# Patient Record
Sex: Female | Born: 1945
Health system: Southern US, Community
[De-identification: ages and names within clinical notes are randomized; demographics above are authoritative.]

## PROBLEM LIST (undated history)

## (undated) DIAGNOSIS — R112 Nausea with vomiting, unspecified: Secondary | ICD-10-CM

## (undated) DIAGNOSIS — I10 Essential (primary) hypertension: Secondary | ICD-10-CM

## (undated) DIAGNOSIS — R011 Cardiac murmur, unspecified: Secondary | ICD-10-CM

## (undated) DIAGNOSIS — I2510 Atherosclerotic heart disease of native coronary artery without angina pectoris: Secondary | ICD-10-CM

## (undated) DIAGNOSIS — F909 Attention-deficit hyperactivity disorder, unspecified type: Secondary | ICD-10-CM

## (undated) DIAGNOSIS — IMO0001 Reserved for inherently not codable concepts without codable children: Secondary | ICD-10-CM

## (undated) DIAGNOSIS — F419 Anxiety disorder, unspecified: Secondary | ICD-10-CM

## (undated) DIAGNOSIS — E785 Hyperlipidemia, unspecified: Secondary | ICD-10-CM

## (undated) DIAGNOSIS — Z9889 Other specified postprocedural states: Secondary | ICD-10-CM

## (undated) DIAGNOSIS — M1990 Unspecified osteoarthritis, unspecified site: Secondary | ICD-10-CM

## (undated) DIAGNOSIS — J45909 Unspecified asthma, uncomplicated: Secondary | ICD-10-CM

## (undated) DIAGNOSIS — G4730 Sleep apnea, unspecified: Secondary | ICD-10-CM

## (undated) DIAGNOSIS — H349 Unspecified retinal vascular occlusion: Secondary | ICD-10-CM

## (undated) DIAGNOSIS — I6529 Occlusion and stenosis of unspecified carotid artery: Secondary | ICD-10-CM

## (undated) DIAGNOSIS — H269 Unspecified cataract: Secondary | ICD-10-CM

## (undated) DIAGNOSIS — G709 Myoneural disorder, unspecified: Secondary | ICD-10-CM

## (undated) DIAGNOSIS — E042 Nontoxic multinodular goiter: Secondary | ICD-10-CM

## (undated) DIAGNOSIS — Z794 Long term (current) use of insulin: Secondary | ICD-10-CM

## (undated) DIAGNOSIS — K279 Peptic ulcer, site unspecified, unspecified as acute or chronic, without hemorrhage or perforation: Secondary | ICD-10-CM

## (undated) DIAGNOSIS — C4491 Basal cell carcinoma of skin, unspecified: Secondary | ICD-10-CM

## (undated) DIAGNOSIS — E119 Type 2 diabetes mellitus without complications: Secondary | ICD-10-CM

## (undated) DIAGNOSIS — D649 Anemia, unspecified: Secondary | ICD-10-CM

## (undated) DIAGNOSIS — I350 Nonrheumatic aortic (valve) stenosis: Secondary | ICD-10-CM

## (undated) DIAGNOSIS — E7800 Pure hypercholesterolemia, unspecified: Secondary | ICD-10-CM

## (undated) HISTORY — PX: ESOPHAGOGASTRODUODENOSCOPY: SHX1529

## (undated) HISTORY — PX: CATARACT EXTRACTION W/ INTRAOCULAR LENS  IMPLANT, BILATERAL: SHX1307

## (undated) HISTORY — PX: CORONARY ANGIOPLASTY: SHX604

## (undated) HISTORY — PX: EYE SURGERY: SHX253

## (undated) HISTORY — PX: CORONARY STENT PLACEMENT: SHX1402

## (undated) HISTORY — PX: COLONOSCOPY: SHX174

## (undated) HISTORY — PX: PARTIAL HYSTERECTOMY: SHX80

## (undated) HISTORY — PX: ABDOMINAL HYSTERECTOMY: SHX81

## (undated) HISTORY — PX: TRIGGER FINGER RELEASE: SHX641

## (undated) HISTORY — PX: BREAST EXCISIONAL BIOPSY: SUR124

---
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
## 2000-12-27 HISTORY — PX: BREAST BIOPSY: SHX20

## 2010-01-24 ENCOUNTER — Emergency Department: Payer: Self-pay | Admitting: Emergency Medicine

## 2010-03-04 ENCOUNTER — Ambulatory Visit: Payer: Self-pay | Admitting: Internal Medicine

## 2010-03-10 ENCOUNTER — Ambulatory Visit: Payer: Self-pay | Admitting: Internal Medicine

## 2010-03-20 ENCOUNTER — Emergency Department: Payer: Self-pay | Admitting: Emergency Medicine

## 2010-04-06 ENCOUNTER — Emergency Department (HOSPITAL_COMMUNITY): Admission: EM | Admit: 2010-04-06 | Discharge: 2010-04-07 | Payer: Self-pay | Admitting: Emergency Medicine

## 2010-08-24 ENCOUNTER — Ambulatory Visit: Payer: Self-pay | Admitting: Gastroenterology

## 2010-08-26 LAB — PATHOLOGY REPORT

## 2011-05-11 ENCOUNTER — Ambulatory Visit: Payer: Self-pay | Admitting: Ophthalmology

## 2011-09-27 ENCOUNTER — Ambulatory Visit: Payer: Self-pay | Admitting: Internal Medicine

## 2011-10-13 ENCOUNTER — Ambulatory Visit: Payer: Self-pay | Admitting: Internal Medicine

## 2012-06-06 ENCOUNTER — Ambulatory Visit: Payer: Self-pay | Admitting: Cardiology

## 2012-06-06 DIAGNOSIS — I219 Acute myocardial infarction, unspecified: Secondary | ICD-10-CM

## 2012-06-06 LAB — CK TOTAL AND CKMB (NOT AT ARMC): CK, Total: 79 U/L (ref 21–215)

## 2012-06-07 LAB — BASIC METABOLIC PANEL
Anion Gap: 8 (ref 7–16)
BUN: 11 mg/dL (ref 7–18)
Calcium, Total: 8.3 mg/dL — ABNORMAL LOW (ref 8.5–10.1)
EGFR (African American): >60
Glucose: 169 mg/dL — ABNORMAL HIGH (ref 65–99)
Osmolality: 285 (ref 275–301)
Potassium: 4.2 mmol/L (ref 3.5–5.1)
Sodium: 141 mmol/L (ref 136–145)

## 2012-11-03 ENCOUNTER — Ambulatory Visit: Payer: Self-pay | Admitting: Family Medicine

## 2012-11-30 LAB — COMPREHENSIVE METABOLIC PANEL
BUN: 28 mg/dL — ABNORMAL HIGH (ref 7–18)
Calcium, Total: 8.7 mg/dL (ref 8.5–10.1)
EGFR (African American): >60
Glucose: 195 mg/dL — ABNORMAL HIGH (ref 65–99)
Potassium: 3.9 mmol/L (ref 3.5–5.1)
SGOT(AST): 23 U/L (ref 15–37)
SGPT (ALT): 21 U/L (ref 12–78)
Total Protein: 7.4 g/dL (ref 6.4–8.2)

## 2012-11-30 LAB — CBC
HCT: 25.9 % — ABNORMAL LOW (ref 35.0–47.0)
MCHC: 32.1 g/dL (ref 32.0–36.0)
Platelet: 331 10*3/uL (ref 150–440)
RDW: 19.9 % — ABNORMAL HIGH (ref 11.5–14.5)
WBC: 8.2 10*3/uL (ref 3.6–11.0)

## 2012-12-01 ENCOUNTER — Inpatient Hospital Stay: Payer: Self-pay | Admitting: Specialist

## 2012-12-01 LAB — BASIC METABOLIC PANEL
Calcium, Total: 8.8 mg/dL (ref 8.5–10.1)
Chloride: 104 mmol/L (ref 98–107)
Co2: 29 mmol/L (ref 21–32)
EGFR (Non-African Amer.): >60
Glucose: 130 mg/dL — ABNORMAL HIGH (ref 65–99)
Osmolality: 283 (ref 275–301)
Potassium: 3.6 mmol/L (ref 3.5–5.1)
Sodium: 139 mmol/L (ref 136–145)

## 2012-12-01 LAB — CBC WITH DIFFERENTIAL/PLATELET
Basophil #: 0.1 10*3/uL (ref 0.0–0.1)
Basophil #: 0 10*3/uL (ref 0.0–0.1)
Basophil %: 0.9 %
Eosinophil #: 0.4 10*3/uL (ref 0.0–0.7)
HCT: 24.9 % — ABNORMAL LOW (ref 35.0–47.0)
HGB: 8 g/dL — ABNORMAL LOW (ref 12.0–16.0)
Lymphocyte #: 1.7 10*3/uL (ref 1.0–3.6)
Lymphocyte #: 1.1 10*3/uL (ref 1.0–3.6)
MCH: 27.4 pg (ref 26.0–34.0)
MCH: 27 pg (ref 26.0–34.0)
MCHC: 32.3 g/dL (ref 32.0–36.0)
MCV: 85 fL (ref 80–100)
MCV: 85 fL (ref 80–100)
Monocyte #: 0.6 x10 3/mm (ref 0.2–0.9)
Monocyte #: 0.4 x10 3/mm (ref 0.2–0.9)
Neutrophil #: 3.7 10*3/uL (ref 1.4–6.5)
Platelet: 296 10*3/uL (ref 150–440)
RDW: 19.7 % — ABNORMAL HIGH (ref 11.5–14.5)
RDW: 19.5 % — ABNORMAL HIGH (ref 11.5–14.5)

## 2012-12-01 LAB — PROTIME-INR: Prothrombin Time: 14 secs (ref 11.5–14.7)

## 2012-12-01 LAB — TROPONIN I
Troponin-I: 0.1 ng/mL — ABNORMAL HIGH
Troponin-I: 0.07 ng/mL — ABNORMAL HIGH

## 2012-12-01 LAB — IRON AND TIBC
Iron Saturation: 6 %
Unbound Iron-Bind.Cap.: 310 ug/dL

## 2012-12-01 LAB — OCCULT BLOOD X 1 CARD TO LAB, STOOL: Occult Blood, Feces: POSITIVE

## 2012-12-02 LAB — CBC WITH DIFFERENTIAL/PLATELET
Basophil #: 0.1 10*3/uL (ref 0.0–0.1)
Basophil %: 1 %
Eosinophil #: 0.5 10*3/uL (ref 0.0–0.7)
HGB: 9.8 g/dL — ABNORMAL LOW (ref 12.0–16.0)
Lymphocyte #: 1.5 10*3/uL (ref 1.0–3.6)
Lymphocyte %: 25.6 %
MCH: 28.3 pg (ref 26.0–34.0)
Monocyte %: 9.8 %
Neutrophil #: 3.2 10*3/uL (ref 1.4–6.5)
Neutrophil %: 55.2 %
Platelet: 273 10*3/uL (ref 150–440)
RBC: 3.45 10*6/uL — ABNORMAL LOW (ref 3.80–5.20)
WBC: 5.7 10*3/uL (ref 3.6–11.0)

## 2012-12-03 LAB — CBC WITH DIFFERENTIAL/PLATELET
Eosinophil %: 9.8 %
Lymphocyte #: 1.6 10*3/uL (ref 1.0–3.6)
Lymphocyte %: 33.7 %
MCV: 86 fL (ref 80–100)
Monocyte #: 0.5 x10 3/mm (ref 0.2–0.9)
Monocyte %: 10.2 %
Neutrophil #: 2.2 10*3/uL (ref 1.4–6.5)
Platelet: 256 10*3/uL (ref 150–440)
RBC: 3.56 10*6/uL — ABNORMAL LOW (ref 3.80–5.20)
WBC: 4.8 10*3/uL (ref 3.6–11.0)

## 2012-12-18 ENCOUNTER — Ambulatory Visit: Payer: Self-pay | Admitting: Family Medicine

## 2013-01-09 ENCOUNTER — Observation Stay: Payer: Self-pay | Admitting: Cardiology

## 2013-01-09 LAB — BASIC METABOLIC PANEL
Anion Gap: 4 — ABNORMAL LOW (ref 7–16)
BUN: 13 mg/dL (ref 7–18)
Chloride: 105 mmol/L (ref 98–107)
Co2: 29 mmol/L (ref 21–32)
Creatinine: 0.75 mg/dL (ref 0.60–1.30)
EGFR (African American): >60
EGFR (Non-African Amer.): >60
Glucose: 174 mg/dL — ABNORMAL HIGH (ref 65–99)
Osmolality: 280 (ref 275–301)
Potassium: 4 mmol/L (ref 3.5–5.1)

## 2013-01-09 LAB — CBC
HCT: 28 % — ABNORMAL LOW (ref 35.0–47.0)
MCH: 25 pg — ABNORMAL LOW (ref 26.0–34.0)
MCV: 83 fL (ref 80–100)
Platelet: 305 10*3/uL (ref 150–440)
RBC: 3.38 10*6/uL — ABNORMAL LOW (ref 3.80–5.20)
RDW: 21.1 % — ABNORMAL HIGH (ref 11.5–14.5)

## 2013-01-09 LAB — IRON AND TIBC
Iron Bind.Cap.(Total): 339 ug/dL (ref 250–450)
Iron: 18 ug/dL — ABNORMAL LOW (ref 50–170)
Unbound Iron-Bind.Cap.: 321 ug/dL

## 2013-01-09 LAB — TROPONIN I
Troponin-I: <0.02 ng/mL
Troponin-I: <0.02 ng/mL

## 2013-01-09 LAB — COMPREHENSIVE METABOLIC PANEL
Albumin: 3.7 g/dL (ref 3.4–5.0)
Alkaline Phosphatase: 91 U/L (ref 50–136)
Bilirubin,Total: 0.4 mg/dL (ref 0.2–1.0)
SGOT(AST): 15 U/L (ref 15–37)
SGPT (ALT): 18 U/L (ref 12–78)
Total Protein: 7.8 g/dL (ref 6.4–8.2)

## 2013-01-09 LAB — CK TOTAL AND CKMB (NOT AT ARMC)
CK, Total: 125 U/L (ref 21–215)
CK-MB: 1 ng/mL (ref 0.5–3.6)

## 2013-01-09 LAB — PROTIME-INR: INR: 0.9

## 2013-01-10 LAB — CBC WITH DIFFERENTIAL/PLATELET
Eosinophil %: 9.7 %
HCT: 26.7 % — ABNORMAL LOW (ref 35.0–47.0)
HGB: 8.7 g/dL — ABNORMAL LOW (ref 12.0–16.0)
Lymphocyte %: 27.6 %
MCH: 26.6 pg (ref 26.0–34.0)
MCHC: 32.4 g/dL (ref 32.0–36.0)
Monocyte #: 0.6 x10 3/mm (ref 0.2–0.9)
Monocyte %: 11.9 %
Neutrophil #: 2.3 10*3/uL (ref 1.4–6.5)
Platelet: 286 10*3/uL (ref 150–440)
WBC: 4.7 10*3/uL (ref 3.6–11.0)

## 2013-01-10 LAB — TROPONIN I: Troponin-I: <0.02 ng/mL

## 2013-02-12 ENCOUNTER — Ambulatory Visit: Payer: Self-pay | Admitting: Internal Medicine

## 2013-02-12 LAB — CBC CANCER CENTER
Basophil #: 0.1 x10 3/mm (ref 0.0–0.1)
Basophil %: 1.3 %
Basophil: 2 %
Eosinophil #: 0.3 x10 3/mm (ref 0.0–0.7)
Eosinophil %: 4.8 %
Eosinophil: 3 %
HGB: 9.4 g/dL — ABNORMAL LOW (ref 12.0–16.0)
Lymphocyte #: 1.6 x10 3/mm (ref 1.0–3.6)
Lymphocyte %: 26.1 %
Lymphocytes: 21 %
MCH: 25 pg — ABNORMAL LOW (ref 26.0–34.0)
MCV: 79 fL — ABNORMAL LOW (ref 80–100)
Monocyte #: 0.5 x10 3/mm (ref 0.2–0.9)
Monocyte %: 7.9 %
Neutrophil %: 59.9 %
Platelet: 342 x10 3/mm (ref 150–440)
RBC: 3.75 10*6/uL — ABNORMAL LOW (ref 3.80–5.20)
RDW: 16.7 % — ABNORMAL HIGH (ref 11.5–14.5)
Segmented Neutrophils: 62 %
Variant Lymphocyte: 4 %

## 2013-02-12 LAB — IRON AND TIBC
Iron Bind.Cap.(Total): 331 ug/dL (ref 250–450)
Iron Saturation: 6 %
Iron: 21 ug/dL — ABNORMAL LOW (ref 50–170)
Unbound Iron-Bind.Cap.: 310 ug/dL

## 2013-02-12 LAB — LACTATE DEHYDROGENASE: LDH: 180 U/L (ref 81–246)

## 2013-02-12 LAB — RETICULOCYTES: Absolute Retic Count: 0.0394 10*6/uL (ref 0.023–0.096)

## 2013-02-15 LAB — URINE IEP, RANDOM

## 2013-02-24 ENCOUNTER — Ambulatory Visit: Payer: Self-pay | Admitting: Internal Medicine

## 2013-03-27 ENCOUNTER — Ambulatory Visit: Payer: Self-pay | Admitting: Internal Medicine

## 2013-04-02 ENCOUNTER — Inpatient Hospital Stay: Payer: Self-pay | Admitting: Internal Medicine

## 2013-04-02 LAB — BASIC METABOLIC PANEL
BUN: 14 mg/dL (ref 7–18)
Calcium, Total: 8.8 mg/dL (ref 8.5–10.1)
Co2: 29 mmol/L (ref 21–32)
Creatinine: 0.58 mg/dL — ABNORMAL LOW (ref 0.60–1.30)
EGFR (African American): >60
EGFR (Non-African Amer.): >60
Glucose: 114 mg/dL — ABNORMAL HIGH (ref 65–99)
Osmolality: 281 (ref 275–301)
Potassium: 3.9 mmol/L (ref 3.5–5.1)
Sodium: 140 mmol/L (ref 136–145)

## 2013-04-02 LAB — PROTIME-INR
INR: 1
Prothrombin Time: 13.4 secs (ref 11.5–14.7)

## 2013-04-02 LAB — CBC
HCT: 24.7 % — ABNORMAL LOW (ref 35.0–47.0)
HGB: 7.8 g/dL — ABNORMAL LOW (ref 12.0–16.0)
MCH: 28.4 pg (ref 26.0–34.0)
MCHC: 31.7 g/dL — ABNORMAL LOW (ref 32.0–36.0)
MCV: 90 fL (ref 80–100)
Platelet: 337 10*3/uL (ref 150–440)

## 2013-04-02 LAB — HEMOGLOBIN: HGB: 7.4 g/dL — ABNORMAL LOW (ref 12.0–16.0)

## 2013-04-02 LAB — HEPATIC FUNCTION PANEL A (ARMC)
Albumin: 3.8 g/dL (ref 3.4–5.0)
Alkaline Phosphatase: 99 U/L (ref 50–136)
Bilirubin,Total: 0.3 mg/dL (ref 0.2–1.0)
SGOT(AST): 22 U/L (ref 15–37)
SGPT (ALT): 23 U/L (ref 12–78)
Total Protein: 6.8 g/dL (ref 6.4–8.2)

## 2013-04-02 LAB — TROPONIN I: Troponin-I: <0.02 ng/mL

## 2013-04-03 LAB — URINALYSIS, COMPLETE
Bilirubin,UR: NEGATIVE
Glucose,UR: NEGATIVE mg/dL (ref 0–75)
Ketone: NEGATIVE
Ph: 6 (ref 4.5–8.0)
Specific Gravity: 1.016 (ref 1.003–1.030)
Squamous Epithelial: 1

## 2013-04-03 LAB — CBC WITH DIFFERENTIAL/PLATELET
Eosinophil #: 0.3 10*3/uL (ref 0.0–0.7)
HCT: 22.2 % — ABNORMAL LOW (ref 35.0–47.0)
HGB: 7.2 g/dL — ABNORMAL LOW (ref 12.0–16.0)
Lymphocyte #: 1.2 10*3/uL (ref 1.0–3.6)
Lymphocyte %: 28.6 %
MCH: 29.1 pg (ref 26.0–34.0)
MCHC: 32.4 g/dL (ref 32.0–36.0)
MCV: 90 fL (ref 80–100)
Monocyte %: 9.7 %
Neutrophil %: 54.6 %
Platelet: 288 10*3/uL (ref 150–440)
RDW: 17.1 % — ABNORMAL HIGH (ref 11.5–14.5)

## 2013-04-03 LAB — BASIC METABOLIC PANEL
Anion Gap: 4 — ABNORMAL LOW (ref 7–16)
Calcium, Total: 8 mg/dL — ABNORMAL LOW (ref 8.5–10.1)
Creatinine: 0.78 mg/dL (ref 0.60–1.30)
Glucose: 88 mg/dL (ref 65–99)
Osmolality: 280 (ref 275–301)

## 2013-04-03 LAB — IRON AND TIBC
Iron Bind.Cap.(Total): 288 ug/dL (ref 250–450)
Iron Saturation: 9 %
Unbound Iron-Bind.Cap.: 262 ug/dL

## 2013-04-03 LAB — FOLATE: Folic Acid: 43.5 ng/mL (ref 3.1–100.0)

## 2013-04-03 LAB — RETICULOCYTES
Absolute Retic Count: 0.1618 10*6/uL
Reticulocyte: 6.2 % — ABNORMAL HIGH

## 2013-04-04 LAB — HEMOGLOBIN: HGB: 9.4 g/dL — ABNORMAL LOW (ref 12.0–16.0)

## 2013-04-10 ENCOUNTER — Ambulatory Visit: Payer: Self-pay | Admitting: Internal Medicine

## 2013-04-13 ENCOUNTER — Emergency Department (HOSPITAL_COMMUNITY): Payer: Medicare Other

## 2013-04-13 ENCOUNTER — Inpatient Hospital Stay (HOSPITAL_COMMUNITY)
Admission: EM | Admit: 2013-04-13 | Discharge: 2013-04-18 | DRG: 378 | Disposition: A | Discharge status: Home / Self Care | Payer: Medicare Other | Attending: Internal Medicine | Admitting: Internal Medicine

## 2013-04-13 DIAGNOSIS — K922 Gastrointestinal hemorrhage, unspecified: Secondary | ICD-10-CM | POA: Diagnosis present

## 2013-04-13 DIAGNOSIS — IMO0001 Reserved for inherently not codable concepts without codable children: Secondary | ICD-10-CM

## 2013-04-13 DIAGNOSIS — Z888 Allergy status to other drugs, medicaments and biological substances status: Secondary | ICD-10-CM

## 2013-04-13 DIAGNOSIS — I10 Essential (primary) hypertension: Secondary | ICD-10-CM | POA: Diagnosis present

## 2013-04-13 DIAGNOSIS — I2489 Other forms of acute ischemic heart disease: Secondary | ICD-10-CM | POA: Diagnosis present

## 2013-04-13 DIAGNOSIS — Z79899 Other long term (current) drug therapy: Secondary | ICD-10-CM

## 2013-04-13 DIAGNOSIS — I2510 Atherosclerotic heart disease of native coronary artery without angina pectoris: Secondary | ICD-10-CM | POA: Diagnosis present

## 2013-04-13 DIAGNOSIS — I248 Other forms of acute ischemic heart disease: Secondary | ICD-10-CM | POA: Diagnosis present

## 2013-04-13 DIAGNOSIS — Z794 Long term (current) use of insulin: Secondary | ICD-10-CM

## 2013-04-13 DIAGNOSIS — D649 Anemia, unspecified: Secondary | ICD-10-CM

## 2013-04-13 DIAGNOSIS — R079 Chest pain, unspecified: Secondary | ICD-10-CM | POA: Diagnosis present

## 2013-04-13 DIAGNOSIS — I209 Angina pectoris, unspecified: Secondary | ICD-10-CM

## 2013-04-13 DIAGNOSIS — Z882 Allergy status to sulfonamides status: Secondary | ICD-10-CM

## 2013-04-13 DIAGNOSIS — E119 Type 2 diabetes mellitus without complications: Secondary | ICD-10-CM | POA: Diagnosis present

## 2013-04-13 DIAGNOSIS — Z880 Allergy status to penicillin: Secondary | ICD-10-CM

## 2013-04-13 DIAGNOSIS — D500 Iron deficiency anemia secondary to blood loss (chronic): Secondary | ICD-10-CM | POA: Diagnosis present

## 2013-04-13 DIAGNOSIS — Z9861 Coronary angioplasty status: Secondary | ICD-10-CM

## 2013-04-13 DIAGNOSIS — E7800 Pure hypercholesterolemia, unspecified: Secondary | ICD-10-CM | POA: Diagnosis present

## 2013-04-13 HISTORY — DX: Type 2 diabetes mellitus without complications: E11.9

## 2013-04-13 HISTORY — DX: Pure hypercholesterolemia, unspecified: E78.00

## 2013-04-13 HISTORY — DX: Long term (current) use of insulin: Z79.4

## 2013-04-13 HISTORY — DX: Reserved for inherently not codable concepts without codable children: IMO0001

## 2013-04-13 HISTORY — DX: Nontoxic multinodular goiter: E04.2

## 2013-04-13 HISTORY — DX: Essential (primary) hypertension: I10

## 2013-04-13 HISTORY — DX: Atherosclerotic heart disease of native coronary artery without angina pectoris: I25.10

## 2013-04-13 LAB — CBC WITH DIFFERENTIAL/PLATELET
Basophils Relative: 1 % (ref 0–1)
Eosinophils Absolute: 0.3 10*3/uL (ref 0.0–0.7)
MCH: 29.9 pg (ref 26.0–34.0)
MCHC: 32.9 g/dL (ref 30.0–36.0)
Neutrophils Relative %: 60 % (ref 43–77)
Platelets: 279 10*3/uL (ref 150–400)

## 2013-04-13 LAB — COMPREHENSIVE METABOLIC PANEL
ALT: 14 U/L (ref 0–35)
Albumin: 3.2 g/dL — ABNORMAL LOW (ref 3.5–5.2)
Alkaline Phosphatase: 78 U/L (ref 39–117)
BUN: 18 mg/dL (ref 6–23)
Potassium: 3.9 mEq/L (ref 3.5–5.1)
Sodium: 141 mEq/L (ref 135–145)
Total Protein: 6.3 g/dL (ref 6.0–8.3)

## 2013-04-13 NOTE — ED Notes (Signed)
Pt st's she started having upper chest pain radiating into both arms at 20:00.  Pt also st's she is anemic and they have not found were the blood loss is coming from.  St's on Wed. Hgb was 8.  Pt denies any nausea, or vomiting. Color pale.

## 2013-04-13 NOTE — ED Provider Notes (Addendum)
History     CSN: [id number]  Arrival date & time 04/13/13  2221   First MD Initiated Contact with Patient 04/13/13 2257      Chief Complaint  Patient presents with  . Chest Pain    (Consider location/radiation/quality/duration/timing/severity/associated sxs/prior treatment) HPI Comments: Pt with hx of iddm, acute anemia in the recent past requiring transfusion, and negative EGD and Colonoscopy, getting treated with iron injections, comes in with cc of chest pain. Pt has a substernal chest pain that started this evening with exertion. There is no associated nausea, diaphoresis. Pt also has some exertional dyspnea and dizziness. No fevers, chills cough. Has hx of recent transfusion. Denies gi bleed, and had colonoscopy and EGD that were negative during that admission.   The history is provided by the patient.    No past medical history on file.  No past surgical history on file.  No family history on file.  History  Substance Use Topics  . Smoking status: Not on file  . Smokeless tobacco: Not on file  . Alcohol Use: Not on file    OB History   No data available      Review of Systems  Constitutional: Positive for fatigue. Negative for activity change.  HENT: Negative for facial swelling and neck pain.   Respiratory: Positive for shortness of breath. Negative for cough and wheezing.   Cardiovascular: Positive for chest pain.  Gastrointestinal: Negative for nausea, vomiting, abdominal pain, diarrhea, constipation, blood in stool and abdominal distention.  Genitourinary: Negative for dysuria, hematuria and difficulty urinating.  Skin: Negative for color change.  Neurological: Positive for dizziness. Negative for speech difficulty and headaches.  Hematological: Does not bruise/bleed easily.  Psychiatric/Behavioral: Negative for confusion.    Allergies  Review of patient's allergies indicates not on file.  Home Medications   Current Outpatient Rx  Name  Route  Sig   Dispense  Refill  . ascorbic acid (VITAMIN C) 500 MG tablet   Oral   Take 500 mg by mouth daily.         Marland Kitchen aspirin EC 81 MG tablet   Oral   Take 81 mg by mouth daily.         . ferrous sulfate 325 (65 FE) MG tablet   Oral   Take 325 mg by mouth daily with breakfast.         . GARLIC PO   Oral   Take 1 capsule by mouth daily.         . insulin glargine (LANTUS) 100 UNIT/ML injection   Subcutaneous   Inject into the skin at bedtime.         . metoprolol tartrate (LOPRESSOR) 25 MG tablet   Oral   Take 25 mg by mouth 2 (two) times daily.         . pantoprazole (PROTONIX) 40 MG tablet   Oral   Take 40 mg by mouth daily.         . pravastatin (PRAVACHOL) 40 MG tablet   Oral   Take 40 mg by mouth daily.         . promethazine (PHENERGAN) 25 MG tablet   Oral   Take 25 mg by mouth every 6 (six) hours as needed for nausea.         Marland Kitchen senna (SENOKOT) 8.6 MG TABS   Oral   Take 1 tablet by mouth daily.           BP 183/69  Pulse 84  Temp(Src) 98.2 F (36.8 C) (Oral)  Resp 16  Ht 5\' 3"  (1.6 m)  Wt 203 lb (92.08 kg)  BMI 35.97 kg/m2  SpO2 100%  Physical Exam  Nursing note and vitals reviewed. Constitutional: She is oriented to person, place, and time. She appears well-developed and well-nourished.  HENT:  Head: Normocephalic and atraumatic.  Eyes: EOM are normal. Pupils are equal, round, and reactive to light. No scleral icterus.  Neck: Neck supple. No JVD present.  Cardiovascular: Normal rate and regular rhythm.   Murmur heard. Pulmonary/Chest: Effort normal. No respiratory distress.  Abdominal: Soft. She exhibits no distension. There is no tenderness. There is no rebound and no guarding.  Neurological: She is alert and oriented to person, place, and time.  Skin: Skin is warm and dry.    ED Course  Procedures (including critical care time)  Labs Reviewed  CBC WITH DIFFERENTIAL  COMPREHENSIVE METABOLIC PANEL   No results found.   No  diagnosis found.    MDM   Date: 04/13/2013  Rate: 88  Rhythm: normal sinus rhythm  QRS Axis: normal  Intervals: normal  ST/T Wave abnormalities: nonspecific ST/T changes  Conduction Disutrbances:none  Narrative Interpretation:   Old EKG Reviewed: none available  Differential diagnosis includes: ACS syndrome CHF exacerbation Valvular disorder Myocarditis Pericarditis Pericardial effusion Pneumonia Pleural effusion Pulmonary edema PE Anemia Musculoskeletal pain   Pt comes in with cc of chest pain. Recent admission for anemia - negative workup at that time on the GI side. She has exertional dyspnea and chest pain. At this time i am thinking that this is a supply related ischemia - if the troponin is elevated. Initial EKG shows no STEMI and she is chest pin free at rest.  Will give ASA, as i dont suspect a GI source with recent negative scopes.   Derwood Kaplan, MD 04/14/13 0110   1:41 AM Symptomatic anemia, with hb < 7. Will start transfusion and admit.   CRITICAL CARE Performed by: Derwood Kaplan   Total critical care time: 31 minutes  Critical care time was exclusive of separately billable procedures and treating other patients.  Critical care was necessary to treat or prevent imminent or life-threatening deterioration.  Critical care was time spent personally by me on the following activities: development of treatment plan with patient and/or surrogate as well as nursing, discussions with consultants, evaluation of patient's response to treatment, examination of patient, obtaining history from patient or surrogate, ordering and performing treatments and interventions, ordering and review of laboratory studies, ordering and review of radiographic studies, pulse oximetry and re-evaluation of patient's condition.   Derwood Kaplan, MD 04/14/13 1610  Derwood Kaplan, MD 04/14/13 9604

## 2013-04-14 ENCOUNTER — Encounter (HOSPITAL_COMMUNITY): Payer: Self-pay | Admitting: Internal Medicine

## 2013-04-14 DIAGNOSIS — IMO0001 Reserved for inherently not codable concepts without codable children: Secondary | ICD-10-CM

## 2013-04-14 DIAGNOSIS — K922 Gastrointestinal hemorrhage, unspecified: Principal | ICD-10-CM

## 2013-04-14 DIAGNOSIS — I10 Essential (primary) hypertension: Secondary | ICD-10-CM | POA: Diagnosis present

## 2013-04-14 DIAGNOSIS — Z794 Long term (current) use of insulin: Secondary | ICD-10-CM

## 2013-04-14 DIAGNOSIS — E119 Type 2 diabetes mellitus without complications: Secondary | ICD-10-CM

## 2013-04-14 DIAGNOSIS — R079 Chest pain, unspecified: Secondary | ICD-10-CM | POA: Diagnosis present

## 2013-04-14 DIAGNOSIS — I209 Angina pectoris, unspecified: Secondary | ICD-10-CM

## 2013-04-14 DIAGNOSIS — I2510 Atherosclerotic heart disease of native coronary artery without angina pectoris: Secondary | ICD-10-CM | POA: Diagnosis present

## 2013-04-14 DIAGNOSIS — D500 Iron deficiency anemia secondary to blood loss (chronic): Secondary | ICD-10-CM

## 2013-04-14 LAB — GLUCOSE, CAPILLARY
Glucose-Capillary: 73 mg/dL (ref 70–99)
Glucose-Capillary: 112 mg/dL — ABNORMAL HIGH (ref 70–99)
Glucose-Capillary: 115 mg/dL — ABNORMAL HIGH (ref 70–99)

## 2013-04-14 LAB — BASIC METABOLIC PANEL
Calcium: 8.3 mg/dL — ABNORMAL LOW (ref 8.4–10.5)
Chloride: 109 mEq/L (ref 96–112)
Creatinine, Ser: 0.56 mg/dL (ref 0.50–1.10)
GFR calc Af Amer: >90 mL/min (ref >90)
Sodium: 142 mEq/L (ref 135–145)

## 2013-04-14 LAB — CBC
MCV: 88.3 fL (ref 78.0–100.0)
Platelets: 241 10*3/uL (ref 150–400)
RDW: 16.9 % — ABNORMAL HIGH (ref 11.5–15.5)
WBC: 6.3 10*3/uL (ref 4.0–10.5)

## 2013-04-14 LAB — TROPONIN I
Troponin I: <0.3 ng/mL (ref <0.30)
Troponin I: <0.3 ng/mL (ref <0.30)

## 2013-04-14 MED ORDER — ACETAMINOPHEN 325 MG PO TABS
650.0000 mg | ORAL_TABLET | Freq: Four times a day (QID) | ORAL | Status: DC | PRN
  Administered 2013-04-14: 650 mg via ORAL
  Filled 2013-04-14: qty 2

## 2013-04-14 MED ORDER — SODIUM CHLORIDE 0.9 % IJ SOLN
3.0000 mL | Freq: Two times a day (BID) | INTRAMUSCULAR | Status: DC
  Administered 2013-04-14 – 2013-04-17 (×6): 3 mL via INTRAVENOUS

## 2013-04-14 MED ORDER — PANTOPRAZOLE SODIUM 40 MG PO TBEC
40.0000 mg | DELAYED_RELEASE_TABLET | Freq: Every day | ORAL | Status: DC
  Administered 2013-04-14 – 2013-04-18 (×5): 40 mg via ORAL
  Filled 2013-04-14 (×5): qty 1

## 2013-04-14 MED ORDER — ASPIRIN 81 MG PO CHEW
162.0000 mg | CHEWABLE_TABLET | Freq: Once | ORAL | Status: AC
  Administered 2013-04-14: 162 mg via ORAL
  Filled 2013-04-14: qty 4

## 2013-04-14 MED ORDER — INSULIN ASPART 100 UNIT/ML ~~LOC~~ SOLN
0.0000 [IU] | SUBCUTANEOUS | Status: DC
Start: 2013-04-14 — End: 2013-04-15
  Administered 2013-04-14: 4 [IU] via SUBCUTANEOUS
  Administered 2013-04-15: 7 [IU] via SUBCUTANEOUS
  Administered 2013-04-15: 3 [IU] via SUBCUTANEOUS
  Filled 2013-04-14: qty 4

## 2013-04-14 MED ORDER — SIMVASTATIN 20 MG PO TABS
20.0000 mg | ORAL_TABLET | Freq: Every day | ORAL | Status: DC
  Administered 2013-04-14 – 2013-04-17 (×4): 20 mg via ORAL
  Filled 2013-04-14 (×5): qty 1

## 2013-04-14 MED ORDER — PROMETHAZINE HCL 25 MG PO TABS: 25.0000 mg | ORAL_TABLET | Freq: Four times a day (QID) | ORAL | Status: DC | PRN

## 2013-04-14 MED ORDER — SODIUM CHLORIDE 0.9 % IV SOLN
INTRAVENOUS | Status: DC
  Administered 2013-04-14: 08:00:00 via INTRAVENOUS

## 2013-04-14 MED ORDER — METOPROLOL TARTRATE 25 MG PO TABS
25.0000 mg | ORAL_TABLET | Freq: Two times a day (BID) | ORAL | Status: DC
  Administered 2013-04-14 – 2013-04-16 (×6): 25 mg via ORAL
  Filled 2013-04-14 (×9): qty 1

## 2013-04-14 MED ORDER — FERROUS SULFATE 325 (65 FE) MG PO TABS
325.0000 mg | ORAL_TABLET | Freq: Every day | ORAL | Status: DC
  Administered 2013-04-14: 325 mg via ORAL
  Filled 2013-04-14 (×4): qty 1

## 2013-04-14 NOTE — H&P (Signed)
[facility] History and Physical  Micheale Schlack VHQ:[id number] DOB: [birth year] DOA: 04/13/2013  Referring physician: ED PCP: No primary provider on file.  Specialists: None  Chief Complaint: Chest pain  HPI: Joyce Potter is a 67 y.o. female h/o IDDM, acute anemia in recent past requiring transfusion with negative EGD, colonoscopy, and capsule study.  Treated with iron therapy, comes in with CC of CP.  Patient also has history of CAD in the past s/p stenting x1 last year.  Chest pain is substernal, worse with exertion, no associated nausea.  Has exertional dyspnea, no fevers or chills.  Does have guiac positive stool in recent past during hospital stay at [facility], but unclear bleeding source.  Today in ED HGB 6.9, patient being transfused 2 units, hospitalist asked to admit.  Review of Systems: 12 systems reviewed and otherwise negative.  Past Medical History  Diagnosis Date  . IDDM (insulin dependent diabetes mellitus)   . High cholesterol   . HTN (hypertension)   . Multiple thyroid nodules     Benign  . CAD (coronary artery disease)    Past Surgical History  Procedure Laterality Date  . Coronary stent placement    . Partial hysterectomy     Social History:  reports that she has never smoked. She does not have any smokeless tobacco history on file. She reports that she does not drink alcohol or use illicit drugs.   Allergies  Allergen Reactions  . Sulfa Antibiotics Other (See Comments)    "Got Drunk"  . Penicillins Rash  . Vicodin (Hydrocodone-Acetaminophen) Rash    Family History  Problem Relation Age of Onset  . Uterine cancer Mother   . Breast cancer Mother   . Colon cancer Neg Hx     Prior to Admission medications   Medication Sig Start Date End Date Taking? Authorizing Provider  ascorbic acid (VITAMIN C) 500 MG tablet Take 500 mg by mouth daily.   Yes Historical Provider, MD  aspirin EC 81 MG tablet Take 81 mg by mouth daily.   Yes Historical  Provider, MD  ferrous sulfate 325 (65 FE) MG tablet Take 325 mg by mouth daily with breakfast.   Yes Historical Provider, MD  GARLIC PO Take 1 capsule by mouth daily.   Yes Historical Provider, MD  insulin glargine (LANTUS) 100 UNIT/ML injection Inject 65 Units into the skin every morning.    Yes Historical Provider, MD  metoprolol tartrate (LOPRESSOR) 25 MG tablet Take 25 mg by mouth 2 (two) times daily.   Yes Historical Provider, MD  pantoprazole (PROTONIX) 40 MG tablet Take 40 mg by mouth daily.   Yes Historical Provider, MD  pravastatin (PRAVACHOL) 40 MG tablet Take 40 mg by mouth daily.   Yes Historical Provider, MD  promethazine (PHENERGAN) 25 MG tablet Take 25 mg by mouth every 6 (six) hours as needed for nausea.   Yes Historical Provider, MD  senna (SENOKOT) 8.6 MG TABS Take 1 tablet by mouth daily.   Yes Historical Provider, MD   Physical Exam: Filed Vitals:   04/13/13 2242 04/14/13 0016 04/14/13 0100  BP: 183/69 131/53 124/50  Pulse: 84 79 76  Temp: 98.2 F (36.8 C)    TempSrc: Oral    Resp: 16 14 14   Height: 5\' 3"  (1.6 m)    Weight: 92.08 kg (203 lb)    SpO2: 100% 98% 97%    General:  NAD, resting comfortably in bed Eyes: PEERLA EOMI ENT: mucous membranes moist Neck: supple w/o JVD Cardiovascular:  RRR murmur present, 3/6 Respiratory: CTA B Abdomen: soft, nt, nd, bs+ Skin: no rash nor lesion Musculoskeletal: MAE, full ROM all 4 extremities Psychiatric: normal tone and affect Neurologic: AAOx3, grossly non-focal  Labs on Admission:  Basic Metabolic Panel:  Recent Labs Lab 04/13/13 2242  NA 141  K 3.9  CL 107  CO2 28  GLUCOSE 169*  BUN 18  CREATININE 0.61  CALCIUM 8.8   Liver Function Tests:  Recent Labs Lab 04/13/13 2242  AST 18  ALT 14  ALKPHOS 78  BILITOT 0.2*  PROT 6.3  ALBUMIN 3.2*   No results found for this basename: LIPASE, AMYLASE,  in the last 168 hours No results found for this basename: AMMONIA,  in the last 168 hours CBC:  Recent  Labs Lab 04/13/13 2242  WBC 6.2  NEUTROABS 3.7  HGB 6.9*  HCT 21.0*  MCV 90.9  PLT 279   Cardiac Enzymes: No results found for this basename: CKTOTAL, CKMB, CKMBINDEX, TROPONINI,  in the last 168 hours  BNP (last 3 results) No results found for this basename: PROBNP,  in the last 8760 hours CBG: No results found for this basename: GLUCAP,  in the last 168 hours  Radiological Exams on Admission: Dg Chest 2 View  04/13/2013  *RADIOLOGY REPORT*  Clinical Data: Dull chest pain.  CHEST - 2 VIEW  Comparison: None.  Findings: The lungs are well-aerated.  Mild chronic peribronchial thickening is noted.  There is no evidence of focal opacification, pleural effusion or pneumothorax.  The heart is borderline normal in size; the mediastinal contour is within normal limits.  No acute osseous abnormalities are seen.  IMPRESSION: Mild chronic peribronchial thickening noted; lungs otherwise grossly clear.   Original Report Authenticated By: Tonia Ghent, M.D.     EKG: Independently reviewed.  Assessment/Plan Principal Problem:   LGI bleed Active Problems:   Blood loss anemia   CAD (coronary artery disease)   IDDM (insulin dependent diabetes mellitus)   HTN (hypertension)   Chest pain   1. LGI bleed causing anemia of 6.9 (likely somewhat chronic), and likely resulting in chest pain with patients known underlying CAD - no evidence of ACS at this time, transfusing blood, treating as symptomatic anemia, likely needs GI consult in AM, unclear source of bleed.  Admit to tele, repeat CBC in AM 2. IDDM - holding home lantus since patient NPO except ice chips and using q4h high dose SSI. 3. HTN - continue home meds     Code Status: Full code (must indicate code status--if unknown or must be presumed, indicate so) Family Communication: Spoke with daughter at bedside (indicate person spoken with, if applicable, with phone number if by telephone) Disposition Plan: Admit to inpatient (indicate  anticipated LOS)  Time spent: 70 min  Cheyenna Pankowski M. [facility] Pager [phone]  If 7PM-7AM, please contact night-coverage [email or web address] Password [email or web address] 04/14/2013, 2:03 AM

## 2013-04-14 NOTE — Progress Notes (Signed)
MD paged at this time regarding pt's diet order; pt currently NPO except ice chips; will await callback.

## 2013-04-14 NOTE — Progress Notes (Signed)
Medical record release faxed to [facility] at this time.

## 2013-04-14 NOTE — Progress Notes (Signed)
Pt c/o ha; no PRN meds ordered; MD paged; will await callback.

## 2013-04-14 NOTE — Progress Notes (Addendum)
Patient seen and examined , States she has never seen bright red blood,just black tarry stools Negative workup a month ago at [facility]  Dr Dulce Sellar paged  Requesting CT as she is due to have one next week

## 2013-04-14 NOTE — ED Notes (Signed)
PT.'S DAUGHTER SIGNED CONSENT FOR BLOOD TRANSFUSION , PT. HAS NO OBJECTIONS ON BLOOD TRANSFUSION ORDER.

## 2013-04-14 NOTE — Progress Notes (Signed)
Carb mod diet ordered for pt; will cont. To monitor.

## 2013-04-14 NOTE — ED Notes (Addendum)
1ST UNIT PRBC COMPLETED WITH NO ADVERSE EFFECT , REPORT GIVEN TO 2000 UNIT NURSE , TRANSPORTED BY EMT IN STABLE CONDITION , RESPIRATIONS UNLABORED. IV SITE INTACT.

## 2013-04-14 NOTE — Consult Note (Signed)
[facility] Consultation Note  Referring Provider:  Richarda Overlie, [facility]  Reason for Consultation:  anemia  HPI: Joyce Potter is a 67 y.o. female admitted for chest pain and shortness of breath.  History of coronary artery disease with stenting, about one year ago. Since that time, she has had troubles with anemia.  About one year ago, required transfusion of two units blood.  Had endoscopy and colonoscopy in [place] around that time, normal per patient.  Over the past three months, her anemia worsened; at that time, she had another endoscopy and colonoscopy, as well as capsule endoscopy, by Dr. Lynnae Prude at [facility], reportedly normal; however, upon second look, family was told there might have been "dark spot" in "lower abdomen" that might have been originally missed.  Patient has dark stools ever since starting iron pills.  Has received 4 units of blood (2 at [place], 2 here) over the past ten days.  Stools at [facility] reportedly hemoccult-positive.   Past Medical History  Diagnosis Date  . IDDM (insulin dependent diabetes mellitus)   . High cholesterol   . HTN (hypertension)   . Multiple thyroid nodules     Benign  . CAD (coronary artery disease)     Past Surgical History  Procedure Laterality Date  . Coronary stent placement    . Partial hysterectomy      Prior to Admission medications   Medication Sig Start Date End Date Taking? Authorizing Provider  ascorbic acid (VITAMIN C) 500 MG tablet Take 500 mg by mouth daily.   Yes Historical Provider, MD  aspirin EC 81 MG tablet Take 81 mg by mouth daily.   Yes Historical Provider, MD  ferrous sulfate 325 (65 FE) MG tablet Take 325 mg by mouth daily with breakfast.   Yes Historical Provider, MD  GARLIC PO Take 1 capsule by mouth daily.   Yes Historical Provider, MD  insulin glargine (LANTUS) 100 UNIT/ML injection Inject 65 Units into the skin every morning.    Yes Historical Provider, MD  metoprolol tartrate  (LOPRESSOR) 25 MG tablet Take 25 mg by mouth 2 (two) times daily.   Yes Historical Provider, MD  pantoprazole (PROTONIX) 40 MG tablet Take 40 mg by mouth daily.   Yes Historical Provider, MD  pravastatin (PRAVACHOL) 40 MG tablet Take 40 mg by mouth daily.   Yes Historical Provider, MD  promethazine (PHENERGAN) 25 MG tablet Take 25 mg by mouth every 6 (six) hours as needed for nausea.   Yes Historical Provider, MD  senna (SENOKOT) 8.6 MG TABS Take 1 tablet by mouth daily.   Yes Historical Provider, MD    Current Facility-Administered Medications  Medication Dose Route Frequency Provider Last Rate Last Dose  . 0.9 %  sodium chloride infusion   Intravenous Continuous Hillary Bow, DO 75 mL/hr at 04/14/13 0746    . ferrous sulfate tablet 325 mg  325 mg Oral Q breakfast Hillary Bow, DO   325 mg at 04/14/13 0757  . insulin aspart (novoLOG) injection 0-20 Units  0-20 Units Subcutaneous Q4H Hillary Bow, DO   4 Units at 04/14/13 0404  . metoprolol tartrate (LOPRESSOR) tablet 25 mg  25 mg Oral BID Hillary Bow, DO   25 mg at 04/14/13 1021  . pantoprazole (PROTONIX) EC tablet 40 mg  40 mg Oral Daily Hillary Bow, DO   40 mg at 04/14/13 1027  . promethazine (PHENERGAN) tablet 25 mg  25 mg Oral Q6H PRN Hillary Bow,  DO      . simvastatin (ZOCOR) tablet 20 mg  20 mg Oral q1800 Hillary Bow, DO      . sodium chloride 0.9 % injection 3 mL  3 mL Intravenous Q12H Hillary Bow, DO        Allergies as of 04/13/2013 - Review Complete 04/13/2013  Allergen Reaction Noted  . Sulfa antibiotics Other (See Comments) 04/13/2013  . Penicillins Rash 04/13/2013  . Vicodin (hydrocodone-acetaminophen) Rash 04/13/2013    Family History  Problem Relation Age of Onset  . Uterine cancer Mother   . Breast cancer Mother   . Colon cancer Neg Hx     History   Social History  . Marital Status: Widowed    Spouse Name: N/A    Number of Children: N/A  . Years of Education: N/A    Occupational History  . Not on file.   Social History Main Topics  . Smoking status: Never Smoker   . Smokeless tobacco: Not on file  . Alcohol Use: No  . Drug Use: No  . Sexually Active: Not on file   Other Topics Concern  . Not on file   Social History Narrative  . No narrative on file    Review of Systems: As per HPI; all others negative  Physical Exam: Vital signs in last 24 hours: Temp:  [97.9 F (36.6 C)-98.4 F (36.9 C)] 98.2 F (36.8 C) (04/19 0748) Pulse Rate:  [63-84] 65 (04/19 1023) Resp:  [13-20] 20 (04/19 0748) BP: (107-183)/(45-75) 120/70 mmHg (04/19 1023) SpO2:  [95 %-100 %] 98 % (04/19 0748) Weight:  [88.5 kg (195 lb 1.7 oz)-92.08 kg (203 lb)] 88.5 kg (195 lb 1.7 oz) (04/19 0444) Last BM Date: 04/13/13  General:   Alert,  Somewhat pale-appearing; Well-developed, well-nourished, pleasant and cooperative in NAD Head:  Normocephalic and atraumatic. Eyes:  Sclera clear, no icterus.   Conjunctiva pale. Ears:  Normal auditory acuity. Nose:  No deformity, discharge,  or lesions. Mouth:  No deformity or lesions.  Oropharynx pale and somewhat dry. Neck:  Supple; no masses or thyromegaly. Lungs:  Clear throughout to auscultation.   No wheezes, crackles, or rhonchi. No acute distress. Heart:  Regular rate and rhythm; no murmurs, clicks, rubs,  or gallops. Abdomen:  Soft, nontender and nondistended. No masses, hepatosplenomegaly or hernias noted. Normal bowel sounds, without guarding, and without rebound.     Msk:  Symmetrical without gross deformities. Normal posture. Pulses:  Normal pulses noted. Extremities:  Without clubbing or edema. Neurologic:  Alert and  oriented x4;  Diffusely weak, otherwise grossly normal neurologically. Skin:  Occasional ecchymoses; otherwise intact without significant lesions or rashes. Psych:  Alert and cooperative. Normal mood and affect.   Lab Results:  Recent Labs  04/13/13 2242 04/14/13 0900  WBC 6.2 6.3  HGB 6.9*  8.7*  HCT 21.0* 26.5*  PLT 279 241   BMET  Recent Labs  04/13/13 2242 04/14/13 0900  NA 141 142  K 3.9 3.5  CL 107 109  CO2 28 26  GLUCOSE 169* 75  BUN 18 18  CREATININE 0.61 0.56  CALCIUM 8.8 8.3*   LFT  Recent Labs  04/13/13 2242  PROT 6.3  ALBUMIN 3.2*  AST 18  ALT 14  ALKPHOS 78  BILITOT 0.2*   PT/INR No results found for this basename: LABPROT, INR,  in the last 72 hours  Studies/Results: Dg Chest 2 View  04/13/2013  *RADIOLOGY REPORT*  Clinical Data: Dull chest pain.  CHEST - 2 VIEW  Comparison: None.  Findings: The lungs are well-aerated.  Mild chronic peribronchial thickening is noted.  There is no evidence of focal opacification, pleural effusion or pneumothorax.  The heart is borderline normal in size; the mediastinal contour is within normal limits.  No acute osseous abnormalities are seen.  IMPRESSION: Mild chronic peribronchial thickening noted; lungs otherwise grossly clear.   Original Report Authenticated By: Tonia Ghent, M.D.     Impression:  1.  Progressive anemia with work-up at [facility].  Extensive recent evaluation, including endoscopy, colonoscopy, capsule endoscopy.  Dark stools likely from iron.  Unclear if this is obscure occult bleeding or whether there might be another non-GI tract related cause (e.g., bone marrow issue). 2.  Hemoccult-positive stool.  Unclear if this indicates ongoing GI tract losses (obscure occult bleeding) or whether this is from hemorrhoids or some other GI tract source that may not necessarily be cause of her ongoing anemia.  Plan:  1.  I have told patient and her family (husband, daughter) that I will need to review her outside records and discuss case with Dr. Markham Jordan.  Based on records review, and my discussion with Dr. Mechele Collin, we can then gauge next best step in management.  Patient and her family are aware that I won't be able to discuss with Dr. Mechele Collin until Monday. 2.  I have counseled patient that we  could pursue evaluation in expedited fashion as outpatient.  However, given her need for 4 units of blood within the past 10 days, she is reluctant to go home.  She wants to proceed with inpatient evaluation. 3.  Hemoccult stools. 4.  Serial CBCs, transfusions as needed, PPI. 5.  Will revisit Monday, hopefully after I've been able to discuss case with Dr. Mechele Collin.   LOS: 1 day   Ac Colan M  04/14/2013, 11:58 AM

## 2013-04-15 LAB — GLUCOSE, CAPILLARY
Glucose-Capillary: 227 mg/dL — ABNORMAL HIGH (ref 70–99)
Glucose-Capillary: 106 mg/dL — ABNORMAL HIGH (ref 70–99)

## 2013-04-15 LAB — CBC
HCT: 25.6 % — ABNORMAL LOW (ref 36.0–46.0)
Hemoglobin: 8.7 g/dL — ABNORMAL LOW (ref 12.0–15.0)
MCH: 30.4 pg (ref 26.0–34.0)
RBC: 2.86 MIL/uL — ABNORMAL LOW (ref 3.87–5.11)

## 2013-04-15 LAB — TYPE AND SCREEN
ABO/RH(D): O POS
Antibody Screen: NEGATIVE
Unit division: 0

## 2013-04-15 MED ORDER — INSULIN ASPART 100 UNIT/ML ~~LOC~~ SOLN
0.0000 [IU] | Freq: Every day | SUBCUTANEOUS | Status: DC
  Administered 2013-04-16: 2 [IU] via SUBCUTANEOUS

## 2013-04-15 MED ORDER — INSULIN ASPART 100 UNIT/ML ~~LOC~~ SOLN
0.0000 [IU] | Freq: Three times a day (TID) | SUBCUTANEOUS | Status: DC
  Administered 2013-04-16: 2 [IU] via SUBCUTANEOUS
  Administered 2013-04-17: 3 [IU] via SUBCUTANEOUS
  Administered 2013-04-18: 4 [IU] via SUBCUTANEOUS
  Administered 2013-04-18: 3 [IU] via SUBCUTANEOUS

## 2013-04-15 NOTE — Progress Notes (Addendum)
[facility] PROGRESS NOTE  Joyce Potter ZOX:[id number] DOB: 06-Jul-1946 DOA: 04/13/2013 PCP: No primary provider on file.  Assessment/Plan: Principal Problem:   LGI bleed Active Problems:   Blood loss anemia   CAD (coronary artery disease)   IDDM (insulin dependent diabetes mellitus)   HTN (hypertension)   Chest pain     LGI bleed causing anemia of 8.7 post transfusion (likely somewhat chronic), and likely resulting in chest pain with patients known underlying CAD - no evidence of ACS at this time, transfusing blood, treating as symptomatic anemia, Unclear if this indicates ongoing GI tract losses (obscure occult bleeding) or whether this is from hemorrhoids  Patient and her family are aware that DR Dulce Sellar won't be able to discuss with Dr. Mechele Collin until Monday    IDDM - holding home lantus  ,continue SSI  HTN - continue home meds      Code Status: full Family Communication: family updated about patient's clinical progress Disposition Plan:  As above    Brief narrative: Joyce Potter is a 67 y.o. female admitted for chest pain and shortness of breath. History of coronary artery disease with stenting, about one year ago. Since that time, she has had troubles with anemia. About one year ago, required transfusion of two units blood. Had endoscopy and colonoscopy in [place] around that time, normal per patient. Over the past three months, her anemia worsened; at that time, she had another endoscopy and colonoscopy, as well as capsule endoscopy, by Dr. Lynnae Prude at [facility] clinic, reportedly normal; however, upon second look, family was told there might have been "dark spot" in "lower abdomen" that might have been originally missed. Patient has dark stools ever since starting iron pills. Has received 4 units of blood (2 at [place], 2 here) over the past ten days. Stools at [place] reportedly hemoccult-positive    HPI/Subjective: No bleeding overnight ,guiac  positive stools  Objective: Filed Vitals:   04/14/13 1023 04/14/13 1401 04/14/13 2035 04/15/13 0446  BP: 120/70 135/50 121/41 133/48  Pulse: 65 68 66 65  Temp:  97.6 F (36.4 C) 98.4 F (36.9 C) 98.4 F (36.9 C)  TempSrc:  Axillary Oral Oral  Resp:  18 19 20   Height:      Weight:      SpO2:  97% 98% 97%    Intake/Output Summary (Last 24 hours) at 04/15/13 1035 Last data filed at 04/14/13 1635  Gross per 24 hour  Intake 973.75 ml  Output      0 ml  Net 973.75 ml    Exam:  General: Alert, Somewhat pale-appearing; Well-developed, well-nourished, pleasant and cooperative in NAD  Head: Normocephalic and atraumatic.  Eyes: Sclera clear, no icterus. Conjunctiva pale.  Ears: Normal auditory acuity.  Nose: No deformity, discharge, or lesions.  Mouth: No deformity or lesions. Oropharynx pale and somewhat dry.  Neck: Supple; no masses or thyromegaly.  Lungs: Clear throughout to auscultation. No wheezes, crackles, or rhonchi. No acute distress.  Heart: Regular rate and rhythm; no murmurs, clicks, rubs, or gallops.  Abdomen: Soft, nontender and nondistended. No masses, hepatosplenomegaly or hernias noted. Normal bowel sounds, without guarding, and without rebound.  Msk: Symmetrical without gross deformities. Normal posture.  Pulses: Normal pulses noted.  Extremities: Without clubbing or edema.  Neurologic: Alert and oriented x4; Diffusely weak, otherwise grossly normal neurologically.  Skin: Occasional ecchymoses; otherwise intact without significant lesions or rashes.    Data Reviewed: Basic Metabolic Panel:  Recent Labs Lab 04/13/13 2242 04/14/13 0900  NA  141 142  K 3.9 3.5  CL 107 109  CO2 28 26  GLUCOSE 169* 75  BUN 18 18  CREATININE 0.61 0.56  CALCIUM 8.8 8.3*    Liver Function Tests:  Recent Labs Lab 04/13/13 2242  AST 18  ALT 14  ALKPHOS 78  BILITOT 0.2*  PROT 6.3  ALBUMIN 3.2*   No results found for this basename: LIPASE, AMYLASE,  in the last 168  hours No results found for this basename: AMMONIA,  in the last 168 hours  CBC:  Recent Labs Lab 04/13/13 2242 04/14/13 0900  WBC 6.2 6.3  NEUTROABS 3.7  --   HGB 6.9* 8.7*  HCT 21.0* 26.5*  MCV 90.9 88.3  PLT 279 241    Cardiac Enzymes:  Recent Labs Lab 04/14/13 0900 04/14/13 1320  TROPONINI <0.30 <0.30   BNP (last 3 results) No results found for this basename: PROBNP,  in the last 8760 hours   CBG:  Recent Labs Lab 04/14/13 0759 04/14/13 1136 04/14/13 1616 04/14/13 2050 04/15/13 0559  GLUCAP 73 73 112* 115* 143*    No results found for this or any previous visit (from the past 240 hour(s)).   Studies: Dg Chest 2 View  04/13/2013  *RADIOLOGY REPORT*  Clinical Data: Dull chest pain.  CHEST - 2 VIEW  Comparison: None.  Findings: The lungs are well-aerated.  Mild chronic peribronchial thickening is noted.  There is no evidence of focal opacification, pleural effusion or pneumothorax.  The heart is borderline normal in size; the mediastinal contour is within normal limits.  No acute osseous abnormalities are seen.  IMPRESSION: Mild chronic peribronchial thickening noted; lungs otherwise grossly clear.   Original Report Authenticated By: Tonia Ghent, M.D.     Scheduled Meds: . ferrous sulfate  325 mg Oral Q breakfast  . insulin aspart  0-20 Units Subcutaneous Q4H  . metoprolol tartrate  25 mg Oral BID  . pantoprazole  40 mg Oral Daily  . simvastatin  20 mg Oral q1800  . sodium chloride  3 mL Intravenous Q12H   Continuous Infusions:   Principal Problem:   LGI bleed Active Problems:   Blood loss anemia   CAD (coronary artery disease)   IDDM (insulin dependent diabetes mellitus)   HTN (hypertension)   Chest pain    Time spent: 40 minutes   [facility]  [facility] Pager [phone]. If 8PM-8AM, please contact night-coverage at [email or web address], password [facility] 04/15/2013, 10:35 AM  LOS: 2 days

## 2013-04-16 ENCOUNTER — Encounter (HOSPITAL_COMMUNITY): Payer: Self-pay | Admitting: Radiology

## 2013-04-16 ENCOUNTER — Inpatient Hospital Stay (HOSPITAL_COMMUNITY): Payer: Medicare Other

## 2013-04-16 LAB — GLUCOSE, CAPILLARY
Glucose-Capillary: 133 mg/dL — ABNORMAL HIGH (ref 70–99)
Glucose-Capillary: 117 mg/dL — ABNORMAL HIGH (ref 70–99)

## 2013-04-16 LAB — CBC
HCT: 26.4 % — ABNORMAL LOW (ref 36.0–46.0)
Platelets: 229 10*3/uL (ref 150–400)
RDW: 17.6 % — ABNORMAL HIGH (ref 11.5–15.5)
WBC: 4.6 10*3/uL (ref 4.0–10.5)

## 2013-04-16 MED ORDER — IOHEXOL 300 MG/ML  SOLN
100.0000 mL | Freq: Once | INTRAMUSCULAR | Status: AC | PRN
  Administered 2013-04-16: 100 mL via INTRAVENOUS

## 2013-04-16 NOTE — Progress Notes (Addendum)
[facility] PROGRESS NOTE  Joyce Potter ZOX:[id number] DOB: 1946-10-10 DOA: 04/13/2013 PCP: No primary provider on file.  Assessment/Plan: Principal Problem:   LGI bleed Active Problems:   Blood loss anemia   CAD (coronary artery disease)   IDDM (insulin dependent diabetes mellitus)   HTN (hypertension)   Chest pain     LGI bleed causing anemia of 8.9 post transfusion (likely somewhat chronic), and likely resulting in chest pain with patients known underlying CAD - no evidence of ACS at this time, transfusing blood, treating as symptomatic anemia, Unclear if this indicates ongoing GI tract losses (obscure occult bleeding) or whether this is from hemorrhoids  Patient and her family are aware that DR Dulce Sellar won't be able to discuss with Dr. Mechele Collin until Monday  ,may need a BM bx IF NEGATIVE GI WORK UP  IDDM - holding home lantus ,continue SSI  HTN - continue home meds   Code Status: full  Family Communication: family updated about patient's clinical progress  Disposition Plan: awating GI recommendations    Brief narrative:  Joyce Potter is a 67 y.o. female admitted for chest pain and shortness of breath. History of coronary artery disease with stenting, about one year ago. Since that time, she has had troubles with anemia. About one year ago, required transfusion of two units blood. Had endoscopy and colonoscopy in [place] around that time, normal per patient. Over the past three months, her anemia worsened; at that time, she had another endoscopy and colonoscopy, as well as capsule endoscopy, by Dr. Lynnae Prude at [facility] clinic, reportedly normal; however, upon second look, family was told there might have been "dark spot" in "lower abdomen" that might have been originally missed. Patient has dark stools ever since starting iron pills. Has received 4 units of blood (2 at [place], 2 here) over the past ten days. Stools at [place] reportedly hemoccult-positive   HPI/Subjective:  No bleeding overnight ,guiac positive stools      Objective: Filed Vitals:   04/15/13 0446 04/15/13 1244 04/15/13 1932 04/16/13 0451  BP: 133/48 145/51 136/45 120/53  Pulse: 65 73 66 65  Temp: 98.4 F (36.9 C) 98.4 F (36.9 C) 98.4 F (36.9 C) 97.7 F (36.5 C)  TempSrc: Oral Oral Oral Oral  Resp: 20 16 18 16   Height:      Weight:      SpO2: 97% 99% 97% 98%    Intake/Output Summary (Last 24 hours) at 04/16/13 1052 Last data filed at 04/16/13 0936  Gross per 24 hour  Intake    123 ml  Output    300 ml  Net   -177 ml    Exam:  HENT:  Head: Atraumatic.  Nose: Nose normal.  Mouth/Throat: Oropharynx is clear and moist.  Eyes: Conjunctivae are normal. Pupils are equal, round, and reactive to light. No scleral icterus.  Neck: Neck supple. No tracheal deviation present.  Cardiovascular: Normal rate, regular rhythm, normal heart sounds and intact distal pulses.  Pulmonary/Chest: Effort normal and breath sounds normal. No respiratory distress.  Abdominal: Soft. Normal appearance and bowel sounds are normal. She exhibits no distension. There is no tenderness.  Musculoskeletal: She exhibits no edema and no tenderness.  Neurological: She is alert. No cranial nerve deficit.    Data Reviewed: Basic Metabolic Panel:  Recent Labs Lab 04/13/13 2242 04/14/13 0900  NA 141 142  K 3.9 3.5  CL 107 109  CO2 28 26  GLUCOSE 169* 75  BUN 18 18  CREATININE 0.61 0.56  CALCIUM 8.8 8.3*    Liver Function Tests:  Recent Labs Lab 04/13/13 2242  AST 18  ALT 14  ALKPHOS 78  BILITOT 0.2*  PROT 6.3  ALBUMIN 3.2*   No results found for this basename: LIPASE, AMYLASE,  in the last 168 hours No results found for this basename: AMMONIA,  in the last 168 hours  CBC:  Recent Labs Lab 04/13/13 2242 04/14/13 0900 04/15/13 1045 04/16/13 0540  WBC 6.2 6.3 5.7 4.6  NEUTROABS 3.7  --   --   --   HGB 6.9* 8.7* 8.7* 8.9*  HCT 21.0* 26.5* 25.6* 26.4*  MCV 90.9  88.3 89.5 89.2  PLT 279 241 256 229    Cardiac Enzymes:  Recent Labs Lab 04/14/13 0900 04/14/13 1320  TROPONINI <0.30 <0.30   BNP (last 3 results) No results found for this basename: PROBNP,  in the last 8760 hours   CBG:  Recent Labs Lab 04/15/13 0559 04/15/13 1122 04/15/13 1634 04/15/13 2106 04/16/13 0552  GLUCAP 143* 227* 106* 119* 133*    No results found for this or any previous visit (from the past 240 hour(s)).   Studies: Dg Chest 2 View  04/13/2013  *RADIOLOGY REPORT*  Clinical Data: Dull chest pain.  CHEST - 2 VIEW  Comparison: None.  Findings: The lungs are well-aerated.  Mild chronic peribronchial thickening is noted.  There is no evidence of focal opacification, pleural effusion or pneumothorax.  The heart is borderline normal in size; the mediastinal contour is within normal limits.  No acute osseous abnormalities are seen.  IMPRESSION: Mild chronic peribronchial thickening noted; lungs otherwise grossly clear.   Original Report Authenticated By: Tonia Ghent, M.D.     Scheduled Meds: . ferrous sulfate  325 mg Oral Q breakfast  . insulin aspart  0-20 Units Subcutaneous TID WC  . insulin aspart  0-5 Units Subcutaneous QHS  . metoprolol tartrate  25 mg Oral BID  . pantoprazole  40 mg Oral Daily  . simvastatin  20 mg Oral q1800  . sodium chloride  3 mL Intravenous Q12H   Continuous Infusions:   Principal Problem:   LGI bleed Active Problems:   Blood loss anemia   CAD (coronary artery disease)   IDDM (insulin dependent diabetes mellitus)   HTN (hypertension)   Chest pain    Time spent: 40 minutes   [facility]  [facility] Pager [phone]. If 8PM-8AM, please contact night-coverage at [email or web address], password [facility] 04/16/2013, 10:52 AM  LOS: 3 days

## 2013-04-16 NOTE — Progress Notes (Signed)
Subjective: No pain. No overt blood in stool  Objective: Vital signs in last 24 hours: Temp:  [97.7 F (36.5 C)-98.4 F (36.9 C)] 97.7 F (36.5 C) (04/21 0451) Pulse Rate:  [65-66] 65 (04/21 0451) Resp:  [16-18] 16 (04/21 0451) BP: (120-136)/(45-53) 120/53 mmHg (04/21 0451) SpO2:  [97 %-98 %] 98 % (04/21 0451) Weight change:  Last BM Date: 04/15/13  PE: GEN:  Less pale-appearing (after receiving 2 units blood). HEENT:  Pale conjunctiva. ABD:  Soft  Lab Results: CBC    Component Value Date/Time   WBC 4.6 04/16/2013 0540   RBC 2.96* 04/16/2013 0540   HGB 8.9* 04/16/2013 0540   HCT 26.4* 04/16/2013 0540   PLT 229 04/16/2013 0540   MCV 89.2 04/16/2013 0540   MCH 30.1 04/16/2013 0540   MCHC 33.7 04/16/2013 0540   RDW 17.6* 04/16/2013 0540   LYMPHSABS 1.7 04/13/2013 2242   MONOABS 0.4 04/13/2013 2242   EOSABS 0.3 04/13/2013 2242   BASOSABS 0.1 04/13/2013 2242   Stool Guaiac positive  Assessment:  1.  Anemia, presumable due to obscure occult GI bleeding.  Unclear etiology.  Plan:  1.  I have discussed case in detail with Dr. Lynnae Prude, [facility].  Dr. Markham Jordan finds no obvious explanation for patient's occult blood loss anemia. 2.  Will proceed with CT enterography abdomen/pelvis with contrast today. 3.  If CT enterography is unrevealing, will repeat capsule study here. 4.  If all unrevealing, would obtain hematology consult to exclude extragastrointestinal sources of anemia (especially as patient's anemia is normocytic (iron panel useless at this point since she has been recent transfused)). 5.  Will follow; case discussed in detail with patient and her husband.  Freddy Jaksch 04/16/2013, 12:49 PM

## 2013-04-16 NOTE — Progress Notes (Signed)
Utilization review completed.  P[employer]ne Rosenstock,RN,BSN Case Manager [phone]  

## 2013-04-17 ENCOUNTER — Encounter (HOSPITAL_COMMUNITY)
Admission: EM | Disposition: A | Discharge status: Home / Self Care | Payer: Self-pay | Source: Home / Self Care | Attending: Internal Medicine

## 2013-04-17 HISTORY — PX: GIVENS CAPSULE STUDY: SHX5432

## 2013-04-17 LAB — CBC
HCT: 27.9 % — ABNORMAL LOW (ref 36.0–46.0)
MCHC: 32.6 g/dL (ref 30.0–36.0)
MCV: 90.9 fL (ref 78.0–100.0)
RDW: 17.1 % — ABNORMAL HIGH (ref 11.5–15.5)

## 2013-04-17 LAB — GLUCOSE, CAPILLARY: Glucose-Capillary: 102 mg/dL — ABNORMAL HIGH (ref 70–99)

## 2013-04-17 SURGERY — IMAGING PROCEDURE, GI TRACT, INTRALUMINAL, VIA CAPSULE
Anesthesia: LOCAL

## 2013-04-17 MED ORDER — METOPROLOL TARTRATE 25 MG PO TABS
25.0000 mg | ORAL_TABLET | Freq: Two times a day (BID) | ORAL | Status: DC
  Administered 2013-04-17 – 2013-04-18 (×2): 25 mg via ORAL
  Filled 2013-04-17 (×3): qty 1

## 2013-04-17 SURGICAL SUPPLY — 1 items: TOWEL COTTON PACK 4EA (MISCELLANEOUS) ×4 IMPLANT

## 2013-04-17 NOTE — Progress Notes (Signed)
Subjective: No events overnight. No overt bleeding.  Objective: Vital signs in last 24 hours: Temp:  [97.8 F (36.6 C)-98.5 F (36.9 C)] 98.3 F (36.8 C) (04/22 0735) Pulse Rate:  [63-79] 69 (04/22 0735) Resp:  [17-18] 18 (04/22 0735) BP: (81-139)/(55-80) 96/55 mmHg (04/22 0735) SpO2:  [93 %-98 %] 97 % (04/22 0735) Weight change:  Last BM Date: 04/16/13  PE: GEN:  NAD, somewhat pale-appearing  Lab Results: CBC    Component Value Date/Time   WBC 4.7 04/17/2013 0525   RBC 3.07* 04/17/2013 0525   HGB 9.1* 04/17/2013 0525   HCT 27.9* 04/17/2013 0525   PLT 244 04/17/2013 0525   MCV 90.9 04/17/2013 0525   MCH 29.6 04/17/2013 0525   MCHC 32.6 04/17/2013 0525   RDW 17.1* 04/17/2013 0525   LYMPHSABS 1.7 04/13/2013 2242   MONOABS 0.4 04/13/2013 2242   EOSABS 0.3 04/13/2013 2242   BASOSABS 0.1 04/13/2013 2242   CT enterography:  Negative for inflammatory thickening, mass.  Incidental notation of congenital malrotation without volvulus noted.  Assessment:  1.  Anemia with hemoccult-positive stools, presumably obscure occult GI bleeding.  However, I still can't rule out non-GI source (i.e., hematologic).  Plan:  1.  Capsule endoscopy today. 2. If Hgb stable tomorrow (it has been for the past several days), would d/c home tomorrow and I (as well as her hematologist in [place]) can follow-up with outpatient.  If she is discharged tomorrow, I would check CBC Friday to ensure interval stability.   Freddy Jaksch 04/17/2013, 9:33 AM

## 2013-04-17 NOTE — Progress Notes (Signed)
[facility] PROGRESS NOTE  Joyce Potter BJY:[id number] DOB: [birth year] DOA: 04/13/2013 PCP: No primary provider on file.  Brief narrative: Joyce Potter is an 67 y.o. female with a past medical history of insulin-dependent diabetes, recurrent anemia requiring blood transfusion in the past with a history of negative EGD and colonoscopy as well as capsule study, treated chronically with iron therapy, who presented to the hospital on 04/14/2013 the chief complaint of chest pain in the setting of a known history of coronary disease status post stenting. Upon initial evaluation emergency department, the patient had a hemoglobin of 6.9 mg/dL. She is being followed by GI with plans to repeat a capsule endoscopy today.  Assessment/Plan: Principal Problem:   GI bleed from an occult source with recurrent chronic blood loss anemia -History of similar problem approximately one year ago, evaluated by an endoscopy and colonoscopy which were normal. -Maintained on iron therapy. - Repeat endoscopy, colonoscopy and capsule endoscopy at the [facility], reportedly normal, although there was mention of a "dark spot "in the lower abdomen.  -Status post a total of 4 units of blood over the past 10 days. Hemoglobin stable for past 3 days. -Stools are Hemoccult positive.  -GI following with plans to repeat a capsule endoscopy.  -If hemoglobin is stable, plan to discharge home tomorrow and followup with her treating physicians for further evaluation.  -Aspirin on hold. Continue iron supplement.  Active Problems:   CAD (coronary artery disease) with chest pain secondary to questionable demand ischemia from anemia -Troponins negative. Chest pain may have been induced by transient demand ischemia.  -Continue to hold aspirin. -Continue statin therapy.   IDDM (insulin dependent diabetes mellitus) -Continue current therapy including Lantus and insulin resistant sliding scale insulin.  -CBGs 117-209.   HTN  (hypertension) -Controlled on metoprolol.  Code Status: Full. Family Communication: Husband and daughter updated at the bedside. Disposition Plan: Home in 24 hours if hemoglobin stable.   Medical Consultants:  Dr. Willis Modena, Gastroenterology  Other Consultants:  None.  Anti-infectives:  None.  HPI/Subjective: Joyce Potter feels a bit anxious about the fact that she has had multiple evaluations and no source of the bleeding has been identified. She says she felt terrible with dizziness and chest pain on her initial presentation and she is nervous about getting to this condition again. Denies any chest pain over the past 24 hours. No shortness of breath or dizziness. Bowels are moving. They are still dark in color. I had an extensive discussion with her and her family regarding the possible explanations for her ongoing anemia, causes of anemia, and her current plan of care.  Objective: Filed Vitals:   04/16/13 2121 04/16/13 2202 04/17/13 0533 04/17/13 0735  BP: 115/80 135/62 81/59 96/55   Pulse: 79 73 63 69  Temp: 98.1 F (36.7 C)  97.8 F (36.6 C) 98.3 F (36.8 C)  TempSrc: Oral  Oral Oral  Resp: 18  17 18   Height:      Weight:      SpO2: 98%  93% 97%    Intake/Output Summary (Last 24 hours) at 04/17/13 1003 Last data filed at 04/17/13 0865  Gross per 24 hour  Intake    240 ml  Output      0 ml  Net    240 ml    Exam: Gen:  NAD, anxious Cardiovascular:  RRR, No M/R/G Respiratory:  Lungs CTAB Gastrointestinal:  Abdomen soft, NT/ND, + BS Extremities:  No C/E/C  Data Reviewed: Basic Metabolic Panel:  Recent Labs Lab 04/13/13 2242 04/14/13 0900  NA 141 142  K 3.9 3.5  CL 107 109  CO2 28 26  GLUCOSE 169* 75  BUN 18 18  CREATININE 0.61 0.56  CALCIUM 8.8 8.3*   GFR Estimated Creatinine Clearance: 73.9 ml/min (by C-G formula based on Cr of 0.56). Liver Function Tests:  Recent Labs Lab 04/13/13 2242  AST 18  ALT 14  ALKPHOS 78  BILITOT 0.2*   PROT 6.3  ALBUMIN 3.2*   CBC:  Recent Labs Lab 04/13/13 2242 04/14/13 0900 04/15/13 1045 04/16/13 0540 04/17/13 0525  WBC 6.2 6.3 5.7 4.6 4.7  NEUTROABS 3.7  --   --   --   --   HGB 6.9* 8.7* 8.7* 8.9* 9.1*  HCT 21.0* 26.5* 25.6* 26.4* 27.9*  MCV 90.9 88.3 89.5 89.2 90.9  PLT 279 241 256 229 244   Cardiac Enzymes:  Recent Labs Lab 04/14/13 0900 04/14/13 1320  TROPONINI <0.30 <0.30   CBG:  Recent Labs Lab 04/16/13 0552 04/16/13 1132 04/16/13 1640 04/16/13 2123 04/17/13 0616  GLUCAP 133* 117* 139* 209* 158*    Procedures and Diagnostic Studies: Dg Chest 2 View  04/13/2013  *RADIOLOGY REPORT*  Clinical Data: Dull chest pain.  CHEST - 2 VIEW  Comparison: None.  Findings: The lungs are well-aerated.  Mild chronic peribronchial thickening is noted.  There is no evidence of focal opacification, pleural effusion or pneumothorax.  The heart is borderline normal in size; the mediastinal contour is within normal limits.  No acute osseous abnormalities are seen.  IMPRESSION: Mild chronic peribronchial thickening noted; lungs otherwise grossly clear.   Original Report Authenticated By: Tonia Ghent, M.D.    Ct Entero Abd/pelvis W/cm  04/16/2013  *RADIOLOGY REPORT*  Clinical Data:  Hemoccult positive stool.  Anemia.  Nausea. Constipation.  CT ABDOMEN AND PELVIS WITH CONTRAST (CT ENTEROGRAPHY)  Technique:  Multidetector CT of the abdomen and pelvis during bolus administration of intravenous contrast. Negative oral contrast VoLumen was given.  Contrast: OMNIPAQUE IOHEXOL 300 MG/ML  SOLN  Comparison:   None.  Findings: The small bowel shows suboptimal filling with negative oral contrast material, which is predominately in the stomach. However, there is no evidence of small bowel wall thickening, dilatation, or other inflammatory changes.  The region of the terminal ileum is unremarkable in appearance. Diverticulosis is noted, however there is no evidence of diverticulitis.  Prior  hysterectomy noted, however no adnexal masses are identified.  Liver, gallbladder, pancreas, adrenal glands, and kidneys are normal in appearance.  There is no evidence of hydronephrosis. Congenital malrotation of bowel is seen with small bowel located in the right upper quadrant and cecum seen in the mid abdomen, however there is no evidence of bowel obstruction or volvulus.  No soft tissue masses or inflammatory process identified.  No evidence of lymphadenopathy.  No abnormal fluid collections are identified.  IMPRESSION:  1.  No evidence of inflammatory bowel disease, mass, or other acute findings. 2.  Diverticulosis.  No radiographic evidence of diverticulitis. 3.  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   Original Report Authenticated By: Myles Rosenthal, M.D.     Scheduled Meds: . insulin aspart  0-20 Units Subcutaneous TID WC  . insulin aspart  0-5 Units Subcutaneous QHS  . metoprolol tartrate  25 mg Oral BID  . pantoprazole  40 mg Oral Daily  . simvastatin  20 mg Oral q1800  . sodium chloride  3 mL Intravenous Q12H  Continuous Infusions:   Time spent: 35 minutes    LOS: 4 days   Joyce Potter  [facility] Pager [phone].  If 8PM-8AM, please contact night-coverage at [email or web address], password [facility] 04/17/2013, 10:03 AM

## 2013-04-18 ENCOUNTER — Encounter (HOSPITAL_COMMUNITY): Payer: Self-pay | Admitting: Gastroenterology

## 2013-04-18 LAB — GLUCOSE, CAPILLARY
Glucose-Capillary: 141 mg/dL — ABNORMAL HIGH (ref 70–99)
Glucose-Capillary: 185 mg/dL — ABNORMAL HIGH (ref 70–99)

## 2013-04-18 LAB — HEMOGLOBIN AND HEMATOCRIT, BLOOD: HCT: 30.1 % — ABNORMAL LOW (ref 36.0–46.0)

## 2013-04-18 NOTE — Progress Notes (Signed)
Assessment unchanged. Discussed D/C instructions with pt and family. Verbalized understanding. No new RX. Tele removed. IV removed earlier in the day. Pt left via W/C accompanied by [employer].

## 2013-04-18 NOTE — Progress Notes (Signed)
Subjective: Completed capsule study. No voiced complaints.  Objective: Vital signs in last 24 hours: Temp:  [97.6 F (36.4 C)-98 F (36.7 C)] 97.8 F (36.6 C) (04/23 0750) Pulse Rate:  [63-80] 67 (04/23 0750) Resp:  [17-18] 18 (04/23 0750) BP: (115-135)/(51-60) 115/55 mmHg (04/23 0750) SpO2:  [94 %-97 %] 97 % (04/23 0750) Weight change:  Last BM Date: 04/16/13  PE: GEN:  Pale, NAD  Lab Results: CBC    Component Value Date/Time   WBC 4.7 04/17/2013 0525   RBC 3.07* 04/17/2013 0525   HGB 9.9* 04/18/2013 0600   HCT 30.1* 04/18/2013 0600   PLT 244 04/17/2013 0525   MCV 90.9 04/17/2013 0525   MCH 29.6 04/17/2013 0525   MCHC 32.6 04/17/2013 0525   RDW 17.1* 04/17/2013 0525   LYMPHSABS 1.7 04/13/2013 2242   MONOABS 0.4 04/13/2013 2242   EOSABS 0.3 04/13/2013 2242   BASOSABS 0.1 04/13/2013 2242   Studies/Results: Capsule results pending  Assessment:  1.  Anemia with hemoccult-positive stools.  Plan:  1.  Patient is without complaints and her Hgb is stable.  OK to discharge home. 2.  I will make arrangements to follow-up with her in the next 1-2 weeks.  I will make arrangements for CBC assessment at end of this week. 3.  Will sign-off; please call with questions; thank you for the consult.   Joyce Potter 04/18/2013, 8:53 AM

## 2013-04-18 NOTE — Discharge Summary (Signed)
Physician Discharge Summary  Joyce Potter ZOX:[id number] DOB: 14-Jul-1946 DOA: 04/13/2013  PCP: Jerl Mina, MD  Admit date: 04/13/2013 Discharge date: 04/18/2013  Recommendations for Outpatient Follow-up:  1. Recommend outpatient CBC checks every other day until hemoglobin stable. 2. Note: Instructed to stop aspirin for at least one week. Recommend followup with cardiologist to determine risk benefit ratio of ongoing aspirin therapy in the setting of recurrent GI bleeding. 3. Consider hematologic evaluation of bone marrow given ongoing problems with anemia which may be multifactorial. 4. Keep hemoglobin above 8 and transfuse as needed to this end.  Discharge Diagnoses:  Principal Problem:    GI bleed from an occult source with recurrent chronic blood loss anemia Active Problems:    Blood loss anemia    CAD (coronary artery disease) with demand ischemia from anemia    IDDM (insulin dependent diabetes mellitus)    HTN (hypertension)    Chest pain   Discharge Condition: Improved.  Diet recommendation: Low-sodium, heart healthy, carbohydrate modified.  History of present illness:  Joyce Potter is an 67 y.o. female with a past medical history of insulin-dependent diabetes, recurrent anemia requiring blood transfusion in the past with a history of negative EGD and colonoscopy as well as capsule study, treated chronically with iron therapy, who presented to the hospital on 04/14/2013 the chief complaint of chest pain in the setting of a known history of coronary disease status post stenting. Upon initial evaluation emergency department, the patient had a hemoglobin of 6.9 mg/dL.   Hospital Course by problem:  Principal Problem:  GI bleed from an occult source with recurrent chronic blood loss anemia  -History of similar problem approximately one year ago, evaluated by an endoscopy and colonoscopy which were normal.  -Maintained on iron therapy.  - Repeat endoscopy, colonoscopy  and capsule endoscopy at the [facility], reportedly normal, although there was mention of a "dark spot "in the lower abdomen.  -Status post a total of 4 units of blood over the past 10 days. Hemoglobin stable for past 4 days.  -Stools are Hemoccult positive.  -GI following, and underwent a repeat capsule endoscopy study on 04/17/2013.  Instructed to followup with Dr. Dulce Sellar for the results of this. -Aspirin on hold (instructed to hold for at least one additional week). Continue iron supplement.  Active Problems:  CAD (coronary artery disease) with chest pain secondary to questionable demand ischemia from anemia  -Troponins negative. Chest pain may have been induced by transient demand ischemia.  -Continue to hold aspirin.  -Continue statin therapy.  IDDM (insulin dependent diabetes mellitus)  -Continue current therapy including Lantus and insulin resistant sliding scale insulin.  -CBGs 117-209.  HTN (hypertension)  -Controlled on metoprolol.  Procedures:  Capsule endoscopy 04/17/2013: Results pending.  Consultations:  Dr. Willis Modena, Gastroenterology  Discharge Exam: Filed Vitals:   04/18/13 0750  BP: 115/55  Pulse: 67  Temp: 97.8 F (36.6 C)  Resp: 18   Filed Vitals:   04/17/13 1349 04/17/13 2039 04/18/13 0504 04/18/13 0750  BP: 135/60 120/57 117/53 115/55  Pulse: 77 80 63 67  Temp: 98 F (36.7 C) 98 F (36.7 C) 97.6 F (36.4 C) 97.8 F (36.6 C)  TempSrc: Oral Oral Oral Oral  Resp: 17 18 18 18   Height:      Weight:      SpO2: 95% 94% 97% 97%    Gen:  NAD Cardiovascular:  RRR, No M/R/G Respiratory: Lungs CTAB Gastrointestinal: Abdomen soft, NT/ND with normal active bowel sounds. Extremities: No C/E/C  Discharge Instructions  Discharge Orders   Future Orders Complete By Expires     Call MD for:  difficulty breathing, headache or visual disturbances  As directed     Call MD for:  extreme fatigue  As directed     Call MD for:  persistant dizziness or  light-headedness  As directed     Call MD for:  persistant nausea and vomiting  As directed     Diet - low sodium heart healthy  As directed     Diet Carb Modified  As directed     Discharge instructions  As directed     Comments:      Have your PCP check your blood counts at least every other day until stable, then at his discretion.    Increase activity slowly  As directed         Medication List    STOP taking these medications       aspirin EC 81 MG tablet      TAKE these medications       ascorbic acid 500 MG tablet  Commonly known as:  VITAMIN C  Take 500 mg by mouth daily.     ferrous sulfate 325 (65 FE) MG tablet  Take 325 mg by mouth daily with breakfast.     GARLIC PO  Take 1 capsule by mouth daily.     insulin glargine 100 UNIT/ML injection  Commonly known as:  LANTUS  Inject 65 Units into the skin every morning.     metoprolol tartrate 25 MG tablet  Commonly known as:  LOPRESSOR  Take 25 mg by mouth 2 (two) times daily.     pantoprazole 40 MG tablet  Commonly known as:  PROTONIX  Take 40 mg by mouth daily.     pravastatin 40 MG tablet  Commonly known as:  PRAVACHOL  Take 40 mg by mouth daily.     promethazine 25 MG tablet  Commonly known as:  PHENERGAN  Take 25 mg by mouth every 6 (six) hours as needed for nausea.     senna 8.6 MG Tabs  Commonly known as:  SENOKOT  Take 1 tablet by mouth daily.           Follow-up Information   Follow up with HEDRICK,JAMES, MD. (Every other day for a check of your blood counts.)    Contact information:   [address]   [place] [address] [phone]        The results of significant diagnostics from this hospitalization (including imaging, microbiology, ancillary and laboratory) are listed below for reference.    Significant Diagnostic Studies: Dg Chest 2 View  04/13/2013  *RADIOLOGY REPORT*  Clinical Data: Dull chest pain.  CHEST - 2 VIEW  Comparison: None.  Findings: The lungs are  well-aerated.  Mild chronic peribronchial thickening is noted.  There is no evidence of focal opacification, pleural effusion or pneumothorax.  The heart is borderline normal in size; the mediastinal contour is within normal limits.  No acute osseous abnormalities are seen.  IMPRESSION: Mild chronic peribronchial thickening noted; lungs otherwise grossly clear.   Original Report Authenticated By: Tonia Ghent, M.D.    Ct Entero Abd/pelvis W/cm  04/16/2013  *RADIOLOGY REPORT*  Clinical Data:  Hemoccult positive stool.  Anemia.  Nausea. Constipation.  CT ABDOMEN AND PELVIS WITH CONTRAST (CT ENTEROGRAPHY)  Technique:  Multidetector CT of the abdomen and pelvis during bolus administration of intravenous contrast. Negative oral contrast VoLumen was given.  Contrast: OMNIPAQUE IOHEXOL 300 MG/ML  SOLN  Comparison:   None.  Findings: The small bowel shows suboptimal filling with negative oral contrast material, which is predominately in the stomach. However, there is no evidence of small bowel wall thickening, dilatation, or other inflammatory changes.  The region of the terminal ileum is unremarkable in appearance. Diverticulosis is noted, however there is no evidence of diverticulitis.  Prior hysterectomy noted, however no adnexal masses are identified.  Liver, gallbladder, pancreas, adrenal glands, and kidneys are normal in appearance.  There is no evidence of hydronephrosis. Congenital malrotation of bowel is seen with small bowel located in the right upper quadrant and cecum seen in the mid abdomen, however there is no evidence of bowel obstruction or volvulus.  No soft tissue masses or inflammatory process identified.  No evidence of lymphadenopathy.  No abnormal fluid collections are identified.  IMPRESSION:  1.  No evidence of inflammatory bowel disease, mass, or other acute findings. 2.  Diverticulosis.  No radiographic evidence of diverticulitis. 3.  Congenital malrotation of bowel, without evidence of  obstruction or volvulus.   Original Report Authenticated By: Myles Rosenthal, M.D.     Labs:  Basic Metabolic Panel:  Recent Labs Lab 04/13/13 2242 04/14/13 0900  NA 141 142  K 3.9 3.5  CL 107 109  CO2 28 26  GLUCOSE 169* 75  BUN 18 18  CREATININE 0.61 0.56  CALCIUM 8.8 8.3*   GFR Estimated Creatinine Clearance: 73.9 ml/min (by C-G formula based on Cr of 0.56). Liver Function Tests:  Recent Labs Lab 04/13/13 2242  AST 18  ALT 14  ALKPHOS 78  BILITOT 0.2*  PROT 6.3  ALBUMIN 3.2*   CBC:  Recent Labs Lab 04/13/13 2242 04/14/13 0900 04/15/13 1045 04/16/13 0540 04/17/13 0525 04/18/13 0600  WBC 6.2 6.3 5.7 4.6 4.7  --   NEUTROABS 3.7  --   --   --   --   --   HGB 6.9* 8.7* 8.7* 8.9* 9.1* 9.9*  HCT 21.0* 26.5* 25.6* 26.4* 27.9* 30.1*  MCV 90.9 88.3 89.5 89.2 90.9  --   PLT 279 241 256 229 244  --    Cardiac Enzymes:  Recent Labs Lab 04/14/13 0900 04/14/13 1320  TROPONINI <0.30 <0.30   CBG:  Recent Labs Lab 04/17/13 0616 04/17/13 1132 04/17/13 1633 04/17/13 2037 04/18/13 0619  GLUCAP 158* 114* 102* 138* 141*    Time coordinating discharge: 35 minutes.  Signed:  RAMA,CHRISTINA  Pager [phone] [facility] 04/18/2013, 9:48 AM

## 2013-04-26 ENCOUNTER — Ambulatory Visit: Payer: Self-pay | Admitting: Internal Medicine

## 2013-05-03 ENCOUNTER — Inpatient Hospital Stay: Payer: Self-pay | Admitting: Student

## 2013-05-03 LAB — URINALYSIS, COMPLETE
Blood: NEGATIVE
Glucose,UR: NEGATIVE mg/dL (ref 0–75)
Ketone: NEGATIVE
Nitrite: NEGATIVE
Ph: 7 (ref 4.5–8.0)
Protein: NEGATIVE
RBC,UR: NONE SEEN /HPF (ref 0–5)
Specific Gravity: 1.006 (ref 1.003–1.030)
Squamous Epithelial: <1
WBC UR: 7 /HPF (ref 0–5)

## 2013-05-03 LAB — CBC
HCT: 22.3 % — ABNORMAL LOW (ref 35.0–47.0)
HGB: 7.5 g/dL — ABNORMAL LOW (ref 12.0–16.0)
MCH: 29.7 pg (ref 26.0–34.0)
MCHC: 33.5 g/dL (ref 32.0–36.0)
MCV: 89 fL (ref 80–100)
RBC: 2.52 10*6/uL — ABNORMAL LOW (ref 3.80–5.20)
RDW: 15.5 % — ABNORMAL HIGH (ref 11.5–14.5)
WBC: 4.9 10*3/uL (ref 3.6–11.0)

## 2013-05-03 LAB — COMPREHENSIVE METABOLIC PANEL
Albumin: 3.4 g/dL (ref 3.4–5.0)
Anion Gap: 7 (ref 7–16)
BUN: 10 mg/dL (ref 7–18)
Bilirubin,Total: 0.3 mg/dL (ref 0.2–1.0)
Calcium, Total: 8.6 mg/dL (ref 8.5–10.1)
Chloride: 109 mmol/L — ABNORMAL HIGH (ref 98–107)
EGFR (Non-African Amer.): >60
Osmolality: 285 (ref 275–301)
Potassium: 3.9 mmol/L (ref 3.5–5.1)
SGOT(AST): 28 U/L (ref 15–37)
Sodium: 144 mmol/L (ref 136–145)
Total Protein: 6.5 g/dL (ref 6.4–8.2)

## 2013-05-03 LAB — PROTIME-INR
INR: 1
Prothrombin Time: 13.5 secs (ref 11.5–14.7)

## 2013-05-03 LAB — TROPONIN I: Troponin-I: <0.02 ng/mL

## 2013-05-04 LAB — CBC WITH DIFFERENTIAL/PLATELET
Basophil #: 0.1 10*3/uL (ref 0.0–0.1)
Basophil %: 1.5 %
Eosinophil %: 7.5 %
HCT: 25.1 % — ABNORMAL LOW (ref 35.0–47.0)
HGB: 8.5 g/dL — ABNORMAL LOW (ref 12.0–16.0)
Lymphocyte #: 1.4 10*3/uL (ref 1.0–3.6)
Lymphocyte %: 32.1 %
MCV: 88 fL (ref 80–100)
Monocyte %: 9.5 %
Neutrophil #: 2.1 10*3/uL (ref 1.4–6.5)
RBC: 2.87 10*6/uL — ABNORMAL LOW (ref 3.80–5.20)
RDW: 15.1 % — ABNORMAL HIGH (ref 11.5–14.5)
WBC: 4.2 10*3/uL (ref 3.6–11.0)

## 2013-05-04 LAB — BASIC METABOLIC PANEL
Anion Gap: 6 — ABNORMAL LOW (ref 7–16)
Calcium, Total: 8.2 mg/dL — ABNORMAL LOW (ref 8.5–10.1)
Co2: 27 mmol/L (ref 21–32)
Creatinine: 0.54 mg/dL — ABNORMAL LOW (ref 0.60–1.30)
EGFR (African American): >60
Osmolality: 286 (ref 275–301)
Potassium: 3.8 mmol/L (ref 3.5–5.1)
Sodium: 143 mmol/L (ref 136–145)

## 2013-05-04 LAB — RETICULOCYTES: Absolute Retic Count: 0.141 10*6/uL

## 2013-05-05 LAB — HEMOGLOBIN: HGB: 8.9 g/dL — ABNORMAL LOW (ref 12.0–16.0)

## 2013-05-06 LAB — CBC WITH DIFFERENTIAL/PLATELET
Basophil #: 0.1 10*3/uL (ref 0.0–0.1)
Basophil %: 1.4 %
Eosinophil %: 8.1 %
HGB: 9 g/dL — ABNORMAL LOW (ref 12.0–16.0)
Lymphocyte #: 1.2 10*3/uL (ref 1.0–3.6)
MCV: 89 fL (ref 80–100)
Monocyte #: 0.4 x10 3/mm (ref 0.2–0.9)
Monocyte %: 9.4 %
Neutrophil %: 54.4 %
RBC: 2.99 10*6/uL — ABNORMAL LOW (ref 3.80–5.20)
RDW: 16.1 % — ABNORMAL HIGH (ref 11.5–14.5)
WBC: 4.7 10*3/uL (ref 3.6–11.0)

## 2013-05-07 DIAGNOSIS — E785 Hyperlipidemia, unspecified: Secondary | ICD-10-CM | POA: Insufficient documentation

## 2013-05-07 LAB — CBC WITH DIFFERENTIAL/PLATELET
Eosinophil #: 0.4 10*3/uL (ref 0.0–0.7)
Eosinophil %: 8.5 %
Lymphocyte %: 24.3 %
MCH: 30.4 pg (ref 26.0–34.0)
MCHC: 33.9 g/dL (ref 32.0–36.0)
Monocyte #: 0.5 x10 3/mm (ref 0.2–0.9)
Monocyte %: 9.9 %
Neutrophil %: 56 %
RBC: 3.11 10*6/uL — ABNORMAL LOW (ref 3.80–5.20)

## 2013-05-14 LAB — CBC CANCER CENTER
Basophil #: 0.1 x10 3/mm (ref 0.0–0.1)
Basophil %: 1.3 %
Eosinophil #: 0.4 x10 3/mm (ref 0.0–0.7)
Eosinophil %: 8.2 %
HCT: 33.8 % — ABNORMAL LOW (ref 35.0–47.0)
HGB: 11.3 g/dL — ABNORMAL LOW (ref 12.0–16.0)
Lymphocyte %: 26.7 %
MCH: 29.7 pg (ref 26.0–34.0)
MCHC: 33.4 g/dL (ref 32.0–36.0)
MCV: 89 fL (ref 80–100)
Neutrophil #: 2.5 x10 3/mm (ref 1.4–6.5)
Neutrophil %: 55 %
Platelet: 234 x10 3/mm (ref 150–440)
WBC: 4.6 x10 3/mm (ref 3.6–11.0)

## 2013-05-22 LAB — CANCER CENTER HEMOGLOBIN: HGB: 10.5 g/dL — ABNORMAL LOW (ref 12.0–16.0)

## 2013-05-27 ENCOUNTER — Ambulatory Visit: Payer: Self-pay | Admitting: Internal Medicine

## 2013-06-13 LAB — CANCER CENTER HEMOGLOBIN: HGB: 8.4 g/dL — ABNORMAL LOW (ref 12.0–16.0)

## 2013-06-14 LAB — FERRITIN: Ferritin (ARMC): 20 ng/mL (ref 8–388)

## 2013-06-14 LAB — CANCER CENTER HEMOGLOBIN: HGB: 11.8 g/dL — ABNORMAL LOW (ref 12.0–16.0)

## 2013-06-19 LAB — CANCER CENTER HEMOGLOBIN: HGB: 10.9 g/dL — ABNORMAL LOW (ref 12.0–16.0)

## 2013-06-26 ENCOUNTER — Ambulatory Visit: Payer: Self-pay | Admitting: Internal Medicine

## 2013-07-03 LAB — CANCER CENTER HEMOGLOBIN: HGB: 11.3 g/dL — ABNORMAL LOW (ref 12.0–16.0)

## 2013-07-17 LAB — CANCER CENTER HEMOGLOBIN: HGB: 10.7 g/dL — ABNORMAL LOW (ref 12.0–16.0)

## 2013-07-27 ENCOUNTER — Ambulatory Visit: Payer: Self-pay | Admitting: Internal Medicine

## 2013-07-31 LAB — IRON AND TIBC
Iron Bind.Cap.(Total): 287 ug/dL (ref 250–450)
Iron: 50 ug/dL (ref 50–170)

## 2013-07-31 LAB — CANCER CENTER HEMOGLOBIN: HGB: 9.6 g/dL — ABNORMAL LOW (ref 12.0–16.0)

## 2013-07-31 LAB — FERRITIN: Ferritin (ARMC): 20 ng/mL (ref 8–388)

## 2013-08-14 LAB — CANCER CENTER HEMOGLOBIN: HGB: 10.1 g/dL — ABNORMAL LOW (ref 12.0–16.0)

## 2013-08-27 ENCOUNTER — Ambulatory Visit: Payer: Self-pay | Admitting: Internal Medicine

## 2013-08-28 LAB — CBC CANCER CENTER
Eosinophil #: 0.3 x10 3/mm (ref 0.0–0.7)
HCT: 31.3 % — ABNORMAL LOW (ref 35.0–47.0)
HGB: 10.3 g/dL — ABNORMAL LOW (ref 12.0–16.0)
Lymphocyte %: 28.8 %
MCH: 28.6 pg (ref 26.0–34.0)
MCHC: 33.1 g/dL (ref 32.0–36.0)
Monocyte #: 0.4 x10 3/mm (ref 0.2–0.9)
Neutrophil #: 2.6 x10 3/mm (ref 1.4–6.5)
Neutrophil %: 55.7 %
RBC: 3.62 10*6/uL — ABNORMAL LOW (ref 3.80–5.20)
RDW: 15.6 % — ABNORMAL HIGH (ref 11.5–14.5)

## 2013-09-25 LAB — IRON AND TIBC
Iron Saturation: 12 %
Iron: 38 ug/dL — ABNORMAL LOW (ref 50–170)

## 2013-09-25 LAB — CBC CANCER CENTER
Basophil #: 0 x10 3/mm (ref 0.0–0.1)
Eosinophil %: 3.6 %
HCT: 33.4 % — ABNORMAL LOW (ref 35.0–47.0)
MCH: 27 pg (ref 26.0–34.0)
MCHC: 32.2 g/dL (ref 32.0–36.0)
MCV: 84 fL (ref 80–100)
Monocyte %: 7.5 %
Neutrophil #: 3.2 x10 3/mm (ref 1.4–6.5)
Platelet: 296 x10 3/mm (ref 150–440)
RBC: 3.99 10*6/uL (ref 3.80–5.20)
WBC: 4.6 x10 3/mm (ref 3.6–11.0)

## 2013-09-25 LAB — FERRITIN: Ferritin (ARMC): 16 ng/mL (ref 8–388)

## 2013-09-26 ENCOUNTER — Ambulatory Visit: Payer: Self-pay | Admitting: Internal Medicine

## 2013-11-20 ENCOUNTER — Ambulatory Visit: Payer: Self-pay | Admitting: Internal Medicine

## 2013-11-20 LAB — IRON AND TIBC
Iron Bind.Cap.(Total): 236 ug/dL — ABNORMAL LOW (ref 250–450)
Iron: 41 ug/dL — ABNORMAL LOW (ref 50–170)
Unbound Iron-Bind.Cap.: 195 ug/dL

## 2013-11-26 ENCOUNTER — Ambulatory Visit: Payer: Self-pay | Admitting: Internal Medicine

## 2014-01-02 ENCOUNTER — Ambulatory Visit: Payer: Self-pay | Admitting: Family Medicine

## 2014-01-21 ENCOUNTER — Ambulatory Visit: Payer: Self-pay | Admitting: Internal Medicine

## 2014-03-12 ENCOUNTER — Ambulatory Visit: Payer: Self-pay | Admitting: Internal Medicine

## 2014-03-12 LAB — CREATININE, SERUM
CREATININE: 0.84 mg/dL (ref 0.60–1.30)
EGFR (African American): >60
EGFR (Non-African Amer.): >60

## 2014-03-12 LAB — HEPATIC FUNCTION PANEL A (ARMC)
ALK PHOS: 114 U/L
Albumin: 3.5 g/dL (ref 3.4–5.0)
BILIRUBIN TOTAL: 0.3 mg/dL (ref 0.2–1.0)
Bilirubin, Direct: <0.05 mg/dL (ref 0.00–0.20)
SGOT(AST): 13 U/L — ABNORMAL LOW (ref 15–37)
SGPT (ALT): 20 U/L (ref 12–78)
TOTAL PROTEIN: 7.3 g/dL (ref 6.4–8.2)

## 2014-03-12 LAB — CBC CANCER CENTER
BASOS PCT: 1.4 %
Basophil #: 0.1 x10 3/mm (ref 0.0–0.1)
EOS PCT: 5.6 %
Eosinophil #: 0.2 x10 3/mm (ref 0.0–0.7)
HCT: 32.8 % — ABNORMAL LOW (ref 35.0–47.0)
HGB: 10.2 g/dL — ABNORMAL LOW (ref 12.0–16.0)
Lymphocyte #: 1.1 x10 3/mm (ref 1.0–3.6)
Lymphocyte %: 27.5 %
MCH: 25.6 pg — ABNORMAL LOW (ref 26.0–34.0)
MCHC: 31.2 g/dL — ABNORMAL LOW (ref 32.0–36.0)
MCV: 82 fL (ref 80–100)
MONO ABS: 0.4 x10 3/mm (ref 0.2–0.9)
Monocyte %: 9.1 %
Neutrophil #: 2.2 x10 3/mm (ref 1.4–6.5)
Neutrophil %: 56.4 %
Platelet: 284 x10 3/mm (ref 150–440)
RBC: 3.99 10*6/uL (ref 3.80–5.20)
RDW: 14.1 % (ref 11.5–14.5)
WBC: 3.9 x10 3/mm (ref 3.6–11.0)

## 2014-03-12 LAB — IRON AND TIBC
IRON SATURATION: 7 %
Iron Bind.Cap.(Total): 324 ug/dL (ref 250–450)
Iron: 23 ug/dL — ABNORMAL LOW (ref 50–170)
Unbound Iron-Bind.Cap.: 301 ug/dL

## 2014-03-12 LAB — FERRITIN: FERRITIN (ARMC): 12 ng/mL (ref 8–388)

## 2014-03-27 ENCOUNTER — Ambulatory Visit: Payer: Self-pay | Admitting: Internal Medicine

## 2014-05-07 ENCOUNTER — Ambulatory Visit: Payer: Self-pay | Admitting: Internal Medicine

## 2014-05-08 LAB — IRON AND TIBC
IRON SATURATION: 24 %
Iron Bind.Cap.(Total): 249 ug/dL — ABNORMAL LOW (ref 250–450)
Iron: 59 ug/dL (ref 50–170)
UNBOUND IRON-BIND. CAP.: 190 ug/dL

## 2014-05-08 LAB — FERRITIN: FERRITIN (ARMC): 112 ng/mL (ref 8–388)

## 2014-05-08 LAB — CANCER CENTER HEMOGLOBIN: HGB: 11.8 g/dL — ABNORMAL LOW (ref 12.0–16.0)

## 2014-05-27 ENCOUNTER — Ambulatory Visit: Payer: Self-pay | Admitting: Internal Medicine

## 2014-07-02 ENCOUNTER — Ambulatory Visit: Payer: Self-pay | Admitting: Internal Medicine

## 2014-07-02 LAB — CANCER CENTER HEMOGLOBIN: HGB: 11.9 g/dL — AB (ref 12.0–16.0)

## 2014-07-02 LAB — FERRITIN: Ferritin (ARMC): 60 ng/mL (ref 8–388)

## 2014-07-02 LAB — IRON AND TIBC
IRON SATURATION: 34 %
Iron Bind.Cap.(Total): 237 ug/dL — ABNORMAL LOW (ref 250–450)
Iron: 80 ug/dL (ref 50–170)
UNBOUND IRON-BIND. CAP.: 157 ug/dL

## 2014-07-17 DIAGNOSIS — K279 Peptic ulcer, site unspecified, unspecified as acute or chronic, without hemorrhage or perforation: Secondary | ICD-10-CM | POA: Insufficient documentation

## 2014-07-27 ENCOUNTER — Ambulatory Visit: Payer: Self-pay | Admitting: Internal Medicine

## 2014-08-27 ENCOUNTER — Ambulatory Visit: Payer: Self-pay | Admitting: Internal Medicine

## 2014-08-27 LAB — CBC CANCER CENTER
Basophil #: 0.1 x10 3/mm (ref 0.0–0.1)
Basophil %: 1.1 %
Eosinophil #: 0.3 x10 3/mm (ref 0.0–0.7)
Eosinophil %: 4.7 %
HCT: 35 % (ref 35.0–47.0)
HGB: 11.4 g/dL — ABNORMAL LOW (ref 12.0–16.0)
LYMPHS ABS: 1.5 x10 3/mm (ref 1.0–3.6)
LYMPHS PCT: 23.6 %
MCH: 28.3 pg (ref 26.0–34.0)
MCHC: 32.6 g/dL (ref 32.0–36.0)
MCV: 87 fL (ref 80–100)
Monocyte #: 0.5 x10 3/mm (ref 0.2–0.9)
Monocyte %: 8.4 %
Neutrophil #: 3.8 x10 3/mm (ref 1.4–6.5)
Neutrophil %: 62.2 %
Platelet: 293 x10 3/mm (ref 150–440)
RBC: 4.02 10*6/uL (ref 3.80–5.20)
RDW: 13.5 % (ref 11.5–14.5)
WBC: 6.2 x10 3/mm (ref 3.6–11.0)

## 2014-08-27 LAB — IRON AND TIBC
Iron Bind.Cap.(Total): 293 ug/dL (ref 250–450)
Iron Saturation: 25 %
Iron: 73 ug/dL (ref 50–170)
Unbound Iron-Bind.Cap.: 220 ug/dL

## 2014-08-27 LAB — HEPATIC FUNCTION PANEL A (ARMC)
AST: 27 U/L (ref 15–37)
Albumin: 3.5 g/dL (ref 3.4–5.0)
Alkaline Phosphatase: 101 U/L
BILIRUBIN TOTAL: 0.4 mg/dL (ref 0.2–1.0)
SGPT (ALT): 27 U/L
TOTAL PROTEIN: 7.6 g/dL (ref 6.4–8.2)

## 2014-08-27 LAB — CREATININE, SERUM
Creatinine: 0.79 mg/dL (ref 0.60–1.30)
EGFR (Non-African Amer.): >60

## 2014-08-27 LAB — FERRITIN: FERRITIN (ARMC): 25 ng/mL (ref 8–388)

## 2014-09-26 ENCOUNTER — Ambulatory Visit: Payer: Self-pay | Admitting: Internal Medicine

## 2014-11-19 ENCOUNTER — Ambulatory Visit: Payer: Self-pay | Admitting: Internal Medicine

## 2014-11-29 DIAGNOSIS — I350 Nonrheumatic aortic (valve) stenosis: Secondary | ICD-10-CM | POA: Insufficient documentation

## 2014-12-17 ENCOUNTER — Ambulatory Visit: Payer: Self-pay | Admitting: Internal Medicine

## 2014-12-17 LAB — CBC CANCER CENTER
BASOS ABS: 0.1 x10 3/mm (ref 0.0–0.1)
Basophil %: 0.9 %
Eosinophil #: 0.3 x10 3/mm (ref 0.0–0.7)
Eosinophil %: 5 %
HCT: 35.3 % (ref 35.0–47.0)
HGB: 11.3 g/dL — AB (ref 12.0–16.0)
LYMPHS ABS: 1.6 x10 3/mm (ref 1.0–3.6)
LYMPHS PCT: 27.1 %
MCH: 26.9 pg (ref 26.0–34.0)
MCHC: 32.1 g/dL (ref 32.0–36.0)
MCV: 84 fL (ref 80–100)
MONOS PCT: 7.7 %
Monocyte #: 0.5 x10 3/mm (ref 0.2–0.9)
Neutrophil #: 3.5 x10 3/mm (ref 1.4–6.5)
Neutrophil %: 59.3 %
Platelet: 279 x10 3/mm (ref 150–440)
RBC: 4.22 10*6/uL (ref 3.80–5.20)
RDW: 14.1 % (ref 11.5–14.5)
WBC: 5.9 x10 3/mm (ref 3.6–11.0)

## 2014-12-17 LAB — IRON AND TIBC
IRON: 42 ug/dL — AB (ref 50–170)
Iron Bind.Cap.(Total): 268 ug/dL (ref 250–450)
Iron Saturation: 16 %
Unbound Iron-Bind.Cap.: 226 ug/dL

## 2014-12-17 LAB — FERRITIN: Ferritin (ARMC): 26 ng/mL (ref 8–388)

## 2014-12-27 ENCOUNTER — Ambulatory Visit: Payer: Self-pay | Admitting: Internal Medicine

## 2015-01-09 ENCOUNTER — Ambulatory Visit: Payer: Self-pay | Admitting: Family Medicine

## 2015-02-17 ENCOUNTER — Ambulatory Visit: Payer: Self-pay | Admitting: Internal Medicine

## 2015-02-25 ENCOUNTER — Ambulatory Visit
Admit: 2015-02-25 | Disposition: A | Discharge status: Home / Self Care | Payer: Self-pay | Attending: Internal Medicine | Admitting: Internal Medicine

## 2015-04-15 NOTE — Consult Note (Signed)
CC: GI bleed.  Pt with fall in hgb, heme positive stool, hx of ulcer disease 10 years ago, cardiac disease with stent placed 6 months ago, bare metal.  Dr. Ubaldo Glassing is her doctor.  Pt with postural sxs, light headedness.  Angina like pain felt between shoulder blades at times. Troponin slightly elevated.  Plan to consult Dr. Ubaldo Glassing, talk to Hospitalist and transfuse 1 or 2 units, do EGD either today or tomorrow. Repeat EKG due to non specific changes.  Electronic Signatures: Manya Silvas (MD)  (Signed on 06-Dec-13 13:07)  Authored  Last Updated: 06-Dec-13 13:07 by Manya Silvas (MD)

## 2015-04-15 NOTE — H&P (Signed)
PATIENT NAME:  Joyce Potter, Joyce Potter MR#:  [id number] DATE OF BIRTH:  1946-09-12  DATE OF ADMISSION:  12/01/2012  PRIMARY CARE PHYSICIAN: Dr. Kary Kos   REFERRING PHYSICIAN: Dr. Thomasene Lot   GASTROENTEROLOGIST: Dr. Gustavo Lah   CARDIOLOGIST: Dr. Ubaldo Glassing   CHIEF COMPLAINT: Dizziness and low hemoglobin.   HISTORY OF PRESENT ILLNESS: The patient is a [name] with a past medical history of coronary artery disease status post PCI in June 2013 on Plavix, insulin dependent diabetes mellitus on Lantus, hypertension, hyperlipidemia, and benign thyroid nodule who was sent over to the ER by her primary care physician for drop in her hemoglobin. The patient is reporting that lately her blood pressure was uncontrolled and has been following up with her primary care physician on a regular basis. Two weeks ago her blood work was done and her hemoglobin was at 12. She went to see her doctor yesterday for uncontrolled blood pressure as well as low potassium. Repeat blood work was done by her primary care physician yesterday and her hemoglobin was at 8.0. They repeated it again today and it was at 8.0. Primary care physician has asked the patient to go to the ER for drop in hemoglobin for possible EGD. The patient denies any abdominal pain but is complaining of nausea. Denies any vomiting. Denies any diarrhea or blood in her stool. In the ER stool for Hemoccult was done by the ER physician which was positive. Hemoglobin in the ER is at 8.3 with hematocrit 25.9. The patient is complaining of aching sensation between the shoulders. She feels dizzy when she stands up. Denies any passing out. Complaining of nausea but no vomiting. Denies any abdominal pain. The patient is reporting that her stool is dark because she takes iron pills. She had a colonoscopy done in August 2011 and, according to the report, biopsies were done for papilliform mucosa in the appendiceal orifice. At that time she was diagnosed with  diverticulosis of sigmoid and descending colon. The patient is reporting that those biopsies were benign and colonoscopy procedure was normal as reported by her physician. Recently in June 2013 the patient had PCI and one stent placement done following which she has been taking Plavix. Denies any other complaints. Family members at bedside.   PAST MEDICAL HISTORY:  1. Coronary artery disease, status post PCI and one stent placement in June 2013, on Plavix.  2. Insulin dependent diabetes mellitus. The patient takes Lantus.  3. Hypertension.  4. Hyperlipidemia.  5. Benign thyroid nodule.   PAST SURGICAL HISTORY:  1. Cataract repair.   2. PCI with one stent placement.   ALLERGIES: The patient is allergic to penicillin and sulfa.   HOME MEDICATIONS:  1. Pravastatin mg once a day.  2. Plavix 75 mg once a day.  3. Metoprolol succinate 50 mg once a day.  4. Losartan 100 mg 1 tablet once a day.  5. Lantus 65 units subcutaneously q.a.m. and 22 units at bedtime.   6. Hydrochlorothiazide 25 mg once a day.  7. Garlic capsule 1025 mg once a day.  8. Ferrous sulfate 325 mg once a day. 9. Apidra 15 units sub-Q once q.a.m.   PSYCHOSOCIAL HISTORY: Lives at home with her friend. Denies smoking, alcohol, or illicit drug usage.   FAMILY HISTORY: Both parents have diabetes mellitus. Mother had history of breast cancer.   REVIEW OF SYSTEMS: CONSTITUTIONAL: Denies any fever, fatigue, weakness. Denies any pain, weight loss or weight gain. EYES: Denies any blurry vision, redness, inflammation, or  glaucoma. Is status post cataract surgery. ENT: Denies tinnitus, ear pain, discharge, snoring, postnasal drip, sinus pain, dentures, difficulty in swallowing. RESPIRATORY: Denies cough, wheezing, hemoptysis, painful respiration, or pneumonia. CARDIOVASCULAR: Denies any chest pain, orthopnea, edema, palpitations, syncope, or varicose veins. GI: Complaining of nausea but denies any vomiting or diarrhea. Denies any  abdominal pain. Denies hematemesis. Denies melena. Stool for Hemoccult was positive. Denies any gastroesophageal reflux disease, jaundice, hemorrhoids, hernia, change in bowel habits. GENITOURINARY: Denies dysuria, hematuria, renal calculus, frequency, incontinence. GYN: Denies any breast masses, tenderness, or discharge. ENDOCRINE: Denies polyuria, nocturia, polyphagia. Has benign thyroid nodules and the patient is scheduled for surgery in the future. Denies any increased sweating, heat or cold intolerance. HEMATOLOGIC/LYMPHATIC: The patient is anemic. Denies any easy bruising. Denies bleeding or swollen glands. INTEGUMENTARY: Denies acne, rash, lesions. MUSCULOSKELETAL: Complaining of pain in between the shoulder blades but denies any knee pain, hip pain, neck pain, back pain. Denies arthritis, swelling, gout, redness. NEUROLOGIC: Denies numbness, weakness, dysarthria, epilepsy, headaches, tremors, vertigo, migraines, seizures. PSYCH: Denies anxiety, insomnia, bipolar disorder, or depression.   PHYSICAL EXAMINATION:   VITAL SIGNS: Temperature 97.5, pulse 85, respiratory rate 18, blood pressure 159/57, pulse oximetry 99% on room air.   GENERAL APPEARANCE: Not in acute distress. Answering all questions appropriately. Well built and well nourished.   HEENT: Normocephalic, atraumatic. Pupils are equally reacting to light and accommodation. Nares are patent. No discharge. No postnasal drip. Moist mucous membranes.   NECK: Supple. No JVD. No carotid bruits. No swollen glands.   LUNGS: Clear to auscultation bilaterally. No crackles, wheezing, rales, rhonchi. No accessory muscle use. No chest wall tenderness  CARDIOVASCULAR: S1, S2 normal. Regular rate and rhythm. Point of maximal impulse is intact. No peripheral edema. Pedal pulses 2 +  GI: Soft. Bowel sounds are positive in all four quadrants. Nontender, nondistended. Central adiposity. No masses felt  NEUROLOGIC: Awake, alert, and oriented x3,  answering all questions appropriately. Motor and sensory are grossly intact. Reflexes are 2+.   SKIN: No rashes. No lesions. No jaundice. warm to touch. Turgor normal  EXTREMITIES: No cyanosis, clubbing, or edema.   MUSCULOSKELETAL: Range of motion is intact. No swelling of the joints are noticed. No erythema.  LABS AND IMAGING STUDIES: Glucose 195, BUN 28, creatinine 1.09, sodium 137, potassium 3.9, chloride 104, CO2 26, GFR 53, anion gap 7, serum osmolality 285, calcium 8.7, lipase 214, total protein 7.4, albumin 3.8, bilirubin total 0.3, ALT 21, AST 23, WBC 8.2, hemoglobin 8.3, hematocrit 25.9, platelet count 331,000, MCV 85.   12-lead EKG normal sinus rhythm, normal PR and QRS intervals, nonspecific ST-T wave changes.  ASSESSMENT AND PLAN: This is a [name] sent over to the ER by primary care physician for drop in hemoglobin from 12 to 8.0 and the patient is complaining of dizziness whenever she stands up and will be admitted with the following assessment and plan.  1. Anemia, possible upper GI bleed with Hemoccult positive stool.No acute bleeding. Will admit her to tele bed. Will keep her n.p.o. Will provide her IV fluids half normal saline with 20 KCl at 125 mL/h while patient is n.p.o. GI consult is placed to Dr. Gustavo Lah. Will provide her Protonix 40 mg IV q.12 hours. Will check CBC in a.m. Will type and screen and, if necessary, will do blood transfusion. Check a.m. labs CBC and BMP.  2. Coronary artery disease status post PCI and stent placement in June 2013. At this time patient is n.p.o. for possible EGD  in a.m. We are holding her home medications including Plavix.  3. Hypertension. Blood pressure uncontrolled. As the patient is n.p.o. will provide her Lopressor 5 mg IV q.6 hours as needed for systolic blood pressure greater than 150.  4. History of insulin dependent diabetes mellitus. The patient took her morning dose of Lantus. Will hold off on Lantus and put her on  insulin sliding scale as patient is n.p.o.  5. Hyperlipidemia. Will hold off on statin.  6. Will provide her GI prophylaxis with Protonix 40 mg IV q.12 hours.  7. DVT prophylaxis with SCDs.   CODE STATUS: The patient is FULL CODE.   The diagnosis and plan of care was discussed in detail with the patient and her family members at bedside. They all verbalized understanding of the plan.   TOTAL TIME SPENT ON ADMISSION: 60 minutes.  ____________________________ Nicholes Mango, MD ag:drc D: 12/01/2012 00:55:33 ET T: 12/01/2012 06:48:08 ET JOB#: [id number] cc: Nicholes Mango, MD, <Dictator>, Irven Easterly. Kary Kos, MD, Javier Docker Ubaldo Glassing, MD, Lollie Sails, MD Nicholes Mango MD ELECTRONICALLY SIGNED 12/02/2012 2:08

## 2015-04-15 NOTE — Consult Note (Signed)
CC: GI bleeding.  Pt hgb dropped to 7.8, she got a unit and her color is better, feeling better, stronger, not lite headed.  will give one more unit as her bleeding may restart over night.  Plan EGD tomorrow after 10am.   chest clear.  Discussed plans with patient and family. They understand and are in agreement.  Electronic Signatures: Manya Silvas (MD)  (Signed on 06-Dec-13 19:10)  Authored  Last Updated: 06-Dec-13 19:10 by Manya Silvas (MD)

## 2015-04-15 NOTE — Consult Note (Signed)
Pt hemoglobin is stable at 10, her stool is now brown. Plan for colonoscopy tomorrow afternoon and early morning prep. if neg can go home and do capsule as out pt and start ASA 81mg  for her bare metal stent, discussed with Dr. Ubaldo Glassing.   Electronic Signatures: Manya Silvas (MD)  (Signed on 08-Dec-13 10:54)  Authored  Last Updated: 08-Dec-13 10:54 by Manya Silvas (MD)

## 2015-04-15 NOTE — Op Note (Signed)
PATIENT NAME:  Joyce Potter, Joyce Potter MR#:  [id number] DATE OF BIRTH:  1946-04-26  DATE OF PROCEDURE:  12/02/2012  PROCEDURE: Upper endoscopy.  PREOPERATIVE INDICATION: GI bleeding.  POSTOPERATIVE DIAGNOSIS: Two minimal spots of mild gastritis, questionable small hiatal hernia.   ANESTHESIA: Medications done with anesthesia use of propofol and/or Versed and/or fentanyl. See their notes for confirmation.  DESCRIPTION OF PROCEDURE: After appropriate timeout was done identifying the patient and she was put to sleep by the anesthesiologist the 160 Olympus endoscope was advanced under direct vision, passed down the throat into the esophagus and this esophagus was normal. No abnormality seen. Scope was passed into the stomach. Small fundic pool suctioned. Air was put in the stomach, passed through the pyloric area into the duodenal bulb and the second portion and these areas were normal. Scope was withdrawn to the stomach. Examination of antrum, body, cardia and fundus showed slight irritation in one spot in the antrum and another spot in the body of the stomach. No other abnormalities were seen. Photographs were taken and the procedure note was done after the exam but they could not be brought up by the technician which is why this note is being dictated. Air was removed. Scope was withdrawn. Patient tolerated procedure well.   PLAN: Schedule for colonoscopy for Monday because of GI bleeding without finding source on upper endoscopy.  ____________________________ Manya Silvas, MD rte:cms D: 12/02/2012 12:44:54 ET T: 12/02/2012 12:59:54 ET JOB#: [id number]  cc: Manya Silvas, MD, <Dictator>  Manya Silvas MD ELECTRONICALLY SIGNED 12/09/2012 11:50

## 2015-04-15 NOTE — Discharge Summary (Signed)
PATIENT NAME:  Joyce Potter, Joyce Potter MR#:  [id number] DATE OF BIRTH:  [birth year]  DATE OF ADMISSION:  12/01/2012 DATE OF DISCHARGE:  12/04/2012  For a detailed note, please see the History and Physical done on admission by Dr. Margaretmary Eddy.   DIAGNOSES AT DISCHARGE:  1. GI bleed, source unclear. 2. Acute blood loss anemia secondary to GI bleed.  3. History of coronary artery disease, status post stent placement.  4. Hypertension.  5. Diabetes.  6. Hyperlipidemia.   DIET: The patient is being discharged on an American Di[employer] Association low sodium, low fat diet.   ACTIVITY: As tolerated.   FOLLOWUP: Follow up with Dr. Gaylyn Cheers and Dr. Maryland Pink in the next 1 to 2 weeks.   DISCHARGE MEDICATIONS:  1. Omeprazole 40 mg daily.  2. Pravachol 40 mg at bedtime.  3. Losartan 100 mg daily.  4. Hydrochlorothiazide 25 mg daily.  5. Metoprolol succinate 50 mg daily.  6. Iron sulfate 325 mg daily.  7. Garlic 2992 mg daily. 8. Apidra 15 units daily.  9. Lantus 65 units in the morning, 22 units in the evening. 10. Aspirin 81 mg daily. The patient is being asked to discontinue her Plavix for now.   CONSULTANTS:  1. Dr. Jordan Hawks, cardiology. 2. Dr. Gaylyn Cheers, gastroenterology.   PERTINENT STUDIES:  1. Endoscopy done on 12/07 showing no evidence of any acute bleeding. Mild gastritis. 2. Colonoscopy done on 12/04/2012 showing internal hemorrhoids. Otherwise, no other acute abnormalities noted.   HOSPITAL COURSE: This is a 69 year old female with medical problems as mentioned above who presented to the hospital on 12/01/2012 secondary to a drop in her hemoglobin over two weeks, noted to have heme-positive stools.  1. GI bleed: The source of the GI bleed is still unclear. The patient was admitted with a drop in hemoglobin from 12 to 8 over the course of two weeks. She was noted to have heme-positive stools and therefore was admitted to the hospital. She was seen by Dr. Vira Agar from  gastroenterology and underwent an endoscopy and colonoscopy, both of which did not show any evidence of acute bleeding. She was transfused 2 units of packed red blood cells and her hemoglobin is up to 10 prior to discharge. She has had no evidence of any further acute bleeding. She is being scheduled for capsule endoscopy as an outpatient by Dr. Vira Agar.  2. Acute blood loss anemia: The patient presented with a hemoglobin of as low as 8, which had drifted down over two weeks from 12. This is likely acute blood loss anemia from GI bleed. The patient was transfused 2 units of packed red blood cells while being in the hospital. Hemoglobin was up to 10. She had no evidence of any acute further bleeding. Her CBC likely needs to be further followed up as an outpatient by her primary care physician.  3. Elevated troponin with some shoulder pain: The patient prior to coming to the hospital was also complaining of some shoulder blade pain and was also noted to have a slightly elevated troponin at 0.06. It went up as high as 0.1. The patient was seen in consultation by Dr. Jordan Hawks, her cardiologist, who did not think that the troponin elevation was related to acute coronary syndrome but likely demand ischemia from anemia. The patient's EKG showed no acute changes. She had no further episodes of chest pain or shortness of breath worrisome for angina. She was on Plavix prior to coming to the hospital, although that was  discontinued as she had a bare- metal stent, which was over six plus months ago.   4. History of coronary artery disease: Status post stent placement. The patient did not have any evidence of acute coronary syndrome while in the hospital. She had mild troponin elevation secondary to demand ischemia. Again as mentioned, she was on Plavix prior to coming in, which was discontinued given the GI bleed. She was told to hold her Plavix for now and take baby aspirin along with her beta blocker and statin as  mentioned.  5. Diabetes: The patient had no evidence of hypo- or hyperglycemia. She was maintained on Lantus and Apidra and she will continue that upon discharge.  6. Hyperlipidemia: The patient was maintained on Pravachol and she will continue that.  7. Gastroesophageal reflux disease: The patient was maintained on omeprazole and she will continue that.  8. CODE STATUS: The patient is a FULL CODE.   TIME SPENT WITH DISCHARGE: 40 minutes.    ____________________________ Belia Heman. Verdell Carmine, MD vjs:bjt D: 12/05/2012 08:01:06 ET T: 12/05/2012 14:01:18 ET JOB#: [id number]  cc: Belia Heman. Verdell Carmine, MD, <Dictator> Manya Silvas, MD Irven Easterly. Kary Kos, MD Henreitta Leber MD ELECTRONICALLY SIGNED 12/21/2012 14:40

## 2015-04-15 NOTE — Consult Note (Signed)
PATIENT NAME:  Joyce Potter, Joyce Potter MR#:  [id number] DATE OF BIRTH:  05-May-1946  DATE OF CONSULTATION:  12/01/2012  REFERRING PHYSICIAN:  Nicholes Mango, MD  CONSULTING PHYSICIAN:  Gaylyn Cheers, MD/Jihad Brownlow A. Jerelene Redden, ANP  PRIMARY CARE PROVIDER: Maryland Pink, MD   REASON FOR CONSULTATION: Upper GI bleed, consider upper endoscopy.   HISTORY OF PRESENT ILLNESS: This is a [name] patient of Dr. Kary Kos, with past medical history of coronary disease with bare metal stents placed June 2013 on Plavix, insulin dependent diabetes mellitus on Lantus, hypertension, hyperlipidemia, benign thyroid nodule, and remote history of upper GI bleed in 2007 who presented to her primary care provider for problems with blood pressure variation and weakness. Two weeks ago she had a hemoglobin of 12 and yesterday she had a hemoglobin of 8.0. The patient states since last month she has had weakness, fatigue, intermittent lightheadedness and dizziness. She thought she had vertigo especially when riding  in a car as she can recall a church trip last month where she had car sickness and vomited greenish emesis. She has had decreased appetite and a vague sense of nausea after eating but no abdominal pain. The patient has history of upper GI ulcer about 2007 with upper endoscopy performed in [place], [place] and she has been on a proton pump inhibitor ever since. She has been on omeprazole 40 mg once daily and she takes this medication consistently. The patient has had problems with headaches and had a recent MRI of the brain with negative findings reported by her. She had used Tylenol only and knows to avoid NSAIDs. The patient has been placed on Plavix after her bare metal stent in June and does not utilize any type of aspirin products.   She reports blood pressure has been variable and she has been hesitant to use Anusol type products. She has had hard stools, some rectal discomfort, and occasionally sees a  teaspoon of blood on her toilet tissue with wiping. She has had a colonoscopy performed 08/24/2010 for screening purposes showing diverticulosis and papilliform mucosa in the appendix orifice area that was biopsied and returned colonic mucosa with prominent lymphoid aggregate. No history of local upper endoscopy.    PAST MEDICAL HISTORY:  1. Coronary artery disease, status post bare metal stent placed June 2013, on Plavix therapy. She denies history of myocardial infarction.  2. History of diabetes mellitus since 1990. She has been on insulin most recently and reports most recent hemoglobin A1c 5.5.  3. Hypertension.  4. Hyperlipidemia.  5. Benign thyroid nodule.  6. History of upper GI bleed in 2007 at an outside facility and she says she has been on iron ever since and had an upper endoscopy in [place], [place] which showed an ulcer in her upper stomach area.   PAST SURGICAL HISTORY:  1. Tubal ligation in 1976.  2. Cataract repair.  3. Two thirds of a hysterectomy. As explained by patient, she had one ovary removed with uterus removed for dysfunctional uterine bleeding.  4. PCI with stent placement June 2013.   MEDICATIONS ON ARRIVAL:  1. Pravastatin daily. 2. Plavix 75 mg daily. 3. Metoprolol 50 mg daily. 4. Losartan 100 mg daily.  5. Lantus 65 units subcutaneous in the morning and 22 units at bedtime.  6. Hydrochlorothiazide 25 mg once daily.  7. Garlic capsule 0626 mg daily. 8. Ferrous sulfate 325 mg daily. 9. Apidra 15 units sub-Q once a.m.   ALLERGIES: Penicillin and sulfa.   SOCIAL HISTORY:  The patient is married since February 2013. She has five grown children from previous marriage. She lives at home with her husband. Denies tobacco, alcohol, or illicit drug use. The patient is not a Jehovah's Witness.   FAMILY HISTORY: Father deceased age 49 with diabetes and emphysema complications. Mother had uterine cancer at around age 63 and had breast cancer at age 28, eventually  deceased at age 49. Sister with Hodgkin's lymphoma, alive under treatment. Negative for colon cancer, colon polyps, GI malignancy, or GI bleed.   REVIEW OF SYSTEMS: Denies any fever or chills. She has had fatigue, weakness over the last month. She denies any vision changes, eye changes. Denies any hearing changes, difficulty swallowing or throat pain. She denies cough, hemoptysis, wheezing, but does have dyspnea on exertion. CARDIOVASCULAR: The patient had chest pain across her chest, heaviness on Monday, four days ago, while walking in [place]. She became sweaty with this, had shortness of breath, had to sit and rest. This was a milder version of her angina that she experienced prior to her stent placement in June. In addition, two months ago she also had a lesser version of this chest pressure. States it goes across her chest into her back. She has not yet talked to Dr. Ubaldo Glassing about this. She denies any palpitations but she does feel her heart beat in her neck at night. Currently she denies any chest pain, shortness of breath. She has had lightheadedness with standing. GI history as noted. Denies stools are darker than normal but they are always dark because of iron. She was Hemoccult positive in the Emergency Room. The patient reports intermittent constipation and then she will pass a hard stool that causes bright red blood on the tissue. She has been afraid to use Anusol suppositories because of it can elevate her blood pressure. She denies any dysuria or hematuria. Denies any endocrine changes. Reports blood sugars have been stable. She has had a headache, negative MRI as noted in history of present illness. Negative MRI a month ago. No NSAID use. Denies any headache currently. Remaining 10 systems negative.   LABORATORY, DIAGNOSTIC, AND RADIOLOGICAL DATA: Admission blood work notable for BUN 28, creatinine 1.09. Troponin 0.06 to 0.10 to 0.07. Hemoglobin 8.3, down to 8.0 today. Platelet count normal. Liver  labs normal. Most recent troponin today 0.07. No radiological records to review.   PHYSICAL EXAMINATION:   VITAL SIGNS: Temperature 97.8, pulse 72, respiratory rate 20, blood pressure 118/69, pulse oximetry on room air 97%.   GENERAL: Well appearing obese Caucasian female resting in bed visiting with her husband in no acute distress.   HEENT: Head is normocephalic. Conjunctivae pale. Sclerae anicteric. Oral mucosa is dry and intact.   NECK: Supple with trachea midline.   HEART: Heart tones S1, S2 with positive systolic murmur, regular rhythm.   LUNGS: Clear to auscultation. Respirations are eupneic.   ABDOMEN: Soft, nontender. Bowel sounds present.   RECTAL: Digital rectal exam deferred as it was obtained in the Emergency Room yesterday and reported heme positive.   LOWER EXTREMITIES: Without edema, cyanosis, or clubbing.   SKIN: Warm and dry. Color is pale. Skin has a brownish papule on her left leg. She says it has not been evaluated by Dermatology.   MUSCULOSKELETAL: Range of motion is intact. No joint swelling or erythema. Gait not evaluated.   PSYCH: Affect and mood within normal. Memory is intact.   NEUROLOGIC: Grossly alert and oriented x3, answering all questions appropriately. Cranial nerves II through XII  grossly intact.   IMPRESSION: The patient presents with weakness, anginal equivalent, and was found to have profound drop in hemoglobin in a two week period from 12 to 8.3. She has had dark stools chronically, was heme positive. Possibility of upper GI bleed is very strong.   PLAN:  1. Recommend blood transfusion for cardiac protection before performing luminal evaluation with upper endoscopy.  2. High dose proton pump inhibitor and she is on Protonix 40 IV q.12 hours.  3. She may have sips of water until she is n.p.o. four hours before the procedure. Not sure if we will do the upper endoscopy today or tomorrow.  4. This case was discussed with Dr. Vira Agar who spoke  with Dr. Verdell Carmine. I spoke with Dr. Ubaldo Glassing regarding her angina equivalent, slight elevation in troponin, and Plavix use. The patient's last Plavix dose was yesterday. She has never taken aspirin per her report. 5.   She is careful to avoid NSAIDs. She does have a history of remote upper GI bleed per her history with EGD in [place], [place]. We do not have records. Colonoscopy was most recently performed in 2011 and showed diverticulosis. The patient does report occasional rectal pain with hard stool, some constipation treated with stool softeners, and occasional fresh blood on the toilet tissue. If the upper endoscopy is unrevealing for explanation of heme positive stool and acute anemia, would need to consider further luminal evaluation with repeat colonoscopy or capsule endoscopy.   This case was discussed with Dr. Vira Agar in collaborative care. Follow-up GI recommendations pending results.   These services provided by Joelene Millin A. Jerelene Redden, ANP under collaborative agreement with Gaylyn Cheers, MD.   ____________________________ Janalyn Harder. Jerelene Redden, ANP kam:drc D: 12/01/2012 12:59:04 ET T: 12/01/2012 13:38:52 ET JOB#: [id number]  cc: Joelene Millin A. Jerelene Redden, ANP, <Dictator> Irven Easterly. Kary Kos, MD Javier Docker Ubaldo Glassing, MD Janalyn Harder Sherlyn Hay, MSN, ANP-BC Adult Nurse Practitioner ELECTRONICALLY SIGNED 12/01/2012 16:03

## 2015-04-15 NOTE — Consult Note (Signed)
colonoscopy was normal except for small hemorrhoids.  Excellent prep.  Pt may go home on soft diet and asprin 81mg  for her stent in heart. We will set up a capsule endoscopy as out patient.  dr. Les Pou notified.  Electronic Signatures: Manya Silvas (MD)  (Signed on 09-Dec-13 18:04)  Authored  Last Updated: 09-Dec-13 18:04 by Manya Silvas (MD)

## 2015-04-15 NOTE — Consult Note (Signed)
General Aspect Pt is a 69 yo female with history of cad/ s/p pci in 6.13 with a BMS in the  now admitted with dizziness and fatigue and dark tarry stools. Noted to be anemic with evidence of gi bleed. Had some chest discomfort but no signficant ekg changes. HGB was 8.0 at last draw. Resting comfortably with no chest pain at present. Also has dm, hyperlipidemia. Last plavix was taken yesterday.   Physical Exam:   GEN well developed, well nourished, no acute distress    HEENT pale conjunctivae    NECK supple    RESP clear BS    CARD Regular rate and rhythm  Murmur    Murmur Systolic    Systolic Murmur Out flow    ABD denies tenderness    LYMPH negative neck    EXTR negative cyanosis/clubbing, negative edema    SKIN normal to palpation    NEURO cranial nerves intact, motor/sensory function intact    PSYCH A+O to time, place, person   Review of Systems:   Subjective/Chief Complaint dizziness and fatigue with mild chest pain and dark stools.    General: Fatigue  Weakness    Skin: No Complaints    ENT: No Complaints    Eyes: No Complaints    Neck: No Complaints    Respiratory: No Complaints    Cardiovascular: Tightness    Gastrointestinal: Black tarry stools    Genitourinary: No Complaints    Vascular: No Complaints    Musculoskeletal: No Complaints    Neurologic: No Complaints    Hematologic: No Complaints    Endocrine: No Complaints    Psychiatric: No Complaints    Review of Systems: All other systems were reviewed and found to be negative    Medications/Allergies Reviewed Medications/Allergies reviewed     Cancer, Skin: left cheek-2012  Home Medications: Medication Instructions Status  omeprazole 40 mg oral delayed release capsule 1 cap(s) orally once a day Active  pravastatin 40 mg oral tablet 1 tab(s) orally once a day (at bedtime) Active  Plavix 75 mg oral tablet 1 tab(s) orally once a day Active  losartan 100 mg oral tablet 1 tab(s)  orally once a day Active  hydrochlorothiazide 25 mg oral tablet 1 tab(s) orally once a day Active  metoprolol succinate 50 mg oral tablet, extended release 1 tab(s) orally once a day Active  grape seed complex 1 tab(s)  once a day Active  ferrous sulfate 325 mg (65 mg elemental iron) oral tablet 1 tab(s) orally once a day Active  Garlic oral capsule 2993 milligram(s) orally once a day Active  similasan eye drops 1   once a day Active  Apidra SoloStar Pen 100 units/mL subcutaneous solution 15 unit(s) subcutaneous once a day Active  Lantus 100 units/mL subcutaneous solution 65 unit(s) subcutaneous every am and 22u every pm Active   EKG:   EKG NSR    Abnormal NSSTTW changes    Sulfa drugs: Alt Ment Status, Rash  PCN: Rash    Impression Pt with history of cad s/p pci with a bare metal stent placed in 7/13 who now is admitted with anemia and dark tarry stools consistant with gi bleed. She is hemodynamically stable but with chest pain with her anemia. Her stent was greater than 4 weeks ago and was a bms so discontiuing plavix is appropriate. Would prefer her to take an 81 mg enteric coated asa is acceptable form gi standpoint and bleeding is stable. OK for patient to proceed  with egd/colonoscopy for diagnostic purposes form cardiac standpoint. Minimal troponin elevation does not appear to be seocndary to an acute coronary event and is likely due to her anemia. Would agree with transfusion to get hgb closer to 10 in a patient with cardiac history .    Plan 1. Agree with transfusion 2. Disconitnue plavix 3. ASA 81 mg daily if bleeding is stable and acceptable from gi standpoint 4. Conitnue other meds   Electronic Signatures: Teodoro Spray (MD)  (Signed 06-Dec-13 13:53)  Authored: General Aspect/Present Illness, History and Physical Exam, Review of System, Past Medical History, Home Medications, EKG , Allergies, Impression/Plan   Last Updated: 06-Dec-13 13:53 by Teodoro Spray (MD)

## 2015-04-15 NOTE — Consult Note (Signed)
Brief Consult Note: Diagnosis: Acute anemia-symptomatic, heme pos, probable UGI bleed, CAD recent stent in June presents on Plavix.   Patient was seen by consultant.   Consult note dictated.   Recommend further assessment or treatment.   Discussed with Attending MD.   Comments: Transfuse 2 u PRBC. Sips of water and then NPO for EGD after transfusion- either late afternoon today or tomorrow. High dose PPI.  Dr. Ubaldo Glassing was consulted and case disussed with him. This case discussed with Dr. Vira Agar in collaboration of care.  Electronic Signatures: Gershon Mussel (NP)  (Signed 06-Dec-13 12:44)  Authored: Brief Consult Note   Last Updated: 06-Dec-13 12:44 by Gershon Mussel (NP)

## 2015-04-18 NOTE — H&P (Signed)
PATIENT NAME:  Joyce Potter, Joyce Potter MR#:  [id number] DATE OF BIRTH:  [birth year]  DATE OF ADMISSION:  05/03/2013  PRIMARY CARE PHYSICIAN: Dr. Maryland Pink, M.D.   CHIEF COMPLAINT: Weakness, low hemoglobin.   HISTORY OF PRESENT ILLNESS: The patient is a 69 year old female with past medical history of coronary artery disease, status post PCA one stent placed in June 2013 and repeated cardiac catheterization in February 2014 with no change in cath, insulin-dependent diabetes mellitus, gastritis, iron deficiency anemia, hypertension, hyperlipidemia, benign thyroid nodule, repeated GI bleeds.  Had multiple admissions in last few months here and had 6 blood transfusions in the last few months because of this.  Her primary source of bleeding suspected to be GI but colonoscopy and endoscopy were done twice and capsule endoscopy was also done.  All of them failed to find the primary source of bleeding in GI tract.  Present repeatedly with symptoms of excessive tiredness, palpitation and feeling weak due to anemia and gradual hemoglobin drop. She was deferred by primary care physician to Dr. Ma Hillock for hematology workup. He ordered a few blood test to rule out hemolytic or iron deficiency workups and was supposed to go for followup today and she went to hematology clinic, the nurse spoke to Dr. Ma Hillock over the phone as he was in meeting in clinic and there was a drop in hemoglobin in the last 2 to 3 days from 8.3 to 7.5 and so she was advised to come to the Emergency Room to get admitted and blood transfusion and do further workup because of her cardiac history. On questioning, the patient says she has been feeling weak and  tired for the last few days and this is the same feeling she had every time when her hemoglobin drops.   REVIEW OF SYSTEMS:  CONSTITUTIONAL: Positive for fatigue negative for fever or weight loss.   EYES: No blurring, double vision or discharge.   EARS: No tinnitus, hearing loss or any  discharge from the ears.   RESPIRATORY: No cough, wheezing, or shortness of breath or hemoptysis.   CARDIOVASCULAR: No chest pain, orthopnea, edema or arrhythmia.   GASTROINTESTINAL: No nausea, vomiting, diarrhea or abdominal pain. Has some dark-colored stool and as per she and her family, her stool is always positive for blood.   GENITOURINARY: No dysuria, hematuria or increased frequency of the urination.   ENDOCRINOLOGY: No increased heat or cold intolerance.   SKIN: No acne or rashes.   MUSCULOSKELETAL: No pain or swelling in the joints.   NEUROLOGICAL: No numbness, weakness, dysarthria, or epilepsy   PAST MEDICAL HISTORY:  1. Coronary artery disease status post PCA one stent in June 2000 and repeat cardiac catheterization in February 2014, no change in cath.  2. Insulin-dependent diabetes mellitus and Lantus 70 units every day in the morning.  3. Mild gastritis with H. pylori positive in the past treated with antibiotics by Dr. Vira Agar.  4. Iron deficiency anemia, currently under workup by Dr. Ma Hillock.  5. Hypertension, hyperlipidemia, benign thyroid nodule status post biopsy.   PAST SURGICAL HISTORY: Cataract repair surgery, PCI one stent and hysterectomy.   ALLERGIES: PENICILLIN AND SULFA.   SOCIAL HISTORY: Lives at home. Does not smoke.  No alcohol, no illicit drugs.  CODE STATUS: Full code.   FAMILY HISTORY: Both parents had diabetes. Mother had breast cancer.   HOME MEDICATIONS:  1. Aspirin 81 mg once a day. 2. Carafate 1 gram oral tablet 4 times a day before meals and at bedtime.  3. Lantus 70 units subcutaneous once a day.  4. Losartan 100 mg once a day.  5. Metoprolol 25 mg oral tablet 2 times a day.  6. Pravastatin 40 mg oral tablet once a day.  7. Protonix 40 mg oral delayed-release tablet once a day.  8. Vitamin C.  VITAL SIGNS:  In ER, temperature 98.6, pulse 69, respirations 18, and blood pressure 141/60 and pulse oximetry 96 on room air.   GENERAL: The  patient is fully alert, oriented to time, place and person, in no acute distress.   HEENT: Head and neck conjunctivae pale. Oral mucosa moist.   NECK: Supple. No JVD.   RESPIRATORY: Bilateral clear and equal air entry.   CARDIOVASCULAR: S1, S2 present, regular. No murmur.   ABDOMEN: Soft, nontender. Bowel sounds present. No organomegaly.   SKIN: No rashes.   JOINTS: No swelling or tenderness.   LEGS: No edema.   NEUROLOGICAL: Power 5/5. No tremors.   PSYCHIATRIC: Does not appear in any acute psychiatric illness at this time.   LABORATORY RESULTS:  Glucose 77, BUN 10, creatinine 0.58, sodium 144, potassium 3.9, chloride 109, CO2 of 28, calcium 8.6, total protein 6.5, albumin 3.4, bilirubin 0.3, SGOT 28, SGPT 21. Troponin less than 0.02. WBC 4.9, hemoglobin was 8.3 on 6th of May and today is 7.5, hematocrit 22.3, RDW 15.5, platelet count 304. INR 1. Urinalysis; 7 WBCs and 1+ leukocyte esterase.   ASSESSMENT AND PLAN: 1. Symptomatic anemia: Due to chronic gastrointestinal loss, possible iron deficiency anemia under workup by hematologist: Has history of coronary artery disease and stent placement, so we will target her hemoglobin level at 9 as she is symptomatic with 7.5 level. We will transfuse 1 unit.  Discussed with patient about possible side effects and reaction with blood transfusion. She agrees to receive 1 unit of blood transfusion for now. We will call Dr. Ma Hillock for consult in the hospital and Dr. Vira Agar for possible gastrointestinal bleed.  Extensive workup for gastrointestinal was done in the past.  2. Palpitation and weakness secondary to anemia and possible gastrointestinal blood loss. We will transfuse hemoglobin as she is symptomatic and she has coronary artery disease.  3. Coronary artery disease. We will continue her aspirin as cardiologist decided to continue it after the past episode of gastrointestinal bleed. She was taken off Plavix in the past due to gastrointestinal  bleed.  4. Hypertension. We will continue metoprolol and losartan.  5. Deep vein prophylaxis with sequential compression devices. 6. Diabetes. We will give her Lantus 50 units once a day, insulin sliding scale coverage.  7. Hyperlipidemia. We will continue pravastatin as she is taking at home.  8. History of gastritis.  We will continue pantoprazole and sucralfate and gastrointestinal consult for further workup if needed any for her possible gastrointestinal bleed.   Total Time Spent on this admission: 50 minutes     ____________________________ Ceasar Lund. Anselm Jungling, MD vgv:rw D: 05/03/2013 18:48:55 ET T: 05/03/2013 20:05:36 ET JOB#: [id number]  cc: Ceasar Lund. Anselm Jungling, MD, <Dictator> Irven Easterly. Kary Kos, MD  Vaughan Basta MD ELECTRONICALLY SIGNED 05/10/2013 6:23

## 2015-04-18 NOTE — Discharge Summary (Signed)
PATIENT NAME:  Joyce Potter, Joyce Potter MR#:  [id number] DATE OF BIRTH:  [birth year]  DATE OF ADMISSION:  04/02/2013 DATE OF DISCHARGE:  04/04/2013  PRESENTING COMPLAINT: Weakness, palpitations.   DISCHARGE DIAGNOSES:  1. Acute on chronic anemia secondary to iron deficiency and slow gastrointestinal bleed.  2. Acute gastritis and mild esophagitis as noted on esophagogastroduodenoscopy done on 04/04/2013 by Dr. Vira Agar.  3. Hypertension.   CONDITION ON DISCHARGE: Fair.   MEDICATIONS:  1. Losartan 100 mg daily.  2. Lantus 65 units at breakfast, 22 units at bedtime.  3. Pravastatin 40 mg daily.  4. Metoprolol 25 mg b.i.d.  5. Tramadol 50 mg q.6 hours.  6. Ferrous sulfate 325 mg 1 tablet 3 times a day.  7. Protonix 40 mg daily.  8. Carafate 1 gram 1 tablet 4 times a day.   DIET: Low sodium, mechanical soft.   FOLLOWUP:  1. Follow up with Dr. Ma Hillock in 1 to 2 weeks.  2. Follow up with Dr. Vira Agar in 2 to 4 weeks.  3. Follow up with primary care physician, Dr. Kary Kos.  PROCEDURE: Upper gastrointestinal endoscopy done on 04/04/2013 showed:  1. LA grade A reflux esophagitis.  2. Gastritis.  3. Normal exam of duodenum.   LABORATORY DATA: Hemoglobin at discharge is 9.4. Serum iron is 26, iron-binding capacity is 288. Vitamin B12 is 1139. Folic acid is 93.7. Retic count is 6.2. H and H dropped down to 7.2. The patient presented with H and H of 7.8 and 24.7. Methylmalonic acid serum is 134.   IMAGING: CT of the head showed no evidence of acute intracranial hemorrhage or evolving ischemic infarction.   BRIEF SUMMARY OF HOSPITAL COURSE: The patient is a pleasant [name] with history of hypertension and diabetes and history of anemia of chronic disease, who is followed by Dr. Ma Hillock as outpatient, who comes in with:   1. Symptomatic acute on chronic anemia, which is suspected secondary to slow gastrointestinal bleed. She was positive for melena, and stool was positive for  Hemoccult. No frank GI bleeding was noted. The patient was admitted on the medical floor. Her hemoglobin dropped from 8.6 to 7.2. She received 2 units of blood transfusion. Hemoglobin came up to 9.4. She had IV ferriheme in February of 2014 by Dr. Ma Hillock. She follows with him as outpatient. Dr. Ma Hillock is considering again IV iron therapy to be started as outpatient. GI workup was done in the past and was negative. However, with her heme-positive stools, she underwent EGD, which showed acute gastritis along with mild esophagitis. She was continued on Protonix and iron pills.  2. Palpitations, lightheadedness secondary to anemia and gastrointestinal bleed.  3. Coronary artery disease. The patient had recent cath with no stent being placed. We did hold aspirin at this time secondary to slow gastrointestinal bleed.  4. Hypertension. Metoprolol was continued. 5. Type 2 diabetes. The patient's insulin was resumed at discharge.  6. Hospital stay otherwise remained stable. The patient will follow up closely with Dr. Ma Hillock and Dr. Vira Agar as outpatient for her anemia.   TIME SPENT: 40 minutes.    ____________________________ Hart Rochester Posey Pronto, MD sap:OSi D: 04/05/2013 07:31:51 ET T: 04/05/2013 08:05:37 ET JOB#: [id number]  cc: Dalyn Kjos A. Posey Pronto, MD, <Dictator> Sandeep R. Ma Hillock, MD Manya Silvas, MD Irven Easterly. Kary Kos, MD Ilda Basset MD ELECTRONICALLY SIGNED 04/16/2013 6:57

## 2015-04-18 NOTE — Consult Note (Signed)
Plan EGD tomorrow afternoon, clear liq breakfast in morning. then NPO.  Electronic Signatures: Manya Silvas (MD)  (Signed on 08-Apr-14 17:36)  Authored  Last Updated: 08-Apr-14 17:36 by Manya Silvas (MD)

## 2015-04-18 NOTE — H&P (Signed)
DATE OF BIRTH:  [birth year]  DATE OF ADMISSION:  04/02/2013  PRIMARY CARE PHYSICIAN:  Jarome Lamas, MD  CHIEF COMPLAINT:  Palpitations and dizziness, with melena.   HISTORY OF PRESENT ILLNESS:  A [name] patient with history of iron deficiency anemia, mild gastritis, coronary artery disease, hypertension, presents to the Emergency Room complaining of 3 days of progressively worsening palpitations and dizziness. The patient mentions that this dizziness is a room-spinning sensation and not lightheadedness, but has also had melena. Patient mentions that she has had melena previously, prior to admission, diagnosed with mild gastritis and GI bleed, but also after being started on the iron pills. In the Emergency Room, stool Hemoccult was positive. The patient has decreased hemoglobin at 7.8 from 8.7, and symptomatic, and the patient is being admitted.   The patient was admitted in December 2013 for GI bleed and anemia, which had similar symptoms as now. The patient had mild gastritis on the EGD with colonoscopy and capsule endoscopy being normal. The patient does follow with Dr. Ma Hillock, and was told that she has iron deficiency anemia, and is on iron pills twice a day.   The patient was also treated for an H. pylori positive infection after being found to have mild gastritis.   PAST MEDICAL HISTORY: 1. Coronary artery disease, status post PCA, one stent placed in June 2013. Repeat cardiac cath in February 2014, with no change on the cath.  2.  Insulin-dependent diabetes mellitus, on Lantus.  3.  Mild gastritis, with H. pylori positive.  4.  Iron deficiency anemia.  5.  Hypertension.  6.  Hyperlipidemia.  7.  Benign thyroid nodule, status post biopsy.   PAST SURGICAL HISTORY:  Cataract repair, PCI with one stent, and hysterectomy.   ALLERGIES:  The patient is allergic to PENICILLIN, which causes rash, and SULFA, which causes confusion.   SOCIAL HISTORY:  The patient lives at  home. Does not smoke. No alcohol. No illicit drugs.   CODE STATUS:  Full code.   FAMILY HISTORY:  Both parents had diabetes mellitus. Mother had history of breast cancer.   HOME MEDICATIONS INCLUDE: 1.  Aspirin 325 mg oral once a day.  2.  Lantus 65 units subcutaneous in the morning and 32 units at bedtime.  3.  Losartan 100 mg oral once a day.  4.  Metoprolol tartrate 25 mg oral 2 times a day.  5.  Omeprazole 20 mg oral 2 times a day.  6.  Pravastatin 40 mg oral once a day.   REVIEW OF SYSTEMS:   CONSTITUTIONAL:  Complains of fatigue, weakness. No weight loss, weight gain.  EYES:  No blurred vision, pain, redness.  EARS, NOSE, THROAT:  No tinnitus, ear pain, hearing loss.  RESPIRATORY:  No cough, wheeze, hemoptysis.  CARDIOVASCULAR:  No chest pain, orthopnea, edema. Does have exertional shortness of breath.  GASTROINTESTINAL:  No nausea, vomiting, diarrhea, abdominal pain. Does have melena and heme-positive stools, with mild gastritis in the past.  GENITOURINARY:  No dysuria, hematuria, frequency.  ENDOCRINE:  No polyuria, nocturia, thyroid problems.  HEMATOLOGIC AND LYMPHATIC:  Has iron deficiency anemia, but no easy bruising, bleeding.  INTEGUMENTARY:  No acne, rash, lesions.  MUSCULOSKELETAL:  No arthritis, back pain.  NEUROLOGIC:  No focal numbness, weakness. Does complain of dizziness, lightheadedness.  PSYCHIATRIC:  No anxiety, depression.   PHYSICAL EXAMINATION: VITAL SIGNS: Shows temperature 98.5, pulse of 90, respirations 20, blood pressure 146/66, saturating 96% on room air.  GENERAL:  Obese Caucasian female  patient lying in bed, comfortable, conversational, cooperative with exam.  PSYCHIATRIC:  Alert, oriented x 3. Mood and affect appropriate. Judgment intact.  HEENT:  Atraumatic, normocephalic. Oral mucosa moist and pink. External ears and nose normal. Pallor positive. No icterus. Pupils bilaterally equal and react to light.  NECK:  Supple. No thyromegaly. No palpable  lymph nodes. Trachea midline. No carotid bruit, JVD.  CARDIOVASCULAR:  S1, S2, with a systolic murmur. Peripheral pulses 2+. No edema.  RESPIRATORY:  Normal work of breathing. Clear to auscultation on both sides.  GASTROINTESTINAL: Soft abdomen, nontender. Bowel sounds present. No hepatosplenomegaly palpable.  SKIN:  Warm and dry. No petechiae, rash, ulcers.  GENITOURINARY:  No CVA tenderness or bladder distention.  NEUROLOGICAL:  Motor strength 5/5 in upper and lower extremities. Sensation to fine touch  intact all over.  LYMPHATIC:  No cervical lymphadenopathy.   LAB STUDIES:  Show glucose of 114, BUN 14, creatinine 0.58, sodium 140, potassium 3.9. AST, ALT, alkaline phosphatase normal. Troponin less than 0.02. WBC 6, hemoglobin 7.8, platelets of 337. Last hemoglobin on February 17 was 9.4. Ferritin in February 2014 was 7.   EKG shows normal sinus rhythm, poor R-wave progression.   CT head without contrast shows no acute abnormalities.   Chest PA and lateral:  No acute abnormalities.   ASSESSMENT AND PLAN: 1.  Symptomatic acute blood loss anemia secondary to gastrointestinal bleed. The patient does have melena and stool positive for Hemoccult. No frank blood noticed, but considering patient is symptomatic, will check orthostatics, start her on IV fluids. The patient does not need any acute transfusion, but will hold a unit of blood. Hemoglobin is 7.8, which is a drop of 2 units from a month back in February 2014. Will consult GI to see if patient needs another endoscopy. Will also call Oncology, as patient might need IV iron with her ferritin being extremely low at 7.   2.  Palpitations and lightheadedness secondary to anemia and gastrointestinal bleed.   3.  Coronary artery disease. The patient did have recent cath, with no stents being placed. Will have to hold the aspirin at this time secondary to the gastrointestinal bleed.   4.  Hypertension. Continue metoprolol. Will hold the  losartan, as patient's blood pressure is well- controlled at this time.   5.  Deep vein prophylaxis.  SCDs, TEDs.  No heparin products.   6.  Code status:  FULL CODE.   Time spent today on this case was 40 minutes.     ____________________________ Leia Alf Kellee Sittner, MD srs:mr D: 04/02/2013 17:30:23 ET T: 04/02/2013 19:10:38 ET JOB#: [id number]  cc: Alveta Heimlich R. Zayla Agar, MD, <Dictator> Irven Easterly. Kary Kos, MD Sandeep R. Ma Hillock, MD Manya Silvas, MD    Neita Carp MD ELECTRONICALLY SIGNED 04/09/2013 19:08

## 2015-04-18 NOTE — Consult Note (Signed)
PATIENT NAME:  Joyce Potter, Joyce Potter MR#:  [id number] DATE OF BIRTH:  12-14-1946  DATE OF CONSULTATION:  05/04/2013  CONSULTANT:  Andria Meuse, NP  REQUESTING PHYSICIAN:  Dr. Vaughan Basta  PRIMARY CARE PHYSICIAN:  Dr. Maryland Pink  PRIMARY GASTROENTEROLOGIST:  Dr. Vira Agar   REASON FOR CONSULTATION:  Chronic obscure GI bleed.  HISTORY OF PRESENT ILLNESS:  [name] is a [name] with history of chronic iron deficiency anemia and chronic obscure GI bleed. She has had extensive GI workup recently under the direction of Dr. Vira Agar and Dr. Gustavo Lah, and she has also been to [facility] in [place]. She has required 6 transfusions prior to this admission for GI bleeding. She was admitted with a hemoglobin of 7.5 today, it was 8.3 yesterday. She has been followed by Dr. Ma Hillock for IV iron and Procrit since March of this year. She began to notice fatigue and palpitations, and was complaining of weakness. She is on aspirin 81 mg daily, with a history of coronary artery disease, status post PTCA June 2013. She was previously on Plavix, but that was discontinued due to bleeding. She does take Protonix 40 mg daily. She has been noticing dark melanotic stools intermittently. She has some transient nausea. Denies any vomiting. She has been off PPI for an upcoming H. pylori stool antigen, she believes. She was complaining of occasional loose stools. She denies any other NSAIDs. She denies any anorexia or weight loss.  Previous GI workup has included her last colonoscopy December 2013, which showed internal hemorrhoids by Dr. Vira Agar.  She had an EGD 04/04/2013, which showed grade A erosive reflux esophagitis and gastritis. She had a previous colonoscopy August 2011, which showed diverticulosis and a benign papilliform lesion at her appendix. Last capsule study in [place] was normal, and she had a previous Given capsule study of the small bowel, which showed dark blood in the  distal ileum by Dr. Vira Agar. She has been treated for H. pylori in the past, but cannot remember when.   PAST MEDICAL/SURGICAL HISTORY:  Obscure GI bleed, chronic iron deficiency anemia, on parenteral iron under direction of Dr. Ma Hillock, coronary artery disease, status post PTCA June 2013, diabetes mellitus, gastritis, EGD and colonoscopy as above, hyperlipidemia, thyroid nodule, hypertension, peptic ulcer disease in 2007, cataract surgery and partial hysterectomy with unilateral salpingo-oophorectomy.  MEDICATIONS PRIOR TO ADMISSION:   1.  Aspirin 81 mg daily. 2.  Carafate 1 gram q.i.d. 3.  Lantus 70 units subcutaneous daily. 4.  Losartan 100 mg daily. 5.  Metoprolol 25 mg b.i.d. 6.  Prilosec 40 mg daily. 7.  Protonix 40 mg daily. 8.  Vitamin C daily.  ALLERGIES:  PENICILLIN caused a rash, SULFA caused dizziness.  FAMILY HISTORY:  There is no known family history of rectal cancer in first-degree relatives. She did have multiple paternal uncles (59) with colon cancer. Mother and father both had diabetes, and mother had history of breast cancer.  SOCIAL HISTORY:  She denies any tobacco, alcohol, or drug use. She is married.  REVIEW OF SYSTEMS:  See HPI, otherwise negative complete review of systems.  PHYSICALEXAMINATION:   VITAL SIGNS:  Temperature  98.1, pulse 67, respirations 20, blood pressure 164/68. GENERAL: She is an obese Caucasian female who is alert and oriented, pleasant and cooperative, in no acute distress. HEENT:  Sclerae clear, non-icteric. Conjunctivae pink. Oropharynx pink and moist, without any lesions. NECK:  Supple, without mass or thyromegaly. HEART:  Regular rate and rhythm, with a 2/6 murmur noted.  LUNGS:  Clear to auscultation bilaterally. ABDOMEN:  Protuberant, with positive bowel sounds x 4. No bruits auscultated. The abdomen is soft, nontender, nondistended, without palpable mass or hepatosplenomegaly. No rebound tenderness or guarding. EXTREMITIES:  Without edema  bilaterally. SKIN:  Pink, warm and dry, without any rash or jaundice.  LABORATORY STUDIES:  Glucose 130, creatinine 0.54, chloride 110, calcium 8.2, otherwise normal basic metabolic panel. Normal LFTs. Negative troponin. White blood cell count 4.2, hemoglobin 8.5, hematocrit 25.1, platelets 282, MCV 88. INR 1.   IMPRESSION:  [name] is a pleasant, [name] with chronic obscure gastrointestinal bleeding and transfusion-dependent iron deficiency anemia, requiring parenteral iron under the direction of Dr. Ma Hillock. She has had an extensive GI workup both here with Dr. Vira Agar and Dr. Gustavo Lah, as well as in [place], including multiple colonoscopies, EGDs, small bowel capsule studies, and CT scans as above. The only potential abnormality noted on all of the above studies that could be a culprit of GI bleeding was dark blood noted on the Given capsule study of the small bowel in the distal ileum. Differentials include Dieulafoy ` small bowel arteriovenous malformations. I have discussed her extensive workup in depth with her as well as Dr. Verl Blalock. Dr. Verl Blalock has discussed with both Dr. Vira Agar and Dr. Ma Hillock. She has a pending referral to [facility]. They will likely want to repeat endoscopy studies there and possibly perform a small bowel enteroscopy, an intervention that is unavailable here in [place]. At this point, supportive measures including blood transfusions as needed, given she is symptomatic with history of coronary artery disease and stent, parenteral iron, and close monitoring of hemoglobin is warranted while she awaits her appointment at [facility]. We will gladly see her in outpatient followup if needed prior to her appointment at [facility], or she can follow up with Dr. Vira Agar in the interim. All questions were answered, and all parties are agreeable. We would like to thank you for allowing [place] to participate in the  care of Joyce Potter.  PLAN:   1.  Continue supportive measures. 2.  Agree with blood transfusion to keep hemoglobin around 9 grams, as patient is symptomatic. 3.  Follow up with Dr. Ma Hillock for IV iron and Procrit. 4.  Monitor hemoglobin closely. 5.  Outpatient follow up with GI while awaiting outpatient [facility] gastroenterology consult for obscure GI bleeding.   ____________________________ Andria Meuse, NP klj:mr D: 05/04/2013 17:15:00 ET T: 05/04/2013 22:49:54 ET JOB#: [id number]  cc: Andria Meuse, NP, <Dictator> Irven Easterly. Kary Kos, [place] SIGNED 05/11/2013 11:22

## 2015-04-18 NOTE — Consult Note (Signed)
PATIENT NAME:  Joyce Potter, Joyce Potter MR#:  [id number] DATE OF BIRTH:  11/20/1946  DATE OF CONSULTATION:  04/02/2013  REFERRING PHYSICIAN:   CONSULTING PHYSICIAN:  Manya Silvas, MD  The patient is a 69 year old white female, who presented to the hospital with a chief complaint of weakness, palpitations and anemia. She had similar presentations in the past and had a complete workup last year including an upper, a lower and a capsule study. Upper showed minimal gastritis. In the past, she has had ulcer disease.    She did present with heme-positive stool and dark stool and for these reasons I was consulted.   The patient's last hemoglobin before this presentation was 8.7. She was previously admitted in December her for GI bleed and anemia. In the past, she had been treated for H. pylori infection.   PAST MEDICAL HISTORY:  1.  Insulin-dependent diabetes, on Lantus insulin.  2.  Mild gastritis.  3.  Iron deficiency anemia.  4.  Hypertension.  5.  Coronary artery disease, status post PCA, one stent placed June 2013; bare metal stent. Repeat catheterization in February 2014 with no change.  6.  Hyperlipidemia.  7.  Benign thyroid nodule.   PAST SURGICAL HISTORY:  1.  Cataract surgery.  2.  Hysterectomy.   ALLERGIES: PENICILLIN AND SULFA.   SOCIAL HISTORY: She does not smoke and does not drink.   FAMILY HISTORY: Mother had breast cancer. Diabetes runs in both parents.   HOME MEDICATIONS: Omeprazole 20 mg 2 times a day, metoprolol 25 mg 2 times a day, losartan 100 mg once a day, Lantus subcutaneous the morning 65 units and 32 units at bedtime, aspirin 325 mg a day and pravastatin 40 mg once a day.   REVIEW OF SYSTEMS: Positive for fatigue, weakness and dizziness. Negative for chest pain. Positive for shortness of breath. No urinary complaints. She follows with Dr. Ma Hillock for her anemia.   PHYSICAL EXAMINATION:  GENERAL: White female who looks little pale in no acute distress, lying in  bed. VITAL SIGNS: Blood pressure at 10:00 last night was 157/79, blood pressure at 4:30 this morning was 106/65, temperature 97.6, pulse 60.  HEENT: Sclerae anicteric. Conjunctivae somewhat pale. Head is atraumatic.  NECK: There were no carotid bruits.  CHEST: Clear.  HEART: Shows a 1/6 systolic murmur.  ABDOMEN: Moderately obese. Bowel sounds present. No hepatosplenomegaly. No masses. No bruits. No significant tenderness.  RECTAL: Done by the ER physician showed dark heme-positive stool.   ASSESSMENT: Iron deficiency anemia, etiology uncertain. Heme-positive stool, etiology uncertain. The patient is having symptoms and with a history of coronary artery disease, I think it is important to go ahead and transfuse her a unit and then possibly a second unit. This should be done before doing the upper endoscopy for evaluation. We will go back and review her capsule study and see if anything at all was found on that. Notify Dr. Ma Hillock of her admission. I will follow with you.   ____________________________ Manya Silvas, MD rte:aw D: 04/03/2013 07:23:00 ET T: 04/03/2013 07:40:26 ET JOB#: [id number]  cc: Manya Silvas, MD, <Dictator> Sona A. Posey Pronto, MD Irven Easterly. Kary Kos, MD Manya Silvas MD ELECTRONICALLY SIGNED 05/09/2013 14:09

## 2015-04-18 NOTE — Consult Note (Signed)
Chief Complaint:  Subjective/Chief Complaint The patient has a history of occult GI bleeding with a negative work up. Sh ehas been transfused and her Hb is stable. No reports of any pain.   Brief Assessment:  Respiratory normal resp effort   Additional Physical Exam NAD A&O x 3.   Lab Results: Routine Hem:  11-May-14 05:27   WBC (CBC) 4.7  RBC (CBC)  2.99  Hemoglobin (CBC)  9.0  Hematocrit (CBC)  26.7  Platelet Count (CBC) 284  MCV 89  MCH 30.1  MCHC 33.8  RDW  16.1  Neutrophil % 54.4  Lymphocyte % 26.7  Monocyte % 9.4  Eosinophil % 8.1  Basophil % 1.4  Neutrophil # 2.5  Lymphocyte # 1.2  Monocyte # 0.4  Eosinophil # 0.4  Basophil # 0.1 (Result(s) reported on 06 May 2013 at 06:01AM.)   Assessment/Plan:  Assessment/Plan:  Assessment Occult GI bleed.   Plan The patient has had 2 capsule endoscopies, upper endoscopies and colonoscopies without a cause found. The patient has planned to be seen at [facility]. She will not have the procedures repeated this admission because it is likely that they will also repeat the procedures. The patient and her husband have been explained this and should continue transfusions as needed and iron replacement.   Electronic Signatures: Lucilla Lame (MD)  (Signed 11-May-14 11:02)  Authored: Chief Complaint, Brief Assessment, Lab Results, Assessment/Plan   Last Updated: 11-May-14 11:02 by Lucilla Lame (MD)

## 2015-04-18 NOTE — Discharge Summary (Signed)
Dates of Admission and Diagnosis:   Date of Admission 09-Jan-2013    Date of Discharge 10-Jan-2013    Admitting Diagnosis chest pain/anemia    Final Diagnosis coronary artery disease    Discharge Diagnosis 1 anemia    2 diabetes mellitus    3 hyperlipidemia     Chief Complaint/History of Present Illness Pt wiht anemia and history of cad. Presented to the office with chest pain. Was noted to have hgb of 7.45 several days earlier. Was having exertional chest pain. Admitted to consider transfusion and possible ischemic work up.   Hospital Course:   Hospital Course Pt ruled out for an mi and hgb was 8.4 and 8.7 after admission with no transfusion. She underwent cardiac cath due to exertional chest pain and history of stent in lcx in 6/13. Cath revealed 60-70% instent stenosis of the lcx with 50-60 % stenosis of lad and insignificant lad diseasse with 80% ostial small d1. Felt to be medical management candidate at least initially due to diffiuclty in using antiplatelet therapy with history of anemia. Did well post cath    Condition on Discharge Good   DISCHARGE INSTRUCTIONS HOME MEDS:  Medication Reconciliation:  Patient's Home Medications at Discharge:     Medication Instructions  omeprazole 40 mg oral delayed release capsule  1 cap(s) orally once a day   pravastatin 40 mg oral tablet  1 tab(s) orally once a day (at bedtime)   losartan 100 mg oral tablet  1 tab(s) orally once a day   hydrochlorothiazide 25 mg oral tablet  1 tab(s) orally once a day   apidra solostar pen 100 units/ml subcutaneous solution  15 unit(s) subcutaneous once a day   aspir 81 81 mg oral delayed release tablet  1 tab(s) orally once a day   ferrous sulfate 325 mg (65 mg elemental iron) oral tablet  2 tab(s) orally once a day   lantus 100 units/ml subcutaneous solution  65 unit(s) subcutaneous every am and 22 units every night   metoprolol succinate 50 mg oral tablet, extended release  0.5 tab(s) orally 2 times  a day     Physician's Instructions:   Home Health? No    Treatments None    Dressing Care Remove dressing tomorrow.  May shower.    Home Oxygen? No    Diet Carbohydrate Controlled (ADA) Diet    Activity Limitations No exertional activity  No heavy lifting    Return to Work after follow up visit with MD    Time frame for Follow Up Appointment 1-2 weeks   Electronic Signatures: Teodoro Spray (MD)  (Signed 15-Jan-14 17:26)  Authored: [place] DIAGNOSIS, CHIEF COMPLAINT/HPI, HOSPITAL COURSE, [place] [facility], PATIENT INSTRUCTIONS   Last Updated: 15-Jan-14 17:26 by Teodoro Spray (MD)

## 2015-04-18 NOTE — Consult Note (Signed)
PATIENT NAME:  Potter, Joyce R MR#:  [id number] DATE OF BIRTH:  07/17/1946  DATE OF CONSULTATION:  05/04/2013  REFERRING PHYSICIAN:  Vaibhavkumar Vachhani, MD CONSULTING PHYSICIAN:  Sandeep R. Pandit, MD  REASON FOR CONSULTATION: Chronic anemia, symptomatic.   HISTORY OF PRESENT ILLNESS: The patient is a 69-year-old female with history of multiple medical problems including hypertension, diabetes, hyperlipidemia, allergies, cataracts, palpitations, arthritis, history of bleeding ulcers, partial hysterectomy, coronary artery disease status post stent placement in July 2013, on baby aspirin, thyroid and breast biopsies in the past, recurrent transfusion and hospitalization for GI bleed in the last few months with reportedly unremarkable colonoscopy, capsule endoscopy. Last EGD showed mild gastritis. The patient has again been hospitalized with drop in hemoglobin down to 7.5 with increasing palpitation, dyspnea. She has received transfusion overnight and hemoglobin today is better at 8.5, WBC count 4200 and platelets normal at 282, differential is unremarkable, creatinine normal at 0.54. The patient had detailed anemia work-up in February 2014 which was consistent with iron deficiency anemia, she received Feraheme at that time. Again, iron studies repeated on April 03, 2013 showed continued iron deficiency with serum iron of 26, and iron saturation of 9%, and the patient has received a course of IV Feraheme x 2 doses (on April 16th and second dose more recently on April 6th). States that she continues to have dark-colored stools, although she does not take oral iron. GI consultation during this hospitalization is pending. Denies any other bleeding symptoms. No fevers or night sweats.   PAST MEDICAL AND SURGICAL HISTORY: As in HPI above.   FAMILY HISTORY: Noncontributory.   SOCIAL HISTORY: Denies smoking or alcohol usage. Lives at home.   ALLERGIES: PENICILLIN, SULFA.   MEDICATIONS:   1.  Protonix 40  mg once daily. 2.  Aspirin 81 mg once daily. 3.  Carafate 1 gram q.i.d.  4.  Lantus insulin 70 units sub-Q once daily. 5.  Losartan 100 mg daily. 6.  Metoprolol 25 mg b.i.d.  7.  Pravastatin 40 mg daily.  8.  Vitamin C daily.   REVIEW OF SYSTEMS: CONSTITUTIONAL: As in HPI. She has generalized fatigue and weakness from anemia and heart disease. No fevers or night sweats.  HEENT: Denies any headaches or dizziness at rest. No epistaxis, ear or jaw pain.  CARDIAC: As in HPI.  No orthopnea or PND.  PULMONARY: No new cough, sputum, hemoptysis or chest pain.  GASTROINTESTINAL: No nausea, vomiting or diarrhea. Has dark stools. No bright red blood in stools or clots.  GENITOURINARY: No dysuria or hematuria.  SKIN: No new rashes or pruritus.  HEMATOLOGIC: Denies other bleeding symptoms.  NEUROLOGIC: No new focal weakness, seizures or loss of consciousness.  EXTREMITIES: No new swelling or pain.  ENDOCRINE: No polyuria or polydipsia. Appetite is steady.   PHYSICAL EXAMINATION: GENERAL: The patient is sitting at the side of the bed, moderately built and well nourished individual, alert and oriented and in no acute distress, converses appropriately. No icterus. Pallor present.  VITAL SIGNS: 97.9, 65, 18, 161/68, 99% on room air.  HEENT: Normocephalic, atraumatic. Extraocular movements intact. Sclera anicteric. No oral thrush.  NECK: Negative for lymphadenopathy. HEART:  S1 and S2, regular rate and rhythm.  LUNGS: Bilateral good air entry, no rhonchi. ABDOMEN: Soft, nontender. No hepatosplenomegaly clinically.  EXTREMITIES: No major edema or cyanosis.  SKIN: No generalized rashes or major bruising.  NEUROLOGIC: Cranial nerves are intact, moves all extremities spontaneously.  MUSCULOSKELETAL: No obvious joint deformity or swelling.   LABORATORY   RESULTS:  WBC 4200 with ANC 2100, ALC 1400, hemoglobin 8.5 with MCV 88, platelet count 282, creatinine 0.54, calcium 8.2, urine negative for blood, INR  1.0. B12 and folate on April 8th was unremarkable.   IMPRESSION AND RECOMMENDATION: A 69-year-old female patient with multiple medical problems including history of coronary artery disease with stent placement on aspirin therapy, persistent/recurrent iron deficiency anemia and has just completed a course of IV Feraheme (2 doses on April 16th and on May 6th) who has been admitted with quick drop in hemoglobin causing worsening of her cardiac symptoms. The patient currently is feeling better posttransfusion with hemoglobin also doing better at 8.5 grams. Given definite evidence of ongoing iron deficiency, plan from hematology standpoint has to wait until iron deficiency is adequately corrected and if she is still anemic then will need further investigation including bone marrow biopsy. We will get stool Hemoccult and reticulocyte count. Continue supportive packed red blood cell transfusions as indicated by her hemoglobin and symptoms. Gastroenterology evaluation is pending at this time. The patient states that she will discuss with gastroenterology and also consider further opinion at [facility] gastro[facility]ology as outpatient since she is concerned and tired of recurrent GI bleeding and persistent blood loss. She otherwise [facility] to 2 [facility] and if discharged over the weekend she was advised to keep those scheduled appointments the same. The patient also is on aspirin, but had cardiac stents done recently, last year.  Recommend discussing with her cardiologist regarding this issue. We will continue to follow as needed during hospitalization. If she is discharged soon, she was advised to [facility] same as previously scheduled. She is agreeable to this plan.   Thank you for the referral. Please feel free to contact me for additional questions.  ____________________________ Sandeep R. Pandit,  MD srp:sb D: 05/04/2013 14:05:34 ET T: 05/04/2013 14:54:06 ET JOB#: [id number]  cc: Sandeep R. Pandit, MD, <Dictator> SANDEEP R PANDIT MD ELECTRONICALLY SIGNED 05/04/2013 15:31 

## 2015-04-18 NOTE — Consult Note (Signed)
Brief Consult Note: Diagnosis: chronic obscure GI bleed.   Patient was seen by consultant.   Consult note dictated.   Discussed with Attending MD.   Comments: 69 y/o caucasian female with chronic obscure GI bleeding & transfusion dependent IDA requiring parenteral iron.  She has had extensive GI work-up both here with Dr Vira Agar & Gustavo Lah, as well as in [place], including multiple colonoscopies, EGDs, small bowel capsule studies & CT scans.  Only potential abnormality noted on all studies that could be culprit of GI bleeding was dark blood noted on capule study in distal ileum.  Differentials include Dieulafoy or small bowel AVMs.  I discussed with patient work-up indepth.  Dr Allen Norris has discussed w/ both Dr Vira Agar & Dr Ma Hillock.  She has a pending referral to [facility] where they will likely want to repeat endoscopy studies there & possibly perform small bowel enteroscopy & intervention that is unavailable here in [place].  At this point, supportive measures including blood transfusion given she is symptomatic with hx CAD & stent, IV iron, and close monitoring of hemoglobin is warranted while she awaits appt at [facility].  We will gladly see her in outpatient follow-up if needed prior to appt at [facility], or she can follow-up with Dr Vira Agar in the interim.  All questions were answered & all parties agreeable.  Thank you for allowing our participation in the care of Joyce Potter. #[id number].  Electronic Signatures: Andria Meuse (NP)  (Signed [id number] 17:16)  Authored: Brief Consult Note   Last Updated: 09-May-14 17:16 by Andria Meuse (NP)

## 2015-04-18 NOTE — Consult Note (Signed)
CC iron def anemia.Review of capsule study shows that there was dark material in distal ileum which may have been blood but no mucosal abnormality was seen.  Will get CT of abd and pelvis to see if a bleeding focus.  Electronic Signatures: Manya Silvas (MD)  (Signed on 09-Apr-14 15:19)  Authored  Last Updated: 09-Apr-14 15:19 by Manya Silvas (MD)

## 2015-04-18 NOTE — Discharge Summary (Signed)
PATIENT NAME:  Joyce Potter, Joyce Potter MR#:  [id number] DATE OF BIRTH:  28-Oct-1946  DATE OF ADMISSION:  05/03/2013 DATE OF DISCHARGE:  05/07/2013  PRIMARY CARE PHYSICIAN: [place] Pink, MD  DISPOSITION:  The patient actually will be discharged to [facility] for further care pending bed availability, on 05/07/2013.  CHIEF COMPLAINT: Weakness, drop in hemoglobin.   DISCHARGE DIAGNOSES: 1.  Acute on chronic anemia, likely hemorrhagic from gastrointestinal bleed.  Possibly small bowel arteriovenous malformation versus Dieulafoy.  2.  History of coronary artery disease status post bare-metal stenting middle of last year.  3.  Diabetes.  4.  Hypertension.  5.  Hyperlipidemia.  6.  Iron deficiency anemia.  7.  History of gastrointestinal bleed with multiple studies including EGDs, colonoscopies and capsule studies.  8.  History of coronary artery disease status post bare-metal stenting in 2000 as well as repeat stenting in June 2013 with bare metal with repeat cardiac catheterization in February 2014 without any intervention at that time.  9.  History of gastritis with H. pylori positivity status post treatment.  10.  Hyperlipidemia.  11.  History of benign thyroid nodule.  CURRENT MEDICATIONS:  1.  Losartan 100 mg daily. 2.  Metoprolol 25 mg b.i.d. 3.  Pantoprazole 40 mg daily. 4.  Pravastatin 40 mg at bedtime. 5.  Zantac 150 mg q. 12 hours. 6.  Carafate 1 gram orally before meals and at bedtime. 7.  Lantus 50 units q. 24 hours. 8.  Sliding scale insulin. 9.  Colace p.r.n.  10.  Senna p.r.n.   SIGNIFICANT LABS AND IMAGING: Labs on arrival:  Creatinine was 0.58, sodium 144, potassium 3.9, chloride 109 and anion gap of 7. LFTs on arrival within normal limits. Troponin negative. Hemoglobin on May 6th was 8.3, on May 8th it was 7.5.  Last hemoglobin of 9.4, as of May 12th.  Platelets on arrival was 304. Guaiac stool was positive. INR on arrival was 1. UA:  1+ leukocyte esterase, no nitrites, no bacteria,  7 WBC.  HISTORY OF PRESENT ILLNESS AND HOSPITAL COURSE:  For full details of H and P, please see the dictation by Joyce Potter on 05/03/2013, but briefly this is a pleasant [name] with history of CAD status post stenting, gastritis and iron deficiency anemia who sees Dr. Ma Hillock as an outpatient for IV iron as well as history of occult GI bleed with multiple studies and 6 transfusions recently who presented with weakness and was found to have a hemoglobin which had significantly dropped the several weeks preceding the arrival. The hemoglobin was in the mid 9s a few weeks ago and had gone down to mid 7s on the day of admission. Of note, she has had extensive follow-up and evaluations in this hospital as well as in [place]. Her last work-up here was in early April where she underwent an EGD as well as capsule endoscopy. The EGD and endoscopy, both of which occurred on April 9th by Joyce Potter, had showed gastritis and grade A reflux esophagitis with normal duodenum, and the capsule had shown dark material in the distal ileum, which may have been blunt, but no mucosal abnormality was seen.  She was discharged and then readmitted in [place] where capsule endoscopy again was done with a CAT scan in late April and capsule endoscopy was negative, per her. She had been getting IV iron at the [facility] for persistent iron deficiency anemia and came in for symptomatic anemia and was noted to have a drop in hemoglobin  again associated with dark stools. GI was consulted. She was seen by GI; however, given the extensive studies recently no work-up was planned here. I discussed the case with the patient's primary care physician, Joyce Potter, who has been attempting to get her in touch with [facility] for a second opinion and further evaluation. Thus far only possible source is the distal ileum and the patient is having ongoing melena and has a drop in hemoglobin every few weeks. She was transfused 1  unit of PRBC and the hemoglobin is up trending; however, the patient has had persistent melena and a guaiac stool here. Although she is not dizzy or short of breath at this point, she needs further evaluation for the ongoing occult GI bleed. At this point, the case was discussed with the [facility] and she has been accepted for transfer as she is deemed to be an unsafe discharge given no source has been found thus far and she has had multiple episodes of hospitalizations requiring PRBC transfusion and the fact that she has history of CAD and gets symptomatic more often than not with a drop in her hemoglobin. At this point, she will be transferred to [facility] pending bed availability for further care. Here she was seen by GI as well as hematologic again. She was instructed not to resume her aspirin at this point and follow with her cardiologist as an outpatient as well. The patient is FULL CODE. ____________________________ Vivien Presto, MD sa:sb D: 05/07/2013 13:18:54 ET T: 05/07/2013 13:43:31 ET JOB#: [id number]  cc: Vivien Presto, MD, <Dictator> Irven Easterly. Kary Kos, MD Vivien Presto MD ELECTRONICALLY SIGNED 05/21/2013 15:10

## 2015-04-18 NOTE — Consult Note (Signed)
PATIENT NAME:  Joyce Potter, Joyce Potter MR#:  [id number] DATE OF BIRTH:  [birth year]  DATE OF CONSULTATION:  04/03/2013  REFERRING PHYSICIAN:  Dr. Darvin Neighbours. CONSULTING PHYSICIAN:  Gibson Telleria R. Ma Hillock, MD  REASON FOR CONSULTATION:  Progressive anemia, ? need for intravenous iron therapy.   HISTORY OF PRESENT ILLNESS:  The patient is a 69 year old female with known history of persistent iron deficiency anemia who has been on parenteral iron therapy with Shirlean Kelly, recently in March 2014.  The patient has been admitted to hospital on April 7th with complaints of palpitations and dizziness along with melena.  She is being seen by GI and is being planned for an EGD.  Hemoglobin did drop to 7.8 and she is receiving 2 units of blood transfusion today.  Clinically states that she is beginning to feel better.  Currently denies any chest pain, dyspnea at rest, but has had intermittent palpitations which concerned her, also felt dizzy prior to admission.  She does have history of heart disease and follows with Dr. Ubaldo Glassing.   PAST MEDICAL HISTORY/PAST SURGICAL HISTORY: 1.  Iron deficiency anemia as above.  2.  Coronary artery disease status post PCI stent placement June 2013.  3.  Insulin-dependent diabetes mellitus.  4.  H. pylori positive mild gastritis.  5.  Hypertension.  6.  Hyperlipidemia.  7.  Benign thyroid nodule status post biopsy.  8.  Cataract.  9.  Hysterectomy.   FAMILY HISTORY:  Noncontributory.   SOCIAL HISTORY:  Denies smoking or alcohol usage.  Ambulates slowly for her age and medical problems.   ALLERGIES:  PENICILLIN AND SULFA.   HOME MEDICATIONS:  Omeprazole 20 mg twice daily, pravastatin 40 mg daily, Lantus insulin 65 units q. a.m. and 30 units at bedtime, aspirin 325 mg daily, losartan 100 mg daily, metoprolol tartrate 25 mg twice daily.   REVIEW OF SYSTEMS: CONSTITUTIONAL:  Feels weak and fatigued on exertion.  No fever or chills.  HEENT:  Denies headaches or dizziness at rest currently.   No epistaxis, ear or jaw pain.  CARDIAC:  As in history of present illness.  LUNGS:  Has dyspnea on exertion.  No new cough, sputum, or hemoptysis.  GASTROINTESTINAL:  No nausea, vomiting, or diarrhea.  Recent melena.  No new abdominal pain.  GENITOURINARY:  No dysuria or hematuria.  SKIN:  No new rashes or pruritus.  MUSCULOSKELETAL:  No new bone pains.  NEUROLOGIC:  Denies new focal weakness, seizures.  No new paresthesias in extremities.  ENDOCRINE:  No polyuria or polydipsia.   PHYSICAL EXAMINATION: GENERAL:  The patient is resting in bed, tired-looking, otherwise alert and oriented and no acute distress.  Converses appropriately.  Pallor present.  VITAL SIGNS:  98.2, 69, 20, 119/75, 96% on room air.  HEENT:  Normocephalic, atraumatic.  Extraocular movements intact.  Sclerae anicteric.  NECK:  Negative for lymphadenopathy.  CARDIOVASCULAR:  S1, S2, regular rate and rhythm, systolic murmur present.  LUNGS:  Show bilateral good air entry, no rhonchi.  ABDOMEN:  Soft, nontender.  No hepatomegaly clinically.  EXTREMITIES:  Shows mild edema.  SKIN:  No generalized rashes or bruising.  NEUROLOGIC:  Cranial nerves seem intact.  Moves all extremities spontaneously.   LABORATORY, DIAGNOSTIC, AND RADIOLOGICAL DATA:  Serum iron 26, iron saturation 9%, TIBC 626, folic acid 94.8, retic count 6.2%, hemoglobin 7.2, platelets 288, WBC 4200 with unremarkable differential.  Creatinine 0.78, calcium 8.0.  Urinalysis negative for blood.   ASSESSMENT AND PLAN:  A 69 year old female patient with history of multiple  medical problems as described above with persistent iron deficiency anemia status post parenteral iron therapy in March 2014 now admitted with symptoms of worsening anemia, likely secondary to ongoing gastrointestinal bleeding issues and continues to have evidence of iron deficiency.  Currently receiving blood transfusion support, agree with ongoing supportive treatment and transfusion as indicated  by lab and symptoms.  She is awaiting EGD by Dr. Vira Agar, she does have history of peptic ulcer disease in the past, states that she also has had colonoscopy x 2.  Regarding repeating course of parenteral iron therapy, we will await completion of packed red blood cell transfusions during this hospitalization, then follow-up and plan course of parenteral iron therapy accordingly.  The patient explained above, agreeable to this plan.   Thank you for the referral, please feel free to contact me if any additional questions.     ____________________________ Rhett Bannister Ma Hillock, MD srp:ea D: 04/04/2013 00:08:43 ET T: 04/04/2013 00:56:31 ET JOB#: [id number]  cc: Trevor Iha R. Ma Hillock, MD, <Dictator> Alveta Heimlich MD ELECTRONICALLY SIGNED 04/04/2013 11:12

## 2015-04-18 NOTE — Consult Note (Signed)
Pt CC: palpitations, dizzyness, acute anemia.  Pt known to me for similar presentation last year with essentially a neg work up. Due to symptomatic anemia she needs t be transfused and I contacted Dr. Posey Pronto.  Will repeat EGD tomorrow, see no need for repeat colon.  Electronic Signatures: Manya Silvas (MD)  (Signed on 08-Apr-14 07:17)  Authored  Last Updated: 08-Apr-14 07:17 by Manya Silvas (MD)

## 2015-04-30 ENCOUNTER — Inpatient Hospital Stay: Payer: Commercial Managed Care - HMO | Attending: Internal Medicine

## 2015-04-30 DIAGNOSIS — D509 Iron deficiency anemia, unspecified: Secondary | ICD-10-CM | POA: Diagnosis not present

## 2015-04-30 DIAGNOSIS — Z79899 Other long term (current) drug therapy: Secondary | ICD-10-CM | POA: Insufficient documentation

## 2015-04-30 LAB — IRON AND TIBC
Iron: 50 ug/dL (ref 28–170)
Saturation Ratios: 19 % (ref 10.4–31.8)
TIBC: 265 ug/dL (ref 250–450)
UIBC: 215 ug/dL

## 2015-04-30 LAB — FERRITIN: Ferritin: 73 ng/mL (ref 11–307)

## 2015-04-30 LAB — HEMOGLOBIN: Hemoglobin: 11.1 g/dL — ABNORMAL LOW (ref 12.0–16.0)

## 2015-05-02 ENCOUNTER — Telehealth: Payer: Self-pay | Admitting: *Deleted

## 2015-05-02 NOTE — Telephone Encounter (Signed)
Reviewed her hgb, and iron studies and they are good right now and no IV iron needed per dr Ma Hillock.  Pt stated that her heart was racing and each time it does it , it is either low iron or her heart so she will call her cardiologist.

## 2015-06-24 ENCOUNTER — Other Ambulatory Visit: Payer: Self-pay

## 2015-06-24 DIAGNOSIS — D649 Anemia, unspecified: Secondary | ICD-10-CM

## 2015-06-25 ENCOUNTER — Inpatient Hospital Stay: Payer: Commercial Managed Care - HMO | Attending: Internal Medicine

## 2015-06-25 ENCOUNTER — Inpatient Hospital Stay: Payer: Commercial Managed Care - HMO | Admitting: Internal Medicine

## 2015-07-14 ENCOUNTER — Inpatient Hospital Stay: Payer: Commercial Managed Care - HMO | Attending: Internal Medicine

## 2015-07-14 ENCOUNTER — Inpatient Hospital Stay: Payer: Commercial Managed Care - HMO | Admitting: Internal Medicine

## 2015-07-14 VITALS — BP 161/72 | HR 60 | Temp 97.3°F | Resp 18 | Ht 63.0 in | Wt 211.6 lb

## 2015-07-14 DIAGNOSIS — D649 Anemia, unspecified: Secondary | ICD-10-CM | POA: Diagnosis not present

## 2015-07-14 LAB — CBC
HCT: 32.2 % — ABNORMAL LOW (ref 35.0–47.0)
Hemoglobin: 10.4 g/dL — ABNORMAL LOW (ref 12.0–16.0)
MCH: 28 pg (ref 26.0–34.0)
MCHC: 32.3 g/dL (ref 32.0–36.0)
MCV: 86.5 fL (ref 80.0–100.0)
Platelets: 276 10*3/uL (ref 150–440)
RBC: 3.72 MIL/uL — ABNORMAL LOW (ref 3.80–5.20)
RDW: 13.6 % (ref 11.5–14.5)
WBC: 5.8 10*3/uL (ref 3.6–11.0)

## 2015-07-14 LAB — HEPATIC FUNCTION PANEL
ALK PHOS: 80 U/L (ref 38–126)
ALT: 25 U/L (ref 14–54)
AST: 25 U/L (ref 15–41)
Albumin: 4 g/dL (ref 3.5–5.0)
TOTAL PROTEIN: 7.5 g/dL (ref 6.5–8.1)
Total Bilirubin: 0.6 mg/dL (ref 0.3–1.2)

## 2015-07-14 LAB — IRON AND TIBC
Iron: 56 ug/dL (ref 28–170)
SATURATION RATIOS: 19 % (ref 10.4–31.8)
TIBC: 302 ug/dL (ref 250–450)
UIBC: 246 ug/dL

## 2015-07-14 LAB — CREATININE, SERUM
CREATININE: 0.77 mg/dL (ref 0.44–1.00)
GFR calc non Af Amer: >60 mL/min (ref >60)

## 2015-07-14 LAB — SAMPLE TO BLOOD BANK

## 2015-07-14 NOTE — Progress Notes (Signed)
   07/14/15 1426  Clinical Encounter Type  Visited With Patient and family together  Visit Type Initial  Advance Directives (For Healthcare)  Does patient have an advance directive? No  Would patient like information on creating an advanced directive? No - patient declined information  Visited with patient and her spouse in cancer center.  Provided pastoral support and presence.  Zinc [phone]

## 2015-07-14 NOTE — Progress Notes (Signed)
Patient states that her energy level has been low. She also states that her appetite has just been "so-so".

## 2015-08-08 ENCOUNTER — Encounter: Payer: Self-pay | Admitting: *Deleted

## 2015-08-08 ENCOUNTER — Ambulatory Visit
Admission: RE | Admit: 2015-08-08 | Discharge: 2015-08-08 | Disposition: A | Discharge status: Home / Self Care | Payer: Commercial Managed Care - HMO | Source: Ambulatory Visit | Attending: Cardiology | Admitting: Cardiology

## 2015-08-08 ENCOUNTER — Encounter
Admission: RE | Disposition: A | Discharge status: Home / Self Care | Payer: Self-pay | Source: Ambulatory Visit | Attending: Cardiology

## 2015-08-08 DIAGNOSIS — Z9862 Peripheral vascular angioplasty status: Secondary | ICD-10-CM | POA: Diagnosis not present

## 2015-08-08 DIAGNOSIS — E049 Nontoxic goiter, unspecified: Secondary | ICD-10-CM | POA: Insufficient documentation

## 2015-08-08 DIAGNOSIS — E119 Type 2 diabetes mellitus without complications: Secondary | ICD-10-CM | POA: Insufficient documentation

## 2015-08-08 DIAGNOSIS — Z85828 Personal history of other malignant neoplasm of skin: Secondary | ICD-10-CM | POA: Diagnosis not present

## 2015-08-08 DIAGNOSIS — I2510 Atherosclerotic heart disease of native coronary artery without angina pectoris: Secondary | ICD-10-CM | POA: Diagnosis not present

## 2015-08-08 DIAGNOSIS — I10 Essential (primary) hypertension: Secondary | ICD-10-CM | POA: Insufficient documentation

## 2015-08-08 DIAGNOSIS — Z8711 Personal history of peptic ulcer disease: Secondary | ICD-10-CM | POA: Insufficient documentation

## 2015-08-08 DIAGNOSIS — I350 Nonrheumatic aortic (valve) stenosis: Secondary | ICD-10-CM | POA: Insufficient documentation

## 2015-08-08 DIAGNOSIS — Z9889 Other specified postprocedural states: Secondary | ICD-10-CM | POA: Diagnosis not present

## 2015-08-08 DIAGNOSIS — Z794 Long term (current) use of insulin: Secondary | ICD-10-CM | POA: Insufficient documentation

## 2015-08-08 DIAGNOSIS — Z9841 Cataract extraction status, right eye: Secondary | ICD-10-CM | POA: Diagnosis not present

## 2015-08-08 DIAGNOSIS — Z79899 Other long term (current) drug therapy: Secondary | ICD-10-CM | POA: Diagnosis not present

## 2015-08-08 DIAGNOSIS — Z9842 Cataract extraction status, left eye: Secondary | ICD-10-CM | POA: Insufficient documentation

## 2015-08-08 DIAGNOSIS — Z90710 Acquired absence of both cervix and uterus: Secondary | ICD-10-CM | POA: Diagnosis not present

## 2015-08-08 DIAGNOSIS — E785 Hyperlipidemia, unspecified: Secondary | ICD-10-CM | POA: Diagnosis not present

## 2015-08-08 HISTORY — DX: Unspecified cataract: H26.9

## 2015-08-08 HISTORY — DX: Nonrheumatic aortic (valve) stenosis: I35.0

## 2015-08-08 HISTORY — DX: Hyperlipidemia, unspecified: E78.5

## 2015-08-08 HISTORY — DX: Peptic ulcer, site unspecified, unspecified as acute or chronic, without hemorrhage or perforation: K27.9

## 2015-08-08 HISTORY — DX: Anemia, unspecified: D64.9

## 2015-08-08 HISTORY — PX: CARDIAC CATHETERIZATION: SHX172

## 2015-08-08 HISTORY — DX: Basal cell carcinoma of skin, unspecified: C44.91

## 2015-08-08 SURGERY — RIGHT AND LEFT HEART CATH
Anesthesia: Moderate Sedation

## 2015-08-08 MED ORDER — MIDAZOLAM HCL 2 MG/2ML IJ SOLN
INTRAMUSCULAR | Status: DC | PRN
  Administered 2015-08-08: 0.5 mg via INTRAVENOUS
  Administered 2015-08-08: 1 mg via INTRAVENOUS
  Administered 2015-08-08: 0.5 mg via INTRAVENOUS

## 2015-08-08 MED ORDER — FENTANYL CITRATE (PF) 100 MCG/2ML IJ SOLN
INTRAMUSCULAR | Status: DC | PRN
  Administered 2015-08-08: 50 ug via INTRAVENOUS
  Administered 2015-08-08 (×2): 25 ug via INTRAVENOUS

## 2015-08-08 MED ORDER — MIDAZOLAM HCL 2 MG/2ML IJ SOLN
INTRAMUSCULAR | Status: AC
  Filled 2015-08-08: qty 2

## 2015-08-08 MED ORDER — HEPARIN (PORCINE) IN NACL 2-0.9 UNIT/ML-% IJ SOLN
INTRAMUSCULAR | Status: AC
  Filled 2015-08-08: qty 1000

## 2015-08-08 MED ORDER — FENTANYL CITRATE (PF) 100 MCG/2ML IJ SOLN
INTRAMUSCULAR | Status: AC
  Filled 2015-08-08: qty 2

## 2015-08-08 MED ORDER — SODIUM CHLORIDE 0.9 % IV SOLN
INTRAVENOUS | Status: DC
  Administered 2015-08-08: 07:00:00 via INTRAVENOUS

## 2015-08-08 MED ORDER — IOHEXOL 300 MG/ML  SOLN
INTRAMUSCULAR | Status: DC | PRN
  Administered 2015-08-08: 90 mL via INTRA_ARTERIAL

## 2015-08-08 SURGICAL SUPPLY — 15 items
CATH INFINITI 5FR ANG PIGTAIL (CATHETERS) ×2 IMPLANT
CATH INFINITI 5FR JL4 (CATHETERS) ×2 IMPLANT
CATH INFINITI JR4 5F (CATHETERS) ×2 IMPLANT
CATH SWANZ 7F THERMO (CATHETERS) ×2 IMPLANT
DEVICE CLOSURE MYNXGRIP 5F (Vascular Products) IMPLANT
DEVICE CLOSURE MYNXGRIP 6/7F (Vascular Products) ×2 IMPLANT
GUIDEWIRE EMER 3M J .025X150CM (WIRE) ×2 IMPLANT
KIT MANI 3VAL PERCEP (MISCELLANEOUS) ×2 IMPLANT
KIT RIGHT HEART (MISCELLANEOUS) ×2 IMPLANT
NEEDLE PERC 18GX7CM (NEEDLE) ×2 IMPLANT
PACK CARDIAC CATH (CUSTOM PROCEDURE TRAY) ×2 IMPLANT
SHEATH AVANTI 5FR X 11CM (SHEATH) IMPLANT
SHEATH AVANTI 6FR X 11CM (SHEATH) ×2 IMPLANT
SHEATH PINNACLE 7F 10CM (SHEATH) ×2 IMPLANT
WIRE EMERALD 3MM-J .035X150CM (WIRE) ×4 IMPLANT

## 2015-08-08 NOTE — H&P (Signed)
Chief Complaint: Chief Complaint  Patient presents with  . elevated heart rate  is get up to 130's but resting heart rate in 113.  . Back Pain  . Headache  Date of Service: 08/04/2015 Date of Birth: 1946-01-31 PCP: Macie Burows, MD  History of Present Illness: [name] is a 69 y.o.female patient who has a history of aortic stenosis which is mild-to-moderate with an aortic valve area of 1.1 cm2 with a peak gradient of 27.5 mm and a mean gradient 13.2 mm by recent echo. She presents as a walk-in visit with multiple complaints including headache abdominal pain back pain leg pain rapid heart rate shortness of breath chest pain. EKG reveals sinus tachycardia at a rate of 115 with a PR interval 154 milliseconds, QRS duration 64 milliseconds with a QTC of 434 milliseconds a QRS axis of 43. There is no ischemia. She states she is compliant with her medications including metoprolol tartrate 50 mg twice daily. She recently had a functional study done where she exercised 3 minutes and 16 seconds complaining of shortness of breath and headache with no ischemia ejection fraction 65%. Past Medical and Surgical History  Past Medical History Past Medical History  Diagnosis Date  . DM2 (diabetes mellitus, type 2)  . HTN (hypertension)  . Cataract cortical, senile  . PUD (peptic ulcer disease)  History of a bleeding ulcer  . CAD (coronary artery disease)  3.0by 18 mm vision stent in the mid circumflex  . Aortic stenosis, moderate  . Hyperlipidemia  . Palpitations  . Basal cell carcinoma  . Iron deficiency anemia  a. hospitalization and 2U PRBC transfusion 10/2012  . Encounter for blood transfusion  . Multinodular goiter  a. biopsy of nodule 06/2012 was benign   Past Surgical History She has past surgical history that includes capsule endoscopy (N/A, 05/09/2013); esophagogastrodoudenoscopy w/biopsy (05/09/2013); Abdominal hysterectomy; Breast excisional biopsy; Cataract extraction (Bilateral);  Incision Tendon Sheath For Trigger Finger (Bilateral); Coronary angioplasty; Stress test; Echocardiogram; colonoscopy (12/04/2012); egd (04/04/2013); Basal cell removed; and Trigger finger bilateral thumb.   Medications and Allergies  Current Medications  Current Outpatient Prescriptions  Medication Sig Dispense Refill  . ascorbic acid (VITAMIN C) 500 MG tablet Take 500 mg by mouth once daily.  Marland Kitchen BIOTIN ORAL Take 1 tablet by mouth once daily.  . blood glucose diagnostic test strip Use 3 (three) times daily. Use as instructed. 300 each 2  . blood glucose meter kit Use as directed. 1 each 0  . FLAXSEED OIL ORAL Take 1 capsule by mouth once daily.  Marland Kitchen GARLIC ORAL Take by mouth daily.  . insulin NPH-REGULAR (HUMULIN, NOVOLIN 70/30) 100 unit/mL (70-30) injection Inject subcutaneously 2 (two) times daily before meals. 48 in am and 28 in pm  . insulin syringe-needle U-100 1/2 mL 30 x 1/2" syringe Use 2 (two) times daily. 200 each 3  . losartan-hydrochlorothiazide (HYZAAR) 100-25 mg tablet Take 1 tablet by mouth once daily. 90 tablet 3  . metoprolol tartrate (LOPRESSOR) 50 MG tablet Take 1 tablet (50 mg total) by mouth 2 (two) times daily. 180 tablet 3  . multivitamin with iron/minerals (THERA-M,THERADEX-M) 27-0.4 mg tablet Take 1 tablet by mouth daily. 30 tablet 0  . omeprazole (PRILOSEC) 40 MG DR capsule Take 1 capsule (40 mg total) by mouth once daily. 90 capsule 3  . pen needle, diabetic 31 gauge x 5/16" needle Use 2 (two) times daily. 200 each 1   No current facility-administered medications for this visit.   Allergies:  Invokana; Lovastatin; Penicillins; Sulfa (sulfonamide antibiotics); and Vicodin  Social and Family History  Social History reports that she quit smoking about 30 years ago. She has never used smokeless tobacco. She reports that she does not drink alcohol or use illicit drugs.  Family History Family History  Problem Relation Age of Onset  . Coronary artery disease Mother   . Diabetes type II Mother  . Hypertension Mother  . Osteoporosis Mother  . Breast cancer Mother  . Coronary artery disease Father  . Hypertension Father  . Osteoporosis Father  . Stroke Father  . Diabetes type II Father  . Diabetes type II Sister  . Hypertension Brother   Review of Systems  Review of Systems  Constitutional: Negative for fever, chills, weight loss, malaise/fatigue and diaphoresis.  HENT: Negative for congestion, ear discharge and tinnitus.  Eyes: Negative for blurred vision.  Respiratory: Positive for shortness of breath. Negative for cough, hemoptysis, sputum production and wheezing.  Cardiovascular: Positive for chest pain and palpitations. Negative for orthopnea, claudication, leg swelling and PND.  Gastrointestinal: Positive for heartburn, nausea and abdominal pain. Negative for vomiting, diarrhea, constipation, blood in stool and melena.  Genitourinary: Negative for dysuria, urgency, frequency and hematuria.  Musculoskeletal: Positive for back pain. Negative for myalgias, joint pain and falls.  Skin: Negative for itching and rash.  Neurological: Positive for headaches. Negative for dizziness, tingling, focal weakness, loss of consciousness and weakness.  Endo/Heme/Allergies: Negative for polydipsia. Does not bruise/bleed easily.  Psychiatric/Behavioral: Negative for depression, memory loss and substance abuse. The patient is not nervous/anxious.    Physical Examination   Vitals:BP 140/98 mmHg  Pulse 128  Resp 12  Ht 162.6 cm (_0 )  Wt 93.441 kg (206 lb)  BMI 35.34 kg/m2 Ht:162.6 cm (_1 ) Wt:93.441 kg (206 lb) EEF:EOFH surface area is 2.05 meters squared. Body mass index is 35.34 kg/(m^2).  Wt Readings from Last 3 Encounters:  08/04/15 93.441 kg (206 lb)  07/23/15 93.804 kg (206 lb 12.8 oz)  06/25/15 93.441 kg (206 lb)   BP Readings from Last 3 Encounters:  08/04/15 140/98  07/23/15 142/80  06/25/15 130/70  general: Female in no acute  distress  LUNGS Breath Sounds: Normal Percussion: Normal  CARDIOVASCULAR JVP CV wave: no HJR: no Elevation at 90 degrees: None Carotid Pulse: normal pulsation bilaterally Bruit: None Apex: apical impulse normal  Auscultation Rhythm: sinus tachycardia, rate=115 S1: normal S2: normal Clicks: no Rub: no Murmurs: 3/6 medium pitched crescendo-decrescendo harsh at lower left sternal border, at upper left sternal border, at upper right sternal border  Gallop: None ABDOMEN Liver enlargement: no Pulsatile aorta: no Ascites: no Bruits: no  EXTREMITIES Clubbing: no Edema: trace to 1+ bilateral pedal edema Pulses: peripheral pulses symmetrical Femoral Bruits: no Amputation: no SKIN Rash: no Cyanosis: no Embolic phemonenon: no Bruising: no NEURO Alert and Oriented to person, place and time: yes Non focal: yes LABS Last 3 CBC results: Lab Results  Component Value Date  WBC 5.1 01/14/2015  WBC 4.2 05/11/2013  WBC 4.1 05/10/2013   Lab Results  Component Value Date  HGB 11.7* 01/14/2015  HGB 10.6* 05/11/2013  HGB 8.6* 05/10/2013   Lab Results  Component Value Date  HCT 35.6 01/14/2015  HCT 0.32* 05/11/2013  HCT 0.26* 05/10/2013   Lab Results  Component Value Date  PLT 226 01/14/2015  PLT 236 05/11/2013  PLT 253 05/10/2013   Lab Results  Component Value Date  CREATININE 0.9 05/28/2015  BUN 19 05/28/2015  NA 139 05/28/2015  K 4.4 05/28/2015  CL 100 05/28/2015  CO2 31.8 05/28/2015   Lab Results  Component Value Date  HGBA1C 8.2* 05/28/2015   Lab Results  Component Value Date  ALT 26 05/30/2015  AST 27 05/30/2015  ALKPHOS 74 05/30/2015     Assessment and Plan   69 y.o. female with  ICD-10-CM ICD-9-CM  1. Coronary artery disease involving native coronary artery of native heart-the she may have angina as the the cause of her symptoms. Functional study did not show ischemia but the patient continues to have exertional chest pain. Will need to  proceed with a right left heart catheterization to evaluate coronary anatomy as well as her aortic stenosis to guide further therapy. I25.10 414.01  2. Pure hypercholesterolemia-Will continue with low-fat diet. LDL goal of less than 100 is recommended. E78.0 272.0  3. Essential hypertension-blood pressure is controlled with metoprolol and losartan. Will increase metoprolol to 150 mg twice daily I10 401.9  4. Type 2 diabetes mellitus with other circulatory complications-patient will remain on insulin with an hemoglobin A1c goal of less than 6. Most recent value was 8.2. Strict adherence to ADA diet is recommended E11.59 250.70  5. Palpitations-continue with metoprolol and avoiding stimulant R00.2 785.1  6. Aortic stenosis, moderate-does not appear critical at present. However patient has symptoms of progressed. Will proceed with a right left heart catheterization to evaluate coronary anatomy as well as aortic valve. I35.0 424.1   Return in about 2 weeks (around 08/18/2015).  These notes generated with voice recognition software. I apologize for typographical errors.  Sydnee Levans, MD

## 2015-08-08 NOTE — Discharge Instructions (Signed)

## 2015-09-28 ENCOUNTER — Inpatient Hospital Stay
Admission: EM | Admit: 2015-09-28 | Discharge: 2015-10-02 | DRG: 039 | Disposition: A | Discharge status: Home / Self Care | Payer: Commercial Managed Care - HMO | Attending: Internal Medicine | Admitting: Internal Medicine

## 2015-09-28 ENCOUNTER — Observation Stay: Payer: Commercial Managed Care - HMO

## 2015-09-28 ENCOUNTER — Encounter: Payer: Self-pay | Admitting: Internal Medicine

## 2015-09-28 ENCOUNTER — Emergency Department: Payer: Commercial Managed Care - HMO

## 2015-09-28 ENCOUNTER — Emergency Department
Admission: EM | Admit: 2015-09-28 | Discharge: 2015-09-28 | Disposition: A | Discharge status: Home / Self Care | Payer: Commercial Managed Care - HMO | Source: Home / Self Care | Attending: Emergency Medicine | Admitting: Emergency Medicine

## 2015-09-28 ENCOUNTER — Encounter: Payer: Self-pay | Admitting: Emergency Medicine

## 2015-09-28 DIAGNOSIS — I639 Cerebral infarction, unspecified: Secondary | ICD-10-CM | POA: Diagnosis present

## 2015-09-28 DIAGNOSIS — I10 Essential (primary) hypertension: Secondary | ICD-10-CM | POA: Diagnosis present

## 2015-09-28 DIAGNOSIS — H3412 Central retinal artery occlusion, left eye: Secondary | ICD-10-CM

## 2015-09-28 DIAGNOSIS — Z8673 Personal history of transient ischemic attack (TIA), and cerebral infarction without residual deficits: Secondary | ICD-10-CM

## 2015-09-28 DIAGNOSIS — Z7982 Long term (current) use of aspirin: Secondary | ICD-10-CM

## 2015-09-28 DIAGNOSIS — H5462 Unqualified visual loss, left eye, normal vision right eye: Secondary | ICD-10-CM | POA: Diagnosis not present

## 2015-09-28 DIAGNOSIS — Z886 Allergy status to analgesic agent status: Secondary | ICD-10-CM

## 2015-09-28 DIAGNOSIS — I6529 Occlusion and stenosis of unspecified carotid artery: Secondary | ICD-10-CM | POA: Diagnosis present

## 2015-09-28 DIAGNOSIS — K279 Peptic ulcer, site unspecified, unspecified as acute or chronic, without hemorrhage or perforation: Secondary | ICD-10-CM | POA: Diagnosis present

## 2015-09-28 DIAGNOSIS — Z794 Long term (current) use of insulin: Secondary | ICD-10-CM

## 2015-09-28 DIAGNOSIS — E7800 Pure hypercholesterolemia, unspecified: Secondary | ICD-10-CM | POA: Diagnosis present

## 2015-09-28 DIAGNOSIS — E119 Type 2 diabetes mellitus without complications: Secondary | ICD-10-CM | POA: Diagnosis present

## 2015-09-28 DIAGNOSIS — Z833 Family history of diabetes mellitus: Secondary | ICD-10-CM

## 2015-09-28 DIAGNOSIS — Z955 Presence of coronary angioplasty implant and graft: Secondary | ICD-10-CM

## 2015-09-28 DIAGNOSIS — E041 Nontoxic single thyroid nodule: Secondary | ICD-10-CM | POA: Diagnosis present

## 2015-09-28 DIAGNOSIS — I63132 Cerebral infarction due to embolism of left carotid artery: Secondary | ICD-10-CM | POA: Diagnosis not present

## 2015-09-28 DIAGNOSIS — D649 Anemia, unspecified: Secondary | ICD-10-CM | POA: Diagnosis present

## 2015-09-28 DIAGNOSIS — Z882 Allergy status to sulfonamides status: Secondary | ICD-10-CM

## 2015-09-28 DIAGNOSIS — Z823 Family history of stroke: Secondary | ICD-10-CM

## 2015-09-28 DIAGNOSIS — Z803 Family history of malignant neoplasm of breast: Secondary | ICD-10-CM

## 2015-09-28 DIAGNOSIS — I2510 Atherosclerotic heart disease of native coronary artery without angina pectoris: Secondary | ICD-10-CM | POA: Diagnosis present

## 2015-09-28 DIAGNOSIS — Z85828 Personal history of other malignant neoplasm of skin: Secondary | ICD-10-CM

## 2015-09-28 DIAGNOSIS — H3410 Central retinal artery occlusion, unspecified eye: Secondary | ICD-10-CM | POA: Diagnosis present

## 2015-09-28 DIAGNOSIS — Z880 Allergy status to penicillin: Secondary | ICD-10-CM

## 2015-09-28 DIAGNOSIS — E785 Hyperlipidemia, unspecified: Secondary | ICD-10-CM | POA: Diagnosis present

## 2015-09-28 DIAGNOSIS — I350 Nonrheumatic aortic (valve) stenosis: Secondary | ICD-10-CM | POA: Diagnosis present

## 2015-09-28 DIAGNOSIS — Z825 Family history of asthma and other chronic lower respiratory diseases: Secondary | ICD-10-CM

## 2015-09-28 DIAGNOSIS — Z87891 Personal history of nicotine dependence: Secondary | ICD-10-CM

## 2015-09-28 DIAGNOSIS — Z8049 Family history of malignant neoplasm of other genital organs: Secondary | ICD-10-CM

## 2015-09-28 DIAGNOSIS — I6522 Occlusion and stenosis of left carotid artery: Principal | ICD-10-CM | POA: Diagnosis present

## 2015-09-28 DIAGNOSIS — H3322 Serous retinal detachment, left eye: Secondary | ICD-10-CM

## 2015-09-28 DIAGNOSIS — Z7902 Long term (current) use of antithrombotics/antiplatelets: Secondary | ICD-10-CM

## 2015-09-28 DIAGNOSIS — H269 Unspecified cataract: Secondary | ICD-10-CM | POA: Diagnosis present

## 2015-09-28 LAB — BASIC METABOLIC PANEL
Anion gap: 4 — ABNORMAL LOW (ref 5–15)
BUN: 15 mg/dL (ref 6–20)
CHLORIDE: 107 mmol/L (ref 101–111)
CO2: 29 mmol/L (ref 22–32)
CREATININE: 0.76 mg/dL (ref 0.44–1.00)
Calcium: 8.9 mg/dL (ref 8.9–10.3)
Glucose, Bld: 87 mg/dL (ref 65–99)
Potassium: 3.9 mmol/L (ref 3.5–5.1)
SODIUM: 140 mmol/L (ref 135–145)

## 2015-09-28 LAB — CBC
HCT: 32.5 % — ABNORMAL LOW (ref 35.0–47.0)
Hemoglobin: 10.4 g/dL — ABNORMAL LOW (ref 12.0–16.0)
MCH: 26.7 pg (ref 26.0–34.0)
MCHC: 32 g/dL (ref 32.0–36.0)
MCV: 83.7 fL (ref 80.0–100.0)
PLATELETS: 287 10*3/uL (ref 150–440)
RBC: 3.89 MIL/uL (ref 3.80–5.20)
RDW: 14.3 % (ref 11.5–14.5)
WBC: 5.3 10*3/uL (ref 3.6–11.0)

## 2015-09-28 LAB — TROPONIN I: Troponin I: <0.03 ng/mL (ref <0.031)

## 2015-09-28 LAB — GLUCOSE, CAPILLARY
GLUCOSE-CAPILLARY: 175 mg/dL — AB (ref 65–99)
Glucose-Capillary: 210 mg/dL — ABNORMAL HIGH (ref 65–99)

## 2015-09-28 MED ORDER — INSULIN ASPART PROT & ASPART (70-30 MIX) 100 UNIT/ML ~~LOC~~ SUSP
28.0000 [IU] | Freq: Every day | SUBCUTANEOUS | Status: DC
  Administered 2015-09-28 – 2015-09-29 (×2): 28 [IU] via SUBCUTANEOUS
  Filled 2015-09-28 (×2): qty 28

## 2015-09-28 MED ORDER — INSULIN ASPART PROT & ASPART (70-30 MIX) 100 UNIT/ML ~~LOC~~ SUSP
48.0000 [IU] | Freq: Every day | SUBCUTANEOUS | Status: DC
  Administered 2015-09-29 – 2015-09-30 (×2): 48 [IU] via SUBCUTANEOUS
  Filled 2015-09-28 (×2): qty 48

## 2015-09-28 MED ORDER — PANTOPRAZOLE SODIUM 40 MG PO TBEC: 40.0000 mg | DELAYED_RELEASE_TABLET | Freq: Every day | ORAL | Status: DC

## 2015-09-28 MED ORDER — INFLUENZA VAC SPLIT QUAD 0.5 ML IM SUSY
0.5000 mL | PREFILLED_SYRINGE | INTRAMUSCULAR | Status: AC
  Administered 2015-09-29: 0.5 mL via INTRAMUSCULAR
  Filled 2015-09-28: qty 0.5

## 2015-09-28 MED ORDER — FLAXSEED OIL 1000 MG PO CAPS: 1000.0000 mg | ORAL_CAPSULE | Freq: Every day | ORAL | Status: DC

## 2015-09-28 MED ORDER — GARLIC 3 MG PO CAPS: ORAL_CAPSULE | Freq: Every day | ORAL | Status: DC

## 2015-09-28 MED ORDER — INSULIN ASPART 100 UNIT/ML ~~LOC~~ SOLN
0.0000 [IU] | Freq: Every day | SUBCUTANEOUS | Status: DC
  Filled 2015-09-28: qty 1

## 2015-09-28 MED ORDER — SODIUM CHLORIDE 0.9 % IJ SOLN
3.0000 mL | Freq: Two times a day (BID) | INTRAMUSCULAR | Status: DC
  Administered 2015-09-28 – 2015-09-29 (×3): 3 mL via INTRAVENOUS

## 2015-09-28 MED ORDER — ACETAMINOPHEN 325 MG PO TABS: 650.0000 mg | ORAL_TABLET | Freq: Four times a day (QID) | ORAL | Status: DC | PRN

## 2015-09-28 MED ORDER — ACETAMINOPHEN 650 MG RE SUPP: 650.0000 mg | Freq: Four times a day (QID) | RECTAL | Status: DC | PRN

## 2015-09-28 MED ORDER — PRAVASTATIN SODIUM 20 MG PO TABS
10.0000 mg | ORAL_TABLET | Freq: Every day | ORAL | Status: DC
  Administered 2015-09-28 – 2015-10-01 (×4): 10 mg via ORAL
  Filled 2015-09-28 (×6): qty 1

## 2015-09-28 MED ORDER — ASPIRIN-DIPYRIDAMOLE ER 25-200 MG PO CP12
1.0000 | ORAL_CAPSULE | Freq: Two times a day (BID) | ORAL | Status: DC
  Administered 2015-09-28: 1 via ORAL
  Filled 2015-09-28 (×2): qty 1

## 2015-09-28 MED ORDER — ADULT MULTIVITAMIN W/MINERALS CH
1.0000 | ORAL_TABLET | Freq: Every day | ORAL | Status: DC
  Administered 2015-09-29 – 2015-10-02 (×4): 1 via ORAL
  Filled 2015-09-28 (×4): qty 1

## 2015-09-28 MED ORDER — FERROUS SULFATE 325 (65 FE) MG PO TABS
325.0000 mg | ORAL_TABLET | Freq: Every day | ORAL | Status: DC
  Administered 2015-09-29 – 2015-10-02 (×4): 325 mg via ORAL
  Filled 2015-09-28 (×4): qty 1

## 2015-09-28 MED ORDER — PANTOPRAZOLE SODIUM 40 MG PO TBEC
40.0000 mg | DELAYED_RELEASE_TABLET | Freq: Every day | ORAL | Status: DC
  Administered 2015-09-29 – 2015-10-02 (×4): 40 mg via ORAL
  Filled 2015-09-28 (×4): qty 1

## 2015-09-28 MED ORDER — METOPROLOL TARTRATE 50 MG PO TABS
100.0000 mg | ORAL_TABLET | Freq: Two times a day (BID) | ORAL | Status: DC
  Administered 2015-09-28 – 2015-10-01 (×5): 100 mg via ORAL
  Filled 2015-09-28 (×5): qty 2

## 2015-09-28 MED ORDER — VITAMIN C 500 MG PO TABS
500.0000 mg | ORAL_TABLET | Freq: Every day | ORAL | Status: DC
  Administered 2015-09-29 – 2015-10-02 (×4): 500 mg via ORAL
  Filled 2015-09-28 (×3): qty 1

## 2015-09-28 MED ORDER — INSULIN ASPART 100 UNIT/ML ~~LOC~~ SOLN
0.0000 [IU] | Freq: Three times a day (TID) | SUBCUTANEOUS | Status: DC
  Administered 2015-09-28: 2 [IU] via SUBCUTANEOUS
  Administered 2015-09-29: 14:00:00 3 [IU] via SUBCUTANEOUS
  Administered 2015-09-29: 08:00:00 1 [IU] via SUBCUTANEOUS
  Administered 2015-10-01: 2 [IU] via SUBCUTANEOUS
  Filled 2015-09-28: qty 5
  Filled 2015-09-28 (×3): qty 2
  Filled 2015-09-28: qty 1
  Filled 2015-09-28: qty 2

## 2015-09-28 MED ORDER — BIOTIN 1000 MCG PO TABS: 1000.0000 ug | ORAL_TABLET | Freq: Every day | ORAL | Status: DC

## 2015-09-28 NOTE — ED Notes (Signed)
Pt was seen at eye dr on Fri for left eye pressure/pain, Dr stated she had a few small bleeds in her left eye. Around midnight, pt states her head began hurting, and her visual field in her left eye decreased. Pt appears anxious, c/o headache and left sided face pain at this tim. Speech clear, denies any limb weakness or decrease in movement.

## 2015-09-28 NOTE — Progress Notes (Signed)
PHARMACIST - PHYSICIAN ORDER COMMUNICATION  CONCERNING: P&T Medication Policy on Herbal Medications  DESCRIPTION:  This patient's order for:  BIOTIN, GARLIC, AND FLAXSEED OIL  has been noted.  This product(s) is classified as an "herbal" or natural product. Due to a lack of definitive safety studies or FDA approval, nonstandard manufacturing practices, plus the potential risk of unknown drug-drug interactions while on inpatient medications, the Pharmacy and Therapeutics Committee does not permit the use of "herbal" or natural products of this type within [facility].   ACTION TAKEN: The pharmacy department is unable to verify this order at this time and your patient has been informed of this safety policy. Please reevaluate patient's clinical condition at discharge and address if the herbal or natural product(s) should be resumed at that time.  Paulina Fusi, PharmD, BCPS 09/28/2015 6:19 PM

## 2015-09-28 NOTE — H&P (Signed)
[facility] NAME: Joyce Potter    MR#:  [id number]  DATE OF BIRTH:  June 13, 1946  DATE OF ADMISSION:  09/28/2015  PRIMARY CARE PHYSICIAN: [place] Pink, MD   REQUESTING/REFERRING PHYSICIAN: Harvest Dark  CHIEF COMPLAINT:  I can't see well out of my left eye  HISTORY OF PRESENT ILLNESS:  Joyce Potter  is a 69 y.o. female with a known history of diabetes, peptic ulcer disease, aortic stenosis, coronary artery disease. She presents to the ER with loss of vision on her left eye starting last night at 12 midnight. Her entire left lower field centrally is distorted. She presented to the ER and had a CT scan that was negative. The ER physician called the [facility] eye doctor on-call and she was seen in the office and there was no retinal detachment. It was felt to be a central artery retinal occlusion and she was sent back to the ER for further workup.  PAST MEDICAL HISTORY:   Past Medical History  Diagnosis Date  . IDDM (insulin dependent diabetes mellitus) ([place])   . High cholesterol   . HTN (hypertension)   . Multiple thyroid nodules     Benign  . CAD (coronary artery disease)   . Cataract   . Peptic ulcer disease   . Aortic stenosis   . Hyperlipidemia   . Basal cell carcinoma   . Anemia     PAST SURGICAL HISTORY:   Past Surgical History  Procedure Laterality Date  . Coronary stent placement    . Partial hysterectomy    . Givens capsule study N/A 04/17/2013    Procedure: GIVENS CAPSULE STUDY;  Surgeon: Arta Silence, MD;  Location: [facility] ENDOSCOPY;  Service: Endoscopy;  Laterality: N/A;  patient ate breakfast at 7am   . Breast biopsy    . Coronary angioplasty    . Eye surgery    . Trigger finger release    . Cardiac catheterization N/A 08/08/2015    Procedure: Right and Left Heart Cath and Coronary Angiography;  Surgeon: Teodoro Spray, MD;  Location: [facility] CV LAB;  Service: Cardiovascular;   Laterality: N/A;    SOCIAL HISTORY:   Social History  Substance Use Topics  . Smoking status: Former Research scientist (life sciences)  . Smokeless tobacco: Not on file  . Alcohol Use: No    FAMILY HISTORY:   Family History  Problem Relation Age of Onset  . Uterine cancer Mother   . Breast cancer Mother   . Colon cancer Neg Hx   . Seizures Father   . Stroke Father   . Diabetes Father   . COPD Father     DRUG ALLERGIES:   Allergies  Allergen Reactions  . Aspirin Other (See Comments)    ulcers  . Invokamet [Canagliflozin-Metformin Hcl] Other (See Comments)    Yeast infection  . Sulfa Antibiotics Other (See Comments)    "Got Drunk"  . Lovastatin Rash  . Penicillins Rash  . Vicodin [Hydrocodone-Acetaminophen] Rash    REVIEW OF SYSTEMS:  CONSTITUTIONAL: No fever, positive for fatigue. Positive for sweats EYES: Left eye vision loss lower central field. She wears reading glasses EARS, NOSE, AND THROAT: No tinnitus or ear pain. No sore throat. Decreased hearing. Positive for runny nose. Occasional dysphagia. RESPIRATORY: Positive for dry cough, positive for shortness of breath, no wheezing or hemoptysis.  CARDIOVASCULAR: No chest pain, orthopnea, edema.  GASTROINTESTINAL: No nausea, vomiting, diarrhea or abdominal pain. Occasional blood in bowel movement  with hemorrhoid GENITOURINARY: No dysuria, hematuria.  ENDOCRINE: No polyuria, nocturia,  HEMATOLOGY: History of anemia SKIN: No rash or lesion. MUSCULOSKELETAL: Positive for arthritis.   NEUROLOGIC: No tingling, numbness, weakness. Left eye vision loss PSYCHIATRY: No anxiety or depression.   MEDICATIONS AT HOME:   Prior to Admission medications   Medication Sig Start Date End Date Taking? Authorizing Provider  ascorbic acid (VITAMIN C) 500 MG tablet Take 500 mg by mouth daily.   Yes Historical Provider, MD  Biotin (RA BIOTIN) 1000 MCG tablet Take 1,000 mcg by mouth daily.   Yes Historical Provider, MD  ferrous sulfate 325 (65 FE) MG tablet  Take 325 mg by mouth daily with breakfast.   Yes Historical Provider, MD  Flaxseed, Linseed, (FLAXSEED OIL) 1000 MG CAPS Take 1,000 mg by mouth daily.   Yes Historical Provider, MD  GARLIC PO Take 1 capsule by mouth daily.   Yes Historical Provider, MD  insulin NPH-regular Human (NOVOLIN 70/30) (70-30) 100 UNIT/ML injection Inject into the skin 2 (two) times daily with a meal. 48U in am and 28U in pm   Yes Historical Provider, MD  losartan-hydrochlorothiazide (HYZAAR) 100-25 MG per tablet Take 1 tablet by mouth daily.   Yes Historical Provider, MD  metoprolol (LOPRESSOR) 100 MG tablet Take 100 mg by mouth 2 (two) times daily.   Yes Historical Provider, MD  Multiple Vitamins-Minerals (MULTIVITAMIN WITH MINERALS) tablet Take 1 tablet by mouth daily.   Yes Historical Provider, MD  omeprazole (PRILOSEC) 40 MG capsule Take 40 mg by mouth daily.   Yes Historical Provider, MD  pantoprazole (PROTONIX) 40 MG tablet Take 40 mg by mouth daily.   Yes Historical Provider, MD      VITAL SIGNS:  Temperature 97.7, blood pressure 149/104, pulse 60, respirations 18, pulse ox 100% on room air  PHYSICAL EXAMINATION:  GENERAL:  69 y.o.-year-old patient lying in the bed with no acute distress.  EYES: Pupils dilated from the ophthalmologist, round, reactive to light and accommodation. No scleral icterus. Extraocular muscles intact. Left eye lower central vision distortion. HEENT: Head atraumatic, normocephalic. Oropharynx and nasopharynx clear.  NECK:  Supple, no jugular venous distention. No thyroid enlargement, no tenderness.  LUNGS: Normal breath sounds bilaterally, no wheezing, rales,rhonchi or crepitation. No use of accessory muscles of respiration.  CARDIOVASCULAR: S1, S2 normal. 3/6 systolic murmur, no rubs, or gallops.  ABDOMEN: Soft, nontender, nondistended. Bowel sounds present. No organomegaly or mass.  EXTREMITIES: No pedal edema, cyanosis, or clubbing.  NEUROLOGIC: Left eye central vision distortion  and lower field Muscle strength 5/5 in all extremities. Sensation intact. Gait not checked.  PSYCHIATRIC: The patient is alert and oriented x 3.  SKIN: No rash, lesion, or ulcer.   LABORATORY PANEL:   CBC  Recent Labs Lab 09/28/15 1128  WBC 5.3  HGB 10.4*  HCT 32.5*  PLT 287   ------------------------------------------------------------------------------------------------------------------  Chemistries   Recent Labs Lab 09/28/15 1128  NA 140  K 3.9  CL 107  CO2 29  GLUCOSE 87  BUN 15  CREATININE 0.76  CALCIUM 8.9   ------------------------------------------------------------------------------------------------------------------  Cardiac Enzymes  Recent Labs Lab 09/28/15 1128  TROPONINI <0.03   ------------------------------------------------------------------------------------------------------------------  RADIOLOGY:  Dg Chest 2 View  09/28/2015   CLINICAL DATA:  Pt c/o of left eye pain/pressure since last night, headache; non smoker  EXAM: CHEST - 2 VIEW  COMPARISON:  04/02/2013  FINDINGS: Lungs are clear. Heart size upper limits normal. The mediastinal contours are within normal limits. No pneumothorax. No effusion.  Visualized skeletal structures are unremarkable.  IMPRESSION: Borderline cardiomegaly.  Otherwise negative.   Electronically Signed   By: Lucrezia Europe M.D.   On: 09/28/2015 11:52   Ct Head Wo Contrast  09/28/2015   CLINICAL DATA:  Pt was seen at eye dr on Fri for left eye pressure/pain, Dr stated she had a few small bleeds in her left eye. Around midnight, pt states her head began hurting, and her visual field in her left eye decreased. Pt appears anxious  EXAM: CT HEAD WITHOUT CONTRAST  TECHNIQUE: Contiguous axial images were obtained from the base of the skull through the vertex without intravenous contrast.  COMPARISON:  04/02/2013  FINDINGS: Atherosclerotic and physiologic intracranial calcifications. Early mineralization in bilateral basal ganglia as  before. There is no evidence of acute intracranial hemorrhage, brain edema, mass lesion, acute infarction, mass effect, or midline shift. Acute infarct may be inapparent on noncontrast CT. No other intra-axial abnormalities are seen, and the ventricles and sulci are within normal limits in size and symmetry. No abnormal extra-axial fluid collections or masses are identified. No significant calvarial abnormality.  IMPRESSION: 1. Negative for  bleed or other acute intracranial process.   Electronically Signed   By: Lucrezia Europe M.D.   On: 09/28/2015 11:51    EKG:   Sinus bradycardia flattening T waves laterally  IMPRESSION AND PLAN:   1. Acute stroke, central retinal artery occlusion. The patient has a history of peptic ulcer disease and has had numerous transfusions in the past. She has had bleeding in the past on Plavix and aspirin. She is willing to try Aggrenox at this point. I will also start on low-dose statin secondary to patient intolerance to higher dose statins in the past. I will check a lipid profile in the a.m., monitor on telemetry, carotid ultrasound, echocardiogram and MRI of the brain. After the studies are resulted patient can probably go home tomorrow. 2. Accelerated hypertension- allow permissive hypertension at this point. Continue the patient's metoprolol. And if blood pressure still remains high can restart the lisinopril hydrochlorothiazide tomorrow morning. 3. Type 2 diabetes- continue the patient's 70/30 insulin combination. 4. History of aortic stenosis 5. History of coronary artery disease 6. History of arthritis 7. History of thyroid nodules 8. History of peptic ulcer disease- no bleeding since last year when she was taken off Plavix. Need to watch closely for signs of bleeding and check hemoglobin on a routine basis. With the acute stroke, Aggrenox started.  All the records are reviewed and case discussed with ED provider. Management plans discussed with the patient,  family and they are in agreement.  CODE STATUS: Full code  TOTAL TIME TAKING CARE OF THIS PATIENT: 50 minutes.    Loletha Grayer M.D on 09/28/2015 at 4:04 PM  Between 7am to 6pm - Pager - [phone]  After 6pm call admission pager [facility]  Office  [phone]  CC: Primary care physician; Maryland Pink, MD

## 2015-09-28 NOTE — ED Notes (Signed)
Pt sent back to ED from eye doctor for eval of possible retinal artery occlusion. Pt to be admitted for CVA workup.

## 2015-09-28 NOTE — Plan of Care (Signed)
Pt admitted from the ED w/possible retinal artery occlusion which will be tx like a stroke. Was seen by opthamologist - no retinal detachment.   No deficit except for L eye.  Has Hx of DM, peptic ulcer disease, aortic stenosis and CAD - tx w/home meds.  Had CT - negative for bleed. Pt also had MRI and chest xray 2 view. VSS.  No c/o pain.  Moderate Fall - walks independently. Pt hopes to be d/ced tomorrow.

## 2015-09-28 NOTE — ED Provider Notes (Addendum)
[facility] Emergency Department Provider Note  Time seen: 12:41 PM  I have reviewed the triage vital signs and the nursing notes.   HISTORY  Chief Complaint Jaw Pain; Headache; and Visual Field Change    HPI Joyce Potter is a 69 y.o. female with a past medical history of diabetes, hypertension, hyperlipidemia, partial retinal detachment history, presents the emergency department with visual impairment of her left eye. According to the patient around 11 PM last night she noticed flashing lights in her visual field of the left eye along with progressive darkening which she states started in her upper visual field and progressed downward. Today the patient states she is unable to see anything in the lower visual fields but is able to see in the upper visual fields with the left eye. States normal right eye vision. Does state she has a headache, feels somewhat nauseated. Denies any chest pain, shortness of breath, focal weakness or numbness, confusion or slurred speech.     Past Medical History  Diagnosis Date  . IDDM (insulin dependent diabetes mellitus) ([place])   . High cholesterol   . HTN (hypertension)   . Multiple thyroid nodules     Benign  . CAD (coronary artery disease)   . Cataract   . Peptic ulcer disease   . Aortic stenosis   . Hyperlipidemia   . Basal cell carcinoma   . Anemia     Patient Active Problem List   Diagnosis Date Noted  . Anemia 06/24/2015  . GI bleed from an occult source with recurrent chronic blood loss anemia 04/14/2013  . Blood loss anemia 04/14/2013  . CAD (coronary artery disease) with demand ischemia from anemia 04/14/2013  . IDDM (insulin dependent diabetes mellitus) ([place]) 04/14/2013  . HTN (hypertension) 04/14/2013  . Chest pain 04/14/2013    Past Surgical History  Procedure Laterality Date  . Coronary stent placement    . Partial hysterectomy    . Givens capsule study N/A 04/17/2013    Procedure: GIVENS  CAPSULE STUDY;  Surgeon: Arta Silence, MD;  Location: [facility] ENDOSCOPY;  Service: Endoscopy;  Laterality: N/A;  patient ate breakfast at 7am   . Breast biopsy    . Coronary angioplasty    . Eye surgery    . Trigger finger release    . Cardiac catheterization N/A 08/08/2015    Procedure: Right and Left Heart Cath and Coronary Angiography;  Surgeon: Teodoro Spray, MD;  Location: [facility] CV LAB;  Service: Cardiovascular;  Laterality: N/A;    Current Outpatient Rx  Name  Route  Sig  Dispense  Refill  . ascorbic acid (VITAMIN C) 500 MG tablet   Oral   Take 500 mg by mouth daily.         . ferrous sulfate 325 (65 FE) MG tablet   Oral   Take 325 mg by mouth daily with breakfast.         . GARLIC PO   Oral   Take 1 capsule by mouth daily.         . insulin glargine (LANTUS) 100 UNIT/ML injection   Subcutaneous   Inject 65 Units into the skin every morning.          . insulin NPH-regular Human (NOVOLIN 70/30) (70-30) 100 UNIT/ML injection   Subcutaneous   Inject into the skin 2 (two) times daily with a meal. 48U in am and 28U in pm         . losartan-hydrochlorothiazide (  HYZAAR) 100-25 MG per tablet   Oral   Take 1 tablet by mouth daily.         . metoprolol tartrate (LOPRESSOR) 25 MG tablet   Oral   Take 25 mg by mouth 2 (two) times daily.         . Multiple Vitamins-Minerals (MULTIVITAMIN WITH MINERALS) tablet   Oral   Take 1 tablet by mouth daily.         Marland Kitchen omeprazole (PRILOSEC) 40 MG capsule   Oral   Take 40 mg by mouth daily.         . pantoprazole (PROTONIX) 40 MG tablet   Oral   Take 40 mg by mouth daily.         . pravastatin (PRAVACHOL) 40 MG tablet   Oral   Take 40 mg by mouth daily.         . promethazine (PHENERGAN) 25 MG tablet   Oral   Take 25 mg by mouth every 6 (six) hours as needed for nausea.         Marland Kitchen senna (SENOKOT) 8.6 MG TABS   Oral   Take 1 tablet by mouth daily.           Allergies Invokamet; Sulfa  antibiotics; Lovastatin; Penicillins; and Vicodin  Family History  Problem Relation Age of Onset  . Uterine cancer Mother   . Breast cancer Mother   . Colon cancer Neg Hx     Social History Social History  Substance Use Topics  . Smoking status: Former Research scientist (life sciences)  . Smokeless tobacco: None  . Alcohol Use: No    Review of Systems Constitutional: Negative for fever. Eyes: Unable to see in the lower visual fields of her left eye. Cardiovascular: Negative for chest pain. Respiratory: Negative for shortness of breath. Gastrointestinal: Negative for abdominal pain positive for nausea. Neurological: Positive for headache. 10-point ROS otherwise negative.  ____________________________________________   PHYSICAL EXAM:  VITAL SIGNS: ED Triage Vitals  Enc Vitals Group     BP 09/28/15 1124 149/104 mmHg     Pulse Rate 09/28/15 1124 60     Resp 09/28/15 1124 18     Temp 09/28/15 1124 97.7 F (36.5 C)     Temp Source 09/28/15 1124 Oral     SpO2 09/28/15 1124 100 %     Weight 09/28/15 1124 205 lb (92.987 kg)     Height 09/28/15 1124 5\' 4"  (1.626 m)     Head Cir --      Peak Flow --      Pain Score 09/28/15 1125 6     Pain Loc --      Pain Edu? --      Excl. in [place]? --     Constitutional: Alert and oriented. Well appearing and in no distress. Eyes: Normal exam, 2-3 mm PERRL bilaterally. EOMI. On visual field exam the patient is unable to make out fingers in the left lower and right lower visual fields of her left eye. Intact visual fields in the right eye. ENT   Head: Normocephalic and atraumatic.   Mouth/Throat: Mucous membranes are moist. Cardiovascular: Normal rate, regular rhythm. No murmur Respiratory: Normal respiratory effort without tachypnea nor retractions. Breath sounds are clear  Gastrointestinal: Soft and nontender. No distention.   Musculoskeletal: Nontender with normal range of motion in all extremities.  Neurologic:  Normal speech and language. No gross  focal neurologic deficits  Psychiatric: Mood and affect are normal. Speech and behavior are normal.  ____________________________________________    EKG  EKG reviewed and interpreted, such as sinus bradycardia at 58 bpm, narrow QRS, normal axis, normal intervals, nonspecific ST changes present. No ST elevations noted.  ____________________________________________    RADIOLOGY  CT head shows no acute abnormalities. Chest x-ray negative. ____________________________________________    INITIAL IMPRESSION / ASSESSMENT AND PLAN / ED COURSE  Pertinent labs & imaging results that were available during my care of the patient were reviewed by me and considered in my medical decision making (see chart for details).  Labs are within normal limits, CT within normal limits. Patient's history is most consistent with retinal detachment. I be evaluated the patient's eye with an ultrasound, she appears to have a partial retinal detachment on ultrasound imaging of the eye. I discussed the patient with the on-call ophthalmologist, she will meet the patient at [facility] at 1:45 PM (in one hour) for further evaluation. ____________________________________________   FINAL CLINICAL IMPRESSION(S) / ED DIAGNOSES   retinal detachment left   Harvest Dark, MD 09/28/15 1245  Harvest Dark, MD 09/28/15 1248

## 2015-09-28 NOTE — ED Notes (Signed)
Patient transported to MRI 

## 2015-09-28 NOTE — ED Provider Notes (Signed)
-----------------------------------------   3:33 PM on 09/28/2015 -----------------------------------------  Patient has returned from the eye doctor, may believe the patient is suffering from a central retinal artery occlusion. Patient is now back in the emergency department, and will be admitted to the hospitalist service for CVA workup.  Labs and CT had been performed and are within normal limits earlier today.  Harvest Dark, MD 09/28/15 1534

## 2015-09-28 NOTE — Discharge Instructions (Signed)
Proceed directly [facility] ([place]): Address: [address] [place], [place] [address]  Phone: [phone]  Return to the emergency department for any acutely concerning symptoms. Please follow-up with ophthalmology at 1:45 PM today.   Retinal Detachment The retina is a thin, light-sensitive layer that lines the inside of the back of the eye. The retina changes visual images into nerve impulses which are sent to the brain by the optic nerve to produce sight. It must be attached to the back of the eye in order to function. All detailed vision and color perception happens at a precise pin-size point of focus on the retina called the macula. The other 99.9% of retinal surface is for side vision (what your eye can see just off to the side of what you are looking at). A detached retina is the separation of the retina from the back of the eye. A total retinal detachment is when the entire retina separates from the layers below it, and includes the macula. This causes complete blindness in the affected eye. It is more common for just a portion of the retina to detach, in many cases leaving the macula in place. When this happens, central, focused vision is maintained, but a portion of side vision corresponding to the part of the retina that detached is lost. This results in a portion of side vision that is black - like a curtain covering a portion of side vision. CAUSES   One or many small holes or tears in the retinal surface. These holes and tears allow the fluids inside the eyeball (vitreous fluid) to get through the opening and lift the retina up and away from its underlying layers. Since the vitreous fluid is jelly-like in nature and constricts with time, it has the affect of pulling on the retina and can cause holes or tears. People who are very nearsighted are at a higher risk for retinal holes or tears.  Trauma to the eye.  Certain diseases (diabetes, blood clotting disorders,  and others).  Certain physical abnormalities of the eye. SYMPTOMS   Flashes of lights that are caused by the vitreous pulling on the retina.  Floating specks or cobwebs (floaters) seen in front of your eye. These may be caused by bleeding in the eye from a hole or tear of the retina.  A black area or "curtain" that you cannot see through in any part of your side vision.  Detailed vision becomes fuzzy which may be caused by the vitreous pulling on the macula.  A floating empty circle in front of your vision which may be caused by the vitreous pulling away from the optic nerve. This is very common and usually does not affect vision (posterior vitreal detachment). DIAGNOSIS  The diagnosis is made with an exam by an ophthalmologist. The pupils of the patient are made larger (dilated). Retinal detachment can be more easily found if the detachment is large. If it is small or flat, the detachment may be harder to find. Location of retinal holes and tears need an extremely high degree of training and skill. They may need the expertise of an ophthalmologist who specializes in diseases of the retina. Both eyes should be thoroughly examined since holes or tears may exist in both eyes. TREATMENT  Treatment depends on the location, size and nature of the retinal detachment.  Small, localized detachments may be treated on an outpatient basis using a laser.  Larger detachments need surgery to drain the fluid  and use a freezing probe to create scar tissue. The scar tissue will help make the retina adhere to its underlying tissues once it flattens out.  Methods also exist which involve the injection of fluid or air into the vitreous cavity. Visual improvement or return depends on:  How long the retina was detached.  Whether or not the macula was involved.  How well any blood within the vitreous cavity clears with time - and many other factors. HOME CARE INSTRUCTIONS   If you have a retinal  detachment, and you have not been examined by an eye specialist (either an ophthalmologist or a retinal specialist), you must be examined by one of these specialists as soon as possible.  Home care instruction should be given by the retina specialist depending on the type of detachment and the surgery or method that was used to treat it. SEEK IMMEDIATE MEDICAL CARE IF:   You see the sudden onset of flashing lights, floaters and/or a dark area (like a curtain) in any area of your field of version.  The dark area of visual loss is in the lower part of the visual field. This means that the upper retina may have detached. Since fluid runs down, the risk is greater in this type of detachment that the fluid may work its way downward to detach the macula. Any detachment that involves the macula makes the risk of permanent visual loss much higher, even with treatment. Document Released: 12/13/2005 Document Revised: 04/29/2014 Document Reviewed: 11/14/2009 [facility] Patient Information 2015 [place], [place]. This information is not intended to replace advice given to you by your health care provider. Make sure you discuss any questions you have with your health care provider.

## 2015-09-29 ENCOUNTER — Encounter: Payer: Self-pay | Admitting: Radiology

## 2015-09-29 ENCOUNTER — Observation Stay: Payer: Commercial Managed Care - HMO

## 2015-09-29 ENCOUNTER — Observation Stay
Admit: 2015-09-29 | Discharge: 2015-09-29 | Disposition: A | Discharge status: Home / Self Care | Payer: Commercial Managed Care - HMO | Attending: Internal Medicine | Admitting: Internal Medicine

## 2015-09-29 DIAGNOSIS — E785 Hyperlipidemia, unspecified: Secondary | ICD-10-CM | POA: Diagnosis not present

## 2015-09-29 DIAGNOSIS — Z882 Allergy status to sulfonamides status: Secondary | ICD-10-CM | POA: Diagnosis not present

## 2015-09-29 DIAGNOSIS — Z955 Presence of coronary angioplasty implant and graft: Secondary | ICD-10-CM | POA: Diagnosis not present

## 2015-09-29 DIAGNOSIS — Z880 Allergy status to penicillin: Secondary | ICD-10-CM | POA: Diagnosis not present

## 2015-09-29 DIAGNOSIS — Z7902 Long term (current) use of antithrombotics/antiplatelets: Secondary | ICD-10-CM | POA: Diagnosis not present

## 2015-09-29 DIAGNOSIS — Z7982 Long term (current) use of aspirin: Secondary | ICD-10-CM | POA: Diagnosis not present

## 2015-09-29 DIAGNOSIS — I2510 Atherosclerotic heart disease of native coronary artery without angina pectoris: Secondary | ICD-10-CM | POA: Diagnosis not present

## 2015-09-29 DIAGNOSIS — I350 Nonrheumatic aortic (valve) stenosis: Secondary | ICD-10-CM | POA: Diagnosis not present

## 2015-09-29 DIAGNOSIS — H5462 Unqualified visual loss, left eye, normal vision right eye: Secondary | ICD-10-CM | POA: Diagnosis present

## 2015-09-29 DIAGNOSIS — H3412 Central retinal artery occlusion, left eye: Secondary | ICD-10-CM | POA: Diagnosis not present

## 2015-09-29 DIAGNOSIS — Z886 Allergy status to analgesic agent status: Secondary | ICD-10-CM | POA: Diagnosis not present

## 2015-09-29 DIAGNOSIS — E041 Nontoxic single thyroid nodule: Secondary | ICD-10-CM | POA: Diagnosis not present

## 2015-09-29 DIAGNOSIS — Z825 Family history of asthma and other chronic lower respiratory diseases: Secondary | ICD-10-CM | POA: Diagnosis not present

## 2015-09-29 DIAGNOSIS — Z87891 Personal history of nicotine dependence: Secondary | ICD-10-CM | POA: Diagnosis not present

## 2015-09-29 DIAGNOSIS — D649 Anemia, unspecified: Secondary | ICD-10-CM | POA: Diagnosis not present

## 2015-09-29 DIAGNOSIS — Z833 Family history of diabetes mellitus: Secondary | ICD-10-CM | POA: Diagnosis not present

## 2015-09-29 DIAGNOSIS — Z823 Family history of stroke: Secondary | ICD-10-CM | POA: Diagnosis not present

## 2015-09-29 DIAGNOSIS — K279 Peptic ulcer, site unspecified, unspecified as acute or chronic, without hemorrhage or perforation: Secondary | ICD-10-CM | POA: Diagnosis not present

## 2015-09-29 DIAGNOSIS — E119 Type 2 diabetes mellitus without complications: Secondary | ICD-10-CM | POA: Diagnosis not present

## 2015-09-29 DIAGNOSIS — I63132 Cerebral infarction due to embolism of left carotid artery: Secondary | ICD-10-CM | POA: Diagnosis not present

## 2015-09-29 DIAGNOSIS — H269 Unspecified cataract: Secondary | ICD-10-CM | POA: Diagnosis not present

## 2015-09-29 DIAGNOSIS — I10 Essential (primary) hypertension: Secondary | ICD-10-CM | POA: Diagnosis not present

## 2015-09-29 DIAGNOSIS — Z8673 Personal history of transient ischemic attack (TIA), and cerebral infarction without residual deficits: Secondary | ICD-10-CM | POA: Diagnosis not present

## 2015-09-29 DIAGNOSIS — Z85828 Personal history of other malignant neoplasm of skin: Secondary | ICD-10-CM | POA: Diagnosis not present

## 2015-09-29 DIAGNOSIS — E7800 Pure hypercholesterolemia, unspecified: Secondary | ICD-10-CM | POA: Diagnosis not present

## 2015-09-29 DIAGNOSIS — Z8049 Family history of malignant neoplasm of other genital organs: Secondary | ICD-10-CM | POA: Diagnosis not present

## 2015-09-29 DIAGNOSIS — Z803 Family history of malignant neoplasm of breast: Secondary | ICD-10-CM | POA: Diagnosis not present

## 2015-09-29 DIAGNOSIS — Z794 Long term (current) use of insulin: Secondary | ICD-10-CM | POA: Diagnosis not present

## 2015-09-29 LAB — CBC
HCT: 30.5 % — ABNORMAL LOW (ref 35.0–47.0)
Hemoglobin: 9.8 g/dL — ABNORMAL LOW (ref 12.0–16.0)
MCH: 26.7 pg (ref 26.0–34.0)
MCHC: 32 g/dL (ref 32.0–36.0)
MCV: 83.6 fL (ref 80.0–100.0)
PLATELETS: 245 10*3/uL (ref 150–440)
RBC: 3.66 MIL/uL — ABNORMAL LOW (ref 3.80–5.20)
RDW: 14.2 % (ref 11.5–14.5)
WBC: 4.5 10*3/uL (ref 3.6–11.0)

## 2015-09-29 LAB — LIPID PANEL
Cholesterol: 188 mg/dL (ref 0–200)
HDL: 41 mg/dL (ref >40)
LDL Cholesterol: 131 mg/dL — ABNORMAL HIGH (ref 0–99)
Total CHOL/HDL Ratio: 4.6 RATIO
Triglycerides: 81 mg/dL (ref <150)
VLDL: 16 mg/dL (ref 0–40)

## 2015-09-29 LAB — BASIC METABOLIC PANEL
Anion gap: 7 (ref 5–15)
BUN: 15 mg/dL (ref 6–20)
CHLORIDE: 106 mmol/L (ref 101–111)
CO2: 27 mmol/L (ref 22–32)
CREATININE: 0.89 mg/dL (ref 0.44–1.00)
Calcium: 8.8 mg/dL — ABNORMAL LOW (ref 8.9–10.3)
GFR calc Af Amer: >60 mL/min (ref >60)
GFR calc non Af Amer: >60 mL/min (ref >60)
Glucose, Bld: 149 mg/dL — ABNORMAL HIGH (ref 65–99)
Potassium: 4 mmol/L (ref 3.5–5.1)
SODIUM: 140 mmol/L (ref 135–145)

## 2015-09-29 LAB — GLUCOSE, CAPILLARY
GLUCOSE-CAPILLARY: 146 mg/dL — AB (ref 65–99)
GLUCOSE-CAPILLARY: 107 mg/dL — AB (ref 65–99)
GLUCOSE-CAPILLARY: 53 mg/dL — AB (ref 65–99)
GLUCOSE-CAPILLARY: 127 mg/dL — AB (ref 65–99)
Glucose-Capillary: 236 mg/dL — ABNORMAL HIGH (ref 65–99)

## 2015-09-29 LAB — SEDIMENTATION RATE: SED RATE: 46 mm/h — AB (ref 0–30)

## 2015-09-29 MED ORDER — PRAVASTATIN SODIUM 10 MG PO TABS: 10.0000 mg | ORAL_TABLET | Freq: Every day | ORAL | Status: DC

## 2015-09-29 MED ORDER — SODIUM CHLORIDE 0.9 % IJ SOLN
INTRAMUSCULAR | Status: AC
  Administered 2015-09-29: 10:00:00 3 mL via INTRAVENOUS
  Filled 2015-09-29: qty 3

## 2015-09-29 MED ORDER — CLOPIDOGREL BISULFATE 75 MG PO TABS
75.0000 mg | ORAL_TABLET | Freq: Every day | ORAL | Status: DC
  Administered 2015-09-29 – 2015-09-30 (×2): 75 mg via ORAL
  Filled 2015-09-29 (×2): qty 1

## 2015-09-29 MED ORDER — IOHEXOL 350 MG/ML SOLN
100.0000 mL | Freq: Once | INTRAVENOUS | Status: AC | PRN
  Administered 2015-09-29: 15:00:00 100 mL via INTRAVENOUS

## 2015-09-29 MED ORDER — CLOPIDOGREL BISULFATE 75 MG PO TABS: 75.0000 mg | ORAL_TABLET | Freq: Every day | ORAL | Status: DC

## 2015-09-29 NOTE — Plan of Care (Signed)
Problem: Discharge/Transitional Outcomes Goal: Barriers To Progression Addressed/Resolved Outcome: Progressing Pt admitted for retinal artery occlusion.  Negative for stroke but had neurology consult.  CTA ordered and showed > 65% L ICA stenosis.  Concerning to neurologist who believes she is at high risk for stroke if this isn't repaired and he called for vascular consult. Pt has hx of bleeding peptic ulcer - didn't want to take aggrenox, but ok to take Plavix.   Goal: Hemodynamically stable Outcome: Progressing VSS.   Goal: Other Discharge Outcomes/Goals Outcome: Progressing Pt continues to have visual deficit in L eye.  Can see a person from nose upward.  Will not be able to drive upon d/c.  Pt's d/c is on hold until vascular consult and she will f/u outpt w/eye clinic.

## 2015-09-29 NOTE — Consult Note (Signed)
Reason for Consult: L vision loss Referring Physician: Dr. Berneta Sages is an 69 y.o. female.  HPI:  Seen at request of Dr. Benjie Karvonen for vision loss;  69 yo RHD F presents to [facility] after having L vision loss.  She saw an optometrist before admission who stated that she had a central retinal artery occlusion.  Pt denies jaw pain, headache, fever or chills.  This has never happened before.  Pt has not had any changes since being here.  Past Medical History  Diagnosis Date  . IDDM (insulin dependent diabetes mellitus) ([place])   . High cholesterol   . HTN (hypertension)   . Multiple thyroid nodules     Benign  . CAD (coronary artery disease)   . Cataract   . Peptic ulcer disease   . Aortic stenosis   . Hyperlipidemia   . Basal cell carcinoma   . Anemia     Past Surgical History  Procedure Laterality Date  . Coronary stent placement    . Partial hysterectomy    . Givens capsule study N/A 04/17/2013    Procedure: GIVENS CAPSULE STUDY;  Surgeon: Arta Silence, MD;  Location: [facility] ENDOSCOPY;  Service: Endoscopy;  Laterality: N/A;  patient ate breakfast at 7am   . Breast biopsy    . Coronary angioplasty    . Eye surgery    . Trigger finger release    . Cardiac catheterization N/A 08/08/2015    Procedure: Right and Left Heart Cath and Coronary Angiography;  Surgeon: Teodoro Spray, MD;  Location: [facility];  Service: Cardiovascular;  Laterality: N/A;    Family History  Problem Relation Age of Onset  . Uterine cancer Mother   . Breast cancer Mother   . Colon cancer Neg Hx   . Seizures Father   . Stroke Father   . Diabetes Father   . COPD Father     Social History:  reports that she has quit smoking. She does not have any smokeless tobacco history on file. She reports that she does not drink alcohol or use illicit drugs.  Allergies:  Allergies  Allergen Reactions  . Aspirin Other (See Comments)    ulcers  . Invokamet [Canagliflozin-Metformin Hcl] Other (See  Comments)    Yeast infection  . Sulfa Antibiotics Other (See Comments)    "Got Drunk"  . Lovastatin Rash  . Penicillins Rash  . Vicodin [Hydrocodone-Acetaminophen] Rash    Medications: personally reviewed by me as per chart  Results for orders placed or performed during the hospital encounter of 09/28/15 (from the past 48 hour(s))  Glucose, capillary     Status: Abnormal   Collection Time: 09/28/15  4:10 PM  Result Value Ref Range   Glucose-Capillary 210 (H) 65 - 99 mg/dL  Glucose, capillary     Status: Abnormal   Collection Time: 09/28/15  9:01 PM  Result Value Ref Range   Glucose-Capillary 175 (H) 65 - 99 mg/dL  Basic metabolic panel     Status: Abnormal   Collection Time: 09/29/15  4:13 AM  Result Value Ref Range   Sodium 140 135 - 145 mmol/L   Potassium 4.0 3.5 - 5.1 mmol/L   Chloride 106 101 - 111 mmol/L   CO2 27 22 - 32 mmol/L   Glucose, Bld 149 (H) 65 - 99 mg/dL   BUN 15 6 - 20 mg/dL   Creatinine, Ser 0.89 0.44 - 1.00 mg/dL   Calcium 8.8 (L) 8.9 - 10.3 mg/dL  GFR calc non Af Amer >60 >60 mL/min   GFR calc Af Amer >60 >60 mL/min    Comment: (NOTE) The eGFR has been calculated using the CKD EPI equation. This calculation has not been validated in all clinical situations. eGFR's persistently <60 mL/min signify possible Chronic Kidney Disease.    Anion gap 7 5 - 15  CBC     Status: Abnormal   Collection Time: 09/29/15  4:13 AM  Result Value Ref Range   WBC 4.5 3.6 - 11.0 K/uL   RBC 3.66 (L) 3.80 - 5.20 MIL/uL   Hemoglobin 9.8 (L) 12.0 - 16.0 g/dL   HCT 30.5 (L) 35.0 - 47.0 %   MCV 83.6 80.0 - 100.0 fL   MCH 26.7 26.0 - 34.0 pg   MCHC 32.0 32.0 - 36.0 g/dL   RDW 14.2 11.5 - 14.5 %   Platelets 245 150 - 440 K/uL  Lipid panel     Status: Abnormal   Collection Time: 09/29/15  4:13 AM  Result Value Ref Range   Cholesterol 188 0 - 200 mg/dL   Triglycerides 81 <150 mg/dL   HDL 41 >40 mg/dL   Total CHOL/HDL Ratio 4.6 RATIO   VLDL 16 0 - 40 mg/dL   LDL  Cholesterol 131 (H) 0 - 99 mg/dL    Comment:        Total Cholesterol/HDL:CHD Risk Coronary Heart Disease Risk Table                     Men   Women  1/2 Average Risk   3.4   3.3  Average Risk       5.0   4.4  2 X Average Risk   9.6   7.1  3 X Average Risk  23.4   11.0        Use the calculated Patient Ratio above and the CHD Risk Table to determine the patient's CHD Risk.        ATP III CLASSIFICATION (LDL):  <100     mg/dL   Optimal  100-129  mg/dL   Near or Above                    Optimal  130-159  mg/dL   Borderline  160-189  mg/dL   High  >190     mg/dL   Very High   Glucose, capillary     Status: Abnormal   Collection Time: 09/29/15  7:19 AM  Result Value Ref Range   Glucose-Capillary 146 (H) 65 - 99 mg/dL  Glucose, capillary     Status: Abnormal   Collection Time: 09/29/15 11:20 AM  Result Value Ref Range   Glucose-Capillary 236 (H) 65 - 99 mg/dL    Dg Chest 2 View  09/28/2015   CLINICAL DATA:  Pt c/o of left eye pain/pressure since last night, headache; non smoker  EXAM: CHEST - 2 VIEW  COMPARISON:  04/02/2013  FINDINGS: Lungs are clear. Heart size upper limits normal. The mediastinal contours are within normal limits. No pneumothorax. No effusion. Visualized skeletal structures are unremarkable.  IMPRESSION: Borderline cardiomegaly.  Otherwise negative.   Electronically Signed   By: Lucrezia Europe M.D.   On: 09/28/2015 11:52   Ct Head Wo Contrast  09/28/2015   CLINICAL DATA:  Pt was seen at eye dr on Fri for left eye pressure/pain, Dr stated she had a few small bleeds in her left eye. Around midnight, pt states her head  began hurting, and her visual field in her left eye decreased. Pt appears anxious  EXAM: CT HEAD WITHOUT CONTRAST  TECHNIQUE: Contiguous axial images were obtained from the base of the skull through the vertex without intravenous contrast.  COMPARISON:  04/02/2013  FINDINGS: Atherosclerotic and physiologic intracranial calcifications. Early mineralization in  bilateral basal ganglia as before. There is no evidence of acute intracranial hemorrhage, brain edema, mass lesion, acute infarction, mass effect, or midline shift. Acute infarct may be inapparent on noncontrast CT. No other intra-axial abnormalities are seen, and the ventricles and sulci are within normal limits in size and symmetry. No abnormal extra-axial fluid collections or masses are identified. No significant calvarial abnormality.  IMPRESSION: 1. Negative for  bleed or other acute intracranial process.   Electronically Signed   By: Lucrezia Europe M.D.   On: 09/28/2015 11:51   Mr Brain Wo Contrast  09/28/2015   CLINICAL DATA:  Stroke symptoms. Vision loss LEFT eye. LEFT facial weakness.  EXAM: MRI HEAD WITHOUT CONTRAST  TECHNIQUE: Multiplanar, multiecho pulse sequences of the brain and surrounding structures were obtained without intravenous contrast.  COMPARISON:  None.  FINDINGS: No evidence for acute infarction, hemorrhage, mass lesion, hydrocephalus, or extra-axial fluid. Mild atrophy. Mild subcortical and periventricular T2 and FLAIR hyperintensities, likely chronic microvascular ischemic change. Flow voids are maintained throughout the carotid, basilar, and vertebral arteries. There are no areas of chronic hemorrhage. Pituitary, pineal, and cerebellar tonsils unremarkable. No upper cervical lesions. Visualized calvarium, skull base, and upper cervical osseous structures unremarkable. Scalp and extracranial soft tissues, orbits, sinuses, and mastoids show no acute process.  IMPRESSION: Chronic changes as described.  No acute stroke is evident.  No cause seen patient's symptoms.   Electronically Signed   By: Staci Righter M.D.   On: 09/28/2015 17:35   US Carotid Bilateral  09/29/2015   CLINICAL DATA:  Stroke.  EXAM: BILATERAL CAROTID DUPLEX ULTRASOUND  TECHNIQUE: Pearline Cables scale imaging, color Doppler and duplex ultrasound were performed of bilateral carotid and vertebral arteries in the neck.  COMPARISON:   None.  FINDINGS: Criteria: Quantification of carotid stenosis is based on velocity parameters that correlate the residual internal carotid diameter with NASCET-based stenosis levels, using the diameter of the distal internal carotid lumen as the denominator for stenosis measurement.  The following velocity measurements were obtained:  RIGHT  ICA:  95/25 cm/sec  CCA:  78/29 cm/sec  SYSTOLIC ICA/CCA RATIO:  1.2  DIASTOLIC ICA/CCA RATIO:  1.3  ECA:  61 cm/sec  LEFT  ICA:  104/28 cm/sec  CCA:  56/21 cm/sec  SYSTOLIC ICA/CCA RATIO:  1.2  DIASTOLIC ICA/CCA RATIO:  1.6  ECA:  61 cm/sec  RIGHT CAROTID ARTERY: Mild right carotid bifurcation plaque noted. No flow limiting stenosis.  RIGHT VERTEBRAL ARTERY:  Patent with antegrade flow.  LEFT CAROTID ARTERY: Mild left carotid bifurcation plaque. No flow limiting stenosis.  LEFT VERTEBRAL ARTERY:  Patent antegrade flow.  IMPRESSION: 1. Mild bilateral carotid bifurcation plaque. No flow limiting stenosis. Degree of stenosis less than 50%. 2. Vertebral arteries are patent with antegrade flow .   Electronically Signed   By: Marcello Moores  Register   On: 09/29/2015 10:30    Review of Systems  Constitutional: Negative.   HENT: Negative.   Eyes: Positive for blurred vision. Negative for double vision, photophobia, pain, discharge and redness.  Respiratory: Negative.   Cardiovascular: Negative.   Gastrointestinal: Negative.   Genitourinary: Negative.   Musculoskeletal: Negative.   Skin: Negative.   Neurological: Negative.  Psychiatric/Behavioral: Negative.    Blood pressure 120/53, pulse 61, temperature 98.2 F (36.8 C), temperature source Oral, resp. rate 18, height _0  (1.626 m), weight 92.987 kg (205 lb), SpO2 97 %. Physical Exam  Nursing note and vitals reviewed. Constitutional: She is oriented to person, place, and time. She appears well-developed and well-nourished.  HENT:  Head: Normocephalic and atraumatic.  Right Ear: External ear normal.  Left Ear:  External ear normal.  Nose: Nose normal.  Mouth/Throat: Oropharynx is clear and moist.  Eyes: Conjunctivae and EOM are normal. Pupils are equal, round, and reactive to light.  Cant see in lower half of L eye vision field only  Neck: Normal range of motion. Neck supple.  Cardiovascular: Normal rate, regular rhythm, normal heart sounds and intact distal pulses.   Respiratory: Effort normal and breath sounds normal.  GI: Soft. Bowel sounds are normal.  Musculoskeletal: Normal range of motion.  Neurological: She is alert and oriented to person, place, and time. She has normal reflexes. She displays normal reflexes. No cranial nerve deficit. She exhibits normal muscle tone. Coordination normal.  Psychiatric: She has a normal mood and affect.   MRI personally reviewed by me and shows trace white matter changes  Assessment/Plan: 1.  L central retinal occlusion-  Stable, suspect embolic source and possible carotid even with normal [place] -  Start ASA 27m daily -  Recommend statin for goal LDL < 70 -  Outpatient BP goal < 130/80 -  CTA of head and neck to r/o carotid stenosis -  Pending ESR and CRP -  Will follow results but pt can go home if above neg  Rital Cavey 09/29/2015, 2:39 PM

## 2015-09-29 NOTE — Progress Notes (Signed)
*  PRELIMINARY RESULTS* Echocardiogram 2D Echocardiogram has been performed.  Joyce Potter 09/29/2015, 3:02 PM

## 2015-09-29 NOTE — Discharge Summary (Signed)
[place] [facility] [place] NAME: Joyce Potter    MR#:  [id number]  DATE OF BIRTH:  01/12/1946  DATE OF ADMISSION:  09/28/2015 ADMITTING PHYSICIAN: Loletha Grayer, MD  DATE OF DISCHARGE: 09/29/2015 PRIMARY CARE PHYSICIAN: [place] Pink, MD    ADMISSION DIAGNOSIS:  Stroke [facility]) [I63.9] Central retinal artery occlusion, left [H34.12]  DISCHARGE DIAGNOSIS:  Active Problems:   Stroke [facility])   SECONDARY DIAGNOSIS:   Past Medical History  Diagnosis Date  . IDDM (insulin dependent diabetes mellitus) ([place])   . High cholesterol   . HTN (hypertension)   . Multiple thyroid nodules     Benign  . CAD (coronary artery disease)   . Cataract   . Peptic ulcer disease   . Aortic stenosis   . Hyperlipidemia   . Basal cell carcinoma   . Anemia     HOSPITAL COURSE:  This is a very pleasant 69 year old female with a history of diabetes, peptic ulcer disease and aortic stenosis who presented with loss of vision in her left eye. For further details please further H&P.  1. Central artery retinal occlusion: Patient saw the ophthalmologist prior to admission and was ruled out for retinal detachment. She underwent stroke workup including carotid Doppler, MRI and 2-D echocardiogram. Neurology was also consulted. Patient was started on Plavix and statin medication. She has had history of GI bleed on Plavix and aspirin, however she was willing to try Plavix rather than Aggrenox. She has no evidence of embolism or stenosis on carotid Doppler. She'll need to follow-up with ophthalmology within 1 week's time. She was told that she cannot drive at this time.  2. Type 2 diabetes without complication: Patient continue her outpatient regimen of 70/30 insulin.  3. Hyperlipidemia: Patient's LDL is 131 which is not at goal. She was started on low-dose statin. She is intolerant to high-dose statins.  4. History of aortic stenosis: Patient follows with Dr.  Satira Mccallum.  5. History of peptic ulcer disease: Patient has had no bleeding since she's been off of Plavix. We did review side effects, alternatives and benefits of Plavix and other blood thinners. She will try Plavix and have her hemoglobin monitored.  DISCHARGE CONDITIONS AND DIET:  Patient will be discharged home in stable condition on a heart healthy/diabetic diet  CONSULTS OBTAINED:     DRUG ALLERGIES:   Allergies  Allergen Reactions  . Aspirin Other (See Comments)    ulcers  . Invokamet [Canagliflozin-Metformin Hcl] Other (See Comments)    Yeast infection  . Sulfa Antibiotics Other (See Comments)    "Got Drunk"  . Lovastatin Rash  . Penicillins Rash  . Vicodin [Hydrocodone-Acetaminophen] Rash    DISCHARGE MEDICATIONS:   Current Discharge Medication List    CONTINUE these medications which have NOT CHANGED   Details  ascorbic acid (VITAMIN C) 500 MG tablet Take 500 mg by mouth daily.    Biotin (RA BIOTIN) 1000 MCG tablet Take 1,000 mcg by mouth daily.    ferrous sulfate 325 (65 FE) MG tablet Take 325 mg by mouth daily with breakfast.    Flaxseed, Linseed, (FLAXSEED OIL) 1000 MG CAPS Take 1,000 mg by mouth daily.    GARLIC PO Take 1 capsule by mouth daily.    insulin NPH-regular Human (NOVOLIN 70/30) (70-30) 100 UNIT/ML injection Inject into the skin 2 (two) times daily with a meal. 48U in am and 28U in pm    losartan-hydrochlorothiazide (HYZAAR) 100-25 MG per tablet Take 1 tablet  by mouth daily.    metoprolol (LOPRESSOR) 100 MG tablet Take 100 mg by mouth 2 (two) times daily.    Multiple Vitamins-Minerals (MULTIVITAMIN WITH MINERALS) tablet Take 1 tablet by mouth daily.    omeprazole (PRILOSEC) 40 MG capsule Take 40 mg by mouth daily.    pantoprazole (PROTONIX) 40 MG tablet Take 40 mg by mouth daily.              Today   CHIEF COMPLAINT:  Patient is doing fairly well this morning. Patient continues of vision loss in her left eye.   VITAL SIGNS:   Blood pressure 117/68, pulse 61, temperature 97.7 F (36.5 C), temperature source Oral, resp. rate 18, height 5\' 4"  (1.626 m), weight 92.987 kg (205 lb), SpO2 99 %.   REVIEW OF SYSTEMS:  Review of Systems  Constitutional: Negative for fever, chills and malaise/fatigue.  HENT: Negative for sore throat.   Eyes: Negative for blurred vision.       Left eye vision loss  Respiratory: Negative for cough, hemoptysis, shortness of breath and wheezing.   Cardiovascular: Negative for chest pain, palpitations and leg swelling.  Gastrointestinal: Negative for nausea, vomiting, abdominal pain, diarrhea and blood in stool.  Genitourinary: Negative for dysuria.  Musculoskeletal: Negative for back pain.  Neurological: Negative for dizziness, tremors and headaches.  Endo/Heme/Allergies: Does not bruise/bleed easily.     PHYSICAL EXAMINATION:  GENERAL:  69 y.o.-year-old patient lying in the bed with no acute distress.  NECK:  Supple, no jugular venous distention. No thyroid enlargement, no tenderness.  LUNGS: Normal breath sounds bilaterally, no wheezing, rales,rhonchi  No use of accessory muscles of respiration.  CARDIOVASCULAR: S1, S2 normal. 3/6 systolic ejection murmur, no rubs, or gallops.  ABDOMEN: Soft, non-tender, non-distended. Bowel sounds present. No organomegaly or mass.  EXTREMITIES: No pedal edema, cyanosis, or clubbing.  PSYCHIATRIC: The patient is alert and oriented x 3.  SKIN: No obvious rash, lesion, or ulcer.   DATA REVIEW:   CBC  Recent Labs Lab 09/29/15 0413  WBC 4.5  HGB 9.8*  HCT 30.5*  PLT 245    Chemistries   Recent Labs Lab 09/29/15 0413  NA 140  K 4.0  CL 106  CO2 27  GLUCOSE 149*  BUN 15  CREATININE 0.89  CALCIUM 8.8*    Cardiac Enzymes  Recent Labs Lab 09/28/15 1128  [place] <0.03    Microbiology Results  @[email or web address] @  RADIOLOGY:  Dg Chest 2 View  09/28/2015   CLINICAL DATA:  Pt c/o of left eye pain/pressure since last night,  headache; non smoker  EXAM: CHEST - 2 VIEW  COMPARISON:  04/02/2013  FINDINGS: Lungs are clear. Heart size upper limits normal. The mediastinal contours are within normal limits. No pneumothorax. No effusion. Visualized skeletal structures are unremarkable.  IMPRESSION: Borderline cardiomegaly.  Otherwise negative.   Electronically Signed   By: Lucrezia Europe M.D.   On: 09/28/2015 11:52   Ct Head Wo Contrast  09/28/2015   CLINICAL DATA:  Pt was seen at eye dr on Fri for left eye pressure/pain, Dr stated she had a few small bleeds in her left eye. Around midnight, pt states her head began hurting, and her visual field in her left eye decreased. Pt appears anxious  EXAM: CT HEAD WITHOUT CONTRAST  TECHNIQUE: Contiguous axial images were obtained from the base of the skull through the vertex without intravenous contrast.  COMPARISON:  04/02/2013  FINDINGS: Atherosclerotic and physiologic intracranial calcifications. Early mineralization in bilateral basal ganglia  as before. There is no evidence of acute intracranial hemorrhage, brain edema, mass lesion, acute infarction, mass effect, or midline shift. Acute infarct may be inapparent on noncontrast CT. No other intra-axial abnormalities are seen, and the ventricles and sulci are within normal limits in size and symmetry. No abnormal extra-axial fluid collections or masses are identified. No significant calvarial abnormality.  IMPRESSION: 1. Negative for  bleed or other acute intracranial process.   Electronically Signed   By: Lucrezia Europe M.D.   On: 09/28/2015 11:51   Mr Brain Wo Contrast  09/28/2015   CLINICAL DATA:  Stroke symptoms. Vision loss LEFT eye. LEFT facial weakness.  EXAM: MRI HEAD WITHOUT CONTRAST  TECHNIQUE: Multiplanar, multiecho pulse sequences of the brain and surrounding structures were obtained without intravenous contrast.  COMPARISON:  None.  FINDINGS: No evidence for acute infarction, hemorrhage, mass lesion, hydrocephalus, or extra-axial fluid.  Mild atrophy. Mild subcortical and periventricular T2 and FLAIR hyperintensities, likely chronic microvascular ischemic change. Flow voids are maintained throughout the carotid, basilar, and vertebral arteries. There are no areas of chronic hemorrhage. Pituitary, pineal, and cerebellar tonsils unremarkable. No upper cervical lesions. Visualized calvarium, skull base, and upper cervical osseous structures unremarkable. Scalp and extracranial soft tissues, orbits, sinuses, and mastoids show no acute process.  IMPRESSION: Chronic changes as described.  No acute stroke is evident.  No cause seen patient's symptoms.   Electronically Signed   By: Staci Righter M.D.   On: 09/28/2015 17:35   US Carotid Bilateral  09/29/2015   CLINICAL DATA:  Stroke.  EXAM: BILATERAL CAROTID DUPLEX ULTRASOUND  TECHNIQUE: Pearline Cables scale imaging, color Doppler and duplex ultrasound were performed of bilateral carotid and vertebral arteries in the neck.  COMPARISON:  None.  FINDINGS: Criteria: Quantification of carotid stenosis is based on velocity parameters that correlate the residual internal carotid diameter with NASCET-based stenosis levels, using the diameter of the distal internal carotid lumen as the denominator for stenosis measurement.  The following velocity measurements were obtained:  RIGHT  ICA:  95/25 cm/sec  CCA:  78/29 cm/sec  SYSTOLIC ICA/CCA RATIO:  1.2  DIASTOLIC ICA/CCA RATIO:  1.3  ECA:  61 cm/sec  LEFT  ICA:  104/28 cm/sec  CCA:  56/21 cm/sec  SYSTOLIC ICA/CCA RATIO:  1.2  DIASTOLIC ICA/CCA RATIO:  1.6  ECA:  61 cm/sec  RIGHT CAROTID ARTERY: Mild right carotid bifurcation plaque noted. No flow limiting stenosis.  RIGHT VERTEBRAL ARTERY:  Patent with antegrade flow.  LEFT CAROTID ARTERY: Mild left carotid bifurcation plaque. No flow limiting stenosis.  LEFT VERTEBRAL ARTERY:  Patent antegrade flow.  IMPRESSION: 1. Mild bilateral carotid bifurcation plaque. No flow limiting stenosis. Degree of stenosis less than 50%. 2.  Vertebral arteries are patent with antegrade flow .   Electronically Signed   By: Marcello Moores  Register   On: 09/29/2015 10:30      Management plans discussed with the patient and sje is in agreement. Stable for discharge home  Patient should follow up with PCP and OPTHO in 1 week  CODE STATUS:     Code Status Orders        Start     Ordered   09/28/15 1554  Full code   Continuous     09/28/15 1553      TOTAL TIME TAKING CARE OF THIS PATIENT: 35 minutes.    Virgal Warmuth M.D on 09/29/2015 at 10:54 AM  Between 7am to 6pm - Pager - [phone] After 6pm go to [email or web address] - password [email or web address]  [facility]  Office  [phone]  CC: Primary care physician; [place] Pink, MD

## 2015-09-29 NOTE — Progress Notes (Signed)
NEUROLOGY  CTA reviewed and shows greater than 65% stenosis on L.  This lesion is symptomatic and therefore vascular surgery should repair immediately if possible.  Vascular consult placed and do not discharge patient until they have been seen by vascular.  Pt is at high risk for stroke without repair.  Ok to add plavix and asa until opinion from Vascular.

## 2015-09-30 LAB — GLUCOSE, CAPILLARY
GLUCOSE-CAPILLARY: 156 mg/dL — AB (ref 65–99)
GLUCOSE-CAPILLARY: 90 mg/dL (ref 65–99)
Glucose-Capillary: 107 mg/dL — ABNORMAL HIGH (ref 65–99)
Glucose-Capillary: 115 mg/dL — ABNORMAL HIGH (ref 65–99)
Glucose-Capillary: 136 mg/dL — ABNORMAL HIGH (ref 65–99)

## 2015-09-30 LAB — HIGH SENSITIVITY CRP: CRP HIGH SENSITIVITY: 9.85 mg/L — AB (ref 0.00–3.00)

## 2015-09-30 MED ORDER — SODIUM CHLORIDE 0.9 % IJ SOLN: 3.0000 mL | Freq: Two times a day (BID) | INTRAMUSCULAR | Status: DC

## 2015-09-30 MED ORDER — INSULIN ASPART PROT & ASPART (70-30 MIX) 100 UNIT/ML ~~LOC~~ SUSP
24.0000 [IU] | Freq: Every day | SUBCUTANEOUS | Status: DC
  Administered 2015-09-30 – 2015-10-01 (×2): 24 [IU] via SUBCUTANEOUS
  Filled 2015-09-30: qty 24

## 2015-09-30 MED ORDER — INSULIN ASPART PROT & ASPART (70-30 MIX) 100 UNIT/ML ~~LOC~~ SUSP: 48.0000 [IU] | Freq: Every day | SUBCUTANEOUS | Status: DC

## 2015-09-30 MED ORDER — SODIUM CHLORIDE 0.9 % IJ SOLN
3.0000 mL | INTRAMUSCULAR | Status: DC | PRN
  Administered 2015-09-30: 19:00:00 3 mL via INTRAVENOUS

## 2015-09-30 MED ORDER — SODIUM CHLORIDE 0.9 % IV SOLN
INTRAVENOUS | Status: DC
  Administered 2015-10-01: via INTRAVENOUS

## 2015-09-30 MED ORDER — VANCOMYCIN HCL IN DEXTROSE 1-5 GM/200ML-% IV SOLN
1000.0000 mg | INTRAVENOUS | Status: DC
  Filled 2015-09-30: qty 200

## 2015-09-30 MED ORDER — INSULIN ASPART PROT & ASPART (70-30 MIX) 100 UNIT/ML ~~LOC~~ SUSP
24.0000 [IU] | Freq: Every day | SUBCUTANEOUS | Status: AC
  Filled 2015-09-30: qty 24

## 2015-09-30 NOTE — Progress Notes (Signed)
[facility] [place] NAME: Joyce Potter    MR#:  [id number]  DATE OF BIRTH:  11/30/1946  SUBJECTIVE:  Patient still with visual loss of left eye no other issues awaiting vascular surgery consult  REVIEW OF SYSTEMS:    Review of Systems  Constitutional: Negative for fever, chills and malaise/fatigue.  HENT: Negative for sore throat.        Left-sided visual loss and central vision not peripheral  Eyes: Negative for blurred vision.  Respiratory: Negative for cough, hemoptysis, shortness of breath and wheezing.   Cardiovascular: Negative for chest pain, palpitations and leg swelling.  Gastrointestinal: Negative for nausea, vomiting, abdominal pain, diarrhea and blood in stool.  Genitourinary: Negative for dysuria.  Musculoskeletal: Negative for back pain.  Neurological: Negative for dizziness, tremors and headaches.  Endo/Heme/Allergies: Does not bruise/bleed easily.    Tolerating Diet: Yes      DRUG ALLERGIES:   Allergies  Allergen Reactions  . Aspirin Other (See Comments)    ulcers  . Invokamet [Canagliflozin-Metformin Hcl] Other (See Comments)    Yeast infection  . Sulfa Antibiotics Other (See Comments)    "Got Drunk"  . Lovastatin Rash  . Penicillins Rash  . Vicodin [Hydrocodone-Acetaminophen] Rash    VITALS:  Blood pressure 126/52, pulse 62, temperature 97.5 F (36.4 C), temperature source Oral, resp. rate 18, height 5\' 4"  (1.626 m), weight 92.987 kg (205 lb), SpO2 94 %.  PHYSICAL EXAMINATION:   Physical Exam  Constitutional: She is oriented to person, place, and time and well-developed, well-nourished, and in no distress. No distress.  HENT:  Head: Normocephalic.  Eyes: No scleral icterus.  Left eye central vision loss  Neck: Normal range of motion. Neck supple. No JVD present. No tracheal deviation present.  Cardiovascular: Normal rate, regular rhythm and normal heart sounds.  Exam reveals no gallop  and no friction rub.   No murmur heard. Pulmonary/Chest: Effort normal and breath sounds normal. No respiratory distress. She has no wheezes. She has no rales. She exhibits no tenderness.  Abdominal: Soft. Bowel sounds are normal. She exhibits no distension and no mass. There is no tenderness. There is no rebound and no guarding.  Musculoskeletal: Normal range of motion. She exhibits no edema.  Neurological: She is alert and oriented to person, place, and time.  Skin: Skin is warm. No rash noted. No erythema.  Psychiatric: Affect and judgment normal.      LABORATORY PANEL:   CBC  Recent Labs Lab 09/29/15 0413  WBC 4.5  HGB 9.8*  HCT 30.5*  PLT 245   ------------------------------------------------------------------------------------------------------------------  Chemistries   Recent Labs Lab 09/29/15 0413  NA 140  K 4.0  CL 106  CO2 27  GLUCOSE 149*  BUN 15  CREATININE 0.89  CALCIUM 8.8*   ------------------------------------------------------------------------------------------------------------------  Cardiac Enzymes  Recent Labs Lab 09/28/15 1128  TROPONINI <0.03   ------------------------------------------------------------------------------------------------------------------  RADIOLOGY:  Ct Angio Head W/cm &/or Wo Cm  09/29/2015   CLINICAL DATA:  69 year old female with left face weakness and left side vision loss. Central retinal artery occlusion suspected. Initial encounter.  EXAM: CT ANGIOGRAPHY HEAD AND NECK  TECHNIQUE: Multidetector CT imaging of the head and neck was performed using the standard protocol during bolus administration of intravenous contrast. Multiplanar CT image reconstructions and MIPs were obtained to evaluate the vascular anatomy. Carotid stenosis measurements (when applicable) are obtained utilizing NASCET criteria, using the distal internal carotid diameter as the denominator.  CONTRAST:  122mL  OMNIPAQUE IOHEXOL 350 MG/ML SOLN   COMPARISON:  Brain MRI and Head CT without contrast 09/28/2015.  FINDINGS: CT HEAD  Brain: Stable gray-white matter differentiation throughout the brain. No midline shift, mass effect, or evidence of intracranial mass lesion. No ventriculomegaly. No acute intracranial hemorrhage identified. Cerebral volume is normal for age.  Calvarium and skull base:  No acute osseous abnormality identified.  Paranasal sinuses: Stable bubbly opacity in the sphenoid sinuses. Mild left maxillary sinus mucosal thickening.  Orbits: Prior cataract surgery changes to both globes, otherwise negative.  CTA NECK  Skeleton: Absent dentition. Degenerative changes in the cervical spine. No acute osseous abnormality identified.  Other neck: Mosaic attenuation in the visualized upper lobes. No superior mediastinal lymphadenopathy.  Thyroid, larynx, pharynx, parapharyngeal spaces, retropharyngeal space, sublingual space, submandibular glands, and parotid glands are within normal limits. No cervical lymphadenopathy.  Aortic arch: 3 vessel arch configuration. Intermittent soft and calcified arch atherosclerosis. No great vessel origin stenosis.  Right carotid system: Streak artifact from dense left subclavian vein contrast mildly obscures the right CCA origin. Otherwise negative right CCA proximal to the carotid bifurcation. At the bifurcation there is soft and calcified plaque affecting the right ICA origin and bulb, but subsequent stenosis is less than 50 % with respect to the distal vessel. Mildly tortuous but otherwise negative cervical right ICA.  Left carotid system: Negative left CCA proximal to the carotid bifurcation. At the left carotid bifurcation there is calcified plaque affecting the left ICA origin and bulb. Subsequent stenosis up to 65 % with respect to the distal vessel (series 14, image 171) at the level of the proximal bulb. Tortuous left ICA distal to the bulb with a kinked appearance at the C2 level (series 10, image 28).  Otherwise negative cervical left ICA.  Vertebral arteries:No proximal subclavian artery stenosis. Both vertebral artery origins are within normal limits; the left is mildly obscured by paravertebral venous contrast reflux. Fairly codominant vertebral arteries are patent and negative to the skullbase.  CTA HEAD  Posterior circulation: Codominant distal vertebral arteries, the left functionally terminates in PICA. Diminutive or absent right PICA. The basilar artery is patent without stenosis but diminutive. Normal SCA origins. Fetal type bilateral PCA origins with tortuous posterior communicating arteries. Bilateral PCA branches are within normal limits.  Anterior circulation: Right ICA siphon is patent without stenosis and there is only mild supra clinoid calcified plaque. Right ophthalmic and posterior communicating artery origins are normal.  Left ICA siphon is patent with mild to moderate cavernous segment and supraclinoid calcified plaque. No significant left ICA siphon stenosis. Also, the left ophthalmic artery appears to have an unusually proximal origin, in the distal cavernous segment proximal to the left anterior genu (see series 11, image 152 and series 7, image 232) but remains patent.  Normal left posterior communicating artery origin. Patent carotid termini. Normal MCA and ACA origins.  Anterior communicating artery and bilateral ACA branches are within normal limits. Left MCA M1 segment, bifurcation, and left MCA branches are within normal limits. Right MCA M1 segment, bifurcation, and right MCA branches are within normal limits.  Venous sinuses: Patent.  Anatomic variants: Unusually proximal origin of the left ophthalmic artery. Fetal type bilateral PCA origins.  Delayed phase: No abnormal enhancement identified.  IMPRESSION: 1. Left greater than right carotid bifurcation atherosclerosis in the neck. Proximal left ICA stenosis up to 65%. Mild for age left greater than right ICA siphon calcified plaque.  2. No other hemodynamically significant stenosis in the head or neck.  No emergent large vessel occlusion. 3. Patent left ophthalmic artery, within unusually proximal origin from the left ICA siphon (normal anatomic variation). 4. Stable, negative for age CT appearance of the head.   Electronically Signed   By: Genevie Ann M.D.   On: 09/29/2015 16:10   Dg Chest 2 View  09/28/2015   CLINICAL DATA:  Pt c/o of left eye pain/pressure since last night, headache; non smoker  EXAM: CHEST - 2 VIEW  COMPARISON:  04/02/2013  FINDINGS: Lungs are clear. Heart size upper limits normal. The mediastinal contours are within normal limits. No pneumothorax. No effusion. Visualized skeletal structures are unremarkable.  IMPRESSION: Borderline cardiomegaly.  Otherwise negative.   Electronically Signed   By: Lucrezia Europe M.D.   On: 09/28/2015 11:52   Ct Head Wo Contrast  09/28/2015   CLINICAL DATA:  Pt was seen at eye dr on Fri for left eye pressure/pain, Dr stated she had a few small bleeds in her left eye. Around midnight, pt states her head began hurting, and her visual field in her left eye decreased. Pt appears anxious  EXAM: CT HEAD WITHOUT CONTRAST  TECHNIQUE: Contiguous axial images were obtained from the base of the skull through the vertex without intravenous contrast.  COMPARISON:  04/02/2013  FINDINGS: Atherosclerotic and physiologic intracranial calcifications. Early mineralization in bilateral basal ganglia as before. There is no evidence of acute intracranial hemorrhage, brain edema, mass lesion, acute infarction, mass effect, or midline shift. Acute infarct may be inapparent on noncontrast CT. No other intra-axial abnormalities are seen, and the ventricles and sulci are within normal limits in size and symmetry. No abnormal extra-axial fluid collections or masses are identified. No significant calvarial abnormality.  IMPRESSION: 1. Negative for  bleed or other acute intracranial process.   Electronically Signed   By: Lucrezia Europe M.D.   On: 09/28/2015 11:51   Ct Angio Neck W/cm &/or Wo/cm  09/29/2015   CLINICAL DATA:  69 year old female with left face weakness and left side vision loss. Central retinal artery occlusion suspected. Initial encounter.  EXAM: CT ANGIOGRAPHY HEAD AND NECK  TECHNIQUE: Multidetector CT imaging of the head and neck was performed using the standard protocol during bolus administration of intravenous contrast. Multiplanar CT image reconstructions and MIPs were obtained to evaluate the vascular anatomy. Carotid stenosis measurements (when applicable) are obtained utilizing NASCET criteria, using the distal internal carotid diameter as the denominator.  CONTRAST:  161mL OMNIPAQUE IOHEXOL 350 MG/ML SOLN  COMPARISON:  Brain MRI and Head CT without contrast 09/28/2015.  FINDINGS: CT HEAD  Brain: Stable gray-white matter differentiation throughout the brain. No midline shift, mass effect, or evidence of intracranial mass lesion. No ventriculomegaly. No acute intracranial hemorrhage identified. Cerebral volume is normal for age.  Calvarium and skull base:  No acute osseous abnormality identified.  Paranasal sinuses: Stable bubbly opacity in the sphenoid sinuses. Mild left maxillary sinus mucosal thickening.  Orbits: Prior cataract surgery changes to both globes, otherwise negative.  CTA NECK  Skeleton: Absent dentition. Degenerative changes in the cervical spine. No acute osseous abnormality identified.  Other neck: Mosaic attenuation in the visualized upper lobes. No superior mediastinal lymphadenopathy.  Thyroid, larynx, pharynx, parapharyngeal spaces, retropharyngeal space, sublingual space, submandibular glands, and parotid glands are within normal limits. No cervical lymphadenopathy.  Aortic arch: 3 vessel arch configuration. Intermittent soft and calcified arch atherosclerosis. No great vessel origin stenosis.  Right carotid system: Streak artifact from dense left subclavian vein contrast mildly obscures the  right CCA  origin. Otherwise negative right CCA proximal to the carotid bifurcation. At the bifurcation there is soft and calcified plaque affecting the right ICA origin and bulb, but subsequent stenosis is less than 50 % with respect to the distal vessel. Mildly tortuous but otherwise negative cervical right ICA.  Left carotid system: Negative left CCA proximal to the carotid bifurcation. At the left carotid bifurcation there is calcified plaque affecting the left ICA origin and bulb. Subsequent stenosis up to 65 % with respect to the distal vessel (series 14, image 171) at the level of the proximal bulb. Tortuous left ICA distal to the bulb with a kinked appearance at the C2 level (series 10, image 28). Otherwise negative cervical left ICA.  Vertebral arteries:No proximal subclavian artery stenosis. Both vertebral artery origins are within normal limits; the left is mildly obscured by paravertebral venous contrast reflux. Fairly codominant vertebral arteries are patent and negative to the skullbase.  CTA HEAD  Posterior circulation: Codominant distal vertebral arteries, the left functionally terminates in PICA. Diminutive or absent right PICA. The basilar artery is patent without stenosis but diminutive. Normal SCA origins. Fetal type bilateral PCA origins with tortuous posterior communicating arteries. Bilateral PCA branches are within normal limits.  Anterior circulation: Right ICA siphon is patent without stenosis and there is only mild supra clinoid calcified plaque. Right ophthalmic and posterior communicating artery origins are normal.  Left ICA siphon is patent with mild to moderate cavernous segment and supraclinoid calcified plaque. No significant left ICA siphon stenosis. Also, the left ophthalmic artery appears to have an unusually proximal origin, in the distal cavernous segment proximal to the left anterior genu (see series 11, image 152 and series 7, image 232) but remains patent.  Normal left  posterior communicating artery origin. Patent carotid termini. Normal MCA and ACA origins.  Anterior communicating artery and bilateral ACA branches are within normal limits. Left MCA M1 segment, bifurcation, and left MCA branches are within normal limits. Right MCA M1 segment, bifurcation, and right MCA branches are within normal limits.  Venous sinuses: Patent.  Anatomic variants: Unusually proximal origin of the left ophthalmic artery. Fetal type bilateral PCA origins.  Delayed phase: No abnormal enhancement identified.  IMPRESSION: 1. Left greater than right carotid bifurcation atherosclerosis in the neck. Proximal left ICA stenosis up to 65%. Mild for age left greater than right ICA siphon calcified plaque. 2. No other hemodynamically significant stenosis in the head or neck. No emergent large vessel occlusion. 3. Patent left ophthalmic artery, within unusually proximal origin from the left ICA siphon (normal anatomic variation). 4. Stable, negative for age CT appearance of the head.   Electronically Signed   By: Genevie Ann M.D.   On: 09/29/2015 16:10   Mr Brain Wo Contrast  09/28/2015   CLINICAL DATA:  Stroke symptoms. Vision loss LEFT eye. LEFT facial weakness.  EXAM: MRI HEAD WITHOUT CONTRAST  TECHNIQUE: Multiplanar, multiecho pulse sequences of the brain and surrounding structures were obtained without intravenous contrast.  COMPARISON:  None.  FINDINGS: No evidence for acute infarction, hemorrhage, mass lesion, hydrocephalus, or extra-axial fluid. Mild atrophy. Mild subcortical and periventricular T2 and FLAIR hyperintensities, likely chronic microvascular ischemic change. Flow voids are maintained throughout the carotid, basilar, and vertebral arteries. There are no areas of chronic hemorrhage. Pituitary, pineal, and cerebellar tonsils unremarkable. No upper cervical lesions. Visualized calvarium, skull base, and upper cervical osseous structures unremarkable. Scalp and extracranial soft tissues, orbits,  sinuses, and mastoids show no acute process.  IMPRESSION: Chronic changes  as described.  No acute stroke is evident.  No cause seen patient's symptoms.   Electronically Signed   By: Staci Righter M.D.   On: 09/28/2015 17:35   US Carotid Bilateral  09/29/2015   CLINICAL DATA:  Stroke.  EXAM: BILATERAL CAROTID DUPLEX ULTRASOUND  TECHNIQUE: Pearline Cables scale imaging, color Doppler and duplex ultrasound were performed of bilateral carotid and vertebral arteries in the neck.  COMPARISON:  None.  FINDINGS: Criteria: Quantification of carotid stenosis is based on velocity parameters that correlate the residual internal carotid diameter with NASCET-based stenosis levels, using the diameter of the distal internal carotid lumen as the denominator for stenosis measurement.  The following velocity measurements were obtained:  RIGHT  ICA:  95/25 cm/sec  CCA:  33/00 cm/sec  SYSTOLIC ICA/CCA RATIO:  1.2  DIASTOLIC ICA/CCA RATIO:  1.3  ECA:  61 cm/sec  LEFT  ICA:  104/28 cm/sec  CCA:  76/22 cm/sec  SYSTOLIC ICA/CCA RATIO:  1.2  DIASTOLIC ICA/CCA RATIO:  1.6  ECA:  61 cm/sec  RIGHT CAROTID ARTERY: Mild right carotid bifurcation plaque noted. No flow limiting stenosis.  RIGHT VERTEBRAL ARTERY:  Patent with antegrade flow.  LEFT CAROTID ARTERY: Mild left carotid bifurcation plaque. No flow limiting stenosis.  LEFT VERTEBRAL ARTERY:  Patent antegrade flow.  IMPRESSION: 1. Mild bilateral carotid bifurcation plaque. No flow limiting stenosis. Degree of stenosis less than 50%. 2. Vertebral arteries are patent with antegrade flow .   Electronically Signed   By: Marcello Moores  Register   On: 09/29/2015 10:30     ASSESSMENT AND PLAN:   69 year old female with a history of diabetes, peptic ulcer disease and aortic stenosis who presented with loss of vision in her left eye.  1. Central artery retinal occlusion: Patient saw the ophthalmologist prior to admission and was ruled out for retinal detachment. MRI negative for acute stroke. CT showing  65% blockage on left carotid artery. Plan for vascular surgery to see patient and make recommendations..  Continue aspirin and Plavix. She'll need to follow-up with ophthalmology within 1 week's time.   2. Type 2 diabetes without complication: Patient continue her outpatient regimen of 70/30 insulin.  3. Hyperlipidemia: Patient's LDL is 131 which is not at goal. She was started on low-dose statin. She is intolerant to high-dose statins.  4. History of aortic stenosis: Patient follows with Dr. Ubaldo Glassing 5. History of peptic ulcer disease: Patient has had no bleeding since she's been off of Plavix. We did review side effects, alternatives and benefits of Plavix and other blood thinners.   Management plans discussed with the patient and she is in agreement.  CODE STATUS: FULL  TOTAL TIME TAKING CARE OF THIS PATIENT: 78minutes.     POSSIBLE D/C 1-2 days, DEPENDING ON CLINICAL CONDITION.   Brieanne Mignone M.D on 09/29/2015 at 10:29 AM  Between 7am to 6pm - Pager - [phone] After 6pm go to [email or web address] - password [email or web address] [facility]  Office  [phone]  CC: Primary care physician; [place] Pink, MD  Note: This dictation was prepared with Dragon dictation along with smaller phrase technology. Any transcriptional errors that result from this process are unintentional.

## 2015-09-30 NOTE — Progress Notes (Signed)
[facility] [place] NAME: Joyce Potter    MR#:  [id number]  DATE OF BIRTH:  [birth year]  SUBJECTIVE:  Patient still with visual loss of diet  REVIEW OF SYSTEMS:    Review of Systems  Constitutional: Negative for fever, chills and malaise/fatigue.  HENT: Negative for sore throat.        Left-sided visual loss and central vision not peripheral  Eyes: Negative for blurred vision.  Respiratory: Negative for cough, hemoptysis, shortness of breath and wheezing.   Cardiovascular: Negative for chest pain, palpitations and leg swelling.  Gastrointestinal: Negative for nausea, vomiting, abdominal pain, diarrhea and blood in stool.  Genitourinary: Negative for dysuria.  Musculoskeletal: Negative for back pain.  Neurological: Negative for dizziness, tremors and headaches.  Endo/Heme/Allergies: Does not bruise/bleed easily.    Tolerating Diet: Yes      DRUG ALLERGIES:   Allergies  Allergen Reactions  . Aspirin Other (See Comments)    ulcers  . Invokamet [Canagliflozin-Metformin Hcl] Other (See Comments)    Yeast infection  . Sulfa Antibiotics Other (See Comments)    "Got Drunk"  . Lovastatin Rash  . Penicillins Rash  . Vicodin [Hydrocodone-Acetaminophen] Rash    VITALS:  Blood pressure 126/52, pulse 62, temperature 97.5 F (36.4 C), temperature source Oral, resp. rate 18, height 5\' 4"  (1.626 m), weight 92.987 kg (205 lb), SpO2 94 %.  PHYSICAL EXAMINATION:   Physical Exam    LABORATORY PANEL:   CBC  Recent Labs Lab 09/29/15 0413  WBC 4.5  HGB 9.8*  HCT 30.5*  PLT 245   ------------------------------------------------------------------------------------------------------------------  Chemistries   Recent Labs Lab 09/29/15 0413  NA 140  K 4.0  CL 106  CO2 27  GLUCOSE 149*  BUN 15  CREATININE 0.89  CALCIUM 8.8*    ------------------------------------------------------------------------------------------------------------------  Cardiac Enzymes  Recent Labs Lab 09/28/15 1128  TROPONINI <0.03   ------------------------------------------------------------------------------------------------------------------  RADIOLOGY:  Ct Angio Head W/cm &/or Wo Cm  09/29/2015   CLINICAL DATA:  69 year old female with left face weakness and left side vision loss. Central retinal artery occlusion suspected. Initial encounter.  EXAM: CT ANGIOGRAPHY HEAD AND NECK  TECHNIQUE: Multidetector CT imaging of the head and neck was performed using the standard protocol during bolus administration of intravenous contrast. Multiplanar CT image reconstructions and MIPs were obtained to evaluate the vascular anatomy. Carotid stenosis measurements (when applicable) are obtained utilizing NASCET criteria, using the distal internal carotid diameter as the denominator.  CONTRAST:  132mL OMNIPAQUE IOHEXOL 350 MG/ML SOLN  COMPARISON:  Brain MRI and Head CT without contrast 09/28/2015.  FINDINGS: CT HEAD  Brain: Stable gray-white matter differentiation throughout the brain. No midline shift, mass effect, or evidence of intracranial mass lesion. No ventriculomegaly. No acute intracranial hemorrhage identified. Cerebral volume is normal for age.  Calvarium and skull base:  No acute osseous abnormality identified.  Paranasal sinuses: Stable bubbly opacity in the sphenoid sinuses. Mild left maxillary sinus mucosal thickening.  Orbits: Prior cataract surgery changes to both globes, otherwise negative.  CTA NECK  Skeleton: Absent dentition. Degenerative changes in the cervical spine. No acute osseous abnormality identified.  Other neck: Mosaic attenuation in the visualized upper lobes. No superior mediastinal lymphadenopathy.  Thyroid, larynx, pharynx, parapharyngeal spaces, retropharyngeal space, sublingual space, submandibular glands, and parotid  glands are within normal limits. No cervical lymphadenopathy.  Aortic arch: 3 vessel arch configuration. Intermittent soft and calcified arch atherosclerosis. No great vessel origin stenosis.  Right carotid system: Streak  artifact from dense left subclavian vein contrast mildly obscures the right CCA origin. Otherwise negative right CCA proximal to the carotid bifurcation. At the bifurcation there is soft and calcified plaque affecting the right ICA origin and bulb, but subsequent stenosis is less than 50 % with respect to the distal vessel. Mildly tortuous but otherwise negative cervical right ICA.  Left carotid system: Negative left CCA proximal to the carotid bifurcation. At the left carotid bifurcation there is calcified plaque affecting the left ICA origin and bulb. Subsequent stenosis up to 65 % with respect to the distal vessel (series 14, image 171) at the level of the proximal bulb. Tortuous left ICA distal to the bulb with a kinked appearance at the C2 level (series 10, image 28). Otherwise negative cervical left ICA.  Vertebral arteries:No proximal subclavian artery stenosis. Both vertebral artery origins are within normal limits; the left is mildly obscured by paravertebral venous contrast reflux. Fairly codominant vertebral arteries are patent and negative to the skullbase.  CTA HEAD  Posterior circulation: Codominant distal vertebral arteries, the left functionally terminates in PICA. Diminutive or absent right PICA. The basilar artery is patent without stenosis but diminutive. Normal SCA origins. Fetal type bilateral PCA origins with tortuous posterior communicating arteries. Bilateral PCA branches are within normal limits.  Anterior circulation: Right ICA siphon is patent without stenosis and there is only mild supra clinoid calcified plaque. Right ophthalmic and posterior communicating artery origins are normal.  Left ICA siphon is patent with mild to moderate cavernous segment and supraclinoid  calcified plaque. No significant left ICA siphon stenosis. Also, the left ophthalmic artery appears to have an unusually proximal origin, in the distal cavernous segment proximal to the left anterior genu (see series 11, image 152 and series 7, image 232) but remains patent.  Normal left posterior communicating artery origin. Patent carotid termini. Normal MCA and ACA origins.  Anterior communicating artery and bilateral ACA branches are within normal limits. Left MCA M1 segment, bifurcation, and left MCA branches are within normal limits. Right MCA M1 segment, bifurcation, and right MCA branches are within normal limits.  Venous sinuses: Patent.  Anatomic variants: Unusually proximal origin of the left ophthalmic artery. Fetal type bilateral PCA origins.  Delayed phase: No abnormal enhancement identified.  IMPRESSION: 1. Left greater than right carotid bifurcation atherosclerosis in the neck. Proximal left ICA stenosis up to 65%. Mild for age left greater than right ICA siphon calcified plaque. 2. No other hemodynamically significant stenosis in the head or neck. No emergent large vessel occlusion. 3. Patent left ophthalmic artery, within unusually proximal origin from the left ICA siphon (normal anatomic variation). 4. Stable, negative for age CT appearance of the head.   Electronically Signed   By: Genevie Ann M.D.   On: 09/29/2015 16:10   Dg Chest 2 View  09/28/2015   CLINICAL DATA:  Pt c/o of left eye pain/pressure since last night, headache; non smoker  EXAM: CHEST - 2 VIEW  COMPARISON:  04/02/2013  FINDINGS: Lungs are clear. Heart size upper limits normal. The mediastinal contours are within normal limits. No pneumothorax. No effusion. Visualized skeletal structures are unremarkable.  IMPRESSION: Borderline cardiomegaly.  Otherwise negative.   Electronically Signed   By: Lucrezia Europe M.D.   On: 09/28/2015 11:52   Ct Head Wo Contrast  09/28/2015   CLINICAL DATA:  Pt was seen at eye dr on Fri for left eye  pressure/pain, Dr stated she had a few small bleeds in her left eye.  Around midnight, pt states her head began hurting, and her visual field in her left eye decreased. Pt appears anxious  EXAM: CT HEAD WITHOUT CONTRAST  TECHNIQUE: Contiguous axial images were obtained from the base of the skull through the vertex without intravenous contrast.  COMPARISON:  04/02/2013  FINDINGS: Atherosclerotic and physiologic intracranial calcifications. Early mineralization in bilateral basal ganglia as before. There is no evidence of acute intracranial hemorrhage, brain edema, mass lesion, acute infarction, mass effect, or midline shift. Acute infarct may be inapparent on noncontrast CT. No other intra-axial abnormalities are seen, and the ventricles and sulci are within normal limits in size and symmetry. No abnormal extra-axial fluid collections or masses are identified. No significant calvarial abnormality.  IMPRESSION: 1. Negative for  bleed or other acute intracranial process.   Electronically Signed   By: Lucrezia Europe M.D.   On: 09/28/2015 11:51   Ct Angio Neck W/cm &/or Wo/cm  09/29/2015   CLINICAL DATA:  69 year old female with left face weakness and left side vision loss. Central retinal artery occlusion suspected. Initial encounter.  EXAM: CT ANGIOGRAPHY HEAD AND NECK  TECHNIQUE: Multidetector CT imaging of the head and neck was performed using the standard protocol during bolus administration of intravenous contrast. Multiplanar CT image reconstructions and MIPs were obtained to evaluate the vascular anatomy. Carotid stenosis measurements (when applicable) are obtained utilizing NASCET criteria, using the distal internal carotid diameter as the denominator.  CONTRAST:  143mL OMNIPAQUE IOHEXOL 350 MG/ML SOLN  COMPARISON:  Brain MRI and Head CT without contrast 09/28/2015.  FINDINGS: CT HEAD  Brain: Stable gray-white matter differentiation throughout the brain. No midline shift, mass effect, or evidence of intracranial  mass lesion. No ventriculomegaly. No acute intracranial hemorrhage identified. Cerebral volume is normal for age.  Calvarium and skull base:  No acute osseous abnormality identified.  Paranasal sinuses: Stable bubbly opacity in the sphenoid sinuses. Mild left maxillary sinus mucosal thickening.  Orbits: Prior cataract surgery changes to both globes, otherwise negative.  CTA NECK  Skeleton: Absent dentition. Degenerative changes in the cervical spine. No acute osseous abnormality identified.  Other neck: Mosaic attenuation in the visualized upper lobes. No superior mediastinal lymphadenopathy.  Thyroid, larynx, pharynx, parapharyngeal spaces, retropharyngeal space, sublingual space, submandibular glands, and parotid glands are within normal limits. No cervical lymphadenopathy.  Aortic arch: 3 vessel arch configuration. Intermittent soft and calcified arch atherosclerosis. No great vessel origin stenosis.  Right carotid system: Streak artifact from dense left subclavian vein contrast mildly obscures the right CCA origin. Otherwise negative right CCA proximal to the carotid bifurcation. At the bifurcation there is soft and calcified plaque affecting the right ICA origin and bulb, but subsequent stenosis is less than 50 % with respect to the distal vessel. Mildly tortuous but otherwise negative cervical right ICA.  Left carotid system: Negative left CCA proximal to the carotid bifurcation. At the left carotid bifurcation there is calcified plaque affecting the left ICA origin and bulb. Subsequent stenosis up to 65 % with respect to the distal vessel (series 14, image 171) at the level of the proximal bulb. Tortuous left ICA distal to the bulb with a kinked appearance at the C2 level (series 10, image 28). Otherwise negative cervical left ICA.  Vertebral arteries:No proximal subclavian artery stenosis. Both vertebral artery origins are within normal limits; the left is mildly obscured by paravertebral venous contrast  reflux. Fairly codominant vertebral arteries are patent and negative to the skullbase.  CTA HEAD  Posterior circulation: Codominant distal vertebral arteries,  the left functionally terminates in PICA. Diminutive or absent right PICA. The basilar artery is patent without stenosis but diminutive. Normal SCA origins. Fetal type bilateral PCA origins with tortuous posterior communicating arteries. Bilateral PCA branches are within normal limits.  Anterior circulation: Right ICA siphon is patent without stenosis and there is only mild supra clinoid calcified plaque. Right ophthalmic and posterior communicating artery origins are normal.  Left ICA siphon is patent with mild to moderate cavernous segment and supraclinoid calcified plaque. No significant left ICA siphon stenosis. Also, the left ophthalmic artery appears to have an unusually proximal origin, in the distal cavernous segment proximal to the left anterior genu (see series 11, image 152 and series 7, image 232) but remains patent.  Normal left posterior communicating artery origin. Patent carotid termini. Normal MCA and ACA origins.  Anterior communicating artery and bilateral ACA branches are within normal limits. Left MCA M1 segment, bifurcation, and left MCA branches are within normal limits. Right MCA M1 segment, bifurcation, and right MCA branches are within normal limits.  Venous sinuses: Patent.  Anatomic variants: Unusually proximal origin of the left ophthalmic artery. Fetal type bilateral PCA origins.  Delayed phase: No abnormal enhancement identified.  IMPRESSION: 1. Left greater than right carotid bifurcation atherosclerosis in the neck. Proximal left ICA stenosis up to 65%. Mild for age left greater than right ICA siphon calcified plaque. 2. No other hemodynamically significant stenosis in the head or neck. No emergent large vessel occlusion. 3. Patent left ophthalmic artery, within unusually proximal origin from the left ICA siphon (normal anatomic  variation). 4. Stable, negative for age CT appearance of the head.   Electronically Signed   By: Genevie Ann M.D.   On: 09/29/2015 16:10   Mr Brain Wo Contrast  09/28/2015   CLINICAL DATA:  Stroke symptoms. Vision loss LEFT eye. LEFT facial weakness.  EXAM: MRI HEAD WITHOUT CONTRAST  TECHNIQUE: Multiplanar, multiecho pulse sequences of the brain and surrounding structures were obtained without intravenous contrast.  COMPARISON:  None.  FINDINGS: No evidence for acute infarction, hemorrhage, mass lesion, hydrocephalus, or extra-axial fluid. Mild atrophy. Mild subcortical and periventricular T2 and FLAIR hyperintensities, likely chronic microvascular ischemic change. Flow voids are maintained throughout the carotid, basilar, and vertebral arteries. There are no areas of chronic hemorrhage. Pituitary, pineal, and cerebellar tonsils unremarkable. No upper cervical lesions. Visualized calvarium, skull base, and upper cervical osseous structures unremarkable. Scalp and extracranial soft tissues, orbits, sinuses, and mastoids show no acute process.  IMPRESSION: Chronic changes as described.  No acute stroke is evident.  No cause seen patient's symptoms.   Electronically Signed   By: Staci Righter M.D.   On: 09/28/2015 17:35   US Carotid Bilateral  09/29/2015   CLINICAL DATA:  Stroke.  EXAM: BILATERAL CAROTID DUPLEX ULTRASOUND  TECHNIQUE: Pearline Cables scale imaging, color Doppler and duplex ultrasound were performed of bilateral carotid and vertebral arteries in the neck.  COMPARISON:  None.  FINDINGS: Criteria: Quantification of carotid stenosis is based on velocity parameters that correlate the residual internal carotid diameter with NASCET-based stenosis levels, using the diameter of the distal internal carotid lumen as the denominator for stenosis measurement.  The following velocity measurements were obtained:  RIGHT  ICA:  95/25 cm/sec  CCA:  70/62 cm/sec  SYSTOLIC ICA/CCA RATIO:  1.2  DIASTOLIC ICA/CCA RATIO:  1.3  ECA:   61 cm/sec  LEFT  ICA:  104/28 cm/sec  CCA:  37/62 cm/sec  SYSTOLIC ICA/CCA RATIO:  1.2  DIASTOLIC ICA/CCA  RATIO:  1.6  ECA:  61 cm/sec  RIGHT CAROTID ARTERY: Mild right carotid bifurcation plaque noted. No flow limiting stenosis.  RIGHT VERTEBRAL ARTERY:  Patent with antegrade flow.  LEFT CAROTID ARTERY: Mild left carotid bifurcation plaque. No flow limiting stenosis.  LEFT VERTEBRAL ARTERY:  Patent antegrade flow.  IMPRESSION: 1. Mild bilateral carotid bifurcation plaque. No flow limiting stenosis. Degree of stenosis less than 50%. 2. Vertebral arteries are patent with antegrade flow .   Electronically Signed   By: Marcello Moores  Register   On: 09/29/2015 10:30     ASSESSMENT AND PLAN:   69 year old female with a history of diabetes, peptic ulcer disease and aortic stenosis who presented with loss of vision in her left eye.  1. Central artery retinal occlusion: Patient saw the ophthalmologist prior to admission and was ruled out for retinal detachment. She underwent stroke workup including carotid Doppler, MRI and 2-D echocardiogram. Neurology was also consulted. The neurologist recommended CTA.  Patient was started on Plavix and statin medication. She has had history of GI bleed on Plavix and aspirin, however she was willing to try Plavix rather than Aggrenox. . She'll need to follow-up with ophthalmology within 1 week's time. She was told that she cannot drive at this time.  2. Type 2 diabetes without complication: Patient continue her outpatient regimen of 70/30 insulin.  3. Hyperlipidemia: Patient's LDL is 131 which is not at goal. She was started on low-dose statin. She is intolerant to high-dose statins.  4. History of aortic stenosis: Patient follows with Dr. Satira Mccallum.  5. History of peptic ulcer disease: Patient has had no bleeding since she's been off of Plavix. We did review side effects, alternatives and benefits of Plavix and other blood thinners.   Management plans discussed with the patient  and she is in agreement.  CODE STATUS: FULL  TOTAL TIME TAKING CARE OF THIS PATIENT: 35 minutes.     POSSIBLE D/C 1-2 days, DEPENDING ON CLINICAL CONDITION.   Alek Borges M.D on 09/29/2015 at 10:27 AM  Between 7am to 6pm - Pager - [phone] After 6pm go to [email or web address] - password [email or web address] [facility]  Office  [phone]  CC: Primary care physician; [place] Pink, MD  Note: This dictation was prepared with Dragon dictation along with smaller phrase technology. Any transcriptional errors that result from this process are unintentional.

## 2015-09-30 NOTE — Progress Notes (Signed)
Inpatient Diabetes Program Recommendations  AACE/ADA: New Consensus Statement on Inpatient Glycemic Control (2015)  Target Ranges:  Prepandial:   less than 140 mg/dL      Peak postprandial:   less than 180 mg/dL (1-2 hours)      Critically ill patients:  140 - 180 mg/dL   Review of Glycemic Control:  Results for TEDDIE, CURD (MRN [id number]) as of 09/30/2015 10:59  Ref. Range 09/29/2015 11:20 09/29/2015 16:17 09/29/2015 20:31 09/29/2015 21:57 09/30/2015 07:42  Glucose-Capillary Latest Ref Range: 65-99 mg/dL 236 (H) 107 (H) 53 (L) 127 (H) 107 (H)    Diabetes history: Insulin Dependent Diabetes Mellitus Outpatient Diabetes medications: Novolin 70/30 48 units q AM, and 28 units q PM Current orders for Inpatient glycemic control: Novolog 70/30 48 units q AM and 28 units q PM  May consider reducing PM dose of 70/30 to 24 units with supper.   Thanks, Adah Perl, RN, BC-ADM Inpatient Diabetes Coordinator Pager [phone] (8a-5p)

## 2015-09-30 NOTE — Progress Notes (Signed)
NEUROLOGY NOTE  S: Pt feels fine and happy about results, vision unchanged  ROS neg x 8 systems except per HPI  O: 97.5    126/52    62   18 No distress, nl weight Normocephalic, oropharynx clear Supple, no JVD CTA B, no wheezing tachycardic, no murmur No C/C/E  A+Ox3, nl speech and language PERRLA, EOMI, face symmetric 5/5 B, nl tone  CTA personally reviewed by me and shows L 65% stenosis of ICA  A/P: 1.  L carotid stenosis-  Symptomatic 2.  L central retinal artery occlusion-  Stable, from 1. -  Vascular surgery taking to surgery tomorrow -  Continue Plavix and statin -  Will follow

## 2015-09-30 NOTE — Progress Notes (Signed)
   09/30/15 1435  Clinical Encounter Type  Visited With Patient and family together  Visit Type Initial  Provided pastoral presence and support to patient and family in unit.  [place] [phone]

## 2015-09-30 NOTE — Plan of Care (Signed)
Problem: Discharge/Transitional Outcomes Goal: Other Discharge Outcomes/Goals Outcome: Progressing Plan of care progress to goals: 1. No c/o pain. Resting comfortably w/ family at bedside  2. Hemodynamically:             -VSS, afebrile              -NIH 2 (L sided vision problem remains)             -Vascular surgery consult pending

## 2015-09-30 NOTE — Consult Note (Signed)
[facility] Vascular Consult Note  MRN : [id number]  ASUSENA SIGLEY is a 69 y.o. (May 02, 1946) female who presents with chief complaint of  Chief Complaint  Patient presents with  . Eye Problem  .  History of Present Illness: presents to [facility] after having L vision loss. She saw an optometrist before admission who stated that she had a central retinal artery occlusion. Pt denies jaw pain, headache, fever or chills. This has never happened before. Pt has not had any changes since being here.  Current Facility-Administered Medications  Medication Dose Route Frequency Provider Last Rate Last Dose  . 0.9 %  sodium chloride infusion   Intravenous Continuous Katha Cabal, MD      . acetaminophen (TYLENOL) tablet 650 mg  650 mg Oral Q6H PRN Loletha Grayer, MD       Or  . acetaminophen (TYLENOL) suppository 650 mg  650 mg Rectal Q6H PRN Loletha Grayer, MD      . clopidogrel (PLAVIX) tablet 75 mg  75 mg Oral Daily Bettey Costa, MD   75 mg at 09/30/15 0910  . ferrous sulfate tablet 325 mg  325 mg Oral Q breakfast Loletha Grayer, MD   325 mg at 09/30/15 0910  . insulin aspart (novoLOG) injection 0-5 Units  0-5 Units Subcutaneous QHS Loletha Grayer, MD   0 Units at 09/28/15 2102  . insulin aspart (novoLOG) injection 0-9 Units  0-9 Units Subcutaneous TID WC Loletha Grayer, MD   3 Units at 09/29/15 1334  . insulin aspart protamine- aspart (NOVOLOG MIX 70/30) injection 24 Units  24 Units Subcutaneous Q supper Bettey Costa, MD   24 Units at 09/30/15 1700  . insulin aspart protamine- aspart (NOVOLOG MIX 70/30) injection 48 Units  48 Units Subcutaneous Q breakfast Loletha Grayer, MD   48 Units at 09/30/15 0910  . metoprolol (LOPRESSOR) tablet 100 mg  100 mg Oral BID Loletha Grayer, MD   100 mg at 09/30/15 0909  . multivitamin with minerals tablet 1 tablet  1 tablet Oral Daily Loletha Grayer, MD   1 tablet at 09/30/15 0910  . pantoprazole (PROTONIX) EC tablet 40 mg  40 mg  Oral QAC breakfast Vira Blanco, RPH   40 mg at 09/30/15 0910  . pravastatin (PRAVACHOL) tablet 10 mg  10 mg Oral q1800 Loletha Grayer, MD   10 mg at 09/30/15 1928  . sodium chloride 0.9 % injection 3 mL  3 mL Intravenous Q12H Loletha Grayer, MD   3 mL at 09/29/15 2103  . sodium chloride 0.9 % injection 3 mL  3 mL Intravenous Q12H Sital Mody, MD      . sodium chloride 0.9 % injection 3 mL  3 mL Intravenous PRN Bettey Costa, MD   3 mL at 09/30/15 1919  . [START ON 10/01/2015] vancomycin (VANCOCIN) IVPB 1000 mg/200 mL premix  1,000 mg Intravenous To OR Katha Cabal, MD      . vitamin C (ASCORBIC ACID) tablet 500 mg  500 mg Oral Daily Loletha Grayer, MD   500 mg at 09/30/15 3299    Past Medical History  Diagnosis Date  . IDDM (insulin dependent diabetes mellitus) ([place])   . High cholesterol   . HTN (hypertension)   . Multiple thyroid nodules     Benign  . CAD (coronary artery disease)   . Cataract   . Peptic ulcer disease   . Aortic stenosis   . Hyperlipidemia   . Basal cell carcinoma   .  Anemia     Past Surgical History  Procedure Laterality Date  . Coronary stent placement    . Partial hysterectomy    . Givens capsule study N/A 04/17/2013    Procedure: GIVENS CAPSULE STUDY;  Surgeon: Arta Silence, MD;  Location: [facility] ENDOSCOPY;  Service: Endoscopy;  Laterality: N/A;  patient ate breakfast at 7am   . Breast biopsy    . Coronary angioplasty    . Eye surgery    . Trigger finger release    . Cardiac catheterization N/A 08/08/2015    Procedure: Right and Left Heart Cath and Coronary Angiography;  Surgeon: Teodoro Spray, MD;  Location: [place] [facility];  Service: Cardiovascular;  Laterality: N/A;    Social History Social History  Substance Use Topics  . Smoking status: Former Research scientist (life sciences)  . Smokeless tobacco: None  . Alcohol Use: No    Family History Family History  Problem Relation Age of Onset  . Uterine cancer Mother   . Breast cancer Mother   . Colon cancer Neg Hx    . Seizures Father   . Stroke Father   . Diabetes Father   . COPD Father     Allergies  Allergen Reactions  . Aspirin Other (See Comments)    ulcers  . Invokamet [Canagliflozin-Metformin Hcl] Other (See Comments)    Yeast infection  . Sulfa Antibiotics Other (See Comments)    "Got Drunk"  . Lovastatin Rash  . Penicillins Rash  . Vicodin [Hydrocodone-Acetaminophen] Rash     REVIEW OF SYSTEMS (Negative unless checked)  Constitutional: [] Weight loss  [] Fever  [] Chills Cardiac: [] Chest pain   [] Chest pressure   [] Palpitations   [] Shortness of breath when laying flat   [] Shortness of breath at rest   [] Shortness of breath with exertion. Vascular:  [] Pain in legs with walking   [] Pain in legs at rest   [] Pain in legs when laying flat   [] Claudication   [] Pain in feet when walking  [] Pain in feet at rest  [] Pain in feet when laying flat   [] History of DVT   [] Phlebitis   [] Swelling in legs   [] Varicose veins   [] Non-healing ulcers Pulmonary:   [] Uses home oxygen   [] Productive cough   [] Hemoptysis   [] Wheeze  [] COPD   [] Asthma Neurologic:  [] Dizziness  [] Blackouts   [] Seizures   [] History of stroke   [] History of TIA  [] Aphasia   [] Temporary blindness   [] Dysphagia   [] Weakness or numbness in arms   [] Weakness or numbness in legs Musculoskeletal:  [] Arthritis   [] Joint swelling   [] Joint pain   [] Low back pain Hematologic:  [] Easy bruising  [] Easy bleeding   [] Hypercoagulable state   [] Anemic  [] Hepatitis Gastrointestinal:  [] Blood in stool   [] Vomiting blood  [] Gastroesophageal reflux/heartburn   [] Difficulty swallowing. Genitourinary:  [] Chronic kidney disease   [] Difficult urination  [] Frequent urination  [] Burning with urination   [] Blood in urine Skin:  [] Rashes   [] Ulcers   [] Wounds Psychological:  [] History of anxiety   []  History of major depression.    Physical Examination  Filed Vitals:   09/29/15 1337 09/29/15 2155 09/30/15 0448 09/30/15 1554  BP: 120/53 111/51 126/52  115/57  Pulse: 61 58 62 58  Temp: 98.2 F (36.8 C) 97.7 F (36.5 C) 97.5 F (36.4 C) 98.4 F (36.9 C)  TempSrc: Oral Oral Oral Oral  Resp:  18 18 20   Height:      Weight:      SpO2:  97% 97% 94% 98%   Body mass index is 35.17 kg/(m^2).  Head: [place]/AT, No temporalis wasting. Prominent temp pulse not noted. Ear/Nose/Throat: Nares w/o erythema or drainage, oropharynx w/o obsrtuction, Mallampati score: Class II.  Eyes: PERRLA, Sclera nonicteric.  Neck: Supple, no nuchal rigidity.  No bruit or JVD.  Pulmonary:  Breath sounds equal bilaterally, no use of accessory muscles.  Cardiac: RRR, normal S1, S2, no Murmurs, rubs or gallops. Vascular: Left carotid bruit is noted strong pulsation is noted on the left as well feet are pink and warm with 2+ dorsalis pedis pulses bilaterally Gastrointestinal: soft, non-tender, non-distended.  Musculoskeletal: Moves all extremities.  No deformity or atrophy. No edema. Neurologic: CN 2-12 intact. Symmetrical.  Speech is fluent.  Psychiatric: Judgment intact, Mood & affect appropriate for pt's clinical situation. Dermatologic: No rashes or ulcers noted.  No cellulitis or open wounds. Lymph : No Cervical,  or Inguinal lymphadenopathy.      CBC Lab Results  Component Value Date   WBC 4.5 09/29/2015   HGB 9.8* 09/29/2015   HCT 30.5* 09/29/2015   MCV 83.6 09/29/2015   PLT 245 09/29/2015    BMET    Component Value Date/Time   NA 140 09/29/2015 0413   NA 143 05/04/2013 0614   K 4.0 09/29/2015 0413   K 3.8 05/04/2013 0614   CL 106 09/29/2015 0413   CL 110* 05/04/2013 0614   CO2 27 09/29/2015 0413   CO2 27 05/04/2013 0614   GLUCOSE 149* 09/29/2015 0413   GLUCOSE 130* 05/04/2013 0614   BUN 15 09/29/2015 0413   BUN 12 05/04/2013 0614   CREATININE 0.89 09/29/2015 0413   CREATININE 0.79 08/27/2014 1445   CALCIUM 8.8* 09/29/2015 0413   CALCIUM 8.2* 05/04/2013 0614   GFRNONAA >60 09/29/2015 0413   GFRNONAA >60 08/27/2014 1445   GFRAA >60  09/29/2015 0413   GFRAA >60 08/27/2014 1445   Estimated Creatinine Clearance: 67.8 mL/min (by C-G formula based on Cr of 0.89).  COAG Lab Results  Component Value Date   INR 1.0 05/03/2013   INR 1.0 04/02/2013   INR 0.9 01/09/2013    Radiology MRI of the brain is negative for acute infarct.  CT angiogram is reviewed personally by me and demonstrates a 60-70% diameter reduction of the left internal carotid artery densely calcified there is associated marked tortuosity just distal to the lesion. The arch and proximal common carotid is widely patent and free of any hemodynamically significant lesion.  Assessment/Plan 1. Central artery retinal occlusion: Patient saw the ophthalmologist prior to admission and was ruled out for retinal detachment. MRI negative for acute stroke. CT showing 65% blockage on left carotid artery. She'll need to follow-up with ophthalmology within 1 week's time. This represents an embolic event which is most likely secondary to her left carotid stenosis. By CT evaluation she has a lesion that is 65-70% diameter reduction and therefore given her symptoms should undergo correction. Her anatomy is very unfavorable for stenting and I have discussed this with her. Although she does have significant redundancy and her lesion is relatively high relative to C1 and C2 she does appear to be a surgical candidate as her optimal choice of therapy and this also was reviewed with the patient in great detail. I have explained the risks and benefits of carotid endarterectomy as well as the alternative therapies including medical therapy and stenting. She has elected to proceed with carotid endarterectomy. Total length of time in discussion with the patient regarding the  upcoming surgery and choices was 50 minutes.  2. Left carotid stenosis: Plan as per #1 above; she will be maintained on Plavix as well as statin therapy. She is allergic to aspirin and given her pending surgery dual  antiplatelet therapy is not indicated.  3. Hyperlipidemia: Patient's LDL is 131 which is not at goal. She was started on low-dose statin. She is intolerant to high-dose statins.  4. History of aortic stenosis: Patient follows with Dr. Ubaldo Glassing  5.  Type 2 diabetes without complication: Patient continue her outpatient regimen of 70/30 insulin.  6. History of peptic ulcer disease: Patient has had no bleeding since she's been off of Plavix. We did review side effects, alternatives and benefits of Plavix and other blood thinners.    Schnier, Dolores Lory, MD  09/30/2015 8:01 PM

## 2015-10-01 ENCOUNTER — Inpatient Hospital Stay: Payer: Commercial Managed Care - HMO | Admitting: Anesthesiology

## 2015-10-01 ENCOUNTER — Encounter: Payer: Self-pay | Admitting: Anesthesiology

## 2015-10-01 ENCOUNTER — Encounter
Admission: EM | Disposition: A | Discharge status: Home / Self Care | Payer: Self-pay | Source: Home / Self Care | Attending: Internal Medicine

## 2015-10-01 DIAGNOSIS — E119 Type 2 diabetes mellitus without complications: Secondary | ICD-10-CM | POA: Diagnosis present

## 2015-10-01 DIAGNOSIS — I350 Nonrheumatic aortic (valve) stenosis: Secondary | ICD-10-CM | POA: Diagnosis present

## 2015-10-01 DIAGNOSIS — I2510 Atherosclerotic heart disease of native coronary artery without angina pectoris: Secondary | ICD-10-CM | POA: Diagnosis present

## 2015-10-01 DIAGNOSIS — I63132 Cerebral infarction due to embolism of left carotid artery: Secondary | ICD-10-CM | POA: Diagnosis present

## 2015-10-01 DIAGNOSIS — Z823 Family history of stroke: Secondary | ICD-10-CM | POA: Diagnosis not present

## 2015-10-01 DIAGNOSIS — H269 Unspecified cataract: Secondary | ICD-10-CM | POA: Diagnosis present

## 2015-10-01 DIAGNOSIS — Z955 Presence of coronary angioplasty implant and graft: Secondary | ICD-10-CM | POA: Diagnosis not present

## 2015-10-01 DIAGNOSIS — Z7902 Long term (current) use of antithrombotics/antiplatelets: Secondary | ICD-10-CM | POA: Diagnosis not present

## 2015-10-01 DIAGNOSIS — Z803 Family history of malignant neoplasm of breast: Secondary | ICD-10-CM | POA: Diagnosis not present

## 2015-10-01 DIAGNOSIS — E041 Nontoxic single thyroid nodule: Secondary | ICD-10-CM | POA: Diagnosis present

## 2015-10-01 DIAGNOSIS — Z794 Long term (current) use of insulin: Secondary | ICD-10-CM | POA: Diagnosis not present

## 2015-10-01 DIAGNOSIS — Z880 Allergy status to penicillin: Secondary | ICD-10-CM | POA: Diagnosis not present

## 2015-10-01 DIAGNOSIS — Z8049 Family history of malignant neoplasm of other genital organs: Secondary | ICD-10-CM | POA: Diagnosis not present

## 2015-10-01 DIAGNOSIS — I6529 Occlusion and stenosis of unspecified carotid artery: Secondary | ICD-10-CM | POA: Diagnosis present

## 2015-10-01 DIAGNOSIS — Z886 Allergy status to analgesic agent status: Secondary | ICD-10-CM | POA: Diagnosis not present

## 2015-10-01 DIAGNOSIS — K279 Peptic ulcer, site unspecified, unspecified as acute or chronic, without hemorrhage or perforation: Secondary | ICD-10-CM | POA: Diagnosis present

## 2015-10-01 DIAGNOSIS — Z7982 Long term (current) use of aspirin: Secondary | ICD-10-CM | POA: Diagnosis not present

## 2015-10-01 DIAGNOSIS — Z882 Allergy status to sulfonamides status: Secondary | ICD-10-CM | POA: Diagnosis not present

## 2015-10-01 DIAGNOSIS — H5462 Unqualified visual loss, left eye, normal vision right eye: Secondary | ICD-10-CM | POA: Diagnosis present

## 2015-10-01 DIAGNOSIS — E785 Hyperlipidemia, unspecified: Secondary | ICD-10-CM | POA: Diagnosis present

## 2015-10-01 DIAGNOSIS — I10 Essential (primary) hypertension: Secondary | ICD-10-CM | POA: Diagnosis present

## 2015-10-01 DIAGNOSIS — Z85828 Personal history of other malignant neoplasm of skin: Secondary | ICD-10-CM | POA: Diagnosis not present

## 2015-10-01 DIAGNOSIS — Z8673 Personal history of transient ischemic attack (TIA), and cerebral infarction without residual deficits: Secondary | ICD-10-CM | POA: Diagnosis not present

## 2015-10-01 DIAGNOSIS — H3412 Central retinal artery occlusion, left eye: Secondary | ICD-10-CM | POA: Diagnosis present

## 2015-10-01 DIAGNOSIS — Z825 Family history of asthma and other chronic lower respiratory diseases: Secondary | ICD-10-CM | POA: Diagnosis not present

## 2015-10-01 DIAGNOSIS — H3410 Central retinal artery occlusion, unspecified eye: Secondary | ICD-10-CM | POA: Diagnosis present

## 2015-10-01 DIAGNOSIS — Z833 Family history of diabetes mellitus: Secondary | ICD-10-CM | POA: Diagnosis not present

## 2015-10-01 DIAGNOSIS — Z87891 Personal history of nicotine dependence: Secondary | ICD-10-CM | POA: Diagnosis not present

## 2015-10-01 DIAGNOSIS — D649 Anemia, unspecified: Secondary | ICD-10-CM | POA: Diagnosis present

## 2015-10-01 DIAGNOSIS — E7800 Pure hypercholesterolemia, unspecified: Secondary | ICD-10-CM | POA: Diagnosis present

## 2015-10-01 HISTORY — PX: ENDARTERECTOMY: SHX5162

## 2015-10-01 LAB — GLUCOSE, CAPILLARY
GLUCOSE-CAPILLARY: 158 mg/dL — AB (ref 65–99)
Glucose-Capillary: 172 mg/dL — ABNORMAL HIGH (ref 65–99)
Glucose-Capillary: 153 mg/dL — ABNORMAL HIGH (ref 65–99)

## 2015-10-01 SURGERY — ENDARTERECTOMY, CAROTID
Anesthesia: General | Laterality: Left | Wound class: Clean

## 2015-10-01 MED ORDER — ACETAMINOPHEN 650 MG RE SUPP: 325.0000 mg | RECTAL | Status: DC | PRN

## 2015-10-01 MED ORDER — LACTATED RINGERS IV SOLN
INTRAVENOUS | Status: DC | PRN
  Administered 2015-10-01: 10:00:00 via INTRAVENOUS

## 2015-10-01 MED ORDER — NITROGLYCERIN IN D5W 200-5 MCG/ML-% IV SOLN: 5.0000 ug/min | INTRAVENOUS | Status: DC

## 2015-10-01 MED ORDER — MORPHINE SULFATE (PF) 2 MG/ML IV SOLN
2.0000 mg | INTRAVENOUS | Status: DC | PRN
  Administered 2015-10-01: 4 mg via INTRAVENOUS
  Administered 2015-10-02: 0.5 mg via INTRAVENOUS
  Filled 2015-10-01: qty 1
  Filled 2015-10-01: qty 2

## 2015-10-01 MED ORDER — SUCCINYLCHOLINE CHLORIDE 20 MG/ML IJ SOLN
INTRAMUSCULAR | Status: DC | PRN
  Administered 2015-10-01: 120 mg via INTRAVENOUS

## 2015-10-01 MED ORDER — DOCUSATE SODIUM 100 MG PO CAPS
100.0000 mg | ORAL_CAPSULE | Freq: Every day | ORAL | Status: DC
  Administered 2015-10-02: 100 mg via ORAL
  Filled 2015-10-01: qty 1

## 2015-10-01 MED ORDER — BISACODYL 10 MG RE SUPP: 10.0000 mg | Freq: Every day | RECTAL | Status: DC | PRN

## 2015-10-01 MED ORDER — TRAMADOL HCL 50 MG PO TABS
50.0000 mg | ORAL_TABLET | Freq: Four times a day (QID) | ORAL | Status: DC | PRN
Start: 2015-10-01 — End: 2015-10-02
  Administered 2015-10-01 – 2015-10-02 (×2): 50 mg via ORAL
  Filled 2015-10-01 (×2): qty 1

## 2015-10-01 MED ORDER — POTASSIUM CHLORIDE CRYS ER 20 MEQ PO TBCR: 20.0000 meq | EXTENDED_RELEASE_TABLET | Freq: Every day | ORAL | Status: DC | PRN

## 2015-10-01 MED ORDER — LABETALOL HCL 5 MG/ML IV SOLN: 10.0000 mg | INTRAVENOUS | Status: DC | PRN

## 2015-10-01 MED ORDER — SODIUM CHLORIDE 0.9 % IV SOLN: 500.0000 mL | Freq: Once | INTRAVENOUS | Status: DC | PRN

## 2015-10-01 MED ORDER — LIDOCAINE HCL 1 % IJ SOLN
INTRAMUSCULAR | Status: DC | PRN
  Administered 2015-10-01: 10 mL

## 2015-10-01 MED ORDER — METOPROLOL TARTRATE 1 MG/ML IV SOLN: 2.0000 mg | INTRAVENOUS | Status: DC | PRN

## 2015-10-01 MED ORDER — LABETALOL HCL 5 MG/ML IV SOLN
INTRAVENOUS | Status: DC | PRN
  Administered 2015-10-01: 5 mg via INTRAVENOUS

## 2015-10-01 MED ORDER — DOCUSATE SODIUM 100 MG PO CAPS: 100.0000 mg | ORAL_CAPSULE | Freq: Two times a day (BID) | ORAL | Status: DC

## 2015-10-01 MED ORDER — ONDANSETRON HCL 4 MG/2ML IJ SOLN
4.0000 mg | Freq: Once | INTRAMUSCULAR | Status: AC | PRN
  Administered 2015-10-01: 14:00:00 4 mg via INTRAVENOUS

## 2015-10-01 MED ORDER — ONDANSETRON HCL 4 MG/2ML IJ SOLN
4.0000 mg | Freq: Four times a day (QID) | INTRAMUSCULAR | Status: DC | PRN
  Administered 2015-10-01 – 2015-10-02 (×3): 4 mg via INTRAVENOUS
  Filled 2015-10-01 (×3): qty 2

## 2015-10-01 MED ORDER — MIDAZOLAM HCL 2 MG/2ML IJ SOLN
INTRAMUSCULAR | Status: DC | PRN
  Administered 2015-10-01: 2 mg via INTRAVENOUS

## 2015-10-01 MED ORDER — VANCOMYCIN HCL IN DEXTROSE 1-5 GM/200ML-% IV SOLN
1000.0000 mg | Freq: Two times a day (BID) | INTRAVENOUS | Status: AC
  Administered 2015-10-01: 1000 mg via INTRAVENOUS
  Filled 2015-10-01 (×3): qty 200

## 2015-10-01 MED ORDER — OXYCODONE-ACETAMINOPHEN 5-325 MG PO TABS
1.0000 | ORAL_TABLET | ORAL | Status: DC | PRN
  Filled 2015-10-01: qty 1

## 2015-10-01 MED ORDER — ALUM & MAG HYDROXIDE-SIMETH 200-200-20 MG/5ML PO SUSP: 15.0000 mL | ORAL | Status: DC | PRN

## 2015-10-01 MED ORDER — SODIUM CHLORIDE 0.9 % IV SOLN
INTRAVENOUS | Status: DC
  Administered 2015-10-01: 22:00:00 via INTRAVENOUS
  Administered 2015-10-01: 75 mL via INTRAVENOUS

## 2015-10-01 MED ORDER — SODIUM CHLORIDE 0.9 % IV SOLN
10000.0000 ug | INTRAVENOUS | Status: DC | PRN
  Administered 2015-10-01: 25 ug/min via INTRAVENOUS

## 2015-10-01 MED ORDER — HEPARIN SODIUM (PORCINE) 1000 UNIT/ML IJ SOLN
INTRAMUSCULAR | Status: DC | PRN
  Administered 2015-10-01: 6000 [IU] via INTRAVENOUS

## 2015-10-01 MED ORDER — FAMOTIDINE IN NACL 20-0.9 MG/50ML-% IV SOLN
20.0000 mg | Freq: Two times a day (BID) | INTRAVENOUS | Status: DC
  Administered 2015-10-01 – 2015-10-02 (×3): 20 mg via INTRAVENOUS
  Filled 2015-10-01 (×4): qty 50

## 2015-10-01 MED ORDER — MAGNESIUM SULFATE 2 GM/50ML IV SOLN
2.0000 g | Freq: Every day | INTRAVENOUS | Status: DC | PRN
  Filled 2015-10-01: qty 50

## 2015-10-01 MED ORDER — EVICEL 2 ML EX KIT
PACK | CUTANEOUS | Status: DC | PRN
  Administered 2015-10-01: 2 mL

## 2015-10-01 MED ORDER — ACETAMINOPHEN 325 MG PO TABS: 325.0000 mg | ORAL_TABLET | ORAL | Status: DC | PRN

## 2015-10-01 MED ORDER — PROPOFOL 10 MG/ML IV BOLUS
INTRAVENOUS | Status: DC | PRN
  Administered 2015-10-01: 150 mg via INTRAVENOUS

## 2015-10-01 MED ORDER — CLOPIDOGREL BISULFATE 75 MG PO TABS
75.0000 mg | ORAL_TABLET | Freq: Every day | ORAL | Status: DC
  Administered 2015-10-02: 75 mg via ORAL
  Filled 2015-10-01: qty 1

## 2015-10-01 MED ORDER — ESMOLOL HCL-SODIUM CHLORIDE 2000 MG/100ML IV SOLN
25.0000 ug/kg/min | INTRAVENOUS | Status: DC
  Filled 2015-10-01: qty 100

## 2015-10-01 MED ORDER — FENTANYL CITRATE (PF) 100 MCG/2ML IJ SOLN
25.0000 ug | INTRAMUSCULAR | Status: AC | PRN
  Administered 2015-10-01 (×6): 25 ug via INTRAVENOUS

## 2015-10-01 MED ORDER — SODIUM CHLORIDE 0.9 % IV SOLN
INTRAVENOUS | Status: DC | PRN
  Administered 2015-10-01: 50 mL via INTRAMUSCULAR

## 2015-10-01 MED ORDER — FENTANYL CITRATE (PF) 100 MCG/2ML IJ SOLN
INTRAMUSCULAR | Status: DC | PRN
  Administered 2015-10-01: 100 ug via INTRAVENOUS

## 2015-10-01 MED ORDER — VANCOMYCIN HCL 1000 MG IV SOLR
1000.0000 mg | INTRAVENOUS | Status: DC | PRN
  Administered 2015-10-01: 1000 mg via INTRAVENOUS

## 2015-10-01 MED ORDER — VANCOMYCIN HCL IN DEXTROSE 1-5 GM/200ML-% IV SOLN
1000.0000 mg | Freq: Two times a day (BID) | INTRAVENOUS | Status: DC
  Filled 2015-10-01: qty 200

## 2015-10-01 MED ORDER — ROCURONIUM BROMIDE 100 MG/10ML IV SOLN
INTRAVENOUS | Status: DC | PRN
  Administered 2015-10-01: 10 mg via INTRAVENOUS
  Administered 2015-10-01: 40 mg via INTRAVENOUS

## 2015-10-01 MED ORDER — GUAIFENESIN-DM 100-10 MG/5ML PO SYRP: 15.0000 mL | ORAL_SOLUTION | ORAL | Status: DC | PRN

## 2015-10-01 MED ORDER — EPHEDRINE SULFATE 50 MG/ML IJ SOLN
INTRAMUSCULAR | Status: DC | PRN
  Administered 2015-10-01: 15 mg via INTRAVENOUS

## 2015-10-01 MED ORDER — HYDRALAZINE HCL 20 MG/ML IJ SOLN: 5.0000 mg | INTRAMUSCULAR | Status: DC | PRN

## 2015-10-01 MED ORDER — PHENYLEPHRINE HCL 10 MG/ML IJ SOLN
INTRAMUSCULAR | Status: DC | PRN
  Administered 2015-10-01 (×2): 100 ug via INTRAVENOUS

## 2015-10-01 MED ORDER — LIDOCAINE HCL (CARDIAC) 20 MG/ML IV SOLN
INTRAVENOUS | Status: DC | PRN
  Administered 2015-10-01: 100 mg via INTRAVENOUS

## 2015-10-01 MED ORDER — PHENOL 1.4 % MT LIQD
1.0000 | OROMUCOSAL | Status: DC | PRN
  Filled 2015-10-01: qty 177

## 2015-10-01 MED ORDER — DOPAMINE-DEXTROSE 3.2-5 MG/ML-% IV SOLN: 3.0000 ug/kg/min | INTRAVENOUS | Status: DC

## 2015-10-01 SURGICAL SUPPLY — 57 items
BAG DECANTER FOR FLEXI CONT (MISCELLANEOUS) ×2 IMPLANT
BLADE SURG 15 STRL LF DISP TIS (BLADE) ×1 IMPLANT
BLADE SURG 15 STRL SS (BLADE) ×1
BLADE SURG SZ11 CARB STEEL (BLADE) ×2 IMPLANT
BOOT SUTURE AID YELLOW STND (SUTURE) ×2 IMPLANT
BRUSH SCRUB 4% CHG (MISCELLANEOUS) ×2 IMPLANT
CANISTER SUCT 1200ML W/VALVE (MISCELLANEOUS) ×2 IMPLANT
CATH TRAY 16F METER LATEX (MISCELLANEOUS) ×2 IMPLANT
DRAPE INCISE IOBAN 66X45 STRL (DRAPES) ×2 IMPLANT
DRAPE PED LAPAROTOMY (DRAPES) ×2 IMPLANT
DRAPE SHEET LG 3/4 BI-LAMINATE (DRAPES) ×2 IMPLANT
DRSG TEGADERM 4X4.75 (GAUZE/BANDAGES/DRESSINGS) IMPLANT
DRSG TELFA 3X8 NADH (GAUZE/BANDAGES/DRESSINGS) IMPLANT
DURAPREP 26ML APPLICATOR (WOUND CARE) ×2 IMPLANT
ELECT CAUTERY BLADE 6.4 (BLADE) ×2 IMPLANT
EVICEL 2ML SEALANT HUMAN (Miscellaneous) ×2 IMPLANT
GLOVE BIO SURGEON STRL SZ7 (GLOVE) ×14 IMPLANT
GOWN STRL REUS W/ TWL LRG LVL3 (GOWN DISPOSABLE) ×4 IMPLANT
GOWN STRL REUS W/ TWL XL LVL3 (GOWN DISPOSABLE) IMPLANT
GOWN STRL REUS W/TWL LRG LVL3 (GOWN DISPOSABLE) ×4
GOWN STRL REUS W/TWL XL LVL3 (GOWN DISPOSABLE)
HEMOSTAT SURGICEL 2X3 (HEMOSTASIS) ×2 IMPLANT
IV NS 250ML (IV SOLUTION) ×1
IV NS 250ML BAXH (IV SOLUTION) ×1 IMPLANT
KIT RM TURNOVER STRD PROC AR (KITS) ×2 IMPLANT
LABEL OR SOLS (LABEL) ×2 IMPLANT
LIQUID BAND (GAUZE/BANDAGES/DRESSINGS) ×2 IMPLANT
LOOP RED MAXI  1X406MM (MISCELLANEOUS) ×2
LOOP VESSEL MAXI 1X406 RED (MISCELLANEOUS) ×2 IMPLANT
LOOP VESSEL MINI 0.8X406 BLUE (MISCELLANEOUS) ×1 IMPLANT
LOOPS BLUE MINI 0.8X406MM (MISCELLANEOUS) ×1
NEEDLE FILTER BLUNT 18X 1/2SAF (NEEDLE) ×1
NEEDLE FILTER BLUNT 18X1 1/2 (NEEDLE) ×1 IMPLANT
NEEDLE HYPO 25X1 1.5 SAFETY (NEEDLE) ×2 IMPLANT
NS IRRIG 1000ML POUR BTL (IV SOLUTION) ×2 IMPLANT
PACK BASIN MAJOR ARMC (MISCELLANEOUS) ×2 IMPLANT
PAD GROUND ADULT SPLIT (MISCELLANEOUS) ×2 IMPLANT
PATCH CAROTID ECM VASC 1X10 (Prosthesis & Implant Heart) ×2 IMPLANT
PENCIL ELECTRO HAND CTR (MISCELLANEOUS) IMPLANT
SHUNT CAROTID PRUITT F3 T3103A (SHUNT) ×2 IMPLANT
SUT MNCRL 4-0 (SUTURE) ×1
SUT MNCRL 4-0 27XMFL (SUTURE) ×1
SUT PROLENE 6 0 BV (SUTURE) ×10 IMPLANT
SUT PROLENE 7 0 BV 1 (SUTURE) ×6 IMPLANT
SUT SILK 2 0 (SUTURE) ×1
SUT SILK 2-0 18XBRD TIE 12 (SUTURE) ×1 IMPLANT
SUT SILK 3 0 (SUTURE) ×1
SUT SILK 3-0 18XBRD TIE 12 (SUTURE) ×1 IMPLANT
SUT SILK 4 0 (SUTURE) ×1
SUT SILK 4-0 18XBRD TIE 12 (SUTURE) ×1 IMPLANT
SUT VIC AB 3-0 SH 27 (SUTURE) ×2
SUT VIC AB 3-0 SH 27X BRD (SUTURE) ×2 IMPLANT
SUTURE MNCRL 4-0 27XMF (SUTURE) ×1 IMPLANT
SYR 20CC LL (SYRINGE) ×2 IMPLANT
SYRINGE 10CC LL (SYRINGE) ×4 IMPLANT
TOWEL OR 17X26 4PK STRL BLUE (TOWEL DISPOSABLE) ×2 IMPLANT
TUBING CONNECTING 10 (TUBING) IMPLANT

## 2015-10-01 NOTE — Plan of Care (Signed)
Problem: Discharge/Transitional Outcomes Goal: Other Discharge Outcomes/Goals Plan of care progress to goal: - No complaints of pain. - Ambulates in room. - IV fluids infusing. - NIH score 2. Will continue to monitor.

## 2015-10-01 NOTE — Anesthesia Preprocedure Evaluation (Addendum)
Anesthesia Evaluation  Patient identified by MRN, date of birth, ID band Patient awake    Reviewed: Allergy & Precautions, NPO status , Patient's Chart, lab work & pertinent test results, reviewed documented beta blocker date and time   Airway Mallampati: III  TM Distance: >3 FB     Dental  (+) Chipped   Pulmonary former smoker,           Cardiovascular hypertension, Pt. on medications and Pt. on home beta blockers + CAD and + Peripheral Vascular Disease       Neuro/Psych CVA, No Residual Symptoms    GI/Hepatic PUD,   Endo/Other  diabetes  Renal/GU      Musculoskeletal   Abdominal   Peds  Hematology  (+) anemia ,   Anesthesia Other Findings Obesity. TIA. Cardiac stent. Plavix. EKG ok. Anemic Hb 9.8. Allen's test ok. Aortic regurg.  Reproductive/Obstetrics                            Anesthesia Physical Anesthesia Plan  ASA: III  Anesthesia Plan: General   Post-op Pain Management:    Induction: Intravenous  Airway Management Planned: Oral ETT  Additional Equipment: Arterial line  Intra-op Plan:   Post-operative Plan:   Informed Consent: I have reviewed the patients History and Physical, chart, labs and discussed the procedure including the risks, benefits and alternatives for the proposed anesthesia with the patient or authorized representative who has indicated his/her understanding and acceptance.     Plan Discussed with: CRNA  Anesthesia Plan Comments:         Anesthesia Quick Evaluation

## 2015-10-01 NOTE — Progress Notes (Signed)
[place] [facility] [place] NAME: Joyce Potter    MR#:  [id number]  DATE OF BIRTH:  01-11-1946  SUBJECTIVE:  No acute issues overnight. Patient is to go for CEA this morning. No change in her physical examination, she continues to have vision loss of the left eye REVIEW OF SYSTEMS:    Review of Systems  Constitutional: Negative for fever, chills and malaise/fatigue.  HENT: Negative for sore throat.        Left-sided visual loss and central vision not peripheral  Eyes: Negative for blurred vision.  Respiratory: Negative for cough, hemoptysis, shortness of breath and wheezing.   Cardiovascular: Negative for chest pain, palpitations and leg swelling.  Gastrointestinal: Negative for nausea, vomiting, abdominal pain, diarrhea and blood in stool.  Genitourinary: Negative for dysuria.  Musculoskeletal: Negative for back pain.  Neurological: Negative for dizziness, tremors and headaches.  Endo/Heme/Allergies: Does not bruise/bleed easily.    Tolerating Diet: Yes      DRUG ALLERGIES:   Allergies  Allergen Reactions  . Aspirin Other (See Comments)    ulcers  . Invokamet [Canagliflozin-Metformin Hcl] Other (See Comments)    Yeast infection  . Sulfa Antibiotics Other (See Comments)    "Got Drunk"  . Lovastatin Rash  . Penicillins Rash  . Vicodin [Hydrocodone-Acetaminophen] Rash    VITALS:  Blood pressure 135/53, pulse 58, temperature 98.4 F (36.9 C), temperature source Oral, resp. rate 16, height 5\' 4"  (1.626 m), weight 92.987 kg (205 lb), SpO2 93 %.  PHYSICAL EXAMINATION:   Physical Exam  Constitutional: She is oriented to person, place, and time and well-developed, well-nourished, and in no distress. No distress.  HENT:  Head: Normocephalic.  Eyes: No scleral icterus.  Left eye central vision loss  Neck: Normal range of motion. Neck supple. No JVD present. No tracheal deviation present.  Cardiovascular: Normal rate,  regular rhythm and normal heart sounds.  Exam reveals no gallop and no friction rub.   No murmur heard. Pulmonary/Chest: Effort normal and breath sounds normal. No respiratory distress. She has no wheezes. She has no rales. She exhibits no tenderness.  Abdominal: Soft. Bowel sounds are normal. She exhibits no distension and no mass. There is no tenderness. There is no rebound and no guarding.  Musculoskeletal: Normal range of motion. She exhibits no edema.  Neurological: She is alert and oriented to person, place, and time.  Skin: Skin is warm. No rash noted. No erythema.  Psychiatric: Affect and judgment normal.      LABORATORY PANEL:   CBC  Recent Labs Lab 09/29/15 0413  WBC 4.5  HGB 9.8*  HCT 30.5*  PLT 245   ------------------------------------------------------------------------------------------------------------------  Chemistries   Recent Labs Lab 09/29/15 0413  NA 140  K 4.0  CL 106  CO2 27  GLUCOSE 149*  BUN 15  CREATININE 0.89  CALCIUM 8.8*   ------------------------------------------------------------------------------------------------------------------  Cardiac Enzymes  Recent Labs Lab 09/28/15 1128  TROPONINI <0.03   ------------------------------------------------------------------------------------------------------------------  RADIOLOGY:  Ct Angio Head W/cm &/or Wo Cm  09/29/2015   CLINICAL DATA:  69 year old female with left face weakness and left side vision loss. Central retinal artery occlusion suspected. Initial encounter.  EXAM: CT ANGIOGRAPHY HEAD AND NECK  TECHNIQUE: Multidetector CT imaging of the head and neck was performed using the standard protocol during bolus administration of intravenous contrast. Multiplanar CT image reconstructions and MIPs were obtained to evaluate the vascular anatomy. Carotid stenosis measurements (when applicable) are obtained utilizing NASCET criteria, using  the distal internal carotid diameter as the  denominator.  CONTRAST:  163mL OMNIPAQUE IOHEXOL 350 MG/ML SOLN  COMPARISON:  Brain MRI and Head CT without contrast 09/28/2015.  FINDINGS: CT HEAD  Brain: Stable gray-white matter differentiation throughout the brain. No midline shift, mass effect, or evidence of intracranial mass lesion. No ventriculomegaly. No acute intracranial hemorrhage identified. Cerebral volume is normal for age.  Calvarium and skull base:  No acute osseous abnormality identified.  Paranasal sinuses: Stable bubbly opacity in the sphenoid sinuses. Mild left maxillary sinus mucosal thickening.  Orbits: Prior cataract surgery changes to both globes, otherwise negative.  CTA NECK  Skeleton: Absent dentition. Degenerative changes in the cervical spine. No acute osseous abnormality identified.  Other neck: Mosaic attenuation in the visualized upper lobes. No superior mediastinal lymphadenopathy.  Thyroid, larynx, pharynx, parapharyngeal spaces, retropharyngeal space, sublingual space, submandibular glands, and parotid glands are within normal limits. No cervical lymphadenopathy.  Aortic arch: 3 vessel arch configuration. Intermittent soft and calcified arch atherosclerosis. No great vessel origin stenosis.  Right carotid system: Streak artifact from dense left subclavian vein contrast mildly obscures the right CCA origin. Otherwise negative right CCA proximal to the carotid bifurcation. At the bifurcation there is soft and calcified plaque affecting the right ICA origin and bulb, but subsequent stenosis is less than 50 % with respect to the distal vessel. Mildly tortuous but otherwise negative cervical right ICA.  Left carotid system: Negative left CCA proximal to the carotid bifurcation. At the left carotid bifurcation there is calcified plaque affecting the left ICA origin and bulb. Subsequent stenosis up to 65 % with respect to the distal vessel (series 14, image 171) at the level of the proximal bulb. Tortuous left ICA distal to the bulb with  a kinked appearance at the C2 level (series 10, image 28). Otherwise negative cervical left ICA.  Vertebral arteries:No proximal subclavian artery stenosis. Both vertebral artery origins are within normal limits; the left is mildly obscured by paravertebral venous contrast reflux. Fairly codominant vertebral arteries are patent and negative to the skullbase.  CTA HEAD  Posterior circulation: Codominant distal vertebral arteries, the left functionally terminates in PICA. Diminutive or absent right PICA. The basilar artery is patent without stenosis but diminutive. Normal SCA origins. Fetal type bilateral PCA origins with tortuous posterior communicating arteries. Bilateral PCA branches are within normal limits.  Anterior circulation: Right ICA siphon is patent without stenosis and there is only mild supra clinoid calcified plaque. Right ophthalmic and posterior communicating artery origins are normal.  Left ICA siphon is patent with mild to moderate cavernous segment and supraclinoid calcified plaque. No significant left ICA siphon stenosis. Also, the left ophthalmic artery appears to have an unusually proximal origin, in the distal cavernous segment proximal to the left anterior genu (see series 11, image 152 and series 7, image 232) but remains patent.  Normal left posterior communicating artery origin. Patent carotid termini. Normal MCA and ACA origins.  Anterior communicating artery and bilateral ACA branches are within normal limits. Left MCA M1 segment, bifurcation, and left MCA branches are within normal limits. Right MCA M1 segment, bifurcation, and right MCA branches are within normal limits.  Venous sinuses: Patent.  Anatomic variants: Unusually proximal origin of the left ophthalmic artery. Fetal type bilateral PCA origins.  Delayed phase: No abnormal enhancement identified.  IMPRESSION: 1. Left greater than right carotid bifurcation atherosclerosis in the neck. Proximal left ICA stenosis up to 65%. Mild  for age left greater than right ICA siphon calcified  plaque. 2. No other hemodynamically significant stenosis in the head or neck. No emergent large vessel occlusion. 3. Patent left ophthalmic artery, within unusually proximal origin from the left ICA siphon (normal anatomic variation). 4. Stable, negative for age CT appearance of the head.   Electronically Signed   By: Genevie Ann M.D.   On: 09/29/2015 16:10   Ct Angio Neck W/cm &/or Wo/cm  09/29/2015   CLINICAL DATA:  69 year old female with left face weakness and left side vision loss. Central retinal artery occlusion suspected. Initial encounter.  EXAM: CT ANGIOGRAPHY HEAD AND NECK  TECHNIQUE: Multidetector CT imaging of the head and neck was performed using the standard protocol during bolus administration of intravenous contrast. Multiplanar CT image reconstructions and MIPs were obtained to evaluate the vascular anatomy. Carotid stenosis measurements (when applicable) are obtained utilizing NASCET criteria, using the distal internal carotid diameter as the denominator.  CONTRAST:  161mL OMNIPAQUE IOHEXOL 350 MG/ML SOLN  COMPARISON:  Brain MRI and Head CT without contrast 09/28/2015.  FINDINGS: CT HEAD  Brain: Stable gray-white matter differentiation throughout the brain. No midline shift, mass effect, or evidence of intracranial mass lesion. No ventriculomegaly. No acute intracranial hemorrhage identified. Cerebral volume is normal for age.  Calvarium and skull base:  No acute osseous abnormality identified.  Paranasal sinuses: Stable bubbly opacity in the sphenoid sinuses. Mild left maxillary sinus mucosal thickening.  Orbits: Prior cataract surgery changes to both globes, otherwise negative.  CTA NECK  Skeleton: Absent dentition. Degenerative changes in the cervical spine. No acute osseous abnormality identified.  Other neck: Mosaic attenuation in the visualized upper lobes. No superior mediastinal lymphadenopathy.  Thyroid, larynx, pharynx, parapharyngeal  spaces, retropharyngeal space, sublingual space, submandibular glands, and parotid glands are within normal limits. No cervical lymphadenopathy.  Aortic arch: 3 vessel arch configuration. Intermittent soft and calcified arch atherosclerosis. No great vessel origin stenosis.  Right carotid system: Streak artifact from dense left subclavian vein contrast mildly obscures the right CCA origin. Otherwise negative right CCA proximal to the carotid bifurcation. At the bifurcation there is soft and calcified plaque affecting the right ICA origin and bulb, but subsequent stenosis is less than 50 % with respect to the distal vessel. Mildly tortuous but otherwise negative cervical right ICA.  Left carotid system: Negative left CCA proximal to the carotid bifurcation. At the left carotid bifurcation there is calcified plaque affecting the left ICA origin and bulb. Subsequent stenosis up to 65 % with respect to the distal vessel (series 14, image 171) at the level of the proximal bulb. Tortuous left ICA distal to the bulb with a kinked appearance at the C2 level (series 10, image 28). Otherwise negative cervical left ICA.  Vertebral arteries:No proximal subclavian artery stenosis. Both vertebral artery origins are within normal limits; the left is mildly obscured by paravertebral venous contrast reflux. Fairly codominant vertebral arteries are patent and negative to the skullbase.  CTA HEAD  Posterior circulation: Codominant distal vertebral arteries, the left functionally terminates in PICA. Diminutive or absent right PICA. The basilar artery is patent without stenosis but diminutive. Normal SCA origins. Fetal type bilateral PCA origins with tortuous posterior communicating arteries. Bilateral PCA branches are within normal limits.  Anterior circulation: Right ICA siphon is patent without stenosis and there is only mild supra clinoid calcified plaque. Right ophthalmic and posterior communicating artery origins are normal.  Left  ICA siphon is patent with mild to moderate cavernous segment and supraclinoid calcified plaque. No significant left ICA siphon stenosis. Also,  the left ophthalmic artery appears to have an unusually proximal origin, in the distal cavernous segment proximal to the left anterior genu (see series 11, image 152 and series 7, image 232) but remains patent.  Normal left posterior communicating artery origin. Patent carotid termini. Normal MCA and ACA origins.  Anterior communicating artery and bilateral ACA branches are within normal limits. Left MCA M1 segment, bifurcation, and left MCA branches are within normal limits. Right MCA M1 segment, bifurcation, and right MCA branches are within normal limits.  Venous sinuses: Patent.  Anatomic variants: Unusually proximal origin of the left ophthalmic artery. Fetal type bilateral PCA origins.  Delayed phase: No abnormal enhancement identified.  IMPRESSION: 1. Left greater than right carotid bifurcation atherosclerosis in the neck. Proximal left ICA stenosis up to 65%. Mild for age left greater than right ICA siphon calcified plaque. 2. No other hemodynamically significant stenosis in the head or neck. No emergent large vessel occlusion. 3. Patent left ophthalmic artery, within unusually proximal origin from the left ICA siphon (normal anatomic variation). 4. Stable, negative for age CT appearance of the head.   Electronically Signed   By: Genevie Ann M.D.   On: 09/29/2015 16:10     ASSESSMENT AND PLAN:   69 year old female with a history of diabetes, peptic ulcer disease and aortic stenosis who presented with loss of vision in her left eye.  1. Central artery retinal occlusion: Patient saw the ophthalmologist prior to admission and was ruled out for retinal detachment. MRI negative for acute stroke. CT showing 65% blockage on left carotid artery. Plan is for CEA today. Continue aspirin and Plavix for secondary prevention. She will need to follow-up with ophthalmology.  Appreciate neurology and vascular surgery consultation.  2. Type 2 diabetes without complication: Patient continue her outpatient regimen of 70/30 insulin.  3. Hyperlipidemia: Patient's LDL is 131 which is not at goal. She was started on low-dose statin. She is intolerant to high-dose statins.  4. History of aortic stenosis: Patient follows with Dr. Ubaldo Glassing 5. History of peptic ulcer disease: Patient has had no bleeding since she's been off of Plavix. We did review side effects, alternatives and benefits of Plavix and other blood thinners.   Management plans discussed with the patient and she is in agreement.  CODE STATUS: FULL  TOTAL TIME TAKING CARE OF THIS PATIENT: 25 minutes.     POSSIBLE D/C tomorrow, DEPENDING ON CLINICAL CONDITION.   Kelis Plasse M.D on 09/29/2015 at 10:47 AM  Between 7am to 6pm - Pager - [phone] After 6pm go to [email or web address] - password [email or web address] [facility]  Office  [phone]  CC: Primary care physician; [place] Pink, MD  Note: This dictation was prepared with Dragon dictation along with smaller phrase technology. Any transcriptional errors that result from this process are unintentional.

## 2015-10-01 NOTE — Op Note (Signed)
[facility]   OPERATIVE NOTE  PROCEDURE:   1.  Left carotid endarterectomy with CorMatrix arterial patch reconstruction  PRE-OPERATIVE DIAGNOSIS: 1.  Left carotid stenosis with recent TIA  POST-OPERATIVE DIAGNOSIS: same as above   SURGEON: Leotis Pain, MD  ASSISTANT(S): Dr. Hortencia Pilar  ANESTHESIA: General  ESTIMATED BLOOD LOSS: 50 cc  FINDING(S): 1.  left carotid plaque.  SPECIMEN(S):  Carotid plaque (sent to Pathology)  INDICATIONS:   Joyce Potter is a 69 y.o. female who presents with recent TIA carotid stenosis of 65%. The Neurology service strongly recommended she have this treated surgically prior to discharge. I discussed with the patient the risks, benefits, and alternatives to carotid endarterectomy.  I discussed the differences between carotid stenting and carotid endarterectomy. I discussed the procedural details of carotid endarterectomy with the patient.  The patient is aware that the risks of carotid endarterectomy include but are not limited to: bleeding, infection, stroke, myocardial infarction, death, cranial nerve injuries both temporary and permanent, neck hematoma, possible airway compromise, labile blood pressure post-operatively, cerebral hyperperfusion syndrome, and possible need for additional interventions in the future. The patient is aware of the risks and agrees to proceed forward with the procedure.  DESCRIPTION: After full informed written consent was obtained from the patient, the patient was brought back to the operating room and placed supine upon the operating table.  Prior to induction, the patient received IV antibiotics.  After obtaining adequate anesthesia, the patient was placed into a modified beach chair position with a shoulder roll in place and the patient's neck slightly hyperextended and rotated away from the surgical site.  The patient was prepped in the standard fashion for a carotid endarterectomy.  I made an  incision anterior to the sternocleidomastoid muscle and dissected down through the subcutaneous tissue.  The platysmas was opened with electrocautery.  Then I dissected down to the internal jugular vein and facial vein.  The facial vein is ligated and divided between 2-0 silk ties.  This was dissected posteriorly until I obtained visualization of the common carotid artery.  This was dissected out and then a vessel loop was placed around the common carotid artery.  I then dissected in a periadventitial fashion along the common carotid artery up to the bifurcation.  I then identified the external carotid artery and the superior thyroid artery.  I placed a vessel loop around the superior thyroid artery, and I also dissected out the external carotid artery and placed a vessel loop around it. In the process of this dissection, the hypoglossal nerve was identified and protected from harm.  I then dissected out the internal carotid artery until I identified an area in the internal carotid artery clearly above the stenosis.  I dissected slightly distal to this area, and placed a vessel loop around the artery.  At this point, we gave the patient 6000 units of intravenous heparin.  After this was allowed to circulate for several minutes, I pulled up control on the vessel loops to clamp the internal carotid artery, external carotid artery, superior thyroid artery, and then the common carotid artery.  I then made an arteriotomy in the common carotid artery with a 11 blade, and extended the arteriotomy with a Potts scissor down into the common carotid artery, then I carried the arteriotomy through the bifurcation into the internal carotid artery until I reached an area that was not diseased.  At this point, I took the [place] shunt that previously been prepared and  I inserted it into the internal carotid artery first, and then into the common carotid artery taking care to flush and de-air prior to release of control. At  this point, I started the endarterectomy in the common carotid artery with a [place]field elevator and carried this dissection down into the common carotid artery circumferentially.  Then I transected the plaque at a segment where it was adherent and transected the plaque with Potts scissors.  I then carried this dissection up into the external carotid artery.  The plaque was extracted by unclamping the external carotid artery and performing an eversion endarterectomy.  The dissection was then carried into the internal carotid artery where a nice feathered end point was created with gentle traction.  I passed the plaque off the field as a specimen. At this point I removed all loose flecks and remaining disease possible.  At this point, I was satisfied that the minimal remaining disease was densely adherent to the wall and wall integrity was intact. The distal endpoint was tacked down with three 7-0 Prolene sutures.  I then fashioned a CorMatrix arterial patch for the artery and sewed it in place with two running stitch of 6-0 Prolene.  I started at the distal endpoint and ran one half the length of the arteriotomy.  I then cut and beveled the patch to an appropriate length to match the arteriotomy.  I started the second 6-0 Prolene at the proximal end point.  The medial suture line was completed and the lateral suture line was run approximately one quarter the length of the arteriotomy.  Prior to completing this patch angioplasty, I removed the shunt first from the internal carotid artery, from which there was excellent backbleeding, and clamped it.  Then I removed the shunt from the common carotid artery, from which there was excellent antegrade bleeding, and then clamped it.  At this point, I allowed the external carotid artery to backbleed, which was excellent.  Then I instilled heparinized saline in this patched artery and then completed the patch angioplasty in the usual fashion.  First, I released the clamp on the  external carotid artery, then I released it on the common carotid artery.  After waiting a few seconds, I then released it on the internal carotid artery. Several minutes of pressure were held and 6-0 Prolene patch sutures were used as need for hemostasis.  At this point, I placed Surgicel and Evicel topical hemostatic agents.  There was no more active bleeding in the surgical site.  The sternocleidomastoid space was closed with three interrupted 3-0 Vicryl sutures. I then reapproximated the platysma muscle with a running stitch of 3-0 Vicryl.  The skin was then closed with a running subcuticular 4-0 Monocryl.  The skin was then cleaned, dried and Dermabond was used to reinforce the skin closure.  The patient awakened and was taken to the recovery room in stable condition, following commands and moving all four extremities without any apparent deficits.    COMPLICATIONS: none  CONDITION: stable  Kyrese Gartman  10/01/2015, 12:21 PM

## 2015-10-01 NOTE — Anesthesia Procedure Notes (Signed)
Procedure Name: Intubation Date/Time: 10/01/2015 10:49 AM Performed by: Nelda Marseille Pre-anesthesia Checklist: Patient identified, Patient being monitored, Timeout performed, Emergency Drugs available and Suction available Patient Re-evaluated:Patient Re-evaluated prior to inductionOxygen Delivery Method: Circle system utilized Preoxygenation: Pre-oxygenation with 100% oxygen Intubation Type: IV induction Ventilation: Mask ventilation without difficulty Laryngoscope Size: Mac and 3 Grade View: Grade I Tube type: Oral Tube size: 7.0 mm Number of attempts: 1 Airway Equipment and Method: Stylet Placement Confirmation: ETT inserted through vocal cords under direct vision,  positive ETCO2 and breath sounds checked- equal and bilateral Secured at: 21 cm Tube secured with: Tape Dental Injury: Teeth and Oropharynx as per pre-operative assessment

## 2015-10-01 NOTE — Transfer of Care (Signed)
Immediate Anesthesia Transfer of Care Note  Patient: Joyce Potter  Procedure(s) Performed: Procedure(s): ENDARTERECTOMY CAROTID (Left)  Patient Location: PACU  Anesthesia Type:General  Level of Consciousness: awake and oriented  Airway & Oxygen Therapy: Patient Spontanous Breathing and Patient connected to face mask oxygen  Post-op Assessment: Report given to RN and Post -op Vital signs reviewed and stable  Post vital signs: Reviewed and stable  Last Vitals:  Filed Vitals:   10/01/15 0800  BP: 135/53  Pulse: 58  Temp:   Resp:     Complications: No apparent anesthesia complications

## 2015-10-02 ENCOUNTER — Other Ambulatory Visit: Payer: Self-pay | Admitting: Internal Medicine

## 2015-10-02 ENCOUNTER — Encounter: Payer: Self-pay | Admitting: Vascular Surgery

## 2015-10-02 LAB — GLUCOSE, CAPILLARY
GLUCOSE-CAPILLARY: 153 mg/dL — AB (ref 65–99)
Glucose-Capillary: 152 mg/dL — ABNORMAL HIGH (ref 65–99)
Glucose-Capillary: 102 mg/dL — ABNORMAL HIGH (ref 65–99)
Glucose-Capillary: 156 mg/dL — ABNORMAL HIGH (ref 65–99)

## 2015-10-02 LAB — CBC
HEMATOCRIT: 27.9 % — AB (ref 35.0–47.0)
Hemoglobin: 8.9 g/dL — ABNORMAL LOW (ref 12.0–16.0)
MCH: 27 pg (ref 26.0–34.0)
MCHC: 32.1 g/dL (ref 32.0–36.0)
MCV: 84 fL (ref 80.0–100.0)
PLATELETS: 219 10*3/uL (ref 150–440)
RBC: 3.31 MIL/uL — ABNORMAL LOW (ref 3.80–5.20)
RDW: 14.3 % (ref 11.5–14.5)
WBC: 6.5 10*3/uL (ref 3.6–11.0)

## 2015-10-02 LAB — MRSA PCR SCREENING: MRSA by PCR: NEGATIVE

## 2015-10-02 LAB — BASIC METABOLIC PANEL
Anion gap: 4 — ABNORMAL LOW (ref 5–15)
BUN: 19 mg/dL (ref 6–20)
CHLORIDE: 108 mmol/L (ref 101–111)
CO2: 27 mmol/L (ref 22–32)
CREATININE: 0.69 mg/dL (ref 0.44–1.00)
Calcium: 8.1 mg/dL — ABNORMAL LOW (ref 8.9–10.3)
GFR calc Af Amer: >60 mL/min (ref >60)
GFR calc non Af Amer: >60 mL/min (ref >60)
Glucose, Bld: 149 mg/dL — ABNORMAL HIGH (ref 65–99)
POTASSIUM: 4.3 mmol/L (ref 3.5–5.1)
SODIUM: 139 mmol/L (ref 135–145)

## 2015-10-02 LAB — SURGICAL PATHOLOGY

## 2015-10-02 MED ORDER — SODIUM CHLORIDE 0.9 % IV SOLN: Freq: Once | INTRAVENOUS | Status: DC

## 2015-10-02 MED ORDER — FAMOTIDINE 20 MG PO TABS: 20.0000 mg | ORAL_TABLET | Freq: Two times a day (BID) | ORAL | Status: DC

## 2015-10-02 MED ORDER — TRAMADOL HCL 50 MG PO TABS: 50.0000 mg | ORAL_TABLET | Freq: Four times a day (QID) | ORAL | Status: DC | PRN

## 2015-10-02 MED ORDER — CLOPIDOGREL BISULFATE 75 MG PO TABS
75.0000 mg | ORAL_TABLET | Freq: Every day | ORAL | Status: DC
Start: 2015-10-02 — End: 2016-06-01

## 2015-10-02 NOTE — Discharge Summary (Signed)
[facility]    Discharge Summary    Patient ID:  Joyce Potter MRN: [id number] DOB/AGE: May 07, 1946 69 y.o.  Admit date: 09/28/2015 Discharge date: 10/02/2015 Date of Surgery: 09/28/2015 - 10/01/2015 Surgeon: Surgeon(s): Algernon Huxley, MD Katha Cabal, MD  Admission Diagnosis: Stroke [facility]) [I63.9] Central retinal artery occlusion, left [H34.12]  Discharge Diagnoses:  Stroke [facility]) [I63.9] Central retinal artery occlusion, left [H34.12]  Secondary Diagnoses: Past Medical History  Diagnosis Date  . IDDM (insulin dependent diabetes mellitus) ([place])   . High cholesterol   . HTN (hypertension)   . Multiple thyroid nodules     Benign  . CAD (coronary artery disease)   . Cataract   . Peptic ulcer disease   . Aortic stenosis   . Hyperlipidemia   . Basal cell carcinoma   . Anemia     Procedure(s): ENDARTERECTOMY CAROTID  Discharged Condition: good  HPI:  Admitted with left retinal artery occlusion.  Had left CEA for this and did well.  Hospital Course:  Joyce Potter is a 69 y.o. female is S/P left Procedure(s): ENDARTERECTOMY CAROTID Extubated: in OR Physical exam: left eye visual deficits from retinal artery occlusion.  Neuro exam otherwise intact Post-op wounds C/D/I.  No hematoma Pt. Ambulating, voiding and taking PO diet without difficulty. Pt pain controlled with PO pain meds. Labs as below Complications:none  Consults:  Treatment Team:  Katha Cabal, MD  Significant Diagnostic Studies: CBC Lab Results  Component Value Date   WBC 6.5 10/02/2015   HGB 8.9* 10/02/2015   HCT 27.9* 10/02/2015   MCV 84.0 10/02/2015   PLT 219 10/02/2015    BMET    Component Value Date/Time   NA 139 10/02/2015 0421   NA 143 05/04/2013 0614   K 4.3 10/02/2015 0421   K 3.8 05/04/2013 0614   CL 108 10/02/2015 0421   CL 110* 05/04/2013 0614   CO2 27 10/02/2015 0421   CO2 27 05/04/2013 0614   GLUCOSE 149* 10/02/2015 0421    GLUCOSE 130* 05/04/2013 0614   BUN 19 10/02/2015 0421   BUN 12 05/04/2013 0614   CREATININE 0.69 10/02/2015 0421   CREATININE 0.79 08/27/2014 1445   CALCIUM 8.1* 10/02/2015 0421   CALCIUM 8.2* 05/04/2013 0614   GFRNONAA >60 10/02/2015 0421   GFRNONAA >60 08/27/2014 1445   GFRAA >60 10/02/2015 0421   GFRAA >60 08/27/2014 1445   COAG Lab Results  Component Value Date   INR 1.0 05/03/2013   INR 1.0 04/02/2013   INR 0.9 01/09/2013     Disposition:  Discharge to :home Discharge Instructions    Call MD for:  difficulty breathing, headache or visual disturbances    Complete by:  As directed      Call MD for:  extreme fatigue    Complete by:  As directed      Call MD for:  persistant dizziness or light-headedness    Complete by:  As directed      Diet - low sodium heart healthy    Complete by:  As directed      Discharge instructions    Complete by:  As directed   DIET: Cardiac diet/DIABETIC  DISCHARGE CONDITION: Stable  ACTIVITY: Activity as tolerated NO DRIVING  OXYGEN: Home Oxygen: No.   Oxygen Delivery: room air   If you experience worsening of your admission symptoms, develop shortness of breath, life threatening emergency, suicidal or homicidal thoughts you must seek medical attention immediately by  calling 911 or calling your MD immediately  if symptoms less severe.  You Must read complete instructions/literature along with all the possible adverse reactions/side effects for all the Medicines you take and that have been prescribed to you. Take any new Medicines after you have completely understood and accept all the possible adverse reactions/side effects.   Please note  You were cared for by a hospitalist during your hospital stay. If you have any questions about your discharge medications or the care you received while you were in the hospital after you are discharged, you can call the unit and asked to speak with the hospitalist on call if the hospitalist that  took care of you is not available. Once you are discharged, your primary care physician will handle any further medical issues. Please note that NO REFILLS for any discharge medications will be authorized once you are discharged, as it is imperative that you return to your primary care physician (or establish a relationship with a primary care physician if you do not have one) for your aftercare needs so that they can reassess your need for medications and monitor your lab values.     Increase activity slowly    Complete by:  As directed             Medication List    TAKE these medications        ascorbic acid 500 MG tablet  Commonly known as:  VITAMIN C  Take 500 mg by mouth daily.     clopidogrel 75 MG tablet  Commonly known as:  PLAVIX  Take 1 tablet (75 mg total) by mouth daily with breakfast.     ferrous sulfate 325 (65 FE) MG tablet  Take 325 mg by mouth daily with breakfast.     Flaxseed Oil 1000 MG Caps  Take 1,000 mg by mouth daily.     GARLIC PO  Take 1 capsule by mouth daily.     insulin NPH-regular Human (70-30) 100 UNIT/ML injection  Commonly known as:  NOVOLIN 70/30  Inject into the skin 2 (two) times daily with a meal. 48U in am and 28U in pm     losartan-hydrochlorothiazide 100-25 MG tablet  Commonly known as:  HYZAAR  Take 1 tablet by mouth daily.     metoprolol 100 MG tablet  Commonly known as:  LOPRESSOR  Take 100 mg by mouth 2 (two) times daily.     multivitamin with minerals tablet  Take 1 tablet by mouth daily.     omeprazole 40 MG capsule  Commonly known as:  PRILOSEC  Take 40 mg by mouth daily.     pantoprazole 40 MG tablet  Commonly known as:  PROTONIX  Take 40 mg by mouth daily.     pravastatin 10 MG tablet  Commonly known as:  PRAVACHOL  Take 1 tablet (10 mg total) by mouth daily at 6 PM.     RA BIOTIN 1000 MCG tablet  Generic drug:  Biotin  Take 1,000 mcg by mouth daily.     traMADol 50 MG tablet  Commonly known as:  ULTRAM   Take 1 tablet (50 mg total) by mouth every 6 (six) hours as needed for moderate pain.       Verbal and written Discharge instructions given to the patient. Wound care per Discharge AVS     Follow-up Information    Follow up with Maryland Pink, MD.   Specialty:  Family Medicine   Contact information:  [address] [place] [address] [phone]       Follow up with opthomology. Call in 1 week.      Follow up with Jahmier Willadsen, MD In 4 weeks.   Specialties:  Vascular Surgery, Radiology, Interventional Cardiology   Why:  with carotid duplex   Contact information:   [place] [address] [phone]       Signed: Leotis Pain, MD  10/02/2015, 10:08 AM

## 2015-10-02 NOTE — Progress Notes (Signed)
Patient has clear liquid diet, states 'not very hungry" Scheduled to receive 48 units 70/30 insulin. When addressed with patient, stated she has had "problems with blood sugar, it bottomed out Tuesday". Will not administer insulin at this time, continue to monitor.

## 2015-10-02 NOTE — Progress Notes (Signed)
[facility]  Daily Progress Note   Subjective  - 1 Day Post-Op  Did well overnight.  No major events.  Objective Filed Vitals:   10/02/15 0300 10/02/15 0700 10/02/15 0800 10/02/15 0900  BP: 108/68 122/44 118/40 97/45  Pulse: 63 60 62 66  Temp:      TempSrc:      Resp: 11 10 10 11   Height:      Weight:      SpO2:  100% 100% 96%    Intake/Output Summary (Last 24 hours) at 10/02/15 1003 Last data filed at 10/02/15 0851  Gross per 24 hour  Intake 2288.75 ml  Output    320 ml  Net 1968.75 ml    PULM  CTAB CV  RRR VASC  Neck C/D/I. No significant swelling.  Left eye deficits that were present before surgery, but no new deficits.  Laboratory CBC    Component Value Date/Time   WBC 6.5 10/02/2015 0421   WBC 5.9 12/17/2014 1222   HGB 8.9* 10/02/2015 0421   HGB 11.3* 12/17/2014 1222   HCT 27.9* 10/02/2015 0421   HCT 35.3 12/17/2014 1222   PLT 219 10/02/2015 0421   PLT 279 12/17/2014 1222    BMET    Component Value Date/Time   NA 139 10/02/2015 0421   NA 143 05/04/2013 0614   K 4.3 10/02/2015 0421   K 3.8 05/04/2013 0614   CL 108 10/02/2015 0421   CL 110* 05/04/2013 0614   CO2 27 10/02/2015 0421   CO2 27 05/04/2013 0614   GLUCOSE 149* 10/02/2015 0421   GLUCOSE 130* 05/04/2013 0614   BUN 19 10/02/2015 0421   BUN 12 05/04/2013 0614   CREATININE 0.69 10/02/2015 0421   CREATININE 0.79 08/27/2014 1445   CALCIUM 8.1* 10/02/2015 0421   CALCIUM 8.2* 05/04/2013 0614   GFRNONAA >60 10/02/2015 0421   GFRNONAA >60 08/27/2014 1445   GFRAA >60 10/02/2015 0421   GFRAA >60 08/27/2014 1445    Assessment/Planning: POD #1 s/p left CEA   Doing well.  Advance diet.   Will be OK to discharge home today on Plavix and statin.  Can do tramadol for pain  RTC 3-4 weeks in the office    DEW,JASON  10/02/2015, 10:03 AM

## 2015-10-02 NOTE — Progress Notes (Signed)
Lab reported Hgb of 5.7 at 0300.  MD informed, new orders including transfusion were entered.  Lab redrew blood after stating lack of confidence of integrity of sample.  New results report Hgb of 8.9.  MD again informed.  Orders to transfuse discontinued by MD.    Art line removed.  Catheter appears intact.  Pressure held to site.  No bleeding noted.  Pt tolerated well.

## 2015-10-02 NOTE — Anesthesia Postprocedure Evaluation (Signed)
  Anesthesia Post-op Note  Patient: Joyce Potter  Procedure(s) Performed: Procedure(s): ENDARTERECTOMY CAROTID (Left)  Anesthesia type:General  Patient location: PACU  Post pain: Pain level controlled  Post assessment: Post-op Vital signs reviewed, Patient's Cardiovascular Status Stable, Respiratory Function Stable, Patent Airway and No signs of Nausea or vomiting  Post vital signs: Reviewed and stable  Last Vitals:  Filed Vitals:   10/02/15 0300  BP: 108/68  Pulse: 63  Temp:   Resp: 11    Level of consciousness: awake, alert  and patient cooperative  Complications: No apparent anesthesia complications

## 2015-10-02 NOTE — Progress Notes (Signed)
NEUROLOGY NOTE  S: Pt has some L neck pain but otherwise ok  ROS neg x 8 systems except per HPI  O: 98.6   118/40   62   15 No distress, nl weight Normocephalic, oropharynx clear Supple, no JVD CTA B, no wheezing RRR, no murmur No C/C/E  A+Ox3, nl speech and language PERRLA, EOMI, face symmetric 5/5 B, nl tone  A/P: 1. L carotid stenosis- Stable after CEA 2. L central retinal artery occlusion- Stable, from 1. - Continue Plavix and statin - Will sign off, please call with questions or if new symptoms develop -  Needs to f/u with [facility] in 4-6 weeks

## 2015-10-02 NOTE — Progress Notes (Signed)
Patient discharge per Dr Lucky Cowboy order. Dr. Volanda Napoleon had also seen patient this morning. SR on cardiac monitor, RA- SATs WNL. Patient had been nauseated x 2 today, Zofran IV administered. After IV remove and patient ready to be discharged again become nauseated and vomited. Left neck site clean, approximated.  Notify Dr. Lucky Cowboy of above, MD will call in nausea medication to patient [place] on [address]. Patient discharged with dgt and husband, PIV x 2 removed. Discharge instructions reviewed with patient, follow-up MD appointments to be called by patient.

## 2015-10-06 ENCOUNTER — Encounter: Payer: Self-pay | Admitting: Internal Medicine

## 2015-10-06 ENCOUNTER — Inpatient Hospital Stay: Payer: Commercial Managed Care - HMO

## 2015-10-07 ENCOUNTER — Inpatient Hospital Stay: Payer: Commercial Managed Care - HMO | Attending: Internal Medicine

## 2015-10-07 DIAGNOSIS — Z79899 Other long term (current) drug therapy: Secondary | ICD-10-CM | POA: Diagnosis not present

## 2015-10-07 DIAGNOSIS — D509 Iron deficiency anemia, unspecified: Secondary | ICD-10-CM | POA: Insufficient documentation

## 2015-10-07 DIAGNOSIS — D649 Anemia, unspecified: Secondary | ICD-10-CM

## 2015-10-07 LAB — CBC WITH DIFFERENTIAL/PLATELET
Basophils Absolute: 0 10*3/uL (ref 0–0.1)
Basophils Relative: 1 %
EOS ABS: 0.4 10*3/uL (ref 0–0.7)
EOS PCT: 8 %
HCT: 31.6 % — ABNORMAL LOW (ref 35.0–47.0)
HEMOGLOBIN: 10.2 g/dL — AB (ref 12.0–16.0)
LYMPHS ABS: 1.1 10*3/uL (ref 1.0–3.6)
Lymphocytes Relative: 20 %
MCH: 26.8 pg (ref 26.0–34.0)
MCHC: 32.2 g/dL (ref 32.0–36.0)
MCV: 83.1 fL (ref 80.0–100.0)
MONOS PCT: 11 %
Monocytes Absolute: 0.6 10*3/uL (ref 0.2–0.9)
NEUTROS PCT: 60 %
Neutro Abs: 3.4 10*3/uL (ref 1.4–6.5)
Platelets: 332 10*3/uL (ref 150–440)
RBC: 3.8 MIL/uL (ref 3.80–5.20)
RDW: 14.5 % (ref 11.5–14.5)
WBC: 5.6 10*3/uL (ref 3.6–11.0)

## 2015-10-07 LAB — FERRITIN: FERRITIN: 20 ng/mL (ref 11–307)

## 2015-10-07 LAB — IRON AND TIBC
Iron: 30 ug/dL (ref 28–170)
Saturation Ratios: 10 % — ABNORMAL LOW (ref 10.4–31.8)
TIBC: 295 ug/dL (ref 250–450)
UIBC: 265 ug/dL

## 2015-11-18 ENCOUNTER — Other Ambulatory Visit: Payer: Self-pay | Admitting: Vascular Surgery

## 2015-11-18 ENCOUNTER — Other Ambulatory Visit
Admission: RE | Admit: 2015-11-18 | Discharge: 2015-11-18 | Disposition: A | Discharge status: Home / Self Care | Payer: Commercial Managed Care - HMO | Source: Ambulatory Visit | Attending: Ophthalmology | Admitting: Ophthalmology

## 2015-11-18 DIAGNOSIS — H3412 Central retinal artery occlusion, left eye: Secondary | ICD-10-CM | POA: Diagnosis present

## 2015-11-18 LAB — CBC
HEMATOCRIT: 28 % — AB (ref 35.0–47.0)
HEMOGLOBIN: 9 g/dL — AB (ref 12.0–16.0)
MCH: 26.6 pg (ref 26.0–34.0)
MCHC: 32.2 g/dL (ref 32.0–36.0)
MCV: 82.7 fL (ref 80.0–100.0)
Platelets: 339 10*3/uL (ref 150–440)
RBC: 3.39 MIL/uL — ABNORMAL LOW (ref 3.80–5.20)
RDW: 15 % — ABNORMAL HIGH (ref 11.5–14.5)
WBC: 5.9 10*3/uL (ref 3.6–11.0)

## 2015-11-18 LAB — C-REACTIVE PROTEIN: CRP: 1.1 mg/dL — AB (ref <1.0)

## 2015-11-18 LAB — SEDIMENTATION RATE: Sed Rate: 58 mm/hr — ABNORMAL HIGH (ref 0–30)

## 2015-11-19 ENCOUNTER — Ambulatory Visit: Payer: Commercial Managed Care - HMO | Admitting: Anesthesiology

## 2015-11-19 ENCOUNTER — Ambulatory Visit
Admission: RE | Admit: 2015-11-19 | Discharge: 2015-11-19 | Disposition: A | Discharge status: Home / Self Care | Payer: Commercial Managed Care - HMO | Source: Ambulatory Visit | Attending: Vascular Surgery | Admitting: Vascular Surgery

## 2015-11-19 ENCOUNTER — Encounter
Admission: RE | Disposition: A | Discharge status: Home / Self Care | Payer: Self-pay | Source: Ambulatory Visit | Attending: Vascular Surgery

## 2015-11-19 ENCOUNTER — Encounter: Payer: Self-pay | Admitting: *Deleted

## 2015-11-19 DIAGNOSIS — I119 Hypertensive heart disease without heart failure: Secondary | ICD-10-CM | POA: Insufficient documentation

## 2015-11-19 DIAGNOSIS — Z87891 Personal history of nicotine dependence: Secondary | ICD-10-CM | POA: Insufficient documentation

## 2015-11-19 DIAGNOSIS — Z9841 Cataract extraction status, right eye: Secondary | ICD-10-CM | POA: Insufficient documentation

## 2015-11-19 DIAGNOSIS — Z886 Allergy status to analgesic agent status: Secondary | ICD-10-CM | POA: Diagnosis not present

## 2015-11-19 DIAGNOSIS — Z9842 Cataract extraction status, left eye: Secondary | ICD-10-CM | POA: Diagnosis not present

## 2015-11-19 DIAGNOSIS — Z8711 Personal history of peptic ulcer disease: Secondary | ICD-10-CM | POA: Insufficient documentation

## 2015-11-19 DIAGNOSIS — Z85828 Personal history of other malignant neoplasm of skin: Secondary | ICD-10-CM | POA: Diagnosis not present

## 2015-11-19 DIAGNOSIS — Z8049 Family history of malignant neoplasm of other genital organs: Secondary | ICD-10-CM | POA: Diagnosis not present

## 2015-11-19 DIAGNOSIS — Z823 Family history of stroke: Secondary | ICD-10-CM | POA: Diagnosis not present

## 2015-11-19 DIAGNOSIS — E7800 Pure hypercholesterolemia, unspecified: Secondary | ICD-10-CM | POA: Diagnosis not present

## 2015-11-19 DIAGNOSIS — Z9889 Other specified postprocedural states: Secondary | ICD-10-CM | POA: Diagnosis not present

## 2015-11-19 DIAGNOSIS — Z882 Allergy status to sulfonamides status: Secondary | ICD-10-CM | POA: Insufficient documentation

## 2015-11-19 DIAGNOSIS — R51 Headache: Secondary | ICD-10-CM | POA: Insufficient documentation

## 2015-11-19 DIAGNOSIS — E119 Type 2 diabetes mellitus without complications: Secondary | ICD-10-CM | POA: Insufficient documentation

## 2015-11-19 DIAGNOSIS — E785 Hyperlipidemia, unspecified: Secondary | ICD-10-CM | POA: Insufficient documentation

## 2015-11-19 DIAGNOSIS — Z880 Allergy status to penicillin: Secondary | ICD-10-CM | POA: Diagnosis not present

## 2015-11-19 DIAGNOSIS — Z803 Family history of malignant neoplasm of breast: Secondary | ICD-10-CM | POA: Insufficient documentation

## 2015-11-19 DIAGNOSIS — I2510 Atherosclerotic heart disease of native coronary artery without angina pectoris: Secondary | ICD-10-CM | POA: Insufficient documentation

## 2015-11-19 DIAGNOSIS — H5462 Unqualified visual loss, left eye, normal vision right eye: Secondary | ICD-10-CM | POA: Insufficient documentation

## 2015-11-19 DIAGNOSIS — Z885 Allergy status to narcotic agent status: Secondary | ICD-10-CM | POA: Insufficient documentation

## 2015-11-19 DIAGNOSIS — Z955 Presence of coronary angioplasty implant and graft: Secondary | ICD-10-CM | POA: Diagnosis not present

## 2015-11-19 DIAGNOSIS — Z833 Family history of diabetes mellitus: Secondary | ICD-10-CM | POA: Insufficient documentation

## 2015-11-19 DIAGNOSIS — Z90710 Acquired absence of both cervix and uterus: Secondary | ICD-10-CM | POA: Insufficient documentation

## 2015-11-19 DIAGNOSIS — Z794 Long term (current) use of insulin: Secondary | ICD-10-CM | POA: Insufficient documentation

## 2015-11-19 DIAGNOSIS — Z825 Family history of asthma and other chronic lower respiratory diseases: Secondary | ICD-10-CM | POA: Insufficient documentation

## 2015-11-19 HISTORY — DX: Unspecified retinal vascular occlusion: H34.9

## 2015-11-19 HISTORY — PX: ARTERY BIOPSY: SHX891

## 2015-11-19 LAB — BASIC METABOLIC PANEL
ANION GAP: 6 (ref 5–15)
BUN: 22 mg/dL — ABNORMAL HIGH (ref 6–20)
CHLORIDE: 103 mmol/L (ref 101–111)
CO2: 27 mmol/L (ref 22–32)
Calcium: 9.7 mg/dL (ref 8.9–10.3)
Creatinine, Ser: 0.87 mg/dL (ref 0.44–1.00)
GFR calc Af Amer: >60 mL/min (ref >60)
GFR calc non Af Amer: >60 mL/min (ref >60)
GLUCOSE: 233 mg/dL — AB (ref 65–99)
POTASSIUM: 4.7 mmol/L (ref 3.5–5.1)
Sodium: 136 mmol/L (ref 135–145)

## 2015-11-19 LAB — CBC WITH DIFFERENTIAL/PLATELET
BASOS ABS: 0 10*3/uL (ref 0–0.1)
Basophils Relative: 0 %
Eosinophils Absolute: 0 10*3/uL (ref 0–0.7)
Eosinophils Relative: 0 %
HEMATOCRIT: 27.5 % — AB (ref 35.0–47.0)
Hemoglobin: 8.6 g/dL — ABNORMAL LOW (ref 12.0–16.0)
LYMPHS ABS: 0.9 10*3/uL — AB (ref 1.0–3.6)
LYMPHS PCT: 14 %
MCH: 25.8 pg — AB (ref 26.0–34.0)
MCHC: 31.3 g/dL — AB (ref 32.0–36.0)
MCV: 82.5 fL (ref 80.0–100.0)
MONO ABS: 0.3 10*3/uL (ref 0.2–0.9)
MONOS PCT: 5 %
NEUTROS ABS: 5.3 10*3/uL (ref 1.4–6.5)
Neutrophils Relative %: 81 %
Platelets: 306 10*3/uL (ref 150–440)
RBC: 3.33 MIL/uL — ABNORMAL LOW (ref 3.80–5.20)
RDW: 14.9 % — AB (ref 11.5–14.5)
WBC: 6.5 10*3/uL (ref 3.6–11.0)

## 2015-11-19 LAB — TYPE AND SCREEN
ABO/RH(D): O POS
Antibody Screen: NEGATIVE

## 2015-11-19 LAB — PROTIME-INR
INR: 1.11
Prothrombin Time: 14.5 seconds (ref 11.4–15.0)

## 2015-11-19 LAB — APTT: aPTT: 32 seconds (ref 24–36)

## 2015-11-19 LAB — ABO/RH: ABO/RH(D): O POS

## 2015-11-19 LAB — GLUCOSE, CAPILLARY
GLUCOSE-CAPILLARY: 193 mg/dL — AB (ref 65–99)
Glucose-Capillary: 232 mg/dL — ABNORMAL HIGH (ref 65–99)

## 2015-11-19 SURGERY — BIOPSY TEMPORAL ARTERY
Anesthesia: General | Laterality: Left | Wound class: Clean

## 2015-11-19 MED ORDER — FENTANYL CITRATE (PF) 100 MCG/2ML IJ SOLN
INTRAMUSCULAR | Status: AC
  Administered 2015-11-19: 25 ug via INTRAVENOUS
  Filled 2015-11-19: qty 2

## 2015-11-19 MED ORDER — EPHEDRINE SULFATE 50 MG/ML IJ SOLN
INTRAMUSCULAR | Status: DC | PRN
  Administered 2015-11-19: 15 mg via INTRAVENOUS

## 2015-11-19 MED ORDER — CLINDAMYCIN PHOSPHATE 300 MG/50ML IV SOLN
INTRAVENOUS | Status: AC
  Filled 2015-11-19: qty 50

## 2015-11-19 MED ORDER — FENTANYL CITRATE (PF) 100 MCG/2ML IJ SOLN
INTRAMUSCULAR | Status: DC | PRN
  Administered 2015-11-19: 50 ug via INTRAVENOUS
  Administered 2015-11-19 (×2): 25 ug via INTRAVENOUS

## 2015-11-19 MED ORDER — FAMOTIDINE 20 MG PO TABS
20.0000 mg | ORAL_TABLET | Freq: Once | ORAL | Status: AC
  Administered 2015-11-19: 20 mg via ORAL

## 2015-11-19 MED ORDER — INSULIN ASPART 100 UNIT/ML ~~LOC~~ SOLN: 0.0000 [IU] | SUBCUTANEOUS | Status: DC

## 2015-11-19 MED ORDER — FAMOTIDINE 20 MG PO TABS
ORAL_TABLET | ORAL | Status: AC
  Filled 2015-11-19: qty 1

## 2015-11-19 MED ORDER — ONDANSETRON HCL 4 MG/2ML IJ SOLN: 4.0000 mg | Freq: Once | INTRAMUSCULAR | Status: DC | PRN

## 2015-11-19 MED ORDER — PHENYLEPHRINE HCL 10 MG/ML IJ SOLN
INTRAMUSCULAR | Status: DC | PRN
  Administered 2015-11-19: 100 ug via INTRAVENOUS

## 2015-11-19 MED ORDER — PROPOFOL 10 MG/ML IV BOLUS
INTRAVENOUS | Status: DC | PRN
  Administered 2015-11-19: 30 mg via INTRAVENOUS

## 2015-11-19 MED ORDER — LIDOCAINE HCL (CARDIAC) 20 MG/ML IV SOLN
INTRAVENOUS | Status: DC | PRN
  Administered 2015-11-19: 40 mg via INTRAVENOUS

## 2015-11-19 MED ORDER — FENTANYL CITRATE (PF) 100 MCG/2ML IJ SOLN
25.0000 ug | INTRAMUSCULAR | Status: DC | PRN
  Administered 2015-11-19 (×4): 25 ug via INTRAVENOUS

## 2015-11-19 MED ORDER — LIDOCAINE-EPINEPHRINE 1 %-1:100000 IJ SOLN
INTRAMUSCULAR | Status: AC
  Filled 2015-11-19: qty 1

## 2015-11-19 MED ORDER — MIDAZOLAM HCL 2 MG/2ML IJ SOLN
INTRAMUSCULAR | Status: DC | PRN
  Administered 2015-11-19: 2 mg via INTRAVENOUS

## 2015-11-19 MED ORDER — SODIUM CHLORIDE 0.9 % IV SOLN
INTRAVENOUS | Status: DC
  Administered 2015-11-19 (×2): via INTRAVENOUS

## 2015-11-19 MED ORDER — CLINDAMYCIN PHOSPHATE 300 MG/50ML IV SOLN: 300.0000 mg | Freq: Once | INTRAVENOUS | Status: DC

## 2015-11-19 SURGICAL SUPPLY — 35 items
BLADE SURG 15 STRL LF DISP TIS (BLADE) ×1 IMPLANT
BLADE SURG 15 STRL SS (BLADE) ×1
BLADE SURG SZ11 CARB STEEL (BLADE) ×2 IMPLANT
CNTNR SPEC 2.5X3XGRAD LEK (MISCELLANEOUS)
CONT SPEC 4OZ STER OR WHT (MISCELLANEOUS)
CONTAINER SPEC 2.5X3XGRAD LEK (MISCELLANEOUS) IMPLANT
COTTON BALL STRL MEDIUM (GAUZE/BANDAGES/DRESSINGS) ×2 IMPLANT
DRAPE LAPAROTOMY 77X122 PED (DRAPES) ×2 IMPLANT
DRESSING TELFA 4X3 1S ST N-ADH (GAUZE/BANDAGES/DRESSINGS) ×2 IMPLANT
ELECT CAUTERY BLADE 6.4 (BLADE) ×2 IMPLANT
GLOVE BIO SURGEON STRL SZ7 (GLOVE) ×10 IMPLANT
GOWN L4 XLG 20 PK N/S (GOWN DISPOSABLE) ×2 IMPLANT
GOWN STRL REUS W/ TWL LRG LVL3 (GOWN DISPOSABLE) ×1 IMPLANT
GOWN STRL REUS W/TWL LRG LVL3 (GOWN DISPOSABLE) ×1
KIT RM TURNOVER STRD PROC AR (KITS) ×2 IMPLANT
LABEL OR SOLS (LABEL) IMPLANT
LIQUID BAND (GAUZE/BANDAGES/DRESSINGS) ×2 IMPLANT
NEEDLE HYPO 25X1 1.5 SAFETY (NEEDLE) ×2 IMPLANT
NS IRRIG 500ML POUR BTL (IV SOLUTION) ×4 IMPLANT
PACK BASIN MINOR ARMC (MISCELLANEOUS) ×2 IMPLANT
PAD GROUND ADULT SPLIT (MISCELLANEOUS) ×2 IMPLANT
SOL PREP PVP 2OZ (MISCELLANEOUS) ×2
SOLUTION PREP PVP 2OZ (MISCELLANEOUS) ×1 IMPLANT
SUCTION FRAZIER TIP 10 FR DISP (SUCTIONS) ×2 IMPLANT
SUT MNCRL AB 4-0 PS2 18 (SUTURE) ×2 IMPLANT
SUT SILK 2 0 (SUTURE) ×1
SUT SILK 2-0 18XBRD TIE 12 (SUTURE) ×1 IMPLANT
SUT SILK 3 0 (SUTURE) ×1
SUT SILK 3-0 18XBRD TIE 12 (SUTURE) ×1 IMPLANT
SUT SILK 4 0 (SUTURE) ×1
SUT SILK 4-0 18XBRD TIE 12 (SUTURE) ×1 IMPLANT
SUT VIC AB 3-0 SH 27 (SUTURE) ×1
SUT VIC AB 3-0 SH 27X BRD (SUTURE) ×1 IMPLANT
SYR BULB IRRIG 60ML STRL (SYRINGE) ×2 IMPLANT
SYRINGE 10CC LL (SYRINGE) ×2 IMPLANT

## 2015-11-19 NOTE — Op Note (Signed)
        OPERATIVE NOTE   PRE-OPERATIVE DIAGNOSIS: suspected temporal arteritis, visual loss, headaches POST-OPERATIVE DIAGNOSIS: Same as above  PROCEDURE: 1.   left temporal artery biopsy  SURGEON: DEW,JASON, MD  ASSISTANT(S): none  ANESTHESIA: general  ESTIMATED BLOOD LOSS: Minimal  FINDING(S): 1.  none  SPECIMEN(S):  left superficial temporal artery sent to pathology  INDICATIONS:   Patient is a 69 y.o. female who presents with clinical suspicion for temporal arteritis.  She has visual loss and headaches. We were asked by her eye doctor for consideration for temporal artery biopsy. Risks and benefits were discussed and he was agreeable to proceed.  DESCRIPTION: After obtaining full informed written consent, the patient was brought back to the operating room and placed supine upon the operating table.  The patient received IV antibiotics prior to induction.  After obtaining adequate anesthesia, the patient was prepped and draped in the standard fashion. I then made an incision just in front of the left ear overlying the palpable pulse. I then dissected down through the subcutaneous tissues and identified the superficial temporal artery. This was dissected out over a several centimeters and branches were ligated and divided between silk ties. Care was used to avoid electrocautery around the artery. I then clamped the artery proximally and distally and transected the artery. The specimen was then sent to pathology. The proximal and distal artery were ligated with 3-0 silk ties. Hemostasis was achieved. The wound was then closed with a series of interrupted 3-0 Vicryl's and the skin was closed with a 4-0 Monocryl. Sterile dressing was placed. The patient was taken to the recovery room in stable condition having tolerated the procedure well.  COMPLICATIONS: None  CONDITION: Stable   DEW,JASON 11/19/2015 10:50 AM

## 2015-11-19 NOTE — Anesthesia Preprocedure Evaluation (Signed)
Anesthesia Evaluation  Patient identified by MRN, date of birth, ID band Patient awake    Reviewed: Allergy & Precautions, NPO status , Patient's Chart, lab work & pertinent test results, reviewed documented beta blocker date and time   History of Anesthesia Complications Negative for: history of anesthetic complications  Airway Mallampati: III  TM Distance: >3 FB     Dental  (+) Edentulous Upper, Edentulous Lower, Upper Dentures, Lower Dentures   Pulmonary neg shortness of breath, neg sleep apnea, COPD (mild), neg recent URI, former smoker,           Cardiovascular hypertension, Pt. on medications and Pt. on home beta blockers (-) angina+ CAD, + Cardiac Stents (placed in 2014) and + Peripheral Vascular Disease  (-) Past MI and (-) CABG + dysrhythmias (Currently in NSR) Atrial Fibrillation (-) Valvular Problems/Murmurs     Neuro/Psych neg Seizures CVA, No Residual Symptoms    GI/Hepatic Neg liver ROS, PUD, GERD  Medicated and Controlled,  Endo/Other  diabetes, Poorly Controlled, Type 2, Insulin DependentMorbid obesity  Renal/GU      Musculoskeletal   Abdominal   Peds  Hematology  (+) anemia ,   Anesthesia Other Findings Obesity. TIA. Cardiac stent. Plavix. EKG ok. Anemic Hb 9.8. Allen's test ok. Aortic regurg.  Reproductive/Obstetrics                             Anesthesia Physical  Anesthesia Plan  ASA: III  Anesthesia Plan: General   Post-op Pain Management:    Induction: Intravenous  Airway Management Planned: Oral ETT  Additional Equipment: Arterial line  Intra-op Plan:   Post-operative Plan:   Informed Consent: I have reviewed the patients History and Physical, chart, labs and discussed the procedure including the risks, benefits and alternatives for the proposed anesthesia with the patient or authorized representative who has indicated his/her understanding and acceptance.      Plan Discussed with: CRNA, Anesthesiologist and Surgeon  Anesthesia Plan Comments:         Anesthesia Quick Evaluation

## 2015-11-19 NOTE — H&P (Signed)
[facility] Admission History & Physical  MRN : [id number]  Joyce Potter is a 69 y.o. (04/25/1946) female who presents with chief complaint of No chief complaint on file. Marland Kitchen  History of Present Illness: Patient has been having visual symptoms for a couple of months.  Left eye basically blind now.  Was felt to have possible stroke with significant carotid disease and CEA done.  Now concern is for giant cell arteritis.  Has headaches too.  Her eye doctor has started her on steroids and requests temporal artery biopsy  Current Facility-Administered Medications  Medication Dose Route Frequency Provider Last Rate Last Dose  . 0.9 %  sodium chloride infusion   Intravenous Continuous Martha Clan, MD 50 mL/hr at 11/19/15 0850    . clindamycin (CLEOCIN) 300 MG/50ML IVPB           . clindamycin (CLEOCIN) IVPB 300 mg  300 mg Intravenous Once [employer], PA-C      . famotidine (PEPCID) 20 MG tablet           . insulin aspart (novoLOG) injection 0-24 Units  0-24 Units Subcutaneous 6 times per day Sela Hua, PA-C        Past Medical History  Diagnosis Date  . IDDM (insulin dependent diabetes mellitus) ([place])   . High cholesterol   . HTN (hypertension)   . Multiple thyroid nodules     Benign  . CAD (coronary artery disease)   . Cataract   . Peptic ulcer disease   . Aortic stenosis   . Hyperlipidemia   . Basal cell carcinoma   . Anemia   . Retinal artery occlusion     Past Surgical History  Procedure Laterality Date  . Coronary stent placement    . Partial hysterectomy    . Givens capsule study N/A 04/17/2013    Procedure: GIVENS CAPSULE STUDY;  Surgeon: Arta Silence, MD;  Location: [facility];  Service: Endoscopy;  Laterality: N/A;  patient ate breakfast at 7am   . Breast biopsy    . Coronary angioplasty    . Eye surgery    . Trigger finger release    . Cardiac catheterization N/A 08/08/2015    Procedure: Right and Left Heart Cath  and Coronary Angiography;  Surgeon: Teodoro Spray, MD;  Location: [facility];  Service: Cardiovascular;  Laterality: N/A;  . Endarterectomy Left 10/01/2015    Procedure: ENDARTERECTOMY CAROTID;  Surgeon: Algernon Huxley, MD;  Location: [facility] ORS;  Service: Vascular;  Laterality: Left;  . Cataract extraction w/ intraocular lens  implant, bilateral      Social History Social History  Substance Use Topics  . Smoking status: Former Research scientist (life sciences)  . Smokeless tobacco: None  . Alcohol Use: No    Family History Family History  Problem Relation Age of Onset  . Uterine cancer Mother   . Breast cancer Mother   . Colon cancer Neg Hx   . Seizures Father   . Stroke Father   . Diabetes Father   . COPD Father     Allergies  Allergen Reactions  . Aspirin Other (See Comments)    ulcers  . Invokamet [Canagliflozin-Metformin Hcl] Other (See Comments)    Yeast infection  . Sulfa Antibiotics Other (See Comments)    "Got Drunk"  . Lovastatin Rash  . Penicillins Rash  . Vicodin [Hydrocodone-Acetaminophen] Rash     REVIEW OF SYSTEMS (Negative unless checked)  Constitutional: [] Weight loss  [] Fever  []   Chills Cardiac: [] Chest pain   [] Chest pressure   [] Palpitations   [] Shortness of breath when laying flat   [] Shortness of breath at rest   [] Shortness of breath with exertion. Vascular:  [] Pain in legs with walking   [] Pain in legs at rest   [] Pain in legs when laying flat   [] Claudication   [] Pain in feet when walking  [] Pain in feet at rest  [] Pain in feet when laying flat   [] History of DVT   [] Phlebitis   [] Swelling in legs   [] Varicose veins   [] Non-healing ulcers Pulmonary:   [] Uses home oxygen   [] Productive cough   [] Hemoptysis   [] Wheeze  [] COPD   [] Asthma Neurologic:  [] Dizziness  [] Blackouts   [] Seizures   [] History of stroke   [] History of TIA  [] Aphasia   [] Temporary blindness   [] Dysphagia   [] Weakness or numbness in arms   [] Weakness or numbness in legs Musculoskeletal:  [] Arthritis    [] Joint swelling   [] Joint pain   [] Low back pain Hematologic:  [] Easy bruising  [] Easy bleeding   [] Hypercoagulable state   [] Anemic  [] Hepatitis Gastrointestinal:  [] Blood in stool   [] Vomiting blood  [] Gastroesophageal reflux/heartburn   [] Difficulty swallowing. Genitourinary:  [] Chronic kidney disease   [] Difficult urination  [] Frequent urination  [] Burning with urination   [] Blood in urine Skin:  [] Rashes   [] Ulcers   [] Wounds Psychological:  [] History of anxiety   []  History of major depression.  Physical Examination  Filed Vitals:   11/19/15 0803  BP: 169/80  Pulse: 74  Temp: 97.9 F (36.6 C)  TempSrc: Oral  Resp: 18  Height: 5\' 3"  (1.6 m)  Weight: 93.895 kg (207 lb)  SpO2: 100%   Body mass index is 36.68 kg/(m^2). Gen: WD/WN, NAD Head: [place]/AT, No temporalis wasting. Prominent temp pulse not noted. Ear/Nose/Throat: Hearing grossly intact, nares w/o erythema or drainage, oropharynx w/o Erythema/Exudate,  Eyes: PERRLA, EOMI.  Neck: Supple, no nuchal rigidity.  No bruit or JVD.  Pulmonary:  Good air movement, clear to auscultation bilaterally, no use of accessory muscles.  Cardiac: RRR, normal S1, S2, no Murmurs, rubs or gallops. Vascular:  Vessel Right Left  Radial Palpable Palpable                                   Gastrointestinal: soft, non-tender/non-distended. No guarding/reflex.  Musculoskeletal: M/S 5/5 throughout.  Extremities without ischemic changes.  No deformity or atrophy.  Neurologic: CN 2-12 intact. Pain and light touch intact in extremities.  Symmetrical.  Speech is fluent. Motor exam as listed above. Psychiatric: Judgment intact, Mood & affect appropriate for pt's clinical situation. Dermatologic: No rashes or ulcers noted.  No cellulitis or open wounds. Lymph : No Cervical, Axillary, or Inguinal lymphadenopathy.   CBC Lab Results  Component Value Date   WBC 5.9 11/18/2015   HGB 9.0* 11/18/2015   HCT 28.0* 11/18/2015   MCV 82.7 11/18/2015    PLT 339 11/18/2015    BMET    Component Value Date/Time   NA 139 10/02/2015 0421   NA 143 05/04/2013 0614   K 4.3 10/02/2015 0421   K 3.8 05/04/2013 0614   CL 108 10/02/2015 0421   CL 110* 05/04/2013 0614   CO2 27 10/02/2015 0421   CO2 27 05/04/2013 0614   GLUCOSE 149* 10/02/2015 0421   GLUCOSE 130* 05/04/2013 0614   BUN 19 10/02/2015 0421   BUN  12 05/04/2013 0614   CREATININE 0.69 10/02/2015 0421   CREATININE 0.79 08/27/2014 1445   CALCIUM 8.1* 10/02/2015 0421   CALCIUM 8.2* 05/04/2013 0614   GFRNONAA >60 10/02/2015 0421   GFRNONAA >60 08/27/2014 1445   GFRAA >60 10/02/2015 0421   GFRAA >60 08/27/2014 1445   CrCl cannot be calculated (Patient has no serum creatinine result on file.).  COAG Lab Results  Component Value Date   INR 1.0 05/03/2013   INR 1.0 04/02/2013   INR 0.9 01/09/2013    Radiology No results found.    Assessment/Plan 1. Suspected giant cell arteritis.  Has had left eye visual symptoms for months.  Suspected stroke and previous CEA done.  Now has been seen by her eye doctor and felt to have giant cell arteritis.  Started on steroid.  Symptoms worse on the left eye, so will do left temporal artery biopsy for diagnosis 2. Carotid disease. S/p CEA 3. HTN. Stable 4. DM. Sugars will be increased by steroids.  Recommend she call her endocrinologist to discuss plan for treatment   Laverne Klugh, MD  11/19/2015 9:17 AM

## 2015-11-19 NOTE — Anesthesia Procedure Notes (Signed)
Procedure Name: LMA Insertion Date/Time: 11/19/2015 10:00 AM Performed by: Allean Found Pre-anesthesia Checklist: Patient identified, Emergency Drugs available, Suction available, Patient being monitored and Timeout performed Patient Re-evaluated:Patient Re-evaluated prior to inductionOxygen Delivery Method: Circle system utilized Preoxygenation: Pre-oxygenation with 100% oxygen Intubation Type: IV induction Ventilation: Mask ventilation without difficulty LMA: LMA inserted LMA Size: 4.0 Number of attempts: 1 Placement Confirmation: positive ETCO2 and breath sounds checked- equal and bilateral Tube secured with: Tape Dental Injury: Teeth and Oropharynx as per pre-operative assessment

## 2015-11-19 NOTE — Discharge Instructions (Signed)

## 2015-11-19 NOTE — Transfer of Care (Signed)
Immediate Anesthesia Transfer of Care Note  Patient: Joyce Potter  Procedure(s) Performed: Procedure(s): BIOPSY TEMPORAL ARTERY (Left)  Patient Location: PACU  Anesthesia Type:General  Level of Consciousness: sedated  Airway & Oxygen Therapy: Patient Spontanous Breathing and Patient connected to face mask oxygen  Post-op Assessment: Report given to RN and Post -op Vital signs reviewed and stable  Post vital signs: Reviewed and stable  Last Vitals:  Filed Vitals:   11/19/15 0803  BP: 169/80  Pulse: 74  Temp: 36.6 C  Resp: 18    Complications: No apparent anesthesia complications

## 2015-11-19 NOTE — Anesthesia Postprocedure Evaluation (Addendum)
Anesthesia Post Note  Patient: ANESHIA VILAS  Procedure(s) Performed: Procedure(s) (LRB): BIOPSY TEMPORAL ARTERY (Left)  Patient location during evaluation: PACU Anesthesia Type: General Level of consciousness: awake and alert Pain management: pain level controlled Vital Signs Assessment: post-procedure vital signs reviewed and stable Respiratory status: spontaneous breathing, nonlabored ventilation, respiratory function stable and patient connected to nasal cannula oxygen Cardiovascular status: blood pressure returned to baseline and stable Postop Assessment: No signs of nausea or vomiting (Patient does note a laceration/ulceration to the top lip.  I apologized for this and reminded that patient that this was a known complication we discussed about the LMA.  She agreed and confirmed that it was just bothersome. ) Anesthetic complications: no    Last Vitals:  Filed Vitals:   11/19/15 1143 11/19/15 1200  BP:  153/58  Pulse: 69 64  Temp:  35.7 C  Resp: 12 6    Last Pain:  Filed Vitals:   11/19/15 1203  PainSc: 4                  Martha Clan

## 2015-11-21 LAB — SURGICAL PATHOLOGY

## 2015-12-29 ENCOUNTER — Other Ambulatory Visit: Payer: Commercial Managed Care - HMO

## 2015-12-31 ENCOUNTER — Telehealth: Payer: Self-pay | Admitting: *Deleted

## 2015-12-31 ENCOUNTER — Inpatient Hospital Stay: Payer: Medicare HMO | Attending: Internal Medicine

## 2015-12-31 DIAGNOSIS — Z87891 Personal history of nicotine dependence: Secondary | ICD-10-CM | POA: Insufficient documentation

## 2015-12-31 DIAGNOSIS — E119 Type 2 diabetes mellitus without complications: Secondary | ICD-10-CM | POA: Insufficient documentation

## 2015-12-31 DIAGNOSIS — I10 Essential (primary) hypertension: Secondary | ICD-10-CM | POA: Insufficient documentation

## 2015-12-31 DIAGNOSIS — Z79899 Other long term (current) drug therapy: Secondary | ICD-10-CM | POA: Insufficient documentation

## 2015-12-31 DIAGNOSIS — E7800 Pure hypercholesterolemia, unspecified: Secondary | ICD-10-CM | POA: Diagnosis not present

## 2015-12-31 DIAGNOSIS — I2510 Atherosclerotic heart disease of native coronary artery without angina pectoris: Secondary | ICD-10-CM | POA: Insufficient documentation

## 2015-12-31 DIAGNOSIS — D509 Iron deficiency anemia, unspecified: Secondary | ICD-10-CM | POA: Diagnosis not present

## 2015-12-31 DIAGNOSIS — D649 Anemia, unspecified: Secondary | ICD-10-CM

## 2015-12-31 LAB — CBC WITH DIFFERENTIAL/PLATELET
Basophils Absolute: 0 10*3/uL (ref 0–0.1)
Basophils Relative: 1 %
EOS ABS: 0.1 10*3/uL (ref 0–0.7)
Eosinophils Relative: 3 %
HEMATOCRIT: 23.8 % — AB (ref 35.0–47.0)
HEMOGLOBIN: 7.5 g/dL — AB (ref 12.0–16.0)
LYMPHS PCT: 23 %
Lymphs Abs: 1.4 10*3/uL (ref 1.0–3.6)
MCH: 24.5 pg — AB (ref 26.0–34.0)
MCHC: 31.6 g/dL — AB (ref 32.0–36.0)
MCV: 77.7 fL — AB (ref 80.0–100.0)
MONO ABS: 0.5 10*3/uL (ref 0.2–0.9)
MONOS PCT: 9 %
Neutro Abs: 3.8 10*3/uL (ref 1.4–6.5)
Neutrophils Relative %: 64 %
Platelets: 360 10*3/uL (ref 150–440)
RBC: 3.07 MIL/uL — AB (ref 3.80–5.20)
RDW: 15.3 % — ABNORMAL HIGH (ref 11.5–14.5)
WBC: 5.9 10*3/uL (ref 3.6–11.0)

## 2015-12-31 LAB — IRON AND TIBC
Iron: 14 ug/dL — ABNORMAL LOW (ref 28–170)
SATURATION RATIOS: 4 % — AB (ref 10.4–31.8)
TIBC: 331 ug/dL (ref 250–450)
UIBC: 317 ug/dL

## 2015-12-31 LAB — FERRITIN: Ferritin: 8 ng/mL — ABNORMAL LOW (ref 11–307)

## 2015-12-31 NOTE — Telephone Encounter (Signed)
Patient called for lab results. Patient ID verification performed. Lab results reviewed with patient. Patient states that she is feeling "more tired and fatigue. I believe that I may need an iron infusion." last iron infusion received on 12/26/2014 last year. Iron sats have dropped to 4., ferritin is 8 and hgb is 7.5. She prefers to established with Dr. Grayland Ormond as the patient's husband is tx under Dr. Grayland Ormond. Spoke with Dr. Grayland Ormond. Md agreed fereheme may be indicated. md would like to see patient Msg sent to scheduling to arrange for MD/fereheme.

## 2016-01-08 ENCOUNTER — Inpatient Hospital Stay: Payer: Medicare HMO

## 2016-01-08 ENCOUNTER — Inpatient Hospital Stay (HOSPITAL_BASED_OUTPATIENT_CLINIC_OR_DEPARTMENT_OTHER): Payer: Medicare HMO | Admitting: Oncology

## 2016-01-08 VITALS — BP 138/68 | HR 70 | Temp 97.5°F | Resp 18

## 2016-01-08 VITALS — BP 165/64 | HR 98 | Temp 98.0°F | Wt 205.5 lb

## 2016-01-08 DIAGNOSIS — D500 Iron deficiency anemia secondary to blood loss (chronic): Secondary | ICD-10-CM

## 2016-01-08 DIAGNOSIS — Z79899 Other long term (current) drug therapy: Secondary | ICD-10-CM

## 2016-01-08 DIAGNOSIS — D509 Iron deficiency anemia, unspecified: Secondary | ICD-10-CM | POA: Diagnosis not present

## 2016-01-08 MED ORDER — SODIUM CHLORIDE 0.9 % IV SOLN
Freq: Once | INTRAVENOUS | Status: AC
  Administered 2016-01-08: 11:00:00 via INTRAVENOUS
  Filled 2016-01-08: qty 1000

## 2016-01-08 MED ORDER — SODIUM CHLORIDE 0.9 % IV SOLN
510.0000 mg | Freq: Once | INTRAVENOUS | Status: AC
  Administered 2016-01-08: 510 mg via INTRAVENOUS
  Filled 2016-01-08: qty 17

## 2016-01-09 NOTE — Progress Notes (Signed)
[place]  Telephone:(336) [phone] Fax:(336) [phone]  ID: Joyce Potter OB: Jan 04, 1946  MR#: [id number]  [id number]  Patient Care Team: Maryland Pink, MD as PCP - General (Family Medicine)  CHIEF COMPLAINT:  Chief Complaint  Patient presents with  . Anemia    follow up    INTERVAL HISTORY: Patient last evaluated in clinic by Dr. Ma Hillock greater than 2 years ago. She returns today to discuss her labwork and consideration of IV Feraheme. She has been feeling more weak and fatigued over the past several weeks, but also attributes this to caring for her husband who was recently placed on hospice. She otherwise feels well. She has no neurologic complaints. She denies any recent fevers. She has a fair appetite, but denies weight loss. She has no chest pain or shortness of breath. She denies any nausea, vomiting, constipation, or diarrhea. She has noted no melena or hematochezia. She has no urinary complaints. Patient otherwise feels well and offers no further specific complaints.   REVIEW OF SYSTEMS:   Review of Systems  Constitutional: Positive for malaise/fatigue. Negative for fever and weight loss.  Respiratory: Negative.  Negative for shortness of breath.   Cardiovascular: Negative.  Negative for chest pain.  Gastrointestinal: Negative.  Negative for blood in stool and melena.  Musculoskeletal: Negative.   Neurological: Positive for weakness.    As per HPI. Otherwise, a complete review of systems is negatve.  PAST MEDICAL HISTORY: Past Medical History  Diagnosis Date  . IDDM (insulin dependent diabetes mellitus) ([place])   . High cholesterol   . HTN (hypertension)   . Multiple thyroid nodules     Benign  . CAD (coronary artery disease)   . Cataract   . Peptic ulcer disease   . Aortic stenosis   . Hyperlipidemia   . Basal cell carcinoma   . Anemia   . Retinal artery occlusion     PAST SURGICAL HISTORY: Past Surgical History  Procedure  Laterality Date  . Coronary stent placement    . Partial hysterectomy    . Givens capsule study N/A 04/17/2013    Procedure: GIVENS CAPSULE STUDY;  Surgeon: Arta Silence, MD;  [facility]: [facility] ENDOSCOPY;  Service: Endoscopy;  Laterality: N/A;  patient ate breakfast at 7am   . Breast biopsy    . Coronary angioplasty    . Eye surgery    . Trigger finger release    . Cardiac catheterization N/A 08/08/2015    Procedure: Right and Left Heart Cath and Coronary Angiography;  Surgeon: Teodoro Spray, MD;  Location: [place] CV LAB;  Service: Cardiovascular;  Laterality: N/A;  . Endarterectomy Left 10/01/2015    Procedure: ENDARTERECTOMY CAROTID;  Surgeon: Algernon Huxley, MD;  Location: [facility] ORS;  Service: Vascular;  Laterality: Left;  . Cataract extraction w/ intraocular lens  implant, bilateral    . Artery biopsy Left 11/19/2015    Procedure: BIOPSY TEMPORAL ARTERY;  Surgeon: Algernon Huxley, MD;  Location: [facility] ORS;  Service: Vascular;  Laterality: Left;    FAMILY HISTORY Family History  Problem Relation Age of Onset  . Uterine cancer Mother   . Breast cancer Mother   . Colon cancer Neg Hx   . Seizures Father   . Stroke Father   . Diabetes Father   . COPD Father        ADVANCED DIRECTIVES:    HEALTH MAINTENANCE: Social History  Substance Use Topics  . Smoking status: Former Research scientist (life sciences)  . Smokeless tobacco: Not  on file  . Alcohol Use: No     Colonoscopy:  PAP:  Bone density:  Lipid panel:  Allergies  Allergen Reactions  . Aspirin Other (See Comments)    ulcers  . Invokamet [Canagliflozin-Metformin Hcl] Other (See Comments)    Yeast infection  . Sulfa Antibiotics Other (See Comments)    "Got Drunk"  . Lovastatin Rash  . Penicillins Rash  . Vicodin [Hydrocodone-Acetaminophen] Rash    Current Outpatient Prescriptions  Medication Sig Dispense Refill  . ascorbic acid (VITAMIN C) 500 MG tablet Take 500 mg by mouth daily.    . Biotin (RA BIOTIN) 1000 MCG tablet Take 1,000 mcg by  mouth daily.    . clopidogrel (PLAVIX) 75 MG tablet Take 1 tablet (75 mg total) by mouth daily with breakfast. 30 tablet 5  . ferrous sulfate 325 (65 FE) MG tablet Take 325 mg by mouth daily with breakfast.    . Flaxseed, Linseed, (FLAXSEED OIL) 1000 MG CAPS Take 1,000 mg by mouth daily.    Marland Kitchen GARLIC PO Take 1 capsule by mouth daily.    . insulin NPH-regular Human (NOVOLIN 70/30) (70-30) 100 UNIT/ML injection Inject into the skin 2 (two) times daily with a meal. 48U in am and 28U in pm    . losartan-hydrochlorothiazide (HYZAAR) 100-25 MG per tablet Take 1 tablet by mouth daily.    . metoprolol (LOPRESSOR) 100 MG tablet Take 100 mg by mouth 2 (two) times daily.    . Multiple Vitamins-Minerals (MULTIVITAMIN WITH MINERALS) tablet Take 1 tablet by mouth daily.    Marland Kitchen omeprazole (PRILOSEC) 40 MG capsule Take 40 mg by mouth daily.    . pantoprazole (PROTONIX) 40 MG tablet Take 40 mg by mouth daily.    . pravastatin (PRAVACHOL) 10 MG tablet Take 1 tablet (10 mg total) by mouth daily at 6 PM. 30 tablet 0  . predniSONE (DELTASONE) 20 MG tablet Take 20 mg by mouth 3 (three) times daily.    . traMADol (ULTRAM) 50 MG tablet Take 1 tablet (50 mg total) by mouth every 6 (six) hours as needed for moderate pain. 30 tablet 0   No current facility-administered medications for this visit.    OBJECTIVE: Filed Vitals:   01/08/16 0956  BP: 165/64  Pulse: 98  Temp: 98 F (36.7 C)     Body mass index is 36.41 kg/(m^2).    ECOG FS:0 - Asymptomatic  General: Well-developed, well-nourished, no acute distress. Eyes: Pink conjunctiva, anicteric sclera. HEENT: Normocephalic, moist mucous membranes, clear oropharnyx. Lungs: Clear to auscultation bilaterally. Heart: Regular rate and rhythm. No rubs, murmurs, or gallops. Abdomen: Soft, nontender, nondistended. No organomegaly noted, normoactive bowel sounds. Musculoskeletal: No edema, cyanosis, or clubbing. Neuro: Alert, answering all questions appropriately. Cranial  nerves grossly intact. Skin: No rashes or petechiae noted. Psych: Normal affect. Lymphatics: No cervical, calvicular, axillary or inguinal LAD.   LAB RESULTS:  Lab Results  Component Value Date   NA 136 11/19/2015   K 4.7 11/19/2015   CL 103 11/19/2015   CO2 27 11/19/2015   GLUCOSE 233* 11/19/2015   BUN 22* 11/19/2015   CREATININE 0.87 11/19/2015   CALCIUM 9.7 11/19/2015   PROT 7.5 07/14/2015   ALBUMIN 4.0 07/14/2015   AST 25 07/14/2015   ALT 25 07/14/2015   ALKPHOS 80 07/14/2015   BILITOT 0.6 07/14/2015   GFRNONAA >60 11/19/2015   GFRAA >60 11/19/2015    Lab Results  Component Value Date   WBC 5.9 12/31/2015   NEUTROABS 3.8  12/31/2015   HGB 7.5* 12/31/2015   HCT 23.8* 12/31/2015   MCV 77.7* 12/31/2015   PLT 360 12/31/2015     STUDIES: No results found.  ASSESSMENT: iron deficiency anemia.  PLAN:    1. Iron deficiency anemia: Patient's hemoglobin and iron stores have significantly declined and she is symptomatic. Previously, her entire anemia workup was reported as either negative or within normal limits. Proceed with 510 mg IV Feraheme today. Return to clinic in 1 week for a second infusion and then in 2 months with repeat laboratory work and further evaluation.  2. Hypertension: Patient's blood pressure is elevated today, continue current medications as prescribed. Proceed with IV iron as above.  Approximately 30 minutes was spent in discussion of which greater than 50% was consultation.  Patient expressed understanding and was in agreement with this plan. She also understands that She can call clinic at any time with any questions, concerns, or complaints.    Lloyd Huger, MD   01/09/2016 12:39 PM

## 2016-01-15 ENCOUNTER — Inpatient Hospital Stay: Payer: Medicare HMO

## 2016-01-15 VITALS — BP 121/78 | HR 62 | Temp 98.3°F | Resp 18

## 2016-01-15 DIAGNOSIS — D500 Iron deficiency anemia secondary to blood loss (chronic): Secondary | ICD-10-CM

## 2016-01-15 DIAGNOSIS — D509 Iron deficiency anemia, unspecified: Secondary | ICD-10-CM | POA: Diagnosis not present

## 2016-01-15 MED ORDER — SODIUM CHLORIDE 0.9 % IV SOLN
510.0000 mg | Freq: Once | INTRAVENOUS | Status: AC
  Administered 2016-01-15: 510 mg via INTRAVENOUS
  Filled 2016-01-15: qty 17

## 2016-01-15 MED ORDER — SODIUM CHLORIDE 0.9 % IV SOLN
Freq: Once | INTRAVENOUS | Status: AC
  Administered 2016-01-15: 14:00:00 via INTRAVENOUS
  Filled 2016-01-15: qty 1000

## 2016-01-20 ENCOUNTER — Telehealth: Payer: Self-pay | Admitting: *Deleted

## 2016-01-20 ENCOUNTER — Inpatient Hospital Stay: Payer: Medicare HMO

## 2016-01-20 DIAGNOSIS — D509 Iron deficiency anemia, unspecified: Secondary | ICD-10-CM | POA: Diagnosis not present

## 2016-01-20 DIAGNOSIS — D500 Iron deficiency anemia secondary to blood loss (chronic): Secondary | ICD-10-CM

## 2016-01-20 LAB — FERRITIN: Ferritin: 456 ng/mL — ABNORMAL HIGH (ref 11–307)

## 2016-01-20 LAB — CBC WITH DIFFERENTIAL/PLATELET
BASOS ABS: 0 10*3/uL (ref 0–0.1)
BASOS PCT: 1 %
EOS ABS: 0.2 10*3/uL (ref 0–0.7)
Eosinophils Relative: 3 %
HEMATOCRIT: 29.4 % — AB (ref 35.0–47.0)
Hemoglobin: 9.4 g/dL — ABNORMAL LOW (ref 12.0–16.0)
Lymphocytes Relative: 15 %
Lymphs Abs: 1.1 10*3/uL (ref 1.0–3.6)
MCH: 26 pg (ref 26.0–34.0)
MCHC: 31.9 g/dL — AB (ref 32.0–36.0)
MCV: 81.7 fL (ref 80.0–100.0)
MONO ABS: 0.6 10*3/uL (ref 0.2–0.9)
MONOS PCT: 9 %
NEUTROS ABS: 5.1 10*3/uL (ref 1.4–6.5)
NEUTROS PCT: 72 %
PLATELETS: 308 10*3/uL (ref 150–440)
RBC: 3.6 MIL/uL — ABNORMAL LOW (ref 3.80–5.20)
RDW: 22.3 % — AB (ref 11.5–14.5)
WBC: 7 10*3/uL (ref 3.6–11.0)

## 2016-01-20 LAB — IRON AND TIBC
Iron: 51 ug/dL (ref 28–170)
SATURATION RATIOS: 18 % (ref 10.4–31.8)
TIBC: 291 ug/dL (ref 250–450)
UIBC: 240 ug/dL

## 2016-01-20 LAB — SAMPLE TO BLOOD BANK

## 2016-01-20 NOTE — Telephone Encounter (Signed)
CBC, Extra tube, IIBC, Ferritin order per VO Dr Grayland Ormond pt agrees to 315 appt today

## 2016-01-20 NOTE — Telephone Encounter (Signed)
Asking if she can come in to have her labs checked as her heart is racing and she feels like her blood is low

## 2016-01-25 DIAGNOSIS — E042 Nontoxic multinodular goiter: Secondary | ICD-10-CM | POA: Insufficient documentation

## 2016-03-08 ENCOUNTER — Inpatient Hospital Stay: Payer: Medicare HMO | Attending: Oncology

## 2016-03-08 ENCOUNTER — Inpatient Hospital Stay (HOSPITAL_BASED_OUTPATIENT_CLINIC_OR_DEPARTMENT_OTHER): Payer: Medicare HMO | Admitting: Oncology

## 2016-03-08 ENCOUNTER — Inpatient Hospital Stay: Payer: Medicare HMO

## 2016-03-08 VITALS — BP 121/68 | HR 80 | Temp 98.2°F | Resp 16

## 2016-03-08 DIAGNOSIS — Z794 Long term (current) use of insulin: Secondary | ICD-10-CM | POA: Insufficient documentation

## 2016-03-08 DIAGNOSIS — Z87891 Personal history of nicotine dependence: Secondary | ICD-10-CM | POA: Diagnosis not present

## 2016-03-08 DIAGNOSIS — D509 Iron deficiency anemia, unspecified: Secondary | ICD-10-CM | POA: Diagnosis present

## 2016-03-08 DIAGNOSIS — I2510 Atherosclerotic heart disease of native coronary artery without angina pectoris: Secondary | ICD-10-CM | POA: Insufficient documentation

## 2016-03-08 DIAGNOSIS — Z955 Presence of coronary angioplasty implant and graft: Secondary | ICD-10-CM | POA: Diagnosis not present

## 2016-03-08 DIAGNOSIS — Z79899 Other long term (current) drug therapy: Secondary | ICD-10-CM | POA: Diagnosis not present

## 2016-03-08 DIAGNOSIS — I10 Essential (primary) hypertension: Secondary | ICD-10-CM | POA: Insufficient documentation

## 2016-03-08 DIAGNOSIS — E119 Type 2 diabetes mellitus without complications: Secondary | ICD-10-CM | POA: Insufficient documentation

## 2016-03-08 DIAGNOSIS — E7800 Pure hypercholesterolemia, unspecified: Secondary | ICD-10-CM | POA: Insufficient documentation

## 2016-03-08 LAB — CBC WITH DIFFERENTIAL/PLATELET
Basophils Absolute: 0 10*3/uL (ref 0–0.1)
Basophils Relative: 1 %
EOS ABS: 0.3 10*3/uL (ref 0–0.7)
Eosinophils Relative: 6 %
HCT: 30.4 % — ABNORMAL LOW (ref 35.0–47.0)
HEMOGLOBIN: 10.2 g/dL — AB (ref 12.0–16.0)
LYMPHS ABS: 1.4 10*3/uL (ref 1.0–3.6)
Lymphocytes Relative: 27 %
MCH: 28.6 pg (ref 26.0–34.0)
MCHC: 33.6 g/dL (ref 32.0–36.0)
MCV: 85 fL (ref 80.0–100.0)
Monocytes Absolute: 0.4 10*3/uL (ref 0.2–0.9)
Monocytes Relative: 9 %
NEUTROS ABS: 2.9 10*3/uL (ref 1.4–6.5)
NEUTROS PCT: 57 %
Platelets: 275 10*3/uL (ref 150–440)
RBC: 3.58 MIL/uL — AB (ref 3.80–5.20)
RDW: 17.4 % — ABNORMAL HIGH (ref 11.5–14.5)
WBC: 5.1 10*3/uL (ref 3.6–11.0)

## 2016-03-08 LAB — FERRITIN: Ferritin: 22 ng/mL (ref 11–307)

## 2016-03-08 LAB — IRON AND TIBC
Iron: 22 ug/dL — ABNORMAL LOW (ref 28–170)
Saturation Ratios: 7 % — ABNORMAL LOW (ref 10.4–31.8)
TIBC: 321 ug/dL (ref 250–450)
UIBC: 299 ug/dL

## 2016-03-08 NOTE — Progress Notes (Signed)
Patient has been feeling more fatigued.

## 2016-03-14 NOTE — Progress Notes (Signed)
[place]  Telephone:(336) [phone] Fax:(336) [phone]  ID: Joyce Potter OB: 1946-08-01  MR#: [id number]  [id number]  Patient Care Team: Maryland Pink, MD as PCP - General (Family Medicine)  CHIEF COMPLAINT:  Chief Complaint  Patient presents with  . Anemia    INTERVAL HISTORY: Patient returns to clinic today for repeat laboratory work and further evaluation. 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 She otherwise feels well. She has no neurologic complaints. She denies any recent fevers. She has a fair appetite, but denies weight loss. She has no chest pain or shortness of breath. She denies any nausea, vomiting, constipation, or diarrhea. She has noted no melena or hematochezia. She has no urinary complaints. Patient otherwise feels well and offers no further specific complaints.   REVIEW OF SYSTEMS:   Review of Systems  Constitutional: Positive for malaise/fatigue. Negative for fever and weight loss.  Respiratory: Negative.  Negative for shortness of breath.   Cardiovascular: Negative.  Negative for chest pain.  Gastrointestinal: Negative.  Negative for blood in stool and melena.  Musculoskeletal: Negative.   Neurological: Negative.     As per HPI. Otherwise, a complete review of systems is negatve.  PAST MEDICAL HISTORY: Past Medical History  Diagnosis Date  . IDDM (insulin dependent diabetes mellitus) ([place])   . High cholesterol   . HTN (hypertension)   . Multiple thyroid nodules     Benign  . CAD (coronary artery disease)   . Cataract   . Peptic ulcer disease   . Aortic stenosis   . Hyperlipidemia   . Basal cell carcinoma   . Anemia   . Retinal artery occlusion     PAST SURGICAL HISTORY: Past Surgical History  Procedure Laterality Date  . Coronary stent placement    . Partial hysterectomy    . Givens capsule study N/A  04/17/2013    Procedure: GIVENS CAPSULE STUDY;  Surgeon: Arta Silence, MD;  Location: [facility] ENDOSCOPY;  Service: Endoscopy;  Laterality: N/A;  patient ate breakfast at 7am   . Breast biopsy    . Coronary angioplasty    . Eye surgery    . Trigger finger release    . Cardiac catheterization N/A 08/08/2015    Procedure: Right and Left Heart Cath and Coronary Angiography;  Surgeon: Teodoro Spray, MD;  Location: [place] CV LAB;  Service: Cardiovascular;  Laterality: N/A;  . Endarterectomy Left 10/01/2015    Procedure: ENDARTERECTOMY CAROTID;  Surgeon: Algernon Huxley, MD;  Location: [facility] ORS;  Service: Vascular;  Laterality: Left;  . Cataract extraction w/ intraocular lens  implant, bilateral    . Artery biopsy Left 11/19/2015    Procedure: BIOPSY TEMPORAL ARTERY;  Surgeon: Algernon Huxley, MD;  Location: [facility] ORS;  Service: Vascular;  Laterality: Left;    FAMILY HISTORY Family History  Problem Relation Age of Onset  . Uterine cancer Mother   . Breast cancer Mother   . Colon cancer Neg Hx   . Seizures Father   . Stroke Father   . Diabetes Father   . COPD Father        ADVANCED DIRECTIVES:    HEALTH MAINTENANCE: Social History  Substance Use Topics  . Smoking status: Former Research scientist (life sciences)  . Smokeless tobacco: Not on file  . Alcohol Use: No     Colonoscopy:  PAP:  Bone density:  Lipid panel:  Allergies  Allergen Reactions  . Aspirin Other (See Comments)    ulcers  . Invokamet [Canagliflozin-Metformin Hcl] Other (See Comments)    Yeast infection  . Sulfa Antibiotics Other (See Comments)    "Got Drunk"  . Lovastatin Rash  . Penicillins Rash  . Vicodin [Hydrocodone-Acetaminophen] Rash    Current Outpatient Prescriptions  Medication Sig Dispense Refill  . ascorbic acid (VITAMIN C) 500 MG tablet Take 500 mg by mouth daily.    . Biotin (RA BIOTIN) 1000 MCG tablet Take 1,000 mcg by mouth daily.    . clopidogrel (PLAVIX) 75 MG tablet Take 1 tablet (75 mg total) by mouth daily with  breakfast. 30 tablet 5  . ferrous sulfate 325 (65 FE) MG tablet Take 325 mg by mouth daily with breakfast.    . Flaxseed, Linseed, (FLAXSEED OIL) 1000 MG CAPS Take 1,000 mg by mouth daily.    Marland Kitchen GARLIC PO Take 1 capsule by mouth daily.    . insulin NPH-regular Human (NOVOLIN 70/30) (70-30) 100 UNIT/ML injection Inject into the skin 2 (two) times daily with a meal. 48U in am and 28U in pm    . losartan-hydrochlorothiazide (HYZAAR) 100-25 MG per tablet Take 1 tablet by mouth daily.    . metoprolol (LOPRESSOR) 100 MG tablet Take 100 mg by mouth 2 (two) times daily.    . Multiple Vitamins-Minerals (MULTIVITAMIN WITH MINERALS) tablet Take 1 tablet by mouth daily.    Marland Kitchen omeprazole (PRILOSEC) 40 MG capsule Take 40 mg by mouth daily.    . pravastatin (PRAVACHOL) 10 MG tablet Take 1 tablet (10 mg total) by mouth daily at 6 PM. 30 tablet 0  . predniSONE (DELTASONE) 20 MG tablet Take 20 mg by mouth 3 (three) times daily.    . traMADol (ULTRAM) 50 MG tablet Take 1 tablet (50 mg total) by mouth every 6 (six) hours as needed for moderate pain. 30 tablet 0   No current facility-administered medications for this visit.    OBJECTIVE: Filed Vitals:   03/08/16 1353  BP: 121/68  Pulse: 80  Temp: 98.2 F (36.8 C)  Resp: 16     There is no weight on file to calculate BMI.    ECOG FS:0 - Asymptomatic  General: Well-developed, well-nourished, no acute distress. Eyes: Pink conjunctiva, anicteric sclera. Lungs: Clear to auscultation bilaterally. Heart: Regular rate and rhythm. No rubs, murmurs, or gallops. Abdomen: Soft, nontender, nondistended. No organomegaly noted, normoactive bowel sounds. Musculoskeletal: No edema, cyanosis, or clubbing. Neuro: Alert, answering all questions appropriately. Cranial nerves grossly intact. Skin: No rashes or petechiae noted. Psych: Normal affect.   LAB RESULTS:  Lab Results  Component Value Date   NA 136 11/19/2015   K 4.7 11/19/2015   CL 103 11/19/2015   CO2 27  11/19/2015   GLUCOSE 233* 11/19/2015   BUN 22* 11/19/2015   CREATININE 0.87 11/19/2015   CALCIUM 9.7 11/19/2015   PROT 7.5 07/14/2015   ALBUMIN 4.0 07/14/2015   AST 25 07/14/2015   ALT 25 07/14/2015   ALKPHOS 80 07/14/2015   BILITOT 0.6 07/14/2015   GFRNONAA >60 11/19/2015   GFRAA >60 11/19/2015    Lab Results  Component Value Date   WBC 5.1 03/08/2016   NEUTROABS 2.9 03/08/2016   HGB 10.2* 03/08/2016   HCT 30.4* 03/08/2016   MCV 85.0 03/08/2016   PLT 275 03/08/2016     STUDIES: No results found.  ASSESSMENT: Iron deficiency anemia.  PLAN:    1. Iron deficiency anemia: Patient's hemoglobin is slightly  improved, but her iron stores are trending down and she is symptomatic. Patient had to leave clinic with her husband, but have recommended she return to clinic in 1 and 2 weeks to receive 510 mg of IV Feraheme. She will then return to clinic since 4 months with repeat laboratory work and further evaluation.   2. Hypertension: Patient's blood pressure is within normal limits today, continue current medications as prescribed.    Patient expressed understanding and was in agreement with this plan. She also understands that She can call clinic at any time with any questions, concerns, or complaints.    Lloyd Huger, MD   03/14/2016 9:58 AM

## 2016-03-22 ENCOUNTER — Ambulatory Visit: Payer: Commercial Managed Care - HMO | Admitting: Internal Medicine

## 2016-03-22 ENCOUNTER — Other Ambulatory Visit: Payer: Commercial Managed Care - HMO

## 2016-03-25 ENCOUNTER — Inpatient Hospital Stay: Payer: Medicare HMO

## 2016-03-25 VITALS — BP 115/74 | HR 68 | Temp 96.6°F | Resp 20

## 2016-03-25 DIAGNOSIS — D500 Iron deficiency anemia secondary to blood loss (chronic): Secondary | ICD-10-CM

## 2016-03-25 DIAGNOSIS — D509 Iron deficiency anemia, unspecified: Secondary | ICD-10-CM | POA: Diagnosis not present

## 2016-03-25 MED ORDER — SODIUM CHLORIDE 0.9 % IV SOLN
510.0000 mg | Freq: Once | INTRAVENOUS | Status: AC
  Administered 2016-03-25: 510 mg via INTRAVENOUS
  Filled 2016-03-25: qty 17

## 2016-03-25 MED ORDER — SODIUM CHLORIDE 0.9 % IV SOLN
Freq: Once | INTRAVENOUS | Status: AC
  Administered 2016-03-25: 14:00:00 via INTRAVENOUS
  Filled 2016-03-25: qty 1000

## 2016-05-26 ENCOUNTER — Telehealth: Payer: Self-pay

## 2016-05-26 NOTE — Telephone Encounter (Signed)
Received a fax from Dr. Barbarann Ehlers office requesting Dr. Grayland Ormond to review labs.  When she saw Dr. Kary Kos she was having fatigue:

## 2016-05-26 NOTE — Telephone Encounter (Signed)
Schedule message entered.

## 2016-05-26 NOTE — Telephone Encounter (Signed)
She can come in in the next 1-2 weeks for MD visit and feraheme.  Thanks!

## 2016-05-29 ENCOUNTER — Emergency Department: Payer: Medicare HMO

## 2016-05-29 ENCOUNTER — Inpatient Hospital Stay
Admission: EM | Admit: 2016-05-29 | Discharge: 2016-06-01 | DRG: 379 | Disposition: A | Discharge status: Home / Self Care | Payer: Medicare HMO | Attending: Internal Medicine | Admitting: Internal Medicine

## 2016-05-29 ENCOUNTER — Encounter: Payer: Self-pay | Admitting: Emergency Medicine

## 2016-05-29 DIAGNOSIS — Z7901 Long term (current) use of anticoagulants: Secondary | ICD-10-CM | POA: Diagnosis not present

## 2016-05-29 DIAGNOSIS — K921 Melena: Principal | ICD-10-CM | POA: Diagnosis present

## 2016-05-29 DIAGNOSIS — I2510 Atherosclerotic heart disease of native coronary artery without angina pectoris: Secondary | ICD-10-CM | POA: Diagnosis present

## 2016-05-29 DIAGNOSIS — I252 Old myocardial infarction: Secondary | ICD-10-CM

## 2016-05-29 DIAGNOSIS — Z803 Family history of malignant neoplasm of breast: Secondary | ICD-10-CM | POA: Diagnosis not present

## 2016-05-29 DIAGNOSIS — D500 Iron deficiency anemia secondary to blood loss (chronic): Secondary | ICD-10-CM | POA: Diagnosis present

## 2016-05-29 DIAGNOSIS — Z823 Family history of stroke: Secondary | ICD-10-CM

## 2016-05-29 DIAGNOSIS — Z955 Presence of coronary angioplasty implant and graft: Secondary | ICD-10-CM | POA: Diagnosis not present

## 2016-05-29 DIAGNOSIS — Z8673 Personal history of transient ischemic attack (TIA), and cerebral infarction without residual deficits: Secondary | ICD-10-CM | POA: Diagnosis not present

## 2016-05-29 DIAGNOSIS — Z8711 Personal history of peptic ulcer disease: Secondary | ICD-10-CM | POA: Diagnosis not present

## 2016-05-29 DIAGNOSIS — Z87891 Personal history of nicotine dependence: Secondary | ICD-10-CM | POA: Diagnosis not present

## 2016-05-29 DIAGNOSIS — I10 Essential (primary) hypertension: Secondary | ICD-10-CM | POA: Diagnosis present

## 2016-05-29 DIAGNOSIS — E1165 Type 2 diabetes mellitus with hyperglycemia: Secondary | ICD-10-CM | POA: Diagnosis present

## 2016-05-29 DIAGNOSIS — I739 Peripheral vascular disease, unspecified: Secondary | ICD-10-CM | POA: Diagnosis present

## 2016-05-29 DIAGNOSIS — Z825 Family history of asthma and other chronic lower respiratory diseases: Secondary | ICD-10-CM

## 2016-05-29 DIAGNOSIS — Z794 Long term (current) use of insulin: Secondary | ICD-10-CM | POA: Diagnosis not present

## 2016-05-29 DIAGNOSIS — E782 Mixed hyperlipidemia: Secondary | ICD-10-CM | POA: Diagnosis present

## 2016-05-29 DIAGNOSIS — Z85828 Personal history of other malignant neoplasm of skin: Secondary | ICD-10-CM | POA: Diagnosis not present

## 2016-05-29 DIAGNOSIS — D649 Anemia, unspecified: Secondary | ICD-10-CM

## 2016-05-29 DIAGNOSIS — Z833 Family history of diabetes mellitus: Secondary | ICD-10-CM

## 2016-05-29 DIAGNOSIS — Z79899 Other long term (current) drug therapy: Secondary | ICD-10-CM | POA: Diagnosis not present

## 2016-05-29 DIAGNOSIS — Z885 Allergy status to narcotic agent status: Secondary | ICD-10-CM

## 2016-05-29 DIAGNOSIS — Z888 Allergy status to other drugs, medicaments and biological substances status: Secondary | ICD-10-CM

## 2016-05-29 DIAGNOSIS — Z880 Allergy status to penicillin: Secondary | ICD-10-CM

## 2016-05-29 DIAGNOSIS — Z8049 Family history of malignant neoplasm of other genital organs: Secondary | ICD-10-CM | POA: Diagnosis not present

## 2016-05-29 DIAGNOSIS — R079 Chest pain, unspecified: Secondary | ICD-10-CM

## 2016-05-29 DIAGNOSIS — K625 Hemorrhage of anus and rectum: Secondary | ICD-10-CM

## 2016-05-29 DIAGNOSIS — I350 Nonrheumatic aortic (valve) stenosis: Secondary | ICD-10-CM | POA: Diagnosis present

## 2016-05-29 HISTORY — DX: Occlusion and stenosis of unspecified carotid artery: I65.29

## 2016-05-29 LAB — COMPREHENSIVE METABOLIC PANEL
ALK PHOS: 71 U/L (ref 38–126)
ALT: 23 U/L (ref 14–54)
ANION GAP: 12 (ref 5–15)
AST: 38 U/L (ref 15–41)
Albumin: 3.9 g/dL (ref 3.5–5.0)
BILIRUBIN TOTAL: 0.5 mg/dL (ref 0.3–1.2)
BUN: 39 mg/dL — ABNORMAL HIGH (ref 6–20)
CALCIUM: 8.9 mg/dL (ref 8.9–10.3)
CO2: 20 mmol/L — ABNORMAL LOW (ref 22–32)
CREATININE: 1.21 mg/dL — AB (ref 0.44–1.00)
Chloride: 105 mmol/L (ref 101–111)
GFR calc non Af Amer: 45 mL/min — ABNORMAL LOW (ref >60)
GFR, EST AFRICAN AMERICAN: 52 mL/min — AB (ref >60)
Glucose, Bld: 262 mg/dL — ABNORMAL HIGH (ref 65–99)
Potassium: 4.3 mmol/L (ref 3.5–5.1)
Sodium: 137 mmol/L (ref 135–145)
TOTAL PROTEIN: 6.9 g/dL (ref 6.5–8.1)

## 2016-05-29 LAB — PREPARE RBC (CROSSMATCH)

## 2016-05-29 LAB — CBC
HEMATOCRIT: 23.1 % — AB (ref 35.0–47.0)
HEMOGLOBIN: 7.6 g/dL — AB (ref 12.0–16.0)
MCH: 28 pg (ref 26.0–34.0)
MCHC: 32.9 g/dL (ref 32.0–36.0)
MCV: 85.1 fL (ref 80.0–100.0)
Platelets: 300 10*3/uL (ref 150–440)
RBC: 2.71 MIL/uL — AB (ref 3.80–5.20)
RDW: 14.8 % — ABNORMAL HIGH (ref 11.5–14.5)
WBC: 5.6 10*3/uL (ref 3.6–11.0)

## 2016-05-29 LAB — FERRITIN: FERRITIN: 13 ng/mL (ref 11–307)

## 2016-05-29 LAB — VITAMIN B12: Vitamin B-12: 590 pg/mL (ref 180–914)

## 2016-05-29 LAB — IRON AND TIBC
IRON: 22 ug/dL — AB (ref 28–170)
Saturation Ratios: 6 % — ABNORMAL LOW (ref 10.4–31.8)
TIBC: 353 ug/dL (ref 250–450)
UIBC: 331 ug/dL

## 2016-05-29 LAB — TROPONIN I: Troponin I: <0.03 ng/mL (ref <0.031)

## 2016-05-29 LAB — RETICULOCYTES
RBC.: 2.72 MIL/uL — ABNORMAL LOW (ref 3.80–5.20)
RETIC COUNT ABSOLUTE: 81.6 10*3/uL (ref 19.0–183.0)
RETIC CT PCT: 3 % (ref 0.4–3.1)

## 2016-05-29 LAB — FOLATE: Folate: 81 ng/mL (ref >5.9)

## 2016-05-29 LAB — LIPASE, BLOOD: LIPASE: 48 U/L (ref 11–51)

## 2016-05-29 LAB — GLUCOSE, CAPILLARY: Glucose-Capillary: 294 mg/dL — ABNORMAL HIGH (ref 65–99)

## 2016-05-29 LAB — HEMOGLOBIN: HEMOGLOBIN: 7.6 g/dL — AB (ref 12.0–16.0)

## 2016-05-29 LAB — BRAIN NATRIURETIC PEPTIDE: B NATRIURETIC PEPTIDE 5: 66 pg/mL (ref 0.0–100.0)

## 2016-05-29 MED ORDER — INSULIN ASPART 100 UNIT/ML ~~LOC~~ SOLN
0.0000 [IU] | Freq: Three times a day (TID) | SUBCUTANEOUS | Status: DC
  Administered 2016-05-30 (×4): 2 [IU] via SUBCUTANEOUS
  Administered 2016-05-31: 3 [IU] via SUBCUTANEOUS
  Administered 2016-06-01: 2 [IU] via SUBCUTANEOUS
  Filled 2016-05-29: qty 2
  Filled 2016-05-29: qty 3
  Filled 2016-05-29 (×3): qty 2

## 2016-05-29 MED ORDER — CIPROFLOXACIN HCL 250 MG PO TABS
250.0000 mg | ORAL_TABLET | Freq: Two times a day (BID) | ORAL | Status: DC
Start: 2016-05-29 — End: 2016-06-01
  Administered 2016-05-29 – 2016-06-01 (×6): 250 mg via ORAL
  Filled 2016-05-29 (×7): qty 1

## 2016-05-29 MED ORDER — ONDANSETRON HCL 4 MG PO TABS: 4.0000 mg | ORAL_TABLET | Freq: Four times a day (QID) | ORAL | Status: DC | PRN

## 2016-05-29 MED ORDER — ONDANSETRON HCL 4 MG/2ML IJ SOLN: 4.0000 mg | Freq: Four times a day (QID) | INTRAMUSCULAR | Status: DC | PRN

## 2016-05-29 MED ORDER — ACETAMINOPHEN 325 MG PO TABS: 650.0000 mg | ORAL_TABLET | Freq: Four times a day (QID) | ORAL | Status: DC | PRN

## 2016-05-29 MED ORDER — ACETAMINOPHEN 650 MG RE SUPP: 650.0000 mg | Freq: Four times a day (QID) | RECTAL | Status: DC | PRN

## 2016-05-29 MED ORDER — VITAMIN C 500 MG PO TABS
500.0000 mg | ORAL_TABLET | Freq: Every day | ORAL | Status: DC
  Administered 2016-05-30 – 2016-06-01 (×2): 500 mg via ORAL
  Filled 2016-05-29 (×3): qty 1

## 2016-05-29 MED ORDER — SODIUM CHLORIDE 0.9% FLUSH
3.0000 mL | Freq: Two times a day (BID) | INTRAVENOUS | Status: DC
  Administered 2016-05-29 – 2016-06-01 (×4): 3 mL via INTRAVENOUS

## 2016-05-29 MED ORDER — ADULT MULTIVITAMIN W/MINERALS CH
1.0000 | ORAL_TABLET | Freq: Every day | ORAL | Status: DC
  Administered 2016-05-30 – 2016-06-01 (×2): 1 via ORAL
  Filled 2016-05-29 (×3): qty 1

## 2016-05-29 MED ORDER — DOCUSATE SODIUM 100 MG PO CAPS
100.0000 mg | ORAL_CAPSULE | Freq: Two times a day (BID) | ORAL | Status: DC
  Administered 2016-05-29 – 2016-06-01 (×4): 100 mg via ORAL
  Filled 2016-05-29 (×5): qty 1

## 2016-05-29 MED ORDER — BIOTIN 1000 MCG PO TABS: 1000.0000 ug | ORAL_TABLET | Freq: Every day | ORAL | Status: DC

## 2016-05-29 MED ORDER — INSULIN ASPART PROT & ASPART (70-30 MIX) 100 UNIT/ML ~~LOC~~ SUSP
55.0000 [IU] | Freq: Two times a day (BID) | SUBCUTANEOUS | Status: DC
  Administered 2016-05-30 – 2016-05-31 (×3): 55 [IU] via SUBCUTANEOUS
  Filled 2016-05-29 (×3): qty 55

## 2016-05-29 MED ORDER — PANTOPRAZOLE SODIUM 40 MG PO TBEC
40.0000 mg | DELAYED_RELEASE_TABLET | Freq: Two times a day (BID) | ORAL | Status: DC
  Administered 2016-05-30 – 2016-06-01 (×5): 40 mg via ORAL
  Filled 2016-05-29 (×4): qty 1

## 2016-05-29 MED ORDER — PRAVASTATIN SODIUM 10 MG PO TABS
10.0000 mg | ORAL_TABLET | Freq: Every day | ORAL | Status: DC
  Administered 2016-05-31: 10 mg via ORAL
  Filled 2016-05-29: qty 1

## 2016-05-29 MED ORDER — INSULIN ASPART 100 UNIT/ML ~~LOC~~ SOLN
0.0000 [IU] | Freq: Every day | SUBCUTANEOUS | Status: DC
  Administered 2016-05-29: 3 [IU] via SUBCUTANEOUS
  Filled 2016-05-29: qty 3

## 2016-05-29 MED ORDER — SODIUM CHLORIDE 0.9 % IV SOLN
INTRAVENOUS | Status: AC
  Administered 2016-05-29: 21:00:00 via INTRAVENOUS

## 2016-05-29 MED ORDER — NITROGLYCERIN 2 % TD OINT: 1.0000 [in_us] | TOPICAL_OINTMENT | Freq: Once | TRANSDERMAL | Status: DC

## 2016-05-29 MED ORDER — SODIUM CHLORIDE 0.9 % IV SOLN
10.0000 mL/h | Freq: Once | INTRAVENOUS | Status: AC
  Administered 2016-05-29: 10 mL/h via INTRAVENOUS

## 2016-05-29 MED ORDER — TRAMADOL HCL 50 MG PO TABS: 50.0000 mg | ORAL_TABLET | Freq: Four times a day (QID) | ORAL | Status: DC | PRN

## 2016-05-29 NOTE — Consult Note (Signed)
Patient seen by me in past, has significant vascular disease, carotid stenosis, aortic stenosis, stents to coronary artery, previous CVA.  She has been back on Plavix and her stools have become darker and she had spell this morning suggesting  Angina.  Her hgb was 7.4  With repeat the same.  She is now going to get a unit of blood.  She has had colonoscopy, EGD, and capsule but this was a few years ago.  She may need an EGD, her BUN: creatinine ratio suggests this was an UGI bleed.  Will hold off final decision on EGD until tomorrow.

## 2016-05-29 NOTE — ED Notes (Signed)
Chest pain began yesterday, increasing, stated history MI with same feeling.

## 2016-05-29 NOTE — ED Provider Notes (Signed)
[facility] Emergency Department Provider Note        Time seen: ----------------------------------------- 2:10 PM on 05/29/2016 -----------------------------------------    I have reviewed the triage vital signs and the nursing notes.   HISTORY  Chief Complaint Chest Pain    HPI Joyce Potter is a 70 y.o. female who presents to ER for chest pain that began yesterday but has been steadily increasing. Today about 10 AM she had chest pain that radiated across her chest and went into her back. She had a history of an myocardial infarction in the past which had a similar feeling for which a stent was placed. She does have a history of anemia, she receives iron infusions at the cancer center. She has been seeing blood in her stools for the past several weeks that she thought was from Plavix. She denies any recent illness, has had increasing shortness of breath recently.   Past Medical History  Diagnosis Date  . IDDM (insulin dependent diabetes mellitus) ([place])   . High cholesterol   . HTN (hypertension)   . Multiple thyroid nodules     Benign  . CAD (coronary artery disease)   . Cataract   . Peptic ulcer disease   . Aortic stenosis   . Hyperlipidemia   . Basal cell carcinoma   . Anemia   . Retinal artery occlusion     Patient Active Problem List   Diagnosis Date Noted  . Central retinal artery occlusion 10/01/2015  . Carotid stenosis 10/01/2015  . Stroke ([place]) 09/28/2015  . Anemia 06/24/2015  . GI bleed from an occult source with recurrent chronic blood loss anemia 04/14/2013  . Blood loss anemia 04/14/2013  . CAD (coronary artery disease) with demand ischemia from anemia 04/14/2013  . IDDM (insulin dependent diabetes mellitus) ([place]) 04/14/2013  . HTN (hypertension) 04/14/2013  . Chest pain 04/14/2013    Past Surgical History  Procedure Laterality Date  . Coronary stent placement    . Partial hysterectomy    . Givens capsule study N/A  04/17/2013    Procedure: GIVENS CAPSULE STUDY;  Surgeon: Arta Silence, MD;  Location: [facility] ENDOSCOPY;  Service: Endoscopy;  Laterality: N/A;  patient ate breakfast at 7am   . Breast biopsy    . Coronary angioplasty    . Eye surgery    . Trigger finger release    . Cardiac catheterization N/A 08/08/2015    Procedure: Right and Left Heart Cath and Coronary Angiography;  Surgeon: Teodoro Spray, MD;  Location: [place] CV LAB;  Service: Cardiovascular;  Laterality: N/A;  . Endarterectomy Left 10/01/2015    Procedure: ENDARTERECTOMY CAROTID;  Surgeon: Algernon Huxley, MD;  Location: [facility] ORS;  Service: Vascular;  Laterality: Left;  . Cataract extraction w/ intraocular lens  implant, bilateral    . Artery biopsy Left 11/19/2015    Procedure: BIOPSY TEMPORAL ARTERY;  Surgeon: Algernon Huxley, MD;  Location: [facility] ORS;  Service: Vascular;  Laterality: Left;    Allergies Aspirin; Invokamet; Sulfa antibiotics; Lovastatin; Penicillins; and Vicodin  Social History Social History  Substance Use Topics  . Smoking status: Former Research scientist (life sciences)  . Smokeless tobacco: None  . Alcohol Use: No    Review of Systems Constitutional: Negative for fever. Eyes: Negative for visual changes. ENT: Negative for sore throat. Cardiovascular: Negative for chest pain. Respiratory: Positive for shortness of breath Gastrointestinal: Negative for abdominal pain, vomiting and diarrhea. Positive for rectal bleeding Genitourinary: Negative for dysuria. Musculoskeletal: Negative for back pain.  Skin: Negative for rash. Neurological: Negative for headaches, Positive for weakness  10-point ROS otherwise negative.  ____________________________________________   PHYSICAL EXAM:  VITAL SIGNS: ED Triage Vitals  Enc Vitals Group     BP 05/29/16 1048 137/58 mmHg     Pulse Rate 05/29/16 1048 80     Resp 05/29/16 1048 18     Temp 05/29/16 1048 98.2 F (36.8 C)     Temp Source 05/29/16 1048 Oral     SpO2 05/29/16 1048 98 %      Weight 05/29/16 1048 207 lb (93.895 kg)     Height 05/29/16 1048 5\' 3"  (1.6 m)     Head Cir --      Peak Flow --      Pain Score 05/29/16 1050 8     Pain Loc --      Pain Edu? --      Excl. in [place]? --     Constitutional: Alert and oriented. Well appearing and in no distress. Eyes: Conjunctivae are Pale. PERRL. Normal extraocular movements. ENT   Head: Normocephalic and atraumatic.   Nose: No congestion/rhinnorhea.   Mouth/Throat: Mucous membranes are moist.   Neck: No stridor. Cardiovascular: Normal rate, regular rhythm. Harsh systolic murmur noted at the upper left sternal border Respiratory: Normal respiratory effort without tachypnea nor retractions. Breath sounds are clear and equal bilaterally. No wheezes/rales/rhonchi. Gastrointestinal: Soft and nontender. Normal bowel sounds Musculoskeletal: Nontender with normal range of motion in all extremities. No lower extremity tenderness nor edema. Neurologic:  Normal speech and language. No gross focal neurologic deficits are appreciated.  Skin:  Skin is warm, dry and intact. No rash noted. Psychiatric: Mood and affect are normal. Speech and behavior are normal.  ____________________________________________  EKG: Interpreted by me. Sinus rhythm with a rate of 78 bpm, normal PR interval, normal QRS, normal QT interval. Normal axis.  ____________________________________________  ED COURSE:  Pertinent labs & imaging results that were available during my care of the patient were reviewed by me and considered in my medical decision making (see chart for details). Patient is in no acute distress, will check basic labs and reevaluate. Currently she is chest pain-free. ____________________________________________    LABS (pertinent positives/negatives)  Labs Reviewed  CBC - Abnormal; Notable for the following:    RBC 2.71 (*)    Hemoglobin 7.6 (*)    HCT 23.1 (*)    RDW 14.8 (*)    All other components within normal  limits  COMPREHENSIVE METABOLIC PANEL - Abnormal; Notable for the following:    CO2 20 (*)    Glucose, Bld 262 (*)    BUN 39 (*)    Creatinine, Ser 1.21 (*)    GFR calc non Af Amer 45 (*)    GFR calc Af Amer 52 (*)    All other components within normal limits  TROPONIN I  BRAIN NATRIURETIC PEPTIDE  LIPASE, BLOOD    RADIOLOGY  IMPRESSION: Cardiomegaly with vascular congestion  Lingula atelectasis versus scarring.   ____________________________________________  FINAL ASSESSMENT AND PLAN  Chest pain, anemia, rectal bleeding, hyperglycemia  Plan: Patient with labs and imaging as dictated above. Patient presents with chest pain which likely represents unstable angina. This is possibly anemia related. I will order 1 unit of blood for her. She will also receive nitroglycerin. Due to her rectal bleeding that will not give her anticoagulants at this time. I will discuss with the hospitalist for admission.   Earleen Newport, MD   Note: This  dictation was prepared with Dragon dictation. Any transcriptional errors that result from this process are unintentional   Earleen Newport, MD 05/29/16 1414

## 2016-05-29 NOTE — ED Notes (Signed)
States also SOB with history of cardiac valve disorder.

## 2016-05-29 NOTE — Progress Notes (Signed)
PHARMACIST - PHYSICIAN ORDER COMMUNICATION  CONCERNING: P&T Medication Policy on Herbal Medications  DESCRIPTION:  This patient's order for:  biotin  has been noted.  This product(s) is classified as an "herbal" or natural product. Due to a lack of definitive safety studies or FDA approval, nonstandard manufacturing practices, plus the potential risk of unknown drug-drug interactions while on inpatient medications, the Pharmacy and Therapeutics Committee does not permit the use of "herbal" or natural products of this type within [facility].   ACTION TAKEN: The pharmacy department is unable to verify this order at this time and your patient has been informed of this safety policy. Please reevaluate patient's clinical condition at discharge and address if the herbal or natural product(s) should be resumed at that time.  Joyce Potter Leesha Veno 5:18 PM

## 2016-05-29 NOTE — H&P (Signed)
[place] [facility] [place] NAME: Joyce Potter    MR#:  [id number]  DATE OF BIRTH:  [birth year]  DATE OF ADMISSION:  05/29/2016  PRIMARY CARE PHYSICIAN: Maryland Pink, MD   REQUESTING/REFERRING PHYSICIAN: Dr. Lenise Arena  CHIEF COMPLAINT:   Chief Complaint  Patient presents with  . Chest Pain    HISTORY OF PRESENT ILLNESS:  Joyce Potter  is a 70 y.o. female with a known history of Hypertension, insulin-dependent diabetes mellitus, coronary artery disease status post stent, carotid stenosis, aortic stenosis, history of CVA, known history of iron deficiency anemia on IV iron infusions at the cancer center presents to hospital secondary to worsening chest pain and also dark stools. Patient has known history of iron deficiency anemia, hemoglobin was around 7.5 in January 2017. She follows at the cancer center for IV iron infusions. She is not on any oral iron. 3 days ago her routine labs at PCPs office indicate hemoglobin of 9. She has been having dark stools for the last couple of months. For the last 2-3 days she also noticed blood mixed with stools. She was also straining during bowel movements. She had a colonoscopy in 2013 that showed internal hemorrhoids. She also has history of peptic ulcer disease. Last EGD was in 2014 and also had capsule endoscopy at the time. Is not on any aspirin. She has been on Plavix after her stent in the past, that was discontinued due to her anemia. However her Plavix has been restarted in August 2016 after her carotid endarterectomy. Patient also started having chest pains on exertion over the last 2-3 days. The pain would improve with rest. It would start in the midsternal region and radiate to both her shoulders. This morning she woke up feeling sick, was nauseated and extremely diaphoretic and had chest pain at rest and presented to the emergency room. Hemoglobin today is noted to be at 7.6. First troponin is  negative. Patient is being admitted for chest pain and also symptomatic acute on chronic anemia.   PAST MEDICAL HISTORY:   Past Medical History  Diagnosis Date  . IDDM (insulin dependent diabetes mellitus) ([place])   . High cholesterol   . HTN (hypertension)   . Multiple thyroid nodules     Benign  . CAD (coronary artery disease)     s/p Left circumflex stent in 2012  . Cataract   . Peptic ulcer disease   . Aortic stenosis   . Hyperlipidemia   . Basal cell carcinoma   . Anemia     iron deficiency  . Retinal artery occlusion     on left  . Carotid stenosis     PAST SURGICAL HISTORY:   Past Surgical History  Procedure Laterality Date  . Coronary stent placement    . Partial hysterectomy    . Givens capsule study N/A 04/17/2013    Procedure: GIVENS CAPSULE STUDY;  Surgeon: Arta Silence, MD;  Location: [facility] ENDOSCOPY;  Service: Endoscopy;  Laterality: N/A;  patient ate breakfast at 7am   . Breast biopsy    . Coronary angioplasty    . Eye surgery    . Trigger finger release    . Cardiac catheterization N/A 08/08/2015    Procedure: Right and Left Heart Cath and Coronary Angiography;  Surgeon: Teodoro Spray, MD;  Location: [place] CV LAB;  Service: Cardiovascular;  Laterality: N/A;  . Endarterectomy Left 10/01/2015    Procedure: ENDARTERECTOMY CAROTID;  Surgeon: Erskine Squibb  Lucky Cowboy, MD;  Location: [facility] ORS;  Service: Vascular;  Laterality: Left;  . Cataract extraction w/ intraocular lens  implant, bilateral    . Artery biopsy Left 11/19/2015    Procedure: BIOPSY TEMPORAL ARTERY;  Surgeon: Algernon Huxley, MD;  Location: [facility] ORS;  Service: Vascular;  Laterality: Left;  . Esophagogastroduodenoscopy    . Colonoscopy      in 2013- internal hemorrhoids    SOCIAL HISTORY:   Social History  Substance Use Topics  . Smoking status: Former Research scientist (life sciences)  . Smokeless tobacco: Not on file     Comment: quit about 31 years ago  . Alcohol Use: No    FAMILY HISTORY:   Family History  Problem  Relation Age of Onset  . Uterine cancer Mother   . Breast cancer Mother   . Colon cancer Neg Hx   . Seizures Father   . Stroke Father   . Diabetes Father   . COPD Father     DRUG ALLERGIES:   Allergies  Allergen Reactions  . Aspirin Other (See Comments)    ulcers  . Invokamet [Canagliflozin-Metformin Hcl] Other (See Comments)    Yeast infection  . Sulfa Antibiotics Other (See Comments)    "Got Drunk"  . Lovastatin Rash  . Penicillins Rash  . Vicodin [Hydrocodone-Acetaminophen] Rash    REVIEW OF SYSTEMS:   Review of Systems  Constitutional: Positive for malaise/fatigue and diaphoresis. Negative for fever, chills and weight loss.  HENT: Negative for ear discharge, ear pain, hearing loss and nosebleeds.   Eyes: Positive for blurred vision. Negative for double vision and photophobia.  Respiratory: Negative for cough, hemoptysis, shortness of breath and wheezing.   Cardiovascular: Positive for chest pain. Negative for palpitations, orthopnea and leg swelling.  Gastrointestinal: Positive for nausea. Negative for heartburn, vomiting, abdominal pain, diarrhea, constipation and melena.  Genitourinary: Negative for dysuria, urgency and frequency.  Musculoskeletal: Negative for myalgias, back pain and neck pain.  Skin: Negative for rash.  Neurological: Negative for dizziness, tingling, tremors, sensory change, speech change, focal weakness and headaches.  Endo/Heme/Allergies: Does not bruise/bleed easily.  Psychiatric/Behavioral: Negative for depression.    MEDICATIONS AT HOME:   Prior to Admission medications   Medication Sig Start Date End Date Taking? Authorizing Provider  ascorbic acid (VITAMIN C) 500 MG tablet Take 500 mg by mouth daily.    Historical Provider, MD  Biotin (RA BIOTIN) 1000 MCG tablet Take 1,000 mcg by mouth daily.    Historical Provider, MD  clopidogrel (PLAVIX) 75 MG tablet Take 1 tablet (75 mg total) by mouth daily with breakfast. 10/02/15   Algernon Huxley, MD   Flaxseed, Linseed, (FLAXSEED OIL) 1000 MG CAPS Take 1,000 mg by mouth daily.    Historical Provider, MD  GARLIC PO Take 1 capsule by mouth daily.    Historical Provider, MD  insulin NPH-regular Human (NOVOLIN 70/30) (70-30) 100 UNIT/ML injection Inject into the skin 2 (two) times daily with a meal. 48U in am and 28U in pm    Historical Provider, MD  metoprolol (LOPRESSOR) 100 MG tablet Take 100 mg by mouth 2 (two) times daily.    Historical Provider, MD  Multiple Vitamins-Minerals (MULTIVITAMIN WITH MINERALS) tablet Take 1 tablet by mouth daily.    Historical Provider, MD  omeprazole (PRILOSEC) 40 MG capsule Take 40 mg by mouth daily.    Historical Provider, MD  pravastatin (PRAVACHOL) 10 MG tablet Take 1 tablet (10 mg total) by mouth daily at 6 PM. 09/29/15   Sital  Mody, MD  traMADol (ULTRAM) 50 MG tablet Take 1 tablet (50 mg total) by mouth every 6 (six) hours as needed for moderate pain. 10/02/15   Algernon Huxley, MD      VITAL SIGNS:  Blood pressure 120/41, pulse 62, temperature 98.2 F (36.8 C), temperature source Oral, resp. rate 20, height 5\' 3"  (1.6 m), weight 93.895 kg (207 lb), SpO2 99 %.  PHYSICAL EXAMINATION:   Physical Exam  GENERAL:  70 y.o.-year-old patient lying in the bed with no acute distress.  EYES: Pupils equal, round, reactive to light and accommodation. No scleral icterus. Extraocular muscles intact.  HEENT: Head atraumatic, normocephalic. Oropharynx and nasopharynx clear.  NECK:  Supple, no jugular venous distention. No thyroid enlargement, no tenderness.  LUNGS: Normal breath sounds bilaterally, no wheezing, rales,rhonchi or crepitation. No use of accessory muscles of respiration.  CARDIOVASCULAR: S1, S2 normal. No rubs, or gallops. 3/6 systolic murmur in the aortic area ABDOMEN: Soft, nontender, nondistended. Bowel sounds present. No organomegaly or mass.  EXTREMITIES: No pedal edema, cyanosis, or clubbing.  NEUROLOGIC: Cranial nerves II through XII are intact except  loss of central vision in the left eye. Muscle strength 5/5 in all extremities. Sensation intact. Gait not checked.  PSYCHIATRIC: The patient is alert and oriented x 3.  SKIN: No obvious rash, lesion, or ulcer.   LABORATORY PANEL:   CBC  Recent Labs Lab 05/29/16 1050  WBC 5.6  HGB 7.6*  HCT 23.1*  PLT 300   ------------------------------------------------------------------------------------------------------------------  Chemistries   Recent Labs Lab 05/29/16 1050  NA 137  K 4.3  CL 105  CO2 20*  GLUCOSE 262*  BUN 39*  CREATININE 1.21*  CALCIUM 8.9  AST 38  ALT 23  ALKPHOS 71  BILITOT 0.5   ------------------------------------------------------------------------------------------------------------------  Cardiac Enzymes  Recent Labs Lab 05/29/16 1050  TROPONINI <0.03   ------------------------------------------------------------------------------------------------------------------  RADIOLOGY:  Dg Chest 2 View  05/29/2016  CLINICAL DATA:  Chest pain shortness of breath EXAM: CHEST  2 VIEW COMPARISON:  09/28/2015 FINDINGS: Cardiomegaly evident with chronic vascular and interstitial prominence. No definite superimposed edema or CHF. No effusion or pneumothorax. Lingula bandlike density, suspect atelectasis versus scarring. Trachea is midline. Degenerative changes of the spine. No severe compression fracture or definite acute osseous finding. IMPRESSION: Cardiomegaly with vascular congestion Lingula atelectasis versus scarring. Electronically Signed   By: Jerilynn Mages.  Shick M.D.   On: 05/29/2016 11:46    EKG:   Orders placed or performed during the hospital encounter of 05/29/16  . EKG 12-Lead  . EKG 12-Lead  . ED EKG  . ED EKG    IMPRESSION AND PLAN:   Tilden Inzer  is a 70 y.o. female with a known history of Hypertension, insulin-dependent diabetes mellitus, coronary artery disease status post stent, carotid stenosis, aortic stenosis, history of CVA, known  history of iron deficiency anemia on IV iron infusions at the cancer center presents to hospital secondary to worsening chest pain and also dark stools.  #1 Chest pain- could be angina from symptomatic anemia Monitor on tele, recycle troponins Cards consult, hold plavix Last stress test normal in July 2016  #2 Acute on chronic iron deficiency anemia- baseline hb maintained above 9, iron infusions at the cancer center Previous GI work up done, h/o PUD GI consult, 1unit Tx, anemia studies PPI bid Hemoglobin check q8h  #3 DM- hba1c, continue home 70/30 insulin and also SSI  #4 HTN- hold as BP is on lower side  #5 CAD- as mentioned above, monitor  on tele, troponins, cards consult Hold plavix  #6 DVT Prophylaxis- TEDs and SCDs No heparin products due to anemia    All the records are reviewed and case discussed with ED provider. Management plans discussed with the patient, family and they are in agreement.  CODE STATUS: Full Code  TOTAL TIME TAKING CARE OF THIS PATIENT: 50 minutes.    Gladstone Lighter M.D on 05/29/2016 at 2:42 PM  Between 7am to 6pm - Pager - [phone]  After 6pm go to [email or web address] - password [email or web address] [place]  [facility]  Office  [phone]  CC: Primary care physician; Maryland Pink, MD

## 2016-05-30 LAB — CBC
HCT: 23.1 % — ABNORMAL LOW (ref 35.0–47.0)
Hemoglobin: 7.5 g/dL — ABNORMAL LOW (ref 12.0–16.0)
MCH: 27.6 pg (ref 26.0–34.0)
MCHC: 32.7 g/dL (ref 32.0–36.0)
MCV: 84.4 fL (ref 80.0–100.0)
PLATELETS: 233 10*3/uL (ref 150–440)
RBC: 2.74 MIL/uL — AB (ref 3.80–5.20)
RDW: 14.7 % — AB (ref 11.5–14.5)
WBC: 4.2 10*3/uL (ref 3.6–11.0)

## 2016-05-30 LAB — BASIC METABOLIC PANEL
Anion gap: 7 (ref 5–15)
BUN: 32 mg/dL — AB (ref 6–20)
CALCIUM: 8.2 mg/dL — AB (ref 8.9–10.3)
CHLORIDE: 108 mmol/L (ref 101–111)
CO2: 24 mmol/L (ref 22–32)
CREATININE: 1.04 mg/dL — AB (ref 0.44–1.00)
GFR calc non Af Amer: 54 mL/min — ABNORMAL LOW (ref >60)
Glucose, Bld: 183 mg/dL — ABNORMAL HIGH (ref 65–99)
Potassium: 4 mmol/L (ref 3.5–5.1)
SODIUM: 139 mmol/L (ref 135–145)

## 2016-05-30 LAB — PREPARE RBC (CROSSMATCH)

## 2016-05-30 LAB — HEMOGLOBIN A1C: HEMOGLOBIN A1C: 8.1 % — AB (ref 4.0–6.0)

## 2016-05-30 LAB — GLUCOSE, CAPILLARY
GLUCOSE-CAPILLARY: 121 mg/dL — AB (ref 65–99)
Glucose-Capillary: 161 mg/dL — ABNORMAL HIGH (ref 65–99)
Glucose-Capillary: 158 mg/dL — ABNORMAL HIGH (ref 65–99)
Glucose-Capillary: 165 mg/dL — ABNORMAL HIGH (ref 65–99)

## 2016-05-30 LAB — HEMOGLOBIN AND HEMATOCRIT, BLOOD
HEMATOCRIT: 28.7 % — AB (ref 35.0–47.0)
HEMOGLOBIN: 9.5 g/dL — AB (ref 12.0–16.0)

## 2016-05-30 LAB — TROPONIN I: Troponin I: <0.03 ng/mL (ref <0.031)

## 2016-05-30 LAB — HEMOGLOBIN: HEMOGLOBIN: 8.2 g/dL — AB (ref 12.0–16.0)

## 2016-05-30 MED ORDER — METOPROLOL TARTRATE 50 MG PO TABS
100.0000 mg | ORAL_TABLET | Freq: Two times a day (BID) | ORAL | Status: DC
  Administered 2016-05-30 – 2016-06-01 (×3): 100 mg via ORAL
  Filled 2016-05-30: qty 2
  Filled 2016-05-30 (×4): qty 1

## 2016-05-30 MED ORDER — SODIUM CHLORIDE 0.9 % IV SOLN
Freq: Once | INTRAVENOUS | Status: AC
  Administered 2016-05-30: 13:00:00 via INTRAVENOUS

## 2016-05-30 NOTE — Consult Note (Signed)
[facility] Consultation Note  Patient ID: Joyce Potter, MRN: [id number], DOB/AGE: 1946/04/09 70 y.o. Admit date: 05/29/2016   Date of Consult: 05/30/2016 Primary Physician: Maryland Pink, MD Primary Cardiologist:Fath  Chief Complaint:  Chief Complaint  Patient presents with  . Chest Pain   Reason for Consult: chest pain  HPI: 70 y.o. female with known peripheral vascular disease status post history of cerebrovascular accident and carotid endarterectomy in 2016 with known coronary artery disease status post previous stenting and moderate aortic valve stenosis without previous myocardial infarction diabetes with complication essential hypertension with new onset of significant GI bleed most consistent with recent use of Plavix with carotid endarterectomy causing a black stools. The patient has a significant hemoglobin decreased at 7.5 and has received blood as feeling much better. Recently she has been weak and fatigued and tired with chest discomfort off-and-on but has a normal troponin and EKG showing normal sinus rhythm with no evidence of myocardial infarction at this time. The patient does feel better after appropriate treatment  Past Medical History  Diagnosis Date  . IDDM (insulin dependent diabetes mellitus) ([place])   . High cholesterol   . HTN (hypertension)   . Multiple thyroid nodules     Benign  . CAD (coronary artery disease)     s/p Left circumflex stent in 2012  . Cataract   . Peptic ulcer disease   . Aortic stenosis   . Hyperlipidemia   . Basal cell carcinoma   . Anemia     iron deficiency  . Retinal artery occlusion     on left  . Carotid stenosis       Surgical History:  Past Surgical History  Procedure Laterality Date  . Coronary stent placement    . Partial hysterectomy    . Givens capsule study N/A 04/17/2013    Procedure: GIVENS CAPSULE STUDY;  Surgeon: Arta Silence, MD;  Location: [facility] ENDOSCOPY;  Service: Endoscopy;  Laterality: N/A;   patient ate breakfast at 7am   . Breast biopsy    . Coronary angioplasty    . Eye surgery    . Trigger finger release    . Cardiac catheterization N/A 08/08/2015    Procedure: Right and Left Heart Cath and Coronary Angiography;  Surgeon: Teodoro Spray, MD;  Location: [facility];  Service: Cardiovascular;  Laterality: N/A;  . Endarterectomy Left 10/01/2015    Procedure: ENDARTERECTOMY CAROTID;  Surgeon: Algernon Huxley, MD;  Location: [facility] ORS;  Service: Vascular;  Laterality: Left;  . Cataract extraction w/ intraocular lens  implant, bilateral    . Artery biopsy Left 11/19/2015    Procedure: BIOPSY TEMPORAL ARTERY;  Surgeon: Algernon Huxley, MD;  Location: [facility] ORS;  Service: Vascular;  Laterality: Left;  . Esophagogastroduodenoscopy    . Colonoscopy      in 2013- internal hemorrhoids     Home Meds: Prior to Admission medications   Medication Sig Start Date End Date Taking? Authorizing Provider  albuterol (PROAIR HFA) 108 (90 Base) MCG/ACT inhaler Inhale 2 puffs into the lungs every 4 (four) hours as needed. For shortness of breath/wheezing. 02/16/16  Yes Historical Provider, MD  ascorbic acid (VITAMIN C) 500 MG tablet Take 500 mg by mouth daily.   Yes Historical Provider, MD  Biotin (RA BIOTIN) 1000 MCG tablet Take 1,000 mcg by mouth daily.   Yes Historical Provider, MD  ciprofloxacin (CIPRO) 250 MG tablet Take 250 mg by mouth 2 (two) times daily. X 7 days.  05/27/16 06/03/16 Yes Historical Provider, MD  clopidogrel (PLAVIX) 75 MG tablet Take 1 tablet (75 mg total) by mouth daily with breakfast. 10/02/15  Yes Algernon Huxley, MD  Flaxseed, Linseed, (FLAXSEED OIL) 1000 MG CAPS Take 1,000 mg by mouth daily.   Yes Historical Provider, MD  GARLIC PO Take 1 capsule by mouth daily.   Yes Historical Provider, MD  insulin NPH-regular Human (NOVOLIN 70/30) (70-30) 100 UNIT/ML injection Inject 50-56 Units into the skin See admin instructions. Inject 56 units subcutaneous every morning and Inject 50 units  subcutaneous every day in the evening.   Yes Historical Provider, MD  insulin regular (HUMULIN R) 100 units/mL injection Inject into the skin See admin instructions. Inject 0-10 units subcutaneous 3 times daily before meals if blood sugar is over 150. 04/21/16  Yes Historical Provider, MD  losartan (COZAAR) 100 MG tablet Take 100 mg by mouth daily.   Yes Historical Provider, MD  metoprolol (LOPRESSOR) 100 MG tablet Take 100 mg by mouth 2 (two) times daily.   Yes Historical Provider, MD  Multiple Vitamins-Minerals (MULTIVITAMIN WITH MINERALS) tablet Take 1 tablet by mouth daily.   Yes Historical Provider, MD  omeprazole (PRILOSEC) 40 MG capsule Take 40 mg by mouth daily.   Yes Historical Provider, MD  pravastatin (PRAVACHOL) 10 MG tablet Take 1 tablet (10 mg total) by mouth daily at 6 PM. 09/29/15  Yes Sital Mody, MD  traMADol (ULTRAM) 50 MG tablet Take 1 tablet (50 mg total) by mouth every 6 (six) hours as needed for moderate pain. 10/02/15   Algernon Huxley, MD    Inpatient Medications:  . ciprofloxacin  250 mg Oral BID  . docusate sodium  100 mg Oral BID  . insulin aspart  0-5 Units Subcutaneous QHS  . insulin aspart  0-9 Units Subcutaneous TID WC  . insulin aspart protamine- aspart  55 Units Subcutaneous BID WC  . metoprolol  100 mg Oral BID  . multivitamin with minerals  1 tablet Oral Daily  . nitroGLYCERIN  1 inch Topical Once  . pantoprazole  40 mg Oral BID AC  . pravastatin  10 mg Oral q1800  . sodium chloride flush  3 mL Intravenous Q12H  . ascorbic acid  500 mg Oral Daily   . sodium chloride 75 mL/hr at 05/29/16 2038    Allergies:  Allergies  Allergen Reactions  . Aspirin Other (See Comments)    ulcers  . Invokamet [Canagliflozin-Metformin Hcl] Other (See Comments)    Yeast infection  . Sulfa Antibiotics Other (See Comments)    "Got Drunk"  . Lovastatin Rash  . Penicillins Rash  . Vicodin [Hydrocodone-Acetaminophen] Rash    Social History   Social History  . Marital  Status: Widowed    Spouse Name: N/A  . Number of Children: N/A  . Years of Education: N/A   Occupational History  . Not on file.   Social History Main Topics  . Smoking status: Former Research scientist (life sciences)  . Smokeless tobacco: Not on file     Comment: quit about 31 years ago  . Alcohol Use: No  . Drug Use: No  . Sexual Activity: Not on file   Other Topics Concern  . Not on file   Social History Narrative   Lives at home independently.     Family History  Problem Relation Age of Onset  . Uterine cancer Mother   . Breast cancer Mother   . Colon cancer Neg Hx   . Seizures Father   .  Stroke Father   . Diabetes Father   . COPD Father      Review of Systems Positive forBleeding shortness of breath chest pain Negative for: General:  chills, fever, night sweats or weight changes.  Cardiovascular: PND orthopnea syncope dizziness  Dermatological skin lesions rashes Respiratory: Cough congestion Urologic: Frequent urination urination at night and hematuria Abdominal: negative for nausea, vomiting, diarrhea, bright red blood per rectum,  or hematemesis Neurologic: negative for visual changes, and/or hearing changes  All other systems reviewed and are otherwise negative except as noted above.  Labs:  Recent Labs  05/29/16 1050 05/29/16 1504 05/30/16 0056 05/30/16 0700  TROPONINI <0.03 <0.03 <0.03 <0.03   Lab Results  Component Value Date   WBC 4.2 05/30/2016   HGB 8.2* 05/30/2016   HCT 23.1* 05/30/2016   MCV 84.4 05/30/2016   PLT 233 05/30/2016    Recent Labs Lab 05/29/16 1050 05/30/16 0056  NA 137 139  K 4.3 4.0  CL 105 108  CO2 20* 24  BUN 39* 32*  CREATININE 1.21* 1.04*  CALCIUM 8.9 8.2*  PROT 6.9  --   BILITOT 0.5  --   ALKPHOS 71  --   ALT 23  --   AST 38  --   GLUCOSE 262* 183*   Lab Results  Component Value Date   CHOL 188 09/29/2015   HDL 41 09/29/2015   LDLCALC 131* 09/29/2015   TRIG 81 09/29/2015   No results found for:  DDIMER  Radiology/Studies:  Dg Chest 2 View  05/29/2016  CLINICAL DATA:  Chest pain shortness of breath EXAM: CHEST  2 VIEW COMPARISON:  09/28/2015 FINDINGS: Cardiomegaly evident with chronic vascular and interstitial prominence. No definite superimposed edema or CHF. No effusion or pneumothorax. Lingula bandlike density, suspect atelectasis versus scarring. Trachea is midline. Degenerative changes of the spine. No severe compression fracture or definite acute osseous finding. IMPRESSION: Cardiomegaly with vascular congestion Lingula atelectasis versus scarring. Electronically Signed   By: Jerilynn Mages.  Shick M.D.   On: 05/29/2016 11:46    [id number] sinus rhythm with poor R-wave progression  Weights: Filed Weights   05/29/16 1048 05/29/16 1640  Weight: 207 lb (93.895 kg) 207 lb 14.4 oz (94.303 kg)     Physical Exam: Blood pressure 119/46, pulse 72, temperature 98.8 F (37.1 C), temperature source Oral, resp. rate 18, height 5\' 3"  (1.6 m), weight 207 lb 14.4 oz (94.303 kg), SpO2 97 %. Body mass index is 36.84 kg/(m^2). General: Well developed, well nourished, in no acute distress. Head eyes ears nose throat: Normocephalic, atraumatic, sclera non-icteric, no xanthomas, nares are without discharge. No apparent thyromegaly and/or mass  Lungs: Normal respiratory effort.  no wheezes, no rales, no rhonchi.  Heart: RRR with normal S1 Soft S2. 4+ right upper sternal border murmur gallop, no rub, PMI is normal size and placement, carotid upstroke normal with  bruit, jugular venous pressure is normal Abdomen: Soft, non-tender, non-distended with normoactive bowel sounds. No hepatomegaly. No rebound/guarding. No obvious abdominal masses. Abdominal aorta is normal size without bruit Extremities: No edema. no cyanosis, no clubbing, no ulcers  Peripheral : 2+ bilateral upper extremity pulses, 2+ bilateral femoral pulses, 2+ bilateral dorsal pedal pulse Neuro: Alert and oriented. No facial asymmetry. No focal  deficit. Moves all extremities spontaneously. Musculoskeletal: Normal muscle tone without kyphosis Psych:  Responds to questions appropriately with a normal affect.    Assessment: 70 year old female with moderate aortic valve stenosis with coronary artery disease status post stenting essential hypertension mixed hyperlipidemia  diabetes with complication carotid endarterectomy status post previous cerebrovascular accident with new GI bleed more consistent with probable arteriovenous malformations but no evidence of myocardial infarction with chest pain most consistent with situation rather than coronary artery disease issues  Plan: 1. Discontinuation of aspirin and Plavix due to upper GI bleed 2. Continue high intensity cholesterol therapy for coronary artery disease and carotid atherosclerosis 3. No further cardiac workup necessary at this time 4. Proceed to upper endoscopy or other evaluation as necessary with currently at low risk for cardiovascular complication with procedure 5. All if further questions  Signed, Corey Skains M.D. [facility] 05/30/2016, 2:56 PM

## 2016-05-30 NOTE — Consult Note (Signed)
Patient seen with PA [name], will transfuse another unit of blood now and check labs later to see how hgb level is.  Plan to probably do an EGD tomorrow and see what is source of bleeding.

## 2016-05-30 NOTE — Consult Note (Signed)
GI Inpatient Consult Note  Reason for Consult: Anemia    Attending Requesting Consult: Dr. Tressia Miners  History of Present Illness: Joyce Potter is a 70 y.o. female seen for evaluation of anemia at the request of Dr. Tressia Miners. PMMhx of HTN, CAD (s/p stent 2012), PUD, aortic stenosis, IDA (on iron transfusions) admitted for chest pain.   She reports developing dark brown stools since restarting her Plavix in 10/2015, denies any melena. She has occasional BRB as well with straining, associated w/ rectal burning, improved w/ stool softener. Usually having 2 BM/day. Denies any lower abdominal pain, diarrhea. Denies nausea/vomiting, GERD, dysphagia. She does have upper abdominal bloating at times, usually resolves w/ GasX. She is on omeprazole 40mg  qd at home. She came into the hospital due to worsening SOB/CP cause inability to ambulate around her home. She denies NSAID use. No aspirin containing products.    Between 2012-2014 had multiple GI bleeds of unknown etiology requiring transfusions, 2 EGDs, 2 colonoscopies, 2-3 video capsules were unremarkable, improved when able to d/c Plavix. Started plavix back 10/2015 after carotid surgery.    Past Medical History:  Past Medical History  Diagnosis Date  . IDDM (insulin dependent diabetes mellitus) ([place])   . High cholesterol   . HTN (hypertension)   . Multiple thyroid nodules     Benign  . CAD (coronary artery disease)     s/p Left circumflex stent in 2012  . Cataract   . Peptic ulcer disease   . Aortic stenosis   . Hyperlipidemia   . Basal cell carcinoma   . Anemia     iron deficiency  . Retinal artery occlusion     on left  . Carotid stenosis     Problem List: Patient Active Problem List   Diagnosis Date Noted  . Central retinal artery occlusion 10/01/2015  . Carotid stenosis 10/01/2015  . Stroke ([place]) 09/28/2015  . Anemia 06/24/2015  . GI bleed from an occult source with recurrent chronic blood loss anemia 04/14/2013  .  Blood loss anemia 04/14/2013  . CAD (coronary artery disease) with demand ischemia from anemia 04/14/2013  . IDDM (insulin dependent diabetes mellitus) ([place]) 04/14/2013  . HTN (hypertension) 04/14/2013  . Chest pain 04/14/2013    Past Surgical History: Past Surgical History  Procedure Laterality Date  . Coronary stent placement    . Partial hysterectomy    . Givens capsule study N/A 04/17/2013    Procedure: GIVENS CAPSULE STUDY;  Surgeon: Arta Silence, MD;  [facility] ENDOSCOPY;  Service: Endoscopy;  Laterality: N/A;  patient ate breakfast at 7am   . Breast biopsy    . Coronary angioplasty    . Eye surgery    . Trigger finger release    . Cardiac catheterization N/A 08/08/2015    Procedure: Right and Left Heart Cath and Coronary Angiography;  Surgeon: Teodoro Spray, MD;  Location: [place] CV LAB;  Service: Cardiovascular;  Laterality: N/A;  . Endarterectomy Left 10/01/2015    Procedure: ENDARTERECTOMY CAROTID;  Surgeon: Algernon Huxley, MD;  Location: [facility] ORS;  Service: Vascular;  Laterality: Left;  . Cataract extraction w/ intraocular lens  implant, bilateral    . Artery biopsy Left 11/19/2015    Procedure: BIOPSY TEMPORAL ARTERY;  Surgeon: Algernon Huxley, MD;  Location: [facility] ORS;  Service: Vascular;  Laterality: Left;  . Esophagogastroduodenoscopy    . Colonoscopy      in 2013- internal hemorrhoids    Allergies: Allergies  Allergen Reactions  .  Aspirin Other (See Comments)    ulcers  . Invokamet [Canagliflozin-Metformin Hcl] Other (See Comments)    Yeast infection  . Sulfa Antibiotics Other (See Comments)    "Got Drunk"  . Lovastatin Rash  . Penicillins Rash  . Vicodin [Hydrocodone-Acetaminophen] Rash    Home Medications: Prescriptions prior to admission  Medication Sig Dispense Refill Last Dose  . albuterol (PROAIR HFA) 108 (90 Base) MCG/ACT inhaler Inhale 2 puffs into the lungs every 4 (four) hours as needed. For shortness of breath/wheezing.   Past Month at Unknown  time  . ascorbic acid (VITAMIN C) 500 MG tablet Take 500 mg by mouth daily.   05/29/2016 at Unknown time  . Biotin (RA BIOTIN) 1000 MCG tablet Take 1,000 mcg by mouth daily.   05/29/2016 at Unknown time  . ciprofloxacin (CIPRO) 250 MG tablet Take 250 mg by mouth 2 (two) times daily. X 7 days.   haven't started yet  . clopidogrel (PLAVIX) 75 MG tablet Take 1 tablet (75 mg total) by mouth daily with breakfast. 30 tablet 5 05/29/2016 at Unknown time  . Flaxseed, Linseed, (FLAXSEED OIL) 1000 MG CAPS Take 1,000 mg by mouth daily.   05/29/2016 at Unknown time  . GARLIC PO Take 1 capsule by mouth daily.   05/29/2016 at Unknown time  . insulin NPH-regular Human (NOVOLIN 70/30) (70-30) 100 UNIT/ML injection Inject 50-56 Units into the skin See admin instructions. Inject 56 units subcutaneous every morning and Inject 50 units subcutaneous every day in the evening.   05/28/2016 at Unknown time  . insulin regular (HUMULIN R) 100 units/mL injection Inject into the skin See admin instructions. Inject 0-10 units subcutaneous 3 times daily before meals if blood sugar is over 150.   05/27/2016  . losartan (COZAAR) 100 MG tablet Take 100 mg by mouth daily.   05/29/2016 at Unknown time  . metoprolol (LOPRESSOR) 100 MG tablet Take 100 mg by mouth 2 (two) times daily.   05/29/2016 at 0830  . Multiple Vitamins-Minerals (MULTIVITAMIN WITH MINERALS) tablet Take 1 tablet by mouth daily.   05/29/2016 at Unknown time  . omeprazole (PRILOSEC) 40 MG capsule Take 40 mg by mouth daily.   05/29/2016 at Unknown time  . pravastatin (PRAVACHOL) 10 MG tablet Take 1 tablet (10 mg total) by mouth daily at 6 PM. 30 tablet 0 05/28/2016 at Unknown time  . traMADol (ULTRAM) 50 MG tablet Take 1 tablet (50 mg total) by mouth every 6 (six) hours as needed for moderate pain. 30 tablet 0 Taking   Home medication reconciliation was completed with the patient.   Scheduled Inpatient Medications:   . sodium chloride   Intravenous Once  . ciprofloxacin  250 mg Oral BID   . docusate sodium  100 mg Oral BID  . insulin aspart  0-5 Units Subcutaneous QHS  . insulin aspart  0-9 Units Subcutaneous TID WC  . insulin aspart protamine- aspart  55 Units Subcutaneous BID WC  . multivitamin with minerals  1 tablet Oral Daily  . nitroGLYCERIN  1 inch Topical Once  . pantoprazole  40 mg Oral BID AC  . pravastatin  10 mg Oral q1800  . sodium chloride flush  3 mL Intravenous Q12H  . ascorbic acid  500 mg Oral Daily    Continuous Inpatient Infusions:   . sodium chloride 75 mL/hr at 05/29/16 2038    PRN Inpatient Medications:  acetaminophen **OR** acetaminophen, ondansetron **OR** ondansetron (ZOFRAN) IV, traMADol  Family History: family history includes Breast cancer in  her mother; COPD in her father; Diabetes in her father; Seizures in her father; Stroke in her father; Uterine cancer in her mother. There is no history of Colon cancer.  The patient's family history is negative for inflammatory bowel disorders, GI malignancy, or solid organ transplantation.  Social History:   reports that she has quit smoking. She does not have any smokeless tobacco history on file. She reports that she does not drink alcohol or use illicit drugs. The patient denies ETOH, tobacco, or drug use.   Review of Systems: Constitutional: Weight is stable.  Eyes: No changes in vision. ENT: No oral lesions, sore throat.  GI: see HPI.  Heme/Lymph: No easy bruising.  CV: No chest pain.  GU: No hematuria.  Integumentary: No rashes.  Neuro: No headaches.  Psych: No depression/anxiety.  Endocrine: No heat/cold intolerance.  Allergic/Immunologic: No urticaria.  Resp: No cough, SOB.  Musculoskeletal: No joint swelling.    Physical Examination: BP 124/43 mmHg  Pulse 73  Temp(Src) 98.4 F (36.9 C) (Oral)  Resp 18  Ht 5\' 3"  (1.6 m)  Wt 207 lb 14.4 oz (94.303 kg)  BMI 36.84 kg/m2  SpO2 96% Gen: NAD, alert and oriented x 4 HEENT: PEERLA, EOMI, Neck: supple, no JVD or  thyromegaly Chest: CTA bilaterally, no wheezes, crackles, or other adventitious sounds CV: 3/6 systolic murmur USB, RRR Abd: soft, NT, ND, +BS in all four quadrants; no HSM, guarding, ridigity, or rebound tenderness RECTAL-brown stool in rectal vault, heme +  Ext: no edema, well perfused with 2+ pulses, Skin: no rash or lesions noted Lymph: no LAD  Data: Lab Results  Component Value Date   WBC 4.2 05/30/2016   HGB 8.2* 05/30/2016   HCT 23.1* 05/30/2016   MCV 84.4 05/30/2016   PLT 233 05/30/2016    Recent Labs Lab 05/29/16 1504 05/30/16 0056 05/30/16 0700  HGB 7.6* 7.5* 8.2*   Lab Results  Component Value Date   NA 139 05/30/2016   K 4.0 05/30/2016   CL 108 05/30/2016   CO2 24 05/30/2016   BUN 32* 05/30/2016   CREATININE 1.04* 05/30/2016   Lab Results  Component Value Date   ALT 23 05/29/2016   AST 38 05/29/2016   ALKPHOS 71 05/29/2016   BILITOT 0.5 05/29/2016   No results for input(s): APTT, INR, PTT in the last 168 hours.   Assessment/Plan: Ms. Davene Costain is a 70 y.o. female   1. Anemia - acute on chronic, Hgb March 2017 was 10.2 at time of last iron infusion. Recurrent GI bleed in 2013-2014 without evidence of source on multiple EGD, colonoscopy, VCE. Admitted for worsening SOB/CP. Received 1 transfusion and improved 7.6--8.2, will repeat an additional unit and check CBC 1 hour after. Consider upper GI source given her elevated BUN/Cr ratio. If Hgb not improved will plan for EGD tomorrow, NPO at [place]. Last plavix dose 05/29/16.  2. Positive occult blood   Recommendations: 1. Additional unit transfused 2. NPO at midnight 3. Potential EGD in AM. 4. Check CBC following transfusion.  5. Continue holding Plavix.   Case discussed w/ Dr. Vira Agar.   Thank you for the consult. Please call with questions or concerns.  Ronney Asters, PA-C [place]

## 2016-05-30 NOTE — Progress Notes (Signed)
Pt. Stated she was wheezing, pt. Was assessed, no audible wheezing observed, lungs were clear with diminished bases, O2 sats were 95% on room air, breathing was even and unlabored. Upon entering room pt. Was asleep and had/has no SOB or acute distress noted. Pt. Given HS meds and she went back to sleep. Will continue to monitor pt.

## 2016-05-30 NOTE — Progress Notes (Signed)
Joyce Potter at [place] NAME: Joyce Potter    MRN#:  [id number]  DATE OF BIRTH:  Apr 06, 1946  SUBJECTIVE:  Hospital Day: 1 day Joyce Potter is a 69 y.o. female presenting with Chest Pain .   Overnight events: Status post 1 unit transfusion Interval Events: Complains of weakness this morning  REVIEW OF SYSTEMS:  CONSTITUTIONAL: No fever, Positive fatigue or weakness.  EYES: No blurred or double vision.  EARS, NOSE, AND THROAT: No tinnitus or ear pain.  RESPIRATORY: No cough, shortness of breath, wheezing or hemoptysis.  CARDIOVASCULAR: No chest pain, orthopnea, edema.  GASTROINTESTINAL: No nausea, vomiting, diarrhea or abdominal pain. Positive melena GENITOURINARY: No dysuria, hematuria.  ENDOCRINE: No polyuria, nocturia,  HEMATOLOGY: No anemia, easy bruising or bleeding SKIN: No rash or lesion. MUSCULOSKELETAL: No joint pain or arthritis.   NEUROLOGIC: No tingling, numbness, weakness.  PSYCHIATRY: No anxiety or depression.   DRUG ALLERGIES:   Allergies  Allergen Reactions  . Aspirin Other (See Comments)    ulcers  . Invokamet [Canagliflozin-Metformin Hcl] Other (See Comments)    Yeast infection  . Sulfa Antibiotics Other (See Comments)    "Got Drunk"  . Lovastatin Rash  . Penicillins Rash  . Vicodin [Hydrocodone-Acetaminophen] Rash    VITALS:  Blood pressure 124/43, pulse 73, temperature 98.4 F (36.9 C), temperature source Oral, resp. rate 18, height 5\' 3"  (1.6 m), weight 207 lb 14.4 oz (94.303 kg), SpO2 96 %.  PHYSICAL EXAMINATION:  VITAL SIGNS: Filed Vitals:   05/30/16 0800 05/30/16 1124  BP: 121/92 124/43  Pulse: 69 73  Temp: 97.7 F (36.5 C) 98.4 F (36.9 C)  Resp: 90 18   GENERAL:69 y.o.female currently in no acute distress.  HEAD: Normocephalic, atraumatic.  EYES: Pupils equal, round, reactive to light. Extraocular muscles intact. No scleral icterus.  MOUTH: Moist mucosal membrane. Dentition  intact. No abscess noted.  EAR, NOSE, THROAT: Clear without exudates. No external lesions.  NECK: Supple. No thyromegaly. No nodules. No JVD.  PULMONARY: Clear to ascultation, without wheeze rails or rhonci. No use of accessory muscles, Good respiratory effort. good air entry bilaterally CHEST: Nontender to palpation.  CARDIOVASCULAR: S1 and S2. Regular rate and rhythm. 3/6 systolic ejection murmur, rubs, or gallops. No edema. Pedal pulses 2+ bilaterally.  GASTROINTESTINAL: Soft, nontender, nondistended. No masses. Positive bowel sounds. No hepatosplenomegaly.  MUSCULOSKELETAL: No swelling, clubbing, or edema. Range of motion full in all extremities.  NEUROLOGIC: Cranial nerves II through XII are intact. No gross focal neurological deficits. Sensation intact. Reflexes intact.  SKIN: No ulceration, lesions, rashes, or cyanosis. Skin warm and dry. Turgor intact.  PSYCHIATRIC: Mood, affect within normal limits. The patient is awake, alert and oriented x 3. Insight, judgment intact.      LABORATORY PANEL:   CBC  Recent Labs Lab 05/30/16 0056 05/30/16 0700  WBC 4.2  --   HGB 7.5* 8.2*  HCT 23.1*  --   PLT 233  --    ------------------------------------------------------------------------------------------------------------------  Chemistries   Recent Labs Lab 05/29/16 1050 05/30/16 0056  NA 137 139  K 4.3 4.0  CL 105 108  CO2 20* 24  GLUCOSE 262* 183*  BUN 39* 32*  CREATININE 1.21* 1.04*  CALCIUM 8.9 8.2*  AST 38  --   ALT 23  --   ALKPHOS 71  --   BILITOT 0.5  --    ------------------------------------------------------------------------------------------------------------------  Cardiac Enzymes  Recent Labs Lab 05/30/16 0700  TROPONINI <0.03   ------------------------------------------------------------------------------------------------------------------  RADIOLOGY:  Dg Chest 2 View  05/29/2016  CLINICAL DATA:  Chest pain shortness of breath EXAM: CHEST  2  VIEW COMPARISON:  09/28/2015 FINDINGS: Cardiomegaly evident with chronic vascular and interstitial prominence. No definite superimposed edema or CHF. No effusion or pneumothorax. Lingula bandlike density, suspect atelectasis versus scarring. Trachea is midline. Degenerative changes of the spine. No severe compression fracture or definite acute osseous finding. IMPRESSION: Cardiomegaly with vascular congestion Lingula atelectasis versus scarring. Electronically Signed   By: Jerilynn Mages.  Shick M.D.   On: 05/29/2016 11:46    EKG:   Orders placed or performed during the hospital encounter of 05/29/16  . EKG 12-Lead  . EKG 12-Lead  . ED EKG  . ED EKG    ASSESSMENT AND PLAN:   Joyce Potter is a 70 y.o. female presenting with Chest Pain . Admitted 05/29/2016 : Day #: 1 day 1. Symptomatic anemia secondary to upper GI bleed: Continue Protonix, appreciate GI recommendations, given the minimal effective transfusion will require one further units blood transfusion 2. Type 2 diabetes insulin requiring: Continue basal insulin 3. Essential hypertension:Restart metoprolol  All the records are reviewed and case discussed with Care Management/Social Workerr. Management plans discussed with the patient, family and they are in agreement.  CODE STATUS: full TOTAL TIME TAKING CARE OF THIS PATIENT: 33 minutes.   POSSIBLE D/C IN 1-2DAYS, DEPENDING ON CLINICAL CONDITION.   Hower,  Karenann Potter on 05/30/2016 at 12:29 PM  Between 7am to 6pm - Pager - [phone]  After 6pm: House Pager: - [phone]  [facility]  Office  [phone]  CC: Primary care physician; Maryland Pink, MD

## 2016-05-31 ENCOUNTER — Encounter
Admission: EM | Disposition: A | Discharge status: Home / Self Care | Payer: Self-pay | Source: Home / Self Care | Attending: Internal Medicine

## 2016-05-31 ENCOUNTER — Encounter: Payer: Self-pay | Admitting: *Deleted

## 2016-05-31 ENCOUNTER — Inpatient Hospital Stay: Payer: Medicare HMO | Admitting: Certified Registered Nurse Anesthetist

## 2016-05-31 HISTORY — PX: ESOPHAGOGASTRODUODENOSCOPY (EGD) WITH PROPOFOL: SHX5813

## 2016-05-31 LAB — CBC
HEMATOCRIT: 29.4 % — AB (ref 35.0–47.0)
HEMOGLOBIN: 9.8 g/dL — AB (ref 12.0–16.0)
MCH: 28.1 pg (ref 26.0–34.0)
MCHC: 33.2 g/dL (ref 32.0–36.0)
MCV: 84.5 fL (ref 80.0–100.0)
Platelets: 260 10*3/uL (ref 150–440)
RBC: 3.47 MIL/uL — ABNORMAL LOW (ref 3.80–5.20)
RDW: 14.5 % (ref 11.5–14.5)
WBC: 5.3 10*3/uL (ref 3.6–11.0)

## 2016-05-31 LAB — TYPE AND SCREEN
ABO/RH(D): O POS
ANTIBODY SCREEN: NEGATIVE
Unit division: 0
Unit division: 0

## 2016-05-31 LAB — GLUCOSE, CAPILLARY
GLUCOSE-CAPILLARY: 241 mg/dL — AB (ref 65–99)
GLUCOSE-CAPILLARY: 121 mg/dL — AB (ref 65–99)
Glucose-Capillary: 102 mg/dL — ABNORMAL HIGH (ref 65–99)
Glucose-Capillary: 97 mg/dL (ref 65–99)

## 2016-05-31 SURGERY — ESOPHAGOGASTRODUODENOSCOPY (EGD) WITH PROPOFOL
Anesthesia: General

## 2016-05-31 SURGERY — EGD (ESOPHAGOGASTRODUODENOSCOPY)
Anesthesia: Monitor Anesthesia Care

## 2016-05-31 MED ORDER — LIDOCAINE HCL (CARDIAC) 20 MG/ML IV SOLN
INTRAVENOUS | Status: DC | PRN
  Administered 2016-05-31: 30 mg via INTRAVENOUS

## 2016-05-31 MED ORDER — SODIUM CHLORIDE 0.9 % IV SOLN
INTRAVENOUS | Status: DC
  Administered 2016-05-31: 13:00:00 via INTRAVENOUS

## 2016-05-31 MED ORDER — PROPOFOL 500 MG/50ML IV EMUL
INTRAVENOUS | Status: DC | PRN
  Administered 2016-05-31: 120 ug/kg/min via INTRAVENOUS

## 2016-05-31 MED ORDER — BUTAMBEN-TETRACAINE-BENZOCAINE 2-2-14 % EX AERO
INHALATION_SPRAY | CUTANEOUS | Status: DC | PRN
  Administered 2016-05-31: 1 via TOPICAL

## 2016-05-31 MED ORDER — PROPOFOL 10 MG/ML IV BOLUS
INTRAVENOUS | Status: DC | PRN
  Administered 2016-05-31 (×2): 20 mg via INTRAVENOUS

## 2016-05-31 MED ORDER — MIDAZOLAM HCL 2 MG/2ML IJ SOLN
INTRAMUSCULAR | Status: DC | PRN
  Administered 2016-05-31: 1 mg via INTRAVENOUS

## 2016-05-31 NOTE — Anesthesia Procedure Notes (Signed)
Date/Time: 05/31/2016 1:00 PM Performed by: Johnna Acosta Pre-anesthesia Checklist: Patient identified, Emergency Drugs available, Suction available, Patient being monitored and Timeout performed Patient Re-evaluated:Patient Re-evaluated prior to inductionOxygen Delivery Method: Nasal cannula

## 2016-05-31 NOTE — Consult Note (Signed)
Given her normal EGD and her hgb coming up appropriately I would go ahead and give her 81mg  ASA but hold the plavix if possible.  I do not plan colonoscopy given her significant vascular disease. Hgb 9.8 today, would not transfuse any further at this time if it stays near this level.

## 2016-05-31 NOTE — Progress Notes (Signed)
[facility] [place] NAME: Joyce Potter    MRN#:  [id number]  DATE OF BIRTH:  09-May-1946  SUBJECTIVE:  Hospital Day: 2 days Joyce Potter is a 70 y.o. female presenting with Chest Pain .   Overnight events: No overnight events Interval Events: No complaints at this time  REVIEW OF SYSTEMS:  CONSTITUTIONAL: No fever, Positive fatigue or weakness.  EYES: No blurred or double vision.  EARS, NOSE, AND THROAT: No tinnitus or ear pain.  RESPIRATORY: No cough, shortness of breath, wheezing or hemoptysis.  CARDIOVASCULAR: No chest pain, orthopnea, edema.  GASTROINTESTINAL: No nausea, vomiting, diarrhea or abdominal pain. Positive melena GENITOURINARY: No dysuria, hematuria.  ENDOCRINE: No polyuria, nocturia,  HEMATOLOGY: No anemia, easy bruising or bleeding SKIN: No rash or lesion. MUSCULOSKELETAL: No joint pain or arthritis.   NEUROLOGIC: No tingling, numbness, weakness.  PSYCHIATRY: No anxiety or depression.   DRUG ALLERGIES:   Allergies  Allergen Reactions  . Aspirin Other (See Comments)    ulcers  . Invokamet [Canagliflozin-Metformin Hcl] Other (See Comments)    Yeast infection  . Sulfa Antibiotics Other (See Comments)    "Got Drunk"  . Lovastatin Rash  . Penicillins Rash  . Vicodin [Hydrocodone-Acetaminophen] Rash    VITALS:  Blood pressure 132/55, pulse 62, temperature 98.3 F (36.8 C), temperature source Tympanic, resp. rate 19, height 5\' 3"  (1.6 m), weight 207 lb 14.4 oz (94.303 kg), SpO2 94 %.  PHYSICAL EXAMINATION:  VITAL SIGNS: Filed Vitals:   05/31/16 1332 05/31/16 1352  BP: 123/47 132/55  Pulse: 62 62  Temp:    Resp: 21 28   GENERAL:69 y.o.female currently in no acute distress.  HEAD: Normocephalic, atraumatic.  EYES: Pupils equal, round, reactive to light. Extraocular muscles intact. No scleral icterus.  MOUTH: Moist mucosal membrane. Dentition intact. No abscess noted.  EAR, NOSE, THROAT: Clear  without exudates. No external lesions.  NECK: Supple. No thyromegaly. No nodules. No JVD.  PULMONARY: Clear to ascultation, without wheeze rails or rhonci. No use of accessory muscles, Good respiratory effort. good air entry bilaterally CHEST: Nontender to palpation.  CARDIOVASCULAR: S1 and S2. Regular rate and rhythm. 3/6 systolic ejection murmur, rubs, or gallops. No edema. Pedal pulses 2+ bilaterally.  GASTROINTESTINAL: Soft, nontender, nondistended. No masses. Positive bowel sounds. No hepatosplenomegaly.  MUSCULOSKELETAL: No swelling, clubbing, or edema. Range of motion full in all extremities.  NEUROLOGIC: Cranial nerves II through XII are intact. No gross focal neurological deficits. Sensation intact. Reflexes intact.  SKIN: No ulceration, lesions, rashes, or cyanosis. Skin warm and dry. Turgor intact.  PSYCHIATRIC: Mood, affect within normal limits. The patient is awake, alert and oriented x 3. Insight, judgment intact.      LABORATORY PANEL:   CBC  Recent Labs Lab 05/31/16 0950  WBC 5.3  HGB 9.8*  HCT 29.4*  PLT 260   ------------------------------------------------------------------------------------------------------------------  Chemistries   Recent Labs Lab 05/29/16 1050 05/30/16 0056  NA 137 139  K 4.3 4.0  CL 105 108  CO2 20* 24  GLUCOSE 262* 183*  BUN 39* 32*  CREATININE 1.21* 1.04*  CALCIUM 8.9 8.2*  AST 38  --   ALT 23  --   ALKPHOS 71  --   BILITOT 0.5  --    ------------------------------------------------------------------------------------------------------------------  Cardiac Enzymes  Recent Labs Lab 05/30/16 0700  TROPONINI <0.03   ------------------------------------------------------------------------------------------------------------------  RADIOLOGY:  No results found.  EKG:   Orders placed or performed during the hospital encounter of 05/29/16  .  EKG 12-Lead  . EKG 12-Lead  . ED EKG  . ED EKG    ASSESSMENT AND PLAN:    Joyce Potter is a 70 y.o. female presenting with Chest Pain . Admitted 05/29/2016 : Day #: 2 days 1. Symptomatic anemia secondary to upper GI bleed: Continue Protonix, appreciate GI recommendations,blood count improved after 2 units total transfusion endoscopy today  2. Type 2 diabetes insulin requiring: Continue basal insulin 3. Essential hypertension:Restart metoprolol  All the records are reviewed and case discussed with Care Management/Social Workerr. Management plans discussed with the patient, family and they are in agreement.  CODE STATUS: full TOTAL TIME TAKING CARE OF THIS PATIENT: 33 minutes.   POSSIBLE Potter/C IN 1-2DAYS, DEPENDING ON CLINICAL CONDITION.   Joyce Potter,  Joyce Potter on 05/31/2016 at 3:10 PM  Between 7am to 6pm - Pager - [phone]  After 6pm: House Pager: - [phone]  [facility]  Office  [phone]  CC: Primary care physician; [place] Pink, MD

## 2016-05-31 NOTE — Anesthesia Postprocedure Evaluation (Signed)
Anesthesia Post Note  Patient: Joyce Potter  Procedure(s) Performed: Procedure(s) (LRB): ESOPHAGOGASTRODUODENOSCOPY (EGD) WITH PROPOFOL (N/A)  Patient location during evaluation: PACU Anesthesia Type: General Level of consciousness: awake and alert Pain management: pain level controlled Vital Signs Assessment: post-procedure vital signs reviewed and stable Respiratory status: spontaneous breathing, nonlabored ventilation, respiratory function stable and patient connected to nasal cannula oxygen Cardiovascular status: blood pressure returned to baseline and stable Postop Assessment: no signs of nausea or vomiting Anesthetic complications: no    Last Vitals:  Filed Vitals:   05/31/16 1312 05/31/16 1322  BP: 120/52 116/62  Pulse: 62 63  Temp: 36.8 C   Resp: 24 22    Last Pain:  Filed Vitals:   05/31/16 1328  PainSc: 0-No pain                 Molli Barrows

## 2016-05-31 NOTE — Op Note (Signed)
[facility] Gastroenterology Patient Name: Joyce Potter Procedure Date: 05/31/2016 12:29 PM MRN: [id number] Account #: [id number] Date of Birth: 1946-06-07 Admit Type: Inpatient Age: 70 Room: [facility] ENDO ROOM 1 Gender: Female Note Status: Finalized Procedure:            Upper GI endoscopy Indications:          Iron deficiency anemia secondary to chronic blood loss Providers:            Manya Silvas, MD Referring MD:         Irven Easterly. Kary Kos, MD (Referring MD) Medicines:            Propofol per Anesthesia Complications:        No immediate complications. Procedure:            Pre-Anesthesia Assessment:                       - After reviewing the risks and benefits, the patient                        was deemed in satisfactory condition to undergo the                        procedure.                       After obtaining informed consent, the endoscope was                        passed under direct vision. Throughout the procedure,                        the patient's blood pressure, pulse, and oxygen                        saturations were monitored continuously. The Endoscope                        was introduced through the mouth, and advanced to the                        second part of duodenum. The upper GI endoscopy was                        accomplished without difficulty. The patient tolerated                        the procedure well. Findings:      The examined esophagus was normal. GEJ 40cm.      The stomach was normal.      The examined duodenum was normal. Impression:           - Normal esophagus.                       - Normal stomach.                       - Normal examined duodenum.                       - No specimens collected. Recommendation:       - The findings and  recommendations were discussed with                        the patient. Manya Silvas, MD 05/31/2016 1:08:33 PM This report has been signed electronically. Number of  Addenda: 0 Note Initiated On: 05/31/2016 12:29 PM      [facility]

## 2016-05-31 NOTE — Anesthesia Preprocedure Evaluation (Signed)
Anesthesia Evaluation  Patient identified by MRN, date of birth, ID band Patient awake    Reviewed: Allergy & Precautions, H&P , NPO status , Patient's Chart, lab work & pertinent test results, reviewed documented beta blocker date and time   Airway Mallampati: II   Neck ROM: full    Dental  (+) Poor Dentition   Pulmonary neg pulmonary ROS, neg shortness of breath, former smoker,    Pulmonary exam normal        Cardiovascular hypertension, On Medications + CAD and + Peripheral Vascular Disease  negative cardio ROS Normal cardiovascular exam     Neuro/Psych CVA, Residual Symptoms negative neurological ROS  negative psych ROS   GI/Hepatic negative GI ROS, Neg liver ROS, PUD,   Endo/Other  negative endocrine ROSdiabetes  Renal/GU negative Renal ROS  negative genitourinary   Musculoskeletal   Abdominal   Peds  Hematology negative hematology ROS (+) anemia ,   Anesthesia Other Findings Past Medical History:   IDDM (insulin dependent diabetes mellitus) (HC*              High cholesterol                                             HTN (hypertension)                                           Multiple thyroid nodules                                       Comment:Benign   CAD (coronary artery disease)                                  Comment:s/p Left circumflex stent in 2012   Cataract                                                     Peptic ulcer disease                                         Aortic stenosis                                              Hyperlipidemia                                               Basal cell carcinoma  Anemia                                                         Comment:iron deficiency   Retinal artery occlusion                                       Comment:on left   Carotid stenosis                                           Past Surgical  History:   CORONARY STENT PLACEMENT                                      PARTIAL HYSTERECTOMY                                          GIVENS CAPSULE STUDY                            N/A 04/17/2013      Comment:Procedure: GIVENS CAPSULE STUDY;  Surgeon:               Arta Silence, MD;  Location: [facility] ENDOSCOPY;                Service: Endoscopy;  Laterality: N/A;  patient               ate breakfast at [facility]                         N/A 08/08/2015      Comment:Procedure: Right and Left Heart Cath and               Coronary Angiography;  Surgeon: Teodoro Spray,  MD;  Location: [facility] CV LAB;  Service:               Cardiovascular;  Laterality: N/A;   ENDARTERECTOMY                                  Left 10/01/2015      Comment:Procedure: ENDARTERECTOMY CAROTID;  Surgeon:               Algernon Huxley, MD;  Location: [facility] ORS;  Service:              Vascular;  Laterality: Left;   CATARACT EXTRACTION W/ INTRAOCULAR LENS  IMPLA*               ARTERY BIOPSY                                   Left 11/19/2015     Comment:Procedure: BIOPSY TEMPORAL ARTERY;  Surgeon:               Algernon Huxley, MD;  Location: [facility] ORS;  Service:              Vascular;  Laterality: Left;   ESOPHAGOGASTRODUODENOSCOPY                                    COLONOSCOPY                                                     Comment:in 2013- internal hemorrhoids BMI    Body Mass Index   36.83 kg/m 2     Reproductive/Obstetrics                             Anesthesia Physical Anesthesia Plan  ASA: IV and emergent  Anesthesia Plan: General   Post-op Pain Management:    Induction:   Airway Management Planned:    Additional Equipment:   Intra-op Plan:   Post-operative Plan:   Informed Consent: I have reviewed the patients History and Physical, chart, labs and discussed the procedure including the risks, benefits and alternatives for the proposed anesthesia with the patient or authorized representative who has indicated his/her understanding and acceptance.   Dental Advisory Given  Plan Discussed with: CRNA  Anesthesia Plan Comments:         Anesthesia Quick Evaluation

## 2016-05-31 NOTE — Consult Note (Signed)
Patient EGD completely normal.  Tolerated well.

## 2016-05-31 NOTE — Transfer of Care (Signed)
Immediate Anesthesia Transfer of Care Note  Patient: Rachelle Hora  Procedure(s) Performed: Procedure(s): ESOPHAGOGASTRODUODENOSCOPY (EGD) WITH PROPOFOL (N/A)  Patient Location: PACU  Anesthesia Type:General  Level of Consciousness: sedated  Airway & Oxygen Therapy: Patient Spontanous Breathing and Patient connected to nasal cannula oxygen  Post-op Assessment: Report given to RN and Post -op Vital signs reviewed and stable  Post vital signs: Reviewed and stable  Last Vitals:  Filed Vitals:   05/31/16 0800 05/31/16 1104  BP: 124/50 129/74  Pulse: 59 60  Temp: 36.7 C 36.7 C  Resp: 16 18    Last Pain:  Filed Vitals:   05/31/16 1104  PainSc: 0-No pain         Complications: No apparent anesthesia complications

## 2016-05-31 NOTE — Care Management Important Message (Signed)
Important Message  Patient Details  Name: RENIYAH BURGO MRN: [id number] Date of Birth: [birth year]   Medicare Important Message Given:  Yes    Katrina Stack, RN 05/31/2016, 2:30 PM

## 2016-06-01 ENCOUNTER — Encounter: Payer: Self-pay | Admitting: Unknown Physician Specialty

## 2016-06-01 LAB — URINALYSIS COMPLETE WITH MICROSCOPIC (ARMC ONLY)
BACTERIA UA: NONE SEEN
Bilirubin Urine: NEGATIVE
Glucose, UA: NEGATIVE mg/dL
HGB URINE DIPSTICK: NEGATIVE
Ketones, ur: NEGATIVE mg/dL
Leukocytes, UA: NEGATIVE
Nitrite: NEGATIVE
PH: 7 (ref 5.0–8.0)
PROTEIN: NEGATIVE mg/dL
RBC / HPF: NONE SEEN RBC/hpf (ref 0–5)
Specific Gravity, Urine: 1.006 (ref 1.005–1.030)

## 2016-06-01 LAB — CBC
HEMATOCRIT: 28.5 % — AB (ref 35.0–47.0)
Hemoglobin: 9.5 g/dL — ABNORMAL LOW (ref 12.0–16.0)
MCH: 27.9 pg (ref 26.0–34.0)
MCHC: 33.2 g/dL (ref 32.0–36.0)
MCV: 83.9 fL (ref 80.0–100.0)
PLATELETS: 257 10*3/uL (ref 150–440)
RBC: 3.39 MIL/uL — AB (ref 3.80–5.20)
RDW: 14.5 % (ref 11.5–14.5)
WBC: 6.3 10*3/uL (ref 3.6–11.0)

## 2016-06-01 LAB — POCT CBG MONITORING
Glucose Fasting, POC: 164 mg/dL — AB (ref 70–99)
POCT GLUCOSE (MANUAL ENTRY) KUC: 96 mg/dL (ref 70–99)

## 2016-06-01 MED ORDER — CIPROFLOXACIN HCL 250 MG PO TABS: 250.0000 mg | ORAL_TABLET | Freq: Two times a day (BID) | ORAL | Status: DC

## 2016-06-01 MED ORDER — INSULIN ASPART PROT & ASPART (70-30 MIX) 100 UNIT/ML ~~LOC~~ SUSP
20.0000 [IU] | Freq: Once | SUBCUTANEOUS | Status: AC
  Administered 2016-06-01: 20 [IU] via SUBCUTANEOUS
  Filled 2016-06-01: qty 20

## 2016-06-01 NOTE — Progress Notes (Signed)
Pt transferred from 2A to the lower level. Alert and oriented, no pain at this time.

## 2016-06-01 NOTE — Discharge Summary (Signed)
[facility] [place] NAME: Joyce Potter    MR#:  [id number]  DATE OF BIRTH:  07/30/1946  DATE OF ADMISSION:  05/29/2016 ADMITTING PHYSICIAN: Gladstone Lighter, MD  DATE OF DISCHARGE: 06/01/2016  PRIMARY CARE PHYSICIAN: Maryland Pink, MD    ADMISSION DIAGNOSIS:  Rectal bleeding [K62.5] Chest pain, unspecified chest pain type [R07.9] Anemia, unspecified anemia type [D64.9]  DISCHARGE DIAGNOSIS:  Symptomatic anemia   SECONDARY DIAGNOSIS:   Past Medical History  Diagnosis Date  . IDDM (insulin dependent diabetes mellitus) ([place])   . High cholesterol   . HTN (hypertension)   . Multiple thyroid nodules     Benign  . CAD (coronary artery disease)     s/p Left circumflex stent in 2012  . Cataract   . Peptic ulcer disease   . Aortic stenosis   . Hyperlipidemia   . Basal cell carcinoma   . Anemia     iron deficiency  . Retinal artery occlusion     on left  . Carotid stenosis     HOSPITAL COURSE:  Joyce Potter  is a 70 y.o. female admitted 05/29/2016 with chief complaint Chest Pain . Please see H&P performed by Gladstone Lighter, MD for further information. She presented with the above symptoms in addition to melena. She was noted to be anemic on arrival and transfused a total of 2 units of blood with appropriate response. Evaluated by cardiology and gastroenterology during her stay. Underwent EGD - which was within normal limits.  DISCHARGE CONDITIONS:   Stable/improved  CONSULTS OBTAINED:  Treatment Team:  Corey Skains, MD Manya Silvas, MD  DRUG ALLERGIES:   Allergies  Allergen Reactions  . Aspirin Other (See Comments)    ulcers  . Invokamet [Canagliflozin-Metformin Hcl] Other (See Comments)    Yeast infection  . Sulfa Antibiotics Other (See Comments)    "Got Drunk"  . Lovastatin Rash  . Penicillins Rash  . Vicodin [Hydrocodone-Acetaminophen] Rash    DISCHARGE MEDICATIONS:   Current Discharge  Medication List    CONTINUE these medications which have CHANGED   Details  ciprofloxacin (CIPRO) 250 MG tablet Take 1 tablet (250 mg total) by mouth 2 (two) times daily. Qty: 4 tablet, Refills: 0      CONTINUE these medications which have NOT CHANGED   Details  albuterol (PROAIR HFA) 108 (90 Base) MCG/ACT inhaler Inhale 2 puffs into the lungs every 4 (four) hours as needed. For shortness of breath/wheezing.    ascorbic acid (VITAMIN C) 500 MG tablet Take 500 mg by mouth daily.    Biotin (RA BIOTIN) 1000 MCG tablet Take 1,000 mcg by mouth daily.    Flaxseed, Linseed, (FLAXSEED OIL) 1000 MG CAPS Take 1,000 mg by mouth daily.    GARLIC PO Take 1 capsule by mouth daily.    insulin NPH-regular Human (NOVOLIN 70/30) (70-30) 100 UNIT/ML injection Inject 50-56 Units into the skin See admin instructions. Inject 56 units subcutaneous every morning and Inject 50 units subcutaneous every day in the evening.    insulin regular (HUMULIN R) 100 units/mL injection Inject into the skin See admin instructions. Inject 0-10 units subcutaneous 3 times daily before meals if blood sugar is over 150.    losartan (COZAAR) 100 MG tablet Take 100 mg by mouth daily.    metoprolol (LOPRESSOR) 100 MG tablet Take 100 mg by mouth 2 (two) times daily.    Multiple Vitamins-Minerals (MULTIVITAMIN WITH MINERALS) tablet Take 1 tablet by mouth  daily.    omeprazole (PRILOSEC) 40 MG capsule Take 40 mg by mouth daily.    pravastatin (PRAVACHOL) 10 MG tablet Take 1 tablet (10 mg total) by mouth daily at 6 PM. Qty: 30 tablet, Refills: 0    traMADol (ULTRAM) 50 MG tablet Take 1 tablet (50 mg total) by mouth every 6 (six) hours as needed for moderate pain. Qty: 30 tablet, Refills: 0      STOP taking these medications     clopidogrel (PLAVIX) 75 MG tablet          DISCHARGE INSTRUCTIONS:  Hold plavex  DIET:  Diabetic diet  DISCHARGE CONDITION:  Stable  ACTIVITY:  Activity as tolerated  OXYGEN:  Home  Oxygen: No.   Oxygen Delivery: room air  DISCHARGE LOCATION:  home   If you experience worsening of your admission symptoms, develop shortness of breath, life threatening emergency, suicidal or homicidal thoughts you must seek medical attention immediately by calling 911 or calling your MD immediately  if symptoms less severe.  You Must read complete instructions/literature along with all the possible adverse reactions/side effects for all the Medicines you take and that have been prescribed to you. Take any new Medicines after you have completely understood and accpet all the possible adverse reactions/side effects.   Please note  You were cared for by a hospitalist during your hospital stay. If you have any questions about your discharge medications or the care you received while you were in the hospital after you are discharged, you can call the unit and asked to speak with the hospitalist on call if the hospitalist that took care of you is not available. Once you are discharged, your primary care physician will handle any further medical issues. Please note that NO REFILLS for any discharge medications will be authorized once you are discharged, as it is imperative that you return to your primary care physician (or establish a relationship with a primary care physician if you do not have one) for your aftercare needs so that they can reassess your need for medications and monitor your lab values.    On the day of Discharge:   VITAL SIGNS:  Blood pressure 127/42, pulse 72, temperature 98 F (36.7 C), temperature source Oral, resp. rate 20, height 5\' 3"  (1.6 m), weight 207 lb 14.4 oz (94.303 kg), SpO2 95 %.  I/O:   Intake/Output Summary (Last 24 hours) at 06/01/16 1137 Last data filed at 06/01/16 0536  Gross per 24 hour  Intake    200 ml  Output   1600 ml  Net  -1400 ml    PHYSICAL EXAMINATION:  GENERAL:  70 y.o.-year-old patient lying in the bed with no acute distress.  EYES:  Pupils equal, round, reactive to light and accommodation. No scleral icterus. Extraocular muscles intact.  HEENT: Head atraumatic, normocephalic. Oropharynx and nasopharynx clear.  NECK:  Supple, no jugular venous distention. No thyroid enlargement, no tenderness.  LUNGS: Normal breath sounds bilaterally, no wheezing, rales,rhonchi or crepitation. No use of accessory muscles of respiration.  CARDIOVASCULAR: S1, S2 normal. 3/65murmur, rubs, or gallops.  ABDOMEN: Soft, non-tender, non-distended. Bowel sounds present. No organomegaly or mass.  EXTREMITIES: No pedal edema, cyanosis, or clubbing.  NEUROLOGIC: Cranial nerves II through XII are intact. Muscle strength 5/5 in all extremities. Sensation intact. Gait not checked.  PSYCHIATRIC: The patient is alert and oriented x 3.  SKIN: No obvious rash, lesion, or ulcer.   DATA REVIEW:   CBC  Recent Labs Lab 06/01/16  0920  WBC 6.3  HGB 9.5*  HCT 28.5*  PLT 257    Chemistries   Recent Labs Lab 05/29/16 1050 05/30/16 0056  NA 137 139  K 4.3 4.0  CL 105 108  CO2 20* 24  GLUCOSE 262* 183*  BUN 39* 32*  CREATININE 1.21* 1.04*  CALCIUM 8.9 8.2*  AST 38  --   ALT 23  --   ALKPHOS 71  --   BILITOT 0.5  --     Cardiac Enzymes  Recent Labs Lab 05/30/16 0700  TROPONINI <0.03    Microbiology Results  Results for orders placed or performed during the hospital encounter of 09/28/15  MRSA PCR Screening     Status: None   Collection Time: 10/01/15  6:28 PM  Result Value Ref Range Status   MRSA by PCR NEGATIVE NEGATIVE Final    Comment:        The GeneXpert MRSA Assay (FDA approved for NASAL specimens only), is one component of a comprehensive MRSA colonization surveillance program. It is not intended to diagnose MRSA infection nor to guide or monitor treatment for MRSA infections.     RADIOLOGY:  No results found.   Management plans discussed with the patient, family and they are in agreement.  CODE STATUS:       Code Status Orders        Start     Ordered   05/29/16 1653  Full code   Continuous     05/29/16 1652    Code Status History    Date Active Date Inactive Code Status Order ID Comments User Context   10/01/2015  3:12 PM 10/02/2015  6:03 PM Full Code [id number]  Algernon Huxley, MD Inpatient   09/28/2015  3:53 PM 10/01/2015  3:12 PM Full Code [id number]  Loletha Grayer, MD ED   04/14/2013  2:02 AM 04/18/2013  4:55 PM Full Code [id number]  Etta Quill, DO ED      TOTAL TIME TAKING CARE OF THIS PATIENT: 28 minutes.    Hower,  [name] on 06/01/2016 at 11:37 AM  Between 7am to 6pm - Pager - [phone]  After 6pm go to [email or web address] - Proofreader  [facility]  Office  [phone]  CC: Primary care physician; Maryland Pink, MD

## 2016-06-01 NOTE — Progress Notes (Signed)
Discussed discharge instructions and medications with pt and her daughter.  Iv removed. No questions at this time.  Pt transported home via car by daughter.  Clarise Cruz, RN

## 2016-06-07 LAB — GLUCOSE, CAPILLARY
GLUCOSE-CAPILLARY: 96 mg/dL (ref 65–99)
GLUCOSE-CAPILLARY: 164 mg/dL — AB (ref 65–99)

## 2016-06-08 ENCOUNTER — Inpatient Hospital Stay: Payer: Medicare HMO

## 2016-06-08 ENCOUNTER — Inpatient Hospital Stay: Payer: Medicare HMO | Admitting: Oncology

## 2016-06-09 ENCOUNTER — Inpatient Hospital Stay: Payer: Medicare HMO | Attending: Oncology | Admitting: Oncology

## 2016-06-09 ENCOUNTER — Inpatient Hospital Stay: Payer: Medicare HMO

## 2016-06-09 VITALS — BP 117/69 | HR 70

## 2016-06-09 VITALS — BP 126/65 | HR 74 | Temp 96.3°F | Resp 18 | Wt 208.0 lb

## 2016-06-09 DIAGNOSIS — D509 Iron deficiency anemia, unspecified: Secondary | ICD-10-CM | POA: Diagnosis not present

## 2016-06-09 DIAGNOSIS — E7800 Pure hypercholesterolemia, unspecified: Secondary | ICD-10-CM | POA: Insufficient documentation

## 2016-06-09 DIAGNOSIS — E119 Type 2 diabetes mellitus without complications: Secondary | ICD-10-CM | POA: Insufficient documentation

## 2016-06-09 DIAGNOSIS — I10 Essential (primary) hypertension: Secondary | ICD-10-CM | POA: Insufficient documentation

## 2016-06-09 DIAGNOSIS — Z79899 Other long term (current) drug therapy: Secondary | ICD-10-CM | POA: Diagnosis not present

## 2016-06-09 DIAGNOSIS — I2510 Atherosclerotic heart disease of native coronary artery without angina pectoris: Secondary | ICD-10-CM | POA: Diagnosis not present

## 2016-06-09 DIAGNOSIS — Z955 Presence of coronary angioplasty implant and graft: Secondary | ICD-10-CM | POA: Diagnosis not present

## 2016-06-09 DIAGNOSIS — Z87891 Personal history of nicotine dependence: Secondary | ICD-10-CM | POA: Diagnosis not present

## 2016-06-09 DIAGNOSIS — Z85828 Personal history of other malignant neoplasm of skin: Secondary | ICD-10-CM | POA: Diagnosis not present

## 2016-06-09 DIAGNOSIS — D500 Iron deficiency anemia secondary to blood loss (chronic): Secondary | ICD-10-CM

## 2016-06-09 DIAGNOSIS — Z794 Long term (current) use of insulin: Secondary | ICD-10-CM | POA: Insufficient documentation

## 2016-06-09 MED ORDER — SODIUM CHLORIDE 0.9 % IV SOLN
510.0000 mg | Freq: Once | INTRAVENOUS | Status: AC
Start: 2016-06-09 — End: 2016-06-09
  Administered 2016-06-09: 510 mg via INTRAVENOUS
  Filled 2016-06-09: qty 17

## 2016-06-09 MED ORDER — SODIUM CHLORIDE 0.9 % IV SOLN
Freq: Once | INTRAVENOUS | Status: AC
  Administered 2016-06-09: 15:00:00 via INTRAVENOUS
  Filled 2016-06-09: qty 1000

## 2016-06-09 NOTE — Progress Notes (Signed)
Offers no complaints. States is feeling well. Had appt with Dr. Ubaldo Glassing yesterday who cleared her to get colonoscopy done with Dr. Vira Agar.

## 2016-06-14 ENCOUNTER — Ambulatory Visit: Payer: Commercial Managed Care - HMO | Admitting: Internal Medicine

## 2016-06-14 ENCOUNTER — Other Ambulatory Visit: Payer: Commercial Managed Care - HMO

## 2016-06-16 ENCOUNTER — Inpatient Hospital Stay: Payer: Medicare HMO

## 2016-06-16 ENCOUNTER — Other Ambulatory Visit: Payer: Self-pay | Admitting: Oncology

## 2016-06-16 VITALS — BP 115/61 | HR 66 | Temp 97.1°F | Resp 18

## 2016-06-16 DIAGNOSIS — D500 Iron deficiency anemia secondary to blood loss (chronic): Secondary | ICD-10-CM

## 2016-06-16 DIAGNOSIS — D509 Iron deficiency anemia, unspecified: Secondary | ICD-10-CM | POA: Diagnosis not present

## 2016-06-16 MED ORDER — SODIUM CHLORIDE 0.9 % IV SOLN
Freq: Once | INTRAVENOUS | Status: AC
  Administered 2016-06-16: 09:00:00 via INTRAVENOUS
  Filled 2016-06-16: qty 1000

## 2016-06-16 MED ORDER — SODIUM CHLORIDE 0.9 % IV SOLN
510.0000 mg | Freq: Once | INTRAVENOUS | Status: AC
  Administered 2016-06-16: 510 mg via INTRAVENOUS
  Filled 2016-06-16: qty 17

## 2016-06-19 NOTE — Progress Notes (Signed)
[facility]  [phone] [phone]) [phone]  ID: Joyce Potter OB: [birth year]  MR#: [id number]  [id number]  Patient Care Team: Maryland Pink, MD as PCP - General (Family Medicine)  CHIEF COMPLAINT:  Chief Complaint  Patient presents with  . Anemia    INTERVAL HISTORY: Patient returns to clinic today for further evaluation and consideration of IV iron. She currently feels well. She has no neurologic complaints. She denies any recent fevers. She has a fair appetite, but denies weight loss. She has no chest pain or shortness of breath. She denies any nausea, vomiting, constipation, or diarrhea. She has noted no melena or hematochezia. She has no urinary complaints. Patient offers no specific complaints today.   REVIEW OF SYSTEMS:   Review of Systems  Constitutional: Negative for fever, weight loss and malaise/fatigue.  Respiratory: Negative.  Negative for shortness of breath.   Cardiovascular: Negative.  Negative for chest pain.  Gastrointestinal: Negative.  Negative for abdominal pain, blood in stool and melena.  Musculoskeletal: Negative.   Neurological: Negative.  Negative for weakness.  Psychiatric/Behavioral: Negative.     As per HPI. Otherwise, a complete review of systems is negatve.  PAST MEDICAL HISTORY: Past Medical History  Diagnosis Date  . IDDM (insulin dependent diabetes mellitus) ([place])   . High cholesterol   . HTN (hypertension)   . Multiple thyroid nodules     Benign  . CAD (coronary artery disease)     s/p Left circumflex stent in 2012  . Cataract   . Peptic ulcer disease   . Aortic stenosis   . Hyperlipidemia   . Basal cell carcinoma   . Anemia     iron deficiency  . Retinal artery occlusion     on left  . Carotid stenosis     PAST SURGICAL HISTORY: Past Surgical History  Procedure Laterality Date  . Coronary stent placement    . Partial hysterectomy    . Givens capsule study N/A 04/17/2013    Procedure:  GIVENS CAPSULE STUDY;  Surgeon: Arta Silence, MD;  Location[facility] ENDOSCOPY;  Service: Endoscopy;  Laterality: N/A;  patient ate breakfast at 7am   . Breast biopsy    . Coronary angioplasty    . Eye surgery    . Trigger finger release    . Cardiac catheterization N/A 08/08/2015    Procedure: Right and Left Heart Cath and Coronary Angiography;  Surgeon: Teodoro Spray, MD;  Location: [facility] CV LAB;  Service: Cardiovascular;  Laterality: N/A;  . Endarterectomy Left 10/01/2015    Procedure: ENDARTERECTOMY CAROTID;  Surgeon: Algernon Huxley, MD;  Location: [facility] ORS;  Service: Vascular;  Laterality: Left;  . Cataract extraction w/ intraocular lens  implant, bilateral    . Artery biopsy Left 11/19/2015    Procedure: BIOPSY TEMPORAL ARTERY;  Surgeon: Algernon Huxley, MD;  Location: [facility] ORS;  Service: Vascular;  Laterality: Left;  . Esophagogastroduodenoscopy    . Colonoscopy      in 2013- internal hemorrhoids  . Esophagogastroduodenoscopy (egd) with propofol N/A 05/31/2016    Procedure: ESOPHAGOGASTRODUODENOSCOPY (EGD) WITH PROPOFOL;  Surgeon: Manya Silvas, MD;  Location: [place];  Service: Endoscopy;  Laterality: N/A;    FAMILY HISTORY Family History  Problem Relation Age of Onset  . Uterine cancer Mother   . Breast cancer Mother   . Colon cancer Neg Hx   . Seizures Father   . Stroke Father   . Diabetes Father   . COPD Father  ADVANCED DIRECTIVES:    HEALTH MAINTENANCE: Social History  Substance Use Topics  . Smoking status: Former Research scientist (life sciences)  . Smokeless tobacco: Not on file     Comment: quit about 31 years ago  . Alcohol Use: No     Colonoscopy:  PAP:  Bone density:  Lipid panel:  Allergies  Allergen Reactions  . Aspirin Other (See Comments)    ulcers  . Invokamet [Canagliflozin-Metformin Hcl] Other (See Comments)    Yeast infection  . Sulfa Antibiotics Other (See Comments)    "Got Drunk"  . Lovastatin Rash  . Penicillins Rash  . Vicodin  [Hydrocodone-Acetaminophen] Rash    Current Outpatient Prescriptions  Medication Sig Dispense Refill  . albuterol (PROAIR HFA) 108 (90 Base) MCG/ACT inhaler Inhale 2 puffs into the lungs every 4 (four) hours as needed. For shortness of breath/wheezing.    Marland Kitchen ascorbic acid (VITAMIN C) 500 MG tablet Take 500 mg by mouth daily.    . Biotin (RA BIOTIN) 1000 MCG tablet Take 1,000 mcg by mouth daily.    . ciprofloxacin (CIPRO) 250 MG tablet Take 1 tablet (250 mg total) by mouth 2 (two) times daily. 4 tablet 0  . Flaxseed, Linseed, (FLAXSEED OIL) 1000 MG CAPS Take 1,000 mg by mouth daily.    Marland Kitchen GARLIC PO Take 1 capsule by mouth daily.    . insulin NPH-regular Human (NOVOLIN 70/30) (70-30) 100 UNIT/ML injection Inject 50-56 Units into the skin See admin instructions. Inject 56 units subcutaneous every morning and Inject 50 units subcutaneous every day in the evening.    . insulin regular (HUMULIN R) 100 units/mL injection Inject into the skin See admin instructions. Inject 0-10 units subcutaneous 3 times daily before meals if blood sugar is over 150.    Marland Kitchen losartan (COZAAR) 100 MG tablet Take 100 mg by mouth daily.    . metoprolol (LOPRESSOR) 100 MG tablet Take 100 mg by mouth 2 (two) times daily.    . Multiple Vitamins-Minerals (MULTIVITAMIN WITH MINERALS) tablet Take 1 tablet by mouth daily.    Marland Kitchen omeprazole (PRILOSEC) 40 MG capsule Take 40 mg by mouth daily.    . pravastatin (PRAVACHOL) 10 MG tablet Take 1 tablet (10 mg total) by mouth daily at 6 PM. 30 tablet 0  . traMADol (ULTRAM) 50 MG tablet Take 1 tablet (50 mg total) by mouth every 6 (six) hours as needed for moderate pain. 30 tablet 0   No current facility-administered medications for this visit.    OBJECTIVE: Filed Vitals:   06/09/16 1411  BP: 126/65  Pulse: 74  Temp: 96.3 F (35.7 C)  Resp: 18     Body mass index is 36.85 kg/(m^2).    ECOG FS:0 - Asymptomatic  General: Well-developed, well-nourished, no acute distress. Eyes: Pink  conjunctiva, anicteric sclera. Lungs: Clear to auscultation bilaterally. Heart: Regular rate and rhythm. No rubs, murmurs, or gallops. Abdomen: Soft, nontender, nondistended. No organomegaly noted, normoactive bowel sounds. Musculoskeletal: No edema, cyanosis, or clubbing. Neuro: Alert, answering all questions appropriately. Cranial nerves grossly intact. Skin: No rashes or petechiae noted. Psych: Normal affect.   LAB RESULTS:  Lab Results  Component Value Date   NA 139 05/30/2016   K 4.0 05/30/2016   CL 108 05/30/2016   CO2 24 05/30/2016   GLUCOSE 183* 05/30/2016   BUN 32* 05/30/2016   CREATININE 1.04* 05/30/2016   CALCIUM 8.2* 05/30/2016   PROT 6.9 05/29/2016   ALBUMIN 3.9 05/29/2016   AST 38 05/29/2016   ALT 23 05/29/2016  ALKPHOS 71 05/29/2016   BILITOT 0.5 05/29/2016   GFRNONAA 54* 05/30/2016   GFRAA >60 05/30/2016    Lab Results  Component Value Date   WBC 6.3 06/01/2016   NEUTROABS 2.9 03/08/2016   HGB 9.5* 06/01/2016   HCT 28.5* 06/01/2016   MCV 83.9 06/01/2016   PLT 257 06/01/2016     STUDIES: Dg Chest 2 View  05/29/2016  CLINICAL DATA:  Chest pain shortness of breath EXAM: CHEST  2 VIEW COMPARISON:  09/28/2015 FINDINGS: Cardiomegaly evident with chronic vascular and interstitial prominence. No definite superimposed edema or CHF. No effusion or pneumothorax. Lingula bandlike density, suspect atelectasis versus scarring. Trachea is midline. Degenerative changes of the spine. No severe compression fracture or definite acute osseous finding. IMPRESSION: Cardiomegaly with vascular congestion Lingula atelectasis versus scarring. Electronically Signed   By: Jerilynn Mages.  Shick M.D.   On: 05/29/2016 11:46    ASSESSMENT: Iron deficiency anemia.  PLAN:    1. Iron deficiency anemia: Patient's hemoglobin and iron stores are decreased therefore will proceed with 510mg  IV Feraheme today. Return to clinic in 1 week to receive a second infusion.  Patient also has a colonoscopy  scheduled in the near future. She will then return to clinic in 3 months with repeat laboratory work and further evaluation.   2. Hypertension: Patient's blood pressure is within normal limits today, continue current medications as prescribed.    Patient expressed understanding and was in agreement with this plan. She also understands that She can call clinic at any time with any questions, concerns, or complaints.    Lloyd Huger, MD   06/19/2016 4:23 PM

## 2016-07-08 ENCOUNTER — Inpatient Hospital Stay: Payer: Medicare HMO

## 2016-07-08 ENCOUNTER — Inpatient Hospital Stay (HOSPITAL_BASED_OUTPATIENT_CLINIC_OR_DEPARTMENT_OTHER): Payer: Medicare HMO | Admitting: Oncology

## 2016-07-08 ENCOUNTER — Inpatient Hospital Stay: Payer: Medicare HMO | Attending: Oncology

## 2016-07-08 VITALS — BP 145/79 | HR 65 | Temp 97.2°F | Resp 18 | Wt 208.0 lb

## 2016-07-08 DIAGNOSIS — E7800 Pure hypercholesterolemia, unspecified: Secondary | ICD-10-CM | POA: Diagnosis not present

## 2016-07-08 DIAGNOSIS — D509 Iron deficiency anemia, unspecified: Secondary | ICD-10-CM | POA: Insufficient documentation

## 2016-07-08 DIAGNOSIS — Z955 Presence of coronary angioplasty implant and graft: Secondary | ICD-10-CM | POA: Diagnosis not present

## 2016-07-08 DIAGNOSIS — E119 Type 2 diabetes mellitus without complications: Secondary | ICD-10-CM | POA: Diagnosis not present

## 2016-07-08 DIAGNOSIS — Z87891 Personal history of nicotine dependence: Secondary | ICD-10-CM | POA: Diagnosis not present

## 2016-07-08 DIAGNOSIS — D649 Anemia, unspecified: Secondary | ICD-10-CM

## 2016-07-08 DIAGNOSIS — Z79899 Other long term (current) drug therapy: Secondary | ICD-10-CM | POA: Diagnosis not present

## 2016-07-08 DIAGNOSIS — I10 Essential (primary) hypertension: Secondary | ICD-10-CM

## 2016-07-08 DIAGNOSIS — I2510 Atherosclerotic heart disease of native coronary artery without angina pectoris: Secondary | ICD-10-CM | POA: Insufficient documentation

## 2016-07-08 DIAGNOSIS — Z794 Long term (current) use of insulin: Secondary | ICD-10-CM | POA: Diagnosis not present

## 2016-07-08 LAB — IRON AND TIBC
IRON: 40 ug/dL (ref 28–170)
Saturation Ratios: 15 % (ref 10.4–31.8)
TIBC: 265 ug/dL (ref 250–450)
UIBC: 225 ug/dL

## 2016-07-08 LAB — CBC WITH DIFFERENTIAL/PLATELET
BASOS PCT: 1 %
Basophils Absolute: 0 10*3/uL (ref 0–0.1)
Eosinophils Absolute: 0.3 10*3/uL (ref 0–0.7)
Eosinophils Relative: 6 %
HEMATOCRIT: 30.5 % — AB (ref 35.0–47.0)
Hemoglobin: 10.2 g/dL — ABNORMAL LOW (ref 12.0–16.0)
Lymphocytes Relative: 20 %
Lymphs Abs: 1 10*3/uL (ref 1.0–3.6)
MCH: 29.6 pg (ref 26.0–34.0)
MCHC: 33.6 g/dL (ref 32.0–36.0)
MCV: 88.1 fL (ref 80.0–100.0)
MONO ABS: 0.5 10*3/uL (ref 0.2–0.9)
MONOS PCT: 10 %
NEUTROS ABS: 3.3 10*3/uL (ref 1.4–6.5)
Neutrophils Relative %: 63 %
Platelets: 261 10*3/uL (ref 150–440)
RBC: 3.46 MIL/uL — ABNORMAL LOW (ref 3.80–5.20)
RDW: 17.4 % — AB (ref 11.5–14.5)
WBC: 5.2 10*3/uL (ref 3.6–11.0)

## 2016-07-08 LAB — CREATININE, SERUM: CREATININE: 0.69 mg/dL (ref 0.44–1.00)

## 2016-07-08 LAB — HEPATIC FUNCTION PANEL
ALBUMIN: 4.1 g/dL (ref 3.5–5.0)
ALK PHOS: 79 U/L (ref 38–126)
ALT: 26 U/L (ref 14–54)
AST: 25 U/L (ref 15–41)
Bilirubin, Direct: <0.1 mg/dL — ABNORMAL LOW (ref 0.1–0.5)
TOTAL PROTEIN: 7.5 g/dL (ref 6.5–8.1)
Total Bilirubin: 0.6 mg/dL (ref 0.3–1.2)

## 2016-07-08 LAB — FERRITIN: Ferritin: 132 ng/mL (ref 11–307)

## 2016-07-08 NOTE — Progress Notes (Signed)
States feels weak today and has been having episodes of dizziness. Has an appt with Dr. Tamala Julian on 7/24 for surgical evaluation regarding hemorrhoids.

## 2016-07-13 DIAGNOSIS — D500 Iron deficiency anemia secondary to blood loss (chronic): Secondary | ICD-10-CM | POA: Insufficient documentation

## 2016-07-13 NOTE — Progress Notes (Signed)
[place]  [phone]) [phone] [phone]  ID: Joyce Potter OB: 19-May-1946  MR#: [id number]  [id number]  Patient Care Team: [place] Pink, MD as PCP - General (Family Medicine)  CHIEF COMPLAINT: Iron deficiency anemia.  INTERVAL HISTORY: Patient returns to clinic today for further evaluation, laboratory work, and consideration of IV iron. She continues to have weakness and fatigue which is chronic and unchanged. She has occasional dizziness. She also has complaints of hemorrhoids, but is being evaluated by surgery in the next several weeks. She has no other neurologic complaints. She denies any recent fevers. She has a fair appetite, but denies weight loss. She has no chest pain or shortness of breath. She denies any nausea, vomiting, constipation, or diarrhea. She has noted no melena or hematochezia. She has no urinary complaints. Patient offers no further specific complaints today.   REVIEW OF SYSTEMS:   Review of Systems  Constitutional: Positive for malaise/fatigue. Negative for fever and weight loss.  Respiratory: Negative.  Negative for shortness of breath.   Cardiovascular: Negative.  Negative for chest pain.  Gastrointestinal: Negative.  Negative for abdominal pain, blood in stool and melena.  Musculoskeletal: Negative.   Neurological: Positive for dizziness and weakness.  Psychiatric/Behavioral: Negative.     As per HPI. Otherwise, a complete review of systems is negatve.  PAST MEDICAL HISTORY: Past Medical History  Diagnosis Date  . IDDM (insulin dependent diabetes mellitus) ([place])   . High cholesterol   . HTN (hypertension)   . Multiple thyroid nodules     Benign  . CAD (coronary artery disease)     s/p Left circumflex stent in 2012  . Cataract   . Peptic ulcer disease   . Aortic stenosis   . Hyperlipidemia   . Basal cell carcinoma   . Anemia     iron deficiency  . Retinal artery occlusion     on left  . Carotid  stenosis     PAST SURGICAL HISTORY: Past Surgical History  Procedure Laterality Date  . Coronary stent placement    . Partial hysterectomy    . Givens capsule study N/A 04/17/2013    Procedure: GIVENS CAPSULE STUDY;  Surgeon: Arta Silence, MD;  Location: [facility];  Service: Endoscopy;  Laterality: N/A;  patient ate breakfast at 7am   . Breast biopsy    . Coronary angioplasty    . Eye surgery    . Trigger finger release    . Cardiac catheterization N/A 08/08/2015    Procedure: Right and Left Heart Cath and Coronary Angiography;  Surgeon: Teodoro Spray, MD;  Location: [facility];  Service: Cardiovascular;  Laterality: N/A;  . Endarterectomy Left 10/01/2015    Procedure: ENDARTERECTOMY CAROTID;  Surgeon: Algernon Huxley, MD;  Location: [facility];  Service: Vascular;  Laterality: Left;  . Cataract extraction w/ intraocular lens  implant, bilateral    . Artery biopsy Left 11/19/2015    Procedure: BIOPSY TEMPORAL ARTERY;  Surgeon: Algernon Huxley, MD;  Location: [facility];  Service: Vascular;  Laterality: Left;  . Esophagogastroduodenoscopy    . Colonoscopy      in 2013- internal hemorrhoids  . Esophagogastroduodenoscopy (egd) with propofol N/A 05/31/2016    Procedure: ESOPHAGOGASTRODUODENOSCOPY (EGD) WITH PROPOFOL;  Surgeon: Manya Silvas, MD;  Location: [facility];  Service: Endoscopy;  Laterality: N/A;    FAMILY HISTORY Family History  Problem Relation Age of Onset  . Uterine cancer Mother   . Breast cancer Mother   .  Colon cancer Neg Hx   . Seizures Father   . Stroke Father   . Diabetes Father   . COPD Father        ADVANCED DIRECTIVES:    HEALTH MAINTENANCE: Social History  Substance Use Topics  . Smoking status: Former Research scientist (life sciences)  . Smokeless tobacco: Not on file     Comment: quit about 31 years ago  . Alcohol Use: No     Colonoscopy:  PAP:  Bone density:  Lipid panel:  Allergies  Allergen Reactions  . Aspirin Other (See Comments)    ulcers  .  Invokamet [Canagliflozin-Metformin Hcl] Other (See Comments)    Yeast infection  . Sulfa Antibiotics Other (See Comments)    "Got Drunk"  . Lovastatin Rash  . Penicillins Rash  . Vicodin [Hydrocodone-Acetaminophen] Rash    Current Outpatient Prescriptions  Medication Sig Dispense Refill  . albuterol (PROAIR HFA) 108 (90 Base) MCG/ACT inhaler Inhale 2 puffs into the lungs every 4 (four) hours as needed. For shortness of breath/wheezing.    Marland Kitchen ascorbic acid (VITAMIN C) 500 MG tablet Take 500 mg by mouth daily.    . Biotin (RA BIOTIN) 1000 MCG tablet Take 1,000 mcg by mouth daily.    . Black Pepper-Turmeric (TURMERIC COMPLEX/BLACK PEPPER PO) Take 1 capsule by mouth daily.    . Flaxseed, Linseed, (FLAXSEED OIL) 1000 MG CAPS Take 1,000 mg by mouth daily.    Marland Kitchen gabapentin (NEURONTIN) 300 MG capsule Take 1 capsule by mouth at bedtime.    Marland Kitchen GARLIC PO Take 1 capsule by mouth daily.    . insulin NPH-regular Human (NOVOLIN 70/30) (70-30) 100 UNIT/ML injection Inject 50-56 Units into the skin See admin instructions. Inject 56 units subcutaneous every morning and Inject 50 units subcutaneous every day in the evening.    . insulin regular (HUMULIN R) 100 units/mL injection Inject into the skin See admin instructions. Inject 0-10 units subcutaneous 3 times daily before meals if blood sugar is over 150.    Marland Kitchen losartan (COZAAR) 100 MG tablet Take 100 mg by mouth daily.    . metoprolol (LOPRESSOR) 100 MG tablet Take 100 mg by mouth 2 (two) times daily.    . Multiple Vitamins-Minerals (MULTIVITAMIN WITH MINERALS) tablet Take 1 tablet by mouth daily.    Marland Kitchen omeprazole (PRILOSEC) 40 MG capsule Take 40 mg by mouth daily.    . pravastatin (PRAVACHOL) 10 MG tablet Take 1 tablet (10 mg total) by mouth daily at 6 PM. 30 tablet 0  . traMADol (ULTRAM) 50 MG tablet Take 1 tablet (50 mg total) by mouth every 6 (six) hours as needed for moderate pain. 30 tablet 0   No current facility-administered medications for this visit.     OBJECTIVE: Filed Vitals:   07/08/16 1345  BP: 145/79  Pulse: 65  Temp: 97.2 F (36.2 C)  Resp: 18     Body mass index is 36.85 kg/(m^2).    ECOG FS:0 - Asymptomatic  General: Well-developed, well-nourished, no acute distress. Eyes: Pink conjunctiva, anicteric sclera. Lungs: Clear to auscultation bilaterally. Heart: Regular rate and rhythm. No rubs, murmurs, or gallops. Abdomen: Soft, nontender, nondistended. No organomegaly noted, normoactive bowel sounds. Musculoskeletal: No edema, cyanosis, or clubbing. Neuro: Alert, answering all questions appropriately. Cranial nerves grossly intact. Skin: No rashes or petechiae noted. Psych: Normal affect.   LAB RESULTS:  Lab Results  Component Value Date   NA 139 05/30/2016   K 4.0 05/30/2016   CL 108 05/30/2016   CO2 24  05/30/2016   GLUCOSE 183* 05/30/2016   BUN 32* 05/30/2016   CREATININE 0.69 07/08/2016   CALCIUM 8.2* 05/30/2016   PROT 7.5 07/08/2016   ALBUMIN 4.1 07/08/2016   AST 25 07/08/2016   ALT 26 07/08/2016   ALKPHOS 79 07/08/2016   BILITOT 0.6 07/08/2016   GFRNONAA >60 07/08/2016   GFRAA >60 07/08/2016    Lab Results  Component Value Date   WBC 5.2 07/08/2016   NEUTROABS 3.3 07/08/2016   HGB 10.2* 07/08/2016   HCT 30.5* 07/08/2016   MCV 88.1 07/08/2016   PLT 261 07/08/2016   Lab Results  Component Value Date   IRON 40 07/08/2016   TIBC 265 07/08/2016   IRONPCTSAT 15 07/08/2016    Lab Results  Component Value Date   FERRITIN 132 07/08/2016     STUDIES: No results found.  ASSESSMENT: Iron deficiency anemia.  PLAN:    1. Iron deficiency anemia: Patient's hemoglobin continues to be decreased, but has improved since receiving 2 infusions of Feraheme in June 2017. Her iron stores are now within normal limits. No further intervention is needed at this time. Patient has evaluation for hemorrhoids as well as a colonoscopy scheduled in the near future. She will then return to clinic in 3 months with  repeat laboratory work and further evaluation.   2. Hypertension: Patient's blood pressure is mildly elevated today, continue current medications as prescribed.   Patient expressed understanding and was in agreement with this plan. She also understands that She can call clinic at any time with any questions, concerns, or complaints.    Lloyd Huger, MD   07/13/2016 9:36 AM

## 2016-07-27 HISTORY — PX: HEMORRHOID BANDING: SHX5850

## 2016-08-23 ENCOUNTER — Other Ambulatory Visit: Payer: Self-pay | Admitting: *Deleted

## 2016-08-23 ENCOUNTER — Other Ambulatory Visit: Payer: Self-pay | Admitting: Oncology

## 2016-08-23 ENCOUNTER — Telehealth: Payer: Self-pay | Admitting: *Deleted

## 2016-08-23 ENCOUNTER — Inpatient Hospital Stay: Payer: Medicare HMO

## 2016-08-23 DIAGNOSIS — I350 Nonrheumatic aortic (valve) stenosis: Secondary | ICD-10-CM | POA: Insufficient documentation

## 2016-08-23 DIAGNOSIS — E7800 Pure hypercholesterolemia, unspecified: Secondary | ICD-10-CM | POA: Diagnosis not present

## 2016-08-23 DIAGNOSIS — I10 Essential (primary) hypertension: Secondary | ICD-10-CM | POA: Insufficient documentation

## 2016-08-23 DIAGNOSIS — Z79899 Other long term (current) drug therapy: Secondary | ICD-10-CM | POA: Diagnosis not present

## 2016-08-23 DIAGNOSIS — R079 Chest pain, unspecified: Secondary | ICD-10-CM | POA: Diagnosis not present

## 2016-08-23 DIAGNOSIS — Z87891 Personal history of nicotine dependence: Secondary | ICD-10-CM | POA: Diagnosis not present

## 2016-08-23 DIAGNOSIS — D509 Iron deficiency anemia, unspecified: Secondary | ICD-10-CM

## 2016-08-23 DIAGNOSIS — K921 Melena: Secondary | ICD-10-CM | POA: Diagnosis not present

## 2016-08-23 DIAGNOSIS — E119 Type 2 diabetes mellitus without complications: Secondary | ICD-10-CM | POA: Insufficient documentation

## 2016-08-23 DIAGNOSIS — I2510 Atherosclerotic heart disease of native coronary artery without angina pectoris: Secondary | ICD-10-CM | POA: Diagnosis not present

## 2016-08-23 DIAGNOSIS — E785 Hyperlipidemia, unspecified: Secondary | ICD-10-CM | POA: Diagnosis not present

## 2016-08-23 LAB — CBC WITH DIFFERENTIAL/PLATELET
BASOS ABS: 0 10*3/uL (ref 0–0.1)
BASOS PCT: 1 %
EOS ABS: 0.2 10*3/uL (ref 0–0.7)
Eosinophils Relative: 5 %
HCT: 21.7 % — ABNORMAL LOW (ref 35.0–47.0)
Hemoglobin: 7.1 g/dL — ABNORMAL LOW (ref 12.0–16.0)
Lymphocytes Relative: 26 %
Lymphs Abs: 1.1 10*3/uL (ref 1.0–3.6)
MCH: 26.3 pg (ref 26.0–34.0)
MCHC: 32.7 g/dL (ref 32.0–36.0)
MCV: 80.4 fL (ref 80.0–100.0)
MONO ABS: 0.4 10*3/uL (ref 0.2–0.9)
MONOS PCT: 10 %
NEUTROS ABS: 2.5 10*3/uL (ref 1.4–6.5)
NEUTROS PCT: 58 %
Platelets: 307 10*3/uL (ref 150–440)
RBC: 2.7 MIL/uL — ABNORMAL LOW (ref 3.80–5.20)
RDW: 17.2 % — AB (ref 11.5–14.5)
WBC: 4.3 10*3/uL (ref 3.6–11.0)

## 2016-08-23 LAB — IRON AND TIBC
Iron: 14 ug/dL — ABNORMAL LOW (ref 28–170)
SATURATION RATIOS: 4 % — AB (ref 10.4–31.8)
TIBC: 349 ug/dL (ref 250–450)
UIBC: 335 ug/dL

## 2016-08-23 LAB — FERRITIN: Ferritin: 11 ng/mL (ref 11–307)

## 2016-08-23 NOTE — Telephone Encounter (Signed)
Agrees to appt at 1145 today

## 2016-08-23 NOTE — Telephone Encounter (Signed)
States she feels like her iron levels are low and asking is she can come in to have her labs checked since she has heart problems when it drops. Please advise.

## 2016-08-23 NOTE — Progress Notes (Signed)
[place]  Telephone:([phone]) [phone] [phone]) [phone]  ID: Joyce Potter OB: Aug 11, 1946  MR#: [id number]  [id number]  Patient Care Team: Maryland Pink, MD as PCP - General (Family Medicine)  CHIEF COMPLAINT: Iron deficiency anemia.  INTERVAL HISTORY: Patient returns to clinic today as an add-on with increasing weakness and fatigue, shortness of breath, and chest pain. She otherwise feels well. She continues to have occasional dizziness, but no other neurologic complaints. She denies any recent fevers. She has a fair appetite, but denies weight loss. She has no cough or hemoptysis. She denies any nausea, vomiting, constipation, or diarrhea. She continues to have melena. She has no urinary complaints. Patient offers no further specific complaints today.   REVIEW OF SYSTEMS:   Review of Systems  Constitutional: Positive for malaise/fatigue. Negative for fever and weight loss.  Respiratory: Positive for shortness of breath. Negative for cough and hemoptysis.   Cardiovascular: Positive for chest pain.  Gastrointestinal: Positive for melena. Negative for abdominal pain and blood in stool.  Musculoskeletal: Negative.   Neurological: Positive for dizziness and weakness.  Psychiatric/Behavioral: The patient is nervous/anxious.     As per HPI. Otherwise, a complete review of systems is negatve.  PAST MEDICAL HISTORY: Past Medical History:  Diagnosis Date  . Anemia    iron deficiency  . Aortic stenosis   . Basal cell carcinoma   . CAD (coronary artery disease)    s/p Left circumflex stent in 2012  . Carotid stenosis   . Cataract   . High cholesterol   . HTN (hypertension)   . Hyperlipidemia   . IDDM (insulin dependent diabetes mellitus) ([place])   . Multiple thyroid nodules    Benign  . Peptic ulcer disease   . Retinal artery occlusion    on left    PAST SURGICAL HISTORY: Past Surgical History:  Procedure Laterality Date  . ARTERY BIOPSY Left  11/19/2015   Procedure: BIOPSY TEMPORAL ARTERY;  Surgeon: Algernon Huxley, MD;  Location: [facility];  Service: Vascular;  Laterality: Left;  . BREAST BIOPSY    . CARDIAC CATHETERIZATION N/A 08/08/2015   Procedure: Right and Left Heart Cath and Coronary Angiography;  Surgeon: Teodoro Spray, MD;  Location: [facility];  Service: Cardiovascular;  Laterality: N/A;  . CATARACT EXTRACTION W/ INTRAOCULAR LENS  IMPLANT, BILATERAL    . COLONOSCOPY     in 2013- internal hemorrhoids  . CORONARY ANGIOPLASTY    . CORONARY STENT PLACEMENT    . ENDARTERECTOMY Left 10/01/2015   Procedure: ENDARTERECTOMY CAROTID;  Surgeon: Algernon Huxley, MD;  Location: [facility];  Service: Vascular;  Laterality: Left;  . ESOPHAGOGASTRODUODENOSCOPY    . ESOPHAGOGASTRODUODENOSCOPY (EGD) WITH PROPOFOL N/A 05/31/2016   Procedure: ESOPHAGOGASTRODUODENOSCOPY (EGD) WITH PROPOFOL;  Surgeon: Manya Silvas, MD;  Location: [facility];  Service: Endoscopy;  Laterality: N/A;  . EYE SURGERY    . GIVENS CAPSULE STUDY N/A 04/17/2013   Procedure: GIVENS CAPSULE STUDY;  Surgeon: Arta Silence, MD;  Location: [facility];  Service: Endoscopy;  Laterality: N/A;  patient ate breakfast at 7am   . PARTIAL HYSTERECTOMY    . TRIGGER FINGER RELEASE      FAMILY HISTORY Family History  Problem Relation Age of Onset  . Uterine cancer Mother   . Breast cancer Mother   . Seizures Father   . Stroke Father   . Diabetes Father   . COPD Father   . Colon cancer Neg Hx  ADVANCED DIRECTIVES:    HEALTH MAINTENANCE: Social History  Substance Use Topics  . Smoking status: Former Research scientist (life sciences)  . Smokeless tobacco: Never Used     Comment: quit about 31 years ago  . Alcohol use No     Colonoscopy:  PAP:  Bone density:  Lipid panel:  Allergies  Allergen Reactions  . Aspirin Other (See Comments)    ulcers  . Invokamet [Canagliflozin-Metformin Hcl] Other (See Comments)    Yeast infection  . Sulfa Antibiotics Other (See Comments)     "Got Drunk"  . Lovastatin Rash  . Penicillins Rash  . Vicodin [Hydrocodone-Acetaminophen] Rash    Current Outpatient Prescriptions  Medication Sig Dispense Refill  . albuterol (PROAIR HFA) 108 (90 Base) MCG/ACT inhaler Inhale 2 puffs into the lungs every 4 (four) hours as needed. For shortness of breath/wheezing.    Marland Kitchen ascorbic acid (VITAMIN C) 500 MG tablet Take 500 mg by mouth daily.    . Biotin (RA BIOTIN) 1000 MCG tablet Take 1,000 mcg by mouth daily.    . Black Pepper-Turmeric (TURMERIC COMPLEX/BLACK PEPPER PO) Take 1 capsule by mouth daily.    . Flaxseed, Linseed, (FLAXSEED OIL) 1000 MG CAPS Take 1,000 mg by mouth daily.    Marland Kitchen gabapentin (NEURONTIN) 300 MG capsule Take 1 capsule by mouth at bedtime.    Marland Kitchen GARLIC PO Take 1 capsule by mouth daily.    . insulin NPH-regular Human (NOVOLIN 70/30) (70-30) 100 UNIT/ML injection Inject 50-56 Units into the skin See admin instructions. Inject 56 units subcutaneous every morning and Inject 50 units subcutaneous every day in the evening.    Marland Kitchen losartan (COZAAR) 100 MG tablet Take 100 mg by mouth daily.    . metoprolol (LOPRESSOR) 100 MG tablet Take 100 mg by mouth 2 (two) times daily.    . Multiple Vitamins-Minerals (MULTIVITAMIN WITH MINERALS) tablet Take 1 tablet by mouth daily.    Marland Kitchen omeprazole (PRILOSEC) 40 MG capsule Take 40 mg by mouth daily.    . pravastatin (PRAVACHOL) 10 MG tablet Take 1 tablet (10 mg total) by mouth daily at 6 PM. 30 tablet 0  . traMADol (ULTRAM) 50 MG tablet Take 1 tablet (50 mg total) by mouth every 6 (six) hours as needed for moderate pain. 30 tablet 0   No current facility-administered medications for this visit.     OBJECTIVE: Vitals:   08/24/16 0909  BP: 120/69  Pulse: (!) 101  Resp: 16  Temp: (!) 95.9 F (35.5 C)     Body mass index is 37.2 kg/m.    ECOG FS:0 - Asymptomatic  General: Well-developed, well-nourished, no acute distress. Eyes: Pink conjunctiva, anicteric sclera. Lungs: Clear to auscultation  bilaterally. Heart: Regular rate and rhythm. No rubs, murmurs, or gallops. Abdomen: Soft, nontender, nondistended. No organomegaly noted, normoactive bowel sounds. Musculoskeletal: No edema, cyanosis, or clubbing. Neuro: Alert, answering all questions appropriately. Cranial nerves grossly intact. Skin: No rashes or petechiae noted. Psych: Normal affect.   LAB RESULTS:  Lab Results  Component Value Date   NA 139 05/30/2016   K 4.0 05/30/2016   CL 108 05/30/2016   CO2 24 05/30/2016   GLUCOSE 183 (H) 05/30/2016   BUN 32 (H) 05/30/2016   CREATININE 0.69 07/08/2016   CALCIUM 8.2 (L) 05/30/2016   PROT 7.5 07/08/2016   ALBUMIN 4.1 07/08/2016   AST 25 07/08/2016   ALT 26 07/08/2016   ALKPHOS 79 07/08/2016   BILITOT 0.6 07/08/2016   GFRNONAA >60 07/08/2016   GFRAA >60  07/08/2016    Lab Results  Component Value Date   WBC 4.3 08/23/2016   NEUTROABS 2.5 08/23/2016   HGB 7.1 (L) 08/23/2016   HCT 21.7 (L) 08/23/2016   MCV 80.4 08/23/2016   PLT 307 08/23/2016   Lab Results  Component Value Date   IRON 14 (L) 08/23/2016   TIBC 349 08/23/2016   IRONPCTSAT 4 (L) 08/23/2016    Lab Results  Component Value Date   FERRITIN 11 08/23/2016     STUDIES: No results found.  ASSESSMENT: Iron deficiency anemia.  PLAN:    1. Iron deficiency anemia: Patient's hemoglobin and iron stores have significantly declined and she is symptomatic with shortness of breath and chest pain. Proceed with 1 unit of packed red blood cells today. Patient has been instructed to call clinic if she continues to be symptomatic so that she can receive a second unit. If her symptoms improve, she has been instructed to keep her previously scheduled follow-up appointment in October 2017. 2. Melena: Patient has had multiple EGDs, colonoscopies, and capsule endoscopies with no obvious the etiology of her bleeding. Monitor and continue evaluation by GI. 3. Chest pain/shortness of breath: Patient has an appointment  with cardiology tomorrow. 4. Hypertension: Patient's blood pressure is within normal limits, continue current medications as prescribed.    Patient expressed understanding and was in agreement with this plan. She also understands that She can call clinic at any time with any questions, concerns, or complaints.    Lloyd Huger, MD   08/24/2016 9:37 AM

## 2016-08-24 ENCOUNTER — Inpatient Hospital Stay: Payer: Medicare HMO

## 2016-08-24 ENCOUNTER — Ambulatory Visit: Payer: Self-pay

## 2016-08-24 ENCOUNTER — Encounter: Payer: Self-pay | Admitting: Oncology

## 2016-08-24 ENCOUNTER — Inpatient Hospital Stay: Payer: Medicare HMO | Attending: Oncology | Admitting: Oncology

## 2016-08-24 VITALS — BP 120/69 | HR 101 | Temp 95.9°F | Resp 16 | Ht 63.0 in | Wt 210.0 lb

## 2016-08-24 DIAGNOSIS — D509 Iron deficiency anemia, unspecified: Secondary | ICD-10-CM

## 2016-08-24 DIAGNOSIS — I10 Essential (primary) hypertension: Secondary | ICD-10-CM

## 2016-08-24 DIAGNOSIS — K921 Melena: Secondary | ICD-10-CM | POA: Diagnosis not present

## 2016-08-24 DIAGNOSIS — R079 Chest pain, unspecified: Secondary | ICD-10-CM | POA: Diagnosis not present

## 2016-08-24 DIAGNOSIS — Z79899 Other long term (current) drug therapy: Secondary | ICD-10-CM

## 2016-08-24 LAB — PREPARE RBC (CROSSMATCH)

## 2016-08-24 MED ORDER — SODIUM CHLORIDE 0.9 % IV SOLN
250.0000 mL | Freq: Once | INTRAVENOUS | Status: AC
  Administered 2016-08-24: 250 mL via INTRAVENOUS
  Filled 2016-08-24: qty 250

## 2016-08-24 MED ORDER — ACETAMINOPHEN 325 MG PO TABS
650.0000 mg | ORAL_TABLET | Freq: Once | ORAL | Status: AC
  Administered 2016-08-24: 650 mg via ORAL
  Filled 2016-08-24: qty 2

## 2016-08-24 MED ORDER — DIPHENHYDRAMINE HCL 50 MG/ML IJ SOLN
25.0000 mg | Freq: Once | INTRAMUSCULAR | Status: AC
  Administered 2016-08-24: 25 mg via INTRAVENOUS
  Filled 2016-08-24: qty 1

## 2016-08-24 NOTE — Progress Notes (Signed)
Pt reports always being fatigued.  Sees Dr/ Rochel Brome and Hemerroids banded.  Wishes she knew what was causing the bleeding.

## 2016-08-25 LAB — TYPE AND SCREEN
ABO/RH(D): O POS
ANTIBODY SCREEN: NEGATIVE
Unit division: 0

## 2016-08-26 ENCOUNTER — Other Ambulatory Visit: Payer: Self-pay | Admitting: Cardiology

## 2016-09-03 ENCOUNTER — Telehealth: Payer: Self-pay | Admitting: *Deleted

## 2016-09-03 ENCOUNTER — Encounter
Admission: RE | Disposition: A | Discharge status: Home / Self Care | Payer: Self-pay | Source: Ambulatory Visit | Attending: Cardiology

## 2016-09-03 ENCOUNTER — Ambulatory Visit
Admission: RE | Admit: 2016-09-03 | Discharge: 2016-09-03 | Disposition: A | Discharge status: Home / Self Care | Payer: Medicare HMO | Source: Ambulatory Visit | Attending: Cardiology | Admitting: Cardiology

## 2016-09-03 DIAGNOSIS — Z8711 Personal history of peptic ulcer disease: Secondary | ICD-10-CM | POA: Insufficient documentation

## 2016-09-03 DIAGNOSIS — Z8249 Family history of ischemic heart disease and other diseases of the circulatory system: Secondary | ICD-10-CM | POA: Insufficient documentation

## 2016-09-03 DIAGNOSIS — Z833 Family history of diabetes mellitus: Secondary | ICD-10-CM | POA: Diagnosis not present

## 2016-09-03 DIAGNOSIS — E042 Nontoxic multinodular goiter: Secondary | ICD-10-CM | POA: Insufficient documentation

## 2016-09-03 DIAGNOSIS — Z85828 Personal history of other malignant neoplasm of skin: Secondary | ICD-10-CM | POA: Insufficient documentation

## 2016-09-03 DIAGNOSIS — E785 Hyperlipidemia, unspecified: Secondary | ICD-10-CM | POA: Insufficient documentation

## 2016-09-03 DIAGNOSIS — R002 Palpitations: Secondary | ICD-10-CM | POA: Insufficient documentation

## 2016-09-03 DIAGNOSIS — Z888 Allergy status to other drugs, medicaments and biological substances status: Secondary | ICD-10-CM | POA: Insufficient documentation

## 2016-09-03 DIAGNOSIS — E119 Type 2 diabetes mellitus without complications: Secondary | ICD-10-CM | POA: Diagnosis not present

## 2016-09-03 DIAGNOSIS — Z823 Family history of stroke: Secondary | ICD-10-CM | POA: Insufficient documentation

## 2016-09-03 DIAGNOSIS — I350 Nonrheumatic aortic (valve) stenosis: Secondary | ICD-10-CM | POA: Diagnosis present

## 2016-09-03 DIAGNOSIS — I2510 Atherosclerotic heart disease of native coronary artery without angina pectoris: Secondary | ICD-10-CM | POA: Insufficient documentation

## 2016-09-03 DIAGNOSIS — Z882 Allergy status to sulfonamides status: Secondary | ICD-10-CM | POA: Insufficient documentation

## 2016-09-03 DIAGNOSIS — Z9841 Cataract extraction status, right eye: Secondary | ICD-10-CM | POA: Diagnosis not present

## 2016-09-03 DIAGNOSIS — Z8262 Family history of osteoporosis: Secondary | ICD-10-CM | POA: Insufficient documentation

## 2016-09-03 DIAGNOSIS — I352 Nonrheumatic aortic (valve) stenosis with insufficiency: Secondary | ICD-10-CM | POA: Diagnosis not present

## 2016-09-03 DIAGNOSIS — Z90710 Acquired absence of both cervix and uterus: Secondary | ICD-10-CM | POA: Diagnosis not present

## 2016-09-03 DIAGNOSIS — Z794 Long term (current) use of insulin: Secondary | ICD-10-CM | POA: Insufficient documentation

## 2016-09-03 DIAGNOSIS — Z853 Personal history of malignant neoplasm of breast: Secondary | ICD-10-CM | POA: Insufficient documentation

## 2016-09-03 DIAGNOSIS — Z79899 Other long term (current) drug therapy: Secondary | ICD-10-CM | POA: Diagnosis not present

## 2016-09-03 DIAGNOSIS — Z87891 Personal history of nicotine dependence: Secondary | ICD-10-CM | POA: Diagnosis not present

## 2016-09-03 DIAGNOSIS — Z880 Allergy status to penicillin: Secondary | ICD-10-CM | POA: Diagnosis not present

## 2016-09-03 DIAGNOSIS — Z9842 Cataract extraction status, left eye: Secondary | ICD-10-CM | POA: Insufficient documentation

## 2016-09-03 DIAGNOSIS — D509 Iron deficiency anemia, unspecified: Secondary | ICD-10-CM | POA: Diagnosis not present

## 2016-09-03 DIAGNOSIS — Z885 Allergy status to narcotic agent status: Secondary | ICD-10-CM | POA: Insufficient documentation

## 2016-09-03 DIAGNOSIS — I10 Essential (primary) hypertension: Secondary | ICD-10-CM | POA: Insufficient documentation

## 2016-09-03 HISTORY — PX: CARDIAC CATHETERIZATION: SHX172

## 2016-09-03 LAB — CBC
HEMATOCRIT: 22.5 % — AB (ref 35.0–47.0)
HEMOGLOBIN: 7.3 g/dL — AB (ref 12.0–16.0)
MCH: 25.1 pg — AB (ref 26.0–34.0)
MCHC: 32.3 g/dL (ref 32.0–36.0)
MCV: 77.9 fL — AB (ref 80.0–100.0)
PLATELETS: 280 10*3/uL (ref 150–440)
RBC: 2.89 MIL/uL — AB (ref 3.80–5.20)
RDW: 18.1 % — AB (ref 11.5–14.5)
WBC: 4.3 10*3/uL (ref 3.6–11.0)

## 2016-09-03 LAB — CARDIAC CATHETERIZATION: CATHEFQUANT: 65 %

## 2016-09-03 SURGERY — RIGHT/LEFT HEART CATH AND CORONARY ANGIOGRAPHY
Anesthesia: Moderate Sedation | Laterality: Bilateral

## 2016-09-03 MED ORDER — SODIUM CHLORIDE 0.9 % WEIGHT BASED INFUSION
1.0000 mL/kg/h | INTRAVENOUS | Status: DC
  Administered 2016-09-03: 1 mL/kg/h via INTRAVENOUS

## 2016-09-03 MED ORDER — SODIUM CHLORIDE 0.9% FLUSH: 3.0000 mL | INTRAVENOUS | Status: DC | PRN

## 2016-09-03 MED ORDER — FENTANYL CITRATE (PF) 100 MCG/2ML IJ SOLN
INTRAMUSCULAR | Status: DC | PRN
  Administered 2016-09-03: 25 ug via INTRAVENOUS

## 2016-09-03 MED ORDER — MIDAZOLAM HCL 2 MG/2ML IJ SOLN
INTRAMUSCULAR | Status: DC | PRN
  Administered 2016-09-03: 1 mg via INTRAVENOUS

## 2016-09-03 MED ORDER — SODIUM CHLORIDE 0.9 % IV SOLN: 250.0000 mL | INTRAVENOUS | Status: DC | PRN

## 2016-09-03 MED ORDER — SODIUM CHLORIDE 0.9% FLUSH: 3.0000 mL | Freq: Two times a day (BID) | INTRAVENOUS | Status: DC

## 2016-09-03 MED ORDER — MIDAZOLAM HCL 2 MG/2ML IJ SOLN
INTRAMUSCULAR | Status: AC
  Filled 2016-09-03: qty 2

## 2016-09-03 MED ORDER — SODIUM CHLORIDE 0.9 % IV SOLN
INTRAVENOUS | Status: DC
  Administered 2016-09-03: 08:00:00 via INTRAVENOUS

## 2016-09-03 MED ORDER — SODIUM CHLORIDE 0.9 % WEIGHT BASED INFUSION: 1.0000 mL/kg/h | INTRAVENOUS | Status: DC

## 2016-09-03 MED ORDER — HEPARIN (PORCINE) IN NACL 2-0.9 UNIT/ML-% IJ SOLN
INTRAMUSCULAR | Status: AC
  Filled 2016-09-03: qty 500

## 2016-09-03 MED ORDER — SODIUM CHLORIDE 0.9 % IV SOLN
250.0000 mL | INTRAVENOUS | Status: DC | PRN
Start: 2016-09-03 — End: 2016-09-03

## 2016-09-03 MED ORDER — IOPAMIDOL (ISOVUE-300) INJECTION 61%
INTRAVENOUS | Status: DC | PRN
  Administered 2016-09-03: 70 mL via INTRA_ARTERIAL

## 2016-09-03 MED ORDER — ASPIRIN 81 MG PO CHEW: 81.0000 mg | CHEWABLE_TABLET | ORAL | Status: DC

## 2016-09-03 MED ORDER — FENTANYL CITRATE (PF) 100 MCG/2ML IJ SOLN
INTRAMUSCULAR | Status: AC
  Filled 2016-09-03: qty 2

## 2016-09-03 MED ORDER — SODIUM CHLORIDE 0.9 % WEIGHT BASED INFUSION: 3.0000 mL/kg/h | INTRAVENOUS | Status: DC

## 2016-09-03 SURGICAL SUPPLY — 15 items
CATH 5FR JL4 DIAGNOSTIC (CATHETERS) ×2 IMPLANT
CATH 5FR JR4 DIAGNOSTIC (CATHETERS) ×2 IMPLANT
CATH INFINITI 5FR ANG PIGTAIL (CATHETERS) ×2 IMPLANT
CATH INFINITI 5FR JL5 (CATHETERS) ×2 IMPLANT
DEVICE CLOSURE MYNXGRIP 6/7F (Vascular Products) ×2 IMPLANT
GUIDEWIRE EMER 3M J .025X150CM (WIRE) ×2 IMPLANT
KIT MANI 3VAL PERCEP (MISCELLANEOUS) ×2 IMPLANT
KIT RIGHT HEART (MISCELLANEOUS) ×2 IMPLANT
NEEDLE PERC 18GX7CM (NEEDLE) ×2 IMPLANT
PACK CARDIAC CATH (CUSTOM PROCEDURE TRAY) ×2 IMPLANT
SHEATH AVANTI 5FR X 11CM (SHEATH) IMPLANT
SHEATH BRITE TIP 6FRX11 (SHEATH) ×2 IMPLANT
SHEATH PINNACLE 7F 10CM (SHEATH) ×2 IMPLANT
WIRE EMERALD 3MM-J .035X150CM (WIRE) ×2 IMPLANT
WIRE EMERALD ST .035X150CM (WIRE) ×2 IMPLANT

## 2016-09-03 NOTE — Telephone Encounter (Signed)
Message has been sent to scheduling to add her on for 2 units blood on Tuesday. They will notify her with appt.

## 2016-09-03 NOTE — H&P (Signed)
Chief Complaint: Chief Complaint  Patient presents with  . Follow-up  echo and myoview on 08-17-2016  . Jaw Pain  Pt states increased jaw pain and fatigue "it feels like it did when I got my stent placed."  Date of Service: 08/26/2016 Date of Birth: 1946-10-12 PCP: Macie Burows, MD  History of Present Illness: [name] is a 70 y.o.female patient who has a history of aortic stenosis which is mild-to-moderate with an aortic valve area of 1.1 cm2 with a peak gradient of 27.5 mm and a mean gradient 13.2 mm by recent echo. She has been evaluated with a left cardiac catheterization. This revealed small-vessel disease with preserved LV function. Her aortic valve does not appear to be critical. She had a hemorrhoids banded. Her hemoglobin continues to slowly drift down. She had blood transfusion yesterday for hemoglobin of 7.1. She states she does not feel any better. She complains of weakness, fatigue, shortness of breath, insomnia. Echocardiogram done recently revealed mild to moderate aortic stenosis with a valve area of 1.1 cm with peak gradient of 42 mmHg and a mean gradient of 22.7 mmHg. She had mild MR mild TR with normal LV function. Functional study showed mild inferior changes consistent with possible ischemia. Patient continues to have symptoms. Will need to proceed with left cardiac cath to evaluate coronary anatomy. This will be complicated somewhat due to limitations in antiplatelet therapy due to her blood loss. Past Medical and Surgical History  Past Medical History Past Medical History:  Diagnosis Date  . Aortic stenosis, moderate  . Basal cell carcinoma  . CAD (coronary artery disease)  3.0by 18 mm vision stent in the mid circumflex  . Cataract cortical, senile  . DM2 (diabetes mellitus, type 2) (CMS-HCC)  . Encounter for blood transfusion  . HTN (hypertension)  . Hyperlipidemia  . Iron deficiency anemia  a. hospitalization and 2U PRBC transfusion 10/2012  .  Multinodular goiter  a. biopsy of nodule 06/2012 was benign  . Palpitations  . PUD (peptic ulcer disease)  History of a bleeding ulcer   Past Surgical History She has a past surgical history that includes capsule endoscopy (N/A, 05/09/2013); esophagogastrodoudenoscopy w/biopsy (05/09/2013); Abdominal hysterectomy; Breast excisional biopsy; Cataract extraction (Bilateral); Incision Tendon Sheath For Trigger Finger (Bilateral); Coronary angioplasty; Stress test; Echocardiogram; colonoscopy (12/04/2012 (Inpt)); egd (04/04/2013); Basal cell removed; Trigger finger bilateral thumb; Carotid endarterectomy (Left, 07/2015); and egd (05/31/2016 (Inpt)).   Medications and Allergies  Current Medications  Current Outpatient Prescriptions  Medication Sig Dispense Refill  . albuterol 90 mcg/actuation inhaler Inhale 2 inhalations into the lungs every 4 (four) hours as needed. 200 inhalation 2  . ascorbic acid (VITAMIN C) 500 MG tablet Take 500 mg by mouth once daily.  Marland Kitchen BIOTIN ORAL Take 1 tablet by mouth once daily.  . blood glucose diagnostic test strip Use 2 (two) times daily. Use as instructed. 100 each 5  . FLAXSEED OIL ORAL Take 1 capsule by mouth once daily.  Marland Kitchen gabapentin (NEURONTIN) 300 MG capsule Take 1 capsule (300 mg total) by mouth nightly. 30 capsule 5  . GARLIC ORAL Take by mouth daily.  Marland Kitchen HUMULIN R U-100 100 unit/mL injection INJECT 0-10 UNITS SUB-Q 3 TIMES DAILY BEFORE MEALS AS DIRECTED 10 mL 3  . insulin syringe-needle U-100 (INSULIN SYRINGE) 1 mL 30 gauge x 5/16 Syrg USE ONE TWICE DAILY WITH INSULIN 200 each 3  . lancets Use 1 each 2 (two) times daily. Use as instructed. 100 each 5  . losartan (  COZAAR) 100 MG tablet Take 1 tablet (100 mg total) by mouth once daily. 90 tablet 3  . metoprolol tartrate (LOPRESSOR) 100 MG tablet Take 1 tablet (100 mg total) by mouth 2 (two) times daily. 90 tablet 3  . multivitamin with iron/minerals (THERA-M,THERADEX-M) 27-0.4 mg tablet Take 1 tablet by mouth  daily. 30 tablet 0  . omeprazole (PRILOSEC) 40 MG DR capsule Take 1 capsule (40 mg total) by mouth once daily. 90 capsule 2  . pravastatin (PRAVACHOL) 10 MG tablet Take 1 tablet (10 mg total) by mouth nightly. 30 tablet 5  . traMADol (ULTRAM) 50 mg tablet Take 50 mg by mouth every 6 (six) hours as needed for Pain.  . traMADol (ULTRAM) 50 mg tablet Take 1 tablet (50 mg total) by mouth 4 (four) times daily as needed for Pain for up to 12 doses. 12 tablet 0  . TURMERIC ROOT EXTRACT ORAL Take by mouth.  . insulin NPH-REGULAR (NOVOLIN 70/30) 100 unit/mL (70-30) injection Inject 56 Units subcutaneously every morning before breakfast. Inject 50 units before supper (Patient not taking: Reported on 08/26/2016 ) 90 mL 3   No current facility-administered medications for this visit.   Allergies: Invokana [canagliflozin]; Lovastatin; Penicillins; Sulfa (sulfonamide antibiotics); and Vicodin [hydrocodone-acetaminophen]  Social and Family History  Social History reports that she quit smoking about 31 years ago. She has never used smokeless tobacco. She reports that she does not drink alcohol or use illicit drugs.  Family History Family History  Problem Relation Age of Onset  . Coronary artery disease Mother  . Diabetes type II Mother  . Hypertension Mother  . Osteoporosis Mother  . Breast cancer Mother  . Coronary artery disease Father  . Hypertension Father  . Osteoporosis Father  . Stroke Father  . Diabetes type II Father  . Diabetes type II Sister  . Hypertension Brother   Review of Systems  Review of Systems  Constitutional: Positive for malaise/fatigue. Negative for chills, diaphoresis, fever and weight loss.  HENT: Negative for congestion, ear discharge and tinnitus.  Eyes: Negative for blurred vision.  Respiratory: Positive for shortness of breath. Negative for cough, hemoptysis, sputum production and wheezing.  Cardiovascular: Positive for chest pain and palpitations. Negative for  orthopnea, claudication, leg swelling and PND.  Gastrointestinal: Negative for blood in stool, constipation, diarrhea, melena and vomiting.  Genitourinary: Negative for dysuria, frequency, hematuria and urgency.  Musculoskeletal: Positive for back pain and myalgias. Negative for falls and joint pain.  Skin: Negative for itching and rash.  Neurological: Positive for weakness. Negative for dizziness, tingling, focal weakness and loss of consciousness.  Endo/Heme/Allergies: Negative for polydipsia. Does not bruise/bleed easily.  Psychiatric/Behavioral: Negative for depression, memory loss and substance abuse. The patient has insomnia. The patient is not nervous/anxious.    Physical Examination   Vitals: BP (P) 112/60 (BP Location: Left upper arm, Patient Position: Sitting, BP Cuff Size: Adult)  Pulse (P) 61  Resp (P) 16  Ht (P) 157.5 cm (5\' 2" )  Wt (P) 94.4 kg (208 lb 3.2 oz)  SpO2 (P) 96%  BMI (P) 38.08 kg/m2 Ht:(P) 157.5 cm (5\' 2" ) Wt:(P) 94.4 kg (208 lb 3.2 oz) [id number] surface area is 2.03 meters squared (pended). Body mass index is 38.08 kg/(m^2) (pended).  Wt Readings from Last 3 Encounters:  08/26/16 (P) 94.4 kg (208 lb 3.2 oz)  07/27/16 95.3 kg (210 lb)  07/26/16 95.3 kg (210 lb)   BP Readings from Last 3 Encounters:  08/26/16 (P) 112/60  07/27/16 153/68  07/26/16 120/64  general: Female in no acute distress  LUNGS Breath Sounds: Normal Percussion: Normal  CARDIOVASCULAR JVP CV wave: no HJR: no Elevation at 90 degrees: None Carotid Pulse: normal pulsation bilaterally Bruit: None Apex: apical impulse normal  Auscultation Rhythm: normal sinus rhythm S1: normal S2: normal Clicks: no Rub: no Murmurs: 3/6 medium pitched crescendo-decrescendo harsh at lower left sternal border, at upper left sternal border, at upper right sternal border  Gallop: None ABDOMEN Liver enlargement: no Pulsatile aorta: no Ascites: no Bruits: no  EXTREMITIES Clubbing: no Edema:  trace to 1+ bilateral pedal edema Pulses: peripheral pulses symmetrical Femoral Bruits: no Amputation: no SKIN Rash: no Cyanosis: no Embolic phemonenon: no Bruising: no NEURO Alert and Oriented to person, place and time: yes Non focal: yes LABS Last 3 CBC results: Lab Results  Component Value Date  WBC 4.6 06/14/2016  WBC 5.5 05/25/2016  WBC 5.2 03/17/2016   Lab Results  Component Value Date  HGB 9.4 (L) 06/14/2016  HGB 9.0 (L) 05/25/2016  HGB 9.3 (L) 03/17/2016   Lab Results  Component Value Date  HCT 29.8 (L) 06/14/2016  HCT 28.7 (L) 05/25/2016  HCT 29.5 (L) 03/17/2016   Lab Results  Component Value Date  PLT 304 06/14/2016  PLT 339 05/25/2016  PLT 278 03/17/2016   Lab Results  Component Value Date  CREATININE 0.9 07/09/2016  BUN 22 07/09/2016  NA 140 07/09/2016  K 4.3 07/09/2016  CL 103 07/09/2016  CO2 30.4 07/09/2016   Lab Results  Component Value Date  HGBA1C 5.9 (H) 07/09/2016   Lab Results  Component Value Date  ALT 16 06/14/2016  AST 17 06/14/2016  ALKPHOS 71 06/14/2016     Assessment and Plan   70 y.o. female with  ICD-10-CM ICD-9-CM  1. Coronary artery disease involving native coronary artery of native heart- Aortic valve is noncritical. However patient's symptoms are progressing. Functional study showed some ischemia in the inferior distribution. We will proceed with right and left heart cath to better evaluate for coronary anatomy and aortic valve to guide further therapy given her symptoms. Continue transfusions as needed is recommended. I25.10 414.01  2. Pure hypercholesterolemia-Will continue with low-fat diet. LDL goal of less than 100 is recommended. E78.0 272.0  3. Essential hypertension-blood pressure is controlled with metoprolol and losartan. Will continue with metoprolol 150 mg twice daily. DASH diet is also recommended I10 401.9  4. Type 2 diabetes mellitus with other circulatory complications-patient will remain on insulin with  an hemoglobin A1c goal of less than 6. Most recent value was reduced to 5.9. She and her husband are walking daily and eating a much better diet. Strict adherence to ADA diet is recommended E11.59 250.70  5. Palpitations-continue with metoprolol and avoiding stimulant. This appears to be controlled with metoprolol R00.2 785.1  6. Aortic stenosis, moderate-as per above. I35.0 424.1   No Follow-up on file.  These notes generated with voice recognition software. I apologize for typographical errors.  Sydnee Levans, MD    H and P reviewed. No changes

## 2016-09-03 NOTE — Telephone Encounter (Signed)
Asking to come in and "get a pint of blood". States her "lab is low and is messing with my heart"

## 2016-09-03 NOTE — OR Nursing (Signed)
Pt reports Dr Grayland Ormond stopped her Plavix and asa due to bleeding and anemia of unknown origin. Plavix stopped taking 7-8 months ago and aspirin stopped 2008.

## 2016-09-03 NOTE — OR Nursing (Signed)
Dr Ubaldo Glassing called, oked decreasing iv fluid due to history of chf, recheck cbc prior to cath due to no check post blood transfusion 9/1. Hold asa due to aspirin contradiction.

## 2016-09-06 ENCOUNTER — Other Ambulatory Visit: Payer: Self-pay | Admitting: Oncology

## 2016-09-06 ENCOUNTER — Other Ambulatory Visit: Payer: Self-pay

## 2016-09-06 ENCOUNTER — Emergency Department
Admission: EM | Admit: 2016-09-06 | Discharge: 2016-09-06 | Disposition: A | Discharge status: Home / Self Care | Payer: Medicare HMO | Attending: Emergency Medicine | Admitting: Emergency Medicine

## 2016-09-06 ENCOUNTER — Emergency Department: Payer: Medicare HMO

## 2016-09-06 ENCOUNTER — Encounter: Payer: Self-pay | Admitting: *Deleted

## 2016-09-06 DIAGNOSIS — D649 Anemia, unspecified: Secondary | ICD-10-CM

## 2016-09-06 DIAGNOSIS — Z87891 Personal history of nicotine dependence: Secondary | ICD-10-CM | POA: Diagnosis not present

## 2016-09-06 DIAGNOSIS — D509 Iron deficiency anemia, unspecified: Secondary | ICD-10-CM

## 2016-09-06 DIAGNOSIS — R0602 Shortness of breath: Secondary | ICD-10-CM | POA: Diagnosis present

## 2016-09-06 DIAGNOSIS — I10 Essential (primary) hypertension: Secondary | ICD-10-CM | POA: Insufficient documentation

## 2016-09-06 DIAGNOSIS — R079 Chest pain, unspecified: Secondary | ICD-10-CM

## 2016-09-06 DIAGNOSIS — Z79899 Other long term (current) drug therapy: Secondary | ICD-10-CM | POA: Diagnosis not present

## 2016-09-06 DIAGNOSIS — I2510 Atherosclerotic heart disease of native coronary artery without angina pectoris: Secondary | ICD-10-CM | POA: Insufficient documentation

## 2016-09-06 LAB — COMPREHENSIVE METABOLIC PANEL
ALBUMIN: 3.8 g/dL (ref 3.5–5.0)
ALK PHOS: 82 U/L (ref 38–126)
ALT: 18 U/L (ref 14–54)
AST: 23 U/L (ref 15–41)
Anion gap: 8 (ref 5–15)
BILIRUBIN TOTAL: 0.5 mg/dL (ref 0.3–1.2)
BUN: 25 mg/dL — AB (ref 6–20)
CO2: 25 mmol/L (ref 22–32)
Calcium: 9.1 mg/dL (ref 8.9–10.3)
Chloride: 104 mmol/L (ref 101–111)
Creatinine, Ser: 0.97 mg/dL (ref 0.44–1.00)
GFR calc Af Amer: >60 mL/min (ref >60)
GFR calc non Af Amer: 59 mL/min — ABNORMAL LOW (ref >60)
GLUCOSE: 172 mg/dL — AB (ref 65–99)
POTASSIUM: 4.7 mmol/L (ref 3.5–5.1)
SODIUM: 137 mmol/L (ref 135–145)
TOTAL PROTEIN: 7.2 g/dL (ref 6.5–8.1)

## 2016-09-06 LAB — CBC
HEMATOCRIT: 22.7 % — AB (ref 35.0–47.0)
Hemoglobin: 7.2 g/dL — ABNORMAL LOW (ref 12.0–16.0)
MCH: 24.6 pg — ABNORMAL LOW (ref 26.0–34.0)
MCHC: 31.9 g/dL — ABNORMAL LOW (ref 32.0–36.0)
MCV: 77.1 fL — AB (ref 80.0–100.0)
PLATELETS: 358 10*3/uL (ref 150–440)
RBC: 2.94 MIL/uL — AB (ref 3.80–5.20)
RDW: 18.1 % — ABNORMAL HIGH (ref 11.5–14.5)
WBC: 6.3 10*3/uL (ref 3.6–11.0)

## 2016-09-06 LAB — TROPONIN I: Troponin I: <0.03 ng/mL (ref <0.03)

## 2016-09-06 LAB — PREPARE RBC (CROSSMATCH)

## 2016-09-06 MED ORDER — SODIUM CHLORIDE 0.9 % IV SOLN
10.0000 mL/h | Freq: Once | INTRAVENOUS | Status: AC
  Administered 2016-09-06: 10 mL/h via INTRAVENOUS

## 2016-09-06 NOTE — ED Provider Notes (Signed)
-----------------------------------------   3:37 PM on 09/06/2016 -----------------------------------------   Blood pressure (!) 117/44, pulse 65, temperature 98 F (36.7 C), temperature source Oral, resp. rate 15, height 5\' 3"  (1.6 m), weight 210 lb (95.3 kg), SpO2 100 %.  Assuming care from Dr. Jimmye Norman of [facility] is a 70 y.o. female with a chief complaint of Chest Pain .    I was asked by Dr. Jimmye Norman to reassess patient after transfusion of 1U pRBC and if patient feeling improved patient will be discharged home.   I re-evaluated patient after the transfusion. Patient felt markedly improved and had no pain. She was monitored for 30 min post-infusion with stable VS and no signs or symptoms of an adverse reaction. Patient was discahrged with close f/u with PCP for re-evaluation.   Rudene Re, MD 09/07/16 1244

## 2016-09-06 NOTE — ED Provider Notes (Signed)
[facility] Emergency Department Provider Note        Time seen: ----------------------------------------- 1:26 PM on 09/06/2016 -----------------------------------------    I have reviewed the triage vital signs and the nursing notes.   HISTORY  Chief Complaint Chest Pain    HPI Joyce Potter is a 70 y.o. female who presents the ER for chest pain with exertion. Patient states she had a heart catheter performed on Friday which showed small vessel disease. She also had a blood transfusion last week, describes shortness of breath and had near syncopal event in the lobby here. Patient states she continues to see black stools. She's had multiple episodes of blood transfusions in the past.   Past Medical History:  Diagnosis Date  . Anemia    iron deficiency  . Aortic stenosis   . Basal cell carcinoma   . CAD (coronary artery disease)    s/p Left circumflex stent in 2012  . Carotid stenosis   . Cataract   . High cholesterol   . HTN (hypertension)   . Hyperlipidemia   . IDDM (insulin dependent diabetes mellitus) ([place])   . Multiple thyroid nodules    Benign  . Peptic ulcer disease   . Retinal artery occlusion    on left    Patient Active Problem List   Diagnosis Date Noted  . Iron deficiency anemia 07/13/2016  . Central retinal artery occlusion 10/01/2015  . Carotid stenosis 10/01/2015  . Stroke ([place]) 09/28/2015  . GI bleed from an occult source with recurrent chronic blood loss anemia 04/14/2013  . CAD (coronary artery disease) with demand ischemia from anemia 04/14/2013  . IDDM (insulin dependent diabetes mellitus) ([place]) 04/14/2013  . HTN (hypertension) 04/14/2013  . Chest pain 04/14/2013    Past Surgical History:  Procedure Laterality Date  . ARTERY BIOPSY Left 11/19/2015   Procedure: BIOPSY TEMPORAL ARTERY;  Surgeon: Algernon Huxley, MD;  Location: [facility] ORS;  Service: Vascular;  Laterality: Left;  . BREAST BIOPSY    . CARDIAC  CATHETERIZATION N/A 08/08/2015   Procedure: Right and Left Heart Cath and Coronary Angiography;  Surgeon: Teodoro Spray, MD;  Location: [place] CV LAB;  Service: Cardiovascular;  Laterality: N/A;  . CARDIAC CATHETERIZATION Bilateral 09/03/2016   Procedure: Right/Left Heart Cath and Coronary Angiography;  Surgeon: Teodoro Spray, MD;  Location: [place] CV LAB;  Service: Cardiovascular;  Laterality: Bilateral;  . CATARACT EXTRACTION W/ INTRAOCULAR LENS  IMPLANT, BILATERAL    . COLONOSCOPY     in 2013- internal hemorrhoids  . CORONARY ANGIOPLASTY    . CORONARY STENT PLACEMENT    . ENDARTERECTOMY Left 10/01/2015   Procedure: ENDARTERECTOMY CAROTID;  Surgeon: Algernon Huxley, MD;  Location: [facility] ORS;  Service: Vascular;  Laterality: Left;  . ESOPHAGOGASTRODUODENOSCOPY    . ESOPHAGOGASTRODUODENOSCOPY (EGD) WITH PROPOFOL N/A 05/31/2016   Procedure: ESOPHAGOGASTRODUODENOSCOPY (EGD) WITH PROPOFOL;  Surgeon: Manya Silvas, MD;  Location: [facility] ENDOSCOPY;  Service: Endoscopy;  Laterality: N/A;  . EYE SURGERY    . GIVENS CAPSULE STUDY N/A 04/17/2013   Procedure: GIVENS CAPSULE STUDY;  Surgeon: Arta Silence, MD;  Location: [facility] ENDOSCOPY;  Service: Endoscopy;  Laterality: N/A;  patient ate breakfast at 7am   . PARTIAL HYSTERECTOMY    . TRIGGER FINGER RELEASE      Allergies Aspirin; Invokamet [canagliflozin-metformin hcl]; Sulfa antibiotics; Lovastatin; Penicillins; and Vicodin [hydrocodone-acetaminophen]  Social History Social History  Substance Use Topics  . Smoking status: Former Research scientist (life sciences)  . Smokeless tobacco: Never  Used     Comment: quit about 31 years ago  . Alcohol use No    Review of Systems Constitutional: Negative for fever. Cardiovascular: Positive for chest pain Respiratory: Positive for shortness of breath Gastrointestinal: Negative for abdominal pain, vomiting and diarrhea. Positive for black stools Genitourinary: Negative for dysuria. Musculoskeletal: Negative for back  pain. Skin: Negative for rash. Neurological: Negative for headaches, positive for weakness  10-point ROS otherwise negative.  ____________________________________________   PHYSICAL EXAM:  VITAL SIGNS: ED Triage Vitals  Enc Vitals Group     BP 09/06/16 1256 (!) 143/92     Pulse Rate 09/06/16 1256 81     Resp 09/06/16 1256 18     Temp --      Temp src --      SpO2 --      Weight 09/06/16 1309 210 lb (95.3 kg)     Height 09/06/16 1309 5\' 3"  (1.6 m)     Head Circumference --      Peak Flow --      Pain Score 09/06/16 1307 7     Pain Loc --      Pain Edu? --      Excl. in [place]? --     Constitutional: Alert and oriented. Well appearing and in no distress. Eyes: Conjunctivae are Pale, PERRL. Normal extraocular movements. ENT   Head: Normocephalic and atraumatic.   Nose: No congestion/rhinnorhea.   Mouth/Throat: Mucous membranes are moist.   Neck: No stridor. Cardiovascular: Normal rate, regular rhythm. No murmurs, rubs, or gallops. Respiratory: Normal respiratory effort without tachypnea nor retractions. Breath sounds are clear and equal bilaterally. No wheezes/rales/rhonchi. Gastrointestinal: Soft and nontender. Normal bowel sounds Musculoskeletal: Nontender with normal range of motion in all extremities. No lower extremity tenderness nor edema. Neurologic:  Normal speech and language. No gross focal neurologic deficits are appreciated.  Skin:  Skin is warm, dry and intact. Pallor is noted Psychiatric: Mood and affect are normal. Speech and behavior are normal.  ____________________________________________  EKG: Interpreted by me. Sinus rhythm rate of 72 bpm, normal PR interval, normal QRS, normal QT interval. Nonspecific T-wave changes.  ____________________________________________  ED COURSE:  Pertinent labs & imaging results that were available during my care of the patient were reviewed by me and considered in my medical decision making (see chart for  details). Clinical Course  Patient is in no distress, likely anemic and symptomatic from same. I will check basic labs and she will likely require blood transfusion.  Procedures ____________________________________________   LABS (pertinent positives/negatives)  Labs Reviewed  CBC - Abnormal; Notable for the following:       Result Value   RBC 2.94 (*)    Hemoglobin 7.2 (*)    HCT 22.7 (*)    MCV 77.1 (*)    MCH 24.6 (*)    MCHC 31.9 (*)    RDW 18.1 (*)    All other components within normal limits  COMPREHENSIVE METABOLIC PANEL - Abnormal; Notable for the following:    Glucose, Bld 172 (*)    BUN 25 (*)    GFR calc non Af Amer 59 (*)    All other components within normal limits  TROPONIN I  TROPONIN I  TYPE AND SCREEN  PREPARE RBC (CROSSMATCH)    RADIOLOGY Images were viewed by me  Chest x-ray IMPRESSION: Mild cardiomegaly with central pulmonary vascular congestion likely reflects low-grade CHF. There is no pneumonia nor other acute cardiopulmonary abnormality.  ____________________________________________  FINAL ASSESSMENT AND  PLAN  Chest pain, dyspnea, anemia  Plan: Patient with labs and imaging as dictated above. Patient is in no acute distress, likely with symptomatic anemia. I have discussed the case with her cardiologist who recommends transfusion and outpatient follow-up. Labs here have been reassuring other than her anemia. She is stable for outpatient follow-up.   Earleen Newport, MD   Note: This dictation was prepared with Dragon dictation. Any transcriptional errors that result from this process are unintentional    Earleen Newport, MD 09/06/16 1428

## 2016-09-06 NOTE — ED Notes (Signed)
Pt reports she does not want infusion transfusing any higher than 19ml/hr due to burning. Dr. Jimmye Norman notified. Pt to be discharged after transfusion complete.

## 2016-09-06 NOTE — ED Triage Notes (Signed)
Pt states chest pain with excretion, states she had a heart cath preformed on Friday, states she also had a blood transfusion last week, states SOB, pt had near syncopal episode in lobby

## 2016-09-07 ENCOUNTER — Other Ambulatory Visit: Payer: Self-pay

## 2016-09-07 ENCOUNTER — Inpatient Hospital Stay: Payer: Medicare HMO

## 2016-09-07 ENCOUNTER — Ambulatory Visit: Payer: Self-pay

## 2016-09-07 ENCOUNTER — Inpatient Hospital Stay: Payer: Medicare HMO | Attending: Oncology

## 2016-09-07 DIAGNOSIS — D649 Anemia, unspecified: Secondary | ICD-10-CM

## 2016-09-07 DIAGNOSIS — Z79899 Other long term (current) drug therapy: Secondary | ICD-10-CM | POA: Diagnosis not present

## 2016-09-07 DIAGNOSIS — R079 Chest pain, unspecified: Secondary | ICD-10-CM | POA: Diagnosis not present

## 2016-09-07 DIAGNOSIS — K921 Melena: Secondary | ICD-10-CM | POA: Diagnosis not present

## 2016-09-07 DIAGNOSIS — D509 Iron deficiency anemia, unspecified: Secondary | ICD-10-CM

## 2016-09-07 LAB — PREPARE RBC (CROSSMATCH)

## 2016-09-07 LAB — CBC WITH DIFFERENTIAL/PLATELET
BASOS ABS: 0.1 10*3/uL (ref 0–0.1)
BASOS PCT: 1 %
EOS ABS: 0.3 10*3/uL (ref 0–0.7)
EOS PCT: 6 %
HCT: 24.7 % — ABNORMAL LOW (ref 35.0–47.0)
Hemoglobin: 8 g/dL — ABNORMAL LOW (ref 12.0–16.0)
LYMPHS PCT: 20 %
Lymphs Abs: 1 10*3/uL (ref 1.0–3.6)
MCH: 25.3 pg — ABNORMAL LOW (ref 26.0–34.0)
MCHC: 32.4 g/dL (ref 32.0–36.0)
MCV: 78 fL — ABNORMAL LOW (ref 80.0–100.0)
Monocytes Absolute: 0.5 10*3/uL (ref 0.2–0.9)
Monocytes Relative: 10 %
Neutro Abs: 3.1 10*3/uL (ref 1.4–6.5)
Neutrophils Relative %: 63 %
PLATELETS: 302 10*3/uL (ref 150–440)
RBC: 3.16 MIL/uL — AB (ref 3.80–5.20)
RDW: 18.7 % — ABNORMAL HIGH (ref 11.5–14.5)
WBC: 4.9 10*3/uL (ref 3.6–11.0)

## 2016-09-07 LAB — SAMPLE TO BLOOD BANK

## 2016-09-07 MED ORDER — SODIUM CHLORIDE 0.9 % IV SOLN
250.0000 mL | Freq: Once | INTRAVENOUS | Status: AC
  Administered 2016-09-07: 250 mL via INTRAVENOUS
  Filled 2016-09-07: qty 250

## 2016-09-07 MED ORDER — DIPHENHYDRAMINE HCL 50 MG/ML IJ SOLN
25.0000 mg | Freq: Once | INTRAMUSCULAR | Status: AC
  Administered 2016-09-07: 25 mg via INTRAVENOUS
  Filled 2016-09-07: qty 1

## 2016-09-07 MED ORDER — ACETAMINOPHEN 325 MG PO TABS
650.0000 mg | ORAL_TABLET | Freq: Once | ORAL | Status: AC
  Administered 2016-09-07: 650 mg via ORAL
  Filled 2016-09-07: qty 2

## 2016-09-09 ENCOUNTER — Ambulatory Visit: Payer: Self-pay

## 2016-09-09 ENCOUNTER — Ambulatory Visit: Payer: Self-pay | Admitting: Oncology

## 2016-09-09 ENCOUNTER — Other Ambulatory Visit: Payer: Self-pay

## 2016-09-09 LAB — TYPE AND SCREEN
ABO/RH(D): O POS
ABO/RH(D): O POS
Antibody Screen: NEGATIVE
Antibody Screen: NEGATIVE
UNIT DIVISION: 0
Unit division: 0
Unit division: 0

## 2016-10-01 ENCOUNTER — Telehealth: Payer: Self-pay | Admitting: *Deleted

## 2016-10-01 ENCOUNTER — Other Ambulatory Visit: Payer: Self-pay | Admitting: *Deleted

## 2016-10-01 DIAGNOSIS — D649 Anemia, unspecified: Secondary | ICD-10-CM

## 2016-10-01 NOTE — Telephone Encounter (Signed)
Patient called to say that she thinks she needs blood work done. States she is feeling tired and week. She would like Dr. Gary Fleet nurse to call her with an appointment next week. I advised patient that if she continued to feel sick or get worse over the weekend she should come to the ER for evaluation.

## 2016-10-01 NOTE — Telephone Encounter (Signed)
Lab only visit to be scheduled for Monday 10/9, lab orders entered.

## 2016-10-04 ENCOUNTER — Other Ambulatory Visit: Payer: Self-pay

## 2016-10-04 ENCOUNTER — Inpatient Hospital Stay: Payer: Medicare HMO | Attending: Oncology

## 2016-10-04 DIAGNOSIS — R079 Chest pain, unspecified: Secondary | ICD-10-CM | POA: Insufficient documentation

## 2016-10-04 DIAGNOSIS — K921 Melena: Secondary | ICD-10-CM | POA: Insufficient documentation

## 2016-10-04 DIAGNOSIS — D500 Iron deficiency anemia secondary to blood loss (chronic): Secondary | ICD-10-CM | POA: Insufficient documentation

## 2016-10-04 DIAGNOSIS — E7800 Pure hypercholesterolemia, unspecified: Secondary | ICD-10-CM | POA: Diagnosis not present

## 2016-10-04 DIAGNOSIS — D649 Anemia, unspecified: Secondary | ICD-10-CM

## 2016-10-04 DIAGNOSIS — R0602 Shortness of breath: Secondary | ICD-10-CM | POA: Diagnosis not present

## 2016-10-04 DIAGNOSIS — Z87891 Personal history of nicotine dependence: Secondary | ICD-10-CM | POA: Diagnosis not present

## 2016-10-04 DIAGNOSIS — Z79899 Other long term (current) drug therapy: Secondary | ICD-10-CM | POA: Diagnosis not present

## 2016-10-04 DIAGNOSIS — I2510 Atherosclerotic heart disease of native coronary artery without angina pectoris: Secondary | ICD-10-CM | POA: Insufficient documentation

## 2016-10-04 DIAGNOSIS — Z794 Long term (current) use of insulin: Secondary | ICD-10-CM | POA: Insufficient documentation

## 2016-10-04 DIAGNOSIS — E119 Type 2 diabetes mellitus without complications: Secondary | ICD-10-CM | POA: Diagnosis not present

## 2016-10-04 DIAGNOSIS — I10 Essential (primary) hypertension: Secondary | ICD-10-CM | POA: Diagnosis not present

## 2016-10-04 LAB — CBC WITH DIFFERENTIAL/PLATELET
Basophils Absolute: 0.1 10*3/uL (ref 0–0.1)
Basophils Relative: 1 %
Eosinophils Absolute: 0.3 10*3/uL (ref 0–0.7)
Eosinophils Relative: 6 %
HEMATOCRIT: 24.8 % — AB (ref 35.0–47.0)
HEMOGLOBIN: 8 g/dL — AB (ref 12.0–16.0)
LYMPHS ABS: 1.1 10*3/uL (ref 1.0–3.6)
LYMPHS PCT: 27 %
MCH: 25.6 pg — AB (ref 26.0–34.0)
MCHC: 32.1 g/dL (ref 32.0–36.0)
MCV: 79.8 fL — AB (ref 80.0–100.0)
MONOS PCT: 10 %
Monocytes Absolute: 0.4 10*3/uL (ref 0.2–0.9)
NEUTROS ABS: 2.3 10*3/uL (ref 1.4–6.5)
NEUTROS PCT: 56 %
Platelets: 243 10*3/uL (ref 150–440)
RBC: 3.11 MIL/uL — AB (ref 3.80–5.20)
RDW: 18.6 % — ABNORMAL HIGH (ref 11.5–14.5)
WBC: 4.1 10*3/uL (ref 3.6–11.0)

## 2016-10-04 LAB — IRON AND TIBC
Iron: 14 ug/dL — ABNORMAL LOW (ref 28–170)
SATURATION RATIOS: 4 % — AB (ref 10.4–31.8)
TIBC: 328 ug/dL (ref 250–450)
UIBC: 314 ug/dL

## 2016-10-04 LAB — FERRITIN: FERRITIN: 10 ng/mL — AB (ref 11–307)

## 2016-10-05 ENCOUNTER — Inpatient Hospital Stay (HOSPITAL_BASED_OUTPATIENT_CLINIC_OR_DEPARTMENT_OTHER): Payer: Medicare HMO | Admitting: Oncology

## 2016-10-05 ENCOUNTER — Inpatient Hospital Stay: Payer: Medicare HMO

## 2016-10-05 VITALS — BP 178/78 | HR 74 | Temp 97.4°F | Resp 20 | Wt 209.2 lb

## 2016-10-05 DIAGNOSIS — D500 Iron deficiency anemia secondary to blood loss (chronic): Secondary | ICD-10-CM

## 2016-10-05 DIAGNOSIS — R079 Chest pain, unspecified: Secondary | ICD-10-CM | POA: Diagnosis not present

## 2016-10-05 DIAGNOSIS — Z79899 Other long term (current) drug therapy: Secondary | ICD-10-CM

## 2016-10-05 DIAGNOSIS — R0602 Shortness of breath: Secondary | ICD-10-CM | POA: Diagnosis not present

## 2016-10-05 DIAGNOSIS — I10 Essential (primary) hypertension: Secondary | ICD-10-CM

## 2016-10-05 DIAGNOSIS — K921 Melena: Secondary | ICD-10-CM

## 2016-10-05 DIAGNOSIS — D509 Iron deficiency anemia, unspecified: Secondary | ICD-10-CM

## 2016-10-05 MED ORDER — FERUMOXYTOL INJECTION 510 MG/17 ML
510.0000 mg | Freq: Once | INTRAVENOUS | Status: AC
  Administered 2016-10-05: 510 mg via INTRAVENOUS
  Filled 2016-10-05: qty 17

## 2016-10-05 MED ORDER — SODIUM CHLORIDE 0.9 % IV SOLN
Freq: Once | INTRAVENOUS | Status: AC
  Administered 2016-10-05: 15:00:00 via INTRAVENOUS
  Filled 2016-10-05: qty 1000

## 2016-10-05 NOTE — Progress Notes (Signed)
Patient here today for follow up regarding anemia. Patient reports weakness, fatigue, chest pain and shortness of breath.

## 2016-10-09 NOTE — Progress Notes (Signed)
[place]  Telephone:([phone]) [phone] Fax:(336) [phone]  ID: Joyce Potter OB: [birth year]  MR#: [id number]  [id number]  Patient Care Team: [place] Pink, MD as PCP - General (Family Medicine) Manya Silvas, MD (Gastroenterology)  CHIEF COMPLAINT: Iron deficiency anemia, secondary to chronic blood loss.  INTERVAL HISTORY: Patient returns to clinic today as an add-on with increasing weakness and fatigue. She also has occasional shortness of breath and chest pain. She otherwise feels well. She continues to have occasional dizziness, but no other neurologic complaints. She denies any recent fevers. She has a fair appetite, but denies weight loss. She has no cough or hemoptysis. She denies any nausea, vomiting, constipation, or diarrhea. She continues to have melena. She has no urinary complaints. Patient offers no further specific complaints today.   REVIEW OF SYSTEMS:   Review of Systems  Constitutional: Positive for malaise/fatigue. Negative for fever and weight loss.  Respiratory: Positive for shortness of breath. Negative for cough and hemoptysis.   Cardiovascular: Positive for chest pain.  Gastrointestinal: Positive for melena. Negative for abdominal pain and blood in stool.  Musculoskeletal: Negative.   Neurological: Positive for dizziness and weakness.  Psychiatric/Behavioral: The patient is nervous/anxious.     As per HPI. Otherwise, a complete review of systems is negative.  PAST MEDICAL HISTORY: Past Medical History:  Diagnosis Date  . Anemia    iron deficiency  . Aortic stenosis   . Basal cell carcinoma   . CAD (coronary artery disease)    s/p Left circumflex stent in 2012  . Carotid stenosis   . Cataract   . High cholesterol   . HTN (hypertension)   . Hyperlipidemia   . IDDM (insulin dependent diabetes mellitus) ([place])   . Multiple thyroid nodules    Benign  . Peptic ulcer disease   . Retinal artery occlusion    on left    PAST  SURGICAL HISTORY: Past Surgical History:  Procedure Laterality Date  . ARTERY BIOPSY Left 11/19/2015   Procedure: BIOPSY TEMPORAL ARTERY;  Surgeon: Algernon Huxley, MD;  Location: [facility] ORS;  Service: Vascular;  Laterality: Left;  . BREAST BIOPSY    . CARDIAC CATHETERIZATION N/A 08/08/2015   Procedure: Right and Left Heart Cath and Coronary Angiography;  Surgeon: Teodoro Spray, MD;  Location: [place] CV [facility];  Service: Cardiovascular;  Laterality: N/A;  . CARDIAC CATHETERIZATION Bilateral 09/03/2016   Procedure: Right/Left Heart Cath and Coronary Angiography;  Surgeon: Teodoro Spray, MD;  Location: [place] [facility] LAB;  Service: Cardiovascular;  Laterality: Bilateral;  . CATARACT EXTRACTION W/ INTRAOCULAR LENS  IMPLANT, BILATERAL    . COLONOSCOPY     in 2013- internal hemorrhoids  . CORONARY ANGIOPLASTY    . CORONARY STENT PLACEMENT    . ENDARTERECTOMY Left 10/01/2015   Procedure: ENDARTERECTOMY CAROTID;  Surgeon: Algernon Huxley, MD;  Location: [facility] ORS;  Service: Vascular;  Laterality: Left;  . ESOPHAGOGASTRODUODENOSCOPY    . ESOPHAGOGASTRODUODENOSCOPY (EGD) WITH PROPOFOL N/A 05/31/2016   Procedure: ESOPHAGOGASTRODUODENOSCOPY (EGD) WITH PROPOFOL;  Surgeon: Manya Silvas, MD;  Location: [facility] ENDOSCOPY;  Service: Endoscopy;  Laterality: N/A;  . EYE SURGERY    . GIVENS CAPSULE STUDY N/A 04/17/2013   Procedure: GIVENS CAPSULE STUDY;  Surgeon: Arta Silence, MD;  Location[facility];  Service: Endoscopy;  Laterality: N/A;  patient ate breakfast at 7am   . PARTIAL HYSTERECTOMY    . TRIGGER FINGER RELEASE      FAMILY HISTORY Family History  Problem Relation  Age of Onset  . Uterine cancer Mother   . Breast cancer Mother   . Seizures Father   . Stroke Father   . Diabetes Father   . COPD Father   . Colon cancer Neg Hx        ADVANCED DIRECTIVES:    HEALTH MAINTENANCE: Social History  Substance Use Topics  . Smoking status: Former Research scientist (life sciences)  . Smokeless tobacco: Never Used      Comment: quit about 31 years ago  . Alcohol use No     Colonoscopy:  PAP:  Bone density:  Lipid panel:  Allergies  Allergen Reactions  . Aspirin Other (See Comments)    ulcers  . Invokamet [Canagliflozin-Metformin Hcl] Other (See Comments)    Yeast infection  . Sulfa Antibiotics Other (See Comments)    "Got Drunk"  . Lovastatin Rash  . Penicillins Rash  . Vicodin [Hydrocodone-Acetaminophen] Rash    Current Outpatient Prescriptions  Medication Sig Dispense Refill  . albuterol (PROAIR HFA) 108 (90 Base) MCG/ACT inhaler Inhale 2 puffs into the lungs every 4 (four) hours as needed. For shortness of breath/wheezing.    Marland Kitchen ascorbic acid (VITAMIN C) 500 MG tablet Take 500 mg by mouth daily.    . Biotin (RA BIOTIN) 1000 MCG tablet Take 1,000 mcg by mouth daily.    . Black Pepper-Turmeric (TURMERIC COMPLEX/BLACK PEPPER PO) Take 1 capsule by mouth daily.    . Flaxseed, Linseed, (FLAXSEED OIL) 1000 MG CAPS Take 1,000 mg by mouth daily.    Marland Kitchen gabapentin (NEURONTIN) 300 MG capsule Take 1 capsule by mouth at bedtime.    Marland Kitchen GARLIC PO Take 1 capsule by mouth daily.    . insulin NPH-regular Human (NOVOLIN 70/30) (70-30) 100 UNIT/ML injection Inject 50-56 Units into the skin See admin instructions. Inject 56 units subcutaneous every morning and Inject 50 units subcutaneous every day in the evening.    Marland Kitchen losartan (COZAAR) 100 MG tablet Take 100 mg by mouth daily.    . metoprolol (LOPRESSOR) 100 MG tablet Take 100 mg by mouth 2 (two) times daily.    . Multiple Vitamins-Minerals (MULTIVITAMIN WITH MINERALS) tablet Take 1 tablet by mouth daily.    Marland Kitchen omeprazole (PRILOSEC) 40 MG capsule Take 40 mg by mouth daily.    . pravastatin (PRAVACHOL) 10 MG tablet Take 1 tablet (10 mg total) by mouth daily at 6 PM. 30 tablet 0  . traMADol (ULTRAM) 50 MG tablet Take 1 tablet (50 mg total) by mouth every 6 (six) hours as needed for moderate pain. 30 tablet 0   No current facility-administered medications for this  visit.     OBJECTIVE: Vitals:   10/05/16 1401  BP: (!) 178/78  Pulse: 74  Resp: 20  Temp: 97.4 F (36.3 C)     Body mass index is 37.06 kg/m.    ECOG FS:1 - Symptomatic but completely ambulatory  General: Well-developed, well-nourished, no acute distress. Eyes: Pink conjunctiva, anicteric sclera. Lungs: Clear to auscultation bilaterally. Heart: Regular rate and rhythm. No rubs, murmurs, or gallops. Abdomen: Soft, nontender, nondistended. No organomegaly noted, normoactive bowel sounds. Musculoskeletal: No edema, cyanosis, or clubbing. Neuro: Alert, answering all questions appropriately. Cranial nerves grossly intact. Skin: No rashes or petechiae noted. Psych: Normal affect.   LAB RESULTS:  Lab Results  Component Value Date   NA 137 09/06/2016   K 4.7 09/06/2016   CL 104 09/06/2016   CO2 25 09/06/2016   GLUCOSE 172 (H) 09/06/2016   BUN 25 (H) 09/06/2016  CREATININE 0.97 09/06/2016   CALCIUM 9.1 09/06/2016   PROT 7.2 09/06/2016   ALBUMIN 3.8 09/06/2016   AST 23 09/06/2016   ALT 18 09/06/2016   ALKPHOS 82 09/06/2016   BILITOT 0.5 09/06/2016   GFRNONAA 59 (L) 09/06/2016   GFRAA >60 09/06/2016    Lab Results  Component Value Date   WBC 4.1 10/04/2016   NEUTROABS 2.3 10/04/2016   HGB 8.0 (L) 10/04/2016   HCT 24.8 (L) 10/04/2016   MCV 79.8 (L) 10/04/2016   PLT 243 10/04/2016   Lab Results  Component Value Date   IRON 14 (L) 10/04/2016   TIBC 328 10/04/2016   IRONPCTSAT 4 (L) 10/04/2016    Lab Results  Component Value Date   FERRITIN 10 (L) 10/04/2016     STUDIES: No results found.  ASSESSMENT: Iron deficiency anemia, secondary to chronic blood loss.  PLAN:    1. Iron deficiency anemia, secondary to chronic blood loss: Patient's hemoglobin and iron stores have significantly declined and she is symptomatic with shortness of breath and chest pain. Patient will receive IV Feraheme today, in 1 week, and in 2 weeks. She also may require periodic blood  transfusions, but not at this time. Will try to be more aggressive with her iron transfusions therefore patient will return to clinic in 2 months for further evaluation.  2. Melena: Patient has had multiple EGDs, colonoscopies, and capsule endoscopies with no obvious the etiology of her bleeding. Monitor and continue evaluation by GI. 3. Chest pain/shortness of breath: Continue follow-up and treatment per cardiology. 4. Hypertension: Patient's blood pressure is significantly elevated today, continue current medications as prescribed.    Patient expressed understanding and was in agreement with this plan. She also understands that She can call clinic at any time with any questions, concerns, or complaints.    Lloyd Huger, MD   10/09/2016 5:29 PM

## 2016-10-11 ENCOUNTER — Inpatient Hospital Stay: Payer: Medicare HMO | Admitting: Oncology

## 2016-10-11 ENCOUNTER — Inpatient Hospital Stay: Payer: Medicare HMO

## 2016-10-12 ENCOUNTER — Inpatient Hospital Stay: Payer: Medicare HMO

## 2016-10-12 VITALS — BP 134/65 | HR 75 | Temp 96.7°F | Resp 20

## 2016-10-12 DIAGNOSIS — D500 Iron deficiency anemia secondary to blood loss (chronic): Secondary | ICD-10-CM

## 2016-10-12 MED ORDER — SODIUM CHLORIDE 0.9 % IV SOLN
Freq: Once | INTRAVENOUS | Status: AC
  Administered 2016-10-12: 14:00:00 via INTRAVENOUS
  Filled 2016-10-12: qty 1000

## 2016-10-12 MED ORDER — SODIUM CHLORIDE 0.9 % IV SOLN
510.0000 mg | Freq: Once | INTRAVENOUS | Status: AC
  Administered 2016-10-12: 510 mg via INTRAVENOUS
  Filled 2016-10-12: qty 17

## 2016-10-19 ENCOUNTER — Inpatient Hospital Stay: Payer: Medicare HMO

## 2016-10-19 VITALS — BP 139/65 | HR 60 | Temp 97.0°F | Resp 20

## 2016-10-19 DIAGNOSIS — D500 Iron deficiency anemia secondary to blood loss (chronic): Secondary | ICD-10-CM

## 2016-10-19 MED ORDER — SODIUM CHLORIDE 0.9 % IV SOLN
510.0000 mg | Freq: Once | INTRAVENOUS | Status: AC
  Administered 2016-10-19: 510 mg via INTRAVENOUS
  Filled 2016-10-19: qty 17

## 2016-10-19 MED ORDER — SODIUM CHLORIDE 0.9 % IV SOLN
Freq: Once | INTRAVENOUS | Status: AC
  Administered 2016-10-19: 14:00:00 via INTRAVENOUS
  Filled 2016-10-19: qty 1000

## 2016-11-28 ENCOUNTER — Emergency Department
Admission: EM | Admit: 2016-11-28 | Discharge: 2016-11-28 | Disposition: A | Discharge status: Home / Self Care | Payer: Medicare HMO | Attending: Emergency Medicine | Admitting: Emergency Medicine

## 2016-11-28 ENCOUNTER — Emergency Department: Payer: Medicare HMO

## 2016-11-28 DIAGNOSIS — H538 Other visual disturbances: Secondary | ICD-10-CM | POA: Insufficient documentation

## 2016-11-28 DIAGNOSIS — I2510 Atherosclerotic heart disease of native coronary artery without angina pectoris: Secondary | ICD-10-CM | POA: Diagnosis not present

## 2016-11-28 DIAGNOSIS — Z794 Long term (current) use of insulin: Secondary | ICD-10-CM | POA: Diagnosis not present

## 2016-11-28 DIAGNOSIS — Z5181 Encounter for therapeutic drug level monitoring: Secondary | ICD-10-CM | POA: Insufficient documentation

## 2016-11-28 DIAGNOSIS — Z87891 Personal history of nicotine dependence: Secondary | ICD-10-CM | POA: Diagnosis not present

## 2016-11-28 DIAGNOSIS — I10 Essential (primary) hypertension: Secondary | ICD-10-CM | POA: Diagnosis not present

## 2016-11-28 DIAGNOSIS — Z79899 Other long term (current) drug therapy: Secondary | ICD-10-CM | POA: Diagnosis not present

## 2016-11-28 DIAGNOSIS — E119 Type 2 diabetes mellitus without complications: Secondary | ICD-10-CM | POA: Insufficient documentation

## 2016-11-28 LAB — COMPREHENSIVE METABOLIC PANEL
ALBUMIN: 3.8 g/dL (ref 3.5–5.0)
ALK PHOS: 73 U/L (ref 38–126)
ALT: 17 U/L (ref 14–54)
AST: 19 U/L (ref 15–41)
Anion gap: 6 (ref 5–15)
BILIRUBIN TOTAL: 0.4 mg/dL (ref 0.3–1.2)
BUN: 25 mg/dL — AB (ref 6–20)
CO2: 25 mmol/L (ref 22–32)
CREATININE: 0.78 mg/dL (ref 0.44–1.00)
Calcium: 9 mg/dL (ref 8.9–10.3)
Chloride: 108 mmol/L (ref 101–111)
GFR calc Af Amer: >60 mL/min (ref >60)
GLUCOSE: 152 mg/dL — AB (ref 65–99)
POTASSIUM: 4.4 mmol/L (ref 3.5–5.1)
Sodium: 139 mmol/L (ref 135–145)
TOTAL PROTEIN: 6.8 g/dL (ref 6.5–8.1)

## 2016-11-28 LAB — DIFFERENTIAL
Basophils Absolute: 0.1 K/uL (ref 0–0.1)
Basophils Relative: 1 %
Eosinophils Absolute: 0.3 K/uL (ref 0–0.7)
Eosinophils Relative: 7 %
Lymphocytes Relative: 29 %
Lymphs Abs: 1.4 K/uL (ref 1.0–3.6)
Monocytes Absolute: 0.5 K/uL (ref 0.2–0.9)
Monocytes Relative: 11 %
Neutro Abs: 2.6 K/uL (ref 1.4–6.5)
Neutrophils Relative %: 52 %

## 2016-11-28 LAB — CBC
HEMATOCRIT: 27.5 % — AB (ref 35.0–47.0)
HEMOGLOBIN: 9.1 g/dL — AB (ref 12.0–16.0)
MCH: 29.3 pg (ref 26.0–34.0)
MCHC: 33.2 g/dL (ref 32.0–36.0)
MCV: 88.2 fL (ref 80.0–100.0)
Platelets: 274 10*3/uL (ref 150–440)
RBC: 3.12 MIL/uL — AB (ref 3.80–5.20)
RDW: 17.1 % — ABNORMAL HIGH (ref 11.5–14.5)
WBC: 4.9 10*3/uL (ref 3.6–11.0)

## 2016-11-28 LAB — PROTIME-INR
INR: 1.02
Prothrombin Time: 13.4 s (ref 11.4–15.2)

## 2016-11-28 LAB — TROPONIN I: Troponin I: <0.03 ng/mL

## 2016-11-28 LAB — APTT: aPTT: 32 s (ref 24–36)

## 2016-11-28 LAB — SEDIMENTATION RATE: Sed Rate: 49 mm/hr — ABNORMAL HIGH (ref 0–30)

## 2016-11-28 MED ORDER — FLUORESCEIN SODIUM 1 MG OP STRP
ORAL_STRIP | OPHTHALMIC | Status: AC
  Filled 2016-11-28: qty 2

## 2016-11-28 MED ORDER — TETRACAINE HCL 0.5 % OP SOLN
OPHTHALMIC | Status: AC
  Filled 2016-11-28: qty 2

## 2016-11-28 NOTE — ED Notes (Signed)
Dr. Alfred Levins in room to assess patient at this time.

## 2016-11-28 NOTE — ED Provider Notes (Signed)
[facility] Emergency Department Provider Note  ____________________________________________  Time seen: Approximately 4:57 PM  I have reviewed the triage vital signs and the nursing notes.   HISTORY  Chief Complaint Blurred Vision   HPI Joyce Potter is a 70 y.o. female with a history of carotid stenosis and left-sided retinal artery occlusion who presents for evaluation of right visual changes. Patient reports 2-3 days of sensation of sand inside her right eye and initially having difficulty focusing her vision. Now since 6AM patient has had foggy vision. Patient reports that these symptoms are identical to when she had L CVAO. She also reports a moderate throbbing HA located behind her R eye, constant for the last few days. NO N/v, neck stiffness, fever, jaw claudication. Patient is not on Plavix or ASA due to h/o GI bleed.    Past Medical History:  Diagnosis Date  . Anemia    iron deficiency  . Aortic stenosis   . Basal cell carcinoma   . CAD (coronary artery disease)    s/p Left circumflex stent in 2012  . Carotid stenosis   . Cataract   . High cholesterol   . HTN (hypertension)   . Hyperlipidemia   . IDDM (insulin dependent diabetes mellitus) ([place])   . Multiple thyroid nodules    Benign  . Peptic ulcer disease   . Retinal artery occlusion    on left    Patient Active Problem List   Diagnosis Date Noted  . Iron deficiency anemia due to chronic blood loss 07/13/2016  . Central retinal artery occlusion 10/01/2015  . Carotid stenosis 10/01/2015  . Stroke ([place]) 09/28/2015  . GI bleed from an occult source with recurrent chronic blood loss anemia 04/14/2013  . CAD (coronary artery disease) with demand ischemia from anemia 04/14/2013  . IDDM (insulin dependent diabetes mellitus) ([place]) 04/14/2013  . HTN (hypertension) 04/14/2013  . Chest pain 04/14/2013    Past Surgical History:  Procedure Laterality Date  . ARTERY BIOPSY Left  11/19/2015   Procedure: BIOPSY TEMPORAL ARTERY;  Surgeon: Algernon Huxley, MD;  Location: [facility] ORS;  Service: Vascular;  Laterality: Left;  . BREAST BIOPSY    . CARDIAC CATHETERIZATION N/A 08/08/2015   Procedure: Right and Left Heart Cath and Coronary Angiography;  Surgeon: Teodoro Spray, MD;  Location: [place] CV LAB;  Service: Cardiovascular;  Laterality: N/A;  . CARDIAC CATHETERIZATION Bilateral 09/03/2016   Procedure: Right/Left Heart Cath and Coronary Angiography;  Surgeon: Teodoro Spray, MD;  Location: [place] CV LAB;  Service: Cardiovascular;  Laterality: Bilateral;  . CATARACT EXTRACTION W/ INTRAOCULAR LENS  IMPLANT, BILATERAL    . COLONOSCOPY     in 2013- internal hemorrhoids  . CORONARY ANGIOPLASTY    . CORONARY STENT PLACEMENT    . ENDARTERECTOMY Left 10/01/2015   Procedure: ENDARTERECTOMY CAROTID;  Surgeon: Algernon Huxley, MD;  Location: [facility] ORS;  Service: Vascular;  Laterality: Left;  . ESOPHAGOGASTRODUODENOSCOPY    . ESOPHAGOGASTRODUODENOSCOPY (EGD) WITH PROPOFOL N/A 05/31/2016   Procedure: ESOPHAGOGASTRODUODENOSCOPY (EGD) WITH PROPOFOL;  Surgeon: Manya Silvas, MD;  Location: [facility] ENDOSCOPY;  Service: Endoscopy;  Laterality: N/A;  . EYE SURGERY    . GIVENS CAPSULE STUDY N/A 04/17/2013   Procedure: GIVENS CAPSULE STUDY;  Surgeon: Arta Silence, MD;  Location[facility] ENDOSCOPY;  Service: Endoscopy;  Laterality: N/A;  patient ate breakfast at 7am   . PARTIAL HYSTERECTOMY    . TRIGGER FINGER RELEASE      Prior to  Admission medications   Medication Sig Start Date End Date Taking? Authorizing Provider  albuterol (PROAIR HFA) 108 (90 Base) MCG/ACT inhaler Inhale 2 puffs into the lungs every 4 (four) hours as needed. For shortness of breath/wheezing. 02/16/16   Historical Provider, MD  ascorbic acid (VITAMIN C) 500 MG tablet Take 500 mg by mouth daily.    Historical Provider, MD  Biotin (RA BIOTIN) 1000 MCG tablet Take 1,000 mcg by mouth daily.    Historical Provider, MD  Black  Pepper-Turmeric (TURMERIC COMPLEX/BLACK PEPPER PO) Take 1 capsule by mouth daily.    Historical Provider, MD  Flaxseed, Linseed, (FLAXSEED OIL) 1000 MG CAPS Take 1,000 mg by mouth daily.    Historical Provider, MD  gabapentin (NEURONTIN) 300 MG capsule Take 1 capsule by mouth at bedtime. 07/01/16 07/01/17  Historical Provider, MD  GARLIC PO Take 1 capsule by mouth daily.    Historical Provider, MD  insulin NPH-regular Human (NOVOLIN 70/30) (70-30) 100 UNIT/ML injection Inject 50-56 Units into the skin See admin instructions. Inject 56 units subcutaneous every morning and Inject 50 units subcutaneous every day in the evening.    Historical Provider, MD  losartan (COZAAR) 100 MG tablet Take 100 mg by mouth daily.    Historical Provider, MD  metoprolol (LOPRESSOR) 100 MG tablet Take 100 mg by mouth 2 (two) times daily.    Historical Provider, MD  Multiple Vitamins-Minerals (MULTIVITAMIN WITH MINERALS) tablet Take 1 tablet by mouth daily.    Historical Provider, MD  omeprazole (PRILOSEC) 40 MG capsule Take 40 mg by mouth daily.    Historical Provider, MD  pravastatin (PRAVACHOL) 10 MG tablet Take 1 tablet (10 mg total) by mouth daily at 6 PM. 09/29/15   Bettey Costa, MD  traMADol (ULTRAM) 50 MG tablet Take 1 tablet (50 mg total) by mouth every 6 (six) hours as needed for moderate pain. 10/02/15   Algernon Huxley, MD    Allergies Aspirin; Invokamet [canagliflozin-metformin hcl]; Sulfa antibiotics; Lovastatin; Penicillins; and Vicodin [hydrocodone-acetaminophen]  Family History  Problem Relation Age of Onset  . Uterine cancer Mother   . Breast cancer Mother   . Seizures Father   . Stroke Father   . Diabetes Father   . COPD Father   . Colon cancer Neg Hx     Social History Social History  Substance Use Topics  . Smoking status: Former Research scientist (life sciences)  . Smokeless tobacco: Never Used     Comment: quit about 31 years ago  . Alcohol use No    Review of Systems  Constitutional: Negative for fever. Eyes: + R  sided foggy vision. ENT: Negative for sore throat. Neck: No neck pain  Cardiovascular: Negative for chest pain. Respiratory: Negative for shortness of breath. Gastrointestinal: Negative for abdominal pain, vomiting or diarrhea. Genitourinary: Negative for dysuria. Musculoskeletal: Negative for back pain. Skin: Negative for rash. Neurological: Negative for weakness or numbness. + HA Psych: No SI or HI  ____________________________________________   PHYSICAL EXAM:  VITAL SIGNS: ED Triage Vitals [11/28/16 1526]  Enc Vitals Group     BP (!) 159/66     Pulse Rate 72     Resp 18     Temp 98.8 F (37.1 C)     Temp Source Oral     SpO2 100 %     Weight 208 lb (94.3 kg)     Height 5\' 3"  (1.6 m)     Head Circumference      Peak Flow      Pain  Score 4     Pain Loc      Pain Edu?      Excl. in [place]?     Constitutional: Alert and oriented. Well appearing and in no apparent distress. HEENT:      Head: Normocephalic and atraumatic.         EYE EXAM: EOMI and not painful. PERRL bilaterally. Intact consensual light reflex. No photophobia. Conjunctivae is non erythematous or edematous. Sclerae in anicteric. Eye lid everted and no foreign objects or stye observed. Visual fields are intact on the R.  No stye or chalazion. Ocular pressure 28, 28, 28 on R eye and 38, 44, 44 on Left eye. No fluorescin uptake observed with Wood's lamp. No blepharitis. No erythema surrounding the eye      Mouth/Throat: Mucous membranes are moist.       Neck: Supple with no signs of meningismus. Cardiovascular: Regular rate and rhythm. No murmurs, gallops, or rubs. 2+ symmetrical distal pulses are present in all extremities. No JVD. Respiratory: Normal respiratory effort. Lungs are clear to auscultation bilaterally. No wheezes, crackles, or rhonchi.  Neurologic: Normal speech and language. Face is symmetric. Moving all extremities. No gross focal neurologic deficits are appreciated. Skin: Skin is warm, dry and  intact. No rash noted. Psychiatric: Mood and affect are normal. Speech and behavior are normal.  ____________________________________________   LABS (all labs ordered are listed, but only abnormal results are displayed)  Labs Reviewed  CBC - Abnormal; Notable for the following:       Result Value   RBC 3.12 (*)    Hemoglobin 9.1 (*)    HCT 27.5 (*)    RDW 17.1 (*)    All other components within normal limits  COMPREHENSIVE METABOLIC PANEL - Abnormal; Notable for the following:    Glucose, Bld 152 (*)    BUN 25 (*)    All other components within normal limits  SEDIMENTATION RATE - Abnormal; Notable for the following:    Sed Rate 49 (*)    All other components within normal limits  PROTIME-INR  APTT  DIFFERENTIAL  TROPONIN I  C-REACTIVE PROTEIN  CBG MONITORING, ED   ____________________________________________  EKG  ED ECG REPORT I, Rudene Re, the attending physician, personally viewed and interpreted this ECG.  Normal sinus rhythm, rate of 64, normal intervals, normal axis, no ST elevations or depressions. ____________________________________________  RADIOLOGY  Head CT: Negative  ____________________________________________   PROCEDURES  Procedure(s) performed: None Procedures Critical Care performed:  None ____________________________________________   INITIAL IMPRESSION / ASSESSMENT AND PLAN / ED COURSE  70 y.o. female with a history of carotid stenosis and left-sided retinal artery occlusion who presents for evaluation of right visual changes similar to prior L CVAO. Eye exam showing elevate IPO at 28 otherwise no acute findings. Head CT WNL. Will consult ophthalmology  Clinical Course    Spoke with Dr. Benay Pillow ophthalmologist on call about patient's symptoms, history, presentation, and exam. He recommended discharging the patient with close follow-up in his clinic tomorrow at 8 AM for further testing. He said that unfortunately he is unable  to perform any further testing at this time here in the emergency room. He also did not recommended any further imaging at this time. Patient is comfortable with the plan.  Pertinent labs & imaging results that were available during my care of the patient were reviewed by me and considered in my medical decision making (see chart for details).    ____________________________________________   FINAL  CLINICAL IMPRESSION(S) / ED DIAGNOSES  Final diagnoses:  Blurry vision, right eye      NEW MEDICATIONS STARTED DURING THIS VISIT:  New Prescriptions   No medications on file     Note:  This document was prepared using Dragon voice recognition software and may include unintentional dictation errors.    Rudene Re, MD 11/28/16 [phone]

## 2016-11-28 NOTE — ED Triage Notes (Signed)
Pt presents to ED with c/o blurry vision to R eye. Pt states hx of stroke behind L eye, pt no longer has vision. Pt states she woke up at approx 0600 with a HA and blurred vision to her R eye. Pt is alert and oriented. Grip strength equal bilaterally, facial symmetry intact, pt is alert and oriented NAD noted.

## 2016-11-28 NOTE — ED Notes (Signed)
Patient reports waking up to slight swelling bilaterally to her eyes, states, "It felt like something was in it.  Then at church, it started getting foggy."  Patient reports feeling like, "everything was really bright yesterday."  Patient is in no obvious distress at this time.

## 2016-11-29 ENCOUNTER — Other Ambulatory Visit: Payer: Self-pay | Admitting: Ophthalmology

## 2016-11-29 DIAGNOSIS — H34239 Retinal artery branch occlusion, unspecified eye: Secondary | ICD-10-CM

## 2016-11-29 LAB — C-REACTIVE PROTEIN: CRP: 0.9 mg/dL (ref <1.0)

## 2016-11-30 ENCOUNTER — Other Ambulatory Visit: Payer: Self-pay | Admitting: Obstetrics and Gynecology

## 2016-11-30 DIAGNOSIS — Z1231 Encounter for screening mammogram for malignant neoplasm of breast: Secondary | ICD-10-CM

## 2016-12-03 ENCOUNTER — Ambulatory Visit
Admission: RE | Admit: 2016-12-03 | Discharge: 2016-12-03 | Disposition: A | Discharge status: Home / Self Care | Payer: Medicare HMO | Source: Ambulatory Visit | Attending: Ophthalmology | Admitting: Ophthalmology

## 2016-12-03 DIAGNOSIS — I6523 Occlusion and stenosis of bilateral carotid arteries: Secondary | ICD-10-CM | POA: Diagnosis not present

## 2016-12-03 DIAGNOSIS — H34239 Retinal artery branch occlusion, unspecified eye: Secondary | ICD-10-CM | POA: Diagnosis not present

## 2016-12-05 NOTE — Progress Notes (Signed)
[place]  [phone]) [phone] Fax:(336) [phone]  ID: EARTHA SUMMERHILL OB: 24-Jul-1946  MR#: [id number]  [id number]  Patient Care Team: [place] Pink, MD as PCP - General (Family Medicine) Manya Silvas, MD (Gastroenterology)  CHIEF COMPLAINT: Iron deficiency anemia, secondary to chronic blood loss.  INTERVAL HISTORY: Patient returns to clinic today for follow-up and further evaluation of her iron deficient anemia. She feels well and unchanged from her last iron transfusion in October. She continues to have occasional dizziness and blurred vision that has worsened in her right eye. She is currently seeing an optomitrist and will follow-up with them in the next few weeks. She has no other neurologic complaints. She denies any recent fevers. She has a fair appetite, but denies weight loss. She has no cough or hemoptysis. She denies any nausea, vomiting, constipation, or diarrhea. She denies melena. She has no urinary complaints. Patient offers no further specific complaints today.   REVIEW OF SYSTEMS:   Review of Systems  Constitutional: Positive for malaise/fatigue. Negative for fever and weight loss.  Eyes: Positive for blurred vision.  Respiratory: Negative for cough, hemoptysis and shortness of breath.   Cardiovascular: Negative for chest pain.  Gastrointestinal: Negative for abdominal pain, blood in stool and melena.  Musculoskeletal: Negative.   Neurological: Positive for dizziness and weakness.  Psychiatric/Behavioral: The patient is nervous/anxious.     As per HPI. Otherwise, a complete review of systems is negative.  PAST MEDICAL HISTORY: Past Medical History:  Diagnosis Date  . Anemia    iron deficiency  . Aortic stenosis   . Basal cell carcinoma   . CAD (coronary artery disease)    s/p Left circumflex stent in 2012  . Carotid stenosis   . Cataract   . High cholesterol   . HTN (hypertension)   . Hyperlipidemia   . IDDM (insulin  dependent diabetes mellitus) ([place])   . Multiple thyroid nodules    Benign  . Peptic ulcer disease   . Retinal artery occlusion    on left    PAST SURGICAL HISTORY: Past Surgical History:  Procedure Laterality Date  . ARTERY BIOPSY Left 11/19/2015   Procedure: BIOPSY TEMPORAL ARTERY;  Surgeon: Algernon Huxley, MD;  Location: [facility] ORS;  Service: Vascular;  Laterality: Left;  . BREAST BIOPSY    . CARDIAC CATHETERIZATION N/A 08/08/2015   Procedure: Right and Left Heart Cath and Coronary Angiography;  Surgeon: Teodoro Spray, MD;  Location: [place] [facility];  Service: Cardiovascular;  Laterality: N/A;  . CARDIAC CATHETERIZATION Bilateral 09/03/2016   Procedure: Right/Left Heart Cath and Coronary Angiography;  Surgeon: Teodoro Spray, MD;  Location: [place] [facility];  Service: Cardiovascular;  Laterality: Bilateral;  . CATARACT EXTRACTION W/ INTRAOCULAR LENS  IMPLANT, BILATERAL    . COLONOSCOPY     in 2013- internal hemorrhoids  . CORONARY ANGIOPLASTY    . CORONARY STENT PLACEMENT    . ENDARTERECTOMY Left 10/01/2015   Procedure: ENDARTERECTOMY CAROTID;  Surgeon: Algernon Huxley, MD;  Location: [facility] ORS;  Service: Vascular;  Laterality: Left;  . ESOPHAGOGASTRODUODENOSCOPY    . ESOPHAGOGASTRODUODENOSCOPY (EGD) WITH PROPOFOL N/A 05/31/2016   Procedure: ESOPHAGOGASTRODUODENOSCOPY (EGD) WITH PROPOFOL;  Surgeon: Manya Silvas, MD;  Location: [facility] ENDOSCOPY;  Service: Endoscopy;  Laterality: N/A;  . EYE SURGERY    . GIVENS CAPSULE STUDY N/A 04/17/2013   Procedure: GIVENS CAPSULE STUDY;  Surgeon: Arta Silence, MD;  Location: [facility] ENDOSCOPY;  Service: Endoscopy;  Laterality: N/A;  patient ate  breakfast at 7am   . PARTIAL HYSTERECTOMY    . TRIGGER FINGER RELEASE      FAMILY HISTORY Family History  Problem Relation Age of Onset  . Uterine cancer Mother   . Breast cancer Mother   . Seizures Father   . Stroke Father   . Diabetes Father   . COPD Father   . Colon cancer Neg Hx        ADVANCED  DIRECTIVES:    HEALTH MAINTENANCE: Social History  Substance Use Topics  . Smoking status: Former Research scientist (life sciences)  . Smokeless tobacco: Never Used     Comment: quit about 31 years ago  . Alcohol use No     Colonoscopy:  PAP:  Bone density:  Lipid panel:  Allergies  Allergen Reactions  . Aspirin Other (See Comments)    ulcers  . Invokamet [Canagliflozin-Metformin Hcl] Other (See Comments)    Yeast infection  . Sulfa Antibiotics Other (See Comments)    "Got Drunk"  . Lovastatin Rash  . Penicillins Rash  . Vicodin [Hydrocodone-Acetaminophen] Rash    Current Outpatient Prescriptions  Medication Sig Dispense Refill  . albuterol (PROAIR HFA) 108 (90 Base) MCG/ACT inhaler Inhale 2 puffs into the lungs every 4 (four) hours as needed. For shortness of breath/wheezing.    Marland Kitchen ascorbic acid (VITAMIN C) 500 MG tablet Take 500 mg by mouth daily.    . Biotin (RA BIOTIN) 1000 MCG tablet Take 1,000 mcg by mouth daily.    . Black Pepper-Turmeric (TURMERIC COMPLEX/BLACK PEPPER PO) Take 1 capsule by mouth daily.    . Flaxseed, Linseed, (FLAXSEED OIL) 1000 MG CAPS Take 1,000 mg by mouth daily.    Marland Kitchen gabapentin (NEURONTIN) 300 MG capsule Take 1 capsule by mouth at bedtime.    Marland Kitchen GARLIC PO Take 1 capsule by mouth daily.    . insulin NPH-regular Human (NOVOLIN 70/30) (70-30) 100 UNIT/ML injection Inject 50-56 Units into the skin See admin instructions. Inject 56 units subcutaneous every morning and Inject 50 units subcutaneous every day in the evening.    Marland Kitchen losartan (COZAAR) 100 MG tablet Take 100 mg by mouth daily.    . metoprolol (LOPRESSOR) 100 MG tablet Take 100 mg by mouth 2 (two) times daily.    . Multiple Vitamins-Minerals (MULTIVITAMIN WITH MINERALS) tablet Take 1 tablet by mouth daily.    Marland Kitchen omeprazole (PRILOSEC) 40 MG capsule Take 40 mg by mouth daily.    . pravastatin (PRAVACHOL) 10 MG tablet Take 1 tablet (10 mg total) by mouth daily at 6 PM. 30 tablet 0  . traMADol (ULTRAM) 50 MG tablet Take 1  tablet (50 mg total) by mouth every 6 (six) hours as needed for moderate pain. 30 tablet 0   No current facility-administered medications for this visit.     OBJECTIVE: Vitals:   12/06/16 1449  BP: (!) 152/73  Pulse: 76  Resp: 18  Temp: 98.3 F (36.8 C)     Body mass index is 36.94 kg/m.    ECOG FS:1 - Symptomatic but completely ambulatory  General: Well-developed, well-nourished, no acute distress. Eyes: Pink conjunctiva, anicteric sclera. Lungs: Clear to auscultation bilaterally. Heart: Regular rate and rhythm. No rubs, murmurs, or gallops. Abdomen: Soft, nontender, nondistended. No organomegaly noted, normoactive bowel sounds. Musculoskeletal: No edema, cyanosis, or clubbing. Neuro: Alert, answering all questions appropriately. Cranial nerves grossly intact. Skin: No rashes or petechiae noted. Psych: Normal affect.   LAB RESULTS:  Lab Results  Component Value Date   NA 139 11/28/2016  K 4.4 11/28/2016   CL 108 11/28/2016   CO2 25 11/28/2016   GLUCOSE 152 (H) 11/28/2016   BUN 25 (H) 11/28/2016   CREATININE 0.78 11/28/2016   CALCIUM 9.0 11/28/2016   PROT 6.8 11/28/2016   ALBUMIN 3.8 11/28/2016   AST 19 11/28/2016   ALT 17 11/28/2016   ALKPHOS 73 11/28/2016   BILITOT 0.4 11/28/2016   GFRNONAA >60 11/28/2016   GFRAA >60 11/28/2016    Lab Results  Component Value Date   WBC 4.8 12/06/2016   NEUTROABS 3.0 12/06/2016   HGB 8.9 (L) 12/06/2016   HCT 27.0 (L) 12/06/2016   MCV 85.0 12/06/2016   PLT 270 12/06/2016   Lab Results  Component Value Date   IRON 22 (L) 12/06/2016   TIBC 297 12/06/2016   IRONPCTSAT 7 (L) 12/06/2016    Lab Results  Component Value Date   FERRITIN 22 12/06/2016     STUDIES: Ct Head Wo Contrast  Result Date: 11/28/2016 CLINICAL DATA:  Blurry vision under right eye.  Headache. EXAM: CT HEAD WITHOUT CONTRAST TECHNIQUE: Contiguous axial images were obtained from the base of the skull through the vertex without intravenous  contrast. COMPARISON:  September 29, 2015 FINDINGS: Brain: No subdural, epidural, or subarachnoid hemorrhage. Cerebellum, brainstem, and basal cisterns are within normal limits. Ventricles and sulci are normal for age. No acute cortical ischemia or infarct. Specifically, no occipital infarcts identified. No mass, mass effect, or midline shift. Vascular: Calcified atherosclerosis in the intracranial carotid arteries. Skull: Normal. Negative for fracture or focal lesion. Sinuses/Orbits: No acute finding. Other: None. IMPRESSION: No cause for the patient's symptoms identified. Electronically Signed   By: Dorise Bullion III M.D   On: 11/28/2016 16:15   US Carotid Bilateral  Result Date: 12/03/2016 CLINICAL DATA:  Retinal artery branch occlusion. EXAM: BILATERAL CAROTID DUPLEX ULTRASOUND TECHNIQUE: Pearline Cables scale imaging, color Doppler and duplex ultrasound were performed of bilateral carotid and vertebral arteries in the neck. COMPARISON:  CT 11/28/2016. FINDINGS: Criteria: Quantification of carotid stenosis is based on velocity parameters that correlate the residual internal carotid diameter with NASCET-based stenosis levels, using the diameter of the distal internal carotid lumen as the denominator for stenosis measurement. The following velocity measurements were obtained: RIGHT ICA:  82/24 cm/sec CCA:  123XX123 cm/sec SYSTOLIC ICA/CCA RATIO:  1.0 DIASTOLIC ICA/CCA RATIO:  2.0 ECA:  182 cm/sec LEFT ICA:  120/36 cm/sec CCA:  123456 cm/sec SYSTOLIC ICA/CCA RATIO:  1.3 DIASTOLIC ICA/CCA RATIO:  1.9 ECA:  110 cm/sec RIGHT CAROTID ARTERY: Mild right carotid bifurcation and proximal ICA atherosclerotic vascular disease. No flow limiting stenosis. RIGHT VERTEBRAL ARTERY:  Patent with antegrade flow. LEFT CAROTID ARTERY: Mild left carotid bifurcation and proximal ICA atherosclerotic vascular disease. No flow limiting stenosis. Prior left carotid endarterectomy. LEFT VERTEBRAL ARTERY:  Patent with antegrade flow. IMPRESSION: 1.  Mild bilateral carotid bifurcation and proximal ICA atherosclerotic vascular disease. No flow limiting stenosis. Prior left carotid endarterectomy. Degree of stenosis less than 50% bilaterally. 2. Vertebral arteries are patent with antegrade flow. Electronically Signed   By: Marcello Moores  Register   On: 12/03/2016 16:03    ASSESSMENT: Iron deficiency anemia, secondary to chronic blood loss.  PLAN:    1. Iron deficiency anemia, secondary to chronic blood loss: Patient's hemoglobin is stable at 8.9, despite receiving 3 infusions of Feraheme approximately 2 months ago. Patient's iron stores continue to be decreased. Patient will receive IV Feraheme today, in 1 week, and in 2 weeks as she did in October. Her hemoglobin  has maintained at this time and will not require a transfusion. Will try to be more aggressive with her iron transfusions therefore patient will return to clinic in 2 months for further evaluation.  2. Melena: Resolved. Patient has had multiple EGDs, colonoscopies, and capsule endoscopies with no obvious the etiology of her bleeding. Monitor and continue evaluation by GI. GI appointment is scheduled for early January.  3. Chest pain/shortness of breath: Resolved. Follow-up with cardiology.  4. Hypertension: Patient's blood pressure is still elevated today, continue current medications as prescribed.   5. Blurry vision of right eye: Patient following up with Optometrist in the next few weeks.   Patient expressed understanding and was in agreement with this plan. She also understands that She can call clinic at any time with any questions, concerns, or complaints.   Faythe Casa, NP 12/06/2016 03:20PM  Patient was seen and evaluated independently and I agree with the assessment and plan as dictated above. Continue to aggressively give IV iron to minimize transfusions.  Lloyd Huger, MD 12/08/16 9:13 AM

## 2016-12-06 ENCOUNTER — Inpatient Hospital Stay: Payer: Medicare HMO | Attending: Oncology | Admitting: Oncology

## 2016-12-06 ENCOUNTER — Inpatient Hospital Stay: Payer: Medicare HMO

## 2016-12-06 VITALS — BP 152/73 | HR 76 | Temp 98.3°F | Resp 18 | Wt 208.6 lb

## 2016-12-06 VITALS — BP 136/67 | HR 56

## 2016-12-06 DIAGNOSIS — I10 Essential (primary) hypertension: Secondary | ICD-10-CM | POA: Insufficient documentation

## 2016-12-06 DIAGNOSIS — D500 Iron deficiency anemia secondary to blood loss (chronic): Secondary | ICD-10-CM

## 2016-12-06 DIAGNOSIS — H538 Other visual disturbances: Secondary | ICD-10-CM | POA: Diagnosis not present

## 2016-12-06 DIAGNOSIS — I6529 Occlusion and stenosis of unspecified carotid artery: Secondary | ICD-10-CM | POA: Insufficient documentation

## 2016-12-06 DIAGNOSIS — E119 Type 2 diabetes mellitus without complications: Secondary | ICD-10-CM | POA: Insufficient documentation

## 2016-12-06 DIAGNOSIS — Z79899 Other long term (current) drug therapy: Secondary | ICD-10-CM | POA: Diagnosis not present

## 2016-12-06 DIAGNOSIS — E7800 Pure hypercholesterolemia, unspecified: Secondary | ICD-10-CM | POA: Diagnosis not present

## 2016-12-06 DIAGNOSIS — Z87891 Personal history of nicotine dependence: Secondary | ICD-10-CM | POA: Diagnosis not present

## 2016-12-06 DIAGNOSIS — I350 Nonrheumatic aortic (valve) stenosis: Secondary | ICD-10-CM | POA: Insufficient documentation

## 2016-12-06 DIAGNOSIS — Z794 Long term (current) use of insulin: Secondary | ICD-10-CM | POA: Insufficient documentation

## 2016-12-06 DIAGNOSIS — D509 Iron deficiency anemia, unspecified: Secondary | ICD-10-CM

## 2016-12-06 DIAGNOSIS — I2510 Atherosclerotic heart disease of native coronary artery without angina pectoris: Secondary | ICD-10-CM | POA: Insufficient documentation

## 2016-12-06 LAB — IRON AND TIBC
IRON: 22 ug/dL — AB (ref 28–170)
SATURATION RATIOS: 7 % — AB (ref 10.4–31.8)
TIBC: 297 ug/dL (ref 250–450)
UIBC: 275 ug/dL

## 2016-12-06 LAB — CBC WITH DIFFERENTIAL/PLATELET
BASOS ABS: 0 10*3/uL (ref 0–0.1)
BASOS PCT: 1 %
EOS PCT: 6 %
Eosinophils Absolute: 0.3 10*3/uL (ref 0–0.7)
HCT: 27 % — ABNORMAL LOW (ref 35.0–47.0)
Hemoglobin: 8.9 g/dL — ABNORMAL LOW (ref 12.0–16.0)
LYMPHS PCT: 22 %
Lymphs Abs: 1.1 10*3/uL (ref 1.0–3.6)
MCH: 28.2 pg (ref 26.0–34.0)
MCHC: 33.1 g/dL (ref 32.0–36.0)
MCV: 85 fL (ref 80.0–100.0)
MONO ABS: 0.5 10*3/uL (ref 0.2–0.9)
Monocytes Relative: 10 %
Neutro Abs: 3 10*3/uL (ref 1.4–6.5)
Neutrophils Relative %: 61 %
PLATELETS: 270 10*3/uL (ref 150–440)
RBC: 3.18 MIL/uL — AB (ref 3.80–5.20)
RDW: 16.3 % — AB (ref 11.5–14.5)
WBC: 4.8 10*3/uL (ref 3.6–11.0)

## 2016-12-06 LAB — FERRITIN: FERRITIN: 22 ng/mL (ref 11–307)

## 2016-12-06 MED ORDER — SODIUM CHLORIDE 0.9 % IV SOLN
510.0000 mg | Freq: Once | INTRAVENOUS | Status: AC
  Administered 2016-12-06: 510 mg via INTRAVENOUS
  Filled 2016-12-06: qty 17

## 2016-12-06 MED ORDER — SODIUM CHLORIDE 0.9 % IV SOLN
Freq: Once | INTRAVENOUS | Status: AC
  Administered 2016-12-06: 15:00:00 via INTRAVENOUS
  Filled 2016-12-06: qty 1000

## 2016-12-06 NOTE — Progress Notes (Signed)
Pt is anxious today due to waiting on results from recent carotid ultrasound. Has been having blurry vision in right eye.

## 2016-12-14 ENCOUNTER — Inpatient Hospital Stay: Payer: Medicare HMO

## 2016-12-14 VITALS — BP 145/74 | HR 89 | Temp 97.7°F | Resp 18

## 2016-12-14 DIAGNOSIS — D500 Iron deficiency anemia secondary to blood loss (chronic): Secondary | ICD-10-CM

## 2016-12-14 MED ORDER — SODIUM CHLORIDE 0.9 % IV SOLN
INTRAVENOUS | Status: DC
  Administered 2016-12-14: 14:00:00 via INTRAVENOUS
  Filled 2016-12-14: qty 1000

## 2016-12-14 MED ORDER — SODIUM CHLORIDE 0.9 % IV SOLN
510.0000 mg | Freq: Once | INTRAVENOUS | Status: AC
  Administered 2016-12-14: 510 mg via INTRAVENOUS
  Filled 2016-12-14: qty 17

## 2016-12-21 ENCOUNTER — Inpatient Hospital Stay: Payer: Medicare HMO

## 2016-12-21 VITALS — BP 116/70 | HR 78 | Resp 20

## 2016-12-21 DIAGNOSIS — D500 Iron deficiency anemia secondary to blood loss (chronic): Secondary | ICD-10-CM

## 2016-12-21 MED ORDER — SODIUM CHLORIDE 0.9 % IV SOLN
510.0000 mg | Freq: Once | INTRAVENOUS | Status: AC
  Administered 2016-12-21: 510 mg via INTRAVENOUS
  Filled 2016-12-21: qty 17

## 2016-12-21 MED ORDER — SODIUM CHLORIDE 0.9 % IV SOLN
INTRAVENOUS | Status: DC
  Administered 2016-12-21: 14:00:00 via INTRAVENOUS
  Filled 2016-12-21: qty 1000

## 2016-12-27 DIAGNOSIS — I639 Cerebral infarction, unspecified: Secondary | ICD-10-CM

## 2016-12-27 HISTORY — DX: Cerebral infarction, unspecified: I63.9

## 2017-01-10 ENCOUNTER — Ambulatory Visit
Admission: RE | Admit: 2017-01-10 | Discharge: 2017-01-10 | Disposition: A | Discharge status: Home / Self Care | Payer: Medicare Other | Source: Ambulatory Visit | Attending: Obstetrics and Gynecology | Admitting: Obstetrics and Gynecology

## 2017-01-10 DIAGNOSIS — Z1231 Encounter for screening mammogram for malignant neoplasm of breast: Secondary | ICD-10-CM | POA: Diagnosis not present

## 2017-01-27 ENCOUNTER — Telehealth: Payer: Self-pay | Admitting: *Deleted

## 2017-01-27 ENCOUNTER — Inpatient Hospital Stay: Payer: Medicare Other

## 2017-01-27 ENCOUNTER — Inpatient Hospital Stay: Payer: Medicare Other | Attending: Oncology

## 2017-01-27 ENCOUNTER — Other Ambulatory Visit: Payer: Self-pay

## 2017-01-27 VITALS — BP 150/65 | HR 78 | Temp 97.0°F | Resp 20

## 2017-01-27 DIAGNOSIS — D500 Iron deficiency anemia secondary to blood loss (chronic): Secondary | ICD-10-CM | POA: Diagnosis present

## 2017-01-27 DIAGNOSIS — I10 Essential (primary) hypertension: Secondary | ICD-10-CM | POA: Diagnosis not present

## 2017-01-27 DIAGNOSIS — Z87891 Personal history of nicotine dependence: Secondary | ICD-10-CM | POA: Diagnosis not present

## 2017-01-27 DIAGNOSIS — I2510 Atherosclerotic heart disease of native coronary artery without angina pectoris: Secondary | ICD-10-CM | POA: Diagnosis not present

## 2017-01-27 DIAGNOSIS — I350 Nonrheumatic aortic (valve) stenosis: Secondary | ICD-10-CM | POA: Diagnosis not present

## 2017-01-27 DIAGNOSIS — E7800 Pure hypercholesterolemia, unspecified: Secondary | ICD-10-CM | POA: Diagnosis not present

## 2017-01-27 DIAGNOSIS — E119 Type 2 diabetes mellitus without complications: Secondary | ICD-10-CM | POA: Insufficient documentation

## 2017-01-27 DIAGNOSIS — Z79899 Other long term (current) drug therapy: Secondary | ICD-10-CM | POA: Diagnosis not present

## 2017-01-27 DIAGNOSIS — I6529 Occlusion and stenosis of unspecified carotid artery: Secondary | ICD-10-CM | POA: Insufficient documentation

## 2017-01-27 DIAGNOSIS — Z794 Long term (current) use of insulin: Secondary | ICD-10-CM | POA: Insufficient documentation

## 2017-01-27 LAB — FERRITIN: Ferritin: 39 ng/mL (ref 11–307)

## 2017-01-27 LAB — CBC WITH DIFFERENTIAL/PLATELET
BASOS PCT: 1 %
Basophils Absolute: 0 10*3/uL (ref 0–0.1)
EOS ABS: 0.2 10*3/uL (ref 0–0.7)
Eosinophils Relative: 5 %
HEMATOCRIT: 22.9 % — AB (ref 35.0–47.0)
HEMOGLOBIN: 7.5 g/dL — AB (ref 12.0–16.0)
LYMPHS ABS: 0.8 10*3/uL — AB (ref 1.0–3.6)
Lymphocytes Relative: 18 %
MCH: 29.3 pg (ref 26.0–34.0)
MCHC: 32.9 g/dL (ref 32.0–36.0)
MCV: 89.2 fL (ref 80.0–100.0)
MONOS PCT: 9 %
Monocytes Absolute: 0.4 10*3/uL (ref 0.2–0.9)
NEUTROS ABS: 3 10*3/uL (ref 1.4–6.5)
NEUTROS PCT: 67 %
Platelets: 291 10*3/uL (ref 150–440)
RBC: 2.57 MIL/uL — AB (ref 3.80–5.20)
RDW: 16 % — ABNORMAL HIGH (ref 11.5–14.5)
WBC: 4.5 10*3/uL (ref 3.6–11.0)

## 2017-01-27 LAB — SAMPLE TO BLOOD BANK

## 2017-01-27 LAB — IRON AND TIBC
IRON: 18 ug/dL — AB (ref 28–170)
SATURATION RATIOS: 6 % — AB (ref 10.4–31.8)
TIBC: 280 ug/dL (ref 250–450)
UIBC: 262 ug/dL

## 2017-01-27 MED ORDER — FERUMOXYTOL INJECTION 510 MG/17 ML
510.0000 mg | Freq: Once | INTRAVENOUS | Status: AC
  Administered 2017-01-27: 510 mg via INTRAVENOUS
  Filled 2017-01-27: qty 17

## 2017-01-27 NOTE — Telephone Encounter (Signed)
Called to report that she feels her HGB has dropped again and is asking to come in for lab check and possible iron infusion. She states she has no energy and feels it is interfering with her heart. Her next appt is not until 2/12.  Please advise

## 2017-01-27 NOTE — Telephone Encounter (Signed)
Per VO Dr Grayland Ormond, check CBC, IIBC, FERR, Xtra hold tube. Patient will come in at 1145 today

## 2017-02-03 ENCOUNTER — Inpatient Hospital Stay: Payer: Medicare Other

## 2017-02-03 VITALS — BP 137/69 | HR 59 | Temp 97.2°F | Resp 18

## 2017-02-03 DIAGNOSIS — D500 Iron deficiency anemia secondary to blood loss (chronic): Secondary | ICD-10-CM | POA: Diagnosis not present

## 2017-02-03 MED ORDER — SODIUM CHLORIDE 0.9 % IV SOLN
INTRAVENOUS | Status: DC
  Administered 2017-02-03: 14:00:00 via INTRAVENOUS
  Filled 2017-02-03: qty 1000

## 2017-02-03 MED ORDER — SODIUM CHLORIDE 0.9 % IV SOLN
510.0000 mg | Freq: Once | INTRAVENOUS | Status: AC
  Administered 2017-02-03: 510 mg via INTRAVENOUS
  Filled 2017-02-03: qty 17

## 2017-02-07 ENCOUNTER — Ambulatory Visit: Payer: Self-pay

## 2017-02-07 ENCOUNTER — Inpatient Hospital Stay: Payer: Medicare Other

## 2017-02-07 ENCOUNTER — Other Ambulatory Visit: Payer: Self-pay | Admitting: *Deleted

## 2017-02-07 ENCOUNTER — Ambulatory Visit: Payer: Self-pay | Admitting: Oncology

## 2017-02-07 DIAGNOSIS — D500 Iron deficiency anemia secondary to blood loss (chronic): Secondary | ICD-10-CM | POA: Diagnosis not present

## 2017-02-07 LAB — CBC WITH DIFFERENTIAL/PLATELET
BASOS PCT: 1 %
Basophils Absolute: 0.1 10*3/uL (ref 0–0.1)
EOS ABS: 0.2 10*3/uL (ref 0–0.7)
Eosinophils Relative: 4 %
HCT: 27.1 % — ABNORMAL LOW (ref 35.0–47.0)
HEMOGLOBIN: 8.9 g/dL — AB (ref 12.0–16.0)
LYMPHS ABS: 0.8 10*3/uL — AB (ref 1.0–3.6)
Lymphocytes Relative: 18 %
MCH: 30 pg (ref 26.0–34.0)
MCHC: 32.9 g/dL (ref 32.0–36.0)
MCV: 91.3 fL (ref 80.0–100.0)
Monocytes Absolute: 0.4 10*3/uL (ref 0.2–0.9)
Monocytes Relative: 10 %
NEUTROS PCT: 67 %
Neutro Abs: 3.1 10*3/uL (ref 1.4–6.5)
Platelets: 295 10*3/uL (ref 150–440)
RBC: 2.96 MIL/uL — AB (ref 3.80–5.20)
RDW: 18.9 % — ABNORMAL HIGH (ref 11.5–14.5)
WBC: 4.5 10*3/uL (ref 3.6–11.0)

## 2017-02-07 LAB — IRON AND TIBC
IRON: 71 ug/dL (ref 28–170)
Saturation Ratios: 27 % (ref 10.4–31.8)
TIBC: 266 ug/dL (ref 250–450)
UIBC: 195 ug/dL

## 2017-02-07 LAB — FERRITIN: FERRITIN: 374 ng/mL — AB (ref 11–307)

## 2017-02-09 NOTE — Progress Notes (Signed)
[place]  Telephone:(336) [phone] Fax:(336) [phone]  ID: KARCYN MENN OB: 1946/09/01  MR#: [id number]  WUG#:[id number]  Patient Care Team: [place] Pink, MD as PCP - General (Family Medicine) Manya Silvas, MD (Gastroenterology)  CHIEF COMPLAINT: Iron deficiency anemia, secondary to chronic blood loss.  INTERVAL HISTORY: Patient returns to clinic today for follow-up and further evaluation of her iron deficient anemia. She continues to feel persistently weak and fatigued. Although admits it is slightly improved since receiving 2 infusions of Feraheme over the past week. She has no neurologic complaints. She denies any recent fevers. She has a fair appetite, but denies weight loss. She has no cough or hemoptysis. She denies any nausea, vomiting, constipation, or diarrhea. She denies melena. She has no urinary complaints. Patient offers no further specific complaints today.   REVIEW OF SYSTEMS:   Review of Systems  Constitutional: Positive for malaise/fatigue. Negative for fever and weight loss.  Eyes: Negative.  Negative for blurred vision.  Respiratory: Negative for cough, hemoptysis and shortness of breath.   Cardiovascular: Negative for chest pain and leg swelling.  Gastrointestinal: Negative for abdominal pain, blood in stool and melena.  Genitourinary: Negative for hematuria.  Musculoskeletal: Negative.   Neurological: Positive for weakness. Negative for dizziness.  Psychiatric/Behavioral: The patient is nervous/anxious.     As per HPI. Otherwise, a complete review of systems is negative.  PAST MEDICAL HISTORY: Past Medical History:  Diagnosis Date  . Anemia    iron deficiency  . Aortic stenosis   . Basal cell carcinoma   . CAD (coronary artery disease)    s/p Left circumflex stent in 2012  . Carotid stenosis   . Cataract   . High cholesterol   . HTN (hypertension)   . Hyperlipidemia   . IDDM (insulin dependent diabetes mellitus) ([place])     . Multiple thyroid nodules    Benign  . Peptic ulcer disease   . Retinal artery occlusion    on left    PAST SURGICAL HISTORY: Past Surgical History:  Procedure Laterality Date  . ARTERY BIOPSY Left 11/19/2015   Procedure: BIOPSY TEMPORAL ARTERY;  Surgeon: Algernon Huxley, MD;  Location: [facility] ORS;  Service: Vascular;  Laterality: Left;  . BREAST BIOPSY Left    core- neg  . CARDIAC CATHETERIZATION N/A 08/08/2015   Procedure: Right and Left Heart Cath and Coronary Angiography;  Surgeon: Teodoro Spray, MD;  Location: [place] [facility];  Service: Cardiovascular;  Laterality: N/A;  . CARDIAC CATHETERIZATION Bilateral 09/03/2016   Procedure: Right/Left Heart Cath and Coronary Angiography;  Surgeon: Teodoro Spray, MD;  Location: [place] [facility];  Service: Cardiovascular;  Laterality: Bilateral;  . CATARACT EXTRACTION W/ INTRAOCULAR LENS  IMPLANT, BILATERAL    . COLONOSCOPY     in 2013- internal hemorrhoids  . CORONARY ANGIOPLASTY    . CORONARY STENT PLACEMENT    . ENDARTERECTOMY Left 10/01/2015   Procedure: ENDARTERECTOMY CAROTID;  Surgeon: Algernon Huxley, MD;  Location: [facility] ORS;  Service: Vascular;  Laterality: Left;  . ESOPHAGOGASTRODUODENOSCOPY    . ESOPHAGOGASTRODUODENOSCOPY (EGD) WITH PROPOFOL N/A 05/31/2016   Procedure: ESOPHAGOGASTRODUODENOSCOPY (EGD) WITH PROPOFOL;  Surgeon: Manya Silvas, MD;  Location: [facility] ENDOSCOPY;  Service: Endoscopy;  Laterality: N/A;  . EYE SURGERY    . GIVENS CAPSULE STUDY N/A 04/17/2013   Procedure: GIVENS CAPSULE STUDY;  Surgeon: Arta Silence, MD;  Location: [facility] ENDOSCOPY;  Service: Endoscopy;  Laterality: N/A;  patient ate breakfast at 7am   .  PARTIAL HYSTERECTOMY    . TRIGGER FINGER RELEASE      FAMILY HISTORY Family History  Problem Relation Age of Onset  . Uterine cancer Mother   . Breast cancer Mother 31  . Seizures Father   . Stroke Father   . Diabetes Father   . COPD Father   . Colon cancer Neg Hx        ADVANCED DIRECTIVES:     HEALTH MAINTENANCE: Social History  Substance Use Topics  . Smoking status: Former Research scientist (life sciences)  . Smokeless tobacco: Never Used     Comment: quit about 31 years ago  . Alcohol use No     Colonoscopy:  PAP:  Bone density:  Lipid panel:  Allergies  Allergen Reactions  . Aspirin Other (See Comments)    ulcers  . Invokamet [Canagliflozin-Metformin Hcl] Other (See Comments)    Yeast infection  . Sulfa Antibiotics Other (See Comments)    "Got Drunk"  . Lovastatin Rash  . Penicillins Rash  . Vicodin [Hydrocodone-Acetaminophen] Rash    Current Outpatient Prescriptions  Medication Sig Dispense Refill  . albuterol (PROAIR HFA) 108 (90 Base) MCG/ACT inhaler Inhale 2 puffs into the lungs every 4 (four) hours as needed. For shortness of breath/wheezing.    Marland Kitchen ascorbic acid (VITAMIN C) 500 MG tablet Take 500 mg by mouth daily.    . Biotin (RA BIOTIN) 1000 MCG tablet Take 1,000 mcg by mouth daily.    . Black Pepper-Turmeric (TURMERIC COMPLEX/BLACK PEPPER PO) Take 1 capsule by mouth daily.    . Flaxseed, Linseed, (FLAXSEED OIL) 1000 MG CAPS Take 1,000 mg by mouth daily.    Marland Kitchen gabapentin (NEURONTIN) 300 MG capsule Take 1 capsule by mouth at bedtime.    Marland Kitchen GARLIC PO Take 1 capsule by mouth daily.    . insulin NPH-regular Human (NOVOLIN 70/30) (70-30) 100 UNIT/ML injection Inject 50-56 Units into the skin See admin instructions. Inject 56 units subcutaneous every morning and Inject 50 units subcutaneous every day in the evening.    Marland Kitchen losartan (COZAAR) 100 MG tablet Take 100 mg by mouth daily.    . metoprolol (LOPRESSOR) 100 MG tablet Take 100 mg by mouth 2 (two) times daily.    . Multiple Vitamins-Minerals (MULTIVITAMIN WITH MINERALS) tablet Take 1 tablet by mouth daily.    Marland Kitchen omeprazole (PRILOSEC) 40 MG capsule Take 40 mg by mouth daily.    . pravastatin (PRAVACHOL) 10 MG tablet Take 1 tablet (10 mg total) by mouth daily at 6 PM. 30 tablet 0  . traMADol (ULTRAM) 50 MG tablet Take 1 tablet (50 mg  total) by mouth every 6 (six) hours as needed for moderate pain. 30 tablet 0  . sertraline (ZOLOFT) 100 MG tablet Take 100 mg by mouth at bedtime as needed.     No current facility-administered medications for this visit.     OBJECTIVE: Vitals:   02/10/17 1137  BP: (!) 166/78  Pulse: 86  Temp: 97.4 F (36.3 C)     Body mass index is 35.73 kg/m.    ECOG FS:1 - Symptomatic but completely ambulatory  General: Well-developed, well-nourished, no acute distress. Eyes: Pink conjunctiva, anicteric sclera. Lungs: Clear to auscultation bilaterally. Heart: Regular rate and rhythm. No rubs, murmurs, or gallops. Abdomen: Soft, nontender, nondistended. No organomegaly noted, normoactive bowel sounds. Musculoskeletal: No edema, cyanosis, or clubbing. Neuro: Alert, answering all questions appropriately. Cranial nerves grossly intact. Skin: No rashes or petechiae noted. Psych: Normal affect.   LAB RESULTS:  Lab Results  Component Value Date   NA 139 11/28/2016   K 4.4 11/28/2016   CL 108 11/28/2016   CO2 25 11/28/2016   GLUCOSE 152 (H) 11/28/2016   BUN 25 (H) 11/28/2016   CREATININE 0.78 11/28/2016   CALCIUM 9.0 11/28/2016   PROT 6.8 11/28/2016   ALBUMIN 3.8 11/28/2016   AST 19 11/28/2016   ALT 17 11/28/2016   ALKPHOS 73 11/28/2016   BILITOT 0.4 11/28/2016   GFRNONAA >60 11/28/2016   GFRAA >60 11/28/2016    Lab Results  Component Value Date   WBC 4.5 02/07/2017   NEUTROABS 3.1 02/07/2017   HGB 8.9 (L) 02/07/2017   HCT 27.1 (L) 02/07/2017   MCV 91.3 02/07/2017   PLT 295 02/07/2017   Lab Results  Component Value Date   IRON 71 02/07/2017   TIBC 266 02/07/2017   IRONPCTSAT 27 02/07/2017    Lab Results  Component Value Date   FERRITIN 374 (H) 02/07/2017     STUDIES: No results found.  ASSESSMENT: Iron deficiency anemia, secondary to chronic blood loss.  PLAN:    1. Iron deficiency anemia, secondary to chronic blood loss: Patient's hemoglobin has improved with  2 recent doses of IV Feraheme, but still decreased. She also remains asymptomatic. Patient will receive IV Feraheme today. Patient has also agreed to a bone marrow biopsy in the near future to determine if there is any other etiology of her persistent anemia.  Will try to be more aggressive with her iron transfusions therefore patient will return to clinic in 1 month for further evaluation.  2. Melena: Resolved. Patient has had multiple EGDs, colonoscopies, and capsule endoscopies with no obvious the etiology of her bleeding. Monitor and continue evaluation by GI.  3. Chest pain/shortness of breath: Resolved. Follow-up with cardiology.  4. Hypertension: Patient's blood pressure is elevated today, continue current medications as prescribed.    Patient expressed understanding and was in agreement with this plan. She also understands that She can call clinic at any time with any questions, concerns, or complaints.    Lloyd Huger, MD 02/12/17 12:13 PM

## 2017-02-10 ENCOUNTER — Inpatient Hospital Stay: Payer: Medicare Other

## 2017-02-10 ENCOUNTER — Other Ambulatory Visit (INDEPENDENT_AMBULATORY_CARE_PROVIDER_SITE_OTHER): Payer: Self-pay | Admitting: Vascular Surgery

## 2017-02-10 ENCOUNTER — Inpatient Hospital Stay (HOSPITAL_BASED_OUTPATIENT_CLINIC_OR_DEPARTMENT_OTHER): Payer: Medicare Other | Admitting: Oncology

## 2017-02-10 VITALS — BP 166/78 | HR 86 | Temp 97.4°F | Ht 63.0 in | Wt 201.7 lb

## 2017-02-10 DIAGNOSIS — D500 Iron deficiency anemia secondary to blood loss (chronic): Secondary | ICD-10-CM

## 2017-02-10 DIAGNOSIS — I779 Disorder of arteries and arterioles, unspecified: Secondary | ICD-10-CM

## 2017-02-10 DIAGNOSIS — I739 Peripheral vascular disease, unspecified: Secondary | ICD-10-CM

## 2017-02-10 MED ORDER — FERUMOXYTOL INJECTION 510 MG/17 ML
510.0000 mg | Freq: Once | INTRAVENOUS | Status: AC
  Administered 2017-02-10: 510 mg via INTRAVENOUS
  Filled 2017-02-10: qty 17

## 2017-02-10 MED ORDER — SODIUM CHLORIDE 0.9 % IV SOLN
Freq: Once | INTRAVENOUS | Status: AC
  Administered 2017-02-10: 13:00:00 via INTRAVENOUS
  Filled 2017-02-10: qty 1000

## 2017-02-10 NOTE — Progress Notes (Signed)
Patient here for pre treatment check. She is feeling extremely tired, short of breath when she walks.

## 2017-02-11 ENCOUNTER — Encounter (INDEPENDENT_AMBULATORY_CARE_PROVIDER_SITE_OTHER): Payer: Self-pay | Admitting: Vascular Surgery

## 2017-02-11 ENCOUNTER — Other Ambulatory Visit: Payer: Self-pay | Admitting: Oncology

## 2017-02-11 ENCOUNTER — Ambulatory Visit (INDEPENDENT_AMBULATORY_CARE_PROVIDER_SITE_OTHER): Payer: Medicare Other

## 2017-02-11 ENCOUNTER — Ambulatory Visit (INDEPENDENT_AMBULATORY_CARE_PROVIDER_SITE_OTHER): Payer: Medicare Other | Admitting: Vascular Surgery

## 2017-02-11 VITALS — BP 144/63 | HR 58 | Resp 16 | Ht 63.0 in | Wt 200.0 lb

## 2017-02-11 DIAGNOSIS — I739 Peripheral vascular disease, unspecified: Secondary | ICD-10-CM | POA: Diagnosis not present

## 2017-02-11 DIAGNOSIS — I10 Essential (primary) hypertension: Secondary | ICD-10-CM | POA: Diagnosis not present

## 2017-02-11 DIAGNOSIS — D649 Anemia, unspecified: Secondary | ICD-10-CM

## 2017-02-11 DIAGNOSIS — I779 Disorder of arteries and arterioles, unspecified: Secondary | ICD-10-CM

## 2017-02-11 DIAGNOSIS — I6523 Occlusion and stenosis of bilateral carotid arteries: Secondary | ICD-10-CM | POA: Diagnosis not present

## 2017-02-11 LAB — VAS US CAROTID
LCCADSYS: -95 cm/s
LCCAPDIAS: 19 cm/s
LCCAPSYS: 97 cm/s
LEFT ECA DIAS: -16 cm/s
LEFT VERTEBRAL DIAS: 13 cm/s
LICADSYS: -107 cm/s
Left CCA dist dias: -18 cm/s
Left ICA dist dias: -28 cm/s
Left ICA prox dias: 28 cm/s
Left ICA prox sys: 104 cm/s
RCCADSYS: -88 cm/s
RCCAPDIAS: 14 cm/s
RCCAPSYS: 109 cm/s
RIGHT CCA MID DIAS: 17 cm/s
RIGHT ECA DIAS: -12 cm/s
RIGHT VERTEBRAL DIAS: 9 cm/s

## 2017-02-11 NOTE — Assessment & Plan Note (Signed)
Carotid duplex today reveals a widely patent left carotid endarterectomy without evidence of restenosis and stable 1-39% right internal carotid artery stenosis. Continue aspirin and statin agent. Recheck in 1 year.

## 2017-02-11 NOTE — Progress Notes (Signed)
MRN : [id number]  Joyce Potter is a 71 y.o. (Aug 05, 1946) female who presents with chief complaint of  Chief Complaint  Patient presents with  . Carotid  .  History of Present Illness: Patient returns in follow-up of multiple vascular issues. She is doing well without specific complaints today. She is not having any ischemic rest pain, lifestyle limiting claudication, or ulcerations of her lower extremities. Her noninvasive studies today continue to show good flow distally with normal ABIs and waveforms. She denies any focal neurologic symptoms. Specifically, the patient denies amaurosis fugax, speech or swallowing difficulties, or arm or leg weakness or numbness. Carotid duplex today reveals a widely patent left carotid endarterectomy without evidence of restenosis and stable 1-39% right internal carotid artery stenosis.  Current Outpatient Prescriptions  Medication Sig Dispense Refill  . albuterol (PROAIR HFA) 108 (90 Base) MCG/ACT inhaler Inhale 2 puffs into the lungs every 4 (four) hours as needed. For shortness of breath/wheezing.    Marland Kitchen ascorbic acid (VITAMIN C) 500 MG tablet Take 500 mg by mouth daily.    . Biotin (RA BIOTIN) 1000 MCG tablet Take 1,000 mcg by mouth daily.    . Black Pepper-Turmeric (TURMERIC COMPLEX/BLACK PEPPER PO) Take 1 capsule by mouth daily.    . Flaxseed, Linseed, (FLAXSEED OIL) 1000 MG CAPS Take 1,000 mg by mouth daily.    Marland Kitchen gabapentin (NEURONTIN) 300 MG capsule Take 1 capsule by mouth at bedtime.    Marland Kitchen GARLIC PO Take 1 capsule by mouth daily.    . insulin NPH-regular Human (NOVOLIN 70/30) (70-30) 100 UNIT/ML injection Inject 50-56 Units into the skin See admin instructions. Inject 56 units subcutaneous every morning and Inject 50 units subcutaneous every day in the evening.    Marland Kitchen losartan (COZAAR) 100 MG tablet Take 100 mg by mouth daily.    . metoprolol (LOPRESSOR) 100 MG tablet Take 100 mg by mouth 2 (two) times daily.    . Multiple Vitamins-Minerals  (MULTIVITAMIN WITH MINERALS) tablet Take 1 tablet by mouth daily.    Marland Kitchen omeprazole (PRILOSEC) 40 MG capsule Take 40 mg by mouth daily.    . pravastatin (PRAVACHOL) 10 MG tablet Take 1 tablet (10 mg total) by mouth daily at 6 PM. 30 tablet 0  . sertraline (ZOLOFT) 100 MG tablet Take 100 mg by mouth at bedtime as needed.    . traMADol (ULTRAM) 50 MG tablet Take 1 tablet (50 mg total) by mouth every 6 (six) hours as needed for moderate pain. 30 tablet 0   No current facility-administered medications for this visit.     Past Medical History:  Diagnosis Date  . Anemia    iron deficiency  . Aortic stenosis   . Basal cell carcinoma   . CAD (coronary artery disease)    s/p Left circumflex stent in 2012  . Carotid stenosis   . Cataract   . High cholesterol   . HTN (hypertension)   . Hyperlipidemia   . IDDM (insulin dependent diabetes mellitus) ([place])   . Multiple thyroid nodules    Benign  . Peptic ulcer disease   . Retinal artery occlusion    on left    Past Surgical History:  Procedure Laterality Date  . ARTERY BIOPSY Left 11/19/2015   Procedure: BIOPSY TEMPORAL ARTERY;  Surgeon: Algernon Huxley, MD;  Location: [facility] ORS;  Service: Vascular;  Laterality: Left;  . BREAST BIOPSY Left    core- neg  . CARDIAC CATHETERIZATION N/A 08/08/2015   Procedure: Right  and Left Heart Cath and Coronary Angiography;  Surgeon: Teodoro Spray, MD;  Location: [place] [facility];  Service: Cardiovascular;  Laterality: N/A;  . CARDIAC CATHETERIZATION Bilateral 09/03/2016   Procedure: Right/Left Heart Cath and Coronary Angiography;  Surgeon: Teodoro Spray, MD;  Location: [place] [facility];  Service: Cardiovascular;  Laterality: Bilateral;  . CATARACT EXTRACTION W/ INTRAOCULAR LENS  IMPLANT, BILATERAL    . COLONOSCOPY     in 2013- internal hemorrhoids  . CORONARY ANGIOPLASTY    . CORONARY STENT PLACEMENT    . ENDARTERECTOMY Left 10/01/2015   Procedure: ENDARTERECTOMY CAROTID;  Surgeon: Algernon Huxley, MD;   Location: [facility] ORS;  Service: Vascular;  Laterality: Left;  . ESOPHAGOGASTRODUODENOSCOPY    . ESOPHAGOGASTRODUODENOSCOPY (EGD) WITH PROPOFOL N/A 05/31/2016   Procedure: ESOPHAGOGASTRODUODENOSCOPY (EGD) WITH PROPOFOL;  Surgeon: Manya Silvas, MD;  Location: [facility] ENDOSCOPY;  Service: Endoscopy;  Laterality: N/A;  . EYE SURGERY    . GIVENS CAPSULE STUDY N/A 04/17/2013   Procedure: GIVENS CAPSULE STUDY;  Surgeon: Arta Silence, MD;  Location: [facility] ENDOSCOPY;  Service: Endoscopy;  Laterality: N/A;  patient ate breakfast at 7am   . PARTIAL HYSTERECTOMY    . TRIGGER FINGER RELEASE      Social History Social History  Substance Use Topics  . Smoking status: Former Research scientist (life sciences)  . Smokeless tobacco: Never Used     Comment: quit about 31 years ago  . Alcohol use No     Family History Family History  Problem Relation Age of Onset  . Uterine cancer Mother   . Breast cancer Mother 61  . Seizures Father   . Stroke Father   . Diabetes Father   . COPD Father   . Colon cancer Neg Hx     Allergies  Allergen Reactions  . Aspirin Other (See Comments)    ulcers  . Invokamet [Canagliflozin-Metformin Hcl] Other (See Comments)    Yeast infection  . Sulfa Antibiotics Other (See Comments)    "Got Drunk"  . Lovastatin Rash  . Penicillins Rash  . Vicodin [Hydrocodone-Acetaminophen] Rash     REVIEW OF SYSTEMS (Negative unless checked)  Constitutional: [] Weight loss  [] Fever  [] Chills Cardiac: [] Chest pain   [] Chest pressure   [] Palpitations   [] Shortness of breath when laying flat   [] Shortness of breath at rest   [] Shortness of breath with exertion. Vascular:  [] Pain in legs with walking   [] Pain in legs at rest   [] Pain in legs when laying flat   [] Claudication   [] Pain in feet when walking  [] Pain in feet at rest  [] Pain in feet when laying flat   [] History of DVT   [] Phlebitis   [] Swelling in legs   [] Varicose veins   [] Non-healing ulcers Pulmonary:   [] Uses home oxygen   [] Productive cough    [] Hemoptysis   [] Wheeze  [] COPD   [] Asthma Neurologic:  [] Dizziness  [] Blackouts   [] Seizures   [] History of stroke   [] History of TIA  [] Aphasia   [] Temporary blindness   [] Dysphagia   [] Weakness or numbness in arms   [] Weakness or numbness in legs Musculoskeletal:  [x] Arthritis   [] Joint swelling   [] Joint pain   [] Low back pain Hematologic:  [] Easy bruising  [] Easy bleeding   [] Hypercoagulable state   [] Anemic  [] Hepatitis Gastrointestinal:  [] Blood in stool   [] Vomiting blood  [] Gastroesophageal reflux/heartburn   [] Difficulty swallowing. Genitourinary:  [] Chronic kidney disease   [] Difficult urination  [] Frequent urination  [] Burning with urination   []   Blood in urine Skin:  [] Rashes   [] Ulcers   [] Wounds Psychological:  [] History of anxiety   []  History of major depression.  Physical Examination  Vitals:   02/11/17 1035  BP: (!) 144/63  Pulse: (!) 58  Resp: 16  Weight: 200 lb (90.7 kg)  Height: 5\' 3"  (1.6 m)   Body mass index is 35.43 kg/m. Gen:  WD/WN, NAD Head: [place]/AT, No temporalis wasting. Ear/Nose/Throat: Hearing grossly intact, nares w/o erythema or drainage, trachea midline Eyes: Conjunctiva clear. Sclera non-icteric Neck: Supple.  No JVD. Soft left-sided bruit present Pulmonary:  Good air movement, equal and clear to auscultation bilaterally.  Cardiac: RRR, systolic murmur is present. Vascular:  Vessel Right Left  Radial Palpable Palpable  Ulnar Palpable Palpable  Brachial Palpable Palpable  Carotid Palpable, without bruit Palpable, with bruit  Aorta Not palpable N/A  Femoral Palpable Palpable  Popliteal Palpable Palpable  PT Palpable Palpable  DP Palpable Palpable   Gastrointestinal: soft, non-tender/non-distended. No guarding/reflex.  Musculoskeletal: M/S 5/5 throughout.  No deformity or atrophy. Neurologic: CN 2-12 intact. Sensation grossly intact in extremities.  Symmetrical.  Speech is fluent. Motor exam as listed above. Psychiatric: Judgment intact, Mood &  affect appropriate for pt's clinical situation. Dermatologic: No rashes or ulcers noted.  No cellulitis or open wounds. Lymph : No Cervical, Axillary, or Inguinal lymphadenopathy.     CBC Lab Results  Component Value Date   WBC 4.5 02/07/2017   HGB 8.9 (L) 02/07/2017   HCT 27.1 (L) 02/07/2017   MCV 91.3 02/07/2017   PLT 295 02/07/2017    BMET    Component Value Date/Time   NA 139 11/28/2016 1534   NA 143 05/04/2013 0614   K 4.4 11/28/2016 1534   K 3.8 05/04/2013 0614   CL 108 11/28/2016 1534   CL 110 (H) 05/04/2013 0614   CO2 25 11/28/2016 1534   CO2 27 05/04/2013 0614   GLUCOSE 152 (H) 11/28/2016 1534   GLUCOSE 130 (H) 05/04/2013 0614   BUN 25 (H) 11/28/2016 1534   BUN 12 05/04/2013 0614   CREATININE 0.78 11/28/2016 1534   CREATININE 0.79 08/27/2014 1445   CALCIUM 9.0 11/28/2016 1534   CALCIUM 8.2 (L) 05/04/2013 0614   GFRNONAA >60 11/28/2016 1534   GFRNONAA >60 08/27/2014 1445   GFRAA >60 11/28/2016 1534   GFRAA >60 08/27/2014 1445   CrCl cannot be calculated (Patient's most recent lab result is older than the maximum 21 days allowed.).  COAG Lab Results  Component Value Date   INR 1.02 11/28/2016   INR 1.11 11/19/2015   INR 1.0 05/03/2013    Radiology No results found.   Assessment/Plan HTN (hypertension) blood pressure control important in reducing the progression of atherosclerotic disease. On appropriate oral medications.   PVD (peripheral vascular disease) ([place]) The patient has a history of peripheral vascular disease with calcification but maintained flow distally. Her noninvasive studies today continue to show good flow distally with normal ABIs and waveforms. We can recheck her ABIs in 1-2 years with noninvasive studies. Continue aspirin and statin agent.  Carotid stenosis Carotid duplex today reveals a widely patent left carotid endarterectomy without evidence of restenosis and stable 1-39% right internal carotid artery stenosis. Continue  aspirin and statin agent. Recheck in 1 year.    Leotis Pain, MD  02/11/2017 11:49 AM    This note was created with Dragon medical transcription system.  Any errors from dictation are purely unintentional

## 2017-02-11 NOTE — Assessment & Plan Note (Signed)
The patient has a history of peripheral vascular disease with calcification but maintained flow distally. Her noninvasive studies today continue to show good flow distally with normal ABIs and waveforms. We can recheck her ABIs in 1-2 years with noninvasive studies. Continue aspirin and statin agent.

## 2017-02-11 NOTE — Assessment & Plan Note (Signed)
blood pressure control important in reducing the progression of atherosclerotic disease. On appropriate oral medications.  

## 2017-02-14 ENCOUNTER — Other Ambulatory Visit: Payer: Self-pay | Admitting: Physician Assistant

## 2017-02-15 ENCOUNTER — Ambulatory Visit
Admission: RE | Admit: 2017-02-15 | Discharge: 2017-02-15 | Disposition: A | Discharge status: Home / Self Care | Payer: Medicare Other | Source: Ambulatory Visit | Attending: Oncology | Admitting: Oncology

## 2017-02-15 ENCOUNTER — Other Ambulatory Visit (HOSPITAL_COMMUNITY)
Admission: RE | Admit: 2017-02-15 | Disposition: A | Discharge status: Home / Self Care | Payer: Medicare Other | Source: Ambulatory Visit | Attending: Oncology | Admitting: Oncology

## 2017-02-15 DIAGNOSIS — D704 Cyclic neutropenia: Secondary | ICD-10-CM | POA: Diagnosis not present

## 2017-02-15 DIAGNOSIS — D649 Anemia, unspecified: Secondary | ICD-10-CM | POA: Insufficient documentation

## 2017-02-15 LAB — DIFFERENTIAL
Basophils Absolute: 0 10*3/uL (ref 0–0.1)
Basophils Relative: 1 %
EOS PCT: 7 %
Eosinophils Absolute: 0.3 10*3/uL (ref 0–0.7)
LYMPHS ABS: 0.8 10*3/uL — AB (ref 1.0–3.6)
LYMPHS PCT: 20 %
MONO ABS: 0.4 10*3/uL (ref 0.2–0.9)
MONOS PCT: 9 %
NEUTROS ABS: 2.6 10*3/uL (ref 1.4–6.5)
Neutrophils Relative %: 63 %

## 2017-02-15 LAB — CBC
HEMATOCRIT: 32 % — AB (ref 35.0–47.0)
HEMOGLOBIN: 10.7 g/dL — AB (ref 12.0–16.0)
MCH: 30.9 pg (ref 26.0–34.0)
MCHC: 33.6 g/dL (ref 32.0–36.0)
MCV: 92.1 fL (ref 80.0–100.0)
Platelets: 269 10*3/uL (ref 150–440)
RBC: 3.47 MIL/uL — ABNORMAL LOW (ref 3.80–5.20)
RDW: 18.2 % — AB (ref 11.5–14.5)
WBC: 4.2 10*3/uL (ref 3.6–11.0)

## 2017-02-15 LAB — APTT: APTT: 33 s (ref 24–36)

## 2017-02-15 LAB — PROTIME-INR
INR: 0.98
Prothrombin Time: 13 seconds (ref 11.4–15.2)

## 2017-02-15 LAB — BONE MARROW EXAM

## 2017-02-15 MED ORDER — MIDAZOLAM HCL 2 MG/2ML IJ SOLN
INTRAMUSCULAR | Status: AC | PRN
  Administered 2017-02-15 (×2): 1 mg via INTRAVENOUS

## 2017-02-15 MED ORDER — SODIUM CHLORIDE 0.9 % IV SOLN
INTRAVENOUS | Status: DC
  Administered 2017-02-15: 09:00:00 via INTRAVENOUS

## 2017-02-15 MED ORDER — FENTANYL CITRATE (PF) 100 MCG/2ML IJ SOLN
INTRAMUSCULAR | Status: AC | PRN
  Administered 2017-02-15 (×2): 50 ug via INTRAVENOUS

## 2017-02-15 NOTE — OR Nursing (Signed)
Pt reports fsbs at home at 7 am 98.

## 2017-02-15 NOTE — Discharge Instructions (Signed)
Needle Biopsy of the Bone °Introduction °A bone biopsy is a procedure in which a small sample of bone is removed. The sample is taken with a needle. Then, the bone sample is looked at under a microscope to check for abnormalities. The sample is usually taken from a bone that is close to the skin. This procedure may be done to check for various problems with the bone. You may need this procedure if imaging tests or blood tests have indicated a possible problem. This procedure may be done to help determine if a bone tumor is cancerous (malignant). A bone biopsy can help to diagnose problems such as: °· Tumors of the bone (sarcomas) and bone marrow (multiple myeloma). °· Bone that forms abnormally (Paget disease). °· Noncancerous (benign) bone cysts. °· Bony growths. °· Infections in the bone. °Tell a health care provider about: °· Any allergies you have. °· All medicines you are taking, including vitamins, herbs, eye drops, creams, and over-the-counter medicines. °· Any problems you or family members have had with anesthetic medicines. °· Any blood disorders you have. °· Any surgeries you have had. °· Any medical conditions you have. °What are the risks? °Generally, this is a safe procedure. However, problems may occur, including: °· Excessive bleeding. °· Infection. °· Injury to surrounding tissue. °What happens before the procedure? °· Ask your health care provider about: °¨ Changing or stopping your regular medicines. This is especially important if you are taking diabetes medicines or blood thinners. °¨ Taking medicines such as aspirin and ibuprofen. These medicines can thin your blood. Do not take these medicines before your procedure if your health care provider instructs you not to. °· Follow instructions from your health care provider about eating or drinking restrictions. °· Plan to have someone take you home after the procedure. °· If you go home right after the procedure, plan to have someone with you for  24 hours. °What happens during the procedure? °· An IV tube may be inserted into one of your veins. °· The injection site will be cleaned with a germ-killing solution (antiseptic). °· You will be given one or more of the following: °¨ A medicine to help you relax (sedative). °¨ A medicine to numb the area (local anesthetic). °· The sample of bone will be removed by putting a large needle through the skin and into the bone. °· The needle will be removed. °· A bandage (dressing) will be placed over the insertion site and taped in place. °The procedure may vary among health care providers and hospitals. °What happens after the procedure? °· Your blood pressure, heart rate, breathing rate, and blood oxygen level will be monitored often until the medicines you were given have worn off. °· Return to your normal activities as told by your health care provider. °This information is not intended to replace advice given to you by your health care provider. Make sure you discuss any questions you have with your health care provider. °Document Released: 10/21/2004 Document Revised: 05/20/2016 Document Reviewed: 01/20/2015 °© 2017 Elsevier ° °

## 2017-02-15 NOTE — Procedures (Signed)
CT-guided  R iliac bone marrow aspiration and core biopsy No complication No blood loss. See complete dictation in [facility] PACS  

## 2017-02-18 ENCOUNTER — Encounter: Payer: Self-pay | Admitting: Vascular Surgery

## 2017-02-24 LAB — CHROMOSOME ANALYSIS, BONE MARROW

## 2017-02-24 LAB — TISSUE HYBRIDIZATION (BONE MARROW)-NCBH

## 2017-03-03 ENCOUNTER — Encounter (HOSPITAL_COMMUNITY): Payer: Self-pay

## 2017-03-09 NOTE — Progress Notes (Signed)
[place]  [phone] [phone]  ID: Joyce Potter OB: [birth year]  MR#: [id number]  QPR#:[id number]  Patient Care Team: [place] Pink, MD as PCP - General (Family Medicine) Manya Silvas, MD (Gastroenterology)  CHIEF COMPLAINT: Iron deficiency anemia, secondary to chronic blood loss.  INTERVAL HISTORY: Patient returns to clinic today for further evaluation, discussion of her bone marrow biopsy results, and consideration of additional IV iron. She states she has changed her diet and does not feel as weak and fatigued today. She has no neurologic complaints. She denies any recent fevers. She has had intentional weight loss since changing her diet. She has no cough or hemoptysis. She denies any nausea, vomiting, constipation, or diarrhea. She denies melena. She has no urinary complaints. Patient offers no further specific complaints today.   REVIEW OF SYSTEMS:   Review of Systems  Constitutional: Negative for fever, malaise/fatigue and weight loss.  Eyes: Negative.  Negative for blurred vision.  Respiratory: Negative for cough, hemoptysis and shortness of breath.   Cardiovascular: Negative for chest pain and leg swelling.  Gastrointestinal: Negative for abdominal pain, blood in stool and melena.  Genitourinary: Negative for hematuria.  Musculoskeletal: Negative.   Neurological: Negative for dizziness and weakness.  Psychiatric/Behavioral: The patient is nervous/anxious.     As per HPI. Otherwise, a complete review of systems is negative.  PAST MEDICAL HISTORY: Past Medical History:  Diagnosis Date  . Anemia    iron deficiency  . Aortic stenosis   . Basal cell carcinoma   . CAD (coronary artery disease)    s/p Left circumflex stent in 2012  . Carotid stenosis   . Cataract   . High cholesterol   . HTN (hypertension)   . Hyperlipidemia   . IDDM (insulin dependent diabetes mellitus) ([place])   . Multiple thyroid nodules    Benign    . Peptic ulcer disease   . Retinal artery occlusion    on left    PAST SURGICAL HISTORY: Past Surgical History:  Procedure Laterality Date  . ARTERY BIOPSY Left 11/19/2015   Procedure: BIOPSY TEMPORAL ARTERY;  Surgeon: Algernon Huxley, MD;  Location: [facility] ORS;  Service: Vascular;  Laterality: Left;  . BREAST BIOPSY Left    core- neg  . CARDIAC CATHETERIZATION N/A 08/08/2015   Procedure: Right and Left Heart Cath and Coronary Angiography;  Surgeon: Teodoro Spray, MD;  Location: [place] [facility];  Service: Cardiovascular;  Laterality: N/A;  . CARDIAC CATHETERIZATION Bilateral 09/03/2016   Procedure: Right/Left Heart Cath and Coronary Angiography;  Surgeon: Teodoro Spray, MD;  Location: [place] [facility];  Service: Cardiovascular;  Laterality: Bilateral;  . CATARACT EXTRACTION W/ INTRAOCULAR LENS  IMPLANT, BILATERAL    . COLONOSCOPY     in 2013- internal hemorrhoids  . CORONARY ANGIOPLASTY    . CORONARY STENT PLACEMENT    . ENDARTERECTOMY Left 10/01/2015   Procedure: ENDARTERECTOMY CAROTID;  Surgeon: Algernon Huxley, MD;  Location: [facility] ORS;  Service: Vascular;  Laterality: Left;  . ESOPHAGOGASTRODUODENOSCOPY    . ESOPHAGOGASTRODUODENOSCOPY (EGD) WITH PROPOFOL N/A 05/31/2016   Procedure: ESOPHAGOGASTRODUODENOSCOPY (EGD) WITH PROPOFOL;  Surgeon: Manya Silvas, MD;  [facility] END[facility]COPY;  Service: Endoscopy;  Laterality: N/A;  . EYE SURGERY    . GIVENS CAPSULE STUDY N/A 04/17/2013   Procedure: GIVENS CAPSULE STUDY;  Surgeon: Arta Silence, MD;  Location: [facility] ENDOSCOPY;  Service: Endoscopy;  Laterality: N/A;  patient ate breakfast at 7am   . PARTIAL HYSTERECTOMY    .  TRIGGER FINGER RELEASE      FAMILY HISTORY Family History  Problem Relation Age of Onset  . Uterine cancer Mother   . Breast cancer Mother 83  . Seizures Father   . Stroke Father   . Diabetes Father   . COPD Father   . Colon cancer Neg Hx        ADVANCED DIRECTIVES:    HEALTH MAINTENANCE: Social History   Substance Use Topics  . Smoking status: Former Smoker    Quit date: 02/15/1985  . Smokeless tobacco: Never Used     Comment: quit about 31 years ago  . Alcohol use No     Colonoscopy:  PAP:  Bone density:  Lipid panel:  Allergies  Allergen Reactions  . Aspirin Other (See Comments)    ulcers  . Invokamet [Canagliflozin-Metformin Hcl] Other (See Comments)    Yeast infection  . Sulfa Antibiotics Other (See Comments)    "Got Drunk"  . Lovastatin Rash  . Penicillins Rash  . Vicodin [Hydrocodone-Acetaminophen] Rash    Current Outpatient Prescriptions  Medication Sig Dispense Refill  . albuterol (PROAIR HFA) 108 (90 Base) MCG/ACT inhaler Inhale 2 puffs into the lungs every 4 (four) hours as needed. For shortness of breath/wheezing.    Marland Kitchen ascorbic acid (VITAMIN C) 500 MG tablet Take 500 mg by mouth daily.    . Biotin (RA BIOTIN) 1000 MCG tablet Take 1,000 mcg by mouth daily.    . Black Pepper-Turmeric (TURMERIC COMPLEX/BLACK PEPPER PO) Take 1 capsule by mouth daily.    . Flaxseed, Linseed, (FLAXSEED OIL) 1000 MG CAPS Take 1,000 mg by mouth daily.    Marland Kitchen gabapentin (NEURONTIN) 300 MG capsule Take 1 capsule by mouth at bedtime.    Marland Kitchen GARLIC PO Take 1 capsule by mouth daily.    . insulin NPH-regular Human (NOVOLIN 70/30) (70-30) 100 UNIT/ML injection Inject 45 Units into the skin daily with breakfast.     . losartan (COZAAR) 100 MG tablet Take 100 mg by mouth daily.    . metoprolol (LOPRESSOR) 100 MG tablet Take 100 mg by mouth 2 (two) times daily.    . Multiple Vitamins-Minerals (MULTIVITAMIN WITH MINERALS) tablet Take 1 tablet by mouth daily.    Marland Kitchen omeprazole (PRILOSEC) 40 MG capsule Take 40 mg by mouth daily.    . pravastatin (PRAVACHOL) 10 MG tablet Take 1 tablet (10 mg total) by mouth daily at 6 PM. 30 tablet 0  . sertraline (ZOLOFT) 100 MG tablet Take 100 mg by mouth at bedtime as needed.     No current facility-administered medications for this visit.     OBJECTIVE: Vitals:    03/10/17 1352  BP: (!) 147/46  Pulse: 78  Resp: 18  Temp: 98.8 F (37.1 C)     Body mass index is 34.82 kg/m.    ECOG FS:1 - Symptomatic but completely ambulatory  General: Well-developed, well-nourished, no acute distress. Eyes: Pink conjunctiva, anicteric sclera. Lungs: Clear to auscultation bilaterally. Heart: Regular rate and rhythm. No rubs, murmurs, or gallops. Abdomen: Soft, nontender, nondistended. No organomegaly noted, normoactive bowel sounds. Musculoskeletal: No edema, cyanosis, or clubbing. Neuro: Alert, answering all questions appropriately. Cranial nerves grossly intact. Skin: No rashes or petechiae noted. Psych: Normal affect.   LAB RESULTS:  Lab Results  Component Value Date   NA 139 11/28/2016   K 4.4 11/28/2016   CL 108 11/28/2016   CO2 25 11/28/2016   GLUCOSE 152 (H) 11/28/2016   BUN 25 (H) 11/28/2016   CREATININE  0.78 11/28/2016   CALCIUM 9.0 11/28/2016   PROT 6.8 11/28/2016   ALBUMIN 3.8 11/28/2016   AST 19 11/28/2016   ALT 17 11/28/2016   ALKPHOS 73 11/28/2016   BILITOT 0.4 11/28/2016   GFRNONAA >60 11/28/2016   GFRAA >60 11/28/2016    Lab Results  Component Value Date   WBC 4.2 03/10/2017   NEUTROABS 2.1 03/10/2017   HGB 11.4 (L) 03/10/2017   HCT 34.0 (L) 03/10/2017   MCV 89.2 03/10/2017   PLT 273 03/10/2017   Lab Results  Component Value Date   IRON 45 03/10/2017   TIBC 269 03/10/2017   IRONPCTSAT 17 03/10/2017    Lab Results  Component Value Date   FERRITIN 129 03/10/2017     STUDIES: Ct Biopsy  Result Date: Feb 23, 2017 CLINICAL DATA:  Persistent symptomatic anemia EXAM: CT GUIDED DEEP ILIAC BONE ASPIRATION AND CORE BIOPSY TECHNIQUE: The procedure, risks (including but not limited to bleeding, infection, organ damage ), benefits, and alternatives were explained to the patient. Questions regarding the procedure were encouraged and answered. The patient understands and consents to the procedure. Patient was placed supine on the  CT gantry and limited axial scans through the pelvis were obtained. Appropriate skin entry site was identified. Skin site was marked, prepped with Betadine, draped in usual sterile fashion, and infiltrated locally with 1% lidocaine. Intravenous Fentanyl and Versed were administered as conscious sedation during continuous monitoring of the patient's level of consciousness and physiological / cardiorespiratory status by the radiology RN, with a total moderate sedation time of 17 minutes. Under CT fluoroscopic guidance an 11-gauge Cook trocar bone needle was advanced into the right iliac bone just lateral to the sacroiliac joint. Once needle tip position was confirmed, coaxial core x2 and aspiration samples were obtained. Post procedure scans show no hematoma or fracture. Patient tolerated procedure well. COMPLICATIONS: COMPLICATIONS none IMPRESSION: 1. Technically successful CT guided right iliac bone core and aspiration biopsy. Electronically Signed   By: Lucrezia Europe M.D.   On: 23-Feb-2017 10:42    ASSESSMENT: Iron deficiency anemia, secondary to chronic blood loss.  PLAN:    1. Iron deficiency anemia, secondary to chronic blood loss: Patient's hemoglobin has improved with 3 doses of IV Feraheme in February 2018. For completeness, bone marrow biopsy was done on 02-23-17 that did not reveal any significant pathology. She currently feels well. She does not require IV Feraheme today.  Return to clinic in 2 months with repeat laboratory work and further evaluation.  2. Melena: Resolved. Patient has had multiple EGDs, colonoscopies, and capsule endoscopies with no obvious the etiology of her bleeding. Monitor and continue evaluation by GI.  3. Chest pain/shortness of breath: Resolved. Follow-up with cardiology.  4. Hypertension: Patient's blood pressure is elevated today, continue current medications as prescribed.  Approximately 30 minutes was spent in discussion of which greater than 50% was  consultation.    Patient expressed understanding and was in agreement with this plan. She also understands that She can call clinic at any time with any questions, concerns, or complaints.    Lloyd Huger, MD 03/13/17 9:59 AM

## 2017-03-10 ENCOUNTER — Inpatient Hospital Stay: Payer: Medicare Other | Attending: Oncology | Admitting: Oncology

## 2017-03-10 ENCOUNTER — Inpatient Hospital Stay: Payer: Medicare Other

## 2017-03-10 VITALS — BP 147/46 | HR 78 | Temp 98.8°F | Resp 18 | Wt 196.5 lb

## 2017-03-10 DIAGNOSIS — K648 Other hemorrhoids: Secondary | ICD-10-CM | POA: Diagnosis not present

## 2017-03-10 DIAGNOSIS — Z79899 Other long term (current) drug therapy: Secondary | ICD-10-CM | POA: Diagnosis not present

## 2017-03-10 DIAGNOSIS — I2510 Atherosclerotic heart disease of native coronary artery without angina pectoris: Secondary | ICD-10-CM | POA: Insufficient documentation

## 2017-03-10 DIAGNOSIS — Z955 Presence of coronary angioplasty implant and graft: Secondary | ICD-10-CM | POA: Insufficient documentation

## 2017-03-10 DIAGNOSIS — Z794 Long term (current) use of insulin: Secondary | ICD-10-CM | POA: Diagnosis not present

## 2017-03-10 DIAGNOSIS — I350 Nonrheumatic aortic (valve) stenosis: Secondary | ICD-10-CM | POA: Diagnosis not present

## 2017-03-10 DIAGNOSIS — E119 Type 2 diabetes mellitus without complications: Secondary | ICD-10-CM | POA: Diagnosis not present

## 2017-03-10 DIAGNOSIS — I10 Essential (primary) hypertension: Secondary | ICD-10-CM

## 2017-03-10 DIAGNOSIS — Z87891 Personal history of nicotine dependence: Secondary | ICD-10-CM | POA: Diagnosis not present

## 2017-03-10 DIAGNOSIS — D500 Iron deficiency anemia secondary to blood loss (chronic): Secondary | ICD-10-CM | POA: Diagnosis not present

## 2017-03-10 DIAGNOSIS — E7800 Pure hypercholesterolemia, unspecified: Secondary | ICD-10-CM | POA: Insufficient documentation

## 2017-03-10 LAB — CBC WITH DIFFERENTIAL/PLATELET
BASOS ABS: 0 10*3/uL (ref 0–0.1)
BASOS PCT: 1 %
Eosinophils Absolute: 0.2 10*3/uL (ref 0–0.7)
Eosinophils Relative: 5 %
HCT: 34 % — ABNORMAL LOW (ref 35.0–47.0)
Hemoglobin: 11.4 g/dL — ABNORMAL LOW (ref 12.0–16.0)
Lymphocytes Relative: 34 %
Lymphs Abs: 1.4 10*3/uL (ref 1.0–3.6)
MCH: 30 pg (ref 26.0–34.0)
MCHC: 33.6 g/dL (ref 32.0–36.0)
MCV: 89.2 fL (ref 80.0–100.0)
MONO ABS: 0.4 10*3/uL (ref 0.2–0.9)
Monocytes Relative: 10 %
NEUTROS ABS: 2.1 10*3/uL (ref 1.4–6.5)
Neutrophils Relative %: 50 %
Platelets: 273 10*3/uL (ref 150–440)
RBC: 3.81 MIL/uL (ref 3.80–5.20)
RDW: 15.3 % — AB (ref 11.5–14.5)
WBC: 4.2 10*3/uL (ref 3.6–11.0)

## 2017-03-10 LAB — IRON AND TIBC
IRON: 45 ug/dL (ref 28–170)
Saturation Ratios: 17 % (ref 10.4–31.8)
TIBC: 269 ug/dL (ref 250–450)
UIBC: 224 ug/dL

## 2017-03-10 LAB — FERRITIN: FERRITIN: 129 ng/mL (ref 11–307)

## 2017-03-10 NOTE — Progress Notes (Signed)
Patient here today for follow up.  Patient states no new concerns today  

## 2017-05-08 NOTE — Progress Notes (Signed)
[place]  [phone]) [phone] Fax:(336) [phone]  ID: Joyce Potter OB: 09-Jun-1946  MR#: [id number]  JHE#:[id number]  Patient Care Team: [place] Pink, MD as PCP - General (Family Medicine) Manya Silvas, MD (Gastroenterology)  CHIEF COMPLAINT: Iron deficiency anemia, secondary to chronic blood loss.  INTERVAL HISTORY: Patient returns to clinic today for further evaluation, laboratory work, and consideration of additional IV iron. She currently feels well and is asymptomatic. She does not complain of weakness or fatigue. She has no neurologic complaints. She denies any recent fevers. She has had intentional weight loss since changing her diet. She has no cough or hemoptysis. She denies any nausea, vomiting, constipation, or diarrhea. She denies melena. She has no urinary complaints. Patient offers no further specific complaints today.   REVIEW OF SYSTEMS:   Review of Systems  Constitutional: Negative for fever, malaise/fatigue and weight loss.  Eyes: Negative.  Negative for blurred vision.  Respiratory: Negative for cough, hemoptysis and shortness of breath.   Cardiovascular: Negative for chest pain and leg swelling.  Gastrointestinal: Negative for abdominal pain, blood in stool and melena.  Genitourinary: Negative for hematuria.  Musculoskeletal: Negative.   Skin: Negative.  Negative for rash.  Neurological: Negative for dizziness and weakness.  Psychiatric/Behavioral: The patient is nervous/anxious.     As per HPI. Otherwise, a complete review of systems is negative.  PAST MEDICAL HISTORY: Past Medical History:  Diagnosis Date  . Anemia    iron deficiency  . Aortic stenosis   . Basal cell carcinoma   . CAD (coronary artery disease)    s/p Left circumflex stent in 2012  . Carotid stenosis   . Cataract   . High cholesterol   . HTN (hypertension)   . Hyperlipidemia   . IDDM (insulin dependent diabetes mellitus) ([place])   . Multiple thyroid  nodules    Benign  . Peptic ulcer disease   . Retinal artery occlusion    on left    PAST SURGICAL HISTORY: Past Surgical History:  Procedure Laterality Date  . ARTERY BIOPSY Left 11/19/2015   Procedure: BIOPSY TEMPORAL ARTERY;  Surgeon: Algernon Huxley, MD;  Location: [facility] ORS;  Service: Vascular;  Laterality: Left;  . BREAST BIOPSY Left    core- neg  . CARDIAC CATHETERIZATION N/A 08/08/2015   Procedure: Right and Left Heart Cath and Coronary Angiography;  Surgeon: Teodoro Spray, MD;  Location: [place] [facility];  Service: Cardiovascular;  Laterality: N/A;  . CARDIAC CATHETERIZATION Bilateral 09/03/2016   Procedure: Right/Left Heart Cath and Coronary Angiography;  Surgeon: Teodoro Spray, MD;  Location: [place] [facility];  Service: Cardiovascular;  Laterality: Bilateral;  . CATARACT EXTRACTION W/ INTRAOCULAR LENS  IMPLANT, BILATERAL    . COLONOSCOPY     in 2013- internal hemorrhoids  . CORONARY ANGIOPLASTY    . CORONARY STENT PLACEMENT    . ENDARTERECTOMY Left 10/01/2015   Procedure: ENDARTERECTOMY CAROTID;  Surgeon: Algernon Huxley, MD;  Location: [facility] ORS;  Service: Vascular;  Laterality: Left;  . ESOPHAGOGASTRODUODENOSCOPY    . ESOPHAGOGASTRODUODENOSCOPY (EGD) WITH PROPOFOL N/A 05/31/2016   Procedure: ESOPHAGOGASTRODUODENOSCOPY (EGD) WITH PROPOFOL;  Surgeon: Manya Silvas, MD;  Location: [facility] ENDOSCOPY;  Service: Endoscopy;  Laterality: N/A;  . EYE SURGERY    . GIVENS CAPSULE STUDY N/A 04/17/2013   Procedure: GIVENS CAPSULE STUDY;  Surgeon: Arta Silence, MD;  Location: [facility] ENDOSCOPY;  Service: Endoscopy;  Laterality: N/A;  patient ate breakfast at 7am   . PARTIAL HYSTERECTOMY    .  TRIGGER FINGER RELEASE      FAMILY HISTORY Family History  Problem Relation Age of Onset  . Uterine cancer Mother   . Breast cancer Mother 34  . Seizures Father   . Stroke Father   . Diabetes Father   . COPD Father   . Colon cancer Neg Hx        ADVANCED DIRECTIVES:    HEALTH  MAINTENANCE: Social History  Substance Use Topics  . Smoking status: Former Smoker    Quit date: 02/15/1985  . Smokeless tobacco: Never Used     Comment: quit about 31 years ago  . Alcohol use No     Colonoscopy:  PAP:  Bone density:  Lipid panel:  Allergies  Allergen Reactions  . Aspirin Other (See Comments)    ulcers  . Invokamet [Canagliflozin-Metformin Hcl] Other (See Comments)    Yeast infection  . Sulfa Antibiotics Other (See Comments)    "Got Drunk"  . Lovastatin Rash  . Penicillins Rash  . Vicodin [Hydrocodone-Acetaminophen] Rash    Current Outpatient Prescriptions  Medication Sig Dispense Refill  . albuterol (PROAIR HFA) 108 (90 Base) MCG/ACT inhaler Inhale 2 puffs into the lungs every 4 (four) hours as needed. For shortness of breath/wheezing.    Marland Kitchen ascorbic acid (VITAMIN C) 500 MG tablet Take 500 mg by mouth daily.    . Biotin (RA BIOTIN) 1000 MCG tablet Take 1,000 mcg by mouth daily.    . Black Pepper-Turmeric (TURMERIC COMPLEX/BLACK PEPPER PO) Take 1 capsule by mouth daily.    . Flaxseed, Linseed, (FLAXSEED OIL) 1000 MG CAPS Take 1,000 mg by mouth daily.    Marland Kitchen gabapentin (NEURONTIN) 300 MG capsule Take 1 capsule by mouth at bedtime.    Marland Kitchen GARLIC PO Take 1 capsule by mouth daily.    . insulin NPH-regular Human (NOVOLIN 70/30) (70-30) 100 UNIT/ML injection Inject 45 Units into the skin daily with breakfast.     . losartan (COZAAR) 100 MG tablet Take 100 mg by mouth daily.    . metoprolol (LOPRESSOR) 100 MG tablet Take 100 mg by mouth 2 (two) times daily.    . Multiple Vitamins-Minerals (MULTIVITAMIN WITH MINERALS) tablet Take 1 tablet by mouth daily.    Marland Kitchen omeprazole (PRILOSEC) 40 MG capsule Take 40 mg by mouth daily.    . pravastatin (PRAVACHOL) 10 MG tablet Take 1 tablet (10 mg total) by mouth daily at 6 PM. 30 tablet 0  . sertraline (ZOLOFT) 100 MG tablet Take 100 mg by mouth at bedtime as needed.     No current facility-administered medications for this visit.      OBJECTIVE: Vitals:   05/10/17 1343  BP: (!) 155/71  Pulse: (!) 58  Resp: 18  Temp: 97.1 F (36.2 C)     Body mass index is 33.96 kg/m.    ECOG FS:1 - Symptomatic but completely ambulatory  General: Well-developed, well-nourished, no acute distress. Eyes: Pink conjunctiva, anicteric sclera. Lungs: Clear to auscultation bilaterally. Heart: Regular rate and rhythm. No rubs, murmurs, or gallops. Abdomen: Soft, nontender, nondistended. No organomegaly noted, normoactive bowel sounds. Musculoskeletal: No edema, cyanosis, or clubbing. Neuro: Alert, answering all questions appropriately. Cranial nerves grossly intact. Skin: No rashes or petechiae noted. Psych: Normal affect.   LAB RESULTS:  Lab Results  Component Value Date   NA 139 11/28/2016   K 4.4 11/28/2016   CL 108 11/28/2016   CO2 25 11/28/2016   GLUCOSE 152 (H) 11/28/2016   BUN 25 (H) 11/28/2016  CREATININE 0.78 11/28/2016   CALCIUM 9.0 11/28/2016   PROT 6.8 11/28/2016   ALBUMIN 3.8 11/28/2016   AST 19 11/28/2016   ALT 17 11/28/2016   ALKPHOS 73 11/28/2016   BILITOT 0.4 11/28/2016   GFRNONAA >60 11/28/2016   GFRAA >60 11/28/2016    Lab Results  Component Value Date   WBC 5.1 05/10/2017   NEUTROABS 3.2 05/10/2017   HGB 10.8 (L) 05/10/2017   HCT 30.6 (L) 05/10/2017   MCV 85.2 05/10/2017   PLT 255 05/10/2017   Lab Results  Component Value Date   IRON 31 05/10/2017   TIBC 251 05/10/2017   IRONPCTSAT 12 05/10/2017    Lab Results  Component Value Date   FERRITIN 29 05/10/2017     STUDIES: No results found.  ASSESSMENT: Iron deficiency anemia, secondary to chronic blood loss.  PLAN:    1. Iron deficiency anemia, secondary to chronic blood loss: Patient's hemoglobin and iron stores have trended down slightly. Previously, the remainder of her laboratory work is either negative or within normal limits.  Bone marrow biopsy was done on February 15, 2017 did not reveal any significant pathology. She  currently feels well. Return to clinic on May 12, 2017 to receive one infusion of IV Feraheme. Patient last received IV iron on February 10, 2017. Return to clinic in 3 months with repeat laboratory work and further evaluation.  2. Melena: Resolved. Patient has had multiple EGDs, colonoscopies, and capsule endoscopies with no obvious the etiology of her bleeding. Monitor and continue evaluation by GI.  3. Chest pain/shortness of breath: Resolved. Follow-up with cardiology.  4. Hypertension: Patient's blood pressure is elevated today, continue current medications as prescribed.  Approximately 30 minutes was spent in discussion of which greater than 50% was consultation.    Patient expressed understanding and was in agreement with this plan. She also understands that She can call clinic at any time with any questions, concerns, or complaints.    Lloyd Huger, MD 05/11/17 1:28 PM

## 2017-05-10 ENCOUNTER — Inpatient Hospital Stay: Payer: Medicare Other

## 2017-05-10 ENCOUNTER — Inpatient Hospital Stay: Payer: Medicare Other | Attending: Oncology

## 2017-05-10 ENCOUNTER — Inpatient Hospital Stay (HOSPITAL_BASED_OUTPATIENT_CLINIC_OR_DEPARTMENT_OTHER): Payer: Medicare Other | Admitting: Oncology

## 2017-05-10 VITALS — BP 155/71 | HR 58 | Temp 97.1°F | Resp 18 | Wt 191.7 lb

## 2017-05-10 DIAGNOSIS — I10 Essential (primary) hypertension: Secondary | ICD-10-CM | POA: Insufficient documentation

## 2017-05-10 DIAGNOSIS — K648 Other hemorrhoids: Secondary | ICD-10-CM | POA: Insufficient documentation

## 2017-05-10 DIAGNOSIS — I2510 Atherosclerotic heart disease of native coronary artery without angina pectoris: Secondary | ICD-10-CM | POA: Insufficient documentation

## 2017-05-10 DIAGNOSIS — D500 Iron deficiency anemia secondary to blood loss (chronic): Secondary | ICD-10-CM | POA: Diagnosis not present

## 2017-05-10 DIAGNOSIS — Z79899 Other long term (current) drug therapy: Secondary | ICD-10-CM

## 2017-05-10 DIAGNOSIS — E119 Type 2 diabetes mellitus without complications: Secondary | ICD-10-CM | POA: Diagnosis not present

## 2017-05-10 DIAGNOSIS — Z955 Presence of coronary angioplasty implant and graft: Secondary | ICD-10-CM | POA: Diagnosis not present

## 2017-05-10 DIAGNOSIS — Z85828 Personal history of other malignant neoplasm of skin: Secondary | ICD-10-CM | POA: Diagnosis not present

## 2017-05-10 DIAGNOSIS — Z794 Long term (current) use of insulin: Secondary | ICD-10-CM | POA: Diagnosis not present

## 2017-05-10 DIAGNOSIS — I350 Nonrheumatic aortic (valve) stenosis: Secondary | ICD-10-CM | POA: Diagnosis not present

## 2017-05-10 DIAGNOSIS — Z87891 Personal history of nicotine dependence: Secondary | ICD-10-CM | POA: Insufficient documentation

## 2017-05-10 DIAGNOSIS — E7800 Pure hypercholesterolemia, unspecified: Secondary | ICD-10-CM | POA: Insufficient documentation

## 2017-05-10 LAB — IRON AND TIBC
Iron: 31 ug/dL (ref 28–170)
SATURATION RATIOS: 12 % (ref 10.4–31.8)
TIBC: 251 ug/dL (ref 250–450)
UIBC: 220 ug/dL

## 2017-05-10 LAB — CBC WITH DIFFERENTIAL/PLATELET
BASOS ABS: 0.1 10*3/uL (ref 0–0.1)
BASOS PCT: 1 %
EOS ABS: 0.2 10*3/uL (ref 0–0.7)
Eosinophils Relative: 5 %
HCT: 30.6 % — ABNORMAL LOW (ref 35.0–47.0)
HEMOGLOBIN: 10.8 g/dL — AB (ref 12.0–16.0)
Lymphocytes Relative: 23 %
Lymphs Abs: 1.2 10*3/uL (ref 1.0–3.6)
MCH: 29.9 pg (ref 26.0–34.0)
MCHC: 35.1 g/dL (ref 32.0–36.0)
MCV: 85.2 fL (ref 80.0–100.0)
Monocytes Absolute: 0.5 10*3/uL (ref 0.2–0.9)
Monocytes Relative: 10 %
NEUTROS ABS: 3.2 10*3/uL (ref 1.4–6.5)
NEUTROS PCT: 61 %
Platelets: 255 10*3/uL (ref 150–440)
RBC: 3.6 MIL/uL — AB (ref 3.80–5.20)
RDW: 14.4 % (ref 11.5–14.5)
WBC: 5.1 10*3/uL (ref 3.6–11.0)

## 2017-05-10 LAB — FERRITIN: FERRITIN: 29 ng/mL (ref 11–307)

## 2017-05-10 NOTE — Progress Notes (Signed)
Patient is here for follow up she is doing well no major complaints  

## 2017-05-12 ENCOUNTER — Inpatient Hospital Stay: Payer: Medicare Other

## 2017-05-12 VITALS — BP 161/73 | HR 70 | Temp 97.9°F | Resp 18

## 2017-05-12 DIAGNOSIS — D500 Iron deficiency anemia secondary to blood loss (chronic): Secondary | ICD-10-CM

## 2017-05-12 MED ORDER — FERUMOXYTOL INJECTION 510 MG/17 ML
510.0000 mg | Freq: Once | INTRAVENOUS | Status: AC
  Administered 2017-05-12: 510 mg via INTRAVENOUS
  Filled 2017-05-12: qty 17

## 2017-05-12 MED ORDER — SODIUM CHLORIDE 0.9 % IV SOLN
Freq: Once | INTRAVENOUS | Status: AC
  Administered 2017-05-12: 14:00:00 via INTRAVENOUS
  Filled 2017-05-12: qty 1000

## 2017-06-17 ENCOUNTER — Other Ambulatory Visit: Payer: Self-pay | Admitting: Family Medicine

## 2017-06-17 DIAGNOSIS — N6320 Unspecified lump in the left breast, unspecified quadrant: Secondary | ICD-10-CM

## 2017-06-17 DIAGNOSIS — N6459 Other signs and symptoms in breast: Secondary | ICD-10-CM

## 2017-07-04 ENCOUNTER — Ambulatory Visit
Admission: RE | Admit: 2017-07-04 | Discharge: 2017-07-04 | Disposition: A | Discharge status: Home / Self Care | Payer: Medicare Other | Source: Ambulatory Visit | Attending: Family Medicine | Admitting: Family Medicine

## 2017-07-04 DIAGNOSIS — N6320 Unspecified lump in the left breast, unspecified quadrant: Secondary | ICD-10-CM

## 2017-07-04 DIAGNOSIS — N6459 Other signs and symptoms in breast: Secondary | ICD-10-CM

## 2017-07-27 ENCOUNTER — Other Ambulatory Visit: Payer: Self-pay | Admitting: Family Medicine

## 2017-07-27 DIAGNOSIS — N6452 Nipple discharge: Secondary | ICD-10-CM

## 2017-08-05 ENCOUNTER — Other Ambulatory Visit: Payer: Self-pay

## 2017-08-05 DIAGNOSIS — D500 Iron deficiency anemia secondary to blood loss (chronic): Secondary | ICD-10-CM

## 2017-08-08 NOTE — Progress Notes (Deleted)
[place]  Telephone:(336) [phone] [phone]) [phone]  ID: Joyce Potter OB: [birth year]  MR#: [id number]  FGH#:[id number]  Patient Care Team: [place] Pink, MD as PCP - General (Family Medicine) Manya Silvas, MD (Gastroenterology)  CHIEF COMPLAINT: Iron deficiency anemia, secondary to chronic blood loss.  INTERVAL HISTORY: Patient returns to clinic today for further evaluation, laboratory work, and consideration of additional IV iron. She currently feels well and is asymptomatic. She does not complain of weakness or fatigue. She has no neurologic complaints. She denies any recent fevers. She has had intentional weight loss since changing her diet. She has no cough or hemoptysis. She denies any nausea, vomiting, constipation, or diarrhea. She denies melena. She has no urinary complaints. Patient offers no further specific complaints today.   REVIEW OF SYSTEMS:   Review of Systems  Constitutional: Negative for fever, malaise/fatigue and weight loss.  Eyes: Negative.  Negative for blurred vision.  Respiratory: Negative for cough, hemoptysis and shortness of breath.   Cardiovascular: Negative for chest pain and leg swelling.  Gastrointestinal: Negative for abdominal pain, blood in stool and melena.  Genitourinary: Negative for hematuria.  Musculoskeletal: Negative.   Skin: Negative.  Negative for rash.  Neurological: Negative for dizziness and weakness.  Psychiatric/Behavioral: The patient is nervous/anxious.     As per HPI. Otherwise, a complete review of systems is negative.  PAST MEDICAL HISTORY: Past Medical History:  Diagnosis Date  . Anemia    iron deficiency  . Aortic stenosis   . Basal cell carcinoma   . CAD (coronary artery disease)    s/p Left circumflex stent in 2012  . Carotid stenosis   . Cataract   . High cholesterol   . HTN (hypertension)   . Hyperlipidemia   . IDDM (insulin dependent diabetes mellitus) ([place])   . Multiple thyroid  nodules    Benign  . Peptic ulcer disease   . Retinal artery occlusion    on left    PAST SURGICAL HISTORY: Past Surgical History:  Procedure Laterality Date  . ARTERY BIOPSY Left 11/19/2015   Procedure: BIOPSY TEMPORAL ARTERY;  Surgeon: Algernon Huxley, MD;  Location: [facility] ORS;  Service: Vascular;  Laterality: Left;  . BREAST BIOPSY Left    core- neg  . CARDIAC CATHETERIZATION N/A 08/08/2015   Procedure: Right and Left Heart Cath and Coronary Angiography;  Surgeon: Teodoro Spray, MD;  Location: [place] [facility];  Service: Cardiovascular;  Laterality: N/A;  . CARDIAC CATHETERIZATION Bilateral 09/03/2016   Procedure: Right/Left Heart Cath and Coronary Angiography;  Surgeon: Teodoro Spray, MD;  Location: [place] [facility];  Service: Cardiovascular;  Laterality: Bilateral;  . CATARACT EXTRACTION W/ INTRAOCULAR LENS  IMPLANT, BILATERAL    . COLONOSCOPY     in 2013- internal hemorrhoids  . CORONARY ANGIOPLASTY    . CORONARY STENT PLACEMENT    . ENDARTERECTOMY Left 10/01/2015   Procedure: ENDARTERECTOMY CAROTID;  Surgeon: Algernon Huxley, MD;  Location: [facility] ORS;  Service: Vascular;  Laterality: Left;  . ESOPHAGOGASTRODUODENOSCOPY    . ESOPHAGOGASTRODUODENOSCOPY (EGD) WITH PROPOFOL N/A 05/31/2016   Procedure: ESOPHAGOGASTRODUODENOSCOPY (EGD) WITH PROPOFOL;  Surgeon: Manya Silvas, MD;  Location[facility] ENDOSCOPY;  Service: Endoscopy;  Laterality: N/A;  . EYE SURGERY    . GIVENS CAPSULE STUDY N/A 04/17/2013   Procedure: GIVENS CAPSULE STUDY;  Surgeon: Arta Silence, MD;  Location: [facility] ENDOSCOPY;  Service: Endoscopy;  Laterality: N/A;  patient ate breakfast at 7am   . PARTIAL HYSTERECTOMY    .  TRIGGER FINGER RELEASE      FAMILY HISTORY Family History  Problem Relation Age of Onset  . Uterine cancer Mother   . Breast cancer Mother 77  . Seizures Father   . Stroke Father   . Diabetes Father   . COPD Father   . Colon cancer Neg Hx        ADVANCED DIRECTIVES:    HEALTH  MAINTENANCE: Social History  Substance Use Topics  . Smoking status: Former Smoker    Quit date: 02/15/1985  . Smokeless tobacco: Never Used     Comment: quit about 31 years ago  . Alcohol use No     Colonoscopy:  PAP:  Bone density:  Lipid panel:  Allergies  Allergen Reactions  . Aspirin Other (See Comments)    ulcers  . Invokamet [Canagliflozin-Metformin Hcl] Other (See Comments)    Yeast infection  . Sulfa Antibiotics Other (See Comments)    "Got Drunk"  . Lovastatin Rash  . Penicillins Rash  . Vicodin [Hydrocodone-Acetaminophen] Rash    Current Outpatient Prescriptions  Medication Sig Dispense Refill  . albuterol (PROAIR HFA) 108 (90 Base) MCG/ACT inhaler Inhale 2 puffs into the lungs every 4 (four) hours as needed. For shortness of breath/wheezing.    Marland Kitchen ascorbic acid (VITAMIN C) 500 MG tablet Take 500 mg by mouth daily.    . Biotin (RA BIOTIN) 1000 MCG tablet Take 1,000 mcg by mouth daily.    . Black Pepper-Turmeric (TURMERIC COMPLEX/BLACK PEPPER PO) Take 1 capsule by mouth daily.    . Flaxseed, Linseed, (FLAXSEED OIL) 1000 MG CAPS Take 1,000 mg by mouth daily.    Marland Kitchen gabapentin (NEURONTIN) 300 MG capsule Take 1 capsule by mouth at bedtime.    Marland Kitchen GARLIC PO Take 1 capsule by mouth daily.    . insulin NPH-regular Human (NOVOLIN 70/30) (70-30) 100 UNIT/ML injection Inject 45 Units into the skin daily with breakfast.     . losartan (COZAAR) 100 MG tablet Take 100 mg by mouth daily.    . metoprolol (LOPRESSOR) 100 MG tablet Take 100 mg by mouth 2 (two) times daily.    . Multiple Vitamins-Minerals (MULTIVITAMIN WITH MINERALS) tablet Take 1 tablet by mouth daily.    Marland Kitchen omeprazole (PRILOSEC) 40 MG capsule Take 40 mg by mouth daily.    . pravastatin (PRAVACHOL) 10 MG tablet Take 1 tablet (10 mg total) by mouth daily at 6 PM. 30 tablet 0  . sertraline (ZOLOFT) 100 MG tablet Take 100 mg by mouth at bedtime as needed.     No current facility-administered medications for this visit.      OBJECTIVE: There were no vitals filed for this visit.   There is no height or weight on file to calculate BMI.    ECOG FS:1 - Symptomatic but completely ambulatory  General: Well-developed, well-nourished, no acute distress. Eyes: Pink conjunctiva, anicteric sclera. Lungs: Clear to auscultation bilaterally. Heart: Regular rate and rhythm. No rubs, murmurs, or gallops. Abdomen: Soft, nontender, nondistended. No organomegaly noted, normoactive bowel sounds. Musculoskeletal: No edema, cyanosis, or clubbing. Neuro: Alert, answering all questions appropriately. Cranial nerves grossly intact. Skin: No rashes or petechiae noted. Psych: Normal affect.   LAB RESULTS:  Lab Results  Component Value Date   NA 139 11/28/2016   K 4.4 11/28/2016   CL 108 11/28/2016   CO2 25 11/28/2016   GLUCOSE 152 (H) 11/28/2016   BUN 25 (H) 11/28/2016   CREATININE 0.78 11/28/2016   CALCIUM 9.0 11/28/2016   PROT  6.8 11/28/2016   ALBUMIN 3.8 11/28/2016   AST 19 11/28/2016   ALT 17 11/28/2016   ALKPHOS 73 11/28/2016   BILITOT 0.4 11/28/2016   GFRNONAA >60 11/28/2016   GFRAA >60 11/28/2016    Lab Results  Component Value Date   WBC 5.1 05/10/2017   NEUTROABS 3.2 05/10/2017   HGB 10.8 (L) 05/10/2017   HCT 30.6 (L) 05/10/2017   MCV 85.2 05/10/2017   PLT 255 05/10/2017   Lab Results  Component Value Date   IRON 31 05/10/2017   TIBC 251 05/10/2017   IRONPCTSAT 12 05/10/2017    Lab Results  Component Value Date   FERRITIN 29 05/10/2017     STUDIES: No results found.  ASSESSMENT: Iron deficiency anemia, secondary to chronic blood loss.  PLAN:    1. Iron deficiency anemia, secondary to chronic blood loss: Patient's hemoglobin and iron stores have trended down slightly. Previously, the remainder of her laboratory work is either negative or within normal limits.  Bone marrow biopsy was done on February 15, 2017 did not reveal any significant pathology. She currently feels well. Return to  clinic on May 12, 2017 to receive one infusion of IV Feraheme. Patient last received IV iron on February 10, 2017. Return to clinic in 3 months with repeat laboratory work and further evaluation.  2. Melena: Resolved. Patient has had multiple EGDs, colonoscopies, and capsule endoscopies with no obvious the etiology of her bleeding. Monitor and continue evaluation by GI.  3. Chest pain/shortness of breath: Resolved. Follow-up with cardiology.  4. Hypertension: Patient's blood pressure is elevated today, continue current medications as prescribed.  Approximately 30 minutes was spent in discussion of which greater than 50% was consultation.    Patient expressed understanding and was in agreement with this plan. She also understands that She can call clinic at any time with any questions, concerns, or complaints.    Lloyd Huger, MD 08/08/17 11:44 PM

## 2017-08-09 ENCOUNTER — Ambulatory Visit
Admission: RE | Admit: 2017-08-09 | Discharge: 2017-08-09 | Disposition: A | Discharge status: Home / Self Care | Payer: Medicare Other | Source: Ambulatory Visit | Attending: Family Medicine | Admitting: Family Medicine

## 2017-08-09 DIAGNOSIS — N6452 Nipple discharge: Secondary | ICD-10-CM

## 2017-08-09 MED ORDER — GADOBENATE DIMEGLUMINE 529 MG/ML IV SOLN
20.0000 mL | Freq: Once | INTRAVENOUS | Status: AC | PRN
  Administered 2017-08-09: 20 mL via INTRAVENOUS

## 2017-08-10 ENCOUNTER — Inpatient Hospital Stay: Payer: Medicare Other

## 2017-08-10 ENCOUNTER — Inpatient Hospital Stay: Payer: Medicare Other | Admitting: Oncology

## 2017-08-21 NOTE — Progress Notes (Signed)
[facility]  [phone] [phone]  ID: Joyce Potter OB: 03/07/1946  MR#: [id number]  HZW#:[id number]  Patient Care Team: Jerl Mina, MD as PCP - General (Family Medicine) Scot Jun, MD (Gastroenterology)  CHIEF COMPLAINT: Iron deficiency anemia, secondary to chronic blood loss.  INTERVAL HISTORY: Patient returns to clinic today for further evaluation, laboratory work, and consideration of additional IV iron. She had a recent fall after tripping over a phone cord. She continues to have right shoulder pain because of this, but otherwise feels well. She does not complain of weakness or fatigue. She has no neurologic complaints. She denies any recent fevers. She has had intentional weight loss since changing her diet. She has no cough or hemoptysis. She denies any nausea, vomiting, constipation, or diarrhea. She denies melena. She has no urinary complaints. Patient offers no further specific complaints today.   REVIEW OF SYSTEMS:   Review of Systems  Constitutional: Negative for fever, malaise/fatigue and weight loss.  Eyes: Negative.  Negative for blurred vision.  Respiratory: Negative for cough, hemoptysis and shortness of breath.   Cardiovascular: Negative for chest pain and leg swelling.  Gastrointestinal: Negative for abdominal pain, blood in stool and melena.  Genitourinary: Negative for hematuria.  Musculoskeletal: Positive for joint pain.  Skin: Negative.  Negative for rash.  Neurological: Negative for dizziness and weakness.  Psychiatric/Behavioral: The patient is nervous/anxious.     As per HPI. Otherwise, a complete review of systems is negative.  PAST MEDICAL HISTORY: Past Medical History:  Diagnosis Date  . Anemia    iron deficiency  . Aortic stenosis   . Basal cell carcinoma   . CAD (coronary artery disease)    s/p Left circumflex stent in 2012  . Carotid stenosis   . Cataract   . High cholesterol   . HTN  (hypertension)   . Hyperlipidemia   . IDDM (insulin dependent diabetes mellitus) (HCC)   . Multiple thyroid nodules    Benign  . Peptic ulcer disease   . Retinal artery occlusion    on left    PAST SURGICAL HISTORY: Past Surgical History:  Procedure Laterality Date  . ARTERY BIOPSY Left 11/19/2015   Procedure: BIOPSY TEMPORAL ARTERY;  Surgeon: Annice Needy, MD;  Location: [facility] ORS;  Service: Vascular;  Laterality: Left;  . BREAST BIOPSY Left    core- neg  . CARDIAC CATHETERIZATION N/A 08/08/2015   Procedure: Right and Left Heart Cath and Coronary Angiography;  Surgeon: Dalia Heading, MD;  Location: [facility] INVASIVE CV LAB;  Service: Cardiovascular;  Laterality: N/A;  . CARDIAC CATHETERIZATION Bilateral 09/03/2016   Procedure: Right/Left Heart Cath and Coronary Angiography;  Surgeon: Dalia Heading, MD;  Location: [facility] INVASIVE CV LAB;  Service: Cardiovascular;  Laterality: Bilateral;  . CATARACT EXTRACTION W/ INTRAOCULAR LENS  IMPLANT, BILATERAL    . COLONOSCOPY     in 2013- internal hemorrhoids  . CORONARY ANGIOPLASTY    . CORONARY STENT PLACEMENT    . ENDARTERECTOMY Left 10/01/2015   Procedure: ENDARTERECTOMY CAROTID;  Surgeon: Annice Needy, MD;  Location: [facility] ORS;  Service: Vascular;  Laterality: Left;  . ESOPHAGOGASTRODUODENOSCOPY    . ESOPHAGOGASTRODUODENOSCOPY (EGD) WITH PROPOFOL N/A 05/31/2016   Procedure: ESOPHAGOGASTRODUODENOSCOPY (EGD) WITH PROPOFOL;  Surgeon: Scot Jun, MD;  Location: [facility] ENDOSCOPY;  Service: Endoscopy;  Laterality: N/A;  . EYE SURGERY    . GIVENS CAPSULE STUDY N/A 04/17/2013   Procedure: GIVENS CAPSULE STUDY;  Surgeon: Willis Modena, MD;  Location:  [place] ENDOSCOPY;  Service: Endoscopy;  Laterality: N/A;  patient ate breakfast at 7am   . PARTIAL HYSTERECTOMY    . TRIGGER FINGER RELEASE      FAMILY HISTORY Family History  Problem Relation Age of Onset  . Uterine cancer Mother   . Breast cancer Mother 36  . Seizures Father   . Stroke Father   .  Diabetes Father   . COPD Father   . Colon cancer Neg Hx        ADVANCED DIRECTIVES:    HEALTH MAINTENANCE: Social History  Substance Use Topics  . Smoking status: Former Smoker    Quit date: 02/15/1985  . Smokeless tobacco: Never Used     Comment: quit about 31 years ago  . Alcohol use No     Colonoscopy:  PAP:  Bone density:  Lipid panel:  Allergies  Allergen Reactions  . Aspirin Other (See Comments)    ulcers  . Invokamet [Canagliflozin-Metformin Hcl] Other (See Comments)    Yeast infection  . Sulfa Antibiotics Other (See Comments)    "Got Drunk"  . Lovastatin Rash  . Penicillins Rash  . Vicodin [Hydrocodone-Acetaminophen] Rash    Current Outpatient Prescriptions  Medication Sig Dispense Refill  . albuterol (PROAIR HFA) 108 (90 Base) MCG/ACT inhaler Inhale 2 puffs into the lungs every 4 (four) hours as needed. For shortness of breath/wheezing.    Marland Kitchen ascorbic acid (VITAMIN C) 500 MG tablet Take 500 mg by mouth daily.    . Biotin (RA BIOTIN) 1000 MCG tablet Take 1,000 mcg by mouth daily.    . Black Pepper-Turmeric (TURMERIC COMPLEX/BLACK PEPPER PO) Take 1 capsule by mouth daily.    . COLLAGEN PO Take by mouth.    . Flaxseed, Linseed, (FLAXSEED OIL) 1000 MG CAPS Take 1,000 mg by mouth daily.    Marland Kitchen gabapentin (NEURONTIN) 300 MG capsule Take 1 capsule by mouth at bedtime.    Marland Kitchen GARLIC PO Take 1 capsule by mouth daily.    . insulin NPH-regular Human (NOVOLIN 70/30) (70-30) 100 UNIT/ML injection Inject 45 Units into the skin daily with breakfast.     . losartan (COZAAR) 100 MG tablet Take 100 mg by mouth daily.    . metoprolol (LOPRESSOR) 100 MG tablet Take 100 mg by mouth 2 (two) times daily.    . Multiple Vitamins-Minerals (MULTIVITAMIN WITH MINERALS) tablet Take 1 tablet by mouth daily.    Marland Kitchen omeprazole (PRILOSEC) 40 MG capsule Take 40 mg by mouth daily.    . pravastatin (PRAVACHOL) 10 MG tablet Take 1 tablet (10 mg total) by mouth daily at 6 PM. 30 tablet 0  .  sertraline (ZOLOFT) 100 MG tablet Take 100 mg by mouth at bedtime as needed.    Marland Kitchen alendronate (FOSAMAX) 70 MG tablet Take by mouth. Has not started yet     No current facility-administered medications for this visit.     OBJECTIVE: Vitals:   08/24/17 1408  BP: (!) 167/72  Pulse: (!) 55  Resp: 18  Temp: 97.9 F (36.6 C)     Body mass index is 33.6 kg/m.    ECOG FS:1 - Symptomatic but completely ambulatory  General: Well-developed, well-nourished, no acute distress. Eyes: Pink conjunctiva, anicteric sclera. Lungs: Clear to auscultation bilaterally. Heart: Regular rate and rhythm. No rubs, murmurs, or gallops. Abdomen: Soft, nontender, nondistended. No organomegaly noted, normoactive bowel sounds. Musculoskeletal: No edema, cyanosis, or clubbing. Neuro: Alert, answering all questions appropriately. Cranial nerves grossly intact. Skin: No rashes or petechiae noted.  Psych: Normal affect.   LAB RESULTS:  Lab Results  Component Value Date   NA 139 11/28/2016   K 4.4 11/28/2016   CL 108 11/28/2016   CO2 25 11/28/2016   GLUCOSE 152 (H) 11/28/2016   BUN 25 (H) 11/28/2016   CREATININE 0.78 11/28/2016   CALCIUM 9.0 11/28/2016   PROT 6.8 11/28/2016   ALBUMIN 3.8 11/28/2016   AST 19 11/28/2016   ALT 17 11/28/2016   ALKPHOS 73 11/28/2016   BILITOT 0.4 11/28/2016   GFRNONAA >60 11/28/2016   GFRAA >60 11/28/2016    Lab Results  Component Value Date   WBC 5.4 08/24/2017   NEUTROABS 3.5 08/24/2017   HGB 11.6 (L) 08/24/2017   HCT 33.6 (L) 08/24/2017   MCV 87.7 08/24/2017   PLT 267 08/24/2017   Lab Results  Component Value Date   IRON 48 08/24/2017   TIBC 259 08/24/2017   IRONPCTSAT 19 08/24/2017    Lab Results  Component Value Date   FERRITIN 90 08/24/2017     STUDIES: Mr Breast Bilateral W Wo Contrast  Result Date: 08/09/2017 CLINICAL DATA:  Left retroareolar mass felt on recent physical examination. When squeezed, bloody discharge was expressed from the  nipple. No reported spontaneous discharge. LABS:  Creatinine was obtained on site at [place] at [address] Results: Creatinine 0.7 mg/dL. EXAM: BILATERAL BREAST MRI WITH AND WITHOUT CONTRAST TECHNIQUE: Multiplanar, multisequence MR images of both breasts were obtained prior to and following the intravenous administration of 20 ml of MultiHance. THREE-DIMENSIONAL MR IMAGE RENDERING ON INDEPENDENT WORKSTATION: Three-dimensional MR images were rendered by post-processing of the original MR data on an independent workstation. The three-dimensional MR images were interpreted, and findings are reported in the following complete MRI report for this study. Three dimensional images were evaluated at the independent DynaCad workstation COMPARISON:  Bilateral screening mammogram dated 01/10/2017 and left diagnostic mammogram and left breast ultrasound dated 07/04/2017 at [place]: Breast composition: b. Scattered fibroglandular tissue. Background parenchymal enhancement: Minimal. Right breast: No mass or abnormal enhancement. Left breast: No mass or abnormal enhancement. Lymph nodes: No abnormal appearing lymph nodes. Ancillary findings:  None. IMPRESSION: Normal examination. RECOMMENDATION: If the patient has not had any spontaneous bloody nipple discharge, but only when expressed, then a bilateral screening mammogram would be recommended in 5 months when due. If the patient is having spontaneous bloody nipple discharge, then surgical consultation for consideration of duct excision would be recommended. BI-RADS CATEGORY  1: Negative. Electronically Signed   By: Claudie Revering M.D.   On: 08/09/2017 15:59    ASSESSMENT: Iron deficiency anemia, secondary to chronic blood loss.  PLAN:    1. Iron deficiency anemia, secondary to chronic blood loss: Patient's hemoglobin is nearly within normal limits. Her iron stores have improved since May. Previously, the remainder of her  laboratory work is either negative or within normal limits.  Bone marrow biopsy was done on February 15, 2017 did not reveal any significant pathology. She does not require IV Feraheme today. Patient's most recent infusion occurred on May 12, 2017.  Return to clinic in 4 months with repeat laboratory work and further evaluation.  2. Melena: Resolved. Patient has had multiple EGDs, colonoscopies, and capsule endoscopies with no obvious the etiology of her bleeding. Monitor and continue evaluation by GI.  3. Chest pain/shortness of breath: Resolved. Follow-up with cardiology as needed.  4. Hypertension: Patient's blood pressure is elevated today, continue current medications as prescribed.  5.  Shoulder Pain: Secondary to fall. No intervention needed at this time.  Approximately 30 minutes was spent in discussion of which greater than 50% was consultation.    Patient expressed understanding and was in agreement with this plan. She also understands that She can call clinic at any time with any questions, concerns, or complaints.    Lloyd Huger, MD 08/25/17 3:21 PM

## 2017-08-24 ENCOUNTER — Inpatient Hospital Stay: Payer: Medicare Other

## 2017-08-24 ENCOUNTER — Inpatient Hospital Stay: Payer: Medicare Other | Attending: Oncology | Admitting: Oncology

## 2017-08-24 VITALS — BP 167/72 | HR 55 | Temp 97.9°F | Resp 18 | Wt 189.7 lb

## 2017-08-24 DIAGNOSIS — M25511 Pain in right shoulder: Secondary | ICD-10-CM | POA: Insufficient documentation

## 2017-08-24 DIAGNOSIS — I2510 Atherosclerotic heart disease of native coronary artery without angina pectoris: Secondary | ICD-10-CM | POA: Diagnosis not present

## 2017-08-24 DIAGNOSIS — D500 Iron deficiency anemia secondary to blood loss (chronic): Secondary | ICD-10-CM | POA: Diagnosis not present

## 2017-08-24 DIAGNOSIS — E7800 Pure hypercholesterolemia, unspecified: Secondary | ICD-10-CM | POA: Diagnosis not present

## 2017-08-24 DIAGNOSIS — K648 Other hemorrhoids: Secondary | ICD-10-CM | POA: Insufficient documentation

## 2017-08-24 DIAGNOSIS — Z9841 Cataract extraction status, right eye: Secondary | ICD-10-CM | POA: Insufficient documentation

## 2017-08-24 DIAGNOSIS — Z85828 Personal history of other malignant neoplasm of skin: Secondary | ICD-10-CM | POA: Insufficient documentation

## 2017-08-24 DIAGNOSIS — E119 Type 2 diabetes mellitus without complications: Secondary | ICD-10-CM | POA: Diagnosis not present

## 2017-08-24 DIAGNOSIS — Z79899 Other long term (current) drug therapy: Secondary | ICD-10-CM | POA: Diagnosis not present

## 2017-08-24 DIAGNOSIS — Z87891 Personal history of nicotine dependence: Secondary | ICD-10-CM | POA: Insufficient documentation

## 2017-08-24 DIAGNOSIS — Z794 Long term (current) use of insulin: Secondary | ICD-10-CM | POA: Insufficient documentation

## 2017-08-24 DIAGNOSIS — I10 Essential (primary) hypertension: Secondary | ICD-10-CM | POA: Diagnosis not present

## 2017-08-24 DIAGNOSIS — Z955 Presence of coronary angioplasty implant and graft: Secondary | ICD-10-CM | POA: Diagnosis not present

## 2017-08-24 DIAGNOSIS — I350 Nonrheumatic aortic (valve) stenosis: Secondary | ICD-10-CM | POA: Diagnosis not present

## 2017-08-24 LAB — CBC WITH DIFFERENTIAL/PLATELET
Basophils Absolute: 0 10*3/uL (ref 0–0.1)
Basophils Relative: 1 %
Eosinophils Absolute: 0.2 10*3/uL (ref 0–0.7)
Eosinophils Relative: 4 %
HEMATOCRIT: 33.6 % — AB (ref 35.0–47.0)
HEMOGLOBIN: 11.6 g/dL — AB (ref 12.0–16.0)
LYMPHS ABS: 1.1 10*3/uL (ref 1.0–3.6)
Lymphocytes Relative: 21 %
MCH: 30.3 pg (ref 26.0–34.0)
MCHC: 34.5 g/dL (ref 32.0–36.0)
MCV: 87.7 fL (ref 80.0–100.0)
MONOS PCT: 9 %
Monocytes Absolute: 0.5 10*3/uL (ref 0.2–0.9)
NEUTROS ABS: 3.5 10*3/uL (ref 1.4–6.5)
NEUTROS PCT: 65 %
Platelets: 267 10*3/uL (ref 150–440)
RBC: 3.83 MIL/uL (ref 3.80–5.20)
RDW: 13.5 % (ref 11.5–14.5)
WBC: 5.4 10*3/uL (ref 3.6–11.0)

## 2017-08-24 LAB — IRON AND TIBC
Iron: 48 ug/dL (ref 28–170)
Saturation Ratios: 19 % (ref 10.4–31.8)
TIBC: 259 ug/dL (ref 250–450)
UIBC: 211 ug/dL

## 2017-08-24 LAB — FERRITIN: FERRITIN: 90 ng/mL (ref 11–307)

## 2017-08-24 NOTE — Progress Notes (Signed)
Patient is here for follow up  

## 2017-09-13 ENCOUNTER — Other Ambulatory Visit: Payer: Self-pay | Admitting: Cardiology

## 2017-09-14 ENCOUNTER — Encounter
Admission: RE | Disposition: A | Discharge status: Home / Self Care | Payer: Self-pay | Source: Ambulatory Visit | Attending: Cardiology

## 2017-09-14 ENCOUNTER — Ambulatory Visit
Admission: RE | Admit: 2017-09-14 | Discharge: 2017-09-14 | Disposition: A | Discharge status: Home / Self Care | Payer: Medicare Other | Source: Ambulatory Visit | Attending: Cardiology | Admitting: Cardiology

## 2017-09-14 ENCOUNTER — Encounter: Payer: Self-pay | Admitting: *Deleted

## 2017-09-14 DIAGNOSIS — Z87891 Personal history of nicotine dependence: Secondary | ICD-10-CM | POA: Insufficient documentation

## 2017-09-14 DIAGNOSIS — Z8049 Family history of malignant neoplasm of other genital organs: Secondary | ICD-10-CM | POA: Insufficient documentation

## 2017-09-14 DIAGNOSIS — E042 Nontoxic multinodular goiter: Secondary | ICD-10-CM | POA: Insufficient documentation

## 2017-09-14 DIAGNOSIS — Z888 Allergy status to other drugs, medicaments and biological substances status: Secondary | ICD-10-CM | POA: Diagnosis not present

## 2017-09-14 DIAGNOSIS — I2510 Atherosclerotic heart disease of native coronary artery without angina pectoris: Secondary | ICD-10-CM | POA: Diagnosis present

## 2017-09-14 DIAGNOSIS — Z955 Presence of coronary angioplasty implant and graft: Secondary | ICD-10-CM | POA: Diagnosis not present

## 2017-09-14 DIAGNOSIS — Z8711 Personal history of peptic ulcer disease: Secondary | ICD-10-CM | POA: Insufficient documentation

## 2017-09-14 DIAGNOSIS — Z9841 Cataract extraction status, right eye: Secondary | ICD-10-CM | POA: Diagnosis not present

## 2017-09-14 DIAGNOSIS — E785 Hyperlipidemia, unspecified: Secondary | ICD-10-CM | POA: Insufficient documentation

## 2017-09-14 DIAGNOSIS — Z90710 Acquired absence of both cervix and uterus: Secondary | ICD-10-CM | POA: Insufficient documentation

## 2017-09-14 DIAGNOSIS — R002 Palpitations: Secondary | ICD-10-CM | POA: Diagnosis not present

## 2017-09-14 DIAGNOSIS — Z885 Allergy status to narcotic agent status: Secondary | ICD-10-CM | POA: Diagnosis not present

## 2017-09-14 DIAGNOSIS — E119 Type 2 diabetes mellitus without complications: Secondary | ICD-10-CM | POA: Insufficient documentation

## 2017-09-14 DIAGNOSIS — I350 Nonrheumatic aortic (valve) stenosis: Secondary | ICD-10-CM | POA: Diagnosis not present

## 2017-09-14 DIAGNOSIS — Z803 Family history of malignant neoplasm of breast: Secondary | ICD-10-CM | POA: Insufficient documentation

## 2017-09-14 DIAGNOSIS — E7800 Pure hypercholesterolemia, unspecified: Secondary | ICD-10-CM | POA: Insufficient documentation

## 2017-09-14 DIAGNOSIS — Z85828 Personal history of other malignant neoplasm of skin: Secondary | ICD-10-CM | POA: Insufficient documentation

## 2017-09-14 DIAGNOSIS — R296 Repeated falls: Secondary | ICD-10-CM | POA: Diagnosis not present

## 2017-09-14 DIAGNOSIS — Z794 Long term (current) use of insulin: Secondary | ICD-10-CM | POA: Insufficient documentation

## 2017-09-14 DIAGNOSIS — Z79899 Other long term (current) drug therapy: Secondary | ICD-10-CM | POA: Diagnosis not present

## 2017-09-14 DIAGNOSIS — I10 Essential (primary) hypertension: Secondary | ICD-10-CM | POA: Diagnosis not present

## 2017-09-14 DIAGNOSIS — Z8262 Family history of osteoporosis: Secondary | ICD-10-CM | POA: Insufficient documentation

## 2017-09-14 DIAGNOSIS — Z8249 Family history of ischemic heart disease and other diseases of the circulatory system: Secondary | ICD-10-CM | POA: Insufficient documentation

## 2017-09-14 DIAGNOSIS — Z833 Family history of diabetes mellitus: Secondary | ICD-10-CM | POA: Diagnosis not present

## 2017-09-14 DIAGNOSIS — Z880 Allergy status to penicillin: Secondary | ICD-10-CM | POA: Insufficient documentation

## 2017-09-14 DIAGNOSIS — Z882 Allergy status to sulfonamides status: Secondary | ICD-10-CM | POA: Diagnosis not present

## 2017-09-14 DIAGNOSIS — Z823 Family history of stroke: Secondary | ICD-10-CM | POA: Insufficient documentation

## 2017-09-14 DIAGNOSIS — Z9842 Cataract extraction status, left eye: Secondary | ICD-10-CM | POA: Diagnosis not present

## 2017-09-14 HISTORY — PX: CORONARY ANGIOGRAPHY: CATH118303

## 2017-09-14 HISTORY — PX: RIGHT AND LEFT HEART CATH: CATH118262

## 2017-09-14 SURGERY — RIGHT AND LEFT HEART CATH
Anesthesia: Moderate Sedation

## 2017-09-14 MED ORDER — ONDANSETRON HCL 4 MG/2ML IJ SOLN: 4.0000 mg | Freq: Four times a day (QID) | INTRAMUSCULAR | Status: DC | PRN

## 2017-09-14 MED ORDER — SODIUM CHLORIDE 0.9 % IV SOLN: 250.0000 mL | INTRAVENOUS | Status: DC | PRN

## 2017-09-14 MED ORDER — SODIUM CHLORIDE 0.9% FLUSH: 3.0000 mL | Freq: Two times a day (BID) | INTRAVENOUS | Status: DC

## 2017-09-14 MED ORDER — SODIUM CHLORIDE 0.9 % WEIGHT BASED INFUSION
3.0000 mL/kg/h | INTRAVENOUS | Status: AC
  Administered 2017-09-14: 3 mL/kg/h via INTRAVENOUS

## 2017-09-14 MED ORDER — FENTANYL CITRATE (PF) 100 MCG/2ML IJ SOLN
INTRAMUSCULAR | Status: AC
  Filled 2017-09-14: qty 2

## 2017-09-14 MED ORDER — SODIUM CHLORIDE 0.9 % WEIGHT BASED INFUSION: 1.0000 mL/kg/h | INTRAVENOUS | Status: DC

## 2017-09-14 MED ORDER — HEPARIN (PORCINE) IN NACL 2-0.9 UNIT/ML-% IJ SOLN
INTRAMUSCULAR | Status: AC
  Filled 2017-09-14: qty 500

## 2017-09-14 MED ORDER — MIDAZOLAM HCL 2 MG/2ML IJ SOLN
INTRAMUSCULAR | Status: DC | PRN
  Administered 2017-09-14: 1 mg via INTRAVENOUS

## 2017-09-14 MED ORDER — ASPIRIN 81 MG PO CHEW
81.0000 mg | CHEWABLE_TABLET | ORAL | Status: AC
  Administered 2017-09-14: 81 mg via ORAL

## 2017-09-14 MED ORDER — IOPAMIDOL (ISOVUE-300) INJECTION 61%
INTRAVENOUS | Status: DC | PRN
  Administered 2017-09-14: 100 mL via INTRA_ARTERIAL

## 2017-09-14 MED ORDER — FENTANYL CITRATE (PF) 100 MCG/2ML IJ SOLN
INTRAMUSCULAR | Status: DC | PRN
  Administered 2017-09-14: 50 ug via INTRAVENOUS

## 2017-09-14 MED ORDER — MIDAZOLAM HCL 2 MG/2ML IJ SOLN
INTRAMUSCULAR | Status: AC
  Filled 2017-09-14: qty 2

## 2017-09-14 MED ORDER — SODIUM CHLORIDE 0.9% FLUSH: 3.0000 mL | INTRAVENOUS | Status: DC | PRN

## 2017-09-14 MED ORDER — ACETAMINOPHEN 325 MG PO TABS: 650.0000 mg | ORAL_TABLET | ORAL | Status: DC | PRN

## 2017-09-14 MED ORDER — LIDOCAINE HCL (PF) 1 % IJ SOLN
INTRAMUSCULAR | Status: AC
  Filled 2017-09-14: qty 30

## 2017-09-14 MED ORDER — ASPIRIN 81 MG PO CHEW
CHEWABLE_TABLET | ORAL | Status: AC
  Filled 2017-09-14: qty 1

## 2017-09-14 MED ORDER — LIDOCAINE HCL (PF) 1 % IJ SOLN
INTRAMUSCULAR | Status: DC | PRN
  Administered 2017-09-14: 20 mL via INTRADERMAL

## 2017-09-14 SURGICAL SUPPLY — 18 items
CABLE ADAPT CONN TEMP 6FT (ADAPTER) IMPLANT
CATH INFINITI 5 FR 3DRC (CATHETERS) ×3 IMPLANT
CATH INFINITI 5FR ANG PIGTAIL (CATHETERS) ×3 IMPLANT
CATH INFINITI 5FR JL4 (CATHETERS) ×3 IMPLANT
CATH INFINITI JR4 5F (CATHETERS) ×3 IMPLANT
CATH SWANZ 7F THERMO (CATHETERS) ×3 IMPLANT
DEVICE CLOSURE MYNXGRIP 6/7F (Vascular Products) ×3 IMPLANT
GUIDEWIRE 3MM J TIP .035 145 (WIRE) ×3 IMPLANT
GUIDEWIRE EMER 3M J .025X150CM (WIRE) ×3 IMPLANT
KIT MANI 3VAL PERCEP (MISCELLANEOUS) ×3 IMPLANT
KIT RIGHT HEART (MISCELLANEOUS) ×3 IMPLANT
NEEDLE PERC 18GX7CM (NEEDLE) ×3 IMPLANT
PACK CARDIAC CATH (CUSTOM PROCEDURE TRAY) ×3 IMPLANT
SHEATH AVANTI 5FR X 11CM (SHEATH) IMPLANT
SHEATH AVANTI 6FR X 11CM (SHEATH) ×3 IMPLANT
SHEATH PINNACLE 7F 10CM (SHEATH) ×3 IMPLANT
SLEEVE REPOSITIONING LENGTH 30 (MISCELLANEOUS) IMPLANT
WIRE EMERALD ST .035X150CM (WIRE) ×3 IMPLANT

## 2017-09-14 NOTE — H&P (Signed)
Chief Complaint: Chief Complaint  Patient presents with  . Follow-up  had an Echo  . Hypertension  today at eye Dr bp was elevated at 183/70  . Fall  has fallen 3 different times dizziness and lost balance and I have pain over eye around side of neck and down my back  Date of Service: 09/05/2017 Date of Birth: April 08, 1946 PCP: Lovie Macadamia, MD  History of Present Illness: [name] is a 71 y.o.female patient who has a history of aortic stenosis .Marland Kitchen She has been evaluated with a left cardiac catheterization. This revealed small-vessel disease with preserved LV function. Her aortic valve did not appear critical at the time of cath. Recent echocardiogram revealed normal LV function with a peak gradient across the aortic valve of 44.5 with a mean gradient of 20.5. Valve area was estimated at 0.78 cm by continuity equation. Her symptoms are more notable. She also has hypertension especially when she is at a physician's office. She has had several falls due to loss of balance. Past Medical and Surgical History  Past Medical History Past Medical History:  Diagnosis Date  . Aortic stenosis, moderate  . Basal cell carcinoma  . CAD (coronary artery disease)  3.0by 18 mm vision stent in the mid circumflex  . Cataract cortical, senile  . DM2 (diabetes mellitus, type 2) (CMS-HCC)  . Encounter for blood transfusion  . HTN (hypertension)  . Hyperlipidemia, unspecified, unspecified  . Iron deficiency anemia  a. hospitalization and 2U PRBC transfusion 10/2012  . Multinodular goiter  a. biopsy of nodule 06/2012 was benign  . Palpitations  . PUD (peptic ulcer disease)  History of a bleeding ulcer   Past Surgical History She has a past surgical history that includes capsule endoscopy (N/A, 05/09/2013); esophagogastrodoudenoscopy w/biopsy (05/09/2013); Breast excisional biopsy; Cataract extraction (Bilateral); Incision Tendon Sheath For Trigger Finger (Bilateral); Coronary angioplasty; Stress test;  Echocardiogram; colonoscopy (12/04/2012 (Inpt)); egd (04/04/2013); Basal cell removed; Trigger finger bilateral thumb; Carotid endarterectomy (Left, 07/2015); egd (05/31/2016 (Inpt)); and Hysterectomy.   Medications and Allergies  Current Medications  Current Outpatient Prescriptions  Medication Sig Dispense Refill  . albuterol 90 mcg/actuation inhaler Inhale 2 inhalations into the lungs every 4 (four) hours as needed. 200 inhalation 2  . alendronate (FOSAMAX) 70 MG tablet Take 1 tablet (70 mg total) by mouth every 7 (seven) days. Take with a full glass of water. Do not lie down for the next 30 min. 12 tablet 3  . ascorbic acid (VITAMIN C) 500 MG tablet Take 500 mg by mouth once daily.  Marland Kitchen BIOTIN ORAL Take 1 tablet by mouth once daily.  . collagen, bovine, 100 % Powd Take by mouth. Mix with coffee daily  . FLAXSEED OIL ORAL Take 1 capsule by mouth once daily.  Marland Kitchen gabapentin (NEURONTIN) 300 MG capsule Take 1 capsule (300 mg total) by mouth nightly. 90 capsule 3  . insulin NPH-REGULAR (NOVOLIN 70/30 U-100 INSULIN) 100 unit/mL (70-30) injection Take 56 units before breakfast and 32 units before supper. 90 mL 3  . losartan (COZAAR) 100 MG tablet Take 0.5 tablets (50 mg total) by mouth once daily. 90 tablet 1  . metoprolol tartrate (LOPRESSOR) 100 MG tablet Take 1 tablet (100 mg total) by mouth 2 (two) times daily. 90 tablet 3  . multivitamin with iron/minerals (THERA-M,THERADEX-M) 27-0.4 mg tablet Take 1 tablet by mouth daily. 30 tablet 0  . omeprazole (PRILOSEC) 40 MG DR capsule Take 1 capsule (40 mg total) by mouth once daily. [place]  capsule 3  . pravastatin (PRAVACHOL) 10 MG tablet Take 1 tablet (10 mg total) by mouth nightly. 30 tablet 5  . sertraline (ZOLOFT) 50 MG tablet May titrate by 1/2 pill q 2-3 weeks up to 100 mg (Patient taking differently: Take 100 mg by mouth once daily.  ) 60 tablet 11  . traMADol (ULTRAM) 50 mg tablet Take 1 tablet (50 mg total) by mouth 4 (four) times daily as needed  for Pain for up to 12 doses. 12 tablet 0  . TURMERIC ROOT EXTRACT ORAL Take by mouth.  . blood glucose diagnostic test strip Use 2 (two) times daily. Use as instructed. 200 each 11  . insulin syringe-needle U-100 (INSULIN SYRINGE) 1 mL 30 gauge x 5/16 Syrg USE ONE TWICE DAILY WITH INSULIN 200 each 11   No current facility-administered medications for this visit.   Allergies: Invokana [canagliflozin]; Lovastatin; Penicillins; Sulfa (sulfonamide antibiotics); and Vicodin [hydrocodone-acetaminophen]  Social and Family History  Social History reports that she quit smoking about 32 years ago. Her smoking use included Cigarettes. She has never used smokeless tobacco. She reports that she does not drink alcohol or use drugs.  Family History Family History  Problem Relation Age of Onset  . Coronary Artery Disease (Blocked arteries around heart) Mother  . Diabetes type II Mother  . High blood pressure (Hypertension) Mother  . Osteoporosis (Thinning of bones) Mother  . Breast cancer Mother  . Uterine cancer Mother  . Coronary Artery Disease (Blocked arteries around heart) Father  . High blood pressure (Hypertension) Father  . Osteoporosis (Thinning of bones) Father  . Stroke Father  . Diabetes type II Father  . Diabetes type II Sister  . Cervical cancer Sister  . High blood pressure (Hypertension) Brother   Review of Systems  Review of Systems  Constitutional: Positive for malaise/fatigue. Negative for chills, diaphoresis, fever and weight loss.  HENT: Negative for congestion, ear discharge and tinnitus.  Eyes: Negative for blurred vision.  Respiratory: Positive for shortness of breath. Negative for cough, hemoptysis, sputum production and wheezing.  Cardiovascular: Positive for chest pain. Negative for orthopnea, claudication, leg swelling and PND.  Gastrointestinal: Negative for blood in stool, constipation, diarrhea, melena and vomiting.  Genitourinary: Negative for dysuria,  frequency, hematuria and urgency.  Musculoskeletal: Positive for back pain and myalgias. Negative for falls and joint pain.  Skin: Negative for itching and rash.  Neurological: Positive for weakness. Negative for dizziness, tingling, focal weakness and loss of consciousness.  Endo/Heme/Allergies: Negative for polydipsia. Does not bruise/bleed easily.  Psychiatric/Behavioral: Negative for depression, memory loss and substance abuse. The patient has insomnia. The patient is not nervous/anxious.    Physical Examination   Vitals: BP (!) 190/80  Pulse 72  Resp 12  Ht 160 cm (5\' 3" )  Wt 84.6 kg (186 lb 6.4 oz)  BMI 33.02 kg/m  Ht:160 cm (5\' 3" ) Wt:84.6 kg (186 lb 6.4 oz) ZOX:WRUE surface area is 1.94 meters squared. Body mass index is 33.02 kg/m.  Wt Readings from Last 3 Encounters:  09/05/17 84.6 kg (186 lb 6.4 oz)  07/29/17 84.4 kg (186 lb)  07/26/17 83 kg (183 lb)   BP Readings from Last 3 Encounters:  09/05/17 (!) 190/80  07/29/17 130/84  07/26/17 120/64  general: Female in no acute distress  LUNGS Breath Sounds: Normal Percussion: Normal  CARDIOVASCULAR JVP CV wave: no HJR: no Elevation at 90 degrees: None Carotid Pulse: normal pulsation bilaterally Bruit: None Apex: apical impulse normal  Auscultation Rhythm: normal sinus  rhythm S1: normal S2: normal Clicks: no Rub: no Murmurs: 3/6 medium pitched crescendo-decrescendo harsh at lower left sternal border, at upper left sternal border, at upper right sternal border  Gallop: None ABDOMEN Liver enlargement: no Pulsatile aorta: no Ascites: no Bruits: no  EXTREMITIES Clubbing: no Edema: trace to 1+ bilateral pedal edema Pulses: peripheral pulses symmetrical Femoral Bruits: no Amputation: no SKIN Rash: no Cyanosis: no Embolic phemonenon: no Bruising: no NEURO Alert and Oriented to person, place and time: yes Non focal: yes LABS Last 3 CBC results: Lab Results  Component Value Date  WBC 5.6  09/05/2017  WBC 3.9 (L) 07/26/2017  WBC 4.9 06/16/2017   Lab Results  Component Value Date  HGB 13.3 09/05/2017  HGB 11.8 (L) 07/26/2017  HGB 11.5 (L) 06/16/2017   Lab Results  Component Value Date  HCT 40.5 09/05/2017  HCT 35.3 07/26/2017  HCT 34.2 (L) 06/16/2017   Lab Results  Component Value Date  PLT 270 09/05/2017  PLT 245 07/26/2017  PLT 222 06/16/2017   Lab Results  Component Value Date  CREATININE 1.0 09/05/2017  BUN 35 (H) 09/05/2017  NA 137 09/05/2017  K 4.5 09/05/2017  CL 98 09/05/2017  CO2 32.3 (H) 09/05/2017   Lab Results  Component Value Date  HGBA1C 7.6 (H) 06/24/2017   Lab Results  Component Value Date  ALT 16 06/14/2016  AST 17 06/14/2016  ALKPHOS 71 06/14/2016     Assessment and Plan   71 y.o. female with  ICD-10-CM ICD-9-CM  1. Coronary artery disease involving native coronary artery of native heart- Aortic valve is noncritical. However patient's symptoms are progressing. Functional study showed some ischemia in the inferior distribution. Cardiac catheterization in September 2017 did not show any significant progression of coronary disease. Medical management was recommended. Has had further symptoms and echo shows a valve area of 0.78. We will proceed with right left heart cath to better evaluate this to guide further therapy. I25.10 414.01  2. Pure hypercholesterolemia-Will continue with low-fat diet. LDL goal of less than 100 is recommended. E78.0 272.0  3. Essential hypertension-blood pressure is controlled with metoprolol and losartan. Will continue with metoprolol 150 mg twice daily. DASH diet is also recommended I10 401.9  4. Type 2 diabetes mellitus with other circulatory complications-patient will remain on insulin with an hemoglobin A1c goal of less than 6. Most recent was 7.6.Marland Kitchen She and her husband are walking daily and eating a much better diet. Strict adherence to ADA diet is recommended E11.59 250.70  5. Palpitations-continue with  metoprolol and avoiding stimulant. Will place holter to evaluate extent of dysrhythmia. Will also check electrolytes. R00.2 785.1  6. Aortic stenosis, moderate to severe-as per above. Proceed with right left heart cath to evaluate for possible significant change it would require surgical intervention. I35.0 424.1   Return in about 2 weeks (around 09/19/2017).  These notes generated with voice recognition software. I apologize for typographical errors.  Sydnee Levans, MD    Pt seen and examined. No change from above.

## 2017-09-14 NOTE — Discharge Instructions (Signed)
Angiogram An angiogram is an X-ray test. It is used to look at your blood vessels. For this test, a dye is put into the blood vessel being checked. The dye shows up on X-rays. It helps your doctor see if there is a blockage or other problem in the blood vessel. What happens before the procedure?  Follow your doctor's instructions about limiting what you eat or drink.  Ask your doctor if you may drink enough water to take any needed medicines the morning of the test.  Plan to have someone take you home after the test.  If you go home the same day as the test, plan to have someone stay with you for 24 hours. What happens during the procedure?  An IV tube will be put into one of your veins.  You will be given a medicine that makes you relax (sedative).  Your skin will be washed where the thin tube (catheter) will be put in. Hair may be removed from this area. The tube may be put into: ? Your upper leg area (groin). ? The fold of your arm, near your elbow. ? Your wrist.  You will be given a medicine that numbs the area where the tube will be inserted (local anesthetic).  The tube will be inserted into a blood vessel.  Using a type of X-ray (fluoroscopy) to see, your doctor will move the tube into the blood vessel to check it.  Dye will be put in through the tube. X-rays of your blood vessels will then be taken. Different doctors and hospitals may do this procedure differently. What happens after the procedure?  If the test is done through the leg, you will be kept in bed lying flat for several hours. You will be told to not bend or cross your legs.  The area where the tube was inserted will be checked often.  The pulse in your feet or wrist will be checked often.  More tests or X-rays may be done. This information is not intended to replace advice given to you by your health care provider. Make sure you discuss any questions you have with your health care provider. Document  Released: 03/11/2009 Document Revised: 05/20/2016 Document Reviewed: 05/16/2013 Elsevier Interactive Patient Education  2017 [place]. Moderate Conscious Sedation, Adult, Care After These instructions provide you with information about caring for yourself after your procedure. Your health care provider may also give you more specific instructions. Your treatment has been planned according to current medical practices, but problems sometimes occur. Call your health care provider if you have any problems or questions after your procedure. What can I expect after the procedure? After your procedure, it is common:  To feel sleepy for several hours.  To feel clumsy and have poor balance for several hours.  To have poor judgment for several hours.  To vomit if you eat too soon.  Follow these instructions at home: For at least 24 hours after the procedure:   Do not: ? Participate in activities where you could fall or become injured. ? Drive. ? Use heavy machinery. ? Drink alcohol. ? Take sleeping pills or medicines that cause drowsiness. ? Make important decisions or sign legal documents. ? Take care of children on your own.  Rest. Eating and drinking  Follow the diet recommended by your health care provider.  If you vomit: ? Drink water, juice, or soup when you can drink without vomiting. ? Make sure you have little or no nausea before  eating solid foods. General instructions  Have a responsible adult stay with you until you are awake and alert.  Take over-the-counter and prescription medicines only as told by your health care provider.  If you smoke, do not smoke without supervision.  Keep all follow-up visits as told by your health care provider. This is important. Contact a health care provider if:  You keep feeling nauseous or you keep vomiting.  You feel light-headed.  You develop a rash.  You have a fever. Get help right away if:  You have trouble  breathing. This information is not intended to replace advice given to you by your health care provider. Make sure you discuss any questions you have with your health care provider. Document Released: 10/03/2013 Document Revised: 05/17/2016 Document Reviewed: 04/03/2016 Elsevier Interactive Patient Education  [employer].

## 2017-09-14 NOTE — Progress Notes (Signed)
Pt sitting up in bed tolerating PO intake at this time, VSS.  Dr. Ubaldo Glassing at bedside earlier to discuss procedure with pt and family.  Discharge and follow up instructions reviewed with pt and family.  Return precautions reviewed with pt and family.  Pt and family verbalize understanding of topics reviewed.

## 2017-12-09 ENCOUNTER — Other Ambulatory Visit: Payer: Self-pay | Admitting: General Surgery

## 2017-12-09 DIAGNOSIS — N6452 Nipple discharge: Secondary | ICD-10-CM

## 2017-12-24 NOTE — Progress Notes (Signed)
[facility]  Telephone:([phone]) [phone] [phone]) [phone]  ID: Joyce Potter OB: 11-Jan-1946  MR#: [id number]  VPX#:[id number]  Patient Care Team: [place] Pink, MD as PCP - General (Family Medicine) Manya Silvas, MD (Gastroenterology)  CHIEF COMPLAINT: Iron deficiency anemia, secondary to chronic blood loss.  INTERVAL HISTORY: Patient returns to clinic today for further evaluation, laboratory work, and consideration of additional IV iron. She had a fall yesterday and hit her head, but did not seek medical attention.  Today she is complaining of persistent headache and mild visual changes.  She otherwise feels well. She does not complain of weakness or fatigue. She has no other neurologic complaints. She denies any recent fevers. She has had intentional weight loss since changing her diet. She has no cough or hemoptysis. She denies any nausea, vomiting, constipation, or diarrhea. She denies melena. She has no urinary complaints. Patient offers no further specific complaints today.   REVIEW OF SYSTEMS:   Review of Systems  Constitutional: Negative for fever, malaise/fatigue and weight loss.  Eyes: Positive for blurred vision.  Respiratory: Negative for cough, hemoptysis and shortness of breath.   Cardiovascular: Negative for chest pain and leg swelling.  Gastrointestinal: Negative for abdominal pain, blood in stool and melena.  Genitourinary: Negative for hematuria.  Musculoskeletal: Positive for joint pain.  Skin: Negative.  Negative for rash.  Neurological: Positive for headaches. Negative for dizziness and weakness.  Psychiatric/Behavioral: The patient is nervous/anxious.     As per HPI. Otherwise, a complete review of systems is negative.  PAST MEDICAL HISTORY: Past Medical History:  Diagnosis Date  . Anemia    iron deficiency  . Aortic stenosis   . Basal cell carcinoma   . CAD (coronary artery disease)    s/p Left circumflex stent in 2012  .  Carotid stenosis   . Cataract   . High cholesterol   . HTN (hypertension)   . Hyperlipidemia   . IDDM (insulin dependent diabetes mellitus) ([place])   . Multiple thyroid nodules    Benign  . Peptic ulcer disease   . Retinal artery occlusion    on left    PAST SURGICAL HISTORY: Past Surgical History:  Procedure Laterality Date  . ARTERY BIOPSY Left 11/19/2015   Procedure: BIOPSY TEMPORAL ARTERY;  Surgeon: Algernon Huxley, MD;  Location: [facility];  Service: Vascular;  Laterality: Left;  . BREAST BIOPSY Left    core- neg  . CARDIAC CATHETERIZATION N/A 08/08/2015   Procedure: Right and Left Heart Cath and Coronary Angiography;  Surgeon: Teodoro Spray, MD;  Location: [place] [facility];  Service: Cardiovascular;  Laterality: N/A;  . CARDIAC CATHETERIZATION Bilateral 09/03/2016   Procedure: Right/Left Heart Cath and Coronary Angiography;  Surgeon: Teodoro Spray, MD;  Location: [place] [facility];  Service: Cardiovascular;  Laterality: Bilateral;  . CATARACT EXTRACTION W/ INTRAOCULAR LENS  IMPLANT, BILATERAL    . COLONOSCOPY     in 2013- internal hemorrhoids  . CORONARY ANGIOGRAPHY N/A 09/14/2017   Procedure: CORONARY ANGIOGRAPHY;  Surgeon: Teodoro Spray, MD;  Location: [place] [facility];  Service: Cardiovascular;  Laterality: N/A;  . CORONARY ANGIOPLASTY    . CORONARY STENT PLACEMENT    . ENDARTERECTOMY Left 10/01/2015   Procedure: ENDARTERECTOMY CAROTID;  Surgeon: Algernon Huxley, MD;  Location: [facility];  Service: Vascular;  Laterality: Left;  . ESOPHAGOGASTRODUODENOSCOPY    . ESOPHAGOGASTRODUODENOSCOPY (EGD) WITH PROPOFOL N/A 05/31/2016   Procedure: ESOPHAGOGASTRODUODENOSCOPY (EGD) WITH PROPOFOL;  Surgeon: Gavin Pound  Vira Agar, MD;  Location: [place];  Service: Endoscopy;  Laterality: N/A;  . EYE SURGERY    . GIVENS CAPSULE STUDY N/A 04/17/2013   Procedure: GIVENS CAPSULE STUDY;  Surgeon: Arta Silence, MD;  Location: [facility];  Service: Endoscopy;  Laterality: N/A;  patient ate  breakfast at 7am   . PARTIAL HYSTERECTOMY    . RIGHT AND LEFT HEART CATH Bilateral 09/14/2017   Procedure: RIGHT AND LEFT HEART CATH;  Surgeon: Teodoro Spray, MD;  Location: [place] [facility];  Service: Cardiovascular;  Laterality: Bilateral;  . TRIGGER FINGER RELEASE      FAMILY HISTORY Family History  Problem Relation Age of Onset  . Uterine cancer Mother   . Breast cancer Mother 18  . Seizures Father   . Stroke Father   . Diabetes Father   . COPD Father   . Colon cancer Neg Hx        ADVANCED DIRECTIVES:    HEALTH MAINTENANCE: Social History   Tobacco Use  . Smoking status: Former Smoker    Last attempt to quit: 02/15/1985    Years since quitting: 32.8  . Smokeless tobacco: Never Used  . Tobacco comment: quit about 31 years ago  Substance Use Topics  . Alcohol use: No    Alcohol/week: 0.0 oz  . Drug use: No     Colonoscopy:  PAP:  Bone density:  Lipid panel:  Allergies  Allergen Reactions  . Aspirin Other (See Comments)    ulcers  . Invokamet [Canagliflozin-Metformin Hcl] Other (See Comments)    Yeast infection  . Sulfa Antibiotics Other (See Comments)    "Got Drunk"  . Lovastatin Rash  . Penicillins Rash    Has patient had a PCN reaction causing immediate rash, facial/tongue/throat swelling, SOB or lightheadedness with hypotension:Yes Has patient had a PCN reaction causing severe rash involving mucus membranes or skin necrosis:all over body Has patient had a PCN reaction that required hospitalization: No Has patient had a PCN reaction occurring within the last 10 years: No If all of the above answers are "NO", then may proceed with Cephalosporin use.   . Vicodin [Hydrocodone-Acetaminophen] Rash    Current Outpatient Medications  Medication Sig Dispense Refill  . albuterol (PROAIR HFA) 108 (90 Base) MCG/ACT inhaler Inhale 2 puffs into the lungs every 4 (four) hours as needed. For shortness of breath/wheezing.    Marland Kitchen alendronate (FOSAMAX) 70 MG  tablet Take 70 mg by mouth every Tuesday.     . Ascorbic Acid (VITAMIN C) 1000 MG tablet Take 1,000 mg by mouth daily.    . Biotin (RA BIOTIN) 1000 MCG tablet Take 1,000 mcg by mouth daily.    . COLLAGEN PO Take 15 mLs by mouth daily with breakfast. Mix with coffee    . Emollient (CERAVE) CREA Apply 1 application topically daily.    . Flaxseed, Linseed, (FLAXSEED OIL) 1000 MG CAPS Take 1,200 mg by mouth daily.     Marland Kitchen gabapentin (NEURONTIN) 300 MG capsule Take 300 mg by mouth at bedtime.     Marland Kitchen GARLIC PO Take 335 mg by mouth daily with breakfast.     . insulin NPH-regular Human (NOVOLIN 70/30) (70-30) 100 UNIT/ML injection Inject 27-50 Units into the skin 2 (two) times daily. 50 units in the morning, & 27 units in the afternoon.    Marland Kitchen losartan (COZAAR) 100 MG tablet Take 100 mg by mouth daily with breakfast.     . metoprolol (LOPRESSOR) 100 MG tablet Take 100 mg  by mouth 2 (two) times daily.    . Multiple Vitamins-Iron (MULTIVITAMINS WITH IRON) TABS tablet Take 1 tablet by mouth daily.    . Omega-3 Fatty Acids (FISH OIL) 500 MG CAPS Take 500 mg by mouth daily.    Marland Kitchen omeprazole (PRILOSEC) 40 MG capsule Take 40 mg by mouth daily before breakfast.     . OVER THE COUNTER MEDICATION Apply 1 application topically at bedtime. REVITALIFT    . pravastatin (PRAVACHOL) 10 MG tablet Take 1 tablet (10 mg total) by mouth daily at 6 PM. (Patient taking differently: Take 10 mg by mouth at bedtime. ) 30 tablet 0  . sertraline (ZOLOFT) 100 MG tablet Take 100 mg by mouth at bedtime.     . traMADol (ULTRAM) 50 MG tablet Take 50 mg by mouth every 6 (six) hours as needed (for pain.).    Marland Kitchen TURMERIC PO Take 1,000 mg by mouth daily.     No current facility-administered medications for this visit.     OBJECTIVE: Vitals:   12/26/17 1400  BP: (!) 159/88  Pulse: 92  Resp: 18  Temp: 98.6 F (37 C)     Body mass index is 37.26 kg/m.    ECOG FS:1 - Symptomatic but completely ambulatory  General: Well-developed,  well-nourished, no acute distress. Eyes: Pink conjunctiva, anicteric sclera. Lungs: Clear to auscultation bilaterally. Heart: Regular rate and rhythm. No rubs, murmurs, or gallops. Abdomen: Soft, nontender, nondistended. No organomegaly noted, normoactive bowel sounds. Musculoskeletal: No edema, cyanosis, or clubbing. Neuro: Alert, answering all questions appropriately. Cranial nerves grossly intact. Skin: No rashes or petechiae noted. Psych: Normal affect.   LAB RESULTS:  Lab Results  Component Value Date   NA 139 11/28/2016   K 4.4 11/28/2016   CL 108 11/28/2016   CO2 25 11/28/2016   GLUCOSE 152 (H) 11/28/2016   BUN 25 (H) 11/28/2016   CREATININE 0.82 12/26/2017   CALCIUM 9.0 11/28/2016   PROT 6.8 11/28/2016   ALBUMIN 3.8 11/28/2016   AST 19 11/28/2016   ALT 17 11/28/2016   ALKPHOS 73 11/28/2016   BILITOT 0.4 11/28/2016   GFRNONAA >60 12/26/2017   GFRAA >60 12/26/2017    Lab Results  Component Value Date   WBC 4.1 12/26/2017   NEUTROABS 2.3 12/26/2017   HGB 12.4 12/26/2017   HCT 37.4 12/26/2017   MCV 88.8 12/26/2017   PLT 231 12/26/2017   Lab Results  Component Value Date   IRON 57 12/26/2017   TIBC 277 12/26/2017   IRONPCTSAT 21 12/26/2017    Lab Results  Component Value Date   FERRITIN 57 12/26/2017     STUDIES: No results found.  ASSESSMENT: Iron deficiency anemia, secondary to chronic blood loss.  PLAN:    1. Iron deficiency anemia, secondary to chronic blood loss: Patient's hemoglobin and iron stores are within normal limits. Previously, the remainder of her laboratory work is either negative or within normal limits.  Bone marrow biopsy was done on February 15, 2017 did not reveal any significant pathology. She does not require IV Feraheme today. Patient's most recent infusion occurred on May 12, 2017.  Return to clinic in 4 months with repeat laboratory work and further evaluation.  2. Melena: Resolved. Patient has had multiple EGDs,  colonoscopies, and capsule endoscopies with no obvious the etiology of her bleeding. Monitor and continue evaluation by GI.  3. Chest pain/shortness of breath: Resolved. Follow-up with cardiology as needed.  4. Hypertension: Patient's blood pressure is elevated today, continue current medications  as prescribed.  5. Shoulder Pain: Secondary to fall. No intervention needed at this time. 6.  Possible head injury: Patient states she had a fall yesterday hitting her head and now is complaining of blurry vision and headaches.  We will get a stat head CT for further evaluation.  Approximately 30 minutes was spent in discussion of which greater than 50% was consultation.    Patient expressed understanding and was in agreement with this plan. She also understands that She can call clinic at any time with any questions, concerns, or complaints.    Lloyd Huger, MD 12/26/17 3:30 PM

## 2017-12-26 ENCOUNTER — Inpatient Hospital Stay: Payer: Medicare Other | Attending: Oncology

## 2017-12-26 ENCOUNTER — Inpatient Hospital Stay (HOSPITAL_BASED_OUTPATIENT_CLINIC_OR_DEPARTMENT_OTHER): Payer: Medicare Other | Admitting: Oncology

## 2017-12-26 ENCOUNTER — Ambulatory Visit
Admission: RE | Admit: 2017-12-26 | Discharge: 2017-12-26 | Disposition: A | Discharge status: Home / Self Care | Payer: Medicare Other | Source: Ambulatory Visit | Attending: Oncology | Admitting: Oncology

## 2017-12-26 ENCOUNTER — Inpatient Hospital Stay: Payer: Medicare Other

## 2017-12-26 VITALS — BP 159/88 | HR 92 | Temp 98.6°F | Resp 18 | Wt 190.8 lb

## 2017-12-26 DIAGNOSIS — N6452 Nipple discharge: Secondary | ICD-10-CM

## 2017-12-26 DIAGNOSIS — M25519 Pain in unspecified shoulder: Secondary | ICD-10-CM | POA: Diagnosis not present

## 2017-12-26 DIAGNOSIS — E7800 Pure hypercholesterolemia, unspecified: Secondary | ICD-10-CM | POA: Diagnosis not present

## 2017-12-26 DIAGNOSIS — Z8041 Family history of malignant neoplasm of ovary: Secondary | ICD-10-CM | POA: Insufficient documentation

## 2017-12-26 DIAGNOSIS — Z803 Family history of malignant neoplasm of breast: Secondary | ICD-10-CM | POA: Insufficient documentation

## 2017-12-26 DIAGNOSIS — I2510 Atherosclerotic heart disease of native coronary artery without angina pectoris: Secondary | ICD-10-CM | POA: Diagnosis not present

## 2017-12-26 DIAGNOSIS — D500 Iron deficiency anemia secondary to blood loss (chronic): Secondary | ICD-10-CM | POA: Diagnosis not present

## 2017-12-26 DIAGNOSIS — I350 Nonrheumatic aortic (valve) stenosis: Secondary | ICD-10-CM | POA: Diagnosis not present

## 2017-12-26 DIAGNOSIS — E042 Nontoxic multinodular goiter: Secondary | ICD-10-CM

## 2017-12-26 DIAGNOSIS — Z8711 Personal history of peptic ulcer disease: Secondary | ICD-10-CM | POA: Insufficient documentation

## 2017-12-26 DIAGNOSIS — G319 Degenerative disease of nervous system, unspecified: Secondary | ICD-10-CM | POA: Insufficient documentation

## 2017-12-26 DIAGNOSIS — Z87891 Personal history of nicotine dependence: Secondary | ICD-10-CM | POA: Diagnosis not present

## 2017-12-26 DIAGNOSIS — Z85820 Personal history of malignant melanoma of skin: Secondary | ICD-10-CM | POA: Diagnosis not present

## 2017-12-26 DIAGNOSIS — E119 Type 2 diabetes mellitus without complications: Secondary | ICD-10-CM | POA: Insufficient documentation

## 2017-12-26 DIAGNOSIS — R51 Headache: Secondary | ICD-10-CM | POA: Insufficient documentation

## 2017-12-26 DIAGNOSIS — I6782 Cerebral ischemia: Secondary | ICD-10-CM | POA: Diagnosis not present

## 2017-12-26 DIAGNOSIS — I6529 Occlusion and stenosis of unspecified carotid artery: Secondary | ICD-10-CM | POA: Insufficient documentation

## 2017-12-26 DIAGNOSIS — H538 Other visual disturbances: Secondary | ICD-10-CM

## 2017-12-26 DIAGNOSIS — Z79899 Other long term (current) drug therapy: Secondary | ICD-10-CM | POA: Diagnosis not present

## 2017-12-26 DIAGNOSIS — E785 Hyperlipidemia, unspecified: Secondary | ICD-10-CM | POA: Diagnosis not present

## 2017-12-26 DIAGNOSIS — I10 Essential (primary) hypertension: Secondary | ICD-10-CM | POA: Insufficient documentation

## 2017-12-26 DIAGNOSIS — Z794 Long term (current) use of insulin: Secondary | ICD-10-CM | POA: Insufficient documentation

## 2017-12-26 LAB — CREATININE, SERUM
Creatinine, Ser: 0.82 mg/dL (ref 0.44–1.00)
GFR calc non Af Amer: >60 mL/min (ref >60)

## 2017-12-26 LAB — IRON AND TIBC
IRON: 57 ug/dL (ref 28–170)
Saturation Ratios: 21 % (ref 10.4–31.8)
TIBC: 277 ug/dL (ref 250–450)
UIBC: 221 ug/dL

## 2017-12-26 LAB — CBC WITH DIFFERENTIAL/PLATELET
Basophils Absolute: 0 10*3/uL (ref 0–0.1)
Basophils Relative: 1 %
EOS PCT: 5 %
Eosinophils Absolute: 0.2 10*3/uL (ref 0–0.7)
HEMATOCRIT: 37.4 % (ref 35.0–47.0)
Hemoglobin: 12.4 g/dL (ref 12.0–16.0)
LYMPHS PCT: 28 %
Lymphs Abs: 1.1 10*3/uL (ref 1.0–3.6)
MCH: 29.3 pg (ref 26.0–34.0)
MCHC: 33 g/dL (ref 32.0–36.0)
MCV: 88.8 fL (ref 80.0–100.0)
MONO ABS: 0.4 10*3/uL (ref 0.2–0.9)
MONOS PCT: 9 %
NEUTROS ABS: 2.3 10*3/uL (ref 1.4–6.5)
Neutrophils Relative %: 57 %
PLATELETS: 231 10*3/uL (ref 150–440)
RBC: 4.22 MIL/uL (ref 3.80–5.20)
RDW: 12.9 % (ref 11.5–14.5)
WBC: 4.1 10*3/uL (ref 3.6–11.0)

## 2017-12-26 LAB — FERRITIN: FERRITIN: 57 ng/mL (ref 11–307)

## 2017-12-26 MED ORDER — IOPAMIDOL (ISOVUE-300) INJECTION 61%
75.0000 mL | Freq: Once | INTRAVENOUS | Status: AC | PRN
  Administered 2017-12-26: 75 mL via INTRAVENOUS

## 2017-12-26 NOTE — Progress Notes (Signed)
Patient presents today for follow up of Iron Deficiency Anemia. She reports she fell and hit her head yesterday and is now having blurred vision. Ordering a STAT CT of the head per [place].

## 2018-01-04 ENCOUNTER — Ambulatory Visit
Admission: RE | Admit: 2018-01-04 | Discharge: 2018-01-04 | Disposition: A | Discharge status: Home / Self Care | Payer: Medicare Other | Source: Ambulatory Visit | Attending: General Surgery | Admitting: General Surgery

## 2018-01-04 ENCOUNTER — Ambulatory Visit: Admission: RE | Admit: 2018-01-04 | Payer: Medicare Other | Source: Ambulatory Visit

## 2018-01-04 DIAGNOSIS — N6452 Nipple discharge: Secondary | ICD-10-CM | POA: Diagnosis not present

## 2018-02-14 ENCOUNTER — Ambulatory Visit (INDEPENDENT_AMBULATORY_CARE_PROVIDER_SITE_OTHER): Payer: Medicare Other

## 2018-02-14 ENCOUNTER — Ambulatory Visit (INDEPENDENT_AMBULATORY_CARE_PROVIDER_SITE_OTHER): Payer: Medicare Other | Admitting: Vascular Surgery

## 2018-02-14 ENCOUNTER — Encounter (INDEPENDENT_AMBULATORY_CARE_PROVIDER_SITE_OTHER): Payer: Self-pay | Admitting: Vascular Surgery

## 2018-02-14 VITALS — BP 182/112 | HR 66 | Resp 15 | Ht 63.0 in | Wt 194.0 lb

## 2018-02-14 DIAGNOSIS — I6523 Occlusion and stenosis of bilateral carotid arteries: Secondary | ICD-10-CM | POA: Diagnosis not present

## 2018-02-14 DIAGNOSIS — I739 Peripheral vascular disease, unspecified: Secondary | ICD-10-CM | POA: Diagnosis not present

## 2018-02-14 DIAGNOSIS — I10 Essential (primary) hypertension: Secondary | ICD-10-CM | POA: Diagnosis not present

## 2018-02-14 NOTE — Progress Notes (Signed)
MRN : [id number]  Joyce Potter is a 72 y.o. ([birth year]) female who presents with chief complaint of  Chief Complaint  Patient presents with  . Follow-up    Carotid and Lower arterial u/s  .  History of Present Illness: Patient returns in follow-up of multiple vascular issues. She is doing well without specific complaints today. She is not having any ischemic rest pain, lifestyle limiting claudication, or ulcerations of her lower extremities. Her noninvasive studies today continue to show good flow distally with normal ABIs and waveforms. She denies any focal neurologic symptoms. Specifically, the patient denies amaurosis fugax, speech or swallowing difficulties, or arm or leg weakness or numbness. Carotid duplex today reveals a widely patent left carotid endarterectomy without evidence of restenosis and stable 1-39% right internal carotid artery stenosis.    Current Outpatient Prescriptions  Medication Sig Dispense Refill  . albuterol (PROAIR HFA) 108 (90 Base) MCG/ACT inhaler Inhale 2 puffs into the lungs every 4 (four) hours as needed. For shortness of breath/wheezing.    Marland Kitchen ascorbic acid (VITAMIN C) 500 MG tablet Take 500 mg by mouth daily.    . Biotin (RA BIOTIN) 1000 MCG tablet Take 1,000 mcg by mouth daily.    . Black Pepper-Turmeric (TURMERIC COMPLEX/BLACK PEPPER PO) Take 1 capsule by mouth daily.    . Flaxseed, Linseed, (FLAXSEED OIL) 1000 MG CAPS Take 1,000 mg by mouth daily.    Marland Kitchen gabapentin (NEURONTIN) 300 MG capsule Take 1 capsule by mouth at bedtime.    Marland Kitchen GARLIC PO Take 1 capsule by mouth daily.    . insulin NPH-regular Human (NOVOLIN 70/30) (70-30) 100 UNIT/ML injection Inject 50-56 Units into the skin See admin instructions. Inject 56 units subcutaneous every morning and Inject 50 units subcutaneous every day in the evening.    Marland Kitchen losartan (COZAAR) 100 MG tablet Take 100 mg by mouth daily.    . metoprolol (LOPRESSOR) 100 MG tablet Take 100 mg by mouth  2 (two) times daily.    . Multiple Vitamins-Minerals (MULTIVITAMIN WITH MINERALS) tablet Take 1 tablet by mouth daily.    Marland Kitchen omeprazole (PRILOSEC) 40 MG capsule Take 40 mg by mouth daily.    . pravastatin (PRAVACHOL) 10 MG tablet Take 1 tablet (10 mg total) by mouth daily at 6 PM. 30 tablet 0  . sertraline (ZOLOFT) 100 MG tablet Take 100 mg by mouth at bedtime as needed.    . traMADol (ULTRAM) 50 MG tablet Take 1 tablet (50 mg total) by mouth every 6 (six) hours as needed for moderate pain. 30 tablet 0   No current facility-administered medications for this visit.         Past Medical History:  Diagnosis Date  . Anemia    iron deficiency  . Aortic stenosis   . Basal cell carcinoma   . CAD (coronary artery disease)    s/p Left circumflex stent in 2012  . Carotid stenosis   . Cataract   . High cholesterol   . HTN (hypertension)   . Hyperlipidemia   . IDDM (insulin dependent diabetes mellitus) ([place])   . Multiple thyroid nodules    Benign  . Peptic ulcer disease   . Retinal artery occlusion    on left         Past Surgical History:  Procedure Laterality Date  . ARTERY BIOPSY Left 11/19/2015   Procedure: BIOPSY TEMPORAL ARTERY;  Surgeon: Algernon Huxley, MD;  Location: [facility] ORS;  Service: Vascular;  Laterality: Left;  .  BREAST BIOPSY Left    core- neg  . CARDIAC CATHETERIZATION N/A 08/08/2015   Procedure: Right and Left Heart Cath and Coronary Angiography;  Surgeon: Teodoro Spray, MD;  Location: [place] CV LAB;  Service: Cardiovascular;  Laterality: N/A;  . CARDIAC CATHETERIZATION Bilateral 09/03/2016   Procedure: Right/Left Heart Cath and Coronary Angiography;  Surgeon: Teodoro Spray, MD;  Location: [place] [facility];  Service: Cardiovascular;  Laterality: Bilateral;  . CATARACT EXTRACTION W/ INTRAOCULAR LENS  IMPLANT, BILATERAL    . COLONOSCOPY     in 2013- internal hemorrhoids  . CORONARY ANGIOPLASTY    . CORONARY STENT  PLACEMENT    . ENDARTERECTOMY Left 10/01/2015   Procedure: ENDARTERECTOMY CAROTID;  Surgeon: Algernon Huxley, MD;  Location: [facility] ORS;  Service: Vascular;  Laterality: Left;  . ESOPHAGOGASTRODUODENOSCOPY    . ESOPHAGOGASTRODUODENOSCOPY (EGD) WITH PROPOFOL N/A 05/31/2016   Procedure: ESOPHAGOGASTRODUODENOSCOPY (EGD) WITH PROPOFOL;  Surgeon: Manya Silvas, MD;  Location: [facility] ENDOSCOPY;  Service: Endoscopy;  Laterality: N/A;  . EYE SURGERY    . GIVENS CAPSULE STUDY N/A 04/17/2013   Procedure: GIVENS CAPSULE STUDY;  Surgeon: Arta Silence, MD;  Location: [facility] ENDOSCOPY;  Service: Endoscopy;  Laterality: N/A;  patient ate breakfast at 7am   . PARTIAL HYSTERECTOMY    . TRIGGER FINGER RELEASE      Social History       Social History  Substance Use Topics  . Smoking status: Former Research scientist (life sciences)  . Smokeless tobacco: Never Used     Comment: quit about 31 years ago  . Alcohol use No     Family History      Family History  Problem Relation Age of Onset  . Uterine cancer Mother   . Breast cancer Mother 50  . Seizures Father   . Stroke Father   . Diabetes Father   . COPD Father   . Colon cancer Neg Hx          Allergies  Allergen Reactions  . Aspirin Other (See Comments)    ulcers  . Invokamet [Canagliflozin-Metformin Hcl] Other (See Comments)    Yeast infection  . Sulfa Antibiotics Other (See Comments)    "Got Drunk"  . Lovastatin Rash  . Penicillins Rash  . Vicodin [Hydrocodone-Acetaminophen] Rash     REVIEW OF SYSTEMS (Negative unless checked)  Constitutional: [] Weight loss  [] Fever  [] Chills Cardiac: [] Chest pain   [] Chest pressure   [] Palpitations   [] Shortness of breath when laying flat   [] Shortness of breath at rest   [] Shortness of breath with exertion. Vascular:  [] Pain in legs with walking   [] Pain in legs at rest   [] Pain in legs when laying flat   [] Claudication   [] Pain in feet when walking  [] Pain in feet at rest  [] Pain in feet  when laying flat   [] History of DVT   [] Phlebitis   [] Swelling in legs   [] Varicose veins   [] Non-healing ulcers Pulmonary:   [] Uses home oxygen   [] Productive cough   [] Hemoptysis   [] Wheeze  [] COPD   [] Asthma Neurologic:  [] Dizziness  [] Blackouts   [] Seizures   [] History of stroke   [] History of TIA  [] Aphasia   [] Temporary blindness   [] Dysphagia   [] Weakness or numbness in arms   [] Weakness or numbness in legs Musculoskeletal:  [x] Arthritis   [] Joint swelling   [] Joint pain   [] Low back pain Hematologic:  [] Easy bruising  [] Easy bleeding   [] Hypercoagulable state   [] Anemic  []   Hepatitis Gastrointestinal:  [] Blood in stool   [] Vomiting blood  [] Gastroesophageal reflux/heartburn   [] Difficulty swallowing. Genitourinary:  [] Chronic kidney disease   [] Difficult urination  [] Frequent urination  [] Burning with urination   [] Blood in urine Skin:  [] Rashes   [] Ulcers   [] Wounds Psychological:  [] History of anxiety   []  History of major depression.      Physical Examination  Vitals:   02/14/18 1047 02/14/18 1049  BP: (!) 156/77 (!) 182/112  Pulse: 66   Resp: 15   Weight: 88 kg (194 lb)   Height: 5\' 3"  (1.6 m)    Body mass index is 34.37 kg/m. Gen:  WD/WN, NAD Head: [place]/AT, No temporalis wasting. Ear/Nose/Throat: Hearing grossly intact, nares w/o erythema or drainage, trachea midline Eyes: Conjunctiva clear. Sclera non-icteric Neck: Supple.  Bilateral bruits that are likely radiation of her heart murmur Pulmonary:  Good air movement, equal and clear to auscultation bilaterally.  Cardiac: RRR, loud systolic murmur which radiates into the neck Vascular:  Vessel Right Left  Radial Palpable Palpable                          PT Palpable Palpable  DP Palpable Palpable    Musculoskeletal: M/S 5/5 throughout.  No deformity or atrophy. No edema. Neurologic: CN 2-12 intact. Sensation grossly intact in extremities.  Symmetrical.  Speech is fluent. Motor exam as listed  above. Psychiatric: Judgment intact, Mood & affect appropriate for pt's clinical situation. Dermatologic: No rashes or ulcers noted.  No cellulitis or open wounds.      CBC Lab Results  Component Value Date   WBC 4.1 12/26/2017   HGB 12.4 12/26/2017   HCT 37.4 12/26/2017   MCV 88.8 12/26/2017   PLT 231 12/26/2017    BMET    Component Value Date/Time   NA 139 11/28/2016 1534   NA 143 05/04/2013 0614   K 4.4 11/28/2016 1534   K 3.8 05/04/2013 0614   CL 108 11/28/2016 1534   CL 110 (H) 05/04/2013 0614   CO2 25 11/28/2016 1534   CO2 27 05/04/2013 0614   GLUCOSE 152 (H) 11/28/2016 1534   GLUCOSE 130 (H) 05/04/2013 0614   BUN 25 (H) 11/28/2016 1534   BUN 12 05/04/2013 0614   CREATININE 0.82 12/26/2017 1319   CREATININE 0.79 08/27/2014 1445   CALCIUM 9.0 11/28/2016 1534   CALCIUM 8.2 (L) 05/04/2013 0614   GFRNONAA >60 12/26/2017 1319   GFRNONAA >60 08/27/2014 1445   GFRAA >60 12/26/2017 1319   GFRAA >60 08/27/2014 1445   CrCl cannot be calculated (Patient's most recent lab result is older than the maximum 21 days allowed.).  COAG Lab Results  Component Value Date   INR 0.98 02/15/2017   INR 1.02 11/28/2016   INR 1.11 11/19/2015    Radiology No results found.    Assessment/Plan HTN (hypertension) blood pressure control important in reducing the progression of atherosclerotic disease. On appropriate oral medications.   PVD (peripheral vascular disease) ([place]) The patient has a history of peripheral vascular disease with calcification but maintained flow distally. Her noninvasive studies today continue to show good flow distally with normal ABIs and waveforms. We can recheck her ABIs in 1-2 years with noninvasive studies. Continue aspirin and statin agent.  Carotid stenosis Carotid duplex today reveals a widely patent left carotid endarterectomy without evidence of restenosis and stable 1-39% right internal carotid artery stenosis. Continue aspirin and  statin agent. Recheck in 1 year.  Leotis Pain, MD  02/14/2018 12:17 PM    This note was created with Dragon medical transcription system.  Any errors from dictation are purely unintentional

## 2018-04-03 DIAGNOSIS — E113393 Type 2 diabetes mellitus with moderate nonproliferative diabetic retinopathy without macular edema, bilateral: Secondary | ICD-10-CM | POA: Diagnosis not present

## 2018-04-11 DIAGNOSIS — N6452 Nipple discharge: Secondary | ICD-10-CM | POA: Diagnosis not present

## 2018-04-12 DIAGNOSIS — E1159 Type 2 diabetes mellitus with other circulatory complications: Secondary | ICD-10-CM | POA: Diagnosis not present

## 2018-04-13 ENCOUNTER — Ambulatory Visit: Payer: Medicare HMO | Attending: Student | Admitting: Physical Therapy

## 2018-04-13 ENCOUNTER — Encounter: Payer: Self-pay | Admitting: Physical Therapy

## 2018-04-13 ENCOUNTER — Other Ambulatory Visit: Payer: Self-pay

## 2018-04-13 DIAGNOSIS — M6281 Muscle weakness (generalized): Secondary | ICD-10-CM | POA: Diagnosis not present

## 2018-04-13 DIAGNOSIS — R262 Difficulty in walking, not elsewhere classified: Secondary | ICD-10-CM | POA: Insufficient documentation

## 2018-04-13 NOTE — Therapy (Signed)
[place] MAIN [facility] SERVICES [address] [place], [place], [address] Phone: [phone]   Fax:  [phone]  Physical Therapy Evaluation  Patient Details  Name: Joyce Potter MRN: [id number] Date of Birth: 1946-09-12 Referring Provider: Wayland Denis, PA-C   Encounter Date: 04/13/2018  PT End of Session - 04/13/18 0815    Visit Number  1    Number of Visits  17    Date for PT Re-Evaluation  06/08/18    PT Start Time  0800    PT Stop Time  0900    PT Time Calculation (min)  60 min    Equipment Utilized During Treatment  Gait belt    Activity Tolerance  Patient tolerated treatment well    Behavior During Therapy  [facility] for tasks assessed/performed       Past Medical History:  Diagnosis Date  . Anemia    iron deficiency  . Aortic stenosis   . Basal cell carcinoma   . CAD (coronary artery disease)    s/p Left circumflex stent in 2012  . Carotid stenosis   . Cataract   . High cholesterol   . HTN (hypertension)   . Hyperlipidemia   . IDDM (insulin dependent diabetes mellitus) ([place])   . Multiple thyroid nodules    Benign  . Peptic ulcer disease   . Retinal artery occlusion    on left    Past Surgical History:  Procedure Laterality Date  . ARTERY BIOPSY Left 11/19/2015   Procedure: BIOPSY TEMPORAL ARTERY;  Surgeon: Algernon Huxley, MD;  Location: [facility] ORS;  Service: Vascular;  Laterality: Left;  . BREAST BIOPSY Left 2002   core- neg  . CARDIAC CATHETERIZATION N/A 08/08/2015   Procedure: Right and Left Heart Cath and Coronary Angiography;  Surgeon: Teodoro Spray, MD;  Location: [place] CV LAB;  Service: Cardiovascular;  Laterality: N/A;  . CARDIAC CATHETERIZATION Bilateral 09/03/2016   Procedure: Right/Left Heart Cath and Coronary Angiography;  Surgeon: Teodoro Spray, MD;  Location: [place] CV LAB;  Service: Cardiovascular;  Laterality: Bilateral;  . CATARACT EXTRACTION W/ INTRAOCULAR LENS  IMPLANT, BILATERAL    .  COLONOSCOPY     in 2013- internal hemorrhoids  . CORONARY ANGIOGRAPHY N/A 09/14/2017   Procedure: CORONARY ANGIOGRAPHY;  Surgeon: Teodoro Spray, MD;  Location: [place] CV LAB;  Service: Cardiovascular;  Laterality: N/A;  . CORONARY ANGIOPLASTY    . CORONARY STENT PLACEMENT    . ENDARTERECTOMY Left 10/01/2015   Procedure: ENDARTERECTOMY CAROTID;  Surgeon: Algernon Huxley, MD;  Location: [facility] ORS;  Service: Vascular;  Laterality: Left;  . ESOPHAGOGASTRODUODENOSCOPY    . ESOPHAGOGASTRODUODENOSCOPY (EGD) WITH PROPOFOL N/A 05/31/2016   Procedure: ESOPHAGOGASTRODUODENOSCOPY (EGD) WITH PROPOFOL;  Surgeon: Manya Silvas, MD;  Location: [facility] ENDOSCOPY;  Service: Endoscopy;  Laterality: N/A;  . EYE SURGERY    . GIVENS CAPSULE STUDY N/A 04/17/2013   Procedure: GIVENS CAPSULE STUDY;  Surgeon: Arta Silence, MD;  Location: [facility] ENDOSCOPY;  Service: Endoscopy;  Laterality: N/A;  patient ate breakfast at 7am   . PARTIAL HYSTERECTOMY    . RIGHT AND LEFT HEART CATH Bilateral 09/14/2017   Procedure: RIGHT AND LEFT HEART CATH;  Surgeon: Teodoro Spray, MD;  Location: [place] CV LAB;  Service: Cardiovascular;  Laterality: Bilateral;  . TRIGGER FINGER RELEASE      There were no vitals filed for this visit.   Subjective Assessment - 04/13/18 0809    Subjective  Patient  says that her knees give away on her and she has fallen 6 times in the last 6 months.     Pertinent History  Patient has arthritis and 4-5 months ago she began taking arthritis medicine. Patient has had 6 falls , some while walking and some when she is getting up.  She has joint narrowing in B knees. She does not use an assistive device. Patient had a ischemic stroke 4 years ago and it left a blood clot behind her eye and  she only has peripheral vision, no central vision. She ha PT for a bad disk 4 years ago. She has had injuries after her falls; sprained wrists, sore shoulders. back pain.     Limitations  Walking;Standing    How long can  you sit comfortably?  unlimited    How long can you stand comfortably?  15 min    How long can you walk comfortably?  15 mins    Diagnostic tests  x rays    Patient Stated Goals  Patient wants to get stronger and stop falling.     Currently in Pain?  Yes    Pain Score  5     Pain Location  Knee    Pain Orientation  Right;Left    Pain Descriptors / Indicators  Aching    Pain Type  Chronic pain    Pain Radiating Towards  na    Pain Onset  More than a month ago    Pain Frequency  Constant    Aggravating Factors   standing, house work    Pain Relieving Factors  pain meds take the edge off    Effect of Pain on Daily Activities  gradually getting worse    Multiple Pain Sites  No         [facility] PT Assessment - 04/13/18 0816      Assessment   Medical Diagnosis  unsteady gait    Referring Provider  Wayland Denis, PA-C    Onset Date/Surgical Date  03/21/18    Hand Dominance  Right    Prior Therapy  none      Precautions   Precautions  Fall      Restrictions   Weight Bearing Restrictions  No      Balance Screen   Has the patient fallen in the past 6 months  Yes    How many times?  6    Has the patient had a decrease in activity level because of a fear of falling?   Yes    Is the patient reluctant to leave their home because of a fear of falling?   No      Home Environment   Living Environment  Private residence    Living Arrangements  Spouse/significant other    Available Help at Discharge  Family    Type of [place] to enter    Entrance Stairs-Number of Steps  9    [place] reach both;Right;Left    [place]  One level    [place]  None      Prior Function   Level of Independence  Independent with basic ADLs;Independent with household mobility without device;Independent with community mobility without device    Vocation  Retired    Leisure  fishing, walking,         POSTURE:  WNL   PROM/AROM:WFL  STRENGTH:  Graded on a 0-5 scale Muscle Group  Left Right                          Hip Flex 3/5 3/5  Hip Abd 3/5 3/5  Hip Add 2/5 2/5  Hip Ext 2/5 2/5  Hip IR/ER NT NT  Knee Flex 4/5 4/5  Knee Ext 4/5 4/5  Ankle DF 3+/5 3+/5  Ankle PF 3+/5 3+/5   SENSATION: numbness and tingling in  Bilateral ankles and toes, neuropahty   FUNCTIONAL MOBILITY: independent with bed mobility  Transfers with definite use of UE for safety  Static Standing Balance  Normal Able to maintain standing balance against maximal resistance   Good Able to maintain standing balance against moderate resistance   Good-/Fair+ Able to maintain standing balance against minimal resistance x  Fair Able to stand unsupported without UE support and without LOB for 1-2 min   Fair- Requires Min A and UE support to maintain standing without loss of balance   Poor+ Requires mod A and UE support to maintain standing without loss of balance   Poor Requires max A and UE support to maintain standing balance without loss    Standing Dynamic Balance  Normal Stand independently unsupported, able to weight shift and cross midline maximally   Good Stand independently unsupported, able to weight shift and cross midline moderately   Good-/Fair+ Stand independently unsupported, able to weight shift across midline minimally x  Fair Stand independently unsupported, weight shift, and reach ipsilaterally, loss of balance when crossing midline   Poor+ Able to stand with Min A and reach ipsilaterally, unable to weight shift   Poor Able to stand with Mod A and minimally reach ipsilaterally, unable to cross midline.     Unable to tandem stand or single leg stand    GAIT:Patient has impaired gait with narrow base of support, crossing LE's when fatigued, shuffling feet after 200 feet of ambulation and path deviations  OUTCOME MEASURES: TEST Outcome Interpretation  5 times sit<>stand 25.32sec >61 yo, >15 sec  indicates increased risk for falls  10 meter walk test    .72             m/s <1.0 m/s indicates increased risk for falls; limited community ambulator  Timed up and Go    17.37             sec <14 sec indicates increased risk for falls  6 minute walk test      900          Feet 1000 feet is community ambulator  LEFSt 38/80 80/80 is normal       Treatment: Sidelying hip abd x 15 BLE Prone hip extension x 15 x 1 BLE  Standing heel raises BLE x 10 Patient needs cues for correct form and technique          Objective measurements completed on examination: See above findings.              PT Education - 04/13/18 0815    Education provided  Yes    Education Details  plan of care    Person(s) Educated  Patient    Methods  Explanation    Comprehension  Verbalized understanding       PT Short Term Goals - 04/13/18 0924      PT SHORT TERM GOAL #1   Title  Patient will be independent in home exercise program to improve strength/mobility for better functional independence with ADLs.  Time  4    Period  Weeks    Status  New    Target Date  05/11/18      PT SHORT TERM GOAL #2   Title  Patient (> 64 years old) will complete five times sit to stand test in < 15 seconds indicating an increased LE strength and improved balance.    Time  4    Period  Weeks    Status  New    Target Date  05/11/18        PT Long Term Goals - 04/13/18 0925      PT LONG TERM GOAL #1   Title  Patient will increase six minute walk test distance to >1000 for progression to community ambulator and improve gait ability    Time  8    Period  Weeks    Status  New    Target Date  06/08/18      PT LONG TERM GOAL #2   Title  Patient will increase 10 meter walk test to >1.51m/s as to improve gait speed for better community ambulation and to reduce fall risk.    Time  8    Period  Weeks    Status  New    Target Date  06/08/18      PT LONG TERM GOAL #3   Title  Patient will reduce timed up  and go to <11 seconds to reduce fall risk and demonstrate improved transfer/gait ability.    Time  8    Period  Weeks    Status  New    Target Date  06/08/18      PT LONG TERM GOAL #4   Title  Patient will be require no assist with ascend/descend 3 steps using Least restrictive assistive device.    Time  8    Period  Weeks    Status  New    Target Date  06/08/18      PT LONG TERM GOAL #5   Title  Patient will ascend/descend 4 stairs without rail assist independently without loss of balance to improve ability to get in/out of home.     Time  8    Period  Weeks    Status  New    Target Date  06/08/18             Plan - 04/13/18 0855    Clinical Impression Statement  Patient presenst with weakness and recent falls. She has unsteady gait that gets more impaired with increased distances. She reports B knee pain 5/10 that is constant.  She ambulates without a assistive device with shuffling feet with fatigue for longer distances. She has decreased outcome measures that indicate a falls risk. She has BLE hip and ankle weakness. She has impaired dynamic standing balance and static standing balance. She will benefit from skilled PT to improve strength and mobility and decrease her falls risk.     Clinical Presentation  Evolving    Clinical Presentation due to:  falls, condition is evolving and changing    Clinical Decision Making  Moderate    Rehab Potential  Good    PT Frequency  2x / week    PT Duration  8 weeks    PT Treatment/Interventions  Manual techniques;Patient/family education;Balance training;Neuromuscular re-education;Functional mobility training;Therapeutic activities;Therapeutic exercise;Stair training;Gait training;Aquatic Therapy    PT Next Visit Plan  strengthening and balance    PT Home Exercise Plan  heel raises, sidelying hip abd, prone hip  ext with hand out    Consulted and Agree with Plan of Care  Patient       Patient will benefit from skilled therapeutic  intervention in order to improve the following deficits and impairments:  Abnormal gait, Decreased balance, Decreased endurance, Decreased mobility, Difficulty walking, Impaired sensation, Decreased range of motion, Pain, Decreased strength, Decreased safety awareness, Decreased activity tolerance  Visit Diagnosis: Muscle weakness (generalized)  Difficulty in walking, not elsewhere classified     Problem List Patient Active Problem List   Diagnosis Date Noted  . PVD (peripheral vascular disease) ([place]) 02/11/2017  . Iron deficiency anemia due to chronic blood loss 07/13/2016  . Central retinal artery occlusion 10/01/2015  . Carotid stenosis 10/01/2015  . Stroke ([place]) 09/28/2015  . GI bleed from an occult source with recurrent chronic blood loss anemia 04/14/2013  . CAD (coronary artery disease) with demand ischemia from anemia 04/14/2013  . IDDM (insulin dependent diabetes mellitus) ([place]) 04/14/2013  . HTN (hypertension) 04/14/2013  . Chest pain 04/14/2013    Alanson Puls, PT DPT 04/13/2018, 9:31 AM  [place] MAIN [facility] SERVICES [address] [place], [place], [address] Phone: [phone]   Fax:  [phone]  Name: NARCISSA MELDER MRN: [id number] Date of Birth: 04-03-1946

## 2018-04-13 NOTE — Patient Instructions (Signed)
Hip Extension (Prone)    Lift left leg ___4_ inches from floor, keeping knee locked. Repeat _10___ times per set. Do _2___ sets per session. Do ___5_ sessions per day.  http://orth.exer.us/99   Copyright  VHI. All rights reserved.  Hip Abduction: Modified    Lying on right side with pillow between thighs, raise top leg from pillow, rotating slightly out. Repeat _15___ times per set. Do _2___ sets per session. Do ___5_ sessions per day.  http://orth.exer.us/705   Copyright  VHI. All rights reserved.  Heel Raises    Stand with support. Tighten pelvic floor and hold. With knees straight, raise heels off ground. Hold ___ seconds. Relax for ___ seconds. Repeat _10__ times. Do _2__ times a day.  Copyright  VHI. All rights reserved.

## 2018-04-19 ENCOUNTER — Ambulatory Visit: Payer: Medicare HMO | Admitting: Physical Therapy

## 2018-04-19 DIAGNOSIS — Z794 Long term (current) use of insulin: Secondary | ICD-10-CM | POA: Diagnosis not present

## 2018-04-19 DIAGNOSIS — M810 Age-related osteoporosis without current pathological fracture: Secondary | ICD-10-CM | POA: Diagnosis not present

## 2018-04-19 DIAGNOSIS — E1142 Type 2 diabetes mellitus with diabetic polyneuropathy: Secondary | ICD-10-CM | POA: Diagnosis not present

## 2018-04-19 DIAGNOSIS — E1159 Type 2 diabetes mellitus with other circulatory complications: Secondary | ICD-10-CM | POA: Diagnosis not present

## 2018-04-19 DIAGNOSIS — E1165 Type 2 diabetes mellitus with hyperglycemia: Secondary | ICD-10-CM | POA: Diagnosis not present

## 2018-04-19 DIAGNOSIS — E1129 Type 2 diabetes mellitus with other diabetic kidney complication: Secondary | ICD-10-CM | POA: Diagnosis not present

## 2018-04-19 DIAGNOSIS — R809 Proteinuria, unspecified: Secondary | ICD-10-CM | POA: Diagnosis not present

## 2018-04-20 ENCOUNTER — Inpatient Hospital Stay: Payer: Medicare HMO | Attending: Oncology

## 2018-04-20 ENCOUNTER — Inpatient Hospital Stay: Payer: Medicare HMO

## 2018-04-20 ENCOUNTER — Other Ambulatory Visit: Payer: Self-pay | Admitting: *Deleted

## 2018-04-20 ENCOUNTER — Encounter: Payer: Self-pay | Admitting: Oncology

## 2018-04-20 ENCOUNTER — Inpatient Hospital Stay (HOSPITAL_BASED_OUTPATIENT_CLINIC_OR_DEPARTMENT_OTHER): Payer: Medicare HMO | Admitting: Oncology

## 2018-04-20 VITALS — BP 147/84 | HR 96 | Temp 98.3°F | Resp 20 | Wt 189.6 lb

## 2018-04-20 DIAGNOSIS — R5383 Other fatigue: Secondary | ICD-10-CM | POA: Diagnosis not present

## 2018-04-20 DIAGNOSIS — R0602 Shortness of breath: Secondary | ICD-10-CM | POA: Diagnosis not present

## 2018-04-20 DIAGNOSIS — R531 Weakness: Secondary | ICD-10-CM

## 2018-04-20 DIAGNOSIS — D500 Iron deficiency anemia secondary to blood loss (chronic): Secondary | ICD-10-CM | POA: Diagnosis not present

## 2018-04-20 LAB — CBC WITH DIFFERENTIAL/PLATELET
BASOS PCT: 1 %
Basophils Absolute: 0 10*3/uL (ref 0–0.1)
EOS PCT: 3 %
Eosinophils Absolute: 0.1 10*3/uL (ref 0–0.7)
HEMATOCRIT: 34.7 % — AB (ref 35.0–47.0)
HEMOGLOBIN: 11.4 g/dL — AB (ref 12.0–16.0)
LYMPHS PCT: 22 %
Lymphs Abs: 0.9 10*3/uL — ABNORMAL LOW (ref 1.0–3.6)
MCH: 28.3 pg (ref 26.0–34.0)
MCHC: 32.9 g/dL (ref 32.0–36.0)
MCV: 86.1 fL (ref 80.0–100.0)
Monocytes Absolute: 0.6 10*3/uL (ref 0.2–0.9)
Monocytes Relative: 14 %
Neutro Abs: 2.6 10*3/uL (ref 1.4–6.5)
Neutrophils Relative %: 60 %
Platelets: 216 10*3/uL (ref 150–440)
RBC: 4.03 MIL/uL (ref 3.80–5.20)
RDW: 13.8 % (ref 11.5–14.5)
WBC: 4.3 10*3/uL (ref 3.6–11.0)

## 2018-04-20 LAB — IRON AND TIBC
Iron: 18 ug/dL — ABNORMAL LOW (ref 28–170)
Saturation Ratios: 6 % — ABNORMAL LOW (ref 10.4–31.8)
TIBC: 296 ug/dL (ref 250–450)
UIBC: 278 ug/dL

## 2018-04-20 LAB — FERRITIN: FERRITIN: 39 ng/mL (ref 11–307)

## 2018-04-20 MED ORDER — SODIUM CHLORIDE 0.9 % IV SOLN
510.0000 mg | Freq: Once | INTRAVENOUS | Status: AC
  Administered 2018-04-20: 510 mg via INTRAVENOUS
  Filled 2018-04-20: qty 17

## 2018-04-20 NOTE — Progress Notes (Signed)
Symptom Management Consult note [facility]  Telephone:([phone] [phone]  Patient Care Team: [place] Pink, MD as PCP - General (Family Medicine) Manya Silvas, MD (Gastroenterology)   Name of the patient: Joyce Potter  [id number]  1946/02/02   Date of visit: 04/21/18  Diagnosis-Iron deficiency anemia d/t chronic blood loss  Chief complaint/ Reason for visit- Weakness/Fatigue  Heme/Onc history: Patient last seen by primary hematologist Dr. Grayland Ormond on 12/26/2017 for consideration of additional IV iron.  At that visit she had recently fallen and hit her head but unfortunately did not seek medical attention.  She complained of persistent headaches and mild visual changes but otherwise felt well. She did not require IV iron based on her labs.  Work-up for iron deficiency including a bone marrow biopsy was completed in 5 years and unrevealing of significant pathology.  Had complete GI work-up with no revealing findings from her EGD, colonoscopy and capsule endoscopies.   She did have a stat CT of her head to rule out trauma.  CT of head ruled out acute intracranial abnormality.  Interval history-  Joyce Potter is a 72 y.o. female who presents for evaluation of fatigue. Symptoms began several days ago. The patient feels the fatigue began with: low iron levels. Symptoms of her fatigue have been general malaise and palpatations. Patient describes the following psychological symptoms: none. Patient denies fever and GI blood loss. Symptoms have gradually worsened. Symptom severity: moderate. Previous visits for this problem: yes, last seen 1 year ago by Dr. Grayland Ormond where she received one dose of IV iron.  ECOG FS:1 - Symptomatic but completely ambulatory  Review of systems- Review of Systems  Constitutional: Positive for malaise/fatigue. Negative for chills, fever and weight loss.  HENT: Negative for congestion and ear pain.   Eyes:  Negative.  Negative for blurred vision and double vision.  Respiratory: Positive for shortness of breath. Negative for cough and sputum production.   Cardiovascular: Positive for palpitations. Negative for chest pain and leg swelling.  Gastrointestinal: Negative.  Negative for abdominal pain, constipation, diarrhea, nausea and vomiting.  Genitourinary: Negative for dysuria, frequency and urgency.  Musculoskeletal: Negative for back pain and falls.  Skin: Negative.  Negative for rash.  Neurological: Positive for weakness. Negative for headaches.  Endo/Heme/Allergies: Negative.  Does not bruise/bleed easily.  Psychiatric/Behavioral: Negative.  Negative for depression. The patient is not nervous/anxious and does not have insomnia.      Current treatment- IV Feraheme PRN  Allergies  Allergen Reactions  . Aspirin Other (See Comments)    ulcers  . Invokamet [Canagliflozin-Metformin Hcl] Other (See Comments)    Yeast infection  . Sulfa Antibiotics Other (See Comments)    "Got Drunk"  . Lovastatin Rash  . Penicillins Rash    Has patient had a PCN reaction causing immediate rash, facial/tongue/throat swelling, SOB or lightheadedness with hypotension:Yes Has patient had a PCN reaction causing severe rash involving mucus membranes or skin necrosis:all over body Has patient had a PCN reaction that required hospitalization: No Has patient had a PCN reaction occurring within the last 10 years: No If all of the above answers are "NO", then may proceed with Cephalosporin use.   . Vicodin [Hydrocodone-Acetaminophen] Rash     Past Medical History:  Diagnosis Date  . Anemia    iron deficiency  . Aortic stenosis   . Basal cell carcinoma   . CAD (coronary artery disease)    s/p Left circumflex stent in 2012  .  Carotid stenosis   . Cataract   . High cholesterol   . HTN (hypertension)   . Hyperlipidemia   . IDDM (insulin dependent diabetes mellitus) ([place])   . Multiple thyroid nodules     Benign  . Peptic ulcer disease   . Retinal artery occlusion    on left     Past Surgical History:  Procedure Laterality Date  . ARTERY BIOPSY Left 11/19/2015   Procedure: BIOPSY TEMPORAL ARTERY;  Surgeon: Algernon Huxley, MD;  Location: [facility] ORS;  Service: Vascular;  Laterality: Left;  . BREAST BIOPSY Left 2002   core- neg  . CARDIAC CATHETERIZATION N/A 08/08/2015   Procedure: Right and Left Heart Cath and Coronary Angiography;  Surgeon: Teodoro Spray, MD;  Location: [place] [facility];  Service: Cardiovascular;  Laterality: N/A;  . CARDIAC CATHETERIZATION Bilateral 09/03/2016   Procedure: Right/Left Heart Cath and Coronary Angiography;  Surgeon: Teodoro Spray, MD;  Location: [place] [facility];  Service: Cardiovascular;  Laterality: Bilateral;  . CATARACT EXTRACTION W/ INTRAOCULAR LENS  IMPLANT, BILATERAL    . COLONOSCOPY     in 2013- internal hemorrhoids  . CORONARY ANGIOGRAPHY N/A 09/14/2017   Procedure: CORONARY ANGIOGRAPHY;  Surgeon: Teodoro Spray, MD;  Location: [place] [facility];  Service: Cardiovascular;  Laterality: N/A;  . CORONARY ANGIOPLASTY    . CORONARY STENT PLACEMENT    . ENDARTERECTOMY Left 10/01/2015   Procedure: ENDARTERECTOMY CAROTID;  Surgeon: Algernon Huxley, MD;  Location: [facility] ORS;  Service: Vascular;  Laterality: Left;  . ESOPHAGOGASTRODUODENOSCOPY    . ESOPHAGOGASTRODUODENOSCOPY (EGD) WITH PROPOFOL N/A 05/31/2016   Procedure: ESOPHAGOGASTRODUODENOSCOPY (EGD) WITH PROPOFOL;  Surgeon: Manya Silvas, MD;  Location: [facility] ENDOSCOPY;  Service: Endoscopy;  Laterality: N/A;  . EYE SURGERY    . GIVENS CAPSULE STUDY N/A 04/17/2013   Procedure: GIVENS CAPSULE STUDY;  Surgeon: Arta Silence, MD;  Location: [facility] ENDOSCOPY;  Service: Endoscopy;  Laterality: N/A;  patient ate breakfast at 7am   . PARTIAL HYSTERECTOMY    . RIGHT AND LEFT HEART CATH Bilateral 09/14/2017   Procedure: RIGHT AND LEFT HEART CATH;  Surgeon: Teodoro Spray, MD;  Location: [place] [facility];   Service: Cardiovascular;  Laterality: Bilateral;  . TRIGGER FINGER RELEASE      Social History   Socioeconomic History  . Marital status: Married    Spouse name: Not on file  . Number of children: Not on file  . Years of education: Not on file  . Highest education level: Not on file  Occupational History  . Not on file  Social Needs  . Financial resource strain: Not on file  . Food insecurity:    Worry: Not on file    Inability: Not on file  . Transportation needs:    Medical: Not on file    Non-medical: Not on file  Tobacco Use  . Smoking status: Former Smoker    Last attempt to quit: 02/15/1985    Years since quitting: 33.2  . Smokeless tobacco: Never Used  . Tobacco comment: quit about 31 years ago  Substance and Sexual Activity  . Alcohol use: No    Alcohol/week: 0.0 oz  . Drug use: No  . Sexual activity: Not on file  Lifestyle  . Physical activity:    Days per week: Not on file    Minutes per session: Not on file  . Stress: Not on file  Relationships  . Social connections:    Talks on phone:  Not on file    Gets together: Not on file    Attends religious service: Not on file    Active member of club or organization: Not on file    Attends meetings of clubs or organizations: Not on file    Relationship status: Not on file  . Intimate partner violence:    Fear of current or ex partner: Not on file    Emotionally abused: Not on file    Physically abused: Not on file    Forced sexual activity: Not on file  Other Topics Concern  . Not on file  Social History Narrative   Lives at home independently.    Family History  Problem Relation Age of Onset  . Uterine cancer Mother   . Breast cancer Mother 44  . Seizures Father   . Stroke Father   . Diabetes Father   . COPD Father   . Colon cancer Neg Hx      Current Outpatient Medications:  .  albuterol (PROAIR HFA) 108 (90 Base) MCG/ACT inhaler, Inhale 2 puffs into the lungs every 4 (four) hours as needed.  For shortness of breath/wheezing., Disp: , Rfl:  .  alendronate (FOSAMAX) 70 MG tablet, Take 70 mg by mouth every Tuesday. , Disp: , Rfl:  .  amitriptyline (ELAVIL) 25 MG tablet, Take by mouth., Disp: , Rfl:  .  Ascorbic Acid (VITAMIN C) 1000 MG tablet, Take 1,000 mg by mouth daily., Disp: , Rfl:  .  Biotin (RA BIOTIN) 1000 MCG tablet, Take 1,000 mcg by mouth daily., Disp: , Rfl:  .  COLLAGEN PO, Take 15 mLs by mouth daily with breakfast. Mix with coffee, Disp: , Rfl:  .  Emollient (CERAVE) CREA, Apply 1 application topically daily., Disp: , Rfl:  .  Flaxseed, Linseed, (FLAXSEED OIL) 1000 MG CAPS, Take 1,200 mg by mouth daily. , Disp: , Rfl:  .  gabapentin (NEURONTIN) 300 MG capsule, Take 300 mg by mouth at bedtime. , Disp: , Rfl:  .  insulin NPH-regular Human (NOVOLIN 70/30) (70-30) 100 UNIT/ML injection, Inject 27-50 Units into the skin 2 (two) times daily. 50 units in the morning, & 27 units in the afternoon., Disp: , Rfl:  .  losartan (COZAAR) 100 MG tablet, Take 100 mg by mouth daily with breakfast. , Disp: , Rfl:  .  metoprolol (LOPRESSOR) 100 MG tablet, Take 100 mg by mouth 2 (two) times daily., Disp: , Rfl:  .  Multiple Vitamins-Iron (MULTIVITAMINS WITH IRON) TABS tablet, Take 1 tablet by mouth daily., Disp: , Rfl:  .  Omega-3 Fatty Acids (FISH OIL) 500 MG CAPS, Take 500 mg by mouth daily., Disp: , Rfl:  .  omeprazole (PRILOSEC) 40 MG capsule, Take 40 mg by mouth daily before breakfast. , Disp: , Rfl:  .  OVER THE COUNTER MEDICATION, Apply 1 application topically at bedtime. REVITALIFT, Disp: , Rfl:  .  pravastatin (PRAVACHOL) 10 MG tablet, Take 1 tablet (10 mg total) by mouth daily at 6 PM., Disp: 30 tablet, Rfl: 0 .  sertraline (ZOLOFT) 100 MG tablet, Take 100 mg by mouth at bedtime. , Disp: , Rfl:  .  tiotropium (SPIRIVA HANDIHALER) 18 MCG inhalation capsule, Place into inhaler and inhale., Disp: , Rfl:  .  traMADol (ULTRAM) 50 MG tablet, Take 50 mg by mouth every 6 (six) hours as  needed (for pain.)., Disp: , Rfl:  .  TURMERIC PO, Take 1,000 mg by mouth daily., Disp: , Rfl:   Physical exam:  Vitals:   04/20/18 1346 04/20/18 1350  BP:  (!) 147/84  Pulse:  96  Resp:  20  Temp:  98.3 F (36.8 C)  TempSrc: Tympanic Tympanic  SpO2: 96%   Weight: 189 lb 9.6 oz (86 kg)    Physical Exam  Constitutional: She is oriented to person, place, and time. Vital signs are normal.  HENT:  Head: Normocephalic and atraumatic.  Eyes: Pupils are equal, round, and reactive to light.  Neck: Normal range of motion.  Cardiovascular: Regular rhythm and normal heart sounds. Tachycardia present.  No murmur heard. Pulmonary/Chest: Effort normal and breath sounds normal. She has no wheezes.  Abdominal: Soft. Normal appearance and bowel sounds are normal. She exhibits no distension. There is no tenderness.  Musculoskeletal: Normal range of motion. She exhibits no edema.  Neurological: She is alert and oriented to person, place, and time.  Skin: Skin is warm and dry. No rash noted.  Psychiatric: Judgment normal.     CMP Latest Ref Rng & Units 12/26/2017  Glucose 65 - 99 mg/dL -  BUN 6 - 20 mg/dL -  Creatinine 0.44 - 1.00 mg/dL 0.82  Sodium 135 - 145 mmol/L -  Potassium 3.5 - 5.1 mmol/L -  Chloride 101 - 111 mmol/L -  CO2 22 - 32 mmol/L -  Calcium 8.9 - 10.3 mg/dL -  Total Protein 6.5 - 8.1 g/dL -  Total Bilirubin 0.3 - 1.2 mg/dL -  Alkaline Phos 38 - 126 U/L -  AST 15 - 41 U/L -  ALT 14 - 54 U/L -   CBC Latest Ref Rng & Units 04/20/2018  WBC 3.6 - 11.0 K/uL 4.3  Hemoglobin 12.0 - 16.0 g/dL 11.4(L)  Hematocrit 35.0 - 47.0 % 34.7(L)  Platelets 150 - 440 K/uL 216    No images are attached to the encounter.  No results found.  Assessment and plan- Patient is a 72 y.o. female who presents with fatigue, weakness, shortness of breath and palpitations worsening over the past few weeks.   1. Iron Deficiency anemia: She is scheduled to see Dr. Grayland Ormond on Monday for labs with  possible IV feraheme. She previously received IV iron in May 2018. Ferritin has slowly been trending down. Today's ferritin is 39 with saturation ratio of 6%. Last Ferritin 57 with saturation of 21% from December 2018.  2. Fatigue/Weakness/SOB: Oxygen saturations 93% on RA, which patients tells me is her baseline. She is visibly short of breath with ambulation to clinic room. She denies chest pain, recent infection, lungs are clear and she is afebrile. No history of blood clots or PE. Not on blood thinnners. No recent travel or inactivity. Unlikely PE.   Work Up: [employer].  CBC with Diff, CMET, Iron, Ferritin.   Please set patient up for 1 dose of IV Feraheme 510 mg.  Calculated iron deficit is ~ 500 mg with a target HGB of 14 g/dL.   Patient was able to receive 1 dose of IV Feraheme today given she was symptomatic.  We can cancel her appointment for Monday to see Dr. Grayland Ormond.  She can return in 3 months for labs, MD assessment with possible IV Fereheme.  Dr. Grayland Ormond Notified of changes.   Visit Diagnosis 1. Iron deficiency anemia due to chronic blood loss     Patient expressed understanding and was in agreement with this plan. She also understands that She can call clinic at any time with any questions, concerns, or complaints.   Greater than 50% was  spent in counseling and coordination of care with this patient including but not limited to discussion of the relevant topics above (See A&P) including, but not limited to diagnosis and management of acute and chronic medical conditions.    Faythe Casa, AGNP-C [facility] [place]- [phone] Pager- [phone] 04/21/2018 11:52 AM

## 2018-04-24 ENCOUNTER — Inpatient Hospital Stay: Payer: Medicare HMO

## 2018-04-24 ENCOUNTER — Inpatient Hospital Stay: Payer: Medicare HMO | Admitting: Oncology

## 2018-04-24 DIAGNOSIS — J302 Other seasonal allergic rhinitis: Secondary | ICD-10-CM | POA: Diagnosis not present

## 2018-04-24 DIAGNOSIS — E1165 Type 2 diabetes mellitus with hyperglycemia: Secondary | ICD-10-CM | POA: Diagnosis not present

## 2018-04-25 ENCOUNTER — Ambulatory Visit: Payer: Medicare HMO | Admitting: Physical Therapy

## 2018-04-27 ENCOUNTER — Ambulatory Visit: Payer: Medicare HMO | Admitting: Physical Therapy

## 2018-05-01 ENCOUNTER — Ambulatory Visit: Payer: Medicare Other | Admitting: Physical Therapy

## 2018-05-03 ENCOUNTER — Ambulatory Visit: Payer: Medicare HMO | Admitting: Physical Therapy

## 2018-05-08 ENCOUNTER — Ambulatory Visit: Payer: Medicare HMO | Admitting: Physical Therapy

## 2018-05-10 ENCOUNTER — Ambulatory Visit: Payer: Medicare HMO | Admitting: Physical Therapy

## 2018-05-12 ENCOUNTER — Inpatient Hospital Stay: Payer: Medicare HMO | Attending: Oncology

## 2018-05-12 ENCOUNTER — Telehealth: Payer: Self-pay | Admitting: *Deleted

## 2018-05-12 DIAGNOSIS — D500 Iron deficiency anemia secondary to blood loss (chronic): Secondary | ICD-10-CM | POA: Insufficient documentation

## 2018-05-12 DIAGNOSIS — D509 Iron deficiency anemia, unspecified: Secondary | ICD-10-CM

## 2018-05-12 LAB — CBC WITH DIFFERENTIAL/PLATELET
BASOS ABS: 0.1 10*3/uL (ref 0–0.1)
BASOS PCT: 1 %
Eosinophils Absolute: 0.3 10*3/uL (ref 0–0.7)
Eosinophils Relative: 5 %
HEMATOCRIT: 30.3 % — AB (ref 35.0–47.0)
HEMOGLOBIN: 10.2 g/dL — AB (ref 12.0–16.0)
LYMPHS PCT: 25 %
Lymphs Abs: 1.2 10*3/uL (ref 1.0–3.6)
MCH: 29.5 pg (ref 26.0–34.0)
MCHC: 33.7 g/dL (ref 32.0–36.0)
MCV: 87.6 fL (ref 80.0–100.0)
MONOS PCT: 10 %
Monocytes Absolute: 0.5 10*3/uL (ref 0.2–0.9)
NEUTROS ABS: 2.8 10*3/uL (ref 1.4–6.5)
NEUTROS PCT: 59 %
Platelets: 301 10*3/uL (ref 150–440)
RBC: 3.46 MIL/uL — ABNORMAL LOW (ref 3.80–5.20)
RDW: 15.3 % — ABNORMAL HIGH (ref 11.5–14.5)
WBC: 4.9 10*3/uL (ref 3.6–11.0)

## 2018-05-12 LAB — IRON AND TIBC
Iron: 64 ug/dL (ref 28–170)
SATURATION RATIOS: 27 % (ref 10.4–31.8)
TIBC: 239 ug/dL — AB (ref 250–450)
UIBC: 175 ug/dL

## 2018-05-12 LAB — FERRITIN: FERRITIN: 132 ng/mL (ref 11–307)

## 2018-05-12 NOTE — Telephone Encounter (Signed)
Patient wants to come in today for labs, appointment accepted for 3 PM

## 2018-05-12 NOTE — Telephone Encounter (Signed)
Yes she can.  Today or next week is fine. If low, she will not be able to get iron today.

## 2018-05-12 NOTE — Telephone Encounter (Signed)
Patient called and states she feels she has not had enough iron yet and that it Is messing with her heart. She is asking if she can come in and have her labs checked. Please advise

## 2018-05-15 ENCOUNTER — Ambulatory Visit: Payer: Medicare HMO | Admitting: Physical Therapy

## 2018-05-15 DIAGNOSIS — E1165 Type 2 diabetes mellitus with hyperglycemia: Secondary | ICD-10-CM | POA: Diagnosis not present

## 2018-05-15 DIAGNOSIS — E1129 Type 2 diabetes mellitus with other diabetic kidney complication: Secondary | ICD-10-CM | POA: Diagnosis not present

## 2018-05-15 DIAGNOSIS — R809 Proteinuria, unspecified: Secondary | ICD-10-CM | POA: Diagnosis not present

## 2018-05-15 DIAGNOSIS — M810 Age-related osteoporosis without current pathological fracture: Secondary | ICD-10-CM | POA: Diagnosis not present

## 2018-05-15 DIAGNOSIS — Z794 Long term (current) use of insulin: Secondary | ICD-10-CM | POA: Diagnosis not present

## 2018-05-15 DIAGNOSIS — E1142 Type 2 diabetes mellitus with diabetic polyneuropathy: Secondary | ICD-10-CM | POA: Diagnosis not present

## 2018-05-15 DIAGNOSIS — E1159 Type 2 diabetes mellitus with other circulatory complications: Secondary | ICD-10-CM | POA: Diagnosis not present

## 2018-05-15 DIAGNOSIS — E042 Nontoxic multinodular goiter: Secondary | ICD-10-CM | POA: Diagnosis not present

## 2018-05-16 ENCOUNTER — Telehealth: Payer: Self-pay | Admitting: *Deleted

## 2018-05-16 NOTE — Telephone Encounter (Signed)
Patient called asking about results from Friday because she does not feel well.     Ref Range & Units 4d ago  WBC 3.6 - 11.0 K/uL 4.9   RBC 3.80 - 5.20 MIL/uL 3.46Low    Hemoglobin 12.0 - 16.0 g/dL 10.2Low    HCT 35.0 - 47.0 % 30.3Low    MCV 80.0 - 100.0 fL 87.6   MCH 26.0 - 34.0 pg 29.5   MCHC 32.0 - 36.0 g/dL 33.7   RDW 11.5 - 14.5 % 15.3High    Platelets 150 - 440 K/uL 301   Neutrophils Relative % % 59   Neutro Abs 1.4 - 6.5 K/uL 2.8   Lymphocytes Relative % 25   Lymphs Abs 1.0 - 3.6 K/uL 1.2   Monocytes Relative % 10   Monocytes Absolute 0.2 - 0.9 K/uL 0.5   Eosinophils Relative % 5   Eosinophils Absolute 0 - 0.7 K/uL 0.3   Basophils Relative % 1   Basophils Absolute 0 - 0.1 K/uL 0.1        Dx:  Iron deficiency anemia, unspecified i...   Ref Range & Units 4d ago  Iron 28 - 170 ug/dL 64   TIBC 250 - 450 ug/dL 239Low    Saturation Ratios 10.4 - 31.8 % 27   UIBC ug/dL 175        Dx:  Iron deficiency anemia, unspecified i...   Ref Range & Units 4d ago  Ferritin 11 - 307 ng/mL 132

## 2018-05-16 NOTE — Telephone Encounter (Signed)
I don;t think she need more Feraheme. Is she bleeding though? hbg dropped.

## 2018-05-16 NOTE — Telephone Encounter (Signed)
Patient informed that per physician she does not need iron infusion and that she needs to be checked to see if she is bleeding since her hgb has dropped by contacting GI or her PCP to be checked for GI bleeding. Left message on her voice mail

## 2018-05-17 ENCOUNTER — Ambulatory Visit: Payer: Medicare HMO | Admitting: Physical Therapy

## 2018-05-24 ENCOUNTER — Ambulatory Visit: Payer: Self-pay | Admitting: Physical Therapy

## 2018-05-29 ENCOUNTER — Ambulatory Visit: Payer: Self-pay | Admitting: Physical Therapy

## 2018-05-31 ENCOUNTER — Ambulatory Visit: Payer: Self-pay | Admitting: Physical Therapy

## 2018-06-05 ENCOUNTER — Ambulatory Visit: Payer: Self-pay | Admitting: Physical Therapy

## 2018-06-05 DIAGNOSIS — D649 Anemia, unspecified: Secondary | ICD-10-CM | POA: Diagnosis not present

## 2018-06-07 ENCOUNTER — Ambulatory Visit: Payer: Self-pay | Admitting: Physical Therapy

## 2018-06-22 DIAGNOSIS — R0602 Shortness of breath: Secondary | ICD-10-CM | POA: Diagnosis not present

## 2018-06-22 DIAGNOSIS — R002 Palpitations: Secondary | ICD-10-CM | POA: Diagnosis not present

## 2018-06-22 DIAGNOSIS — D509 Iron deficiency anemia, unspecified: Secondary | ICD-10-CM | POA: Diagnosis not present

## 2018-06-22 DIAGNOSIS — R195 Other fecal abnormalities: Secondary | ICD-10-CM | POA: Diagnosis not present

## 2018-06-23 ENCOUNTER — Ambulatory Visit
Admission: RE | Admit: 2018-06-23 | Discharge: 2018-06-23 | Disposition: A | Discharge status: Home / Self Care | Payer: Medicare HMO | Source: Ambulatory Visit | Attending: Student | Admitting: Student

## 2018-06-23 DIAGNOSIS — D509 Iron deficiency anemia, unspecified: Secondary | ICD-10-CM | POA: Diagnosis not present

## 2018-06-23 LAB — PREPARE RBC (CROSSMATCH)

## 2018-06-23 MED ORDER — SODIUM CHLORIDE 0.9% IV SOLUTION
Freq: Once | INTRAVENOUS | Status: AC
  Administered 2018-06-23: 08:00:00 via INTRAVENOUS

## 2018-06-24 LAB — TYPE AND SCREEN
ABO/RH(D): O POS
ANTIBODY SCREEN: NEGATIVE
UNIT DIVISION: 0
UNIT DIVISION: 0

## 2018-06-24 LAB — BPAM RBC
BLOOD PRODUCT EXPIRATION DATE: 201907132359
Blood Product Expiration Date: 201907192359
ISSUE DATE / TIME: 201906280904
ISSUE DATE / TIME: 201906281114
UNIT TYPE AND RH: 5100
Unit Type and Rh: 5100

## 2018-06-26 DIAGNOSIS — I350 Nonrheumatic aortic (valve) stenosis: Secondary | ICD-10-CM | POA: Diagnosis not present

## 2018-06-26 DIAGNOSIS — I10 Essential (primary) hypertension: Secondary | ICD-10-CM | POA: Diagnosis not present

## 2018-06-26 DIAGNOSIS — D500 Iron deficiency anemia secondary to blood loss (chronic): Secondary | ICD-10-CM | POA: Diagnosis not present

## 2018-06-26 DIAGNOSIS — I2510 Atherosclerotic heart disease of native coronary artery without angina pectoris: Secondary | ICD-10-CM | POA: Diagnosis not present

## 2018-06-26 DIAGNOSIS — E7800 Pure hypercholesterolemia, unspecified: Secondary | ICD-10-CM | POA: Diagnosis not present

## 2018-07-04 DIAGNOSIS — D509 Iron deficiency anemia, unspecified: Secondary | ICD-10-CM | POA: Diagnosis not present

## 2018-07-07 ENCOUNTER — Encounter
Admission: RE | Disposition: A | Discharge status: Home / Self Care | Payer: Self-pay | Source: Ambulatory Visit | Attending: Unknown Physician Specialty

## 2018-07-07 ENCOUNTER — Ambulatory Visit: Payer: Medicare HMO | Admitting: Anesthesiology

## 2018-07-07 ENCOUNTER — Encounter: Payer: Self-pay | Admitting: *Deleted

## 2018-07-07 ENCOUNTER — Ambulatory Visit
Admission: RE | Admit: 2018-07-07 | Discharge: 2018-07-07 | Disposition: A | Discharge status: Home / Self Care | Payer: Medicare HMO | Source: Ambulatory Visit | Attending: Unknown Physician Specialty | Admitting: Unknown Physician Specialty

## 2018-07-07 DIAGNOSIS — D123 Benign neoplasm of transverse colon: Secondary | ICD-10-CM | POA: Diagnosis not present

## 2018-07-07 DIAGNOSIS — Z8673 Personal history of transient ischemic attack (TIA), and cerebral infarction without residual deficits: Secondary | ICD-10-CM | POA: Diagnosis not present

## 2018-07-07 DIAGNOSIS — K449 Diaphragmatic hernia without obstruction or gangrene: Secondary | ICD-10-CM | POA: Diagnosis not present

## 2018-07-07 DIAGNOSIS — I10 Essential (primary) hypertension: Secondary | ICD-10-CM | POA: Insufficient documentation

## 2018-07-07 DIAGNOSIS — E1165 Type 2 diabetes mellitus with hyperglycemia: Secondary | ICD-10-CM | POA: Diagnosis not present

## 2018-07-07 DIAGNOSIS — K640 First degree hemorrhoids: Secondary | ICD-10-CM | POA: Insufficient documentation

## 2018-07-07 DIAGNOSIS — E7800 Pure hypercholesterolemia, unspecified: Secondary | ICD-10-CM | POA: Diagnosis not present

## 2018-07-07 DIAGNOSIS — K219 Gastro-esophageal reflux disease without esophagitis: Secondary | ICD-10-CM | POA: Insufficient documentation

## 2018-07-07 DIAGNOSIS — D509 Iron deficiency anemia, unspecified: Secondary | ICD-10-CM | POA: Insufficient documentation

## 2018-07-07 DIAGNOSIS — K2950 Unspecified chronic gastritis without bleeding: Secondary | ICD-10-CM | POA: Diagnosis not present

## 2018-07-07 DIAGNOSIS — K259 Gastric ulcer, unspecified as acute or chronic, without hemorrhage or perforation: Secondary | ICD-10-CM | POA: Diagnosis not present

## 2018-07-07 DIAGNOSIS — I2510 Atherosclerotic heart disease of native coronary artery without angina pectoris: Secondary | ICD-10-CM | POA: Insufficient documentation

## 2018-07-07 DIAGNOSIS — J449 Chronic obstructive pulmonary disease, unspecified: Secondary | ICD-10-CM | POA: Insufficient documentation

## 2018-07-07 DIAGNOSIS — Z8711 Personal history of peptic ulcer disease: Secondary | ICD-10-CM | POA: Diagnosis not present

## 2018-07-07 DIAGNOSIS — Z6836 Body mass index (BMI) 36.0-36.9, adult: Secondary | ICD-10-CM | POA: Diagnosis not present

## 2018-07-07 DIAGNOSIS — Z79899 Other long term (current) drug therapy: Secondary | ICD-10-CM | POA: Insufficient documentation

## 2018-07-07 DIAGNOSIS — K635 Polyp of colon: Secondary | ICD-10-CM | POA: Insufficient documentation

## 2018-07-07 DIAGNOSIS — E119 Type 2 diabetes mellitus without complications: Secondary | ICD-10-CM | POA: Diagnosis not present

## 2018-07-07 DIAGNOSIS — Z85828 Personal history of other malignant neoplasm of skin: Secondary | ICD-10-CM | POA: Insufficient documentation

## 2018-07-07 DIAGNOSIS — Z7983 Long term (current) use of bisphosphonates: Secondary | ICD-10-CM | POA: Diagnosis not present

## 2018-07-07 DIAGNOSIS — K5790 Diverticulosis of intestine, part unspecified, without perforation or abscess without bleeding: Secondary | ICD-10-CM | POA: Diagnosis not present

## 2018-07-07 DIAGNOSIS — Z87891 Personal history of nicotine dependence: Secondary | ICD-10-CM | POA: Insufficient documentation

## 2018-07-07 DIAGNOSIS — Z955 Presence of coronary angioplasty implant and graft: Secondary | ICD-10-CM | POA: Insufficient documentation

## 2018-07-07 DIAGNOSIS — E1151 Type 2 diabetes mellitus with diabetic peripheral angiopathy without gangrene: Secondary | ICD-10-CM | POA: Insufficient documentation

## 2018-07-07 DIAGNOSIS — Z794 Long term (current) use of insulin: Secondary | ICD-10-CM | POA: Diagnosis not present

## 2018-07-07 DIAGNOSIS — K649 Unspecified hemorrhoids: Secondary | ICD-10-CM | POA: Diagnosis not present

## 2018-07-07 DIAGNOSIS — K2960 Other gastritis without bleeding: Secondary | ICD-10-CM | POA: Diagnosis not present

## 2018-07-07 DIAGNOSIS — K319 Disease of stomach and duodenum, unspecified: Secondary | ICD-10-CM | POA: Insufficient documentation

## 2018-07-07 DIAGNOSIS — E785 Hyperlipidemia, unspecified: Secondary | ICD-10-CM | POA: Diagnosis not present

## 2018-07-07 DIAGNOSIS — K648 Other hemorrhoids: Secondary | ICD-10-CM | POA: Diagnosis not present

## 2018-07-07 DIAGNOSIS — K3189 Other diseases of stomach and duodenum: Secondary | ICD-10-CM | POA: Diagnosis not present

## 2018-07-07 DIAGNOSIS — D124 Benign neoplasm of descending colon: Secondary | ICD-10-CM | POA: Diagnosis not present

## 2018-07-07 DIAGNOSIS — I4891 Unspecified atrial fibrillation: Secondary | ICD-10-CM | POA: Diagnosis not present

## 2018-07-07 DIAGNOSIS — R195 Other fecal abnormalities: Secondary | ICD-10-CM | POA: Insufficient documentation

## 2018-07-07 HISTORY — PX: ESOPHAGOGASTRODUODENOSCOPY (EGD) WITH PROPOFOL: SHX5813

## 2018-07-07 HISTORY — PX: COLONOSCOPY WITH PROPOFOL: SHX5780

## 2018-07-07 LAB — GLUCOSE, CAPILLARY: Glucose-Capillary: 148 mg/dL — ABNORMAL HIGH (ref 70–99)

## 2018-07-07 SURGERY — ESOPHAGOGASTRODUODENOSCOPY (EGD) WITH PROPOFOL
Anesthesia: General

## 2018-07-07 MED ORDER — PROPOFOL 500 MG/50ML IV EMUL
INTRAVENOUS | Status: DC | PRN
  Administered 2018-07-07: 100 ug/kg/min via INTRAVENOUS

## 2018-07-07 MED ORDER — PROPOFOL 10 MG/ML IV BOLUS
INTRAVENOUS | Status: DC | PRN
  Administered 2018-07-07: 20 mg via INTRAVENOUS
  Administered 2018-07-07: 50 mg via INTRAVENOUS
  Administered 2018-07-07: 10 mg via INTRAVENOUS

## 2018-07-07 MED ORDER — SODIUM CHLORIDE 0.9 % IV SOLN: INTRAVENOUS | Status: DC

## 2018-07-07 MED ORDER — SODIUM CHLORIDE 0.9 % IV SOLN
INTRAVENOUS | Status: DC
  Administered 2018-07-07: 13:00:00 via INTRAVENOUS

## 2018-07-07 MED ORDER — PROPOFOL 500 MG/50ML IV EMUL
INTRAVENOUS | Status: AC
  Filled 2018-07-07: qty 50

## 2018-07-07 NOTE — H&P (Signed)
Primary Care Physician:  Maryland Pink, MD Primary Gastroenterologist:  Dr. Vira Agar  Pre-Procedure History & Physical: HPI:  Joyce Potter is a 72 y.o. female is here for an endoscopy and colonoscopy.  Done for iron def anemia and heme positive stools.   Past Medical History:  Diagnosis Date  . Anemia    iron deficiency  . Aortic stenosis   . Basal cell carcinoma   . CAD (coronary artery disease)    s/p Left circumflex stent in 2012  . Carotid stenosis   . Cataract   . High cholesterol   . HTN (hypertension)   . Hyperlipidemia   . IDDM (insulin dependent diabetes mellitus) ([place])   . Multiple thyroid nodules    Benign  . Peptic ulcer disease   . Retinal artery occlusion    on left    Past Surgical History:  Procedure Laterality Date  . ARTERY BIOPSY Left 11/19/2015   Procedure: BIOPSY TEMPORAL ARTERY;  Surgeon: Algernon Huxley, MD;  Location: [facility] ORS;  Service: Vascular;  Laterality: Left;  . BREAST BIOPSY Left 2002   core- neg  . CARDIAC CATHETERIZATION N/A 08/08/2015   Procedure: Right and Left Heart Cath and Coronary Angiography;  Surgeon: Teodoro Spray, MD;  Location: [place] [facility];  Service: Cardiovascular;  Laterality: N/A;  . CARDIAC CATHETERIZATION Bilateral 09/03/2016   Procedure: Right/Left Heart Cath and Coronary Angiography;  Surgeon: Teodoro Spray, MD;  Location: [place] [facility];  Service: Cardiovascular;  Laterality: Bilateral;  . CATARACT EXTRACTION W/ INTRAOCULAR LENS  IMPLANT, BILATERAL    . COLONOSCOPY     in 2013- internal hemorrhoids  . CORONARY ANGIOGRAPHY N/A 09/14/2017   Procedure: CORONARY ANGIOGRAPHY;  Surgeon: Teodoro Spray, MD;  Location: [place] [facility];  Service: Cardiovascular;  Laterality: N/A;  . CORONARY ANGIOPLASTY    . CORONARY STENT PLACEMENT    . ENDARTERECTOMY Left 10/01/2015   Procedure: ENDARTERECTOMY CAROTID;  Surgeon: Algernon Huxley, MD;  Location: [facility] ORS;  Service: Vascular;  Laterality: Left;  .  ESOPHAGOGASTRODUODENOSCOPY    . ESOPHAGOGASTRODUODENOSCOPY (EGD) WITH PROPOFOL N/A 05/31/2016   Procedure: ESOPHAGOGASTRODUODENOSCOPY (EGD) WITH PROPOFOL;  Surgeon: Manya Silvas, MD;  Location: [facility] ENDOSCOPY;  Service: Endoscopy;  Laterality: N/A;  . EYE SURGERY    . GIVENS CAPSULE STUDY N/A 04/17/2013   Procedure: GIVENS CAPSULE STUDY;  Surgeon: Arta Silence, MD;  Location: [facility] ENDOSCOPY;  Service: Endoscopy;  Laterality: N/A;  patient ate breakfast at 7am   . PARTIAL HYSTERECTOMY    . RIGHT AND LEFT HEART CATH Bilateral 09/14/2017   Procedure: RIGHT AND LEFT HEART CATH;  Surgeon: Teodoro Spray, MD;  Location: [place] [facility];  Service: Cardiovascular;  Laterality: Bilateral;  . TRIGGER FINGER RELEASE      Prior to Admission medications   Medication Sig Start Date End Date Taking? Authorizing Provider  albuterol (PROAIR HFA) 108 (90 Base) MCG/ACT inhaler Inhale 2 puffs into the lungs every 4 (four) hours as needed. For shortness of breath/wheezing. 02/16/16  Yes [provider]  alendronate (FOSAMAX) 70 MG tablet Take 70 mg by mouth once a week. Take with a full glass of water on an empty stomach.   Yes [provider]  insulin NPH-regular Human (NOVOLIN 70/30) (70-30) 100 UNIT/ML injection Inject 27-50 Units into the skin 2 (two) times daily. 50 units in the morning, & 27 units in the afternoon. 07/16/16  Yes [provider]  losartan (COZAAR) 100 MG tablet  Take 100 mg by mouth daily with breakfast.    Yes [provider]  metoprolol (LOPRESSOR) 100 MG tablet Take 100 mg by mouth 2 (two) times daily.   Yes [provider]  amitriptyline (ELAVIL) 25 MG tablet Take by mouth. 11/01/17 11/01/18  [provider]  Ascorbic Acid (VITAMIN C) 1000 MG tablet Take 1,000 mg by mouth daily.    [provider]  Biotin (RA BIOTIN) 1000 MCG tablet Take 1,000 mcg by mouth daily.    [provider]  COLLAGEN PO Take 15 mLs by mouth  daily with breakfast. Mix with coffee    [provider]  Emollient (CERAVE) CREA Apply 1 application topically daily.    [provider]  Flaxseed, Linseed, (FLAXSEED OIL) 1000 MG CAPS Take 1,200 mg by mouth daily.     [provider]  gabapentin (NEURONTIN) 300 MG capsule Take 300 mg by mouth at bedtime.  07/01/16 09/09/18  [provider]  Multiple Vitamins-Iron (MULTIVITAMINS WITH IRON) TABS tablet Take 1 tablet by mouth daily.    [provider]  Omega-3 Fatty Acids (FISH OIL) 500 MG CAPS Take 500 mg by mouth daily.    [provider]  omeprazole (PRILOSEC) 40 MG capsule Take 40 mg by mouth daily before breakfast.     [provider]  OVER THE COUNTER MEDICATION Apply 1 application topically at bedtime. REVITALIFT    [provider]  pravastatin (PRAVACHOL) 10 MG tablet Take 1 tablet (10 mg total) by mouth daily at 6 PM. 09/29/15   Bettey Costa, MD  sertraline (ZOLOFT) 100 MG tablet Take 100 mg by mouth at bedtime.     [provider]  tiotropium (SPIRIVA HANDIHALER) 18 MCG inhalation capsule Place into inhaler and inhale. 09/22/17 09/22/18  [provider]  traMADol (ULTRAM) 50 MG tablet Take 50 mg by mouth every 6 (six) hours as needed (for pain.).    [provider]  TURMERIC PO Take 1,000 mg by mouth daily.    [provider]    Allergies as of 06/26/2018 - Review Complete 06/23/2018  Allergen Reaction Noted  . Aspirin Other (See Comments) 09/28/2015  . Invokamet [canagliflozin-metformin hcl] Other (See Comments) 08/08/2015  . Sulfa antibiotics Other (See Comments) 04/13/2013  . Lovastatin Rash 08/08/2015  . Penicillins Rash 04/13/2013  . Vicodin [hydrocodone-acetaminophen] Rash 04/13/2013    Family History  Problem Relation Age of Onset  . Uterine cancer Mother   . Breast cancer Mother 90  . Seizures Father   . Stroke Father   . Diabetes Father   . COPD Father   . Colon  cancer Neg Hx     Social History   Socioeconomic History  . Marital status: Married    Spouse name: Not on file  . Number of children: Not on file  . Years of education: Not on file  . Highest education level: Not on file  Occupational History  . Not on file  Social Needs  . Financial resource strain: Not on file  . Food insecurity:    Worry: Not on file    Inability: Not on file  . Transportation needs:    Medical: Not on file    Non-medical: Not on file  Tobacco Use  . Smoking status: Former Smoker    Last attempt to quit: 02/15/1985    Years since quitting: 33.4  . Smokeless tobacco: Never Used  . Tobacco comment: quit about 31 years ago  Substance and Sexual  Activity  . Alcohol use: No    Alcohol/week: 0.0 oz  . Drug use: No  . Sexual activity: Not on file  Lifestyle  . Physical activity:    Days per week: Not on file    Minutes per session: Not on file  . Stress: Not on file  Relationships  . Social connections:    Talks on phone: Not on file    Gets together: Not on file    Attends religious service: Not on file    Active member of club or organization: Not on file    Attends meetings of clubs or organizations: Not on file    Relationship status: Not on file  . Intimate partner violence:    Fear of current or ex partner: Not on file    Emotionally abused: Not on file    Physically abused: Not on file    Forced sexual activity: Not on file  Other Topics Concern  . Not on file  Social History Narrative   Lives at home independently.    Review of Systems: See HPI, otherwise negative ROS  Physical Exam: BP (!) 164/85   Pulse 77   Temp 97.9 F (36.6 C) (Tympanic)   Resp 18   Ht 5\' 2"  (1.575 m)   Wt 91.2 kg (201 lb)   SpO2 100%   BMI 36.76 kg/m  General:   Alert,  pleasant and cooperative in NAD Head:  Normocephalic and atraumatic. Neck:  Supple; no masses or thyromegaly. Lungs:  Clear throughout to auscultation.    Heart:  Regular rate and  rhythm. Abdomen:  Soft, nontender and nondistended. Normal bowel sounds, without guarding, and without rebound.   Neurologic:  Alert and  oriented x4;  grossly normal neurologically.  Impression/Plan: Joyce Potter is here for an endoscopy and colonoscopy to be performed for Heme positive stool and iron def anemia.  Risks, benefits, limitations, and alternatives regarding  endoscopy and colonoscopy have been reviewed with the patient.  Questions have been answered.  All parties agreeable.   Gaylyn Cheers, MD  07/07/2018, 1:11 PM

## 2018-07-07 NOTE — Transfer of Care (Addendum)
Immediate Anesthesia Transfer of Care Note  Patient: Joyce Potter  Procedure(s) Performed: ESOPHAGOGASTRODUODENOSCOPY (EGD) WITH PROPOFOL (N/A ) COLONOSCOPY WITH PROPOFOL (N/A )  Patient Location: PACU and Endoscopy Unit  Anesthesia Type:General  Level of Consciousness: awake, alert  and oriented  Airway & Oxygen Therapy: Patient Spontanous Breathing and Patient connected to nasal cannula oxygen  Post-op Assessment: Report given to RN and Post -op Vital signs reviewed and stable  Post vital signs: Reviewed and stable  Last Vitals:  Vitals Value Taken Time  BP    Temp    Pulse    Resp    SpO2      Last Pain:  Vitals:   07/07/18 1222  TempSrc: Tympanic  PainSc: 0-No pain         Complications: No apparent anesthesia complications

## 2018-07-07 NOTE — Op Note (Signed)
[facility] Gastroenterology Patient Name: Joyce Potter Procedure Date: 07/07/2018 12:48 PM MRN: [id number] Account #: [id number] Date of Birth: 05-17-1946 Admit Type: Outpatient Age: 72 Room: [facility] ENDO ROOM 1 Gender: Female Note Status: Finalized Procedure:            Colonoscopy Indications:          Iron deficiency anemia Providers:            Manya Silvas, MD Referring MD:         Irven Easterly. Kary Kos, MD (Referring MD) Medicines:            Propofol per Anesthesia Complications:        No immediate complications. Procedure:            Pre-Anesthesia Assessment:                       - After reviewing the risks and benefits, the patient                        was deemed in satisfactory condition to undergo the                        procedure.                       After obtaining informed consent, the colonoscope was                        passed under direct vision. Throughout the procedure,                        the patient's blood pressure, pulse, and oxygen                        saturations were monitored continuously. The                        Colonoscope was introduced through the anus and                        advanced to the the cecum, identified by appendiceal                        orifice and ileocecal valve. The colonoscopy was                        performed without difficulty. The patient tolerated the                        procedure well. The quality of the bowel preparation                        was excellent. Findings:      A diminutive polyp was found in the descending colon. The polyp was       sessile. The polyp was removed with a jumbo cold forceps. Resection and       retrieval were complete.      A diminutive polyp was found in the transverse colon. The polyp was       sessile. The polyp was removed with a jumbo cold forceps. Resection and  retrieval were complete.      Internal hemorrhoids were found during  endoscopy. The hemorrhoids were       small and Grade I (internal hemorrhoids that do not prolapse).      The exam was otherwise without abnormality. Impression:           - One diminutive polyp in the descending colon, removed                        with a jumbo cold forceps. Resected and retrieved.                       - One diminutive polyp in the transverse colon, removed                        with a jumbo cold forceps. Resected and retrieved.                       - Internal hemorrhoids.                       - The examination was otherwise normal. Recommendation:       - Await pathology results. Schedule a Capsule Endoscopy                        due to heme positive stool and iron def anemia. Manya Silvas, MD 07/07/2018 1:53:47 PM This report has been signed electronically. Number of Addenda: 0 Note Initiated On: 07/07/2018 12:48 PM Scope Withdrawal Time: 0 hours 17 minutes 11 seconds  Total Procedure Duration: 0 hours 22 minutes 22 seconds       [facility]

## 2018-07-07 NOTE — Anesthesia Post-op Follow-up Note (Signed)
Anesthesia QCDR form completed.        

## 2018-07-07 NOTE — Anesthesia Preprocedure Evaluation (Signed)
Anesthesia Evaluation  Patient identified by MRN, date of birth, ID band Patient awake    Reviewed: Allergy & Precautions, NPO status , Patient's Chart, lab work & pertinent test results, reviewed documented beta blocker date and time   History of Anesthesia Complications Negative for: history of anesthetic complications  Airway Mallampati: III  TM Distance: >3 FB     Dental  (+) Edentulous Upper, Edentulous Lower, Upper Dentures, Lower Dentures   Pulmonary shortness of breath and with exertion, neg sleep apnea, COPD (mild), neg recent URI, former smoker,           Cardiovascular hypertension, Pt. on medications and Pt. on home beta blockers (-) angina+ CAD, + Cardiac Stents (placed in 2014) and + Peripheral Vascular Disease  (-) Past MI and (-) CABG + dysrhythmias (Currently in NSR) Atrial Fibrillation + Valvular Problems/Murmurs AS      Neuro/Psych neg Seizures CVA, No Residual Symptoms    GI/Hepatic Neg liver ROS, PUD, GERD  Medicated and Controlled,  Endo/Other  diabetes, Poorly Controlled, Type 2, Insulin DependentMorbid obesity  Renal/GU      Musculoskeletal   Abdominal   Peds  Hematology  (+) anemia ,   Anesthesia Other Findings Past Medical History: No date: Anemia     Comment:  iron deficiency No date: Aortic stenosis No date: Basal cell carcinoma No date: CAD (coronary artery disease)     Comment:  s/p Left circumflex stent in 2012 No date: Carotid stenosis No date: Cataract No date: High cholesterol No date: HTN (hypertension) No date: Hyperlipidemia No date: IDDM (insulin dependent diabetes mellitus) ([place]) No date: Multiple thyroid nodules     Comment:  Benign No date: Peptic ulcer disease No date: Retinal artery occlusion     Comment:  on left   Reproductive/Obstetrics                             Anesthesia Physical  Anesthesia Plan  ASA: III  Anesthesia Plan:  General   Post-op Pain Management:    Induction: Intravenous  PONV Risk Score and Plan: 3 and Propofol infusion and TIVA  Airway Management Planned: Nasal Cannula  Additional Equipment: Arterial line  Intra-op Plan:   Post-operative Plan:   Informed Consent: I have reviewed the patients History and Physical, chart, labs and discussed the procedure including the risks, benefits and alternatives for the proposed anesthesia with the patient or authorized representative who has indicated his/her understanding and acceptance.     Plan Discussed with: CRNA, Anesthesiologist and Surgeon  Anesthesia Plan Comments:         Anesthesia Quick Evaluation

## 2018-07-07 NOTE — Op Note (Signed)
[facility] Gastroenterology Patient Name: Joyce Potter Procedure Date: 07/07/2018 12:49 PM MRN: [id number] Account #: [id number] Date of Birth: 1946/07/24 Admit Type: Outpatient Age: 72 Room: [facility] ENDO ROOM 1 Gender: Female Note Status: Finalized Procedure:            Upper GI endoscopy Indications:          Unexplained iron deficiency anemia, Heme positive stool Providers:            Manya Silvas, MD Referring MD:         Irven Easterly. Kary Kos, MD (Referring MD) Medicines:            Propofol per Anesthesia Complications:        No immediate complications. Procedure:            Pre-Anesthesia Assessment:                       - After reviewing the risks and benefits, the patient                        was deemed in satisfactory condition to undergo the                        procedure.                       After obtaining informed consent, the endoscope was                        passed under direct vision. Throughout the procedure,                        the patient's blood pressure, pulse, and oxygen                        saturations were monitored continuously. The Endoscope                        was introduced through the mouth, and advanced to the                        second part of duodenum. The upper GI endoscopy was                        accomplished without difficulty. The patient tolerated                        the procedure well. Findings:      The examined esophagus was normal.      A small hiatal hernia was present.      Localized minimal inflammation characterized by erosions and granularity       was found in the gastric antrum. Biopsies were taken with a cold forceps       for histology. Biopsies were taken with a cold forceps for Helicobacter       pylori testing.      The examined duodenum was normal. Impression:           - Normal esophagus.                       - Small hiatal hernia.                       -  Gastritis.  Biopsied.                       - Normal examined duodenum. Recommendation:       - Await pathology results. Manya Silvas, MD 07/07/2018 1:25:58 PM This report has been signed electronically. Number of Addenda: 0 Note Initiated On: 07/07/2018 12:49 PM      [facility]

## 2018-07-08 ENCOUNTER — Encounter: Payer: Self-pay | Admitting: Oncology

## 2018-07-08 NOTE — Anesthesia Postprocedure Evaluation (Signed)
Anesthesia Post Note  Patient: MARTISHA TOULOUSE  Procedure(s) Performed: ESOPHAGOGASTRODUODENOSCOPY (EGD) WITH PROPOFOL (N/A ) COLONOSCOPY WITH PROPOFOL (N/A )  Patient location during evaluation: Endoscopy Anesthesia Type: General Level of consciousness: awake and alert Pain management: pain level controlled Vital Signs Assessment: post-procedure vital signs reviewed and stable Respiratory status: spontaneous breathing, nonlabored ventilation, respiratory function stable and patient connected to nasal cannula oxygen Cardiovascular status: blood pressure returned to baseline and stable Postop Assessment: no apparent nausea or vomiting Anesthetic complications: no     Last Vitals:  Vitals:   07/07/18 1408 07/07/18 1418  BP: (!) 116/54 125/63  Pulse:    Resp:  16  Temp:    SpO2:      Last Pain:  Vitals:   07/07/18 1418  TempSrc:   PainSc: 0-No pain                 Martha Clan

## 2018-07-09 ENCOUNTER — Encounter: Payer: Self-pay | Admitting: Unknown Physician Specialty

## 2018-07-12 LAB — SURGICAL PATHOLOGY

## 2018-07-16 ENCOUNTER — Inpatient Hospital Stay
Admission: EM | Admit: 2018-07-16 | Discharge: 2018-07-18 | DRG: 378 | Disposition: A | Discharge status: Home / Self Care | Payer: Medicare HMO | Attending: Family Medicine | Admitting: Family Medicine

## 2018-07-16 ENCOUNTER — Emergency Department: Payer: Medicare HMO

## 2018-07-16 ENCOUNTER — Other Ambulatory Visit: Payer: Self-pay

## 2018-07-16 DIAGNOSIS — R079 Chest pain, unspecified: Secondary | ICD-10-CM | POA: Diagnosis not present

## 2018-07-16 DIAGNOSIS — I2510 Atherosclerotic heart disease of native coronary artery without angina pectoris: Secondary | ICD-10-CM | POA: Diagnosis present

## 2018-07-16 DIAGNOSIS — Z885 Allergy status to narcotic agent status: Secondary | ICD-10-CM

## 2018-07-16 DIAGNOSIS — Z823 Family history of stroke: Secondary | ICD-10-CM

## 2018-07-16 DIAGNOSIS — Z794 Long term (current) use of insulin: Secondary | ICD-10-CM

## 2018-07-16 DIAGNOSIS — Z803 Family history of malignant neoplasm of breast: Secondary | ICD-10-CM

## 2018-07-16 DIAGNOSIS — Z8049 Family history of malignant neoplasm of other genital organs: Secondary | ICD-10-CM

## 2018-07-16 DIAGNOSIS — Z882 Allergy status to sulfonamides status: Secondary | ICD-10-CM

## 2018-07-16 DIAGNOSIS — K921 Melena: Secondary | ICD-10-CM

## 2018-07-16 DIAGNOSIS — K760 Fatty (change of) liver, not elsewhere classified: Secondary | ICD-10-CM | POA: Diagnosis present

## 2018-07-16 DIAGNOSIS — K922 Gastrointestinal hemorrhage, unspecified: Secondary | ICD-10-CM | POA: Diagnosis not present

## 2018-07-16 DIAGNOSIS — E7800 Pure hypercholesterolemia, unspecified: Secondary | ICD-10-CM | POA: Diagnosis present

## 2018-07-16 DIAGNOSIS — Z79899 Other long term (current) drug therapy: Secondary | ICD-10-CM

## 2018-07-16 DIAGNOSIS — Z825 Family history of asthma and other chronic lower respiratory diseases: Secondary | ICD-10-CM

## 2018-07-16 DIAGNOSIS — Z7983 Long term (current) use of bisphosphonates: Secondary | ICD-10-CM

## 2018-07-16 DIAGNOSIS — E119 Type 2 diabetes mellitus without complications: Secondary | ICD-10-CM | POA: Diagnosis present

## 2018-07-16 DIAGNOSIS — Z90711 Acquired absence of uterus with remaining cervical stump: Secondary | ICD-10-CM

## 2018-07-16 DIAGNOSIS — Z8719 Personal history of other diseases of the digestive system: Secondary | ICD-10-CM

## 2018-07-16 DIAGNOSIS — Z9842 Cataract extraction status, left eye: Secondary | ICD-10-CM

## 2018-07-16 DIAGNOSIS — Z961 Presence of intraocular lens: Secondary | ICD-10-CM | POA: Diagnosis present

## 2018-07-16 DIAGNOSIS — J449 Chronic obstructive pulmonary disease, unspecified: Secondary | ICD-10-CM | POA: Diagnosis present

## 2018-07-16 DIAGNOSIS — I10 Essential (primary) hypertension: Secondary | ICD-10-CM | POA: Diagnosis present

## 2018-07-16 DIAGNOSIS — Z9841 Cataract extraction status, right eye: Secondary | ICD-10-CM

## 2018-07-16 DIAGNOSIS — R0602 Shortness of breath: Secondary | ICD-10-CM | POA: Diagnosis not present

## 2018-07-16 DIAGNOSIS — Z888 Allergy status to other drugs, medicaments and biological substances status: Secondary | ICD-10-CM

## 2018-07-16 DIAGNOSIS — D62 Acute posthemorrhagic anemia: Secondary | ICD-10-CM | POA: Diagnosis present

## 2018-07-16 DIAGNOSIS — D649 Anemia, unspecified: Secondary | ICD-10-CM

## 2018-07-16 DIAGNOSIS — Z79891 Long term (current) use of opiate analgesic: Secondary | ICD-10-CM

## 2018-07-16 DIAGNOSIS — Z880 Allergy status to penicillin: Secondary | ICD-10-CM

## 2018-07-16 DIAGNOSIS — K2900 Acute gastritis without bleeding: Secondary | ICD-10-CM | POA: Diagnosis not present

## 2018-07-16 DIAGNOSIS — D509 Iron deficiency anemia, unspecified: Secondary | ICD-10-CM | POA: Diagnosis present

## 2018-07-16 DIAGNOSIS — E785 Hyperlipidemia, unspecified: Secondary | ICD-10-CM | POA: Diagnosis present

## 2018-07-16 DIAGNOSIS — Z87891 Personal history of nicotine dependence: Secondary | ICD-10-CM

## 2018-07-16 DIAGNOSIS — Z86010 Personal history of colonic polyps: Secondary | ICD-10-CM

## 2018-07-16 DIAGNOSIS — K219 Gastro-esophageal reflux disease without esophagitis: Secondary | ICD-10-CM | POA: Diagnosis present

## 2018-07-16 DIAGNOSIS — Z6835 Body mass index (BMI) 35.0-35.9, adult: Secondary | ICD-10-CM

## 2018-07-16 DIAGNOSIS — Z8711 Personal history of peptic ulcer disease: Secondary | ICD-10-CM

## 2018-07-16 DIAGNOSIS — Z85828 Personal history of other malignant neoplasm of skin: Secondary | ICD-10-CM

## 2018-07-16 DIAGNOSIS — K2901 Acute gastritis with bleeding: Principal | ICD-10-CM | POA: Diagnosis present

## 2018-07-16 DIAGNOSIS — I350 Nonrheumatic aortic (valve) stenosis: Secondary | ICD-10-CM | POA: Diagnosis present

## 2018-07-16 DIAGNOSIS — Z886 Allergy status to analgesic agent status: Secondary | ICD-10-CM

## 2018-07-16 DIAGNOSIS — Z833 Family history of diabetes mellitus: Secondary | ICD-10-CM

## 2018-07-16 DIAGNOSIS — Z955 Presence of coronary angioplasty implant and graft: Secondary | ICD-10-CM

## 2018-07-16 LAB — COMPREHENSIVE METABOLIC PANEL
ALT: 14 U/L (ref 0–44)
ANION GAP: 8 (ref 5–15)
AST: 22 U/L (ref 15–41)
Albumin: 3.8 g/dL (ref 3.5–5.0)
Alkaline Phosphatase: 65 U/L (ref 38–126)
BUN: 33 mg/dL — ABNORMAL HIGH (ref 8–23)
CO2: 26 mmol/L (ref 22–32)
CREATININE: 0.98 mg/dL (ref 0.44–1.00)
Calcium: 9.2 mg/dL (ref 8.9–10.3)
Chloride: 105 mmol/L (ref 98–111)
GFR, EST NON AFRICAN AMERICAN: 57 mL/min — AB (ref >60)
Glucose, Bld: 251 mg/dL — ABNORMAL HIGH (ref 70–99)
POTASSIUM: 4.5 mmol/L (ref 3.5–5.1)
SODIUM: 139 mmol/L (ref 135–145)
Total Bilirubin: 0.4 mg/dL (ref 0.3–1.2)
Total Protein: 7 g/dL (ref 6.5–8.1)

## 2018-07-16 LAB — CBC
HCT: 20.6 % — ABNORMAL LOW (ref 35.0–47.0)
Hemoglobin: 6.7 g/dL — ABNORMAL LOW (ref 12.0–16.0)
MCH: 26.4 pg (ref 26.0–34.0)
MCHC: 32.7 g/dL (ref 32.0–36.0)
MCV: 80.8 fL (ref 80.0–100.0)
PLATELETS: 348 10*3/uL (ref 150–440)
RBC: 2.55 MIL/uL — ABNORMAL LOW (ref 3.80–5.20)
RDW: 16.4 % — AB (ref 11.5–14.5)
WBC: 5.1 10*3/uL (ref 3.6–11.0)

## 2018-07-16 LAB — TROPONIN I: Troponin I: <0.03 ng/mL (ref <0.03)

## 2018-07-16 MED ORDER — SODIUM CHLORIDE 0.9 % IV SOLN
80.0000 mg | Freq: Once | INTRAVENOUS | Status: AC
  Administered 2018-07-17: 80 mg via INTRAVENOUS
  Filled 2018-07-16: qty 80

## 2018-07-16 MED ORDER — SODIUM CHLORIDE 0.9 % IV SOLN
10.0000 mL/h | Freq: Once | INTRAVENOUS | Status: AC
  Administered 2018-07-17: 10 mL/h via INTRAVENOUS

## 2018-07-16 MED ORDER — PANTOPRAZOLE SODIUM 40 MG IV SOLR
8.0000 mg/h | INTRAVENOUS | Status: DC
  Administered 2018-07-17 – 2018-07-18 (×4): 8 mg/h via INTRAVENOUS
  Filled 2018-07-16 (×4): qty 80

## 2018-07-16 NOTE — ED Provider Notes (Signed)
[facility] Emergency Department Provider Note   ____________________________________________   First MD Initiated Contact with Patient 07/16/18 2306     (approximate)  I have reviewed the triage vital signs and the nursing notes.   HISTORY  Chief Complaint Chest Pain    HPI Joyce Potter is a 72 y.o. female who presents to the ED from home with a chief complaint of chest pain and dyspnea on exertion.  Patient was transfused blood for symptomatic anemia 2 weeks ago.  Had EGD and colonoscopy which demonstrated gastritis; several polyps were removed.  Reports chest pain which started this afternoon and worsened throughout the day.  Exacerbated by exertion.  Denies associated diaphoresis, nausea vomiting, palpitations.  Does complain of dizziness.  Denies recent fever, chills, cough, abdominal pain, dysuria, diarrhea.  Denies recent travel or trauma.  Denies use of anticoagulants.   Past Medical History:  Diagnosis Date  . Anemia    iron deficiency  . Aortic stenosis   . Basal cell carcinoma   . CAD (coronary artery disease)    s/p Left circumflex stent in 2012  . Carotid stenosis   . Cataract   . High cholesterol   . HTN (hypertension)   . Hyperlipidemia   . IDDM (insulin dependent diabetes mellitus) ([place])   . Multiple thyroid nodules    Benign  . Peptic ulcer disease   . Retinal artery occlusion    on left    Patient Active Problem List   Diagnosis Date Noted  . PVD (peripheral vascular disease) ([place]) 02/11/2017  . Iron deficiency anemia due to chronic blood loss 07/13/2016  . Central retinal artery occlusion 10/01/2015  . Carotid stenosis 10/01/2015  . Stroke ([place]) 09/28/2015  . GI bleed from an occult source with recurrent chronic blood loss anemia 04/14/2013  . CAD (coronary artery disease) with demand ischemia from anemia 04/14/2013  . IDDM (insulin dependent diabetes mellitus) ([place]) 04/14/2013  . HTN (hypertension) 04/14/2013  .  Chest pain 04/14/2013    Past Surgical History:  Procedure Laterality Date  . ARTERY BIOPSY Left 11/19/2015   Procedure: BIOPSY TEMPORAL ARTERY;  Surgeon: Algernon Huxley, MD;  Location: [facility] ORS;  Service: Vascular;  Laterality: Left;  . BREAST BIOPSY Left 2002   core- neg  . CARDIAC CATHETERIZATION N/A 08/08/2015   Procedure: Right and Left Heart Cath and Coronary Angiography;  Surgeon: Teodoro Spray, MD;  Location: [place] CV LAB;  Service: Cardiovascular;  Laterality: N/A;  . CARDIAC CATHETERIZATION Bilateral 09/03/2016   Procedure: Right/Left Heart Cath and Coronary Angiography;  Surgeon: Teodoro Spray, MD;  Location: [place] CV LAB;  Service: Cardiovascular;  Laterality: Bilateral;  . CATARACT EXTRACTION W/ INTRAOCULAR LENS  IMPLANT, BILATERAL    . COLONOSCOPY     in 2013- internal hemorrhoids  . COLONOSCOPY WITH PROPOFOL N/A 07/07/2018   Procedure: COLONOSCOPY WITH PROPOFOL;  Surgeon: Manya Silvas, MD;  Location: [facility] ENDOSCOPY;  Service: Endoscopy;  Laterality: N/A;  . CORONARY ANGIOGRAPHY N/A 09/14/2017   Procedure: CORONARY ANGIOGRAPHY;  Surgeon: Teodoro Spray, MD;  Location: [place] CV LAB;  Service: Cardiovascular;  Laterality: N/A;  . CORONARY ANGIOPLASTY    . CORONARY STENT PLACEMENT    . ENDARTERECTOMY Left 10/01/2015   Procedure: ENDARTERECTOMY CAROTID;  Surgeon: Algernon Huxley, MD;  Location: [facility] ORS;  Service: Vascular;  Laterality: Left;  . ESOPHAGOGASTRODUODENOSCOPY    . ESOPHAGOGASTRODUODENOSCOPY (EGD) WITH PROPOFOL N/A 05/31/2016   Procedure: ESOPHAGOGASTRODUODENOSCOPY (EGD) WITH PROPOFOL;  Surgeon: Manya Silvas, MD;  Location: [facility] ENDOSCOPY;  Service: Endoscopy;  Laterality: N/A;  . ESOPHAGOGASTRODUODENOSCOPY (EGD) WITH PROPOFOL N/A 07/07/2018   Procedure: ESOPHAGOGASTRODUODENOSCOPY (EGD) WITH PROPOFOL;  Surgeon: Manya Silvas, MD;  Location: [facility] ENDOSCOPY;  Service: Endoscopy;  Laterality: N/A;  . EYE SURGERY    . GIVENS CAPSULE STUDY N/A  04/17/2013   Procedure: GIVENS CAPSULE STUDY;  Surgeon: Arta Silence, MD;  Location: [facility] ENDOSCOPY;  Service: Endoscopy;  Laterality: N/A;  patient ate breakfast at 7am   . PARTIAL HYSTERECTOMY    . RIGHT AND LEFT HEART CATH Bilateral 09/14/2017   Procedure: RIGHT AND LEFT HEART CATH;  Surgeon: Teodoro Spray, MD;  Location: [place] CV LAB;  Service: Cardiovascular;  Laterality: Bilateral;  . TRIGGER FINGER RELEASE      Prior to Admission medications   Medication Sig Start Date End Date Taking? Authorizing Provider  albuterol (PROAIR HFA) 108 (90 Base) MCG/ACT inhaler Inhale 2 puffs into the lungs every 4 (four) hours as needed. For shortness of breath/wheezing. 02/16/16   [provider]  alendronate (FOSAMAX) 70 MG tablet Take 70 mg by mouth once a week. Take with a full glass of water on an empty stomach.    [provider]  amitriptyline (ELAVIL) 25 MG tablet Take by mouth. 11/01/17 11/01/18  [provider]  Ascorbic Acid (VITAMIN C) 1000 MG tablet Take 1,000 mg by mouth daily.    [provider]  Biotin (RA BIOTIN) 1000 MCG tablet Take 1,000 mcg by mouth daily.    [provider]  COLLAGEN PO Take 15 mLs by mouth daily with breakfast. Mix with coffee    [provider]  Emollient (CERAVE) CREA Apply 1 application topically daily.    [provider]  Flaxseed, Linseed, (FLAXSEED OIL) 1000 MG CAPS Take 1,200 mg by mouth daily.     [provider]  gabapentin (NEURONTIN) 300 MG capsule Take 300 mg by mouth at bedtime.  07/01/16 09/09/18  [provider]  insulin NPH-regular Human (NOVOLIN 70/30) (70-30) 100 UNIT/ML injection Inject 27-50 Units into the skin 2 (two) times daily. 50 units in the morning, & 27 units in the afternoon. 07/16/16   [provider]  losartan (COZAAR) 100 MG tablet Take 100 mg by mouth daily with breakfast.     [provider]  metoprolol (LOPRESSOR) 100 MG tablet Take  100 mg by mouth 2 (two) times daily.    [provider]  Multiple Vitamins-Iron (MULTIVITAMINS WITH IRON) TABS tablet Take 1 tablet by mouth daily.    [provider]  Omega-3 Fatty Acids (FISH OIL) 500 MG CAPS Take 500 mg by mouth daily.    [provider]  omeprazole (PRILOSEC) 40 MG capsule Take 40 mg by mouth daily before breakfast.     [provider]  OVER THE COUNTER MEDICATION Apply 1 application topically at bedtime. REVITALIFT    [provider]  pravastatin (PRAVACHOL) 10 MG tablet Take 1 tablet (10 mg total) by mouth daily at 6 PM. 09/29/15   Bettey Costa, MD  sertraline (ZOLOFT) 100 MG tablet Take 100 mg by mouth at bedtime.     [provider]  tiotropium (SPIRIVA HANDIHALER) 18 MCG inhalation capsule Place into inhaler and inhale. 09/22/17 09/22/18  [provider]  traMADol (ULTRAM) 50 MG tablet Take 50 mg by mouth every 6 (six) hours as needed (for pain.).    [provider]  TURMERIC PO Take 1,000 mg  by mouth daily.    [provider]    Allergies Aspirin; Invokamet [canagliflozin-metformin hcl]; Sulfa antibiotics; Lovastatin; Penicillins; and Vicodin [hydrocodone-acetaminophen]  Family History  Problem Relation Age of Onset  . Uterine cancer Mother   . Breast cancer Mother 55  . Seizures Father   . Stroke Father   . Diabetes Father   . COPD Father   . Colon cancer Neg Hx     Social History Social History   Tobacco Use  . Smoking status: Former Smoker    Last attempt to quit: 02/15/1985    Years since quitting: 33.4  . Smokeless tobacco: Never Used  . Tobacco comment: quit about 31 years ago  Substance Use Topics  . Alcohol use: No    Alcohol/week: 0.0 oz  . Drug use: No    Review of Systems  Constitutional: No fever/chills Eyes: No visual changes. ENT: No sore throat. Cardiovascular: Positive for chest pain. Respiratory: Positive for shortness of breath. Gastrointestinal: No  abdominal pain.  No nausea, no vomiting.  No diarrhea.  No constipation. Genitourinary: Negative for dysuria. Musculoskeletal: Negative for back pain. Skin: Negative for rash. Neurological: Negative for headaches, focal weakness or numbness.   ____________________________________________   PHYSICAL EXAM:  VITAL SIGNS: ED Triage Vitals  Enc Vitals Group     BP 07/16/18 2153 (!) 171/61     Pulse Rate 07/16/18 2153 85     Resp 07/16/18 2153 18     Temp 07/16/18 2153 (!) 97.5 F (36.4 C)     Temp Source 07/16/18 2153 Oral     SpO2 07/16/18 2153 99 %     Weight 07/16/18 2154 201 lb (91.2 kg)     Height 07/16/18 2154 5\' 2"  (1.575 m)     Head Circumference --      Peak Flow --      Pain Score 07/16/18 2153 9     Pain Loc --      Pain Edu? --      Excl. in [place]? --     Constitutional: Alert and oriented. Well appearing and in no acute distress. Eyes: Conjunctivae are normal. PERRL. EOMI. Head: Atraumatic. Nose: No congestion/rhinnorhea. Mouth/Throat: Mucous membranes are moist.  Oropharynx non-erythematous. Neck: No stridor.   Cardiovascular: Normal rate, regular rhythm. Grossly normal heart sounds.  Good peripheral circulation. Respiratory: Normal respiratory effort.  No retractions. Lungs CTAB. Gastrointestinal: Soft and nontender. No distention. No abdominal bruits. No CVA tenderness. Musculoskeletal: No lower extremity tenderness nor edema.  No joint effusions. Neurologic:  Normal speech and language. No gross focal neurologic deficits are appreciated.  Skin:  Skin is pale, warm, dry and intact. No rash noted. Psychiatric: Mood and affect are normal. Speech and behavior are normal.  ____________________________________________   LABS (all labs ordered are listed, but only abnormal results are displayed)  Labs Reviewed  CBC - Abnormal; Notable for the following components:      Result Value   RBC 2.55 (*)    Hemoglobin 6.7 (*)    HCT 20.6 (*)    RDW 16.4 (*)    All  other components within normal limits  COMPREHENSIVE METABOLIC PANEL - Abnormal; Notable for the following components:   Glucose, Bld 251 (*)    BUN 33 (*)    GFR calc non Af Amer 57 (*)    All other components within normal limits  TROPONIN I  PREPARE RBC (CROSSMATCH)   ____________________________________________  EKG  ED ECG REPORT I, Naiya Corral J, the  attending physician, personally viewed and interpreted this ECG.   Date: 07/16/2018  EKG Time: 2158  Rate: 81  Rhythm: normal EKG, normal sinus rhythm  Axis: Normal  Intervals:none  ST&T Change: Nonspecific  ____________________________________________  RADIOLOGY  ED MD interpretation: No acute cardiopulmonary process  Official radiology report(s): Dg Chest 2 View  Result Date: 07/16/2018 CLINICAL DATA:  Chest pain starting today in worse throughout the day. Shortness of breath on ambulation. History of anemia. EXAM: CHEST - 2 VIEW COMPARISON:  09/06/2016 FINDINGS: Borderline heart size with normal pulmonary vascularity. Mild linear fibrosis or atelectasis in the lung bases is similar to previous study. No focal consolidation. No blunting of costophrenic angles. No pneumothorax. Mediastinal contours appear intact. Degenerative changes in the spine. IMPRESSION: Borderline heart size. Mild linear fibrosis or atelectasis in the lung bases. No evidence of active pulmonary disease. Electronically Signed   By: Lucienne Capers M.D.   On: 07/16/2018 22:50    ____________________________________________   PROCEDURES  Procedure(s) performed:   Rectal exam: External exam within normal limits.  Melanotic stool on gloved finger which is immediately heme positive.  Procedures  Critical Care performed: Yes, see critical care note(s)  CRITICAL CARE Performed by: Paulette Blanch   Total critical care time: 30 minutes  Critical care time was exclusive of separately billable procedures and treating other patients.  Critical care  was necessary to treat or prevent imminent or life-threatening deterioration.  Critical care was time spent personally by me on the following activities: development of treatment plan with patient and/or surrogate as well as nursing, discussions with consultants, evaluation of patient's response to treatment, examination of patient, obtaining history from patient or surrogate, ordering and performing treatments and interventions, ordering and review of laboratory studies, ordering and review of radiographic studies, pulse oximetry and re-evaluation of patient's condition. ____________________________________________   INITIAL IMPRESSION / ASSESSMENT AND PLAN / ED COURSE  As part of my medical decision making, I reviewed the following data within the [place] notes reviewed and incorporated, Labs reviewed, Old chart reviewed, Discussed with admitting physician and Notes from prior ED visits   72 year old female who presents with chest pain and shortness of breath. Differential diagnosis includes, but is not limited to, ACS, aortic dissection, pulmonary embolism, cardiac tamponade, pneumothorax, pneumonia, pericarditis, myocarditis, GI-related causes including esophagitis/gastritis, and musculoskeletal chest wall pain.    Laboratory and imaging results remarkable for anemia which is changed from 2 months ago.  Patient symptoms most likely secondary to symptomatic anemia.  Will transfuse 2 units of PRBCs.  Will discuss with hospitalist to evaluate patient in the emergency department for admission.  Clinical Course as of Jul 16 2330  Nancy Fetter Jul 16, 2018  2330 Hemoglobin(!): 6.7 [JS]  2330 HCT(!): 20.6 [JS]    Clinical Course User Index [JS] Paulette Blanch, MD     ____________________________________________   FINAL CLINICAL IMPRESSION(S) / ED DIAGNOSES  Final diagnoses:  Symptomatic anemia  Melena  Gastrointestinal hemorrhage associated with acute gastritis      ED Discharge Orders    None       Note:  This document was prepared using Dragon voice recognition software and may include unintentional dictation errors.    Paulette Blanch, MD 07/17/18 [phone]

## 2018-07-16 NOTE — ED Notes (Signed)
3 attempts at IV access by 2 different staff members unsuccessful; pt said last time she was here the IV was started by [place]; Ena Dawley, RN informed of same and will attempt IV's with [place]

## 2018-07-16 NOTE — ED Triage Notes (Signed)
Patient chest pain that started earlier today. Worsening throughout the day. Gets short of breath when she is ambulatory from room to room. Was here two weeks ago and received two pints of blood for anemia. Has an "aortic valve that won't close."

## 2018-07-16 NOTE — ED Notes (Signed)
Report given to Laurie. RN.

## 2018-07-17 ENCOUNTER — Encounter: Payer: Self-pay | Admitting: *Deleted

## 2018-07-17 ENCOUNTER — Other Ambulatory Visit: Payer: Self-pay

## 2018-07-17 ENCOUNTER — Encounter
Admission: EM | Disposition: A | Discharge status: Home / Self Care | Payer: Self-pay | Source: Home / Self Care | Attending: Family Medicine

## 2018-07-17 ENCOUNTER — Inpatient Hospital Stay: Payer: Medicare HMO | Admitting: Anesthesiology

## 2018-07-17 ENCOUNTER — Inpatient Hospital Stay: Payer: Medicare HMO

## 2018-07-17 DIAGNOSIS — Z882 Allergy status to sulfonamides status: Secondary | ICD-10-CM | POA: Diagnosis not present

## 2018-07-17 DIAGNOSIS — I350 Nonrheumatic aortic (valve) stenosis: Secondary | ICD-10-CM | POA: Diagnosis present

## 2018-07-17 DIAGNOSIS — Z955 Presence of coronary angioplasty implant and graft: Secondary | ICD-10-CM | POA: Diagnosis not present

## 2018-07-17 DIAGNOSIS — D649 Anemia, unspecified: Secondary | ICD-10-CM

## 2018-07-17 DIAGNOSIS — K922 Gastrointestinal hemorrhage, unspecified: Secondary | ICD-10-CM | POA: Diagnosis not present

## 2018-07-17 DIAGNOSIS — K921 Melena: Secondary | ICD-10-CM | POA: Diagnosis not present

## 2018-07-17 DIAGNOSIS — K2970 Gastritis, unspecified, without bleeding: Secondary | ICD-10-CM | POA: Diagnosis not present

## 2018-07-17 DIAGNOSIS — K760 Fatty (change of) liver, not elsewhere classified: Secondary | ICD-10-CM | POA: Diagnosis not present

## 2018-07-17 DIAGNOSIS — D509 Iron deficiency anemia, unspecified: Secondary | ICD-10-CM | POA: Diagnosis not present

## 2018-07-17 DIAGNOSIS — Z8711 Personal history of peptic ulcer disease: Secondary | ICD-10-CM | POA: Diagnosis not present

## 2018-07-17 DIAGNOSIS — E785 Hyperlipidemia, unspecified: Secondary | ICD-10-CM | POA: Diagnosis not present

## 2018-07-17 DIAGNOSIS — J449 Chronic obstructive pulmonary disease, unspecified: Secondary | ICD-10-CM | POA: Diagnosis not present

## 2018-07-17 DIAGNOSIS — Z885 Allergy status to narcotic agent status: Secondary | ICD-10-CM | POA: Diagnosis not present

## 2018-07-17 DIAGNOSIS — K2901 Acute gastritis with bleeding: Secondary | ICD-10-CM | POA: Diagnosis not present

## 2018-07-17 DIAGNOSIS — Z85828 Personal history of other malignant neoplasm of skin: Secondary | ICD-10-CM | POA: Diagnosis not present

## 2018-07-17 DIAGNOSIS — D62 Acute posthemorrhagic anemia: Secondary | ICD-10-CM | POA: Diagnosis not present

## 2018-07-17 DIAGNOSIS — Z888 Allergy status to other drugs, medicaments and biological substances status: Secondary | ICD-10-CM | POA: Diagnosis not present

## 2018-07-17 DIAGNOSIS — E119 Type 2 diabetes mellitus without complications: Secondary | ICD-10-CM | POA: Diagnosis not present

## 2018-07-17 DIAGNOSIS — E7800 Pure hypercholesterolemia, unspecified: Secondary | ICD-10-CM | POA: Diagnosis present

## 2018-07-17 DIAGNOSIS — Z6835 Body mass index (BMI) 35.0-35.9, adult: Secondary | ICD-10-CM | POA: Diagnosis not present

## 2018-07-17 DIAGNOSIS — I10 Essential (primary) hypertension: Secondary | ICD-10-CM | POA: Diagnosis not present

## 2018-07-17 DIAGNOSIS — Z794 Long term (current) use of insulin: Secondary | ICD-10-CM | POA: Diagnosis not present

## 2018-07-17 DIAGNOSIS — I2510 Atherosclerotic heart disease of native coronary artery without angina pectoris: Secondary | ICD-10-CM | POA: Diagnosis not present

## 2018-07-17 DIAGNOSIS — K219 Gastro-esophageal reflux disease without esophagitis: Secondary | ICD-10-CM | POA: Diagnosis not present

## 2018-07-17 DIAGNOSIS — Z886 Allergy status to analgesic agent status: Secondary | ICD-10-CM | POA: Diagnosis not present

## 2018-07-17 DIAGNOSIS — R079 Chest pain, unspecified: Secondary | ICD-10-CM | POA: Diagnosis not present

## 2018-07-17 DIAGNOSIS — Z880 Allergy status to penicillin: Secondary | ICD-10-CM | POA: Diagnosis not present

## 2018-07-17 DIAGNOSIS — Z87891 Personal history of nicotine dependence: Secondary | ICD-10-CM | POA: Diagnosis not present

## 2018-07-17 HISTORY — PX: ESOPHAGOGASTRODUODENOSCOPY (EGD) WITH PROPOFOL: SHX5813

## 2018-07-17 LAB — CBC
HCT: 26.9 % — ABNORMAL LOW (ref 35.0–47.0)
Hemoglobin: 9.2 g/dL — ABNORMAL LOW (ref 12.0–16.0)
MCH: 27.8 pg (ref 26.0–34.0)
MCHC: 34.2 g/dL (ref 32.0–36.0)
MCV: 81.5 fL (ref 80.0–100.0)
PLATELETS: 280 10*3/uL (ref 150–440)
RBC: 3.3 MIL/uL — AB (ref 3.80–5.20)
RDW: 15.3 % — ABNORMAL HIGH (ref 11.5–14.5)
WBC: 5.2 10*3/uL (ref 3.6–11.0)

## 2018-07-17 LAB — BASIC METABOLIC PANEL
Anion gap: 5 (ref 5–15)
BUN: 22 mg/dL (ref 8–23)
CHLORIDE: 110 mmol/L (ref 98–111)
CO2: 27 mmol/L (ref 22–32)
CREATININE: 0.83 mg/dL (ref 0.44–1.00)
Calcium: 8.4 mg/dL — ABNORMAL LOW (ref 8.9–10.3)
GFR calc non Af Amer: >60 mL/min (ref >60)
Glucose, Bld: 83 mg/dL (ref 70–99)
Potassium: 4.2 mmol/L (ref 3.5–5.1)
SODIUM: 142 mmol/L (ref 135–145)

## 2018-07-17 LAB — GLUCOSE, CAPILLARY
GLUCOSE-CAPILLARY: 168 mg/dL — AB (ref 70–99)
GLUCOSE-CAPILLARY: 111 mg/dL — AB (ref 70–99)
GLUCOSE-CAPILLARY: 80 mg/dL (ref 70–99)
Glucose-Capillary: 87 mg/dL (ref 70–99)

## 2018-07-17 LAB — TROPONIN I

## 2018-07-17 SURGERY — ESOPHAGOGASTRODUODENOSCOPY (EGD) WITH PROPOFOL
Anesthesia: General

## 2018-07-17 MED ORDER — TIOTROPIUM BROMIDE MONOHYDRATE 18 MCG IN CAPS
18.0000 ug | ORAL_CAPSULE | Freq: Every day | RESPIRATORY_TRACT | Status: DC
  Administered 2018-07-17 – 2018-07-18 (×2): 18 ug via RESPIRATORY_TRACT
  Filled 2018-07-17: qty 5

## 2018-07-17 MED ORDER — CERAVE EX CREA: 1.0000 "application " | TOPICAL_CREAM | Freq: Every day | CUTANEOUS | Status: DC

## 2018-07-17 MED ORDER — COLLAGEN 500 MG PO CAPS: ORAL_CAPSULE | Freq: Every day | ORAL | Status: DC

## 2018-07-17 MED ORDER — VITAMIN C 500 MG PO TABS
1000.0000 mg | ORAL_TABLET | Freq: Every day | ORAL | Status: DC
  Filled 2018-07-17: qty 2

## 2018-07-17 MED ORDER — PROPOFOL 500 MG/50ML IV EMUL
INTRAVENOUS | Status: DC | PRN
  Administered 2018-07-17: 100 ug/kg/min via INTRAVENOUS

## 2018-07-17 MED ORDER — DOCUSATE SODIUM 100 MG PO CAPS
100.0000 mg | ORAL_CAPSULE | Freq: Two times a day (BID) | ORAL | Status: DC
  Administered 2018-07-17 – 2018-07-18 (×2): 100 mg via ORAL
  Filled 2018-07-17 (×2): qty 1

## 2018-07-17 MED ORDER — GABAPENTIN 300 MG PO CAPS
300.0000 mg | ORAL_CAPSULE | Freq: Every day | ORAL | Status: DC
  Filled 2018-07-17: qty 1

## 2018-07-17 MED ORDER — BIOTIN 1000 MCG PO TABS: 1000.0000 ug | ORAL_TABLET | Freq: Every day | ORAL | Status: DC

## 2018-07-17 MED ORDER — IOPAMIDOL (ISOVUE-300) INJECTION 61%
100.0000 mL | Freq: Once | INTRAVENOUS | Status: AC | PRN
  Administered 2018-07-17: 100 mL via INTRAVENOUS

## 2018-07-17 MED ORDER — SODIUM CHLORIDE 0.9 % IV SOLN
INTRAVENOUS | Status: DC
  Administered 2018-07-17: 12:00:00 via INTRAVENOUS

## 2018-07-17 MED ORDER — INSULIN ASPART 100 UNIT/ML ~~LOC~~ SOLN: 0.0000 [IU] | Freq: Every day | SUBCUTANEOUS | Status: DC

## 2018-07-17 MED ORDER — LIDOCAINE HCL (PF) 2 % IJ SOLN
INTRAMUSCULAR | Status: AC
  Filled 2018-07-17: qty 10

## 2018-07-17 MED ORDER — ALENDRONATE SODIUM 70 MG PO TABS: 70.0000 mg | ORAL_TABLET | ORAL | Status: DC

## 2018-07-17 MED ORDER — METOPROLOL TARTRATE 50 MG PO TABS
100.0000 mg | ORAL_TABLET | Freq: Two times a day (BID) | ORAL | Status: DC
  Administered 2018-07-17: 100 mg via ORAL
  Filled 2018-07-17 (×2): qty 2

## 2018-07-17 MED ORDER — ALBUTEROL SULFATE (2.5 MG/3ML) 0.083% IN NEBU: 3.0000 mL | INHALATION_SOLUTION | RESPIRATORY_TRACT | Status: DC | PRN

## 2018-07-17 MED ORDER — OMEGA-3-ACID ETHYL ESTERS 1 G PO CAPS: 1.0000 g | ORAL_CAPSULE | Freq: Every day | ORAL | Status: DC

## 2018-07-17 MED ORDER — SERTRALINE HCL 100 MG PO TABS
100.0000 mg | ORAL_TABLET | Freq: Every day | ORAL | Status: DC
  Filled 2018-07-17: qty 1

## 2018-07-17 MED ORDER — BISACODYL 5 MG PO TBEC: 5.0000 mg | DELAYED_RELEASE_TABLET | Freq: Every day | ORAL | Status: DC | PRN

## 2018-07-17 MED ORDER — HEPARIN SODIUM (PORCINE) 5000 UNIT/ML IJ SOLN: 5000.0000 [IU] | Freq: Three times a day (TID) | INTRAMUSCULAR | Status: DC

## 2018-07-17 MED ORDER — INSULIN ASPART PROT & ASPART (70-30 MIX) 100 UNIT/ML ~~LOC~~ SUSP
25.0000 [IU] | Freq: Two times a day (BID) | SUBCUTANEOUS | Status: DC
  Administered 2018-07-17 (×2): 25 [IU] via SUBCUTANEOUS
  Filled 2018-07-17 (×2): qty 10

## 2018-07-17 MED ORDER — PRAVASTATIN SODIUM 10 MG PO TABS
10.0000 mg | ORAL_TABLET | Freq: Every day | ORAL | Status: DC
  Administered 2018-07-17: 10 mg via ORAL
  Filled 2018-07-17 (×2): qty 1

## 2018-07-17 MED ORDER — GLYCOPYRROLATE 0.2 MG/ML IJ SOLN
INTRAMUSCULAR | Status: DC | PRN
  Administered 2018-07-17: 0.1 mg via INTRAVENOUS

## 2018-07-17 MED ORDER — INSULIN ASPART 100 UNIT/ML ~~LOC~~ SOLN
0.0000 [IU] | Freq: Three times a day (TID) | SUBCUTANEOUS | Status: DC
  Administered 2018-07-17: 5 [IU] via SUBCUTANEOUS
  Administered 2018-07-18: 3 [IU] via SUBCUTANEOUS
  Filled 2018-07-17 (×2): qty 1

## 2018-07-17 MED ORDER — FLAXSEED OIL 1000 MG PO CAPS: 1200.0000 mg | ORAL_CAPSULE | Freq: Every day | ORAL | Status: DC

## 2018-07-17 MED ORDER — GLYCOPYRROLATE 0.2 MG/ML IJ SOLN
INTRAMUSCULAR | Status: AC
  Filled 2018-07-17: qty 1

## 2018-07-17 MED ORDER — PROPOFOL 10 MG/ML IV BOLUS
INTRAVENOUS | Status: DC | PRN
  Administered 2018-07-17: 100 mg via INTRAVENOUS

## 2018-07-17 MED ORDER — TRAZODONE HCL 50 MG PO TABS: 25.0000 mg | ORAL_TABLET | Freq: Every evening | ORAL | Status: DC | PRN

## 2018-07-17 MED ORDER — TRAMADOL HCL 50 MG PO TABS: 50.0000 mg | ORAL_TABLET | Freq: Four times a day (QID) | ORAL | Status: DC | PRN

## 2018-07-17 MED ORDER — TAB-A-VITE/IRON PO TABS
1.0000 | ORAL_TABLET | Freq: Every day | ORAL | Status: DC
  Administered 2018-07-18: 1 via ORAL
  Filled 2018-07-17 (×2): qty 1

## 2018-07-17 MED ORDER — AMITRIPTYLINE HCL 25 MG PO TABS
25.0000 mg | ORAL_TABLET | Freq: Every day | ORAL | Status: DC
  Administered 2018-07-17: 25 mg via ORAL
  Filled 2018-07-17 (×2): qty 1

## 2018-07-17 MED ORDER — ONDANSETRON HCL 4 MG PO TABS: 4.0000 mg | ORAL_TABLET | Freq: Four times a day (QID) | ORAL | Status: DC | PRN

## 2018-07-17 MED ORDER — SUCRALFATE 1 G PO TABS
1.0000 g | ORAL_TABLET | Freq: Two times a day (BID) | ORAL | Status: DC
  Administered 2018-07-17 – 2018-07-18 (×2): 1 g via ORAL
  Filled 2018-07-17 (×2): qty 1

## 2018-07-17 MED ORDER — ONDANSETRON HCL 4 MG/2ML IJ SOLN: 4.0000 mg | Freq: Four times a day (QID) | INTRAMUSCULAR | Status: DC | PRN

## 2018-07-17 MED ORDER — PROPOFOL 500 MG/50ML IV EMUL
INTRAVENOUS | Status: AC
  Filled 2018-07-17: qty 50

## 2018-07-17 MED ORDER — LIDOCAINE HCL (CARDIAC) PF 100 MG/5ML IV SOSY
PREFILLED_SYRINGE | INTRAVENOUS | Status: DC | PRN
  Administered 2018-07-17: 100 mg via INTRAVENOUS

## 2018-07-17 NOTE — Anesthesia Post-op Follow-up Note (Signed)
Anesthesia QCDR form completed.        

## 2018-07-17 NOTE — ED Notes (Signed)
Admitting MD at bedside.

## 2018-07-17 NOTE — Transfer of Care (Signed)
Immediate Anesthesia Transfer of Care Note  Patient: Joyce Potter  Procedure(s) Performed: ESOPHAGOGASTRODUODENOSCOPY (EGD) WITH PROPOFOL (N/A )  Patient Location: PACU and Endoscopy Unit  Anesthesia Type:General  Level of Consciousness: drowsy and patient cooperative  Airway & Oxygen Therapy: Patient Spontanous Breathing  Post-op Assessment: Report given to RN, Post -op Vital signs reviewed and stable and Patient moving all extremities  Post vital signs: Reviewed and stable  Last Vitals:  Vitals Value Taken Time  BP 142/52 07/17/2018  3:11 PM  Temp 36.1 C 07/17/2018  3:10 PM  Pulse 69 07/17/2018  3:13 PM  Resp 15 07/17/2018  3:13 PM  SpO2 95 % 07/17/2018  3:13 PM  Vitals shown include unvalidated device data.  Last Pain:  Vitals:   07/17/18 1510  TempSrc: Tympanic  PainSc: 0-No pain         Complications: No apparent anesthesia complications

## 2018-07-17 NOTE — Progress Notes (Signed)
1.  Melena likely secondary to upper GI bleed Continue IV fluids, PRBC transfusion, cbc daily, CTA noted for probable fatty liver/malposition of bile, gastroenterology to see, n.p.o. except for meds for now, strict I&O monitoring  2.  Severe anemia Secondary to acute GI bleeding Stable Plan of care as stated above  3.  Chest pain, acute Resolved likely secondary to severe anemia Check troponin x 1 now  4.  Diabetes type 2 Stable on current regiment

## 2018-07-17 NOTE — Anesthesia Postprocedure Evaluation (Signed)
Anesthesia Post Note  Patient: JULIEANNA GERACI  Procedure(s) Performed: ESOPHAGOGASTRODUODENOSCOPY (EGD) WITH PROPOFOL (N/A )  Patient location during evaluation: Endoscopy Anesthesia Type: General Level of consciousness: awake and alert, oriented and patient cooperative Pain management: satisfactory to patient Vital Signs Assessment: post-procedure vital signs reviewed and stable Respiratory status: spontaneous breathing and respiratory function stable Cardiovascular status: blood pressure returned to baseline and stable Postop Assessment: no headache, no backache, patient able to bend at knees, no apparent nausea or vomiting, adequate PO intake and able to ambulate Anesthetic complications: no     Last Vitals:  Vitals:   07/17/18 1435 07/17/18 1510  BP: (!) 145/61 (!) 142/52  Pulse: 73   Resp: 18 16  Temp: (!) 36.3 C (!) 36.1 C  SpO2: 100%     Last Pain:  Vitals:   07/17/18 1510  TempSrc: Tympanic  PainSc: 0-No pain                 Cedric Denison H Kedric Bumgarner

## 2018-07-17 NOTE — Op Note (Signed)
[facility] Gastroenterology Patient Name: Joyce Potter Procedure Date: 07/17/2018 2:28 PM MRN: [id number] Account #: [id number] Date of Birth: [birth year] Admit Type: Inpatient Age: 72 Room: [facility] ENDO ROOM 4 Gender: Female Note Status: Finalized Procedure:            Upper GI endoscopy Indications:          Melena Providers:            Lucilla Lame MD, MD Medicines:            Propofol per Anesthesia Complications:        No immediate complications. Procedure:            Pre-Anesthesia Assessment:                       - Prior to the procedure, a History and Physical was                        performed, and patient medications and allergies were                        reviewed. The patient's tolerance of previous                        anesthesia was also reviewed. The risks and benefits of                        the procedure and the sedation options and risks were                        discussed with the patient. All questions were                        answered, and informed consent was obtained. Prior                        Anticoagulants: The patient has taken no previous                        anticoagulant or antiplatelet agents. ASA Grade                        Assessment: II - A patient with mild systemic disease.                        After reviewing the risks and benefits, the patient was                        deemed in satisfactory condition to undergo the                        procedure.                       After obtaining informed consent, the endoscope was                        passed under direct vision. Throughout the procedure,                        the patient's blood pressure, pulse, and  oxygen                        saturations were monitored continuously. The Endoscope                        was introduced through the mouth, and advanced to the                        fourth part of duodenum. The upper GI endoscopy was              accomplished without difficulty. The patient tolerated                        the procedure well. Findings:      The examined esophagus was normal.      Localized inflammation characterized by erosions was found in the       gastric antrum.      The examined duodenum was normal. Impression:           - Normal esophagus.                       - Gastritis.                       - Normal examined duodenum.                       - No specimens collected.                       - No fresh or old blood seen throughout the entire exam. Recommendation:       - Return patient to hospital ward for ongoing care.                       - Resume regular diet. Procedure Code(s):    --- Professional ---                       [phone], Esophagogastroduodenoscopy, flexible, transoral;                        diagnostic, including collection of specimen(s) by                        brushing or washing, when performed (separate procedure) Diagnosis Code(s):    --- Professional ---                       K92.1, Melena (includes Hematochezia)                       K29.70, Gastritis, unspecified, without bleeding CPT copyright 2017 American Medical Association. All rights reserved. The codes documented in this report are preliminary and upon coder review may  be revised to meet current compliance requirements. Lucilla Lame MD, MD 07/17/2018 3:10:10 PM This report has been signed electronically. Number of Addenda: 0 Note Initiated On: 07/17/2018 2:28 PM      [facility]

## 2018-07-17 NOTE — Anesthesia Preprocedure Evaluation (Signed)
Anesthesia Evaluation  Patient identified by MRN, date of birth, ID band Patient awake    Reviewed: Allergy & Precautions, NPO status , Patient's Chart, lab work & pertinent test results, reviewed documented beta blocker date and time   History of Anesthesia Complications Negative for: history of anesthetic complications  Airway Mallampati: III  TM Distance: >3 FB     Dental  (+) Edentulous Upper, Edentulous Lower, Upper Dentures, Lower Dentures   Pulmonary shortness of breath and with exertion, neg sleep apnea, COPD (mild), neg recent URI, former smoker,           Cardiovascular hypertension, Pt. on medications and Pt. on home beta blockers (-) angina+ CAD, + Cardiac Stents (placed in 2014) and + Peripheral Vascular Disease  (-) Past MI and (-) CABG + dysrhythmias (Currently in NSR) Atrial Fibrillation + Valvular Problems/Murmurs AS      Neuro/Psych neg Seizures CVA, No Residual Symptoms    GI/Hepatic Neg liver ROS, PUD, GERD  Medicated and Controlled,  Endo/Other  diabetes, Poorly Controlled, Type 2, Insulin DependentMorbid obesity  Renal/GU      Musculoskeletal   Abdominal   Peds  Hematology  (+) anemia ,   Anesthesia Other Findings Past Medical History: No date: Anemia     Comment:  iron deficiency No date: Aortic stenosis No date: Basal cell carcinoma No date: CAD (coronary artery disease)     Comment:  s/p Left circumflex stent in 2012 No date: Carotid stenosis No date: Cataract No date: High cholesterol No date: HTN (hypertension) No date: Hyperlipidemia No date: IDDM (insulin dependent diabetes mellitus) ([place]) No date: Multiple thyroid nodules     Comment:  Benign No date: Peptic ulcer disease No date: Retinal artery occlusion     Comment:  on left   Reproductive/Obstetrics                             Anesthesia Physical  Anesthesia Plan  ASA: III  Anesthesia Plan:  General   Post-op Pain Management:    Induction: Intravenous  PONV Risk Score and Plan: 3 and Propofol infusion and TIVA  Airway Management Planned: Nasal Cannula  Additional Equipment:   Intra-op Plan:   Post-operative Plan:   Informed Consent: I have reviewed the patients History and Physical, chart, labs and discussed the procedure including the risks, benefits and alternatives for the proposed anesthesia with the patient or authorized representative who has indicated his/her understanding and acceptance.   Dental advisory given  Plan Discussed with: CRNA, Anesthesiologist and Surgeon  Anesthesia Plan Comments:         Anesthesia Quick Evaluation

## 2018-07-17 NOTE — H&P (Signed)
[facility] [place] NAME: Joyce Potter    MR#:  [id number]  DATE OF BIRTH:  October 17, 1946  DATE OF ADMISSION:  07/16/2018  PRIMARY CARE PHYSICIAN: Maryland Pink, MD   REQUESTING/REFERRING PHYSICIAN:   CHIEF COMPLAINT:   Chief Complaint  Patient presents with  . Chest Pain    HISTORY OF PRESENT ILLNESS: Joyce Potter  is a 72 y.o. female with a known history of anemia of iron deficiency, CAD, hyperlipidemia, hypertension, diabetes and other comorbidities. Patient presented to emergency room for acute onset of chest tightness and shortness of breath with exertion going on for the past 24 to 48 hours, gradually getting worse.  Also noticed black stools going on for the past 2 days.  No abdominal pain.  No fever, chills, diaphoresis, nausea, vomiting, palpitations.  Patient was recently transfused for symptomatic anemia approximately 2 weeks ago.  She has had issues with GI bleed in the past several times.  She underwent extensive GI work-up without finding a cause for her bleeding. Blood test done emergency room are remarkable for low hemoglobin level at 6.7.  Blood sugar is 251.  The reminder of the CBC and CMP are grossly unremarkable.  Troponin level is less than 0.03. EKG, reviewed by myself shows normal sinus rhythm with heart rate at 81 beats per minute, no acute ischemic changes. Chest x-ray is negative for acute cardiopulmonary abnormalities. Patient is admitted for further evaluation and treatment.   PAST MEDICAL HISTORY:   Past Medical History:  Diagnosis Date  . Anemia    iron deficiency  . Aortic stenosis   . Basal cell carcinoma   . CAD (coronary artery disease)    s/p Left circumflex stent in 2012  . Carotid stenosis   . Cataract   . High cholesterol   . HTN (hypertension)   . Hyperlipidemia   . IDDM (insulin dependent diabetes mellitus) ([place])   . Multiple thyroid nodules    Benign  . Peptic ulcer disease   .  Retinal artery occlusion    on left    PAST SURGICAL HISTORY:  Past Surgical History:  Procedure Laterality Date  . ARTERY BIOPSY Left 11/19/2015   Procedure: BIOPSY TEMPORAL ARTERY;  Surgeon: Algernon Huxley, MD;  Location: [facility] ORS;  Service: Vascular;  Laterality: Left;  . BREAST BIOPSY Left 2002   core- neg  . CARDIAC CATHETERIZATION N/A 08/08/2015   Procedure: Right and Left Heart Cath and Coronary Angiography;  Surgeon: Teodoro Spray, MD;  Location: [place] CV LAB;  Service: Cardiovascular;  Laterality: N/A;  . CARDIAC CATHETERIZATION Bilateral 09/03/2016   Procedure: Right/Left Heart Cath and Coronary Angiography;  Surgeon: Teodoro Spray, MD;  Location: [place] CV LAB;  Service: Cardiovascular;  Laterality: Bilateral;  . CATARACT EXTRACTION W/ INTRAOCULAR LENS  IMPLANT, BILATERAL    . COLONOSCOPY     in 2013- internal hemorrhoids  . COLONOSCOPY WITH PROPOFOL N/A 07/07/2018   Procedure: COLONOSCOPY WITH PROPOFOL;  Surgeon: Manya Silvas, MD;  Location: [facility] ENDOSCOPY;  Service: Endoscopy;  Laterality: N/A;  . CORONARY ANGIOGRAPHY N/A 09/14/2017   Procedure: CORONARY ANGIOGRAPHY;  Surgeon: Teodoro Spray, MD;  Location: [place] CV LAB;  Service: Cardiovascular;  Laterality: N/A;  . CORONARY ANGIOPLASTY    . CORONARY STENT PLACEMENT    . ENDARTERECTOMY Left 10/01/2015   Procedure: ENDARTERECTOMY CAROTID;  Surgeon: Algernon Huxley, MD;  Location: [facility] ORS;  Service: Vascular;  Laterality: Left;  .  ESOPHAGOGASTRODUODENOSCOPY    . ESOPHAGOGASTRODUODENOSCOPY (EGD) WITH PROPOFOL N/A 05/31/2016   Procedure: ESOPHAGOGASTRODUODENOSCOPY (EGD) WITH PROPOFOL;  Surgeon: Manya Silvas, MD;  Location: [facility] ENDOSCOPY;  Service: Endoscopy;  Laterality: N/A;  . ESOPHAGOGASTRODUODENOSCOPY (EGD) WITH PROPOFOL N/A 07/07/2018   Procedure: ESOPHAGOGASTRODUODENOSCOPY (EGD) WITH PROPOFOL;  Surgeon: Manya Silvas, MD;  Location: [facility] ENDOSCOPY;  Service: Endoscopy;  Laterality: N/A;  . EYE  SURGERY    . GIVENS CAPSULE STUDY N/A 04/17/2013   Procedure: GIVENS CAPSULE STUDY;  Surgeon: Arta Silence, MD;  Location: [facility] ENDOSCOPY;  Service: Endoscopy;  Laterality: N/A;  patient ate breakfast at 7am   . PARTIAL HYSTERECTOMY    . RIGHT AND LEFT HEART CATH Bilateral 09/14/2017   Procedure: RIGHT AND LEFT HEART CATH;  Surgeon: Teodoro Spray, MD;  Location: [place] CV LAB;  Service: Cardiovascular;  Laterality: Bilateral;  . TRIGGER FINGER RELEASE      SOCIAL HISTORY:  Social History   Tobacco Use  . Smoking status: Former Smoker    Last attempt to quit: 02/15/1985    Years since quitting: 33.4  . Smokeless tobacco: Never Used  . Tobacco comment: quit about 31 years ago  Substance Use Topics  . Alcohol use: No    Alcohol/week: 0.0 oz    FAMILY HISTORY:  Family History  Problem Relation Age of Onset  . Uterine cancer Mother   . Breast cancer Mother 68  . Seizures Father   . Stroke Father   . Diabetes Father   . COPD Father   . Colon cancer Neg Hx     DRUG ALLERGIES:  Allergies  Allergen Reactions  . Aspirin Other (See Comments)    ulcers  . Invokamet [Canagliflozin-Metformin Hcl] Other (See Comments)    Yeast infection  . Sulfa Antibiotics Other (See Comments)    "Got Drunk"  . Lovastatin Rash  . Penicillins Rash    Has patient had a PCN reaction causing immediate rash, facial/tongue/throat swelling, SOB or lightheadedness with hypotension:Yes Has patient had a PCN reaction causing severe rash involving mucus membranes or skin necrosis:all over body Has patient had a PCN reaction that required hospitalization: No Has patient had a PCN reaction occurring within the last 10 years: No If all of the above answers are "NO", then may proceed with Cephalosporin use.   . Vicodin [Hydrocodone-Acetaminophen] Rash    REVIEW OF SYSTEMS:   CONSTITUTIONAL: No fever, but positive for fatigue and general weakness.  EYES: No blurred or double vision.  EARS, NOSE,  AND THROAT: No tinnitus or ear pain.  RESPIRATORY: Positive for shortness of breath.  No cough, wheezing or hemoptysis.  CARDIOVASCULAR: No chest pain, orthopnea, edema.  GASTROINTESTINAL: Positive for melena.  No nausea, vomiting, abdominal pain.  GENITOURINARY: No dysuria, hematuria.  ENDOCRINE: No polyuria, nocturia,  HEMATOLOGY: Positive for blood in stool. SKIN: No rash or lesion. MUSCULOSKELETAL: No joint pain or arthritis.   NEUROLOGIC: No focal weakness.  PSYCHIATRY: No anxiety or depression.   MEDICATIONS AT HOME:  Prior to Admission medications   Medication Sig Start Date End Date Taking? Authorizing Provider  albuterol (PROAIR HFA) 108 (90 Base) MCG/ACT inhaler Inhale 2 puffs into the lungs every 4 (four) hours as needed. For shortness of breath/wheezing. 02/16/16  Yes [provider]  alendronate (FOSAMAX) 70 MG tablet Take 70 mg by mouth once a week. Take with a full glass of water on an empty stomach. (Tuesdays)   Yes [provider]  amitriptyline (ELAVIL) 25 MG tablet  Take 25 mg by mouth at bedtime.  11/01/17 11/01/18 Yes [provider]  gabapentin (NEURONTIN) 300 MG capsule Take 300 mg by mouth at bedtime.  07/01/16 09/09/18 Yes [provider]  insulin NPH-regular Human (NOVOLIN 70/30) (70-30) 100 UNIT/ML injection Inject 27-50 Units into the skin 2 (two) times daily. 50 units in the morning, & 27 units in the afternoon. 07/16/16  Yes [provider]  losartan (COZAAR) 100 MG tablet Take 100 mg by mouth daily with breakfast.    Yes [provider]  metoprolol (LOPRESSOR) 100 MG tablet Take 100 mg by mouth 2 (two) times daily.   Yes [provider]  Multiple Vitamins-Iron (MULTIVITAMINS WITH IRON) TABS tablet Take 1 tablet by mouth daily.   Yes [provider]  omeprazole (PRILOSEC) 40 MG capsule Take 40 mg by mouth 2 (two) times daily.    Yes [provider]  pravastatin (PRAVACHOL) 10 MG tablet Take  1 tablet (10 mg total) by mouth daily at 6 PM. 09/29/15  Yes Mody, Sital, MD  sucralfate (CARAFATE) 1 g tablet DISSOLVE 1 TABLET IN 15ML OF WATER TO CREATE A SLURRY. TAKE BY MOUTH THREE TIMES DAILY BEFORE MEAL(S). 07/08/18  Yes [provider]  tiotropium (SPIRIVA HANDIHALER) 18 MCG inhalation capsule Place 18 mcg into inhaler and inhale daily.  09/22/17 09/22/18 Yes [provider]  traMADol (ULTRAM) 50 MG tablet Take 50 mg by mouth every 6 (six) hours as needed (for pain.).   Yes [provider]  Ascorbic Acid (VITAMIN C) 1000 MG tablet Take 1,000 mg by mouth daily.    [provider]  Biotin (RA BIOTIN) 1000 MCG tablet Take 1,000 mcg by mouth daily.    [provider]  COLLAGEN PO Take 15 mLs by mouth daily with breakfast. Mix with coffee    [provider]  Emollient (CERAVE) CREA Apply 1 application topically daily.    [provider]  Flaxseed, Linseed, (FLAXSEED OIL) 1000 MG CAPS Take 1,200 mg by mouth daily.     [provider]  Omega-3 Fatty Acids (FISH OIL) 500 MG CAPS Take 500 mg by mouth daily.    [provider]  OVER THE COUNTER MEDICATION Apply 1 application topically at bedtime. REVITALIFT    [provider]  sertraline (ZOLOFT) 100 MG tablet Take 100 mg by mouth at bedtime.     [provider]  TURMERIC PO Take 1,000 mg by mouth daily.    [provider]      PHYSICAL EXAMINATION:   VITAL SIGNS: Blood pressure (!) 162/56, pulse 80, temperature (!) 97.5 F (36.4 C), temperature source Oral, resp. rate 18, height 5\' 2"  (1.575 m), weight 91.2 kg (201 lb), SpO2 100 %.  GENERAL:  72 y.o.-year-old patient lying in the bed with no acute distress.  She looks pale exhausted. EYES: Pupils equal, round, reactive to light and accommodation. No scleral icterus. Extraocular muscles intact.  HEENT: Head atraumatic, normocephalic. Oropharynx and nasopharynx clear.  NECK:  Supple, no  jugular venous distention. No thyroid enlargement, no tenderness.  LUNGS: Normal breath sounds bilaterally, no wheezing, rales,rhonchi or crepitation. No use of accessory muscles of respiration.  CARDIOVASCULAR: S1, S2 normal. No S3/S4.  ABDOMEN: Soft, nontender, nondistended. Bowel sounds present. No organomegaly or mass.  EXTREMITIES: No pedal edema, cyanosis, or clubbing.  NEUROLOGIC: Cranial nerves II through XII are intact. Muscle strength 5/5 in all extremities. Sensation intact. PSYCHIATRIC: The patient is alert and oriented x 3.  SKIN:  No obvious rash, lesion, or ulcer.   LABORATORY PANEL:   CBC Recent Labs  Lab 07/16/18 2200  WBC 5.1  HGB 6.7*  HCT 20.6*  PLT 348  MCV 80.8  MCH 26.4  MCHC 32.7  RDW 16.4*   ------------------------------------------------------------------------------------------------------------------  Chemistries  Recent Labs  Lab 07/16/18 2200  NA 139  K 4.5  CL 105  CO2 26  GLUCOSE 251*  BUN 33*  CREATININE 0.98  CALCIUM 9.2  AST 22  ALT 14  ALKPHOS 65  BILITOT 0.4   ------------------------------------------------------------------------------------------------------------------ estimated creatinine clearance is 56.1 mL/min (by C-G formula based on SCr of 0.98 mg/dL). ------------------------------------------------------------------------------------------------------------------ No results for input(s): TSH, T4TOTAL, T3FREE, THYROIDAB in the last 72 hours.  Invalid input(s): FREET3   Coagulation profile No results for input(s): INR, PROTIME in the last 168 hours. ------------------------------------------------------------------------------------------------------------------- No results for input(s): DDIMER in the last 72 hours. -------------------------------------------------------------------------------------------------------------------  Cardiac Enzymes Recent Labs  Lab 07/16/18 2200  TROPONINI <0.03    ------------------------------------------------------------------------------------------------------------------ Invalid input(s): POCBNP  ---------------------------------------------------------------------------------------------------------------  Urinalysis    Component Value Date/Time   COLORURINE STRAW (A) 06/01/2016 0532   APPEARANCEUR CLEAR (A) 06/01/2016 0532   APPEARANCEUR Clear 05/03/2013 1702   LABSPEC 1.006 06/01/2016 0532   LABSPEC 1.006 05/03/2013 1702   PHURINE 7.0 06/01/2016 0532   GLUCOSEU NEGATIVE 06/01/2016 0532   GLUCOSEU Negative 05/03/2013 1702   HGBUR NEGATIVE 06/01/2016 0532   BILIRUBINUR NEGATIVE 06/01/2016 0532   BILIRUBINUR Negative 05/03/2013 1702   KETONESUR NEGATIVE 06/01/2016 0532   PROTEINUR NEGATIVE 06/01/2016 0532   NITRITE NEGATIVE 06/01/2016 0532   LEUKOCYTESUR NEGATIVE 06/01/2016 0532   LEUKOCYTESUR 1+ 05/03/2013 1702     RADIOLOGY: Dg Chest 2 View  Result Date: 07/16/2018 CLINICAL DATA:  Chest pain starting today in worse throughout the day. Shortness of breath on ambulation. History of anemia. EXAM: CHEST - 2 VIEW COMPARISON:  09/06/2016 FINDINGS: Borderline heart size with normal pulmonary vascularity. Mild linear fibrosis or atelectasis in the lung bases is similar to previous study. No focal consolidation. No blunting of costophrenic angles. No pneumothorax. Mediastinal contours appear intact. Degenerative changes in the spine. IMPRESSION: Borderline heart size. Mild linear fibrosis or atelectasis in the lung bases. No evidence of active pulmonary disease. Electronically Signed   By: Lucienne Capers M.D.   On: 07/16/2018 22:50    EKG: Orders placed or performed during the hospital encounter of 07/16/18  . EKG 12-Lead  . EKG 12-Lead  . ED EKG within 10 minutes  . ED EKG within 10 minutes    IMPRESSION AND PLAN:  1.  Melena, likely secondary to upper GI bleed.  We will start IV Protonix, IV fluids and blood transfusion.  Will  check CT scan of the abdomen for further evaluation.  Gastroenterology is consulted as well for further evaluation and treatment. 2.  Severe anemia, with hemoglobin level at 6.7, secondary to GI bleed.  We will transfuse 2 units packed RBC.  Continue to monitor hemoglobin level closely.  Patient was advised to discontinue turmeric and avoid NSAIDs, aspirin, or any other blood thinners. 3.  Chest pain, associated with shortness of breath, with exertion, likely secondary to severe anemia.  We will continue to monitor patient on telemetry and follow troponin level to rule out ACS.  Will monitor clinically closely while treating the anemia and GI bleed. 4.  Diabetes type 2.  Will monitor blood sugars before meals and at bedtime and use insulin treatment during the hospital stay.   All the records are  reviewed and case discussed with ED provider. Management plans discussed with the patient, who is in agreement.  CODE STATUS: Full Code Status History    Date Active Date Inactive Code Status Order ID Comments User Context   09/14/2017 0931 09/14/2017 1349 Full Code [id number]  Teodoro Spray, MD Inpatient   09/14/2017 0930 09/14/2017 0931 Full Code [id number]  Teodoro Spray, MD Inpatient   09/03/2016 0927 09/03/2016 1401 Full Code [id number]  Teodoro Spray, MD Inpatient   05/29/2016 1652 06/01/2016 1832 Full Code [id number]  Gladstone Lighter, MD Inpatient   10/01/2015 1512 10/02/2015 1803 Full Code [id number]  Algernon Huxley, MD Inpatient   09/28/2015 1553 10/01/2015 1512 Full Code [id number]  Loletha Grayer, MD ED   04/14/2013 0202 04/18/2013 1655 Full Code [id number]  Etta Quill., DO ED       TOTAL TIME TAKING CARE OF THIS PATIENT: 45 minutes.    Amelia Jo M.D on 07/17/2018 at 1:46 AM  Between 7am to 6pm - Pager - [phone]  After 6pm go to [email or web address] - password [email or web address] [facility]  [place] [facility]  Office  [phone]  CC: Primary care physician; [place] Pink, MD

## 2018-07-17 NOTE — ED Notes (Signed)
Floor called to inform pt is leaving the ED now

## 2018-07-17 NOTE — Progress Notes (Signed)
PHARMACIST - PHYSICIAN ORDER COMMUNICATION  CONCERNING: P&T Medication Policy on Herbal Medications  DESCRIPTION:  This patient's order for:  Biotin, Collagen, and Flaxseed Oil  has been noted.  This product(s) is classified as an "herbal" or natural product. Due to a lack of definitive safety studies or FDA approval, nonstandard manufacturing practices, plus the potential risk of unknown drug-drug interactions while on inpatient medications, the Pharmacy and Therapeutics Committee does not permit the use of "herbal" or natural products of this type within [facility].   ACTION TAKEN: The pharmacy department is unable to verify this order at this time and your patient has been informed of this safety policy. Please reevaluate patient's clinical condition at discharge and address if the herbal or natural product(s) should be resumed at that time.  Paulina Fusi, PharmD, BCPS 07/17/2018 2:56 AM

## 2018-07-17 NOTE — ED Notes (Signed)
Transport to floor room 201.AS

## 2018-07-17 NOTE — Consult Note (Signed)
Joyce Lame, MD [facility]  [address], [place], [place] [address] Phone: [phone] Fax : [phone]  Consultation  Referring Provider:     Dr. Jerelyn Charles Primary Care Physician:  [place] Pink, MD Primary Gastroenterologist:  Dr. Vira Agar         Reason for Consultation:     Melena and anemia  Date of Admission:  07/16/2018 Date of Consultation:  07/17/2018         HPI:   Joyce Potter is a 72 y.o. female who has a history of iron deficiency anemia.  The patient has been getting IV iron and reports that she has had dark stools over last year.  The patient states that it has been loose recently.  The patient has had multiple colonoscopies multiple EGDs and reports that she also has had multiple capsule endoscopies without any source of the bleeding to be found.  The patient's blood count 2 months ago was 10.2 and she reports an outside lab checked and it was in the 9 range.  The patient came in with a hemoglobin of 6.7.  The patient's hemoglobin back in April of this year was 11.4.  The patient denies taking any anti-inflammatory medications.  She also denies taking any blood thinners.  Besides Dr. Vira Agar the patient also reports that in 2014 when she started to have anemia she was seen by Dr. Paulita Fujita in [place].  The patient denies any abdominal pain nausea vomiting fevers or chills.  She does report feeling weak and tired.  The patient's last procedures were done approximately 10 days ago with a EGD and colonoscopy at that time.  The patient found to have a colon polyp in the transverse colon and a descending colon polyp.  The transverse colon polyp was a tubular adenoma and the descending colon polyp was a hyperplastic polyp.  The patient did have a upper endoscopy which showed gastritis also.  The patient's last bowel movement was this morning and she reports that it was black diarrhea.  Past Medical History:  Diagnosis Date  . Anemia    iron deficiency  . Aortic stenosis     . Basal cell carcinoma   . CAD (coronary artery disease)    s/p Left circumflex stent in 2012  . Carotid stenosis   . Cataract   . High cholesterol   . HTN (hypertension)   . Hyperlipidemia   . IDDM (insulin dependent diabetes mellitus) ([place])   . Multiple thyroid nodules    Benign  . Peptic ulcer disease   . Retinal artery occlusion    on left    Past Surgical History:  Procedure Laterality Date  . ARTERY BIOPSY Left 11/19/2015   Procedure: BIOPSY TEMPORAL ARTERY;  Surgeon: Algernon Huxley, MD;  Location: [facility] ORS;  Service: Vascular;  Laterality: Left;  . BREAST BIOPSY Left 2002   core- neg  . CARDIAC CATHETERIZATION N/A 08/08/2015   Procedure: Right and Left Heart Cath and Coronary Angiography;  Surgeon: Teodoro Spray, MD;  Location: [place] [facility];  Service: Cardiovascular;  Laterality: N/A;  . CARDIAC CATHETERIZATION Bilateral 09/03/2016   Procedure: Right/Left Heart Cath and Coronary Angiography;  Surgeon: Teodoro Spray, MD;  Location: [place] [facility];  Service: Cardiovascular;  Laterality: Bilateral;  . CATARACT EXTRACTION W/ INTRAOCULAR LENS  IMPLANT, BILATERAL    . COLONOSCOPY     in 2013- internal hemorrhoids  . COLONOSCOPY WITH PROPOFOL N/A 07/07/2018   Procedure: COLONOSCOPY WITH PROPOFOL;  Surgeon: Manya Silvas, MD;  Location: [facility] ENDOSCOPY;  Service: Endoscopy;  Laterality: N/A;  . CORONARY ANGIOGRAPHY N/A 09/14/2017   Procedure: CORONARY ANGIOGRAPHY;  Surgeon: Teodoro Spray, MD;  Location: [place] [facility];  Service: Cardiovascular;  Laterality: N/A;  . CORONARY ANGIOPLASTY    . CORONARY STENT PLACEMENT    . ENDARTERECTOMY Left 10/01/2015   Procedure: ENDARTERECTOMY CAROTID;  Surgeon: Algernon Huxley, MD;  Location: [facility] ORS;  Service: Vascular;  Laterality: Left;  . ESOPHAGOGASTRODUODENOSCOPY    . ESOPHAGOGASTRODUODENOSCOPY (EGD) WITH PROPOFOL N/A 05/31/2016   Procedure: ESOPHAGOGASTRODUODENOSCOPY (EGD) WITH PROPOFOL;  Surgeon: Manya Silvas, MD;   Location[facility] ENDOSCOPY;  Service: Endoscopy;  Laterality: N/A;  . ESOPHAGOGASTRODUODENOSCOPY (EGD) WITH PROPOFOL N/A 07/07/2018   Procedure: ESOPHAGOGASTRODUODENOSCOPY (EGD) WITH PROPOFOL;  Surgeon: Manya Silvas, MD;  Location: [facility]COPY;  Service: Endoscopy;  Laterality: N/A;  . EYE SURGERY    . GIVENS CAPSULE STUDY N/A 04/17/2013   Procedure: GIVENS CAPSULE STUDY;  Surgeon: Arta Silence, MD;  Location: [facility] ENDOSCOPY;  Service: Endoscopy;  Laterality: N/A;  patient ate breakfast at 7am   . PARTIAL HYSTERECTOMY    . RIGHT AND LEFT HEART CATH Bilateral 09/14/2017   Procedure: RIGHT AND LEFT HEART CATH;  Surgeon: Teodoro Spray, MD;  Location: [place] [facility];  Service: Cardiovascular;  Laterality: Bilateral;  . TRIGGER FINGER RELEASE      Prior to Admission medications   Medication Sig Start Date End Date Taking? Authorizing Provider  albuterol (PROAIR HFA) 108 (90 Base) MCG/ACT inhaler Inhale 2 puffs into the lungs every 4 (four) hours as needed. For shortness of breath/wheezing. 02/16/16  Yes [provider]  alendronate (FOSAMAX) 70 MG tablet Take 70 mg by mouth once a week. Take with a full glass of water on an empty stomach. (Tuesdays)   Yes [provider]  amitriptyline (ELAVIL) 25 MG tablet Take 25 mg by mouth at bedtime.  11/01/17 11/01/18 Yes [provider]  gabapentin (NEURONTIN) 300 MG capsule Take 300 mg by mouth at bedtime.  07/01/16 09/09/18 Yes [provider]  insulin NPH-regular Human (NOVOLIN 70/30) (70-30) 100 UNIT/ML injection Inject 27-50 Units into the skin 2 (two) times daily. 50 units in the morning, & 27 units in the afternoon. 07/16/16  Yes [provider]  losartan (COZAAR) 100 MG tablet Take 100 mg by mouth daily with breakfast.    Yes [provider]  metoprolol (LOPRESSOR) 100 MG tablet Take 100 mg by mouth 2 (two) times daily.   Yes [provider]  Multiple Vitamins-Iron (MULTIVITAMINS WITH  IRON) TABS tablet Take 1 tablet by mouth daily.   Yes [provider]  omeprazole (PRILOSEC) 40 MG capsule Take 40 mg by mouth 2 (two) times daily.    Yes [provider]  pravastatin (PRAVACHOL) 10 MG tablet Take 1 tablet (10 mg total) by mouth daily at 6 PM. 09/29/15  Yes Mody, Sital, MD  sucralfate (CARAFATE) 1 g tablet DISSOLVE 1 TABLET IN 15ML OF WATER TO CREATE A SLURRY. TAKE BY MOUTH THREE TIMES DAILY BEFORE MEAL(S). 07/08/18  Yes [provider]  tiotropium (SPIRIVA HANDIHALER) 18 MCG inhalation capsule Place 18 mcg into inhaler and inhale daily.  09/22/17 09/22/18 Yes [provider]  traMADol (ULTRAM) 50 MG tablet Take 50 mg by mouth every 6 (six) hours as needed (for pain.).   Yes [provider]  Ascorbic Acid (VITAMIN C) 1000 MG tablet Take 1,000 mg by mouth daily.  [provider]  Biotin (RA BIOTIN) 1000 MCG tablet Take 1,000 mcg by mouth daily.    [provider]  COLLAGEN PO Take 15 mLs by mouth daily with breakfast. Mix with coffee    [provider]  Emollient (CERAVE) CREA Apply 1 application topically daily.    [provider]  Flaxseed, Linseed, (FLAXSEED OIL) 1000 MG CAPS Take 1,200 mg by mouth daily.     [provider]  Omega-3 Fatty Acids (FISH OIL) 500 MG CAPS Take 500 mg by mouth daily.    [provider]  OVER THE COUNTER MEDICATION Apply 1 application topically at bedtime. REVITALIFT    [provider]  sertraline (ZOLOFT) 100 MG tablet Take 100 mg by mouth at bedtime.     [provider]  TURMERIC PO Take 1,000 mg by mouth daily.    [provider]    Family History  Problem Relation Age of Onset  . Uterine cancer Mother   . Breast cancer Mother 44  . Seizures Father   . Stroke Father   . Diabetes Father   . COPD Father   . Colon cancer Neg Hx      Social History   Tobacco Use  . Smoking status: Former Smoker    Last attempt to  quit: 02/15/1985    Years since quitting: 33.4  . Smokeless tobacco: Never Used  . Tobacco comment: quit about 31 years ago  Substance Use Topics  . Alcohol use: No    Alcohol/week: 0.0 oz  . Drug use: No    Allergies as of 07/16/2018 - Review Complete 07/16/2018  Allergen Reaction Noted  . Aspirin Other (See Comments) 09/28/2015  . Invokamet [canagliflozin-metformin hcl] Other (See Comments) 08/08/2015  . Sulfa antibiotics Other (See Comments) 04/13/2013  . Lovastatin Rash 08/08/2015  . Penicillins Rash 04/13/2013  . Vicodin [hydrocodone-acetaminophen] Rash 04/13/2013    Review of Systems:    All systems reviewed and negative except where noted in HPI.   Physical Exam:  Vital signs in last 24 hours: Temp:  [97.4 F (36.3 C)-98.8 F (37.1 C)] 97.4 F (36.3 C) (07/22 1435) Pulse Rate:  [63-85] 73 (07/22 1435) Resp:  [15-20] 18 (07/22 1435) BP: (122-171)/(45-74) 145/61 (07/22 1435) SpO2:  [96 %-100 %] 100 % (07/22 1435) Weight:  [197 lb (89.4 kg)-201 lb (91.2 kg)] 197 lb (89.4 kg) (07/22 1435) Last BM Date: 07/17/18 General:   Pleasant, cooperative in NAD Head:  Normocephalic and atraumatic. Eyes:   No icterus.   Conjunctiva pink. PERRLA. Ears:  Normal auditory acuity. Neck:  Supple; no masses or thyroidomegaly Lungs: Respirations even and unlabored. Lungs clear to auscultation bilaterally.   No wheezes, crackles, or rhonchi.  Heart:  Regular rate and rhythm;  Without murmur, clicks, rubs or gallops Abdomen:  Soft, nondistended, nontender. Normal bowel sounds. No appreciable masses or hepatomegaly.  No rebound or guarding.  Rectal:  Not performed. Msk:  Symmetrical without gross deformities.   Extremities:  Without edema, cyanosis or clubbing. Neurologic:  Alert and oriented x3;  grossly normal neurologically. Skin:  Intact without significant lesions or rashes. Cervical Nodes:  No significant cervical adenopathy. Psych:  Alert and cooperative. Normal affect.  LAB  RESULTS: Recent Labs    07/16/18 2200  WBC 5.1  HGB 6.7*  HCT 20.6*  PLT 348   BMET Recent Labs    07/16/18 2200  NA 139  K 4.5  CL 105  CO2 26  GLUCOSE 251*  BUN  33*  CREATININE 0.98  CALCIUM 9.2   LFT Recent Labs    07/16/18 2200  PROT 7.0  ALBUMIN 3.8  AST 22  ALT 14  ALKPHOS 65  BILITOT 0.4   PT/INR No results for input(s): LABPROT, INR in the last 72 hours.  STUDIES: Dg Chest 2 View  Result Date: 07/16/2018 CLINICAL DATA:  Chest pain starting today in worse throughout the day. Shortness of breath on ambulation. History of anemia. EXAM: CHEST - 2 VIEW COMPARISON:  09/06/2016 FINDINGS: Borderline heart size with normal pulmonary vascularity. Mild linear fibrosis or atelectasis in the lung bases is similar to previous study. No focal consolidation. No blunting of costophrenic angles. No pneumothorax. Mediastinal contours appear intact. Degenerative changes in the spine. IMPRESSION: Borderline heart size. Mild linear fibrosis or atelectasis in the lung bases. No evidence of active pulmonary disease. Electronically Signed   By: Lucienne Capers M.D.   On: 07/16/2018 22:50   Ct Abdomen Pelvis W Contrast  Result Date: 07/17/2018 CLINICAL DATA:  72 year old female with GI bleed. History of bleeding ulcer. Gastritis noted on 07/07/2018 endoscopy. Small colonic polyps noted and removed during colonoscopy 07/07/2018 (negative for malignancy). Post hysterectomy. Initial encounter. EXAM: CT ABDOMEN AND PELVIS WITH CONTRAST TECHNIQUE: Multidetector CT imaging of the abdomen and pelvis was performed using the standard protocol following bolus administration of intravenous contrast. CONTRAST:  169mL ISOVUE-300 IOPAMIDOL (ISOVUE-300) INJECTION 61% COMPARISON:  None. FINDINGS: Lower chest: Minimal basilar atelectasis/scarring. Top-normal heart size. Aortic mitral valve calcification. Coronary artery calcification. Hepatobiliary: Enlarged liver spanning over 18.3 cm. No worrisome hepatic  lesion. Partially contracted gallbladder. No calcified gallstone or common bile duct stone. Pancreas: No worrisome pancreatic mass or inflammation. Spleen: No splenic mass or enlargement. Adrenals/Urinary Tract: No obstructing stone or hydronephrosis. No worrisome renal or adrenal lesion. Noncontrast filled views the urinary bladder without gross abnormality. Stomach/Bowel: Malposition bowel with the ligament of Treitz to the right of midline and cecum displaced medially and superiorly with the appendix to left of midline draping toward central aspect. Scattered diverticula without evidence of diverticulitis. Stomach under distended and evaluation limited. Vascular/Lymphatic: Atherosclerotic changes aorta and aortic branch vessels. No abdominal aortic aneurysm or large vessel occlusion. The superior mesenteric artery is located slightly posteriorly with respect to the superior mesenteric vein. Scattered small lymph nodes without adenopathy. Reproductive: Post hysterectomy.  Coarse calcifications left adnexa. Other: No free air or bowel containing hernia. Musculoskeletal: Degenerative changes thoracic and lumbar spine with mild scoliosis lumbar spine convex right. IMPRESSION: Malposition bowel with the ligament of Treitz to the right of midline and cecum displaced medially and superiorly. Scattered diverticula without evidence of diverticulitis. Stomach under distended and evaluation limited. Aortic Atherosclerosis (ICD10-I70.0). Post hysterectomy. Coarse calcification left adnexa of indeterminate etiology or significance. Enlarged liver spanning over 18.3 cm possibly with fatty infiltration. Partially contracted gallbladder without calcified gallstone. Electronically Signed   By: Genia Del M.D.   On: 07/17/2018 08:14      Impression / Plan:   Assessment: Active Problems:   GIB (gastrointestinal bleeding)   Melena   Symptomatic anemia   Joyce AHART is a 72 y.o. y/o female with with a history of  iron deficiency anemia since 2014 with an extensive work-up who now comes in with black stools and she reports them to be black diarrhea.  The patient has also had a significant drop in her hemoglobin.  The patient's last EGD and colonoscopy were approximately 10 days ago with gastritis and colon polyps found.  Plan:  Since the patient was admitted with a profound drop in hemoglobin and black stools the patient will be set up for an EGD for today. I have discussed risks & benefits which include, but are not limited to, bleeding, infection, perforation & drug reaction.  The patient agrees with this plan & written consent will be obtained.     Thank you for involving me in the care of this patient.      LOS: 0 days   Joyce Lame, MD  07/17/2018, 3:11 PM    Note: This dictation was prepared with Dragon dictation along with smaller phrase technology. Any transcriptional errors that result from this process are unintentional.

## 2018-07-18 ENCOUNTER — Encounter: Payer: Self-pay | Admitting: Gastroenterology

## 2018-07-18 LAB — GLUCOSE, CAPILLARY
GLUCOSE-CAPILLARY: 159 mg/dL — AB (ref 70–99)
Glucose-Capillary: 95 mg/dL (ref 70–99)

## 2018-07-18 LAB — CBC
HCT: 26.1 % — ABNORMAL LOW (ref 35.0–47.0)
Hemoglobin: 8.8 g/dL — ABNORMAL LOW (ref 12.0–16.0)
MCH: 27.7 pg (ref 26.0–34.0)
MCHC: 33.7 g/dL (ref 32.0–36.0)
MCV: 82 fL (ref 80.0–100.0)
Platelets: 261 10*3/uL (ref 150–440)
RBC: 3.18 MIL/uL — AB (ref 3.80–5.20)
RDW: 15.7 % — AB (ref 11.5–14.5)
WBC: 5.5 10*3/uL (ref 3.6–11.0)

## 2018-07-18 LAB — TYPE AND SCREEN
ABO/RH(D): O POS
ANTIBODY SCREEN: NEGATIVE
Unit division: 0
Unit division: 0

## 2018-07-18 LAB — BASIC METABOLIC PANEL
ANION GAP: 4 — AB (ref 5–15)
BUN: 18 mg/dL (ref 8–23)
CALCIUM: 8.4 mg/dL — AB (ref 8.9–10.3)
CO2: 27 mmol/L (ref 22–32)
Chloride: 110 mmol/L (ref 98–111)
Creatinine, Ser: 0.98 mg/dL (ref 0.44–1.00)
GFR, EST NON AFRICAN AMERICAN: 57 mL/min — AB (ref >60)
Glucose, Bld: 97 mg/dL (ref 70–99)
POTASSIUM: 4.2 mmol/L (ref 3.5–5.1)
Sodium: 141 mmol/L (ref 135–145)

## 2018-07-18 LAB — HIV ANTIBODY (ROUTINE TESTING W REFLEX): HIV Screen 4th Generation wRfx: NONREACTIVE

## 2018-07-18 LAB — BPAM RBC
BLOOD PRODUCT EXPIRATION DATE: 201907292359
Blood Product Expiration Date: 201907292359
ISSUE DATE / TIME: 201907220259
ISSUE DATE / TIME: 201907220833
Unit Type and Rh: 9500
Unit Type and Rh: 9500

## 2018-07-18 LAB — PREPARE RBC (CROSSMATCH)

## 2018-07-18 MED ORDER — METOPROLOL TARTRATE 50 MG PO TABS: 100.0000 mg | ORAL_TABLET | Freq: Two times a day (BID) | ORAL | 0 refills | Status: DC

## 2018-07-18 MED ORDER — LOSARTAN POTASSIUM 50 MG PO TABS: 50.0000 mg | ORAL_TABLET | Freq: Every day | ORAL | 0 refills | Status: DC

## 2018-07-18 NOTE — Progress Notes (Signed)
Pt was given D/C instructions and her vitals were WDL. I removed her IV without incident and she was released to her husband.

## 2018-07-18 NOTE — Discharge Summary (Signed)
[facility] [place] NAME: Joyce Potter    MR#:  [id number]  DATE OF BIRTH:  1946/10/15  DATE OF ADMISSION:  07/16/2018 ADMITTING PHYSICIAN: Amelia Jo, MD  DATE OF DISCHARGE: No discharge date for patient encounter.  PRIMARY CARE PHYSICIAN: Maryland Pink, MD    ADMISSION DIAGNOSIS:  Melena [K92.1] Gastrointestinal hemorrhage associated with acute gastritis [K29.01] Symptomatic anemia [D64.9]  DISCHARGE DIAGNOSIS:  Active Problems:   GIB (gastrointestinal bleeding)   Melena   Symptomatic anemia   SECONDARY DIAGNOSIS:   Past Medical History:  Diagnosis Date  . Anemia    iron deficiency  . Aortic stenosis   . Basal cell carcinoma   . CAD (coronary artery disease)    s/p Left circumflex stent in 2012  . Carotid stenosis   . Cataract   . High cholesterol   . HTN (hypertension)   . Hyperlipidemia   . IDDM (insulin dependent diabetes mellitus) ([place])   . Multiple thyroid nodules    Benign  . Peptic ulcer disease   . Retinal artery occlusion    on left    HOSPITAL COURSE:  *Melena, acute Resolved Treated with IV fluids, PRBC transfusion, Protonix drip, CT angiogram noted for probable fatty liver/malposition of bile, gastroenterology to see patient while in house-underwent EGD noted for gastritis on July 17, 2018, hemoglobin stable at discharge, patient did well, and will plan to follow-up with her gastroenterology status post discharge 1 to 2 weeks for reevaluation  *Acute severe symptomatic anemia Resolved status post transfusion Secondary to acute GI bleeding  *Chest pain, acute Resolved likely secondary to severe anemia  *Diabetes type 2 Stable on current regiment  DISCHARGE CONDITIONS:   stable  CONSULTS OBTAINED:  Treatment Team:  Lucilla Lame, MD  DRUG ALLERGIES:   Allergies  Allergen Reactions  . Aspirin Other (See Comments)    ulcers  . Invokamet [Canagliflozin-Metformin Hcl] Other  (See Comments)    Yeast infection  . Sulfa Antibiotics Other (See Comments)    "Got Drunk"  . Lovastatin Rash  . Penicillins Rash    Has patient had a PCN reaction causing immediate rash, facial/tongue/throat swelling, SOB or lightheadedness with hypotension:Yes Has patient had a PCN reaction causing severe rash involving mucus membranes or skin necrosis:all over body Has patient had a PCN reaction that required hospitalization: No Has patient had a PCN reaction occurring within the last 10 years: No If all of the above answers are "NO", then may proceed with Cephalosporin use.   . Vicodin [Hydrocodone-Acetaminophen] Rash    DISCHARGE MEDICATIONS:   Allergies as of 07/18/2018      Reactions   Aspirin Other (See Comments)   ulcers   Invokamet [canagliflozin-metformin Hcl] Other (See Comments)   Yeast infection   Sulfa Antibiotics Other (See Comments)   "Got Drunk"   Lovastatin Rash   Penicillins Rash   Has patient had a PCN reaction causing immediate rash, facial/tongue/throat swelling, SOB or lightheadedness with hypotension:Yes Has patient had a PCN reaction causing severe rash involving mucus membranes or skin necrosis:all over body Has patient had a PCN reaction that required hospitalization: No Has patient had a PCN reaction occurring within the last 10 years: No If all of the above answers are "NO", then may proceed with Cephalosporin use.   Vicodin [hydrocodone-acetaminophen] Rash      Medication List    TAKE these medications   alendronate 70 MG tablet Commonly known as:  FOSAMAX Take 70 mg  by mouth once a week. Take with a full glass of water on an empty stomach. (Tuesdays)   amitriptyline 25 MG tablet Commonly known as:  ELAVIL Take 25 mg by mouth at bedtime.   CERAVE Crea Apply 1 application topically daily.   COLLAGEN PO Take 15 mLs by mouth daily with breakfast. Mix with coffee   Fish Oil 500 MG Caps Take 500 mg by mouth daily.   Flaxseed Oil 1000 MG  Caps Take 1,200 mg by mouth daily.   gabapentin 300 MG capsule Commonly known as:  NEURONTIN Take 300 mg by mouth at bedtime.   insulin NPH-regular Human (70-30) 100 UNIT/ML injection Commonly known as:  NOVOLIN 70/30 Inject 27-50 Units into the skin 2 (two) times daily. 50 units in the morning, & 27 units in the afternoon.   losartan 50 MG tablet Commonly known as:  COZAAR Take 1 tablet (50 mg total) by mouth daily with breakfast. What changed:    medication strength  how much to take   metoprolol tartrate 50 MG tablet Commonly known as:  LOPRESSOR Take 2 tablets (100 mg total) by mouth 2 (two) times daily. What changed:  medication strength   multivitamins with iron Tabs tablet Take 1 tablet by mouth daily.   omeprazole 40 MG capsule Commonly known as:  PRILOSEC Take 40 mg by mouth 2 (two) times daily.   OVER THE COUNTER MEDICATION Apply 1 application topically at bedtime. REVITALIFT   pravastatin 10 MG tablet Commonly known as:  PRAVACHOL Take 1 tablet (10 mg total) by mouth daily at 6 PM.   PROAIR HFA 108 (90 Base) MCG/ACT inhaler Generic drug:  albuterol Inhale 2 puffs into the lungs every 4 (four) hours as needed. For shortness of breath/wheezing.   RA BIOTIN 1000 MCG tablet Generic drug:  Biotin Take 1,000 mcg by mouth daily.   sertraline 100 MG tablet Commonly known as:  ZOLOFT Take 100 mg by mouth at bedtime.   SPIRIVA HANDIHALER 18 MCG inhalation capsule Generic drug:  tiotropium Place 18 mcg into inhaler and inhale daily.   sucralfate 1 g tablet Commonly known as:  CARAFATE DISSOLVE 1 TABLET IN 15ML OF WATER TO CREATE A SLURRY. TAKE BY MOUTH THREE TIMES DAILY BEFORE MEAL(S).   traMADol 50 MG tablet Commonly known as:  ULTRAM Take 50 mg by mouth every 6 (six) hours as needed (for pain.).   TURMERIC PO Take 1,000 mg by mouth daily.   vitamin C 1000 MG tablet Take 1,000 mg by mouth daily.        DISCHARGE INSTRUCTIONS:  If you  experience worsening of your admission symptoms, develop shortness of breath, life threatening emergency, suicidal or homicidal thoughts you must seek medical attention immediately by calling 911 or calling your MD immediately  if symptoms less severe.  You Must read complete instructions/literature along with all the possible adverse reactions/side effects for all the Medicines you take and that have been prescribed to you. Take any new Medicines after you have completely understood and accept all the possible adverse reactions/side effects.   Please note  You were cared for by a hospitalist during your hospital stay. If you have any questions about your discharge medications or the care you received while you were in the hospital after you are discharged, you can call the unit and asked to speak with the hospitalist on call if the hospitalist that took care of you is not available. Once you are discharged, your primary care physician will  handle any further medical issues. Please note that NO REFILLS for any discharge medications will be authorized once you are discharged, as it is imperative that you return to your primary care physician (or establish a relationship with a primary care physician if you do not have one) for your aftercare needs so that they can reassess your need for medications and monitor your lab values.    Today   CHIEF COMPLAINT:   Chief Complaint  Patient presents with  . Chest Pain    HISTORY OF PRESENT ILLNESS:  72 y.o. female with a known history of anemia of iron deficiency, CAD, hyperlipidemia, hypertension, diabetes and other comorbidities. Patient presented to emergency room for acute onset of chest tightness and shortness of breath with exertion going on for the past 24 to 48 hours, gradually getting worse.  Also noticed black stools going on for the past 2 days.  No abdominal pain.  No fever, chills, diaphoresis, nausea, vomiting, palpitations.  Patient was  recently transfused for symptomatic anemia approximately 2 weeks ago.  She has had issues with GI bleed in the past several times.  She underwent extensive GI work-up without finding a cause for her bleeding. Blood test done emergency room are remarkable for low hemoglobin level at 6.7.  Blood sugar is 251.  The reminder of the CBC and CMP are grossly unremarkable.  Troponin level is less than 0.03. EKG, reviewed by myself shows normal sinus rhythm with heart rate at 81 beats per minute, no acute ischemic changes. Chest x-ray is negative for acute cardiopulmonary abnormalities. Patient is admitted for further evaluation and treatment. VITAL SIGNS:  Blood pressure (!) 122/53, pulse (!) 57, temperature 98.3 F (36.8 C), temperature source Oral, resp. rate 20, height 5\' 3"  (1.6 m), weight 90.3 kg (199 lb 1.2 oz), SpO2 97 %.  I/O:    Intake/Output Summary (Last 24 hours) at 07/18/2018 1050 Last data filed at 07/18/2018 1047 Gross per 24 hour  Intake 2150 ml  Output 0 ml  Net 2150 ml    PHYSICAL EXAMINATION:  GENERAL:  72 y.o.-year-old patient lying in the bed with no acute distress.  EYES: Pupils equal, round, reactive to light and accommodation. No scleral icterus. Extraocular muscles intact.  HEENT: Head atraumatic, normocephalic. Oropharynx and nasopharynx clear.  NECK:  Supple, no jugular venous distention. No thyroid enlargement, no tenderness.  LUNGS: Normal breath sounds bilaterally, no wheezing, rales,rhonchi or crepitation. No use of accessory muscles of respiration.  CARDIOVASCULAR: S1, S2 normal. No murmurs, rubs, or gallops.  ABDOMEN: Soft, non-tender, non-distended. Bowel sounds present. No organomegaly or mass.  EXTREMITIES: No pedal edema, cyanosis, or clubbing.  NEUROLOGIC: Cranial nerves II through XII are intact. Muscle strength 5/5 in all extremities. Sensation intact. Gait not checked.  PSYCHIATRIC: The patient is alert and oriented x 3.  SKIN: No obvious rash, lesion, or  ulcer.   DATA REVIEW:   CBC Recent Labs  Lab 07/18/18 0601  WBC 5.5  HGB 8.8*  HCT 26.1*  PLT 261    Chemistries  Recent Labs  Lab 07/16/18 2200  07/18/18 0601  NA 139   < > 141  K 4.5   < > 4.2  CL 105   < > 110  CO2 26   < > 27  GLUCOSE 251*   < > 97  BUN 33*   < > 18  CREATININE 0.98   < > 0.98  CALCIUM 9.2   < > 8.4*  AST 22  --   --  ALT 14  --   --   ALKPHOS 65  --   --   BILITOT 0.4  --   --    < > = values in this interval not displayed.    Cardiac Enzymes Recent Labs  Lab 07/17/18 1149  TROPONINI <0.03    Microbiology Results  Results for orders placed or performed during the hospital encounter of 09/28/15  MRSA PCR Screening     Status: None   Collection Time: 10/01/15  6:28 PM  Result Value Ref Range Status   MRSA by PCR NEGATIVE NEGATIVE Final    Comment:        The GeneXpert MRSA Assay (FDA approved for NASAL specimens only), is one component of a comprehensive MRSA colonization surveillance program. It is not intended to diagnose MRSA infection nor to guide or monitor treatment for MRSA infections.     RADIOLOGY:  Dg Chest 2 View  Result Date: 07/16/2018 CLINICAL DATA:  Chest pain starting today in worse throughout the day. Shortness of breath on ambulation. History of anemia. EXAM: CHEST - 2 VIEW COMPARISON:  09/06/2016 FINDINGS: Borderline heart size with normal pulmonary vascularity. Mild linear fibrosis or atelectasis in the lung bases is similar to previous study. No focal consolidation. No blunting of costophrenic angles. No pneumothorax. Mediastinal contours appear intact. Degenerative changes in the spine. IMPRESSION: Borderline heart size. Mild linear fibrosis or atelectasis in the lung bases. No evidence of active pulmonary disease. Electronically Signed   By: Lucienne Capers M.D.   On: 07/16/2018 22:50   Ct Abdomen Pelvis W Contrast  Result Date: 07/17/2018 CLINICAL DATA:  72 year old female with GI bleed. History of  bleeding ulcer. Gastritis noted on 07/07/2018 endoscopy. Small colonic polyps noted and removed during colonoscopy 07/07/2018 (negative for malignancy). Post hysterectomy. Initial encounter. EXAM: CT ABDOMEN AND PELVIS WITH CONTRAST TECHNIQUE: Multidetector CT imaging of the abdomen and pelvis was performed using the standard protocol following bolus administration of intravenous contrast. CONTRAST:  143mL ISOVUE-300 IOPAMIDOL (ISOVUE-300) INJECTION 61% COMPARISON:  None. FINDINGS: Lower chest: Minimal basilar atelectasis/scarring. Top-normal heart size. Aortic mitral valve calcification. Coronary artery calcification. Hepatobiliary: Enlarged liver spanning over 18.3 cm. No worrisome hepatic lesion. Partially contracted gallbladder. No calcified gallstone or common bile duct stone. Pancreas: No worrisome pancreatic mass or inflammation. Spleen: No splenic mass or enlargement. Adrenals/Urinary Tract: No obstructing stone or hydronephrosis. No worrisome renal or adrenal lesion. Noncontrast filled views the urinary bladder without gross abnormality. Stomach/Bowel: Malposition bowel with the ligament of Treitz to the right of midline and cecum displaced medially and superiorly with the appendix to left of midline draping toward central aspect. Scattered diverticula without evidence of diverticulitis. Stomach under distended and evaluation limited. Vascular/Lymphatic: Atherosclerotic changes aorta and aortic branch vessels. No abdominal aortic aneurysm or large vessel occlusion. The superior mesenteric artery is located slightly posteriorly with respect to the superior mesenteric vein. Scattered small lymph nodes without adenopathy. Reproductive: Post hysterectomy.  Coarse calcifications left adnexa. Other: No free air or bowel containing hernia. Musculoskeletal: Degenerative changes thoracic and lumbar spine with mild scoliosis lumbar spine convex right. IMPRESSION: Malposition bowel with the ligament of Treitz to the  right of midline and cecum displaced medially and superiorly. Scattered diverticula without evidence of diverticulitis. Stomach under distended and evaluation limited. Aortic Atherosclerosis (ICD10-I70.0). Post hysterectomy. Coarse calcification left adnexa of indeterminate etiology or significance. Enlarged liver spanning over 18.3 cm possibly with fatty infiltration. Partially contracted gallbladder without calcified gallstone. Electronically Signed   By: Remo Lipps  Jeannine Kitten M.D.   On: 07/17/2018 08:14    EKG:   Orders placed or performed during the hospital encounter of 07/16/18  . EKG 12-Lead  . EKG 12-Lead  . ED EKG within 10 minutes  . ED EKG within 10 minutes      Management plans discussed with the patient, family and they are in agreement.  CODE STATUS:     Code Status Orders  (From admission, onward)        Start     Ordered   07/17/18 0245  Full code  Continuous     07/17/18 0248    Code Status History    Date Active Date Inactive Code Status Order ID Comments User Context   09/14/2017 0931 09/14/2017 1349 Full Code [id number]  Teodoro Spray, MD Inpatient   09/14/2017 0930 09/14/2017 0931 Full Code [id number]  Teodoro Spray, MD Inpatient   09/03/2016 0927 09/03/2016 1401 Full Code [id number]  Teodoro Spray, MD Inpatient   05/29/2016 1652 06/01/2016 1832 Full Code [id number]  Gladstone Lighter, MD Inpatient   10/01/2015 1512 10/02/2015 1803 Full Code [id number]  Algernon Huxley, MD Inpatient   09/28/2015 1553 10/01/2015 1512 Full Code [id number]  Loletha Grayer, MD ED   04/14/2013 0202 04/18/2013 1655 Full Code [id number]  Etta Quill., DO ED      TOTAL TIME TAKING CARE OF THIS PATIENT: 45 minutes.    Avel Peace Lamisha Roussell M.D on 07/18/2018 at 10:50 AM  Between 7am to 6pm - Pager - [phone]  After 6pm go to [email or web address] - password [email or web address] [facility]  Office  [phone]  CC: Primary care physician; Maryland Pink, MD   Note: This dictation was prepared  with Dragon dictation along with smaller phrase technology. Any transcriptional errors that result from this process are unintentional.

## 2018-07-19 DIAGNOSIS — D649 Anemia, unspecified: Secondary | ICD-10-CM | POA: Diagnosis not present

## 2018-07-19 DIAGNOSIS — K2971 Gastritis, unspecified, with bleeding: Secondary | ICD-10-CM | POA: Diagnosis not present

## 2018-07-20 ENCOUNTER — Inpatient Hospital Stay: Payer: Medicare HMO

## 2018-07-20 ENCOUNTER — Inpatient Hospital Stay: Payer: Medicare HMO | Attending: Oncology

## 2018-07-20 ENCOUNTER — Encounter: Payer: Self-pay | Admitting: Nurse Practitioner

## 2018-07-20 ENCOUNTER — Inpatient Hospital Stay: Payer: Medicare HMO | Admitting: Nurse Practitioner

## 2018-07-20 VITALS — BP 155/68 | HR 66 | Temp 97.3°F | Resp 18 | Wt 200.6 lb

## 2018-07-20 DIAGNOSIS — D500 Iron deficiency anemia secondary to blood loss (chronic): Secondary | ICD-10-CM | POA: Diagnosis not present

## 2018-07-20 DIAGNOSIS — R5383 Other fatigue: Secondary | ICD-10-CM

## 2018-07-20 DIAGNOSIS — K2970 Gastritis, unspecified, without bleeding: Secondary | ICD-10-CM | POA: Insufficient documentation

## 2018-07-20 DIAGNOSIS — Z87891 Personal history of nicotine dependence: Secondary | ICD-10-CM

## 2018-07-20 DIAGNOSIS — K921 Melena: Secondary | ICD-10-CM

## 2018-07-20 LAB — CBC WITH DIFFERENTIAL/PLATELET
Basophils Absolute: 0.1 10*3/uL (ref 0–0.1)
Basophils Relative: 1 %
Eosinophils Absolute: 0.3 10*3/uL (ref 0–0.7)
Eosinophils Relative: 7 %
HCT: 26.8 % — ABNORMAL LOW (ref 35.0–47.0)
Hemoglobin: 9 g/dL — ABNORMAL LOW (ref 12.0–16.0)
Lymphocytes Relative: 22 %
Lymphs Abs: 1 10*3/uL (ref 1.0–3.6)
MCH: 27.8 pg (ref 26.0–34.0)
MCHC: 33.5 g/dL (ref 32.0–36.0)
MCV: 83 fL (ref 80.0–100.0)
Monocytes Absolute: 0.5 10*3/uL (ref 0.2–0.9)
Monocytes Relative: 11 %
Neutro Abs: 2.8 10*3/uL (ref 1.4–6.5)
Neutrophils Relative %: 59 %
Platelets: 287 10*3/uL (ref 150–440)
RBC: 3.23 MIL/uL — ABNORMAL LOW (ref 3.80–5.20)
RDW: 16.1 % — ABNORMAL HIGH (ref 11.5–14.5)
WBC: 4.7 10*3/uL (ref 3.6–11.0)

## 2018-07-20 LAB — IRON AND TIBC
Iron: 17 ug/dL — ABNORMAL LOW (ref 28–170)
Saturation Ratios: 5 % — ABNORMAL LOW (ref 10.4–31.8)
TIBC: 322 ug/dL (ref 250–450)
UIBC: 305 ug/dL

## 2018-07-20 LAB — FERRITIN: Ferritin: 11 ng/mL (ref 11–307)

## 2018-07-20 MED ORDER — SODIUM CHLORIDE 0.9 % IV SOLN
INTRAVENOUS | Status: DC
  Administered 2018-07-20: 12:00:00 via INTRAVENOUS
  Filled 2018-07-20: qty 1000

## 2018-07-20 MED ORDER — SODIUM CHLORIDE 0.9 % IV SOLN
510.0000 mg | Freq: Once | INTRAVENOUS | Status: AC
  Administered 2018-07-20: 510 mg via INTRAVENOUS
  Filled 2018-07-20: qty 17

## 2018-07-20 NOTE — Progress Notes (Signed)
[place]  Telephone:([phone]) [phone] Fax:(336) [phone]  ID: Joyce Potter OB: [birth year]  MR#: [id number]  QIO#:[id number]  Patient Care Team: Maryland Pink, MD as PCP - General (Family Medicine) Manya Silvas, MD (Gastroenterology) Lloyd Huger, MD as Consulting Physician (Hematology and Oncology)  CHIEF COMPLAINT: Iron deficiency anemia, secondary to chronic blood loss.  INTERVAL HISTORY: Patient returns to clinic today for further evaluation, lab work, and consideration of IV iron.  In the interim, she presented to ED on 07/16/2018 with chest pain and shortness of breath.  She noted black tarry stools for past 2 days.  EGD on 07/17/2018 noted gastritis. She was treated with IV fluids, 2 units pRBC transfusion, Protonix drip.  CT NGO noted probable fatty liver/mild position of bile.  She is followed by GI.  Acute, severe symptomatic anemia resolved post transfusion and thought to be secondary to acute GI bleed.  She was discharged home on 07/18/2018.  Today, she reports persistent fatigue.  She last received Feraheme on 04/20/2018.  She continues to have black tarry stools approximately 3-4 times a day.  She continues PPI and sucralfate at home.  She is concerned about finding of fatty liver disease while inpatient and is questioning what an appropriate diet is.  She says that she is scheduled to see GI tomorrow.   REVIEW OF SYSTEMS:   Review of Systems  Constitutional: Negative for fever, malaise/fatigue and weight loss.  HENT: Negative.  Negative for nosebleeds.   Eyes: Negative.   Respiratory: Negative for cough, hemoptysis and shortness of breath.   Cardiovascular: Negative for chest pain and leg swelling.  Gastrointestinal: Positive for abdominal pain, diarrhea and melena. Negative for blood in stool, constipation, heartburn, nausea and vomiting.  Genitourinary: Negative for hematuria.  Musculoskeletal: Negative for falls.  Skin: Negative.   Negative for rash.  Neurological: Positive for weakness and headaches. Negative for dizziness and loss of consciousness.  Psychiatric/Behavioral: The patient is nervous/anxious.     As per HPI. Otherwise, a complete review of systems is negative.  PAST MEDICAL HISTORY: Past Medical History:  Diagnosis Date  . Anemia    iron deficiency  . Aortic stenosis   . Basal cell carcinoma   . CAD (coronary artery disease)    s/p Left circumflex stent in 2012  . Carotid stenosis   . Cataract   . High cholesterol   . HTN (hypertension)   . Hyperlipidemia   . IDDM (insulin dependent diabetes mellitus) ([place])   . Multiple thyroid nodules    Benign  . Peptic ulcer disease   . Retinal artery occlusion    on left    PAST SURGICAL HISTORY: Past Surgical History:  Procedure Laterality Date  . ARTERY BIOPSY Left 11/19/2015   Procedure: BIOPSY TEMPORAL ARTERY;  Surgeon: Algernon Huxley, MD;  Location: [facility] ORS;  Service: Vascular;  Laterality: Left;  . BREAST BIOPSY Left 2002   core- neg  . CARDIAC CATHETERIZATION N/A 08/08/2015   Procedure: Right and Left Heart Cath and Coronary Angiography;  Surgeon: Teodoro Spray, MD;  Location: [facility];  Service: Cardiovascular;  Laterality: N/A;  . CARDIAC CATHETERIZATION Bilateral 09/03/2016   Procedure: Right/Left Heart Cath and Coronary Angiography;  Surgeon: Teodoro Spray, MD;  Location: [facility];  Service: Cardiovascular;  Laterality: Bilateral;  . CATARACT EXTRACTION W/ INTRAOCULAR LENS  IMPLANT, BILATERAL    . COLONOSCOPY     in 2013- internal hemorrhoids  . COLONOSCOPY WITH PROPOFOL N/A  07/07/2018   Procedure: COLONOSCOPY WITH PROPOFOL;  Surgeon: Manya Silvas, MD;  Location: [facility];  Service: Endoscopy;  Laterality: N/A;  . CORONARY ANGIOGRAPHY N/A 09/14/2017   Procedure: CORONARY ANGIOGRAPHY;  Surgeon: Teodoro Spray, MD;  Location: [facility];  Service: Cardiovascular;  Laterality: N/A;  . CORONARY  ANGIOPLASTY    . CORONARY STENT PLACEMENT    . ENDARTERECTOMY Left 10/01/2015   Procedure: ENDARTERECTOMY CAROTID;  Surgeon: Algernon Huxley, MD;  Location: [facility] ORS;  Service: Vascular;  Laterality: Left;  . ESOPHAGOGASTRODUODENOSCOPY    . ESOPHAGOGASTRODUODENOSCOPY (EGD) WITH PROPOFOL N/A 05/31/2016   Procedure: ESOPHAGOGASTRODUODENOSCOPY (EGD) WITH PROPOFOL;  Surgeon: Manya Silvas, MD;  Location: [facility] ENDOSCOPY;  Service: Endoscopy;  Laterality: N/A;  . ESOPHAGOGASTRODUODENOSCOPY (EGD) WITH PROPOFOL N/A 07/07/2018   Procedure: ESOPHAGOGASTRODUODENOSCOPY (EGD) WITH PROPOFOL;  Surgeon: Manya Silvas, MD;  Location: [facility] ENDOSCOPY;  Service: Endoscopy;  Laterality: N/A;  . ESOPHAGOGASTRODUODENOSCOPY (EGD) WITH PROPOFOL N/A 07/17/2018   Procedure: ESOPHAGOGASTRODUODENOSCOPY (EGD) WITH PROPOFOL;  Surgeon: Lucilla Lame, MD;  Location: [facility] ENDOSCOPY;  Service: Endoscopy;  Laterality: N/A;  . EYE SURGERY    . GIVENS CAPSULE STUDY N/A 04/17/2013   Procedure: GIVENS CAPSULE STUDY;  Surgeon: Arta Silence, MD;  Location: [facility] ENDOSCOPY;  Service: Endoscopy;  Laterality: N/A;  patient ate breakfast at 7am   . PARTIAL HYSTERECTOMY    . RIGHT AND LEFT HEART CATH Bilateral 09/14/2017   Procedure: RIGHT AND LEFT HEART CATH;  Surgeon: Teodoro Spray, MD;  Location: [place] [facility];  Service: Cardiovascular;  Laterality: Bilateral;  . TRIGGER FINGER RELEASE      FAMILY HISTORY Family History  Problem Relation Age of Onset  . Uterine cancer Mother   . Breast cancer Mother 22  . Seizures Father   . Stroke Father   . Diabetes Father   . COPD Father   . Colon cancer Neg Hx     ADVANCED DIRECTIVES:   HEALTH MAINTENANCE: Social History   Tobacco Use  . Smoking status: Former Smoker    Last attempt to quit: 02/15/1985    Years since quitting: 33.4  . Smokeless tobacco: Never Used  . Tobacco comment: quit about 31 years ago  Substance Use Topics  . Alcohol use: No    Alcohol/week: 0.0 oz  .  Drug use: No    Colonoscopy:  Bone density:   Lipid panel:  Allergies  Allergen Reactions  . Aspirin Other (See Comments)    ulcers  . Invokamet [Canagliflozin-Metformin Hcl] Other (See Comments)    Yeast infection  . Sulfa Antibiotics Other (See Comments)    "Got Drunk"  . Lovastatin Rash  . Penicillins Rash    Has patient had a PCN reaction causing immediate rash, facial/tongue/throat swelling, SOB or lightheadedness with hypotension:Yes Has patient had a PCN reaction causing severe rash involving mucus membranes or skin necrosis:all over body Has patient had a PCN reaction that required hospitalization: No Has patient had a PCN reaction occurring within the last 10 years: No If all of the above answers are "NO", then may proceed with Cephalosporin use.   . Vicodin [Hydrocodone-Acetaminophen] Rash    Current Outpatient Medications  Medication Sig Dispense Refill  . insulin NPH-regular Human (NOVOLIN 70/30) (70-30) 100 UNIT/ML injection Inject 27-50 Units into the skin 2 (two) times daily. 50 units in the morning, & 27 units in the afternoon.    Marland Kitchen losartan (COZAAR) 50 MG tablet Take 1 tablet (50 mg total) by mouth  daily with breakfast. 60 tablet 0  . metoprolol tartrate (LOPRESSOR) 50 MG tablet Take 2 tablets (100 mg total) by mouth 2 (two) times daily. 60 tablet 0  . Multiple Vitamins-Iron (MULTIVITAMINS WITH IRON) TABS tablet Take 1 tablet by mouth daily.    . Omega-3 Fatty Acids (FISH OIL) 500 MG CAPS Take 500 mg by mouth daily.    Marland Kitchen omeprazole (PRILOSEC) 40 MG capsule Take 40 mg by mouth 2 (two) times daily.     . pravastatin (PRAVACHOL) 10 MG tablet Take 1 tablet (10 mg total) by mouth daily at 6 PM. 30 tablet 0  . sucralfate (CARAFATE) 1 g tablet DISSOLVE 1 TABLET IN 15ML OF WATER TO CREATE A SLURRY. TAKE BY MOUTH THREE TIMES DAILY BEFORE MEAL(S).  2  . albuterol (PROAIR HFA) 108 (90 Base) MCG/ACT inhaler Inhale 2 puffs into the lungs every 4 (four) hours as needed. For  shortness of breath/wheezing.    Marland Kitchen alendronate (FOSAMAX) 70 MG tablet Take 70 mg by mouth once a week. Take with a full glass of water on an empty stomach. (Tuesdays)    . amitriptyline (ELAVIL) 25 MG tablet Take 25 mg by mouth at bedtime.     . Ascorbic Acid (VITAMIN C) 1000 MG tablet Take 1,000 mg by mouth daily.    . Biotin (RA BIOTIN) 1000 MCG tablet Take 1,000 mcg by mouth daily.    . COLLAGEN PO Take 15 mLs by mouth daily with breakfast. Mix with coffee    . Emollient (CERAVE) CREA Apply 1 application topically daily.    . Flaxseed, Linseed, (FLAXSEED OIL) 1000 MG CAPS Take 1,200 mg by mouth daily.     Marland Kitchen gabapentin (NEURONTIN) 300 MG capsule Take 300 mg by mouth at bedtime.     Marland Kitchen OVER THE COUNTER MEDICATION Apply 1 application topically at bedtime. REVITALIFT    . sertraline (ZOLOFT) 100 MG tablet Take 100 mg by mouth at bedtime.     Marland Kitchen tiotropium (SPIRIVA HANDIHALER) 18 MCG inhalation capsule Place 18 mcg into inhaler and inhale daily.     . traMADol (ULTRAM) 50 MG tablet Take 50 mg by mouth every 6 (six) hours as needed (for pain.).    Marland Kitchen TURMERIC PO Take 1,000 mg by mouth daily.     No current facility-administered medications for this visit.     OBJECTIVE: Vitals:   07/20/18 1047  BP: (!) 155/68  Pulse: 66  Resp: 18  Temp: (!) 97.3 F (36.3 C)  SpO2: 98%     Body mass index is 35.53 kg/m.    ECOG FS:1 - Symptomatic but completely ambulatory  General: Well-developed, well-nourished, no acute distress. Eyes: Pink conjunctiva, anicteric sclera. Lungs: Clear to auscultation bilaterally. Heart: Regular rate and rhythm. No rubs, murmurs, or gallops. Abdomen: Soft, nontender, nondistended. Normoactive bowel sounds. Musculoskeletal: No edema, cyanosis, or clubbing. Neuro: Alert, answering all questions appropriately. Cranial nerves grossly intact. Skin: No rashes or petechiae noted. Psych: Normal affect.  LAB RESULTS:  Lab Results  Component Value Date   NA 141 07/18/2018     K 4.2 07/18/2018   CL 110 07/18/2018   CO2 27 07/18/2018   GLUCOSE 97 07/18/2018   BUN 18 07/18/2018   CREATININE 0.98 07/18/2018   CALCIUM 8.4 (L) 07/18/2018   PROT 7.0 07/16/2018   ALBUMIN 3.8 07/16/2018   AST 22 07/16/2018   ALT 14 07/16/2018   ALKPHOS 65 07/16/2018   BILITOT 0.4 07/16/2018   GFRNONAA 57 (L) 07/18/2018  GFRAA >60 07/18/2018    Lab Results  Component Value Date   WBC 4.7 07/20/2018   NEUTROABS 2.8 07/20/2018   HGB 9.0 (L) 07/20/2018   HCT 26.8 (L) 07/20/2018   MCV 83.0 07/20/2018   PLT 287 07/20/2018   Lab Results  Component Value Date   IRON 17 (L) 07/20/2018   TIBC 322 07/20/2018   IRONPCTSAT 5 (L) 07/20/2018    Lab Results  Component Value Date   FERRITIN 11 07/20/2018    STUDIES: Dg Chest 2 View  Result Date: 07/16/2018 CLINICAL DATA:  Chest pain starting today in worse throughout the day. Shortness of breath on ambulation. History of anemia. EXAM: CHEST - 2 VIEW COMPARISON:  09/06/2016 FINDINGS: Borderline heart size with normal pulmonary vascularity. Mild linear fibrosis or atelectasis in the lung bases is similar to previous study. No focal consolidation. No blunting of costophrenic angles. No pneumothorax. Mediastinal contours appear intact. Degenerative changes in the spine. IMPRESSION: Borderline heart size. Mild linear fibrosis or atelectasis in the lung bases. No evidence of active pulmonary disease. Electronically Signed   By: Lucienne Capers M.D.   On: 07/16/2018 22:50   Ct Abdomen Pelvis W Contrast  Result Date: 07/17/2018 CLINICAL DATA:  72 year old female with GI bleed. History of bleeding ulcer. Gastritis noted on 07/07/2018 endoscopy. Small colonic polyps noted and removed during colonoscopy 07/07/2018 (negative for malignancy). Post hysterectomy. Initial encounter. EXAM: CT ABDOMEN AND PELVIS WITH CONTRAST TECHNIQUE: Multidetector CT imaging of the abdomen and pelvis was performed using the standard protocol following bolus  administration of intravenous contrast. CONTRAST:  140mL ISOVUE-300 IOPAMIDOL (ISOVUE-300) INJECTION 61% COMPARISON:  None. FINDINGS: Lower chest: Minimal basilar atelectasis/scarring. Top-normal heart size. Aortic mitral valve calcification. Coronary artery calcification. Hepatobiliary: Enlarged liver spanning over 18.3 cm. No worrisome hepatic lesion. Partially contracted gallbladder. No calcified gallstone or common bile duct stone. Pancreas: No worrisome pancreatic mass or inflammation. Spleen: No splenic mass or enlargement. Adrenals/Urinary Tract: No obstructing stone or hydronephrosis. No worrisome renal or adrenal lesion. Noncontrast filled views the urinary bladder without gross abnormality. Stomach/Bowel: Malposition bowel with the ligament of Treitz to the right of midline and cecum displaced medially and superiorly with the appendix to left of midline draping toward central aspect. Scattered diverticula without evidence of diverticulitis. Stomach under distended and evaluation limited. Vascular/Lymphatic: Atherosclerotic changes aorta and aortic branch vessels. No abdominal aortic aneurysm or large vessel occlusion. The superior mesenteric artery is located slightly posteriorly with respect to the superior mesenteric vein. Scattered small lymph nodes without adenopathy. Reproductive: Post hysterectomy.  Coarse calcifications left adnexa. Other: No free air or bowel containing hernia. Musculoskeletal: Degenerative changes thoracic and lumbar spine with mild scoliosis lumbar spine convex right. IMPRESSION: Malposition bowel with the ligament of Treitz to the right of midline and cecum displaced medially and superiorly. Scattered diverticula without evidence of diverticulitis. Stomach under distended and evaluation limited. Aortic Atherosclerosis (ICD10-I70.0). Post hysterectomy. Coarse calcification left adnexa of indeterminate etiology or significance. Enlarged liver spanning over 18.3 cm possibly with  fatty infiltration. Partially contracted gallbladder without calcified gallstone. Electronically Signed   By: Genia Del M.D.   On: 07/17/2018 08:14    ASSESSMENT: Iron deficiency anemia, secondary to chronic blood loss d/t gastritis  PLAN:    1. Iron deficiency anemia, secondary to chronic blood loss: Hmg 9.0 today, improved after 2 units pRBCs during recent hospitalization for melena (see below). Ferritin today 11. Saturation Ratio- 5, TIBC 322. Proceed with IV Feraheme today. No transfusion today.  2. Melena- persistent.  Prior EGD showed gastritis and continuing to have melena; 3-4 stools per day. Currently on PPI and sucralfate. No intervention during recent EGD. Not on anticoagulants or antiplatelets but does take fish oil. Is scheduled to follow up with GI tomorrow.   3. Gastritis- likely source of blood loss. Continue recommendations per GI and follow up tomorrow.   Proceed with Feraheme today. RTC on 07/25/18 for labs and re-evaluation with Dr. Grayland Ormond. Anticipate that she may also need transfusion that day so will set her up for 'hold tube' and possible transfusion/blood.   Discussed case and findings with Dr. Grayland Ormond who contributed to and recommended management plan of care.   Patient expressed understanding and was in agreement with this plan. She also understands that She can call clinic at any time with any questions, concerns, or complaints.   [name] Rutter, DNP, AGNP-C [place] at [facility] [phone] (work cell) [phone] (office) 07/20/18 5:28 PM

## 2018-07-21 DIAGNOSIS — D500 Iron deficiency anemia secondary to blood loss (chronic): Secondary | ICD-10-CM | POA: Diagnosis not present

## 2018-07-21 DIAGNOSIS — K921 Melena: Secondary | ICD-10-CM | POA: Diagnosis not present

## 2018-07-21 DIAGNOSIS — Z8719 Personal history of other diseases of the digestive system: Secondary | ICD-10-CM | POA: Diagnosis not present

## 2018-07-21 DIAGNOSIS — R195 Other fecal abnormalities: Secondary | ICD-10-CM | POA: Diagnosis not present

## 2018-07-25 ENCOUNTER — Inpatient Hospital Stay: Payer: Medicare HMO

## 2018-07-25 ENCOUNTER — Other Ambulatory Visit: Payer: Self-pay

## 2018-07-25 ENCOUNTER — Inpatient Hospital Stay (HOSPITAL_BASED_OUTPATIENT_CLINIC_OR_DEPARTMENT_OTHER): Payer: Medicare HMO | Admitting: Nurse Practitioner

## 2018-07-25 VITALS — BP 167/77 | HR 70 | Temp 95.8°F | Resp 18 | Wt 199.7 lb

## 2018-07-25 VITALS — BP 152/74 | HR 64 | Temp 96.5°F | Resp 18

## 2018-07-25 DIAGNOSIS — K2970 Gastritis, unspecified, without bleeding: Secondary | ICD-10-CM | POA: Diagnosis not present

## 2018-07-25 DIAGNOSIS — K922 Gastrointestinal hemorrhage, unspecified: Secondary | ICD-10-CM

## 2018-07-25 DIAGNOSIS — I350 Nonrheumatic aortic (valve) stenosis: Secondary | ICD-10-CM | POA: Diagnosis not present

## 2018-07-25 DIAGNOSIS — D500 Iron deficiency anemia secondary to blood loss (chronic): Secondary | ICD-10-CM

## 2018-07-25 DIAGNOSIS — Z87891 Personal history of nicotine dependence: Secondary | ICD-10-CM | POA: Diagnosis not present

## 2018-07-25 DIAGNOSIS — K921 Melena: Secondary | ICD-10-CM | POA: Diagnosis not present

## 2018-07-25 DIAGNOSIS — D62 Acute posthemorrhagic anemia: Secondary | ICD-10-CM

## 2018-07-25 DIAGNOSIS — R5383 Other fatigue: Secondary | ICD-10-CM | POA: Diagnosis not present

## 2018-07-25 LAB — PREPARE RBC (CROSSMATCH)

## 2018-07-25 LAB — CBC WITH DIFFERENTIAL/PLATELET
BASOS PCT: 1 %
Basophils Absolute: 0.1 10*3/uL (ref 0–0.1)
EOS ABS: 0.3 10*3/uL (ref 0–0.7)
Eosinophils Relative: 7 %
HEMATOCRIT: 24.3 % — AB (ref 35.0–47.0)
HEMOGLOBIN: 8.1 g/dL — AB (ref 12.0–16.0)
LYMPHS ABS: 1 10*3/uL (ref 1.0–3.6)
Lymphocytes Relative: 23 %
MCH: 28.4 pg (ref 26.0–34.0)
MCHC: 33.2 g/dL (ref 32.0–36.0)
MCV: 85.4 fL (ref 80.0–100.0)
MONOS PCT: 9 %
Monocytes Absolute: 0.4 10*3/uL (ref 0.2–0.9)
NEUTROS ABS: 2.5 10*3/uL (ref 1.4–6.5)
NEUTROS PCT: 60 %
Platelets: 253 10*3/uL (ref 150–440)
RBC: 2.85 MIL/uL — AB (ref 3.80–5.20)
RDW: 16.6 % — ABNORMAL HIGH (ref 11.5–14.5)
WBC: 4.2 10*3/uL (ref 3.6–11.0)

## 2018-07-25 LAB — SAMPLE TO BLOOD BANK

## 2018-07-25 MED ORDER — SODIUM CHLORIDE 0.9 % IV SOLN
Freq: Once | INTRAVENOUS | Status: AC
  Administered 2018-07-25: 10:00:00 via INTRAVENOUS
  Filled 2018-07-25: qty 1000

## 2018-07-25 MED ORDER — SODIUM CHLORIDE 0.9 % IV SOLN
510.0000 mg | Freq: Once | INTRAVENOUS | Status: AC
  Administered 2018-07-25: 510 mg via INTRAVENOUS
  Filled 2018-07-25: qty 17

## 2018-07-25 NOTE — Progress Notes (Signed)
[facility] [place]  Telephone:([phone] [phone]  Patient Care Team: [place] Pink, MD as PCP - General (Family Medicine) Manya Silvas, MD (Gastroenterology) Lloyd Huger, MD as Consulting Physician (Hematology and Oncology)   Name of the patient: Joyce Potter  [id number]  [birth year]   Date of visit: 07/25/18  Diagnosis- IDA  Chief complaint/ Reason for visit- Melena   Heme History:  -08/24/2010: Screening colonoscopy-palpable for mucosa at appendiceal orifice, sigmoid/distal descending diverticulosis.  Path-lymphoid aggregate 12/02/2012: EGD while inpatient for GI bleed-slight irritation in gastric antrum, body, and cardia 12/04/2012: Colonoscopy while inpatient for melena-small internal hemorrhoids. 04/16/2013: CT abdomen pelvis with contrast for heme positive stool, anemia, nausea, constipation-diverticulosis, congenital malrotation of bowel. 05/09/2013: EGD while inpatient for heme positive stool-LA grade a reflux esophagitis, diffuse gastritis and cardia, body, and prepyloric region 05/09/2013: Capsule endoscopy for anemia and heme positive stool.  Resulted as normal  Interval history- Joyce Potter reports melena.  Symptoms have been present off and on for several years but are recently worse. She presented to ER on 07/16/2018 for chest pain and dyspnea on exertion.  She was found to be anemic with hemoglobin of 6.7.  Melanotic stool was heme positive.  She was admitted to hospital for symptomatic anemia and received transfusions of prbcs, IV fluids, Protonix drip, CT angiogram which noted probable fatty liver/malposition of bile, consultation from GI and underwent EGD on 07/17/2018 which noted gastritis.  She was discharged home on 07/18/2018.     She was last seen in clinic on 07/20/2018 for melena and received 1 dose of IV Feraheme.  She was seen by GI on 07/21/2018.   Today, she reports that melena continues and  she has 3-4 black 'mushy' stools per day.  She reports palpitations, worse when laying down and at night, shortness of breath worse with exertion, and weakness.  She denies abdominal bloating, belching, reflux, chest pain, difficulty swallowing, or early satiety.  She avoids NSAIDs and does not take anticoagulants.  She has stopped fish oil and other supplements.  She has a history of aortic stenosis and is very concerned about her heart and her anemia.  She does not feel that melena is worsened or increased in volume or frequency but is concerned that it has persisted.   Most recent clinical course summarized below:  -- 04/20/18: Feraheme x 1 -- 06/05/18: Hemoccult + (Primary Care- Dr. Kary Kos) -- 06/22/18: GI evaluation w/ Tammi Klippel, PA. 'dark brown' stools. EGD/CSY scheduled. Continue Omeprazole & lifestyle modifications discussed.  -- 06/23/18: Received 2 units PRBCs. -- 06/26/18: Hgb 9.1, Hct 28.7, MCHC 31.7. -- 07/04/18: Hgb 9.1, Hct 28.8, MCHC 31.6. -- 07/07/18: EGD - small hiatal hernia, gastritis. -- 07/07/18: CSY - diminutive hyperplastic polyp in descending colon, diminutive TA in transverse colon, otherwise negative. Recommend capsule endoscopy. -- 7/21: [facility] ED evaluation > admission for symptomatic anemia with CP, SOB, and melena. Hgb 6.7, Hct 20.6. Underwent CT and EGD (below). Received 2 units PRBCs, IV pantoprazole. Hgb 8.8 at discharge. -- 7/22-7/23/19: CT A/P w/ contrast - malposition of bowel with ligament of Treitz to right of midline and cecum displaced medially and superiorly, hepatomegaly with possible fatty infiltration, partially contracted gallbladder with calcified gallstone. -- 07/17/18: EGD - gastritis. -- 07/20/18: Hematology f/up. Reported black/tarry stools 3-4x/day. Labs: Hgb 9.0, Hct 26.8, iron 17, ferritin 11. Received IV Feraheme x 1 -- 07/21/18: GI f/up. Capsule endoscopy scheduled. Omeprazole increased to 40 mg BID and Sucralfate 1g  TID AC. Lifestyle modifications  reviewed.    ECOG FS:1 - Symptomatic but completely ambulatory  Review of systems- Review of Systems  Constitutional: Negative for chills, fever, malaise/fatigue and weight loss.  HENT: Negative for congestion, ear discharge, ear pain, sinus pain, sore throat and tinnitus.   Eyes: Negative.   Respiratory: Negative.  Negative for cough, sputum production and shortness of breath.   Cardiovascular: Positive for palpitations. Negative for chest pain, orthopnea, claudication and leg swelling.  Gastrointestinal: Positive for abdominal pain, blood in stool and melena. Negative for constipation, diarrhea, heartburn, nausea and vomiting.  Genitourinary: Negative.  Negative for hematuria.  Musculoskeletal: Negative.   Skin: Negative.   Neurological: Positive for weakness. Negative for dizziness, tingling and headaches.  Endo/Heme/Allergies: Negative.   Psychiatric/Behavioral: The patient is nervous/anxious.      Allergies  Allergen Reactions  . Aspirin Other (See Comments)    ulcers  . Invokamet [Canagliflozin-Metformin Hcl] Other (See Comments)    Yeast infection  . Sulfa Antibiotics Other (See Comments)    "Got Drunk"  . Lovastatin Rash  . Penicillins Rash    Has patient had a PCN reaction causing immediate rash, facial/tongue/throat swelling, SOB or lightheadedness with hypotension:Yes Has patient had a PCN reaction causing severe rash involving mucus membranes or skin necrosis:all over body Has patient had a PCN reaction that required hospitalization: No Has patient had a PCN reaction occurring within the last 10 years: No If all of the above answers are "NO", then may proceed with Cephalosporin use.   . Vicodin [Hydrocodone-Acetaminophen] Rash    Past Medical History:  Diagnosis Date  . Anemia    iron deficiency  . Aortic stenosis   . Basal cell carcinoma   . CAD (coronary artery disease)    s/p Left circumflex stent in 2012  . Carotid stenosis   . Cataract   . High  cholesterol   . HTN (hypertension)   . Hyperlipidemia   . IDDM (insulin dependent diabetes mellitus) ([place])   . Multiple thyroid nodules    Benign  . Peptic ulcer disease   . Retinal artery occlusion    on left    Past Surgical History:  Procedure Laterality Date  . ARTERY BIOPSY Left 11/19/2015   Procedure: BIOPSY TEMPORAL ARTERY;  Surgeon: Algernon Huxley, MD;  Location: [facility] ORS;  Service: Vascular;  Laterality: Left;  . BREAST BIOPSY Left 2002   core- neg  . CARDIAC CATHETERIZATION N/A 08/08/2015   Procedure: Right and Left Heart Cath and Coronary Angiography;  Surgeon: Teodoro Spray, MD;  Location: [place] [facility];  Service: Cardiovascular;  Laterality: N/A;  . CARDIAC CATHETERIZATION Bilateral 09/03/2016   Procedure: Right/Left Heart Cath and Coronary Angiography;  Surgeon: Teodoro Spray, MD;  Location: [place] [facility];  Service: Cardiovascular;  Laterality: Bilateral;  . CATARACT EXTRACTION W/ INTRAOCULAR LENS  IMPLANT, BILATERAL    . COLONOSCOPY     in 2013- internal hemorrhoids  . COLONOSCOPY WITH PROPOFOL N/A 07/07/2018   Procedure: COLONOSCOPY WITH PROPOFOL;  Surgeon: Manya Silvas, MD;  Location: [facility] ENDOSCOPY;  Service: Endoscopy;  Laterality: N/A;  . CORONARY ANGIOGRAPHY N/A 09/14/2017   Procedure: CORONARY ANGIOGRAPHY;  Surgeon: Teodoro Spray, MD;  Location: [place] [facility] LAB;  Service: Cardiovascular;  Laterality: N/A;  . CORONARY ANGIOPLASTY    . CORONARY STENT PLACEMENT    . ENDARTERECTOMY Left 10/01/2015   Procedure: ENDARTERECTOMY CAROTID;  Surgeon: Algernon Huxley, MD;  Location: [facility] ORS;  Service:  Vascular;  Laterality: Left;  . ESOPHAGOGASTRODUODENOSCOPY    . ESOPHAGOGASTRODUODENOSCOPY (EGD) WITH PROPOFOL N/A 05/31/2016   Procedure: ESOPHAGOGASTRODUODENOSCOPY (EGD) WITH PROPOFOL;  Surgeon: Manya Silvas, MD;  Location: [facility] ENDOSCOPY;  Service: Endoscopy;  Laterality: N/A;  . ESOPHAGOGASTRODUODENOSCOPY (EGD) WITH PROPOFOL N/A 07/07/2018   Procedure:  ESOPHAGOGASTRODUODENOSCOPY (EGD) WITH PROPOFOL;  Surgeon: Manya Silvas, MD;  Location[facility]COPY;  Service: Endoscopy;  Laterality: N/A;  . ESOPHAGOGASTRODUODENOSCOPY (EGD) WITH PROPOFOL N/A 07/17/2018   Procedure: ESOPHAGOGASTRODUODENOSCOPY (EGD) WITH PROPOFOL;  Surgeon: Lucilla Lame, MD;  Location: [facility] ENDOSCOPY;  Service: Endoscopy;  Laterality: N/A;  . EYE SURGERY    . GIVENS CAPSULE STUDY N/A 04/17/2013   Procedure: GIVENS CAPSULE STUDY;  Surgeon: Arta Silence, MD;  Location: [facility] ENDOSCOPY;  Service: Endoscopy;  Laterality: N/A;  patient ate breakfast at 7am   . PARTIAL HYSTERECTOMY    . RIGHT AND LEFT HEART CATH Bilateral 09/14/2017   Procedure: RIGHT AND LEFT HEART CATH;  Surgeon: Teodoro Spray, MD;  Location: [place] CV LAB;  Service: Cardiovascular;  Laterality: Bilateral;  . TRIGGER FINGER RELEASE      Social History   Socioeconomic History  . Marital status: Married    Spouse name: Not on file  . Number of children: Not on file  . Years of education: Not on file  . Highest education level: Not on file  Occupational History  . Not on file  Social Needs  . Financial resource strain: Not on file  . Food insecurity:    Worry: Not on file    Inability: Not on file  . Transportation needs:    Medical: Not on file    Non-medical: Not on file  Tobacco Use  . Smoking status: Former Smoker    Last attempt to quit: 02/15/1985    Years since quitting: 33.4  . Smokeless tobacco: Never Used  . Tobacco comment: quit about 31 years ago  Substance and Sexual Activity  . Alcohol use: No    Alcohol/week: 0.0 oz  . Drug use: No  . Sexual activity: Not on file  Lifestyle  . Physical activity:    Days per week: Not on file    Minutes per session: Not on file  . Stress: Not on file  Relationships  . Social connections:    Talks on phone: Not on file    Gets together: Not on file    Attends religious service: Not on file    Active member of club or organization:  Not on file    Attends meetings of clubs or organizations: Not on file    Relationship status: Not on file  . Intimate partner violence:    Fear of current or ex partner: Not on file    Emotionally abused: Not on file    Physically abused: Not on file    Forced sexual activity: Not on file  Other Topics Concern  . Not on file  Social History Narrative   Lives at home independently.    Family History  Problem Relation Age of Onset  . Uterine cancer Mother   . Breast cancer Mother 22  . Seizures Father   . Stroke Father   . Diabetes Father   . COPD Father   . Colon cancer Neg Hx      Current Outpatient Medications:  .  alendronate (FOSAMAX) 70 MG tablet, Take 70 mg by mouth once a week. Take with a full glass of water on an empty stomach. (Tuesdays),  Disp: , Rfl:  .  insulin NPH-regular Human (NOVOLIN 70/30) (70-30) 100 UNIT/ML injection, Inject 27-50 Units into the skin 2 (two) times daily. 50 units in the morning, & 27 units in the afternoon., Disp: , Rfl:  .  losartan (COZAAR) 50 MG tablet, Take 1 tablet (50 mg total) by mouth daily with breakfast., Disp: 60 tablet, Rfl: 0 .  metoprolol tartrate (LOPRESSOR) 50 MG tablet, Take 2 tablets (100 mg total) by mouth 2 (two) times daily., Disp: 60 tablet, Rfl: 0 .  omeprazole (PRILOSEC) 40 MG capsule, Take 40 mg by mouth 2 (two) times daily. , Disp: , Rfl:  .  pravastatin (PRAVACHOL) 10 MG tablet, Take 1 tablet (10 mg total) by mouth daily at 6 PM., Disp: 30 tablet, Rfl: 0 .  sucralfate (CARAFATE) 1 g tablet, DISSOLVE 1 TABLET IN 15ML OF WATER TO CREATE A SLURRY. TAKE BY MOUTH THREE TIMES DAILY BEFORE MEAL(S)., Disp: , Rfl: 2 .  tiotropium (SPIRIVA HANDIHALER) 18 MCG inhalation capsule, Place 18 mcg into inhaler and inhale daily. , Disp: , Rfl:  .  albuterol (PROAIR HFA) 108 (90 Base) MCG/ACT inhaler, Inhale 2 puffs into the lungs every 4 (four) hours as needed. For shortness of breath/wheezing., Disp: , Rfl:  .  amitriptyline (ELAVIL)  25 MG tablet, Take 25 mg by mouth at bedtime. , Disp: , Rfl:  .  Ascorbic Acid (VITAMIN C) 1000 MG tablet, Take 1,000 mg by mouth daily., Disp: , Rfl:  .  Biotin (RA BIOTIN) 1000 MCG tablet, Take 1,000 mcg by mouth daily., Disp: , Rfl:  .  COLLAGEN PO, Take 15 mLs by mouth daily with breakfast. Mix with coffee, Disp: , Rfl:  .  Emollient (CERAVE) CREA, Apply 1 application topically daily., Disp: , Rfl:  .  Flaxseed, Linseed, (FLAXSEED OIL) 1000 MG CAPS, Take 1,200 mg by mouth daily. , Disp: , Rfl:  .  fluticasone (FLONASE) 50 MCG/ACT nasal spray, Place into the nose., Disp: , Rfl:  .  gabapentin (NEURONTIN) 300 MG capsule, Take 300 mg by mouth at bedtime. , Disp: , Rfl:  .  Multiple Vitamins-Iron (MULTIVITAMINS WITH IRON) TABS tablet, Take 1 tablet by mouth daily., Disp: , Rfl:  .  Omega-3 Fatty Acids (FISH OIL) 500 MG CAPS, Take 500 mg by mouth daily., Disp: , Rfl:  .  OVER THE COUNTER MEDICATION, Apply 1 application topically at bedtime. REVITALIFT, Disp: , Rfl:  .  sertraline (ZOLOFT) 100 MG tablet, Take 100 mg by mouth at bedtime. , Disp: , Rfl:  .  traMADol (ULTRAM) 50 MG tablet, Take 50 mg by mouth every 6 (six) hours as needed (for pain.)., Disp: , Rfl:  .  TURMERIC PO, Take 1,000 mg by mouth daily., Disp: , Rfl:   Physical exam:  Vitals:   07/25/18 0835  BP: (!) 167/77  Pulse: 70  Resp: 18  Temp: (!) 95.8 F (35.4 C)  TempSrc: Tympanic  Weight: 199 lb 11.2 oz (90.6 kg)   Physical Exam  Constitutional: She is oriented to person, place, and time. She appears well-developed and well-nourished.  HENT:  Head: Atraumatic.  Nose: Nose normal.  Mouth/Throat: Oropharynx is clear and moist. No oropharyngeal exudate.  Eyes: Conjunctivae are normal. No scleral icterus.  Neck: Normal range of motion.  Cardiovascular: Normal rate and regular rhythm.  Murmur heard. Pulmonary/Chest: Effort normal and breath sounds normal.  Abdominal: Soft. Bowel sounds are normal.  Obese; per scan,  enlarged liver  Musculoskeletal: She exhibits no edema.  Neurological: She is alert and oriented to person, place, and time.  Skin: Skin is warm and dry.  Psychiatric: She has a normal mood and affect.     CMP Latest Ref Rng & Units 07/18/2018  Glucose 70 - 99 mg/dL 97  BUN 8 - 23 mg/dL 18  Creatinine 0.44 - 1.00 mg/dL 0.98  Sodium 135 - 145 mmol/L 141  Potassium 3.5 - 5.1 mmol/L 4.2  Chloride 98 - 111 mmol/L 110  CO2 22 - 32 mmol/L 27  Calcium 8.9 - 10.3 mg/dL 8.4(L)  Total Protein 6.5 - 8.1 g/dL -  Total Bilirubin 0.3 - 1.2 mg/dL -  Alkaline Phos 38 - 126 U/L -  AST 15 - 41 U/L -  ALT 0 - 44 U/L -   CBC Latest Ref Rng & Units 07/25/2018  WBC 3.6 - 11.0 K/uL 4.2  Hemoglobin 12.0 - 16.0 g/dL 8.1(L)  Hematocrit 35.0 - 47.0 % 24.3(L)  Platelets 150 - 440 K/uL 253    No images are attached to the encounter.  Dg Chest 2 View  Result Date: 07/16/2018 CLINICAL DATA:  Chest pain starting today in worse throughout the day. Shortness of breath on ambulation. History of anemia. EXAM: CHEST - 2 VIEW COMPARISON:  09/06/2016 FINDINGS: Borderline heart size with normal pulmonary vascularity. Mild linear fibrosis or atelectasis in the lung bases is similar to previous study. No focal consolidation. No blunting of costophrenic angles. No pneumothorax. Mediastinal contours appear intact. Degenerative changes in the spine. IMPRESSION: Borderline heart size. Mild linear fibrosis or atelectasis in the lung bases. No evidence of active pulmonary disease. Electronically Signed   By: Lucienne Capers M.D.   On: 07/16/2018 22:50   Ct Abdomen Pelvis W Contrast  Result Date: 07/17/2018 CLINICAL DATA:  72 year old female with GI bleed. History of bleeding ulcer. Gastritis noted on 07/07/2018 endoscopy. Small colonic polyps noted and removed during colonoscopy 07/07/2018 (negative for malignancy). Post hysterectomy. Initial encounter. EXAM: CT ABDOMEN AND PELVIS WITH CONTRAST TECHNIQUE: Multidetector CT  imaging of the abdomen and pelvis was performed using the standard protocol following bolus administration of intravenous contrast. CONTRAST:  122mL ISOVUE-300 IOPAMIDOL (ISOVUE-300) INJECTION 61% COMPARISON:  None. FINDINGS: Lower chest: Minimal basilar atelectasis/scarring. Top-normal heart size. Aortic mitral valve calcification. Coronary artery calcification. Hepatobiliary: Enlarged liver spanning over 18.3 cm. No worrisome hepatic lesion. Partially contracted gallbladder. No calcified gallstone or common bile duct stone. Pancreas: No worrisome pancreatic mass or inflammation. Spleen: No splenic mass or enlargement. Adrenals/Urinary Tract: No obstructing stone or hydronephrosis. No worrisome renal or adrenal lesion. Noncontrast filled views the urinary bladder without gross abnormality. Stomach/Bowel: Malposition bowel with the ligament of Treitz to the right of midline and cecum displaced medially and superiorly with the appendix to left of midline draping toward central aspect. Scattered diverticula without evidence of diverticulitis. Stomach under distended and evaluation limited. Vascular/Lymphatic: Atherosclerotic changes aorta and aortic branch vessels. No abdominal aortic aneurysm or large vessel occlusion. The superior mesenteric artery is located slightly posteriorly with respect to the superior mesenteric vein. Scattered small lymph nodes without adenopathy. Reproductive: Post hysterectomy.  Coarse calcifications left adnexa. Other: No free air or bowel containing hernia. Musculoskeletal: Degenerative changes thoracic and lumbar spine with mild scoliosis lumbar spine convex right. IMPRESSION: Malposition bowel with the ligament of Treitz to the right of midline and cecum displaced medially and superiorly. Scattered diverticula without evidence of diverticulitis. Stomach under distended and evaluation limited. Aortic Atherosclerosis (ICD10-I70.0). Post hysterectomy. Coarse calcification left adnexa of  indeterminate etiology  or significance. Enlarged liver spanning over 18.3 cm possibly with fatty infiltration. Partially contracted gallbladder without calcified gallstone. Electronically Signed   By: Genia Del M.D.   On: 07/17/2018 08:14    Assessment and plan- Patient is a 72 y.o. female with history of IDA who returns to symptom management clinic today for melena and anemia.   1.  Iron deficiency anemia-due to chronic GI blood loss. Received 1 dose of Feraheme on 07/20/18. Will give second dose of Feraheme today and plan to check b12 and folate level at next lab draw.   2. Acute blood loss anemia- d/t GI bleed. Hmg today 8.1. Symptomatic. Will plan on transfusion on 8/2 of 1 unit pRBCs with goal of maintaining hemoglobin greater than 7 and active bleeding.   2.  Melena-melena and heme positive stools since beginning of June.  Recent EGD and colonoscopy unremarkable.  Continues to have 3-4 black stools per day.  Capsule endoscopy planned for 8/20.  I reached out to Tammi Klippel, [place] at [facility] who says this is first available capsule and she has patient on cancellation list.  She will also reach out to [facility] to see if they can accommodate patient sooner.    Proceed with IV Feraheme x 1 today. RTC on 07/28/18 for cbc & transfuse 1 unit pRBCs.  Continue to follow-up with GI.   Patient expressed understanding and was in agreement with this plan. She also understands that She can call clinic at any time with any questions, concerns, or complaints.   Thank you for allowing me to participate in the care of this very pleasant patient.   [name] Rutter, DNP, AGNP-C [place] at [facility] [phone] (work cell) [phone] (office)

## 2018-07-25 NOTE — Progress Notes (Signed)
Here for symptom mgt - per pt feeling weak,no energy " I stay tired " having dark black stools daily -4  BMs yesterday and once today . Stools are mushy she stated.

## 2018-07-27 LAB — PREPARE RBC (CROSSMATCH)

## 2018-07-28 ENCOUNTER — Inpatient Hospital Stay: Payer: Medicare HMO

## 2018-07-28 ENCOUNTER — Inpatient Hospital Stay: Payer: Medicare HMO | Attending: Nurse Practitioner

## 2018-07-28 ENCOUNTER — Other Ambulatory Visit: Payer: Self-pay | Admitting: *Deleted

## 2018-07-28 DIAGNOSIS — R319 Hematuria, unspecified: Secondary | ICD-10-CM | POA: Diagnosis not present

## 2018-07-28 DIAGNOSIS — D62 Acute posthemorrhagic anemia: Secondary | ICD-10-CM

## 2018-07-28 DIAGNOSIS — K921 Melena: Secondary | ICD-10-CM | POA: Diagnosis not present

## 2018-07-28 DIAGNOSIS — I10 Essential (primary) hypertension: Secondary | ICD-10-CM | POA: Insufficient documentation

## 2018-07-28 DIAGNOSIS — I350 Nonrheumatic aortic (valve) stenosis: Secondary | ICD-10-CM | POA: Diagnosis not present

## 2018-07-28 DIAGNOSIS — D500 Iron deficiency anemia secondary to blood loss (chronic): Secondary | ICD-10-CM | POA: Diagnosis not present

## 2018-07-28 DIAGNOSIS — Z87891 Personal history of nicotine dependence: Secondary | ICD-10-CM | POA: Diagnosis not present

## 2018-07-28 LAB — BPAM RBC
Blood Product Expiration Date: 201908242359
UNIT TYPE AND RH: 5100

## 2018-07-28 LAB — CBC WITH DIFFERENTIAL/PLATELET
BASOS PCT: 1 %
Basophils Absolute: 0.1 10*3/uL (ref 0–0.1)
Eosinophils Absolute: 0.2 10*3/uL (ref 0–0.7)
Eosinophils Relative: 6 %
HEMATOCRIT: 24.7 % — AB (ref 35.0–47.0)
HEMOGLOBIN: 8 g/dL — AB (ref 12.0–16.0)
LYMPHS ABS: 0.8 10*3/uL — AB (ref 1.0–3.6)
Lymphocytes Relative: 19 %
MCH: 28.6 pg (ref 26.0–34.0)
MCHC: 32.5 g/dL (ref 32.0–36.0)
MCV: 87.8 fL (ref 80.0–100.0)
MONOS PCT: 9 %
Monocytes Absolute: 0.4 10*3/uL (ref 0.2–0.9)
NEUTROS ABS: 2.8 10*3/uL (ref 1.4–6.5)
NEUTROS PCT: 65 %
Platelets: 268 10*3/uL (ref 150–440)
RBC: 2.81 MIL/uL — ABNORMAL LOW (ref 3.80–5.20)
RDW: 18.8 % — ABNORMAL HIGH (ref 11.5–14.5)
WBC: 4.3 10*3/uL (ref 3.6–11.0)

## 2018-07-28 LAB — TYPE AND SCREEN
ABO/RH(D): O POS
ANTIBODY SCREEN: NEGATIVE
UNIT DIVISION: 0

## 2018-07-28 LAB — PREPARE RBC (CROSSMATCH)

## 2018-07-28 LAB — SAMPLE TO BLOOD BANK

## 2018-07-28 MED ORDER — HEPARIN SOD (PORK) LOCK FLUSH 100 UNIT/ML IV SOLN: 500.0000 [IU] | Freq: Once | INTRAVENOUS | Status: AC

## 2018-07-28 MED ORDER — SODIUM CHLORIDE 0.9% FLUSH
10.0000 mL | Freq: Once | INTRAVENOUS | Status: AC
  Filled 2018-07-28: qty 10

## 2018-07-31 ENCOUNTER — Inpatient Hospital Stay: Payer: Medicare HMO

## 2018-07-31 ENCOUNTER — Inpatient Hospital Stay (HOSPITAL_BASED_OUTPATIENT_CLINIC_OR_DEPARTMENT_OTHER): Payer: Medicare HMO | Admitting: Nurse Practitioner

## 2018-07-31 ENCOUNTER — Other Ambulatory Visit: Payer: Self-pay

## 2018-07-31 ENCOUNTER — Telehealth: Payer: Self-pay | Admitting: Nurse Practitioner

## 2018-07-31 ENCOUNTER — Encounter: Payer: Self-pay | Admitting: Nurse Practitioner

## 2018-07-31 VITALS — BP 164/68 | HR 78 | Temp 96.8°F | Resp 18 | Wt 198.6 lb

## 2018-07-31 DIAGNOSIS — K921 Melena: Secondary | ICD-10-CM

## 2018-07-31 DIAGNOSIS — I10 Essential (primary) hypertension: Secondary | ICD-10-CM

## 2018-07-31 DIAGNOSIS — D62 Acute posthemorrhagic anemia: Secondary | ICD-10-CM

## 2018-07-31 DIAGNOSIS — I350 Nonrheumatic aortic (valve) stenosis: Secondary | ICD-10-CM

## 2018-07-31 DIAGNOSIS — Z87891 Personal history of nicotine dependence: Secondary | ICD-10-CM | POA: Diagnosis not present

## 2018-07-31 DIAGNOSIS — D500 Iron deficiency anemia secondary to blood loss (chronic): Secondary | ICD-10-CM

## 2018-07-31 DIAGNOSIS — R319 Hematuria, unspecified: Secondary | ICD-10-CM | POA: Diagnosis not present

## 2018-07-31 LAB — URINALYSIS, COMPLETE (UACMP) WITH MICROSCOPIC
BACTERIA UA: NONE SEEN
Bilirubin Urine: NEGATIVE
Glucose, UA: 50 mg/dL — AB
Hgb urine dipstick: NEGATIVE
Ketones, ur: NEGATIVE mg/dL
Nitrite: NEGATIVE
Protein, ur: NEGATIVE mg/dL
SPECIFIC GRAVITY, URINE: 1.01 (ref 1.005–1.030)
Squamous Epithelial / LPF: NONE SEEN (ref 0–5)
pH: 5 (ref 5.0–8.0)

## 2018-07-31 LAB — CBC WITH DIFFERENTIAL/PLATELET
BASOS ABS: 0.1 10*3/uL (ref 0–0.1)
BASOS PCT: 1 %
EOS PCT: 8 %
Eosinophils Absolute: 0.3 10*3/uL (ref 0–0.7)
HCT: 24.6 % — ABNORMAL LOW (ref 35.0–47.0)
HEMOGLOBIN: 8.2 g/dL — AB (ref 12.0–16.0)
LYMPHS ABS: 1.1 10*3/uL (ref 1.0–3.6)
Lymphocytes Relative: 26 %
MCH: 30.2 pg (ref 26.0–34.0)
MCHC: 33.3 g/dL (ref 32.0–36.0)
MCV: 90.7 fL (ref 80.0–100.0)
Monocytes Absolute: 0.4 10*3/uL (ref 0.2–0.9)
Monocytes Relative: 9 %
NEUTROS PCT: 56 %
Neutro Abs: 2.4 10*3/uL (ref 1.4–6.5)
PLATELETS: 244 10*3/uL (ref 150–440)
RBC: 2.71 MIL/uL — AB (ref 3.80–5.20)
RDW: 22.6 % — ABNORMAL HIGH (ref 11.5–14.5)
WBC: 4.4 10*3/uL (ref 3.6–11.0)

## 2018-07-31 MED ORDER — SODIUM CHLORIDE 0.9% IV SOLUTION
250.0000 mL | Freq: Once | INTRAVENOUS | Status: AC
  Administered 2018-07-31: 250 mL via INTRAVENOUS
  Filled 2018-07-31: qty 250

## 2018-07-31 MED ORDER — FUROSEMIDE 10 MG/ML IJ SOLN
20.0000 mg | Freq: Once | INTRAMUSCULAR | Status: AC
  Administered 2018-07-31: 20 mg via INTRAVENOUS
  Filled 2018-07-31: qty 2

## 2018-07-31 MED ORDER — ACETAMINOPHEN 325 MG PO TABS
650.0000 mg | ORAL_TABLET | Freq: Once | ORAL | Status: AC
  Administered 2018-07-31: 650 mg via ORAL
  Filled 2018-07-31: qty 2

## 2018-07-31 MED ORDER — DIPHENHYDRAMINE HCL 25 MG PO CAPS
25.0000 mg | ORAL_CAPSULE | Freq: Once | ORAL | Status: AC
  Administered 2018-07-31: 25 mg via ORAL
  Filled 2018-07-31: qty 1

## 2018-07-31 NOTE — Progress Notes (Signed)
Symptom Management [place]  Telephone:([phone] [phone]  Patient Care Team: [place] Pink, MD as PCP - General (Family Medicine) Manya Silvas, MD (Gastroenterology) Lloyd Huger, MD as Consulting Physician (Hematology and Oncology)   Name of the patient: Joyce Potter  [id number]  August 14, 1946   Date of visit: 07/31/18  Diagnosis-anemia  Chief complaint/ Reason for visit-melena/symptomatic anemia  Interval history- Joyce Potter, is 72 year old female, with PMH significant for IDA d/t chronic blood loss, Aortic stenosis, who presents to symptom management clinic for symptomatic anemia and melena.   Patient states that she continues to have black mushy stools for past several weeks.  She has stools 3-4 times a day.  She continues to have palpitations and weakness.  She feels tired.  She also reports one episode of "pink" urine. she has been evaluated by GI in continues to need transfusion support.  Last received 2 units of pRBCs during recent hospitalization for melena & anemia.  She received 2 doses of IV iron at [facility] upon discharge.  She was scheduled to have 2 units transfused on 07/28/2018 but due to removal of blood bank armband transfusion was postponed.  She is followed by Tammi Klippel, GI, who is scheduled capsule study for 08/03/2018 for further evaluation of melena.  Today, patient denies neurologic complaints.  She denies recent fevers or illness.  She denies easy bruising.  Reports appetite is fair but denies weight loss.  She denies chest pain, vomiting, constipation.  Denies further specific complaints today.  ECOG FS:1 - Symptomatic but completely ambulatory  Review of systems- Review of Systems  Constitutional: Positive for malaise/fatigue. Negative for chills, fever and weight loss.  HENT: Negative for congestion, ear discharge, ear pain, sinus pain, sore throat and tinnitus.   Eyes: Negative.   Respiratory:  Positive for shortness of breath. Negative for cough, hemoptysis and sputum production.   Cardiovascular: Positive for palpitations. Negative for chest pain, orthopnea, claudication and leg swelling.  Gastrointestinal: Positive for melena. Negative for abdominal pain, constipation, diarrhea, heartburn, nausea and vomiting.  Genitourinary: Positive for hematuria (1 episode). Negative for dysuria, flank pain, frequency and urgency.  Musculoskeletal: Negative.   Skin: Negative.   Neurological: Positive for weakness. Negative for dizziness, tingling, loss of consciousness and headaches.  Endo/Heme/Allergies: Negative.   Psychiatric/Behavioral: Negative.      Current treatment- Feraheme & Transfusions  Allergies  Allergen Reactions  . Aspirin Other (See Comments)    ulcers  . Invokamet [Canagliflozin-Metformin Hcl] Other (See Comments)    Yeast infection  . Sulfa Antibiotics Other (See Comments)    "Got Drunk"  . Lovastatin Rash  . Penicillins Rash    Has patient had a PCN reaction causing immediate rash, facial/tongue/throat swelling, SOB or lightheadedness with hypotension:Yes Has patient had a PCN reaction causing severe rash involving mucus membranes or skin necrosis:all over body Has patient had a PCN reaction that required hospitalization: No Has patient had a PCN reaction occurring within the last 10 years: No If all of the above answers are "NO", then may proceed with Cephalosporin use.   . Vicodin [Hydrocodone-Acetaminophen] Rash    Past Medical History:  Diagnosis Date  . Anemia    iron deficiency  . Aortic stenosis   . Basal cell carcinoma   . CAD (coronary artery disease)    s/p Left circumflex stent in 2012  . Carotid stenosis   . Cataract   . High cholesterol   . HTN (hypertension)   .  Hyperlipidemia   . IDDM (insulin dependent diabetes mellitus) ([place])   . Multiple thyroid nodules    Benign  . Peptic ulcer disease   . Retinal artery occlusion    on left     Past Surgical History:  Procedure Laterality Date  . ARTERY BIOPSY Left 11/19/2015   Procedure: BIOPSY TEMPORAL ARTERY;  Surgeon: Algernon Huxley, MD;  Location: [facility] ORS;  Service: Vascular;  Laterality: Left;  . BREAST BIOPSY Left 2002   core- neg  . CARDIAC CATHETERIZATION N/A 08/08/2015   Procedure: Right and Left Heart Cath and Coronary Angiography;  Surgeon: Teodoro Spray, MD;  Location: [place] CV LAB;  Service: Cardiovascular;  Laterality: N/A;  . CARDIAC CATHETERIZATION Bilateral 09/03/2016   Procedure: Right/Left Heart Cath and Coronary Angiography;  Surgeon: Teodoro Spray, MD;  Location: [place] CV LAB;  Service: Cardiovascular;  Laterality: Bilateral;  . CATARACT EXTRACTION W/ INTRAOCULAR LENS  IMPLANT, BILATERAL    . COLONOSCOPY     in 2013- internal hemorrhoids  . COLONOSCOPY WITH PROPOFOL N/A 07/07/2018   Procedure: COLONOSCOPY WITH PROPOFOL;  Surgeon: Manya Silvas, MD;  Location: [facility] ENDOSCOPY;  Service: Endoscopy;  Laterality: N/A;  . CORONARY ANGIOGRAPHY N/A 09/14/2017   Procedure: CORONARY ANGIOGRAPHY;  Surgeon: Teodoro Spray, MD;  Location: [place] CV LAB;  Service: Cardiovascular;  Laterality: N/A;  . CORONARY ANGIOPLASTY    . CORONARY STENT PLACEMENT    . ENDARTERECTOMY Left 10/01/2015   Procedure: ENDARTERECTOMY CAROTID;  Surgeon: Algernon Huxley, MD;  Location: [facility] ORS;  Service: Vascular;  Laterality: Left;  . ESOPHAGOGASTRODUODENOSCOPY    . ESOPHAGOGASTRODUODENOSCOPY (EGD) WITH PROPOFOL N/A 05/31/2016   Procedure: ESOPHAGOGASTRODUODENOSCOPY (EGD) WITH PROPOFOL;  Surgeon: Manya Silvas, MD;  Location: [facility] ENDOSCOPY;  Service: Endoscopy;  Laterality: N/A;  . ESOPHAGOGASTRODUODENOSCOPY (EGD) WITH PROPOFOL N/A 07/07/2018   Procedure: ESOPHAGOGASTRODUODENOSCOPY (EGD) WITH PROPOFOL;  Surgeon: Manya Silvas, MD;  Location: [facility] ENDOSCOPY;  Service: Endoscopy;  Laterality: N/A;  . ESOPHAGOGASTRODUODENOSCOPY (EGD) WITH PROPOFOL N/A 07/17/2018    Procedure: ESOPHAGOGASTRODUODENOSCOPY (EGD) WITH PROPOFOL;  Surgeon: Lucilla Lame, MD;  Location: [facility] ENDOSCOPY;  Service: Endoscopy;  Laterality: N/A;  . EYE SURGERY    . GIVENS CAPSULE STUDY N/A 04/17/2013   Procedure: GIVENS CAPSULE STUDY;  Surgeon: Arta Silence, MD;  Location: [facility] ENDOSCOPY;  Service: Endoscopy;  Laterality: N/A;  patient ate breakfast at 7am   . PARTIAL HYSTERECTOMY    . RIGHT AND LEFT HEART CATH Bilateral 09/14/2017   Procedure: RIGHT AND LEFT HEART CATH;  Surgeon: Teodoro Spray, MD;  Location: [place] CV LAB;  Service: Cardiovascular;  Laterality: Bilateral;  . TRIGGER FINGER RELEASE      Social History   Socioeconomic History  . Marital status: Married    Spouse name: Not on file  . Number of children: Not on file  . Years of education: Not on file  . Highest education level: Not on file  Occupational History  . Not on file  Social Needs  . Financial resource strain: Not on file  . Food insecurity:    Worry: Not on file    Inability: Not on file  . Transportation needs:    Medical: Not on file    Non-medical: Not on file  Tobacco Use  . Smoking status: Former Smoker    Last attempt to quit: 02/15/1985    Years since quitting: 33.4  . Smokeless tobacco: Never Used  . Tobacco comment: quit about 31 years ago  Substance and Sexual Activity  . Alcohol use: No    Alcohol/week: 0.0 oz  . Drug use: No  . Sexual activity: Not on file  Lifestyle  . Physical activity:    Days per week: Not on file    Minutes per session: Not on file  . Stress: Not on file  Relationships  . Social connections:    Talks on phone: Not on file    Gets together: Not on file    Attends religious service: Not on file    Active member of club or organization: Not on file    Attends meetings of clubs or organizations: Not on file    Relationship status: Not on file  . Intimate partner violence:    Fear of current or ex partner: Not on file    Emotionally abused: Not  on file    Physically abused: Not on file    Forced sexual activity: Not on file  Other Topics Concern  . Not on file  Social History Narrative   Lives at home independently.    Family History  Problem Relation Age of Onset  . Uterine cancer Mother   . Breast cancer Mother 28  . Seizures Father   . Stroke Father   . Diabetes Father   . COPD Father   . Colon cancer Neg Hx      Current Outpatient Medications:  .  albuterol (PROAIR HFA) 108 (90 Base) MCG/ACT inhaler, Inhale 2 puffs into the lungs every 4 (four) hours as needed. For shortness of breath/wheezing., Disp: , Rfl:  .  alendronate (FOSAMAX) 70 MG tablet, Take 70 mg by mouth once a week. Take with a full glass of water on an empty stomach. (Tuesdays), Disp: , Rfl:  .  Emollient (CERAVE) CREA, Apply 1 application topically daily., Disp: , Rfl:  .  fluticasone (FLONASE) 50 MCG/ACT nasal spray, Place into the nose., Disp: , Rfl:  .  insulin NPH-regular Human (NOVOLIN 70/30) (70-30) 100 UNIT/ML injection, Inject 27-50 Units into the skin 2 (two) times daily. 50 units in the morning, & 27 units in the afternoon., Disp: , Rfl:  .  losartan (COZAAR) 50 MG tablet, Take 1 tablet (50 mg total) by mouth daily with breakfast., Disp: 60 tablet, Rfl: 0 .  metoprolol tartrate (LOPRESSOR) 50 MG tablet, Take 2 tablets (100 mg total) by mouth 2 (two) times daily., Disp: 60 tablet, Rfl: 0 .  omeprazole (PRILOSEC) 40 MG capsule, Take 40 mg by mouth 2 (two) times daily. , Disp: , Rfl:  .  pravastatin (PRAVACHOL) 10 MG tablet, Take 1 tablet (10 mg total) by mouth daily at 6 PM., Disp: 30 tablet, Rfl: 0 .  sucralfate (CARAFATE) 1 g tablet, DISSOLVE 1 TABLET IN 15ML OF WATER TO CREATE A SLURRY. TAKE BY MOUTH THREE TIMES DAILY BEFORE MEAL(S)., Disp: , Rfl: 2 .  tiotropium (SPIRIVA HANDIHALER) 18 MCG inhalation capsule, Place 18 mcg into inhaler and inhale daily. , Disp: , Rfl:  .  amitriptyline (ELAVIL) 25 MG tablet, Take 25 mg by mouth at bedtime. ,  Disp: , Rfl:  .  Ascorbic Acid (VITAMIN C) 1000 MG tablet, Take 1,000 mg by mouth daily., Disp: , Rfl:  .  Biotin (RA BIOTIN) 1000 MCG tablet, Take 1,000 mcg by mouth daily., Disp: , Rfl:  .  COLLAGEN PO, Take 15 mLs by mouth daily with breakfast. Mix with coffee, Disp: , Rfl:  .  Flaxseed, Linseed, (FLAXSEED OIL) 1000 MG CAPS, Take 1,200  mg by mouth daily. , Disp: , Rfl:  .  gabapentin (NEURONTIN) 300 MG capsule, Take 300 mg by mouth at bedtime. , Disp: , Rfl:  .  Multiple Vitamins-Iron (MULTIVITAMINS WITH IRON) TABS tablet, Take 1 tablet by mouth daily., Disp: , Rfl:  .  Omega-3 Fatty Acids (FISH OIL) 500 MG CAPS, Take 500 mg by mouth daily., Disp: , Rfl:  .  OVER THE COUNTER MEDICATION, Apply 1 application topically at bedtime. REVITALIFT, Disp: , Rfl:  .  traMADol (ULTRAM) 50 MG tablet, Take 50 mg by mouth every 6 (six) hours as needed (for pain.)., Disp: , Rfl:  .  TURMERIC PO, Take 1,000 mg by mouth daily., Disp: , Rfl:  No current facility-administered medications for this visit.   Facility-Administered Medications Ordered in Other Visits:  .  furosemide (LASIX) injection 20 mg, 20 mg, Intravenous, Once, Verlon Au, NP .  heparin lock flush 100 unit/mL, 500 Units, Intravenous, Once, Verlon Au, NP .  sodium chloride flush (NS) 0.9 % injection 10 mL, 10 mL, Intravenous, Once, Verlon Au, NP  Physical exam:  Vitals:   07/31/18 0910 07/31/18 0935  BP: (!) 160/69 (!) 164/68  Pulse: 76 78  Resp: 18 18  Temp: (!) 96.8 F (36 C)   TempSrc: Tympanic   SpO2: 98%   Weight: 198 lb 9.6 oz (90.1 kg)    Physical Exam  Constitutional: She is oriented to person, place, and time. She appears well-developed and well-nourished.  HENT:  Head: Atraumatic.  Nose: Nose normal.  Mouth/Throat: Oropharynx is clear and moist. No oropharyngeal exudate.  Eyes: Conjunctivae are normal. No scleral icterus.  Neck: Normal range of motion.  Cardiovascular: Normal rate and regular rhythm.    Murmur heard. Pulmonary/Chest: Effort normal and breath sounds normal.  Abdominal: Soft. Bowel sounds are normal. There is tenderness (Epigastric TTP). There is no rebound and no guarding.  rounded  Musculoskeletal: She exhibits no edema.  Neurological: She is alert and oriented to person, place, and time.  Skin: Skin is warm and dry. There is pallor.  Psychiatric: She has a normal mood and affect.     CMP Latest Ref Rng & Units 07/18/2018  Glucose 70 - 99 mg/dL 97  BUN 8 - 23 mg/dL 18  Creatinine 0.44 - 1.00 mg/dL 0.98  Sodium 135 - 145 mmol/L 141  Potassium 3.5 - 5.1 mmol/L 4.2  Chloride 98 - 111 mmol/L 110  CO2 22 - 32 mmol/L 27  Calcium 8.9 - 10.3 mg/dL 8.4(L)  Total Protein 6.5 - 8.1 g/dL -  Total Bilirubin 0.3 - 1.2 mg/dL -  Alkaline Phos 38 - 126 U/L -  AST 15 - 41 U/L -  ALT 0 - 44 U/L -   CBC Latest Ref Rng & Units 07/31/2018  WBC 3.6 - 11.0 K/uL 4.4  Hemoglobin 12.0 - 16.0 g/dL 8.2(L)  Hematocrit 35.0 - 47.0 % 24.6(L)  Platelets 150 - 440 K/uL 244    No images are attached to the encounter.  Dg Chest 2 View  Result Date: 07/16/2018 CLINICAL DATA:  Chest pain starting today in worse throughout the day. Shortness of breath on ambulation. History of anemia. EXAM: CHEST - 2 VIEW COMPARISON:  09/06/2016 FINDINGS: Borderline heart size with normal pulmonary vascularity. Mild linear fibrosis or atelectasis in the lung bases is similar to previous study. No focal consolidation. No blunting of costophrenic angles. No pneumothorax. Mediastinal contours appear intact. Degenerative changes in the spine. IMPRESSION: Borderline heart size.  Mild linear fibrosis or atelectasis in the lung bases. No evidence of active pulmonary disease. Electronically Signed   By: Lucienne Capers M.D.   On: 07/16/2018 22:50   Ct Abdomen Pelvis W Contrast  Result Date: 07/17/2018 CLINICAL DATA:  72 year old female with GI bleed. History of bleeding ulcer. Gastritis noted on 07/07/2018 endoscopy. Small  colonic polyps noted and removed during colonoscopy 07/07/2018 (negative for malignancy). Post hysterectomy. Initial encounter. EXAM: CT ABDOMEN AND PELVIS WITH CONTRAST TECHNIQUE: Multidetector CT imaging of the abdomen and pelvis was performed using the standard protocol following bolus administration of intravenous contrast. CONTRAST:  155mL ISOVUE-300 IOPAMIDOL (ISOVUE-300) INJECTION 61% COMPARISON:  None. FINDINGS: Lower chest: Minimal basilar atelectasis/scarring. Top-normal heart size. Aortic mitral valve calcification. Coronary artery calcification. Hepatobiliary: Enlarged liver spanning over 18.3 cm. No worrisome hepatic lesion. Partially contracted gallbladder. No calcified gallstone or common bile duct stone. Pancreas: No worrisome pancreatic mass or inflammation. Spleen: No splenic mass or enlargement. Adrenals/Urinary Tract: No obstructing stone or hydronephrosis. No worrisome renal or adrenal lesion. Noncontrast filled views the urinary bladder without gross abnormality. Stomach/Bowel: Malposition bowel with the ligament of Treitz to the right of midline and cecum displaced medially and superiorly with the appendix to left of midline draping toward central aspect. Scattered diverticula without evidence of diverticulitis. Stomach under distended and evaluation limited. Vascular/Lymphatic: Atherosclerotic changes aorta and aortic branch vessels. No abdominal aortic aneurysm or large vessel occlusion. The superior mesenteric artery is located slightly posteriorly with respect to the superior mesenteric vein. Scattered small lymph nodes without adenopathy. Reproductive: Post hysterectomy.  Coarse calcifications left adnexa. Other: No free air or bowel containing hernia. Musculoskeletal: Degenerative changes thoracic and lumbar spine with mild scoliosis lumbar spine convex right. IMPRESSION: Malposition bowel with the ligament of Treitz to the right of midline and cecum displaced medially and superiorly.  Scattered diverticula without evidence of diverticulitis. Stomach under distended and evaluation limited. Aortic Atherosclerosis (ICD10-I70.0). Post hysterectomy. Coarse calcification left adnexa of indeterminate etiology or significance. Enlarged liver spanning over 18.3 cm possibly with fatty infiltration. Partially contracted gallbladder without calcified gallstone. Electronically Signed   By: Genia Del M.D.   On: 07/17/2018 08:14    Assessment and plan- Patient is a 72 y.o. female iron deficiency anemia secondary to chronic blood loss who presents to symptom management clinic for symptomatic anemia.  1.  Anemia-iron deficiency lung is secondary to chronic blood loss; s/p 2 units pRBCs during recent hospitalization & 2 doses of IV Feraheme. Melena persists (see below) and she continues to be symptomatic. Hmg 8.2 today. Given continued blood loss and upcoming capsule study, will proceed with transfusion of 1 unit of PRBCstransfusion of 1 unit of pRBCs to maintain goal hemoglobin > 8.   2.  Melena- pt continues to have melena 3-4 times per day. Possible gastritis. Capsule study scheduled for 08/03/2018 to further evaluate. Requests hemoglobin > 8 for study.   3. Aortic Stenosis- hx of aortic stenosis and murmur auscultated. Hypertensive in clinic but reports goal BPs at home. With concern of fluid overload, will give blood over 2 hours and follow with lasix. Patient encouraged to follow up with cardiology and PCP for continued management of aortic stenosis and hypertension.   4. Hematuria- one episode of 'pink' urine at home per patient. Will check UA to further evaluate.   Proceed with transfusion of 1 unit of pRBCs today followed by lasix. Follow-up with GI on 08/03/18 for capsule study. Follow up as scheduled for labs and further evaluation with  Dr. Grayland Ormond on 08/08/18.    Visit Diagnosis 1. Iron deficiency anemia due to chronic blood loss   2. Melena     Patient expressed understanding and  was in agreement with this plan. She also understands that She can call clinic at any time with any questions, concerns, or complaints.   Thank you for allowing me to participate in the care of this very pleasant patient.   [name] Rutter, DNP, AGNP-C [facility] (work cell) [phone] (office) 07/31/18 10:31 AM   Cc: Tammi Klippel, PA (GI), Dr. Grayland Ormond (hematology)

## 2018-07-31 NOTE — Telephone Encounter (Signed)
Called patient to discuss results of UA. No answer. Left message for patient to return call.   UA negative for blood. If she notices abnormally colored urine in the future she can follow up with PCP for further evaluation.

## 2018-08-01 ENCOUNTER — Inpatient Hospital Stay: Payer: Medicare HMO | Admitting: Nurse Practitioner

## 2018-08-01 ENCOUNTER — Inpatient Hospital Stay: Payer: Medicare HMO

## 2018-08-01 ENCOUNTER — Other Ambulatory Visit: Payer: Self-pay | Admitting: *Deleted

## 2018-08-01 DIAGNOSIS — D649 Anemia, unspecified: Secondary | ICD-10-CM

## 2018-08-01 LAB — BPAM RBC
BLOOD PRODUCT EXPIRATION DATE: 201908052359
BLOOD PRODUCT EXPIRATION DATE: 201908182359
ISSUE DATE / TIME: 201908051025
UNIT TYPE AND RH: 5100
Unit Type and Rh: 5100

## 2018-08-01 LAB — TYPE AND SCREEN
ABO/RH(D): O POS
Antibody Screen: NEGATIVE
UNIT DIVISION: 0
Unit division: 0

## 2018-08-02 ENCOUNTER — Inpatient Hospital Stay: Payer: Medicare HMO

## 2018-08-02 DIAGNOSIS — D500 Iron deficiency anemia secondary to blood loss (chronic): Secondary | ICD-10-CM | POA: Diagnosis not present

## 2018-08-02 DIAGNOSIS — D649 Anemia, unspecified: Secondary | ICD-10-CM

## 2018-08-02 LAB — CBC WITH DIFFERENTIAL/PLATELET
BASOS ABS: 0.1 10*3/uL (ref 0–0.1)
BASOS PCT: 1 %
EOS ABS: 0.3 10*3/uL (ref 0–0.7)
Eosinophils Relative: 7 %
HCT: 29.9 % — ABNORMAL LOW (ref 35.0–47.0)
HEMOGLOBIN: 9.8 g/dL — AB (ref 12.0–16.0)
Lymphocytes Relative: 17 %
Lymphs Abs: 0.8 10*3/uL — ABNORMAL LOW (ref 1.0–3.6)
MCH: 30.1 pg (ref 26.0–34.0)
MCHC: 32.9 g/dL (ref 32.0–36.0)
MCV: 91.6 fL (ref 80.0–100.0)
Monocytes Absolute: 0.4 10*3/uL (ref 0.2–0.9)
Monocytes Relative: 9 %
NEUTROS ABS: 3.1 10*3/uL (ref 1.4–6.5)
NEUTROS PCT: 66 %
Platelets: 274 10*3/uL (ref 150–440)
RBC: 3.26 MIL/uL — AB (ref 3.80–5.20)
RDW: 21.9 % — ABNORMAL HIGH (ref 11.5–14.5)
WBC: 4.7 10*3/uL (ref 3.6–11.0)

## 2018-08-02 LAB — IRON AND TIBC
Iron: 51 ug/dL (ref 28–170)
SATURATION RATIOS: 18 % (ref 10.4–31.8)
TIBC: 284 ug/dL (ref 250–450)
UIBC: 233 ug/dL

## 2018-08-02 LAB — SAMPLE TO BLOOD BANK

## 2018-08-02 LAB — FERRITIN: Ferritin: 299 ng/mL (ref 11–307)

## 2018-08-03 ENCOUNTER — Encounter: Payer: Self-pay | Admitting: Nurse Practitioner

## 2018-08-03 DIAGNOSIS — D500 Iron deficiency anemia secondary to blood loss (chronic): Secondary | ICD-10-CM | POA: Diagnosis not present

## 2018-08-05 ENCOUNTER — Encounter: Payer: Self-pay | Admitting: Nurse Practitioner

## 2018-08-06 NOTE — Progress Notes (Signed)
[place]  [phone] [phone]  ID: Joyce Potter OB: [birth year]  MR#: [id number]  KPT#:[id number]  Patient Care Team: [place] Pink, MD as PCP - General (Family Medicine) Manya Silvas, MD (Gastroenterology) Lloyd Huger, MD as Consulting Physician (Hematology and Oncology)  CHIEF COMPLAINT: Iron deficiency anemia, secondary to chronic blood loss.  INTERVAL HISTORY: Patient returns to clinic today for repeat laboratory work and further evaluation.  She continues to have ongoing melena, but states it has improved.  She feels significantly improved after recent blood transfusion, but continues to have weakness and fatigue.  She otherwise feels well.  She has no other neurologic complaints. She denies any recent fevers.  She denies any chest pain, shortness of breath, cough, or hemoptysis.  She denies any nausea, vomiting, constipation, or diarrhea. She has no urinary complaints.  Patient offers no further specific complaints today.  REVIEW OF SYSTEMS:   Review of Systems  Constitutional: Negative for fever, malaise/fatigue and weight loss.  Eyes: Negative.  Negative for blurred vision.  Respiratory: Negative for cough, hemoptysis and shortness of breath.   Cardiovascular: Negative.  Negative for chest pain and leg swelling.  Gastrointestinal: Positive for melena. Negative for abdominal pain and blood in stool.  Genitourinary: Negative.  Negative for hematuria.  Musculoskeletal: Negative.  Negative for joint pain.  Skin: Negative.  Negative for rash.  Neurological: Negative.  Negative for dizziness, focal weakness, weakness and headaches.  Psychiatric/Behavioral: Negative.  The patient is not nervous/anxious.     As per HPI. Otherwise, a complete review of systems is negative.  PAST MEDICAL HISTORY: Past Medical History:  Diagnosis Date  . Anemia    iron deficiency  . Aortic stenosis   . Basal cell carcinoma   . CAD  (coronary artery disease)    s/p Left circumflex stent in 2012  . Carotid stenosis   . Cataract   . High cholesterol   . HTN (hypertension)   . Hyperlipidemia   . IDDM (insulin dependent diabetes mellitus) ([place])   . Multiple thyroid nodules    Benign  . Peptic ulcer disease   . Retinal artery occlusion    on left    PAST SURGICAL HISTORY: Past Surgical History:  Procedure Laterality Date  . ARTERY BIOPSY Left 11/19/2015   Procedure: BIOPSY TEMPORAL ARTERY;  Surgeon: Algernon Huxley, MD;  Location: [facility] ORS;  Service: Vascular;  Laterality: Left;  . BREAST BIOPSY Left 2002   core- neg  . CARDIAC CATHETERIZATION N/A 08/08/2015   Procedure: Right and Left Heart Cath and Coronary Angiography;  Surgeon: Teodoro Spray, MD;  Location: [place] [facility];  Service: Cardiovascular;  Laterality: N/A;  . CARDIAC CATHETERIZATION Bilateral 09/03/2016   Procedure: Right/Left Heart Cath and Coronary Angiography;  Surgeon: Teodoro Spray, MD;  Location: [place] [facility];  Service: Cardiovascular;  Laterality: Bilateral;  . CATARACT EXTRACTION W/ INTRAOCULAR LENS  IMPLANT, BILATERAL    . COLONOSCOPY     in 2013- internal hemorrhoids  . COLONOSCOPY WITH PROPOFOL N/A 07/07/2018   Procedure: COLONOSCOPY WITH PROPOFOL;  Surgeon: Manya Silvas, MD;  Location[facility] END[facility]COPY;  Service: Endoscopy;  Laterality: N/A;  . CORONARY ANGIOGRAPHY N/A 09/14/2017   Procedure: CORONARY ANGIOGRAPHY;  Surgeon: Teodoro Spray, MD;  Location: [place] [facility];  Service: Cardiovascular;  Laterality: N/A;  . CORONARY ANGIOPLASTY    . CORONARY STENT PLACEMENT    . ENDARTERECTOMY Left 10/01/2015   Procedure: ENDARTERECTOMY CAROTID;  Surgeon:  Algernon Huxley, MD;  Location: [facility] ORS;  Service: Vascular;  Laterality: Left;  . ESOPHAGOGASTRODUODENOSCOPY    . ESOPHAGOGASTRODUODENOSCOPY (EGD) WITH PROPOFOL N/A 05/31/2016   Procedure: ESOPHAGOGASTRODUODENOSCOPY (EGD) WITH PROPOFOL;  Surgeon: Manya Silvas, MD;  Location:  [facility] ENDOSCOPY;  Service: Endoscopy;  Laterality: N/A;  . ESOPHAGOGASTRODUODENOSCOPY (EGD) WITH PROPOFOL N/A 07/07/2018   Procedure: ESOPHAGOGASTRODUODENOSCOPY (EGD) WITH PROPOFOL;  Surgeon: Manya Silvas, MD;  Location: [facility] ENDOSCOPY;  Service: Endoscopy;  Laterality: N/A;  . ESOPHAGOGASTRODUODENOSCOPY (EGD) WITH PROPOFOL N/A 07/17/2018   Procedure: ESOPHAGOGASTRODUODENOSCOPY (EGD) WITH PROPOFOL;  Surgeon: Lucilla Lame, MD;  Location: [facility] ENDOSCOPY;  Service: Endoscopy;  Laterality: N/A;  . EYE SURGERY    . GIVENS CAPSULE STUDY N/A 04/17/2013   Procedure: GIVENS CAPSULE STUDY;  Surgeon: Arta Silence, MD;  Location: [facility] ENDOSCOPY;  Service: Endoscopy;  Laterality: N/A;  patient ate breakfast at 7am   . PARTIAL HYSTERECTOMY    . RIGHT AND LEFT HEART CATH Bilateral 09/14/2017   Procedure: RIGHT AND LEFT HEART CATH;  Surgeon: Teodoro Spray, MD;  Location: [place] [facility];  Service: Cardiovascular;  Laterality: Bilateral;  . TRIGGER FINGER RELEASE      FAMILY HISTORY Family History  Problem Relation Age of Onset  . Uterine cancer Mother   . Breast cancer Mother 50  . Seizures Father   . Stroke Father   . Diabetes Father   . COPD Father   . Colon cancer Neg Hx        ADVANCED DIRECTIVES:    HEALTH MAINTENANCE: Social History   Tobacco Use  . Smoking status: Former Smoker    Last attempt to quit: 02/15/1985    Years since quitting: 33.5  . Smokeless tobacco: Never Used  . Tobacco comment: quit about 31 years ago  Substance Use Topics  . Alcohol use: No    Alcohol/week: 0.0 standard drinks  . Drug use: No     Colonoscopy:  PAP:  Bone density:  Lipid panel:  Allergies  Allergen Reactions  . Aspirin Other (See Comments)    ulcers  . Invokamet [Canagliflozin-Metformin Hcl] Other (See Comments)    Yeast infection  . Sulfa Antibiotics Other (See Comments)    "Got Drunk"  . Lovastatin Rash  . Penicillins Rash    Has patient had a PCN reaction causing immediate  rash, facial/tongue/throat swelling, SOB or lightheadedness with hypotension:Yes Has patient had a PCN reaction causing severe rash involving mucus membranes or skin necrosis:all over body Has patient had a PCN reaction that required hospitalization: No Has patient had a PCN reaction occurring within the last 10 years: No If all of the above answers are "NO", then may proceed with Cephalosporin use.   . Vicodin [Hydrocodone-Acetaminophen] Rash    Current Outpatient Medications  Medication Sig Dispense Refill  . albuterol (PROAIR HFA) 108 (90 Base) MCG/ACT inhaler Inhale 2 puffs into the lungs every 4 (four) hours as needed. For shortness of breath/wheezing.    Marland Kitchen alendronate (FOSAMAX) 70 MG tablet Take 70 mg by mouth once a week. Take with a full glass of water on an empty stomach. (Tuesdays)    . amitriptyline (ELAVIL) 25 MG tablet Take 25 mg by mouth at bedtime.     . Ascorbic Acid (VITAMIN C) 1000 MG tablet Take 1,000 mg by mouth daily.    . Biotin (RA BIOTIN) 1000 MCG tablet Take 1,000 mcg by mouth daily.    . COLLAGEN PO Take 15 mLs by mouth daily with breakfast.  Mix with coffee    . Emollient (CERAVE) CREA Apply 1 application topically daily.    . Flaxseed, Linseed, (FLAXSEED OIL) 1000 MG CAPS Take 1,200 mg by mouth daily.     . fluticasone (FLONASE) 50 MCG/ACT nasal spray Place into the nose.    . gabapentin (NEURONTIN) 300 MG capsule Take 300 mg by mouth at bedtime.     . insulin NPH-regular Human (NOVOLIN 70/30) (70-30) 100 UNIT/ML injection Inject 27-50 Units into the skin 2 (two) times daily. 50 units in the morning, & 27 units in the afternoon.    Marland Kitchen losartan (COZAAR) 50 MG tablet Take 1 tablet (50 mg total) by mouth daily with breakfast. 60 tablet 0  . metoprolol tartrate (LOPRESSOR) 50 MG tablet Take 2 tablets (100 mg total) by mouth 2 (two) times daily. 60 tablet 0  . Multiple Vitamins-Iron (MULTIVITAMINS WITH IRON) TABS tablet Take 1 tablet by mouth daily.    . Omega-3 Fatty  Acids (FISH OIL) 500 MG CAPS Take 500 mg by mouth daily.    Marland Kitchen omeprazole (PRILOSEC) 40 MG capsule Take 40 mg by mouth 2 (two) times daily.     Marland Kitchen OVER THE COUNTER MEDICATION Apply 1 application topically at bedtime. REVITALIFT    . pravastatin (PRAVACHOL) 10 MG tablet Take 1 tablet (10 mg total) by mouth daily at 6 PM. 30 tablet 0  . sucralfate (CARAFATE) 1 g tablet DISSOLVE 1 TABLET IN 15ML OF WATER TO CREATE A SLURRY. TAKE BY MOUTH THREE TIMES DAILY BEFORE MEAL(S).  2  . tiotropium (SPIRIVA HANDIHALER) 18 MCG inhalation capsule Place 18 mcg into inhaler and inhale daily.     . traMADol (ULTRAM) 50 MG tablet Take 50 mg by mouth every 6 (six) hours as needed (for pain.).    Marland Kitchen TURMERIC PO Take 1,000 mg by mouth daily.     No current facility-administered medications for this visit.    Facility-Administered Medications Ordered in Other Visits  Medication Dose Route Frequency Provider Last Rate Last Dose  . heparin lock flush 100 unit/mL  500 Units Intravenous Once Verlon Au, NP      . sodium chloride flush (NS) 0.9 % injection 10 mL  10 mL Intravenous Once Verlon Au, NP        OBJECTIVE: Vitals:   08/08/18 0856  BP: (!) 146/77  Pulse: 76  Resp: 20  Temp: (!) 96.7 F (35.9 C)     Body mass index is 35.48 kg/m.    ECOG FS:1 - Symptomatic but completely ambulatory  General: Well-developed, well-nourished, no acute distress. Eyes: Pink conjunctiva, anicteric sclera. HEENT: Normocephalic, moist mucous membranes. Lungs: Clear to auscultation bilaterally. Heart: Regular rate and rhythm. No rubs, murmurs, or gallops. Abdomen: Soft, nontender, nondistended. No organomegaly noted, normoactive bowel sounds. Musculoskeletal: No edema, cyanosis, or clubbing. Neuro: Alert, answering all questions appropriately. Cranial nerves grossly intact. Skin: No rashes or petechiae noted. Psych: Normal affect.   LAB RESULTS:  Lab Results  Component Value Date   NA 141 07/18/2018   K 4.2  07/18/2018   CL 110 07/18/2018   CO2 27 07/18/2018   GLUCOSE 97 07/18/2018   BUN 18 07/18/2018   CREATININE 0.98 07/18/2018   CALCIUM 8.4 (L) 07/18/2018   PROT 7.0 07/16/2018   ALBUMIN 3.8 07/16/2018   AST 22 07/16/2018   ALT 14 07/16/2018   ALKPHOS 65 07/16/2018   BILITOT 0.4 07/16/2018   GFRNONAA 57 (L) 07/18/2018   GFRAA >60 07/18/2018  Lab Results  Component Value Date   WBC 3.4 (L) 08/08/2018   NEUTROABS 1.8 08/08/2018   HGB 9.3 (L) 08/08/2018   HCT 27.3 (L) 08/08/2018   MCV 90.2 08/08/2018   PLT 270 08/08/2018   Lab Results  Component Value Date   IRON 51 08/02/2018   TIBC 284 08/02/2018   IRONPCTSAT 18 08/02/2018    Lab Results  Component Value Date   FERRITIN 299 08/02/2018     STUDIES: Dg Chest 2 View  Result Date: 07/16/2018 CLINICAL DATA:  Chest pain starting today in worse throughout the day. Shortness of breath on ambulation. History of anemia. EXAM: CHEST - 2 VIEW COMPARISON:  09/06/2016 FINDINGS: Borderline heart size with normal pulmonary vascularity. Mild linear fibrosis or atelectasis in the lung bases is similar to previous study. No focal consolidation. No blunting of costophrenic angles. No pneumothorax. Mediastinal contours appear intact. Degenerative changes in the spine. IMPRESSION: Borderline heart size. Mild linear fibrosis or atelectasis in the lung bases. No evidence of active pulmonary disease. Electronically Signed   By: Lucienne Capers M.D.   On: 07/16/2018 22:50   Ct Abdomen Pelvis W Contrast  Result Date: 07/17/2018 CLINICAL DATA:  72 year old female with GI bleed. History of bleeding ulcer. Gastritis noted on 07/07/2018 endoscopy. Small colonic polyps noted and removed during colonoscopy 07/07/2018 (negative for malignancy). Post hysterectomy. Initial encounter. EXAM: CT ABDOMEN AND PELVIS WITH CONTRAST TECHNIQUE: Multidetector CT imaging of the abdomen and pelvis was performed using the standard protocol following bolus  administration of intravenous contrast. CONTRAST:  168m ISOVUE-300 IOPAMIDOL (ISOVUE-300) INJECTION 61% COMPARISON:  None. FINDINGS: Lower chest: Minimal basilar atelectasis/scarring. Top-normal heart size. Aortic mitral valve calcification. Coronary artery calcification. Hepatobiliary: Enlarged liver spanning over 18.3 cm. No worrisome hepatic lesion. Partially contracted gallbladder. No calcified gallstone or common bile duct stone. Pancreas: No worrisome pancreatic mass or inflammation. Spleen: No splenic mass or enlargement. Adrenals/Urinary Tract: No obstructing stone or hydronephrosis. No worrisome renal or adrenal lesion. Noncontrast filled views the urinary bladder without gross abnormality. Stomach/Bowel: Malposition bowel with the ligament of Treitz to the right of midline and cecum displaced medially and superiorly with the appendix to left of midline draping toward central aspect. Scattered diverticula without evidence of diverticulitis. Stomach under distended and evaluation limited. Vascular/Lymphatic: Atherosclerotic changes aorta and aortic branch vessels. No abdominal aortic aneurysm or large vessel occlusion. The superior mesenteric artery is located slightly posteriorly with respect to the superior mesenteric vein. Scattered small lymph nodes without adenopathy. Reproductive: Post hysterectomy.  Coarse calcifications left adnexa. Other: No free air or bowel containing hernia. Musculoskeletal: Degenerative changes thoracic and lumbar spine with mild scoliosis lumbar spine convex right. IMPRESSION: Malposition bowel with the ligament of Treitz to the right of midline and cecum displaced medially and superiorly. Scattered diverticula without evidence of diverticulitis. Stomach under distended and evaluation limited. Aortic Atherosclerosis (ICD10-I70.0). Post hysterectomy. Coarse calcification left adnexa of indeterminate etiology or significance. Enlarged liver spanning over 18.3 cm possibly with  fatty infiltration. Partially contracted gallbladder without calcified gallstone. Electronically Signed   By: SGenia DelM.D.   On: 07/17/2018 08:14    ASSESSMENT: Iron deficiency anemia, secondary to chronic blood loss.  PLAN:    1. Iron deficiency anemia, secondary to chronic blood loss: Patient's hemoglobin is still decreased, but improved since recent blood transfusion.  Her iron stores are within normal limits.  Previously, the remainder of her laboratory work is either negative or within normal limits.  Bone marrow biopsy completed on February 15, 2017 did not reveal any significant pathology. She does not require IV Feraheme today.  Patient last received treatment on July 25, 2018.  She does not require blood transfusion.  Return to clinic in 2 weeks with repeat laboratory work and further evaluation.  2. Melena: Continue follow-up with GI as indicated.  Her most recent colonoscopy was on July 07, 2018.  She had an upper endoscopy also on July 07, 2018 and then a repeat endoscopy on July 17, 2018.  3. Chest pain/shortness of breath: Patient does not complain of this today.  Continue follow-up with cardiology as needed.  4.  Hypertension: Patient's blood pressure is mildly elevated today, continue monitoring and treatment per primary care.   Patient expressed understanding and was in agreement with this plan. She also understands that She can call clinic at any time with any questions, concerns, or complaints.    Lloyd Huger, MD 08/11/18 3:04 PM

## 2018-08-07 ENCOUNTER — Other Ambulatory Visit: Payer: Self-pay | Admitting: *Deleted

## 2018-08-07 DIAGNOSIS — D649 Anemia, unspecified: Secondary | ICD-10-CM

## 2018-08-08 ENCOUNTER — Inpatient Hospital Stay: Payer: Medicare HMO

## 2018-08-08 ENCOUNTER — Encounter: Payer: Self-pay | Admitting: Oncology

## 2018-08-08 ENCOUNTER — Inpatient Hospital Stay (HOSPITAL_BASED_OUTPATIENT_CLINIC_OR_DEPARTMENT_OTHER): Payer: Medicare HMO | Admitting: Oncology

## 2018-08-08 VITALS — BP 146/77 | HR 76 | Temp 96.7°F | Resp 20 | Wt 200.3 lb

## 2018-08-08 DIAGNOSIS — I10 Essential (primary) hypertension: Secondary | ICD-10-CM | POA: Diagnosis not present

## 2018-08-08 DIAGNOSIS — E1159 Type 2 diabetes mellitus with other circulatory complications: Secondary | ICD-10-CM | POA: Diagnosis not present

## 2018-08-08 DIAGNOSIS — K921 Melena: Secondary | ICD-10-CM

## 2018-08-08 DIAGNOSIS — D500 Iron deficiency anemia secondary to blood loss (chronic): Secondary | ICD-10-CM

## 2018-08-08 DIAGNOSIS — Z87891 Personal history of nicotine dependence: Secondary | ICD-10-CM | POA: Diagnosis not present

## 2018-08-08 DIAGNOSIS — E1165 Type 2 diabetes mellitus with hyperglycemia: Secondary | ICD-10-CM | POA: Diagnosis not present

## 2018-08-08 DIAGNOSIS — E042 Nontoxic multinodular goiter: Secondary | ICD-10-CM | POA: Diagnosis not present

## 2018-08-08 DIAGNOSIS — D649 Anemia, unspecified: Secondary | ICD-10-CM

## 2018-08-08 LAB — CBC WITH DIFFERENTIAL/PLATELET
Basophils Absolute: 0 10*3/uL (ref 0–0.1)
Basophils Relative: 1 %
Eosinophils Absolute: 0.2 10*3/uL (ref 0–0.7)
Eosinophils Relative: 7 %
HEMATOCRIT: 27.3 % — AB (ref 35.0–47.0)
HEMOGLOBIN: 9.3 g/dL — AB (ref 12.0–16.0)
LYMPHS ABS: 0.9 10*3/uL — AB (ref 1.0–3.6)
LYMPHS PCT: 27 %
MCH: 30.9 pg (ref 26.0–34.0)
MCHC: 34.2 g/dL (ref 32.0–36.0)
MCV: 90.2 fL (ref 80.0–100.0)
MONO ABS: 0.4 10*3/uL (ref 0.2–0.9)
MONOS PCT: 12 %
NEUTROS ABS: 1.8 10*3/uL (ref 1.4–6.5)
NEUTROS PCT: 53 %
Platelets: 270 10*3/uL (ref 150–440)
RBC: 3.03 MIL/uL — ABNORMAL LOW (ref 3.80–5.20)
RDW: 19.8 % — AB (ref 11.5–14.5)
WBC: 3.4 10*3/uL — ABNORMAL LOW (ref 3.6–11.0)

## 2018-08-08 LAB — SAMPLE TO BLOOD BANK

## 2018-08-08 NOTE — Progress Notes (Signed)
Patient denies any concerns today.  

## 2018-08-09 ENCOUNTER — Other Ambulatory Visit: Payer: Self-pay | Admitting: *Deleted

## 2018-08-09 ENCOUNTER — Encounter: Payer: Self-pay | Admitting: Oncology

## 2018-08-09 DIAGNOSIS — D649 Anemia, unspecified: Secondary | ICD-10-CM

## 2018-08-15 DIAGNOSIS — E1129 Type 2 diabetes mellitus with other diabetic kidney complication: Secondary | ICD-10-CM | POA: Diagnosis not present

## 2018-08-15 DIAGNOSIS — E1142 Type 2 diabetes mellitus with diabetic polyneuropathy: Secondary | ICD-10-CM | POA: Diagnosis not present

## 2018-08-15 DIAGNOSIS — Z794 Long term (current) use of insulin: Secondary | ICD-10-CM | POA: Diagnosis not present

## 2018-08-15 DIAGNOSIS — I10 Essential (primary) hypertension: Secondary | ICD-10-CM | POA: Diagnosis not present

## 2018-08-15 DIAGNOSIS — R809 Proteinuria, unspecified: Secondary | ICD-10-CM | POA: Diagnosis not present

## 2018-08-15 DIAGNOSIS — M810 Age-related osteoporosis without current pathological fracture: Secondary | ICD-10-CM | POA: Diagnosis not present

## 2018-08-15 DIAGNOSIS — R69 Illness, unspecified: Secondary | ICD-10-CM | POA: Diagnosis not present

## 2018-08-15 DIAGNOSIS — E1159 Type 2 diabetes mellitus with other circulatory complications: Secondary | ICD-10-CM | POA: Diagnosis not present

## 2018-08-15 DIAGNOSIS — E042 Nontoxic multinodular goiter: Secondary | ICD-10-CM | POA: Diagnosis not present

## 2018-08-20 NOTE — Progress Notes (Signed)
[place]  [phone] [phone]  ID: ZYONNA VARDAMAN OB: [birth year]  MR#: [id number]  KDT#:[id number]  Patient Care Team: Maryland Pink, MD as PCP - General (Family Medicine) Manya Silvas, MD (Gastroenterology) Lloyd Huger, MD as Consulting Physician (Hematology and Oncology)  CHIEF COMPLAINT: Iron deficiency anemia, secondary to chronic blood loss.  INTERVAL HISTORY: Patient returns to clinic today for repeat laboratory work and further evaluation.  She continues to have persistent weakness and fatigue and states she has felt worse over the past week.  She continues to have ongoing melena.  She has no neurologic complaints.  She denies any recent fevers.  She denies any chest pain, shortness of breath, cough, or hemoptysis.  She denies any nausea, vomiting, constipation, or diarrhea. She has no urinary complaints.  Patient offers no further specific complaints today.  REVIEW OF SYSTEMS:   Review of Systems  Constitutional: Positive for malaise/fatigue. Negative for fever and weight loss.  Eyes: Negative.  Negative for blurred vision.  Respiratory: Negative for cough, hemoptysis and shortness of breath.   Cardiovascular: Positive for chest pain. Negative for leg swelling.  Gastrointestinal: Positive for melena. Negative for abdominal pain and blood in stool.  Genitourinary: Negative.  Negative for hematuria.  Musculoskeletal: Negative.  Negative for joint pain.  Skin: Negative.  Negative for rash.  Neurological: Positive for weakness. Negative for dizziness, focal weakness and headaches.  Psychiatric/Behavioral: Negative.  The patient is not nervous/anxious.     As per HPI. Otherwise, a complete review of systems is negative.  PAST MEDICAL HISTORY: Past Medical History:  Diagnosis Date  . Anemia    iron deficiency  . Aortic stenosis   . Basal cell carcinoma   . CAD (coronary artery disease)    s/p Left circumflex stent  in 2012  . Carotid stenosis   . Cataract   . High cholesterol   . HTN (hypertension)   . Hyperlipidemia   . IDDM (insulin dependent diabetes mellitus) ([place])   . Multiple thyroid nodules    Benign  . Peptic ulcer disease   . Retinal artery occlusion    on left    PAST SURGICAL HISTORY: Past Surgical History:  Procedure Laterality Date  . ARTERY BIOPSY Left 11/19/2015   Procedure: BIOPSY TEMPORAL ARTERY;  Surgeon: Algernon Huxley, MD;  Location: [facility] ORS;  Service: Vascular;  Laterality: Left;  . BREAST BIOPSY Left 2002   core- neg  . CARDIAC CATHETERIZATION N/A 08/08/2015   Procedure: Right and Left Heart Cath and Coronary Angiography;  Surgeon: Teodoro Spray, MD;  Location: [place] [facility];  Service: Cardiovascular;  Laterality: N/A;  . CARDIAC CATHETERIZATION Bilateral 09/03/2016   Procedure: Right/Left Heart Cath and Coronary Angiography;  Surgeon: Teodoro Spray, MD;  Location: [place] [facility];  Service: Cardiovascular;  Laterality: Bilateral;  . CATARACT EXTRACTION W/ INTRAOCULAR LENS  IMPLANT, BILATERAL    . COLONOSCOPY     in 2013- internal hemorrhoids  . COLONOSCOPY WITH PROPOFOL N/A 07/07/2018   Procedure: COLONOSCOPY WITH PROPOFOL;  Surgeon: Manya Silvas, MD;  Location: [facility] ENDOSCOPY;  Service: Endoscopy;  Laterality: N/A;  . CORONARY ANGIOGRAPHY N/A 09/14/2017   Procedure: CORONARY ANGIOGRAPHY;  Surgeon: Teodoro Spray, MD;  Location: [place] [facility];  Service: Cardiovascular;  Laterality: N/A;  . CORONARY ANGIOPLASTY    . CORONARY STENT PLACEMENT    . ENDARTERECTOMY Left 10/01/2015   Procedure: ENDARTERECTOMY CAROTID;  Surgeon: Algernon Huxley, MD;  Location:  [facility] ORS;  Service: Vascular;  Laterality: Left;  . ESOPHAGOGASTRODUODENOSCOPY    . ESOPHAGOGASTRODUODENOSCOPY (EGD) WITH PROPOFOL N/A 05/31/2016   Procedure: ESOPHAGOGASTRODUODENOSCOPY (EGD) WITH PROPOFOL;  Surgeon: Manya Silvas, MD;  Location: [facility] ENDOSCOPY;  Service: Endoscopy;  Laterality: N/A;    . ESOPHAGOGASTRODUODENOSCOPY (EGD) WITH PROPOFOL N/A 07/07/2018   Procedure: ESOPHAGOGASTRODUODENOSCOPY (EGD) WITH PROPOFOL;  Surgeon: Manya Silvas, MD;  Location: [facility] ENDOSCOPY;  Service: Endoscopy;  Laterality: N/A;  . ESOPHAGOGASTRODUODENOSCOPY (EGD) WITH PROPOFOL N/A 07/17/2018   Procedure: ESOPHAGOGASTRODUODENOSCOPY (EGD) WITH PROPOFOL;  Surgeon: Lucilla Lame, MD;  Location: [facility] ENDOSCOPY;  Service: Endoscopy;  Laterality: N/A;  . EYE SURGERY    . GIVENS CAPSULE STUDY N/A 04/17/2013   Procedure: GIVENS CAPSULE STUDY;  Surgeon: Arta Silence, MD;  Location: [facility] ENDOSCOPY;  Service: Endoscopy;  Laterality: N/A;  patient ate breakfast at 7am   . PARTIAL HYSTERECTOMY    . RIGHT AND LEFT HEART CATH Bilateral 09/14/2017   Procedure: RIGHT AND LEFT HEART CATH;  Surgeon: Teodoro Spray, MD;  Location: [place] [facility];  Service: Cardiovascular;  Laterality: Bilateral;  . TRIGGER FINGER RELEASE      FAMILY HISTORY Family History  Problem Relation Age of Onset  . Uterine cancer Mother   . Breast cancer Mother 26  . Seizures Father   . Stroke Father   . Diabetes Father   . COPD Father   . Colon cancer Neg Hx        ADVANCED DIRECTIVES:    HEALTH MAINTENANCE: Social History   Tobacco Use  . Smoking status: Former Smoker    Last attempt to quit: 02/15/1985    Years since quitting: 33.5  . Smokeless tobacco: Never Used  . Tobacco comment: quit about 31 years ago  Substance Use Topics  . Alcohol use: No    Alcohol/week: 0.0 standard drinks  . Drug use: No     Colonoscopy:  PAP:  Bone density:  Lipid panel:  Allergies  Allergen Reactions  . Aspirin Other (See Comments)    ulcers  . Invokamet [Canagliflozin-Metformin Hcl] Other (See Comments)    Yeast infection  . Sulfa Antibiotics Other (See Comments)    "Got Drunk"  . Lovastatin Rash  . Penicillins Rash    Has patient had a PCN reaction causing immediate rash, facial/tongue/throat swelling, SOB or  lightheadedness with hypotension:Yes Has patient had a PCN reaction causing severe rash involving mucus membranes or skin necrosis:all over body Has patient had a PCN reaction that required hospitalization: No Has patient had a PCN reaction occurring within the last 10 years: No If all of the above answers are "NO", then may proceed with Cephalosporin use.   . Vicodin [Hydrocodone-Acetaminophen] Rash    Current Outpatient Medications  Medication Sig Dispense Refill  . albuterol (PROAIR HFA) 108 (90 Base) MCG/ACT inhaler Inhale 2 puffs into the lungs every 4 (four) hours as needed. For shortness of breath/wheezing.    Marland Kitchen alendronate (FOSAMAX) 70 MG tablet Take 70 mg by mouth once a week. Take with a full glass of water on an empty stomach. (Tuesdays)    . amitriptyline (ELAVIL) 25 MG tablet Take 25 mg by mouth at bedtime.     . Emollient (CERAVE) CREA Apply 1 application topically daily.    . fluticasone (FLONASE) 50 MCG/ACT nasal spray Place into the nose.    . gabapentin (NEURONTIN) 300 MG capsule Take 300 mg by mouth at bedtime.     . insulin NPH-regular Human (NOVOLIN 70/30) (  70-30) 100 UNIT/ML injection Inject 27-50 Units into the skin 2 (two) times daily. 50 units in the morning, & 27 units in the afternoon.    Marland Kitchen losartan (COZAAR) 50 MG tablet Take 1 tablet (50 mg total) by mouth daily with breakfast. 60 tablet 0  . metoprolol tartrate (LOPRESSOR) 50 MG tablet Take 2 tablets (100 mg total) by mouth 2 (two) times daily. 60 tablet 0  . omeprazole (PRILOSEC) 40 MG capsule Take 40 mg by mouth 2 (two) times daily.     Marland Kitchen OVER THE COUNTER MEDICATION Apply 1 application topically at bedtime. REVITALIFT    . pravastatin (PRAVACHOL) 10 MG tablet Take 1 tablet (10 mg total) by mouth daily at 6 PM. 30 tablet 0  . sucralfate (CARAFATE) 1 g tablet DISSOLVE 1 TABLET IN 15ML OF WATER TO CREATE A SLURRY. TAKE BY MOUTH THREE TIMES DAILY BEFORE MEAL(S).  2  . tiotropium (SPIRIVA HANDIHALER) 18 MCG  inhalation capsule Place 18 mcg into inhaler and inhale daily.     . traMADol (ULTRAM) 50 MG tablet Take 50 mg by mouth every 6 (six) hours as needed (for pain.).    Marland Kitchen Ascorbic Acid (VITAMIN C) 1000 MG tablet Take 1,000 mg by mouth daily.    . Biotin (RA BIOTIN) 1000 MCG tablet Take 1,000 mcg by mouth daily.    . COLLAGEN PO Take 15 mLs by mouth daily with breakfast. Mix with coffee    . Flaxseed, Linseed, (FLAXSEED OIL) 1000 MG CAPS Take 1,200 mg by mouth daily.     . Multiple Vitamins-Iron (MULTIVITAMINS WITH IRON) TABS tablet Take 1 tablet by mouth daily.    . Omega-3 Fatty Acids (FISH OIL) 500 MG CAPS Take 500 mg by mouth daily.    . TURMERIC PO Take 1,000 mg by mouth daily.     No current facility-administered medications for this visit.    Facility-Administered Medications Ordered in Other Visits  Medication Dose Route Frequency Provider Last Rate Last Dose  . heparin lock flush 100 unit/mL  500 Units Intravenous Once Verlon Au, NP      . sodium chloride flush (NS) 0.9 % injection 10 mL  10 mL Intravenous Once Verlon Au, NP        OBJECTIVE: Vitals:   08/22/18 0855  BP: (!) 150/92  Pulse: 84  Resp: 20  Temp: 97.6 F (36.4 C)  SpO2: 98%     Body mass index is 35.61 kg/m.    ECOG FS:1 - Symptomatic but completely ambulatory  General: Well-developed, well-nourished, no acute distress. Eyes: Pink conjunctiva, anicteric sclera. HEENT: Normocephalic, moist mucous membranes. Lungs: Clear to auscultation bilaterally. Heart: Regular rate and rhythm. No rubs, murmurs, or gallops. Abdomen: Soft, nontender, nondistended. No organomegaly noted, normoactive bowel sounds. Musculoskeletal: No edema, cyanosis, or clubbing. Neuro: Alert, answering all questions appropriately. Cranial nerves grossly intact. Skin: No rashes or petechiae noted. Psych: Normal affect.  LAB RESULTS:  Lab Results  Component Value Date   NA 141 07/18/2018   K 4.2 07/18/2018   CL 110 07/18/2018     CO2 27 07/18/2018   GLUCOSE 97 07/18/2018   BUN 18 07/18/2018   CREATININE 0.98 07/18/2018   CALCIUM 8.4 (L) 07/18/2018   PROT 7.0 07/16/2018   ALBUMIN 3.8 07/16/2018   AST 22 07/16/2018   ALT 14 07/16/2018   ALKPHOS 65 07/16/2018   BILITOT 0.4 07/16/2018   GFRNONAA 57 (L) 07/18/2018   GFRAA >60 07/18/2018    Lab Results  Component Value Date   WBC 4.4 08/22/2018   NEUTROABS 2.8 08/22/2018   HGB 7.4 (L) 08/22/2018   HCT 22.4 (L) 08/22/2018   MCV 90.1 08/22/2018   PLT 233 08/22/2018   Lab Results  Component Value Date   IRON 20 (L) 08/22/2018   TIBC 262 08/22/2018   IRONPCTSAT 8 (L) 08/22/2018    Lab Results  Component Value Date   FERRITIN 39 08/22/2018     STUDIES: Nm Gi Blood Loss  Result Date: 08/23/2018 CLINICAL DATA:  Melena, intermittent GI bleeding, decreased hemoglobin EXAM: NUCLEAR MEDICINE GASTROINTESTINAL BLEEDING SCAN TECHNIQUE: Sequential abdominal images were obtained following intravenous administration of Tc-11mlabeled red blood cells. RADIOPHARMACEUTICALS:  24.6 mCi Tc-925mertechnetate in-vitro labeled red cells. COMPARISON:  07/17/2018 CT FINDINGS: Imaging of the abdomen and pelvis performed for 2 hours. Normal background vascular activity and excretion into the urinary tract. Negative for significant active or acute gastrointestinal bleeding. IMPRESSION: Negative for significant active GI bleed. Electronically Signed   By: M.Jerilynn Mages Shick M.D.   On: 08/23/2018 15:17    ASSESSMENT: Iron deficiency anemia, secondary to chronic blood loss.  PLAN:    1. Iron deficiency anemia, secondary to chronic blood loss: Patient's hemoglobin has trended down again and she is more symptomatic.  Packed red blood cells today.  Patient states she had a capsule endoscopy recently, but reports the study needs to be repeated.  Nuclear medicine bleeding scan completed on August 23, 2018 did not reveal any significant GI bleed.  Bone marrow biopsy completed on February 15, 2017 did not reveal any significant pathology.  Proceed with 1 unit of blood today.  Return to clinic in 1 week for further evaluation and consideration of additional transfusions. 2. Melena: Continue follow-up with GI as indicated.  Her most recent colonoscopy was on July 07, 2018.  She had an upper endoscopy also on July 07, 2018 and then a repeat endoscopy on July 17, 2018.  Repeat capsule endoscopy in the near future. 3. Chest pain/shortness of breath: Likely secondary to his significant anemia.  Patient has an appointment with cardiology later today.      Patient expressed understanding and was in agreement with this plan. She also understands that She can call clinic at any time with any questions, concerns, or complaints.    TiLloyd HugerMD 08/25/18 11:09 AM

## 2018-08-22 ENCOUNTER — Other Ambulatory Visit: Payer: Self-pay | Admitting: Oncology

## 2018-08-22 ENCOUNTER — Encounter: Payer: Self-pay | Admitting: Nurse Practitioner

## 2018-08-22 ENCOUNTER — Inpatient Hospital Stay (HOSPITAL_BASED_OUTPATIENT_CLINIC_OR_DEPARTMENT_OTHER): Payer: Medicare HMO | Admitting: Oncology

## 2018-08-22 ENCOUNTER — Inpatient Hospital Stay: Payer: Medicare HMO

## 2018-08-22 ENCOUNTER — Other Ambulatory Visit: Payer: Self-pay

## 2018-08-22 VITALS — BP 150/92 | HR 84 | Temp 97.6°F | Resp 20 | Wt 201.0 lb

## 2018-08-22 DIAGNOSIS — I350 Nonrheumatic aortic (valve) stenosis: Secondary | ICD-10-CM | POA: Diagnosis not present

## 2018-08-22 DIAGNOSIS — Z87891 Personal history of nicotine dependence: Secondary | ICD-10-CM

## 2018-08-22 DIAGNOSIS — R002 Palpitations: Secondary | ICD-10-CM | POA: Diagnosis not present

## 2018-08-22 DIAGNOSIS — I10 Essential (primary) hypertension: Secondary | ICD-10-CM | POA: Diagnosis not present

## 2018-08-22 DIAGNOSIS — D500 Iron deficiency anemia secondary to blood loss (chronic): Secondary | ICD-10-CM | POA: Diagnosis not present

## 2018-08-22 DIAGNOSIS — G8929 Other chronic pain: Secondary | ICD-10-CM | POA: Diagnosis not present

## 2018-08-22 DIAGNOSIS — E1159 Type 2 diabetes mellitus with other circulatory complications: Secondary | ICD-10-CM | POA: Diagnosis not present

## 2018-08-22 DIAGNOSIS — D649 Anemia, unspecified: Secondary | ICD-10-CM

## 2018-08-22 DIAGNOSIS — M545 Low back pain: Secondary | ICD-10-CM

## 2018-08-22 DIAGNOSIS — I2510 Atherosclerotic heart disease of native coronary artery without angina pectoris: Secondary | ICD-10-CM | POA: Diagnosis not present

## 2018-08-22 DIAGNOSIS — K921 Melena: Secondary | ICD-10-CM | POA: Diagnosis not present

## 2018-08-22 DIAGNOSIS — R079 Chest pain, unspecified: Secondary | ICD-10-CM | POA: Diagnosis not present

## 2018-08-22 DIAGNOSIS — R710 Precipitous drop in hematocrit: Secondary | ICD-10-CM

## 2018-08-22 DIAGNOSIS — E7800 Pure hypercholesterolemia, unspecified: Secondary | ICD-10-CM | POA: Diagnosis not present

## 2018-08-22 DIAGNOSIS — R0789 Other chest pain: Secondary | ICD-10-CM | POA: Diagnosis not present

## 2018-08-22 LAB — CBC WITH DIFFERENTIAL/PLATELET
BASOS ABS: 0 10*3/uL (ref 0–0.1)
BASOS PCT: 1 %
EOS ABS: 0.2 10*3/uL (ref 0–0.7)
EOS PCT: 5 %
HCT: 22.4 % — ABNORMAL LOW (ref 35.0–47.0)
Hemoglobin: 7.4 g/dL — ABNORMAL LOW (ref 12.0–16.0)
Lymphocytes Relative: 20 %
Lymphs Abs: 0.9 10*3/uL — ABNORMAL LOW (ref 1.0–3.6)
MCH: 29.7 pg (ref 26.0–34.0)
MCHC: 33 g/dL (ref 32.0–36.0)
MCV: 90.1 fL (ref 80.0–100.0)
MONO ABS: 0.5 10*3/uL (ref 0.2–0.9)
Monocytes Relative: 10 %
Neutro Abs: 2.8 10*3/uL (ref 1.4–6.5)
Neutrophils Relative %: 64 %
PLATELETS: 233 10*3/uL (ref 150–440)
RBC: 2.49 MIL/uL — ABNORMAL LOW (ref 3.80–5.20)
RDW: 17.3 % — AB (ref 11.5–14.5)
WBC: 4.4 10*3/uL (ref 3.6–11.0)

## 2018-08-22 LAB — VITAMIN B12: Vitamin B-12: 288 pg/mL (ref 180–914)

## 2018-08-22 LAB — IRON AND TIBC
IRON: 20 ug/dL — AB (ref 28–170)
Saturation Ratios: 8 % — ABNORMAL LOW (ref 10.4–31.8)
TIBC: 262 ug/dL (ref 250–450)
UIBC: 242 ug/dL

## 2018-08-22 LAB — FERRITIN: FERRITIN: 39 ng/mL (ref 11–307)

## 2018-08-22 LAB — SAMPLE TO BLOOD BANK

## 2018-08-22 LAB — PREPARE RBC (CROSSMATCH)

## 2018-08-22 MED ORDER — ACETAMINOPHEN 325 MG PO TABS
650.0000 mg | ORAL_TABLET | Freq: Once | ORAL | Status: AC
  Administered 2018-08-22: 650 mg via ORAL
  Filled 2018-08-22: qty 2

## 2018-08-22 MED ORDER — DIPHENHYDRAMINE HCL 50 MG/ML IJ SOLN
25.0000 mg | Freq: Once | INTRAMUSCULAR | Status: AC
  Administered 2018-08-22: 25 mg via INTRAVENOUS
  Filled 2018-08-22: qty 1

## 2018-08-22 MED ORDER — SODIUM CHLORIDE 0.9% IV SOLUTION
250.0000 mL | Freq: Once | INTRAVENOUS | Status: AC
  Administered 2018-08-22: 250 mL via INTRAVENOUS
  Filled 2018-08-22: qty 250

## 2018-08-23 ENCOUNTER — Ambulatory Visit
Admission: RE | Admit: 2018-08-23 | Discharge: 2018-08-23 | Disposition: A | Discharge status: Home / Self Care | Payer: Medicare HMO | Source: Ambulatory Visit | Attending: Oncology | Admitting: Oncology

## 2018-08-23 ENCOUNTER — Encounter: Payer: Self-pay | Admitting: Oncology

## 2018-08-23 DIAGNOSIS — R710 Precipitous drop in hematocrit: Secondary | ICD-10-CM | POA: Insufficient documentation

## 2018-08-23 DIAGNOSIS — K921 Melena: Secondary | ICD-10-CM | POA: Diagnosis not present

## 2018-08-23 LAB — BPAM RBC
BLOOD PRODUCT EXPIRATION DATE: 201909232359
ISSUE DATE / TIME: 201908271345
UNIT TYPE AND RH: 5100

## 2018-08-23 LAB — TYPE AND SCREEN
ABO/RH(D): O POS
ANTIBODY SCREEN: NEGATIVE
UNIT DIVISION: 0

## 2018-08-23 MED ORDER — TECHNETIUM TC 99M-LABELED RED BLOOD CELLS IV KIT
25.0000 | PACK | Freq: Once | INTRAVENOUS | Status: AC | PRN
  Administered 2018-08-23: 24.66 via INTRAVENOUS

## 2018-08-25 ENCOUNTER — Other Ambulatory Visit: Payer: Self-pay | Admitting: *Deleted

## 2018-08-25 DIAGNOSIS — D509 Iron deficiency anemia, unspecified: Secondary | ICD-10-CM

## 2018-08-25 DIAGNOSIS — R69 Illness, unspecified: Secondary | ICD-10-CM | POA: Diagnosis not present

## 2018-08-28 NOTE — Progress Notes (Signed)
[place]  [phone]) [phone] Fax:(336) [phone]  ID: Joyce Potter OB: Dec 12, 1946  MR#: [id number]  WSF#:[id number]  Patient Care Team: [place] Pink, MD as PCP - General (Family Medicine) Manya Silvas, MD (Gastroenterology) Lloyd Huger, MD as Consulting Physician (Hematology and Oncology)  CHIEF COMPLAINT: Iron deficiency anemia, secondary to chronic blood loss.  INTERVAL HISTORY: Patient returns to clinic today for repeat laboratory work and further evaluation.  Her weakness and fatigue have only mildly improved.  She has continued ongoing melena.  She has no neurologic complaints.  She denies any recent fevers.  She denies any further chest pain, shortness of breath, cough, or hemoptysis.  She denies any nausea, vomiting, constipation, or diarrhea. She has no urinary complaints.  Patient offers no further specific complaints today.  REVIEW OF SYSTEMS:   Review of Systems  Constitutional: Positive for malaise/fatigue. Negative for fever and weight loss.  Eyes: Negative.  Negative for blurred vision.  Respiratory: Negative for cough, hemoptysis and shortness of breath.   Cardiovascular: Negative.  Negative for chest pain and leg swelling.  Gastrointestinal: Positive for melena. Negative for abdominal pain and blood in stool.  Genitourinary: Negative.  Negative for hematuria.  Musculoskeletal: Negative.  Negative for joint pain.  Skin: Negative.  Negative for rash.  Neurological: Positive for weakness. Negative for dizziness, focal weakness and headaches.  Psychiatric/Behavioral: Negative.  The patient is not nervous/anxious.     As per HPI. Otherwise, a complete review of systems is negative.  PAST MEDICAL HISTORY: Past Medical History:  Diagnosis Date  . Anemia    iron deficiency  . Aortic stenosis   . Basal cell carcinoma   . CAD (coronary artery disease)    s/p Left circumflex stent in 2012  . Carotid stenosis   . Cataract   .  High cholesterol   . HTN (hypertension)   . Hyperlipidemia   . IDDM (insulin dependent diabetes mellitus) ([place])   . Multiple thyroid nodules    Benign  . Peptic ulcer disease   . Retinal artery occlusion    on left    PAST SURGICAL HISTORY: Past Surgical History:  Procedure Laterality Date  . ARTERY BIOPSY Left 11/19/2015   Procedure: BIOPSY TEMPORAL ARTERY;  Surgeon: Algernon Huxley, MD;  Location: [facility] ORS;  Service: Vascular;  Laterality: Left;  . BREAST BIOPSY Left 2002   core- neg  . CARDIAC CATHETERIZATION N/A 08/08/2015   Procedure: Right and Left Heart Cath and Coronary Angiography;  Surgeon: Teodoro Spray, MD;  Location: [place] [facility];  Service: Cardiovascular;  Laterality: N/A;  . CARDIAC CATHETERIZATION Bilateral 09/03/2016   Procedure: Right/Left Heart Cath and Coronary Angiography;  Surgeon: Teodoro Spray, MD;  Location: [place] [facility];  Service: Cardiovascular;  Laterality: Bilateral;  . CATARACT EXTRACTION W/ INTRAOCULAR LENS  IMPLANT, BILATERAL    . COLONOSCOPY     in 2013- internal hemorrhoids  . COLONOSCOPY WITH PROPOFOL N/A 07/07/2018   Procedure: COLONOSCOPY WITH PROPOFOL;  Surgeon: Manya Silvas, MD;  Location: [facility] ENDOSCOPY;  Service: Endoscopy;  Laterality: N/A;  . CORONARY ANGIOGRAPHY N/A 09/14/2017   Procedure: CORONARY ANGIOGRAPHY;  Surgeon: Teodoro Spray, MD;  Location: [place] [facility];  Service: Cardiovascular;  Laterality: N/A;  . CORONARY ANGIOPLASTY    . CORONARY STENT PLACEMENT    . ENDARTERECTOMY Left 10/01/2015   Procedure: ENDARTERECTOMY CAROTID;  Surgeon: Algernon Huxley, MD;  Location: [facility] ORS;  Service: Vascular;  Laterality: Left;  .  ESOPHAGOGASTRODUODENOSCOPY    . ESOPHAGOGASTRODUODENOSCOPY (EGD) WITH PROPOFOL N/A 05/31/2016   Procedure: ESOPHAGOGASTRODUODENOSCOPY (EGD) WITH PROPOFOL;  Surgeon: Manya Silvas, MD;  Location: [facility] ENDOSCOPY;  Service: Endoscopy;  Laterality: N/A;  . ESOPHAGOGASTRODUODENOSCOPY (EGD) WITH PROPOFOL  N/A 07/07/2018   Procedure: ESOPHAGOGASTRODUODENOSCOPY (EGD) WITH PROPOFOL;  Surgeon: Manya Silvas, MD;  Location: [facility] ENDOSCOPY;  Service: Endoscopy;  Laterality: N/A;  . ESOPHAGOGASTRODUODENOSCOPY (EGD) WITH PROPOFOL N/A 07/17/2018   Procedure: ESOPHAGOGASTRODUODENOSCOPY (EGD) WITH PROPOFOL;  Surgeon: Lucilla Lame, MD;  Location: [facility] ENDOSCOPY;  Service: Endoscopy;  Laterality: N/A;  . EYE SURGERY    . GIVENS CAPSULE STUDY N/A 04/17/2013   Procedure: GIVENS CAPSULE STUDY;  Surgeon: Arta Silence, MD;  Location: [facility] ENDOSCOPY;  Service: Endoscopy;  Laterality: N/A;  patient ate breakfast at 7am   . PARTIAL HYSTERECTOMY    . RIGHT AND LEFT HEART CATH Bilateral 09/14/2017   Procedure: RIGHT AND LEFT HEART CATH;  Surgeon: Teodoro Spray, MD;  Location: [place] [facility];  Service: Cardiovascular;  Laterality: Bilateral;  . TRIGGER FINGER RELEASE      FAMILY HISTORY Family History  Problem Relation Age of Onset  . Uterine cancer Mother   . Breast cancer Mother 85  . Seizures Father   . Stroke Father   . Diabetes Father   . COPD Father   . Colon cancer Neg Hx        ADVANCED DIRECTIVES:    HEALTH MAINTENANCE: Social History   Tobacco Use  . Smoking status: Former Smoker    Last attempt to quit: 02/15/1985    Years since quitting: 33.5  . Smokeless tobacco: Never Used  . Tobacco comment: quit about 31 years ago  Substance Use Topics  . Alcohol use: No    Alcohol/week: 0.0 standard drinks  . Drug use: No     Colonoscopy:  PAP:  Bone density:  Lipid panel:  Allergies  Allergen Reactions  . Aspirin Other (See Comments)    ulcers  . Invokamet [Canagliflozin-Metformin Hcl] Other (See Comments)    Yeast infection  . Sulfa Antibiotics Other (See Comments)    "Got Drunk"  . Lovastatin Rash  . Penicillins Rash    Has patient had a PCN reaction causing immediate rash, facial/tongue/throat swelling, SOB or lightheadedness with hypotension:Yes Has patient had a PCN  reaction causing severe rash involving mucus membranes or skin necrosis:all over body Has patient had a PCN reaction that required hospitalization: No Has patient had a PCN reaction occurring within the last 10 years: No If all of the above answers are "NO", then may proceed with Cephalosporin use.   . Vicodin [Hydrocodone-Acetaminophen] Rash    Current Outpatient Medications  Medication Sig Dispense Refill  . albuterol (PROAIR HFA) 108 (90 Base) MCG/ACT inhaler Inhale 2 puffs into the lungs every 4 (four) hours as needed. For shortness of breath/wheezing.    Marland Kitchen alendronate (FOSAMAX) 70 MG tablet Take 70 mg by mouth once a week. Take with a full glass of water on an empty stomach. (Tuesdays)    . amitriptyline (ELAVIL) 25 MG tablet Take 25 mg by mouth at bedtime.     . fluticasone (FLONASE) 50 MCG/ACT nasal spray Place into the nose.    . gabapentin (NEURONTIN) 300 MG capsule Take 300 mg by mouth at bedtime.     . insulin NPH-regular Human (NOVOLIN 70/30) (70-30) 100 UNIT/ML injection Inject 27-50 Units into the skin 2 (two) times daily. 50 units in the morning, & 27 units in  the afternoon.    . isosorbide mononitrate (IMDUR) 30 MG 24 hr tablet Take by mouth.    . losartan (COZAAR) 50 MG tablet Take 1 tablet (50 mg total) by mouth daily with breakfast. 60 tablet 0  . metoprolol tartrate (LOPRESSOR) 50 MG tablet Take 2 tablets (100 mg total) by mouth 2 (two) times daily. 60 tablet 0  . omeprazole (PRILOSEC) 40 MG capsule Take 40 mg by mouth 2 (two) times daily.     . pravastatin (PRAVACHOL) 10 MG tablet Take 1 tablet (10 mg total) by mouth daily at 6 PM. 30 tablet 0  . sucralfate (CARAFATE) 1 g tablet DISSOLVE 1 TABLET IN 15ML OF WATER TO CREATE A SLURRY. TAKE BY MOUTH THREE TIMES DAILY BEFORE MEAL(S).  2  . tiotropium (SPIRIVA HANDIHALER) 18 MCG inhalation capsule Place 18 mcg into inhaler and inhale daily.     . Ascorbic Acid (VITAMIN C) 1000 MG tablet Take 1,000 mg by mouth daily.    .  Biotin (RA BIOTIN) 1000 MCG tablet Take 1,000 mcg by mouth daily.    . COLLAGEN PO Take 15 mLs by mouth daily with breakfast. Mix with coffee    . Emollient (CERAVE) CREA Apply 1 application topically daily.    . Flaxseed, Linseed, (FLAXSEED OIL) 1000 MG CAPS Take 1,200 mg by mouth daily.     . Multiple Vitamins-Iron (MULTIVITAMINS WITH IRON) TABS tablet Take 1 tablet by mouth daily.    . Omega-3 Fatty Acids (FISH OIL) 500 MG CAPS Take 500 mg by mouth daily.    Marland Kitchen OVER THE COUNTER MEDICATION Apply 1 application topically at bedtime. REVITALIFT    . traMADol (ULTRAM) 50 MG tablet Take 50 mg by mouth every 6 (six) hours as needed (for pain.).    Marland Kitchen TURMERIC PO Take 1,000 mg by mouth daily.     No current facility-administered medications for this visit.    Facility-Administered Medications Ordered in Other Visits  Medication Dose Route Frequency Provider Last Rate Last Dose  . heparin lock flush 100 unit/mL  500 Units Intravenous Once Verlon Au, NP      . sodium chloride flush (NS) 0.9 % injection 10 mL  10 mL Intravenous Once Verlon Au, NP        OBJECTIVE: Vitals:   08/29/18 0913  BP: 136/70  Pulse: 63  Resp: 18  Temp: 98 F (36.7 C)  SpO2: 97%     Body mass index is 35.26 kg/m.    ECOG FS:1 - Symptomatic but completely ambulatory  General: Well-developed, well-nourished, no acute distress. Eyes: Pink conjunctiva, anicteric sclera. HEENT: Normocephalic, moist mucous membranes. Lungs: Clear to auscultation bilaterally. Heart: Regular rate and rhythm. No rubs, murmurs, or gallops. Abdomen: Soft, nontender, nondistended. No organomegaly noted, normoactive bowel sounds. Musculoskeletal: No edema, cyanosis, or clubbing. Neuro: Alert, answering all questions appropriately. Cranial nerves grossly intact. Skin: No rashes or petechiae noted. Psych: Normal affect.  LAB RESULTS:  Lab Results  Component Value Date   NA 141 07/18/2018   K 4.2 07/18/2018   CL 110 07/18/2018    CO2 27 07/18/2018   GLUCOSE 97 07/18/2018   BUN 18 07/18/2018   CREATININE 0.98 07/18/2018   CALCIUM 8.4 (L) 07/18/2018   PROT 7.0 07/16/2018   ALBUMIN 3.8 07/16/2018   AST 22 07/16/2018   ALT 14 07/16/2018   ALKPHOS 65 07/16/2018   BILITOT 0.4 07/16/2018   GFRNONAA 57 (L) 07/18/2018   GFRAA >60 07/18/2018    Lab  Results  Component Value Date   WBC 3.7 08/29/2018   NEUTROABS 2.1 08/29/2018   HGB 8.4 (L) 08/29/2018   HCT 25.4 (L) 08/29/2018   MCV 87.1 08/29/2018   PLT 290 08/29/2018   Lab Results  Component Value Date   IRON 20 (L) 08/22/2018   TIBC 262 08/22/2018   IRONPCTSAT 8 (L) 08/22/2018    Lab Results  Component Value Date   FERRITIN 39 08/22/2018     STUDIES: Nm Gi Blood Loss  Result Date: 08/23/2018 CLINICAL DATA:  Melena, intermittent GI bleeding, decreased hemoglobin EXAM: NUCLEAR MEDICINE GASTROINTESTINAL BLEEDING SCAN TECHNIQUE: Sequential abdominal images were obtained following intravenous administration of Tc-2mlabeled red blood cells. RADIOPHARMACEUTICALS:  24.6 mCi [id number] in-vitro labeled red cells. COMPARISON:  07/17/2018 CT FINDINGS: Imaging of the abdomen and pelvis performed for 2 hours. Normal background vascular activity and excretion into the urinary tract. Negative for significant active or acute gastrointestinal bleeding. IMPRESSION: Negative for significant active GI bleed. Electronically Signed   By: M.Jerilynn Mages Shick M.D.   On: 08/23/2018 15:17    ASSESSMENT: Iron deficiency anemia, secondary to chronic blood loss.  PLAN:    1. Iron deficiency anemia, secondary to chronic blood loss: Patient's hemoglobin has mildly improved to 8.4, but she remains symptomatic and reports continued active bleed.  Return to clinic tomorrow for 1 additional unit of packed red blood cells. Nuclear medicine bleeding scan completed on August 23, 2018 did not reveal any significant GI bleed.  Bone marrow biopsy completed on February 15, 2017 did not  reveal any significant pathology.  Patient will require a repeat capsule endoscopy in the near future.  Return to clinic in 1 week for laboratory work and possible transfusion and then again 2 weeks for further evaluation. 2. Melena: Continue follow-up with GI as indicated.  Her most recent colonoscopy was on July 07, 2018.  She had an upper endoscopy also on July 07, 2018 and then a repeat endoscopy on July 17, 2018.  Repeat capsule endoscopy in the near future. 3. Chest pain/shortness of breath: Resolved.  Likely secondary to his significant anemia.  Continue follow-up with cardiology as indicated.  I spent a total of 30 minutes face-to-face with the patient of which greater than 50% of the visit was spent in counseling and coordination of care as detailed above.  Patient expressed understanding and was in agreement with this plan. She also understands that She can call clinic at any time with any questions, concerns, or complaints.    TiLloyd HugerMD 08/31/18 1:47 PM

## 2018-08-29 ENCOUNTER — Inpatient Hospital Stay (HOSPITAL_BASED_OUTPATIENT_CLINIC_OR_DEPARTMENT_OTHER): Payer: Medicare HMO | Admitting: Oncology

## 2018-08-29 ENCOUNTER — Encounter: Payer: Self-pay | Admitting: Oncology

## 2018-08-29 ENCOUNTER — Ambulatory Visit: Payer: Medicare HMO

## 2018-08-29 ENCOUNTER — Inpatient Hospital Stay: Payer: Medicare HMO | Attending: Oncology

## 2018-08-29 VITALS — BP 136/70 | HR 63 | Temp 98.0°F | Resp 18 | Wt 199.1 lb

## 2018-08-29 DIAGNOSIS — E042 Nontoxic multinodular goiter: Secondary | ICD-10-CM | POA: Diagnosis not present

## 2018-08-29 DIAGNOSIS — D500 Iron deficiency anemia secondary to blood loss (chronic): Secondary | ICD-10-CM

## 2018-08-29 DIAGNOSIS — K921 Melena: Secondary | ICD-10-CM

## 2018-08-29 DIAGNOSIS — Z87891 Personal history of nicotine dependence: Secondary | ICD-10-CM | POA: Diagnosis not present

## 2018-08-29 DIAGNOSIS — D509 Iron deficiency anemia, unspecified: Secondary | ICD-10-CM

## 2018-08-29 LAB — CBC WITH DIFFERENTIAL/PLATELET
BASOS ABS: 0.1 10*3/uL (ref 0–0.1)
Basophils Relative: 2 %
Eosinophils Absolute: 0.3 10*3/uL (ref 0–0.7)
Eosinophils Relative: 7 %
HEMATOCRIT: 25.4 % — AB (ref 35.0–47.0)
Hemoglobin: 8.4 g/dL — ABNORMAL LOW (ref 12.0–16.0)
LYMPHS PCT: 23 %
Lymphs Abs: 0.9 10*3/uL — ABNORMAL LOW (ref 1.0–3.6)
MCH: 28.7 pg (ref 26.0–34.0)
MCHC: 33 g/dL (ref 32.0–36.0)
MCV: 87.1 fL (ref 80.0–100.0)
Monocytes Absolute: 0.4 10*3/uL (ref 0.2–0.9)
Monocytes Relative: 11 %
NEUTROS ABS: 2.1 10*3/uL (ref 1.4–6.5)
Neutrophils Relative %: 57 %
Platelets: 290 10*3/uL (ref 150–440)
RBC: 2.91 MIL/uL — AB (ref 3.80–5.20)
RDW: 16.4 % — ABNORMAL HIGH (ref 11.5–14.5)
WBC: 3.7 10*3/uL (ref 3.6–11.0)

## 2018-08-29 LAB — PREPARE RBC (CROSSMATCH)

## 2018-08-29 LAB — SAMPLE TO BLOOD BANK

## 2018-08-29 NOTE — Progress Notes (Signed)
Pt in for follow up, states "feel a lot better than last time". Started on Imdur 30mg  qd last week.

## 2018-08-30 ENCOUNTER — Inpatient Hospital Stay: Payer: Medicare HMO

## 2018-08-30 DIAGNOSIS — D500 Iron deficiency anemia secondary to blood loss (chronic): Secondary | ICD-10-CM | POA: Diagnosis not present

## 2018-08-30 DIAGNOSIS — Z87891 Personal history of nicotine dependence: Secondary | ICD-10-CM | POA: Diagnosis not present

## 2018-08-30 DIAGNOSIS — K921 Melena: Secondary | ICD-10-CM | POA: Diagnosis not present

## 2018-08-30 MED ORDER — SODIUM CHLORIDE 0.9% IV SOLUTION
250.0000 mL | Freq: Once | INTRAVENOUS | Status: AC
  Administered 2018-08-30: 250 mL via INTRAVENOUS
  Filled 2018-08-30: qty 250

## 2018-08-30 MED ORDER — ACETAMINOPHEN 325 MG PO TABS
650.0000 mg | ORAL_TABLET | Freq: Once | ORAL | Status: AC
  Administered 2018-08-30: 650 mg via ORAL
  Filled 2018-08-30: qty 2

## 2018-08-30 MED ORDER — DIPHENHYDRAMINE HCL 50 MG/ML IJ SOLN
25.0000 mg | Freq: Once | INTRAMUSCULAR | Status: AC
  Administered 2018-08-30: 25 mg via INTRAVENOUS
  Filled 2018-08-30: qty 1

## 2018-08-31 LAB — TYPE AND SCREEN
ABO/RH(D): O POS
Antibody Screen: NEGATIVE
Unit division: 0

## 2018-08-31 LAB — BPAM RBC
BLOOD PRODUCT EXPIRATION DATE: 201909282359
ISSUE DATE / TIME: 201909040913
Unit Type and Rh: 5100

## 2018-09-04 DIAGNOSIS — E669 Obesity, unspecified: Secondary | ICD-10-CM | POA: Diagnosis not present

## 2018-09-04 DIAGNOSIS — E1142 Type 2 diabetes mellitus with diabetic polyneuropathy: Secondary | ICD-10-CM | POA: Diagnosis not present

## 2018-09-04 DIAGNOSIS — J449 Chronic obstructive pulmonary disease, unspecified: Secondary | ICD-10-CM | POA: Diagnosis not present

## 2018-09-04 DIAGNOSIS — I10 Essential (primary) hypertension: Secondary | ICD-10-CM | POA: Diagnosis not present

## 2018-09-04 DIAGNOSIS — I951 Orthostatic hypotension: Secondary | ICD-10-CM | POA: Diagnosis not present

## 2018-09-04 DIAGNOSIS — R69 Illness, unspecified: Secondary | ICD-10-CM | POA: Diagnosis not present

## 2018-09-04 DIAGNOSIS — Z794 Long term (current) use of insulin: Secondary | ICD-10-CM | POA: Diagnosis not present

## 2018-09-04 DIAGNOSIS — I25119 Atherosclerotic heart disease of native coronary artery with unspecified angina pectoris: Secondary | ICD-10-CM | POA: Diagnosis not present

## 2018-09-04 DIAGNOSIS — K219 Gastro-esophageal reflux disease without esophagitis: Secondary | ICD-10-CM | POA: Diagnosis not present

## 2018-09-04 DIAGNOSIS — K2970 Gastritis, unspecified, without bleeding: Secondary | ICD-10-CM | POA: Diagnosis not present

## 2018-09-06 ENCOUNTER — Inpatient Hospital Stay: Payer: Medicare HMO

## 2018-09-06 DIAGNOSIS — D500 Iron deficiency anemia secondary to blood loss (chronic): Secondary | ICD-10-CM | POA: Diagnosis not present

## 2018-09-06 DIAGNOSIS — D509 Iron deficiency anemia, unspecified: Secondary | ICD-10-CM

## 2018-09-06 DIAGNOSIS — K921 Melena: Secondary | ICD-10-CM | POA: Diagnosis not present

## 2018-09-06 DIAGNOSIS — Z87891 Personal history of nicotine dependence: Secondary | ICD-10-CM | POA: Diagnosis not present

## 2018-09-06 LAB — CBC WITH DIFFERENTIAL/PLATELET
BASOS ABS: 0 10*3/uL (ref 0–0.1)
BASOS PCT: 1 %
Eosinophils Absolute: 0.2 10*3/uL (ref 0–0.7)
Eosinophils Relative: 7 %
HEMATOCRIT: 26.9 % — AB (ref 35.0–47.0)
HEMOGLOBIN: 8.9 g/dL — AB (ref 12.0–16.0)
Lymphocytes Relative: 29 %
Lymphs Abs: 1 10*3/uL (ref 1.0–3.6)
MCH: 28.4 pg (ref 26.0–34.0)
MCHC: 33.1 g/dL (ref 32.0–36.0)
MCV: 85.9 fL (ref 80.0–100.0)
MONO ABS: 0.3 10*3/uL (ref 0.2–0.9)
Monocytes Relative: 9 %
NEUTROS ABS: 1.8 10*3/uL (ref 1.4–6.5)
NEUTROS PCT: 54 %
PLATELETS: 267 10*3/uL (ref 150–440)
RBC: 3.13 MIL/uL — ABNORMAL LOW (ref 3.80–5.20)
RDW: 15.9 % — AB (ref 11.5–14.5)
WBC: 3.4 10*3/uL — ABNORMAL LOW (ref 3.6–11.0)

## 2018-09-06 LAB — SAMPLE TO BLOOD BANK

## 2018-09-06 NOTE — Progress Notes (Signed)
No blood transfusion needed today, per Tillie Rung, Therapist, sports. Mrs. Kuennen Hgb is 8.9 today. Patient has no complaints today.

## 2018-09-09 NOTE — Progress Notes (Signed)
[place]  [phone]) [phone] [phone]) [phone]  ID: Joyce Potter OB: February 03, 1946  MR#: [id number]  LEX#:[id number]  Patient Care Team: [place] Pink, MD as PCP - General (Family Medicine) Manya Silvas, MD (Gastroenterology) Lloyd Huger, MD as Consulting Physician (Hematology and Oncology)  CHIEF COMPLAINT: Iron deficiency anemia, secondary to chronic blood loss.  INTERVAL HISTORY: Patient returns to clinic today for repeat laboratory work, further evaluation, and consideration of additional blood.  She continues to have chronic weakness and fatigue.  She also reports continued ongoing melena.  She has noted several episodes of diaphoresis over the past week.  She has no neurologic complaints.  She denies any recent fevers.  She denies any further chest pain, shortness of breath, cough, or hemoptysis.  She denies any nausea, vomiting, constipation, or diarrhea. She has no urinary complaints.  Patient offers no further specific complaints.  REVIEW OF SYSTEMS:   Review of Systems  Constitutional: Positive for diaphoresis and malaise/fatigue. Negative for fever and weight loss.  Eyes: Negative.  Negative for blurred vision.  Respiratory: Negative for cough, hemoptysis and shortness of breath.   Cardiovascular: Negative.  Negative for chest pain and leg swelling.  Gastrointestinal: Positive for melena. Negative for abdominal pain and blood in stool.  Genitourinary: Negative.  Negative for hematuria.  Musculoskeletal: Negative.  Negative for joint pain.  Skin: Negative.  Negative for rash.  Neurological: Positive for weakness. Negative for dizziness, focal weakness and headaches.  Psychiatric/Behavioral: Negative.  The patient is not nervous/anxious.     As per HPI. Otherwise, a complete review of systems is negative.  PAST MEDICAL HISTORY: Past Medical History:  Diagnosis Date  . Anemia    iron deficiency  . Aortic stenosis   . Basal cell  carcinoma   . CAD (coronary artery disease)    s/p Left circumflex stent in 2012  . Carotid stenosis   . Cataract   . High cholesterol   . HTN (hypertension)   . Hyperlipidemia   . IDDM (insulin dependent diabetes mellitus) ([place])   . Multiple thyroid nodules    Benign  . Peptic ulcer disease   . Retinal artery occlusion    on left    PAST SURGICAL HISTORY: Past Surgical History:  Procedure Laterality Date  . ARTERY BIOPSY Left 11/19/2015   Procedure: BIOPSY TEMPORAL ARTERY;  Surgeon: Algernon Huxley, MD;  Location: [facility] ORS;  Service: Vascular;  Laterality: Left;  . BREAST BIOPSY Left 2002   core- neg  . CARDIAC CATHETERIZATION N/A 08/08/2015   Procedure: Right and Left Heart Cath and Coronary Angiography;  Surgeon: Teodoro Spray, MD;  Location: [place] [facility] LAB;  Service: Cardiovascular;  Laterality: N/A;  . CARDIAC CATHETERIZATION Bilateral 09/03/2016   Procedure: Right/Left Heart Cath and Coronary Angiography;  Surgeon: Teodoro Spray, MD;  Location: [place] [facility];  Service: Cardiovascular;  Laterality: Bilateral;  . CATARACT EXTRACTION W/ INTRAOCULAR LENS  IMPLANT, BILATERAL    . COLONOSCOPY     in 2013- internal hemorrhoids  . COLONOSCOPY WITH PROPOFOL N/A 07/07/2018   Procedure: COLONOSCOPY WITH PROPOFOL;  Surgeon: Manya Silvas, MD;  Location: [facility] ENDOSCOPY;  Service: Endoscopy;  Laterality: N/A;  . CORONARY ANGIOGRAPHY N/A 09/14/2017   Procedure: CORONARY ANGIOGRAPHY;  Surgeon: Teodoro Spray, MD;  Location: [place] [facility] LAB;  Service: Cardiovascular;  Laterality: N/A;  . CORONARY ANGIOPLASTY    . CORONARY STENT PLACEMENT    . ENDARTERECTOMY Left 10/01/2015   Procedure: ENDARTERECTOMY  CAROTID;  Surgeon: Algernon Huxley, MD;  Location: [facility] ORS;  Service: Vascular;  Laterality: Left;  . ESOPHAGOGASTRODUODENOSCOPY    . ESOPHAGOGASTRODUODENOSCOPY (EGD) WITH PROPOFOL N/A 05/31/2016   Procedure: ESOPHAGOGASTRODUODENOSCOPY (EGD) WITH PROPOFOL;  Surgeon: Manya Silvas, MD;  Location: [facility] ENDOSCOPY;  Service: Endoscopy;  Laterality: N/A;  . ESOPHAGOGASTRODUODENOSCOPY (EGD) WITH PROPOFOL N/A 07/07/2018   Procedure: ESOPHAGOGASTRODUODENOSCOPY (EGD) WITH PROPOFOL;  Surgeon: Manya Silvas, MD;  Location: [facility] ENDOSCOPY;  Service: Endoscopy;  Laterality: N/A;  . ESOPHAGOGASTRODUODENOSCOPY (EGD) WITH PROPOFOL N/A 07/17/2018   Procedure: ESOPHAGOGASTRODUODENOSCOPY (EGD) WITH PROPOFOL;  Surgeon: Lucilla Lame, MD;  Location: [facility]COPY;  Service: Endoscopy;  Laterality: N/A;  . EYE SURGERY    . GIVENS CAPSULE STUDY N/A 04/17/2013   Procedure: GIVENS CAPSULE STUDY;  Surgeon: Arta Silence, MD;  Location: [facility] ENDOSCOPY;  Service: Endoscopy;  Laterality: N/A;  patient ate breakfast at 7am   . PARTIAL HYSTERECTOMY    . RIGHT AND LEFT HEART CATH Bilateral 09/14/2017   Procedure: RIGHT AND LEFT HEART CATH;  Surgeon: Teodoro Spray, MD;  Location: [place] CV LAB;  Service: Cardiovascular;  Laterality: Bilateral;  . TRIGGER FINGER RELEASE      FAMILY HISTORY Family History  Problem Relation Age of Onset  . Uterine cancer Mother   . Breast cancer Mother 50  . Seizures Father   . Stroke Father   . Diabetes Father   . COPD Father   . Colon cancer Neg Hx        ADVANCED DIRECTIVES:    HEALTH MAINTENANCE: Social History   Tobacco Use  . Smoking status: Former Smoker    Last attempt to quit: 02/15/1985    Years since quitting: 33.5  . Smokeless tobacco: Never Used  . Tobacco comment: quit about 31 years ago  Substance Use Topics  . Alcohol use: No    Alcohol/week: 0.0 standard drinks  . Drug use: No     Colonoscopy:  PAP:  Bone density:  Lipid panel:  Allergies  Allergen Reactions  . Aspirin Other (See Comments)    ulcers  . Invokamet [Canagliflozin-Metformin Hcl] Other (See Comments)    Yeast infection  . Sulfa Antibiotics Other (See Comments)    "Got Drunk"  . Lovastatin Rash  . Penicillins Rash    Has patient had a PCN  reaction causing immediate rash, facial/tongue/throat swelling, SOB or lightheadedness with hypotension:Yes Has patient had a PCN reaction causing severe rash involving mucus membranes or skin necrosis:all over body Has patient had a PCN reaction that required hospitalization: No Has patient had a PCN reaction occurring within the last 10 years: No If all of the above answers are "NO", then may proceed with Cephalosporin use.   . Vicodin [Hydrocodone-Acetaminophen] Rash    Current Outpatient Medications  Medication Sig Dispense Refill  . albuterol (PROAIR HFA) 108 (90 Base) MCG/ACT inhaler Inhale 2 puffs into the lungs every 4 (four) hours as needed. For shortness of breath/wheezing.    Marland Kitchen alendronate (FOSAMAX) 70 MG tablet Take 70 mg by mouth once a week. Take with a full glass of water on an empty stomach. (Tuesdays)    . amitriptyline (ELAVIL) 25 MG tablet Take 25 mg by mouth at bedtime.     . Ascorbic Acid (VITAMIN C) 1000 MG tablet Take 1,000 mg by mouth daily.    . Emollient (CERAVE) CREA Apply 1 application topically daily.    . insulin NPH-regular Human (NOVOLIN 70/30) (70-30) 100 UNIT/ML injection Inject 27-50  Units into the skin 2 (two) times daily. 50 units in the morning, & 27 units in the afternoon.    . isosorbide mononitrate (IMDUR) 30 MG 24 hr tablet Take by mouth.    . losartan (COZAAR) 50 MG tablet Take 1 tablet (50 mg total) by mouth daily with breakfast. 60 tablet 0  . metoprolol tartrate (LOPRESSOR) 50 MG tablet Take 2 tablets (100 mg total) by mouth 2 (two) times daily. 60 tablet 0  . omeprazole (PRILOSEC) 40 MG capsule Take 40 mg by mouth 2 (two) times daily.     Marland Kitchen OVER THE COUNTER MEDICATION Apply 1 application topically at bedtime. REVITALIFT    . pravastatin (PRAVACHOL) 10 MG tablet Take 1 tablet (10 mg total) by mouth daily at 6 PM. 30 tablet 0  . sucralfate (CARAFATE) 1 g tablet DISSOLVE 1 TABLET IN 15ML OF WATER TO CREATE A SLURRY. TAKE BY MOUTH THREE TIMES DAILY  BEFORE MEAL(S).  2  . tiotropium (SPIRIVA HANDIHALER) 18 MCG inhalation capsule Place 18 mcg into inhaler and inhale daily.     Marland Kitchen gabapentin (NEURONTIN) 300 MG capsule Take 300 mg by mouth at bedtime.      No current facility-administered medications for this visit.    Facility-Administered Medications Ordered in Other Visits  Medication Dose Route Frequency Provider Last Rate Last Dose  . heparin lock flush 100 unit/mL  500 Units Intravenous Once Verlon Au, NP      . sodium chloride flush (NS) 0.9 % injection 10 mL  10 mL Intravenous Once Verlon Au, NP        OBJECTIVE: Vitals:   09/12/18 1027  BP: (!) 150/76  Pulse: 84  Resp: 20  Temp: 98.4 F (36.9 C)     Body mass index is 35.62 kg/m.    ECOG FS:1 - Symptomatic but completely ambulatory  General: Well-developed, well-nourished, no acute distress. Eyes: Pink conjunctiva, anicteric sclera. HEENT: Normocephalic, moist mucous membranes. Lungs: Clear to auscultation bilaterally. Heart: Regular rate and rhythm. No rubs, murmurs, or gallops. Abdomen: Soft, nontender, nondistended. No organomegaly noted, normoactive bowel sounds. Musculoskeletal: No edema, cyanosis, or clubbing. Neuro: Alert, answering all questions appropriately. Cranial nerves grossly intact. Skin: No rashes or petechiae noted. Psych: Normal affect.  LAB RESULTS:  Lab Results  Component Value Date   NA 141 07/18/2018   K 4.2 07/18/2018   CL 110 07/18/2018   CO2 27 07/18/2018   GLUCOSE 97 07/18/2018   BUN 18 07/18/2018   CREATININE 0.98 07/18/2018   CALCIUM 8.4 (L) 07/18/2018   PROT 7.0 07/16/2018   ALBUMIN 3.8 07/16/2018   AST 22 07/16/2018   ALT 14 07/16/2018   ALKPHOS 65 07/16/2018   BILITOT 0.4 07/16/2018   GFRNONAA 57 (L) 07/18/2018   GFRAA >60 07/18/2018    Lab Results  Component Value Date   WBC 3.9 09/12/2018   NEUTROABS 2.2 09/12/2018   HGB 8.3 (L) 09/12/2018   HCT 25.8 (L) 09/12/2018   MCV 84.7 09/12/2018   PLT 306  09/12/2018   Lab Results  Component Value Date   IRON 20 (L) 08/22/2018   TIBC 262 08/22/2018   IRONPCTSAT 8 (L) 08/22/2018    Lab Results  Component Value Date   FERRITIN 39 08/22/2018     STUDIES: Nm Gi Blood Loss  Result Date: 08/23/2018 CLINICAL DATA:  Melena, intermittent GI bleeding, decreased hemoglobin EXAM: NUCLEAR MEDICINE GASTROINTESTINAL BLEEDING SCAN TECHNIQUE: Sequential abdominal images were obtained following intravenous administration of Tc-84mlabeled red  blood cells. RADIOPHARMACEUTICALS:  24.6 mCi Tc-13mpertechnetate in-vitro labeled red cells. COMPARISON:  07/17/2018 CT FINDINGS: Imaging of the abdomen and pelvis performed for 2 hours. Normal background vascular activity and excretion into the urinary tract. Negative for significant active or acute gastrointestinal bleeding. IMPRESSION: Negative for significant active GI bleed. Electronically Signed   By: MJerilynn Mages  Shick M.D.   On: 08/23/2018 15:17    ASSESSMENT: Iron deficiency anemia, secondary to chronic blood loss.  PLAN:    1. Iron deficiency anemia, secondary to chronic blood loss: Despite receiving 1 unit packed red blood cells 2 weeks ago, patient's hemoglobin is essentially unchanged.  She also reports continued active bleeding.  Return to clinic tomorrow for 1 additional unit of packed red blood cells. Nuclear medicine bleeding scan completed on August 23, 2018 did not reveal any significant GI bleed.  Bone marrow biopsy completed on February 15, 2017 did not reveal any significant pathology.  Patient will require a repeat capsule endoscopy in the near future and [facility]  Return to clinic in 1 week for laboratory work only and then in 2 weeks for laboratory work and further evaluation.  2. Melena: Patient [facility]  Her most recent colonoscopy was on July 07, 2018.  She had an upper endoscopy also on July 07, 2018 and then  a repeat endoscopy on July 17, 2018.  Repeat capsule endoscopy in the near future. 3. Chest pain/shortness of breath: Resolved.  Likely secondary to anemia.  Continue follow-up with cardiology as indicated. 4.  Iron overload: Patient has now had nearly 10 transfusions and is at risk for iron overload, therefore will give Desferal with the remainder of her infusions.  I spent a total of 30 minutes face-to-face with the patient of which greater than 50% of the visit was spent in counseling and coordination of care as detailed above.   Patient expressed understanding and was in agreement with this plan. She also understands that She can call clinic at any time with any questions, concerns, or complaints.    TLloyd Huger MD 09/12/18 1:20 PM

## 2018-09-12 ENCOUNTER — Encounter: Payer: Self-pay | Admitting: Oncology

## 2018-09-12 ENCOUNTER — Inpatient Hospital Stay (HOSPITAL_BASED_OUTPATIENT_CLINIC_OR_DEPARTMENT_OTHER): Payer: Medicare HMO | Admitting: Oncology

## 2018-09-12 ENCOUNTER — Other Ambulatory Visit: Payer: Self-pay | Admitting: *Deleted

## 2018-09-12 ENCOUNTER — Inpatient Hospital Stay: Payer: Medicare HMO

## 2018-09-12 VITALS — BP 150/76 | HR 84 | Temp 98.4°F | Resp 20 | Wt 201.1 lb

## 2018-09-12 DIAGNOSIS — D509 Iron deficiency anemia, unspecified: Secondary | ICD-10-CM

## 2018-09-12 DIAGNOSIS — Z87891 Personal history of nicotine dependence: Secondary | ICD-10-CM | POA: Diagnosis not present

## 2018-09-12 DIAGNOSIS — D500 Iron deficiency anemia secondary to blood loss (chronic): Secondary | ICD-10-CM

## 2018-09-12 DIAGNOSIS — K921 Melena: Secondary | ICD-10-CM | POA: Diagnosis not present

## 2018-09-12 LAB — CBC WITH DIFFERENTIAL/PLATELET
BASOS PCT: 1 %
Basophils Absolute: 0.1 10*3/uL (ref 0–0.1)
EOS PCT: 4 %
Eosinophils Absolute: 0.2 10*3/uL (ref 0–0.7)
HCT: 25.8 % — ABNORMAL LOW (ref 35.0–47.0)
Hemoglobin: 8.3 g/dL — ABNORMAL LOW (ref 12.0–16.0)
LYMPHS PCT: 28 %
Lymphs Abs: 1.1 10*3/uL (ref 1.0–3.6)
MCH: 27.3 pg (ref 26.0–34.0)
MCHC: 32.2 g/dL (ref 32.0–36.0)
MCV: 84.7 fL (ref 80.0–100.0)
MONO ABS: 0.4 10*3/uL (ref 0.2–0.9)
Monocytes Relative: 11 %
NEUTROS ABS: 2.2 10*3/uL (ref 1.4–6.5)
Neutrophils Relative %: 56 %
PLATELETS: 306 10*3/uL (ref 150–440)
RBC: 3.05 MIL/uL — AB (ref 3.80–5.20)
RDW: 16.4 % — AB (ref 11.5–14.5)
WBC: 3.9 10*3/uL (ref 3.6–11.0)

## 2018-09-12 LAB — PREPARE RBC (CROSSMATCH)

## 2018-09-12 LAB — SAMPLE TO BLOOD BANK

## 2018-09-12 NOTE — Progress Notes (Signed)
Patient reports she has been having episodes of sweating over the past 2 days.

## 2018-09-13 ENCOUNTER — Inpatient Hospital Stay: Payer: Medicare HMO

## 2018-09-13 DIAGNOSIS — Z87891 Personal history of nicotine dependence: Secondary | ICD-10-CM | POA: Diagnosis not present

## 2018-09-13 DIAGNOSIS — K921 Melena: Secondary | ICD-10-CM | POA: Diagnosis not present

## 2018-09-13 DIAGNOSIS — D500 Iron deficiency anemia secondary to blood loss (chronic): Secondary | ICD-10-CM | POA: Diagnosis not present

## 2018-09-13 DIAGNOSIS — E042 Nontoxic multinodular goiter: Secondary | ICD-10-CM | POA: Diagnosis not present

## 2018-09-13 MED ORDER — ACETAMINOPHEN 325 MG PO TABS
650.0000 mg | ORAL_TABLET | Freq: Once | ORAL | Status: AC
  Administered 2018-09-13: 650 mg via ORAL
  Filled 2018-09-13: qty 2

## 2018-09-13 MED ORDER — DIPHENHYDRAMINE HCL 50 MG/ML IJ SOLN
25.0000 mg | Freq: Once | INTRAMUSCULAR | Status: AC
  Administered 2018-09-13: 25 mg via INTRAVENOUS
  Filled 2018-09-13: qty 1

## 2018-09-13 MED ORDER — SODIUM CHLORIDE 0.9 % IV SOLN
15.0000 mg/kg/h | Freq: Once | INTRAVENOUS | Status: AC
  Administered 2018-09-13: 15 mg/kg/h via INTRAVENOUS
  Filled 2018-09-13: qty 2

## 2018-09-13 MED ORDER — SODIUM CHLORIDE 0.9% IV SOLUTION
250.0000 mL | Freq: Once | INTRAVENOUS | Status: AC
  Administered 2018-09-13: 250 mL via INTRAVENOUS
  Filled 2018-09-13: qty 250

## 2018-09-14 DIAGNOSIS — E042 Nontoxic multinodular goiter: Secondary | ICD-10-CM | POA: Diagnosis not present

## 2018-09-14 LAB — BPAM RBC
BLOOD PRODUCT EXPIRATION DATE: 201910082359
ISSUE DATE / TIME: 201909181137
UNIT TYPE AND RH: 5100

## 2018-09-14 LAB — TYPE AND SCREEN
ABO/RH(D): O POS
Antibody Screen: NEGATIVE
UNIT DIVISION: 0

## 2018-09-16 ENCOUNTER — Encounter: Payer: Self-pay | Admitting: Oncology

## 2018-09-19 ENCOUNTER — Inpatient Hospital Stay: Payer: Medicare HMO

## 2018-09-19 DIAGNOSIS — D500 Iron deficiency anemia secondary to blood loss (chronic): Secondary | ICD-10-CM | POA: Diagnosis not present

## 2018-09-19 DIAGNOSIS — Z87891 Personal history of nicotine dependence: Secondary | ICD-10-CM | POA: Diagnosis not present

## 2018-09-19 DIAGNOSIS — K921 Melena: Secondary | ICD-10-CM | POA: Diagnosis not present

## 2018-09-19 DIAGNOSIS — D509 Iron deficiency anemia, unspecified: Secondary | ICD-10-CM

## 2018-09-19 LAB — CBC WITH DIFFERENTIAL/PLATELET
BASOS ABS: 0.1 10*3/uL (ref 0–0.1)
BASOS PCT: 1 %
Eosinophils Absolute: 0.3 10*3/uL (ref 0–0.7)
Eosinophils Relative: 6 %
HEMATOCRIT: 28.1 % — AB (ref 35.0–47.0)
HEMOGLOBIN: 9.1 g/dL — AB (ref 12.0–16.0)
LYMPHS PCT: 22 %
Lymphs Abs: 1 10*3/uL (ref 1.0–3.6)
MCH: 27 pg (ref 26.0–34.0)
MCHC: 32.4 g/dL (ref 32.0–36.0)
MCV: 83.4 fL (ref 80.0–100.0)
Monocytes Absolute: 0.5 10*3/uL (ref 0.2–0.9)
Monocytes Relative: 11 %
NEUTROS ABS: 2.8 10*3/uL (ref 1.4–6.5)
NEUTROS PCT: 60 %
Platelets: 288 10*3/uL (ref 150–440)
RBC: 3.37 MIL/uL — AB (ref 3.80–5.20)
RDW: 16.3 % — ABNORMAL HIGH (ref 11.5–14.5)
WBC: 4.6 10*3/uL (ref 3.6–11.0)

## 2018-09-20 LAB — SAMPLE TO BLOOD BANK

## 2018-09-21 DIAGNOSIS — N6452 Nipple discharge: Secondary | ICD-10-CM | POA: Diagnosis not present

## 2018-09-21 DIAGNOSIS — R69 Illness, unspecified: Secondary | ICD-10-CM | POA: Diagnosis not present

## 2018-09-21 DIAGNOSIS — K922 Gastrointestinal hemorrhage, unspecified: Secondary | ICD-10-CM | POA: Diagnosis not present

## 2018-09-25 NOTE — Progress Notes (Signed)
[place]  Telephone:(336) [phone] Fax:(336) [phone]  ID: VINCENZA DAIL OB: 23-Nov-1946  MR#: [id number]  OIT#:[id number]  Patient Care Team: Maryland Pink, MD as PCP - General (Family Medicine) Manya Silvas, MD (Gastroenterology) Lloyd Huger, MD as Consulting Physician (Hematology and Oncology)  CHIEF COMPLAINT: Iron deficiency anemia, secondary to chronic blood loss.  INTERVAL HISTORY: Patient returns to clinic today for repeat laboratory work and further evaluation.  Her weakness and fatigue have improved, but are still evident.  She reports continued ongoing melena.  She otherwise feels well. She has no neurologic complaints.  She denies any recent fevers.  She denies any further chest pain, shortness of breath, cough, or hemoptysis.  She denies any nausea, vomiting, constipation, or diarrhea. She has no urinary complaints.  Patient offers no further specific complaints today.  REVIEW OF SYSTEMS:   Review of Systems  Constitutional: Positive for malaise/fatigue. Negative for diaphoresis, fever and weight loss.  Eyes: Negative.  Negative for blurred vision.  Respiratory: Negative for cough, hemoptysis and shortness of breath.   Cardiovascular: Negative.  Negative for chest pain and leg swelling.  Gastrointestinal: Positive for melena. Negative for abdominal pain and blood in stool.  Genitourinary: Negative.  Negative for hematuria.  Musculoskeletal: Negative.  Negative for joint pain.  Skin: Negative.  Negative for rash.  Neurological: Positive for weakness. Negative for dizziness, focal weakness and headaches.  Psychiatric/Behavioral: Negative.  The patient is not nervous/anxious.     As per HPI. Otherwise, a complete review of systems is negative.  PAST MEDICAL HISTORY: Past Medical History:  Diagnosis Date  . Anemia    iron deficiency  . Aortic stenosis   . Basal cell carcinoma   . CAD (coronary artery disease)    s/p Left circumflex  stent in 2012  . Carotid stenosis   . Cataract   . High cholesterol   . HTN (hypertension)   . Hyperlipidemia   . IDDM (insulin dependent diabetes mellitus) ([place])   . Multiple thyroid nodules    Benign  . Peptic ulcer disease   . Retinal artery occlusion    on left    PAST SURGICAL HISTORY: Past Surgical History:  Procedure Laterality Date  . ARTERY BIOPSY Left 11/19/2015   Procedure: BIOPSY TEMPORAL ARTERY;  Surgeon: Algernon Huxley, MD;  Location: [facility] ORS;  Service: Vascular;  Laterality: Left;  . BREAST BIOPSY Left 2002   core- neg  . CARDIAC CATHETERIZATION N/A 08/08/2015   Procedure: Right and Left Heart Cath and Coronary Angiography;  Surgeon: Teodoro Spray, MD;  Location: [place] [facility] LAB;  Service: Cardiovascular;  Laterality: N/A;  . CARDIAC CATHETERIZATION Bilateral 09/03/2016   Procedure: Right/Left Heart Cath and Coronary Angiography;  Surgeon: Teodoro Spray, MD;  Location: [place] [facility] LAB;  Service: Cardiovascular;  Laterality: Bilateral;  . CATARACT EXTRACTION W/ INTRAOCULAR LENS  IMPLANT, BILATERAL    . COLONOSCOPY     in 2013- internal hemorrhoids  . COLONOSCOPY WITH PROPOFOL N/A 07/07/2018   Procedure: COLONOSCOPY WITH PROPOFOL;  Surgeon: Manya Silvas, MD;  Location: [facility] ENDOSCOPY;  Service: Endoscopy;  Laterality: N/A;  . CORONARY ANGIOGRAPHY N/A 09/14/2017   Procedure: CORONARY ANGIOGRAPHY;  Surgeon: Teodoro Spray, MD;  Location: [place] CV LAB;  Service: Cardiovascular;  Laterality: N/A;  . CORONARY ANGIOPLASTY    . CORONARY STENT PLACEMENT    . ENDARTERECTOMY Left 10/01/2015   Procedure: ENDARTERECTOMY CAROTID;  Surgeon: Algernon Huxley, MD;  Location: [facility] ORS;  Service: Vascular;  Laterality: Left;  . ESOPHAGOGASTRODUODENOSCOPY    . ESOPHAGOGASTRODUODENOSCOPY (EGD) WITH PROPOFOL N/A 05/31/2016   Procedure: ESOPHAGOGASTRODUODENOSCOPY (EGD) WITH PROPOFOL;  Surgeon: Manya Silvas, MD;  Location: [facility] ENDOSCOPY;  Service: Endoscopy;  Laterality:  N/A;  . ESOPHAGOGASTRODUODENOSCOPY (EGD) WITH PROPOFOL N/A 07/07/2018   Procedure: ESOPHAGOGASTRODUODENOSCOPY (EGD) WITH PROPOFOL;  Surgeon: Manya Silvas, MD;  Location: [facility] ENDOSCOPY;  Service: Endoscopy;  Laterality: N/A;  . ESOPHAGOGASTRODUODENOSCOPY (EGD) WITH PROPOFOL N/A 07/17/2018   Procedure: ESOPHAGOGASTRODUODENOSCOPY (EGD) WITH PROPOFOL;  Surgeon: Lucilla Lame, MD;  Location: [facility] ENDOSCOPY;  Service: Endoscopy;  Laterality: N/A;  . EYE SURGERY    . GIVENS CAPSULE STUDY N/A 04/17/2013   Procedure: GIVENS CAPSULE STUDY;  Surgeon: Arta Silence, MD;  Location: [facility] ENDOSCOPY;  Service: Endoscopy;  Laterality: N/A;  patient ate breakfast at 7am   . PARTIAL HYSTERECTOMY    . RIGHT AND LEFT HEART CATH Bilateral 09/14/2017   Procedure: RIGHT AND LEFT HEART CATH;  Surgeon: Teodoro Spray, MD;  Location: [place] CV LAB;  Service: Cardiovascular;  Laterality: Bilateral;  . TRIGGER FINGER RELEASE      FAMILY HISTORY Family History  Problem Relation Age of Onset  . Uterine cancer Mother   . Breast cancer Mother 55  . Seizures Father   . Stroke Father   . Diabetes Father   . COPD Father   . Colon cancer Neg Hx        ADVANCED DIRECTIVES:    HEALTH MAINTENANCE: Social History   Tobacco Use  . Smoking status: Former Smoker    Last attempt to quit: 02/15/1985    Years since quitting: 33.6  . Smokeless tobacco: Never Used  . Tobacco comment: quit about 31 years ago  Substance Use Topics  . Alcohol use: No    Alcohol/week: 0.0 standard drinks  . Drug use: No     Colonoscopy:  PAP:  Bone density:  Lipid panel:  Allergies  Allergen Reactions  . Aspirin Other (See Comments)    ulcers  . Invokamet [Canagliflozin-Metformin Hcl] Other (See Comments)    Yeast infection  . Sulfa Antibiotics Other (See Comments)    "Got Drunk"  . Lovastatin Rash  . Penicillins Rash    Has patient had a PCN reaction causing immediate rash, facial/tongue/throat swelling, SOB or  lightheadedness with hypotension:Yes Has patient had a PCN reaction causing severe rash involving mucus membranes or skin necrosis:all over body Has patient had a PCN reaction that required hospitalization: No Has patient had a PCN reaction occurring within the last 10 years: No If all of the above answers are "NO", then may proceed with Cephalosporin use.   . Vicodin [Hydrocodone-Acetaminophen] Rash    Current Outpatient Medications  Medication Sig Dispense Refill  . albuterol (PROAIR HFA) 108 (90 Base) MCG/ACT inhaler Inhale 2 puffs into the lungs every 4 (four) hours as needed. For shortness of breath/wheezing.    Marland Kitchen alendronate (FOSAMAX) 70 MG tablet Take 70 mg by mouth once a week. Take with a full glass of water on an empty stomach. (Tuesdays)    . amitriptyline (ELAVIL) 25 MG tablet Take 25 mg by mouth at bedtime.     . Ascorbic Acid (VITAMIN C) 1000 MG tablet Take 1,000 mg by mouth daily.    . Emollient (CERAVE) CREA Apply 1 application topically daily.    . insulin NPH-regular Human (NOVOLIN 70/30) (70-30) 100 UNIT/ML injection Inject 27-50 Units into the skin 2 (two) times daily. 50 units in the  morning, & 27 units in the afternoon.    . isosorbide mononitrate (IMDUR) 30 MG 24 hr tablet Take by mouth.    . losartan (COZAAR) 50 MG tablet Take 1 tablet (50 mg total) by mouth daily with breakfast. 60 tablet 0  . metoprolol tartrate (LOPRESSOR) 50 MG tablet Take 2 tablets (100 mg total) by mouth 2 (two) times daily. 60 tablet 0  . omeprazole (PRILOSEC) 40 MG capsule Take 40 mg by mouth 2 (two) times daily.     Marland Kitchen OVER THE COUNTER MEDICATION Apply 1 application topically at bedtime. REVITALIFT    . pravastatin (PRAVACHOL) 10 MG tablet Take 1 tablet (10 mg total) by mouth daily at 6 PM. 30 tablet 0  . sucralfate (CARAFATE) 1 g tablet DISSOLVE 1 TABLET IN 15ML OF WATER TO CREATE A SLURRY. TAKE BY MOUTH THREE TIMES DAILY BEFORE MEAL(S).  2  . gabapentin (NEURONTIN) 300 MG capsule Take 300 mg  by mouth at bedtime.     Marland Kitchen tiotropium (SPIRIVA HANDIHALER) 18 MCG inhalation capsule Place 18 mcg into inhaler and inhale daily.      No current facility-administered medications for this visit.    Facility-Administered Medications Ordered in Other Visits  Medication Dose Route Frequency Provider Last Rate Last Dose  . heparin lock flush 100 unit/mL  500 Units Intravenous Once Verlon Au, NP      . sodium chloride flush (NS) 0.9 % injection 10 mL  10 mL Intravenous Once Verlon Au, NP        OBJECTIVE: Vitals:   09/27/18 0854  BP: (!) 170/76  Pulse: 71  Resp: 18  Temp: (!) 97 F (36.1 C)     Body mass index is 35.13 kg/m.    ECOG FS:1 - Symptomatic but completely ambulatory  General: Well-developed, well-nourished, no acute distress. Eyes: Pink conjunctiva, anicteric sclera. HEENT: Normocephalic, moist mucous membranes. Lungs: Clear to auscultation bilaterally. Heart: Regular rate and rhythm. No rubs, murmurs, or gallops. Abdomen: Soft, nontender, nondistended. No organomegaly noted, normoactive bowel sounds. Musculoskeletal: No edema, cyanosis, or clubbing. Neuro: Alert, answering all questions appropriately. Cranial nerves grossly intact. Skin: No rashes or petechiae noted. Psych: Normal affect.  LAB RESULTS:  Lab Results  Component Value Date   NA 141 07/18/2018   K 4.2 07/18/2018   CL 110 07/18/2018   CO2 27 07/18/2018   GLUCOSE 97 07/18/2018   BUN 18 07/18/2018   CREATININE 0.98 07/18/2018   CALCIUM 8.4 (L) 07/18/2018   PROT 7.0 07/16/2018   ALBUMIN 3.8 07/16/2018   AST 22 07/16/2018   ALT 14 07/16/2018   ALKPHOS 65 07/16/2018   BILITOT 0.4 07/16/2018   GFRNONAA 57 (L) 07/18/2018   GFRAA >60 07/18/2018    Lab Results  Component Value Date   WBC 4.3 09/27/2018   NEUTROABS 2.4 09/27/2018   HGB 9.0 (L) 09/27/2018   HCT 27.3 (L) 09/27/2018   MCV 81.0 09/27/2018   PLT 293 09/27/2018   Lab Results  Component Value Date   IRON 20 (L)  08/22/2018   TIBC 262 08/22/2018   IRONPCTSAT 8 (L) 08/22/2018    Lab Results  Component Value Date   FERRITIN 39 08/22/2018     STUDIES: No results found.  ASSESSMENT: Iron deficiency anemia, secondary to chronic blood loss.  PLAN:    1. Iron deficiency anemia, secondary to chronic blood loss: Patient's hemoglobin remains decreased, but significantly improved.  She also reports continued active bleeding. Nuclear medicine bleeding scan completed on  August 23, 2018 did not reveal any significant GI bleed.  Bone marrow biopsy completed on February 15, 2017 did not reveal any significant pathology.  Patient will require a repeat capsule endoscopy in the near future and has an appointment with [facility] in the near future.  She does not require transfusion today, but will proceed with 510 mg IV Feraheme.  Return to clinic in 2 weeks for repeat laboratory work and further evaluation.   2. Melena: Patient has an appointment with [facility] in the near future.  Her most recent colonoscopy was on July 07, 2018.  She had an upper endoscopy also on July 07, 2018 and then a repeat endoscopy on July 17, 2018.  Repeat capsule endoscopy in the near future. 3. Chest pain/shortness of breath: Resolved.  Likely heart strain secondary to severe anemia.  Continue follow-up with cardiology as indicated.  I spent a total of 30 minutes face-to-face with the patient of which greater than 50% of the visit was spent in counseling and coordination of care as detailed above.   Patient expressed understanding and was in agreement with this plan. She also understands that She can call clinic at any time with any questions, concerns, or complaints.    Lloyd Huger, MD 09/30/18 6:56 AM

## 2018-09-26 ENCOUNTER — Other Ambulatory Visit: Payer: Medicare HMO

## 2018-09-26 ENCOUNTER — Ambulatory Visit: Payer: Medicare HMO | Admitting: Oncology

## 2018-09-27 ENCOUNTER — Encounter: Payer: Self-pay | Admitting: Oncology

## 2018-09-27 ENCOUNTER — Ambulatory Visit: Payer: Medicare HMO

## 2018-09-27 ENCOUNTER — Inpatient Hospital Stay: Payer: Medicare HMO | Attending: Oncology

## 2018-09-27 ENCOUNTER — Inpatient Hospital Stay (HOSPITAL_BASED_OUTPATIENT_CLINIC_OR_DEPARTMENT_OTHER): Payer: Medicare HMO | Admitting: Oncology

## 2018-09-27 ENCOUNTER — Inpatient Hospital Stay: Payer: Medicare HMO

## 2018-09-27 VITALS — BP 170/76 | HR 71 | Temp 97.0°F | Resp 18 | Wt 198.3 lb

## 2018-09-27 DIAGNOSIS — D500 Iron deficiency anemia secondary to blood loss (chronic): Secondary | ICD-10-CM | POA: Insufficient documentation

## 2018-09-27 DIAGNOSIS — R5383 Other fatigue: Secondary | ICD-10-CM

## 2018-09-27 DIAGNOSIS — R531 Weakness: Secondary | ICD-10-CM

## 2018-09-27 DIAGNOSIS — K921 Melena: Secondary | ICD-10-CM | POA: Diagnosis not present

## 2018-09-27 DIAGNOSIS — D509 Iron deficiency anemia, unspecified: Secondary | ICD-10-CM

## 2018-09-27 DIAGNOSIS — Z87891 Personal history of nicotine dependence: Secondary | ICD-10-CM | POA: Diagnosis not present

## 2018-09-27 LAB — SAMPLE TO BLOOD BANK

## 2018-09-27 LAB — CBC WITH DIFFERENTIAL/PLATELET
BASOS ABS: 0.1 10*3/uL (ref 0–0.1)
BASOS PCT: 1 %
Eosinophils Absolute: 0.3 10*3/uL (ref 0–0.7)
Eosinophils Relative: 6 %
HCT: 27.3 % — ABNORMAL LOW (ref 35.0–47.0)
HEMOGLOBIN: 9 g/dL — AB (ref 12.0–16.0)
LYMPHS PCT: 27 %
Lymphs Abs: 1.2 10*3/uL (ref 1.0–3.6)
MCH: 26.7 pg (ref 26.0–34.0)
MCHC: 32.9 g/dL (ref 32.0–36.0)
MCV: 81 fL (ref 80.0–100.0)
Monocytes Absolute: 0.4 10*3/uL (ref 0.2–0.9)
Monocytes Relative: 9 %
NEUTROS PCT: 57 %
Neutro Abs: 2.4 10*3/uL (ref 1.4–6.5)
Platelets: 293 10*3/uL (ref 150–440)
RBC: 3.37 MIL/uL — AB (ref 3.80–5.20)
RDW: 15.9 % — AB (ref 11.5–14.5)
WBC: 4.3 10*3/uL (ref 3.6–11.0)

## 2018-09-27 MED ORDER — SODIUM CHLORIDE 0.9 % IV SOLN
INTRAVENOUS | Status: DC
  Administered 2018-09-27: 10:00:00 via INTRAVENOUS
  Filled 2018-09-27: qty 250

## 2018-09-27 MED ORDER — SODIUM CHLORIDE 0.9 % IV SOLN
510.0000 mg | Freq: Once | INTRAVENOUS | Status: AC
  Administered 2018-09-27: 510 mg via INTRAVENOUS
  Filled 2018-09-27: qty 17

## 2018-09-27 NOTE — Progress Notes (Signed)
Patient here today for follow up regarding anemia. Patient reports fatigue and dark stools.

## 2018-09-29 DIAGNOSIS — R69 Illness, unspecified: Secondary | ICD-10-CM | POA: Diagnosis not present

## 2018-10-02 DIAGNOSIS — H43813 Vitreous degeneration, bilateral: Secondary | ICD-10-CM | POA: Diagnosis not present

## 2018-10-02 DIAGNOSIS — M3501 Sicca syndrome with keratoconjunctivitis: Secondary | ICD-10-CM | POA: Diagnosis not present

## 2018-10-08 NOTE — Progress Notes (Deleted)
[place]  [phone] [phone]  ID: CERITA RABELO OB: 19-Jan-1946  MR#: [id number]  LXB#:[id number]  Patient Care Team: [place] Pink, MD as PCP - General (Family Medicine) Manya Silvas, MD (Gastroenterology) Lloyd Huger, MD as Consulting Physician (Hematology and Oncology)  CHIEF COMPLAINT: Iron deficiency anemia, secondary to chronic blood loss.  INTERVAL HISTORY: Patient returns to clinic today for repeat laboratory work and further evaluation.  Her weakness and fatigue have improved, but are still evident.  She reports continued ongoing melena.  She otherwise feels well. She has no neurologic complaints.  She denies any recent fevers.  She denies any further chest pain, shortness of breath, cough, or hemoptysis.  She denies any nausea, vomiting, constipation, or diarrhea. She has no urinary complaints.  Patient offers no further specific complaints today.  REVIEW OF SYSTEMS:   Review of Systems  Constitutional: Positive for malaise/fatigue. Negative for diaphoresis, fever and weight loss.  Eyes: Negative.  Negative for blurred vision.  Respiratory: Negative for cough, hemoptysis and shortness of breath.   Cardiovascular: Negative.  Negative for chest pain and leg swelling.  Gastrointestinal: Positive for melena. Negative for abdominal pain and blood in stool.  Genitourinary: Negative.  Negative for hematuria.  Musculoskeletal: Negative.  Negative for joint pain.  Skin: Negative.  Negative for rash.  Neurological: Positive for weakness. Negative for dizziness, focal weakness and headaches.  Psychiatric/Behavioral: Negative.  The patient is not nervous/anxious.     As per HPI. Otherwise, a complete review of systems is negative.  PAST MEDICAL HISTORY: Past Medical History:  Diagnosis Date  . Anemia    iron deficiency  . Aortic stenosis   . Basal cell carcinoma   . CAD (coronary artery disease)    s/p Left circumflex  stent in 2012  . Carotid stenosis   . Cataract   . High cholesterol   . HTN (hypertension)   . Hyperlipidemia   . IDDM (insulin dependent diabetes mellitus) ([place])   . Multiple thyroid nodules    Benign  . Peptic ulcer disease   . Retinal artery occlusion    on left    PAST SURGICAL HISTORY: Past Surgical History:  Procedure Laterality Date  . ARTERY BIOPSY Left 11/19/2015   Procedure: BIOPSY TEMPORAL ARTERY;  Surgeon: Algernon Huxley, MD;  Location: [facility] ORS;  Service: Vascular;  Laterality: Left;  . BREAST BIOPSY Left 2002   core- neg  . CARDIAC CATHETERIZATION N/A 08/08/2015   Procedure: Right and Left Heart Cath and Coronary Angiography;  Surgeon: Teodoro Spray, MD;  Location: [place] [facility] LAB;  Service: Cardiovascular;  Laterality: N/A;  . CARDIAC CATHETERIZATION Bilateral 09/03/2016   Procedure: Right/Left Heart Cath and Coronary Angiography;  Surgeon: Teodoro Spray, MD;  Location: [place] [facility];  Service: Cardiovascular;  Laterality: Bilateral;  . CATARACT EXTRACTION W/ INTRAOCULAR LENS  IMPLANT, BILATERAL    . COLONOSCOPY     in 2013- internal hemorrhoids  . COLONOSCOPY WITH PROPOFOL N/A 07/07/2018   Procedure: COLONOSCOPY WITH PROPOFOL;  Surgeon: Manya Silvas, MD;  Location: [facility] ENDOSCOPY;  Service: Endoscopy;  Laterality: N/A;  . CORONARY ANGIOGRAPHY N/A 09/14/2017   Procedure: CORONARY ANGIOGRAPHY;  Surgeon: Teodoro Spray, MD;  Location: [place] [facility] LAB;  Service: Cardiovascular;  Laterality: N/A;  . CORONARY ANGIOPLASTY    . CORONARY STENT PLACEMENT    . ENDARTERECTOMY Left 10/01/2015   Procedure: ENDARTERECTOMY CAROTID;  Surgeon: Algernon Huxley, MD;  Location: [facility] ORS;  Service: Vascular;  Laterality: Left;  . ESOPHAGOGASTRODUODENOSCOPY    . ESOPHAGOGASTRODUODENOSCOPY (EGD) WITH PROPOFOL N/A 05/31/2016   Procedure: ESOPHAGOGASTRODUODENOSCOPY (EGD) WITH PROPOFOL;  Surgeon: Manya Silvas, MD;  Location: [facility] ENDOSCOPY;  Service: Endoscopy;  Laterality:  N/A;  . ESOPHAGOGASTRODUODENOSCOPY (EGD) WITH PROPOFOL N/A 07/07/2018   Procedure: ESOPHAGOGASTRODUODENOSCOPY (EGD) WITH PROPOFOL;  Surgeon: Manya Silvas, MD;  Location: [facility] ENDOSCOPY;  Service: Endoscopy;  Laterality: N/A;  . ESOPHAGOGASTRODUODENOSCOPY (EGD) WITH PROPOFOL N/A 07/17/2018   Procedure: ESOPHAGOGASTRODUODENOSCOPY (EGD) WITH PROPOFOL;  Surgeon: Lucilla Lame, MD;  Location: [facility] ENDOSCOPY;  Service: Endoscopy;  Laterality: N/A;  . EYE SURGERY    . GIVENS CAPSULE STUDY N/A 04/17/2013   Procedure: GIVENS CAPSULE STUDY;  Surgeon: Arta Silence, MD;  Location: [facility] ENDOSCOPY;  Service: Endoscopy;  Laterality: N/A;  patient ate breakfast at 7am   . PARTIAL HYSTERECTOMY    . RIGHT AND LEFT HEART CATH Bilateral 09/14/2017   Procedure: RIGHT AND LEFT HEART CATH;  Surgeon: Teodoro Spray, MD;  Location: [place] CV LAB;  Service: Cardiovascular;  Laterality: Bilateral;  . TRIGGER FINGER RELEASE      FAMILY HISTORY Family History  Problem Relation Age of Onset  . Uterine cancer Mother   . Breast cancer Mother 75  . Seizures Father   . Stroke Father   . Diabetes Father   . COPD Father   . Colon cancer Neg Hx        ADVANCED DIRECTIVES:    HEALTH MAINTENANCE: Social History   Tobacco Use  . Smoking status: Former Smoker    Last attempt to quit: 02/15/1985    Years since quitting: 33.6  . Smokeless tobacco: Never Used  . Tobacco comment: quit about 31 years ago  Substance Use Topics  . Alcohol use: No    Alcohol/week: 0.0 standard drinks  . Drug use: No     Colonoscopy:  PAP:  Bone density:  Lipid panel:  Allergies  Allergen Reactions  . Aspirin Other (See Comments)    ulcers  . Invokamet [Canagliflozin-Metformin Hcl] Other (See Comments)    Yeast infection  . Sulfa Antibiotics Other (See Comments)    "Got Drunk"  . Lovastatin Rash  . Penicillins Rash    Has patient had a PCN reaction causing immediate rash, facial/tongue/throat swelling, SOB or  lightheadedness with hypotension:Yes Has patient had a PCN reaction causing severe rash involving mucus membranes or skin necrosis:all over body Has patient had a PCN reaction that required hospitalization: No Has patient had a PCN reaction occurring within the last 10 years: No If all of the above answers are "NO", then may proceed with Cephalosporin use.   . Vicodin [Hydrocodone-Acetaminophen] Rash    Current Outpatient Medications  Medication Sig Dispense Refill  . albuterol (PROAIR HFA) 108 (90 Base) MCG/ACT inhaler Inhale 2 puffs into the lungs every 4 (four) hours as needed. For shortness of breath/wheezing.    Marland Kitchen alendronate (FOSAMAX) 70 MG tablet Take 70 mg by mouth once a week. Take with a full glass of water on an empty stomach. (Tuesdays)    . amitriptyline (ELAVIL) 25 MG tablet Take 25 mg by mouth at bedtime.     . Ascorbic Acid (VITAMIN C) 1000 MG tablet Take 1,000 mg by mouth daily.    . Emollient (CERAVE) CREA Apply 1 application topically daily.    Marland Kitchen gabapentin (NEURONTIN) 300 MG capsule Take 300 mg by mouth at bedtime.     . insulin NPH-regular Human (NOVOLIN 70/30) (70-30)  100 UNIT/ML injection Inject 27-50 Units into the skin 2 (two) times daily. 50 units in the morning, & 27 units in the afternoon.    . isosorbide mononitrate (IMDUR) 30 MG 24 hr tablet Take by mouth.    . losartan (COZAAR) 50 MG tablet Take 1 tablet (50 mg total) by mouth daily with breakfast. 60 tablet 0  . metoprolol tartrate (LOPRESSOR) 50 MG tablet Take 2 tablets (100 mg total) by mouth 2 (two) times daily. 60 tablet 0  . omeprazole (PRILOSEC) 40 MG capsule Take 40 mg by mouth 2 (two) times daily.     Marland Kitchen OVER THE COUNTER MEDICATION Apply 1 application topically at bedtime. REVITALIFT    . pravastatin (PRAVACHOL) 10 MG tablet Take 1 tablet (10 mg total) by mouth daily at 6 PM. 30 tablet 0  . sucralfate (CARAFATE) 1 g tablet DISSOLVE 1 TABLET IN 15ML OF WATER TO CREATE A SLURRY. TAKE BY MOUTH THREE TIMES  DAILY BEFORE MEAL(S).  2  . tiotropium (SPIRIVA HANDIHALER) 18 MCG inhalation capsule Place 18 mcg into inhaler and inhale daily.      No current facility-administered medications for this visit.    Facility-Administered Medications Ordered in Other Visits  Medication Dose Route Frequency Provider Last Rate Last Dose  . heparin lock flush 100 unit/mL  500 Units Intravenous Once Verlon Au, NP      . sodium chloride flush (NS) 0.9 % injection 10 mL  10 mL Intravenous Once Verlon Au, NP        OBJECTIVE: There were no vitals filed for this visit.   There is no height or weight on file to calculate BMI.    ECOG FS:1 - Symptomatic but completely ambulatory  General: Well-developed, well-nourished, no acute distress. Eyes: Pink conjunctiva, anicteric sclera. HEENT: Normocephalic, moist mucous membranes. Lungs: Clear to auscultation bilaterally. Heart: Regular rate and rhythm. No rubs, murmurs, or gallops. Abdomen: Soft, nontender, nondistended. No organomegaly noted, normoactive bowel sounds. Musculoskeletal: No edema, cyanosis, or clubbing. Neuro: Alert, answering all questions appropriately. Cranial nerves grossly intact. Skin: No rashes or petechiae noted. Psych: Normal affect.  LAB RESULTS:  Lab Results  Component Value Date   NA 141 07/18/2018   K 4.2 07/18/2018   CL 110 07/18/2018   CO2 27 07/18/2018   GLUCOSE 97 07/18/2018   BUN 18 07/18/2018   CREATININE 0.98 07/18/2018   CALCIUM 8.4 (L) 07/18/2018   PROT 7.0 07/16/2018   ALBUMIN 3.8 07/16/2018   AST 22 07/16/2018   ALT 14 07/16/2018   ALKPHOS 65 07/16/2018   BILITOT 0.4 07/16/2018   GFRNONAA 57 (L) 07/18/2018   GFRAA >60 07/18/2018    Lab Results  Component Value Date   WBC 4.3 09/27/2018   NEUTROABS 2.4 09/27/2018   HGB 9.0 (L) 09/27/2018   HCT 27.3 (L) 09/27/2018   MCV 81.0 09/27/2018   PLT 293 09/27/2018   Lab Results  Component Value Date   IRON 20 (L) 08/22/2018   TIBC 262 08/22/2018    IRONPCTSAT 8 (L) 08/22/2018    Lab Results  Component Value Date   FERRITIN 39 08/22/2018     STUDIES: No results found.  ASSESSMENT: Iron deficiency anemia, secondary to chronic blood loss.  PLAN:    1. Iron deficiency anemia, secondary to chronic blood loss: Patient's hemoglobin remains decreased, but significantly improved.  She also reports continued active bleeding. Nuclear medicine bleeding scan completed on August 23, 2018 did not reveal any significant GI bleed.  Bone marrow biopsy completed on February 15, 2017 did not reveal any significant pathology.  Patient will require a repeat capsule endoscopy in the near future and has an appointment with [facility] in the near future.  She does not require transfusion today, but will proceed with 510 mg IV Feraheme.  Return to clinic in 2 weeks for repeat laboratory work and further evaluation.   2. Melena: Patient has an appointment with [facility] in the near future.  Her most recent colonoscopy was on July 07, 2018.  She had an upper endoscopy also on July 07, 2018 and then a repeat endoscopy on July 17, 2018.  Repeat capsule endoscopy in the near future. 3. Chest pain/shortness of breath: Resolved.  Likely heart strain secondary to severe anemia.  Continue follow-up with cardiology as indicated.  I spent a total of 30 minutes face-to-face with the patient of which greater than 50% of the visit was spent in counseling and coordination of care as detailed above.   Patient expressed understanding and was in agreement with this plan. She also understands that She can call clinic at any time with any questions, concerns, or complaints.    Lloyd Huger, MD 10/08/18 8:17 PM

## 2018-10-11 ENCOUNTER — Emergency Department: Payer: Medicare HMO

## 2018-10-11 ENCOUNTER — Observation Stay
Admission: EM | Admit: 2018-10-11 | Discharge: 2018-10-12 | Disposition: A | Discharge status: Home / Self Care | Payer: Medicare HMO | Attending: Internal Medicine | Admitting: Internal Medicine

## 2018-10-11 ENCOUNTER — Encounter: Payer: Self-pay | Admitting: Emergency Medicine

## 2018-10-11 ENCOUNTER — Other Ambulatory Visit: Payer: Self-pay

## 2018-10-11 ENCOUNTER — Telehealth: Payer: Self-pay | Admitting: *Deleted

## 2018-10-11 ENCOUNTER — Inpatient Hospital Stay: Payer: Medicare HMO | Admitting: Oncology

## 2018-10-11 ENCOUNTER — Inpatient Hospital Stay: Payer: Medicare HMO

## 2018-10-11 DIAGNOSIS — Z8049 Family history of malignant neoplasm of other genital organs: Secondary | ICD-10-CM | POA: Insufficient documentation

## 2018-10-11 DIAGNOSIS — Z955 Presence of coronary angioplasty implant and graft: Secondary | ICD-10-CM | POA: Insufficient documentation

## 2018-10-11 DIAGNOSIS — Z880 Allergy status to penicillin: Secondary | ICD-10-CM | POA: Insufficient documentation

## 2018-10-11 DIAGNOSIS — Z794 Long term (current) use of insulin: Secondary | ICD-10-CM | POA: Insufficient documentation

## 2018-10-11 DIAGNOSIS — Z885 Allergy status to narcotic agent status: Secondary | ICD-10-CM | POA: Insufficient documentation

## 2018-10-11 DIAGNOSIS — Z882 Allergy status to sulfonamides status: Secondary | ICD-10-CM | POA: Insufficient documentation

## 2018-10-11 DIAGNOSIS — Z825 Family history of asthma and other chronic lower respiratory diseases: Secondary | ICD-10-CM | POA: Insufficient documentation

## 2018-10-11 DIAGNOSIS — H349 Unspecified retinal vascular occlusion: Secondary | ICD-10-CM | POA: Diagnosis not present

## 2018-10-11 DIAGNOSIS — I6529 Occlusion and stenosis of unspecified carotid artery: Secondary | ICD-10-CM | POA: Diagnosis not present

## 2018-10-11 DIAGNOSIS — Y9389 Activity, other specified: Secondary | ICD-10-CM | POA: Insufficient documentation

## 2018-10-11 DIAGNOSIS — W19XXXA Unspecified fall, initial encounter: Secondary | ICD-10-CM

## 2018-10-11 DIAGNOSIS — E041 Nontoxic single thyroid nodule: Secondary | ICD-10-CM | POA: Insufficient documentation

## 2018-10-11 DIAGNOSIS — K922 Gastrointestinal hemorrhage, unspecified: Secondary | ICD-10-CM | POA: Insufficient documentation

## 2018-10-11 DIAGNOSIS — D638 Anemia in other chronic diseases classified elsewhere: Secondary | ICD-10-CM | POA: Insufficient documentation

## 2018-10-11 DIAGNOSIS — E119 Type 2 diabetes mellitus without complications: Secondary | ICD-10-CM | POA: Diagnosis not present

## 2018-10-11 DIAGNOSIS — E7800 Pure hypercholesterolemia, unspecified: Secondary | ICD-10-CM | POA: Insufficient documentation

## 2018-10-11 DIAGNOSIS — Z886 Allergy status to analgesic agent status: Secondary | ICD-10-CM | POA: Insufficient documentation

## 2018-10-11 DIAGNOSIS — J90 Pleural effusion, not elsewhere classified: Secondary | ICD-10-CM | POA: Insufficient documentation

## 2018-10-11 DIAGNOSIS — I350 Nonrheumatic aortic (valve) stenosis: Secondary | ICD-10-CM | POA: Insufficient documentation

## 2018-10-11 DIAGNOSIS — D500 Iron deficiency anemia secondary to blood loss (chronic): Secondary | ICD-10-CM | POA: Insufficient documentation

## 2018-10-11 DIAGNOSIS — E669 Obesity, unspecified: Secondary | ICD-10-CM | POA: Diagnosis not present

## 2018-10-11 DIAGNOSIS — Z79899 Other long term (current) drug therapy: Secondary | ICD-10-CM | POA: Diagnosis not present

## 2018-10-11 DIAGNOSIS — Z803 Family history of malignant neoplasm of breast: Secondary | ICD-10-CM | POA: Insufficient documentation

## 2018-10-11 DIAGNOSIS — Y92002 Bathroom of unspecified non-institutional (private) residence single-family (private) house as the place of occurrence of the external cause: Secondary | ICD-10-CM | POA: Insufficient documentation

## 2018-10-11 DIAGNOSIS — S199XXA Unspecified injury of neck, initial encounter: Secondary | ICD-10-CM | POA: Diagnosis not present

## 2018-10-11 DIAGNOSIS — I10 Essential (primary) hypertension: Secondary | ICD-10-CM | POA: Diagnosis not present

## 2018-10-11 DIAGNOSIS — Z823 Family history of stroke: Secondary | ICD-10-CM | POA: Insufficient documentation

## 2018-10-11 DIAGNOSIS — I2510 Atherosclerotic heart disease of native coronary artery without angina pectoris: Secondary | ICD-10-CM | POA: Insufficient documentation

## 2018-10-11 DIAGNOSIS — S2249XA Multiple fractures of ribs, unspecified side, initial encounter for closed fracture: Secondary | ICD-10-CM | POA: Diagnosis present

## 2018-10-11 DIAGNOSIS — Z8711 Personal history of peptic ulcer disease: Secondary | ICD-10-CM | POA: Insufficient documentation

## 2018-10-11 DIAGNOSIS — Z85828 Personal history of other malignant neoplasm of skin: Secondary | ICD-10-CM | POA: Diagnosis not present

## 2018-10-11 DIAGNOSIS — Z87891 Personal history of nicotine dependence: Secondary | ICD-10-CM | POA: Insufficient documentation

## 2018-10-11 DIAGNOSIS — Z7951 Long term (current) use of inhaled steroids: Secondary | ICD-10-CM | POA: Insufficient documentation

## 2018-10-11 DIAGNOSIS — Z888 Allergy status to other drugs, medicaments and biological substances status: Secondary | ICD-10-CM | POA: Insufficient documentation

## 2018-10-11 DIAGNOSIS — Z833 Family history of diabetes mellitus: Secondary | ICD-10-CM | POA: Insufficient documentation

## 2018-10-11 DIAGNOSIS — W1839XA Other fall on same level, initial encounter: Secondary | ICD-10-CM | POA: Diagnosis not present

## 2018-10-11 DIAGNOSIS — S2242XA Multiple fractures of ribs, left side, initial encounter for closed fracture: Principal | ICD-10-CM | POA: Insufficient documentation

## 2018-10-11 DIAGNOSIS — S0990XA Unspecified injury of head, initial encounter: Secondary | ICD-10-CM | POA: Diagnosis not present

## 2018-10-11 DIAGNOSIS — I739 Peripheral vascular disease, unspecified: Secondary | ICD-10-CM | POA: Insufficient documentation

## 2018-10-11 DIAGNOSIS — R1012 Left upper quadrant pain: Secondary | ICD-10-CM | POA: Diagnosis not present

## 2018-10-11 DIAGNOSIS — S2232XA Fracture of one rib, left side, initial encounter for closed fracture: Secondary | ICD-10-CM | POA: Diagnosis not present

## 2018-10-11 LAB — GLUCOSE, CAPILLARY
GLUCOSE-CAPILLARY: 263 mg/dL — AB (ref 70–99)
GLUCOSE-CAPILLARY: 259 mg/dL — AB (ref 70–99)
Glucose-Capillary: 226 mg/dL — ABNORMAL HIGH (ref 70–99)

## 2018-10-11 LAB — BASIC METABOLIC PANEL
Anion gap: 8 (ref 5–15)
BUN: 17 mg/dL (ref 8–23)
CALCIUM: 9.3 mg/dL (ref 8.9–10.3)
CHLORIDE: 103 mmol/L (ref 98–111)
CO2: 28 mmol/L (ref 22–32)
CREATININE: 0.78 mg/dL (ref 0.44–1.00)
GFR calc Af Amer: >60 mL/min (ref >60)
GFR calc non Af Amer: >60 mL/min (ref >60)
GLUCOSE: 246 mg/dL — AB (ref 70–99)
Potassium: 4.7 mmol/L (ref 3.5–5.1)
Sodium: 139 mmol/L (ref 135–145)

## 2018-10-11 LAB — CBC WITH DIFFERENTIAL/PLATELET
Abs Immature Granulocytes: 0.03 10*3/uL (ref 0.00–0.07)
BASOS PCT: 1 %
Basophils Absolute: 0.1 10*3/uL (ref 0.0–0.1)
EOS ABS: 0.2 10*3/uL (ref 0.0–0.5)
Eosinophils Relative: 5 %
HEMATOCRIT: 32 % — AB (ref 36.0–46.0)
HEMOGLOBIN: 9.8 g/dL — AB (ref 12.0–15.0)
Immature Granulocytes: 1 %
LYMPHS ABS: 0.8 10*3/uL (ref 0.7–4.0)
Lymphocytes Relative: 18 %
MCH: 26.6 pg (ref 26.0–34.0)
MCHC: 30.6 g/dL (ref 30.0–36.0)
MCV: 86.7 fL (ref 80.0–100.0)
Monocytes Absolute: 0.5 10*3/uL (ref 0.1–1.0)
Monocytes Relative: 10 %
NEUTROS PCT: 65 %
NRBC: 0 % (ref 0.0–0.2)
Neutro Abs: 3 10*3/uL (ref 1.7–7.7)
Platelets: 265 10*3/uL (ref 150–400)
RBC: 3.69 MIL/uL — ABNORMAL LOW (ref 3.87–5.11)
RDW: 17.1 % — AB (ref 11.5–15.5)
WBC: 4.6 10*3/uL (ref 4.0–10.5)

## 2018-10-11 MED ORDER — INSULIN ASPART 100 UNIT/ML ~~LOC~~ SOLN
0.0000 [IU] | Freq: Every day | SUBCUTANEOUS | Status: DC
  Administered 2018-10-11: 3 [IU] via SUBCUTANEOUS
  Filled 2018-10-11: qty 1

## 2018-10-11 MED ORDER — SODIUM CHLORIDE 0.9% FLUSH
3.0000 mL | Freq: Two times a day (BID) | INTRAVENOUS | Status: DC
  Administered 2018-10-11 – 2018-10-12 (×3): 3 mL via INTRAVENOUS

## 2018-10-11 MED ORDER — ONDANSETRON HCL 4 MG PO TABS: 4.0000 mg | ORAL_TABLET | Freq: Four times a day (QID) | ORAL | Status: DC | PRN

## 2018-10-11 MED ORDER — SODIUM CHLORIDE 0.9% FLUSH: 3.0000 mL | INTRAVENOUS | Status: DC | PRN

## 2018-10-11 MED ORDER — INSULIN ASPART 100 UNIT/ML ~~LOC~~ SOLN
0.0000 [IU] | Freq: Three times a day (TID) | SUBCUTANEOUS | Status: DC
  Administered 2018-10-11: 5 [IU] via SUBCUTANEOUS
  Administered 2018-10-12 (×2): 3 [IU] via SUBCUTANEOUS
  Administered 2018-10-12: 5 [IU] via SUBCUTANEOUS
  Filled 2018-10-11 (×4): qty 1

## 2018-10-11 MED ORDER — PANTOPRAZOLE SODIUM 40 MG PO TBEC
40.0000 mg | DELAYED_RELEASE_TABLET | Freq: Every day | ORAL | Status: DC
  Administered 2018-10-11 – 2018-10-12 (×2): 40 mg via ORAL
  Filled 2018-10-11 (×2): qty 1

## 2018-10-11 MED ORDER — FENTANYL CITRATE (PF) 100 MCG/2ML IJ SOLN
50.0000 ug | Freq: Once | INTRAMUSCULAR | Status: AC
  Administered 2018-10-11: 50 ug via INTRAVENOUS
  Filled 2018-10-11: qty 2

## 2018-10-11 MED ORDER — INSULIN ASPART PROT & ASPART (70-30 MIX) 100 UNIT/ML ~~LOC~~ SUSP: 70.0000 [IU] | Freq: Every day | SUBCUTANEOUS | Status: DC

## 2018-10-11 MED ORDER — ACETAMINOPHEN 650 MG RE SUPP: 650.0000 mg | Freq: Four times a day (QID) | RECTAL | Status: DC | PRN

## 2018-10-11 MED ORDER — ONDANSETRON HCL 4 MG/2ML IJ SOLN
4.0000 mg | Freq: Four times a day (QID) | INTRAMUSCULAR | Status: DC | PRN
  Administered 2018-10-11: 4 mg via INTRAVENOUS
  Filled 2018-10-11: qty 2

## 2018-10-11 MED ORDER — ENOXAPARIN SODIUM 40 MG/0.4ML ~~LOC~~ SOLN
40.0000 mg | SUBCUTANEOUS | Status: DC
  Administered 2018-10-11 – 2018-10-12 (×2): 40 mg via SUBCUTANEOUS
  Filled 2018-10-11 (×2): qty 0.4

## 2018-10-11 MED ORDER — INSULIN ASPART PROT & ASPART (70-30 MIX) 100 UNIT/ML ~~LOC~~ SUSP
25.0000 [IU] | Freq: Two times a day (BID) | SUBCUTANEOUS | Status: DC
  Administered 2018-10-11 – 2018-10-12 (×2): 25 [IU] via SUBCUTANEOUS
  Filled 2018-10-11 (×2): qty 10

## 2018-10-11 MED ORDER — ACETAMINOPHEN 325 MG PO TABS
650.0000 mg | ORAL_TABLET | Freq: Four times a day (QID) | ORAL | Status: DC | PRN
  Administered 2018-10-11: 650 mg via ORAL
  Filled 2018-10-11: qty 2

## 2018-10-11 MED ORDER — SENNOSIDES-DOCUSATE SODIUM 8.6-50 MG PO TABS: 1.0000 | ORAL_TABLET | Freq: Every evening | ORAL | Status: DC | PRN

## 2018-10-11 MED ORDER — INSULIN ASPART PROT & ASPART (70-30 MIX) 100 UNIT/ML ~~LOC~~ SUSP: 50.0000 [IU] | Freq: Every day | SUBCUTANEOUS | Status: DC

## 2018-10-11 MED ORDER — BISACODYL 5 MG PO TBEC
5.0000 mg | DELAYED_RELEASE_TABLET | Freq: Every day | ORAL | Status: DC | PRN
  Administered 2018-10-12: 5 mg via ORAL
  Filled 2018-10-11: qty 1

## 2018-10-11 MED ORDER — INSULIN ASPART PROT & ASPART (70-30 MIX) 100 UNIT/ML ~~LOC~~ SUSP: 30.0000 [IU] | Freq: Every day | SUBCUTANEOUS | Status: DC

## 2018-10-11 MED ORDER — MORPHINE SULFATE (PF) 4 MG/ML IV SOLN
4.0000 mg | INTRAVENOUS | Status: DC | PRN
  Administered 2018-10-11 – 2018-10-12 (×5): 4 mg via INTRAVENOUS
  Filled 2018-10-11 (×5): qty 1

## 2018-10-11 MED ORDER — SODIUM CHLORIDE 0.9 % IV SOLN: 250.0000 mL | INTRAVENOUS | Status: DC | PRN

## 2018-10-11 MED ORDER — METOPROLOL TARTRATE 50 MG PO TABS
100.0000 mg | ORAL_TABLET | Freq: Two times a day (BID) | ORAL | Status: DC
  Administered 2018-10-11 – 2018-10-12 (×3): 100 mg via ORAL
  Filled 2018-10-11 (×3): qty 2

## 2018-10-11 MED ORDER — TIOTROPIUM BROMIDE MONOHYDRATE 18 MCG IN CAPS
18.0000 ug | ORAL_CAPSULE | Freq: Every day | RESPIRATORY_TRACT | Status: DC
  Administered 2018-10-11 – 2018-10-12 (×2): 18 ug via RESPIRATORY_TRACT
  Filled 2018-10-11: qty 5

## 2018-10-11 MED ORDER — ALBUTEROL SULFATE (2.5 MG/3ML) 0.083% IN NEBU: 2.5000 mg | INHALATION_SOLUTION | RESPIRATORY_TRACT | Status: DC | PRN

## 2018-10-11 MED ORDER — INSULIN ASPART PROT & ASPART (70-30 MIX) 100 UNIT/ML ~~LOC~~ SUSP: 40.0000 [IU] | Freq: Every day | SUBCUTANEOUS | Status: DC

## 2018-10-11 NOTE — H&P (Addendum)
Joyce Potter at [place] NAME: Joyce Potter    MR#:  [id number]  DATE OF BIRTH:  1946-09-16  DATE OF ADMISSION:  10/11/2018  PRIMARY CARE PHYSICIAN: Joyce Pink, MD   REQUESTING/REFERRING PHYSICIAN: Dr. Burlene Arnt.  CHIEF COMPLAINT:   Chief Complaint  Patient presents with  . Fall   Fall and left chest pain today. HISTORY OF PRESENT ILLNESS:  Joyce Potter  is a 72 y.o. female with a known history of anemia, aortic stenosis, basal cell carcinoma, CAD status post stent placement, aortic stenosis, carotid stenosis, hypertension, diabetes, hyperlipidemia and PUD.  The patient is sent from home to ED due to above chief complaint.  She sipped and injured left side of the chest when she got off the tube this morning.  She complains of extremity left-sided chest pain 10 out of 10, exacerbated by movement and breathing.  Chest x-ray showed left 6th-8th rib fractures.  Head CT is unremarkable.  Dr. Burlene Arnt requested admission for pain control.  PAST MEDICAL HISTORY:   Past Medical History:  Diagnosis Date  . Anemia    iron deficiency  . Aortic stenosis   . Basal cell carcinoma   . CAD (coronary artery disease)    s/p Left circumflex stent in 2012  . Carotid stenosis   . Cataract   . High cholesterol   . HTN (hypertension)   . Hyperlipidemia   . IDDM (insulin dependent diabetes mellitus) ([place])   . Multiple thyroid nodules    Benign  . Peptic ulcer disease   . Retinal artery occlusion    on left    PAST SURGICAL HISTORY:   Past Surgical History:  Procedure Laterality Date  . ARTERY BIOPSY Left 11/19/2015   Procedure: BIOPSY TEMPORAL ARTERY;  Surgeon: Algernon Huxley, MD;  Location: [facility] ORS;  Service: Vascular;  Laterality: Left;  . BREAST BIOPSY Left 2002   core- neg  . CARDIAC CATHETERIZATION N/A 08/08/2015   Procedure: Right and Left Heart Cath and Coronary Angiography;  Surgeon: Teodoro Spray, MD;  Location: [place] CV  LAB;  Service: Cardiovascular;  Laterality: N/A;  . CARDIAC CATHETERIZATION Bilateral 09/03/2016   Procedure: Right/Left Heart Cath and Coronary Angiography;  Surgeon: Teodoro Spray, MD;  Location: [place] [facility] LAB;  Service: Cardiovascular;  Laterality: Bilateral;  . CATARACT EXTRACTION W/ INTRAOCULAR LENS  IMPLANT, BILATERAL    . COLONOSCOPY     in 2013- internal hemorrhoids  . COLONOSCOPY WITH PROPOFOL N/A 07/07/2018   Procedure: COLONOSCOPY WITH PROPOFOL;  Surgeon: Manya Silvas, MD;  Location: [facility] ENDOSCOPY;  Service: Endoscopy;  Laterality: N/A;  . CORONARY ANGIOGRAPHY N/A 09/14/2017   Procedure: CORONARY ANGIOGRAPHY;  Surgeon: Teodoro Spray, MD;  Location: [place] [facility] LAB;  Service: Cardiovascular;  Laterality: N/A;  . CORONARY ANGIOPLASTY    . CORONARY STENT PLACEMENT    . ENDARTERECTOMY Left 10/01/2015   Procedure: ENDARTERECTOMY CAROTID;  Surgeon: Algernon Huxley, MD;  Location: [facility] ORS;  Service: Vascular;  Laterality: Left;  . ESOPHAGOGASTRODUODENOSCOPY    . ESOPHAGOGASTRODUODENOSCOPY (EGD) WITH PROPOFOL N/A 05/31/2016   Procedure: ESOPHAGOGASTRODUODENOSCOPY (EGD) WITH PROPOFOL;  Surgeon: Manya Silvas, MD;  Location: [facility] ENDOSCOPY;  Service: Endoscopy;  Laterality: N/A;  . ESOPHAGOGASTRODUODENOSCOPY (EGD) WITH PROPOFOL N/A 07/07/2018   Procedure: ESOPHAGOGASTRODUODENOSCOPY (EGD) WITH PROPOFOL;  Surgeon: Manya Silvas, MD;  Location: [facility] ENDOSCOPY;  Service: Endoscopy;  Laterality: N/A;  . ESOPHAGOGASTRODUODENOSCOPY (EGD) WITH PROPOFOL N/A 07/17/2018   Procedure: ESOPHAGOGASTRODUODENOSCOPY (  EGD) WITH PROPOFOL;  Surgeon: Lucilla Lame, MD;  Location: [facility] ENDOSCOPY;  Service: Endoscopy;  Laterality: N/A;  . EYE SURGERY    . GIVENS CAPSULE STUDY N/A 04/17/2013   Procedure: GIVENS CAPSULE STUDY;  Surgeon: Arta Silence, MD;  Location: [facility] ENDOSCOPY;  Service: Endoscopy;  Laterality: N/A;  patient ate breakfast at 7am   . PARTIAL HYSTERECTOMY    . RIGHT AND LEFT HEART  CATH Bilateral 09/14/2017   Procedure: RIGHT AND LEFT HEART CATH;  Surgeon: Teodoro Spray, MD;  Location: [place] CV LAB;  Service: Cardiovascular;  Laterality: Bilateral;  . TRIGGER FINGER RELEASE      SOCIAL HISTORY:   Social History   Tobacco Use  . Smoking status: Former Smoker    Last attempt to quit: 02/15/1985    Years since quitting: 33.6  . Smokeless tobacco: Never Used  . Tobacco comment: quit about 31 years ago  Substance Use Topics  . Alcohol use: No    Alcohol/week: 0.0 standard drinks    FAMILY HISTORY:   Family History  Problem Relation Age of Onset  . Uterine cancer Mother   . Breast cancer Mother 4  . Seizures Father   . Stroke Father   . Diabetes Father   . COPD Father   . Colon cancer Neg Hx     DRUG ALLERGIES:   Allergies  Allergen Reactions  . Aspirin Other (See Comments)    ulcers  . Invokamet [Canagliflozin-Metformin Hcl] Other (See Comments)    Yeast infection  . Sulfa Antibiotics Other (See Comments)    "Got Drunk"  . Lovastatin Rash  . Penicillins Rash    Has patient had a PCN reaction causing immediate rash, facial/tongue/throat swelling, SOB or lightheadedness with hypotension:Yes Has patient had a PCN reaction causing severe rash involving mucus membranes or skin necrosis:all over body Has patient had a PCN reaction that required hospitalization: No Has patient had a PCN reaction occurring within the last 10 years: No If all of the above answers are "NO", then may proceed with Cephalosporin use.   . Vicodin [Hydrocodone-Acetaminophen] Rash    REVIEW OF SYSTEMS:   Review of Systems  Constitutional: Negative for chills, fever and malaise/fatigue.  HENT: Negative for sore throat.   Eyes: Negative for blurred vision and double vision.  Respiratory: Negative for cough, hemoptysis, shortness of breath, wheezing and stridor.   Cardiovascular: Positive for chest pain. Negative for palpitations, orthopnea and leg swelling.    Gastrointestinal: Negative for abdominal pain, blood in stool, diarrhea, melena, nausea and vomiting.  Genitourinary: Negative for dysuria, flank pain and hematuria.  Musculoskeletal: Negative for back pain and joint pain.  Skin: Negative for rash.  Neurological: Negative for dizziness, sensory change, focal weakness, seizures, loss of consciousness, weakness and headaches.  Endo/Heme/Allergies: Negative for polydipsia.  Psychiatric/Behavioral: Negative for depression. The patient is not nervous/anxious.     MEDICATIONS AT HOME:   Prior to Admission medications   Medication Sig Start Date End Date Taking? Authorizing Provider  albuterol (PROAIR HFA) 108 (90 Base) MCG/ACT inhaler Inhale 2 puffs into the lungs every 4 (four) hours as needed for wheezing or shortness of breath.    Yes [provider]  alendronate (FOSAMAX) 70 MG tablet Take 70 mg by mouth every Tuesday.    Yes [provider]  Emollient (CERAVE) CREA Apply 1 application topically daily as needed (for dry skin).    Yes [provider]  insulin NPH-regular Human (NOVOLIN 70/30) (70-30) 100 UNIT/ML injection  Inject 50-70 Units into the skin See admin instructions. 70 units every morning and 50 units every afternoon   Yes [provider]  metoprolol tartrate (LOPRESSOR) 50 MG tablet Take 2 tablets (100 mg total) by mouth 2 (two) times daily. 07/18/18  Yes Salary, Avel Peace, MD  omeprazole (PRILOSEC) 40 MG capsule Take 40 mg by mouth 2 (two) times daily.    Yes [provider]  tiotropium (SPIRIVA HANDIHALER) 18 MCG inhalation capsule Place 18 mcg into inhaler and inhale daily.  09/22/17 10/11/18 Yes [provider]  losartan (COZAAR) 50 MG tablet Take 1 tablet (50 mg total) by mouth daily with breakfast. Patient not taking: Reported on 10/11/2018 07/18/18   Salary, Holly Bodily D, MD  pravastatin (PRAVACHOL) 10 MG tablet Take 1 tablet (10 mg total) by mouth daily at 6 PM. Patient not  taking: Reported on 10/11/2018 09/29/15   Bettey Costa, MD      VITAL SIGNS:  Blood pressure (!) 165/53, pulse 65, temperature 97.9 F (36.6 C), temperature source Oral, resp. rate 16, SpO2 98 %.  PHYSICAL EXAMINATION:  Physical Exam  GENERAL:  72 y.o.-year-old patient lying in the bed with no acute distress.  Obesity. EYES: Pupils equal, round, reactive to light and accommodation. No scleral icterus. Extraocular muscles intact.  HEENT: Head atraumatic, normocephalic. Oropharynx and nasopharynx clear.  NECK:  Supple, no jugular venous distention. No thyroid enlargement, no tenderness.  LUNGS: Normal breath sounds bilaterally, no wheezing, rales,rhonchi or crepitation. No use of accessory muscles of respiration.  Tenderness on the left side of the chest wall. CARDIOVASCULAR: S1, S2 normal.  Systolic murmurs 3/6, rubs, or gallops.  ABDOMEN: Soft, nontender, nondistended. Bowel sounds present. No organomegaly or mass.  EXTREMITIES: No pedal edema, cyanosis, or clubbing.  NEUROLOGIC: Cranial nerves II through XII are intact. Muscle strength 5/5 in all extremities. Sensation intact. Gait not checked.  PSYCHIATRIC: The patient is alert and oriented x 3.  SKIN: No obvious rash, lesion, or ulcer.   LABORATORY PANEL:   CBC Recent Labs  Lab 10/11/18 0859  WBC 4.6  HGB 9.8*  HCT 32.0*  PLT 265   ------------------------------------------------------------------------------------------------------------------  Chemistries  Recent Labs  Lab 10/11/18 0859  NA 139  K 4.7  CL 103  CO2 28  GLUCOSE 246*  BUN 17  CREATININE 0.78  CALCIUM 9.3   ------------------------------------------------------------------------------------------------------------------  Cardiac Enzymes No results for input(s): TROPONINI in the last 168 hours. ------------------------------------------------------------------------------------------------------------------  RADIOLOGY:  Dg Chest 2 View  Result  Date: 10/11/2018 CLINICAL DATA:  The patient fell out of the bathtub this morning with onset of severe left rib pain. EXAM: CHEST - 2 VIEW COMPARISON:  PA and lateral chest 07/16/2018. FINDINGS: There is cardiomegaly without edema. Trace left pleural effusion. No right effusion. No pneumothorax. Lungs are clear. The patient has acute fractures of the left sixth through eighth ribs. No other acute bony abnormality is identified. IMPRESSION: Acute fractures of left ribs, likely the sixth through eighth. Negative for pneumothorax. Cardiomegaly without edema. Electronically Signed   By: Inge Rise M.D.   On: 10/11/2018 09:54   Ct Head Wo Contrast  Result Date: 10/11/2018 CLINICAL DATA:  The patient suffered a fall out of a bathtub today. Initial encounter. EXAM: CT HEAD WITHOUT CONTRAST TECHNIQUE: Contiguous axial images were obtained from the base of the skull through the vertex without intravenous contrast. COMPARISON:  Head CT 12/26/2017. FINDINGS: Brain: No evidence of acute infarction, hemorrhage, hydrocephalus, extra-axial collection or mass lesion/mass effect. Vascular: No hyperdense  vessel or unexpected calcification. Skull: Intact.  No focal lesion. Sinuses/Orbits: Status post cataract surgery. Otherwise unremarkable. Other: None. IMPRESSION: Negative head CT. Electronically Signed   By: Inge Rise M.D.   On: 10/11/2018 10:00      IMPRESSION AND PLAN:   Left  6-8 th rib fractures The patient will be placed for observation. Pain control.  Nebulizer PRN.  Diabetes.  Continue NovoLog 70/30, start sliding scale.  Hypertension.  Continue Lopressor.  Anemia of chronic disease.  Stable.  Obesity.  All the records are reviewed and case discussed with ED provider. Management plans discussed with the patient, daughter, son-in-law and granddaughter and they are in agreement.  CODE STATUS: Full code.  TOTAL TIME TAKING CARE OF THIS PATIENT: 36 minutes.    Demetrios Loll M.D on  10/11/2018 at 11:00 AM  Between 7am to 6pm - Pager - [phone]  After 6pm go to [email or web address] - Proofreader  [facility]  Office  [phone]  CC: Primary care physician; Joyce Pink, MD   Note: This dictation was prepared with Dragon dictation along with smaller phrase technology. Any transcriptional errors that result from this process are unin

## 2018-10-11 NOTE — ED Provider Notes (Signed)
[facility] Emergency Department Provider Note  ____________________________________________   I have reviewed the triage vital signs and the nursing notes. Where available I have reviewed prior notes and, if possible and indicated, outside hospital notes.    HISTORY  Chief Complaint Fall    HPI Joyce Potter is a 72 y.o. female  not on blood thinners, states she had a non-syncopal fall this morning.  She was getting out of the tub when she slipped.  She landed on her left ribs did not hit her head.  Did not pass out, complains of pain to the left rib cage.  No abdominal pain no other complaints.  No fever no chills.  Hurts to breathe hurts to move, significant discomfort.   Sharp nonradiating very localized, no other discomfort no neck pain no back pain   Past Medical History:  Diagnosis Date  . Anemia    iron deficiency  . Aortic stenosis   . Basal cell carcinoma   . CAD (coronary artery disease)    s/p Left circumflex stent in 2012  . Carotid stenosis   . Cataract   . High cholesterol   . HTN (hypertension)   . Hyperlipidemia   . IDDM (insulin dependent diabetes mellitus) ([place])   . Multiple thyroid nodules    Benign  . Peptic ulcer disease   . Retinal artery occlusion    on left    Patient Active Problem List   Diagnosis Date Noted  . GIB (gastrointestinal bleeding) 07/17/2018  . Melena   . Symptomatic anemia   . PVD (peripheral vascular disease) ([place]) 02/11/2017  . Iron deficiency anemia due to chronic blood loss 07/13/2016  . Central retinal artery occlusion 10/01/2015  . Carotid stenosis 10/01/2015  . Stroke ([place]) 09/28/2015  . GI bleed from an occult source with recurrent chronic blood loss anemia 04/14/2013  . CAD (coronary artery disease) with demand ischemia from anemia 04/14/2013  . IDDM (insulin dependent diabetes mellitus) ([place]) 04/14/2013  . HTN (hypertension) 04/14/2013  . Chest pain 04/14/2013    Past Surgical  History:  Procedure Laterality Date  . ARTERY BIOPSY Left 11/19/2015   Procedure: BIOPSY TEMPORAL ARTERY;  Surgeon: Algernon Huxley, MD;  Location: [facility] ORS;  Service: Vascular;  Laterality: Left;  . BREAST BIOPSY Left 2002   core- neg  . CARDIAC CATHETERIZATION N/A 08/08/2015   Procedure: Right and Left Heart Cath and Coronary Angiography;  Surgeon: Teodoro Spray, MD;  Location: [place] CV LAB;  Service: Cardiovascular;  Laterality: N/A;  . CARDIAC CATHETERIZATION Bilateral 09/03/2016   Procedure: Right/Left Heart Cath and Coronary Angiography;  Surgeon: Teodoro Spray, MD;  Location: [place] CV LAB;  Service: Cardiovascular;  Laterality: Bilateral;  . CATARACT EXTRACTION W/ INTRAOCULAR LENS  IMPLANT, BILATERAL    . COLONOSCOPY     in 2013- internal hemorrhoids  . COLONOSCOPY WITH PROPOFOL N/A 07/07/2018   Procedure: COLONOSCOPY WITH PROPOFOL;  Surgeon: Manya Silvas, MD;  Location: [facility] ENDOSCOPY;  Service: Endoscopy;  Laterality: N/A;  . CORONARY ANGIOGRAPHY N/A 09/14/2017   Procedure: CORONARY ANGIOGRAPHY;  Surgeon: Teodoro Spray, MD;  Location: [place] CV LAB;  Service: Cardiovascular;  Laterality: N/A;  . CORONARY ANGIOPLASTY    . CORONARY STENT PLACEMENT    . ENDARTERECTOMY Left 10/01/2015   Procedure: ENDARTERECTOMY CAROTID;  Surgeon: Algernon Huxley, MD;  Location: [facility] ORS;  Service: Vascular;  Laterality: Left;  . ESOPHAGOGASTRODUODENOSCOPY    . ESOPHAGOGASTRODUODENOSCOPY (EGD) WITH PROPOFOL N/A  05/31/2016   Procedure: ESOPHAGOGASTRODUODENOSCOPY (EGD) WITH PROPOFOL;  Surgeon: Manya Silvas, MD;  Location[facility];  Service: Endoscopy;  Laterality: N/A;  . ESOPHAGOGASTRODUODENOSCOPY (EGD) WITH PROPOFOL N/A 07/07/2018   Procedure: ESOPHAGOGASTRODUODENOSCOPY (EGD) WITH PROPOFOL;  Surgeon: Manya Silvas, MD;  Location: [facility] END[facility]COPY;  Service: Endoscopy;  Laterality: N/A;  . ESOPHAGOGASTRODUODENOSCOPY (EGD) WITH PROPOFOL N/A 07/17/2018   Procedure:  ESOPHAGOGASTRODUODENOSCOPY (EGD) WITH PROPOFOL;  Surgeon: Lucilla Lame, MD;  Location: [facility] ENDOSCOPY;  Service: Endoscopy;  Laterality: N/A;  . EYE SURGERY    . GIVENS CAPSULE STUDY N/A 04/17/2013   Procedure: GIVENS CAPSULE STUDY;  Surgeon: Arta Silence, MD;  Location: [facility] ENDOSCOPY;  Service: Endoscopy;  Laterality: N/A;  patient ate breakfast at 7am   . PARTIAL HYSTERECTOMY    . RIGHT AND LEFT HEART CATH Bilateral 09/14/2017   Procedure: RIGHT AND LEFT HEART CATH;  Surgeon: Teodoro Spray, MD;  Location: [place] CV LAB;  Service: Cardiovascular;  Laterality: Bilateral;  . TRIGGER FINGER RELEASE      Prior to Admission medications   Medication Sig Start Date End Date Taking? Authorizing Provider  albuterol (PROAIR HFA) 108 (90 Base) MCG/ACT inhaler Inhale 2 puffs into the lungs every 4 (four) hours as needed. For shortness of breath/wheezing. 02/16/16   [provider]  alendronate (FOSAMAX) 70 MG tablet Take 70 mg by mouth once a week. Take with a full glass of water on an empty stomach. (Tuesdays)    [provider]  amitriptyline (ELAVIL) 25 MG tablet Take 25 mg by mouth at bedtime.  11/01/17 11/01/18  [provider]  Ascorbic Acid (VITAMIN C) 1000 MG tablet Take 1,000 mg by mouth daily.    [provider]  Emollient (CERAVE) CREA Apply 1 application topically daily.    [provider]  gabapentin (NEURONTIN) 300 MG capsule Take 300 mg by mouth at bedtime.  07/01/16 09/09/18  [provider]  insulin NPH-regular Human (NOVOLIN 70/30) (70-30) 100 UNIT/ML injection Inject 27-50 Units into the skin 2 (two) times daily. 50 units in the morning, & 27 units in the afternoon. 07/16/16   [provider]  isosorbide mononitrate (IMDUR) 30 MG 24 hr tablet Take by mouth. 08/22/18 08/22/19  [provider]  losartan (COZAAR) 50 MG tablet Take 1 tablet (50 mg total) by mouth daily with breakfast. 07/18/18   Salary, Avel Peace, MD   metoprolol tartrate (LOPRESSOR) 50 MG tablet Take 2 tablets (100 mg total) by mouth 2 (two) times daily. 07/18/18   Salary, Avel Peace, MD  omeprazole (PRILOSEC) 40 MG capsule Take 40 mg by mouth 2 (two) times daily.     [provider]  OVER THE COUNTER MEDICATION Apply 1 application topically at bedtime. REVITALIFT    [provider]  pravastatin (PRAVACHOL) 10 MG tablet Take 1 tablet (10 mg total) by mouth daily at 6 PM. 09/29/15   Mody, Sital, MD  sucralfate (CARAFATE) 1 g tablet DISSOLVE 1 TABLET IN 15ML OF WATER TO CREATE A SLURRY. TAKE BY MOUTH THREE TIMES DAILY BEFORE MEAL(S). 07/08/18   [provider]  tiotropium (SPIRIVA HANDIHALER) 18 MCG inhalation capsule Place 18 mcg into inhaler and inhale daily.  09/22/17 09/22/18  [provider]    Allergies Aspirin; Invokamet [canagliflozin-metformin hcl]; Sulfa antibiotics; Lovastatin; Penicillins; and Vicodin [hydrocodone-acetaminophen]  Family History  Problem Relation Age of Onset  . Uterine cancer Mother   . Breast cancer Mother 11  . Seizures Father   . Stroke Father   .  Diabetes Father   . COPD Father   . Colon cancer Neg Hx     Social History Social History   Tobacco Use  . Smoking status: Former Smoker    Last attempt to quit: 02/15/1985    Years since quitting: 33.6  . Smokeless tobacco: Never Used  . Tobacco comment: quit about 31 years ago  Substance Use Topics  . Alcohol use: No    Alcohol/week: 0.0 standard drinks  . Drug use: No    Review of Systems Constitutional: No fever/chills Eyes: No visual changes. ENT: No sore throat. No stiff neck no neck pain Cardiovascular: Denies chest pain. Respiratory: Denies shortness of breath. Gastrointestinal:   no vomiting.  No diarrhea.  No constipation. Genitourinary: Negative for dysuria. Musculoskeletal: Negative lower extremity swelling Skin: Negative for rash. Neurological: Negative for severe headaches, focal weakness or  numbness.   ____________________________________________   PHYSICAL EXAM:  VITAL SIGNS: ED Triage Vitals  Enc Vitals Group     BP 10/11/18 0900 (!) 165/53     Pulse Rate 10/11/18 0900 65     Resp 10/11/18 0900 16     Temp 10/11/18 0900 97.9 F (36.6 C)     Temp Source 10/11/18 0900 Oral     SpO2 10/11/18 0900 98 %     Weight --      Height --      Head Circumference --      Peak Flow --      Pain Score 10/11/18 0855 9     Pain Loc --      Pain Edu? --      Excl. in [place]? --     Constitutional: Alert and oriented. Well appearing and in no acute distress. Eyes: Conjunctivae are normal Head: Atraumatic HEENT: No congestion/rhinnorhea. Mucous membranes are moist.  Oropharynx non-erythematous Neck:   Nontender with no meningismus, no masses, no stridor Cardiovascular: Normal rate, regular rhythm. Grossly normal heart sounds.  Good peripheral circulation. Respiratory: Normal respiratory effort.  No retractions. Lungs CTAB. Chest: Tender palpation left chest wall necessary patient states "ouch that the pain right there, no crepitus or flail chest, no tenderness to palpation in the splenic region or liver region or anywhere in the abdomen at this time Abdominal: Soft and nontender. No distention. No guarding no rebound Back:  There is no focal tenderness or step off.  there is no midline tenderness there are no lesions noted. there is no CVA tenderness Musculoskeletal: No lower extremity tenderness, no upper extremity tenderness. No joint effusions, no DVT signs strong distal pulses no edema Neurologic:  Normal speech and language. No gross focal neurologic deficits are appreciated.  Skin:  Skin is warm, dry and intact. No rash noted. Psychiatric: Mood and affect are normal. Speech and behavior are normal.  ____________________________________________   LABS (all labs ordered are listed, but only abnormal results are displayed)  Labs Reviewed  CBC WITH DIFFERENTIAL/PLATELET -  Abnormal; Notable for the following components:      Result Value   RBC 3.69 (*)    Hemoglobin 9.8 (*)    HCT 32.0 (*)    RDW 17.1 (*)    All other components within normal limits  BASIC METABOLIC PANEL    Pertinent labs  results that were available during my care of the patient were reviewed by me and considered in my medical decision making (see chart for details). ____________________________________________  EKG  I personally interpreted any EKGs ordered by me or triage  ____________________________________________  RADIOLOGY  Pertinent labs & imaging results that were available during my care of the patient were reviewed by me and considered in my medical decision making (see chart for details). If possible, patient and/or family made aware of any abnormal findings.  No results found. ____________________________________________    PROCEDURES  Procedure(s) performed: None  Procedures  Critical Care performed: None  ____________________________________________   INITIAL IMPRESSION / ASSESSMENT AND PLAN / ED COURSE  Pertinent labs & imaging results that were available during my care of the patient were reviewed by me and considered in my medical decision making (see chart for details).  She is here after a non-syncopal fall on wet surface, landed on her left chest wall, has some rib fractures there, will likely require admission for pain control and further assessment.  Nothing to suggest pneumothorax or intra-abdominal pathology no evidence of significant closed head injury but given her age I did a CT scan which is negative.  Basic blood work is reassuring.    ____________________________________________   FINAL CLINICAL IMPRESSION(S) / ED DIAGNOSES  Final diagnoses:  None      This chart was dictated using voice recognition software.  Despite best efforts to proofread,  errors can occur which can change meaning.      Schuyler Amor, MD 10/11/18  1024

## 2018-10-11 NOTE — Progress Notes (Signed)
Advanced Care Plan.  Purpose of Encounter: CODE STATUS. Parties in Attendance: The patient, her daughter, son-in-law and granddaughter, me. Patient's Decisional Capacity: Yes. Medical Story: Joyce Potter  is a 72 y.o. female with a known history of anemia, aortic stenosis, basal cell carcinoma, CAD status post stent placement, carotid stenosis, hypertension, diabetes, hyperlipidemia and PUD.  The patient is being admitted for left rib fractures.  I discussed with patient about her current condition, prognosis and CODE STATUS.  The patient want to be resuscitated and intubated to get her back. Plan:  Code Status: Full code. Time spent discussing advance care planning: 16-17 minutes.

## 2018-10-11 NOTE — Progress Notes (Addendum)
Inpatient Diabetes Program Recommendations  AACE/ADA: New Consensus Statement on Inpatient Glycemic Control (2015)  Target Ranges:  Prepandial:   less than 140 mg/dL      Peak postprandial:   less than 180 mg/dL (1-2 hours)      Critically ill patients:  140 - 180 mg/dL   Results for Quinby, Jassmine R (MRN [id number]) as of 10/11/2018 12:42  Ref. Range 10/11/2018 11:57  Glucose-Capillary Latest Ref Range: 70 - 99 mg/dL 226 (H)   Review of Glycemic Control  Diabetes history: DM2 Outpatient Diabetes medications: 70/30 70 units QAM, 70/30 50 units QPM Current orders for Inpatient glycemic control: 70/30 70 units QAM, 70/30 50 units QPM  Inpatient Diabetes Program Recommendations:  Insulin - Basal: Please consider decreasing 70/30 to 25 units BID (which will provide a total of 35 units for basal and 15 units for meal coverage per day). Correction (SSI): Please consider ordering CBGs and Novolog 0-9 units TID with meals and Novolog 0-5 units QHS.  Addendum 10/11/18@14:05-Spoke with patient regarding DM control and to confirm outpatient insulin regimen. Patient states that she sees Dr. Solum (Endocrinologist) for DM control and that she is prescribed 70/30 70 units QAM and 70/30 50 units QPM. Inquired about consistently taking insulin and patient admits that she does not take morning 70/30 if glucose is low and she usually only takes 70/30 25 units with supper. Patient reports that she has taken 70/30 in the morning 3 times out of the past 7 days but she reports that she is consistently taking evening 70/30 25 units every day. Patient states that glucose ranges from low to 300-400 mg/dl at times. In talking with patient she reports that at times when she does take the 70/30 insulin, it seems not to work as it does not bring glucose down much sometimes. Patient reports that she is injecting insulin injections around her navel or in her left arm. Examined both and noted knotty tissue around her navel  but not on her left arm. Explained that insulin may not be absorbed through scar tissue.  Discussed insulin injection site rotation and asked patient to avoid using the area around her navel.  Inquired about inconsistence of 70/30 and patient reports that she had a hypoglycemic event in the middle of the night and since then she is scared about her glucose getting to low.  She reports that she was asleep and her granddaughter's dog woke her up and when she woke up she knew her glucose was low and she barely made it to the kitchen. When she checked her glucose was 34 mg/dl. Discussed 70/30 insulin (onset and duration) and explained why it should be taken BID consistently with meals. Discussed how hyperglycemia increases risk of complications and stressed importance of DM control.  Patient asked if there were other insulins she could take without experiencing hypoglycemia. Discussed other insulins (Lantus, Tresiba, Levemir), and patient reports that she has been on Lantus in the past and it was too expensive for her to afford which is why she is on 70/30 insulin.  Encouraged patient to call 1-800 number on the back of her Medicare card and see if she qualifies for any medication assistance programs. Also discussed FreeStyle Libre (glucose sensor) which could provide ability to check glucose more often and possibly decrease her fear of hypoglycemia. Patient states that she checked on getting one but was told she did not met the criteria for approval since she was only checking CBGs 3 times per day and   not the required 4 times a day. Encouraged patient to discuss with Dr. Gabriel Carina and perhaps Dr. Gabriel Carina could prescribe for her to check her glucose 4 times a day. Asked patient to keep a detailed record of glucose and exactly how much (if she took prescribed dose, a decreased dose, or skipped it) insulin she takes. Explained how this information can help Dr. Gabriel Carina adjust her insulin regimen to keep DM controlled but decrease  incidents of hypoglycemia. Patient verbalized understanding of information discussed and states that she has no further questions related to DM at this time.  Thanks, Barnie Alderman, RN, MSN, CDE Diabetes Coordinator Inpatient Diabetes Program [phone] (Team Pager from 8am to 5pm)

## 2018-10-11 NOTE — ED Triage Notes (Addendum)
Pt to ED via EMS from home with mechanical fall out of bathtub this am. PT c/o LFT rib cage pain. Denies head injury  VSS . MD at bedside

## 2018-10-11 NOTE — Telephone Encounter (Signed)
Patient's daughter Malachi Bonds) called and stated that her mother fell this morning  and is currently in [facility]. She wanted to make the MD aware  that she will not make it to her Lab/MD/Infusion that's scheduled for today.

## 2018-10-12 ENCOUNTER — Observation Stay: Payer: Medicare HMO

## 2018-10-12 ENCOUNTER — Inpatient Hospital Stay: Payer: Medicare HMO

## 2018-10-12 DIAGNOSIS — M25512 Pain in left shoulder: Secondary | ICD-10-CM | POA: Diagnosis not present

## 2018-10-12 DIAGNOSIS — E119 Type 2 diabetes mellitus without complications: Secondary | ICD-10-CM | POA: Diagnosis not present

## 2018-10-12 DIAGNOSIS — I10 Essential (primary) hypertension: Secondary | ICD-10-CM | POA: Diagnosis not present

## 2018-10-12 DIAGNOSIS — S2232XA Fracture of one rib, left side, initial encounter for closed fracture: Secondary | ICD-10-CM | POA: Diagnosis not present

## 2018-10-12 DIAGNOSIS — S199XXA Unspecified injury of neck, initial encounter: Secondary | ICD-10-CM | POA: Diagnosis not present

## 2018-10-12 DIAGNOSIS — E669 Obesity, unspecified: Secondary | ICD-10-CM | POA: Diagnosis not present

## 2018-10-12 LAB — GLUCOSE, CAPILLARY
GLUCOSE-CAPILLARY: 286 mg/dL — AB (ref 70–99)
Glucose-Capillary: 216 mg/dL — ABNORMAL HIGH (ref 70–99)
Glucose-Capillary: 207 mg/dL — ABNORMAL HIGH (ref 70–99)

## 2018-10-12 MED ORDER — INSULIN ASPART PROT & ASPART (70-30 MIX) 100 UNIT/ML ~~LOC~~ SUSP
30.0000 [IU] | Freq: Two times a day (BID) | SUBCUTANEOUS | Status: DC
  Administered 2018-10-12: 30 [IU] via SUBCUTANEOUS
  Filled 2018-10-12: qty 10

## 2018-10-12 MED ORDER — BISACODYL 5 MG PO TBEC: 5.0000 mg | DELAYED_RELEASE_TABLET | Freq: Every day | ORAL | 0 refills | Status: DC | PRN

## 2018-10-12 MED ORDER — SENNOSIDES-DOCUSATE SODIUM 8.6-50 MG PO TABS: 1.0000 | ORAL_TABLET | Freq: Two times a day (BID) | ORAL | 0 refills | Status: DC

## 2018-10-12 MED ORDER — MORPHINE SULFATE ER 15 MG PO TBCR
15.0000 mg | EXTENDED_RELEASE_TABLET | Freq: Two times a day (BID) | ORAL | Status: DC
  Administered 2018-10-12: 15 mg via ORAL
  Filled 2018-10-12: qty 1

## 2018-10-12 MED ORDER — SENNOSIDES-DOCUSATE SODIUM 8.6-50 MG PO TABS
1.0000 | ORAL_TABLET | Freq: Two times a day (BID) | ORAL | Status: DC
  Administered 2018-10-12: 1 via ORAL
  Filled 2018-10-12: qty 1

## 2018-10-12 MED ORDER — ACETAMINOPHEN 325 MG PO TABS: 650.0000 mg | ORAL_TABLET | Freq: Four times a day (QID) | ORAL | 0 refills | Status: DC | PRN

## 2018-10-12 MED ORDER — MORPHINE SULFATE ER 15 MG PO TBCR: 15.0000 mg | EXTENDED_RELEASE_TABLET | Freq: Two times a day (BID) | ORAL | 0 refills | Status: DC

## 2018-10-12 NOTE — Progress Notes (Signed)
Reviewed CT cervical spine finding. No acute fracture.  Pt is OK to be discharged home.

## 2018-10-12 NOTE — Progress Notes (Signed)
Discharge order received. Patient is alert and oriented. Vital signs stable . No signs of acute distress. Discharge instructions given. Patient verbalized understanding. No other issues noted at this time.   

## 2018-10-12 NOTE — Progress Notes (Signed)
Inpatient Diabetes Program Recommendations  AACE/ADA: New Consensus Statement on Inpatient Glycemic Control (2019)  Target Ranges:  Prepandial:   less than 140 mg/dL      Peak postprandial:   less than 180 mg/dL (1-2 hours)      Critically ill patients:  140 - 180 mg/dL   Results for Joyce Potter, Joyce Potter (MRN [id number]) as of 10/12/2018 11:51  Ref. Range 10/11/2018 11:57 10/11/2018 17:17 10/11/2018 21:44 10/12/2018 07:51 10/12/2018 11:45  Glucose-Capillary Latest Ref Range: 70 - 99 mg/dL 226 (H) 263 (H)  Novolog 5 units  70/30 25 units 259 (H)  Novolog 3 units 286 (H)  Novolog 5 units  70/30 25 units 216 (H)   Review of Glycemic Control Diabetes history: DM2 Outpatient Diabetes medications: 70/30 70 units QAM (not taking AM 70/30 consistently), 70/30 50 units QPM (taking 25 units QPM) Current orders for Inpatient glycemic control: 70/30 70 units QAM, 70/30 50 units QPM  Inpatient Diabetes Program Recommendations:  Insulin - Basal: Please consider increasing 70/30 to 30 units BID.   Thanks, Barnie Alderman, RN, MSN, CDE Diabetes Coordinator Inpatient Diabetes Program [phone] (Team Pager from 8am to 5pm)

## 2018-10-12 NOTE — Discharge Summary (Signed)
[facility] [place] NAME: Joyce Potter    MR#:  [id number]  DATE OF BIRTH:  11-17-1946  DATE OF ADMISSION:  10/11/2018 ADMITTING PHYSICIAN: Demetrios Loll, MD  DATE OF DISCHARGE: 10/12/2018   PRIMARY CARE PHYSICIAN: Maryland Pink, MD    ADMISSION DIAGNOSIS:  Closed fracture of multiple ribs of left side, initial encounter [S22.42XA]  DISCHARGE DIAGNOSIS:  Active Problems:   Multiple rib fractures   SECONDARY DIAGNOSIS:   Past Medical History:  Diagnosis Date  . Anemia    iron deficiency  . Aortic stenosis   . Basal cell carcinoma   . CAD (coronary artery disease)    s/p Left circumflex stent in 2012  . Carotid stenosis   . Cataract   . High cholesterol   . HTN (hypertension)   . Hyperlipidemia   . IDDM (insulin dependent diabetes mellitus) ([place])   . Multiple thyroid nodules    Benign  . Peptic ulcer disease   . Retinal artery occlusion    on left    HOSPITAL COURSE:   Left  6-8 th rib fractures Pain control.  Nebulizer PRN. Started on oral morphine long acting and advised  To take Tylenol PRN. She had Hx of GI bleed- advised not to take NSAIDs. Councelled to use incentive spirometer.  Shoulder and neck pain   Check Xray shoulder and CT cervical spine to r/o fractures.  Diabetes.  Continue NovoLog 70/30, start sliding scale.  Hypertension.  Continue Lopressor.  Anemia of chronic disease.  Stable.  Obesity.  DISCHARGE CONDITIONS:   Stable.  CONSULTS OBTAINED:    DRUG ALLERGIES:   Allergies  Allergen Reactions  . Aspirin Other (See Comments)    ulcers  . Invokamet [Canagliflozin-Metformin Hcl] Other (See Comments)    Yeast infection  . Sulfa Antibiotics Other (See Comments)    "Got Drunk"  . Lovastatin Rash  . Penicillins Rash    Has patient had a PCN reaction causing immediate rash, facial/tongue/throat swelling, SOB or lightheadedness with hypotension:Yes Has patient had a PCN  reaction causing severe rash involving mucus membranes or skin necrosis:all over body Has patient had a PCN reaction that required hospitalization: No Has patient had a PCN reaction occurring within the last 10 years: No If all of the above answers are "NO", then may proceed with Cephalosporin use.   . Vicodin [Hydrocodone-Acetaminophen] Rash    DISCHARGE MEDICATIONS:   Allergies as of 10/12/2018      Reactions   Aspirin Other (See Comments)   ulcers   Invokamet [canagliflozin-metformin Hcl] Other (See Comments)   Yeast infection   Sulfa Antibiotics Other (See Comments)   "Got Drunk"   Lovastatin Rash   Penicillins Rash   Has patient had a PCN reaction causing immediate rash, facial/tongue/throat swelling, SOB or lightheadedness with hypotension:Yes Has patient had a PCN reaction causing severe rash involving mucus membranes or skin necrosis:all over body Has patient had a PCN reaction that required hospitalization: No Has patient had a PCN reaction occurring within the last 10 years: No If all of the above answers are "NO", then may proceed with Cephalosporin use.   Vicodin [hydrocodone-acetaminophen] Rash      Medication List    STOP taking these medications   losartan 50 MG tablet Commonly known as:  COZAAR     TAKE these medications   acetaminophen 325 MG tablet Commonly known as:  TYLENOL Take 2 tablets (650 mg total) by mouth every 6 (six)  hours as needed for mild pain (or Fever >/= 101).   alendronate 70 MG tablet Commonly known as:  FOSAMAX Take 70 mg by mouth every Tuesday.   bisacodyl 5 MG EC tablet Commonly known as:  DULCOLAX Take 1 tablet (5 mg total) by mouth daily as needed for moderate constipation.   CERAVE Crea Apply 1 application topically daily as needed (for dry skin).   insulin NPH-regular Human (70-30) 100 UNIT/ML injection Commonly known as:  NOVOLIN 70/30 Inject 50-70 Units into the skin See admin instructions. 70 units every morning and  50 units every afternoon   metoprolol tartrate 50 MG tablet Commonly known as:  LOPRESSOR Take 2 tablets (100 mg total) by mouth 2 (two) times daily.   morphine 15 MG 12 hr tablet Commonly known as:  MS CONTIN Take 1 tablet (15 mg total) by mouth every 12 (twelve) hours.   omeprazole 40 MG capsule Commonly known as:  PRILOSEC Take 40 mg by mouth 2 (two) times daily.   pravastatin 10 MG tablet Commonly known as:  PRAVACHOL Take 1 tablet (10 mg total) by mouth daily at 6 PM.   PROAIR HFA 108 (90 Base) MCG/ACT inhaler Generic drug:  albuterol Inhale 2 puffs into the lungs every 4 (four) hours as needed for wheezing or shortness of breath.   senna-docusate 8.6-50 MG tablet Commonly known as:  Senokot-S Take 1 tablet by mouth 2 (two) times daily.   SPIRIVA HANDIHALER 18 MCG inhalation capsule Generic drug:  tiotropium Place 18 mcg into inhaler and inhale daily.        DISCHARGE INSTRUCTIONS:    Follow with PMD in 1 week.  If you experience worsening of your admission symptoms, develop shortness of breath, life threatening emergency, suicidal or homicidal thoughts you must seek medical attention immediately by calling 911 or calling your MD immediately  if symptoms less severe.  You Must read complete instructions/literature along with all the possible adverse reactions/side effects for all the Medicines you take and that have been prescribed to you. Take any new Medicines after you have completely understood and accept all the possible adverse reactions/side effects.   Please note  You were cared for by a hospitalist during your hospital stay. If you have any questions about your discharge medications or the care you received while you were in the hospital after you are discharged, you can call the unit and asked to speak with the hospitalist on call if the hospitalist that took care of you is not available. Once you are discharged, your primary care physician will handle any  further medical issues. Please note that NO REFILLS for any discharge medications will be authorized once you are discharged, as it is imperative that you return to your primary care physician (or establish a relationship with a primary care physician if you do not have one) for your aftercare needs so that they can reassess your need for medications and monitor your lab values.    Today   CHIEF COMPLAINT:   Chief Complaint  Patient presents with  . Fall    HISTORY OF PRESENT ILLNESS:  Joyce Potter  is a 72 y.o. female with a known history of anemia, aortic stenosis, basal cell carcinoma, CAD status post stent placement, aortic stenosis, carotid stenosis, hypertension, diabetes, hyperlipidemia and PUD.  The patient is sent from home to ED due to above chief complaint.  She sipped and injured left side of the chest when she got off the tube this morning.  She complains of extremity left-sided chest pain 10 out of 10, exacerbated by movement and breathing.  Chest x-ray showed left 6th-8th rib fractures.  Head CT is unremarkable.  Dr. Burlene Arnt requested admission for pain control.   VITAL SIGNS:  Blood pressure 131/63, pulse 69, temperature 98.1 F (36.7 C), temperature source Oral, resp. rate 16, SpO2 91 %.  I/O:    Intake/Output Summary (Last 24 hours) at 10/12/2018 1951 Last data filed at 10/12/2018 1852 Gross per 24 hour  Intake 363 ml  Output 900 ml  Net -537 ml    PHYSICAL EXAMINATION:  GENERAL:  72 y.o.-year-old patient lying in the bed with no acute distress.  EYES: Pupils equal, round, reactive to light and accommodation. No scleral icterus. Extraocular muscles intact.  HEENT: Head atraumatic, normocephalic. Oropharynx and nasopharynx clear.  NECK:  Supple, no jugular venous distention. No thyroid enlargement, no tenderness.  LUNGS: Normal breath sounds bilaterally, no wheezing, rales,rhonchi or crepitation. No use of accessory muscles of respiration. Left side chest  pain. CARDIOVASCULAR: S1, S2 normal. No murmurs, rubs, or gallops.  ABDOMEN: Soft, non-tender, non-distended. Bowel sounds present. No organomegaly or mass.  EXTREMITIES: No pedal edema, cyanosis, or clubbing.  NEUROLOGIC: Cranial nerves II through XII are intact. Muscle strength 5/5 in all extremities. Sensation intact. Gait not checked.  PSYCHIATRIC: The patient is alert and oriented x 3.  SKIN: No obvious rash, lesion, or ulcer.   DATA REVIEW:   CBC Recent Labs  Lab 10/11/18 0859  WBC 4.6  HGB 9.8*  HCT 32.0*  PLT 265    Chemistries  Recent Labs  Lab 10/11/18 0859  NA 139  K 4.7  CL 103  CO2 28  GLUCOSE 246*  BUN 17  CREATININE 0.78  CALCIUM 9.3    Cardiac Enzymes No results for input(s): TROPONINI in the last 168 hours.  Microbiology Results  Results for orders placed or performed during the hospital encounter of 09/28/15  MRSA PCR Screening     Status: None   Collection Time: 10/01/15  6:28 PM  Result Value Ref Range Status   MRSA by PCR NEGATIVE NEGATIVE Final    Comment:        The GeneXpert MRSA Assay (FDA approved for NASAL specimens only), is one component of a comprehensive MRSA colonization surveillance program. It is not intended to diagnose MRSA infection nor to guide or monitor treatment for MRSA infections.     RADIOLOGY:  Dg Chest 2 View  Result Date: 10/11/2018 CLINICAL DATA:  The patient fell out of the bathtub this morning with onset of severe left rib pain. EXAM: CHEST - 2 VIEW COMPARISON:  PA and lateral chest 07/16/2018. FINDINGS: There is cardiomegaly without edema. Trace left pleural effusion. No right effusion. No pneumothorax. Lungs are clear. The patient has acute fractures of the left sixth through eighth ribs. No other acute bony abnormality is identified. IMPRESSION: Acute fractures of left ribs, likely the sixth through eighth. Negative for pneumothorax. Cardiomegaly without edema. Electronically Signed   By: Inge Rise  M.D.   On: 10/11/2018 09:54   Ct Head Wo Contrast  Result Date: 10/11/2018 CLINICAL DATA:  The patient suffered a fall out of a bathtub today. Initial encounter. EXAM: CT HEAD WITHOUT CONTRAST TECHNIQUE: Contiguous axial images were obtained from the base of the skull through the vertex without intravenous contrast. COMPARISON:  Head CT 12/26/2017. FINDINGS: Brain: No evidence of acute infarction, hemorrhage, hydrocephalus, extra-axial collection or mass lesion/mass effect. Vascular: No hyperdense vessel  or unexpected calcification. Skull: Intact.  No focal lesion. Sinuses/Orbits: Status post cataract surgery. Otherwise unremarkable. Other: None. IMPRESSION: Negative head CT. Electronically Signed   By: Inge Rise M.D.   On: 10/11/2018 10:00   Ct Cervical Spine Wo Contrast  Result Date: 10/12/2018 CLINICAL DATA:  [place] yesterday.  Negative head CT at that time. EXAM: CT CERVICAL SPINE WITHOUT CONTRAST TECHNIQUE: Multidetector CT imaging of the cervical spine was performed without intravenous contrast. Multiplanar CT image reconstructions were also generated. COMPARISON:  None. FINDINGS: Alignment: No traumatic malalignment. Some straightening of the normal cervical lordosis. Skull base and vertebrae: No fracture or primary bone lesion. Soft tissues and spinal canal: Negative Disc levels: Degenerative spondylosis. No advanced disc space narrowing. Mild osteophytic encroachment upon the foramina bilaterally at C3-4 and C5-6. Facet osteoarthritis on the left at C3-4 and to a lesser extent at C4-5. Upper chest: Negative Other: None IMPRESSION: No acute or traumatic finding. Chronic degenerative spondylosis at C3-4 and C5-6. Facet arthropathy on the left at C3-4 more than C4-5. Electronically Signed   By: Nelson Chimes M.D.   On: 10/12/2018 18:51   Dg Shoulder Left  Result Date: 10/12/2018 CLINICAL DATA:  Left shoulder pain EXAM: LEFT SHOULDER - 2+ VIEW COMPARISON:  None. FINDINGS: There is no fracture  or dislocation. There are mild degenerative changes of the acromioclavicular joint. IMPRESSION: No acute osseous injury of the left shoulder. Electronically Signed   By: Kathreen Devoid   On: 10/12/2018 12:55    EKG:   Orders placed or performed during the hospital encounter of 07/16/18  . EKG 12-Lead  . EKG 12-Lead  . ED EKG within 10 minutes  . ED EKG within 10 minutes  . EKG      Management plans discussed with the patient, family and they are in agreement.  CODE STATUS: Full.    Code Status Orders  (From admission, onward)         Start     Ordered   10/11/18 1150  Full code  Continuous     10/11/18 1149        Code Status History    Date Active Date Inactive Code Status Order ID Comments User Context   07/17/2018 0248 07/18/2018 2029 Full Code [id number]  Amelia Jo, MD Inpatient   09/14/2017 0931 09/14/2017 1349 Full Code [id number]  Teodoro Spray, MD Inpatient   09/14/2017 0930 09/14/2017 0931 Full Code [id number]  Teodoro Spray, MD Inpatient   09/03/2016 0927 09/03/2016 1401 Full Code [id number]  Teodoro Spray, MD Inpatient   05/29/2016 1652 06/01/2016 1832 Full Code [id number]  Gladstone Lighter, MD Inpatient   10/01/2015 1512 10/02/2015 1803 Full Code [id number]  Algernon Huxley, MD Inpatient   09/28/2015 1553 10/01/2015 1512 Full Code [id number]  Loletha Grayer, MD ED   04/14/2013 0202 04/18/2013 1655 Full Code [id number]  Etta Quill., DO ED      TOTAL TIME TAKING CARE OF THIS PATIENT: 35 minutes.    Vaughan Basta M.D on 10/12/2018 at 7:51 PM  Between 7am to 6pm - Pager - [phone]  After 6pm go to [email or web address] - password [email or web address] [facility]  Office  [phone]  CC: Primary care physician; [place] Pink, MD   Note: This dictation was prepared with Dragon dictation along with smaller phrase technology. Any transcriptional errors that result from this process are unintentional.

## 2018-10-12 NOTE — Care Management Obs Status (Signed)
[place] NOTIFICATION   Patient Details  Name: Joyce Potter MRN: [id number] Date of Birth: Dec 13, 1946   Medicare Observation Status Notification Given:  Yes    Beverly Sessions, RN 10/12/2018, 1:56 PM

## 2018-10-12 NOTE — Discharge Instructions (Signed)
Keep using incentive spirometer 3-4 times every hour.

## 2018-10-19 DIAGNOSIS — S2242XD Multiple fractures of ribs, left side, subsequent encounter for fracture with routine healing: Secondary | ICD-10-CM | POA: Diagnosis not present

## 2018-10-22 NOTE — Progress Notes (Deleted)
[facility]  [phone] [phone]  ID: Kiandria R Greif OB: 10/18/1946  MR#: [id number]  CSN#:[id number]  Patient Care Team: Hedrick, James, MD as PCP - General (Family Medicine) Elliott, Robert T, MD (Gastroenterology) Finnegan, Timothy J, MD as Consulting Physician (Hematology and Oncology)  CHIEF COMPLAINT: Iron deficiency anemia, secondary to chronic blood loss.  INTERVAL HISTORY: Patient returns to clinic today for repeat laboratory work and further evaluation.  Her weakness and fatigue have improved, but are still evident.  She reports continued ongoing melena.  She otherwise feels well. She has no neurologic complaints.  She denies any recent fevers.  She denies any further chest pain, shortness of breath, cough, or hemoptysis.  She denies any nausea, vomiting, constipation, or diarrhea. She has no urinary complaints.  Patient offers no further specific complaints today.  REVIEW OF SYSTEMS:   Review of Systems  Constitutional: Positive for malaise/fatigue. Negative for diaphoresis, fever and weight loss.  Eyes: Negative.  Negative for blurred vision.  Respiratory: Negative for cough, hemoptysis and shortness of breath.   Cardiovascular: Negative.  Negative for chest pain and leg swelling.  Gastrointestinal: Positive for melena. Negative for abdominal pain and blood in stool.  Genitourinary: Negative.  Negative for hematuria.  Musculoskeletal: Negative.  Negative for joint pain.  Skin: Negative.  Negative for rash.  Neurological: Positive for weakness. Negative for dizziness, focal weakness and headaches.  Psychiatric/Behavioral: Negative.  The patient is not nervous/anxious.     As per HPI. Otherwise, a complete review of systems is negative.  PAST MEDICAL HISTORY: Past Medical History:  Diagnosis Date  . Anemia    iron deficiency  . Aortic stenosis   . Basal cell carcinoma   . CAD (coronary artery disease)    s/p Left circumflex  stent in 2012  . Carotid stenosis   . Cataract   . High cholesterol   . HTN (hypertension)   . Hyperlipidemia   . IDDM (insulin dependent diabetes mellitus) (HCC)   . Multiple thyroid nodules    Benign  . Peptic ulcer disease   . Retinal artery occlusion    on left    PAST SURGICAL HISTORY: Past Surgical History:  Procedure Laterality Date  . ARTERY BIOPSY Left 11/19/2015   Procedure: BIOPSY TEMPORAL ARTERY;  Surgeon: Jason S Dew, MD;  Location: [facility] ORS;  Service: Vascular;  Laterality: Left;  . BREAST BIOPSY Left 2002   core- neg  . CARDIAC CATHETERIZATION N/A 08/08/2015   Procedure: Right and Left Heart Cath and Coronary Angiography;  Surgeon: Kenneth A Fath, MD;  Location: [facility] INVASIVE CV LAB;  Service: Cardiovascular;  Laterality: N/A;  . CARDIAC CATHETERIZATION Bilateral 09/03/2016   Procedure: Right/Left Heart Cath and Coronary Angiography;  Surgeon: Kenneth A Fath, MD;  Location: [facility] INVASIVE CV LAB;  Service: Cardiovascular;  Laterality: Bilateral;  . CATARACT EXTRACTION W/ INTRAOCULAR LENS  IMPLANT, BILATERAL    . COLONOSCOPY     in 2013- internal hemorrhoids  . COLONOSCOPY WITH PROPOFOL N/A 07/07/2018   Procedure: COLONOSCOPY WITH PROPOFOL;  Surgeon: Elliott, Robert T, MD;  Location: [facility] ENDOSCOPY;  Service: Endoscopy;  Laterality: N/A;  . CORONARY ANGIOGRAPHY N/A 09/14/2017   Procedure: CORONARY ANGIOGRAPHY;  Surgeon: Fath, Kenneth A, MD;  Location: [facility] INVASIVE CV LAB;  Service: Cardiovascular;  Laterality: N/A;  . CORONARY ANGIOPLASTY    . CORONARY STENT PLACEMENT    . ENDARTERECTOMY Left 10/01/2015   Procedure: ENDARTERECTOMY CAROTID;  Surgeon: Jason S Dew, MD;  Location: [facility] ORS;    Service: Vascular;  Laterality: Left;  . ESOPHAGOGASTRODUODENOSCOPY    . ESOPHAGOGASTRODUODENOSCOPY (EGD) WITH PROPOFOL N/A 05/31/2016   Procedure: ESOPHAGOGASTRODUODENOSCOPY (EGD) WITH PROPOFOL;  Surgeon: Manya Silvas, MD;  Location: [facility] ENDOSCOPY;  Service: Endoscopy;  Laterality:  N/A;  . ESOPHAGOGASTRODUODENOSCOPY (EGD) WITH PROPOFOL N/A 07/07/2018   Procedure: ESOPHAGOGASTRODUODENOSCOPY (EGD) WITH PROPOFOL;  Surgeon: Manya Silvas, MD;  Location: [facility] ENDOSCOPY;  Service: Endoscopy;  Laterality: N/A;  . ESOPHAGOGASTRODUODENOSCOPY (EGD) WITH PROPOFOL N/A 07/17/2018   Procedure: ESOPHAGOGASTRODUODENOSCOPY (EGD) WITH PROPOFOL;  Surgeon: Lucilla Lame, MD;  Location: [facility] ENDOSCOPY;  Service: Endoscopy;  Laterality: N/A;  . EYE SURGERY    . GIVENS CAPSULE STUDY N/A 04/17/2013   Procedure: GIVENS CAPSULE STUDY;  Surgeon: Arta Silence, MD;  Location: [facility] ENDOSCOPY;  Service: Endoscopy;  Laterality: N/A;  patient ate breakfast at 7am   . PARTIAL HYSTERECTOMY    . RIGHT AND LEFT HEART CATH Bilateral 09/14/2017   Procedure: RIGHT AND LEFT HEART CATH;  Surgeon: Teodoro Spray, MD;  Location: [facility] CV LAB;  Service: Cardiovascular;  Laterality: Bilateral;  . TRIGGER FINGER RELEASE      FAMILY HISTORY Family History  Problem Relation Age of Onset  . Uterine cancer Mother   . Breast cancer Mother 32  . Seizures Father   . Stroke Father   . Diabetes Father   . COPD Father   . Colon cancer Neg Hx        ADVANCED DIRECTIVES:    HEALTH MAINTENANCE: Social History   Tobacco Use  . Smoking status: Former Smoker    Last attempt to quit: 02/15/1985    Years since quitting: 33.7  . Smokeless tobacco: Never Used  . Tobacco comment: quit about 31 years ago  Substance Use Topics  . Alcohol use: No    Alcohol/week: 0.0 standard drinks  . Drug use: No     Colonoscopy:  PAP:  Bone density:  Lipid panel:  Allergies  Allergen Reactions  . Aspirin Other (See Comments)    ulcers  . Invokamet [Canagliflozin-Metformin Hcl] Other (See Comments)    Yeast infection  . Sulfa Antibiotics Other (See Comments)    "Got Drunk"  . Lovastatin Rash  . Penicillins Rash    Has patient had a PCN reaction causing immediate rash, facial/tongue/throat swelling, SOB or  lightheadedness with hypotension:Yes Has patient had a PCN reaction causing severe rash involving mucus membranes or skin necrosis:all over body Has patient had a PCN reaction that required hospitalization: No Has patient had a PCN reaction occurring within the last 10 years: No If all of the above answers are "NO", then may proceed with Cephalosporin use.   . Vicodin [Hydrocodone-Acetaminophen] Rash    Current Outpatient Medications  Medication Sig Dispense Refill  . acetaminophen (TYLENOL) 325 MG tablet Take 2 tablets (650 mg total) by mouth every 6 (six) hours as needed for mild pain (or Fever >/= 101). 30 tablet 0  . albuterol (PROAIR HFA) 108 (90 Base) MCG/ACT inhaler Inhale 2 puffs into the lungs every 4 (four) hours as needed for wheezing or shortness of breath.     Marland Kitchen alendronate (FOSAMAX) 70 MG tablet Take 70 mg by mouth every Tuesday.     . bisacodyl (DULCOLAX) 5 MG EC tablet Take 1 tablet (5 mg total) by mouth daily as needed for moderate constipation. 30 tablet 0  . Emollient (CERAVE) CREA Apply 1 application topically daily as needed (for dry skin).     . insulin NPH-regular Human (NOVOLIN  70/30) (70-30) 100 UNIT/ML injection Inject 50-70 Units into the skin See admin instructions. 70 units every morning and 50 units every afternoon    . metoprolol tartrate (LOPRESSOR) 50 MG tablet Take 2 tablets (100 mg total) by mouth 2 (two) times daily. 60 tablet 0  . morphine (MS CONTIN) 15 MG 12 hr tablet Take 1 tablet (15 mg total) by mouth every 12 (twelve) hours. 20 tablet 0  . omeprazole (PRILOSEC) 40 MG capsule Take 40 mg by mouth 2 (two) times daily.     . pravastatin (PRAVACHOL) 10 MG tablet Take 1 tablet (10 mg total) by mouth daily at 6 PM. (Patient not taking: Reported on 10/11/2018) 30 tablet 0  . senna-docusate (SENOKOT-S) 8.6-50 MG tablet Take 1 tablet by mouth 2 (two) times daily. 20 tablet 0  . tiotropium (SPIRIVA HANDIHALER) 18 MCG inhalation capsule Place 18 mcg into inhaler  and inhale daily.      No current facility-administered medications for this visit.    Facility-Administered Medications Ordered in Other Visits  Medication Dose Route Frequency Provider Last Rate Last Dose  . heparin lock flush 100 unit/mL  500 Units Intravenous Once Allen, Lauren G, NP      . sodium chloride flush (NS) 0.9 % injection 10 mL  10 mL Intravenous Once Allen, Lauren G, NP        OBJECTIVE: There were no vitals filed for this visit.   There is no height or weight on file to calculate BMI.    ECOG FS:1 - Symptomatic but completely ambulatory  General: Well-developed, well-nourished, no acute distress. Eyes: Pink conjunctiva, anicteric sclera. HEENT: Normocephalic, moist mucous membranes. Lungs: Clear to auscultation bilaterally. Heart: Regular rate and rhythm. No rubs, murmurs, or gallops. Abdomen: Soft, nontender, nondistended. No organomegaly noted, normoactive bowel sounds. Musculoskeletal: No edema, cyanosis, or clubbing. Neuro: Alert, answering all questions appropriately. Cranial nerves grossly intact. Skin: No rashes or petechiae noted. Psych: Normal affect.  LAB RESULTS:  Lab Results  Component Value Date   NA 139 10/11/2018   K 4.7 10/11/2018   CL 103 10/11/2018   CO2 28 10/11/2018   GLUCOSE 246 (H) 10/11/2018   BUN 17 10/11/2018   CREATININE 0.78 10/11/2018   CALCIUM 9.3 10/11/2018   PROT 7.0 07/16/2018   ALBUMIN 3.8 07/16/2018   AST 22 07/16/2018   ALT 14 07/16/2018   ALKPHOS 65 07/16/2018   BILITOT 0.4 07/16/2018   GFRNONAA >60 10/11/2018   GFRAA >60 10/11/2018    Lab Results  Component Value Date   WBC 4.6 10/11/2018   NEUTROABS 3.0 10/11/2018   HGB 9.8 (L) 10/11/2018   HCT 32.0 (L) 10/11/2018   MCV 86.7 10/11/2018   PLT 265 10/11/2018   Lab Results  Component Value Date   IRON 20 (L) 08/22/2018   TIBC 262 08/22/2018   IRONPCTSAT 8 (L) 08/22/2018    Lab Results  Component Value Date   FERRITIN 39 08/22/2018     STUDIES: Dg  Chest 2 View  Result Date: 10/11/2018 CLINICAL DATA:  The patient fell out of the bathtub this morning with onset of severe left rib pain. EXAM: CHEST - 2 VIEW COMPARISON:  PA and lateral chest 07/16/2018. FINDINGS: There is cardiomegaly without edema. Trace left pleural effusion. No right effusion. No pneumothorax. Lungs are clear. The patient has acute fractures of the left sixth through eighth ribs. No other acute bony abnormality is identified. IMPRESSION: Acute fractures of left ribs, likely the sixth through eighth. Negative for   pneumothorax. Cardiomegaly without edema. Electronically Signed   By: Inge Rise M.D.   On: 10/11/2018 09:54   Ct Head Wo Contrast  Result Date: 10/11/2018 CLINICAL DATA:  The patient suffered a fall out of a bathtub today. Initial encounter. EXAM: CT HEAD WITHOUT CONTRAST TECHNIQUE: Contiguous axial images were obtained from the base of the skull through the vertex without intravenous contrast. COMPARISON:  Head CT 12/26/2017. FINDINGS: Brain: No evidence of acute infarction, hemorrhage, hydrocephalus, extra-axial collection or mass lesion/mass effect. Vascular: No hyperdense vessel or unexpected calcification. Skull: Intact.  No focal lesion. Sinuses/Orbits: Status post cataract surgery. Otherwise unremarkable. Other: None. IMPRESSION: Negative head CT. Electronically Signed   By: Inge Rise M.D.   On: 10/11/2018 10:00   Ct Cervical Spine Wo Contrast  Result Date: 10/12/2018 CLINICAL DATA:  [place] yesterday.  Negative head CT at that time. EXAM: CT CERVICAL SPINE WITHOUT CONTRAST TECHNIQUE: Multidetector CT imaging of the cervical spine was performed without intravenous contrast. Multiplanar CT image reconstructions were also generated. COMPARISON:  None. FINDINGS: Alignment: No traumatic malalignment. Some straightening of the normal cervical lordosis. Skull base and vertebrae: No fracture or primary bone lesion. Soft tissues and spinal canal: Negative Disc  levels: Degenerative spondylosis. No advanced disc space narrowing. Mild osteophytic encroachment upon the foramina bilaterally at C3-4 and C5-6. Facet osteoarthritis on the left at C3-4 and to a lesser extent at C4-5. Upper chest: Negative Other: None IMPRESSION: No acute or traumatic finding. Chronic degenerative spondylosis at C3-4 and C5-6. Facet arthropathy on the left at C3-4 more than C4-5. Electronically Signed   By: Nelson Chimes M.D.   On: 10/12/2018 18:51   Dg Shoulder Left  Result Date: 10/12/2018 CLINICAL DATA:  Left shoulder pain EXAM: LEFT SHOULDER - 2+ VIEW COMPARISON:  None. FINDINGS: There is no fracture or dislocation. There are mild degenerative changes of the acromioclavicular joint. IMPRESSION: No acute osseous injury of the left shoulder. Electronically Signed   By: Kathreen Devoid   On: 10/12/2018 12:55    ASSESSMENT: Iron deficiency anemia, secondary to chronic blood loss.  PLAN:    1. Iron deficiency anemia, secondary to chronic blood loss: Patient's hemoglobin remains decreased, but significantly improved.  She also reports continued active bleeding. Nuclear medicine bleeding scan completed on August 23, 2018 did not reveal any significant GI bleed.  Bone marrow biopsy completed on February 15, 2017 did not reveal any significant pathology.  Patient will require a repeat capsule endoscopy in the near future and has an appointment with [facility] in the near future.  She does not require transfusion today, but will proceed with 510 mg IV Feraheme.  Return to clinic in 2 weeks for repeat laboratory work and further evaluation.   2. Melena: Patient has an appointment with [facility] in the near future.  Her most recent colonoscopy was on July 07, 2018.  She had an upper endoscopy also on July 07, 2018 and then a repeat endoscopy on July 17, 2018.  Repeat capsule endoscopy in the near future. 3. Chest pain/shortness of breath: Resolved.  Likely heart strain secondary to severe anemia.  Continue  follow-up with cardiology as indicated.  I spent a total of 30 minutes face-to-face with the patient of which greater than 50% of the visit was spent in counseling and coordination of care as detailed above.   Patient expressed understanding and was in agreement with this plan. She also understands that She can call clinic at any time with any questions, concerns,  or complaints.    Lloyd Huger, MD 10/22/18 11:58 PM

## 2018-10-25 ENCOUNTER — Inpatient Hospital Stay: Payer: Medicare HMO | Admitting: Oncology

## 2018-10-25 ENCOUNTER — Inpatient Hospital Stay: Payer: Medicare HMO

## 2018-11-05 NOTE — Progress Notes (Signed)
[place]  [phone] Fax:([phone]) [phone]  ID: ZENDAYA GROSECLOSE OB: [birth year]  MR#: [id number]  [facility]#:[id number]  Patient Care Team: Maryland Pink, MD as PCP - General (Family Medicine) Manya Silvas, MD (Gastroenterology) Lloyd Huger, MD as Consulting Physician (Hematology and Oncology)  CHIEF COMPLAINT: Iron deficiency anemia, secondary to chronic blood loss.  INTERVAL HISTORY: Patient returns to clinic today for repeat laboratory work, further evaluation, consideration of additional IV Feraheme.  She is having significant flank pain after breaking several ribs from a fall in her bathroom last week.  She also reports continued GI bleed. She has no neurologic complaints.  She denies any recent fevers.  She denies any further chest pain, shortness of breath, cough, or hemoptysis.  She denies any nausea, vomiting, constipation, or diarrhea. She has no urinary complaints.  Patient offers no further specific complaints today.  REVIEW OF SYSTEMS:   Review of Systems  Constitutional: Positive for malaise/fatigue. Negative for diaphoresis, fever and weight loss.  Eyes: Negative.  Negative for blurred vision.  Respiratory: Negative for cough, hemoptysis and shortness of breath.   Cardiovascular: Negative.  Negative for chest pain and leg swelling.  Gastrointestinal: Positive for melena. Negative for abdominal pain and blood in stool.  Genitourinary: Positive for flank pain. Negative for hematuria.  Musculoskeletal: Positive for falls. Negative for joint pain.  Skin: Negative.  Negative for rash.  Neurological: Positive for weakness. Negative for dizziness, focal weakness and headaches.  Psychiatric/Behavioral: Negative.  The patient is not nervous/anxious.     As per HPI. Otherwise, a complete review of systems is negative.  PAST MEDICAL HISTORY: Past Medical History:  Diagnosis Date  . Anemia    iron deficiency  . Aortic stenosis   . Basal  cell carcinoma   . CAD (coronary artery disease)    s/p Left circumflex stent in 2012  . Carotid stenosis   . Cataract   . High cholesterol   . HTN (hypertension)   . Hyperlipidemia   . IDDM (insulin dependent diabetes mellitus) ([place])   . Multiple thyroid nodules    Benign  . Peptic ulcer disease   . Retinal artery occlusion    on left    PAST SURGICAL HISTORY: Past Surgical History:  Procedure Laterality Date  . ARTERY BIOPSY Left 11/19/2015   Procedure: BIOPSY TEMPORAL ARTERY;  Surgeon: Algernon Huxley, MD;  Location: [facility] ORS;  Service: Vascular;  Laterality: Left;  . BREAST BIOPSY Left 2002   core- neg  . CARDIAC CATHETERIZATION N/A 08/08/2015   Procedure: Right and Left Heart Cath and Coronary Angiography;  Surgeon: Teodoro Spray, MD;  Location: [place] [facility];  Service: Cardiovascular;  Laterality: N/A;  . CARDIAC CATHETERIZATION Bilateral 09/03/2016   Procedure: Right/Left Heart Cath and Coronary Angiography;  Surgeon: Teodoro Spray, MD;  Location: [place] [facility];  Service: Cardiovascular;  Laterality: Bilateral;  . CATARACT EXTRACTION W/ INTRAOCULAR LENS  IMPLANT, BILATERAL    . COLONOSCOPY     in 2013- internal hemorrhoids  . COLONOSCOPY WITH PROPOFOL N/A 07/07/2018   Procedure: COLONOSCOPY WITH PROPOFOL;  Surgeon: Manya Silvas, MD;  Location: [facility] END[facility]COPY;  Service: Endoscopy;  Laterality: N/A;  . CORONARY ANGIOGRAPHY N/A 09/14/2017   Procedure: CORONARY ANGIOGRAPHY;  Surgeon: Teodoro Spray, MD;  Location: [place] [facility];  Service: Cardiovascular;  Laterality: N/A;  . CORONARY ANGIOPLASTY    . CORONARY STENT PLACEMENT    . ENDARTERECTOMY Left 10/01/2015   Procedure: ENDARTERECTOMY  CAROTID;  Surgeon: Algernon Huxley, MD;  Location: [facility] ORS;  Service: Vascular;  Laterality: Left;  . ESOPHAGOGASTRODUODENOSCOPY    . ESOPHAGOGASTRODUODENOSCOPY (EGD) WITH PROPOFOL N/A 05/31/2016   Procedure: ESOPHAGOGASTRODUODENOSCOPY (EGD) WITH PROPOFOL;  Surgeon: Manya Silvas, MD;  Location[facility];  Service: Endoscopy;  Laterality: N/A;  . ESOPHAGOGASTRODUODENOSCOPY (EGD) WITH PROPOFOL N/A 07/07/2018   Procedure: ESOPHAGOGASTRODUODENOSCOPY (EGD) WITH PROPOFOL;  Surgeon: Manya Silvas, MD;  Location: [facility]COPY;  Service: Endoscopy;  Laterality: N/A;  . ESOPHAGOGASTRODUODENOSCOPY (EGD) WITH PROPOFOL N/A 07/17/2018   Procedure: ESOPHAGOGASTRODUODENOSCOPY (EGD) WITH PROPOFOL;  Surgeon: Lucilla Lame, MD;  Location: [facility] ENDOSCOPY;  Service: Endoscopy;  Laterality: N/A;  . EYE SURGERY    . GIVENS CAPSULE STUDY N/A 04/17/2013   Procedure: GIVENS CAPSULE STUDY;  Surgeon: Arta Silence, MD;  Location: [facility] ENDOSCOPY;  Service: Endoscopy;  Laterality: N/A;  patient ate breakfast at 7am   . PARTIAL HYSTERECTOMY    . RIGHT AND LEFT HEART CATH Bilateral 09/14/2017   Procedure: RIGHT AND LEFT HEART CATH;  Surgeon: Teodoro Spray, MD;  Location: [place] [facility];  Service: Cardiovascular;  Laterality: Bilateral;  . TRIGGER FINGER RELEASE      FAMILY HISTORY Family History  Problem Relation Age of Onset  . Uterine cancer Mother   . Breast cancer Mother 43  . Seizures Father   . Stroke Father   . Diabetes Father   . COPD Father   . Colon cancer Neg Hx        ADVANCED DIRECTIVES:    HEALTH MAINTENANCE: Social History   Tobacco Use  . Smoking status: Former Smoker    Last attempt to quit: 02/15/1985    Years since quitting: 33.7  . Smokeless tobacco: Never Used  . Tobacco comment: quit about 31 years ago  Substance Use Topics  . Alcohol use: No    Alcohol/week: 0.0 standard drinks  . Drug use: No     Colonoscopy:  PAP:  Bone density:  Lipid panel:  Allergies  Allergen Reactions  . Aspirin Other (See Comments)    ulcers  . Invokamet [Canagliflozin-Metformin Hcl] Other (See Comments)    Yeast infection  . Sulfa Antibiotics Other (See Comments)    "Got Drunk"  . Lovastatin Rash  . Penicillins Rash    Has patient had a PCN  reaction causing immediate rash, facial/tongue/throat swelling, SOB or lightheadedness with hypotension:Yes Has patient had a PCN reaction causing severe rash involving mucus membranes or skin necrosis:all over body Has patient had a PCN reaction that required hospitalization: No Has patient had a PCN reaction occurring within the last 10 years: No If all of the above answers are "NO", then may proceed with Cephalosporin use.   . Vicodin [Hydrocodone-Acetaminophen] Rash    Current Outpatient Medications  Medication Sig Dispense Refill  . acetaminophen (TYLENOL) 325 MG tablet Take 2 tablets (650 mg total) by mouth every 6 (six) hours as needed for mild pain (or Fever >/= 101). 30 tablet 0  . albuterol (PROAIR HFA) 108 (90 Base) MCG/ACT inhaler Inhale 2 puffs into the lungs every 4 (four) hours as needed for wheezing or shortness of breath.     Marland Kitchen alendronate (FOSAMAX) 70 MG tablet Take 70 mg by mouth every Tuesday.     . bisacodyl (DULCOLAX) 5 MG EC tablet Take 1 tablet (5 mg total) by mouth daily as needed for moderate constipation. 30 tablet 0  . Emollient (CERAVE) CREA Apply 1 application topically daily as needed (  for dry skin).     . insulin NPH-regular Human (NOVOLIN 70/30) (70-30) 100 UNIT/ML injection Inject 50-70 Units into the skin See admin instructions. 70 units every morning and 50 units every afternoon    . metoprolol tartrate (LOPRESSOR) 50 MG tablet Take 2 tablets (100 mg total) by mouth 2 (two) times daily. 60 tablet 0  . omeprazole (PRILOSEC) 40 MG capsule Take 40 mg by mouth 2 (two) times daily.     . pravastatin (PRAVACHOL) 10 MG tablet Take 1 tablet (10 mg total) by mouth daily at 6 PM. 30 tablet 0  . senna-docusate (SENOKOT-S) 8.6-50 MG tablet Take 1 tablet by mouth 2 (two) times daily. 20 tablet 0  . morphine (MS CONTIN) 15 MG 12 hr tablet Take 1 tablet (15 mg total) by mouth every 12 (twelve) hours. (Patient not taking: Reported on 11/08/2018) 20 tablet 0  . tiotropium  (SPIRIVA HANDIHALER) 18 MCG inhalation capsule Place 18 mcg into inhaler and inhale daily.      No current facility-administered medications for this visit.    Facility-Administered Medications Ordered in Other Visits  Medication Dose Route Frequency Provider Last Rate Last Dose  . heparin lock flush 100 unit/mL  500 Units Intravenous Once Verlon Au, NP      . sodium chloride flush (NS) 0.9 % injection 10 mL  10 mL Intravenous Once Verlon Au, NP        OBJECTIVE: Vitals:   11/08/18 0944  BP: (!) 167/92  Pulse: (!) 109  Resp: 20  Temp: (!) 97.5 F (36.4 C)  SpO2: 96%     Body mass index is 35 kg/m.    ECOG FS:1 - Symptomatic but completely ambulatory  General: Well-developed, well-nourished, no acute distress. Eyes: Pink conjunctiva, anicteric sclera. HEENT: Normocephalic, moist mucous membranes. Lungs: Clear to auscultation bilaterally. Heart: Regular rate and rhythm. No rubs, murmurs, or gallops. Abdomen: Soft, nontender, nondistended. No organomegaly noted, normoactive bowel sounds. Musculoskeletal: No edema, cyanosis, or clubbing. Neuro: Alert, answering all questions appropriately. Cranial nerves grossly intact. Skin: No rashes or petechiae noted. Psych: Normal affect.  LAB RESULTS:  Lab Results  Component Value Date   NA 139 10/11/2018   K 4.7 10/11/2018   CL 103 10/11/2018   CO2 28 10/11/2018   GLUCOSE 246 (H) 10/11/2018   BUN 17 10/11/2018   CREATININE 0.78 10/11/2018   CALCIUM 9.3 10/11/2018   PROT 7.0 07/16/2018   ALBUMIN 3.8 07/16/2018   AST 22 07/16/2018   ALT 14 07/16/2018   ALKPHOS 65 07/16/2018   BILITOT 0.4 07/16/2018   GFRNONAA >60 10/11/2018   GFRAA >60 10/11/2018    Lab Results  Component Value Date   WBC 4.4 11/08/2018   NEUTROABS 2.5 11/08/2018   HGB 10.4 (L) 11/08/2018   HCT 33.9 (L) 11/08/2018   MCV 80.3 11/08/2018   PLT 283 11/08/2018   Lab Results  Component Value Date   IRON 30 11/08/2018   TIBC 337 11/08/2018    IRONPCTSAT 9 (L) 11/08/2018    Lab Results  Component Value Date   FERRITIN 140 11/08/2018     STUDIES: Dg Chest 2 View  Result Date: 10/11/2018 CLINICAL DATA:  The patient fell out of the bathtub this morning with onset of severe left rib pain. EXAM: CHEST - 2 VIEW COMPARISON:  PA and lateral chest 07/16/2018. FINDINGS: There is cardiomegaly without edema. Trace left pleural effusion. No right effusion. No pneumothorax. Lungs are clear. The patient has acute fractures of  the left sixth through eighth ribs. No other acute bony abnormality is identified. IMPRESSION: Acute fractures of left ribs, likely the sixth through eighth. Negative for pneumothorax. Cardiomegaly without edema. Electronically Signed   By: Inge Rise M.D.   On: 10/11/2018 09:54   Ct Head Wo Contrast  Result Date: 10/11/2018 CLINICAL DATA:  The patient suffered a fall out of a bathtub today. Initial encounter. EXAM: CT HEAD WITHOUT CONTRAST TECHNIQUE: Contiguous axial images were obtained from the base of the skull through the vertex without intravenous contrast. COMPARISON:  Head CT 12/26/2017. FINDINGS: Brain: No evidence of acute infarction, hemorrhage, hydrocephalus, extra-axial collection or mass lesion/mass effect. Vascular: No hyperdense vessel or unexpected calcification. Skull: Intact.  No focal lesion. Sinuses/Orbits: Status post cataract surgery. Otherwise unremarkable. Other: None. IMPRESSION: Negative head CT. Electronically Signed   By: Inge Rise M.D.   On: 10/11/2018 10:00   Ct Cervical Spine Wo Contrast  Result Date: 10/12/2018 CLINICAL DATA:  [place] yesterday.  Negative head CT at that time. EXAM: CT CERVICAL SPINE WITHOUT CONTRAST TECHNIQUE: Multidetector CT imaging of the cervical spine was performed without intravenous contrast. Multiplanar CT image reconstructions were also generated. COMPARISON:  None. FINDINGS: Alignment: No traumatic malalignment. Some straightening of the normal cervical  lordosis. Skull base and vertebrae: No fracture or primary bone lesion. Soft tissues and spinal canal: Negative Disc levels: Degenerative spondylosis. No advanced disc space narrowing. Mild osteophytic encroachment upon the foramina bilaterally at C3-4 and C5-6. Facet osteoarthritis on the left at C3-4 and to a lesser extent at C4-5. Upper chest: Negative Other: None IMPRESSION: No acute or traumatic finding. Chronic degenerative spondylosis at C3-4 and C5-6. Facet arthropathy on the left at C3-4 more than C4-5. Electronically Signed   By: Nelson Chimes M.D.   On: 10/12/2018 18:51   Dg Shoulder Left  Result Date: 10/12/2018 CLINICAL DATA:  Left shoulder pain EXAM: LEFT SHOULDER - 2+ VIEW COMPARISON:  None. FINDINGS: There is no fracture or dislocation. There are mild degenerative changes of the acromioclavicular joint. IMPRESSION: No acute osseous injury of the left shoulder. Electronically Signed   By: Kathreen Devoid   On: 10/12/2018 12:55    ASSESSMENT: Iron deficiency anemia, secondary to chronic blood loss.  PLAN:    1. Iron deficiency anemia, secondary to chronic blood loss: Despite ongoing GI bleed, patient's hemoglobin has improved to 10.4.  Despite this her iron saturation is 9%, therefore we will proceed with one infusion of 510 mg IV Feraheme.  Nuclear medicine bleeding scan completed on August 23, 2018 did not reveal any significant GI bleed.  Bone marrow biopsy completed on February 15, 2017 did not reveal any significant pathology.  Patient will require a repeat capsule endoscopy in the near future and has an appointment with [facility] in the near future.  Return to clinic in 1 month for repeat laboratory work and further evaluation. 2. Melena: Patient has an appointment with [facility] in the near future.  Her most recent colonoscopy was on July 07, 2018.  She had an upper endoscopy also on July 07, 2018 and then a repeat endoscopy on July 17, 2018.  Repeat capsule endoscopy in the near future. 3.   Fractured ribs: Secondary to the fall.  Continue symptomatic treatment.  I spent a total of 30 minutes face-to-face with the patient of which greater than 50% of the visit was spent in counseling and coordination of care as detailed above.  Patient expressed understanding and was in agreement with this  plan. She also understands that She can call clinic at any time with any questions, concerns, or complaints.    Lloyd Huger, MD 11/09/18 6:40 AM

## 2018-11-08 ENCOUNTER — Inpatient Hospital Stay: Payer: Medicare HMO | Attending: Oncology

## 2018-11-08 ENCOUNTER — Inpatient Hospital Stay (HOSPITAL_BASED_OUTPATIENT_CLINIC_OR_DEPARTMENT_OTHER): Payer: Medicare HMO | Admitting: Oncology

## 2018-11-08 ENCOUNTER — Encounter: Payer: Self-pay | Admitting: Oncology

## 2018-11-08 ENCOUNTER — Inpatient Hospital Stay: Payer: Medicare HMO

## 2018-11-08 ENCOUNTER — Other Ambulatory Visit: Payer: Self-pay

## 2018-11-08 VITALS — BP 167/92 | HR 109 | Temp 97.5°F | Resp 20 | Wt 197.6 lb

## 2018-11-08 DIAGNOSIS — Z9181 History of falling: Secondary | ICD-10-CM

## 2018-11-08 DIAGNOSIS — K921 Melena: Secondary | ICD-10-CM | POA: Diagnosis not present

## 2018-11-08 DIAGNOSIS — Z87891 Personal history of nicotine dependence: Secondary | ICD-10-CM | POA: Insufficient documentation

## 2018-11-08 DIAGNOSIS — D500 Iron deficiency anemia secondary to blood loss (chronic): Secondary | ICD-10-CM | POA: Insufficient documentation

## 2018-11-08 DIAGNOSIS — D509 Iron deficiency anemia, unspecified: Secondary | ICD-10-CM

## 2018-11-08 LAB — CBC WITH DIFFERENTIAL/PLATELET
Abs Immature Granulocytes: 0.01 10*3/uL (ref 0.00–0.07)
BASOS ABS: 0.1 10*3/uL (ref 0.0–0.1)
Basophils Relative: 1 %
EOS ABS: 0.4 10*3/uL (ref 0.0–0.5)
Eosinophils Relative: 8 %
HEMATOCRIT: 33.9 % — AB (ref 36.0–46.0)
HEMOGLOBIN: 10.4 g/dL — AB (ref 12.0–15.0)
IMMATURE GRANULOCYTES: 0 %
LYMPHS ABS: 1.1 10*3/uL (ref 0.7–4.0)
LYMPHS PCT: 24 %
MCH: 24.6 pg — ABNORMAL LOW (ref 26.0–34.0)
MCHC: 30.7 g/dL (ref 30.0–36.0)
MCV: 80.3 fL (ref 80.0–100.0)
Monocytes Absolute: 0.5 10*3/uL (ref 0.1–1.0)
Monocytes Relative: 10 %
NEUTROS PCT: 57 %
NRBC: 0 % (ref 0.0–0.2)
Neutro Abs: 2.5 10*3/uL (ref 1.7–7.7)
Platelets: 283 10*3/uL (ref 150–400)
RBC: 4.22 MIL/uL (ref 3.87–5.11)
RDW: 15.2 % (ref 11.5–15.5)
WBC: 4.4 10*3/uL (ref 4.0–10.5)

## 2018-11-08 LAB — IRON AND TIBC
Iron: 30 ug/dL (ref 28–170)
Saturation Ratios: 9 % — ABNORMAL LOW (ref 10.4–31.8)
TIBC: 337 ug/dL (ref 250–450)
UIBC: 307 ug/dL

## 2018-11-08 LAB — SAMPLE TO BLOOD BANK

## 2018-11-08 LAB — FERRITIN: Ferritin: 140 ng/mL (ref 11–307)

## 2018-11-08 MED ORDER — SODIUM CHLORIDE 0.9 % IV SOLN
510.0000 mg | Freq: Once | INTRAVENOUS | Status: AC
  Administered 2018-11-08: 510 mg via INTRAVENOUS
  Filled 2018-11-08: qty 17

## 2018-11-08 MED ORDER — SODIUM CHLORIDE 0.9 % IV SOLN
INTRAVENOUS | Status: DC
  Administered 2018-11-08: 11:00:00 via INTRAVENOUS
  Filled 2018-11-08: qty 250

## 2018-11-08 NOTE — Progress Notes (Signed)
Pt in for follow up, reports fell 2 weeks ago and broke ribs 6,7,&8.  Pt reports "bleeding from backside, filling a pad last week".

## 2018-11-15 DIAGNOSIS — R809 Proteinuria, unspecified: Secondary | ICD-10-CM | POA: Diagnosis not present

## 2018-11-15 DIAGNOSIS — M810 Age-related osteoporosis without current pathological fracture: Secondary | ICD-10-CM | POA: Diagnosis not present

## 2018-11-15 DIAGNOSIS — E1142 Type 2 diabetes mellitus with diabetic polyneuropathy: Secondary | ICD-10-CM | POA: Diagnosis not present

## 2018-11-15 DIAGNOSIS — E1129 Type 2 diabetes mellitus with other diabetic kidney complication: Secondary | ICD-10-CM | POA: Diagnosis not present

## 2018-11-15 DIAGNOSIS — Z794 Long term (current) use of insulin: Secondary | ICD-10-CM | POA: Diagnosis not present

## 2018-11-22 DIAGNOSIS — R1312 Dysphagia, oropharyngeal phase: Secondary | ICD-10-CM | POA: Diagnosis not present

## 2018-11-22 DIAGNOSIS — I10 Essential (primary) hypertension: Secondary | ICD-10-CM | POA: Diagnosis not present

## 2018-11-22 DIAGNOSIS — E1159 Type 2 diabetes mellitus with other circulatory complications: Secondary | ICD-10-CM | POA: Diagnosis not present

## 2018-11-22 DIAGNOSIS — E1142 Type 2 diabetes mellitus with diabetic polyneuropathy: Secondary | ICD-10-CM | POA: Diagnosis not present

## 2018-11-22 DIAGNOSIS — M810 Age-related osteoporosis without current pathological fracture: Secondary | ICD-10-CM | POA: Diagnosis not present

## 2018-11-22 DIAGNOSIS — E042 Nontoxic multinodular goiter: Secondary | ICD-10-CM | POA: Diagnosis not present

## 2018-11-22 DIAGNOSIS — E1129 Type 2 diabetes mellitus with other diabetic kidney complication: Secondary | ICD-10-CM | POA: Diagnosis not present

## 2018-11-22 DIAGNOSIS — E559 Vitamin D deficiency, unspecified: Secondary | ICD-10-CM | POA: Diagnosis not present

## 2018-11-22 DIAGNOSIS — R809 Proteinuria, unspecified: Secondary | ICD-10-CM | POA: Diagnosis not present

## 2018-11-22 DIAGNOSIS — E1165 Type 2 diabetes mellitus with hyperglycemia: Secondary | ICD-10-CM | POA: Diagnosis not present

## 2018-11-29 ENCOUNTER — Other Ambulatory Visit: Payer: Self-pay | Admitting: Internal Medicine

## 2018-11-29 DIAGNOSIS — R1312 Dysphagia, oropharyngeal phase: Secondary | ICD-10-CM

## 2018-11-29 DIAGNOSIS — E042 Nontoxic multinodular goiter: Secondary | ICD-10-CM

## 2018-12-03 NOTE — Progress Notes (Signed)
[place]  [phone] [phone]  ID: Joyce Potter OB: Apr 29, 1946  MR#: [id number]  WNI#:[id number]  Patient Care Team: [place] Pink, MD as PCP - General (Family Medicine) Manya Silvas, MD (Gastroenterology) Lloyd Huger, MD as Consulting Physician (Hematology and Oncology)  CHIEF COMPLAINT: Iron deficiency anemia, secondary to chronic blood loss.  INTERVAL HISTORY: Patient returns to clinic today for repeat laboratory work, further evaluation, consideration of additional IV Feraheme. She continues to have pain associated with prior fall and rib fracture. She has not suffered any additional bleeding including GI bleed. She has dizziness and falls previously but reports this is no better with feraheme and improvement in blood counts. She denies recent fevers. She denies chest pain, shortness of breath, cough or hemoptysis. She denies any nausea, vomiting, constipation, or diarrhea. She has no urinary complaints.  Patient offers no further specific complaints today.  REVIEW OF SYSTEMS:   Review of Systems  Constitutional: Positive for malaise/fatigue. Negative for diaphoresis, fever and weight loss.  Eyes: Negative.  Negative for blurred vision.  Respiratory: Negative for cough, hemoptysis and shortness of breath.   Cardiovascular: Negative.  Negative for chest pain and leg swelling.  Gastrointestinal: Negative for abdominal pain, blood in stool and melena.  Genitourinary: Positive for flank pain (rib fracture). Negative for hematuria.  Musculoskeletal: Positive for falls. Negative for joint pain.  Skin: Negative.  Negative for rash.  Neurological: Positive for dizziness (intermittent) and weakness. Negative for focal weakness and headaches.  Psychiatric/Behavioral: Negative.  The patient is not nervous/anxious.    As per HPI. Otherwise, a complete review of systems is negative.  PAST MEDICAL HISTORY: Past Medical History:    Diagnosis Date  . Anemia    iron deficiency  . Aortic stenosis   . Basal cell carcinoma   . CAD (coronary artery disease)    s/p Left circumflex stent in 2012  . Carotid stenosis   . Cataract   . High cholesterol   . HTN (hypertension)   . Hyperlipidemia   . IDDM (insulin dependent diabetes mellitus) ([place])   . Multiple thyroid nodules    Benign  . Peptic ulcer disease   . Retinal artery occlusion    on left    PAST SURGICAL HISTORY: Past Surgical History:  Procedure Laterality Date  . ARTERY BIOPSY Left 11/19/2015   Procedure: BIOPSY TEMPORAL ARTERY;  Surgeon: Algernon Huxley, MD;  Location: [facility] ORS;  Service: Vascular;  Laterality: Left;  . BREAST BIOPSY Left 2002   core- neg  . CARDIAC CATHETERIZATION N/A 08/08/2015   Procedure: Right and Left Heart Cath and Coronary Angiography;  Surgeon: Teodoro Spray, MD;  Location: [place] CV LAB;  Service: Cardiovascular;  Laterality: N/A;  . CARDIAC CATHETERIZATION Bilateral 09/03/2016   Procedure: Right/Left Heart Cath and Coronary Angiography;  Surgeon: Teodoro Spray, MD;  Location: [place] CV LAB;  Service: Cardiovascular;  Laterality: Bilateral;  . CATARACT EXTRACTION W/ INTRAOCULAR LENS  IMPLANT, BILATERAL    . COLONOSCOPY     in 2013- internal hemorrhoids  . COLONOSCOPY WITH PROPOFOL N/A 07/07/2018   Procedure: COLONOSCOPY WITH PROPOFOL;  Surgeon: Manya Silvas, MD;  Location: [facility] ENDOSCOPY;  Service: Endoscopy;  Laterality: N/A;  . CORONARY ANGIOGRAPHY N/A 09/14/2017   Procedure: CORONARY ANGIOGRAPHY;  Surgeon: Teodoro Spray, MD;  Location: [place] CV LAB;  Service: Cardiovascular;  Laterality: N/A;  . CORONARY ANGIOPLASTY    . CORONARY STENT PLACEMENT    . ENDARTERECTOMY  Left 10/01/2015   Procedure: ENDARTERECTOMY CAROTID;  Surgeon: Algernon Huxley, MD;  Location: [facility] ORS;  Service: Vascular;  Laterality: Left;  . ESOPHAGOGASTRODUODENOSCOPY    . ESOPHAGOGASTRODUODENOSCOPY (EGD) WITH PROPOFOL N/A 05/31/2016    Procedure: ESOPHAGOGASTRODUODENOSCOPY (EGD) WITH PROPOFOL;  Surgeon: Manya Silvas, MD;  Location: [facility] ENDOSCOPY;  Service: Endoscopy;  Laterality: N/A;  . ESOPHAGOGASTRODUODENOSCOPY (EGD) WITH PROPOFOL N/A 07/07/2018   Procedure: ESOPHAGOGASTRODUODENOSCOPY (EGD) WITH PROPOFOL;  Surgeon: Manya Silvas, MD;  Location: [facility] ENDOSCOPY;  Service: Endoscopy;  Laterality: N/A;  . ESOPHAGOGASTRODUODENOSCOPY (EGD) WITH PROPOFOL N/A 07/17/2018   Procedure: ESOPHAGOGASTRODUODENOSCOPY (EGD) WITH PROPOFOL;  Surgeon: Lucilla Lame, MD;  Location: [facility] ENDOSCOPY;  Service: Endoscopy;  Laterality: N/A;  . EYE SURGERY    . GIVENS CAPSULE STUDY N/A 04/17/2013   Procedure: GIVENS CAPSULE STUDY;  Surgeon: Arta Silence, MD;  Location: [facility] ENDOSCOPY;  Service: Endoscopy;  Laterality: N/A;  patient ate breakfast at 7am   . PARTIAL HYSTERECTOMY    . RIGHT AND LEFT HEART CATH Bilateral 09/14/2017   Procedure: RIGHT AND LEFT HEART CATH;  Surgeon: Teodoro Spray, MD;  Location: [place] CV LAB;  Service: Cardiovascular;  Laterality: Bilateral;  . TRIGGER FINGER RELEASE      FAMILY HISTORY Family History  Problem Relation Age of Onset  . Uterine cancer Mother   . Breast cancer Mother 16  . Seizures Father   . Stroke Father   . Diabetes Father   . COPD Father   . Colon cancer Neg Hx       ADVANCED DIRECTIVES:    HEALTH MAINTENANCE: Social History   Tobacco Use  . Smoking status: Former Smoker    Last attempt to quit: 02/15/1985    Years since quitting: 33.8  . Smokeless tobacco: Never Used  . Tobacco comment: quit about 31 years ago  Substance Use Topics  . Alcohol use: No    Alcohol/week: 0.0 standard drinks  . Drug use: No    Colonoscopy:  PAP:  Bone density: Osteoporosis- DEXA 06/2017 (on fosamax)  Lipid panel:  Allergies  Allergen Reactions  . Aspirin Other (See Comments)    ulcers  . Invokamet [Canagliflozin-Metformin Hcl] Other (See Comments)    Yeast infection  . Sulfa  Antibiotics Other (See Comments)    "Got Drunk"  . Lovastatin Rash  . Penicillins Rash    Has patient had a PCN reaction causing immediate rash, facial/tongue/throat swelling, SOB or lightheadedness with hypotension:Yes Has patient had a PCN reaction causing severe rash involving mucus membranes or skin necrosis:all over body Has patient had a PCN reaction that required hospitalization: No Has patient had a PCN reaction occurring within the last 10 years: No If all of the above answers are "NO", then may proceed with Cephalosporin use.   . Vicodin [Hydrocodone-Acetaminophen] Rash    Current Outpatient Medications  Medication Sig Dispense Refill  . acetaminophen (TYLENOL) 325 MG tablet Take 2 tablets (650 mg total) by mouth every 6 (six) hours as needed for mild pain (or Fever >/= 101). 30 tablet 0  . albuterol (PROAIR HFA) 108 (90 Base) MCG/ACT inhaler Inhale 2 puffs into the lungs every 4 (four) hours as needed for wheezing or shortness of breath.     Marland Kitchen alendronate (FOSAMAX) 70 MG tablet Take 70 mg by mouth every Tuesday.     . bisacodyl (DULCOLAX) 5 MG EC tablet Take 1 tablet (5 mg total) by mouth daily as needed for moderate constipation. 30 tablet 0  . Emollient (  CERAVE) CREA Apply 1 application topically daily as needed (for dry skin).     . insulin NPH-regular Human (NOVOLIN 70/30) (70-30) 100 UNIT/ML injection Inject 60-70 Units into the skin See admin instructions. 80 units every morning and 60 units every afternoon    . metoprolol tartrate (LOPRESSOR) 50 MG tablet Take 2 tablets (100 mg total) by mouth 2 (two) times daily. 60 tablet 0  . omeprazole (PRILOSEC) 40 MG capsule Take 40 mg by mouth 2 (two) times daily.     . pravastatin (PRAVACHOL) 10 MG tablet Take 1 tablet (10 mg total) by mouth daily at 6 PM. 30 tablet 0  . senna-docusate (SENOKOT-S) 8.6-50 MG tablet Take 1 tablet by mouth 2 (two) times daily. 20 tablet 0  . tiotropium (SPIRIVA HANDIHALER) 18 MCG inhalation capsule  Place 18 mcg into inhaler and inhale daily.      No current facility-administered medications for this visit.    Facility-Administered Medications Ordered in Other Visits  Medication Dose Route Frequency Provider Last Rate Last Dose  . heparin lock flush 100 unit/mL  500 Units Intravenous Once Verlon Au, NP      . sodium chloride flush (NS) 0.9 % injection 10 mL  10 mL Intravenous Once Verlon Au, NP        OBJECTIVE: Vitals:   12/07/18 0906  BP: 140/86  Pulse: 78  Temp: (!) 97.3 F (36.3 C)     Body mass index is 35.73 kg/m.    ECOG FS:1 - Symptomatic but completely ambulatory  General: Well-developed, well-nourished, no acute distress. Eyes: Pink conjunctiva, anicteric sclera. HEENT: normocephalic, moist mucous membranes Lungs: Clear to auscultation bilaterally. Heart: Regular rate and rhythm. No rubs, murmurs, or gallops. Abdomen: Soft, nontender, nondistended. No organomegaly noted, normoactive bowel sounds. Musculoskeletal: No edema, cyanosis, or clubbing. Neuro: Alert, answering all questions appropriately. Cranial nerves grossly intact. Skin: No rashes or petechiae noted. Psych: Normal affect.  LAB RESULTS:  Lab Results  Component Value Date   NA 139 10/11/2018   K 4.7 10/11/2018   CL 103 10/11/2018   CO2 28 10/11/2018   GLUCOSE 246 (H) 10/11/2018   BUN 17 10/11/2018   CREATININE 0.78 10/11/2018   CALCIUM 9.3 10/11/2018   PROT 7.0 07/16/2018   ALBUMIN 3.8 07/16/2018   AST 22 07/16/2018   ALT 14 07/16/2018   ALKPHOS 65 07/16/2018   BILITOT 0.4 07/16/2018   GFRNONAA >60 10/11/2018   GFRAA >60 10/11/2018    Lab Results  Component Value Date   WBC 5.0 12/07/2018   NEUTROABS 3.2 12/07/2018   HGB 11.0 (L) 12/07/2018   HCT 35.9 (L) 12/07/2018   MCV 84.3 12/07/2018   PLT 211 12/07/2018   Lab Results  Component Value Date   IRON 48 12/07/2018   TIBC 244 (L) 12/07/2018   IRONPCTSAT 20 12/07/2018    Lab Results  Component Value Date    FERRITIN 96 12/07/2018     STUDIES: No results found.  ASSESSMENT: Iron deficiency anemia, secondary to chronic blood loss.  PLAN:    1. Iron deficiency anemia, secondary to chronic blood loss-nuclear medicine bleeding scan completed on 08/23/2018 did not reveal any significant GI bleed.  Bone marrow biopsy on 02/15/2017 did not reveal any significant pathology.  Again discussed second opinion with [facility].  Today, hemoglobin has improved to 11.9.  Iron saturation has improved to 20%.  Last received IV Feraheme 510 mg on 11/08/2018.  Given improvement in labs, no melena, and lack of  improvement in symptoms, will hold IV Feraheme today.  Return to clinic in 2 months for repeat labs and consideration of blood or feraheme with Dr. Grayland Ormond.   2. Melena: Currently resolved.  Extensive work-up previously including colonoscopy on 07/07/2018, upper endoscopy on 07/07/2018 and repeat endoscopy on 07/17/2018 were overall normal without evidence of bleed.  Discussed evaluation at [facility] again today for further evaluation of this chronic and ongoing problem.  3.  Fractured ribs: Secondary to the fall in October 2019.  Patient requesting tramadol today for pain and sleep.  Discussed risk versus benefit including possible worsening of dizziness with tramadol and I advised that I would not recommend narcotic pain medicine for pain at this point.  Continue Tylenol and heat/cold therapy, topical analgesics. Follow up with PCP.   4. Dizziness- chronic.  Has not improved with IV iron, blood transfusions, or resolution of melena and improvement in lab.  Encourage patient to discuss with PCP and consider neurologic evaluation.  A total of (25) minutes of face-to-face time was spent with this patient with greater than 50% of that time in counseling and care-coordination.  Patient expressed understanding and was in agreement with this plan. She also understands that She can call clinic at any time with any questions,  concerns, or complaints.    Verlon Au, NP 12/07/18 3:38 PM

## 2018-12-05 ENCOUNTER — Other Ambulatory Visit: Payer: Self-pay | Admitting: Family Medicine

## 2018-12-05 DIAGNOSIS — Z1231 Encounter for screening mammogram for malignant neoplasm of breast: Secondary | ICD-10-CM

## 2018-12-07 ENCOUNTER — Inpatient Hospital Stay (HOSPITAL_BASED_OUTPATIENT_CLINIC_OR_DEPARTMENT_OTHER): Payer: Medicare HMO | Admitting: Oncology

## 2018-12-07 ENCOUNTER — Inpatient Hospital Stay: Payer: Medicare HMO | Attending: Oncology

## 2018-12-07 ENCOUNTER — Other Ambulatory Visit: Payer: Self-pay

## 2018-12-07 ENCOUNTER — Inpatient Hospital Stay: Payer: Medicare HMO

## 2018-12-07 VITALS — BP 140/86 | HR 78 | Temp 97.3°F | Ht 63.0 in | Wt 201.7 lb

## 2018-12-07 DIAGNOSIS — R296 Repeated falls: Secondary | ICD-10-CM | POA: Insufficient documentation

## 2018-12-07 DIAGNOSIS — S2239XS Fracture of one rib, unspecified side, sequela: Secondary | ICD-10-CM

## 2018-12-07 DIAGNOSIS — D500 Iron deficiency anemia secondary to blood loss (chronic): Secondary | ICD-10-CM

## 2018-12-07 DIAGNOSIS — Z87891 Personal history of nicotine dependence: Secondary | ICD-10-CM | POA: Diagnosis not present

## 2018-12-07 DIAGNOSIS — M810 Age-related osteoporosis without current pathological fracture: Secondary | ICD-10-CM | POA: Insufficient documentation

## 2018-12-07 DIAGNOSIS — D509 Iron deficiency anemia, unspecified: Secondary | ICD-10-CM

## 2018-12-07 DIAGNOSIS — R42 Dizziness and giddiness: Secondary | ICD-10-CM | POA: Insufficient documentation

## 2018-12-07 LAB — CBC WITH DIFFERENTIAL/PLATELET
ABS IMMATURE GRANULOCYTES: 0.01 10*3/uL (ref 0.00–0.07)
Basophils Absolute: 0 10*3/uL (ref 0.0–0.1)
Basophils Relative: 1 %
EOS ABS: 0.3 10*3/uL (ref 0.0–0.5)
Eosinophils Relative: 6 %
HCT: 35.9 % — ABNORMAL LOW (ref 36.0–46.0)
HEMOGLOBIN: 11 g/dL — AB (ref 12.0–15.0)
IMMATURE GRANULOCYTES: 0 %
LYMPHS PCT: 20 %
Lymphs Abs: 1 10*3/uL (ref 0.7–4.0)
MCH: 25.8 pg — AB (ref 26.0–34.0)
MCHC: 30.6 g/dL (ref 30.0–36.0)
MCV: 84.3 fL (ref 80.0–100.0)
Monocytes Absolute: 0.5 10*3/uL (ref 0.1–1.0)
Monocytes Relative: 10 %
NEUTROS ABS: 3.2 10*3/uL (ref 1.7–7.7)
NEUTROS PCT: 63 %
NRBC: 0 % (ref 0.0–0.2)
Platelets: 211 10*3/uL (ref 150–400)
RBC: 4.26 MIL/uL (ref 3.87–5.11)
RDW: 17.3 % — AB (ref 11.5–15.5)
WBC: 5 10*3/uL (ref 4.0–10.5)

## 2018-12-07 LAB — IRON AND TIBC
Iron: 48 ug/dL (ref 28–170)
SATURATION RATIOS: 20 % (ref 10.4–31.8)
TIBC: 244 ug/dL — ABNORMAL LOW (ref 250–450)
UIBC: 196 ug/dL

## 2018-12-07 LAB — FERRITIN: FERRITIN: 96 ng/mL (ref 11–307)

## 2018-12-07 NOTE — Progress Notes (Signed)
Patient is here today for follow up on her anemia. Patient stated that she had fallen twice since we saw her last. Patient stated that she gets dizzy throughout the day. Patient also stated that she stays tired and weak. Patient also stated that it is hard for her to sleep at night and she was requesting Tramadol for pain and to help her sleep at night.

## 2018-12-08 ENCOUNTER — Encounter: Payer: Self-pay | Admitting: Oncology

## 2018-12-18 ENCOUNTER — Ambulatory Visit
Admission: RE | Admit: 2018-12-18 | Discharge: 2018-12-18 | Disposition: A | Discharge status: Home / Self Care | Payer: Medicare HMO | Source: Ambulatory Visit | Attending: Internal Medicine | Admitting: Internal Medicine

## 2018-12-18 DIAGNOSIS — E1310 Other specified diabetes mellitus with ketoacidosis without coma: Secondary | ICD-10-CM | POA: Diagnosis not present

## 2018-12-18 DIAGNOSIS — R1313 Dysphagia, pharyngeal phase: Secondary | ICD-10-CM | POA: Diagnosis not present

## 2018-12-18 DIAGNOSIS — E042 Nontoxic multinodular goiter: Secondary | ICD-10-CM | POA: Diagnosis not present

## 2018-12-18 DIAGNOSIS — R1310 Dysphagia, unspecified: Secondary | ICD-10-CM | POA: Diagnosis not present

## 2018-12-18 NOTE — Therapy (Signed)
[place], [place], [address] Phone: [phone]   Fax:     Modified Barium Swallow  Patient Details  Name: Joyce Potter MRN: [id number] Date of Birth: 1946-10-31 No data recorded  Encounter Date: 12/18/2018  End of Session - 12/18/18 1336    Visit Number  1    Number of Visits  1    Date for SLP Re-Evaluation  12/18/18       Past Medical History:  Diagnosis Date  . Anemia    iron deficiency  . Aortic stenosis   . Basal cell carcinoma   . CAD (coronary artery disease)    s/p Left circumflex stent in 2012  . Carotid stenosis   . Cataract   . High cholesterol   . HTN (hypertension)   . Hyperlipidemia   . IDDM (insulin dependent diabetes mellitus) ([place])   . Multiple thyroid nodules    Benign  . Peptic ulcer disease   . Retinal artery occlusion    on left    Past Surgical History:  Procedure Laterality Date  . ARTERY BIOPSY Left 11/19/2015   Procedure: BIOPSY TEMPORAL ARTERY;  Surgeon: Algernon Huxley, MD;  Location: [facility] ORS;  Service: Vascular;  Laterality: Left;  . BREAST BIOPSY Left 2002   core- neg  . CARDIAC CATHETERIZATION N/A 08/08/2015   Procedure: Right and Left Heart Cath and Coronary Angiography;  Surgeon: Teodoro Spray, MD;  Location: [place] CV LAB;  Service: Cardiovascular;  Laterality: N/A;  . CARDIAC CATHETERIZATION Bilateral 09/03/2016   Procedure: Right/Left Heart Cath and Coronary Angiography;  Surgeon: Teodoro Spray, MD;  Location: [place] CV LAB;  Service: Cardiovascular;  Laterality: Bilateral;  . CATARACT EXTRACTION W/ INTRAOCULAR LENS  IMPLANT, BILATERAL    . COLONOSCOPY     in 2013- internal hemorrhoids  . COLONOSCOPY WITH PROPOFOL N/A 07/07/2018   Procedure: COLONOSCOPY WITH PROPOFOL;  Surgeon: Manya Silvas, MD;  Location: [facility] ENDOSCOPY;  Service: Endoscopy;  Laterality: N/A;  . CORONARY ANGIOGRAPHY N/A 09/14/2017   Procedure: CORONARY  ANGIOGRAPHY;  Surgeon: Teodoro Spray, MD;  Location: [place] CV LAB;  Service: Cardiovascular;  Laterality: N/A;  . CORONARY ANGIOPLASTY    . CORONARY STENT PLACEMENT    . ENDARTERECTOMY Left 10/01/2015   Procedure: ENDARTERECTOMY CAROTID;  Surgeon: Algernon Huxley, MD;  Location: [facility] ORS;  Service: Vascular;  Laterality: Left;  . ESOPHAGOGASTRODUODENOSCOPY    . ESOPHAGOGASTRODUODENOSCOPY (EGD) WITH PROPOFOL N/A 05/31/2016   Procedure: ESOPHAGOGASTRODUODENOSCOPY (EGD) WITH PROPOFOL;  Surgeon: Manya Silvas, MD;  Location: [facility] ENDOSCOPY;  Service: Endoscopy;  Laterality: N/A;  . ESOPHAGOGASTRODUODENOSCOPY (EGD) WITH PROPOFOL N/A 07/07/2018   Procedure: ESOPHAGOGASTRODUODENOSCOPY (EGD) WITH PROPOFOL;  Surgeon: Manya Silvas, MD;  Location: [facility] ENDOSCOPY;  Service: Endoscopy;  Laterality: N/A;  . ESOPHAGOGASTRODUODENOSCOPY (EGD) WITH PROPOFOL N/A 07/17/2018   Procedure: ESOPHAGOGASTRODUODENOSCOPY (EGD) WITH PROPOFOL;  Surgeon: Lucilla Lame, MD;  Location: [facility] ENDOSCOPY;  Service: Endoscopy;  Laterality: N/A;  . EYE SURGERY    . GIVENS CAPSULE STUDY N/A 04/17/2013   Procedure: GIVENS CAPSULE STUDY;  Surgeon: Arta Silence, MD;  Location: [facility] ENDOSCOPY;  Service: Endoscopy;  Laterality: N/A;  patient ate breakfast at 7am   . PARTIAL HYSTERECTOMY    . RIGHT AND LEFT HEART CATH Bilateral 09/14/2017   Procedure: RIGHT AND LEFT HEART CATH;  Surgeon: Teodoro Spray, MD;  Location: [place] CV LAB;  Service: Cardiovascular;  Laterality: Bilateral;  .  TRIGGER FINGER RELEASE      There were no vitals filed for this visit.   Subjective: Patient behavior: (alertness, ability to follow instructions, etc.):  The patient is alert, able to express her swallowing complaints, and follow directions  Chief complaint:  Patient reports choking on her own saliva   Objective:  Radiological Procedure: A videoflouroscopic evaluation of oral-preparatory, reflex initiation, and pharyngeal phases of the  swallow was performed; as well as a screening of the upper esophageal phase.  I. POSTURE: Upright in MBS chair  II. VIEW: Lateral  III. COMPENSATORY STRATEGIES: N/A  IV. BOLUSES ADMINISTERED:   Thin Liquid: 2 moderate   Nectar-thick Liquid: 1 moderate   Honey-thick Liquid: DNT   Puree: 2 teaspoon presentations   Mechanical Soft: 1/4 graham cracker in apple sauce  V. RESULTS OF EVALUATION: A. ORAL PREPARATORY PHASE: (The lips, tongue, and velum are observed for strength and coordination)       **Overall Severity Rating: within normal limits   B. SWALLOW INITIATION/REFLEX: (The reflex is normal if "triggered" by the time the bolus reached the base of the tongue)  **Overall Severity Rating: within normal limits   C. PHARYNGEAL PHASE: (Pharyngeal function is normal if the bolus shows rapid, smooth, and continuous transit through the pharynx and there is no pharyngeal residue after the swallow)  **Overall Severity Rating: within normal limits   D. LARYNGEAL PENETRATION: (Material entering into the laryngeal inlet/vestibule but not aspirated) NONE  E. ASPIRATION: NONE  F. ESOPHAGEAL PHASE: (Screening of the upper esophagus): No overt abnormality within the viewable cervical esophagus  ASSESSMENT: This 72 year old woman; with multinodular goiter and occasional choking; is presenting with normal oropharyngeal swallow.  Oral control of the bolus including oral hold, rotary mastication, and anterior to posterior transfer is within normal limits.   Aspects of the pharyngeal stage of swallowing including tongue base retraction, hyolaryngeal excursion, epiglottic inversion, and duration/amplitude of UES opening are within normal limits.  There is no observed pharyngeal residue, laryngeal penetration, or tracheal aspiration.  The patient is not at significant risk for prandial aspiration and her complaints do not appear to be due to oropharyngeal swallow function.    PLAN/RECOMMENDATIONS:     A. Diet: Regular (soften and moisten as needed for comfort)   B. Swallowing Precautions: No special precautions indicated   C. Recommended consultation to: follow up with MDs as recommended   D. Therapy recommendations: speech therapy is not indicated   E. Results and recommendations were discussed with the patient immediately following the study and the final report routed to the referring MD.   Dysphagia, unspecified type - Plan: DG OP Swallowing Func-Medicare/Speech Path, DG OP Swallowing Func-Medicare/Speech Path  Multinodular thyroid - Plan: DG OP Swallowing Func-Medicare/Speech Path, DG OP Swallowing Func-Medicare/Speech Path        Problem List Patient Active Problem List   Diagnosis Date Noted  . Multiple rib fractures 10/11/2018  . GIB (gastrointestinal bleeding) 07/17/2018  . Melena   . Symptomatic anemia   . PVD (peripheral vascular disease) ([place]) 02/11/2017  . Iron deficiency anemia due to chronic blood loss 07/13/2016  . Central retinal artery occlusion 10/01/2015  . Carotid stenosis 10/01/2015  . Stroke ([place]) 09/28/2015  . GI bleed from an occult source with recurrent chronic blood loss anemia 04/14/2013  . CAD (coronary artery disease) with demand ischemia from anemia 04/14/2013  . IDDM (insulin dependent diabetes mellitus) ([place]) 04/14/2013  . HTN (hypertension) 04/14/2013  . Chest pain 04/14/2013   Margarite Gouge  Valetta Fuller, MS/CCC- SLP  Lou Miner 12/18/2018, 1:36 PM  [facility] [place], [place], [address] Phone: [phone]   Fax:     Name: Joyce Potter MRN: [id number] Date of Birth: 1946/09/08

## 2019-01-01 DIAGNOSIS — R69 Illness, unspecified: Secondary | ICD-10-CM | POA: Diagnosis not present

## 2019-01-05 ENCOUNTER — Ambulatory Visit
Admission: RE | Admit: 2019-01-05 | Discharge: 2019-01-05 | Disposition: A | Discharge status: Home / Self Care | Payer: Medicare HMO | Source: Ambulatory Visit | Attending: Family Medicine | Admitting: Family Medicine

## 2019-01-05 DIAGNOSIS — Z1231 Encounter for screening mammogram for malignant neoplasm of breast: Secondary | ICD-10-CM | POA: Insufficient documentation

## 2019-01-09 DIAGNOSIS — M25561 Pain in right knee: Secondary | ICD-10-CM | POA: Diagnosis not present

## 2019-01-09 DIAGNOSIS — G8929 Other chronic pain: Secondary | ICD-10-CM | POA: Diagnosis not present

## 2019-01-09 DIAGNOSIS — M2391 Unspecified internal derangement of right knee: Secondary | ICD-10-CM | POA: Diagnosis not present

## 2019-01-09 DIAGNOSIS — M1711 Unilateral primary osteoarthritis, right knee: Secondary | ICD-10-CM | POA: Diagnosis not present

## 2019-01-12 DIAGNOSIS — J069 Acute upper respiratory infection, unspecified: Secondary | ICD-10-CM | POA: Diagnosis not present

## 2019-01-12 DIAGNOSIS — I10 Essential (primary) hypertension: Secondary | ICD-10-CM | POA: Diagnosis not present

## 2019-01-23 DIAGNOSIS — R002 Palpitations: Secondary | ICD-10-CM | POA: Diagnosis not present

## 2019-01-23 DIAGNOSIS — I10 Essential (primary) hypertension: Secondary | ICD-10-CM | POA: Diagnosis not present

## 2019-01-23 DIAGNOSIS — E1159 Type 2 diabetes mellitus with other circulatory complications: Secondary | ICD-10-CM | POA: Diagnosis not present

## 2019-01-23 DIAGNOSIS — I350 Nonrheumatic aortic (valve) stenosis: Secondary | ICD-10-CM | POA: Diagnosis not present

## 2019-01-23 DIAGNOSIS — R42 Dizziness and giddiness: Secondary | ICD-10-CM | POA: Diagnosis not present

## 2019-01-23 DIAGNOSIS — I2510 Atherosclerotic heart disease of native coronary artery without angina pectoris: Secondary | ICD-10-CM | POA: Diagnosis not present

## 2019-01-23 DIAGNOSIS — E7800 Pure hypercholesterolemia, unspecified: Secondary | ICD-10-CM | POA: Diagnosis not present

## 2019-01-27 ENCOUNTER — Emergency Department
Admission: EM | Admit: 2019-01-27 | Discharge: 2019-01-27 | Disposition: A | Discharge status: Home / Self Care | Payer: Medicare HMO | Attending: Emergency Medicine | Admitting: Emergency Medicine

## 2019-01-27 ENCOUNTER — Emergency Department: Payer: Medicare HMO

## 2019-01-27 ENCOUNTER — Other Ambulatory Visit: Payer: Self-pay

## 2019-01-27 ENCOUNTER — Encounter: Payer: Self-pay | Admitting: Emergency Medicine

## 2019-01-27 DIAGNOSIS — Z9861 Coronary angioplasty status: Secondary | ICD-10-CM | POA: Insufficient documentation

## 2019-01-27 DIAGNOSIS — Z79899 Other long term (current) drug therapy: Secondary | ICD-10-CM | POA: Diagnosis not present

## 2019-01-27 DIAGNOSIS — E119 Type 2 diabetes mellitus without complications: Secondary | ICD-10-CM | POA: Diagnosis not present

## 2019-01-27 DIAGNOSIS — I2510 Atherosclerotic heart disease of native coronary artery without angina pectoris: Secondary | ICD-10-CM | POA: Insufficient documentation

## 2019-01-27 DIAGNOSIS — N390 Urinary tract infection, site not specified: Secondary | ICD-10-CM | POA: Insufficient documentation

## 2019-01-27 DIAGNOSIS — Z8673 Personal history of transient ischemic attack (TIA), and cerebral infarction without residual deficits: Secondary | ICD-10-CM | POA: Insufficient documentation

## 2019-01-27 DIAGNOSIS — Z85828 Personal history of other malignant neoplasm of skin: Secondary | ICD-10-CM | POA: Diagnosis not present

## 2019-01-27 DIAGNOSIS — R42 Dizziness and giddiness: Secondary | ICD-10-CM | POA: Insufficient documentation

## 2019-01-27 DIAGNOSIS — R112 Nausea with vomiting, unspecified: Secondary | ICD-10-CM | POA: Diagnosis not present

## 2019-01-27 DIAGNOSIS — Z794 Long term (current) use of insulin: Secondary | ICD-10-CM | POA: Diagnosis not present

## 2019-01-27 DIAGNOSIS — R001 Bradycardia, unspecified: Secondary | ICD-10-CM | POA: Diagnosis not present

## 2019-01-27 DIAGNOSIS — I10 Essential (primary) hypertension: Secondary | ICD-10-CM | POA: Diagnosis not present

## 2019-01-27 DIAGNOSIS — R1110 Vomiting, unspecified: Secondary | ICD-10-CM | POA: Diagnosis present

## 2019-01-27 DIAGNOSIS — I700 Atherosclerosis of aorta: Secondary | ICD-10-CM | POA: Diagnosis not present

## 2019-01-27 DIAGNOSIS — Z87891 Personal history of nicotine dependence: Secondary | ICD-10-CM | POA: Insufficient documentation

## 2019-01-27 LAB — URINALYSIS, COMPLETE (UACMP) WITH MICROSCOPIC
Bilirubin Urine: NEGATIVE
Glucose, UA: NEGATIVE mg/dL
Ketones, ur: 5 mg/dL — AB
Nitrite: POSITIVE — AB
PROTEIN: 100 mg/dL — AB
Specific Gravity, Urine: 1.027 (ref 1.005–1.030)
Squamous Epithelial / HPF: NONE SEEN (ref 0–5)
pH: 5 (ref 5.0–8.0)

## 2019-01-27 LAB — CBC WITH DIFFERENTIAL/PLATELET
Abs Immature Granulocytes: 0.01 10*3/uL (ref 0.00–0.07)
Basophils Absolute: 0.1 10*3/uL (ref 0.0–0.1)
Basophils Relative: 1 %
Eosinophils Absolute: 0.3 10*3/uL (ref 0.0–0.5)
Eosinophils Relative: 5 %
HCT: 39.8 % (ref 36.0–46.0)
Hemoglobin: 12.7 g/dL (ref 12.0–15.0)
Immature Granulocytes: 0 %
Lymphocytes Relative: 18 %
Lymphs Abs: 1.3 10*3/uL (ref 0.7–4.0)
MCH: 26.5 pg (ref 26.0–34.0)
MCHC: 31.9 g/dL (ref 30.0–36.0)
MCV: 82.9 fL (ref 80.0–100.0)
Monocytes Absolute: 0.7 10*3/uL (ref 0.1–1.0)
Monocytes Relative: 10 %
NEUTROS PCT: 66 %
Neutro Abs: 4.6 10*3/uL (ref 1.7–7.7)
Platelets: 264 10*3/uL (ref 150–400)
RBC: 4.8 MIL/uL (ref 3.87–5.11)
RDW: 15.7 % — ABNORMAL HIGH (ref 11.5–15.5)
WBC: 6.9 10*3/uL (ref 4.0–10.5)
nRBC: 0 % (ref 0.0–0.2)

## 2019-01-27 LAB — COMPREHENSIVE METABOLIC PANEL
ALT: 20 U/L (ref 0–44)
AST: 23 U/L (ref 15–41)
Albumin: 3.7 g/dL (ref 3.5–5.0)
Alkaline Phosphatase: 89 U/L (ref 38–126)
Anion gap: 5 (ref 5–15)
BUN: 21 mg/dL (ref 8–23)
CO2: 30 mmol/L (ref 22–32)
Calcium: 9.2 mg/dL (ref 8.9–10.3)
Chloride: 102 mmol/L (ref 98–111)
Creatinine, Ser: 0.87 mg/dL (ref 0.44–1.00)
GFR calc Af Amer: >60 mL/min (ref >60)
GFR calc non Af Amer: >60 mL/min (ref >60)
GLUCOSE: 111 mg/dL — AB (ref 70–99)
Potassium: 3.6 mmol/L (ref 3.5–5.1)
Sodium: 137 mmol/L (ref 135–145)
TOTAL PROTEIN: 7.4 g/dL (ref 6.5–8.1)
Total Bilirubin: 0.9 mg/dL (ref 0.3–1.2)

## 2019-01-27 LAB — SAMPLE TO BLOOD BANK

## 2019-01-27 LAB — HEMOGLOBIN AND HEMATOCRIT, BLOOD
HCT: 38 % (ref 36.0–46.0)
Hemoglobin: 12.2 g/dL (ref 12.0–15.0)

## 2019-01-27 LAB — TROPONIN I: Troponin I: <0.03 ng/mL (ref <0.03)

## 2019-01-27 MED ORDER — SODIUM CHLORIDE 0.9 % IV SOLN
1.0000 g | Freq: Once | INTRAVENOUS | Status: AC
  Administered 2019-01-27: 1 g via INTRAVENOUS
  Filled 2019-01-27: qty 10

## 2019-01-27 MED ORDER — SODIUM CHLORIDE 0.9 % IV BOLUS
1000.0000 mL | Freq: Once | INTRAVENOUS | Status: AC
  Administered 2019-01-27: 1000 mL via INTRAVENOUS

## 2019-01-27 MED ORDER — CEPHALEXIN 500 MG PO CAPS: 500.0000 mg | ORAL_CAPSULE | Freq: Three times a day (TID) | ORAL | 0 refills | Status: DC

## 2019-01-27 MED ORDER — PANTOPRAZOLE SODIUM 40 MG PO TBEC: 40.0000 mg | DELAYED_RELEASE_TABLET | Freq: Every day | ORAL | 1 refills | Status: DC

## 2019-01-27 MED ORDER — MECLIZINE HCL 25 MG PO TABS
25.0000 mg | ORAL_TABLET | Freq: Once | ORAL | Status: AC
  Administered 2019-01-27: 25 mg via ORAL
  Filled 2019-01-27: qty 1

## 2019-01-27 NOTE — ED Notes (Signed)
NAD noted at time of D/C. Pt denies questions or concerns. Pt taken to the lobby via wheelchair by this RN at this time.

## 2019-01-27 NOTE — ED Provider Notes (Signed)
-----------------------------------------   4:55 PM on 01/27/2019 -----------------------------------------  Patient care assumed from Dr. Rip Harbour.  Patient's H&H is stable 12.2, slightly down from 12.7 however the patient received a liter of IV fluids in the interim.  Overall patient appears very well, no vomiting, is eating currently.  We will discharge on Protonix, and have the patient follow-up with her doctor.  Patient does have a significant urinary tract infection which is very likely the cause of her symptoms.  We will discharge on Keflex for the next 10 days.  Patient agreeable to plan of care.  Discussed return precautions.   Harvest Dark, MD 01/27/19 [phone]

## 2019-01-27 NOTE — ED Notes (Addendum)
Pt states hx of GI bleed without a known cause. Pt states several episodes of chocolate pudding consistency vomit. Pt states increased dizziness when standing, noted to be unsteady on his feet. Pt also c/o pain to L chest under breast, states "months ago" she fell and broke several ribs.

## 2019-01-27 NOTE — ED Provider Notes (Addendum)
[facility] Emergency Department Provider Note   ____________________________________________   First MD Initiated Contact with Patient 01/27/19 1213     (approximate)  I have reviewed the triage vital signs and the nursing notes.   HISTORY  Chief Complaint Dizziness; Nausea; and Emesis    HPI Joyce Potter is a 73 y.o. female patient reports that she is having some episodes of vomiting stuff that looks like chocolate pudding.  Is not black and brown her.  She also says she is dizzy and lightheaded when she stands there is also some spinning involved.  She reports pain under her left chest which is reproduced by palpation.  She says about 4 weeks ago or so she told the nurse several months but she told me 4 weeks ago or so she fell and broke several ribs.  The pain she is feeling now is the same as the pain she had then.  Past Medical History:  Diagnosis Date  . Anemia    iron deficiency  . Aortic stenosis   . Basal cell carcinoma   . CAD (coronary artery disease)    s/p Left circumflex stent in 2012  . Carotid stenosis   . Cataract   . High cholesterol   . HTN (hypertension)   . Hyperlipidemia   . IDDM (insulin dependent diabetes mellitus) ([place])   . Multiple thyroid nodules    Benign  . Peptic ulcer disease   . Retinal artery occlusion    on left    Patient Active Problem List   Diagnosis Date Noted  . Multiple rib fractures 10/11/2018  . GIB (gastrointestinal bleeding) 07/17/2018  . Melena   . Symptomatic anemia   . PVD (peripheral vascular disease) ([place]) 02/11/2017  . Iron deficiency anemia due to chronic blood loss 07/13/2016  . Central retinal artery occlusion 10/01/2015  . Carotid stenosis 10/01/2015  . Stroke ([place]) 09/28/2015  . GI bleed from an occult source with recurrent chronic blood loss anemia 04/14/2013  . CAD (coronary artery disease) with demand ischemia from anemia 04/14/2013  . IDDM (insulin dependent diabetes  mellitus) ([place]) 04/14/2013  . HTN (hypertension) 04/14/2013  . Chest pain 04/14/2013    Past Surgical History:  Procedure Laterality Date  . ABDOMINAL HYSTERECTOMY    . ARTERY BIOPSY Left 11/19/2015   Procedure: BIOPSY TEMPORAL ARTERY;  Surgeon: Algernon Huxley, MD;  Location: [facility] ORS;  Service: Vascular;  Laterality: Left;  . BREAST BIOPSY Left 2002   core- neg  . CARDIAC CATHETERIZATION N/A 08/08/2015   Procedure: Right and Left Heart Cath and Coronary Angiography;  Surgeon: Teodoro Spray, MD;  Location: [place] CV LAB;  Service: Cardiovascular;  Laterality: N/A;  . CARDIAC CATHETERIZATION Bilateral 09/03/2016   Procedure: Right/Left Heart Cath and Coronary Angiography;  Surgeon: Teodoro Spray, MD;  Location: [place] CV LAB;  Service: Cardiovascular;  Laterality: Bilateral;  . CATARACT EXTRACTION W/ INTRAOCULAR LENS  IMPLANT, BILATERAL    . COLONOSCOPY     in 2013- internal hemorrhoids  . COLONOSCOPY WITH PROPOFOL N/A 07/07/2018   Procedure: COLONOSCOPY WITH PROPOFOL;  Surgeon: Manya Silvas, MD;  Location[facility] ENDOSCOPY;  Service: Endoscopy;  Laterality: N/A;  . CORONARY ANGIOGRAPHY N/A 09/14/2017   Procedure: CORONARY ANGIOGRAPHY;  Surgeon: Teodoro Spray, MD;  Location: [place] CV LAB;  Service: Cardiovascular;  Laterality: N/A;  . CORONARY ANGIOPLASTY    . CORONARY STENT PLACEMENT    . ENDARTERECTOMY Left 10/01/2015   Procedure: ENDARTERECTOMY  CAROTID;  Surgeon: Algernon Huxley, MD;  Location: [facility] ORS;  Service: Vascular;  Laterality: Left;  . ESOPHAGOGASTRODUODENOSCOPY    . ESOPHAGOGASTRODUODENOSCOPY (EGD) WITH PROPOFOL N/A 05/31/2016   Procedure: ESOPHAGOGASTRODUODENOSCOPY (EGD) WITH PROPOFOL;  Surgeon: Manya Silvas, MD;  Location: [facility] ENDOSCOPY;  Service: Endoscopy;  Laterality: N/A;  . ESOPHAGOGASTRODUODENOSCOPY (EGD) WITH PROPOFOL N/A 07/07/2018   Procedure: ESOPHAGOGASTRODUODENOSCOPY (EGD) WITH PROPOFOL;  Surgeon: Manya Silvas, MD;  Location: [facility]  ENDOSCOPY;  Service: Endoscopy;  Laterality: N/A;  . ESOPHAGOGASTRODUODENOSCOPY (EGD) WITH PROPOFOL N/A 07/17/2018   Procedure: ESOPHAGOGASTRODUODENOSCOPY (EGD) WITH PROPOFOL;  Surgeon: Lucilla Lame, MD;  Location: [facility] ENDOSCOPY;  Service: Endoscopy;  Laterality: N/A;  . EYE SURGERY    . GIVENS CAPSULE STUDY N/A 04/17/2013   Procedure: GIVENS CAPSULE STUDY;  Surgeon: Arta Silence, MD;  Location: [facility] ENDOSCOPY;  Service: Endoscopy;  Laterality: N/A;  patient ate breakfast at 7am   . PARTIAL HYSTERECTOMY    . RIGHT AND LEFT HEART CATH Bilateral 09/14/2017   Procedure: RIGHT AND LEFT HEART CATH;  Surgeon: Teodoro Spray, MD;  Location: [place] CV LAB;  Service: Cardiovascular;  Laterality: Bilateral;  . TRIGGER FINGER RELEASE      Prior to Admission medications   Medication Sig Start Date End Date Taking? Authorizing Provider  acetaminophen (TYLENOL) 325 MG tablet Take 2 tablets (650 mg total) by mouth every 6 (six) hours as needed for mild pain (or Fever >/= 101). 10/12/18   Vaughan Basta, MD  albuterol (PROAIR HFA) 108 (90 Base) MCG/ACT inhaler Inhale 2 puffs into the lungs every 4 (four) hours as needed for wheezing or shortness of breath.     [provider]  alendronate (FOSAMAX) 70 MG tablet Take 70 mg by mouth every Tuesday.     [provider]  bisacodyl (DULCOLAX) 5 MG EC tablet Take 1 tablet (5 mg total) by mouth daily as needed for moderate constipation. 10/12/18   Vaughan Basta, MD  Emollient (CERAVE) CREA Apply 1 application topically daily as needed (for dry skin).     [provider]  insulin NPH-regular Human (NOVOLIN 70/30) (70-30) 100 UNIT/ML injection Inject 60-70 Units into the skin See admin instructions. 80 units every morning and 60 units every afternoon    [provider]  metoprolol tartrate (LOPRESSOR) 50 MG tablet Take 2 tablets (100 mg total) by mouth 2 (two) times daily. 07/18/18   Salary, Avel Peace, MD    omeprazole (PRILOSEC) 40 MG capsule Take 40 mg by mouth 2 (two) times daily.     [provider]  pravastatin (PRAVACHOL) 10 MG tablet Take 1 tablet (10 mg total) by mouth daily at 6 PM. 09/29/15   Mody, Sital, MD  senna-docusate (SENOKOT-S) 8.6-50 MG tablet Take 1 tablet by mouth 2 (two) times daily. 10/12/18   Vaughan Basta, MD  tiotropium (SPIRIVA HANDIHALER) 18 MCG inhalation capsule Place 18 mcg into inhaler and inhale daily.  09/22/17 12/07/18  [provider]    Allergies Aspirin; Invokamet [canagliflozin-metformin hcl]; Sulfa antibiotics; Lovastatin; Penicillins; and Vicodin [hydrocodone-acetaminophen]  Family History  Problem Relation Age of Onset  . Uterine cancer Mother   . Breast cancer Mother 19  . Seizures Father   . Stroke Father   . Diabetes Father   . COPD Father   . Colon cancer Neg Hx     Social History Social History   Tobacco Use  . Smoking status: Former Smoker    Last attempt to quit: 02/15/1985  Years since quitting: 33.9  . Smokeless tobacco: Never Used  . Tobacco comment: quit about 31 years ago  Substance Use Topics  . Alcohol use: No    Alcohol/week: 0.0 standard drinks  . Drug use: No    Review of Systems  Constitutional: No fever/chills Eyes: No visual changes. ENT: No sore throat. Cardiovascular: Denies chest pain. Respiratory: Denies shortness of breath. Gastrointestinal: No abdominal pain.  No nausea, no vomiting.  No diarrhea.  No constipation. Genitourinary: Negative for dysuria. Musculoskeletal: Negative for back pain. Skin: Negative for rash. Neurological: Negative for headaches, focal weakness   ____________________________________________   PHYSICAL EXAM:  VITAL SIGNS: ED Triage Vitals  Enc Vitals Group     BP --      Pulse Rate 01/27/19 1208 (!) 53     Resp 01/27/19 1208 18     Temp 01/27/19 1208 98 F (36.7 C)     Temp Source 01/27/19 1208 Oral     SpO2 01/27/19 1208 98 %     Weight  01/27/19 1209 199 lb (90.3 kg)     Height 01/27/19 1209 5\' 3"  (1.6 m)     Head Circumference --      Peak Flow --      Pain Score 01/27/19 1208 6     Pain Loc --      Pain Edu? --      Excl. in [place]? --     Constitutional: Alert and oriented. Well appearing and in no acute distress. Eyes: Conjunctivae are normal. PERRL. EOMI. difficult to see fundi Head: Atraumatic. Nose: No congestion/rhinnorhea. Mouth/Throat: Mucous membranes are moist.  Oropharynx non-erythematous. Neck: No stridor.   Cardiovascular: Normal rate, regular rhythm. Grossly normal heart sounds.  Good peripheral circulation. Respiratory: Normal respiratory effort.  No retractions. Lungs CTAB. Gastrointestinal: Soft and nontender. No distention. No abdominal bruits. No CVA tenderness. Musculoskeletal: No lower extremity tenderness  Neurologic:  Normal speech and language. No gross focal neurologic deficits are appreciated.  When patient stands for more than a minute or 2 she gets unsteady and wobbly on her feet.. Skin:  Skin is warm, dry and intact. No rash noted. Psychiatric: Mood and affect are normal. Speech and behavior are normal.  ____________________________________________   LABS (all labs ordered are listed, but only abnormal results are displayed)  Labs Reviewed  COMPREHENSIVE METABOLIC PANEL - Abnormal; Notable for the following components:      Result Value   Glucose, Bld 111 (*)    All other components within normal limits  CBC WITH DIFFERENTIAL/PLATELET - Abnormal; Notable for the following components:   RDW 15.7 (*)    All other components within normal limits  URINALYSIS, COMPLETE (UACMP) WITH MICROSCOPIC - Abnormal; Notable for the following components:   Color, Urine AMBER (*)    APPearance CLOUDY (*)    Hgb urine dipstick SMALL (*)    Ketones, ur 5 (*)    Protein, ur 100 (*)    Nitrite POSITIVE (*)    Leukocytes, UA MODERATE (*)    WBC, UA >50 (*)    Bacteria, UA MANY (*)    All other  components within normal limits  TROPONIN I  HEMOGLOBIN AND HEMATOCRIT, BLOOD  SAMPLE TO BLOOD BANK   ____________________________________________  EKG  EKG read and interpreted by me shows sinus bradycardia rate of 52 normal axis nonspecific ST-T wave changes ____________________________________________  RADIOLOGY  ED MD interpretation:  Official radiology report(s): Mr Brain Wo Contrast  Result Date: 01/27/2019 CLINICAL DATA:  73 year old female with dizziness exacerbated by standing. Nausea vomiting. Left chest pain. EXAM: MRI HEAD WITHOUT CONTRAST TECHNIQUE: Multiplanar, multiecho pulse sequences of the brain and surrounding structures were obtained without intravenous contrast. COMPARISON:  Cervical spine CT 10/12/2018. Head CT 10/11/2018, brain MRI 09/28/2015. FINDINGS: Brain: No restricted diffusion to suggest acute infarction. No midline shift, mass effect, evidence of mass lesion, ventriculomegaly, extra-axial collection or acute intracranial hemorrhage. Cervicomedullary junction and pituitary are within normal limits. Scattered cerebral white matter T2 and FLAIR hyperintensity in a nonspecific configuration is mild to moderate for age and not significantly changed since 2016. Mild T2 heterogeneity in the pons is increased. Chronic microhemorrhages in the left insula and superior occipital lobe on series 9, images 14 and 16. No other chronic cerebral blood products. No cortical encephalomalacia. Small perivascular spaces in the right basal ganglia are unchanged, but there may be a small chronic lacunar infarct in the right thalamus on series 7, image 14 which is new from the prior MRI. Stable and negative cerebellum. Vascular: Major intracranial vascular flow voids appear stable since 2016. Skull and upper cervical spine: Negative. Sinuses/Orbits: Stable since 2016 and negative. Other: Mastoids are clear. Grossly normal visible internal auditory structures. Scalp and face soft tissues  appear negative. IMPRESSION: 1.  No acute intracranial abnormality. 2. Mild to moderate for age signal changes in the brain most compatible with chronic small vessel disease, mildly progressed since 2016. Electronically Signed   By: Genevie Ann M.D.   On: 01/27/2019 13:19   Dg Chest Portable 1 View  Result Date: 01/27/2019 CLINICAL DATA:  Left rib fractures as seen on the prior study. EXAM: PORTABLE CHEST 1 VIEW COMPARISON:  PA and lateral chest 10/13/2018. FINDINGS: There is cardiomegaly without edema. Lungs are clear. No pneumothorax or pleural effusion. Aortic atherosclerosis is noted. Left rib fractures are identified as seen on the prior exam. 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 IMPRESSION: Cardiomegaly without acute disease. Atherosclerosis. Remote left rib fractures.  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 Electronically Signed   By: Inge Rise M.D.   On: 01/27/2019 12:56    ____________________________________________   PROCEDURES  Procedure(s) performed:   Procedures  Critical Care performed:   ____________________________________________   INITIAL IMPRESSION / ASSESSMENT AND PLAN / ED COURSE  Patient with a normal MRI.  She does have a UTI.  We will give her some antibiotics for that we will try some Rocephin.  She has a rash with penicillin hopefully Rocephin will be safe it should be.  We will give her some fluids as well and then recheck the H&H and see if it has dropped significantly.  Have her follow-up with GI either inpatient or outpatient depending on what her H&H does and how she feels after the fluids and antibiotics.  Patient's chest pain is consistent with rib injuries.  She has rib fractures in the area where the pain is in palpation of these exactly reproduces her pain.  In addition her troponin is negative and her EKG is not showing anything acute.  Dr. Kerman Passey will monitor her.         ____________________________________________   FINAL CLINICAL IMPRESSION(S)  / ED DIAGNOSES  Final diagnoses:  Non-intractable vomiting with nausea, unspecified vomiting type  Dizziness     ED Discharge Orders    None       Note:  This document was prepared using Dragon voice recognition software and may include unintentional dictation errors.    [facility],  Regan Rakers, MD 01/27/19 1531    Nena Polio, MD 01/27/19 [phone]

## 2019-01-27 NOTE — ED Triage Notes (Signed)
Pt presents to ED via [facility] with c/o dizziness that worsens when she stands, nausea and vomiting and HTN. Pt also c/o L chest pain under her L breast, no radiation, describes as tightness, pt also c/o urinary symptoms at this time.

## 2019-01-27 NOTE — ED Notes (Signed)
Pt given meal tray.

## 2019-01-27 NOTE — ED Notes (Signed)
Pt transported to MRI 

## 2019-01-31 DIAGNOSIS — R69 Illness, unspecified: Secondary | ICD-10-CM | POA: Diagnosis not present

## 2019-02-01 DIAGNOSIS — R69 Illness, unspecified: Secondary | ICD-10-CM | POA: Diagnosis not present

## 2019-02-02 DIAGNOSIS — E1159 Type 2 diabetes mellitus with other circulatory complications: Secondary | ICD-10-CM | POA: Diagnosis not present

## 2019-02-02 DIAGNOSIS — R002 Palpitations: Secondary | ICD-10-CM | POA: Diagnosis not present

## 2019-02-02 DIAGNOSIS — I350 Nonrheumatic aortic (valve) stenosis: Secondary | ICD-10-CM | POA: Diagnosis not present

## 2019-02-02 DIAGNOSIS — I10 Essential (primary) hypertension: Secondary | ICD-10-CM | POA: Diagnosis not present

## 2019-02-02 DIAGNOSIS — E7800 Pure hypercholesterolemia, unspecified: Secondary | ICD-10-CM | POA: Diagnosis not present

## 2019-02-07 ENCOUNTER — Inpatient Hospital Stay (HOSPITAL_BASED_OUTPATIENT_CLINIC_OR_DEPARTMENT_OTHER): Payer: Medicare HMO | Admitting: Oncology

## 2019-02-07 ENCOUNTER — Inpatient Hospital Stay: Payer: Medicare HMO | Attending: Oncology

## 2019-02-07 ENCOUNTER — Encounter: Payer: Self-pay | Admitting: Oncology

## 2019-02-07 ENCOUNTER — Inpatient Hospital Stay: Payer: Medicare HMO

## 2019-02-07 VITALS — BP 202/94 | HR 56 | Temp 96.7°F | Resp 18 | Wt 201.5 lb

## 2019-02-07 DIAGNOSIS — M810 Age-related osteoporosis without current pathological fracture: Secondary | ICD-10-CM

## 2019-02-07 DIAGNOSIS — I10 Essential (primary) hypertension: Secondary | ICD-10-CM | POA: Diagnosis not present

## 2019-02-07 DIAGNOSIS — Z87891 Personal history of nicotine dependence: Secondary | ICD-10-CM

## 2019-02-07 DIAGNOSIS — R109 Unspecified abdominal pain: Secondary | ICD-10-CM

## 2019-02-07 DIAGNOSIS — N390 Urinary tract infection, site not specified: Secondary | ICD-10-CM | POA: Diagnosis not present

## 2019-02-07 DIAGNOSIS — R42 Dizziness and giddiness: Secondary | ICD-10-CM | POA: Insufficient documentation

## 2019-02-07 DIAGNOSIS — D500 Iron deficiency anemia secondary to blood loss (chronic): Secondary | ICD-10-CM

## 2019-02-07 LAB — CBC WITH DIFFERENTIAL/PLATELET
ABS IMMATURE GRANULOCYTES: 0.01 10*3/uL (ref 0.00–0.07)
BASOS ABS: 0.1 10*3/uL (ref 0.0–0.1)
Basophils Relative: 1 %
Eosinophils Absolute: 0.3 10*3/uL (ref 0.0–0.5)
Eosinophils Relative: 7 %
HCT: 36.7 % (ref 36.0–46.0)
Hemoglobin: 11.8 g/dL — ABNORMAL LOW (ref 12.0–15.0)
IMMATURE GRANULOCYTES: 0 %
LYMPHS ABS: 1.2 10*3/uL (ref 0.7–4.0)
Lymphocytes Relative: 27 %
MCH: 26.7 pg (ref 26.0–34.0)
MCHC: 32.2 g/dL (ref 30.0–36.0)
MCV: 83 fL (ref 80.0–100.0)
MONO ABS: 0.5 10*3/uL (ref 0.1–1.0)
Monocytes Relative: 12 %
Neutro Abs: 2.2 10*3/uL (ref 1.7–7.7)
Neutrophils Relative %: 53 %
Platelets: 234 10*3/uL (ref 150–400)
RBC: 4.42 MIL/uL (ref 3.87–5.11)
RDW: 15.6 % — ABNORMAL HIGH (ref 11.5–15.5)
WBC: 4.3 10*3/uL (ref 4.0–10.5)
nRBC: 0 % (ref 0.0–0.2)

## 2019-02-07 LAB — SAMPLE TO BLOOD BANK

## 2019-02-07 LAB — IRON AND TIBC
IRON: 72 ug/dL (ref 28–170)
Saturation Ratios: 26 % (ref 10.4–31.8)
TIBC: 273 ug/dL (ref 250–450)
UIBC: 201 ug/dL

## 2019-02-07 LAB — FERRITIN: Ferritin: 37 ng/mL (ref 11–307)

## 2019-02-07 MED ORDER — PHENAZOPYRIDINE HCL 200 MG PO TABS: 200.0000 mg | ORAL_TABLET | Freq: Three times a day (TID) | ORAL | 0 refills | Status: DC | PRN

## 2019-02-07 NOTE — Progress Notes (Signed)
[place]  [phone]) [phone] Fax:([phone]) [phone]  ID: LASHUNTA FRIEDEN OB: May 18, 1946  MR#: [id number]  IOM#:[id number]  Patient Care Team: Maryland Pink, MD as PCP - General (Family Medicine) Manya Silvas, MD (Gastroenterology) Lloyd Huger, MD as Consulting Physician (Hematology and Oncology)  CHIEF COMPLAINT: Iron deficiency anemia, secondary to chronic blood loss.  INTERVAL HISTORY: Patient returns to clinic today for repeat laboratory work, evaluation and consideration of IV Feraheme.  Today, she complains of abdominal pain with bloating for 3 to 4 days, recent diagnosis of a urinary tract infection currently on Keflex and dizziness when standing.  In the interim, she was seen by Laveda Norman, PA for follow-up of essential hypertension.  Per her note, patient self discontinued losartan d/t lower extremity swelling and instructed to continue her metoprolol 100 mg twice daily and Imdur 30 mg daily.  She was given a prescription for meclizine for dizziness.  REVIEW OF SYSTEMS:   Review of Systems  Constitutional: Positive for malaise/fatigue. Negative for chills, fever and weight loss.  HENT: Negative for congestion, ear pain and tinnitus.   Eyes: Negative.  Negative for blurred vision and double vision.  Respiratory: Negative.  Negative for cough, sputum production and shortness of breath.   Cardiovascular: Negative.  Negative for chest pain, palpitations and leg swelling.  Gastrointestinal: Positive for abdominal pain, nausea and vomiting. Negative for constipation and diarrhea.  Genitourinary: Positive for dysuria and flank pain. Negative for frequency and urgency.  Musculoskeletal: Negative for back pain and falls.  Skin: Negative.  Negative for rash.  Neurological: Positive for dizziness and weakness. Negative for headaches.  Endo/Heme/Allergies: Negative.  Does not bruise/bleed easily.  Psychiatric/Behavioral: Negative.  Negative for  depression. The patient is not nervous/anxious and does not have insomnia.    As per HPI. Otherwise, a complete review of systems is negative.  PAST MEDICAL HISTORY: Past Medical History:  Diagnosis Date  . Anemia    iron deficiency  . Aortic stenosis   . Basal cell carcinoma   . CAD (coronary artery disease)    s/p Left circumflex stent in 2012  . Carotid stenosis   . Cataract   . High cholesterol   . HTN (hypertension)   . Hyperlipidemia   . IDDM (insulin dependent diabetes mellitus) ([place])   . Multiple thyroid nodules    Benign  . Peptic ulcer disease   . Retinal artery occlusion    on left    PAST SURGICAL HISTORY: Past Surgical History:  Procedure Laterality Date  . ABDOMINAL HYSTERECTOMY    . ARTERY BIOPSY Left 11/19/2015   Procedure: BIOPSY TEMPORAL ARTERY;  Surgeon: Algernon Huxley, MD;  Location: [facility] ORS;  Service: Vascular;  Laterality: Left;  . BREAST BIOPSY Left 2002   core- neg  . CARDIAC CATHETERIZATION N/A 08/08/2015   Procedure: Right and Left Heart Cath and Coronary Angiography;  Surgeon: Teodoro Spray, MD;  Location: [place] [facility];  Service: Cardiovascular;  Laterality: N/A;  . CARDIAC CATHETERIZATION Bilateral 09/03/2016   Procedure: Right/Left Heart Cath and Coronary Angiography;  Surgeon: Teodoro Spray, MD;  Location: [place] [facility];  Service: Cardiovascular;  Laterality: Bilateral;  . CATARACT EXTRACTION W/ INTRAOCULAR LENS  IMPLANT, BILATERAL    . COLONOSCOPY     in 2013- internal hemorrhoids  . COLONOSCOPY WITH PROPOFOL N/A 07/07/2018   Procedure: COLONOSCOPY WITH PROPOFOL;  Surgeon: Manya Silvas, MD;  Location: [facility] ENDOSCOPY;  Service: Endoscopy;  Laterality: N/A;  . CORONARY  ANGIOGRAPHY N/A 09/14/2017   Procedure: CORONARY ANGIOGRAPHY;  Surgeon: Teodoro Spray, MD;  Location: [place] [facility];  Service: Cardiovascular;  Laterality: N/A;  . CORONARY ANGIOPLASTY    . CORONARY STENT PLACEMENT    . ENDARTERECTOMY Left 10/01/2015    Procedure: ENDARTERECTOMY CAROTID;  Surgeon: Algernon Huxley, MD;  Location: [facility] ORS;  Service: Vascular;  Laterality: Left;  . ESOPHAGOGASTRODUODENOSCOPY    . ESOPHAGOGASTRODUODENOSCOPY (EGD) WITH PROPOFOL N/A 05/31/2016   Procedure: ESOPHAGOGASTRODUODENOSCOPY (EGD) WITH PROPOFOL;  Surgeon: Manya Silvas, MD;  Location: [facility] ENDOSCOPY;  Service: Endoscopy;  Laterality: N/A;  . ESOPHAGOGASTRODUODENOSCOPY (EGD) WITH PROPOFOL N/A 07/07/2018   Procedure: ESOPHAGOGASTRODUODENOSCOPY (EGD) WITH PROPOFOL;  Surgeon: Manya Silvas, MD;  Location: [facility] ENDOSCOPY;  Service: Endoscopy;  Laterality: N/A;  . ESOPHAGOGASTRODUODENOSCOPY (EGD) WITH PROPOFOL N/A 07/17/2018   Procedure: ESOPHAGOGASTRODUODENOSCOPY (EGD) WITH PROPOFOL;  Surgeon: Lucilla Lame, MD;  Location: [facility] ENDOSCOPY;  Service: Endoscopy;  Laterality: N/A;  . EYE SURGERY    . GIVENS CAPSULE STUDY N/A 04/17/2013   Procedure: GIVENS CAPSULE STUDY;  Surgeon: Arta Silence, MD;  Location: [facility] ENDOSCOPY;  Service: Endoscopy;  Laterality: N/A;  patient ate breakfast at 7am   . PARTIAL HYSTERECTOMY    . RIGHT AND LEFT HEART CATH Bilateral 09/14/2017   Procedure: RIGHT AND LEFT HEART CATH;  Surgeon: Teodoro Spray, MD;  Location: [place] [facility];  Service: Cardiovascular;  Laterality: Bilateral;  . TRIGGER FINGER RELEASE      FAMILY HISTORY Family History  Problem Relation Age of Onset  . Uterine cancer Mother   . Breast cancer Mother 95  . Seizures Father   . Stroke Father   . Diabetes Father   . COPD Father   . Colon cancer Neg Hx       ADVANCED DIRECTIVES:    HEALTH MAINTENANCE: Social History   Tobacco Use  . Smoking status: Former Smoker    Last attempt to quit: 02/15/1985    Years since quitting: 34.0  . Smokeless tobacco: Never Used  . Tobacco comment: quit about 31 years ago  Substance Use Topics  . Alcohol use: No    Alcohol/week: 0.0 standard drinks  . Drug use: No    Colonoscopy:  PAP:  Bone density:  Osteoporosis- DEXA 06/2017 (on fosamax)  Lipid panel:  Allergies  Allergen Reactions  . Aspirin Other (See Comments)    ulcers  . Invokamet [Canagliflozin-Metformin Hcl] Other (See Comments)    Yeast infection  . Sulfa Antibiotics Other (See Comments)    "Got Drunk"  . Lovastatin Rash  . Penicillins Rash    Has patient had a PCN reaction causing immediate rash, facial/tongue/throat swelling, SOB or lightheadedness with hypotension:Yes Has patient had a PCN reaction causing severe rash involving mucus membranes or skin necrosis:all over body Has patient had a PCN reaction that required hospitalization: No Has patient had a PCN reaction occurring within the last 10 years: No If all of the above answers are "NO", then may proceed with Cephalosporin use.   . Vicodin [Hydrocodone-Acetaminophen] Rash    Current Outpatient Medications  Medication Sig Dispense Refill  . acetaminophen (TYLENOL) 325 MG tablet Take 2 tablets (650 mg total) by mouth every 6 (six) hours as needed for mild pain (or Fever >/= 101). 30 tablet 0  . albuterol (PROAIR HFA) 108 (90 Base) MCG/ACT inhaler Inhale 2 puffs into the lungs every 4 (four) hours as needed for wheezing or shortness of breath.     Marland Kitchen alendronate (  FOSAMAX) 70 MG tablet Take 70 mg by mouth every Tuesday.     . bisacodyl (DULCOLAX) 5 MG EC tablet Take 1 tablet (5 mg total) by mouth daily as needed for moderate constipation. 30 tablet 0  . cephALEXin (KEFLEX) 500 MG capsule Take 1 capsule (500 mg total) by mouth 3 (three) times daily. 30 capsule 0  . Emollient (CERAVE) CREA Apply 1 application topically daily as needed (for dry skin).     . insulin NPH-regular Human (NOVOLIN 70/30) (70-30) 100 UNIT/ML injection Inject 60-70 Units into the skin See admin instructions. 80 units every morning and 60 units every afternoon    . losartan (COZAAR) 25 MG tablet Take 25 mg by mouth daily.    . metoprolol tartrate (LOPRESSOR) 50 MG tablet Take 2 tablets (100 mg  total) by mouth 2 (two) times daily. 60 tablet 0  . pantoprazole (PROTONIX) 40 MG tablet Take 1 tablet (40 mg total) by mouth daily. 30 tablet 1  . pravastatin (PRAVACHOL) 10 MG tablet Take 1 tablet (10 mg total) by mouth daily at 6 PM. 30 tablet 0  . senna-docusate (SENOKOT-S) 8.6-50 MG tablet Take 1 tablet by mouth 2 (two) times daily. 20 tablet 0  . tiotropium (SPIRIVA HANDIHALER) 18 MCG inhalation capsule Place 18 mcg into inhaler and inhale daily.      No current facility-administered medications for this visit.    Facility-Administered Medications Ordered in Other Visits  Medication Dose Route Frequency Provider Last Rate Last Dose  . heparin lock flush 100 unit/mL  500 Units Intravenous Once Verlon Au, NP      . sodium chloride flush (NS) 0.9 % injection 10 mL  10 mL Intravenous Once Verlon Au, NP        OBJECTIVE: There were no vitals filed for this visit.   There is no height or weight on file to calculate BMI.    ECOG FS:1 - Symptomatic but completely ambulatory  Physical Exam  Constitutional: She is oriented to person, place, and time and well-developed, well-nourished, and in no distress. Vital signs are normal.  HENT:  Head: Normocephalic and atraumatic.  Eyes: Pupils are equal, round, and reactive to light.  Neck: Normal range of motion.  Cardiovascular: Normal rate, regular rhythm and normal heart sounds.  No murmur heard. Pulmonary/Chest: Effort normal and breath sounds normal. She has no wheezes.  Abdominal: Soft. Normal appearance and bowel sounds are normal. She exhibits no distension. There is no abdominal tenderness.  Musculoskeletal: Normal range of motion.        General: No edema.  Neurological: She is alert and oriented to person, place, and time. Gait normal.  Skin: Skin is warm and dry. No rash noted.  Psychiatric: Mood, memory, affect and judgment normal.    LAB RESULTS:  Lab Results  Component Value Date   NA 137 01/27/2019   K 3.6  01/27/2019   CL 102 01/27/2019   CO2 30 01/27/2019   GLUCOSE 111 (H) 01/27/2019   BUN 21 01/27/2019   CREATININE 0.87 01/27/2019   CALCIUM 9.2 01/27/2019   PROT 7.4 01/27/2019   ALBUMIN 3.7 01/27/2019   AST 23 01/27/2019   ALT 20 01/27/2019   ALKPHOS 89 01/27/2019   BILITOT 0.9 01/27/2019   GFRNONAA >60 01/27/2019   GFRAA >60 01/27/2019    Lab Results  Component Value Date   WBC 4.3 02/07/2019   NEUTROABS 2.2 02/07/2019   HGB 11.8 (L) 02/07/2019   HCT 36.7 02/07/2019  MCV 83.0 02/07/2019   PLT 234 02/07/2019   Lab Results  Component Value Date   IRON 48 12/07/2018   TIBC 244 (L) 12/07/2018   IRONPCTSAT 20 12/07/2018    Lab Results  Component Value Date   FERRITIN 96 12/07/2018     STUDIES: Mr Brain Wo Contrast  Result Date: 01/27/2019 CLINICAL DATA:  73 year old female with dizziness exacerbated by standing. Nausea vomiting. Left chest pain. EXAM: MRI HEAD WITHOUT CONTRAST TECHNIQUE: Multiplanar, multiecho pulse sequences of the brain and surrounding structures were obtained without intravenous contrast. COMPARISON:  Cervical spine CT 10/12/2018. Head CT 10/11/2018, brain MRI 09/28/2015. FINDINGS: Brain: No restricted diffusion to suggest acute infarction. No midline shift, mass effect, evidence of mass lesion, ventriculomegaly, extra-axial collection or acute intracranial hemorrhage. Cervicomedullary junction and pituitary are within normal limits. Scattered cerebral white matter T2 and FLAIR hyperintensity in a nonspecific configuration is mild to moderate for age and not significantly changed since 2016. Mild T2 heterogeneity in the pons is increased. Chronic microhemorrhages in the left insula and superior occipital lobe on series 9, images 14 and 16. No other chronic cerebral blood products. No cortical encephalomalacia. Small perivascular spaces in the right basal ganglia are unchanged, but there may be a small chronic lacunar infarct in the right thalamus on series 7,  image 14 which is new from the prior MRI. Stable and negative cerebellum. Vascular: Major intracranial vascular flow voids appear stable since 2016. Skull and upper cervical spine: Negative. Sinuses/Orbits: Stable since 2016 and negative. Other: Mastoids are clear. Grossly normal visible internal auditory structures. Scalp and face soft tissues appear negative. IMPRESSION: 1.  No acute intracranial abnormality. 2. Mild to moderate for age signal changes in the brain most compatible with chronic small vessel disease, mildly progressed since 2016. Electronically Signed   By: Genevie Ann M.D.   On: 01/27/2019 13:19   Dg Chest Portable 1 View  Result Date: 01/27/2019 CLINICAL DATA:  Left rib fractures as seen on the prior study. EXAM: PORTABLE CHEST 1 VIEW COMPARISON:  PA and lateral chest 10/13/2018. FINDINGS: There is cardiomegaly without edema. Lungs are clear. No pneumothorax or pleural effusion. Aortic atherosclerosis is noted. Left rib fractures are identified as seen on the prior exam. 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 IMPRESSION: Cardiomegaly without acute disease. Atherosclerosis. Remote left rib fractures.  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 Electronically Signed   By: Inge Rise M.D.   On: 01/27/2019 12:56    ASSESSMENT: Iron deficiency anemia, secondary to chronic blood loss.  PLAN:    1. Iron deficiency anemia, secondary to chronic blood loss-nuclear medicine bleeding scan completed on 08/23/2018 did not reveal any significant GI bleed.  Bone marrow biopsy on 02/15/2017 did not reveal any significant pathology.  Again discussed second opinion with [facility].  Hemoglobin improved and has maintained the last several visits and today is 11.8.  Ferritin and iron studies continue to improve.  She denies melena.  Continue to hold IV Feraheme and return to clinic in 3 months with labs, assessment and possible IV Feraheme.  Patient agreeable 2. Melena: Resolved.  Had extensive work-up in July 2019 revealing  overall normal findings from colonoscopy and endoscopy.  Case discussed with [facility].  No further recommendations at this time.  Continue to monitor. 3.  Urinary tract infection: Continue Keflex as prescribed.  Given concerns with burning during urination will try Pyridium 3 times daily as needed. 4.  Dizziness: Appears to be chronic in  nature.  Recently had MRI of her brain which was negative for acute abnormalities.  Likely related to uncontrolled hypertension.  At today's visit, blood pressure extremely elevated.  Unclear of what medications patient is taking at this time.  Have reached out to Dr. Bethanne Ginger office for clarification and/or appointment in the next day or so.  Tillie Rung, RN spoke with office and plan is for them to call today for appointment.  Per cardiology's last note, patient self stopped her losartan.  Today she tells me she is taking losartan and metoprolol.  A total of (25) minutes of face-to-face time was spent with this patient with greater than 50% of that time in counseling and care-coordination.  Patient expressed understanding and was in agreement with this plan. She also understands that She can call clinic at any time with any questions, concerns, or complaints.    Jacquelin Hawking, NP 02/07/19 8:41 AM

## 2019-02-08 ENCOUNTER — Other Ambulatory Visit: Payer: Self-pay | Admitting: Cardiology

## 2019-02-08 ENCOUNTER — Other Ambulatory Visit (HOSPITAL_COMMUNITY): Payer: Self-pay | Admitting: Cardiology

## 2019-02-08 DIAGNOSIS — K279 Peptic ulcer, site unspecified, unspecified as acute or chronic, without hemorrhage or perforation: Secondary | ICD-10-CM | POA: Diagnosis not present

## 2019-02-08 DIAGNOSIS — I2510 Atherosclerotic heart disease of native coronary artery without angina pectoris: Secondary | ICD-10-CM | POA: Diagnosis not present

## 2019-02-08 DIAGNOSIS — I10 Essential (primary) hypertension: Secondary | ICD-10-CM | POA: Diagnosis not present

## 2019-02-08 DIAGNOSIS — E1159 Type 2 diabetes mellitus with other circulatory complications: Secondary | ICD-10-CM | POA: Diagnosis not present

## 2019-02-08 DIAGNOSIS — R109 Unspecified abdominal pain: Secondary | ICD-10-CM

## 2019-02-09 DIAGNOSIS — E1129 Type 2 diabetes mellitus with other diabetic kidney complication: Secondary | ICD-10-CM | POA: Diagnosis not present

## 2019-02-09 DIAGNOSIS — E1142 Type 2 diabetes mellitus with diabetic polyneuropathy: Secondary | ICD-10-CM | POA: Diagnosis not present

## 2019-02-09 DIAGNOSIS — I10 Essential (primary) hypertension: Secondary | ICD-10-CM | POA: Diagnosis not present

## 2019-02-09 DIAGNOSIS — R809 Proteinuria, unspecified: Secondary | ICD-10-CM | POA: Diagnosis not present

## 2019-02-09 DIAGNOSIS — M810 Age-related osteoporosis without current pathological fracture: Secondary | ICD-10-CM | POA: Diagnosis not present

## 2019-02-09 DIAGNOSIS — E042 Nontoxic multinodular goiter: Secondary | ICD-10-CM | POA: Diagnosis not present

## 2019-02-09 DIAGNOSIS — Z794 Long term (current) use of insulin: Secondary | ICD-10-CM | POA: Diagnosis not present

## 2019-02-09 DIAGNOSIS — E1159 Type 2 diabetes mellitus with other circulatory complications: Secondary | ICD-10-CM | POA: Diagnosis not present

## 2019-02-09 DIAGNOSIS — E1165 Type 2 diabetes mellitus with hyperglycemia: Secondary | ICD-10-CM | POA: Diagnosis not present

## 2019-02-12 ENCOUNTER — Ambulatory Visit
Admission: RE | Admit: 2019-02-12 | Discharge: 2019-02-12 | Disposition: A | Discharge status: Home / Self Care | Payer: Medicare HMO | Source: Ambulatory Visit | Attending: Cardiology | Admitting: Cardiology

## 2019-02-12 DIAGNOSIS — R109 Unspecified abdominal pain: Secondary | ICD-10-CM | POA: Insufficient documentation

## 2019-02-12 DIAGNOSIS — K760 Fatty (change of) liver, not elsewhere classified: Secondary | ICD-10-CM | POA: Diagnosis not present

## 2019-02-13 ENCOUNTER — Ambulatory Visit (INDEPENDENT_AMBULATORY_CARE_PROVIDER_SITE_OTHER): Payer: Medicare HMO

## 2019-02-13 ENCOUNTER — Ambulatory Visit (INDEPENDENT_AMBULATORY_CARE_PROVIDER_SITE_OTHER): Payer: Medicare HMO | Admitting: Vascular Surgery

## 2019-02-13 ENCOUNTER — Other Ambulatory Visit: Payer: Self-pay

## 2019-02-13 ENCOUNTER — Encounter (INDEPENDENT_AMBULATORY_CARE_PROVIDER_SITE_OTHER): Payer: Self-pay | Admitting: Vascular Surgery

## 2019-02-13 VITALS — BP 176/105 | HR 69 | Resp 12 | Ht 63.0 in | Wt 205.0 lb

## 2019-02-13 DIAGNOSIS — I6523 Occlusion and stenosis of bilateral carotid arteries: Secondary | ICD-10-CM

## 2019-02-13 DIAGNOSIS — I10 Essential (primary) hypertension: Secondary | ICD-10-CM | POA: Diagnosis not present

## 2019-02-13 DIAGNOSIS — I739 Peripheral vascular disease, unspecified: Secondary | ICD-10-CM

## 2019-02-13 DIAGNOSIS — Z87891 Personal history of nicotine dependence: Secondary | ICD-10-CM | POA: Diagnosis not present

## 2019-02-13 NOTE — Assessment & Plan Note (Addendum)
No new symptoms.  ABIs remain OK. Recheck in two years

## 2019-02-13 NOTE — Assessment & Plan Note (Signed)
Duplex today demonstrates stable 1 to 39% right ICA stenosis and a patent left carotid endarterectomy without recurrent stenosis.  Continue current medical regimen.  Recheck in 1 year.

## 2019-02-13 NOTE — Progress Notes (Signed)
MRN : [id number]  Joyce Potter is a 73 y.o. (May 09, 1946) female who presents with chief complaint of  Chief Complaint  Patient presents with  . Follow-up  .  History of Present Illness: Patient returns in follow-up of multiple issues.  She is doing reasonably well today without complaints.  No lifestyle limiting claudication, ischemic rest pain, or ulceration.  ABIs remain in the normal range today.  She is several years status post left carotid endarterectomy for high-grade stenosis.  No recent focal neurologic symptoms. Specifically, the patient denies amaurosis fugax, speech or swallowing difficulties, or arm or leg weakness or numbness. Duplex today demonstrates stable 1 to 39% right ICA stenosis and a patent left carotid endarterectomy without recurrent stenosis.  Current Outpatient Medications  Medication Sig Dispense Refill  . acetaminophen (TYLENOL) 325 MG tablet Take 2 tablets (650 mg total) by mouth every 6 (six) hours as needed for mild pain (or Fever >/= 101). 30 tablet 0  . albuterol (PROAIR HFA) 108 (90 Base) MCG/ACT inhaler Inhale 2 puffs into the lungs every 4 (four) hours as needed for wheezing or shortness of breath.     Marland Kitchen alendronate (FOSAMAX) 70 MG tablet Take 70 mg by mouth every Tuesday.     . bisacodyl (DULCOLAX) 5 MG EC tablet Take 1 tablet (5 mg total) by mouth daily as needed for moderate constipation. 30 tablet 0  . Emollient (CERAVE) CREA Apply 1 application topically daily as needed (for dry skin).     . insulin NPH-regular Human (NOVOLIN 70/30) (70-30) 100 UNIT/ML injection Inject 60-70 Units into the skin See admin instructions. 80 units every morning and 60 units every afternoon    . metoprolol tartrate (LOPRESSOR) 50 MG tablet Take 2 tablets (100 mg total) by mouth 2 (two) times daily. 60 tablet 0  . pantoprazole (PROTONIX) 40 MG tablet Take 1 tablet (40 mg total) by mouth daily. 30 tablet 1  . pravastatin (PRAVACHOL) 10 MG tablet Take 1 tablet (10 mg  total) by mouth daily at 6 PM. 30 tablet 0  . cephALEXin (KEFLEX) 500 MG capsule Take 1 capsule (500 mg total) by mouth 3 (three) times daily. (Patient not taking: Reported on 02/13/2019) 30 capsule 0  . losartan (COZAAR) 25 MG tablet Take 25 mg by mouth daily.    . phenazopyridine (PYRIDIUM) 200 MG tablet Take 1 tablet (200 mg total) by mouth 3 (three) times daily as needed for pain. (Patient not taking: Reported on 02/13/2019) 10 tablet 0  . senna-docusate (SENOKOT-S) 8.6-50 MG tablet Take 1 tablet by mouth 2 (two) times daily. (Patient not taking: Reported on 02/13/2019) 20 tablet 0  . tiotropium (SPIRIVA HANDIHALER) 18 MCG inhalation capsule Place 18 mcg into inhaler and inhale daily.      No current facility-administered medications for this visit.    Facility-Administered Medications Ordered in Other Visits  Medication Dose Route Frequency Provider Last Rate Last Dose  . heparin lock flush 100 unit/mL  500 Units Intravenous Once Verlon Au, NP      . sodium chloride flush (NS) 0.9 % injection 10 mL  10 mL Intravenous Once Verlon Au, NP        Past Medical History:  Diagnosis Date  . Anemia    iron deficiency  . Aortic stenosis   . Basal cell carcinoma   . CAD (coronary artery disease)    s/p Left circumflex stent in 2012  . Carotid stenosis   . Cataract   .  High cholesterol   . HTN (hypertension)   . Hyperlipidemia   . IDDM (insulin dependent diabetes mellitus) ([place])   . Multiple thyroid nodules    Benign  . Peptic ulcer disease   . Retinal artery occlusion    on left    Past Surgical History:  Procedure Laterality Date  . ABDOMINAL HYSTERECTOMY    . ARTERY BIOPSY Left 11/19/2015   Procedure: BIOPSY TEMPORAL ARTERY;  Surgeon: Algernon Huxley, MD;  Location: [facility] ORS;  Service: Vascular;  Laterality: Left;  . BREAST BIOPSY Left 2002   core- neg  . CARDIAC CATHETERIZATION N/A 08/08/2015   Procedure: Right and Left Heart Cath and Coronary Angiography;  Surgeon:  Teodoro Spray, MD;  Location: [place] [facility];  Service: Cardiovascular;  Laterality: N/A;  . CARDIAC CATHETERIZATION Bilateral 09/03/2016   Procedure: Right/Left Heart Cath and Coronary Angiography;  Surgeon: Teodoro Spray, MD;  Location: [place] [facility];  Service: Cardiovascular;  Laterality: Bilateral;  . CATARACT EXTRACTION W/ INTRAOCULAR LENS  IMPLANT, BILATERAL    . COLONOSCOPY     in 2013- internal hemorrhoids  . COLONOSCOPY WITH PROPOFOL N/A 07/07/2018   Procedure: COLONOSCOPY WITH PROPOFOL;  Surgeon: Manya Silvas, MD;  Location: [facility] ENDOSCOPY;  Service: Endoscopy;  Laterality: N/A;  . CORONARY ANGIOGRAPHY N/A 09/14/2017   Procedure: CORONARY ANGIOGRAPHY;  Surgeon: Teodoro Spray, MD;  Location: [place] [facility];  Service: Cardiovascular;  Laterality: N/A;  . CORONARY ANGIOPLASTY    . CORONARY STENT PLACEMENT    . ENDARTERECTOMY Left 10/01/2015   Procedure: ENDARTERECTOMY CAROTID;  Surgeon: Algernon Huxley, MD;  Location: [facility] ORS;  Service: Vascular;  Laterality: Left;  . ESOPHAGOGASTRODUODENOSCOPY    . ESOPHAGOGASTRODUODENOSCOPY (EGD) WITH PROPOFOL N/A 05/31/2016   Procedure: ESOPHAGOGASTRODUODENOSCOPY (EGD) WITH PROPOFOL;  Surgeon: Manya Silvas, MD;  Location: [facility] ENDOSCOPY;  Service: Endoscopy;  Laterality: N/A;  . ESOPHAGOGASTRODUODENOSCOPY (EGD) WITH PROPOFOL N/A 07/07/2018   Procedure: ESOPHAGOGASTRODUODENOSCOPY (EGD) WITH PROPOFOL;  Surgeon: Manya Silvas, MD;  Location: [facility] ENDOSCOPY;  Service: Endoscopy;  Laterality: N/A;  . ESOPHAGOGASTRODUODENOSCOPY (EGD) WITH PROPOFOL N/A 07/17/2018   Procedure: ESOPHAGOGASTRODUODENOSCOPY (EGD) WITH PROPOFOL;  Surgeon: Lucilla Lame, MD;  Location: [facility] ENDOSCOPY;  Service: Endoscopy;  Laterality: N/A;  . EYE SURGERY    . GIVENS CAPSULE STUDY N/A 04/17/2013   Procedure: GIVENS CAPSULE STUDY;  Surgeon: Arta Silence, MD;  Location: [facility] ENDOSCOPY;  Service: Endoscopy;  Laterality: N/A;  patient ate breakfast at 7am   .  PARTIAL HYSTERECTOMY    . RIGHT AND LEFT HEART CATH Bilateral 09/14/2017   Procedure: RIGHT AND LEFT HEART CATH;  Surgeon: Teodoro Spray, MD;  Location: [place] [facility];  Service: Cardiovascular;  Laterality: Bilateral;  . TRIGGER FINGER RELEASE      Social History  Substance Use Topics  . Smoking status: Former Research scientist (life sciences)  . Smokeless tobacco: Never Used     Comment: quit about 31 years ago  . Alcohol use No     Family History      Family History  Problem Relation Age of Onset  . Uterine cancer Mother   . Breast cancer Mother 1  . Seizures Father   . Stroke Father   . Diabetes Father   . COPD Father   . Colon cancer Neg Hx          Allergies  Allergen Reactions  . Aspirin Other (See Comments)    ulcers  . Invokamet [Canagliflozin-Metformin Hcl] Other (See Comments)  Yeast infection  . Sulfa Antibiotics Other (See Comments)    "Got Drunk"  . Lovastatin Rash  . Penicillins Rash  . Vicodin [Hydrocodone-Acetaminophen] Rash     REVIEW OF SYSTEMS(Negative unless checked)  Constitutional: [] ?Weight loss[] ?Fever[] ?Chills Cardiac:[] ?Chest pain[] ?Chest pressure[] ?Palpitations [] ?Shortness of breath when laying flat [] ?Shortness of breath at rest [] ?Shortness of breath with exertion. Vascular: [] ?Pain in legs with walking[] ?Pain in legsat rest[] ?Pain in legs when laying flat [] ?Claudication [] ?Pain in feet when walking [] ?Pain in feet at rest [] ?Pain in feet when laying flat [] ?History of DVT [] ?Phlebitis [] ?Swelling in legs [] ?Varicose veins [] ?Non-healing ulcers Pulmonary: [] ?Uses home oxygen [] ?Productive cough[] ?Hemoptysis [] ?Wheeze [] ?COPD [] ?Asthma Neurologic: [] ?Dizziness [] ?Blackouts [] ?Seizures [] ?History of stroke [] ?History of TIA[] ?Aphasia [] ?Temporary blindness[] ?Dysphagia [] ?Weaknessor numbness in arms [] ?Weakness or numbnessin legs Musculoskeletal:  [x] ?Arthritis [] ?Joint swelling [] ?Joint pain [] ?Low back pain Hematologic:[] ?Easy bruising[] ?Easy bleeding [] ?Hypercoagulable state [] ?Anemic [] ?Hepatitis Gastrointestinal:[] ?Blood in stool[] ?Vomiting blood[] ?Gastroesophageal reflux/heartburn[] ?Difficulty swallowing. Genitourinary: [] ?Chronic kidney disease [] ?Difficulturination [] ?Frequenturination [] ?Burning with urination[] ?Blood in urine Skin: [] ?Rashes [] ?Ulcers [] ?Wounds Psychological: [] ?History of anxiety[] ?History of major depression.    Physical Examination  Vitals:   02/13/19 1507  BP: (!) 176/105  Pulse: 69  Resp: 12  Weight: 205 lb (93 kg)  Height: 5\' 3"  (1.6 m)   Body mass index is 36.31 kg/m. Gen:  WD/WN, NAD Head: [place]/AT, No temporalis wasting. Ear/Nose/Throat: Hearing grossly intact, nares w/o erythema or drainage, trachea midline Eyes: Conjunctiva clear. Sclera non-icteric Neck: Supple.   Systolic murmur radiates into the neck Pulmonary:  Good air movement, equal and clear to auscultation bilaterally.  Cardiac: RRR, No JVD, systolic murmur Vascular:  Vessel Right Left  Radial Palpable Palpable                          PT Palpable Palpable  DP Palpable Palpable    Musculoskeletal: M/S 5/5 throughout.  No deformity or atrophy. Neurologic: CN 2-12 intact. Sensation grossly intact in extremities.  Symmetrical.  Speech is fluent. Motor exam as listed above. Psychiatric: Judgment intact, Mood & affect appropriate for pt's clinical situation. Dermatologic: No rashes or ulcers noted.  No cellulitis or open wounds.      CBC Lab Results  Component Value Date   WBC 4.3 02/07/2019   HGB 11.8 (L) 02/07/2019   HCT 36.7 02/07/2019   MCV 83.0 02/07/2019   PLT 234 02/07/2019    BMET    Component Value Date/Time   NA 137 01/27/2019 1215   NA 143 05/04/2013 0614   K 3.6 01/27/2019 1215   K 3.8 05/04/2013 0614   CL 102 01/27/2019 1215   CL 110 (H)  05/04/2013 0614   CO2 30 01/27/2019 1215   CO2 27 05/04/2013 0614   GLUCOSE 111 (H) 01/27/2019 1215   GLUCOSE 130 (H) 05/04/2013 0614   BUN 21 01/27/2019 1215   BUN 12 05/04/2013 0614   CREATININE 0.87 01/27/2019 1215   CREATININE 0.79 08/27/2014 1445   CALCIUM 9.2 01/27/2019 1215   CALCIUM 8.2 (L) 05/04/2013 0614   GFRNONAA >60 01/27/2019 1215   GFRNONAA >60 08/27/2014 1445   GFRAA >60 01/27/2019 1215   GFRAA >60 08/27/2014 1445   Estimated Creatinine Clearance: 64.2 mL/min (by C-G formula based on SCr of 0.87 mg/dL).  COAG Lab Results  Component Value Date   INR 0.98 02/15/2017   INR 1.02 11/28/2016   INR 1.11 11/19/2015    Radiology Mr Brain Wo Contrast  Result Date: 01/27/2019 CLINICAL DATA:  73 year old female with dizziness exacerbated by standing. Nausea  vomiting. Left chest pain. EXAM: MRI HEAD WITHOUT CONTRAST TECHNIQUE: Multiplanar, multiecho pulse sequences of the brain and surrounding structures were obtained without intravenous contrast. COMPARISON:  Cervical spine CT 10/12/2018. Head CT 10/11/2018, brain MRI 09/28/2015. FINDINGS: Brain: No restricted diffusion to suggest acute infarction. No midline shift, mass effect, evidence of mass lesion, ventriculomegaly, extra-axial collection or acute intracranial hemorrhage. Cervicomedullary junction and pituitary are within normal limits. Scattered cerebral white matter T2 and FLAIR hyperintensity in a nonspecific configuration is mild to moderate for age and not significantly changed since 2016. Mild T2 heterogeneity in the pons is increased. Chronic microhemorrhages in the left insula and superior occipital lobe on series 9, images 14 and 16. No other chronic cerebral blood products. No cortical encephalomalacia. Small perivascular spaces in the right basal ganglia are unchanged, but there may be a small chronic lacunar infarct in the right thalamus on series 7, image 14 which is new from the prior MRI. Stable and negative  cerebellum. Vascular: Major intracranial vascular flow voids appear stable since 2016. Skull and upper cervical spine: Negative. Sinuses/Orbits: Stable since 2016 and negative. Other: Mastoids are clear. Grossly normal visible internal auditory structures. Scalp and face soft tissues appear negative. IMPRESSION: 1.  No acute intracranial abnormality. 2. Mild to moderate for age signal changes in the brain most compatible with chronic small vessel disease, mildly progressed since 2016. Electronically Signed   By: Genevie Ann M.D.   On: 01/27/2019 13:19   Dg Chest Portable 1 View  Result Date: 01/27/2019 CLINICAL DATA:  Left rib fractures as seen on the prior study. EXAM: PORTABLE CHEST 1 VIEW COMPARISON:  PA and lateral chest 10/13/2018. FINDINGS: There is cardiomegaly without edema. Lungs are clear. No pneumothorax or pleural effusion. Aortic atherosclerosis is noted. Left rib fractures are identified as seen on the prior exam. 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 IMPRESSION: Cardiomegaly without acute disease. Atherosclerosis. Remote left rib fractures.  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 Electronically Signed   By: Inge Rise M.D.   On: 01/27/2019 12:56   US Abdomen Limited Ruq  Result Date: 02/12/2019 CLINICAL DATA:  Abdominal pain with nausea and vomiting EXAM: ULTRASOUND ABDOMEN LIMITED RIGHT UPPER QUADRANT COMPARISON:  None. FINDINGS: Gallbladder: No gallstones or wall thickening visualized. No sonographic Murphy sign noted by sonographer. Common bile duct: Diameter: 3 mm Liver: Increased hepatic echogenicity compatible with hepatic steatosis. No definite focal hepatic abnormality or intrahepatic biliary dilatation. No surrounding free fluid or ascites. Portal vein is patent on color Doppler imaging with normal direction of blood flow towards the liver. IMPRESSION: Hepatic steatosis.  No other acute finding by ultrasound. Electronically Signed   By: Jerilynn Mages.  Shick M.D.   On: 02/12/2019 08:28      Assessment/Plan HTN (hypertension) blood pressure control important in reducing the progression of atherosclerotic disease. On appropriate oral medications.  PVD (peripheral vascular disease) ([place]) No new symptoms.  ABIs remain OK. Recheck in two years  Carotid stenosis Duplex today demonstrates stable 1 to 39% right ICA stenosis and a patent left carotid endarterectomy without recurrent stenosis.  Continue current medical regimen.  Recheck in 1 year.    Leotis Pain, MD  02/13/2019 4:58 PM    This note was created with Dragon medical transcription system.  Any errors from dictation are purely unintentional

## 2019-03-09 DIAGNOSIS — G8929 Other chronic pain: Secondary | ICD-10-CM | POA: Diagnosis not present

## 2019-03-09 DIAGNOSIS — E1151 Type 2 diabetes mellitus with diabetic peripheral angiopathy without gangrene: Secondary | ICD-10-CM | POA: Diagnosis not present

## 2019-03-09 DIAGNOSIS — M7121 Synovial cyst of popliteal space [Baker], right knee: Secondary | ICD-10-CM | POA: Diagnosis not present

## 2019-03-09 DIAGNOSIS — M1711 Unilateral primary osteoarthritis, right knee: Secondary | ICD-10-CM | POA: Diagnosis not present

## 2019-03-09 DIAGNOSIS — I10 Essential (primary) hypertension: Secondary | ICD-10-CM | POA: Diagnosis not present

## 2019-03-09 DIAGNOSIS — Z886 Allergy status to analgesic agent status: Secondary | ICD-10-CM | POA: Diagnosis not present

## 2019-03-09 DIAGNOSIS — Z882 Allergy status to sulfonamides status: Secondary | ICD-10-CM | POA: Diagnosis not present

## 2019-03-09 DIAGNOSIS — M25561 Pain in right knee: Secondary | ICD-10-CM | POA: Diagnosis not present

## 2019-03-09 DIAGNOSIS — E1140 Type 2 diabetes mellitus with diabetic neuropathy, unspecified: Secondary | ICD-10-CM | POA: Diagnosis not present

## 2019-03-09 DIAGNOSIS — R200 Anesthesia of skin: Secondary | ICD-10-CM | POA: Diagnosis not present

## 2019-03-09 DIAGNOSIS — M25571 Pain in right ankle and joints of right foot: Secondary | ICD-10-CM | POA: Diagnosis not present

## 2019-04-08 ENCOUNTER — Other Ambulatory Visit: Payer: Self-pay | Admitting: Oncology

## 2019-04-09 MED ORDER — PHENAZOPYRIDINE HCL 200 MG PO TABS: 200.0000 mg | ORAL_TABLET | Freq: Three times a day (TID) | ORAL | 0 refills | Status: DC | PRN

## 2019-04-23 ENCOUNTER — Telehealth: Payer: Self-pay | Admitting: *Deleted

## 2019-04-23 NOTE — Telephone Encounter (Signed)
Patient called requesting to move her appointment from May 12th up stating she is very weak and fatigued like she gets when he blood is low. Please advise

## 2019-04-23 NOTE — Telephone Encounter (Signed)
I attempted to return call to patient, but got voice mail Left message to call back to schedule appt

## 2019-04-23 NOTE — Telephone Encounter (Signed)
Patient can be scheduled for lab only visit this week, further f/u tbd based on results.

## 2019-04-23 NOTE — Telephone Encounter (Signed)
Patient called back stating accepts appointment for this week. I left message n voice mail that appt tomorrow at [phone]

## 2019-04-24 ENCOUNTER — Inpatient Hospital Stay: Payer: Medicare HMO | Attending: Oncology

## 2019-04-24 ENCOUNTER — Telehealth: Payer: Self-pay | Admitting: *Deleted

## 2019-04-24 ENCOUNTER — Other Ambulatory Visit: Payer: Self-pay

## 2019-04-24 DIAGNOSIS — E559 Vitamin D deficiency, unspecified: Secondary | ICD-10-CM | POA: Diagnosis not present

## 2019-04-24 DIAGNOSIS — D509 Iron deficiency anemia, unspecified: Secondary | ICD-10-CM

## 2019-04-24 DIAGNOSIS — E1129 Type 2 diabetes mellitus with other diabetic kidney complication: Secondary | ICD-10-CM | POA: Diagnosis not present

## 2019-04-24 DIAGNOSIS — R809 Proteinuria, unspecified: Secondary | ICD-10-CM | POA: Diagnosis not present

## 2019-04-24 DIAGNOSIS — D500 Iron deficiency anemia secondary to blood loss (chronic): Secondary | ICD-10-CM | POA: Diagnosis not present

## 2019-04-24 DIAGNOSIS — Z794 Long term (current) use of insulin: Secondary | ICD-10-CM | POA: Diagnosis not present

## 2019-04-24 LAB — FERRITIN: Ferritin: 41 ng/mL (ref 11–307)

## 2019-04-24 LAB — CBC WITH DIFFERENTIAL/PLATELET
Abs Immature Granulocytes: 0.01 10*3/uL (ref 0.00–0.07)
Basophils Absolute: 0.1 10*3/uL (ref 0.0–0.1)
Basophils Relative: 1 %
Eosinophils Absolute: 0.4 10*3/uL (ref 0.0–0.5)
Eosinophils Relative: 7 %
HCT: 38.9 % (ref 36.0–46.0)
Hemoglobin: 12.7 g/dL (ref 12.0–15.0)
Immature Granulocytes: 0 %
Lymphocytes Relative: 31 %
Lymphs Abs: 1.9 10*3/uL (ref 0.7–4.0)
MCH: 28.5 pg (ref 26.0–34.0)
MCHC: 32.6 g/dL (ref 30.0–36.0)
MCV: 87.2 fL (ref 80.0–100.0)
Monocytes Absolute: 0.6 10*3/uL (ref 0.1–1.0)
Monocytes Relative: 10 %
Neutro Abs: 3.1 10*3/uL (ref 1.7–7.7)
Neutrophils Relative %: 51 %
Platelets: 291 10*3/uL (ref 150–400)
RBC: 4.46 MIL/uL (ref 3.87–5.11)
RDW: 12.7 % (ref 11.5–15.5)
WBC: 6.1 10*3/uL (ref 4.0–10.5)
nRBC: 0 % (ref 0.0–0.2)

## 2019-04-24 LAB — IRON AND TIBC
Iron: 68 ug/dL (ref 28–170)
Saturation Ratios: 22 % (ref 10.4–31.8)
TIBC: 305 ug/dL (ref 250–450)
UIBC: 237 ug/dL

## 2019-04-24 LAB — SAMPLE TO BLOOD BANK

## 2019-04-24 NOTE — Telephone Encounter (Signed)
Left vm for patient regarding lab results. Per Dr. Grayland Ormond all labs were normal and patient does not need iron or transfusion at this time. Patient may have webex/doximity visit set up with Dr. Grayland Ormond if she desires.

## 2019-04-25 ENCOUNTER — Telehealth: Payer: Self-pay | Admitting: *Deleted

## 2019-04-25 NOTE — Telephone Encounter (Signed)
P called and states she missed a call from [place] regarding results and she wanted a call back. I returned her call and left a voice mail that she does not need iron or transfusion and offered a web visit. And to call back if she wants a visit

## 2019-04-30 DIAGNOSIS — Z794 Long term (current) use of insulin: Secondary | ICD-10-CM | POA: Diagnosis not present

## 2019-04-30 DIAGNOSIS — M810 Age-related osteoporosis without current pathological fracture: Secondary | ICD-10-CM | POA: Insufficient documentation

## 2019-04-30 DIAGNOSIS — R809 Proteinuria, unspecified: Secondary | ICD-10-CM | POA: Diagnosis not present

## 2019-04-30 DIAGNOSIS — E1142 Type 2 diabetes mellitus with diabetic polyneuropathy: Secondary | ICD-10-CM | POA: Diagnosis not present

## 2019-04-30 DIAGNOSIS — I10 Essential (primary) hypertension: Secondary | ICD-10-CM | POA: Diagnosis not present

## 2019-04-30 DIAGNOSIS — E1129 Type 2 diabetes mellitus with other diabetic kidney complication: Secondary | ICD-10-CM | POA: Diagnosis not present

## 2019-04-30 DIAGNOSIS — E1165 Type 2 diabetes mellitus with hyperglycemia: Secondary | ICD-10-CM | POA: Insufficient documentation

## 2019-04-30 DIAGNOSIS — E1159 Type 2 diabetes mellitus with other circulatory complications: Secondary | ICD-10-CM | POA: Diagnosis not present

## 2019-05-08 ENCOUNTER — Ambulatory Visit: Payer: Medicare HMO

## 2019-05-08 ENCOUNTER — Ambulatory Visit: Payer: Medicare HMO | Admitting: Oncology

## 2019-05-08 ENCOUNTER — Other Ambulatory Visit: Payer: Medicare HMO

## 2019-05-14 ENCOUNTER — Encounter: Payer: Self-pay | Admitting: Emergency Medicine

## 2019-05-14 ENCOUNTER — Emergency Department
Admission: EM | Admit: 2019-05-14 | Discharge: 2019-05-14 | Disposition: A | Discharge status: Home / Self Care | Payer: Medicare HMO | Attending: Emergency Medicine | Admitting: Emergency Medicine

## 2019-05-14 ENCOUNTER — Emergency Department: Payer: Medicare HMO

## 2019-05-14 ENCOUNTER — Other Ambulatory Visit: Payer: Self-pay

## 2019-05-14 DIAGNOSIS — Z794 Long term (current) use of insulin: Secondary | ICD-10-CM | POA: Insufficient documentation

## 2019-05-14 DIAGNOSIS — M25561 Pain in right knee: Secondary | ICD-10-CM | POA: Insufficient documentation

## 2019-05-14 DIAGNOSIS — S8991XA Unspecified injury of right lower leg, initial encounter: Secondary | ICD-10-CM | POA: Diagnosis not present

## 2019-05-14 DIAGNOSIS — Z79899 Other long term (current) drug therapy: Secondary | ICD-10-CM | POA: Insufficient documentation

## 2019-05-14 DIAGNOSIS — I2510 Atherosclerotic heart disease of native coronary artery without angina pectoris: Secondary | ICD-10-CM | POA: Diagnosis not present

## 2019-05-14 DIAGNOSIS — Z85828 Personal history of other malignant neoplasm of skin: Secondary | ICD-10-CM | POA: Insufficient documentation

## 2019-05-14 DIAGNOSIS — I10 Essential (primary) hypertension: Secondary | ICD-10-CM | POA: Diagnosis not present

## 2019-05-14 DIAGNOSIS — Z87891 Personal history of nicotine dependence: Secondary | ICD-10-CM | POA: Insufficient documentation

## 2019-05-14 MED ORDER — DICLOFENAC SODIUM 1 % TD GEL: 4.0000 g | Freq: Four times a day (QID) | TRANSDERMAL | 0 refills | Status: DC

## 2019-05-14 MED ORDER — OXYCODONE-ACETAMINOPHEN 5-325 MG PO TABS
1.0000 | ORAL_TABLET | Freq: Once | ORAL | Status: DC
  Filled 2019-05-14: qty 1

## 2019-05-14 MED ORDER — TRAMADOL HCL 50 MG PO TABS
50.0000 mg | ORAL_TABLET | Freq: Once | ORAL | Status: AC
  Administered 2019-05-14: 50 mg via ORAL
  Filled 2019-05-14: qty 1

## 2019-05-14 MED ORDER — TRAMADOL HCL 50 MG PO TABS: 50.0000 mg | ORAL_TABLET | Freq: Four times a day (QID) | ORAL | 0 refills | Status: DC | PRN

## 2019-05-14 NOTE — ED Provider Notes (Signed)
[facility] Emergency Department Provider Note  ____________________________________________   First MD Initiated Contact with Patient 05/14/19 1411     (approximate)  I have reviewed the triage vital signs and the nursing notes.   HISTORY  Chief Complaint Knee Injury    HPI Joyce Potter Joyce a 73 y.o. Potter presents emergency department stating she fell on Saturday.  She has been complaining of pain to the right knee.  She has not been able to bear weight without pain.  She denies any hip pain or low back pain.    Past Medical History:  Diagnosis Date  . Anemia    iron deficiency  . Aortic stenosis   . Basal cell carcinoma   . CAD (coronary artery disease)    s/p Left circumflex stent in 2012  . Carotid stenosis   . Cataract   . High cholesterol   . HTN (hypertension)   . Hyperlipidemia   . IDDM (insulin dependent diabetes mellitus) ([place])   . Multiple thyroid nodules    Benign  . Peptic ulcer disease   . Retinal artery occlusion    on left    Patient Active Problem List   Diagnosis Date Noted  . Multiple rib fractures 10/11/2018  . GIB (gastrointestinal bleeding) 07/17/2018  . Melena   . Symptomatic anemia   . PVD (peripheral vascular disease) ([place]) 02/11/2017  . Iron deficiency anemia due to chronic blood loss 07/13/2016  . Central retinal artery occlusion 10/01/2015  . Carotid stenosis 10/01/2015  . Stroke ([place]) 09/28/2015  . GI bleed from an occult source with recurrent chronic blood loss anemia 04/14/2013  . CAD (coronary artery disease) with demand ischemia from anemia 04/14/2013  . IDDM (insulin dependent diabetes mellitus) ([place]) 04/14/2013  . HTN (hypertension) 04/14/2013  . Chest pain 04/14/2013    Past Surgical History:  Procedure Laterality Date  . ABDOMINAL HYSTERECTOMY    . ARTERY BIOPSY Left 11/19/2015   Procedure: BIOPSY TEMPORAL ARTERY;  Surgeon: Algernon Huxley, MD;  Location: [facility] ORS;  Service: Vascular;   Laterality: Left;  . BREAST BIOPSY Left 2002   core- neg  . CARDIAC CATHETERIZATION N/A 08/08/2015   Procedure: Right and Left Heart Cath and Coronary Angiography;  Surgeon: Teodoro Spray, MD;  Location: [place] [facility];  Service: Cardiovascular;  Laterality: N/A;  . CARDIAC CATHETERIZATION Bilateral 09/03/2016   Procedure: Right/Left Heart Cath and Coronary Angiography;  Surgeon: Teodoro Spray, MD;  Location: [place] [facility];  Service: Cardiovascular;  Laterality: Bilateral;  . CATARACT EXTRACTION W/ INTRAOCULAR LENS  IMPLANT, BILATERAL    . COLONOSCOPY     in 2013- internal hemorrhoids  . COLONOSCOPY WITH PROPOFOL N/A 07/07/2018   Procedure: COLONOSCOPY WITH PROPOFOL;  Surgeon: Manya Silvas, MD;  Location: [facility] ENDOSCOPY;  Service: Endoscopy;  Laterality: N/A;  . CORONARY ANGIOGRAPHY N/A 09/14/2017   Procedure: CORONARY ANGIOGRAPHY;  Surgeon: Teodoro Spray, MD;  Location: [place] [facility];  Service: Cardiovascular;  Laterality: N/A;  . CORONARY ANGIOPLASTY    . CORONARY STENT PLACEMENT    . ENDARTERECTOMY Left 10/01/2015   Procedure: ENDARTERECTOMY CAROTID;  Surgeon: Algernon Huxley, MD;  Location: [facility] ORS;  Service: Vascular;  Laterality: Left;  . ESOPHAGOGASTRODUODENOSCOPY    . ESOPHAGOGASTRODUODENOSCOPY (EGD) WITH PROPOFOL N/A 05/31/2016   Procedure: ESOPHAGOGASTRODUODENOSCOPY (EGD) WITH PROPOFOL;  Surgeon: Manya Silvas, MD;  Location: [facility] ENDOSCOPY;  Service: Endoscopy;  Laterality: N/A;  . ESOPHAGOGASTRODUODENOSCOPY (EGD) WITH PROPOFOL N/A 07/07/2018  Procedure: ESOPHAGOGASTRODUODENOSCOPY (EGD) WITH PROPOFOL;  Surgeon: Manya Silvas, MD;  Location: [facility] ENDOSCOPY;  Service: Endoscopy;  Laterality: N/A;  . ESOPHAGOGASTRODUODENOSCOPY (EGD) WITH PROPOFOL N/A 07/17/2018   Procedure: ESOPHAGOGASTRODUODENOSCOPY (EGD) WITH PROPOFOL;  Surgeon: Lucilla Lame, MD;  Location: [facility] ENDOSCOPY;  Service: Endoscopy;  Laterality: N/A;  . EYE SURGERY    . GIVENS CAPSULE STUDY N/A  04/17/2013   Procedure: GIVENS CAPSULE STUDY;  Surgeon: Arta Silence, MD;  Location: [facility] ENDOSCOPY;  Service: Endoscopy;  Laterality: N/A;  patient ate breakfast at 7am   . PARTIAL HYSTERECTOMY    . RIGHT AND LEFT HEART CATH Bilateral 09/14/2017   Procedure: RIGHT AND LEFT HEART CATH;  Surgeon: Teodoro Spray, MD;  Location: [place] [facility];  Service: Cardiovascular;  Laterality: Bilateral;  . TRIGGER FINGER RELEASE      Prior to Admission medications   Medication Sig Start Date End Date Taking? Authorizing Provider  acetaminophen (TYLENOL) 325 MG tablet Take 2 tablets (650 mg total) by mouth every 6 (six) hours as needed for mild pain (or Fever >/= 101). 10/12/18   Vaughan Basta, MD  albuterol (PROAIR HFA) 108 (90 Base) MCG/ACT inhaler Inhale 2 puffs into the lungs every 4 (four) hours as needed for wheezing or shortness of breath.     [provider]  alendronate (FOSAMAX) 70 MG tablet Take 70 mg by mouth every Tuesday.     [provider]  bisacodyl (DULCOLAX) 5 MG EC tablet Take 1 tablet (5 mg total) by mouth daily as needed for moderate constipation. 10/12/18   Vaughan Basta, MD  diclofenac sodium (VOLTAREN) 1 % GEL Apply 4 g topically 4 (four) times daily. 05/14/19   Preslee Regas, Linden Dolin, PA-C  Emollient (CERAVE) CREA Apply 1 application topically daily as needed (for dry skin).     [provider]  insulin NPH-regular Human (NOVOLIN 70/30) (70-30) 100 UNIT/ML injection Inject 60-70 Units into the skin See admin instructions. 80 units every morning and 60 units every afternoon    [provider]  losartan (COZAAR) 25 MG tablet Take 25 mg by mouth daily.    [provider]  metoprolol tartrate (LOPRESSOR) 50 MG tablet Take 2 tablets (100 mg total) by mouth 2 (two) times daily. 07/18/18   Salary, Avel Peace, MD  pantoprazole (PROTONIX) 40 MG tablet Take 1 tablet (40 mg total) by mouth daily. 01/27/19 01/27/20  Harvest Dark, MD   phenazopyridine (PYRIDIUM) 200 MG tablet Take 1 tablet (200 mg total) by mouth 3 (three) times daily as needed for pain. 04/09/19   Lloyd Huger, MD  pravastatin (PRAVACHOL) 10 MG tablet Take 1 tablet (10 mg total) by mouth daily at 6 PM. 09/29/15   Bettey Costa, MD  tiotropium (SPIRIVA HANDIHALER) 18 MCG inhalation capsule Place 18 mcg into inhaler and inhale daily.  09/22/17 02/07/19  [provider]  traMADol (ULTRAM) 50 MG tablet Take 1 tablet (50 mg total) by mouth every 6 (six) hours as needed. 05/14/19   Versie Starks, PA-C    Allergies Aspirin; Invokamet [canagliflozin-metformin hcl]; Sulfa antibiotics; Lovastatin; Penicillins; and Vicodin [hydrocodone-acetaminophen]  Family History  Problem Relation Age of Onset  . Uterine cancer Mother   . Breast cancer Mother 64  . Seizures Father   . Stroke Father   . Diabetes Father   . COPD Father   . Colon cancer Neg Hx     Social History Social History   Tobacco Use  . Smoking status: Former Smoker  Last attempt to quit: 02/15/1985    Years since quitting: 34.2  . Smokeless tobacco: Never Used  . Tobacco comment: quit about 31 years ago  Substance Use Topics  . Alcohol use: No    Alcohol/week: 0.0 standard drinks  . Drug use: No    Review of Systems  Constitutional: No fever/chills Eyes: No visual changes. ENT: No sore throat. Respiratory: Denies cough Genitourinary: Negative for dysuria. Musculoskeletal: Negative for back pain.  Positive for right knee pain Skin: Negative for rash.    ____________________________________________   PHYSICAL EXAM:  VITAL SIGNS: ED Triage Vitals  Enc Vitals Group     BP 05/14/19 1335 (!) 143/106     Pulse Rate 05/14/19 1335 64     Resp --      Temp 05/14/19 1335 98.2 F (36.8 C)     Temp Source 05/14/19 1335 Oral     SpO2 05/14/19 1335 97 %     Weight 05/14/19 1336 208 lb (94.3 kg)     Height 05/14/19 1336 5\' 3"  (1.6 m)     Head Circumference --      Peak  Flow --      Pain Score 05/14/19 1340 0     Pain Loc --      Pain Edu? --      Excl. in [place]? --     Constitutional: Alert and oriented. Well appearing and in no acute distress. Eyes: Conjunctivae are normal.  Head: Atraumatic. Nose: No congestion/rhinnorhea. Mouth/Throat: Mucous membranes are moist.   Neck:  supple no lymphadenopathy noted Cardiovascular: Normal rate, regular rhythm.  Respiratory: Normal respiratory effort.  No retractions GU: deferred Musculoskeletal: FROM all extremities, warm and well perfused, right knee Joyce tender at the joint line and medial aspect, neurovascular Joyce intact, hip Joyce not tender Neurologic:  Normal speech and language.  Skin:  Skin Joyce warm, dry and intact. No rash noted. Psychiatric: Mood and affect are normal. Speech and behavior are normal.  ____________________________________________   LABS (all labs ordered are listed, but only abnormal results are displayed)  Labs Reviewed - No data to display ____________________________________________   ____________________________________________  RADIOLOGY  X-ray of the right knee Joyce negative for fracture  ____________________________________________   PROCEDURES  Procedure(s) performed: Ronnald Ramp wrap applied by nursing staff   Procedures    ____________________________________________   INITIAL IMPRESSION / ASSESSMENT AND PLAN / ED COURSE  Pertinent labs & imaging results that were available during my care of the patient were reviewed by me and considered in my medical decision making (see chart for details).   Patient Joyce a 73 year old Potter presents emergency department with complaints of right knee pain after a fall.  Physical exam shows the right knee to be tender at the joint line and medial aspect.  X-ray of the right knee Joyce negative  Explained findings to the patient.  She was placed in a [place] wrap.  She Joyce given tramadol while here in the ED.  Prescription for tramadol  and Voltaren gel as she Joyce not to take oral NSAIDs.  She Joyce to follow-up with Dr. Rudene Christians who Joyce her regular orthopedist.  She states she understands will comply.  She was discharged stable condition.     As part of my medical decision making, I reviewed the following data within the [place] notes reviewed and incorporated, Old chart reviewed, Radiograph reviewed x-ray of the right knee Joyce negative for fracture, Notes from prior ED visits and [place]  Controlled Substance Database  ____________________________________________   FINAL CLINICAL IMPRESSION(S) / ED DIAGNOSES  Final diagnoses:  Acute pain of right knee      NEW MEDICATIONS STARTED DURING THIS VISIT:  New Prescriptions   DICLOFENAC SODIUM (VOLTAREN) 1 % GEL    Apply 4 g topically 4 (four) times daily.   TRAMADOL (ULTRAM) 50 MG TABLET    Take 1 tablet (50 mg total) by mouth every 6 (six) hours as needed.     Note:  This document was prepared using Dragon voice recognition software and may include unintentional dictation errors.    Versie Starks, PA-C 05/14/19 1523    Earleen Newport, MD 05/14/19 [phone]

## 2019-05-14 NOTE — ED Notes (Signed)
See triage note  States she fell on Saturday   having pain to right knee   States she is not able to bear wt  Min swelling noted

## 2019-05-14 NOTE — ED Triage Notes (Signed)
States fell a few days (mechanical) ago and landed on knees.  Arrives today with c/o right knee pain.  Pain worse with weight bearing and turning action.

## 2019-05-14 NOTE — Discharge Instructions (Addendum)
Follow-up with Dr. Rudene Christians if you are not better in 5 to 7 days.  Return emergency department worsening.  Take pain medication as prescribed.  Apply the gel to the knees twice daily.  Return emergency department as needed.

## 2019-05-28 DIAGNOSIS — M1711 Unilateral primary osteoarthritis, right knee: Secondary | ICD-10-CM | POA: Diagnosis not present

## 2019-05-30 DIAGNOSIS — R300 Dysuria: Secondary | ICD-10-CM | POA: Diagnosis not present

## 2019-06-04 DIAGNOSIS — H34233 Retinal artery branch occlusion, bilateral: Secondary | ICD-10-CM | POA: Diagnosis not present

## 2019-06-06 DIAGNOSIS — E113299 Type 2 diabetes mellitus with mild nonproliferative diabetic retinopathy without macular edema, unspecified eye: Secondary | ICD-10-CM | POA: Insufficient documentation

## 2019-06-14 DIAGNOSIS — M1711 Unilateral primary osteoarthritis, right knee: Secondary | ICD-10-CM | POA: Diagnosis not present

## 2019-07-26 ENCOUNTER — Other Ambulatory Visit: Payer: Self-pay

## 2019-07-26 ENCOUNTER — Emergency Department
Admission: EM | Admit: 2019-07-26 | Discharge: 2019-07-26 | Disposition: A | Discharge status: Home / Self Care | Payer: Medicare HMO | Attending: Emergency Medicine | Admitting: Emergency Medicine

## 2019-07-26 ENCOUNTER — Encounter: Payer: Self-pay | Admitting: Emergency Medicine

## 2019-07-26 ENCOUNTER — Emergency Department: Payer: Medicare HMO

## 2019-07-26 DIAGNOSIS — R1012 Left upper quadrant pain: Secondary | ICD-10-CM | POA: Insufficient documentation

## 2019-07-26 DIAGNOSIS — E119 Type 2 diabetes mellitus without complications: Secondary | ICD-10-CM | POA: Diagnosis not present

## 2019-07-26 DIAGNOSIS — Z79899 Other long term (current) drug therapy: Secondary | ICD-10-CM | POA: Diagnosis not present

## 2019-07-26 DIAGNOSIS — I10 Essential (primary) hypertension: Secondary | ICD-10-CM | POA: Diagnosis not present

## 2019-07-26 DIAGNOSIS — R1032 Left lower quadrant pain: Secondary | ICD-10-CM | POA: Diagnosis not present

## 2019-07-26 DIAGNOSIS — Z87891 Personal history of nicotine dependence: Secondary | ICD-10-CM | POA: Insufficient documentation

## 2019-07-26 DIAGNOSIS — R1011 Right upper quadrant pain: Secondary | ICD-10-CM | POA: Diagnosis not present

## 2019-07-26 DIAGNOSIS — K760 Fatty (change of) liver, not elsewhere classified: Secondary | ICD-10-CM | POA: Diagnosis not present

## 2019-07-26 DIAGNOSIS — Z794 Long term (current) use of insulin: Secondary | ICD-10-CM | POA: Diagnosis not present

## 2019-07-26 DIAGNOSIS — R0781 Pleurodynia: Secondary | ICD-10-CM | POA: Diagnosis not present

## 2019-07-26 DIAGNOSIS — I2510 Atherosclerotic heart disease of native coronary artery without angina pectoris: Secondary | ICD-10-CM | POA: Diagnosis not present

## 2019-07-26 DIAGNOSIS — Z955 Presence of coronary angioplasty implant and graft: Secondary | ICD-10-CM | POA: Diagnosis not present

## 2019-07-26 DIAGNOSIS — Z85828 Personal history of other malignant neoplasm of skin: Secondary | ICD-10-CM | POA: Insufficient documentation

## 2019-07-26 DIAGNOSIS — Z8673 Personal history of transient ischemic attack (TIA), and cerebral infarction without residual deficits: Secondary | ICD-10-CM | POA: Insufficient documentation

## 2019-07-26 LAB — COMPREHENSIVE METABOLIC PANEL
ALT: 29 U/L (ref 0–44)
AST: 27 U/L (ref 15–41)
Albumin: 4.2 g/dL (ref 3.5–5.0)
Alkaline Phosphatase: 89 U/L (ref 38–126)
Anion gap: 10 (ref 5–15)
BUN: 19 mg/dL (ref 8–23)
CO2: 27 mmol/L (ref 22–32)
Calcium: 9.5 mg/dL (ref 8.9–10.3)
Chloride: 97 mmol/L — ABNORMAL LOW (ref 98–111)
Creatinine, Ser: 0.83 mg/dL (ref 0.44–1.00)
GFR calc Af Amer: >60 mL/min (ref >60)
GFR calc non Af Amer: >60 mL/min (ref >60)
Glucose, Bld: 349 mg/dL — ABNORMAL HIGH (ref 70–99)
Potassium: 4.2 mmol/L (ref 3.5–5.1)
Sodium: 134 mmol/L — ABNORMAL LOW (ref 135–145)
Total Bilirubin: 0.8 mg/dL (ref 0.3–1.2)
Total Protein: 7.9 g/dL (ref 6.5–8.1)

## 2019-07-26 LAB — URINALYSIS, COMPLETE (UACMP) WITH MICROSCOPIC
Bacteria, UA: NONE SEEN
Bilirubin Urine: NEGATIVE
Glucose, UA: >=500 mg/dL — AB
Hgb urine dipstick: NEGATIVE
Ketones, ur: 5 mg/dL — AB
Leukocytes,Ua: NEGATIVE
Nitrite: NEGATIVE
Protein, ur: 100 mg/dL — AB
Specific Gravity, Urine: 1.031 — ABNORMAL HIGH (ref 1.005–1.030)
pH: 5 (ref 5.0–8.0)

## 2019-07-26 LAB — CBC
HCT: 42 % (ref 36.0–46.0)
Hemoglobin: 13.8 g/dL (ref 12.0–15.0)
MCH: 28.5 pg (ref 26.0–34.0)
MCHC: 32.9 g/dL (ref 30.0–36.0)
MCV: 86.6 fL (ref 80.0–100.0)
Platelets: 317 10*3/uL (ref 150–400)
RBC: 4.85 MIL/uL (ref 3.87–5.11)
RDW: 12.3 % (ref 11.5–15.5)
WBC: 6.5 10*3/uL (ref 4.0–10.5)
nRBC: 0 % (ref 0.0–0.2)

## 2019-07-26 LAB — TROPONIN I (HIGH SENSITIVITY)
Troponin I (High Sensitivity): 11 ng/L (ref <18)
Troponin I (High Sensitivity): 12 ng/L (ref <18)

## 2019-07-26 LAB — LIPASE, BLOOD: Lipase: 42 U/L (ref 11–51)

## 2019-07-26 MED ORDER — IBUPROFEN 400 MG PO TABS: 400.0000 mg | ORAL_TABLET | Freq: Once | ORAL | Status: DC

## 2019-07-26 MED ORDER — ACETAMINOPHEN 325 MG PO TABS
650.0000 mg | ORAL_TABLET | Freq: Once | ORAL | Status: AC
  Administered 2019-07-26: 20:00:00 650 mg via ORAL
  Filled 2019-07-26: qty 2

## 2019-07-26 MED ORDER — SODIUM CHLORIDE 0.9 % IV BOLUS
1000.0000 mL | Freq: Once | INTRAVENOUS | Status: AC
  Administered 2019-07-26: 19:00:00 1000 mL via INTRAVENOUS

## 2019-07-26 MED ORDER — IOHEXOL 350 MG/ML SOLN
75.0000 mL | Freq: Once | INTRAVENOUS | Status: AC | PRN
  Administered 2019-07-26: 18:00:00 75 mL via INTRAVENOUS

## 2019-07-26 NOTE — ED Provider Notes (Signed)
[facility] Emergency Department Provider Note  ____________________________________________   First MD Initiated Contact with Patient 07/26/19 1712     (approximate)  I have reviewed the triage vital signs and the nursing notes.   HISTORY  Chief Complaint Abdominal Pain    HPI Joyce Potter is a 73 y.o. female with hypertension, hyperlipidemia, diabetes who presents with abdominal pain.  Abdominal pain started 2 days ago.  Patient says the abdominal pain has been sharp, constant, nothing makes it better or worse.  She endorses the pain is both in her right lower quadrant and right upper quadrant.  Patient also has pain with breathing.  She denies however overt shortness of breath.  No fevers.  No vomiting.  Has had chronic diarrhea that is been going on for weeks.  Patient does have a history of diabetes and had a recent knee injection which is why her sugars are high.          Past Medical History:  Diagnosis Date   Anemia    iron deficiency   Aortic stenosis    Basal cell carcinoma    CAD (coronary artery disease)    s/p Left circumflex stent in 2012   Carotid stenosis    Cataract    High cholesterol    HTN (hypertension)    Hyperlipidemia    IDDM (insulin dependent diabetes mellitus) ([place])    Multiple thyroid nodules    Benign   Peptic ulcer disease    Retinal artery occlusion    on left    Patient Active Problem List   Diagnosis Date Noted   Multiple rib fractures 10/11/2018   GIB (gastrointestinal bleeding) 07/17/2018   Melena    Symptomatic anemia    PVD (peripheral vascular disease) ([place]) 02/11/2017   Iron deficiency anemia due to chronic blood loss 07/13/2016   Central retinal artery occlusion 10/01/2015   Carotid stenosis 10/01/2015   Stroke ([place]) 09/28/2015   GI bleed from an occult source with recurrent chronic blood loss anemia 04/14/2013   CAD (coronary artery disease) with demand ischemia  from anemia 04/14/2013   IDDM (insulin dependent diabetes mellitus) ([place]) 04/14/2013   HTN (hypertension) 04/14/2013   Chest pain 04/14/2013    Past Surgical History:  Procedure Laterality Date   ABDOMINAL HYSTERECTOMY     ARTERY BIOPSY Left 11/19/2015   Procedure: BIOPSY TEMPORAL ARTERY;  Surgeon: Algernon Huxley, MD;  Location: [facility] ORS;  Service: Vascular;  Laterality: Left;   BREAST BIOPSY Left 2002   core- neg   CARDIAC CATHETERIZATION N/A 08/08/2015   Procedure: Right and Left Heart Cath and Coronary Angiography;  Surgeon: Teodoro Spray, MD;  Location: [place] [facility];  Service: Cardiovascular;  Laterality: N/A;   CARDIAC CATHETERIZATION Bilateral 09/03/2016   Procedure: Right/Left Heart Cath and Coronary Angiography;  Surgeon: Teodoro Spray, MD;  Location: [place] [facility];  Service: Cardiovascular;  Laterality: Bilateral;   CATARACT EXTRACTION W/ INTRAOCULAR LENS  IMPLANT, BILATERAL     COLONOSCOPY     in 2013- internal hemorrhoids   COLONOSCOPY WITH PROPOFOL N/A 07/07/2018   Procedure: COLONOSCOPY WITH PROPOFOL;  Surgeon: Manya Silvas, MD;  Location: [facility] ENDOSCOPY;  Service: Endoscopy;  Laterality: N/A;   CORONARY ANGIOGRAPHY N/A 09/14/2017   Procedure: CORONARY ANGIOGRAPHY;  Surgeon: Teodoro Spray, MD;  Location: [place] [facility];  Service: Cardiovascular;  Laterality: N/A;   CORONARY ANGIOPLASTY     CORONARY STENT PLACEMENT  ENDARTERECTOMY Left 10/01/2015   Procedure: ENDARTERECTOMY CAROTID;  Surgeon: Algernon Huxley, MD;  Location: [facility] ORS;  Service: Vascular;  Laterality: Left;   ESOPHAGOGASTRODUODENOSCOPY     ESOPHAGOGASTRODUODENOSCOPY (EGD) WITH PROPOFOL N/A 05/31/2016   Procedure: ESOPHAGOGASTRODUODENOSCOPY (EGD) WITH PROPOFOL;  Surgeon: Manya Silvas, MD;  Location: [facility] ENDOSCOPY;  Service: Endoscopy;  Laterality: N/A;   ESOPHAGOGASTRODUODENOSCOPY (EGD) WITH PROPOFOL N/A 07/07/2018   Procedure: ESOPHAGOGASTRODUODENOSCOPY (EGD) WITH  PROPOFOL;  Surgeon: Manya Silvas, MD;  Location: [facility] ENDOSCOPY;  Service: Endoscopy;  Laterality: N/A;   ESOPHAGOGASTRODUODENOSCOPY (EGD) WITH PROPOFOL N/A 07/17/2018   Procedure: ESOPHAGOGASTRODUODENOSCOPY (EGD) WITH PROPOFOL;  Surgeon: Lucilla Lame, MD;  Location: [facility] ENDOSCOPY;  Service: Endoscopy;  Laterality: N/A;   EYE SURGERY     GIVENS CAPSULE STUDY N/A 04/17/2013   Procedure: GIVENS CAPSULE STUDY;  Surgeon: Arta Silence, MD;  Location: [facility] ENDOSCOPY;  Service: Endoscopy;  Laterality: N/A;  patient ate breakfast at 7am    PARTIAL HYSTERECTOMY     RIGHT AND LEFT HEART CATH Bilateral 09/14/2017   Procedure: RIGHT AND LEFT HEART CATH;  Surgeon: Teodoro Spray, MD;  Location: [place] [facility];  Service: Cardiovascular;  Laterality: Bilateral;   TRIGGER FINGER RELEASE      Prior to Admission medications   Medication Sig Start Date End Date Taking? Authorizing Provider  acetaminophen (TYLENOL) 325 MG tablet Take 2 tablets (650 mg total) by mouth every 6 (six) hours as needed for mild pain (or Fever >/= 101). 10/12/18   Vaughan Basta, MD  albuterol (PROAIR HFA) 108 (90 Base) MCG/ACT inhaler Inhale 2 puffs into the lungs every 4 (four) hours as needed for wheezing or shortness of breath.     [provider]  alendronate (FOSAMAX) 70 MG tablet Take 70 mg by mouth every Tuesday.     [provider]  bisacodyl (DULCOLAX) 5 MG EC tablet Take 1 tablet (5 mg total) by mouth daily as needed for moderate constipation. 10/12/18   Vaughan Basta, MD  diclofenac sodium (VOLTAREN) 1 % GEL Apply 4 g topically 4 (four) times daily. 05/14/19   Fisher, Linden Dolin, PA-C  Emollient (CERAVE) CREA Apply 1 application topically daily as needed (for dry skin).     [provider]  insulin NPH-regular Human (NOVOLIN 70/30) (70-30) 100 UNIT/ML injection Inject 60-70 Units into the skin See admin instructions. 80 units every morning and 60 units every afternoon     [provider]  losartan (COZAAR) 25 MG tablet Take 25 mg by mouth daily.    [provider]  metoprolol tartrate (LOPRESSOR) 50 MG tablet Take 2 tablets (100 mg total) by mouth 2 (two) times daily. 07/18/18   Salary, Avel Peace, MD  pantoprazole (PROTONIX) 40 MG tablet Take 1 tablet (40 mg total) by mouth daily. 01/27/19 01/27/20  Harvest Dark, MD  phenazopyridine (PYRIDIUM) 200 MG tablet Take 1 tablet (200 mg total) by mouth 3 (three) times daily as needed for pain. 04/09/19   Lloyd Huger, MD  pravastatin (PRAVACHOL) 10 MG tablet Take 1 tablet (10 mg total) by mouth daily at 6 PM. 09/29/15   Bettey Costa, MD  tiotropium (SPIRIVA HANDIHALER) 18 MCG inhalation capsule Place 18 mcg into inhaler and inhale daily.  09/22/17 02/07/19  [provider]  traMADol (ULTRAM) 50 MG tablet Take 1 tablet (50 mg total) by mouth every 6 (six) hours as needed. 05/14/19   Caryn Section Linden Dolin, PA-C    Allergies Aspirin, Invokamet [canagliflozin-metformin hcl], Sulfa antibiotics, Lovastatin,  Penicillins, and Vicodin [hydrocodone-acetaminophen]  Family History  Problem Relation Age of Onset   Uterine cancer Mother    Breast cancer Mother 33   Seizures Father    Stroke Father    Diabetes Father    COPD Father    Colon cancer Neg Hx     Social History Social History   Tobacco Use   Smoking status: Former Smoker    Quit date: 02/15/1985    Years since quitting: 34.4   Smokeless tobacco: Never Used   Tobacco comment: quit about 31 years ago  Substance Use Topics   Alcohol use: No    Alcohol/week: 0.0 standard drinks   Drug use: No      Review of Systems Constitutional: No fever/chills Eyes: No visual changes. ENT: No sore throat. Cardiovascular: Denies chest pain. Respiratory: Positive pleuritic chest pain Gastrointestinal: Positive abdominal pain no nausea, no vomiting.  No diarrhea.  No constipation. Genitourinary: Negative for  dysuria. Musculoskeletal: Negative for back pain. Skin: Negative for rash. Neurological: Negative for headaches, focal weakness or numbness. All other ROS negative ____________________________________________   PHYSICAL EXAM:  VITAL SIGNS: ED Triage Vitals [07/26/19 1536]  Enc Vitals Group     BP (!) 179/88     Pulse Rate (!) 110     Resp 20     Temp 98.1 F (36.7 C)     Temp src      SpO2 96 %     Weight 189 lb (85.7 kg)     Height 5\' 3"  (1.6 m)     Head Circumference      Peak Flow      Pain Score 9     Pain Loc      Pain Edu?      Excl. in [place]?     Constitutional: Alert and oriented. Well appearing and in no acute distress. Eyes: Conjunctivae are normal. EOMI. Head: Atraumatic. Nose: No congestion/rhinnorhea. Mouth/Throat: Mucous membranes are moist.   Neck: No stridor. Trachea Midline. FROM Cardiovascular: Tachycardic, regular rhythm. Grossly normal heart sounds.  Good peripheral circulation. Respiratory: Normal respiratory effort.  No retractions. Lungs CTAB. Gastrointestinal: Tender in the right lower and right upper quadrant. No distention. No abdominal bruits.  Musculoskeletal: No lower extremity tenderness nor edema.  No joint effusions. Neurologic:  Normal speech and language. No gross focal neurologic deficits are appreciated.  Skin:  Skin is warm, dry and intact. No rash noted. Psychiatric: Mood and affect are normal. Speech and behavior are normal. GU: Deferred   ____________________________________________   LABS (all labs ordered are listed, but only abnormal results are displayed)  Labs Reviewed  COMPREHENSIVE METABOLIC PANEL - Abnormal; Notable for the following components:      Result Value   Sodium 134 (*)    Chloride 97 (*)    Glucose, Bld 349 (*)    All other components within normal limits  URINALYSIS, COMPLETE (UACMP) WITH MICROSCOPIC - Abnormal; Notable for the following components:   Color, Urine YELLOW (*)    APPearance HAZY (*)     Specific Gravity, Urine 1.031 (*)    Glucose, UA >=500 (*)    Ketones, ur 5 (*)    Protein, ur 100 (*)    All other components within normal limits  LIPASE, BLOOD  CBC  TROPONIN I (HIGH SENSITIVITY)  TROPONIN I (HIGH SENSITIVITY)   ____________________________________________   ED ECG REPORT I, Vanessa [place], the attending physician, personally viewed and interpreted this ECG.  EKG is sinus  tachycardia rate of 114, ST depression V4 through V6  and aVF, normal intervals ____________________________________________  RADIOLOGY   Official radiology report(s): Ct Angio Chest Pe W And/or Wo Contrast  Result Date: 07/26/2019 CLINICAL DATA:  Pleuritic chest pain and abdominal pain. EXAM: CT ANGIOGRAPHY CHEST WITH CONTRAST TECHNIQUE: Multidetector CT imaging of the chest was performed using the standard protocol during bolus administration of intravenous contrast. Multiplanar CT image reconstructions and MIPs were obtained to evaluate the vascular anatomy. CONTRAST:  59mL OMNIPAQUE IOHEXOL 350 MG/ML SOLN COMPARISON:  CT scan of the chest dated 09/27/2011 and CT scan of the abdomen dated 07/17/2018 FINDINGS: Cardiovascular: Satisfactory opacification of the pulmonary arteries to the segmental level. No evidence of pulmonary embolism. Normal heart size. No pericardial effusion. Aortic atherosclerosis. Coronary artery calcifications. Mediastinum/Nodes: No enlarged mediastinal, hilar, or axillary lymph nodes. Thyroid gland, trachea, and esophagus demonstrate no significant findings. Lungs/Pleura: There is a 17 x 8 mm irregular nodule at the left lung base laterally on image 70 of series 7. There are adjacent old nonunion rib fractures. There is a 2 mm nodule in the left lower lobe on image 51 of series 7, stable. There is a 2 mm stable nodule on image 27 of series 7 in the left upper lobe. Upper Abdomen: New hepatomegaly. Hepatic steatosis. Musculoskeletal: No chest wall abnormality. No acute or  significant osseous findings. Review of the MIP images confirms the above findings. IMPRESSION: 1. No pulmonary emboli or other acute abnormalities. 2. Irregular 17 x 8 mm nodule at the left lung base laterally. This could represent a primary lung carcinoma or metastatic disease. However, I suspect it may be posttraumatic in origin since the patient does have multiple adjacent but not acute rib fractures which are new since 2019. Comparison with any outside prior studies performed at the time of chest trauma would be helpful. 3. New hepatomegaly with hepatic steatosis. Aortic Atherosclerosis (ICD10-I70.0). Electronically Signed   By: Lorriane Shire M.D.   On: 07/26/2019 18:27   Ct Abdomen Pelvis W Contrast  Result Date: 07/26/2019 CLINICAL DATA:  Pleuritic chest pain.  Abnormal CT of the chest. EXAM: CT ABDOMEN AND PELVIS WITH CONTRAST TECHNIQUE: Multidetector CT imaging of the abdomen and pelvis was performed using the standard protocol following bolus administration of intravenous contrast. CONTRAST:  50mL OMNIPAQUE IOHEXOL 350 MG/ML SOLN COMPARISON:  CT abdomen pelvis 07/17/2018. FINDINGS: Lower chest: Left lower lobe pulmonary nodule described on CT chest of the same day. This is likely posttraumatic. Multiple left-sided rib fractures are remote. Heart size is normal. Hepatobiliary: Liver is enlarged. Hepatic steatosis is noted. No discrete lesions are present. Common bile duct and gallbladder are normal. Pancreas: Unremarkable. No pancreatic ductal dilatation or surrounding inflammatory changes. Spleen: Normal in size without focal abnormality. Adrenals/Urinary Tract: Adrenal glands are within normal limits. Kidneys and ureters are unremarkable. The urinary bladder is within normal limits. Stomach/Bowel: Stomach is within normal limits. Incomplete rotation the bowel is again noted. Duodenum and small bowel are otherwise within normal limits. Terminal ileum is unremarkable. Appendix is visualized and normal.  Ascending and transverse colon are within normal limits. Descending and sigmoid colon are normal. Vascular/Lymphatic: Extensive atherosclerotic calcifications are present in aorta and branch vessels. These coronary artery calcifications are present. Reproductive: Status post hysterectomy. No adnexal masses. Other: No abdominal wall hernia or abnormality. No abdominopelvic ascites. Musculoskeletal: Vertebral body heights alignment are maintained. Bony pelvis is intact. Degenerative changes are noted at the SI joints bilaterally. Hips are located and normal. IMPRESSION: 1.  Left lower lobe pulmonary nodule.  This is likely posttraumatic. 2. Increasing hepatomegaly. 3. Hepatic steatosis. 4.  Aortic Atherosclerosis (ICD10-I70.0). 5. Congenital malrotation of the bowel.  No acute abnormality. Electronically Signed   By: San Morelle M.D.   On: 07/26/2019 18:41    ____________________________________________   PROCEDURES  Procedure(s) performed (including Critical Care):  Procedures   ____________________________________________   INITIAL IMPRESSION / ASSESSMENT AND PLAN / ED COURSE  AMIKA TASSIN was evaluated in Emergency Department on 07/26/2019 for the symptoms described in the history of present illness. She was evaluated in the context of the global COVID-19 pandemic, which necessitated consideration that the patient might be at risk for infection with the SARS-CoV-2 virus that causes COVID-19. Institutional protocols and algorithms that pertain to the evaluation of patients at risk for COVID-19 are in a state of rapid change based on information released by regulatory bodies including the CDC and federal and state organizations. These policies and algorithms were followed during the patient's care in the ED.    Patient is a 73 year old female who presents with 2 days of abdominal pain but right upper and right lower.  Some pain with deep breath in her RUQ.   Patient does have some ST  depressions on her EKG.  Low suspicion for ACS however will get CT PE to rule out blood clot given patient's tachycardia and pleuritic chest pain.  Patient also has abdominal pain that could be consistent with appendicitis, cholecystitis.  Will get labs to evaluate for electrolyte abnormalities.  Denies symptoms to suggest UTI  Lipase is normal making pancreatitis less likely.  Glucose is elevated at 349 without evidence of DKA.  White count is normal.  Troponin was 11 get a repeat   Per patient she did have a stent placed a long time ago in her heart however she did have a cath 1 year ago but did not have any interventions.  CT scan negative for PE.  Does have a left lower lobe pulmonary nodule most likely from her prior trauma.  Patient also noted to have increasing hepatomegaly. Troponins are stable.  7:20 PM re-evaluated pt and doing well.  Recommended GI f/u for liver enlargement. Provided number.   I discussed the provisional nature of ED diagnosis, the treatment so far, the ongoing plan of care, follow up appointments and return precautions with the patient and any family or support people present. They expressed understanding and agreed with the plan, discharged home.   ____________________________________________   FINAL CLINICAL IMPRESSION(S) / ED DIAGNOSES   Final diagnoses:  Right upper quadrant abdominal pain      MEDICATIONS GIVEN DURING THIS VISIT:  Medications  iohexol (OMNIPAQUE) 350 MG/ML injection 75 mL (75 mLs Intravenous Contrast Given 07/26/19 1759)  sodium chloride 0.9 % bolus 1,000 mL (1,000 mLs Intravenous New Bag/Given 07/26/19 1841)     ED Discharge Orders    None       Note:  This document was prepared using Dragon voice recognition software and may include unintentional dictation errors.   Vanessa [place], MD 07/26/19 [place] Perfect

## 2019-07-26 NOTE — ED Notes (Signed)
Paper copy of signed dispo in chart

## 2019-07-26 NOTE — ED Triage Notes (Signed)
PT arrives with complaints of abdominal pain that started 2 days prior. Pt states "I was afraid it was appendicitis." Pt also reports diarrhea for the last 2 days but denies n/v.

## 2019-07-26 NOTE — Discharge Instructions (Addendum)
Your CT scan was reassuring.  However there was some evidence of liver enlargement.  You should call the GI doctors to make a follow-up appointment.  Return to the ER for worsening symptoms. Follow up with your primary doctor for the high blood pressure.     IMPRESSION: 1. Left lower lobe pulmonary nodule.  This is likely posttraumatic. 2. Increasing hepatomegaly. 3. Hepatic steatosis. 4.  Aortic Atherosclerosis (ICD10-I70.0). 5. Congenital malrotation of the bowel.  No acute abnormality. 1. No pulmonary emboli or other acute abnormalities.  2. Irregular 17 x 8 mm nodule at the left lung base laterally. This  could represent a primary lung carcinoma or metastatic disease.  However, I suspect it may be posttraumatic in origin since the  patient does have multiple adjacent but not acute rib fractures  which are new since 2019. Comparison with any outside prior studies  performed at the time of chest trauma would be helpful.  3. New hepatomegaly with hepatic steatosis.

## 2019-07-30 ENCOUNTER — Other Ambulatory Visit: Payer: Self-pay

## 2019-07-30 ENCOUNTER — Encounter: Payer: Self-pay | Admitting: Gastroenterology

## 2019-07-30 ENCOUNTER — Ambulatory Visit (INDEPENDENT_AMBULATORY_CARE_PROVIDER_SITE_OTHER): Payer: Medicare HMO | Admitting: Gastroenterology

## 2019-07-30 ENCOUNTER — Other Ambulatory Visit
Admission: RE | Admit: 2019-07-30 | Discharge: 2019-07-30 | Disposition: A | Discharge status: Home / Self Care | Payer: Medicare HMO | Attending: Gastroenterology | Admitting: Gastroenterology

## 2019-07-30 VITALS — BP 144/80 | HR 109 | Temp 98.6°F | Wt 196.0 lb

## 2019-07-30 DIAGNOSIS — R160 Hepatomegaly, not elsewhere classified: Secondary | ICD-10-CM | POA: Diagnosis not present

## 2019-07-30 LAB — PROTIME-INR
INR: 1 (ref 0.8–1.2)
Prothrombin Time: 12.8 seconds (ref 11.4–15.2)

## 2019-07-30 LAB — FERRITIN: Ferritin: 58 ng/mL (ref 11–307)

## 2019-07-30 NOTE — Progress Notes (Signed)
Joyce Potter [address]  [place], [place] [address]  Main: [phone]  Fax: [phone]   Gastroenterology Consultation  Referring Provider:     Maryland Pink, MD Primary Care Physician:  Maryland Pink, MD Reason for Consultation:     Hepatomegaly        HPI:    Chief complaint: Enlarged liver  Joyce Potter is a 73 y.o. y/o female referred for consultation & management  by Dr. Maryland Pink, MD.  Patient recently went to the ER with abdominal pain and on CT scan was found to metabolically and was referred to [place].  During the ER visit, it appears patient went in with right sided abdominal pain.  Now she points to her right mid back as the location of her pain.  Dull, 5/10, nonradiating.  No blood in stool.  Reports 2-3 formed bowel movements a day.  No weight loss.  No nausea or vomiting.  No dysphagia.  Denies any previous history of hepatitis.  No jaundice.  No nausea or vomiting.  Has had previous work-up for iron deficiency anemia with EGD, colonoscopy and small bowel capsule study.  Last EGD in July 17 2018 reported gastritis, otherwise normal.  EGD, July 07, 2018, small hiatal hernia and gastritis.  Otherwise normal.  Biopsies were negative for H. pylori.  Colonoscopy, July 07, 2018, with diminutive polyp in the descending colon and transverse colon removed.  Internal hemorrhoids reported.  One was tubular adenoma and one was hyperplastic polyp.  Patient underwent small bowel capsule study with Dr. Vira Agar in July 2019.  I do not have the results.  Patient states she was told it was normal but that it was cloudy and really could not see well.  Past Medical History:  Diagnosis Date   Anemia    iron deficiency   Aortic stenosis    Basal cell carcinoma    CAD (coronary artery disease)    s/p Left circumflex stent in 2012   Carotid stenosis    Cataract    High cholesterol    HTN (hypertension)    Hyperlipidemia    IDDM (insulin  dependent diabetes mellitus) ([place])    Multiple thyroid nodules    Benign   Peptic ulcer disease    Retinal artery occlusion    on left    Past Surgical History:  Procedure Laterality Date   ABDOMINAL HYSTERECTOMY     ARTERY BIOPSY Left 11/19/2015   Procedure: BIOPSY TEMPORAL ARTERY;  Surgeon: Algernon Huxley, MD;  Location: [facility]S;  Service: Vascular;  Laterality: Left;   BREAST BIOPSY Left 2002   core- neg   CARDIAC CATHETERIZATION N/A 08/08/2015   Procedure: Right and Left Heart Cath and Coronary Angiography;  Surgeon: Teodoro Spray, MD;  Location: [place] [facility];  Service: Cardiovascular;  Laterality: N/A;   CARDIAC CATHETERIZATION Bilateral 09/03/2016   Procedure: Right/Left Heart Cath and Coronary Angiography;  Surgeon: Teodoro Spray, MD;  Location: [place] [facility];  Service: Cardiovascular;  Laterality: Bilateral;   CATARACT EXTRACTION W/ INTRAOCULAR LENS  IMPLANT, BILATERAL     COLONOSCOPY     in 2013- internal hemorrhoids   COLONOSCOPY WITH PROPOFOL N/A 07/07/2018   Procedure: COLONOSCOPY WITH PROPOFOL;  Surgeon: Manya Silvas, MD;  Location: [facility];  Service: Endoscopy;  Laterality: N/A;   CORONARY ANGIOGRAPHY N/A 09/14/2017   Procedure: CORONARY ANGIOGRAPHY;  Surgeon: Teodoro Spray, MD;  Location: [place] [facility];  Service: Cardiovascular;  Laterality: N/A;   CORONARY ANGIOPLASTY     CORONARY STENT PLACEMENT     ENDARTERECTOMY Left 10/01/2015   Procedure: ENDARTERECTOMY CAROTID;  Surgeon: Algernon Huxley, MD;  Location: [facility];  Service: Vascular;  Laterality: Left;   ESOPHAGOGASTRODUODENOSCOPY     ESOPHAGOGASTRODUODENOSCOPY (EGD) WITH PROPOFOL N/A 05/31/2016   Procedure: ESOPHAGOGASTRODUODENOSCOPY (EGD) WITH PROPOFOL;  Surgeon: Manya Silvas, MD;  Location: [facility];  Service: Endoscopy;  Laterality: N/A;   ESOPHAGOGASTRODUODENOSCOPY (EGD) WITH PROPOFOL N/A 07/07/2018   Procedure: ESOPHAGOGASTRODUODENOSCOPY (EGD) WITH  PROPOFOL;  Surgeon: Manya Silvas, MD;  Location: [facility] END[facility]COPY;  Service: Endoscopy;  Laterality: N/A;   ESOPHAGOGASTRODUODENOSCOPY (EGD) WITH PROPOFOL N/A 07/17/2018   Procedure: ESOPHAGOGASTRODUODENOSCOPY (EGD) WITH PROPOFOL;  Surgeon: Lucilla Lame, MD;  Location: [facility];  Service: Endoscopy;  Laterality: N/A;   EYE SURGERY     GIVENS CAPSULE STUDY N/A 04/17/2013   Procedure: GIVENS CAPSULE STUDY;  Surgeon: Arta Silence, MD;  Location: [facility] ENDOSCOPY;  Service: Endoscopy;  Laterality: N/A;  patient ate breakfast at 7am    PARTIAL HYSTERECTOMY     RIGHT AND LEFT HEART CATH Bilateral 09/14/2017   Procedure: RIGHT AND LEFT HEART CATH;  Surgeon: Teodoro Spray, MD;  Location: [place] [facility];  Service: Cardiovascular;  Laterality: Bilateral;   TRIGGER FINGER RELEASE      Prior to Admission medications   Medication Sig Start Date End Date Taking? Authorizing Provider  acetaminophen (TYLENOL) 325 MG tablet Take 2 tablets (650 mg total) by mouth every 6 (six) hours as needed for mild pain (or Fever >/= 101). 10/12/18  Yes Vaughan Basta, MD  albuterol (PROAIR HFA) 108 (90 Base) MCG/ACT inhaler Inhale 2 puffs into the lungs every 4 (four) hours as needed for wheezing or shortness of breath.    Yes [provider]  alendronate (FOSAMAX) 70 MG tablet Take 70 mg by mouth every Tuesday.    Yes [provider]  bisacodyl (DULCOLAX) 5 MG EC tablet Take 1 tablet (5 mg total) by mouth daily as needed for moderate constipation. 10/12/18  Yes Vaughan Basta, MD  Emollient (CERAVE) CREA Apply 1 application topically daily as needed (for dry skin).    Yes [provider]  insulin NPH-regular Human (NOVOLIN 70/30) (70-30) 100 UNIT/ML injection Inject 60-70 Units into the skin See admin instructions. 80 units every morning and 60 units every afternoon   Yes [provider]  metoprolol tartrate (LOPRESSOR) 50 MG tablet Take 2 tablets (100 mg  total) by mouth 2 (two) times daily. 07/18/18  Yes Salary, Avel Peace, MD  pravastatin (PRAVACHOL) 10 MG tablet Take 1 tablet (10 mg total) by mouth daily at 6 PM. 09/29/15  Yes Mody, Sital, MD  traMADol (ULTRAM) 50 MG tablet Take 1 tablet (50 mg total) by mouth every 6 (six) hours as needed. 05/14/19  Yes Fisher, Linden Dolin, PA-C  diclofenac sodium (VOLTAREN) 1 % GEL Apply 4 g topically 4 (four) times daily. Patient not taking: Reported on 07/30/2019 05/14/19   Versie Starks, PA-C  losartan (COZAAR) 25 MG tablet Take 25 mg by mouth daily.    [provider]  pantoprazole (PROTONIX) 40 MG tablet Take 1 tablet (40 mg total) by mouth daily. Patient not taking: Reported on 07/30/2019 01/27/19 01/27/20  Harvest Dark, MD  phenazopyridine (PYRIDIUM) 200 MG tablet Take 1 tablet (200 mg total) by mouth 3 (three) times daily as needed for pain. Patient not taking: Reported on 07/30/2019 04/09/19   Lloyd Huger,  MD  tiotropium (SPIRIVA HANDIHALER) 18 MCG inhalation capsule Place 18 mcg into inhaler and inhale daily.  09/22/17 02/07/19  [provider]    Family History  Problem Relation Age of Onset   Uterine cancer Mother    Breast cancer Mother 73   Seizures Father    Stroke Father    Diabetes Father    COPD Father    Colon cancer Neg Hx      Social History   Tobacco Use   Smoking status: Former Smoker    Quit date: 02/15/1985    Years since quitting: 34.4   Smokeless tobacco: Never Used   Tobacco comment: quit about 31 years ago  Substance Use Topics   Alcohol use: No    Alcohol/week: 0.0 standard drinks   Drug use: No    Allergies as of 07/30/2019 - Review Complete 07/30/2019  Allergen Reaction Noted   Aspirin Other (See Comments) 09/28/2015   Invokamet [canagliflozin-metformin hcl] Other (See Comments) 08/08/2015   Sulfa antibiotics Other (See Comments) 04/13/2013   Lovastatin Rash 08/08/2015   Penicillins Rash 04/13/2013   Vicodin  [hydrocodone-acetaminophen] Rash 04/13/2013    Review of Systems:    All systems reviewed and negative except where noted in HPI.   Physical Exam:  BP (!) 144/80    Pulse (!) 109    Temp 98.6 F (37 C) (Oral)    Wt 196 lb (88.9 kg)    BMI 34.72 kg/m  No LMP recorded. Patient has had a hysterectomy. Psych:  Alert and cooperative. Normal mood and affect. General:   Alert,  Well-developed, well-nourished, pleasant and cooperative in NAD Head:  Normocephalic and atraumatic. Eyes:  Sclera clear, no icterus.   Conjunctiva pink. Ears:  Normal auditory acuity. Nose:  No deformity, discharge, or lesions. Mouth:  No deformity or lesions,oropharynx pink & moist. Neck:  Supple; no masses or thyromegaly. Abdomen:  Normal bowel sounds.  No bruits.  Soft, non-tender and non-distended without masses, hepatosplenomegaly or hernias noted.  No guarding or rebound tenderness.    Msk:  Symmetrical without gross deformities. Good, equal movement & strength bilaterally. Pulses:  Normal pulses noted. Extremities:  No clubbing or edema.  No cyanosis. Neurologic:  Alert and oriented x3;  grossly normal neurologically. Skin:  Intact without significant lesions or rashes. No jaundice. Lymph Nodes:  No significant cervical adenopathy. Psych:  Alert and cooperative. Normal mood and affect.   Labs: CBC    Component Value Date/Time   WBC 6.5 07/26/2019 1538   RBC 4.85 07/26/2019 1538   HGB 13.8 07/26/2019 1538   HGB 11.3 (L) 12/17/2014 1222   HCT 42.0 07/26/2019 1538   HCT 35.3 12/17/2014 1222   PLT 317 07/26/2019 1538   PLT 279 12/17/2014 1222   MCV 86.6 07/26/2019 1538   MCV 84 12/17/2014 1222   MCH 28.5 07/26/2019 1538   MCHC 32.9 07/26/2019 1538   RDW 12.3 07/26/2019 1538   RDW 14.1 12/17/2014 1222   LYMPHSABS 1.9 04/24/2019 1024   LYMPHSABS 1.6 12/17/2014 1222   MONOABS 0.6 04/24/2019 1024   MONOABS 0.5 12/17/2014 1222   EOSABS 0.4 04/24/2019 1024   EOSABS 0.3 12/17/2014 1222   BASOSABS 0.1  04/24/2019 1024   BASOSABS 0.1 12/17/2014 1222   CMP     Component Value Date/Time   NA 134 (L) 07/26/2019 1538   NA 143 05/04/2013 0614   K 4.2 07/26/2019 1538   K 3.8 05/04/2013 0614   CL 97 (L) 07/26/2019 1538  CL 110 (H) 05/04/2013 0614   CO2 27 07/26/2019 1538   CO2 27 05/04/2013 0614   GLUCOSE 349 (H) 07/26/2019 1538   GLUCOSE 130 (H) 05/04/2013 0614   BUN 19 07/26/2019 1538   BUN 12 05/04/2013 0614   CREATININE 0.83 07/26/2019 1538   CREATININE 0.79 08/27/2014 1445   CALCIUM 9.5 07/26/2019 1538   CALCIUM 8.2 (L) 05/04/2013 0614   PROT 7.9 07/26/2019 1538   PROT 7.6 08/27/2014 1445   ALBUMIN 4.2 07/26/2019 1538   ALBUMIN 3.5 08/27/2014 1445   AST 27 07/26/2019 1538   AST 27 08/27/2014 1445   ALT 29 07/26/2019 1538   ALT 27 08/27/2014 1445   ALKPHOS 89 07/26/2019 1538   ALKPHOS 101 08/27/2014 1445   BILITOT 0.8 07/26/2019 1538   BILITOT 0.4 08/27/2014 1445   GFRNONAA >60 07/26/2019 1538   GFRNONAA >60 08/27/2014 1445   GFRAA >60 07/26/2019 1538   GFRAA >60 08/27/2014 1445    Imaging Studies: Ct Angio Chest Pe W And/or Wo Contrast  Result Date: 07/26/2019 CLINICAL DATA:  Pleuritic chest pain and abdominal pain. EXAM: CT ANGIOGRAPHY CHEST WITH CONTRAST TECHNIQUE: Multidetector CT imaging of the chest was performed using the standard protocol during bolus administration of intravenous contrast. Multiplanar CT image reconstructions and MIPs were obtained to evaluate the vascular anatomy. CONTRAST:  82mL OMNIPAQUE IOHEXOL 350 MG/ML SOLN COMPARISON:  CT scan of the chest dated 09/27/2011 and CT scan of the abdomen dated 07/17/2018 FINDINGS: Cardiovascular: Satisfactory opacification of the pulmonary arteries to the segmental level. No evidence of pulmonary embolism. Normal heart size. No pericardial effusion. Aortic atherosclerosis. Coronary artery calcifications. Mediastinum/Nodes: No enlarged mediastinal, hilar, or axillary lymph nodes. Thyroid gland, trachea, and  esophagus demonstrate no significant findings. Lungs/Pleura: There is a 17 x 8 mm irregular nodule at the left lung base laterally on image 70 of series 7. There are adjacent old nonunion rib fractures. There is a 2 mm nodule in the left lower lobe on image 51 of series 7, stable. There is a 2 mm stable nodule on image 27 of series 7 in the left upper lobe. Upper Abdomen: New hepatomegaly. Hepatic steatosis. Musculoskeletal: No chest wall abnormality. No acute or significant osseous findings. Review of the MIP images confirms the above findings. IMPRESSION: 1. No pulmonary emboli or other acute abnormalities. 2. Irregular 17 x 8 mm nodule at the left lung base laterally. This could represent a primary lung carcinoma or metastatic disease. However, I suspect it may be posttraumatic in origin since the patient does have multiple adjacent but not acute rib fractures which are new since 2019. Comparison with any outside prior studies performed at the time of chest trauma would be helpful. 3. New hepatomegaly with hepatic steatosis. Aortic Atherosclerosis (ICD10-I70.0). Electronically Signed   By: Lorriane Shire M.D.   On: 07/26/2019 18:27   Ct Abdomen Pelvis W Contrast  Result Date: 07/26/2019 CLINICAL DATA:  Pleuritic chest pain.  Abnormal CT of the chest. EXAM: CT ABDOMEN AND PELVIS WITH CONTRAST TECHNIQUE: Multidetector CT imaging of the abdomen and pelvis was performed using the standard protocol following bolus administration of intravenous contrast. CONTRAST:  61mL OMNIPAQUE IOHEXOL 350 MG/ML SOLN COMPARISON:  CT abdomen pelvis 07/17/2018. FINDINGS: Lower chest: Left lower lobe pulmonary nodule described on CT chest of the same day. This is likely posttraumatic. Multiple left-sided rib fractures are remote. Heart size is normal. Hepatobiliary: Liver is enlarged. Hepatic steatosis is noted. No discrete lesions are present. Common bile duct and  gallbladder are normal. Pancreas: Unremarkable. No pancreatic ductal  dilatation or surrounding inflammatory changes. Spleen: Normal in size without focal abnormality. Adrenals/Urinary Tract: Adrenal glands are within normal limits. Kidneys and ureters are unremarkable. The urinary bladder is within normal limits. Stomach/Bowel: Stomach is within normal limits. Incomplete rotation the bowel is again noted. Duodenum and small bowel are otherwise within normal limits. Terminal ileum is unremarkable. Appendix is visualized and normal. Ascending and transverse colon are within normal limits. Descending and sigmoid colon are normal. Vascular/Lymphatic: Extensive atherosclerotic calcifications are present in aorta and branch vessels. These coronary artery calcifications are present. Reproductive: Status post hysterectomy. No adnexal masses. Other: No abdominal wall hernia or abnormality. No abdominopelvic ascites. Musculoskeletal: Vertebral body heights alignment are maintained. Bony pelvis is intact. Degenerative changes are noted at the SI joints bilaterally. Hips are located and normal. IMPRESSION: 1. Left lower lobe pulmonary nodule.  This is likely posttraumatic. 2. Increasing hepatomegaly. 3. Hepatic steatosis. 4.  Aortic Atherosclerosis (ICD10-I70.0). 5. Congenital malrotation of the bowel.  No acute abnormality. Electronically Signed   By: San Morelle M.D.   On: 07/26/2019 18:41    Assessment and Plan:   Joyce Potter is a 73 y.o. y/o female has been referred for hepatomegaly  Source for hepatomegaly is unknown Her liver enzymes in July 2020 are completely normal. No evidence of cholestasis  She does not have any pain in the right upper quadrant  We will initiate work-up for nontender hepatomegaly Will order autoimmune and viral hepatitis labs  We will also obtain Doppler ultrasound to rule out Budd-Chiari syndrome  No evidence of cirrhosis or ascites  Stable from iron deficiency anemia perspective with normal hemoglobin.  Work-up for this was  completed last year.  We will try to obtain small bowel capsule report from [facility] clinic  Over 50% of the time was spent in coordinating patient's care, explaining further management to the patient, and reviewing previous records  Dr Joyce Potter  Speech recognition software was used to dictate the above note.

## 2019-07-31 ENCOUNTER — Telehealth: Payer: Self-pay

## 2019-07-31 ENCOUNTER — Telehealth: Payer: Self-pay | Admitting: Gastroenterology

## 2019-07-31 DIAGNOSIS — I739 Peripheral vascular disease, unspecified: Secondary | ICD-10-CM | POA: Diagnosis not present

## 2019-07-31 DIAGNOSIS — R002 Palpitations: Secondary | ICD-10-CM | POA: Diagnosis not present

## 2019-07-31 DIAGNOSIS — M810 Age-related osteoporosis without current pathological fracture: Secondary | ICD-10-CM | POA: Diagnosis not present

## 2019-07-31 DIAGNOSIS — I10 Essential (primary) hypertension: Secondary | ICD-10-CM | POA: Diagnosis not present

## 2019-07-31 DIAGNOSIS — I6523 Occlusion and stenosis of bilateral carotid arteries: Secondary | ICD-10-CM | POA: Diagnosis not present

## 2019-07-31 DIAGNOSIS — R000 Tachycardia, unspecified: Secondary | ICD-10-CM | POA: Diagnosis not present

## 2019-07-31 DIAGNOSIS — E1165 Type 2 diabetes mellitus with hyperglycemia: Secondary | ICD-10-CM | POA: Diagnosis not present

## 2019-07-31 DIAGNOSIS — I350 Nonrheumatic aortic (valve) stenosis: Secondary | ICD-10-CM | POA: Diagnosis not present

## 2019-07-31 DIAGNOSIS — I2510 Atherosclerotic heart disease of native coronary artery without angina pectoris: Secondary | ICD-10-CM | POA: Diagnosis not present

## 2019-07-31 LAB — HEPATITIS B SURFACE ANTIGEN: Hepatitis B Surface Ag: NEGATIVE

## 2019-07-31 LAB — MITOCHONDRIAL/SMOOTH MUSCLE AB PNL
F-Actin IgG: 7 Units (ref 0–19)
Mitochondrial M2 Ab, IgG: <20 Units (ref 0.0–20.0)

## 2019-07-31 LAB — HEPATITIS C ANTIBODY (REFLEX): HCV Ab: <0.1 s/co ratio (ref 0.0–0.9)

## 2019-07-31 LAB — HCV COMMENT:

## 2019-07-31 LAB — HEPATITIS B CORE ANTIBODY, TOTAL: Hep B Core Total Ab: NEGATIVE

## 2019-07-31 LAB — HEPATITIS B CORE ANTIBODY, IGM: Hep B C IgM: NEGATIVE

## 2019-07-31 LAB — HEPATITIS A ANTIBODY, IGM: Hep A IgM: NEGATIVE

## 2019-07-31 LAB — HEPATITIS A ANTIBODY, TOTAL: hep A Total Ab: POSITIVE — AB

## 2019-07-31 LAB — HEPATITIS B SURFACE ANTIBODY,QUALITATIVE: Hep B S Ab: NONREACTIVE

## 2019-07-31 NOTE — Telephone Encounter (Signed)
Left message (okay per pt) that Liver Doppler scheduled at [name] on 08/07/19 (Tuesday). Arrival time 8:15 am and ultrasound at 8:30 am. Nothing to eat or drink after midnight. To contact office if any questions.

## 2019-07-31 NOTE — Telephone Encounter (Signed)
I called ultrasound to verify instructions and pt not to have anything to eat or drink after midnight and may take medications with a sip of water the morning of. Pt states her my chart instructions were to drink 32 oz water prior to ultrasound. Pt also when appropriate to do EGD/Colonoscopy at same time and aware that the small capsule study has to be done separately.

## 2019-07-31 NOTE — Telephone Encounter (Signed)
pATENT CALLED & IS HAVING AN U/S ON 08-07-19 & NEEDS CLARIFICATIONS ON HER INSTRUCTIONS. plEASE CALL HER & ADVISE.

## 2019-08-02 LAB — CERULOPLASMIN: Ceruloplasmin: 26.3 mg/dL (ref 19.0–39.0)

## 2019-08-03 DIAGNOSIS — E042 Nontoxic multinodular goiter: Secondary | ICD-10-CM | POA: Diagnosis not present

## 2019-08-03 DIAGNOSIS — E1159 Type 2 diabetes mellitus with other circulatory complications: Secondary | ICD-10-CM | POA: Diagnosis not present

## 2019-08-03 DIAGNOSIS — E1165 Type 2 diabetes mellitus with hyperglycemia: Secondary | ICD-10-CM | POA: Diagnosis not present

## 2019-08-03 DIAGNOSIS — R809 Proteinuria, unspecified: Secondary | ICD-10-CM | POA: Diagnosis not present

## 2019-08-03 DIAGNOSIS — I10 Essential (primary) hypertension: Secondary | ICD-10-CM | POA: Diagnosis not present

## 2019-08-03 DIAGNOSIS — Z794 Long term (current) use of insulin: Secondary | ICD-10-CM | POA: Diagnosis not present

## 2019-08-03 DIAGNOSIS — E1129 Type 2 diabetes mellitus with other diabetic kidney complication: Secondary | ICD-10-CM | POA: Diagnosis not present

## 2019-08-03 DIAGNOSIS — E1142 Type 2 diabetes mellitus with diabetic polyneuropathy: Secondary | ICD-10-CM | POA: Diagnosis not present

## 2019-08-03 DIAGNOSIS — M810 Age-related osteoporosis without current pathological fracture: Secondary | ICD-10-CM | POA: Diagnosis not present

## 2019-08-07 ENCOUNTER — Other Ambulatory Visit: Payer: Self-pay

## 2019-08-07 ENCOUNTER — Ambulatory Visit
Admission: RE | Admit: 2019-08-07 | Discharge: 2019-08-07 | Disposition: A | Discharge status: Home / Self Care | Payer: Medicare HMO | Source: Ambulatory Visit | Attending: Gastroenterology | Admitting: Gastroenterology

## 2019-08-07 DIAGNOSIS — R160 Hepatomegaly, not elsewhere classified: Secondary | ICD-10-CM | POA: Insufficient documentation

## 2019-08-10 NOTE — Progress Notes (Signed)
[place]  [phone] [phone]) [phone]  ID: PEMA THOMURE OB: [birth year]  MR#: [id number]  IYM#:[id number]  Patient Care Team: [place] Pink, MD as PCP - General (Family Medicine) Manya Silvas, MD (Gastroenterology) Lloyd Huger, MD as Consulting Physician (Hematology and Oncology)  CHIEF COMPLAINT: Iron deficiency anemia, secondary to chronic blood loss.  INTERVAL HISTORY: Patient returns to clinic today for repeat laboratory work and further evaluation.  She has chronic right knee pain, but otherwise feels well.  She does not complain of weakness and fatigue today.  She denies any recent fevers or illnesses.  She has no neurological complaints. She denies chest pain, shortness of breath, cough or hemoptysis. She denies any nausea, vomiting, constipation, or diarrhea.  She has no melena or hematochezia.  She has no urinary complaints.  Patient offers no further specific complaints today.  REVIEW OF SYSTEMS:   Review of Systems  Constitutional: Negative.  Negative for diaphoresis, fever, malaise/fatigue and weight loss.  Eyes: Negative.  Negative for blurred vision.  Respiratory: Negative.  Negative for cough, hemoptysis and shortness of breath.   Cardiovascular: Negative.  Negative for chest pain and leg swelling.  Gastrointestinal: Negative for abdominal pain, blood in stool and melena.  Genitourinary: Negative.  Negative for flank pain and hematuria.  Musculoskeletal: Positive for joint pain. Negative for falls.  Skin: Negative.  Negative for rash.  Neurological: Negative.  Negative for dizziness, focal weakness, weakness and headaches.  Psychiatric/Behavioral: Negative.  The patient is not nervous/anxious.    As per HPI. Otherwise, a complete review of systems is negative.  PAST MEDICAL HISTORY: Past Medical History:  Diagnosis Date   Anemia    iron deficiency   Aortic stenosis    Basal cell carcinoma    CAD (coronary  artery disease)    s/p Left circumflex stent in 2012   Carotid stenosis    Cataract    High cholesterol    HTN (hypertension)    Hyperlipidemia    IDDM (insulin dependent diabetes mellitus) ([place])    Multiple thyroid nodules    Benign   Peptic ulcer disease    Retinal artery occlusion    on left    PAST SURGICAL HISTORY: Past Surgical History:  Procedure Laterality Date   ABDOMINAL HYSTERECTOMY     ARTERY BIOPSY Left 11/19/2015   Procedure: BIOPSY TEMPORAL ARTERY;  Surgeon: Algernon Huxley, MD;  Location: [facility] ORS;  Service: Vascular;  Laterality: Left;   BREAST BIOPSY Left 2002   core- neg   CARDIAC CATHETERIZATION N/A 08/08/2015   Procedure: Right and Left Heart Cath and Coronary Angiography;  Surgeon: Teodoro Spray, MD;  Location: [place] [facility];  Service: Cardiovascular;  Laterality: N/A;   CARDIAC CATHETERIZATION Bilateral 09/03/2016   Procedure: Right/Left Heart Cath and Coronary Angiography;  Surgeon: Teodoro Spray, MD;  Location: [place] [facility];  Service: Cardiovascular;  Laterality: Bilateral;   CATARACT EXTRACTION W/ INTRAOCULAR LENS  IMPLANT, BILATERAL     COLONOSCOPY     in 2013- internal hemorrhoids   COLONOSCOPY WITH PROPOFOL N/A 07/07/2018   Procedure: COLONOSCOPY WITH PROPOFOL;  Surgeon: Manya Silvas, MD;  Location: [facility] ENDOSCOPY;  Service: Endoscopy;  Laterality: N/A;   CORONARY ANGIOGRAPHY N/A 09/14/2017   Procedure: CORONARY ANGIOGRAPHY;  Surgeon: Teodoro Spray, MD;  Location: [place] [facility];  Service: Cardiovascular;  Laterality: N/A;   CORONARY ANGIOPLASTY     CORONARY STENT PLACEMENT     ENDARTERECTOMY Left 10/01/2015  Procedure: ENDARTERECTOMY CAROTID;  Surgeon: Algernon Huxley, MD;  Location: [facility] ORS;  Service: Vascular;  Laterality: Left;   ESOPHAGOGASTRODUODENOSCOPY     ESOPHAGOGASTRODUODENOSCOPY (EGD) WITH PROPOFOL N/A 05/31/2016   Procedure: ESOPHAGOGASTRODUODENOSCOPY (EGD) WITH PROPOFOL;  Surgeon: Manya Silvas, MD;  Location: [facility] ENDOSCOPY;  Service: Endoscopy;  Laterality: N/A;   ESOPHAGOGASTRODUODENOSCOPY (EGD) WITH PROPOFOL N/A 07/07/2018   Procedure: ESOPHAGOGASTRODUODENOSCOPY (EGD) WITH PROPOFOL;  Surgeon: Manya Silvas, MD;  Location: [facility] ENDOSCOPY;  Service: Endoscopy;  Laterality: N/A;   ESOPHAGOGASTRODUODENOSCOPY (EGD) WITH PROPOFOL N/A 07/17/2018   Procedure: ESOPHAGOGASTRODUODENOSCOPY (EGD) WITH PROPOFOL;  Surgeon: Lucilla Lame, MD;  Location: [facility] ENDOSCOPY;  Service: Endoscopy;  Laterality: N/A;   EYE SURGERY     GIVENS CAPSULE STUDY N/A 04/17/2013   Procedure: GIVENS CAPSULE STUDY;  Surgeon: Arta Silence, MD;  Location: [facility] ENDOSCOPY;  Service: Endoscopy;  Laterality: N/A;  patient ate breakfast at [place]  07/27/2016   Dr Viona Gilmore. Tamala Julian   PARTIAL HYSTERECTOMY     RIGHT AND LEFT HEART CATH Bilateral 09/14/2017   Procedure: RIGHT AND LEFT HEART CATH;  Surgeon: Teodoro Spray, MD;  Location: [place] [facility];  Service: Cardiovascular;  Laterality: Bilateral;   TRIGGER FINGER RELEASE      FAMILY HISTORY Family History  Problem Relation Age of Onset   Uterine cancer Mother    Breast cancer Mother 70   Seizures Father    Stroke Father    Diabetes Father    COPD Father    Colon cancer Neg Hx       ADVANCED DIRECTIVES:    HEALTH MAINTENANCE: Social History   Tobacco Use   Smoking status: Former Smoker    Quit date: 02/15/1985    Years since quitting: 34.5   Smokeless tobacco: Never Used   Tobacco comment: quit about 31 years ago  Substance Use Topics   Alcohol use: No    Alcohol/week: 0.0 standard drinks   Drug use: No    Colonoscopy:  PAP:  Bone density: Osteoporosis- DEXA 06/2017 (on fosamax)  Lipid panel:  Allergies  Allergen Reactions   Aspirin Other (See Comments)    ulcers   Invokamet [Canagliflozin-Metformin Hcl] Other (See Comments)    Yeast infection   Sulfa Antibiotics Other (See Comments)    "Got  Drunk"   Lovastatin Rash   Penicillins Rash    Has patient had a PCN reaction causing immediate rash, facial/tongue/throat swelling, SOB or lightheadedness with hypotension:Yes Has patient had a PCN reaction causing severe rash involving mucus membranes or skin necrosis:all over body Has patient had a PCN reaction that required hospitalization: No Has patient had a PCN reaction occurring within the last 10 years: No If all of the above answers are "NO", then may proceed with Cephalosporin use.    Vicodin [Hydrocodone-Acetaminophen] Rash    Current Outpatient Medications  Medication Sig Dispense Refill   acetaminophen (TYLENOL) 325 MG tablet Take 2 tablets (650 mg total) by mouth every 6 (six) hours as needed for mild pain (or Fever >/= 101). 30 tablet 0   albuterol (PROAIR HFA) 108 (90 Base) MCG/ACT inhaler Inhale 2 puffs into the lungs every 4 (four) hours as needed for wheezing or shortness of breath.      alendronate (FOSAMAX) 70 MG tablet Take 70 mg by mouth every Tuesday.      alendronate (FOSAMAX) 70 MG tablet Take by mouth.     amitriptyline (ELAVIL) 25 MG tablet Take by mouth.  bisacodyl (DULCOLAX) 5 MG EC tablet Take 1 tablet (5 mg total) by mouth daily as needed for moderate constipation. 30 tablet 0   diclofenac sodium (VOLTAREN) 1 % GEL Apply 4 g topically 4 (four) times daily. 100 g 0   diclofenac sodium (VOLTAREN) 1 % GEL Apply topically.     Emollient (CERAVE) CREA Apply 1 application topically daily as needed (for dry skin).      gabapentin (NEURONTIN) 300 MG capsule Take by mouth.     insulin NPH-regular Human (70-30) 100 UNIT/ML injection Inject into the skin.     insulin NPH-regular Human (NOVOLIN 70/30) (70-30) 100 UNIT/ML injection Inject 60-70 Units into the skin See admin instructions. 80 units every morning and 60 units every afternoon     insulin NPH-regular Human (NOVOLIN 70/30) (70-30) 100 UNIT/ML injection Inject 80 units every morning and 60  units every night     Insulin Syringe-Needle U-100 (INSULIN SYRINGE 1CC/30GX5/16") 30G X 5/16" 1 ML MISC USE ONE TWICE DAILY WITH INSULIN     isosorbide mononitrate (IMDUR) 30 MG 24 hr tablet Take by mouth.     losartan (COZAAR) 25 MG tablet Take 25 mg by mouth daily.     metoprolol tartrate (LOPRESSOR) 100 MG tablet Take by mouth.     metoprolol tartrate (LOPRESSOR) 50 MG tablet Take 2 tablets (100 mg total) by mouth 2 (two) times daily. (Patient taking differently: Take 150 mg by mouth 2 (two) times daily. ) 60 tablet 0   omeprazole (PRILOSEC) 40 MG capsule Take 40 mg by mouth 2 (two) times daily.     omeprazole (PRILOSEC) 40 MG capsule Take by mouth.     pantoprazole (PROTONIX) 40 MG tablet Take 1 tablet (40 mg total) by mouth daily. 30 tablet 1   phenazopyridine (PYRIDIUM) 200 MG tablet Take by mouth.     pravastatin (PRAVACHOL) 10 MG tablet Take 1 tablet (10 mg total) by mouth daily at 6 PM. 30 tablet 0   Semaglutide,0.25 or 0.5MG/DOS, 2 MG/1.5ML SOPN Take 0.25 mg for 4 weeks then increase to 0.5 mg weekly.     tiotropium (SPIRIVA HANDIHALER) 18 MCG inhalation capsule Place 18 mcg into inhaler and inhale daily.      tiotropium (SPIRIVA) 18 MCG inhalation capsule Place into inhaler and inhale.     traMADol (ULTRAM) 50 MG tablet Take by mouth.     No current facility-administered medications for this visit.    Facility-Administered Medications Ordered in Other Visits  Medication Dose Route Frequency Provider Last Rate Last Dose   heparin lock flush 100 unit/mL  500 Units Intravenous Once Verlon Au, NP       sodium chloride flush (NS) 0.9 % injection 10 mL  10 mL Intravenous Once Verlon Au, NP        OBJECTIVE: Vitals:   08/16/19 1340  BP: (!) 167/73  Pulse: 60  Temp: 98.3 F (36.8 C)     Body mass index is 35.96 kg/m.    ECOG FS:0 - Asymptomatic  General: Well-developed, well-nourished, no acute distress. Eyes: Pink conjunctiva, anicteric  sclera. HEENT: Normocephalic, moist mucous membranes. Lungs: Clear to auscultation bilaterally. Heart: Regular rate and rhythm. No rubs, murmurs, or gallops. Abdomen: Soft, nontender, nondistended. No organomegaly noted, normoactive bowel sounds. Musculoskeletal: No edema, cyanosis, or clubbing. Neuro: Alert, answering all questions appropriately. Cranial nerves grossly intact. Skin: No rashes or petechiae noted. Psych: Normal affect.  LAB RESULTS:  Lab Results  Component Value Date   NA 134 (  L) 07/26/2019   K 4.2 07/26/2019   CL 97 (L) 07/26/2019   CO2 27 07/26/2019   GLUCOSE 349 (H) 07/26/2019   BUN 19 07/26/2019   CREATININE 0.83 07/26/2019   CALCIUM 9.5 07/26/2019   PROT 7.9 07/26/2019   ALBUMIN 4.2 07/26/2019   AST 27 07/26/2019   ALT 29 07/26/2019   ALKPHOS 89 07/26/2019   BILITOT 0.8 07/26/2019   GFRNONAA >60 07/26/2019   GFRAA >60 07/26/2019    Lab Results  Component Value Date   WBC 4.9 08/16/2019   NEUTROABS 2.6 08/16/2019   HGB 11.9 (L) 08/16/2019   HCT 36.9 08/16/2019   MCV 87.0 08/16/2019   PLT 244 08/16/2019   Lab Results  Component Value Date   IRON 68 04/24/2019   TIBC 305 04/24/2019   IRONPCTSAT 22 04/24/2019    Lab Results  Component Value Date   FERRITIN 58 07/30/2019     STUDIES: Ct Angio Chest Pe W And/or Wo Contrast  Result Date: 07/26/2019 CLINICAL DATA:  Pleuritic chest pain and abdominal pain. EXAM: CT ANGIOGRAPHY CHEST WITH CONTRAST TECHNIQUE: Multidetector CT imaging of the chest was performed using the standard protocol during bolus administration of intravenous contrast. Multiplanar CT image reconstructions and MIPs were obtained to evaluate the vascular anatomy. CONTRAST:  9m OMNIPAQUE IOHEXOL 350 MG/ML SOLN COMPARISON:  CT scan of the chest dated 09/27/2011 and CT scan of the abdomen dated 07/17/2018 FINDINGS: Cardiovascular: Satisfactory opacification of the pulmonary arteries to the segmental level. No evidence of pulmonary  embolism. Normal heart size. No pericardial effusion. Aortic atherosclerosis. Coronary artery calcifications. Mediastinum/Nodes: No enlarged mediastinal, hilar, or axillary lymph nodes. Thyroid gland, trachea, and esophagus demonstrate no significant findings. Lungs/Pleura: There is a 17 x 8 mm irregular nodule at the left lung base laterally on image 70 of series 7. There are adjacent old nonunion rib fractures. There is a 2 mm nodule in the left lower lobe on image 51 of series 7, stable. There is a 2 mm stable nodule on image 27 of series 7 in the left upper lobe. Upper Abdomen: New hepatomegaly. Hepatic steatosis. Musculoskeletal: No chest wall abnormality. No acute or significant osseous findings. Review of the MIP images confirms the above findings. IMPRESSION: 1. No pulmonary emboli or other acute abnormalities. 2. Irregular 17 x 8 mm nodule at the left lung base laterally. This could represent a primary lung carcinoma or metastatic disease. However, I suspect it may be posttraumatic in origin since the patient does have multiple adjacent but not acute rib fractures which are new since 2019. Comparison with any outside prior studies performed at the time of chest trauma would be helpful. 3. New hepatomegaly with hepatic steatosis. Aortic Atherosclerosis (ICD10-I70.0). Electronically Signed   By: JLorriane ShireM.D.   On: 07/26/2019 18:27   Ct Abdomen Pelvis W Contrast  Result Date: 07/26/2019 CLINICAL DATA:  Pleuritic chest pain.  Abnormal CT of the chest. EXAM: CT ABDOMEN AND PELVIS WITH CONTRAST TECHNIQUE: Multidetector CT imaging of the abdomen and pelvis was performed using the standard protocol following bolus administration of intravenous contrast. CONTRAST:  774mOMNIPAQUE IOHEXOL 350 MG/ML SOLN COMPARISON:  CT abdomen pelvis 07/17/2018. FINDINGS: Lower chest: Left lower lobe pulmonary nodule described on CT chest of the same day. This is likely posttraumatic. Multiple left-sided rib fractures are  remote. Heart size is normal. Hepatobiliary: Liver is enlarged. Hepatic steatosis is noted. No discrete lesions are present. Common bile duct and gallbladder are normal. Pancreas: Unremarkable. No pancreatic  ductal dilatation or surrounding inflammatory changes. Spleen: Normal in size without focal abnormality. Adrenals/Urinary Tract: Adrenal glands are within normal limits. Kidneys and ureters are unremarkable. The urinary bladder is within normal limits. Stomach/Bowel: Stomach is within normal limits. Incomplete rotation the bowel is again noted. Duodenum and small bowel are otherwise within normal limits. Terminal ileum is unremarkable. Appendix is visualized and normal. Ascending and transverse colon are within normal limits. Descending and sigmoid colon are normal. Vascular/Lymphatic: Extensive atherosclerotic calcifications are present in aorta and branch vessels. These coronary artery calcifications are present. Reproductive: Status post hysterectomy. No adnexal masses. Other: No abdominal wall hernia or abnormality. No abdominopelvic ascites. Musculoskeletal: Vertebral body heights alignment are maintained. Bony pelvis is intact. Degenerative changes are noted at the SI joints bilaterally. Hips are located and normal. IMPRESSION: 1. Left lower lobe pulmonary nodule.  This is likely posttraumatic. 2. Increasing hepatomegaly. 3. Hepatic steatosis. 4.  Aortic Atherosclerosis (ICD10-I70.0). 5. Congenital malrotation of the bowel.  No acute abnormality. Electronically Signed   By: San Morelle M.D.   On: 07/26/2019 18:41   US Liver Doppler  Result Date: 08/07/2019 CLINICAL DATA:  Hepatomegaly, steatosis noted on recent CT EXAM: DUPLEX ULTRASOUND OF LIVER TECHNIQUE: Color and duplex Doppler ultrasound was performed to evaluate the hepatic in-flow and out-flow vessels. COMPARISON:  CT 07/26/2019 FINDINGS: Portal Vein 1.3 cm diameter. No evidence of occlusion or thrombus. Velocities (hepatopetal): Main:   31-38 cm/sec Right:  28 cm/sec Left:  38 cm/sec Hepatic Vein Velocities (hepatofugal): Right:  33 cm/sec Middle:  31 cm/sec Left:  14 cm/sec Intrahepatic IVC patent, velocity: 33 cm/sec Hepatic Artery Velocity:  84/19 cm/sec Spleen 11 x 4.2 x 10.6 cm (volume = 260 cm^3). Splenic Vein: No evidence of occlusion or thrombus. Velocity: 22 cm/sec Varices: None seen Ascites: None IMPRESSION: 1. Unremarkable hepatic vascular Doppler evaluation. Electronically Signed   By: Lucrezia Europe M.D.   On: 08/07/2019 09:18    ASSESSMENT: Iron deficiency anemia, secondary to chronic blood loss.  PLAN:    1. Iron deficiency anemia: Nearly resolved.  Patient's hemoglobin is 11.9 and her ferritin is within normal limits.  The remainder of her iron panel is pending at time of dictation.  Nuclear medicine bleeding scan completed on 08/23/2018 did not reveal any significant GI bleed.  Bone marrow biopsy on 02/15/2017 did not reveal any significant pathology.  Patient reports she has a colonoscopy, EGD, and video endoscopy scheduled for next week.  No intervention is needed at this time.  Patient last received IV Feraheme on November 08, 2018.  Return to clinic in 4 months with repeat laboratory work and further evaluation. 2.  Knee pain: Continue follow-up and treatment per orthopedics.   I spent a total of 20 minutes face-to-face with the patient of which greater than 50% of the visit was spent in counseling and coordination of care as detailed above.   Patient expressed understanding and was in agreement with this plan. She also understands that She can call clinic at any time with any questions, concerns, or complaints.    Lloyd Huger, MD 08/16/19 3:22 PM

## 2019-08-13 ENCOUNTER — Telehealth: Payer: Self-pay | Admitting: Gastroenterology

## 2019-08-13 NOTE — Telephone Encounter (Signed)
Pt left vm she states Dr. Bonna Gains ordered test for her and her Hep A was positive she has questions regarding this test and results please call pt

## 2019-08-15 ENCOUNTER — Other Ambulatory Visit: Payer: Self-pay

## 2019-08-15 DIAGNOSIS — D500 Iron deficiency anemia secondary to blood loss (chronic): Secondary | ICD-10-CM

## 2019-08-16 ENCOUNTER — Encounter: Payer: Self-pay | Admitting: *Deleted

## 2019-08-16 ENCOUNTER — Inpatient Hospital Stay (HOSPITAL_BASED_OUTPATIENT_CLINIC_OR_DEPARTMENT_OTHER): Payer: Medicare HMO | Admitting: Oncology

## 2019-08-16 ENCOUNTER — Encounter: Payer: Self-pay | Admitting: Anesthesiology

## 2019-08-16 ENCOUNTER — Other Ambulatory Visit: Payer: Self-pay

## 2019-08-16 ENCOUNTER — Inpatient Hospital Stay: Payer: Medicare HMO | Attending: Oncology

## 2019-08-16 ENCOUNTER — Encounter: Payer: Self-pay | Admitting: Oncology

## 2019-08-16 ENCOUNTER — Inpatient Hospital Stay: Payer: Medicare HMO

## 2019-08-16 VITALS — BP 167/73 | HR 60 | Temp 98.3°F | Wt 203.0 lb

## 2019-08-16 DIAGNOSIS — D500 Iron deficiency anemia secondary to blood loss (chronic): Secondary | ICD-10-CM

## 2019-08-16 DIAGNOSIS — M810 Age-related osteoporosis without current pathological fracture: Secondary | ICD-10-CM | POA: Insufficient documentation

## 2019-08-16 DIAGNOSIS — M25561 Pain in right knee: Secondary | ICD-10-CM | POA: Insufficient documentation

## 2019-08-16 DIAGNOSIS — D509 Iron deficiency anemia, unspecified: Secondary | ICD-10-CM

## 2019-08-16 DIAGNOSIS — G8929 Other chronic pain: Secondary | ICD-10-CM | POA: Diagnosis not present

## 2019-08-16 DIAGNOSIS — Z87891 Personal history of nicotine dependence: Secondary | ICD-10-CM | POA: Insufficient documentation

## 2019-08-16 LAB — CBC WITH DIFFERENTIAL/PLATELET
Abs Immature Granulocytes: 0.01 10*3/uL (ref 0.00–0.07)
Basophils Absolute: 0.1 10*3/uL (ref 0.0–0.1)
Basophils Relative: 1 %
Eosinophils Absolute: 0.2 10*3/uL (ref 0.0–0.5)
Eosinophils Relative: 4 %
HCT: 36.9 % (ref 36.0–46.0)
Hemoglobin: 11.9 g/dL — ABNORMAL LOW (ref 12.0–15.0)
Immature Granulocytes: 0 %
Lymphocytes Relative: 30 %
Lymphs Abs: 1.5 10*3/uL (ref 0.7–4.0)
MCH: 28.1 pg (ref 26.0–34.0)
MCHC: 32.2 g/dL (ref 30.0–36.0)
MCV: 87 fL (ref 80.0–100.0)
Monocytes Absolute: 0.6 10*3/uL (ref 0.1–1.0)
Monocytes Relative: 13 %
Neutro Abs: 2.6 10*3/uL (ref 1.7–7.7)
Neutrophils Relative %: 52 %
Platelets: 244 10*3/uL (ref 150–400)
RBC: 4.24 MIL/uL (ref 3.87–5.11)
RDW: 12.2 % (ref 11.5–15.5)
WBC: 4.9 10*3/uL (ref 4.0–10.5)
nRBC: 0 % (ref 0.0–0.2)

## 2019-08-16 LAB — IRON AND TIBC
Iron: 50 ug/dL (ref 28–170)
Saturation Ratios: 18 % (ref 10.4–31.8)
TIBC: 286 ug/dL (ref 250–450)
UIBC: 236 ug/dL

## 2019-08-16 LAB — SAMPLE TO BLOOD BANK

## 2019-08-16 LAB — FERRITIN: Ferritin: 39 ng/mL (ref 11–307)

## 2019-08-16 NOTE — Progress Notes (Signed)
Pt presents for follow up visit today. Pt reports  increased chronic knee pain and states she had an ultrasound of her liver last week.

## 2019-08-17 NOTE — Discharge Instructions (Signed)

## 2019-08-21 ENCOUNTER — Ambulatory Visit: Admission: RE | Admit: 2019-08-21 | Payer: Medicare HMO | Source: Home / Self Care | Admitting: Gastroenterology

## 2019-08-21 SURGERY — COLONOSCOPY WITH PROPOFOL
Anesthesia: Choice

## 2019-08-22 DIAGNOSIS — M1711 Unilateral primary osteoarthritis, right knee: Secondary | ICD-10-CM | POA: Diagnosis not present

## 2019-08-29 ENCOUNTER — Telehealth: Payer: Self-pay

## 2019-08-29 NOTE — Telephone Encounter (Signed)
-----   Message from Virgel Manifold, MD sent at 08/29/2019 12:48 PM EDT ----- Can you call [facility] gastroenterology.  They have send [place] records that we had requested, but the main record and I am looking for is a small bowel capsule study that was done in 2019 and read by Dr. Vira Agar.  I need that report and that is all that is missing.  If they can fax that over that would be great.

## 2019-08-30 DIAGNOSIS — M25469 Effusion, unspecified knee: Secondary | ICD-10-CM | POA: Diagnosis not present

## 2019-08-30 DIAGNOSIS — M170 Bilateral primary osteoarthritis of knee: Secondary | ICD-10-CM | POA: Diagnosis not present

## 2019-08-30 DIAGNOSIS — M25561 Pain in right knee: Secondary | ICD-10-CM | POA: Diagnosis not present

## 2019-08-30 DIAGNOSIS — M25562 Pain in left knee: Secondary | ICD-10-CM | POA: Diagnosis not present

## 2019-08-30 NOTE — Telephone Encounter (Signed)
Called the [facility] clinic gastroenterology office. They state they will fax [place] the small bowel capsule study.

## 2019-09-04 DIAGNOSIS — R69 Illness, unspecified: Secondary | ICD-10-CM | POA: Diagnosis not present

## 2019-09-05 DIAGNOSIS — R69 Illness, unspecified: Secondary | ICD-10-CM | POA: Diagnosis not present

## 2019-09-05 DIAGNOSIS — E1159 Type 2 diabetes mellitus with other circulatory complications: Secondary | ICD-10-CM | POA: Diagnosis not present

## 2019-09-05 DIAGNOSIS — E669 Obesity, unspecified: Secondary | ICD-10-CM | POA: Diagnosis not present

## 2019-09-05 DIAGNOSIS — I10 Essential (primary) hypertension: Secondary | ICD-10-CM | POA: Diagnosis not present

## 2019-09-05 DIAGNOSIS — Z794 Long term (current) use of insulin: Secondary | ICD-10-CM | POA: Diagnosis not present

## 2019-09-05 DIAGNOSIS — E1129 Type 2 diabetes mellitus with other diabetic kidney complication: Secondary | ICD-10-CM | POA: Diagnosis not present

## 2019-09-05 DIAGNOSIS — I350 Nonrheumatic aortic (valve) stenosis: Secondary | ICD-10-CM | POA: Diagnosis not present

## 2019-09-05 DIAGNOSIS — E042 Nontoxic multinodular goiter: Secondary | ICD-10-CM | POA: Diagnosis not present

## 2019-09-05 DIAGNOSIS — I2510 Atherosclerotic heart disease of native coronary artery without angina pectoris: Secondary | ICD-10-CM | POA: Diagnosis not present

## 2019-09-05 DIAGNOSIS — M810 Age-related osteoporosis without current pathological fracture: Secondary | ICD-10-CM | POA: Diagnosis not present

## 2019-09-05 DIAGNOSIS — R809 Proteinuria, unspecified: Secondary | ICD-10-CM | POA: Diagnosis not present

## 2019-09-05 DIAGNOSIS — E1169 Type 2 diabetes mellitus with other specified complication: Secondary | ICD-10-CM | POA: Diagnosis not present

## 2019-09-05 DIAGNOSIS — E1165 Type 2 diabetes mellitus with hyperglycemia: Secondary | ICD-10-CM | POA: Diagnosis not present

## 2019-09-05 DIAGNOSIS — I6523 Occlusion and stenosis of bilateral carotid arteries: Secondary | ICD-10-CM | POA: Diagnosis not present

## 2019-09-06 ENCOUNTER — Other Ambulatory Visit
Admission: RE | Admit: 2019-09-06 | Discharge: 2019-09-06 | Disposition: A | Discharge status: Home / Self Care | Payer: Medicare HMO | Attending: Gastroenterology | Admitting: Gastroenterology

## 2019-09-06 ENCOUNTER — Other Ambulatory Visit: Payer: Self-pay

## 2019-09-06 ENCOUNTER — Telehealth: Payer: Self-pay | Admitting: Gastroenterology

## 2019-09-06 DIAGNOSIS — R1030 Lower abdominal pain, unspecified: Secondary | ICD-10-CM

## 2019-09-06 LAB — C-REACTIVE PROTEIN: CRP: 1.2 mg/dL — ABNORMAL HIGH (ref <1.0)

## 2019-09-06 LAB — SEDIMENTATION RATE: Sed Rate: 24 mm/hr (ref 0–30)

## 2019-09-06 LAB — LACTIC ACID, PLASMA: Lactic Acid, Venous: 1.8 mmol/L (ref 0.5–1.9)

## 2019-09-06 NOTE — Telephone Encounter (Signed)
Per Dr. Bonna Gains Order ESR, CRP and Lactic acid in the meantime and have her get these drawn. For abdominal pain   Order labs and called patient to get lab work done. Patient states she will come to get lab work at our office today between 1pm and 4pm.

## 2019-09-06 NOTE — Telephone Encounter (Signed)
Patient called and is still having problems with pain in her sides and stomach. What can she do? Carter also wants her results labs. Please call asap.

## 2019-09-06 NOTE — Telephone Encounter (Signed)
Called patient and patient states she has been having a constant pain in her sides and lower abdominal area that has been going on for several months. Patient states that this has been going on since she went to the ER in July. Patient states she has uncontrolled bowel movements and diarrhea. Patient states she can not really eat the only thing she might eat is a piece of toast and a boiled egg. Patient states she would really like to see the provider ASAP. Made appointment for 09/18/2019 In [place] at 9:30.

## 2019-09-07 DIAGNOSIS — M25562 Pain in left knee: Secondary | ICD-10-CM | POA: Diagnosis not present

## 2019-09-11 ENCOUNTER — Other Ambulatory Visit: Payer: Self-pay

## 2019-09-11 ENCOUNTER — Ambulatory Visit (INDEPENDENT_AMBULATORY_CARE_PROVIDER_SITE_OTHER): Payer: Medicare HMO | Admitting: Gastroenterology

## 2019-09-11 ENCOUNTER — Encounter: Payer: Self-pay | Admitting: Gastroenterology

## 2019-09-11 VITALS — BP 128/75 | HR 65 | Temp 98.0°F | Ht 63.0 in | Wt 202.5 lb

## 2019-09-11 DIAGNOSIS — R197 Diarrhea, unspecified: Secondary | ICD-10-CM

## 2019-09-11 NOTE — Progress Notes (Addendum)
[name], MD [address]  [place]  [place], [place] [address]  Main: [phone]  Fax: [phone]   Primary Care Physician: Maryland Pink, MD   Chief Complaint  Patient presents with  . Flank Pain    Patient states that the pain is in both sides and radiates to her back and her abdominal pain. Patient states the pain can go to a 10/10. She has diarrhea at least 3 times a day. She states everytime she eats she has to go to the bathroom with in 30 minutes   . Abnormal Lab    Patient had lab work done on 09/06/2019    HPI: CALENE BARTOSCH is a 73 y.o. female here to discuss symptoms of diarrhea and abdominal cramping.  Symptoms started about a month ago.  Reports taking antibiotics for UTI about 2 months ago.  No nausea or vomiting.  No blood in stool.  No fever or chills.  No weight loss.  Reports mild abdominal pain that starts before her bowel movements and then self resolves.  This is diffuse.  No alarm symptoms present.  Recent CT scan and ultrasound liver Doppler not revealing any etiology of her pain.  Previous small bowel capsule obtained.  Dr. Vira Agar, August 2019 for iron deficiency anemia.  "Much of small bowel was poorly visualized due to material which seemed to float with the capsule camera.  No likely source of bleeding was seen.  Recommend either oral iron or IV iron on an episodic basis."  We also just now 09/13/2019, obtain a capsule endoscopy report from April 2014 done by Herbie [facility] Buccini for Anemia, heme positive stool. Marland Kitchen "Some foam within intestinal lumen in mid portion of small bowel may have obscured focal abnormalities, but this was otherwise a normal exam, specifically without any source of heme positivity seen. Specifically I dont see any masses, ulcerations, inflammatory changes, vascular ectasiae or diverticular change."  Current Outpatient Medications  Medication Sig Dispense Refill  . acetaminophen (TYLENOL) 325 MG tablet Take 2 tablets  (650 mg total) by mouth every 6 (six) hours as needed for mild pain (or Fever >/= 101). 30 tablet 0  . albuterol (PROAIR HFA) 108 (90 Base) MCG/ACT inhaler Inhale 2 puffs into the lungs every 4 (four) hours as needed for wheezing or shortness of breath.     Marland Kitchen alendronate (FOSAMAX) 70 MG tablet Take 70 mg by mouth every Tuesday.     Marland Kitchen amitriptyline (ELAVIL) 25 MG tablet Take by mouth.    . Emollient (CERAVE) CREA Apply 1 application topically daily as needed (for dry skin).     Marland Kitchen gabapentin (NEURONTIN) 300 MG capsule Take by mouth.    . insulin NPH-regular Human (NOVOLIN 70/30) (70-30) 100 UNIT/ML injection Inject 60-70 Units into the skin See admin instructions. 80 units every morning and 60 units every afternoon    . Insulin Syringe-Needle U-100 (INSULIN SYRINGE 1CC/30GX5/16") 30G X 5/16" 1 ML MISC USE ONE TWICE DAILY WITH INSULIN    . losartan (COZAAR) 25 MG tablet Take 25 mg by mouth daily.    . metoprolol tartrate (LOPRESSOR) 100 MG tablet Take by mouth.    . metoprolol tartrate (LOPRESSOR) 50 MG tablet Take 2 tablets (100 mg total) by mouth 2 (two) times daily. (Patient taking differently: Take 150 mg by mouth 2 (two) times daily. ) 60 tablet 0  . omeprazole (PRILOSEC) 40 MG capsule Take 40 mg by mouth 2 (two) times daily.    . pravastatin (  PRAVACHOL) 10 MG tablet Take 1 tablet (10 mg total) by mouth daily at 6 PM. 30 tablet 0  . Semaglutide,0.25 or 0.5MG /DOS, 2 MG/1.5ML SOPN Take 0.25 mg for 4 weeks then increase to 0.5 mg weekly.    Marland Kitchen tiotropium (SPIRIVA HANDIHALER) 18 MCG inhalation capsule Place 18 mcg into inhaler and inhale daily.      No current facility-administered medications for this visit.    Facility-Administered Medications Ordered in Other Visits  Medication Dose Route Frequency Provider Last Rate Last Dose  . heparin lock flush 100 unit/mL  500 Units Intravenous Once Verlon Au, NP      . sodium chloride flush (NS) 0.9 % injection 10 mL  10 mL Intravenous Once Verlon Au, NP        Allergies as of 09/11/2019 - Review Complete 09/11/2019  Allergen Reaction Noted  . Aspirin Other (See Comments) 09/28/2015  . Invokamet [canagliflozin-metformin hcl] Other (See Comments) 08/08/2015  . Sulfa antibiotics Other (See Comments) 04/13/2013  . Lovastatin Rash 08/08/2015  . Penicillins Rash 04/13/2013  . Vicodin [hydrocodone-acetaminophen] Rash 04/13/2013    ROS:  General: Negative for anorexia, weight loss, fever, chills, fatigue, weakness. ENT: Negative for hoarseness, difficulty swallowing , nasal congestion. CV: Negative for chest pain, angina, palpitations, dyspnea on exertion, peripheral edema.  Respiratory: Negative for dyspnea at rest, dyspnea on exertion, cough, sputum, wheezing.  GI: See history of present illness. GU:  Negative for dysuria, hematuria, urinary incontinence, urinary frequency, nocturnal urination.  Endo: Negative for unusual weight change.    Physical Examination:   BP 128/75 (BP Location: Left Arm, Patient Position: Sitting, Cuff Size: Normal)   Pulse 65   Temp 98 F (36.7 C) (Tympanic)   Ht 5\' 3"  (1.6 m)   Wt 202 lb 8 oz (91.9 kg)   BMI 35.87 kg/m   General: Well-nourished, well-developed in no acute distress.  Eyes: No icterus. Conjunctivae pink. Mouth: Oropharyngeal mucosa moist and pink , no lesions erythema or exudate. Neck: Supple, Trachea midline Abdomen: Bowel sounds are normal, nontender, nondistended, no hepatosplenomegaly or masses, no abdominal bruits or hernia , no rebound or guarding.   Extremities: No lower extremity edema. No clubbing or deformities. Neuro: Alert and oriented x 3.  Grossly intact. Skin: Warm and dry, no jaundice.   Psych: Alert and cooperative, normal mood and affect.   Labs: CMP     Component Value Date/Time   NA 134 (L) 07/26/2019 1538   NA 143 05/04/2013 0614   K 4.2 07/26/2019 1538   K 3.8 05/04/2013 0614   CL 97 (L) 07/26/2019 1538   CL 110 (H) 05/04/2013 0614   CO2 27  07/26/2019 1538   CO2 27 05/04/2013 0614   GLUCOSE 349 (H) 07/26/2019 1538   GLUCOSE 130 (H) 05/04/2013 0614   BUN 19 07/26/2019 1538   BUN 12 05/04/2013 0614   CREATININE 0.83 07/26/2019 1538   CREATININE 0.79 08/27/2014 1445   CALCIUM 9.5 07/26/2019 1538   CALCIUM 8.2 (L) 05/04/2013 0614   PROT 7.9 07/26/2019 1538   PROT 7.6 08/27/2014 1445   ALBUMIN 4.2 07/26/2019 1538   ALBUMIN 3.5 08/27/2014 1445   AST 27 07/26/2019 1538   AST 27 08/27/2014 1445   ALT 29 07/26/2019 1538   ALT 27 08/27/2014 1445   ALKPHOS 89 07/26/2019 1538   ALKPHOS 101 08/27/2014 1445   BILITOT 0.8 07/26/2019 1538   BILITOT 0.4 08/27/2014 1445   GFRNONAA >60 07/26/2019 1538   GFRNONAA >60  08/27/2014 1445   GFRAA >60 07/26/2019 1538   GFRAA >60 08/27/2014 1445   Lab Results  Component Value Date   WBC 4.9 08/16/2019   HGB 11.9 (L) 08/16/2019   HCT 36.9 08/16/2019   MCV 87.0 08/16/2019   PLT 244 08/16/2019    Imaging Studies: No results found.  Assessment and Plan:   HADLEI RAMALEY is a 73 y.o. y/o female with diarrhea for the last month antibiotic use prior to that  Will obtain GI profile and given recent antibiotic use  Patient also has chronic malrotation of the gut that has never been evaluated by surgery.  This is asymptomatic, however, patient is willing to get surgical evaluation to obtain their official opinion on this  Previous records sent by [facility] clinic GI reviewed.  They have not show a small bowel capsule report done in July 2019 but will be faxing over shortly.  As per their Epic message to me " morning "No source of Bleeding identified in small bowel. Looks like was then advised to get second opinion. I'll fax over official report too. It reads "much of small bowel was poorly visualized due to material which seemed to float with the capsule camera"   Will have patient follow-up closely after diarrhea work-up to ensure this has resolved and address any previous chronic issues    Dr [name]

## 2019-09-12 ENCOUNTER — Ambulatory Visit
Admission: RE | Admit: 2019-09-12 | Discharge: 2019-09-12 | Disposition: A | Discharge status: Home / Self Care | Payer: Medicare HMO | Attending: Gastroenterology | Admitting: Gastroenterology

## 2019-09-12 ENCOUNTER — Telehealth: Payer: Self-pay

## 2019-09-12 DIAGNOSIS — R197 Diarrhea, unspecified: Secondary | ICD-10-CM | POA: Insufficient documentation

## 2019-09-12 LAB — C DIFFICILE QUICK SCREEN W PCR REFLEX
C Diff antigen: NEGATIVE
C Diff interpretation: NOT DETECTED
C Diff toxin: NEGATIVE

## 2019-09-12 NOTE — Telephone Encounter (Signed)
Patient verbalized understanding. Put in order in computer for GI panel and she will do the test

## 2019-09-12 NOTE — Telephone Encounter (Signed)
Documented in a telephone call

## 2019-09-12 NOTE — Telephone Encounter (Signed)
-----   Message from Virgel Manifold, MD sent at 09/12/2019  3:28 PM EDT ----- Caryl Pina please let the patient know, her C. Diff was negative. What happened to her GI panel? I see it was cancelled

## 2019-09-18 ENCOUNTER — Other Ambulatory Visit: Payer: Self-pay

## 2019-09-18 ENCOUNTER — Other Ambulatory Visit
Admission: RE | Admit: 2019-09-18 | Discharge: 2019-09-18 | Disposition: A | Discharge status: Home / Self Care | Payer: Medicare HMO | Attending: Gastroenterology | Admitting: Gastroenterology

## 2019-09-18 DIAGNOSIS — R197 Diarrhea, unspecified: Secondary | ICD-10-CM | POA: Insufficient documentation

## 2019-09-19 ENCOUNTER — Encounter: Payer: Self-pay | Admitting: Unknown Physician Specialty

## 2019-09-20 DIAGNOSIS — I350 Nonrheumatic aortic (valve) stenosis: Secondary | ICD-10-CM | POA: Diagnosis not present

## 2019-09-22 LAB — GI PATHOGEN PANEL BY PCR, STOOL
Adenovirus F 40/41: NOT DETECTED
Astrovirus: NOT DETECTED
C difficile toxin A/B: NOT DETECTED
Campylobacter by PCR: NOT DETECTED
Cryptosporidium by PCR: NOT DETECTED
Cyclospora cayetanensis: NOT DETECTED
E coli (ETEC) LT/ST: NOT DETECTED
E coli (STEC): NOT DETECTED
Entamoeba histolytica: NOT DETECTED
Enteroaggregative E coli: NOT DETECTED
Enteropathogenic E coli: NOT DETECTED
G lamblia by PCR: NOT DETECTED
Norovirus GI/GII: NOT DETECTED
Plesiomonas shigelloides: NOT DETECTED
Rotavirus A by PCR: NOT DETECTED
Salmonella by PCR: NOT DETECTED
Sapovirus: NOT DETECTED
Shigella by PCR: NOT DETECTED
Vibrio cholerae: NOT DETECTED
Vibrio: NOT DETECTED
Yersinia enterocolitica: NOT DETECTED

## 2019-09-24 ENCOUNTER — Encounter: Payer: Self-pay | Admitting: Unknown Physician Specialty

## 2019-09-25 ENCOUNTER — Telehealth: Payer: Self-pay | Admitting: Gastroenterology

## 2019-09-25 NOTE — Telephone Encounter (Signed)
Pt is calling to check on her stool test results

## 2019-09-25 NOTE — Telephone Encounter (Signed)
Patient verbalized understanding of lab results  

## 2019-09-25 NOTE — Telephone Encounter (Signed)
Can you please advised on patient GI panel results

## 2019-09-25 NOTE — Telephone Encounter (Signed)
Negative GI panel -RV

## 2019-10-01 ENCOUNTER — Ambulatory Visit: Payer: Medicare HMO | Admitting: Gastroenterology

## 2019-10-03 DIAGNOSIS — M25461 Effusion, right knee: Secondary | ICD-10-CM | POA: Diagnosis not present

## 2019-10-03 DIAGNOSIS — M1711 Unilateral primary osteoarthritis, right knee: Secondary | ICD-10-CM | POA: Diagnosis not present

## 2019-10-03 DIAGNOSIS — M25561 Pain in right knee: Secondary | ICD-10-CM | POA: Diagnosis not present

## 2019-10-15 ENCOUNTER — Ambulatory Visit: Payer: Medicare HMO | Admitting: Gastroenterology

## 2019-10-16 ENCOUNTER — Ambulatory Visit: Payer: Medicare HMO | Admitting: Gastroenterology

## 2019-10-23 ENCOUNTER — Encounter: Payer: Self-pay | Admitting: Gastroenterology

## 2019-10-23 ENCOUNTER — Telehealth: Payer: Self-pay

## 2019-10-23 ENCOUNTER — Ambulatory Visit (INDEPENDENT_AMBULATORY_CARE_PROVIDER_SITE_OTHER): Payer: Medicare HMO | Admitting: Gastroenterology

## 2019-10-23 ENCOUNTER — Other Ambulatory Visit: Payer: Self-pay

## 2019-10-23 ENCOUNTER — Other Ambulatory Visit
Admission: RE | Admit: 2019-10-23 | Discharge: 2019-10-23 | Disposition: A | Discharge status: Home / Self Care | Payer: Medicare HMO | Attending: Gastroenterology | Admitting: Gastroenterology

## 2019-10-23 VITALS — BP 128/61 | HR 79 | Temp 97.5°F | Ht 63.0 in | Wt 206.0 lb

## 2019-10-23 DIAGNOSIS — E162 Hypoglycemia, unspecified: Secondary | ICD-10-CM | POA: Diagnosis not present

## 2019-10-23 DIAGNOSIS — R159 Full incontinence of feces: Secondary | ICD-10-CM | POA: Diagnosis not present

## 2019-10-23 NOTE — Progress Notes (Signed)
[name], MD [address]  [place]  [place], [place] [address]  Main: [phone]  Fax: [phone]   Primary Care Physician: Maryland Pink, MD   Chief Complaint  Patient presents with  . Anemia    Patient has not been tired or no symptoms,. She has had diarrhea for every day for 3 -4 times a day     HPI: Joyce Potter is a 73 y.o. female here for follow-up of diarrhea.  Reports 2-3 loose bowel movements a day.  Sometimes they are formed.  Does report episodes of fecal incontinence once or twice a week.  States has to make it to the bathroom immediately or will have an accident.  Also has urinary incontinence.  Has had 5 vaginal deliveries in the past.  No blood in stool.  No weight loss.  No abdominal pain.  No nausea or vomiting.  No dysphagia.  No heartburn.  GI panel was previously negative including C. difficile.  Was also previously found to have hepatomegaly on a CT scan.  Right upper quadrant ultrasound was done and this showed hepatic steatosis.  She underwent serology with viral and autoimmune hepatitis labs which were unrevealing.  She is immune to hepatitis A  Also has previous history of iron deficiency anemia evaluated by Dr. Vira Agar in 2019  Last EGD in July 17 2018 reported gastritis, otherwise normal.  EGD, July 07, 2018, small hiatal hernia and gastritis.  Otherwise normal.  Biopsies were negative for H. pylori.  Colonoscopy, July 07, 2018, with diminutive polyp in the descending colon and transverse colon removed.  Internal hemorrhoids reported.  One was tubular adenoma and one was hyperplastic polyp.  Previous small bowel capsule obtained.  Dr. Vira Agar, August 2019 for iron deficiency anemia.  "Much of small bowel was poorly visualized due to material which seemed to float with the capsule camera.  No likely source of bleeding was seen.  Recommend either oral iron or IV iron on an episodic basis."  capsule endoscopy report from April  2014 done by Herbie [place] Buccini for Anemia, heme positive stool. Marland Kitchen "Some foam within intestinal lumen in mid portion of small bowel may have obscured focal abnormalities, but this was otherwise a normal exam, specifically without any source of heme positivity seen. Specifically I dont see any masses, ulcerations, inflammatory changes, vascular ectasiae or diverticular change."  Current Outpatient Medications  Medication Sig Dispense Refill  . acetaminophen (TYLENOL) 325 MG tablet Take 2 tablets (650 mg total) by mouth every 6 (six) hours as needed for mild pain (or Fever >/= 101). 30 tablet 0  . albuterol (PROAIR HFA) 108 (90 Base) MCG/ACT inhaler Inhale 2 puffs into the lungs every 4 (four) hours as needed for wheezing or shortness of breath.     Marland Kitchen alendronate (FOSAMAX) 70 MG tablet Take 70 mg by mouth every Tuesday.     Marland Kitchen amitriptyline (ELAVIL) 25 MG tablet Take by mouth.    . Emollient (CERAVE) CREA Apply 1 application topically daily as needed (for dry skin).     Marland Kitchen gabapentin (NEURONTIN) 300 MG capsule Take by mouth.    . insulin NPH-regular Human (NOVOLIN 70/30) (70-30) 100 UNIT/ML injection Inject 60-70 Units into the skin See admin instructions. 80 units every morning and 60 units every afternoon    . Insulin Syringe-Needle U-100 (INSULIN SYRINGE 1CC/30GX5/16") 30G X 5/16" 1 ML MISC USE ONE TWICE DAILY WITH INSULIN    . losartan (COZAAR) 25 MG tablet Take 25 mg  by mouth daily.    . metoprolol tartrate (LOPRESSOR) 100 MG tablet Take by mouth.    . metoprolol tartrate (LOPRESSOR) 50 MG tablet Take 2 tablets (100 mg total) by mouth 2 (two) times daily. (Patient taking differently: Take 150 mg by mouth 2 (two) times daily. ) 60 tablet 0  . omeprazole (PRILOSEC) 40 MG capsule Take 40 mg by mouth 2 (two) times daily.    . pravastatin (PRAVACHOL) 10 MG tablet Take 1 tablet (10 mg total) by mouth daily at 6 PM. 30 tablet 0  . Semaglutide,0.25 or 0.5MG /DOS, 2 MG/1.5ML SOPN Take 0.25 mg for 4 weeks then  increase to 0.5 mg weekly.    Marland Kitchen tiotropium (SPIRIVA HANDIHALER) 18 MCG inhalation capsule Place 18 mcg into inhaler and inhale daily.      No current facility-administered medications for this visit.    Facility-Administered Medications Ordered in Other Visits  Medication Dose Route Frequency Provider Last Rate Last Dose  . heparin lock flush 100 unit/mL  500 Units Intravenous Once Verlon Au, NP      . sodium chloride flush (NS) 0.9 % injection 10 mL  10 mL Intravenous Once Verlon Au, NP        Allergies as of 10/23/2019 - Review Complete 10/23/2019  Allergen Reaction Noted  . Aspirin Other (See Comments) 09/28/2015  . Invokamet [canagliflozin-metformin hcl] Other (See Comments) 08/08/2015  . Sulfa antibiotics Other (See Comments) 04/13/2013  . Lovastatin Rash 08/08/2015  . Penicillins Rash 04/13/2013  . Vicodin [hydrocodone-acetaminophen] Rash 04/13/2013    ROS:  General: Negative for anorexia, weight loss, fever, chills, fatigue, weakness. ENT: Negative for hoarseness, difficulty swallowing , nasal congestion. CV: Negative for chest pain, angina, palpitations, dyspnea on exertion, peripheral edema.  Respiratory: Negative for dyspnea at rest, dyspnea on exertion, cough, sputum, wheezing.  GI: See history of present illness. GU:  Negative for dysuria, hematuria, urinary incontinence, urinary frequency, nocturnal urination.  Endo: Negative for unusual weight change.    Physical Examination:   BP 128/61 (BP Location: Left Arm, Patient Position: Sitting, Cuff Size: Normal)   Pulse 79   Temp (!) 97.5 F (36.4 C) (Oral)   Ht 5\' 3"  (1.6 m)   Wt 206 lb (93.4 kg)   BMI 36.49 kg/m   General: Well-nourished, well-developed in no acute distress.  Eyes: No icterus. Conjunctivae pink. Mouth: Oropharyngeal mucosa moist and pink , no lesions erythema or exudate. Neck: Supple, Trachea midline Abdomen: Bowel sounds are normal, nontender, nondistended, no hepatosplenomegaly or  masses, no abdominal bruits or hernia , no rebound or guarding.   Extremities: No lower extremity edema. No clubbing or deformities. Neuro: Alert and oriented x 3.  Grossly intact. Skin: Warm and dry, no jaundice.   Psych: Alert and cooperative, normal mood and affect.   Labs: CMP     Component Value Date/Time   NA 134 (L) 07/26/2019 1538   NA 143 05/04/2013 0614   K 4.2 07/26/2019 1538   K 3.8 05/04/2013 0614   CL 97 (L) 07/26/2019 1538   CL 110 (H) 05/04/2013 0614   CO2 27 07/26/2019 1538   CO2 27 05/04/2013 0614   GLUCOSE 349 (H) 07/26/2019 1538   GLUCOSE 130 (H) 05/04/2013 0614   BUN 19 07/26/2019 1538   BUN 12 05/04/2013 0614   CREATININE 0.83 07/26/2019 1538   CREATININE 0.79 08/27/2014 1445   CALCIUM 9.5 07/26/2019 1538   CALCIUM 8.2 (L) 05/04/2013 0614   PROT 7.9 07/26/2019 1538  PROT 7.6 08/27/2014 1445   ALBUMIN 4.2 07/26/2019 1538   ALBUMIN 3.5 08/27/2014 1445   AST 27 07/26/2019 1538   AST 27 08/27/2014 1445   ALT 29 07/26/2019 1538   ALT 27 08/27/2014 1445   ALKPHOS 89 07/26/2019 1538   ALKPHOS 101 08/27/2014 1445   BILITOT 0.8 07/26/2019 1538   BILITOT 0.4 08/27/2014 1445   GFRNONAA >60 07/26/2019 1538   GFRNONAA >60 08/27/2014 1445   GFRAA >60 07/26/2019 1538   GFRAA >60 08/27/2014 1445   Lab Results  Component Value Date   WBC 4.9 08/16/2019   HGB 11.9 (L) 08/16/2019   HCT 36.9 08/16/2019   MCV 87.0 08/16/2019   PLT 244 08/16/2019    Imaging Studies: No results found.  Assessment and Plan:   Joyce Potter is a 74 y.o. y/o female with fecal incontinence  I suspect her symptoms are due to pelvic floor dysfunction given concomitant urinary incontinence as well and previous history of multiple vaginal deliveries  I have recommended pelvic floor physical therapy and she is agreeable.  Will refer.  Also start Metamucil daily or every other day to help bulk stool  If symptoms get worse patient was advised to call us and she verbalized  understanding  No alarm symptoms present  We will also send patient to the lab today as she states her primary care doctor recently has been increasing her insulin dosage and she feels that she has low sugar.  However, fingerstick this morning was 190 at home.  She will go down to lab to get her fingerstick done now  Dr [name]

## 2019-10-23 NOTE — Telephone Encounter (Signed)
Patient states that her and her husband stopped on the way home to get something to eat. She states now her blood sugar is 140. I told her to inform her PCP what is going on. She states she will call PCP now

## 2019-11-08 DIAGNOSIS — R69 Illness, unspecified: Secondary | ICD-10-CM | POA: Diagnosis not present

## 2019-11-14 DIAGNOSIS — M25562 Pain in left knee: Secondary | ICD-10-CM | POA: Diagnosis not present

## 2019-11-14 DIAGNOSIS — M170 Bilateral primary osteoarthritis of knee: Secondary | ICD-10-CM | POA: Diagnosis not present

## 2019-11-16 DIAGNOSIS — J449 Chronic obstructive pulmonary disease, unspecified: Secondary | ICD-10-CM | POA: Diagnosis not present

## 2019-11-16 DIAGNOSIS — Z794 Long term (current) use of insulin: Secondary | ICD-10-CM | POA: Diagnosis not present

## 2019-11-16 DIAGNOSIS — I25119 Atherosclerotic heart disease of native coronary artery with unspecified angina pectoris: Secondary | ICD-10-CM | POA: Diagnosis not present

## 2019-11-16 DIAGNOSIS — I499 Cardiac arrhythmia, unspecified: Secondary | ICD-10-CM | POA: Diagnosis not present

## 2019-11-16 DIAGNOSIS — I252 Old myocardial infarction: Secondary | ICD-10-CM | POA: Diagnosis not present

## 2019-11-16 DIAGNOSIS — K219 Gastro-esophageal reflux disease without esophagitis: Secondary | ICD-10-CM | POA: Diagnosis not present

## 2019-11-16 DIAGNOSIS — M1990 Unspecified osteoarthritis, unspecified site: Secondary | ICD-10-CM | POA: Diagnosis not present

## 2019-11-16 DIAGNOSIS — I10 Essential (primary) hypertension: Secondary | ICD-10-CM | POA: Diagnosis not present

## 2019-11-16 DIAGNOSIS — E1142 Type 2 diabetes mellitus with diabetic polyneuropathy: Secondary | ICD-10-CM | POA: Diagnosis not present

## 2019-11-16 DIAGNOSIS — G8929 Other chronic pain: Secondary | ICD-10-CM | POA: Diagnosis not present

## 2019-11-20 DIAGNOSIS — M170 Bilateral primary osteoarthritis of knee: Secondary | ICD-10-CM | POA: Diagnosis not present

## 2019-11-20 DIAGNOSIS — M25562 Pain in left knee: Secondary | ICD-10-CM | POA: Diagnosis not present

## 2019-11-27 ENCOUNTER — Ambulatory Visit: Payer: Medicare HMO | Admitting: Physical Therapy

## 2019-11-28 ENCOUNTER — Ambulatory Visit: Payer: Medicare HMO | Attending: Gastroenterology | Admitting: Physical Therapy

## 2019-11-28 ENCOUNTER — Encounter: Payer: Self-pay | Admitting: Physical Therapy

## 2019-11-28 ENCOUNTER — Other Ambulatory Visit: Payer: Self-pay

## 2019-11-28 DIAGNOSIS — M6281 Muscle weakness (generalized): Secondary | ICD-10-CM | POA: Diagnosis not present

## 2019-11-28 DIAGNOSIS — R102 Pelvic and perineal pain: Secondary | ICD-10-CM

## 2019-11-28 DIAGNOSIS — R278 Other lack of coordination: Secondary | ICD-10-CM | POA: Insufficient documentation

## 2019-11-28 DIAGNOSIS — R293 Abnormal posture: Secondary | ICD-10-CM | POA: Diagnosis not present

## 2019-11-28 NOTE — Therapy (Signed)
[facility] [address] [place], [place], [address] Phone: [phone]   Fax:  [phone]  Physical Therapy Evaluation  Patient Details  Name: Joyce Potter MRN: [id number] Date of Birth: 1946-07-18 Referring Provider (PT): Norberta Keens   Encounter Date: 11/28/2019  PT End of Session - 11/28/19 0912    Visit Number  1    Number of Visits  13    Date for PT Re-Evaluation  02/20/20    Authorization Type  IE 11/28/2019    PT Start Time  0900    PT Stop Time  0955    PT Time Calculation (min)  55 min    Activity Tolerance  Patient tolerated treatment well    Behavior During Therapy  Anxious;WFL for tasks assessed/performed       Past Medical History:  Diagnosis Date  . Anemia    iron deficiency  . Aortic stenosis   . Basal cell carcinoma   . CAD (coronary artery disease)    s/p Left circumflex stent in 2012  . Carotid stenosis   . Cataract   . High cholesterol   . HTN (hypertension)   . Hyperlipidemia   . IDDM (insulin dependent diabetes mellitus)   . Multiple thyroid nodules    Benign  . Peptic ulcer disease   . Retinal artery occlusion    on left    Past Surgical History:  Procedure Laterality Date  . ABDOMINAL HYSTERECTOMY    . ARTERY BIOPSY Left 11/19/2015   Procedure: BIOPSY TEMPORAL ARTERY;  Surgeon: Algernon Huxley, MD;  Location: [facility] ORS;  Service: Vascular;  Laterality: Left;  . BREAST BIOPSY Left 2002   core- neg  . CARDIAC CATHETERIZATION N/A 08/08/2015   Procedure: Right and Left Heart Cath and Coronary Angiography;  Surgeon: Teodoro Spray, MD;  Location: [place] [facility];  Service: Cardiovascular;  Laterality: N/A;  . CARDIAC CATHETERIZATION Bilateral 09/03/2016   Procedure: Right/Left Heart Cath and Coronary Angiography;  Surgeon: Teodoro Spray, MD;  Location: [place] [facility];  Service: Cardiovascular;  Laterality: Bilateral;  . CATARACT EXTRACTION W/ INTRAOCULAR LENS  IMPLANT, BILATERAL     . COLONOSCOPY     in 2013- internal hemorrhoids  . COLONOSCOPY WITH PROPOFOL N/A 07/07/2018   Procedure: COLONOSCOPY WITH PROPOFOL;  Surgeon: Manya Silvas, MD;  Location: [facility] ENDOSCOPY;  Service: Endoscopy;  Laterality: N/A;  . CORONARY ANGIOGRAPHY N/A 09/14/2017   Procedure: CORONARY ANGIOGRAPHY;  Surgeon: Teodoro Spray, MD;  Location: [place] [facility];  Service: Cardiovascular;  Laterality: N/A;  . CORONARY ANGIOPLASTY    . CORONARY STENT PLACEMENT    . ENDARTERECTOMY Left 10/01/2015   Procedure: ENDARTERECTOMY CAROTID;  Surgeon: Algernon Huxley, MD;  Location: [facility] ORS;  Service: Vascular;  Laterality: Left;  . ESOPHAGOGASTRODUODENOSCOPY    . ESOPHAGOGASTRODUODENOSCOPY (EGD) WITH PROPOFOL N/A 05/31/2016   Procedure: ESOPHAGOGASTRODUODENOSCOPY (EGD) WITH PROPOFOL;  Surgeon: Manya Silvas, MD;  Location: [facility] ENDOSCOPY;  Service: Endoscopy;  Laterality: N/A;  . ESOPHAGOGASTRODUODENOSCOPY (EGD) WITH PROPOFOL N/A 07/07/2018   Procedure: ESOPHAGOGASTRODUODENOSCOPY (EGD) WITH PROPOFOL;  Surgeon: Manya Silvas, MD;  Location: [facility] ENDOSCOPY;  Service: Endoscopy;  Laterality: N/A;  . ESOPHAGOGASTRODUODENOSCOPY (EGD) WITH PROPOFOL N/A 07/17/2018   Procedure: ESOPHAGOGASTRODUODENOSCOPY (EGD) WITH PROPOFOL;  Surgeon: Lucilla Lame, MD;  Location: [facility] ENDOSCOPY;  Service: Endoscopy;  Laterality: N/A;  . EYE SURGERY    . GIVENS CAPSULE STUDY N/A 04/17/2013   Procedure: GIVENS CAPSULE STUDY;  Surgeon:  Arta Silence, MD;  Location: [facility] ENDOSCOPY;  Service: Endoscopy;  Laterality: N/A;  patient ate breakfast at 7am   . HEMORRHOID BANDING  07/27/2016   Dr Viona Gilmore. Tamala Julian  . PARTIAL HYSTERECTOMY    . RIGHT AND LEFT HEART CATH Bilateral 09/14/2017   Procedure: RIGHT AND LEFT HEART CATH;  Surgeon: Teodoro Spray, MD;  Location: [place] [facility];  Service: Cardiovascular;  Laterality: Bilateral;  . TRIGGER FINGER RELEASE      There were no vitals filed for this visit.       [facility] PT Assessment  - 11/28/19 0001      Assessment   Medical Diagnosis  Fecal Incontinence    Referring Provider (PT)  Bonna Gains, V    Onset Date/Surgical Date  11/27/18    Hand Dominance  Right    Next MD Visit  01/2020    Prior Therapy  none for this dx      Precautions   Precautions  Fall      Balance Screen   Has the patient fallen in the past 6 months  No       Pelvic Floor Physical Therapy Evaluation and Assessment  SCREENING Red Flags: None Have you had any night sweats? Unexplained weight loss? Saddle anesthesia? Unexplained changes in bowel or bladder habits?  SUBJECTIVE  Patient reports: "all I have to do is move" to have urinary incontinence. Patient notes that when her stomach is upset that she has high urgency for BM and no warning before she has incontinence. Patient has 100% of the time SUI and UUI. Patient estimates ~5-6 urinations/day. Patient consumes 24 oz. coffee, 48 oz water, and lemon flavored water occasionally. Patient has been married 2 years and her husband has dementia; she is primary caregiver and notes increasead stress from this activity.   Patient has BMs 3x/day. Patient identifies Type 7 on [place] Stool Chart. Patient's diet consists of : pancakes with sugar free syrup/oatmeal with toast/ scrambled eggs with [place] bacon/ boiled eggs; [place] sandwich/ grilled chicken breast/ pinto beans with creamed potatoes/ veggie casserole with ranch/ broccoli and cheese/ turnip greens.   Patient denies any heaviness/pressure in pelvis.   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 Patient watches TV for fun.   Recent Procedures/Tests/Findings:  Liver doppler (unremarkable), CT of pelvis and abdomen showing congenital malrotation of bowel  Obstetrical History: 2 miscarriages; 5 vaginal deliveries, patient had 3 episiotomies.   Gynecological History: Partial hysterectomy; patient notes she  has had uterus prolapse after a fall and was able to self-manipulate the uterus back into place. Hx of intermittent pain with exam.   Urinary History: Patient is + for toilet mapping in past. Patient   Gastrointestinal History: Patient reports GI MD told her that her digestive system has shifted to the R. Patient has had GI bleeds in the past with 4 hemorrhoids. Patient notes gray looking stool. All 4 were banded; only 1 remains. Patient has had increased flank pain and an inability to sit up since having the procedure. Patient notes occasional gray stool.   Sexual activity/pain: Hx of intermittent pain with intercourse.   Location of pain: B flanks and suprapubic area Current pain:  4/10  Max pain:  8/10 Least pain:  0/10 (laying straight resting) Nature of pain: cramping   Patient Goals: Eliminating pain; being able to not wear a pad 24/7, controlling bowel and bladder   OBJECTIVE  Posture/Observations:  Sitting: increased R  rotation of spine, increased trunk flexion, hip adduction Standing: L lateral flexion of spine with L PSIS posterior to R  Palpation/Segmental Motion/Joint Play: deferred 2/2 time  Range of Motion/Flexibilty: deferred 2/2 time Spine:  Hips:   Strength/MMT: deferred 2/2 time LE MMT  LE MMT Left Right  Hip flex:  (L2) /5 /5  Hip ext: /5 /5  Hip abd: /5 /5  Hip add: /5 /5  Hip IR /5 /5  Hip ER /5 /5  *Patient has difficulty standing without use of hands indicating max 3+/5 gross BLE strength.   Abdominal: deferred 2/2 time Palpation: Diastasis:  Pelvic Floor External Exam: deferred 2/2 time Introitus Appears:  Skin integrity:  Palpation: Cough: Prolapse visible?: Scar mobility:  Internal Vaginal Exam: deferred 2/2 time Strength (PERF):  Symmetry: Palpation: Prolapse:   Internal Rectal Exam: deferred 2/2 time Strength (PERF): Symmetry: Palpation: Prolapse:   Gait Analysis: lilting gait with + Trendelenburg bilaterally,  increased L lateral trunk flexion, decreased B knee flexion with wide BOS.    Pelvic Floor Outcome Measures: FISI (32/61), PFDI-20 (138.53/300; POPDI 8.3%, CRAD-8 46.9%, UDI 83.3%), ICIQ-UI (20/21)   Objective measurements completed on examination: See above findings.   ASSESSMENT Patient is a 73 year old presenting to clinic with chief complaints of pelvic pain, fecal and urinary incontinence. Upon examination, patient demonstrates deficits in PFM strength and coordination, scar mobility, pain, posture, gait, and balance as evidenced by >2 bouts of fecal incontinence in a given 7 day period, mixed UI episodes multiple times/day, increased L lateral trunk flexion in standing, + Trendelenburg gait bilaterally, and history of falling. Patient's responses on FISI, PFDI-20, and ICIQ-UI outcome measures (respectively (32/61), (138.53/300; POPDI 8.3%, CRAD-8 46.9%, UDI 83.3%), (20/21)) indicate severe functional limitations/disability/distress. Patient's progress may be limited due to demands of caregiving for husband with cognitive decline and persistence of symptoms; however, patient's motivation is advantageous. Patient was able to achieve basic understanding of pelvic floor muscle function and importance of managing intra-abdominal pressures during today's evaluation and responded positively to educational interventions. Patient will benefit from continued skilled therapeutic intervention to address deficits in PFM strength and coordination, scar mobility, pain, posture, gait, and balance in order to increase function and improve overall QOL.  EDUCATION Patient educated on prognosis, POC, and provided with HEP including: bowel diary. Patient articulated understanding and returned demonstration. Patient will benefit from further education in order to maximize compliance and understanding for long-term therapeutic gains.   TREATMENT  Neuromuscular Re-education: Patient educated on primary functions of  the pelvic floor including: posture/balance, sexual pleasure, storage and elimination of waste from the body, abdominal cavity closure, and breath coordination.  Patient educated on basics of intra-abdominal pressure system and impacts on pressure from breath patterns, PFM, and core musculature.      PT Long Term Goals - 11/28/19 1302      PT LONG TERM GOAL #1   Title  Patient will report BMs classified as Type 3-Type 4 on the [facility] Stool Chart greater than 25% of the time to demonstrate improved motility and stool bulking in order to decrease fecal distress and improve overall QOL.    Baseline  IE: 0%    Time  12    Period  Weeks    Status  New    Target Date  02/20/20      PT LONG TERM GOAL #2   Title  Patient will report >1 weeks without episode of fecal incontinence/smearing for improved function and participation at home and  in the community.    Baseline  IE: >2x/week    Time  12    Period  Weeks    Status  New    Target Date  02/20/20      PT LONG TERM GOAL #3   Title  Patient will report decreased bowel frequency from worst (looser stools Type 6/7) 21 x / week ( 3 x / day) to < 14 x/ week to demonstrate improved bowel and PFM function for decrease limitations of participation at home and in the community.    Baseline  IE: 21x/week    Time  12    Period  Weeks    Status  New    Target Date  02/20/20      PT LONG TERM GOAL #4   Title  Patient will indicate at least a 45 point difference on the PFDI-20 form to demonstrate clinically significant improvement of PFM for a return to PLOF at home and in the community.    Baseline  IE: (138.53/300; POPDI 8.3%, CRAD-8 46.9%, UDI 83.3%)    Time  12    Period  Weeks    Status  New    Target Date  02/20/20      PT LONG TERM GOAL #5   Title  Patient will indicate >/= a 3.56 point decrease or a score below 30 on the FISI to demonstrate clinically significant improvement in fecal incontinence for improved participation and overall  QOL.    Baseline  IE: 32/61    Time  12    Period  Weeks    Status  New    Target Date  02/20/20      Additional Long Term Goals   Additional Long Term Goals  Yes      PT LONG TERM GOAL #6   Title  Patient will indicate at least a 5 point decrease on the ICIQ-UI Short Form to demonstrate clinically significant improvement in incontinence symptoms for an improvement in overall QOL.    Baseline  IE: 20    Time  12    Period  Weeks    Status  New    Target Date  02/20/20             Plan - 11/28/19 0912    Clinical Impression Statement  Patient is a 73 year old presenting to clinic with chief complaints of pelvic pain, fecal and urinary incontinence. Upon examination, patient demonstrates deficits in PFM strength and coordination, scar mobility, pain, posture, gait, and balance as evidenced by >2 bouts of fecal incontinence in a given 7 day period, mixed UI episodes multiple times/day, increased L lateral trunk flexion in standing, + Trendelenburg gait bilaterally, and history of falling. Patient's responses on FISI, PFDI-20, and ICIQ-UI outcome measures (respectively (32/61), (138.53/300; POPDI 8.3%, CRAD-8 46.9%, UDI 83.3%), (20/21)) indicate severe functional limitations/disability/distress. Patient's progress may be limited due to demands of caregiving for husband with cognitive decline and persistence of symptoms; however, patient's motivation is advantageous. Patient was able to achieve basic understanding of pelvic floor muscle function and importance of managing intra-abdominal pressures during today's evaluation and responded positively to educational interventions. Patient will benefit from continued skilled therapeutic intervention to address deficits in PFM strength and coordination, scar mobility, pain, posture, gait, and balance in order to increase function and improve overall QOL.    Personal Factors and Comorbidities  Comorbidity 3+;Past/Current Experience;Fitness;Age;Time  since onset of injury/illness/exacerbation;Sex;Profession    Comorbidities  CAD, anemia, aortic stenosis,  DM, HTN, cataract, hx of GI bleed, osteoporosis    Examination-Activity Limitations  Squat;Lift;Hygiene/Grooming;Stairs;Caring for Others;Continence;Transfers;Toileting;Stand    Examination-Participation Restrictions  Laundry;Shop;Cleaning;Yard Work;Meal Prep;Interpersonal Relationship    Stability/Clinical Decision Making  Evolving/Moderate complexity    Clinical Decision Making  Moderate    Rehab Potential  Fair    PT Frequency  1x / week    PT Duration  12 weeks    PT Treatment/Interventions  ADLs/Self Care Home Management;Aquatic Therapy;Biofeedback;Cryotherapy;Electrical Stimulation;Moist Heat;Functional mobility Arts administrator;Therapeutic activities;Therapeutic exercise;Balance training;Neuromuscular re-education;Manual techniques;Dry needling;Passive range of motion;Scar mobilization;Taping;Patient/family education    PT Next Visit Plan  External PFM assessment; bowel retraining education, IAP basics    PT Home Exercise Plan  Bowel Diary    Consulted and Agree with Plan of Care  Patient       Patient will benefit from skilled therapeutic intervention in order to improve the following deficits and impairments:  Abnormal gait, Improper body mechanics, Pain, Postural dysfunction, Decreased scar mobility, Decreased coordination, Decreased balance, Difficulty walking, Decreased range of motion, Decreased endurance, Decreased strength, Hypomobility  Visit Diagnosis: Muscle weakness (generalized)  Other lack of coordination  Abnormal posture  Pelvic pain     Problem List Patient Active Problem List   Diagnosis Date Noted  . Background diabetic retinopathy associated with type 2 diabetes mellitus ([place]) 06/06/2019  . Uncontrolled type 2 diabetes mellitus with hyperglycemia ([place]) 04/30/2019  . Osteoporosis, post-menopausal 04/30/2019  . Multiple rib  fractures 10/11/2018  . GIB (gastrointestinal bleeding) 07/17/2018  . Melena   . Symptomatic anemia   . PVD (peripheral vascular disease) ([place]) 02/11/2017  . Iron deficiency anemia due to chronic blood loss 07/13/2016  . Multinodular goiter 01/25/2016  . Central retinal artery occlusion 10/01/2015  . Carotid stenosis 10/01/2015  . Stroke ([place]) 09/28/2015  . Aortic stenosis, moderate 11/29/2014  . PUD (peptic ulcer disease) 07/17/2014  . HLD (hyperlipidemia) 05/07/2013  . GI bleed from an occult source with recurrent chronic blood loss anemia 04/14/2013  . CAD (coronary artery disease) with demand ischemia from anemia 04/14/2013  . IDDM (insulin dependent diabetes mellitus) 04/14/2013  . HTN (hypertension) 04/14/2013  . Chest pain 04/14/2013   Myles Gip PT, DPT [phone] 11/28/2019, 2:14 PM  [facility] [address] [place], [place], [address] Phone: [phone]   Fax:  [phone]  Name: Joyce Potter MRN: [id number] Date of Birth: 02/05/1946

## 2019-12-04 ENCOUNTER — Ambulatory Visit: Payer: Medicare HMO | Admitting: Physical Therapy

## 2019-12-04 DIAGNOSIS — L720 Epidermal cyst: Secondary | ICD-10-CM | POA: Diagnosis not present

## 2019-12-04 DIAGNOSIS — L570 Actinic keratosis: Secondary | ICD-10-CM | POA: Diagnosis not present

## 2019-12-05 ENCOUNTER — Ambulatory Visit: Payer: Medicare HMO | Admitting: Physical Therapy

## 2019-12-10 ENCOUNTER — Other Ambulatory Visit: Payer: Self-pay | Admitting: Oncology

## 2019-12-10 DIAGNOSIS — E042 Nontoxic multinodular goiter: Secondary | ICD-10-CM | POA: Diagnosis not present

## 2019-12-10 DIAGNOSIS — E1159 Type 2 diabetes mellitus with other circulatory complications: Secondary | ICD-10-CM | POA: Diagnosis not present

## 2019-12-11 ENCOUNTER — Other Ambulatory Visit: Payer: Self-pay

## 2019-12-11 ENCOUNTER — Ambulatory Visit: Payer: Medicare HMO | Admitting: Physical Therapy

## 2019-12-11 ENCOUNTER — Encounter: Payer: Self-pay | Admitting: Physical Therapy

## 2019-12-11 DIAGNOSIS — R293 Abnormal posture: Secondary | ICD-10-CM

## 2019-12-11 DIAGNOSIS — M6281 Muscle weakness (generalized): Secondary | ICD-10-CM

## 2019-12-11 DIAGNOSIS — R278 Other lack of coordination: Secondary | ICD-10-CM

## 2019-12-11 DIAGNOSIS — R102 Pelvic and perineal pain: Secondary | ICD-10-CM | POA: Diagnosis not present

## 2019-12-11 NOTE — Therapy (Signed)
[place] [facility] [address]. [place], [place], [address] Phone: [phone]   Fax:  [phone]  Physical Therapy Treatment  Patient Details  Name: Joyce Potter MRN: [id number] Date of Birth: March 16, 1946 Referring Provider (PT): Norberta Keens   Encounter Date: 12/11/2019  PT End of Session - 12/11/19 0904    Visit Number  2    Number of Visits  13    Date for PT Re-Evaluation  02/20/20    Authorization Type  IE 11/28/2019    PT Start Time  0900    PT Stop Time  0955    PT Time Calculation (min)  55 min    Activity Tolerance  Patient tolerated treatment well    Behavior During Therapy  [facility] for tasks assessed/performed       Past Medical History:  Diagnosis Date  . Anemia    iron deficiency  . Aortic stenosis   . Basal cell carcinoma   . CAD (coronary artery disease)    s/p Left circumflex stent in 2012  . Carotid stenosis   . Cataract   . High cholesterol   . HTN (hypertension)   . Hyperlipidemia   . IDDM (insulin dependent diabetes mellitus)   . Multiple thyroid nodules    Benign  . Peptic ulcer disease   . Retinal artery occlusion    on left    Past Surgical History:  Procedure Laterality Date  . ABDOMINAL HYSTERECTOMY    . ARTERY BIOPSY Left 11/19/2015   Procedure: BIOPSY TEMPORAL ARTERY;  Surgeon: Algernon Huxley, MD;  Location: [facility] ORS;  Service: Vascular;  Laterality: Left;  . BREAST BIOPSY Left 2002   core- neg  . CARDIAC CATHETERIZATION N/A 08/08/2015   Procedure: Right and Left Heart Cath and Coronary Angiography;  Surgeon: Teodoro Spray, MD;  Location: [place] CV LAB;  Service: Cardiovascular;  Laterality: N/A;  . CARDIAC CATHETERIZATION Bilateral 09/03/2016   Procedure: Right/Left Heart Cath and Coronary Angiography;  Surgeon: Teodoro Spray, MD;  Location: [place] CV LAB;  Service: Cardiovascular;  Laterality: Bilateral;  . CATARACT EXTRACTION W/ INTRAOCULAR LENS  IMPLANT, BILATERAL    .  COLONOSCOPY     in 2013- internal hemorrhoids  . COLONOSCOPY WITH PROPOFOL N/A 07/07/2018   Procedure: COLONOSCOPY WITH PROPOFOL;  Surgeon: Manya Silvas, MD;  Location: [facility] ENDOSCOPY;  Service: Endoscopy;  Laterality: N/A;  . CORONARY ANGIOGRAPHY N/A 09/14/2017   Procedure: CORONARY ANGIOGRAPHY;  Surgeon: Teodoro Spray, MD;  Location: [place] CV LAB;  Service: Cardiovascular;  Laterality: N/A;  . CORONARY ANGIOPLASTY    . CORONARY STENT PLACEMENT    . ENDARTERECTOMY Left 10/01/2015   Procedure: ENDARTERECTOMY CAROTID;  Surgeon: Algernon Huxley, MD;  Location: [facility] ORS;  Service: Vascular;  Laterality: Left;  . ESOPHAGOGASTRODUODENOSCOPY    . ESOPHAGOGASTRODUODENOSCOPY (EGD) WITH PROPOFOL N/A 05/31/2016   Procedure: ESOPHAGOGASTRODUODENOSCOPY (EGD) WITH PROPOFOL;  Surgeon: Manya Silvas, MD;  Location: [facility] ENDOSCOPY;  Service: Endoscopy;  Laterality: N/A;  . ESOPHAGOGASTRODUODENOSCOPY (EGD) WITH PROPOFOL N/A 07/07/2018   Procedure: ESOPHAGOGASTRODUODENOSCOPY (EGD) WITH PROPOFOL;  Surgeon: Manya Silvas, MD;  Location: [facility] ENDOSCOPY;  Service: Endoscopy;  Laterality: N/A;  . ESOPHAGOGASTRODUODENOSCOPY (EGD) WITH PROPOFOL N/A 07/17/2018   Procedure: ESOPHAGOGASTRODUODENOSCOPY (EGD) WITH PROPOFOL;  Surgeon: Lucilla Lame, MD;  Location: [facility] ENDOSCOPY;  Service: Endoscopy;  Laterality: N/A;  . EYE SURGERY    . GIVENS CAPSULE STUDY N/A 04/17/2013   Procedure: GIVENS CAPSULE STUDY;  Surgeon:  Arta Silence, MD;  Location: [facility] ENDOSCOPY;  Service: Endoscopy;  Laterality: N/A;  patient ate breakfast at 7am   . HEMORRHOID BANDING  07/27/2016   Dr Viona Gilmore. Tamala Julian  . PARTIAL HYSTERECTOMY    . RIGHT AND LEFT HEART CATH Bilateral 09/14/2017   Procedure: RIGHT AND LEFT HEART CATH;  Surgeon: Teodoro Spray, MD;  Location: [place] CV LAB;  Service: Cardiovascular;  Laterality: Bilateral;  . TRIGGER FINGER RELEASE      There were no vitals filed for this visit.  Subjective Assessment - 12/11/19  0901    Subjective  Patient notes that there haven't been any significant changes since her last appointment regarding her bowel/bladder concerns. Patient reports that she had have a spot lanced on her arm, which she feels is healing nicely. Patient denies any pain/soreness.    Currently in Pain?  No/denies       TREATMENT  Self-Care and Home Management: Patient educated on fecal incontinence, bowel control, bowel retraining, and the important role fiber plays in bulking stool. Patient was provided with handouts discussing the aforementioned topics.  Patient also educated on use of breath(exhalation) when transferring from sitting to standing for better PFM support and to prevent loss of urine/feces.    ASSESSMENT Patient presents to clinic with excellent motivation to participate in therapy. Patient demonstrates deficits in PFM strength and coordination, scar mobility, pain, posture, gait, and balance. Patient able to achieve basic understanding of typical bowel function and importance of consistency with bowel retraining during today's session and responded positively to educational interventions. Patient will benefit from continued skilled therapeutic intervention to address remaining deficits in PFM strength and coordination, scar mobility, pain, posture, gait, and balance in order to increase function and improve overall QOL.      PT Long Term Goals - 11/28/19 1302      PT LONG TERM GOAL #1   Title  Patient will report BMs classified as Type 3-Type 4 on the [facility] Stool Chart greater than 25% of the time to demonstrate improved motility and stool bulking in order to decrease fecal distress and improve overall QOL.    Baseline  IE: 0%    Time  12    Period  Weeks    Status  New    Target Date  02/20/20      PT LONG TERM GOAL #2   Title  Patient will report >1 weeks without episode of fecal incontinence/smearing for improved function and participation at home and in the community.     Baseline  IE: >2x/week    Time  12    Period  Weeks    Status  New    Target Date  02/20/20      PT LONG TERM GOAL #3   Title  Patient will report decreased bowel frequency from worst (looser stools Type 6/7) 21 x / week ( 3 x / day) to < 14 x/ week to demonstrate improved bowel and PFM function for decrease limitations of participation at home and in the community.    Baseline  IE: 21x/week    Time  12    Period  Weeks    Status  New    Target Date  02/20/20      PT LONG TERM GOAL #4   Title  Patient will indicate at least a 45 point difference on the PFDI-20 form to demonstrate clinically significant improvement of PFM for a return to PLOF at home and in the community.  Baseline  IE: (138.53/300; POPDI 8.3%, CRAD-8 46.9%, UDI 83.3%)    Time  12    Period  Weeks    Status  New    Target Date  02/20/20      PT LONG TERM GOAL #5   Title  Patient will indicate >/= a 3.56 point decrease or a score below 30 on the FISI to demonstrate clinically significant improvement in fecal incontinence for improved participation and overall QOL.    Baseline  IE: 32/61    Time  12    Period  Weeks    Status  New    Target Date  02/20/20      Additional Long Term Goals   Additional Long Term Goals  Yes      PT LONG TERM GOAL #6   Title  Patient will indicate at least a 5 point decrease on the ICIQ-UI Short Form to demonstrate clinically significant improvement in incontinence symptoms for an improvement in overall QOL.    Baseline  IE: 20    Time  12    Period  Weeks    Status  New    Target Date  02/20/20            Plan - 12/11/19 [id number]    Clinical Impression Statement  Patient presents to clinic with excellent motivation to participate in therapy. Patient demonstrates deficits in PFM strength and coordination, scar mobility, pain, posture, gait, and balance. Patient able to achieve basic understanding of typical bowel function and importance of consistency with bowel retraining  during today's session and responded positively to educational interventions. Patient will benefit from continued skilled therapeutic intervention to address remaining deficits in PFM strength and coordination, scar mobility, pain, posture, gait, and balance in order to increase function and improve overall QOL.    Personal Factors and Comorbidities  Comorbidity 3+;Past/Current Experience;Fitness;Age;Time since onset of injury/illness/exacerbation;Sex;Profession    Comorbidities  CAD, anemia, aortic stenosis, DM, HTN, cataract, hx of GI bleed, osteoporosis    Examination-Activity Limitations  Squat;Lift;Hygiene/Grooming;Stairs;Caring for Others;Continence;Transfers;Toileting;Stand    Examination-Participation Restrictions  Laundry;Shop;Cleaning;Yard Work;Meal Prep;Interpersonal Relationship    Stability/Clinical Decision Making  Evolving/Moderate complexity    Rehab Potential  Fair    PT Frequency  1x / week    PT Duration  12 weeks    PT Treatment/Interventions  ADLs/Self Care Home Management;Aquatic Therapy;Biofeedback;Cryotherapy;Electrical Stimulation;Moist Heat;Functional mobility Arts administrator;Therapeutic activities;Therapeutic exercise;Balance training;Neuromuscular re-education;Manual techniques;Dry needling;Passive range of motion;Scar mobilization;Taping;Patient/family education    PT Next Visit Plan  External PFM assessment, IAP basics    PT Home Exercise Plan  Bowel Retraining    Recommended Other Services  Dietician    Consulted and Agree with Plan of Care  Patient       Patient will benefit from skilled therapeutic intervention in order to improve the following deficits and impairments:  Abnormal gait, Improper body mechanics, Pain, Postural dysfunction, Decreased scar mobility, Decreased coordination, Decreased balance, Difficulty walking, Decreased range of motion, Decreased endurance, Decreased strength, Hypomobility  Visit Diagnosis: Muscle weakness  (generalized)  Other lack of coordination  Abnormal posture  Pelvic pain     Problem List Patient Active Problem List   Diagnosis Date Noted  . Background diabetic retinopathy associated with type 2 diabetes mellitus ([place]) 06/06/2019  . Uncontrolled type 2 diabetes mellitus with hyperglycemia ([place]) 04/30/2019  . Osteoporosis, post-menopausal 04/30/2019  . Multiple rib fractures 10/11/2018  . GIB (gastrointestinal bleeding) 07/17/2018  . Melena   . Symptomatic anemia   . PVD (  peripheral vascular disease) ([place]) 02/11/2017  . Iron deficiency anemia due to chronic blood loss 07/13/2016  . Multinodular goiter 01/25/2016  . Central retinal artery occlusion 10/01/2015  . Carotid stenosis 10/01/2015  . Stroke ([place]) 09/28/2015  . Aortic stenosis, moderate 11/29/2014  . PUD (peptic ulcer disease) 07/17/2014  . HLD (hyperlipidemia) 05/07/2013  . GI bleed from an occult source with recurrent chronic blood loss anemia 04/14/2013  . CAD (coronary artery disease) with demand ischemia from anemia 04/14/2013  . IDDM (insulin dependent diabetes mellitus) 04/14/2013  . HTN (hypertension) 04/14/2013  . Chest pain 04/14/2013   Myles Gip PT, DPT [phone] 12/11/2019, 12:00 PM  [facility] [address]. [place], [place], [address] Phone: [phone]   Fax:  [phone]  Name: PHILA CHARLES MRN: [id number] Date of Birth: 11-Jun-1946

## 2019-12-13 NOTE — [place] Notes (Signed)
[place]  [phone] [phone]  ID: Joyce Potter OB: 23-Dec-1946  MR#: [id number]  [id number]#:[id number]  Patient Care Team: [place] Pink, MD as PCP - General (Family Medicine) Manya Silvas, MD (Inactive) (Gastroenterology) Lloyd Huger, MD as Consulting Physician (Hematology and Oncology)  CHIEF COMPLAINT: Iron deficiency anemia, secondary to chronic blood loss.  INTERVAL HISTORY: Patient returns to clinic today for repeat laboratory work and further evaluation.  She continues to have chronic weakness and fatigue.  She had an area on her lower extremity frozen off recently by dermatology, but otherwise feels well.  She has no neurologic complaints.  She has a good appetite and denies weight loss.  She denies any recent fevers or illnesses.  She denies chest pain, shortness of breath, cough or hemoptysis. She denies any nausea, vomiting, constipation, or diarrhea.  She has no melena or hematochezia.  She has no urinary complaints.  Patient offers no further specific complaints today.  REVIEW OF SYSTEMS:   Review of Systems  Constitutional: Negative.  Negative for diaphoresis, fever, malaise/fatigue and weight loss.  Eyes: Negative.  Negative for blurred vision.  Respiratory: Negative.  Negative for cough, hemoptysis and shortness of breath.   Cardiovascular: Negative.  Negative for chest pain and leg swelling.  Gastrointestinal: Negative for abdominal pain, blood in stool and melena.  Genitourinary: Negative.  Negative for flank pain and hematuria.  Musculoskeletal: Negative.  Negative for falls and joint pain.  Skin: Negative.  Negative for rash.  Neurological: Negative.  Negative for dizziness, focal weakness, weakness and headaches.  Psychiatric/Behavioral: Negative.  The patient is not nervous/anxious.    As per HPI. Otherwise, a complete review of systems is negative.  PAST MEDICAL HISTORY: Past Medical History:  Diagnosis  Date  . Anemia    iron deficiency  . Aortic stenosis   . Basal cell carcinoma   . CAD (coronary artery disease)    s/p Left circumflex stent in 2012  . Carotid stenosis   . Cataract   . High cholesterol   . HTN (hypertension)   . Hyperlipidemia   . IDDM (insulin dependent diabetes mellitus)   . Multiple thyroid nodules    Benign  . Peptic ulcer disease   . Retinal artery occlusion    on left    PAST SURGICAL HISTORY: Past Surgical History:  Procedure Laterality Date  . ABDOMINAL HYSTERECTOMY    . ARTERY BIOPSY Left 11/19/2015   Procedure: BIOPSY TEMPORAL ARTERY;  Surgeon: Algernon Huxley, MD;  Location: [facility] ORS;  Service: Vascular;  Laterality: Left;  . BREAST BIOPSY Left 2002   core- neg  . CARDIAC CATHETERIZATION N/A 08/08/2015   Procedure: Right and Left Heart Cath and Coronary Angiography;  Surgeon: Teodoro Spray, MD;  Location: [place] CV LAB;  Service: Cardiovascular;  Laterality: N/A;  . CARDIAC CATHETERIZATION Bilateral 09/03/2016   Procedure: Right/Left Heart Cath and Coronary Angiography;  Surgeon: Teodoro Spray, MD;  Location: [place] CV LAB;  Service: Cardiovascular;  Laterality: Bilateral;  . CATARACT EXTRACTION W/ INTRAOCULAR LENS  IMPLANT, BILATERAL    . COLONOSCOPY     in 2013- internal hemorrhoids  . COLONOSCOPY WITH PROPOFOL N/A 07/07/2018   Procedure: COLONOSCOPY WITH PROPOFOL;  Surgeon: Manya Silvas, MD;  Location: [facility] ENDOSCOPY;  Service: Endoscopy;  Laterality: N/A;  . CORONARY ANGIOGRAPHY N/A 09/14/2017   Procedure: CORONARY ANGIOGRAPHY;  Surgeon: Teodoro Spray, MD;  Location: [place] CV LAB;  Service: Cardiovascular;  Laterality: N/A;  .  CORONARY ANGIOPLASTY    . CORONARY STENT PLACEMENT    . ENDARTERECTOMY Left 10/01/2015   Procedure: ENDARTERECTOMY CAROTID;  Surgeon: Algernon Huxley, MD;  Location: [facility] ORS;  Service: Vascular;  Laterality: Left;  . ESOPHAGOGASTRODUODENOSCOPY    . ESOPHAGOGASTRODUODENOSCOPY (EGD) WITH PROPOFOL N/A  05/31/2016   Procedure: ESOPHAGOGASTRODUODENOSCOPY (EGD) WITH PROPOFOL;  Surgeon: Manya Silvas, MD;  Location: [facility] ENDOSCOPY;  Service: Endoscopy;  Laterality: N/A;  . ESOPHAGOGASTRODUODENOSCOPY (EGD) WITH PROPOFOL N/A 07/07/2018   Procedure: ESOPHAGOGASTRODUODENOSCOPY (EGD) WITH PROPOFOL;  Surgeon: Manya Silvas, MD;  Location: [facility] ENDOSCOPY;  Service: Endoscopy;  Laterality: N/A;  . ESOPHAGOGASTRODUODENOSCOPY (EGD) WITH PROPOFOL N/A 07/17/2018   Procedure: ESOPHAGOGASTRODUODENOSCOPY (EGD) WITH PROPOFOL;  Surgeon: Lucilla Lame, MD;  Location: [facility] ENDOSCOPY;  Service: Endoscopy;  Laterality: N/A;  . EYE SURGERY    . GIVENS CAPSULE STUDY N/A 04/17/2013   Procedure: GIVENS CAPSULE STUDY;  Surgeon: Arta Silence, MD;  Location: [facility] ENDOSCOPY;  Service: Endoscopy;  Laterality: N/A;  patient ate breakfast at 7am   . HEMORRHOID BANDING  07/27/2016   Dr Viona Gilmore. Tamala Julian  . PARTIAL HYSTERECTOMY    . RIGHT AND LEFT HEART CATH Bilateral 09/14/2017   Procedure: RIGHT AND LEFT HEART CATH;  Surgeon: Teodoro Spray, MD;  Location: [place] CV LAB;  Service: Cardiovascular;  Laterality: Bilateral;  . TRIGGER FINGER RELEASE      FAMILY HISTORY Family History  Problem Relation Age of Onset  . Uterine cancer Mother   . Breast cancer Mother 21  . Seizures Father   . Stroke Father   . Diabetes Father   . COPD Father   . Colon cancer Neg Hx       ADVANCED DIRECTIVES:    HEALTH MAINTENANCE: Social History   Tobacco Use  . Smoking status: Former Smoker    Quit date: 02/15/1985    Years since quitting: 34.8  . Smokeless tobacco: Never Used  . Tobacco comment: quit about 31 years ago  Substance Use Topics  . Alcohol use: No    Alcohol/week: 0.0 standard drinks  . Drug use: No    Colonoscopy:  PAP:  Bone density: Osteoporosis- DEXA 06/2017 (on fosamax)  Lipid panel:  Allergies  Allergen Reactions  . Aspirin Other (See Comments)    ulcers  . Invokamet [Canagliflozin-Metformin Hcl] Other  (See Comments)    Yeast infection  . Sulfa Antibiotics Other (See Comments)    "Got Drunk"  . Lovastatin Rash  . Penicillins Rash    Has patient had a PCN reaction causing immediate rash, facial/tongue/throat swelling, SOB or lightheadedness with hypotension:Yes Has patient had a PCN reaction causing severe rash involving mucus membranes or skin necrosis:all over body Has patient had a PCN reaction that required hospitalization: No Has patient had a PCN reaction occurring within the last 10 years: No If all of the above answers are "NO", then may proceed with Cephalosporin use.   . Vicodin [Hydrocodone-Acetaminophen] Rash    Current Outpatient Medications  Medication Sig Dispense Refill  . acetaminophen (TYLENOL) 325 MG tablet Take 2 tablets (650 mg total) by mouth every 6 (six) hours as needed for mild pain (or Fever >/= 101). 30 tablet 0  . albuterol (PROAIR HFA) 108 (90 Base) MCG/ACT inhaler Inhale 2 puffs into the lungs every 4 (four) hours as needed for wheezing or shortness of breath.     Marland Kitchen alendronate (FOSAMAX) 70 MG tablet Take 70 mg by mouth every Tuesday.     Marland Kitchen amitriptyline (ELAVIL) 25  MG tablet Take by mouth.    . Emollient (CERAVE) CREA Apply 1 application topically daily as needed (for dry skin).     Marland Kitchen gabapentin (NEURONTIN) 300 MG capsule Take by mouth.    . insulin NPH-regular Human (NOVOLIN 70/30) (70-30) 100 UNIT/ML injection Inject 60-70 Units into the skin See admin instructions. 80 units every morning and 60 units every afternoon    . Insulin Syringe-Needle U-100 (INSULIN SYRINGE 1CC/30GX5/16") 30G X 5/16" 1 ML MISC USE ONE TWICE DAILY WITH INSULIN    . losartan (COZAAR) 25 MG tablet Take 25 mg by mouth daily.    . metoprolol tartrate (LOPRESSOR) 100 MG tablet Take by mouth.    . metoprolol tartrate (LOPRESSOR) 50 MG tablet Take 2 tablets (100 mg total) by mouth 2 (two) times daily. (Patient taking differently: Take 150 mg by mouth 2 (two) times daily. ) 60 tablet 0    . omeprazole (PRILOSEC) 40 MG capsule Take 40 mg by mouth 2 (two) times daily.    . pravastatin (PRAVACHOL) 10 MG tablet Take 1 tablet (10 mg total) by mouth daily at 6 PM. 30 tablet 0  . Semaglutide,0.25 or 0.5MG/DOS, 2 MG/1.5ML SOPN Take 0.25 mg for 4 weeks then increase to 0.5 mg weekly.    Marland Kitchen tiotropium (SPIRIVA HANDIHALER) 18 MCG inhalation capsule Place 18 mcg into inhaler and inhale daily.      No current facility-administered medications for this visit.   Facility-Administered Medications Ordered in Other Visits  Medication Dose Route Frequency Provider Last Rate Last Admin  . heparin lock flush 100 unit/mL  500 Units Intravenous Once Verlon Au, NP      . sodium chloride flush (NS) 0.9 % injection 10 mL  10 mL Intravenous Once Verlon Au, NP        OBJECTIVE: Vitals:   12/17/19 1313  BP: (!) 195/69  Pulse: (!) 59  Temp: 98.3 F (36.8 C)     Body mass index is 37.43 kg/m.    ECOG FS:0 - Asymptomatic  General: Well-developed, well-nourished, no acute distress. Eyes: Pink conjunctiva, anicteric sclera. HEENT: Normocephalic, moist mucous membranes. Lungs: No audible wheezing or coughing. Heart: Regular rate and rhythm. Abdomen: Soft, nontender, no obvious distention. Musculoskeletal: No edema, cyanosis, or clubbing. Neuro: Alert, answering all questions appropriately. Cranial nerves grossly intact. Skin: Small eschar noted on lower extremity with surrounding erythema. Psych: Normal affect.   LAB RESULTS:  Lab Results  Component Value Date   NA 134 (L) 07/26/2019   K 4.2 07/26/2019   CL 97 (L) 07/26/2019   CO2 27 07/26/2019   GLUCOSE 349 (H) 07/26/2019   BUN 19 07/26/2019   CREATININE 0.83 07/26/2019   CALCIUM 9.5 07/26/2019   PROT 7.9 07/26/2019   ALBUMIN 4.2 07/26/2019   AST 27 07/26/2019   ALT 29 07/26/2019   ALKPHOS 89 07/26/2019   BILITOT 0.8 07/26/2019   GFRNONAA >60 07/26/2019   GFRAA >60 07/26/2019    Lab Results  Component Value Date    WBC 5.2 12/17/2019   NEUTROABS 3.0 12/17/2019   HGB 11.0 (L) 12/17/2019   HCT 35.1 (L) 12/17/2019   MCV 89.3 12/17/2019   PLT 248 12/17/2019   Lab Results  Component Value Date   IRON 50 08/16/2019   TIBC 286 08/16/2019   IRONPCTSAT 18 08/16/2019    Lab Results  Component Value Date   FERRITIN 39 08/16/2019     STUDIES: No results found.  ASSESSMENT: Iron deficiency anemia, secondary to  chronic blood loss.  PLAN:    1. Iron deficiency anemia: Patient continues to have a mild anemia with a hemoglobin 11.0, but her iron stores continue to be within normal limits.  Nuclear medicine bleeding scan completed on 08/23/2018 did not reveal any significant GI bleed.  Bone marrow biopsy on 02/15/2017 did not reveal any significant pathology.  Her most recent colonoscopy and upper endoscopy occurred in July 2019 with a capsule endoscopy in August 2019.  No intervention is needed at this time.  Patient last received IV Feraheme on November 08, 2018.  For insurance purposes, patient requires additional IV iron she will receive Venofer.  Return to clinic in 4 months with repeat laboratory for further evaluation. 2.  Skin lesion: Patient was instructed to call dermatology if she feels the lesion is getting larger or worse.  I spent a total of 15 minutes face-to-face with the patient and reviewing chart data of which greater than 50% of the visit was spent in counseling and coordination of care as detailed above.   Patient expressed understanding and was in agreement with this plan. She also understands that She can call clinic at any time with any questions, concerns, or complaints.    Lloyd Huger, MD 12/17/19 2:42 PM

## 2019-12-14 ENCOUNTER — Other Ambulatory Visit: Payer: Self-pay | Admitting: Emergency Medicine

## 2019-12-14 ENCOUNTER — Other Ambulatory Visit: Payer: Self-pay

## 2019-12-14 NOTE — [place] Notes (Signed)
Patient pre screened for office appointment, no questions or concerns today. Patient reminded of upcoming appointment time and date. 

## 2019-12-17 ENCOUNTER — Other Ambulatory Visit: Payer: Self-pay

## 2019-12-17 ENCOUNTER — Inpatient Hospital Stay: Payer: Medicare HMO | Attending: Oncology | Admitting: Oncology

## 2019-12-17 ENCOUNTER — Inpatient Hospital Stay: Payer: Medicare HMO

## 2019-12-17 VITALS — BP 195/69 | HR 59 | Temp 98.3°F | Wt 211.3 lb

## 2019-12-17 DIAGNOSIS — D500 Iron deficiency anemia secondary to blood loss (chronic): Secondary | ICD-10-CM | POA: Insufficient documentation

## 2019-12-17 DIAGNOSIS — M810 Age-related osteoporosis without current pathological fracture: Secondary | ICD-10-CM | POA: Diagnosis not present

## 2019-12-17 DIAGNOSIS — D509 Iron deficiency anemia, unspecified: Secondary | ICD-10-CM

## 2019-12-17 DIAGNOSIS — L989 Disorder of the skin and subcutaneous tissue, unspecified: Secondary | ICD-10-CM | POA: Insufficient documentation

## 2019-12-17 LAB — CBC WITH DIFFERENTIAL/PLATELET
Abs Immature Granulocytes: 0.01 10*3/uL (ref 0.00–0.07)
Basophils Absolute: 0 10*3/uL (ref 0.0–0.1)
Basophils Relative: 1 %
Eosinophils Absolute: 0.3 10*3/uL (ref 0.0–0.5)
Eosinophils Relative: 6 %
HCT: 35.1 % — ABNORMAL LOW (ref 36.0–46.0)
Hemoglobin: 11 g/dL — ABNORMAL LOW (ref 12.0–15.0)
Immature Granulocytes: 0 %
Lymphocytes Relative: 26 %
Lymphs Abs: 1.3 10*3/uL (ref 0.7–4.0)
MCH: 28 pg (ref 26.0–34.0)
MCHC: 31.3 g/dL (ref 30.0–36.0)
MCV: 89.3 fL (ref 80.0–100.0)
Monocytes Absolute: 0.6 10*3/uL (ref 0.1–1.0)
Monocytes Relative: 11 %
Neutro Abs: 3 10*3/uL (ref 1.7–7.7)
Neutrophils Relative %: 56 %
Platelets: 248 10*3/uL (ref 150–400)
RBC: 3.93 MIL/uL (ref 3.87–5.11)
RDW: 12.9 % (ref 11.5–15.5)
WBC: 5.2 10*3/uL (ref 4.0–10.5)
nRBC: 0 % (ref 0.0–0.2)

## 2019-12-18 ENCOUNTER — Encounter: Payer: Self-pay | Admitting: Physical Therapy

## 2019-12-18 ENCOUNTER — Ambulatory Visit: Payer: Medicare HMO | Admitting: Physical Therapy

## 2019-12-18 DIAGNOSIS — R809 Proteinuria, unspecified: Secondary | ICD-10-CM | POA: Diagnosis not present

## 2019-12-18 DIAGNOSIS — R293 Abnormal posture: Secondary | ICD-10-CM | POA: Diagnosis not present

## 2019-12-18 DIAGNOSIS — E1129 Type 2 diabetes mellitus with other diabetic kidney complication: Secondary | ICD-10-CM | POA: Diagnosis not present

## 2019-12-18 DIAGNOSIS — E1169 Type 2 diabetes mellitus with other specified complication: Secondary | ICD-10-CM | POA: Diagnosis not present

## 2019-12-18 DIAGNOSIS — M810 Age-related osteoporosis without current pathological fracture: Secondary | ICD-10-CM | POA: Diagnosis not present

## 2019-12-18 DIAGNOSIS — M6281 Muscle weakness (generalized): Secondary | ICD-10-CM | POA: Diagnosis not present

## 2019-12-18 DIAGNOSIS — R102 Pelvic and perineal pain: Secondary | ICD-10-CM

## 2019-12-18 DIAGNOSIS — E669 Obesity, unspecified: Secondary | ICD-10-CM | POA: Diagnosis not present

## 2019-12-18 DIAGNOSIS — Z794 Long term (current) use of insulin: Secondary | ICD-10-CM | POA: Diagnosis not present

## 2019-12-18 DIAGNOSIS — E1159 Type 2 diabetes mellitus with other circulatory complications: Secondary | ICD-10-CM | POA: Diagnosis not present

## 2019-12-18 DIAGNOSIS — R278 Other lack of coordination: Secondary | ICD-10-CM

## 2019-12-18 DIAGNOSIS — E042 Nontoxic multinodular goiter: Secondary | ICD-10-CM | POA: Diagnosis not present

## 2019-12-18 NOTE — Therapy (Signed)
[facility] [address]. [place], [place], [address] Phone: [phone]   Fax:  [phone]  Physical Therapy Treatment  Patient Details  Name: Joyce Potter MRN: [id number] Date of Birth: June 09, 1946 Referring Provider (PT): Norberta Keens   Encounter Date: 12/18/2019  PT End of Session - 12/18/19 0909    Visit Number  3    Number of Visits  13    Date for PT Re-Evaluation  02/20/20    Authorization Type  IE 11/28/2019    PT Start Time  0900    PT Stop Time  0955    PT Time Calculation (min)  55 min    Activity Tolerance  Patient tolerated treatment well    Behavior During Therapy  [employer] for tasks assessed/performed       Past Medical History:  Diagnosis Date  . Anemia    iron deficiency  . Aortic stenosis   . Basal cell carcinoma   . CAD (coronary artery disease)    s/p Left circumflex stent in 2012  . Carotid stenosis   . Cataract   . High cholesterol   . HTN (hypertension)   . Hyperlipidemia   . IDDM (insulin dependent diabetes mellitus)   . Multiple thyroid nodules    Benign  . Peptic ulcer disease   . Retinal artery occlusion    on left    Past Surgical History:  Procedure Laterality Date  . ABDOMINAL HYSTERECTOMY    . ARTERY BIOPSY Left 11/19/2015   Procedure: BIOPSY TEMPORAL ARTERY;  Surgeon: Algernon Huxley, MD;  Location: [facility] ORS;  Service: Vascular;  Laterality: Left;  . BREAST BIOPSY Left 2002   core- neg  . CARDIAC CATHETERIZATION N/A 08/08/2015   Procedure: Right and Left Heart Cath and Coronary Angiography;  Surgeon: Teodoro Spray, MD;  Location: [facility];  Service: Cardiovascular;  Laterality: N/A;  . CARDIAC CATHETERIZATION Bilateral 09/03/2016   Procedure: Right/Left Heart Cath and Coronary Angiography;  Surgeon: Teodoro Spray, MD;  Location: [place] [facility];  Service: Cardiovascular;  Laterality: Bilateral;  . CATARACT EXTRACTION W/ INTRAOCULAR LENS  IMPLANT, BILATERAL    .  COLONOSCOPY     in 2013- internal hemorrhoids  . COLONOSCOPY WITH PROPOFOL N/A 07/07/2018   Procedure: COLONOSCOPY WITH PROPOFOL;  Surgeon: Manya Silvas, MD;  Location: [facility] ENDOSCOPY;  Service: Endoscopy;  Laterality: N/A;  . CORONARY ANGIOGRAPHY N/A 09/14/2017   Procedure: CORONARY ANGIOGRAPHY;  Surgeon: Teodoro Spray, MD;  Location: [facility];  Service: Cardiovascular;  Laterality: N/A;  . CORONARY ANGIOPLASTY    . CORONARY STENT PLACEMENT    . ENDARTERECTOMY Left 10/01/2015   Procedure: ENDARTERECTOMY CAROTID;  Surgeon: Algernon Huxley, MD;  Location: [facility] ORS;  Service: Vascular;  Laterality: Left;  . ESOPHAGOGASTRODUODENOSCOPY    . ESOPHAGOGASTRODUODENOSCOPY (EGD) WITH PROPOFOL N/A 05/31/2016   Procedure: ESOPHAGOGASTRODUODENOSCOPY (EGD) WITH PROPOFOL;  Surgeon: Manya Silvas, MD;  Location: [facility] ENDOSCOPY;  Service: Endoscopy;  Laterality: N/A;  . ESOPHAGOGASTRODUODENOSCOPY (EGD) WITH PROPOFOL N/A 07/07/2018   Procedure: ESOPHAGOGASTRODUODENOSCOPY (EGD) WITH PROPOFOL;  Surgeon: Manya Silvas, MD;  Location: [facility] ENDOSCOPY;  Service: Endoscopy;  Laterality: N/A;  . ESOPHAGOGASTRODUODENOSCOPY (EGD) WITH PROPOFOL N/A 07/17/2018   Procedure: ESOPHAGOGASTRODUODENOSCOPY (EGD) WITH PROPOFOL;  Surgeon: Lucilla Lame, MD;  Location: [facility] ENDOSCOPY;  Service: Endoscopy;  Laterality: N/A;  . EYE SURGERY    . GIVENS CAPSULE STUDY N/A 04/17/2013   Procedure: GIVENS CAPSULE STUDY;  Surgeon:  Arta Silence, MD;  Location: [facility] ENDOSCOPY;  Service: Endoscopy;  Laterality: N/A;  patient ate breakfast at 7am   . HEMORRHOID BANDING  07/27/2016   Dr Viona Gilmore. Tamala Julian  . PARTIAL HYSTERECTOMY    . RIGHT AND LEFT HEART CATH Bilateral 09/14/2017   Procedure: RIGHT AND LEFT HEART CATH;  Surgeon: Teodoro Spray, MD;  Location: [place] [facility];  Service: Cardiovascular;  Laterality: Bilateral;  . TRIGGER FINGER RELEASE      There were no vitals filed for this visit.  Subjective Assessment - 12/18/19  0905    Subjective  Patient reports that she has increased her fiber intake and is also taking prebiotics with her probiotics. She feels this is helping some. She also notes that she has gained 6 pounds in a month which she is not pleased about and has concern over increased fluid. She notes hx of being placed on diuretics to help manage fluids. She presents to clinic with lilting gait and unsteadiness, R>L pain 7/10.    Currently in Pain?  Yes    Pain Score  7     Pain Location  Knee    Pain Orientation  Right;Left    Pain Descriptors / Indicators  Aching;Dull      TREATMENT Neuromuscular Re-education Supine hooklying diaphragmatic breathing with VCs and TCs for downregulation of the nervous system and improved management of IAP Supine hooklying, PFM lengthening with inhalation. VCs and TCs to decrease compensatory patterns and encourage optimal relaxation of the PFM. Supine hooklying, PFM contractions (endurance 3 sec x3) with exhalation. VCs and TCs to decrease compensatory patterns and encourage activation of the PFM.  Patient educated throughout session on appropriate technique and form using multi-modal cueing, HEP and activity modification. Patient articulated understanding and returned demonstration.   ASSESSMENT Patient presents to clinic with excellent motivation to participate in therapy. Patient demonstrates deficits in PFM strength and coordination, scar mobility, pain, posture, gait, and balance. Patient able to achieve good diaphragmatic excursion and PFM level 1 strengthening during today's session and responded positively to active interventions. Patient will benefit from continued skilled therapeutic intervention to address remaining deficits in PFM strength and coordination, scar mobility, pain, posture, gait, and balance in order to increase function and improve overall QOL.    PT Short Term Goals - 04/13/18 [id number]      PT SHORT TERM GOAL #1   Title  Patient will be  independent in home exercise program to improve strength/mobility for better functional independence with ADLs.    Time  4    Period  Weeks    Status  New    Target Date  05/11/18      PT SHORT TERM GOAL #2   Title  Patient (> 39 years old) will complete five times sit to stand test in < 15 seconds indicating an increased LE strength and improved balance.    Time  4    Period  Weeks    Status  New    Target Date  05/11/18        PT Long Term Goals - 11/28/19 1302      PT LONG TERM GOAL #1   Title  Patient will report BMs classified as Type 3-Type 4 on the [facility] Stool Chart greater than 25% of the time to demonstrate improved motility and stool bulking in order to decrease fecal distress and improve overall QOL.    Baseline  IE: 0%    Time  12  Period  Weeks    Status  New    Target Date  02/20/20      PT LONG TERM GOAL #2   Title  Patient will report >1 weeks without episode of fecal incontinence/smearing for improved function and participation at home and in the community.    Baseline  IE: >2x/week    Time  12    Period  Weeks    Status  New    Target Date  02/20/20      PT LONG TERM GOAL #3   Title  Patient will report decreased bowel frequency from worst (looser stools Type 6/7) 21 x / week ( 3 x / day) to < 14 x/ week to demonstrate improved bowel and PFM function for decrease limitations of participation at home and in the community.    Baseline  IE: 21x/week    Time  12    Period  Weeks    Status  New    Target Date  02/20/20      PT LONG TERM GOAL #4   Title  Patient will indicate at least a 45 point difference on the PFDI-20 form to demonstrate clinically significant improvement of PFM for a return to PLOF at home and in the community.    Baseline  IE: (138.53/300; POPDI 8.3%, CRAD-8 46.9%, UDI 83.3%)    Time  12    Period  Weeks    Status  New    Target Date  02/20/20      PT LONG TERM GOAL #5   Title  Patient will indicate >/= a 3.56 point decrease or  a score below 30 on the FISI to demonstrate clinically significant improvement in fecal incontinence for improved participation and overall QOL.    Baseline  IE: 32/61    Time  12    Period  Weeks    Status  New    Target Date  02/20/20      Additional Long Term Goals   Additional Long Term Goals  Yes      PT LONG TERM GOAL #6   Title  Patient will indicate at least a 5 point decrease on the ICIQ-UI Short Form to demonstrate clinically significant improvement in incontinence symptoms for an improvement in overall QOL.    Baseline  IE: 20    Time  12    Period  Weeks    Status  New    Target Date  02/20/20            Plan - 12/18/19 0909    Clinical Impression Statement  Patient presents to clinic with excellent motivation to participate in therapy. Patient demonstrates deficits in PFM strength and coordination, scar mobility, pain, posture, gait, and balance. Patient able to achieve good diaphragmatic excursion and PFM level 1 strengthening during today's session and responded positively to active interventions. Patient will benefit from continued skilled therapeutic intervention to address remaining deficits in PFM strength and coordination, scar mobility, pain, posture, gait, and balance in order to increase function and improve overall QOL.    Personal Factors and Comorbidities  Comorbidity 3+;Past/Current Experience;Fitness;Age;Time since onset of injury/illness/exacerbation;Sex;Profession    Comorbidities  CAD, anemia, aortic stenosis, DM, HTN, cataract, hx of GI bleed, osteoporosis    Examination-Activity Limitations  Squat;Lift;Hygiene/Grooming;Stairs;Caring for Others;Continence;Transfers;Toileting;Stand    Examination-Participation Restrictions  Laundry;Shop;Cleaning;Yard Work;Meal Prep;Interpersonal Relationship    Stability/Clinical Decision Making  Evolving/Moderate complexity    Rehab Potential  Fair    PT Frequency  1x /  week    PT Duration  12 weeks    PT  Treatment/Interventions  ADLs/Self Care Home Management;Aquatic Therapy;Biofeedback;Cryotherapy;Electrical Stimulation;Moist Heat;Functional mobility Arts administrator;Therapeutic activities;Therapeutic exercise;Balance training;Neuromuscular re-education;Manual techniques;Dry needling;Passive range of motion;Scar mobilization;Taping;Patient/family education    PT Next Visit Plan  External PFM assessment, IAP basics    PT Home Exercise Plan  Bowel Retraining    Consulted and Agree with Plan of Care  Patient       Patient will benefit from skilled therapeutic intervention in order to improve the following deficits and impairments:  Abnormal gait, Improper body mechanics, Pain, Postural dysfunction, Decreased scar mobility, Decreased coordination, Decreased balance, Difficulty walking, Decreased range of motion, Decreased endurance, Decreased strength, Hypomobility  Visit Diagnosis: Muscle weakness (generalized)  Other lack of coordination  Abnormal posture  Pelvic pain     Problem List Patient Active Problem List   Diagnosis Date Noted  . Background diabetic retinopathy associated with type 2 diabetes mellitus ([place]) 06/06/2019  . Uncontrolled type 2 diabetes mellitus with hyperglycemia ([place]) 04/30/2019  . Osteoporosis, post-menopausal 04/30/2019  . Multiple rib fractures 10/11/2018  . GIB (gastrointestinal bleeding) 07/17/2018  . Melena   . Symptomatic anemia   . PVD (peripheral vascular disease) ([place]) 02/11/2017  . Iron deficiency anemia due to chronic blood loss 07/13/2016  . Multinodular goiter 01/25/2016  . Central retinal artery occlusion 10/01/2015  . Carotid stenosis 10/01/2015  . Stroke ([place]) 09/28/2015  . Aortic stenosis, moderate 11/29/2014  . PUD (peptic ulcer disease) 07/17/2014  . HLD (hyperlipidemia) 05/07/2013  . GI bleed from an occult source with recurrent chronic blood loss anemia 04/14/2013  . CAD (coronary artery disease) with demand  ischemia from anemia 04/14/2013  . IDDM (insulin dependent diabetes mellitus) 04/14/2013  . HTN (hypertension) 04/14/2013  . Chest pain 04/14/2013   Myles Gip PT, DPT [phone] 12/18/2019, 12:58 PM  [facility] [address] [place], [place], [address] Phone: [phone]   Fax:  [phone]  Name: ELLIEANN SAHL MRN: [id number] Date of Birth: 1946/12/24

## 2019-12-21 ENCOUNTER — Encounter: Payer: Self-pay | Admitting: Oncology

## 2019-12-24 ENCOUNTER — Encounter: Payer: Self-pay | Admitting: Oncology

## 2019-12-25 ENCOUNTER — Encounter: Payer: Medicare HMO | Admitting: Physical Therapy

## 2019-12-25 DIAGNOSIS — R05 Cough: Secondary | ICD-10-CM | POA: Diagnosis not present

## 2019-12-25 DIAGNOSIS — R0982 Postnasal drip: Secondary | ICD-10-CM | POA: Diagnosis not present

## 2019-12-25 DIAGNOSIS — D509 Iron deficiency anemia, unspecified: Secondary | ICD-10-CM | POA: Diagnosis not present

## 2019-12-25 DIAGNOSIS — R0981 Nasal congestion: Secondary | ICD-10-CM | POA: Diagnosis not present

## 2019-12-25 DIAGNOSIS — Z03818 Encounter for observation for suspected exposure to other biological agents ruled out: Secondary | ICD-10-CM | POA: Diagnosis not present

## 2019-12-26 ENCOUNTER — Other Ambulatory Visit: Payer: Self-pay | Admitting: Emergency Medicine

## 2019-12-26 ENCOUNTER — Telehealth: Payer: Self-pay | Admitting: *Deleted

## 2019-12-26 NOTE — Telephone Encounter (Signed)
Patient called reporting that she went to Urgent care yesterday and was tested for COVID, results not back for a few more days. She states that they checked her labs and that her Iron sat is 9 and her Iron level is in the 20's and she was told to contact our office to see what to do. She reports that she has no energy and feels weak. Please advise.

## 2019-12-26 NOTE — Telephone Encounter (Signed)
Patient called reporting she was just given her COVID results and she is negative. I will send message to scheduling to have her come in for her iron infusion. Please enter orders for this.

## 2019-12-26 NOTE — Telephone Encounter (Signed)
Patient informed that once she gets her results to call back and we will schedule her for Iron infusion. She is agreeable to this plan

## 2019-12-26 NOTE — Telephone Encounter (Signed)
She's still going to have to wait till she's not sick to come into the [facility]

## 2019-12-26 NOTE — Telephone Encounter (Signed)
We can do iv iron once she is confirmed COVID negative.

## 2020-01-09 ENCOUNTER — Other Ambulatory Visit: Payer: Self-pay

## 2020-01-09 DIAGNOSIS — Z789 Other specified health status: Secondary | ICD-10-CM | POA: Diagnosis not present

## 2020-01-09 DIAGNOSIS — Z0189 Encounter for other specified special examinations: Secondary | ICD-10-CM | POA: Diagnosis not present

## 2020-01-09 DIAGNOSIS — M542 Cervicalgia: Secondary | ICD-10-CM | POA: Diagnosis not present

## 2020-01-09 DIAGNOSIS — M1990 Unspecified osteoarthritis, unspecified site: Secondary | ICD-10-CM | POA: Diagnosis not present

## 2020-01-09 DIAGNOSIS — Z79899 Other long term (current) drug therapy: Secondary | ICD-10-CM | POA: Diagnosis not present

## 2020-01-09 DIAGNOSIS — M25512 Pain in left shoulder: Secondary | ICD-10-CM | POA: Diagnosis not present

## 2020-01-09 DIAGNOSIS — Z7689 Persons encountering health services in other specified circumstances: Secondary | ICD-10-CM | POA: Diagnosis not present

## 2020-01-10 ENCOUNTER — Inpatient Hospital Stay: Payer: Medicare HMO | Attending: Oncology

## 2020-01-10 ENCOUNTER — Other Ambulatory Visit: Payer: Self-pay

## 2020-01-10 VITALS — BP 140/63 | HR 63 | Temp 95.6°F | Resp 20

## 2020-01-10 DIAGNOSIS — D500 Iron deficiency anemia secondary to blood loss (chronic): Secondary | ICD-10-CM | POA: Diagnosis present

## 2020-01-10 DIAGNOSIS — K922 Gastrointestinal hemorrhage, unspecified: Secondary | ICD-10-CM | POA: Diagnosis not present

## 2020-01-10 MED ORDER — SODIUM CHLORIDE 0.9 % IV SOLN
Freq: Once | INTRAVENOUS | Status: AC
  Filled 2020-01-10: qty 250

## 2020-01-10 MED ORDER — SODIUM CHLORIDE 0.9 % IV SOLN: 200.0000 mg | Freq: Once | INTRAVENOUS | Status: DC

## 2020-01-10 MED ORDER — IRON SUCROSE 20 MG/ML IV SOLN
200.0000 mg | Freq: Once | INTRAVENOUS | Status: AC
  Administered 2020-01-10: 13:00:00 200 mg via INTRAVENOUS
  Filled 2020-01-10: qty 10

## 2020-01-15 DIAGNOSIS — R69 Illness, unspecified: Secondary | ICD-10-CM | POA: Diagnosis not present

## 2020-02-15 ENCOUNTER — Encounter (INDEPENDENT_AMBULATORY_CARE_PROVIDER_SITE_OTHER): Payer: Medicare HMO

## 2020-02-15 ENCOUNTER — Ambulatory Visit (INDEPENDENT_AMBULATORY_CARE_PROVIDER_SITE_OTHER): Payer: Self-pay | Admitting: Vascular Surgery

## 2020-02-20 ENCOUNTER — Ambulatory Visit (INDEPENDENT_AMBULATORY_CARE_PROVIDER_SITE_OTHER): Payer: Medicare HMO

## 2020-02-20 ENCOUNTER — Other Ambulatory Visit: Payer: Self-pay

## 2020-02-20 DIAGNOSIS — I6523 Occlusion and stenosis of bilateral carotid arteries: Secondary | ICD-10-CM | POA: Diagnosis not present

## 2020-02-29 ENCOUNTER — Encounter (INDEPENDENT_AMBULATORY_CARE_PROVIDER_SITE_OTHER): Payer: Self-pay | Admitting: Vascular Surgery

## 2020-03-14 DIAGNOSIS — R809 Proteinuria, unspecified: Secondary | ICD-10-CM | POA: Diagnosis not present

## 2020-03-14 DIAGNOSIS — Z794 Long term (current) use of insulin: Secondary | ICD-10-CM | POA: Diagnosis not present

## 2020-03-14 DIAGNOSIS — E1129 Type 2 diabetes mellitus with other diabetic kidney complication: Secondary | ICD-10-CM | POA: Diagnosis not present

## 2020-03-21 ENCOUNTER — Telehealth: Payer: Self-pay | Admitting: *Deleted

## 2020-03-21 DIAGNOSIS — R5382 Chronic fatigue, unspecified: Secondary | ICD-10-CM | POA: Diagnosis not present

## 2020-03-21 DIAGNOSIS — E1159 Type 2 diabetes mellitus with other circulatory complications: Secondary | ICD-10-CM | POA: Diagnosis not present

## 2020-03-21 DIAGNOSIS — E1129 Type 2 diabetes mellitus with other diabetic kidney complication: Secondary | ICD-10-CM | POA: Diagnosis not present

## 2020-03-21 DIAGNOSIS — E119 Type 2 diabetes mellitus without complications: Secondary | ICD-10-CM | POA: Insufficient documentation

## 2020-03-21 DIAGNOSIS — E669 Obesity, unspecified: Secondary | ICD-10-CM | POA: Diagnosis not present

## 2020-03-21 DIAGNOSIS — E1169 Type 2 diabetes mellitus with other specified complication: Secondary | ICD-10-CM | POA: Diagnosis not present

## 2020-03-21 DIAGNOSIS — I10 Essential (primary) hypertension: Secondary | ICD-10-CM | POA: Diagnosis not present

## 2020-03-21 DIAGNOSIS — R0683 Snoring: Secondary | ICD-10-CM | POA: Diagnosis not present

## 2020-03-21 DIAGNOSIS — E1165 Type 2 diabetes mellitus with hyperglycemia: Secondary | ICD-10-CM | POA: Diagnosis not present

## 2020-03-21 DIAGNOSIS — M810 Age-related osteoporosis without current pathological fracture: Secondary | ICD-10-CM | POA: Diagnosis not present

## 2020-03-21 DIAGNOSIS — E1142 Type 2 diabetes mellitus with diabetic polyneuropathy: Secondary | ICD-10-CM | POA: Diagnosis not present

## 2020-03-21 NOTE — Telephone Encounter (Signed)
We can do lab/md/IV iron next week if there are chairs.

## 2020-03-21 NOTE — Telephone Encounter (Signed)
Patient called stating that she feels her iron level has dropped and would like to come in for a lab check. Pleas advise

## 2020-03-21 NOTE — Telephone Encounter (Signed)
Pt scheduled for labs on 3/29 and Finn\Venofer on 3/30.

## 2020-03-22 NOTE — Progress Notes (Signed)
[place]  [phone]) [phone] [phone]) [phone]  ID: EMMILYNN MARUT OB: 1946/05/28  MR#: [id number]  DJS#:[id number]  Patient Care Team: Maryland Pink, MD as PCP - General (Family Medicine) Manya Silvas, MD (Inactive) (Gastroenterology) Lloyd Huger, MD as Consulting Physician (Hematology and Oncology)  CHIEF COMPLAINT: Iron deficiency anemia, secondary to chronic blood loss.  INTERVAL HISTORY: Patient returns to clinic today as an add-on with complaints of increasing weakness and fatigue, dyspnea on exertion, and wheezing. She has no neurologic complaints.  She has a good appetite and denies weight loss.  She denies any recent fevers or illnesses.  She denies chest pain, shortness of breath, cough or hemoptysis. She denies any nausea, vomiting, constipation, or diarrhea.  She has no melena or hematochezia.  She has no urinary complaints.  Patient offers no further specific complaints today.  REVIEW OF SYSTEMS:   Review of Systems  Constitutional: Positive for malaise/fatigue. Negative for diaphoresis, fever and weight loss.  Eyes: Negative.  Negative for blurred vision.  Respiratory: Positive for shortness of breath and wheezing. Negative for cough and hemoptysis.   Cardiovascular: Negative.  Negative for chest pain and leg swelling.  Gastrointestinal: Negative for abdominal pain, blood in stool and melena.  Genitourinary: Negative.  Negative for flank pain and hematuria.  Musculoskeletal: Negative.  Negative for falls and joint pain.  Skin: Negative.  Negative for rash.  Neurological: Positive for weakness. Negative for dizziness, focal weakness and headaches.  Psychiatric/Behavioral: Negative.  The patient is not nervous/anxious.    As per HPI. Otherwise, a complete review of systems is negative.  PAST MEDICAL HISTORY: Past Medical History:  Diagnosis Date  . Anemia    iron deficiency  . Aortic stenosis   . Basal cell carcinoma   .  CAD (coronary artery disease)    s/p Left circumflex stent in 2012  . Carotid stenosis   . Cataract   . High cholesterol   . HTN (hypertension)   . Hyperlipidemia   . IDDM (insulin dependent diabetes mellitus)   . Multiple thyroid nodules    Benign  . Peptic ulcer disease   . Retinal artery occlusion    on left    PAST SURGICAL HISTORY: Past Surgical History:  Procedure Laterality Date  . ABDOMINAL HYSTERECTOMY    . ARTERY BIOPSY Left 11/19/2015   Procedure: BIOPSY TEMPORAL ARTERY;  Surgeon: Algernon Huxley, MD;  Location: [facility] ORS;  Service: Vascular;  Laterality: Left;  . BREAST BIOPSY Left 2002   core- neg  . CARDIAC CATHETERIZATION N/A 08/08/2015   Procedure: Right and Left Heart Cath and Coronary Angiography;  Surgeon: Teodoro Spray, MD;  Location: [place] [facility];  Service: Cardiovascular;  Laterality: N/A;  . CARDIAC CATHETERIZATION Bilateral 09/03/2016   Procedure: Right/Left Heart Cath and Coronary Angiography;  Surgeon: Teodoro Spray, MD;  Location: [place] [facility];  Service: Cardiovascular;  Laterality: Bilateral;  . CATARACT EXTRACTION W/ INTRAOCULAR LENS  IMPLANT, BILATERAL    . COLONOSCOPY     in 2013- internal hemorrhoids  . COLONOSCOPY WITH PROPOFOL N/A 07/07/2018   Procedure: COLONOSCOPY WITH PROPOFOL;  Surgeon: Manya Silvas, MD;  Location: [facility] ENDOSCOPY;  Service: Endoscopy;  Laterality: N/A;  . CORONARY ANGIOGRAPHY N/A 09/14/2017   Procedure: CORONARY ANGIOGRAPHY;  Surgeon: Teodoro Spray, MD;  Location: [place] [facility];  Service: Cardiovascular;  Laterality: N/A;  . CORONARY ANGIOPLASTY    . CORONARY STENT PLACEMENT    . ENDARTERECTOMY Left 10/01/2015  Procedure: ENDARTERECTOMY CAROTID;  Surgeon: Algernon Huxley, MD;  Location: [facility] ORS;  Service: Vascular;  Laterality: Left;  . ESOPHAGOGASTRODUODENOSCOPY    . ESOPHAGOGASTRODUODENOSCOPY (EGD) WITH PROPOFOL N/A 05/31/2016   Procedure: ESOPHAGOGASTRODUODENOSCOPY (EGD) WITH PROPOFOL;  Surgeon: Manya Silvas, MD;  Location: [facility] ENDOSCOPY;  Service: Endoscopy;  Laterality: N/A;  . ESOPHAGOGASTRODUODENOSCOPY (EGD) WITH PROPOFOL N/A 07/07/2018   Procedure: ESOPHAGOGASTRODUODENOSCOPY (EGD) WITH PROPOFOL;  Surgeon: Manya Silvas, MD;  Location: [facility] ENDOSCOPY;  Service: Endoscopy;  Laterality: N/A;  . ESOPHAGOGASTRODUODENOSCOPY (EGD) WITH PROPOFOL N/A 07/17/2018   Procedure: ESOPHAGOGASTRODUODENOSCOPY (EGD) WITH PROPOFOL;  Surgeon: Lucilla Lame, MD;  Location: [facility] ENDOSCOPY;  Service: Endoscopy;  Laterality: N/A;  . EYE SURGERY    . GIVENS CAPSULE STUDY N/A 04/17/2013   Procedure: GIVENS CAPSULE STUDY;  Surgeon: Arta Silence, MD;  Location: [facility] ENDOSCOPY;  Service: Endoscopy;  Laterality: N/A;  patient ate breakfast at 7am   . HEMORRHOID BANDING  07/27/2016   Dr Viona Gilmore. Tamala Julian  . PARTIAL HYSTERECTOMY    . RIGHT AND LEFT HEART CATH Bilateral 09/14/2017   Procedure: RIGHT AND LEFT HEART CATH;  Surgeon: Teodoro Spray, MD;  Location: [place] [facility];  Service: Cardiovascular;  Laterality: Bilateral;  . TRIGGER FINGER RELEASE      FAMILY HISTORY Family History  Problem Relation Age of Onset  . Uterine cancer Mother   . Breast cancer Mother 53  . Seizures Father   . Stroke Father   . Diabetes Father   . COPD Father   . Colon cancer Neg Hx       ADVANCED DIRECTIVES:    HEALTH MAINTENANCE: Social History   Tobacco Use  . Smoking status: Former Smoker    Quit date: 02/15/1985    Years since quitting: 35.1  . Smokeless tobacco: Never Used  . Tobacco comment: quit about 31 years ago  Substance Use Topics  . Alcohol use: No    Alcohol/week: 0.0 standard drinks  . Drug use: No    Colonoscopy:  PAP:  Bone density: Osteoporosis- DEXA 06/2017 (on fosamax)  Lipid panel:  Allergies  Allergen Reactions  . Aspirin Other (See Comments)    ulcers  . Invokamet [Canagliflozin-Metformin Hcl] Other (See Comments)    Yeast infection  . Sulfa Antibiotics Other (See Comments)    "Got  Drunk"  . Lovastatin Rash  . Penicillins Rash    Has patient had a PCN reaction causing immediate rash, facial/tongue/throat swelling, SOB or lightheadedness with hypotension:Yes Has patient had a PCN reaction causing severe rash involving mucus membranes or skin necrosis:all over body Has patient had a PCN reaction that required hospitalization: No Has patient had a PCN reaction occurring within the last 10 years: No If all of the above answers are "NO", then may proceed with Cephalosporin use.   . Vicodin [Hydrocodone-Acetaminophen] Rash    Current Outpatient Medications  Medication Sig Dispense Refill  . acetaminophen (TYLENOL) 325 MG tablet Take 2 tablets (650 mg total) by mouth every 6 (six) hours as needed for mild pain (or Fever >/= 101). 30 tablet 0  . albuterol (PROAIR HFA) 108 (90 Base) MCG/ACT inhaler Inhale 2 puffs into the lungs every 4 (four) hours as needed for wheezing or shortness of breath.     Marland Kitchen alendronate (FOSAMAX) 70 MG tablet Take 70 mg by mouth every Tuesday.     Marland Kitchen amitriptyline (ELAVIL) 25 MG tablet Take by mouth at bedtime as needed.     . fluticasone (FLONASE) 50 MCG/ACT  nasal spray Place 1 spray into both nostrils 2 (two) times daily.    Marland Kitchen gabapentin (NEURONTIN) 300 MG capsule Take 600 mg by mouth daily.     . insulin NPH-regular Human (NOVOLIN 70/30) (70-30) 100 UNIT/ML injection Inject 60-70 Units into the skin See admin instructions. 80 units every morning and 60 units every afternoon    . Insulin Syringe-Needle U-100 (INSULIN SYRINGE 1CC/30GX5/16") 30G X 5/16" 1 ML MISC USE ONE TWICE DAILY WITH INSULIN    . lisinopril (ZESTRIL) 10 MG tablet Take 10 mg by mouth daily.    . metoprolol tartrate (LOPRESSOR) 100 MG tablet Take 100 mg by mouth 2 (two) times daily.     . metoprolol tartrate (LOPRESSOR) 50 MG tablet Take 2 tablets (100 mg total) by mouth 2 (two) times daily. (Patient taking differently: Take 150 mg by mouth 2 (two) times daily. ) 60 tablet 0  .  omeprazole (PRILOSEC) 40 MG capsule Take 40 mg by mouth 2 (two) times daily.    . pravastatin (PRAVACHOL) 10 MG tablet Take 1 tablet (10 mg total) by mouth daily at 6 PM. 30 tablet 0  . Semaglutide,0.25 or 0.5MG/DOS, 2 MG/1.5ML SOPN Take 0.25 mg for 4 weeks then increase to 0.5 mg weekly.    Marland Kitchen tiotropium (SPIRIVA HANDIHALER) 18 MCG inhalation capsule Place 18 mcg into inhaler and inhale daily.     . TRULICITY 1.5 YK/5.9DJ SOPN Inject 0.5 mLs into the skin once a week.     No current facility-administered medications for this visit.   Facility-Administered Medications Ordered in Other Visits  Medication Dose Route Frequency Provider Last Rate Last Admin  . heparin lock flush 100 unit/mL  500 Units Intravenous Once Verlon Au, NP      . sodium chloride flush (NS) 0.9 % injection 10 mL  10 mL Intravenous Once Verlon Au, NP        OBJECTIVE: Vitals:   03/25/20 1345  BP: (!) 173/60  Pulse: 62  Resp: 18  Temp: 98.7 F (37.1 C)  SpO2: 100%     Body mass index is 36.86 kg/m.    ECOG FS:0 - Asymptomatic  General: Well-developed, well-nourished, no acute distress. Eyes: Pink conjunctiva, anicteric sclera. HEENT: Normocephalic, moist mucous membranes. Lungs: No audible wheezing or coughing. Heart: Regular rate and rhythm. Abdomen: Soft, nontender, no obvious distention. Musculoskeletal: No edema, cyanosis, or clubbing. Neuro: Alert, answering all questions appropriately. Cranial nerves grossly intact. Skin: No rashes or petechiae noted. Psych: Normal affect.   LAB RESULTS:  Lab Results  Component Value Date   NA 134 (L) 07/26/2019   K 4.2 07/26/2019   CL 97 (L) 07/26/2019   CO2 27 07/26/2019   GLUCOSE 349 (H) 07/26/2019   BUN 19 07/26/2019   CREATININE 0.83 07/26/2019   CALCIUM 9.5 07/26/2019   PROT 7.9 07/26/2019   ALBUMIN 4.2 07/26/2019   AST 27 07/26/2019   ALT 29 07/26/2019   ALKPHOS 89 07/26/2019   BILITOT 0.8 07/26/2019   GFRNONAA >60 07/26/2019   GFRAA  >60 07/26/2019    Lab Results  Component Value Date   WBC 5.0 03/24/2020   NEUTROABS 3.2 03/24/2020   HGB 11.7 (L) 03/24/2020   HCT 37.0 03/24/2020   MCV 85.3 03/24/2020   PLT 302 03/24/2020   Lab Results  Component Value Date   IRON 60 03/24/2020   TIBC 294 03/24/2020   IRONPCTSAT 20 03/24/2020    Lab Results  Component Value Date   FERRITIN 31  03/24/2020     STUDIES: No results found.  ASSESSMENT: Iron deficiency anemia, secondary to chronic blood loss.  PLAN:    1. Iron deficiency anemia: Patient's hemoglobin continues to be mildly decreased, but significantly improved from previous to 11.7.  Iron stores continue to be within normal limits.  Previously,nuclear medicine bleeding scan completed on 08/23/2018 did not reveal any significant GI bleed.  Bone marrow biopsy on 02/15/2017 did not reveal any significant pathology.  Her most recent colonoscopy and upper endoscopy occurred in July 2019 with a capsule endoscopy in August 2019.  No intervention is needed at this time.  Patient last received IV Feraheme on November 08, 2018.  For insurance purposes, if patient requires additional IV iron she will receive Venofer.  Return to clinic in 6 months with repeat laboratory work and further evaluation. 2.  Weakness and fatigue: Unrelated to blood work.  Given her increased dyspnea on exertion and wheezing, this may be cardiac in nature and patient has been instructed to call her primary cardiologist this afternoon. 3.  Dyspnea exertion/wheezing: Possibly cardiac in nature.  Patient expressed understanding and was in agreement with this plan. She also understands that She can call clinic at any time with any questions, concerns, or complaints.    Lloyd Huger, MD 03/25/20 3:00 PM

## 2020-03-24 ENCOUNTER — Inpatient Hospital Stay: Payer: Medicare HMO

## 2020-03-24 DIAGNOSIS — J449 Chronic obstructive pulmonary disease, unspecified: Secondary | ICD-10-CM | POA: Insufficient documentation

## 2020-03-24 DIAGNOSIS — D509 Iron deficiency anemia, unspecified: Secondary | ICD-10-CM | POA: Diagnosis present

## 2020-03-24 DIAGNOSIS — M810 Age-related osteoporosis without current pathological fracture: Secondary | ICD-10-CM | POA: Insufficient documentation

## 2020-03-24 DIAGNOSIS — Z87891 Personal history of nicotine dependence: Secondary | ICD-10-CM | POA: Insufficient documentation

## 2020-03-24 DIAGNOSIS — R531 Weakness: Secondary | ICD-10-CM | POA: Diagnosis not present

## 2020-03-24 DIAGNOSIS — R0609 Other forms of dyspnea: Secondary | ICD-10-CM | POA: Diagnosis not present

## 2020-03-24 DIAGNOSIS — R5383 Other fatigue: Secondary | ICD-10-CM | POA: Diagnosis not present

## 2020-03-24 DIAGNOSIS — R062 Wheezing: Secondary | ICD-10-CM | POA: Diagnosis not present

## 2020-03-24 LAB — CBC WITH DIFFERENTIAL/PLATELET
Abs Immature Granulocytes: 0.01 10*3/uL (ref 0.00–0.07)
Basophils Absolute: 0 10*3/uL (ref 0.0–0.1)
Basophils Relative: 1 %
Eosinophils Absolute: 0.1 10*3/uL (ref 0.0–0.5)
Eosinophils Relative: 1 %
HCT: 37 % (ref 36.0–46.0)
Hemoglobin: 11.7 g/dL — ABNORMAL LOW (ref 12.0–15.0)
Immature Granulocytes: 0 %
Lymphocytes Relative: 24 %
Lymphs Abs: 1.2 10*3/uL (ref 0.7–4.0)
MCH: 27 pg (ref 26.0–34.0)
MCHC: 31.6 g/dL (ref 30.0–36.0)
MCV: 85.3 fL (ref 80.0–100.0)
Monocytes Absolute: 0.6 10*3/uL (ref 0.1–1.0)
Monocytes Relative: 11 %
Neutro Abs: 3.2 10*3/uL (ref 1.7–7.7)
Neutrophils Relative %: 63 %
Platelets: 302 10*3/uL (ref 150–400)
RBC: 4.34 MIL/uL (ref 3.87–5.11)
RDW: 13.2 % (ref 11.5–15.5)
WBC: 5 10*3/uL (ref 4.0–10.5)
nRBC: 0 % (ref 0.0–0.2)

## 2020-03-24 LAB — IRON AND TIBC
Iron: 60 ug/dL (ref 28–170)
Saturation Ratios: 20 % (ref 10.4–31.8)
TIBC: 294 ug/dL (ref 250–450)
UIBC: 234 ug/dL

## 2020-03-24 LAB — FERRITIN: Ferritin: 31 ng/mL (ref 11–307)

## 2020-03-25 ENCOUNTER — Inpatient Hospital Stay: Payer: Medicare HMO

## 2020-03-25 ENCOUNTER — Inpatient Hospital Stay: Payer: Medicare HMO | Attending: Oncology | Admitting: Oncology

## 2020-03-25 ENCOUNTER — Encounter: Payer: Self-pay | Admitting: Oncology

## 2020-03-25 ENCOUNTER — Other Ambulatory Visit: Payer: Self-pay

## 2020-03-25 VITALS — BP 173/60 | HR 62 | Temp 98.7°F | Resp 18 | Wt 208.1 lb

## 2020-03-25 DIAGNOSIS — M810 Age-related osteoporosis without current pathological fracture: Secondary | ICD-10-CM | POA: Diagnosis not present

## 2020-03-25 DIAGNOSIS — R5383 Other fatigue: Secondary | ICD-10-CM | POA: Diagnosis not present

## 2020-03-25 DIAGNOSIS — R062 Wheezing: Secondary | ICD-10-CM | POA: Diagnosis not present

## 2020-03-25 DIAGNOSIS — R531 Weakness: Secondary | ICD-10-CM | POA: Diagnosis not present

## 2020-03-25 DIAGNOSIS — Z87891 Personal history of nicotine dependence: Secondary | ICD-10-CM | POA: Diagnosis not present

## 2020-03-25 DIAGNOSIS — D509 Iron deficiency anemia, unspecified: Secondary | ICD-10-CM | POA: Diagnosis not present

## 2020-03-25 DIAGNOSIS — R0609 Other forms of dyspnea: Secondary | ICD-10-CM | POA: Diagnosis not present

## 2020-03-25 DIAGNOSIS — D500 Iron deficiency anemia secondary to blood loss (chronic): Secondary | ICD-10-CM | POA: Diagnosis not present

## 2020-03-25 DIAGNOSIS — J449 Chronic obstructive pulmonary disease, unspecified: Secondary | ICD-10-CM | POA: Diagnosis not present

## 2020-03-25 NOTE — Progress Notes (Signed)
Patient states she feels her lungs are getting worse. She was recently told she has COPD and has been wheezing more. Denies pain or other concerns.

## 2020-04-01 DIAGNOSIS — R0602 Shortness of breath: Secondary | ICD-10-CM | POA: Diagnosis not present

## 2020-04-01 DIAGNOSIS — I10 Essential (primary) hypertension: Secondary | ICD-10-CM | POA: Diagnosis not present

## 2020-04-01 DIAGNOSIS — R000 Tachycardia, unspecified: Secondary | ICD-10-CM | POA: Diagnosis not present

## 2020-04-01 DIAGNOSIS — I2510 Atherosclerotic heart disease of native coronary artery without angina pectoris: Secondary | ICD-10-CM | POA: Diagnosis not present

## 2020-04-01 DIAGNOSIS — I739 Peripheral vascular disease, unspecified: Secondary | ICD-10-CM | POA: Diagnosis not present

## 2020-04-01 DIAGNOSIS — I350 Nonrheumatic aortic (valve) stenosis: Secondary | ICD-10-CM | POA: Diagnosis not present

## 2020-04-07 DIAGNOSIS — R0600 Dyspnea, unspecified: Secondary | ICD-10-CM | POA: Diagnosis not present

## 2020-04-07 DIAGNOSIS — Z01818 Encounter for other preprocedural examination: Secondary | ICD-10-CM | POA: Diagnosis not present

## 2020-04-15 DIAGNOSIS — R002 Palpitations: Secondary | ICD-10-CM | POA: Diagnosis not present

## 2020-04-16 ENCOUNTER — Other Ambulatory Visit: Payer: Medicare HMO

## 2020-04-18 ENCOUNTER — Ambulatory Visit: Payer: Medicare HMO

## 2020-04-18 ENCOUNTER — Ambulatory Visit: Payer: Medicare HMO | Admitting: Oncology

## 2020-04-22 ENCOUNTER — Other Ambulatory Visit: Payer: Self-pay

## 2020-04-23 DIAGNOSIS — R69 Illness, unspecified: Secondary | ICD-10-CM | POA: Diagnosis not present

## 2020-04-26 DIAGNOSIS — G4733 Obstructive sleep apnea (adult) (pediatric): Secondary | ICD-10-CM | POA: Diagnosis not present

## 2020-04-29 ENCOUNTER — Other Ambulatory Visit: Payer: Self-pay | Admitting: Internal Medicine

## 2020-04-29 DIAGNOSIS — I10 Essential (primary) hypertension: Secondary | ICD-10-CM | POA: Diagnosis not present

## 2020-04-29 DIAGNOSIS — E118 Type 2 diabetes mellitus with unspecified complications: Secondary | ICD-10-CM | POA: Diagnosis not present

## 2020-04-29 DIAGNOSIS — I2510 Atherosclerotic heart disease of native coronary artery without angina pectoris: Secondary | ICD-10-CM | POA: Diagnosis not present

## 2020-04-29 DIAGNOSIS — M810 Age-related osteoporosis without current pathological fracture: Secondary | ICD-10-CM | POA: Diagnosis not present

## 2020-04-29 DIAGNOSIS — R69 Illness, unspecified: Secondary | ICD-10-CM | POA: Diagnosis not present

## 2020-04-29 DIAGNOSIS — E782 Mixed hyperlipidemia: Secondary | ICD-10-CM | POA: Diagnosis not present

## 2020-04-29 DIAGNOSIS — R5383 Other fatigue: Secondary | ICD-10-CM | POA: Diagnosis not present

## 2020-04-29 DIAGNOSIS — D519 Vitamin B12 deficiency anemia, unspecified: Secondary | ICD-10-CM | POA: Diagnosis not present

## 2020-04-29 DIAGNOSIS — Z1231 Encounter for screening mammogram for malignant neoplasm of breast: Secondary | ICD-10-CM

## 2020-04-29 DIAGNOSIS — I351 Nonrheumatic aortic (valve) insufficiency: Secondary | ICD-10-CM | POA: Diagnosis not present

## 2020-05-02 DIAGNOSIS — I2510 Atherosclerotic heart disease of native coronary artery without angina pectoris: Secondary | ICD-10-CM | POA: Diagnosis not present

## 2020-05-02 DIAGNOSIS — I350 Nonrheumatic aortic (valve) stenosis: Secondary | ICD-10-CM | POA: Diagnosis not present

## 2020-05-05 ENCOUNTER — Other Ambulatory Visit: Payer: Self-pay | Admitting: *Deleted

## 2020-05-05 NOTE — Patient Outreach (Signed)
[place] [facility]) Care Management  05/05/2020  CARRIE PROTSMAN 25-Sep-1946 [id number]  RN Health Coach attempted follow up outreach call to patient.  Patient was unavailable. HIPPA compliance voicemail message left with return callback number.  Plan: RN will call patient again within 30 days.  [place] Care Management [phone]

## 2020-05-06 DIAGNOSIS — R0602 Shortness of breath: Secondary | ICD-10-CM | POA: Diagnosis not present

## 2020-05-06 DIAGNOSIS — I350 Nonrheumatic aortic (valve) stenosis: Secondary | ICD-10-CM | POA: Diagnosis not present

## 2020-05-06 DIAGNOSIS — E7800 Pure hypercholesterolemia, unspecified: Secondary | ICD-10-CM | POA: Diagnosis not present

## 2020-05-06 DIAGNOSIS — E1169 Type 2 diabetes mellitus with other specified complication: Secondary | ICD-10-CM | POA: Diagnosis not present

## 2020-05-06 DIAGNOSIS — E669 Obesity, unspecified: Secondary | ICD-10-CM | POA: Diagnosis not present

## 2020-05-06 DIAGNOSIS — I6523 Occlusion and stenosis of bilateral carotid arteries: Secondary | ICD-10-CM | POA: Diagnosis not present

## 2020-05-06 DIAGNOSIS — I10 Essential (primary) hypertension: Secondary | ICD-10-CM | POA: Diagnosis not present

## 2020-05-06 DIAGNOSIS — E1159 Type 2 diabetes mellitus with other circulatory complications: Secondary | ICD-10-CM | POA: Diagnosis not present

## 2020-05-06 DIAGNOSIS — I2510 Atherosclerotic heart disease of native coronary artery without angina pectoris: Secondary | ICD-10-CM | POA: Diagnosis not present

## 2020-05-06 DIAGNOSIS — I739 Peripheral vascular disease, unspecified: Secondary | ICD-10-CM | POA: Diagnosis not present

## 2020-05-08 ENCOUNTER — Ambulatory Visit
Admission: RE | Admit: 2020-05-08 | Discharge: 2020-05-08 | Disposition: A | Discharge status: Home / Self Care | Payer: Medicare HMO | Source: Ambulatory Visit | Attending: Internal Medicine | Admitting: Internal Medicine

## 2020-05-08 DIAGNOSIS — Z1231 Encounter for screening mammogram for malignant neoplasm of breast: Secondary | ICD-10-CM | POA: Insufficient documentation

## 2020-05-13 ENCOUNTER — Other Ambulatory Visit
Admission: RE | Admit: 2020-05-13 | Discharge: 2020-05-13 | Disposition: A | Discharge status: Home / Self Care | Payer: Medicare HMO | Source: Ambulatory Visit | Attending: Cardiology | Admitting: Cardiology

## 2020-05-13 DIAGNOSIS — Z20822 Contact with and (suspected) exposure to covid-19: Secondary | ICD-10-CM | POA: Insufficient documentation

## 2020-05-13 DIAGNOSIS — G4733 Obstructive sleep apnea (adult) (pediatric): Secondary | ICD-10-CM | POA: Diagnosis not present

## 2020-05-13 DIAGNOSIS — Z01812 Encounter for preprocedural laboratory examination: Secondary | ICD-10-CM | POA: Diagnosis not present

## 2020-05-13 LAB — SARS CORONAVIRUS 2 (TAT 6-24 HRS): SARS Coronavirus 2: NEGATIVE

## 2020-05-14 ENCOUNTER — Other Ambulatory Visit: Payer: Self-pay | Admitting: *Deleted

## 2020-05-14 NOTE — Patient Outreach (Signed)
[place] [facility]) Care Management  05/14/2020  Joyce Potter 1946/07/15 [id number]   RN Health Coach attempted follow up outreach call to patient.  Patient was unavailable. HIPPA compliance voicemail message left with return callback number.  Plan: RN will call patient again within 30 days.  [place] Care Management [phone]

## 2020-05-15 ENCOUNTER — Other Ambulatory Visit: Payer: Self-pay

## 2020-05-15 ENCOUNTER — Encounter
Admission: RE | Disposition: A | Discharge status: Home / Self Care | Payer: Self-pay | Source: Home / Self Care | Attending: Cardiology

## 2020-05-15 ENCOUNTER — Encounter: Payer: Self-pay | Admitting: Cardiology

## 2020-05-15 ENCOUNTER — Observation Stay
Admission: RE | Admit: 2020-05-15 | Discharge: 2020-05-16 | Disposition: A | Discharge status: Home / Self Care | Payer: Medicare HMO | Attending: Cardiology | Admitting: Cardiology

## 2020-05-15 DIAGNOSIS — Z955 Presence of coronary angioplasty implant and graft: Secondary | ICD-10-CM | POA: Insufficient documentation

## 2020-05-15 DIAGNOSIS — R002 Palpitations: Secondary | ICD-10-CM | POA: Diagnosis not present

## 2020-05-15 DIAGNOSIS — E7800 Pure hypercholesterolemia, unspecified: Secondary | ICD-10-CM | POA: Insufficient documentation

## 2020-05-15 DIAGNOSIS — Z880 Allergy status to penicillin: Secondary | ICD-10-CM | POA: Diagnosis not present

## 2020-05-15 DIAGNOSIS — Z79899 Other long term (current) drug therapy: Secondary | ICD-10-CM | POA: Insufficient documentation

## 2020-05-15 DIAGNOSIS — I2510 Atherosclerotic heart disease of native coronary artery without angina pectoris: Secondary | ICD-10-CM | POA: Diagnosis not present

## 2020-05-15 DIAGNOSIS — Z87891 Personal history of nicotine dependence: Secondary | ICD-10-CM | POA: Diagnosis not present

## 2020-05-15 DIAGNOSIS — I10 Essential (primary) hypertension: Secondary | ICD-10-CM | POA: Insufficient documentation

## 2020-05-15 DIAGNOSIS — Z8262 Family history of osteoporosis: Secondary | ICD-10-CM | POA: Diagnosis not present

## 2020-05-15 DIAGNOSIS — E785 Hyperlipidemia, unspecified: Secondary | ICD-10-CM | POA: Diagnosis not present

## 2020-05-15 DIAGNOSIS — Z8249 Family history of ischemic heart disease and other diseases of the circulatory system: Secondary | ICD-10-CM | POA: Diagnosis not present

## 2020-05-15 DIAGNOSIS — Z794 Long term (current) use of insulin: Secondary | ICD-10-CM | POA: Insufficient documentation

## 2020-05-15 DIAGNOSIS — Z833 Family history of diabetes mellitus: Secondary | ICD-10-CM | POA: Insufficient documentation

## 2020-05-15 DIAGNOSIS — Z85828 Personal history of other malignant neoplasm of skin: Secondary | ICD-10-CM | POA: Insufficient documentation

## 2020-05-15 DIAGNOSIS — M810 Age-related osteoporosis without current pathological fracture: Secondary | ICD-10-CM | POA: Diagnosis not present

## 2020-05-15 DIAGNOSIS — Z886 Allergy status to analgesic agent status: Secondary | ICD-10-CM | POA: Insufficient documentation

## 2020-05-15 DIAGNOSIS — Z8711 Personal history of peptic ulcer disease: Secondary | ICD-10-CM | POA: Insufficient documentation

## 2020-05-15 DIAGNOSIS — D509 Iron deficiency anemia, unspecified: Secondary | ICD-10-CM | POA: Insufficient documentation

## 2020-05-15 DIAGNOSIS — E1159 Type 2 diabetes mellitus with other circulatory complications: Secondary | ICD-10-CM | POA: Diagnosis not present

## 2020-05-15 DIAGNOSIS — I350 Nonrheumatic aortic (valve) stenosis: Secondary | ICD-10-CM | POA: Diagnosis not present

## 2020-05-15 DIAGNOSIS — Z885 Allergy status to narcotic agent status: Secondary | ICD-10-CM | POA: Diagnosis not present

## 2020-05-15 HISTORY — PX: RIGHT/LEFT HEART CATH AND CORONARY ANGIOGRAPHY: CATH118266

## 2020-05-15 HISTORY — PX: CORONARY STENT INTERVENTION: CATH118234

## 2020-05-15 LAB — POCT ACTIVATED CLOTTING TIME: Activated Clotting Time: 373 seconds

## 2020-05-15 LAB — GLUCOSE, CAPILLARY: Glucose-Capillary: 117 mg/dL — ABNORMAL HIGH (ref 70–99)

## 2020-05-15 SURGERY — RIGHT/LEFT HEART CATH AND CORONARY ANGIOGRAPHY
Anesthesia: Moderate Sedation | Laterality: Right

## 2020-05-15 MED ORDER — SODIUM CHLORIDE 0.9 % WEIGHT BASED INFUSION: 1.0000 mL/kg/h | INTRAVENOUS | Status: DC

## 2020-05-15 MED ORDER — NITROGLYCERIN 1 MG/10 ML FOR IR/CATH LAB
INTRA_ARTERIAL | Status: AC
  Filled 2020-05-15: qty 10

## 2020-05-15 MED ORDER — FENTANYL CITRATE (PF) 100 MCG/2ML IJ SOLN
INTRAMUSCULAR | Status: AC
  Filled 2020-05-15: qty 2

## 2020-05-15 MED ORDER — NITROGLYCERIN 1 MG/10 ML FOR IR/CATH LAB
INTRA_ARTERIAL | Status: DC | PRN
  Administered 2020-05-15 (×2): 200 ug via INTRACORONARY

## 2020-05-15 MED ORDER — SODIUM CHLORIDE 0.9 % WEIGHT BASED INFUSION: 1.0000 mL/kg/h | INTRAVENOUS | Status: AC

## 2020-05-15 MED ORDER — INSULIN ASPART PROT & ASPART (70-30 MIX) 100 UNIT/ML ~~LOC~~ SUSP
80.0000 [IU] | Freq: Every day | SUBCUTANEOUS | Status: DC
  Administered 2020-05-16: 80 [IU] via SUBCUTANEOUS
  Filled 2020-05-15: qty 10

## 2020-05-15 MED ORDER — FENTANYL CITRATE (PF) 100 MCG/2ML IJ SOLN
INTRAMUSCULAR | Status: DC | PRN
  Administered 2020-05-15: 25 ug via INTRAVENOUS

## 2020-05-15 MED ORDER — SODIUM CHLORIDE 0.9 % IV SOLN
INTRAVENOUS | Status: AC | PRN
  Administered 2020-05-15: 1.75 mg/kg/h via INTRAVENOUS

## 2020-05-15 MED ORDER — SODIUM CHLORIDE 0.9% FLUSH
3.0000 mL | Freq: Two times a day (BID) | INTRAVENOUS | Status: DC
  Administered 2020-05-15: 3 mL via INTRAVENOUS

## 2020-05-15 MED ORDER — SODIUM CHLORIDE 0.9% FLUSH: 3.0000 mL | INTRAVENOUS | Status: DC | PRN

## 2020-05-15 MED ORDER — ASPIRIN 81 MG PO CHEW
81.0000 mg | CHEWABLE_TABLET | Freq: Every day | ORAL | Status: DC
  Administered 2020-05-16: 81 mg via ORAL
  Filled 2020-05-15: qty 1

## 2020-05-15 MED ORDER — ALUM & MAG HYDROXIDE-SIMETH 200-200-20 MG/5ML PO SUSP
30.0000 mL | Freq: Once | ORAL | Status: AC
  Administered 2020-05-15: 30 mL via ORAL

## 2020-05-15 MED ORDER — HEPARIN (PORCINE) IN NACL 2000-0.9 UNIT/L-% IV SOLN
INTRAVENOUS | Status: DC | PRN
  Administered 2020-05-15: 1000 mL

## 2020-05-15 MED ORDER — ASPIRIN 81 MG PO CHEW
CHEWABLE_TABLET | ORAL | Status: DC | PRN
  Administered 2020-05-15: 243 mg via ORAL

## 2020-05-15 MED ORDER — TIOTROPIUM BROMIDE MONOHYDRATE 18 MCG IN CAPS
18.0000 ug | ORAL_CAPSULE | Freq: Every day | RESPIRATORY_TRACT | Status: DC
  Administered 2020-05-15 – 2020-05-16 (×2): 18 ug via RESPIRATORY_TRACT
  Filled 2020-05-15: qty 5

## 2020-05-15 MED ORDER — IOHEXOL 300 MG/ML  SOLN
INTRAMUSCULAR | Status: DC | PRN
  Administered 2020-05-15: 80 mL

## 2020-05-15 MED ORDER — INSULIN ASPART PROT & ASPART (70-30 MIX) 100 UNIT/ML ~~LOC~~ SUSP
60.0000 [IU] | Freq: Every day | SUBCUTANEOUS | Status: DC
  Administered 2020-05-15: 60 [IU] via SUBCUTANEOUS
  Filled 2020-05-15: qty 10

## 2020-05-15 MED ORDER — FLUTICASONE PROPIONATE 50 MCG/ACT NA SUSP
2.0000 | Freq: Every day | NASAL | Status: DC
  Administered 2020-05-16: 2 via NASAL
  Filled 2020-05-15: qty 16

## 2020-05-15 MED ORDER — LISINOPRIL 10 MG PO TABS
10.0000 mg | ORAL_TABLET | Freq: Every day | ORAL | Status: DC
  Filled 2020-05-15: qty 1

## 2020-05-15 MED ORDER — TRAMADOL HCL 50 MG PO TABS: 50.0000 mg | ORAL_TABLET | Freq: Four times a day (QID) | ORAL | Status: DC | PRN

## 2020-05-15 MED ORDER — PRAVASTATIN SODIUM 10 MG PO TABS
10.0000 mg | ORAL_TABLET | Freq: Every day | ORAL | Status: DC
  Administered 2020-05-15: 10 mg via ORAL
  Filled 2020-05-15 (×2): qty 1

## 2020-05-15 MED ORDER — CLOPIDOGREL BISULFATE 75 MG PO TABS
ORAL_TABLET | ORAL | Status: AC
  Filled 2020-05-15: qty 8

## 2020-05-15 MED ORDER — METOPROLOL TARTRATE 50 MG PO TABS
50.0000 mg | ORAL_TABLET | Freq: Two times a day (BID) | ORAL | Status: DC
  Administered 2020-05-15 – 2020-05-16 (×2): 50 mg via ORAL
  Filled 2020-05-15 (×2): qty 1

## 2020-05-15 MED ORDER — MIDAZOLAM HCL 2 MG/2ML IJ SOLN
INTRAMUSCULAR | Status: AC
  Filled 2020-05-15: qty 2

## 2020-05-15 MED ORDER — AMITRIPTYLINE HCL 25 MG PO TABS
25.0000 mg | ORAL_TABLET | Freq: Every day | ORAL | Status: DC
  Filled 2020-05-15: qty 1

## 2020-05-15 MED ORDER — CLOPIDOGREL BISULFATE 75 MG PO TABS
ORAL_TABLET | ORAL | Status: DC | PRN
  Administered 2020-05-15: 600 mg via ORAL

## 2020-05-15 MED ORDER — ALBUTEROL SULFATE (2.5 MG/3ML) 0.083% IN NEBU: 2.5000 mg | INHALATION_SOLUTION | RESPIRATORY_TRACT | Status: DC | PRN

## 2020-05-15 MED ORDER — ASPIRIN 81 MG PO CHEW
CHEWABLE_TABLET | ORAL | Status: AC
  Filled 2020-05-15: qty 3

## 2020-05-15 MED ORDER — GABAPENTIN 300 MG PO CAPS
600.0000 mg | ORAL_CAPSULE | Freq: Every day | ORAL | Status: DC
  Administered 2020-05-15: 600 mg via ORAL
  Filled 2020-05-15: qty 2

## 2020-05-15 MED ORDER — CLOPIDOGREL BISULFATE 75 MG PO TABS
75.0000 mg | ORAL_TABLET | Freq: Every day | ORAL | Status: DC
  Administered 2020-05-16: 75 mg via ORAL
  Filled 2020-05-15: qty 1

## 2020-05-15 MED ORDER — SODIUM CHLORIDE 0.9 % IV SOLN: 250.0000 mL | INTRAVENOUS | Status: DC | PRN

## 2020-05-15 MED ORDER — ALUM & MAG HYDROXIDE-SIMETH 200-200-20 MG/5ML PO SUSP
ORAL | Status: AC
  Filled 2020-05-15: qty 30

## 2020-05-15 MED ORDER — HEPARIN (PORCINE) IN NACL 1000-0.9 UT/500ML-% IV SOLN
INTRAVENOUS | Status: AC
  Filled 2020-05-15: qty 1000

## 2020-05-15 MED ORDER — LABETALOL HCL 5 MG/ML IV SOLN: 10.0000 mg | INTRAVENOUS | Status: AC | PRN

## 2020-05-15 MED ORDER — ONDANSETRON HCL 4 MG/2ML IJ SOLN: 4.0000 mg | Freq: Four times a day (QID) | INTRAMUSCULAR | Status: DC | PRN

## 2020-05-15 MED ORDER — ASPIRIN 81 MG PO CHEW
CHEWABLE_TABLET | ORAL | Status: AC
  Filled 2020-05-15: qty 1

## 2020-05-15 MED ORDER — MIDAZOLAM HCL 2 MG/2ML IJ SOLN
INTRAMUSCULAR | Status: DC | PRN
  Administered 2020-05-15: 1 mg via INTRAVENOUS

## 2020-05-15 MED ORDER — SODIUM CHLORIDE 0.9% FLUSH
3.0000 mL | Freq: Two times a day (BID) | INTRAVENOUS | Status: DC
  Administered 2020-05-16: 3 mL via INTRAVENOUS

## 2020-05-15 MED ORDER — ISOSORBIDE MONONITRATE ER 30 MG PO TB24
30.0000 mg | ORAL_TABLET | Freq: Every day | ORAL | Status: DC
  Administered 2020-05-15 – 2020-05-16 (×2): 30 mg via ORAL
  Filled 2020-05-15 (×2): qty 1

## 2020-05-15 MED ORDER — BIVALIRUDIN BOLUS VIA INFUSION - CUPID
INTRAVENOUS | Status: DC | PRN
  Administered 2020-05-15: 68.7 mg via INTRAVENOUS

## 2020-05-15 MED ORDER — FENTANYL CITRATE (PF) 100 MCG/2ML IJ SOLN
INTRAMUSCULAR | Status: DC | PRN
  Administered 2020-05-15: 50 ug via INTRAVENOUS

## 2020-05-15 MED ORDER — ASPIRIN 81 MG PO CHEW
81.0000 mg | CHEWABLE_TABLET | ORAL | Status: AC
  Administered 2020-05-15: 81 mg via ORAL

## 2020-05-15 MED ORDER — HYDRALAZINE HCL 20 MG/ML IJ SOLN: 10.0000 mg | INTRAMUSCULAR | Status: AC | PRN

## 2020-05-15 MED ORDER — BIVALIRUDIN TRIFLUOROACETATE 250 MG IV SOLR
INTRAVENOUS | Status: AC
  Filled 2020-05-15: qty 250

## 2020-05-15 MED ORDER — IOHEXOL 300 MG/ML  SOLN
INTRAMUSCULAR | Status: DC | PRN
  Administered 2020-05-15: 150 mL via INTRA_ARTERIAL

## 2020-05-15 MED ORDER — SODIUM CHLORIDE 0.9 % WEIGHT BASED INFUSION
3.0000 mL/kg/h | INTRAVENOUS | Status: AC
  Administered 2020-05-15: 3 mL/kg/h via INTRAVENOUS

## 2020-05-15 MED ORDER — PANTOPRAZOLE SODIUM 40 MG PO TBEC
40.0000 mg | DELAYED_RELEASE_TABLET | Freq: Every day | ORAL | Status: DC
  Administered 2020-05-15 – 2020-05-16 (×2): 40 mg via ORAL
  Filled 2020-05-15 (×2): qty 1

## 2020-05-15 SURGICAL SUPPLY — 22 items
BALLN TREK RX 2.25X15 (BALLOONS) ×3
BALLOON TREK RX 2.25X15 (BALLOONS) ×2 IMPLANT
CATH INFINITI 5FR ANG PIGTAIL (CATHETERS) ×3 IMPLANT
CATH INFINITI 5FR JL4 (CATHETERS) ×3 IMPLANT
CATH INFINITI 5FR JL5 (CATHETERS) ×3 IMPLANT
CATH INFINITI JR4 5F (CATHETERS) ×3 IMPLANT
CATH SWANZ 7F THERMO (CATHETERS) ×3 IMPLANT
CATH VISTA GUIDE 6FR JR4 SH (CATHETERS) ×3 IMPLANT
DEVICE CLOSURE MYNXGRIP 6/7F (Vascular Products) ×6 IMPLANT
DEVICE INFLAT 30 PLUS (MISCELLANEOUS) ×3 IMPLANT
GUIDEWIRE EMER 3M J .025X150CM (WIRE) ×3 IMPLANT
KIT MANI 3VAL PERCEP (MISCELLANEOUS) ×3 IMPLANT
KIT RIGHT HEART (MISCELLANEOUS) ×3 IMPLANT
NEEDLE PERC 18GX7CM (NEEDLE) ×3 IMPLANT
PACK CARDIAC CATH (CUSTOM PROCEDURE TRAY) ×3 IMPLANT
SHEATH AVANTI 5FR X 11CM (SHEATH) IMPLANT
SHEATH AVANTI 6FR X 11CM (SHEATH) ×3 IMPLANT
SHEATH AVANTI 7FRX11 (SHEATH) ×3 IMPLANT
STENT RESOLUTE ONYX 2.5X26 (Permanent Stent) ×3 IMPLANT
WIRE ASAHI PROWATER 180CM (WIRE) ×3 IMPLANT
WIRE EMERALD ST .035X150CM (WIRE) ×3 IMPLANT
WIRE GUIDERIGHT .035X150 (WIRE) ×3 IMPLANT

## 2020-05-15 NOTE — H&P (Addendum)
Chief Complaint: Chief Complaint  Patient presents with  . Follow-up  holter and stress and echo  Date of Service: 05/06/2020 Date of Birth: 1946/05/05 PCP: No primary care provider on file.  History of Present Illness: Ms. Mayfield is a 74 y.o.female patient who has a history of moderate aortic stenosis who presents for a follow-up visit. She has multiple complaints including chest pain, jaw pain, arm pain but predominantly increasing shortness of breath. She has had this in the past however it is now more noticeable recently.. She has been evaluated both invasively and noninvasively. She has been evaluated with a left cardiac catheterization in 2017. This revealed small-vessel disease with preserved LV function. Her aortic valve did not appear critical at the time of cath. Recent echocardiogram revealed normal LV function with a peak gradient across the aortic valve of 44.5 with a mean gradient of 20.5. Valve area was estimated at 0.78 cm by continuity equation by echo. Her symptoms are more notable. She underwent a repeat left cardiac catheter in September 2018 which revealed no progression of her coronary disease. Valve area was greater than 1 cm. It did not worsen since previous cath. Coronaries had not significantly not progressed in severity and do not appear to require intervention by cath in 2018. She would repeat echo stress test and Holter due to progressive symptoms. The echo showed moderate aortic stenosis with a peak velocity of 3.55 m/s, 50.4 mmHg peak gradient and 24.2 mm mean gradient across the aortic valve with an aortic valve area calculated at 0.98 cm. This is not appreciably different than previous echo. LV function was normal. Holter showed no significant arrhythmias however a functional study showed borderline anterior ischemia. Given persistent symptoms and nonocclusive trivial disease in the LAD previously we will proceed with right and left heart cath to evaluate coronary  anatomy to guide further therapy. Past Medical and Surgical History  Past Medical History Past Medical History:  Diagnosis Date  . Aortic stenosis, moderate  . Basal cell carcinoma  . CAD (coronary artery disease)  3.0by 18 mm vision stent in the mid circumflex  . Cataract cortical, senile  . DM2 (diabetes mellitus, type 2) (CMS-HCC)  . Encounter for blood transfusion  . HTN (hypertension)  . Hyperlipidemia  . Iron deficiency anemia  a. hospitalization and 2U PRBC transfusion 10/2012  . Multinodular goiter  Biopsies of nodules in 2013 and 2019 were benign. No need for repeat neck imaging.  . Osteoporosis, post-menopausal  . PUD (peptic ulcer disease)  History of a bleeding ulcer   Past Surgical History She has a past surgical history that includes capsule endoscopy (N/A, 05/09/2013); esophagogastrodoudenoscopy w/biopsy (05/09/2013); Breast excisional biopsy; Cataract extraction (Bilateral); Incision Tendon Sheath For Trigger Finger (Bilateral); Coronary angioplasty; Stress test; Echocardiogram; colonoscopy (12/04/2012 (Inpt)); egd (04/04/2013); Basal cell removed; Trigger finger bilateral thumb; Carotid endarterectomy (Left, 07/2015); egd (05/31/2016 (Inpt)); Hysterectomy; Colonoscopy (07/10/2018); and egd (07/10/2018).   Medications and Allergies  Current Medications  Current Outpatient Medications  Medication Sig Dispense Refill  . albuterol 90 mcg/actuation inhaler Inhale 2 inhalations into the lungs every 4 (four) hours as needed for Wheezing 1 Inhaler 0  . alendronate (FOSAMAX) 70 MG tablet Take 1 tablet (70 mg total) by mouth every 7 (seven) days Take with a full glass of water. Do not lie down for the next 30 min. 12 tablet 3  . amitriptyline (ELAVIL) 25 MG tablet Take 1 tablet (25 mg total) by mouth nightly 30 tablet 11  . ceramides  1,3,6-11 (CERAVE) Crea Apply 1 application topically daily.  . diclofenac (VOLTAREN) 1 % topical gel Apply 4 g topically as needed for Pain  .  dulaglutide (TRULICITY) 1.5 WE/3.1 mL subcutaneous injection Inject 0.5 mLs (1.5 mg total) subcutaneously every 7 (seven) days for 30 days 2 mL 5  . fluticasone propionate (FLONASE) 50 mcg/actuation nasal spray Place 1 spray into both nostrils 2 (two) times daily 16 g 0  . gabapentin (NEURONTIN) 300 MG capsule Take 2 capsules (600 mg total) by mouth nightly 180 capsule 3  . isosorbide mononitrate (IMDUR) 30 MG ER tablet Take 1 tablet (30 mg total) by mouth once daily 30 tablet 11  . lisinopriL (ZESTRIL) 10 MG tablet Take 1 tablet (10 mg total) by mouth once daily 30 tablet 11  . metoprolol tartrate (LOPRESSOR) 100 MG tablet TAKE 1 & 1/2 (ONE & ONE-HALF) TABLETS BY MOUTH TWICE DAILY 270 tablet 0  . NOVOLIN 70/30 U-100 INSULIN 100 unit/mL (70-30) injection Inject 80 units every morning and 60 units every night 30 mL 3  . omeprazole (PRILOSEC) 40 MG DR capsule Take 1 capsule (40 mg total) by mouth 2 (two) times daily (Patient taking differently: Take 80 mg by mouth 2 (two) times daily ) 60 capsule 5  . ONETOUCH VERIO FLEX METER Misc Use to check sugars daily 1 kit 0  . ONETOUCH VERIO TEST STRIPS test strip USE TO CHECK BLOOD SUGAR THREE TIMES DAILY AS INSTRUCTED 300 each 1  . phenazopyridine (PYRIDIUM) 200 MG tablet Take 1 tablet by mouth 3 (three) times daily as needed for Pain with Urination  . pravastatin (PRAVACHOL) 10 MG tablet Take 10 mg by mouth nightly  . tiotropium (SPIRIVA) 18 mcg inhalation capsule Place 1 capsule (18 mcg total) into inhaler and inhale once daily 30 capsule 5  . traMADoL (ULTRAM) 50 mg tablet Take 1 tablet (50 mg total) by mouth every 6 (six) hours as needed 50 tablet 1   No current facility-administered medications for this visit.   Allergies: Aspirin, Canagliflozin-metformin, Invokana [canagliflozin], Lovastatin, Penicillins, and Vicodin [hydrocodone-acetaminophen]  Social and Family History  Social History reports that she quit smoking about 35 years ago. Her smoking  use included cigarettes. She quit after 20.00 years of use. She has never used smokeless tobacco. She reports that she does not drink alcohol and does not use drugs.  Family History Family History  Problem Relation Age of Onset  . Coronary Artery Disease (Blocked arteries around heart) Mother  . Diabetes type II Mother  . High blood pressure (Hypertension) Mother  . Osteoporosis (Thinning of bones) Mother  . Breast cancer Mother  . Uterine cancer Mother  . Coronary Artery Disease (Blocked arteries around heart) Father  . High blood pressure (Hypertension) Father  . Osteoporosis (Thinning of bones) Father  . Stroke Father  . Diabetes type II Father  . Diabetes type II Sister  . Cervical cancer Sister  . High blood pressure (Hypertension) Brother   Review of Systems  Review of Systems  Constitutional: Positive for malaise/fatigue. Negative for chills, diaphoresis, fever and weight loss.  HENT: Negative for congestion, ear discharge and tinnitus.  Eyes: Negative for blurred vision.  Respiratory: Positive for shortness of breath. Negative for cough, hemoptysis, sputum production and wheezing.  Cardiovascular: Positive for chest pain and palpitations. Negative for orthopnea, claudication, leg swelling and PND.  Gastrointestinal: Negative for blood in stool, constipation, diarrhea, melena and vomiting.  Genitourinary: Negative for dysuria, frequency, hematuria and urgency.  Musculoskeletal: Positive for  back pain and myalgias. Negative for falls and joint pain.  Skin: Negative for itching and rash.  Neurological: Positive for weakness. Negative for dizziness, tingling, focal weakness and loss of consciousness.  Endo/Heme/Allergies: Negative for polydipsia. Does not bruise/bleed easily.  Psychiatric/Behavioral: Negative for depression, memory loss and substance abuse. The patient has insomnia. The patient is not nervous/anxious.    Physical Examination   Vitals: BP (!) 160/92  Pulse  106  Resp 16  Ht 160 cm (5' 3" )  Wt 92.1 kg (203 lb)  LMP (LMP Unknown)  SpO2 96%  BMI 35.96 kg/m  Ht:160 cm (5' 3" ) Wt:92.1 kg (203 lb) MWN:UUVO surface area is 2.02 meters squared. Body mass index is 35.96 kg/m.  Wt Readings from Last 3 Encounters:  05/06/20 92.1 kg (203 lb)  04/07/20 92.1 kg (203 lb)  04/01/20 92.5 kg (204 lb)   BP Readings from Last 3 Encounters:  05/06/20 (!) 160/92  04/01/20 150/84  03/21/20 (!) 170/92  general: Female in no acute distress  LUNGS Breath Sounds: Normal Percussion: Normal  CARDIOVASCULAR JVP CV wave: no HJR: no Elevation at 90 degrees: None Carotid Pulse: normal pulsation bilaterally Bruit: None Apex: apical impulse normal  Auscultation Rhythm: normal sinus rhythm S1: normal S2: normal Clicks: no Rub: no Murmurs: 3/6 medium pitched crescendo-decrescendo harsh at lower left sternal border, at upper left sternal border, at upper right sternal border  Gallop: None  EXTREMITIES Clubbing: no Edema: trace to 1+ bilateral pedal edema Pulses: peripheral pulses symmetrical Femoral Bruits: no Amputation: no SKIN Rash: no Cyanosis: no Embolic phemonenon: no Bruising: no NEURO Alert and Oriented to person, place and time: yes Non focal: yes LABS Last 3 CBC results: Lab Results  Component Value Date  WBC 4.5 05/06/2020  WBC 4.6 07/04/2018  WBC 5.3 06/26/2018   Lab Results  Component Value Date  HGB 12.0 05/06/2020  HGB 9.1 (L) 07/04/2018  HGB 9.1 (L) 06/26/2018   Lab Results  Component Value Date  HCT 38.4 05/06/2020  HCT 28.8 (L) 07/04/2018  HCT 28.7 (L) 06/26/2018   Lab Results  Component Value Date  PLT 274 05/06/2020  PLT 292 07/04/2018  PLT 297 06/26/2018   Lab Results  Component Value Date  CREATININE 0.8 03/14/2020  BUN 13 03/14/2020  NA 142 03/14/2020  K 4.3 03/14/2020  CL 103 03/14/2020  CO2 32.5 (H) 03/14/2020   Lab Results  Component Value Date  HGBA1C 8.0 (H) 03/14/2020   Lab  Results  Component Value Date  ALT 18 02/08/2019  AST 16 02/08/2019  ALKPHOS 97 02/08/2019     Assessment and Plan   74 y.o. female with  ICD-10-CM ICD-9-CM  1. Coronary artery disease involving native coronary artery of native heart- Aortic valve is noncritical. Cardiac catheterization in September 2017 did not show any significant progression of coronary disease. Medical management was recommended. Most recent cardiac catheterization in September 2018 revealed no significant change from the previous one. Her symptoms have worsened. Functional study showed borderline anterior ischemia. Given progressive symptoms we will proceed with a left heart cath to evaluate coronary anatomy. We will also attempt to cross aortic valve to better evaluate the aortic stenosis. Further recommendations after this is complete. I25.10 414.01  2. Pure hypercholesterolemia-Will continue with low-fat diet. LDL goal of less than 100 is recommended. E78.0 272.0  3. Essential hypertension-blood pressure is now well controlled. She is on metoprolol 100 twice daily but is resistant to taking losartan. We will try increasing the metoprolol tartrate to  150 twice daily.Marland Kitchen DASH diet is also recommended I10 401.9  4. Type 2 diabetes mellitus with other circulatory complications-patient will remain on insulin with an hemoglobin A1c goal of less than 6. Most recent was 10.8. Strict adherence to ADA diet is recommended E11.59 250.70  5. Palpitations-continue with metoprolol and avoiding stimulant. R00.2 785.1  6. Aortic stenosis, moderate to severe-as per above. Right and left heart cath to better evaluate anatomy. Sleep study is pending I35.0 424.1   Return in about 4 weeks (around 06/03/2020).  These notes generated with voice recognition software. I apologize for typographical errors.  Sydnee Levans, MD   Pt seen and examined. No change from above.

## 2020-05-16 DIAGNOSIS — I25119 Atherosclerotic heart disease of native coronary artery with unspecified angina pectoris: Secondary | ICD-10-CM | POA: Diagnosis not present

## 2020-05-16 DIAGNOSIS — I350 Nonrheumatic aortic (valve) stenosis: Secondary | ICD-10-CM | POA: Diagnosis not present

## 2020-05-16 LAB — CBC
HCT: 33 % — ABNORMAL LOW (ref 36.0–46.0)
Hemoglobin: 10.7 g/dL — ABNORMAL LOW (ref 12.0–15.0)
MCH: 26.7 pg (ref 26.0–34.0)
MCHC: 32.4 g/dL (ref 30.0–36.0)
MCV: 82.3 fL (ref 80.0–100.0)
Platelets: 241 10*3/uL (ref 150–400)
RBC: 4.01 MIL/uL (ref 3.87–5.11)
RDW: 14.1 % (ref 11.5–15.5)
WBC: 5.3 10*3/uL (ref 4.0–10.5)
nRBC: 0 % (ref 0.0–0.2)

## 2020-05-16 LAB — BASIC METABOLIC PANEL
Anion gap: 6 (ref 5–15)
BUN: 18 mg/dL (ref 8–23)
CO2: 27 mmol/L (ref 22–32)
Calcium: 8.9 mg/dL (ref 8.9–10.3)
Chloride: 106 mmol/L (ref 98–111)
Creatinine, Ser: 0.8 mg/dL (ref 0.44–1.00)
GFR calc Af Amer: >60 mL/min (ref >60)
GFR calc non Af Amer: >60 mL/min (ref >60)
Glucose, Bld: 121 mg/dL — ABNORMAL HIGH (ref 70–99)
Potassium: 4.6 mmol/L (ref 3.5–5.1)
Sodium: 139 mmol/L (ref 135–145)

## 2020-05-16 LAB — GLUCOSE, CAPILLARY: Glucose-Capillary: 106 mg/dL — ABNORMAL HIGH (ref 70–99)

## 2020-05-16 MED ORDER — CLOPIDOGREL BISULFATE 75 MG PO TABS: 75.0000 mg | ORAL_TABLET | Freq: Every day | ORAL | 11 refills | Status: DC

## 2020-05-16 MED ORDER — ASPIRIN 81 MG PO CHEW: 81.0000 mg | CHEWABLE_TABLET | Freq: Every day | ORAL | 5 refills | Status: DC

## 2020-05-16 NOTE — Addendum Note (Signed)
Addended by: Verlin Grills on: 05/16/2020 03:49 PM   Modules accepted: Orders

## 2020-05-16 NOTE — Patient Outreach (Addendum)
[facility]) Care Management  [place]  [id number]  Joyce Potter 12-17-1946 [id number]  RN Health Coach telephone received telephone call from patient.  [employer] compliance verified.Per patient her blood pressure was 173/60. Patient has been having some chest pain. Per patient she is to have a cardiac cath tomorrow. Patient stated she eats mostly chicken,turkey bacon, and vegetables. Patient does not eat any pork. Patient will be picking up a CPAP machine after her cardiac cath. Per patient she has been having episodes of crying. Patient stated that she does not want to speak with therapist. Per patient she thinks it will get better once she has the cath. Per patient she has transportation and her son helps with supplying food. Patient has agreed to further outreach calls.    Encounter Medications:  Outpatient Encounter Medications as of 05/14/2020  Medication Sig Note  . albuterol (PROAIR HFA) 108 (90 Base) MCG/ACT inhaler Inhale 2 puffs into the lungs every 4 (four) hours as needed for wheezing or shortness of breath.    Marland Kitchen alendronate (FOSAMAX) 70 MG tablet Take 70 mg by mouth once a week.    Marland Kitchen amitriptyline (ELAVIL) 25 MG tablet Take 25 mg by mouth at bedtime as needed for sleep.   Marland Kitchen diclofenac Sodium (VOLTAREN) 1 % GEL Apply 2 g topically 4 (four) times daily as needed (pain).   Marland Kitchen gabapentin (NEURONTIN) 300 MG capsule Take 300 mg by mouth 2 (two) times daily.    . insulin NPH-regular Human (NOVOLIN 70/30) (70-30) 100 UNIT/ML injection Inject 60-80 Units into the skin See admin instructions. Inject 80 units subcutaneously every morning and 60 units every afternoon   . Insulin Syringe-Needle U-100 (INSULIN SYRINGE 1CC/30GX5/16") 30G X 5/16" 1 ML MISC USE ONE TWICE DAILY WITH INSULIN   . isosorbide mononitrate (IMDUR) 30 MG 24 hr tablet Take 30 mg by mouth daily.   Marland Kitchen lisinopril (ZESTRIL) 10 MG tablet Take 10 mg by mouth daily.   . metoprolol tartrate (LOPRESSOR) 100  MG tablet Take 100 mg by mouth 2 (two) times daily.  05/07/2020: Pt takes 100 mg tablet and 50 mg tablet for a dose of 150 mg bid  . omeprazole (PRILOSEC) 40 MG capsule Take 40 mg by mouth 2 (two) times daily.   . pravastatin (PRAVACHOL) 20 MG tablet Take 20 mg by mouth at bedtime.   Marland Kitchen tiotropium (SPIRIVA HANDIHALER) 18 MCG inhalation capsule Place 18 mcg into inhaler and inhale daily.    . traMADol (ULTRAM) 50 MG tablet Take 50 mg by mouth every 6 (six) hours as needed for moderate pain.   . TRULICITY 1.5 0000000 SOPN Inject 0.5 mLs into the skin once a week.   . [DISCONTINUED] acetaminophen (TYLENOL) 325 MG tablet Take 2 tablets (650 mg total) by mouth every 6 (six) hours as needed for mild pain (or Fever >/= 101). (Patient not taking: Reported on 05/07/2020)   . [DISCONTINUED] metoprolol tartrate (LOPRESSOR) 50 MG tablet Take 2 tablets (100 mg total) by mouth 2 (two) times daily. (Patient taking differently: Take 50 mg by mouth 2 (two) times daily. ) 05/07/2020: Pt takes 100 mg tablet and 50 mg tablet for a dose of 150 mg bid  . [DISCONTINUED] pravastatin (PRAVACHOL) 10 MG tablet Take 1 tablet (10 mg total) by mouth daily at 6 PM. (Patient not taking: Reported on 05/07/2020)    Facility-Administered Encounter Medications as of 05/14/2020  Medication  . heparin lock flush 100 unit/mL  . sodium chloride flush (NS)  0.9 % injection 10 mL    Functional Status:  In your present state of health, do you have any difficulty performing the following activities: 05/15/2020 05/15/2020  Hearing? - N  Vision? - N  Difficulty concentrating or making decisions? - N  Walking or climbing stairs? - N  Comment - -  Dressing or bathing? - N  Doing errands, shopping? N -  Preparing Food and eating ? - -  Using the Toilet? - -  In the past six months, have you accidently leaked urine? - -  Do you have problems with loss of bowel control? - -  Managing your Medications? - -  Managing your Finances? - -   Housekeeping or managing your Housekeeping? - -  Some recent data might be hidden    Fall/Depression Screening: Fall Risk  05/14/2020 05/12/2017 01/27/2017  Falls in the past year? 1 No No  Number falls in past yr: 1 - -  Injury with Fall? 0 - -  Risk for fall due to : History of fall(s);Impaired balance/gait;Impaired mobility - -  Follow up Falls evaluation completed;Falls prevention discussed - -   PHQ 2/9 Scores 05/14/2020  PHQ - 2 Score 0   THN CM Care Plan Problem One     Most Recent Value  Care Plan Problem One  Knowledge Deficit is self management  Role Documenting the Problem One  [place] for Problem One  Active  THN Long Term Goal   Patient blood pressure will be 140/80 or less within the next 90 days  THN Long Term Goal Start Date  05/14/20  Interventions for Problem One Long Term Goal  RN discussed with patient about what the blood pressure range should be.Patient will monitor blood pressure and document. RN will follow up with further discussion  THN CM Short Term Goal #1   RN will verbalize receiving calendar book for document blood pressures within the next 30 days  THN CM Short Term Goal #1 Start Date  05/14/20  Interventions for Short Term Goal #1  RN discussed monitoring blood pressures and documenting. RN will follow up for compliance  THN CM Short Term Goal #2   Patient will verbalize understanding signs and syptomsof elevated blood pressure within the next 30 days  THN CM Short Term Goal #2 Start Date  05/14/20  Interventions for Short Term Goal #2  RN discussed signs and symptoms of elevated blood pressure. RN sent patient educational material on A matter of Choice blood pressure control. RN will follow up with further discussion  THN CM Short Term Goal #3  Patient will verbalize receiving picture information on high and low salt foods within the next 30 days  THN CM Short Term Goal #3 Start Date  05/14/20  Interventions for Short Tern Goal #3  RN  discussed with patient about foods that are high and low in sodium. RN sent patient picture sheet on high and low foods.  RN sent patient picture sheet on how to read labels. RN will follow up for further discussion.       Assessment:  Patient BP was 173/60 Patient has received COVID vaccine Patient is having difficulty affording Lantus and Spiriva Patient has recent fall with injuries Patient will benefit from [place] telephonic outreach for education and support for hypertension self management.  Plan:  RN discussed patient blood pressure and goal range RN sent A matter of choice blood pressure control RN sent 2021 Calendar book RN discussed  low sodium diet RN sent picture sheet of foods high and low in sodium Patient will pick up CPAP machine after cardiac cath Referred to Pharmacy for assistance with Lantus and Spiriva RN will follow up with further discussion within the month of July  Dallan Schonberg [place] [facility] [phone]

## 2020-05-16 NOTE — TOC Progression Note (Signed)
Transition of Care [facility]) - Progression Note    Patient Details  Name: Joyce Potter MRN: [id number] Date of Birth: 30-Nov-1946  Transition of Care [facility]) CM/SW [place], RN Phone Number: 05/16/2020, 10:47 AM  Clinical Narrative:    Met with patient at discharge, she is asking about assistance for medications.  Her daughter was in room and can help patient apply for programs for Spiriva and Lantus.  I showed daughter how to access information.  She acknowledged she can help her mom with this. No further TOC needs at this time, please re-consult for new needs.         Expected Discharge Plan and Services           Expected Discharge Date: 05/16/20                                     Social Determinants of Health (SDOH) Interventions    Readmission Risk Interventions No flowsheet data found.

## 2020-05-16 NOTE — Discharge Summary (Signed)
Physician Discharge Summary  Patient ID: Joyce Potter MRN: [id number] DOB/AGE: [birth year] 74 y.o.  Admit date: 05/15/2020 Discharge date: 05/16/2020  Primary Discharge Diagnosis coronary artery disease Secondary Discharge Diagnosis chest pain  Significant Diagnostic Studies: yes  Consults: None  Hospital Course: The patient underwent elective cardiac catheterization on 05/15/2020 revealed one-vessel coronary artery disease with 95% stenosis small RCA, patent stent proximal to mid left circumflex, and moderate aortic stenosis with calculated aortic valve area 0.99 cm with mean gradient 18.26 mmHg.  Patient underwent successful PCI with 2.5 by 26 mm Resolute Onyx eluting stent in the proximal RCA.  Patient had an uncomplicated hospital course without recurrent chest pain.  Patient ambulated without difficulty and was discharged home 05/16/2020 in stable condition.   Discharge Exam: Blood pressure 130/67, pulse 74, temperature 98 F (36.7 C), temperature source Oral, resp. rate 18, height 5\' 3"  (1.6 m), weight 92.6 kg, SpO2 96 %.   General appearance: alert and cooperative Head: Normocephalic, without obvious abnormality, atraumatic Eyes: conjunctivae/corneas clear. PERRL, EOM's intact. Fundi benign. Ears: normal TM's and external ear canals both ears Nose: Nares normal. Septum midline. Mucosa normal. No drainage or sinus tenderness. Throat: lips, mucosa, and tongue normal; teeth and gums normal Neck: no adenopathy, no carotid bruit, no JVD, supple, symmetrical, trachea midline and thyroid not enlarged, symmetric, no tenderness/mass/nodules Back: symmetric, no curvature. ROM normal. No CVA tenderness. Resp: clear to auscultation bilaterally Cardio: regular rate and rhythm, S1, S2 normal, no murmur, click, rub or gallop GI: soft, non-tender; bowel sounds normal; no masses,  no organomegaly Extremities: extremities normal, atraumatic, no cyanosis or edema Pulses: 2+ and  symmetric Skin: Skin color, texture, turgor normal. No rashes or lesions Labs:   Lab Results  Component Value Date   WBC 5.3 05/16/2020   HGB 10.7 (L) 05/16/2020   HCT 33.0 (L) 05/16/2020   MCV 82.3 05/16/2020   PLT 241 05/16/2020    Recent Labs  Lab 05/16/20 0604  NA 139  K 4.6  CL 106  CO2 27  BUN 18  CREATININE 0.80  CALCIUM 8.9  GLUCOSE 121*      Radiology:  EKG: Sinus rhythm  FOLLOW UP PLANS AND APPOINTMENTS Discharge Instructions    AMB Referral to Cardiac Rehabilitation - Phase II   Complete by: As directed    Diagnosis: Coronary Stents   After initial evaluation and assessments completed: Virtual Based Care may be provided alone or in conjunction with Phase 2 Cardiac Rehab based on patient barriers.: Yes     Allergies as of 05/16/2020      Reactions   Aspirin Other (See Comments)   ulcers   Invokamet [canagliflozin-metformin Hcl] Other (See Comments)   Yeast infection   Sulfa Antibiotics Other (See Comments)   "Got Drunk"   Lovastatin Rash   Penicillins Rash   Has patient had a PCN reaction causing immediate rash, facial/tongue/throat swelling, SOB or lightheadedness with hypotension:Yes Has patient had a PCN reaction causing severe rash involving mucus membranes or skin necrosis:all over body Has patient had a PCN reaction that required hospitalization: No Has patient had a PCN reaction occurring within the last 10 years: No If all of the above answers are "NO", then may proceed with Cephalosporin use.   Vicodin [hydrocodone-acetaminophen] Rash      Medication List    STOP taking these medications   acetaminophen 325 MG tablet Commonly known as: TYLENOL     TAKE these medications   alendronate 70 MG tablet Commonly  known as: FOSAMAX Take 70 mg by mouth once a week.   amitriptyline 25 MG tablet Commonly known as: ELAVIL Take 25 mg by mouth at bedtime as needed for sleep.   aspirin 81 MG chewable tablet Chew 1 tablet (81 mg total) by mouth  daily.   clopidogrel 75 MG tablet Commonly known as: PLAVIX Take 1 tablet (75 mg total) by mouth daily with breakfast.   diclofenac Sodium 1 % Gel Commonly known as: VOLTAREN Apply 2 g topically 4 (four) times daily as needed (pain).   gabapentin 300 MG capsule Commonly known as: NEURONTIN Take 300 mg by mouth 2 (two) times daily.   insulin NPH-regular Human (70-30) 100 UNIT/ML injection Inject 60-80 Units into the skin See admin instructions. Inject 80 units subcutaneously every morning and 60 units every afternoon   INSULIN SYRINGE 1CC/30GX5/16" 30G X 5/16" 1 ML Misc USE ONE TWICE DAILY WITH INSULIN   isosorbide mononitrate 30 MG 24 hr tablet Commonly known as: IMDUR Take 30 mg by mouth daily.   lisinopril 10 MG tablet Commonly known as: ZESTRIL Take 10 mg by mouth daily.   metoprolol tartrate 100 MG tablet Commonly known as: LOPRESSOR Take 100 mg by mouth 2 (two) times daily.   omeprazole 40 MG capsule Commonly known as: PRILOSEC Take 40 mg by mouth 2 (two) times daily.   pravastatin 20 MG tablet Commonly known as: PRAVACHOL Take 20 mg by mouth at bedtime. What changed: Another medication with the same name was removed. Continue taking this medication, and follow the directions you see here.   ProAir HFA 108 (90 Base) MCG/ACT inhaler Generic drug: albuterol Inhale 2 puffs into the lungs every 4 (four) hours as needed for wheezing or shortness of breath.   Spiriva HandiHaler 18 MCG inhalation capsule Generic drug: tiotropium Place 18 mcg into inhaler and inhale daily.   traMADol 50 MG tablet Commonly known as: ULTRAM Take 50 mg by mouth every 6 (six) hours as needed for moderate pain.   Trulicity 1.5 0000000 Sopn Generic drug: Dulaglutide Inject 0.5 mLs into the skin once a week.      Follow-up Information    Teodoro Spray, MD Follow up in 1 week(s).   Specialty: Cardiology Contact information: [place]  [id number] [phone]           BRING ALL MEDICATIONS WITH YOU TO FOLLOW UP APPOINTMENTS  Time spent with patient to include physician time: 25 minutes Signed:  Isaias Cowman MD, PhD, [facility] 05/16/2020, 7:46 AM

## 2020-05-16 NOTE — Plan of Care (Signed)
  Problem: Education: Goal: Knowledge of General Education information will improve Description: Including pain rating scale, medication(s)/side effects and non-pharmacologic comfort measures Outcome: Progressing   Problem: Clinical Measurements: Goal: Cardiovascular complication will be avoided Outcome: Progressing   Problem: Activity: Goal: Risk for activity intolerance will decrease Outcome: Progressing   Problem: Pain Managment: Goal: General experience of comfort will improve Outcome: Progressing   Problem: Activity: Goal: Ability to return to baseline activity level will improve Outcome: Progressing   Problem: Activity: Goal: Ability to return to baseline activity level will improve Outcome: Progressing   Problem: Cardiovascular: Goal: Ability to achieve and maintain adequate cardiovascular perfusion will improve Outcome: Progressing Goal: Vascular access site(s) Level 0-1 will be maintained Outcome: Progressing

## 2020-05-16 NOTE — Progress Notes (Signed)
Discharge instructions explained to pt/ verbalized an understanding / iv and tele removed/ transported off unit via wheelchair.  

## 2020-05-20 ENCOUNTER — Other Ambulatory Visit: Payer: Self-pay

## 2020-05-20 ENCOUNTER — Encounter: Payer: Self-pay | Admitting: Emergency Medicine

## 2020-05-20 ENCOUNTER — Inpatient Hospital Stay
Admission: EM | Admit: 2020-05-20 | Discharge: 2020-05-23 | DRG: 379 | Disposition: A | Discharge status: Home / Self Care | Payer: Medicare HMO | Source: Ambulatory Visit | Attending: Internal Medicine | Admitting: Internal Medicine

## 2020-05-20 DIAGNOSIS — E785 Hyperlipidemia, unspecified: Secondary | ICD-10-CM | POA: Diagnosis not present

## 2020-05-20 DIAGNOSIS — Z85828 Personal history of other malignant neoplasm of skin: Secondary | ICD-10-CM

## 2020-05-20 DIAGNOSIS — I10 Essential (primary) hypertension: Secondary | ICD-10-CM | POA: Diagnosis present

## 2020-05-20 DIAGNOSIS — Z882 Allergy status to sulfonamides status: Secondary | ICD-10-CM

## 2020-05-20 DIAGNOSIS — K922 Gastrointestinal hemorrhage, unspecified: Secondary | ICD-10-CM | POA: Diagnosis present

## 2020-05-20 DIAGNOSIS — K274 Chronic or unspecified peptic ulcer, site unspecified, with hemorrhage: Secondary | ICD-10-CM | POA: Diagnosis not present

## 2020-05-20 DIAGNOSIS — K648 Other hemorrhoids: Secondary | ICD-10-CM | POA: Diagnosis present

## 2020-05-20 DIAGNOSIS — I2510 Atherosclerotic heart disease of native coronary artery without angina pectoris: Secondary | ICD-10-CM | POA: Diagnosis present

## 2020-05-20 DIAGNOSIS — Z825 Family history of asthma and other chronic lower respiratory diseases: Secondary | ICD-10-CM

## 2020-05-20 DIAGNOSIS — Z803 Family history of malignant neoplasm of breast: Secondary | ICD-10-CM

## 2020-05-20 DIAGNOSIS — Z886 Allergy status to analgesic agent status: Secondary | ICD-10-CM

## 2020-05-20 DIAGNOSIS — K279 Peptic ulcer, site unspecified, unspecified as acute or chronic, without hemorrhage or perforation: Secondary | ICD-10-CM | POA: Diagnosis not present

## 2020-05-20 DIAGNOSIS — Z888 Allergy status to other drugs, medicaments and biological substances status: Secondary | ICD-10-CM

## 2020-05-20 DIAGNOSIS — Z833 Family history of diabetes mellitus: Secondary | ICD-10-CM

## 2020-05-20 DIAGNOSIS — Z8049 Family history of malignant neoplasm of other genital organs: Secondary | ICD-10-CM

## 2020-05-20 DIAGNOSIS — I350 Nonrheumatic aortic (valve) stenosis: Secondary | ICD-10-CM | POA: Diagnosis present

## 2020-05-20 DIAGNOSIS — E1136 Type 2 diabetes mellitus with diabetic cataract: Secondary | ICD-10-CM | POA: Diagnosis present

## 2020-05-20 DIAGNOSIS — Z955 Presence of coronary angioplasty implant and graft: Secondary | ICD-10-CM

## 2020-05-20 DIAGNOSIS — K219 Gastro-esophageal reflux disease without esophagitis: Secondary | ICD-10-CM | POA: Diagnosis present

## 2020-05-20 DIAGNOSIS — M810 Age-related osteoporosis without current pathological fracture: Secondary | ICD-10-CM | POA: Diagnosis present

## 2020-05-20 DIAGNOSIS — E119 Type 2 diabetes mellitus without complications: Secondary | ICD-10-CM

## 2020-05-20 DIAGNOSIS — Z03818 Encounter for observation for suspected exposure to other biological agents ruled out: Secondary | ICD-10-CM | POA: Diagnosis not present

## 2020-05-20 DIAGNOSIS — Z823 Family history of stroke: Secondary | ICD-10-CM

## 2020-05-20 DIAGNOSIS — K449 Diaphragmatic hernia without obstruction or gangrene: Secondary | ICD-10-CM | POA: Diagnosis present

## 2020-05-20 DIAGNOSIS — D500 Iron deficiency anemia secondary to blood loss (chronic): Secondary | ICD-10-CM | POA: Diagnosis present

## 2020-05-20 DIAGNOSIS — Z8673 Personal history of transient ischemic attack (TIA), and cerebral infarction without residual deficits: Secondary | ICD-10-CM

## 2020-05-20 DIAGNOSIS — Z20822 Contact with and (suspected) exposure to covid-19: Secondary | ICD-10-CM | POA: Diagnosis present

## 2020-05-20 DIAGNOSIS — K921 Melena: Secondary | ICD-10-CM | POA: Diagnosis not present

## 2020-05-20 DIAGNOSIS — E042 Nontoxic multinodular goiter: Secondary | ICD-10-CM | POA: Diagnosis present

## 2020-05-20 DIAGNOSIS — Z885 Allergy status to narcotic agent status: Secondary | ICD-10-CM

## 2020-05-20 DIAGNOSIS — Z7983 Long term (current) use of bisphosphonates: Secondary | ICD-10-CM

## 2020-05-20 DIAGNOSIS — Z880 Allergy status to penicillin: Secondary | ICD-10-CM

## 2020-05-20 DIAGNOSIS — D62 Acute posthemorrhagic anemia: Secondary | ICD-10-CM | POA: Diagnosis not present

## 2020-05-20 DIAGNOSIS — Z79899 Other long term (current) drug therapy: Secondary | ICD-10-CM

## 2020-05-20 DIAGNOSIS — F329 Major depressive disorder, single episode, unspecified: Secondary | ICD-10-CM | POA: Diagnosis present

## 2020-05-20 DIAGNOSIS — Z794 Long term (current) use of insulin: Secondary | ICD-10-CM

## 2020-05-20 DIAGNOSIS — E1151 Type 2 diabetes mellitus with diabetic peripheral angiopathy without gangrene: Secondary | ICD-10-CM | POA: Diagnosis present

## 2020-05-20 DIAGNOSIS — Z87891 Personal history of nicotine dependence: Secondary | ICD-10-CM

## 2020-05-20 DIAGNOSIS — K2970 Gastritis, unspecified, without bleeding: Secondary | ICD-10-CM | POA: Diagnosis present

## 2020-05-20 LAB — CBC
HCT: 27.1 % — ABNORMAL LOW (ref 36.0–46.0)
HCT: 26.8 % — ABNORMAL LOW (ref 36.0–46.0)
HCT: 26.3 % — ABNORMAL LOW (ref 36.0–46.0)
Hemoglobin: 8.6 g/dL — ABNORMAL LOW (ref 12.0–15.0)
Hemoglobin: 8.6 g/dL — ABNORMAL LOW (ref 12.0–15.0)
Hemoglobin: 8.4 g/dL — ABNORMAL LOW (ref 12.0–15.0)
MCH: 26.9 pg (ref 26.0–34.0)
MCH: 26.3 pg (ref 26.0–34.0)
MCH: 27 pg (ref 26.0–34.0)
MCHC: 31.7 g/dL (ref 30.0–36.0)
MCHC: 32.1 g/dL (ref 30.0–36.0)
MCHC: 31.9 g/dL (ref 30.0–36.0)
MCV: 84.7 fL (ref 80.0–100.0)
MCV: 82 fL (ref 80.0–100.0)
MCV: 84.6 fL (ref 80.0–100.0)
Platelets: 255 10*3/uL (ref 150–400)
Platelets: 248 10*3/uL (ref 150–400)
Platelets: 246 10*3/uL (ref 150–400)
RBC: 3.2 MIL/uL — ABNORMAL LOW (ref 3.87–5.11)
RBC: 3.27 MIL/uL — ABNORMAL LOW (ref 3.87–5.11)
RBC: 3.11 MIL/uL — ABNORMAL LOW (ref 3.87–5.11)
RDW: 14.3 % (ref 11.5–15.5)
RDW: 14.4 % (ref 11.5–15.5)
RDW: 14.4 % (ref 11.5–15.5)
WBC: 4.6 10*3/uL (ref 4.0–10.5)
WBC: 4.9 10*3/uL (ref 4.0–10.5)
WBC: 5 10*3/uL (ref 4.0–10.5)
nRBC: 0 % (ref 0.0–0.2)
nRBC: 0 % (ref 0.0–0.2)
nRBC: 0 % (ref 0.0–0.2)

## 2020-05-20 LAB — TYPE AND SCREEN
ABO/RH(D): O POS
Antibody Screen: NEGATIVE

## 2020-05-20 LAB — COMPREHENSIVE METABOLIC PANEL
ALT: 19 U/L (ref 0–44)
AST: 10 U/L — ABNORMAL LOW (ref 15–41)
Albumin: 3.6 g/dL (ref 3.5–5.0)
Alkaline Phosphatase: 66 U/L (ref 38–126)
Anion gap: 6 (ref 5–15)
BUN: 31 mg/dL — ABNORMAL HIGH (ref 8–23)
CO2: 25 mmol/L (ref 22–32)
Calcium: 8.6 mg/dL — ABNORMAL LOW (ref 8.9–10.3)
Chloride: 108 mmol/L (ref 98–111)
Creatinine, Ser: 0.87 mg/dL (ref 0.44–1.00)
GFR calc Af Amer: >60 mL/min (ref >60)
GFR calc non Af Amer: >60 mL/min (ref >60)
Glucose, Bld: 171 mg/dL — ABNORMAL HIGH (ref 70–99)
Potassium: 4.2 mmol/L (ref 3.5–5.1)
Sodium: 139 mmol/L (ref 135–145)
Total Bilirubin: 0.6 mg/dL (ref 0.3–1.2)
Total Protein: 6.7 g/dL (ref 6.5–8.1)

## 2020-05-20 LAB — GLUCOSE, CAPILLARY: Glucose-Capillary: 89 mg/dL (ref 70–99)

## 2020-05-20 LAB — PROTIME-INR
INR: 1 (ref 0.8–1.2)
Prothrombin Time: 13.2 seconds (ref 11.4–15.2)

## 2020-05-20 LAB — SARS CORONAVIRUS 2 BY RT PCR (HOSPITAL ORDER, PERFORMED IN ~~LOC~~ HOSPITAL LAB): SARS Coronavirus 2: NEGATIVE

## 2020-05-20 LAB — APTT: aPTT: 34 seconds (ref 24–36)

## 2020-05-20 MED ORDER — ALBUTEROL SULFATE (2.5 MG/3ML) 0.083% IN NEBU: 2.5000 mg | INHALATION_SOLUTION | RESPIRATORY_TRACT | Status: DC | PRN

## 2020-05-20 MED ORDER — TIOTROPIUM BROMIDE MONOHYDRATE 18 MCG IN CAPS
18.0000 ug | ORAL_CAPSULE | Freq: Every day | RESPIRATORY_TRACT | Status: DC
  Administered 2020-05-21 – 2020-05-23 (×3): 18 ug via RESPIRATORY_TRACT
  Filled 2020-05-20: qty 5

## 2020-05-20 MED ORDER — SODIUM CHLORIDE 0.9 % IV SOLN
8.0000 mg/h | INTRAVENOUS | Status: DC
  Administered 2020-05-20 – 2020-05-22 (×4): 8 mg/h via INTRAVENOUS
  Filled 2020-05-20 (×5): qty 80

## 2020-05-20 MED ORDER — INSULIN ASPART 100 UNIT/ML ~~LOC~~ SOLN
0.0000 [IU] | SUBCUTANEOUS | Status: DC
  Administered 2020-05-21 – 2020-05-22 (×2): 2 [IU] via SUBCUTANEOUS
  Filled 2020-05-20 (×2): qty 1

## 2020-05-20 MED ORDER — HYDRALAZINE HCL 20 MG/ML IJ SOLN: 5.0000 mg | INTRAMUSCULAR | Status: DC | PRN

## 2020-05-20 MED ORDER — TRAMADOL HCL 50 MG PO TABS: 50.0000 mg | ORAL_TABLET | Freq: Four times a day (QID) | ORAL | Status: DC | PRN

## 2020-05-20 MED ORDER — INSULIN ASPART PROT & ASPART (70-30 MIX) 100 UNIT/ML ~~LOC~~ SUSP
60.0000 [IU] | Freq: Every day | SUBCUTANEOUS | Status: DC
  Administered 2020-05-23: 60 [IU] via SUBCUTANEOUS
  Filled 2020-05-20: qty 10

## 2020-05-20 MED ORDER — METOPROLOL TARTRATE 50 MG PO TABS
100.0000 mg | ORAL_TABLET | Freq: Two times a day (BID) | ORAL | Status: DC
  Administered 2020-05-20 – 2020-05-23 (×4): 100 mg via ORAL
  Filled 2020-05-20 (×5): qty 2

## 2020-05-20 MED ORDER — LISINOPRIL 10 MG PO TABS
10.0000 mg | ORAL_TABLET | Freq: Every day | ORAL | Status: DC
  Administered 2020-05-21 – 2020-05-23 (×2): 10 mg via ORAL
  Filled 2020-05-20 (×3): qty 1

## 2020-05-20 MED ORDER — INSULIN ASPART PROT & ASPART (70-30 MIX) 100 UNIT/ML ~~LOC~~ SUSP: 40.0000 [IU] | Freq: Two times a day (BID) | SUBCUTANEOUS | Status: DC

## 2020-05-20 MED ORDER — AMITRIPTYLINE HCL 25 MG PO TABS: 25.0000 mg | ORAL_TABLET | Freq: Every evening | ORAL | Status: DC | PRN

## 2020-05-20 MED ORDER — ISOSORBIDE MONONITRATE ER 30 MG PO TB24
30.0000 mg | ORAL_TABLET | Freq: Every day | ORAL | Status: DC
  Administered 2020-05-21 – 2020-05-23 (×2): 30 mg via ORAL
  Filled 2020-05-20 (×3): qty 1

## 2020-05-20 MED ORDER — INSULIN ASPART PROT & ASPART (70-30 MIX) 100 UNIT/ML ~~LOC~~ SUSP
40.0000 [IU] | Freq: Every day | SUBCUTANEOUS | Status: DC
  Administered 2020-05-21 – 2020-05-22 (×2): 40 [IU] via SUBCUTANEOUS
  Filled 2020-05-20 (×2): qty 10

## 2020-05-20 MED ORDER — GABAPENTIN 300 MG PO CAPS
300.0000 mg | ORAL_CAPSULE | Freq: Two times a day (BID) | ORAL | Status: DC
  Administered 2020-05-20 – 2020-05-23 (×5): 300 mg via ORAL
  Filled 2020-05-20 (×5): qty 1

## 2020-05-20 MED ORDER — ALBUTEROL SULFATE HFA 108 (90 BASE) MCG/ACT IN AERS: 2.0000 | INHALATION_SPRAY | RESPIRATORY_TRACT | Status: DC | PRN

## 2020-05-20 MED ORDER — ONDANSETRON HCL 4 MG/2ML IJ SOLN: 4.0000 mg | Freq: Three times a day (TID) | INTRAMUSCULAR | Status: DC | PRN

## 2020-05-20 MED ORDER — SODIUM CHLORIDE 0.9 % IV SOLN: INTRAVENOUS | Status: DC

## 2020-05-20 MED ORDER — SODIUM CHLORIDE 0.9 % IV SOLN
80.0000 mg | Freq: Once | INTRAVENOUS | Status: AC
  Administered 2020-05-20: 80 mg via INTRAVENOUS
  Filled 2020-05-20: qty 80

## 2020-05-20 MED ORDER — PRAVASTATIN SODIUM 20 MG PO TABS
20.0000 mg | ORAL_TABLET | Freq: Every day | ORAL | Status: DC
  Administered 2020-05-20 – 2020-05-22 (×3): 20 mg via ORAL
  Filled 2020-05-20 (×3): qty 1

## 2020-05-20 NOTE — Consult Note (Signed)
Joyce Potter , MD [address] [place], [place], [place], [id number] [place], [place], [place], [address] Phone: [phone]  Fax: [phone]  Consultation  Referring Provider:     Dr Blaine Hamper  Primary Care Physician:  [name], MD Primary Gastroenterologist:  Dr. Bonna Gains      Reason for Consultation:     GI bleed- melena   Date of Admission:  05/20/2020 Date of Consultation:  05/20/2020         HPI:   Joyce Potter is a 74 y.o. female previously seen by Dr. Bonna Gains at our clinic.  History of iron deficiency anemia evaluated by Dr. Vira Agar in 2019.  EGD showed a hiatal hernia and gastritis.  Capsule study of small bowel was normal.  Colonoscopy showed a diminutive polyp and internal hemorrhoids.  She has a history of aortic stenosis hypertension recently commenced on Plavix and aspirin after cardiac catheterization on 05/15/2020.  Underwent PCI with drug-eluting stent in the proximal RCA.  Discharged home on 05/16/2020.  Noticed some tarry black stools day before yesterday , no hematemesis, no nsaid use, not taken Asprin since her heart cath, last dose of Plavix was yesterday.,  On admission hemoglobin 8.6 g with an MCV of 84.7.  Hemoglobin was 10.7 g 4 days back.  Creatinine was 0.87.  BUN was elevated at 31.  In March 2021 iron studies and ferritin were normal.    Past Medical History:  Diagnosis Date  . Anemia    iron deficiency  . Aortic stenosis   . Basal cell carcinoma   . CAD (coronary artery disease)    s/p Left circumflex stent in 2012  . Carotid stenosis   . Cataract   . High cholesterol   . HTN (hypertension)   . Hyperlipidemia   . IDDM (insulin dependent diabetes mellitus)   . Multiple thyroid nodules    Benign  . Peptic ulcer disease   . Retinal artery occlusion    on left    Past Surgical History:  Procedure Laterality Date  . ABDOMINAL HYSTERECTOMY    . ARTERY BIOPSY Left 11/19/2015   Procedure: BIOPSY TEMPORAL ARTERY;   Surgeon: Algernon Huxley, MD;  Location: [facility];  Service: Vascular;  Laterality: Left;  . BREAST BIOPSY Left 2002   core- neg  . BREAST EXCISIONAL BIOPSY    . CARDIAC CATHETERIZATION N/A 08/08/2015   Procedure: Right and Left Heart Cath and Coronary Angiography;  Surgeon: Teodoro Spray, MD;  Location: [place] [facility];  Service: Cardiovascular;  Laterality: N/A;  . CARDIAC CATHETERIZATION Bilateral 09/03/2016   Procedure: Right/Left Heart Cath and Coronary Angiography;  Surgeon: Teodoro Spray, MD;  Location: [facility];  Service: Cardiovascular;  Laterality: Bilateral;  . CATARACT EXTRACTION W/ INTRAOCULAR LENS  IMPLANT, BILATERAL    . COLONOSCOPY     in 2013- internal hemorrhoids  . COLONOSCOPY WITH PROPOFOL N/A 07/07/2018   Procedure: COLONOSCOPY WITH PROPOFOL;  Surgeon: Manya Silvas, MD;  Location: [facility];  Service: Endoscopy;  Laterality: N/A;  . CORONARY ANGIOGRAPHY N/A 09/14/2017   Procedure: CORONARY ANGIOGRAPHY;  Surgeon: Teodoro Spray, MD;  Location: [facility];  Service: Cardiovascular;  Laterality: N/A;  . CORONARY ANGIOPLASTY    . CORONARY STENT INTERVENTION N/A 05/15/2020   Procedure: coronary angiography;  Surgeon: Teodoro Spray, MD;  Location: [place] [facility];  Service: Cardiovascular;  Laterality: N/A;  . CORONARY STENT INTERVENTION N/A 05/15/2020  Procedure: CORONARY STENT INTERVENTION;  Surgeon: Isaias Cowman, MD;  Location: [place] [facility];  Service: Cardiovascular;  Laterality: N/A;  . CORONARY STENT PLACEMENT    . ENDARTERECTOMY Left 10/01/2015   Procedure: ENDARTERECTOMY CAROTID;  Surgeon: Algernon Huxley, MD;  Location: [facility];  Service: Vascular;  Laterality: Left;  . ESOPHAGOGASTRODUODENOSCOPY    . ESOPHAGOGASTRODUODENOSCOPY (EGD) WITH PROPOFOL N/A 05/31/2016   Procedure: ESOPHAGOGASTRODUODENOSCOPY (EGD) WITH PROPOFOL;  Surgeon: Manya Silvas, MD;  Location: [facility] END[facility]COPY;  Service: Endoscopy;  Laterality: N/A;  .  ESOPHAGOGASTRODUODENOSCOPY (EGD) WITH PROPOFOL N/A 07/07/2018   Procedure: ESOPHAGOGASTRODUODENOSCOPY (EGD) WITH PROPOFOL;  Surgeon: Manya Silvas, MD;  Location: [facility] ENDOSCOPY;  Service: Endoscopy;  Laterality: N/A;  . ESOPHAGOGASTRODUODENOSCOPY (EGD) WITH PROPOFOL N/A 07/17/2018   Procedure: ESOPHAGOGASTRODUODENOSCOPY (EGD) WITH PROPOFOL;  Surgeon: Lucilla Lame, MD;  Location: [facility] END[facility]COPY;  Service: Endoscopy;  Laterality: N/A;  . EYE SURGERY    . GIVENS CAPSULE STUDY N/A 04/17/2013   Procedure: GIVENS CAPSULE STUDY;  Surgeon: Arta Silence, MD;  Location: [facility] ENDOSCOPY;  Service: Endoscopy;  Laterality: N/A;  patient ate breakfast at 7am   . HEMORRHOID BANDING  07/27/2016   Dr Viona Gilmore. Tamala Julian  . PARTIAL HYSTERECTOMY    . RIGHT AND LEFT HEART CATH Bilateral 09/14/2017   Procedure: RIGHT AND LEFT HEART CATH;  Surgeon: Teodoro Spray, MD;  Location: [place] [facility];  Service: Cardiovascular;  Laterality: Bilateral;  . RIGHT/LEFT HEART CATH AND CORONARY ANGIOGRAPHY Right 05/15/2020   Procedure: Right/Left Heart cath;  Surgeon: Teodoro Spray, MD;  Location: [place] [facility];  Service: Cardiovascular;  Laterality: Right;  . TRIGGER FINGER RELEASE      Prior to Admission medications   Medication Sig Start Date End Date Taking? Authorizing Provider  alendronate (FOSAMAX) 70 MG tablet Take 70 mg by mouth once a week.    Yes [provider]  clopidogrel (PLAVIX) 75 MG tablet Take 1 tablet (75 mg total) by mouth daily with breakfast. 05/16/20  Yes [name], [name], MD  gabapentin (NEURONTIN) 300 MG capsule Take 300 mg by mouth 2 (two) times daily.  04/30/19  Yes [provider]  insulin NPH-regular Human (NOVOLIN 70/30) (70-30) 100 UNIT/ML injection Inject 60-80 Units into the skin See admin instructions. Inject 80 units subcutaneously every morning and 60 units every afternoon   Yes [provider]  isosorbide mononitrate (IMDUR) 30 MG 24 hr tablet Take 30 mg by  mouth daily.   Yes [provider]  lisinopril (ZESTRIL) 10 MG tablet Take 10 mg by mouth daily. 12/18/19  Yes [provider]  metoprolol tartrate (LOPRESSOR) 100 MG tablet Take 100 mg by mouth 2 (two) times daily.  10/02/18  Yes [provider]  pantoprazole (PROTONIX) 40 MG tablet Take 40 mg by mouth daily. 05/17/20  Yes [provider]  pravastatin (PRAVACHOL) 20 MG tablet Take 20 mg by mouth at bedtime. 05/01/20  Yes [provider]  tiotropium (SPIRIVA HANDIHALER) 18 MCG inhalation capsule Place 18 mcg into inhaler and inhale daily.  09/22/17  Yes [provider]  TRULICITY 1.5 0000000 SOPN Inject 0.5 mLs into the skin once a week. 03/21/20  Yes [provider]  albuterol (PROAIR HFA) 108 (90 Base) MCG/ACT inhaler Inhale 2 puffs into the lungs every 4 (four) hours as needed for wheezing or shortness of breath.     [provider]  amitriptyline (ELAVIL) 25 MG tablet Take 25 mg by mouth at bedtime as needed for sleep.  [provider]  aspirin 81 MG chewable tablet Chew 1 tablet (81 mg total) by mouth daily. Patient not taking: Reported on 05/20/2020 05/16/20   Isaias Cowman, MD  diclofenac Sodium (VOLTAREN) 1 % GEL Apply 2 g topically 4 (four) times daily as needed (pain).    [provider]  Insulin Syringe-Needle U-100 (INSULIN SYRINGE 1CC/30GX5/16") 30G X 5/16" 1 ML MISC USE ONE TWICE DAILY WITH INSULIN 03/18/17   [provider]  traMADol (ULTRAM) 50 MG tablet Take 50 mg by mouth every 6 (six) hours as needed for moderate pain.    [provider]    Family History  Problem Relation Age of Onset  . Uterine cancer Mother   . Breast cancer Mother 89  . Seizures Father   . Stroke Father   . Diabetes Father   . COPD Father   . Colon cancer Neg Hx      Social History   Tobacco Use  . Smoking status: Former Smoker    Quit date: 02/15/1985    Years since quitting: 35.2  .  Smokeless tobacco: Never Used  . Tobacco comment: quit about 31 years ago  Substance Use Topics  . Alcohol use: No    Alcohol/week: 0.0 standard drinks  . Drug use: No    Allergies as of 05/20/2020 - Review Complete 05/20/2020  Allergen Reaction Noted  . Invokamet [canagliflozin-metformin hcl] Other (See Comments) 08/08/2015  . Sulfa antibiotics Other (See Comments) 04/13/2013  . Aspirin Other (See Comments) 09/28/2015  . Lovastatin Rash 08/08/2015  . Penicillins Rash 04/13/2013  . Vicodin [hydrocodone-acetaminophen] Rash 04/13/2013    Review of Systems:    All systems reviewed and negative except where noted in HPI.   Physical Exam:  Vital signs in last 24 hours: Temp:  [98.4 F (36.9 C)] 98.4 F (36.9 C) (05/25 1048) Pulse Rate:  [61-68] 61 (05/25 1330) Resp:  [15-20] 17 (05/25 1330) BP: (125-137)/(45-73) 137/59 (05/25 1330) SpO2:  [95 %-99 %] 95 % (05/25 1330) Weight:  [91.6 kg] 91.6 kg (05/25 1045)   General:   Pleasant, cooperative in NAD Head:  Normocephalic and atraumatic. Eyes:   No icterus.   Conjunctiva pink. PERRLA. Ears:  Normal auditory acuity. Neck:  Supple; no masses or thyroidomegaly Lungs: Respirations even and unlabored. Lungs clear to auscultation bilaterally.   No wheezes, crackles, or rhonchi.  Heart:  Regular rate and rhythm; systolic murmur , no  clicks, rubs or gallops Abdomen:  Soft, nondistended, nontender. Normal bowel sounds. No appreciable masses or hepatomegaly.  No rebound or guarding.  Neurologic:  Alert and oriented x3;  grossly normal neurologically. Skin:  Intact without significant lesions or rashes. Cervical Nodes:  No significant cervical adenopathy. Psych:  Alert and cooperative. Normal affect.  LAB RESULTS: Recent Labs    05/20/20 1054 05/20/20 1348  WBC 4.6 4.9  HGB 8.6* 8.6*  HCT 27.1* 26.8*  PLT 255 248   BMET Recent Labs    05/20/20 1054  NA 139  K 4.2  CL 108  CO2 25  GLUCOSE 171*  BUN 31*  CREATININE 0.87    CALCIUM 8.6*   LFT Recent Labs    05/20/20 1054  PROT 6.7  ALBUMIN 3.6  AST 10*  ALT 19  ALKPHOS 66  BILITOT 0.6   PT/INR No results for input(s): LABPROT, INR in the last 72 hours.  STUDIES: No results found.    Impression / Plan:   Joyce Potter is a 74 y.o. y/o  female with a history of aortic stenosis and anemia due to iron deficiency worked up in 2019 by Dr. Vira Agar with no clear etiology found presents to the emergency room with melena.  She underwent cardiac catheterization less than a week back and had a drug-eluting stent placed in her RCA.  She has been on aspirin and Plavix.  She does have a hiatal hernia.  Drop in hemoglobin of over 2 g with elevated BUN/creatinine ratio suggestive of an upper GI bleed.  Plan 1.  Monitor CBC and transfuse as needed. 2.  IV PPI gtt., H. pylori stool antigen, recheck iron studies, B12, folate 3.  Cardiac clearance for EGD.  Per Dr Blaine Hamper discussed with cardiology and OK to hold asprin and plavix today and will perform EGD tomorrow .    I have discussed alternative options, risks & benefits,  which include, but are not limited to, bleeding, infection, perforation,respiratory complication & drug reaction.  The patient agrees with this plan & written consent will be obtained.     Thank you for involving me in the care of this patient.      LOS: 0 days   Joyce Bellows, MD  05/20/2020, 2:21 PM

## 2020-05-20 NOTE — ED Triage Notes (Signed)
Pt reports had blood work done and her MD called and said her hbg was low and to come to the ED.

## 2020-05-20 NOTE — ED Provider Notes (Addendum)
[facility] Emergency Department Provider Note  ____________________________________________   First MD Initiated Contact with Patient 05/20/20 1150     (approximate)  I have reviewed the triage vital signs and the nursing notes.  HISTORY  Chief Complaint Abdominal Pain and Melena    HPI Joyce Potter is a 74 y.o. female with history of aortic stenosis, hypertension, hyperlipidemia, coronary disease status post recent stenting, here with melena and weakness.  The patient was just recently placed back on Plavix and aspirin.  She has been taking the Plavix but not aspirin.  She states that over the last several days, she has had increasingly dark, black, tarry, foul-smelling stools along with general fatigue.  She has felt some lightheadedness with standing.  She denies any chest pain.  No syncope.  Symptoms feel similar to her previous episodes of upper GI bleed.  Denies any other acute complaints.  She has been taking Protonix, but did go several days without it because they would not fill it at her pharmacy.  Denies any active abdominal pain.  She does have some mildly decreased appetite.  No other complaints.        Past Medical History:  Diagnosis Date  . Anemia    iron deficiency  . Aortic stenosis   . Basal cell carcinoma   . CAD (coronary artery disease)    s/p Left circumflex stent in 2012  . Carotid stenosis   . Cataract   . High cholesterol   . HTN (hypertension)   . Hyperlipidemia   . IDDM (insulin dependent diabetes mellitus)   . Multiple thyroid nodules    Benign  . Peptic ulcer disease   . Retinal artery occlusion    on left    Patient Active Problem List   Diagnosis Date Noted  . Diabetes mellitus without complication ([place]) [id number]  . Anemia due to blood loss 05/20/2020  . Chronic fatigue 03/21/2020  . Diabetes mellitus type 2 in obese ([place]) 03/21/2020  . Obesity, Class II, BMI 35-39.9 03/21/2020  . Snoring 03/21/2020  .  Background diabetic retinopathy associated with type 2 diabetes mellitus ([place]) 06/06/2019  . Uncontrolled type 2 diabetes mellitus with hyperglycemia ([place]) 04/30/2019  . Osteoporosis, post-menopausal 04/30/2019  . Multiple rib fractures 10/11/2018  . GIB (gastrointestinal bleeding) 07/17/2018  . Melena   . Symptomatic anemia   . PVD (peripheral vascular disease) ([place]) 02/11/2017  . Iron deficiency anemia due to chronic blood loss 07/13/2016  . Multinodular goiter 01/25/2016  . Central retinal artery occlusion 10/01/2015  . Carotid stenosis 10/01/2015  . Stroke ([place]) 09/28/2015  . Aortic stenosis, moderate 11/29/2014  . PUD (peptic ulcer disease) 07/17/2014  . HLD (hyperlipidemia) 05/07/2013  . GI bleed from an occult source with recurrent chronic blood loss anemia 04/14/2013  . CAD (coronary artery disease) 04/14/2013  . IDDM (insulin dependent diabetes mellitus) 04/14/2013  . HTN (hypertension) 04/14/2013  . Chest pain 04/14/2013    Past Surgical History:  Procedure Laterality Date  . ABDOMINAL HYSTERECTOMY    . ARTERY BIOPSY Left 11/19/2015   Procedure: BIOPSY TEMPORAL ARTERY;  Surgeon: Algernon Huxley, MD;  Location: [facility] ORS;  Service: Vascular;  Laterality: Left;  . BREAST BIOPSY Left 2002   core- neg  . BREAST EXCISIONAL BIOPSY    . CARDIAC CATHETERIZATION N/A 08/08/2015   Procedure: Right and Left Heart Cath and Coronary Angiography;  Surgeon: Teodoro Spray, MD;  Location: [place] [facility];  Service: Cardiovascular;  Laterality: N/A;  .  CARDIAC CATHETERIZATION Bilateral 09/03/2016   Procedure: Right/Left Heart Cath and Coronary Angiography;  Surgeon: Teodoro Spray, MD;  Location: [place] [facility];  Service: Cardiovascular;  Laterality: Bilateral;  . CATARACT EXTRACTION W/ INTRAOCULAR LENS  IMPLANT, BILATERAL    . COLONOSCOPY     in 2013- internal hemorrhoids  . COLONOSCOPY WITH PROPOFOL N/A 07/07/2018   Procedure: COLONOSCOPY WITH PROPOFOL;  Surgeon: Manya Silvas,  MD;  Location: [facility] ENDOSCOPY;  Service: Endoscopy;  Laterality: N/A;  . CORONARY ANGIOGRAPHY N/A 09/14/2017   Procedure: CORONARY ANGIOGRAPHY;  Surgeon: Teodoro Spray, MD;  Location: [place] [facility];  Service: Cardiovascular;  Laterality: N/A;  . CORONARY ANGIOPLASTY    . CORONARY STENT INTERVENTION N/A 05/15/2020   Procedure: coronary angiography;  Surgeon: Teodoro Spray, MD;  Location: [place] [facility];  Service: Cardiovascular;  Laterality: N/A;  . CORONARY STENT INTERVENTION N/A 05/15/2020   Procedure: CORONARY STENT INTERVENTION;  Surgeon: Isaias Cowman, MD;  Location: [place] [facility];  Service: Cardiovascular;  Laterality: N/A;  . CORONARY STENT PLACEMENT    . ENDARTERECTOMY Left 10/01/2015   Procedure: ENDARTERECTOMY CAROTID;  Surgeon: Algernon Huxley, MD;  Location: [facility] ORS;  Service: Vascular;  Laterality: Left;  . ESOPHAGOGASTRODUODENOSCOPY    . ESOPHAGOGASTRODUODENOSCOPY (EGD) WITH PROPOFOL N/A 05/31/2016   Procedure: ESOPHAGOGASTRODUODENOSCOPY (EGD) WITH PROPOFOL;  Surgeon: Manya Silvas, MD;  Location[facility] ENDOSCOPY;  Service: Endoscopy;  Laterality: N/A;  . ESOPHAGOGASTRODUODENOSCOPY (EGD) WITH PROPOFOL N/A 07/07/2018   Procedure: ESOPHAGOGASTRODUODENOSCOPY (EGD) WITH PROPOFOL;  Surgeon: Manya Silvas, MD;  Location: [facility] ENDOSCOPY;  Service: Endoscopy;  Laterality: N/A;  . ESOPHAGOGASTRODUODENOSCOPY (EGD) WITH PROPOFOL N/A 07/17/2018   Procedure: ESOPHAGOGASTRODUODENOSCOPY (EGD) WITH PROPOFOL;  Surgeon: Lucilla Lame, MD;  Location: [facility] ENDOSCOPY;  Service: Endoscopy;  Laterality: N/A;  . EYE SURGERY    . GIVENS CAPSULE STUDY N/A 04/17/2013   Procedure: GIVENS CAPSULE STUDY;  Surgeon: Arta Silence, MD;  Location[facility] ENDOSCOPY;  Service: Endoscopy;  Laterality: N/A;  patient ate breakfast at 7am   . HEMORRHOID BANDING  07/27/2016   Dr Viona Gilmore. Tamala Julian  . PARTIAL HYSTERECTOMY    . RIGHT AND LEFT HEART CATH Bilateral 09/14/2017   Procedure: RIGHT AND LEFT HEART  CATH;  Surgeon: Teodoro Spray, MD;  Location: [place] [facility];  Service: Cardiovascular;  Laterality: Bilateral;  . RIGHT/LEFT HEART CATH AND CORONARY ANGIOGRAPHY Right 05/15/2020   Procedure: Right/Left Heart cath;  Surgeon: Teodoro Spray, MD;  Location: [place] [facility] LAB;  Service: Cardiovascular;  Laterality: Right;  . TRIGGER FINGER RELEASE      Prior to Admission medications   Medication Sig Start Date End Date Taking? Authorizing Provider  albuterol (PROAIR HFA) 108 (90 Base) MCG/ACT inhaler Inhale 2 puffs into the lungs every 4 (four) hours as needed for wheezing or shortness of breath.     [provider]  alendronate (FOSAMAX) 70 MG tablet Take 70 mg by mouth once a week.     [provider]  amitriptyline (ELAVIL) 25 MG tablet Take 25 mg by mouth at bedtime as needed for sleep.    [provider]  aspirin 81 MG chewable tablet Chew 1 tablet (81 mg total) by mouth daily. 05/16/20   [name], [name], MD  clopidogrel (PLAVIX) 75 MG tablet Take 1 tablet (75 mg total) by mouth daily with breakfast. 05/16/20   [name], Sheppard Coil, MD  diclofenac Sodium (VOLTAREN) 1 % GEL Apply 2 g topically 4 (four) times daily as  needed (pain).    [provider]  gabapentin (NEURONTIN) 300 MG capsule Take 300 mg by mouth 2 (two) times daily.  04/30/19   [provider]  insulin NPH-regular Human (NOVOLIN 70/30) (70-30) 100 UNIT/ML injection Inject 60-80 Units into the skin See admin instructions. Inject 80 units subcutaneously every morning and 60 units every afternoon    [provider]  Insulin Syringe-Needle U-100 (INSULIN SYRINGE 1CC/30GX5/16") 30G X 5/16" 1 ML MISC USE ONE TWICE DAILY WITH INSULIN 03/18/17   [provider]  isosorbide mononitrate (IMDUR) 30 MG 24 hr tablet Take 30 mg by mouth daily.    [provider]  lisinopril (ZESTRIL) 10 MG tablet Take 10 mg by mouth daily. 12/18/19   [provider]    metoprolol tartrate (LOPRESSOR) 100 MG tablet Take 100 mg by mouth 2 (two) times daily.  10/02/18   [provider]  omeprazole (PRILOSEC) 40 MG capsule Take 40 mg by mouth 2 (two) times daily.    [provider]  pravastatin (PRAVACHOL) 20 MG tablet Take 20 mg by mouth at bedtime. 05/01/20   [provider]  tiotropium (SPIRIVA HANDIHALER) 18 MCG inhalation capsule Place 18 mcg into inhaler and inhale daily.  09/22/17   [provider]  traMADol (ULTRAM) 50 MG tablet Take 50 mg by mouth every 6 (six) hours as needed for moderate pain.    [provider]  TRULICITY 1.5 0000000 SOPN Inject 0.5 mLs into the skin once a week. 03/21/20   [provider]    Allergies Aspirin, Invokamet [canagliflozin-metformin hcl], Sulfa antibiotics, Lovastatin, Penicillins, and Vicodin [hydrocodone-acetaminophen]  Family History  Problem Relation Age of Onset  . Uterine cancer Mother   . Breast cancer Mother 23  . Seizures Father   . Stroke Father   . Diabetes Father   . COPD Father   . Colon cancer Neg Hx     Social History Social History   Tobacco Use  . Smoking status: Former Smoker    Quit date: 02/15/1985    Years since quitting: 35.2  . Smokeless tobacco: Never Used  . Tobacco comment: quit about 31 years ago  Substance Use Topics  . Alcohol use: No    Alcohol/week: 0.0 standard drinks  . Drug use: No    Review of Systems  Review of Systems  Constitutional: Positive for fatigue. Negative for fever.  HENT: Negative for congestion and sore throat.   Eyes: Negative for visual disturbance.  Respiratory: Negative for cough and shortness of breath.   Cardiovascular: Negative for chest pain.  Gastrointestinal: Positive for abdominal pain, nausea and vomiting. Negative for diarrhea.  Genitourinary: Negative for flank pain.  Musculoskeletal: Negative for back pain and neck pain.  Skin: Negative for rash and wound.  Neurological: Positive for  weakness.  All other systems reviewed and are negative.    ____________________________________________  PHYSICAL EXAM:      VITAL SIGNS: ED Triage Vitals  Enc Vitals Group     BP 05/20/20 1048 (!) 125/45     Pulse Rate 05/20/20 1048 68     Resp 05/20/20 1048 20     Temp 05/20/20 1048 98.4 F (36.9 C)     Temp Source 05/20/20 1048 Oral     SpO2 05/20/20 1048 95 %     Weight 05/20/20 1045 202 lb (91.6 kg)     Height 05/20/20 1045 5\' 3"  (1.6 m)     Head Circumference --  Peak Flow --      Pain Score 05/20/20 1044 4     Pain Loc --      Pain Edu? --      Excl. in [place]? --      Physical Exam Vitals and nursing note reviewed.  Constitutional:      General: She is not in acute distress.    Appearance: She is well-developed.  HENT:     Head: Normocephalic and atraumatic.  Eyes:     Conjunctiva/sclera: Conjunctivae normal.  Cardiovascular:     Rate and Rhythm: Normal rate and regular rhythm.     Heart sounds: Normal heart sounds. No murmur. No friction rub.  Pulmonary:     Effort: Pulmonary effort is normal. No respiratory distress.     Breath sounds: Normal breath sounds. No wheezing or rales.  Abdominal:     General: There is no distension.     Palpations: Abdomen is soft.     Tenderness: There is abdominal tenderness (mild) in the epigastric area.  Musculoskeletal:     Cervical back: Neck supple.  Skin:    General: Skin is warm.     Capillary Refill: Capillary refill takes less than 2 seconds.     Findings: No rash.  Neurological:     Mental Status: She is alert and oriented to person, place, and time.     Motor: No abnormal muscle tone.       ____________________________________________   LABS (all labs ordered are listed, but only abnormal results are displayed)  Labs Reviewed  COMPREHENSIVE METABOLIC PANEL - Abnormal; Notable for the following components:      Result Value   Glucose, Bld 171 (*)    BUN 31 (*)    Calcium 8.6 (*)    AST 10 (*)     All other components within normal limits  CBC - Abnormal; Notable for the following components:   RBC 3.20 (*)    Hemoglobin 8.6 (*)    HCT 27.1 (*)    All other components within normal limits  SARS CORONAVIRUS 2 BY RT PCR (HOSPITAL ORDER, [place] [facility])  CBC  CBC  CBC  PROTIME-INR  APTT  TYPE AND SCREEN    ____________________________________________  EKG: None ________________________________________  RADIOLOGY All imaging, including plain films, CT scans, and ultrasounds, independently reviewed by me, and interpretations confirmed via formal radiology reads.  ED MD interpretation:   None  Official radiology report(s): No results found.  ____________________________________________  PROCEDURES   Procedure(s) performed (including Critical Care):  .1-3 Lead EKG Interpretation Performed by: Duffy Bruce, MD Authorized by: Duffy Bruce, MD     Interpretation: non-specific     ECG rate:  60-70   ECG rate assessment: normal     Rhythm: sinus rhythm     Ectopy: none     Conduction: normal   Comments:     Indication: upper GI bleed    ____________________________________________  INITIAL IMPRESSION / MDM / ASSESSMENT AND PLAN / ED COURSE  As part of my medical decision making, I reviewed the following data within the [place] notes reviewed and incorporated, Old chart reviewed, Notes from prior ED visits, and [place] Controlled Substance Database       *Joyce Potter was evaluated in Emergency Department on 05/20/2020 for the symptoms described in the history of present illness. She was evaluated in the context of the global COVID-19 pandemic, which necessitated consideration that the patient might  be at risk for infection with the SARS-CoV-2 virus that causes COVID-19. Institutional protocols and algorithms that pertain to the evaluation of patients at risk for COVID-19 are in a state of rapid change based  on information released by regulatory bodies including the CDC and federal and state organizations. These policies and algorithms were followed during the patient's care in the ED.  Some ED evaluations and interventions may be delayed as a result of limited staffing during the pandemic.*     Medical Decision Making: 74 year old female with history of recurrent GI bleeds here with generalized weakness in the setting of recently restarting her aspirin and Plavix.  Suspect recurrent upper GI bleed with melena.  Her hemoglobin has dropped approximately 2 points compared to her prior value.  She has an elevated BUN to creatinine ratio concerning for possible upper source.  Will start on Protonix drip, type and screen, and plan to admit with likely consultation with GI.  She will likely need to continue her Plavix given recent stenting.  She appears hemodynamically stable without active hemorrhage on exam here.  Vital signs stable.  ____________________________________________  FINAL CLINICAL IMPRESSION(S) / ED DIAGNOSES  Final diagnoses:  Melena  Blood loss anemia     MEDICATIONS GIVEN DURING THIS VISIT:  Medications  pantoprazole (PROTONIX) 80 mg in sodium chloride 0.9 % 100 mL IVPB (80 mg Intravenous New Bag/Given 05/20/20 1403)  pantoprazole (PROTONIX) 80 mg in sodium chloride 0.9 % 100 mL (0.8 mg/mL) infusion (has no administration in time range)  0.9 %  sodium chloride infusion (has no administration in time range)  ondansetron (ZOFRAN) injection 4 mg (has no administration in time range)  albuterol (VENTOLIN HFA) 108 (90 Base) MCG/ACT inhaler 2 puff (has no administration in time range)     ED Discharge Orders    None       Note:  This document was prepared using Dragon voice recognition software and may include unintentional dictation errors.   Duffy Bruce, MD 05/20/20 [id number]    Duffy Bruce, MD 05/20/20 [phone]

## 2020-05-20 NOTE — ED Triage Notes (Signed)
Pt arrived via POV with reports of low hemoglobin and black stools since  Yesterday.  Pt states she was DC'd from hospital on Saturday, had a heart cath done with stent during admission, her last hemoglobin before DC was 10, today when she had labs rechecked it was down to 8.  Dr. Ubaldo Glassing instructed patient to return to ED.  Pt states she has been having abdominal pain that feels like pins and needles.  Pt takes Plavix, not currently taking aspirin which she says she is supposed to take.  Pt also takes a protonix, was on omeprazole.

## 2020-05-20 NOTE — H&P (Signed)
History and Physical    Joyce Potter [id number] DOB: 10/07/1946 DOA: 05/20/2020  Referring MD/NP/PA:   PCP: [name], MD   Patient coming from:  The patient is coming from home.  At baseline, pt is independent for most of ADL.        Chief Complaint: dark stool  HPI: Joyce Potter is a 74 y.o. female with medical history significant of hypertension, hyperlipidemia, diabetes mellitus, stroke, GERD, PUD, GI bleeding, CAD, stent placement, aortic stenosis, anemia, carotid artery stenosis, PVD, depression, who presents with dark stool.  Patient was recently hospitalized from 5/20-5/21 and had cardiac cath, PCI with drug-eluting stent placement.  Patient is discharged on aspirin and Plavix.  Patient states that she is only taking Plavix, not aspirin currently.  She developed black stool, and was seen by her cardiologist, Dr. Ubaldo Glassing, found to have low hemoglobin, sent to ED for further evaluation treatment.  Patient has lightheadedness, but no chest pain, shortness of breath.  Denies nausea, vomiting, diarrhea or abdominal pain.  No symptoms of UTI or unilateral weakness.  ED Course: pt was found to have hemoglobin 8.6 (10.7 on 05/16/2020), pending COVID-19 PCR, electrolytes renal function okay, temperature normal, blood pressure 131/72, heart rate 64, RR 15, oxygen saturation 99% on room air.  Patient is placed on progressive bed for observation.  Dr. Vicente Males of GI is consulted.   Review of Systems:   General: no fevers, chills, no body weight gain, has fatigue HEENT: no blurry vision, hearing changes or sore throat Respiratory: no dyspnea, coughing, wheezing CV: no chest pain, no palpitations GI: no nausea, vomiting, abdominal pain, diarrhea, constipation.  Has dark stool GU: no dysuria, burning on urination, increased urinary frequency, hematuria  Ext: no leg edema Neuro: no unilateral weakness, numbness, or tingling, no vision change or hearing loss.  Has lightheadedness. Skin:  no rash, no skin tear. MSK: No muscle spasm, no deformity, no limitation of range of movement in spin Heme: No easy bruising.  Travel history: No recent long distant travel.  Allergy:  Allergies  Allergen Reactions  . Invokamet [Canagliflozin-Metformin Hcl] Other (See Comments)    Yeast infection  . Sulfa Antibiotics Other (See Comments)    "Got Drunk"  . Aspirin Other (See Comments)    ulcers  . Lovastatin Rash  . Penicillins Rash    Has patient had a PCN reaction causing immediate rash, facial/tongue/throat swelling, SOB or lightheadedness with hypotension:Yes Has patient had a PCN reaction causing severe rash involving mucus membranes or skin necrosis:all over body Has patient had a PCN reaction that required hospitalization: No Has patient had a PCN reaction occurring within the last 10 years: No If all of the above answers are "NO", then may proceed with Cephalosporin use.   . Vicodin [Hydrocodone-Acetaminophen] Rash    Past Medical History:  Diagnosis Date  . Anemia    iron deficiency  . Aortic stenosis   . Basal cell carcinoma   . CAD (coronary artery disease)    s/p Left circumflex stent in 2012  . Carotid stenosis   . Cataract   . High cholesterol   . HTN (hypertension)   . Hyperlipidemia   . IDDM (insulin dependent diabetes mellitus)   . Multiple thyroid nodules    Benign  . Peptic ulcer disease   . Retinal artery occlusion    on left    Past Surgical History:  Procedure Laterality Date  . ABDOMINAL HYSTERECTOMY    . ARTERY BIOPSY Left 11/19/2015  Procedure: BIOPSY TEMPORAL ARTERY;  Surgeon: Algernon Huxley, MD;  Location: [facility] ORS;  Service: Vascular;  Laterality: Left;  . BREAST BIOPSY Left 2002   core- neg  . BREAST EXCISIONAL BIOPSY    . CARDIAC CATHETERIZATION N/A 08/08/2015   Procedure: Right and Left Heart Cath and Coronary Angiography;  Surgeon: Teodoro Spray, MD;  Location: [place] [facility];  Service: Cardiovascular;  Laterality: N/A;  .  CARDIAC CATHETERIZATION Bilateral 09/03/2016   Procedure: Right/Left Heart Cath and Coronary Angiography;  Surgeon: Teodoro Spray, MD;  Location: [place] [facility];  Service: Cardiovascular;  Laterality: Bilateral;  . CATARACT EXTRACTION W/ INTRAOCULAR LENS  IMPLANT, BILATERAL    . COLONOSCOPY     in 2013- internal hemorrhoids  . COLONOSCOPY WITH PROPOFOL N/A 07/07/2018   Procedure: COLONOSCOPY WITH PROPOFOL;  Surgeon: Manya Silvas, MD;  Location: [facility] ENDOSCOPY;  Service: Endoscopy;  Laterality: N/A;  . CORONARY ANGIOGRAPHY N/A 09/14/2017   Procedure: CORONARY ANGIOGRAPHY;  Surgeon: Teodoro Spray, MD;  Location: [place] [facility];  Service: Cardiovascular;  Laterality: N/A;  . CORONARY ANGIOPLASTY    . CORONARY STENT INTERVENTION N/A 05/15/2020   Procedure: coronary angiography;  Surgeon: Teodoro Spray, MD;  Location: [place] [facility];  Service: Cardiovascular;  Laterality: N/A;  . CORONARY STENT INTERVENTION N/A 05/15/2020   Procedure: CORONARY STENT INTERVENTION;  Surgeon: Isaias Cowman, MD;  Location: [place] [facility];  Service: Cardiovascular;  Laterality: N/A;  . CORONARY STENT PLACEMENT    . ENDARTERECTOMY Left 10/01/2015   Procedure: ENDARTERECTOMY CAROTID;  Surgeon: Algernon Huxley, MD;  Location: [facility] ORS;  Service: Vascular;  Laterality: Left;  . ESOPHAGOGASTRODUODENOSCOPY    . ESOPHAGOGASTRODUODENOSCOPY (EGD) WITH PROPOFOL N/A 05/31/2016   Procedure: ESOPHAGOGASTRODUODENOSCOPY (EGD) WITH PROPOFOL;  Surgeon: Manya Silvas, MD;  Location: [facility]COPY;  Service: Endoscopy;  Laterality: N/A;  . ESOPHAGOGASTRODUODENOSCOPY (EGD) WITH PROPOFOL N/A 07/07/2018   Procedure: ESOPHAGOGASTRODUODENOSCOPY (EGD) WITH PROPOFOL;  Surgeon: Manya Silvas, MD;  Location: [facility]COPY;  Service: Endoscopy;  Laterality: N/A;  . ESOPHAGOGASTRODUODENOSCOPY (EGD) WITH PROPOFOL N/A 07/17/2018   Procedure: ESOPHAGOGASTRODUODENOSCOPY (EGD) WITH PROPOFOL;  Surgeon: Lucilla Lame, MD;   Location: [facility] ENDOSCOPY;  Service: Endoscopy;  Laterality: N/A;  . EYE SURGERY    . GIVENS CAPSULE STUDY N/A 04/17/2013   Procedure: GIVENS CAPSULE STUDY;  Surgeon: Arta Silence, MD;  Location: [facility] ENDOSCOPY;  Service: Endoscopy;  Laterality: N/A;  patient ate breakfast at 7am   . HEMORRHOID BANDING  07/27/2016   Dr Viona Gilmore. Tamala Julian  . PARTIAL HYSTERECTOMY    . RIGHT AND LEFT HEART CATH Bilateral 09/14/2017   Procedure: RIGHT AND LEFT HEART CATH;  Surgeon: Teodoro Spray, MD;  Location: [place] [facility];  Service: Cardiovascular;  Laterality: Bilateral;  . RIGHT/LEFT HEART CATH AND CORONARY ANGIOGRAPHY Right 05/15/2020   Procedure: Right/Left Heart cath;  Surgeon: Teodoro Spray, MD;  Location: [place] [facility];  Service: Cardiovascular;  Laterality: Right;  . TRIGGER FINGER RELEASE      Social History:  reports that she quit smoking about 35 years ago. She has never used smokeless tobacco. She reports that she does not drink alcohol or use drugs.  Family History:  Family History  Problem Relation Age of Onset  . Uterine cancer Mother   . Breast cancer Mother 105  . Seizures Father   . Stroke Father   . Diabetes Father   . COPD Father   . Colon cancer Neg Hx  Prior to Admission medications   Medication Sig Start Date End Date Taking? Authorizing Provider  albuterol (PROAIR HFA) 108 (90 Base) MCG/ACT inhaler Inhale 2 puffs into the lungs every 4 (four) hours as needed for wheezing or shortness of breath.     [provider]  alendronate (FOSAMAX) 70 MG tablet Take 70 mg by mouth once a week.     [provider]  amitriptyline (ELAVIL) 25 MG tablet Take 25 mg by mouth at bedtime as needed for sleep.    [provider]  aspirin 81 MG chewable tablet Chew 1 tablet (81 mg total) by mouth daily. 05/16/20   [name], [name], MD  clopidogrel (PLAVIX) 75 MG tablet Take 1 tablet (75 mg total) by mouth daily with breakfast. 05/16/20   [name], Sheppard Coil, MD    diclofenac Sodium (VOLTAREN) 1 % GEL Apply 2 g topically 4 (four) times daily as needed (pain).    [provider]  gabapentin (NEURONTIN) 300 MG capsule Take 300 mg by mouth 2 (two) times daily.  04/30/19   [provider]  insulin NPH-regular Human (NOVOLIN 70/30) (70-30) 100 UNIT/ML injection Inject 60-80 Units into the skin See admin instructions. Inject 80 units subcutaneously every morning and 60 units every afternoon    [provider]  Insulin Syringe-Needle U-100 (INSULIN SYRINGE 1CC/30GX5/16") 30G X 5/16" 1 ML MISC USE ONE TWICE DAILY WITH INSULIN 03/18/17   [provider]  isosorbide mononitrate (IMDUR) 30 MG 24 hr tablet Take 30 mg by mouth daily.    [provider]  lisinopril (ZESTRIL) 10 MG tablet Take 10 mg by mouth daily. 12/18/19   [provider]  metoprolol tartrate (LOPRESSOR) 100 MG tablet Take 100 mg by mouth 2 (two) times daily.  10/02/18   [provider]  omeprazole (PRILOSEC) 40 MG capsule Take 40 mg by mouth 2 (two) times daily.    [provider]  pravastatin (PRAVACHOL) 20 MG tablet Take 20 mg by mouth at bedtime. 05/01/20   [provider]  tiotropium (SPIRIVA HANDIHALER) 18 MCG inhalation capsule Place 18 mcg into inhaler and inhale daily.  09/22/17   [provider]  traMADol (ULTRAM) 50 MG tablet Take 50 mg by mouth every 6 (six) hours as needed for moderate pain.    [provider]  TRULICITY 1.5 0000000 SOPN Inject 0.5 mLs into the skin once a week. 03/21/20   [provider]    Physical Exam: Vitals:   05/20/20 1400 05/20/20 1430 05/20/20 1530 05/20/20 1608  BP: (!) 143/81 (!) 138/54 131/85 140/74  Pulse: 65 62 60 65  Resp: 18 19 18 16   Temp:      TempSrc:      SpO2: 98% 98% 99% 100%  Weight:      Height:       General: Not in acute distress.  Pale looking HEENT:       Eyes: PERRL, EOMI, no scleral icterus.       ENT: No discharge from the ears and  nose, no pharynx injection, no tonsillar enlargement.        Neck: No JVD, no bruit, no mass felt. Heme: No neck lymph node enlargement. Cardiac: S1/S2, RRR, No gallops or rubs. Respiratory: No rales, wheezing, rhonchi or rubs. GI: Soft, nondistended, nontender, no rebound pain, no organomegaly, BS present. GU: No hematuria Ext: No pitting leg edema bilaterally. 2+DP/PT pulse bilaterally. Musculoskeletal: No joint deformities, No joint redness or warmth, no limitation of ROM in spin.  Skin: No rashes.  Neuro: Alert, oriented X3, cranial nerves II-XII grossly intact, moves all extremities normally.  Psych: Patient is not psychotic, no suicidal or hemocidal ideation.  Labs on Admission: I have personally reviewed following labs and imaging studies  CBC: Recent Labs  Lab 05/16/20 0604 05/20/20 1054 05/20/20 1348  WBC 5.3 4.6 4.9  HGB 10.7* 8.6* 8.6*  HCT 33.0* 27.1* 26.8*  MCV 82.3 84.7 82.0  PLT 241 255 [id number]   Basic Metabolic Panel: Recent Labs  Lab 05/16/20 0604 05/20/20 1054  NA 139 139  K 4.6 4.2  CL 106 108  CO2 27 25  GLUCOSE 121* 171*  BUN 18 31*  CREATININE 0.80 0.87  CALCIUM 8.9 8.6*   GFR: Estimated Creatinine Clearance: 62.8 mL/min (by C-G formula based on SCr of 0.87 mg/dL). Liver Function Tests: Recent Labs  Lab 05/20/20 1054  AST 10*  ALT 19  ALKPHOS 66  BILITOT 0.6  PROT 6.7  ALBUMIN 3.6   No results for input(s): LIPASE, AMYLASE in the last 168 hours. No results for input(s): AMMONIA in the last 168 hours. Coagulation Profile: Recent Labs  Lab 05/20/20 1354  INR 1.0   Cardiac Enzymes: No results for input(s): CKTOTAL, CKMB, CKMBINDEX, TROPONINI in the last 168 hours. BNP (last 3 results) No results for input(s): PROBNP in the last 8760 hours. HbA1C: No results for input(s): HGBA1C in the last 72 hours. CBG: Recent Labs  Lab 05/15/20 1744 05/16/20 0822  GLUCAP 117* 106*   Lipid Profile: No results for input(s): CHOL, HDL, LDLCALC,  TRIG, CHOLHDL, LDLDIRECT in the last 72 hours. Thyroid Function Tests: No results for input(s): TSH, T4TOTAL, FREET4, T3FREE, THYROIDAB in the last 72 hours. Anemia Panel: No results for input(s): VITAMINB12, FOLATE, FERRITIN, TIBC, IRON, RETICCTPCT in the last 72 hours. Urine analysis:    Component Value Date/Time   COLORURINE YELLOW (A) 07/26/2019 1754   APPEARANCEUR HAZY (A) 07/26/2019 1754   APPEARANCEUR Clear 05/03/2013 1702   LABSPEC 1.031 (H) 07/26/2019 1754   LABSPEC 1.006 05/03/2013 1702   PHURINE 5.0 07/26/2019 1754   GLUCOSEU >=500 (A) 07/26/2019 1754   GLUCOSEU Negative 05/03/2013 1702   HGBUR NEGATIVE 07/26/2019 1754   BILIRUBINUR NEGATIVE 07/26/2019 1754   BILIRUBINUR Negative 05/03/2013 1702   KETONESUR 5 (A) 07/26/2019 1754   PROTEINUR 100 (A) 07/26/2019 1754   NITRITE NEGATIVE 07/26/2019 1754   LEUKOCYTESUR NEGATIVE 07/26/2019 1754   LEUKOCYTESUR 1+ 05/03/2013 1702   Sepsis Labs: @LABRCNTIP (procalcitonin:4,lacticidven:4) ) Recent Results (from the past 240 hour(s))  SARS CORONAVIRUS 2 (TAT 6-24 HRS) Nasopharyngeal Nasopharyngeal Swab     Status: None   Collection Time: 05/13/20 12:13 PM   Specimen: Nasopharyngeal Swab  Result Value Ref Range Status   SARS Coronavirus 2 NEGATIVE NEGATIVE Final    Comment: (NOTE) SARS-CoV-2 target nucleic acids are NOT DETECTED. The SARS-CoV-2 RNA is generally detectable in upper and lower respiratory specimens during the acute phase of infection. Negative results do not preclude SARS-CoV-2 infection, do not rule out co-infections with other pathogens, and should not be used as the sole basis for treatment or other patient management decisions. Negative results must be combined with clinical observations, patient history, and epidemiological information. The expected result is Negative. Fact Sheet for Patients: SugarRoll.be Fact Sheet for Healthcare  Providers: https://www.woods-mathews.com/ This test is not yet approved or cleared by the [place] FDA and  has been authorized for detection and/or diagnosis of SARS-CoV-2 by FDA under an Emergency Use Authorization (EUA). This EUA  will remain  in effect (meaning this test can be used) for the duration of the COVID-19 declaration under Section 56 4(b)(1) of the Act, 21 U.S.C. section 360bbb-3(b)(1), unless the authorization is terminated or revoked sooner. Performed at [facility], [place] [address], [place], [place] [address]   SARS Coronavirus 2 by RT PCR (hospital order, performed in [facility] hospital lab) Nasopharyngeal Nasopharyngeal Swab     Status: None   Collection Time: 05/20/20  1:46 PM   Specimen: Nasopharyngeal Swab  Result Value Ref Range Status   SARS Coronavirus 2 NEGATIVE NEGATIVE Final    Comment: (NOTE) SARS-CoV-2 target nucleic acids are NOT DETECTED. The SARS-CoV-2 RNA is generally detectable in upper and lower respiratory specimens during the acute phase of infection. The lowest concentration of SARS-CoV-2 viral copies this assay can detect is 250 copies / mL. A negative result does not preclude SARS-CoV-2 infection and should not be used as the sole basis for treatment or other patient management decisions.  A negative result may occur with improper specimen collection / handling, submission of specimen other than nasopharyngeal swab, presence of viral mutation(s) within the areas targeted by this assay, and inadequate number of viral copies (<250 copies / mL). A negative result must be combined with clinical observations, patient history, and epidemiological information. Fact Sheet for Patients:   StrictlyIdeas.no Fact Sheet for Healthcare Providers: BankingDealers.co.za This test is not yet approved or cleared  by the [place] FDA and has been authorized for detection and/or diagnosis  of SARS-CoV-2 by FDA under an Emergency Use Authorization (EUA).  This EUA will remain in effect (meaning this test can be used) for the duration of the COVID-19 declaration under Section 564(b)(1) of the Act, 21 U.S.C. section 360bbb-3(b)(1), unless the authorization is terminated or revoked sooner. Performed at [facility], [address], [place], [place] [address]      Radiological Exams on Admission: No results found.   EKG:   Not done in ED, will get one.   Assessment/Plan Principal Problem:   GIB (gastrointestinal bleeding) Active Problems:   CAD (coronary artery disease)   HTN (hypertension)   PUD (peptic ulcer disease)   HLD (hyperlipidemia)   Diabetes mellitus without complication (HCC)   Anemia due to blood loss   GIB (gastrointestinal bleeding): Hgb 10.7 -->8.6. likely upper GIB. Dr.Anna of GI is planning to do EGD in AM. I sent message to have asked Dr. Ubaldo Glassing of card about if we can hold plavix tonight. His PA, Doristine Mango returned back message, saying that it is okay to hold plavix tonight.  - Placed on progressive unit for observation - GI consulted by Ed, will follow up recommendations - NPO for possible EGD - IVF: 75 mL/hr - Start IV pantoprazole gtt - Zofran IV for nausea - Avoid NSAIDs and SQ heparin - Maintain IV access (2 large bore IVs if possible). - Monitor closely and follow q6h cbc, transfuse as necessary, if Hgb<7.0 - La[facility]: INR, PTT and type screen  CAD (coronary artery disease): s/of DES recently.  Patient is supposed to take both aspirin and Plavix, but she only takes Plavix currently. -hold plavix -Metoprolol, pravastatin, Imdur  HTN:  -Continue home medications: Metoprolol, lisinopril hydralazine prn  PUD (peptic ulcer disease) -on IV protonix  HLD (hyperlipidemia): -Pravastatin  Diabetes mellitus without complication (HCC) -Sliding scale insulin -Decrease NPH 70/30 insulin from 80-60 to 60-40 units twice  daily  Anemia due to blood loss: due to GIB. -on protonix  gtt -f/u cbc q6h      DVT ppx: SCD Code Status: Full code Family Communication: not done, no family member is at bed side.    Disposition Plan:  Anticipate discharge back to previous environment Consults called:  Dr. Vicente Males Admission status:  progressive unit for obs       Status is: Observation  The patient remains OBS appropriate and will d/c before 2 midnights.  Dispo: The patient is from: Home              Anticipated d/c is to: Home              Anticipated d/c date is: 1 day              Patient currently is not medically stable to d/c.          Date of Service 05/20/2020    Ivor Costa [facility]   If 7PM-7AM, please contact night-coverage [email or web address] 05/20/2020, 5:10 PM

## 2020-05-21 ENCOUNTER — Encounter
Admission: EM | Disposition: A | Discharge status: Home / Self Care | Payer: Self-pay | Source: Ambulatory Visit | Attending: Internal Medicine

## 2020-05-21 ENCOUNTER — Observation Stay: Payer: Medicare HMO | Admitting: Certified Registered"

## 2020-05-21 DIAGNOSIS — Z13810 Encounter for screening for upper gastrointestinal disorder: Secondary | ICD-10-CM | POA: Diagnosis not present

## 2020-05-21 DIAGNOSIS — D649 Anemia, unspecified: Secondary | ICD-10-CM | POA: Diagnosis not present

## 2020-05-21 DIAGNOSIS — E1136 Type 2 diabetes mellitus with diabetic cataract: Secondary | ICD-10-CM | POA: Diagnosis present

## 2020-05-21 DIAGNOSIS — Z20822 Contact with and (suspected) exposure to covid-19: Secondary | ICD-10-CM | POA: Diagnosis not present

## 2020-05-21 DIAGNOSIS — M810 Age-related osteoporosis without current pathological fracture: Secondary | ICD-10-CM | POA: Diagnosis present

## 2020-05-21 DIAGNOSIS — F329 Major depressive disorder, single episode, unspecified: Secondary | ICD-10-CM | POA: Diagnosis present

## 2020-05-21 DIAGNOSIS — I10 Essential (primary) hypertension: Secondary | ICD-10-CM | POA: Diagnosis not present

## 2020-05-21 DIAGNOSIS — Z882 Allergy status to sulfonamides status: Secondary | ICD-10-CM | POA: Diagnosis not present

## 2020-05-21 DIAGNOSIS — Z794 Long term (current) use of insulin: Secondary | ICD-10-CM | POA: Diagnosis not present

## 2020-05-21 DIAGNOSIS — Z880 Allergy status to penicillin: Secondary | ICD-10-CM | POA: Diagnosis not present

## 2020-05-21 DIAGNOSIS — K922 Gastrointestinal hemorrhage, unspecified: Secondary | ICD-10-CM | POA: Diagnosis not present

## 2020-05-21 DIAGNOSIS — K279 Peptic ulcer, site unspecified, unspecified as acute or chronic, without hemorrhage or perforation: Secondary | ICD-10-CM | POA: Diagnosis not present

## 2020-05-21 DIAGNOSIS — E1165 Type 2 diabetes mellitus with hyperglycemia: Secondary | ICD-10-CM | POA: Diagnosis not present

## 2020-05-21 DIAGNOSIS — Z885 Allergy status to narcotic agent status: Secondary | ICD-10-CM | POA: Diagnosis not present

## 2020-05-21 DIAGNOSIS — D500 Iron deficiency anemia secondary to blood loss (chronic): Secondary | ICD-10-CM | POA: Diagnosis not present

## 2020-05-21 DIAGNOSIS — Z8673 Personal history of transient ischemic attack (TIA), and cerebral infarction without residual deficits: Secondary | ICD-10-CM | POA: Diagnosis not present

## 2020-05-21 DIAGNOSIS — E1129 Type 2 diabetes mellitus with other diabetic kidney complication: Secondary | ICD-10-CM | POA: Diagnosis not present

## 2020-05-21 DIAGNOSIS — I2510 Atherosclerotic heart disease of native coronary artery without angina pectoris: Secondary | ICD-10-CM | POA: Diagnosis not present

## 2020-05-21 DIAGNOSIS — I350 Nonrheumatic aortic (valve) stenosis: Secondary | ICD-10-CM | POA: Diagnosis not present

## 2020-05-21 DIAGNOSIS — Z888 Allergy status to other drugs, medicaments and biological substances status: Secondary | ICD-10-CM | POA: Diagnosis not present

## 2020-05-21 DIAGNOSIS — E119 Type 2 diabetes mellitus without complications: Secondary | ICD-10-CM | POA: Diagnosis not present

## 2020-05-21 DIAGNOSIS — E7800 Pure hypercholesterolemia, unspecified: Secondary | ICD-10-CM | POA: Diagnosis not present

## 2020-05-21 DIAGNOSIS — E785 Hyperlipidemia, unspecified: Secondary | ICD-10-CM | POA: Diagnosis not present

## 2020-05-21 DIAGNOSIS — Z955 Presence of coronary angioplasty implant and graft: Secondary | ICD-10-CM | POA: Diagnosis not present

## 2020-05-21 DIAGNOSIS — K921 Melena: Secondary | ICD-10-CM | POA: Diagnosis not present

## 2020-05-21 DIAGNOSIS — K274 Chronic or unspecified peptic ulcer, site unspecified, with hemorrhage: Secondary | ICD-10-CM | POA: Diagnosis not present

## 2020-05-21 DIAGNOSIS — Z886 Allergy status to analgesic agent status: Secondary | ICD-10-CM | POA: Diagnosis not present

## 2020-05-21 DIAGNOSIS — Z7983 Long term (current) use of bisphosphonates: Secondary | ICD-10-CM | POA: Diagnosis not present

## 2020-05-21 DIAGNOSIS — K219 Gastro-esophageal reflux disease without esophagitis: Secondary | ICD-10-CM | POA: Diagnosis present

## 2020-05-21 DIAGNOSIS — E042 Nontoxic multinodular goiter: Secondary | ICD-10-CM | POA: Diagnosis present

## 2020-05-21 DIAGNOSIS — Z85828 Personal history of other malignant neoplasm of skin: Secondary | ICD-10-CM | POA: Diagnosis not present

## 2020-05-21 DIAGNOSIS — E1151 Type 2 diabetes mellitus with diabetic peripheral angiopathy without gangrene: Secondary | ICD-10-CM | POA: Diagnosis not present

## 2020-05-21 HISTORY — PX: ESOPHAGOGASTRODUODENOSCOPY (EGD) WITH PROPOFOL: SHX5813

## 2020-05-21 LAB — CBC
HCT: 24.4 % — ABNORMAL LOW (ref 36.0–46.0)
HCT: 28.3 % — ABNORMAL LOW (ref 36.0–46.0)
HCT: 26.2 % — ABNORMAL LOW (ref 36.0–46.0)
HCT: 26.4 % — ABNORMAL LOW (ref 36.0–46.0)
Hemoglobin: 8 g/dL — ABNORMAL LOW (ref 12.0–15.0)
Hemoglobin: 8.7 g/dL — ABNORMAL LOW (ref 12.0–15.0)
Hemoglobin: 8.4 g/dL — ABNORMAL LOW (ref 12.0–15.0)
Hemoglobin: 8.5 g/dL — ABNORMAL LOW (ref 12.0–15.0)
MCH: 27 pg (ref 26.0–34.0)
MCH: 26.4 pg (ref 26.0–34.0)
MCH: 26.8 pg (ref 26.0–34.0)
MCH: 26.7 pg (ref 26.0–34.0)
MCHC: 32.8 g/dL (ref 30.0–36.0)
MCHC: 30.7 g/dL (ref 30.0–36.0)
MCHC: 32.1 g/dL (ref 30.0–36.0)
MCHC: 32.2 g/dL (ref 30.0–36.0)
MCV: 82.4 fL (ref 80.0–100.0)
MCV: 86 fL (ref 80.0–100.0)
MCV: 83.7 fL (ref 80.0–100.0)
MCV: 83 fL (ref 80.0–100.0)
Platelets: 239 10*3/uL (ref 150–400)
Platelets: 276 10*3/uL (ref 150–400)
Platelets: 251 10*3/uL (ref 150–400)
Platelets: 286 10*3/uL (ref 150–400)
RBC: 2.96 MIL/uL — ABNORMAL LOW (ref 3.87–5.11)
RBC: 3.29 MIL/uL — ABNORMAL LOW (ref 3.87–5.11)
RBC: 3.13 MIL/uL — ABNORMAL LOW (ref 3.87–5.11)
RBC: 3.18 MIL/uL — ABNORMAL LOW (ref 3.87–5.11)
RDW: 14.6 % (ref 11.5–15.5)
RDW: 14.6 % (ref 11.5–15.5)
RDW: 14.7 % (ref 11.5–15.5)
RDW: 14.7 % (ref 11.5–15.5)
WBC: 5.1 10*3/uL (ref 4.0–10.5)
WBC: 3.7 10*3/uL — ABNORMAL LOW (ref 4.0–10.5)
WBC: 4.9 10*3/uL (ref 4.0–10.5)
WBC: 5.5 10*3/uL (ref 4.0–10.5)
nRBC: 0 % (ref 0.0–0.2)
nRBC: 0 % (ref 0.0–0.2)
nRBC: 0 % (ref 0.0–0.2)
nRBC: 0 % (ref 0.0–0.2)

## 2020-05-21 LAB — LIPID PANEL
Cholesterol: 130 mg/dL (ref 0–200)
HDL: 35 mg/dL — ABNORMAL LOW (ref >40)
LDL Cholesterol: 79 mg/dL (ref 0–99)
Total CHOL/HDL Ratio: 3.7 RATIO
Triglycerides: 81 mg/dL (ref <150)
VLDL: 16 mg/dL (ref 0–40)

## 2020-05-21 LAB — GLUCOSE, CAPILLARY
Glucose-Capillary: 115 mg/dL — ABNORMAL HIGH (ref 70–99)
Glucose-Capillary: 166 mg/dL — ABNORMAL HIGH (ref 70–99)
Glucose-Capillary: 40 mg/dL — CL (ref 70–99)
Glucose-Capillary: 92 mg/dL (ref 70–99)
Glucose-Capillary: 92 mg/dL (ref 70–99)
Glucose-Capillary: 96 mg/dL (ref 70–99)

## 2020-05-21 LAB — BASIC METABOLIC PANEL
Anion gap: 8 (ref 5–15)
BUN: 16 mg/dL (ref 8–23)
CO2: 25 mmol/L (ref 22–32)
Calcium: 8.7 mg/dL — ABNORMAL LOW (ref 8.9–10.3)
Chloride: 110 mmol/L (ref 98–111)
Creatinine, Ser: 0.86 mg/dL (ref 0.44–1.00)
GFR calc Af Amer: >60 mL/min (ref >60)
GFR calc non Af Amer: >60 mL/min (ref >60)
Glucose, Bld: 110 mg/dL — ABNORMAL HIGH (ref 70–99)
Potassium: 4.5 mmol/L (ref 3.5–5.1)
Sodium: 143 mmol/L (ref 135–145)

## 2020-05-21 LAB — HEMOGLOBIN A1C
Hgb A1c MFr Bld: 7.3 % — ABNORMAL HIGH (ref 4.8–5.6)
Mean Plasma Glucose: 162.81 mg/dL

## 2020-05-21 SURGERY — ESOPHAGOGASTRODUODENOSCOPY (EGD) WITH PROPOFOL
Anesthesia: General

## 2020-05-21 MED ORDER — SODIUM CHLORIDE 0.9 % IV SOLN: INTRAVENOUS | Status: DC

## 2020-05-21 MED ORDER — LIDOCAINE HCL (CARDIAC) PF 100 MG/5ML IV SOSY
PREFILLED_SYRINGE | INTRAVENOUS | Status: DC | PRN
  Administered 2020-05-21 (×3): 20 mg via INTRAVENOUS

## 2020-05-21 MED ORDER — BUTAMBEN-TETRACAINE-BENZOCAINE 2-2-14 % EX AERO
INHALATION_SPRAY | CUTANEOUS | Status: AC
  Filled 2020-05-21: qty 5

## 2020-05-21 MED ORDER — BUTAMBEN-TETRACAINE-BENZOCAINE 2-2-14 % EX AERO
INHALATION_SPRAY | CUTANEOUS | Status: DC | PRN
  Administered 2020-05-21: 2 via TOPICAL

## 2020-05-21 MED ORDER — GLYCOPYRROLATE 0.2 MG/ML IJ SOLN
INTRAMUSCULAR | Status: DC | PRN
  Administered 2020-05-21: .1 mg via INTRAVENOUS

## 2020-05-21 MED ORDER — PROPOFOL 500 MG/50ML IV EMUL
INTRAVENOUS | Status: DC | PRN
  Administered 2020-05-21: 50 ug/kg/min via INTRAVENOUS

## 2020-05-21 MED ORDER — PEG 3350-KCL-NA BICARB-NACL 420 G PO SOLR
4000.0000 mL | Freq: Once | ORAL | Status: AC
  Administered 2020-05-21: 4000 mL via ORAL
  Filled 2020-05-21 (×2): qty 4000

## 2020-05-21 NOTE — H&P (Signed)
Jonathon Bellows, MD [address] [place], [place], [place], [address] [address] [place], [place], [place], [address] Phone: [phone]  Fax: [phone]  Primary Care Physician:  [name], MD   Pre-Procedure History & Physical: HPI:  Joyce Potter is a 74 y.o. female is here for an endoscopy    Past Medical History:  Diagnosis Date  . Anemia    iron deficiency  . Aortic stenosis   . Basal cell carcinoma   . CAD (coronary artery disease)    s/p Left circumflex stent in 2012  . Carotid stenosis   . Cataract   . High cholesterol   . HTN (hypertension)   . Hyperlipidemia   . IDDM (insulin dependent diabetes mellitus)   . Multiple thyroid nodules    Benign  . Peptic ulcer disease   . Retinal artery occlusion    on left    Past Surgical History:  Procedure Laterality Date  . ABDOMINAL HYSTERECTOMY    . ARTERY BIOPSY Left 11/19/2015   Procedure: BIOPSY TEMPORAL ARTERY;  Surgeon: Algernon Huxley, MD;  Location: [facility] ORS;  Service: Vascular;  Laterality: Left;  . BREAST BIOPSY Left 2002   core- neg  . BREAST EXCISIONAL BIOPSY    . CARDIAC CATHETERIZATION N/A 08/08/2015   Procedure: Right and Left Heart Cath and Coronary Angiography;  Surgeon: Teodoro Spray, MD;  Location: [place] [facility];  Service: Cardiovascular;  Laterality: N/A;  . CARDIAC CATHETERIZATION Bilateral 09/03/2016   Procedure: Right/Left Heart Cath and Coronary Angiography;  Surgeon: Teodoro Spray, MD;  Location: [place] [facility];  Service: Cardiovascular;  Laterality: Bilateral;  . CATARACT EXTRACTION W/ INTRAOCULAR LENS  IMPLANT, BILATERAL    . COLONOSCOPY     in 2013- internal hemorrhoids  . COLONOSCOPY WITH PROPOFOL N/A 07/07/2018   Procedure: COLONOSCOPY WITH PROPOFOL;  Surgeon: Manya Silvas, MD;  Location: [facility] ENDOSCOPY;  Service: Endoscopy;  Laterality: N/A;  . CORONARY ANGIOGRAPHY N/A 09/14/2017   Procedure: CORONARY ANGIOGRAPHY;  Surgeon: Teodoro Spray, MD;  Location:  [place] [facility];  Service: Cardiovascular;  Laterality: N/A;  . CORONARY ANGIOPLASTY    . CORONARY STENT INTERVENTION N/A 05/15/2020   Procedure: coronary angiography;  Surgeon: Teodoro Spray, MD;  Location: [place] [facility];  Service: Cardiovascular;  Laterality: N/A;  . CORONARY STENT INTERVENTION N/A 05/15/2020   Procedure: CORONARY STENT INTERVENTION;  Surgeon: Isaias Cowman, MD;  Location: [place] [facility];  Service: Cardiovascular;  Laterality: N/A;  . CORONARY STENT PLACEMENT    . ENDARTERECTOMY Left 10/01/2015   Procedure: ENDARTERECTOMY CAROTID;  Surgeon: Algernon Huxley, MD;  Location: [facility] ORS;  Service: Vascular;  Laterality: Left;  . ESOPHAGOGASTRODUODENOSCOPY    . ESOPHAGOGASTRODUODENOSCOPY (EGD) WITH PROPOFOL N/A 05/31/2016   Procedure: ESOPHAGOGASTRODUODENOSCOPY (EGD) WITH PROPOFOL;  Surgeon: Manya Silvas, MD;  Location: [facility] ENDOSCOPY;  Service: Endoscopy;  Laterality: N/A;  . ESOPHAGOGASTRODUODENOSCOPY (EGD) WITH PROPOFOL N/A 07/07/2018   Procedure: ESOPHAGOGASTRODUODENOSCOPY (EGD) WITH PROPOFOL;  Surgeon: Manya Silvas, MD;  Location: [facility] ENDOSCOPY;  Service: Endoscopy;  Laterality: N/A;  . ESOPHAGOGASTRODUODENOSCOPY (EGD) WITH PROPOFOL N/A 07/17/2018   Procedure: ESOPHAGOGASTRODUODENOSCOPY (EGD) WITH PROPOFOL;  Surgeon: Lucilla Lame, MD;  Location: [facility] ENDOSCOPY;  Service: Endoscopy;  Laterality: N/A;  . EYE SURGERY    . GIVENS CAPSULE STUDY N/A 04/17/2013   Procedure: GIVENS CAPSULE STUDY;  Surgeon: Arta Silence, MD;  Location: [facility] ENDOSCOPY;  Service: Endoscopy;  Laterality: N/A;  patient  ate breakfast at [facility]  07/27/2016   Dr Viona Gilmore. Tamala Julian  . PARTIAL HYSTERECTOMY    . RIGHT AND LEFT HEART CATH Bilateral 09/14/2017   Procedure: RIGHT AND LEFT HEART CATH;  Surgeon: Teodoro Spray, MD;  Location: [place] [facility];  Service: Cardiovascular;  Laterality: Bilateral;  . RIGHT/LEFT HEART CATH AND CORONARY ANGIOGRAPHY Right 05/15/2020    Procedure: Right/Left Heart cath;  Surgeon: Teodoro Spray, MD;  Location: [place] [facility];  Service: Cardiovascular;  Laterality: Right;  . TRIGGER FINGER RELEASE      Prior to Admission medications   Medication Sig Start Date End Date Taking? Authorizing Provider  alendronate (FOSAMAX) 70 MG tablet Take 70 mg by mouth once a week.    Yes [provider]  clopidogrel (PLAVIX) 75 MG tablet Take 1 tablet (75 mg total) by mouth daily with breakfast. 05/16/20  Yes [name], [name], MD  gabapentin (NEURONTIN) 300 MG capsule Take 300 mg by mouth 2 (two) times daily.  04/30/19  Yes [provider]  insulin NPH-regular Human (NOVOLIN 70/30) (70-30) 100 UNIT/ML injection Inject 60-80 Units into the skin See admin instructions. Inject 80 units subcutaneously every morning and 60 units every afternoon   Yes [provider]  isosorbide mononitrate (IMDUR) 30 MG 24 hr tablet Take 30 mg by mouth daily.   Yes [provider]  lisinopril (ZESTRIL) 10 MG tablet Take 10 mg by mouth daily. 12/18/19  Yes [provider]  metoprolol tartrate (LOPRESSOR) 100 MG tablet Take 100 mg by mouth 2 (two) times daily.  10/02/18  Yes [provider]  pantoprazole (PROTONIX) 40 MG tablet Take 40 mg by mouth daily. 05/17/20  Yes [provider]  pravastatin (PRAVACHOL) 20 MG tablet Take 20 mg by mouth at bedtime. 05/01/20  Yes [provider]  tiotropium (SPIRIVA HANDIHALER) 18 MCG inhalation capsule Place 18 mcg into inhaler and inhale daily.  09/22/17  Yes [provider]  TRULICITY 1.5 0000000 SOPN Inject 0.5 mLs into the skin once a week. 03/21/20  Yes [provider]  albuterol (PROAIR HFA) 108 (90 Base) MCG/ACT inhaler Inhale 2 puffs into the lungs every 4 (four) hours as needed for wheezing or shortness of breath.     [provider]  amitriptyline (ELAVIL) 25 MG tablet Take 25 mg by mouth at bedtime as needed for sleep.     [provider]  aspirin 81 MG chewable tablet Chew 1 tablet (81 mg total) by mouth daily. Patient not taking: Reported on 05/20/2020 05/16/20   Isaias Cowman, MD  diclofenac Sodium (VOLTAREN) 1 % GEL Apply 2 g topically 4 (four) times daily as needed (pain).    [provider]  Insulin Syringe-Needle U-100 (INSULIN SYRINGE 1CC/30GX5/16") 30G X 5/16" 1 ML MISC USE ONE TWICE DAILY WITH INSULIN 03/18/17   [provider]  traMADol (ULTRAM) 50 MG tablet Take 50 mg by mouth every 6 (six) hours as needed for moderate pain.    [provider]    Allergies as of 05/20/2020 - Review Complete 05/20/2020  Allergen Reaction Noted  . Invokamet [canagliflozin-metformin hcl] Other (See Comments) 08/08/2015  . Sulfa antibiotics Other (See Comments) 04/13/2013  . Aspirin Other (See Comments) 09/28/2015  . Lovastatin Rash 08/08/2015  . Penicillins Rash 04/13/2013  . Vicodin [hydrocodone-acetaminophen] Rash 04/13/2013    Family History  Problem Relation Age of Onset  . Uterine cancer Mother   . Breast cancer Mother 33  .  Seizures Father   . Stroke Father   . Diabetes Father   . COPD Father   . Colon cancer Neg Hx     Social History   Socioeconomic History  . Marital status: Married    Spouse name: Not on file  . Number of children: Not on file  . Years of education: Not on file  . Highest education level: Not on file  Occupational History  . Not on file  Tobacco Use  . Smoking status: Former Smoker    Quit date: 02/15/1985    Years since quitting: 35.2  . Smokeless tobacco: Never Used  . Tobacco comment: quit about 31 years ago  Substance and Sexual Activity  . Alcohol use: No    Alcohol/week: 0.0 standard drinks  . Drug use: No  . Sexual activity: Not on file  Other Topics Concern  . Not on file  Social History Narrative   Lives at home independently.   Social Determinants of Health   Financial Resource Strain:   . Difficulty of Paying  Living Expenses:   Food Insecurity: No Food Insecurity  . Worried About Charity fundraiser in the Last Year: Never true  . Ran Out of Food in the Last Year: Never true  Transportation Needs: No Transportation Needs  . Lack of Transportation (Medical): No  . Lack of Transportation (Non-Medical): No  Physical Activity:   . Days of Exercise per Week:   . Minutes of Exercise per Session:   Stress:   . Feeling of Stress :   Social Connections:   . Frequency of Communication with Friends and Family:   . Frequency of Social Gatherings with Friends and Family:   . Attends Religious Services:   . Active Member of Clubs or Organizations:   . Attends Archivist Meetings:   Marland Kitchen Marital Status:   Intimate Partner Violence:   . Fear of Current or Ex-Partner:   . Emotionally Abused:   Marland Kitchen Physically Abused:   . Sexually Abused:     Review of Systems: See HPI, otherwise negative ROS  Physical Exam: BP 138/60   Pulse 61   Temp (!) 97.5 F (36.4 C) (Oral)   Resp 17   Ht 5\' 3"  (1.6 m)   Wt 91.4 kg   SpO2 97%   BMI 35.71 kg/m  General:   Alert,  pleasant and cooperative in NAD Head:  Normocephalic and atraumatic. Neck:  Supple; no masses or thyromegaly. Lungs:  Clear throughout to auscultation, normal respiratory effort.    Heart:  +S1, +S2, Regular rate and rhythm, No edema. Abdomen:  Soft, nontender and nondistended. Normal bowel sounds, without guarding, and without rebound.   Neurologic:  Alert and  oriented x4;  grossly normal neurologically.  Impression/Plan: Joyce Potter is here for an endoscopy  to be performed for  evaluation of melena    Risks, benefits, limitations, and alternatives regarding endoscopy have been reviewed with the patient.  Questions have been answered.  All parties agreeable.   Jonathon Bellows, MD  05/21/2020, 8:07 AM

## 2020-05-21 NOTE — Plan of Care (Signed)
  Problem: Education: Goal: Ability to identify signs and symptoms of gastrointestinal bleeding will improve Outcome: Progressing   Problem: Bowel/Gastric: Goal: Will show no signs and symptoms of gastrointestinal bleeding Outcome: Not Progressing Note: Patient stated earlier in the shift she had some significant melena, however, her H&H has actually improved. Already scheduled for a colonoscopy in the AM. Will continue to monitor GI status for the remainder of the shift. Wenda Low [facility]

## 2020-05-21 NOTE — Transfer of Care (Signed)
Immediate Anesthesia Transfer of Care Note  Patient: Joyce Potter  Procedure(s) Performed: ESOPHAGOGASTRODUODENOSCOPY (EGD) WITH PROPOFOL (N/A )  Patient Location: PACU  Anesthesia Type:MAC  Level of Consciousness: awake  Airway & Oxygen Therapy: Patient Spontanous Breathing  Post-op Assessment: Report given to RN  Post vital signs: Reviewed  Last Vitals:  Vitals Value Taken Time  BP    Temp    Pulse 64 05/21/20 0826  Resp 17 05/21/20 0826  SpO2 100 % 05/21/20 0826  Vitals shown include unvalidated device data.  Last Pain:  Vitals:   05/21/20 0438  TempSrc: Oral  PainSc:          Complications: No apparent anesthesia complications

## 2020-05-21 NOTE — Progress Notes (Signed)
Patient off unit for EGD. Will resume care upon return. Wenda Low [facility]

## 2020-05-21 NOTE — Progress Notes (Signed)
This nurse speaks to endoscopy who request report on patient for EGD procedure. Report given. This nurse informs endo nurse that consent needs signed, patient states she will sign once she speaks to doctor. Endo nurse states that's fine and they will obtain consent upon arrival.

## 2020-05-21 NOTE — Anesthesia Preprocedure Evaluation (Signed)
Anesthesia Evaluation  Patient identified by MRN, date of birth, ID band Patient awake    Reviewed: Allergy & Precautions, NPO status , Patient's Chart, lab work & pertinent test results  History of Anesthesia Complications Negative for: history of anesthetic complications  Airway Mallampati: III  TM Distance: >3 FB Neck ROM: Full    Dental  (+) Upper Dentures, Lower Dentures   Pulmonary neg sleep apnea, neg COPD, former smoker,    breath sounds clear to auscultation- rhonchi (-) wheezing      Cardiovascular hypertension, + CAD, + Past MI, + Cardiac Stents (stent placed 05/16/20, prior stent 2014) and + Peripheral Vascular Disease   Rhythm:Regular Rate:Normal - Systolic murmurs and - Diastolic murmurs    Neuro/Psych neg Seizures CVA negative psych ROS   GI/Hepatic Neg liver ROS, PUD,   Endo/Other  diabetes, Insulin Dependent  Renal/GU negative Renal ROS     Musculoskeletal negative musculoskeletal ROS (+)   Abdominal (+) + obese,   Peds  Hematology  (+) anemia ,   Anesthesia Other Findings Past Medical History: No date: Anemia     Comment:  iron deficiency No date: Aortic stenosis No date: Basal cell carcinoma No date: CAD (coronary artery disease)     Comment:  s/p Left circumflex stent in 2012 No date: Carotid stenosis No date: Cataract No date: High cholesterol No date: HTN (hypertension) No date: Hyperlipidemia No date: IDDM (insulin dependent diabetes mellitus) No date: Multiple thyroid nodules     Comment:  Benign No date: Peptic ulcer disease No date: Retinal artery occlusion     Comment:  on left   Reproductive/Obstetrics                            Anesthesia Physical  Anesthesia Plan  ASA: III  Anesthesia Plan: General   Post-op Pain Management:    Induction: Intravenous  PONV Risk Score and Plan: 2 and Propofol infusion  Airway Management Planned: Natural  Airway  Additional Equipment:   Intra-op Plan:   Post-operative Plan:   Informed Consent: I have reviewed the patients History and Physical, chart, labs and discussed the procedure including the risks, benefits and alternatives for the proposed anesthesia with the patient or authorized representative who has indicated his/her understanding and acceptance.     Dental advisory given  Plan Discussed with: CRNA and Anesthesiologist  Anesthesia Plan Comments: (Discussed with patient high risk for cardiac problems given recent stent placement)        Anesthesia Quick Evaluation  

## 2020-05-21 NOTE — Op Note (Signed)
[facility] Patient Name: Joyce Potter Procedure Date: 05/21/2020 7:28 AM MRN: [id number] Account #: [id number] Date of Birth: [birth year] Admit Type: Emergency Department Age: 74 Room: [facility] END[facility] ROOM 4 Gender: Female Note Status: Finalized Procedure:             Upper GI endoscopy Indications:           Melena Providers:             Jonathon Bellows MD, MD Referring MD:          [name], MD (Referring MD) Medicines:             Monitored Anesthesia Care Complications:         No immediate complications. Procedure:             Pre-Anesthesia Assessment:                        - Prior to the procedure, a History and Physical was                         performed, and patient medications, allergies and                         sensitivities were reviewed. The patient's tolerance                         of previous anesthesia was reviewed.                        - The risks and benefits of the procedure and the                         sedation options and risks were discussed with the                         patient. All questions were answered and informed                         consent was obtained.                        - ASA Grade Assessment: III - A patient with severe                         systemic disease.                        After obtaining informed consent, the endoscope was                         passed under direct vision. Throughout the procedure,                         the patient's blood pressure, pulse, and oxygen                         saturations were monitored continuously. The Endoscope                         was introduced through the mouth, and  advanced to the                         third part of duodenum. The upper GI endoscopy was                         accomplished with ease. The patient tolerated the                         procedure well. Findings:      The esophagus was normal.      The stomach was  normal.      The examined duodenum was normal. Impression:            - Normal esophagus.                        - Normal stomach.                        - Normal examined duodenum.                        - No specimens collected. Recommendation:        - Return patient to hospital ward for ongoing care.                        - Clear liquid diet today.                        - Continue present medications.                        - Perform a colonoscopy tomorrow. Procedure Code(s):     --- Professional ---                        [phone], Esophagogastroduodenoscopy, flexible,                         transoral; diagnostic, including collection of                         specimen(s) by brushing or washing, when performed                         (separate procedure) Diagnosis Code(s):     --- Professional ---                        K92.1, Melena (includes Hematochezia) CPT copyright 2019 American Medical Association. All rights reserved. The codes documented in this report are preliminary and upon coder review may  be revised to meet current compliance requirements. Jonathon Bellows, MD Jonathon Bellows MD, MD 05/21/2020 8:22:03 AM This report has been signed electronically. Number of Addenda: 0 Note Initiated On: 05/21/2020 7:28 AM Estimated Blood Loss:  Estimated blood loss: none.      [facility]

## 2020-05-21 NOTE — Anesthesia Postprocedure Evaluation (Signed)
Anesthesia Post Note  Patient: Joyce Potter  Procedure(s) Performed: ESOPHAGOGASTRODUODENOSCOPY (EGD) WITH PROPOFOL (N/A )  Patient location during evaluation: Endoscopy Anesthesia Type: General Level of consciousness: awake and alert and oriented Pain management: pain level controlled Vital Signs Assessment: post-procedure vital signs reviewed and stable Respiratory status: spontaneous breathing, nonlabored ventilation and respiratory function stable Cardiovascular status: blood pressure returned to baseline and stable Postop Assessment: no signs of nausea or vomiting Anesthetic complications: no     Last Vitals:  Vitals:   05/21/20 0856 05/21/20 1236  BP: 110/63 137/65  Pulse: 63 69  Resp: 12 19  Temp:  36.8 C  SpO2: 95% 97%    Last Pain:  Vitals:   05/21/20 1236  TempSrc: Oral  PainSc:                  Layloni Fahrner

## 2020-05-21 NOTE — Progress Notes (Addendum)
PROGRESS NOTE    Joyce Potter  [id number] DOB: 07-Feb-1946 DOA: 05/20/2020 PCP: [name], MD     Brief Narrative:  Joyce Potter is a 74 y.o. WF PMHx HTN, HLD, DM type II, stroke, depression, GERD, PUD, GI bleeding, CAD, stent placement, aortic stenosis, anemia, CAD, carotid artery stenosis, PVD,   who presents with dark stool.  Patient was recently hospitalized from 5/20-5/21 and had cardiac cath, PCI with drug-eluting stent placement.  Patient is discharged on aspirin and Plavix.  Patient states that she is only taking Plavix, not aspirin currently.  She developed black stool, and was seen by her cardiologist, Dr. Ubaldo Glassing, found to have low hemoglobin, sent to ED for further evaluation treatment.  Patient has lightheadedness, but no chest pain, shortness of breath.  Denies nausea, vomiting, diarrhea or abdominal pain.  No symptoms of UTI or unilateral weakness.  ED Course: pt was found to have hemoglobin 8.6 (10.7 on 05/16/2020), pending COVID-19 PCR, electrolytes renal function okay, temperature normal, blood pressure 131/72, heart rate 64, RR 15, oxygen saturation 99% on room air.  Patient is placed on progressive bed for observation.  Dr. Vicente Males of GI is consulted.   Subjective: A/O x4, negative S OB, negative abdominal pain, positive nausea.  States 2014 had 3/4 hemorrhoids banded.  Currently passing BRBPR.   Assessment & Plan:   Principal Problem:   GIB (gastrointestinal bleeding) Active Problems:   CAD (coronary artery disease)   HTN (hypertension)   PUD (peptic ulcer disease)   HLD (hyperlipidemia)   Diabetes mellitus without complication ([place])   Anemia due to blood loss   GIB (gastrointestinal bleeding):  -Dr.Anna Kiran of GI to see patient Recent Labs  Lab 05/20/20 1348 05/20/20 1934 05/21/20 0119 05/21/20 1008 05/21/20 1559  HGB 8.6* 8.4* 8.0* 8.7* 8.4*  Stable -Transfuse for hemoglobin<7 -5/26 s/p EGD negative findings for cause of  bleeding -Scheduled for colonoscopy 5/27 -EEG stat -H/H q 6 hrs  Anemia due to blood loss: due to GIB. -on protonix gtt -See GI bleed  CAD (coronary artery disease):  -S/of DES recently.  Patient is supposed to take both aspirin and Plavix, but she only takes Plavix currently. -hold plavix -Metoprolol, pravastatin, Imdur  HTN:  -Continue home medications: Metoprolol, lisinopril hydralazine prn  PUD (peptic ulcer disease) -on IV protonix  HLD (hyperlipidemia): -Pravastatin  Diabetes mellitus without complication (HCC) -Sliding scale insulin -Decrease NPH 70/30 insulin from 80-60 to 60-40 units twice daily    DVT prophylaxis: SCD Code Status: Full Family Communication:  Disposition Plan:  Status is: Inpatient    Dispo: The patient is from: Home              Anticipated d/c is to: Home              Anticipated d/c date is: 5/27              Patient currently unstable      Consultants:  GI  Procedures/Significant Events:  5/26 EGD; negative findings   I have personally reviewed and interpreted all radiology studies and my findings are as above.  VENTILATOR SETTINGS:    Cultures   Antimicrobials:    Devices    LINES / TUBES:      Continuous Infusions: . sodium chloride 75 mL/hr at 05/21/20 0457  . sodium chloride    . pantoprozole (PROTONIX) infusion 8 mg/hr (05/20/20 2228)     Objective: Vitals:   05/20/20 1608 05/20/20 1842 05/20/20 1936  05/21/20 0438  BP: 140/74 127/72 (!) 142/61 138/60  Pulse: 65 69 61 61  Resp: 16 17    Temp:  98.2 F (36.8 C) 98 F (36.7 C) (!) 97.5 F (36.4 C)  TempSrc:  Oral Oral Oral  SpO2: 100% 100% 98% 97%  Weight:  95.4 kg  91.4 kg  Height:  5\' 3"  (1.6 m)      Intake/Output Summary (Last 24 hours) at 05/21/2020 [id number] Last data filed at 05/21/2020 0019 Gross per 24 hour  Intake --  Output 0 ml  Net 0 ml   Filed Weights   05/20/20 1045 05/20/20 1842 05/21/20 0438  Weight: 91.6 kg 95.4 kg  91.4 kg    Examination:  General: A/O x4, no acute respiratory distress Eyes: negative scleral hemorrhage, negative anisocoria, negative icterus ENT: Negative Runny nose, negative gingival bleeding, Neck:  Negative scars, masses, torticollis, lymphadenopathy, JVD Lungs: Clear to auscultation bilaterally without wheezes or crackles Cardiovascular: Regular rate and rhythm without murmur gallop or rub normal S1 and S2 Abdomen: OBESE, negative abdominal pain, nondistended, positive soft, bowel sounds, no rebound, no ascites, no appreciable mass Extremities: No significant cyanosis, clubbing, or edema bilateral lower extremities Skin: Negative rashes, lesions, ulcers Psychiatric:  Negative depression, negative anxiety, negative fatigue, negative mania  Central nervous system:  Cranial nerves II through XII intact, tongue/uvula midline, all extremities muscle strength 5/5, sensation intact throughout, negative dysarthria, negative expressive aphasia, negative receptive aphasia.  .     Data Reviewed: Care during the described time interval was provided by me .  I have reviewed this patient's available data, including medical history, events of note, physical examination, and all test results as part of my evaluation.  CBC: Recent Labs  Lab 05/16/20 0604 05/20/20 1054 05/20/20 1348 05/20/20 1934 05/21/20 0119  WBC 5.3 4.6 4.9 5.0 5.1  HGB 10.7* 8.6* 8.6* 8.4* 8.0*  HCT 33.0* 27.1* 26.8* 26.3* 24.4*  MCV 82.3 84.7 82.0 84.6 82.4  PLT 241 255 248 246 [id number]   Basic Metabolic Panel: Recent Labs  Lab 05/16/20 0604 05/20/20 1054  NA 139 139  K 4.6 4.2  CL 106 108  CO2 27 25  GLUCOSE 121* 171*  BUN 18 31*  CREATININE 0.80 0.87  CALCIUM 8.9 8.6*   GFR: Estimated Creatinine Clearance: 62.7 mL/min (by C-G formula based on SCr of 0.87 mg/dL). Liver Function Tests: Recent Labs  Lab 05/20/20 1054  AST 10*  ALT 19  ALKPHOS 66  BILITOT 0.6  PROT 6.7  ALBUMIN 3.6   No results  for input(s): LIPASE, AMYLASE in the last 168 hours. No results for input(s): AMMONIA in the last 168 hours. Coagulation Profile: Recent Labs  Lab 05/20/20 1354  INR 1.0   Cardiac Enzymes: No results for input(s): CKTOTAL, CKMB, CKMBINDEX, TROPONINI in the last 168 hours. BNP (last 3 results) No results for input(s): PROBNP in the last 8760 hours. HbA1C: No results for input(s): HGBA1C in the last 72 hours. CBG: Recent Labs  Lab 05/15/20 1744 05/16/20 0822 05/20/20 2159 05/21/20 0010 05/21/20 0436  GLUCAP 117* 106* 89 92 92   Lipid Profile: No results for input(s): CHOL, HDL, LDLCALC, TRIG, CHOLHDL, LDLDIRECT in the last 72 hours. Thyroid Function Tests: No results for input(s): TSH, T4TOTAL, FREET4, T3FREE, THYROIDAB in the last 72 hours. Anemia Panel: No results for input(s): VITAMINB12, FOLATE, FERRITIN, TIBC, IRON, RETICCTPCT in the last 72 hours. Sepsis Labs: No results for input(s): PROCALCITON, LATICACIDVEN in the last 168 hours.  Recent Results (  from the past 240 hour(s))  SARS CORONAVIRUS 2 (TAT 6-24 HRS) Nasopharyngeal Nasopharyngeal Swab     Status: None   Collection Time: 05/13/20 12:13 PM   Specimen: Nasopharyngeal Swab  Result Value Ref Range Status   SARS Coronavirus 2 NEGATIVE NEGATIVE Final    Comment: (NOTE) SARS-CoV-2 target nucleic acids are NOT DETECTED. The SARS-CoV-2 RNA is generally detectable in upper and lower respiratory specimens during the acute phase of infection. Negative results do not preclude SARS-CoV-2 infection, do not rule out co-infections with other pathogens, and should not be used as the sole basis for treatment or other patient management decisions. Negative results must be combined with clinical observations, patient history, and epidemiological information. The expected result is Negative. Fact Sheet for Patients: SugarRoll.be Fact Sheet for Healthcare  Providers: https://www.Joylynn Defrancesco-mathews.com/ This test is not yet approved or cleared by the [place] FDA and  has been authorized for detection and/or diagnosis of SARS-CoV-2 by FDA under an Emergency Use Authorization (EUA). This EUA will remain  in effect (meaning this test can be used) for the duration of the COVID-19 declaration under Section 56 4(b)(1) of the Act, 21 U.S.C. section 360bbb-3(b)(1), unless the authorization is terminated or revoked sooner. Performed at [facility], [place] [address] [place], [place] [address]   SARS Coronavirus 2 by RT PCR (hospital order, performed in [facility] hospital lab) Nasopharyngeal Nasopharyngeal Swab     Status: None   Collection Time: 05/20/20  1:46 PM   Specimen: Nasopharyngeal Swab  Result Value Ref Range Status   SARS Coronavirus 2 NEGATIVE NEGATIVE Final    Comment: (NOTE) SARS-CoV-2 target nucleic acids are NOT DETECTED. The SARS-CoV-2 RNA is generally detectable in upper and lower respiratory specimens during the acute phase of infection. The lowest concentration of SARS-CoV-2 viral copies this assay can detect is 250 copies / mL. A negative result does not preclude SARS-CoV-2 infection and should not be used as the sole basis for treatment or other patient management decisions.  A negative result may occur with improper specimen collection / handling, submission of specimen other than nasopharyngeal swab, presence of viral mutation(s) within the areas targeted by this assay, and inadequate number of viral copies (<250 copies / mL). A negative result must be combined with clinical observations, patient history, and epidemiological information. Fact Sheet for Patients:   StrictlyIdeas.no Fact Sheet for Healthcare Providers: BankingDealers.co.za This test is not yet approved or cleared  by the [place] FDA and has been authorized for detection and/or diagnosis  of SARS-CoV-2 by FDA under an Emergency Use Authorization (EUA).  This EUA will remain in effect (meaning this test can be used) for the duration of the COVID-19 declaration under Section 564(b)(1) of the Act, 21 U.S.C. section 360bbb-3(b)(1), unless the authorization is terminated or revoked sooner. Performed at [facility], [address], [place], [place] [address]          Radiology Studies: No results found.      Scheduled Meds: . butamben-tetracaine-benzocaine      . [MAR Hold] gabapentin  300 mg Oral BID  . [MAR Hold] insulin aspart  0-9 Units Subcutaneous Q4H  . [MAR Hold] insulin aspart protamine- aspart  60 Units Subcutaneous Q breakfast   And  . [MAR Hold] insulin aspart protamine- aspart  40 Units Subcutaneous Q supper  . [MAR Hold] isosorbide mononitrate  30 mg Oral Daily  . [MAR Hold] lisinopril  10 mg Oral Daily  . [MAR Hold] metoprolol tartrate  100 mg Oral BID  . [MAR Hold] pravastatin  20 mg Oral QHS  . [MAR Hold] tiotropium  18 mcg Inhalation Daily   Continuous Infusions: . sodium chloride 75 mL/hr at 05/21/20 0457  . sodium chloride    . pantoprozole (PROTONIX) infusion 8 mg/hr (05/20/20 2228)     LOS: 0 days    Time spent:40 min    Joyce Potter, Geraldo Docker, MD [facility] Pager [phone]  If 7PM-7AM, please contact night-coverage [email or web address] Password [facility] 05/21/2020, 8:12 AM

## 2020-05-21 NOTE — Progress Notes (Signed)
Upper Endoscopy completed by Dr. Vicente Males.  Report called to Kyung Bacca, RN including that patient's throat sprayed with cetacaine at 8:14am and should remain NPO until 9:14am.

## 2020-05-22 ENCOUNTER — Inpatient Hospital Stay: Payer: Medicare HMO | Admitting: Certified Registered"

## 2020-05-22 ENCOUNTER — Encounter
Admission: EM | Disposition: A | Discharge status: Home / Self Care | Payer: Self-pay | Source: Ambulatory Visit | Attending: Internal Medicine

## 2020-05-22 ENCOUNTER — Encounter: Payer: Self-pay | Admitting: Internal Medicine

## 2020-05-22 HISTORY — PX: COLONOSCOPY WITH PROPOFOL: SHX5780

## 2020-05-22 LAB — GLUCOSE, CAPILLARY
Glucose-Capillary: 100 mg/dL — ABNORMAL HIGH (ref 70–99)
Glucose-Capillary: 124 mg/dL — ABNORMAL HIGH (ref 70–99)
Glucose-Capillary: 128 mg/dL — ABNORMAL HIGH (ref 70–99)
Glucose-Capillary: 151 mg/dL — ABNORMAL HIGH (ref 70–99)
Glucose-Capillary: 197 mg/dL — ABNORMAL HIGH (ref 70–99)
Glucose-Capillary: 47 mg/dL — ABNORMAL LOW (ref 70–99)
Glucose-Capillary: 75 mg/dL (ref 70–99)
Glucose-Capillary: 79 mg/dL (ref 70–99)
Glucose-Capillary: 90 mg/dL (ref 70–99)

## 2020-05-22 LAB — COMPREHENSIVE METABOLIC PANEL
ALT: 19 U/L (ref 0–44)
AST: 20 U/L (ref 15–41)
Albumin: 3.4 g/dL — ABNORMAL LOW (ref 3.5–5.0)
Alkaline Phosphatase: 59 U/L (ref 38–126)
Anion gap: 7 (ref 5–15)
BUN: 11 mg/dL (ref 8–23)
CO2: 25 mmol/L (ref 22–32)
Calcium: 8.3 mg/dL — ABNORMAL LOW (ref 8.9–10.3)
Chloride: 108 mmol/L (ref 98–111)
Creatinine, Ser: 0.81 mg/dL (ref 0.44–1.00)
GFR calc Af Amer: >60 mL/min (ref >60)
GFR calc non Af Amer: >60 mL/min (ref >60)
Glucose, Bld: 96 mg/dL (ref 70–99)
Potassium: 3.9 mmol/L (ref 3.5–5.1)
Sodium: 140 mmol/L (ref 135–145)
Total Bilirubin: 0.6 mg/dL (ref 0.3–1.2)
Total Protein: 6.4 g/dL — ABNORMAL LOW (ref 6.5–8.1)

## 2020-05-22 LAB — CBC
HCT: 24.2 % — ABNORMAL LOW (ref 36.0–46.0)
HCT: 24.2 % — ABNORMAL LOW (ref 36.0–46.0)
Hemoglobin: 7.8 g/dL — ABNORMAL LOW (ref 12.0–15.0)
Hemoglobin: 7.7 g/dL — ABNORMAL LOW (ref 12.0–15.0)
MCH: 26.8 pg (ref 26.0–34.0)
MCH: 26.6 pg (ref 26.0–34.0)
MCHC: 32.2 g/dL (ref 30.0–36.0)
MCHC: 31.8 g/dL (ref 30.0–36.0)
MCV: 83.2 fL (ref 80.0–100.0)
MCV: 83.4 fL (ref 80.0–100.0)
Platelets: 267 10*3/uL (ref 150–400)
Platelets: 244 10*3/uL (ref 150–400)
RBC: 2.91 MIL/uL — ABNORMAL LOW (ref 3.87–5.11)
RBC: 2.9 MIL/uL — ABNORMAL LOW (ref 3.87–5.11)
RDW: 14.7 % (ref 11.5–15.5)
RDW: 14.7 % (ref 11.5–15.5)
WBC: 5.1 10*3/uL (ref 4.0–10.5)
WBC: 4.2 10*3/uL (ref 4.0–10.5)
nRBC: 0 % (ref 0.0–0.2)
nRBC: 0 % (ref 0.0–0.2)

## 2020-05-22 LAB — HEMOGLOBIN AND HEMATOCRIT, BLOOD
HCT: 24.6 % — ABNORMAL LOW (ref 36.0–46.0)
HCT: 26.2 % — ABNORMAL LOW (ref 36.0–46.0)
Hemoglobin: 7.7 g/dL — ABNORMAL LOW (ref 12.0–15.0)
Hemoglobin: 8.1 g/dL — ABNORMAL LOW (ref 12.0–15.0)

## 2020-05-22 LAB — MAGNESIUM: Magnesium: 1.9 mg/dL (ref 1.7–2.4)

## 2020-05-22 LAB — PHOSPHORUS: Phosphorus: 2.9 mg/dL (ref 2.5–4.6)

## 2020-05-22 SURGERY — COLONOSCOPY WITH PROPOFOL
Anesthesia: General

## 2020-05-22 MED ORDER — PROPOFOL 500 MG/50ML IV EMUL
INTRAVENOUS | Status: AC
  Filled 2020-05-22: qty 50

## 2020-05-22 MED ORDER — DEXTROSE 50 % IV SOLN
INTRAVENOUS | Status: AC
  Administered 2020-05-22: 25 g via INTRAVENOUS
  Filled 2020-05-22: qty 50

## 2020-05-22 MED ORDER — DEXTROSE 50 % IV SOLN: 25.0000 g | INTRAVENOUS | Status: AC

## 2020-05-22 MED ORDER — PROPOFOL 500 MG/50ML IV EMUL
INTRAVENOUS | Status: DC | PRN
  Administered 2020-05-22: 150 ug/kg/min via INTRAVENOUS

## 2020-05-22 MED ORDER — CLOPIDOGREL BISULFATE 75 MG PO TABS
75.0000 mg | ORAL_TABLET | Freq: Every day | ORAL | Status: DC
  Administered 2020-05-22 – 2020-05-23 (×2): 75 mg via ORAL
  Filled 2020-05-22 (×2): qty 1

## 2020-05-22 MED ORDER — PROPOFOL 10 MG/ML IV BOLUS
INTRAVENOUS | Status: DC | PRN
  Administered 2020-05-22: 40 mg via INTRAVENOUS

## 2020-05-22 MED ORDER — DEXTROSE 50 % IV SOLN
25.0000 g | INTRAVENOUS | Status: AC
  Administered 2020-05-22: 25 g via INTRAVENOUS

## 2020-05-22 MED ORDER — DEXTROSE 50 % IV SOLN
INTRAVENOUS | Status: AC
  Filled 2020-05-22: qty 50

## 2020-05-22 NOTE — Anesthesia Preprocedure Evaluation (Signed)
Anesthesia Evaluation  Patient identified by MRN, date of birth, ID band Patient awake    Reviewed: Allergy & Precautions, NPO status , Patient's Chart, lab work & pertinent test results  History of Anesthesia Complications Negative for: history of anesthetic complications  Airway Mallampati: III  TM Distance: >3 FB Neck ROM: Full    Dental  (+) Upper Dentures, Lower Dentures, Dental Advidsory Given   Pulmonary neg sleep apnea, neg COPD, former smoker,    breath sounds clear to auscultation- rhonchi (-) wheezing      Cardiovascular hypertension, + CAD, + Past MI, + Cardiac Stents (stent placed 05/16/20, prior stent 2014) and + Peripheral Vascular Disease   Rhythm:Regular Rate:Normal - Systolic murmurs and - Diastolic murmurs    Neuro/Psych neg Seizures CVA negative psych ROS   GI/Hepatic Neg liver ROS, PUD,   Endo/Other  diabetes, Insulin Dependent  Renal/GU negative Renal ROS     Musculoskeletal negative musculoskeletal ROS (+)   Abdominal (+) + obese,   Peds  Hematology  (+) Blood dyscrasia, anemia ,   Anesthesia Other Findings Past Medical History: No date: Anemia     Comment:  iron deficiency No date: Aortic stenosis No date: Basal cell carcinoma No date: CAD (coronary artery disease)     Comment:  s/p Left circumflex stent in 2012 No date: Carotid stenosis No date: Cataract No date: High cholesterol No date: HTN (hypertension) No date: Hyperlipidemia No date: IDDM (insulin dependent diabetes mellitus) No date: Multiple thyroid nodules     Comment:  Benign No date: Peptic ulcer disease No date: Retinal artery occlusion     Comment:  on left   Reproductive/Obstetrics                             Anesthesia Physical  Anesthesia Plan  ASA: III  Anesthesia Plan: General   Post-op Pain Management:    Induction: Intravenous  PONV Risk Score and Plan: 2 and Propofol  infusion and TIVA  Airway Management Planned: Natural Airway and Nasal Cannula  Additional Equipment:   Intra-op Plan:   Post-operative Plan:   Informed Consent: I have reviewed the patients History and Physical, chart, labs and discussed the procedure including the risks, benefits and alternatives for the proposed anesthesia with the patient or authorized representative who has indicated his/her understanding and acceptance.     Dental advisory given  Plan Discussed with: CRNA and Anesthesiologist  Anesthesia Plan Comments: (Discussed with patient high risk for cardiac problems given recent stent placement)        Anesthesia Quick Evaluation  

## 2020-05-22 NOTE — Progress Notes (Signed)
IV fluid and Protonix had been stopped after procedure.   Epic down at time of return to unit.   Called Night time MD regarding protonix and IV fluid.  Per MD ok to discontinue since EGD and Colonoscopy was negative for any active bleeding and patient is eating/drinking.

## 2020-05-22 NOTE — H&P (Signed)
Jonathon Bellows, MD [address] [place], [place], [place], [address] [address], [place], [place], [place], [address] Phone: [phone]  Fax: [phone]  Primary Care Physician:  [name], MD   Pre-Procedure History & Physical: HPI:  Joyce Potter is a 74 y.o. female is here for an colonoscopy.   Past Medical History:  Diagnosis Date  . Anemia    iron deficiency  . Aortic stenosis   . Basal cell carcinoma   . CAD (coronary artery disease)    s/p Left circumflex stent in 2012  . Carotid stenosis   . Cataract   . High cholesterol   . HTN (hypertension)   . Hyperlipidemia   . IDDM (insulin dependent diabetes mellitus)   . Multiple thyroid nodules    Benign  . Peptic ulcer disease   . Retinal artery occlusion    on left    Past Surgical History:  Procedure Laterality Date  . ABDOMINAL HYSTERECTOMY    . ARTERY BIOPSY Left 11/19/2015   Procedure: BIOPSY TEMPORAL ARTERY;  Surgeon: Algernon Huxley, MD;  Location: [facility] ORS;  Service: Vascular;  Laterality: Left;  . BREAST BIOPSY Left 2002   core- neg  . BREAST EXCISIONAL BIOPSY    . CARDIAC CATHETERIZATION N/A 08/08/2015   Procedure: Right and Left Heart Cath and Coronary Angiography;  Surgeon: Teodoro Spray, MD;  Location: [facility];  Service: Cardiovascular;  Laterality: N/A;  . CARDIAC CATHETERIZATION Bilateral 09/03/2016   Procedure: Right/Left Heart Cath and Coronary Angiography;  Surgeon: Teodoro Spray, MD;  Location: [facility];  Service: Cardiovascular;  Laterality: Bilateral;  . CATARACT EXTRACTION W/ INTRAOCULAR LENS  IMPLANT, BILATERAL    . COLONOSCOPY     in 2013- internal hemorrhoids  . COLONOSCOPY WITH PROPOFOL N/A 07/07/2018   Procedure: COLONOSCOPY WITH PROPOFOL;  Surgeon: Manya Silvas, MD;  Location: [facility] ENDOSCOPY;  Service: Endoscopy;  Laterality: N/A;  . CORONARY ANGIOGRAPHY N/A 09/14/2017   Procedure: CORONARY ANGIOGRAPHY;  Surgeon: Teodoro Spray, MD;   Location: [facility];  Service: Cardiovascular;  Laterality: N/A;  . CORONARY ANGIOPLASTY    . CORONARY STENT INTERVENTION N/A 05/15/2020   Procedure: coronary angiography;  Surgeon: Teodoro Spray, MD;  Location: [facility];  Service: Cardiovascular;  Laterality: N/A;  . CORONARY STENT INTERVENTION N/A 05/15/2020   Procedure: CORONARY STENT INTERVENTION;  Surgeon: Isaias Cowman, MD;  Location: [facility];  Service: Cardiovascular;  Laterality: N/A;  . CORONARY STENT PLACEMENT    . ENDARTERECTOMY Left 10/01/2015   Procedure: ENDARTERECTOMY CAROTID;  Surgeon: Algernon Huxley, MD;  Location: [facility] ORS;  Service: Vascular;  Laterality: Left;  . ESOPHAGOGASTRODUODENOSCOPY    . ESOPHAGOGASTRODUODENOSCOPY (EGD) WITH PROPOFOL N/A 05/31/2016   Procedure: ESOPHAGOGASTRODUODENOSCOPY (EGD) WITH PROPOFOL;  Surgeon: Manya Silvas, MD;  Location: [facility] ENDOSCOPY;  Service: Endoscopy;  Laterality: N/A;  . ESOPHAGOGASTRODUODENOSCOPY (EGD) WITH PROPOFOL N/A 07/07/2018   Procedure: ESOPHAGOGASTRODUODENOSCOPY (EGD) WITH PROPOFOL;  Surgeon: Manya Silvas, MD;  Location: [facility] ENDOSCOPY;  Service: Endoscopy;  Laterality: N/A;  . ESOPHAGOGASTRODUODENOSCOPY (EGD) WITH PROPOFOL N/A 07/17/2018   Procedure: ESOPHAGOGASTRODUODENOSCOPY (EGD) WITH PROPOFOL;  Surgeon: Lucilla Lame, MD;  Location: [facility] ENDOSCOPY;  Service: Endoscopy;  Laterality: N/A;  . EYE SURGERY    . GIVENS CAPSULE STUDY N/A 04/17/2013   Procedure: GIVENS CAPSULE STUDY;  Surgeon: Arta Silence, MD;  Location: [facility] ENDOSCOPY;  Service: Endoscopy;  Laterality: N/A;  patient ate  breakfast at [place]  07/27/2016   Dr Viona Gilmore. Tamala Julian  . PARTIAL HYSTERECTOMY    . RIGHT AND LEFT HEART CATH Bilateral 09/14/2017   Procedure: RIGHT AND LEFT HEART CATH;  Surgeon: Teodoro Spray, MD;  Location: [facility];  Service: Cardiovascular;  Laterality: Bilateral;  . RIGHT/LEFT HEART CATH AND CORONARY ANGIOGRAPHY Right  05/15/2020   Procedure: Right/Left Heart cath;  Surgeon: Teodoro Spray, MD;  Location: [facility];  Service: Cardiovascular;  Laterality: Right;  . TRIGGER FINGER RELEASE      Prior to Admission medications   Medication Sig Start Date End Date Taking? Authorizing Provider  alendronate (FOSAMAX) 70 MG tablet Take 70 mg by mouth once a week.    Yes [provider]  clopidogrel (PLAVIX) 75 MG tablet Take 1 tablet (75 mg total) by mouth daily with breakfast. 05/16/20  Yes [name], [name], MD  gabapentin (NEURONTIN) 300 MG capsule Take 300 mg by mouth 2 (two) times daily.  04/30/19  Yes [provider]  insulin NPH-regular Human (NOVOLIN 70/30) (70-30) 100 UNIT/ML injection Inject 60-80 Units into the skin See admin instructions. Inject 80 units subcutaneously every morning and 60 units every afternoon   Yes [provider]  isosorbide mononitrate (IMDUR) 30 MG 24 hr tablet Take 30 mg by mouth daily.   Yes [provider]  lisinopril (ZESTRIL) 10 MG tablet Take 10 mg by mouth daily. 12/18/19  Yes [provider]  metoprolol tartrate (LOPRESSOR) 100 MG tablet Take 100 mg by mouth 2 (two) times daily.  10/02/18  Yes [provider]  pantoprazole (PROTONIX) 40 MG tablet Take 40 mg by mouth daily. 05/17/20  Yes [provider]  pravastatin (PRAVACHOL) 20 MG tablet Take 20 mg by mouth at bedtime. 05/01/20  Yes [provider]  tiotropium (SPIRIVA HANDIHALER) 18 MCG inhalation capsule Place 18 mcg into inhaler and inhale daily.  09/22/17  Yes [provider]  TRULICITY 1.5 0000000 SOPN Inject 0.5 mLs into the skin once a week. 03/21/20  Yes [provider]  albuterol (PROAIR HFA) 108 (90 Base) MCG/ACT inhaler Inhale 2 puffs into the lungs every 4 (four) hours as needed for wheezing or shortness of breath.     [provider]  amitriptyline (ELAVIL) 25 MG tablet Take 25 mg by mouth at bedtime as needed  for sleep.    [provider]  aspirin 81 MG chewable tablet Chew 1 tablet (81 mg total) by mouth daily. Patient not taking: Reported on 05/20/2020 05/16/20   Isaias Cowman, MD  diclofenac Sodium (VOLTAREN) 1 % GEL Apply 2 g topically 4 (four) times daily as needed (pain).    [provider]  Insulin Syringe-Needle U-100 (INSULIN SYRINGE 1CC/30GX5/16") 30G X 5/16" 1 ML MISC USE ONE TWICE DAILY WITH INSULIN 03/18/17   [provider]  traMADol (ULTRAM) 50 MG tablet Take 50 mg by mouth every 6 (six) hours as needed for moderate pain.    [provider]    Allergies as of 05/20/2020 - Review Complete 05/20/2020  Allergen Reaction Noted  . Invokamet [canagliflozin-metformin hcl] Other (See Comments) 08/08/2015  . Sulfa antibiotics Other (See Comments) 04/13/2013  . Aspirin Other (See Comments) 09/28/2015  . Lovastatin Rash 08/08/2015  . Penicillins Rash 04/13/2013  . Vicodin [hydrocodone-acetaminophen] Rash 04/13/2013    Family History  Problem Relation Age of Onset  . Uterine cancer Mother   . Breast cancer Mother 84  .  Seizures Father   . Stroke Father   . Diabetes Father   . COPD Father   . Colon cancer Neg Hx     Social History   Socioeconomic History  . Marital status: Married    Spouse name: Not on file  . Number of children: Not on file  . Years of education: Not on file  . Highest education level: Not on file  Occupational History  . Not on file  Tobacco Use  . Smoking status: Former Smoker    Quit date: 02/15/1985    Years since quitting: 35.2  . Smokeless tobacco: Never Used  . Tobacco comment: quit about 31 years ago  Substance and Sexual Activity  . Alcohol use: No    Alcohol/week: 0.0 standard drinks  . Drug use: No  . Sexual activity: Not on file  Other Topics Concern  . Not on file  Social History Narrative   Lives at home independently.   Social Determinants of Health   Financial Resource Strain:   .  Difficulty of Paying Living Expenses:   Food Insecurity: No Food Insecurity  . Worried About Charity fundraiser in the Last Year: Never true  . Ran Out of Food in the Last Year: Never true  Transportation Needs: No Transportation Needs  . Lack of Transportation (Medical): No  . Lack of Transportation (Non-Medical): No  Physical Activity:   . Days of Exercise per Week:   . Minutes of Exercise per Session:   Stress:   . Feeling of Stress :   Social Connections:   . Frequency of Communication with Friends and Family:   . Frequency of Social Gatherings with Friends and Family:   . Attends Religious Services:   . Active Member of Clubs or Organizations:   . Attends Archivist Meetings:   Marland Kitchen Marital Status:   Intimate Partner Violence:   . Fear of Current or Ex-Partner:   . Emotionally Abused:   Marland Kitchen Physically Abused:   . Sexually Abused:     Review of Systems: See HPI, otherwise negative ROS  Physical Exam: BP (!) 149/64   Pulse 65   Temp (!) 97.2 F (36.2 C) (Temporal)   Resp 18   Ht 5\' 3"  (1.6 m)   Wt 91.4 kg   SpO2 100%   BMI 35.68 kg/m  General:   Alert,  pleasant and cooperative in NAD Head:  Normocephalic and atraumatic. Neck:  Supple; no masses or thyromegaly. Lungs:  Clear throughout to auscultation, normal respiratory effort.    Heart:  +S1, +S2, Regular rate and rhythm, No edema. Abdomen:  Soft, nontender and nondistended. Normal bowel sounds, without guarding, and without rebound.   Neurologic:  Alert and  oriented x4;  grossly normal neurologically.  Impression/Plan: SHAKEELA BURROS is here for an colonoscopy to be performed for melena Risks, benefits, limitations, and alternatives regarding  colonoscopy have been reviewed with the patient.  Questions have been answered.  All parties agreeable.   Jonathon Bellows, MD  05/22/2020, 11:30 AM

## 2020-05-22 NOTE — Plan of Care (Signed)
  Problem: Bowel/Gastric: ?Goal: Will show no signs and symptoms of gastrointestinal bleeding ?Outcome: Not Progressing ?  ?

## 2020-05-22 NOTE — Anesthesia Postprocedure Evaluation (Signed)
Anesthesia Post Note  Patient: Joyce Potter  Procedure(s) Performed: COLONOSCOPY WITH PROPOFOL (N/A )  Patient location during evaluation: Endoscopy Anesthesia Type: General Level of consciousness: awake and alert Pain management: pain level controlled Vital Signs Assessment: post-procedure vital signs reviewed and stable Respiratory status: spontaneous breathing, nonlabored ventilation, respiratory function stable and patient connected to nasal cannula oxygen Cardiovascular status: blood pressure returned to baseline and stable Postop Assessment: no apparent nausea or vomiting Anesthetic complications: no     Last Vitals:  Vitals:   05/22/20 1056 05/22/20 1314  BP: (!) 149/64 (!) 122/51  Pulse: 65 66  Resp: 18 14  Temp: (!) 36.2 C 36.5 C  SpO2: 100% 98%    Last Pain:  Vitals:   05/22/20 1314  TempSrc: Oral  PainSc:                  Martha Clan

## 2020-05-22 NOTE — Op Note (Signed)
[facility] Patient Name: Joyce Potter Procedure Date: 05/22/2020 11:29 AM MRN: [id number] Account #: [id number] Date of Birth: [birth year] Admit Type: Inpatient Age: 74 Room: [facility] ENDO ROOM 2 Gender: Female Note Status: Finalized Procedure:             Colonoscopy Indications:           Melena Providers:             Jonathon Bellows MD, MD Referring MD:          [name], MD (Referring MD) Complications:         No immediate complications. Procedure:             Pre-Anesthesia Assessment:                        - Prior to the procedure, a History and Physical was                         performed, and patient medications, allergies and                         sensitivities were reviewed. The patient's tolerance                         of previous anesthesia was reviewed.                        - The risks and benefits of the procedure and the                         sedation options and risks were discussed with the                         patient. All questions were answered and informed                         consent was obtained.                        - Prior to the procedure, a History and Physical was                         performed, and patient medications, allergies and                         sensitivities were reviewed. The patient's tolerance                         of previous anesthesia was reviewed.                        - ASA Grade Assessment: III - A patient with severe                         systemic disease.                        After obtaining informed consent, the colonoscope was  passed under direct vision. Throughout the procedure,                         the patient's blood pressure, pulse, and oxygen                         saturations were monitored continuously. The                         Colonoscope was introduced through the anus and                         advanced to the the cecum,  identified by the                         appendiceal orifice. The colonoscopy was performed                         with ease. The patient tolerated the procedure well.                         The quality of the bowel preparation was good. Findings:      The perianal and digital rectal examinations were normal.      The entire examined colon appeared normal on direct and retroflexion       views. Impression:            - The entire examined colon is normal on direct and                         retroflexion views.                        - No specimens collected. Recommendation:        - Return patient to hospital ward for ongoing care.                        - Advance diet as tolerated.                        - No blood seeen in colon suggesting no active                         bleeding. Can restart Plavix and then watch and see if                         has new melena - if yes then needs capsule as                         inpatient otherwise can be done as outpatient Procedure Code(s):     --- Professional ---                        [phone], Colonoscopy, flexible; diagnostic, including                         collection of specimen(s) by brushing or washing, when  performed (separate procedure) Diagnosis Code(s):     --- Professional ---                        K92.1, Melena (includes Hematochezia) CPT copyright 2019 American Medical Association. All rights reserved. The codes documented in this report are preliminary and upon coder review may  be revised to meet current compliance requirements. Jonathon Bellows, MD Jonathon Bellows MD, MD 05/22/2020 11:54:34 AM This report has been signed electronically. Number of Addenda: 0 Note Initiated On: 05/22/2020 11:29 AM Scope Withdrawal Time: 0 hours 5 minutes 19 seconds  Total Procedure Duration: 0 hours 7 minutes 56 seconds  Estimated Blood Loss:  Estimated blood loss: none.      [facility]

## 2020-05-22 NOTE — Progress Notes (Signed)
Paged MD - ok to hold BP medication  (lisinipril, metoprolol, and Imdur) due to lower BP and decreased HR today.  Per pt - these medications are new to her.  Pt does have dizziness when standing

## 2020-05-22 NOTE — Progress Notes (Signed)
Inpatient Diabetes Program Recommendations  AACE/ADA: New Consensus Statement on Inpatient Glycemic Control   Target Ranges:  Prepandial:   less than 140 mg/dL      Peak postprandial:   less than 180 mg/dL (1-2 hours)      Critically ill patients:  140 - 180 mg/dL   Results for SUSANN, POKORSKI (MRN [id number]) as of 05/22/2020 08:29  Ref. Range 05/21/2020 23:58 05/22/2020 00:42 05/22/2020 04:02 05/22/2020 04:49 05/22/2020 06:27 05/22/2020 07:45  Glucose-Capillary Latest Ref Range: 70 - 99 mg/dL 40 (LL) 128 (H) 47 (L) 151 (H) 90 79  Results for ELFA, DEPASS (MRN [id number]) as of 05/22/2020 08:29  Ref. Range 05/21/2020 04:36 05/21/2020 12:37 05/21/2020 16:44 05/21/2020 20:48  Glucose-Capillary Latest Ref Range: 70 - 99 mg/dL 92 166 (H)  Novolog 2 units 115 (H)  70/30 40 units 96   Review of Glycemic Control  Diabetes history: DM2 Outpatient Diabetes medications: Trulicity 0.5 mg Qweek, 70/30 80 units QAM, 70/30 60 units QPM Current orders for Inpatient glycemic control: 70/30 60 units QAM, 70/30 40 units QPM, Novolog 0-9 units Q4H  Inpatient Diabetes Program Recommendations:   Insulin - Basal: Patient is NPO today for colonoscopy. Please decrease 70/30 to 15 units BID (to start this evening with supper if diet resumed by that time).  Thanks, Barnie Alderman, RN, MSN, CDE Diabetes Coordinator Inpatient Diabetes Program [phone] (Team Pager from 8am to 5pm)

## 2020-05-22 NOTE — Progress Notes (Signed)
Patient Joyce Potter currently 128. Previous check  was 40. PRN hypoglycemic protocol implemented. Patient alert, in bed watching television. Denies and discomfort. Will continue to monitor.

## 2020-05-22 NOTE — Progress Notes (Signed)
PROGRESS NOTE    Joyce Potter  [id number] DOB: 1946-01-06 DOA: 05/20/2020 PCP: [name], MD     Brief Narrative:  Joyce Potter is a 74 y.o. WF PMHx HTN, HLD, DM type II, stroke, depression, GERD, PUD, GI bleeding, CAD, stent placement, aortic stenosis, anemia, CAD, carotid artery stenosis, PVD,   who presents with dark stool.  Patient was recently hospitalized from 5/20-5/21 and had cardiac cath, PCI with drug-eluting stent placement.  Patient is discharged on aspirin and Plavix.  Patient states that she is only taking Plavix, not aspirin currently.  She developed black stool, and was seen by her cardiologist, Dr. Ubaldo Glassing, found to have low hemoglobin, sent to ED for further evaluation treatment.  Patient has lightheadedness, but no chest pain, shortness of breath.  Denies nausea, vomiting, diarrhea or abdominal pain.  No symptoms of UTI or unilateral weakness.  ED Course: pt was found to have hemoglobin 8.6 (10.7 on 05/16/2020), pending COVID-19 PCR, electrolytes renal function okay, temperature normal, blood pressure 131/72, heart rate 64, RR 15, oxygen saturation 99% on room air.  Patient is placed on progressive bed for observation.  Dr. Vicente Males of GI is consulted.   Subjective: 5/27 A/O x4, negative S OB, negative abdominal pain, negative nausea.  Discussed findings of colonoscopy with patient.   Assessment & Plan:   Principal Problem:   GIB (gastrointestinal bleeding) Active Problems:   GI bleed from an occult source with recurrent chronic blood loss anemia   CAD (coronary artery disease)   HTN (hypertension)   PUD (peptic ulcer disease)   HLD (hyperlipidemia)   Diabetes mellitus without complication ([place])   Anemia due to blood loss   GIB (gastrointestinal bleeding):  -Dr.Anna Kiran of GI to see patient Recent Labs  Lab 05/21/20 1008 05/21/20 1559 05/21/20 2206 05/22/20 0117 05/22/20 0738  HGB 8.7* 8.4* 8.5* 7.8* 7.7*  Stable -Transfuse for  hemoglobin<7 -5/26 s/p EGD negative findings for cause of bleeding -5/27 colonoscopy nondiagnostic for bleed see results below  -5/20 7 GI started patient on heart healthy/carb modified diet. -H/H q 6 hrs  Anemia due to blood loss: due to GIB. -on protonix gtt -See GI bleed  CAD (coronary artery disease):  -S/of DES recently.  Patient is supposed to take both aspirin and Plavix, but she only takes Plavix currently. -5/27 restart Plavix 75 mg daily per GI recommendation -Metoprolol, pravastatin, Imdur  HTN:  -Continue home medications: Metoprolol, lisinopril hydralazine prn  PUD (peptic ulcer disease) -on IV protonix  HLD (hyperlipidemia): -Pravastatin  Diabetes mellitus without complication (HCC) -Sliding scale insulin -Decrease NPH 70/30 insulin from 80-60 to 60-40 units twice daily    DVT prophylaxis: SCD Code Status: Full Family Communication:  Disposition Plan:  Status is: Inpatient    Dispo: The patient is from: Home              Anticipated d/c is to: Home              Anticipated d/c date is: 5/28 if patient does not rebleed overnight              Patient currently unstable      Consultants:  GI   Procedures/Significant Events:  5/26 EGD; negative findings 5/27 colonoscopy entire Colon Normal.  Recommend restarting Plavix and if rebleed will perform capsule study   I have personally reviewed and interpreted all radiology studies and my findings are as above.  VENTILATOR SETTINGS:    Cultures   Antimicrobials:  Devices    LINES / TUBES:      Continuous Infusions: . sodium chloride 75 mL/hr at 05/21/20 1947  . sodium chloride    . pantoprozole (PROTONIX) infusion 8 mg/hr (05/22/20 0127)     Objective: Vitals:   05/21/20 1642 05/21/20 2010 05/22/20 0405 05/22/20 0745  BP: (!) 121/56 117/78 (!) 122/55 (!) 127/53  Pulse: 60 77 (!) 59 60  Resp: 20   19  Temp: 98.1 F (36.7 C) 98 F (36.7 C) (!) 97.5 F (36.4 C) 97.8  F (36.6 C)  TempSrc: Oral Oral Oral Oral  SpO2: 99% 97% 95% 98%  Weight:   92.9 kg   Height:        Intake/Output Summary (Last 24 hours) at 05/22/2020 0934 Last data filed at 05/22/2020 0457 Gross per 24 hour  Intake -  Output 0 ml  Net 0 ml   Filed Weights   05/20/20 1842 05/21/20 0438 05/22/20 0405  Weight: 95.4 kg 91.4 kg 92.9 kg    Examination:  General: A/O x4, no acute respiratory distress Eyes: negative scleral hemorrhage, negative anisocoria, negative icterus ENT: Negative Runny nose, negative gingival bleeding, Neck:  Negative scars, masses, torticollis, lymphadenopathy, JVD Lungs: Clear to auscultation bilaterally without wheezes or crackles Cardiovascular: Regular rate and rhythm without murmur gallop or rub normal S1 and S2 Abdomen: OBESE, negative abdominal pain, nondistended, positive soft, bowel sounds, no rebound, no ascites, no appreciable mass Extremities: No significant cyanosis, clubbing, or edema bilateral lower extremities Skin: Negative rashes, lesions, ulcers Psychiatric:  Negative depression, negative anxiety, negative fatigue, negative mania  Central nervous system:  Cranial nerves II through XII intact, tongue/uvula midline, all extremities muscle strength 5/5, sensation intact throughout, negative dysarthria, negative expressive aphasia, negative receptive aphasia.  .     Data Reviewed: Care during the described time interval was provided by me .  I have reviewed this patient's available data, including medical history, events of note, physical examination, and all test results as part of my evaluation.  CBC: Recent Labs  Lab 05/21/20 1008 05/21/20 1559 05/21/20 2206 05/22/20 0117 05/22/20 0738  WBC 3.7* 4.9 5.5 5.1 4.2  HGB 8.7* 8.4* 8.5* 7.8* 7.7*  HCT 28.3* 26.2* 26.4* 24.2* 24.2*  MCV 86.0 83.7 83.0 83.2 83.4  PLT 276 251 286 267 [id number]   Basic Metabolic Panel: Recent Labs  Lab 05/16/20 0604 05/20/20 1054 05/21/20 1008 05/22/20  0117  NA 139 139 143 140  K 4.6 4.2 4.5 3.9  CL 106 108 110 108  CO2 27 25 25 25   GLUCOSE 121* 171* 110* 96  BUN 18 31* 16 11  CREATININE 0.80 0.87 0.86 0.81  CALCIUM 8.9 8.6* 8.7* 8.3*  MG  --   --   --  1.9  PHOS  --   --   --  2.9   GFR: Estimated Creatinine Clearance: 68 mL/min (by C-G formula based on SCr of 0.81 mg/dL). Liver Function Tests: Recent Labs  Lab 05/20/20 1054 05/22/20 0117  AST 10* 20  ALT 19 19  ALKPHOS 66 59  BILITOT 0.6 0.6  PROT 6.7 6.4*  ALBUMIN 3.6 3.4*   No results for input(s): LIPASE, AMYLASE in the last 168 hours. No results for input(s): AMMONIA in the last 168 hours. Coagulation Profile: Recent Labs  Lab 05/20/20 1354  INR 1.0   Cardiac Enzymes: No results for input(s): CKTOTAL, CKMB, CKMBINDEX, TROPONINI in the last 168 hours. BNP (last 3 results) No results for input(s): PROBNP in the  last 8760 hours. HbA1C: Recent Labs    05/21/20 1008  HGBA1C 7.3*   CBG: Recent Labs  Lab 05/22/20 0042 05/22/20 0402 05/22/20 0449 05/22/20 0627 05/22/20 0745  GLUCAP 128* 47* 151* 90 79   Lipid Profile: Recent Labs    05/21/20 1008  CHOL 130  HDL 35*  LDLCALC 79  TRIG 81  CHOLHDL 3.7   Thyroid Function Tests: No results for input(s): TSH, T4TOTAL, FREET4, T3FREE, THYROIDAB in the last 72 hours. Anemia Panel: No results for input(s): VITAMINB12, FOLATE, FERRITIN, TIBC, IRON, RETICCTPCT in the last 72 hours. Sepsis Labs: No results for input(s): PROCALCITON, LATICACIDVEN in the last 168 hours.  Recent Results (from the past 240 hour(s))  SARS CORONAVIRUS 2 (TAT 6-24 HRS) Nasopharyngeal Nasopharyngeal Swab     Status: None   Collection Time: 05/13/20 12:13 PM   Specimen: Nasopharyngeal Swab  Result Value Ref Range Status   SARS Coronavirus 2 NEGATIVE NEGATIVE Final    Comment: (NOTE) SARS-CoV-2 target nucleic acids are NOT DETECTED. The SARS-CoV-2 RNA is generally detectable in upper and lower respiratory specimens during  the acute phase of infection. Negative results do not preclude SARS-CoV-2 infection, do not rule out co-infections with other pathogens, and should not be used as the sole basis for treatment or other patient management decisions. Negative results must be combined with clinical observations, patient history, and epidemiological information. The expected result is Negative. Fact Sheet for Patients: SugarRoll.be Fact Sheet for Healthcare Providers: https://www.woods-mathews.com/ This test is not yet approved or cleared by the [place] FDA and  has been authorized for detection and/or diagnosis of SARS-CoV-2 by FDA under an Emergency Use Authorization (EUA). This EUA will remain  in effect (meaning this test can be used) for the duration of the COVID-19 declaration under Section 56 4(b)(1) of the Act, 21 U.S.C. section 360bbb-3(b)(1), unless the authorization is terminated or revoked sooner. Performed at [facility], [place] [address], [place], [place] [address]   SARS Coronavirus 2 by RT PCR (hospital order, performed in [facility] hospital lab) Nasopharyngeal Nasopharyngeal Swab     Status: None   Collection Time: 05/20/20  1:46 PM   Specimen: Nasopharyngeal Swab  Result Value Ref Range Status   SARS Coronavirus 2 NEGATIVE NEGATIVE Final    Comment: (NOTE) SARS-CoV-2 target nucleic acids are NOT DETECTED. The SARS-CoV-2 RNA is generally detectable in upper and lower respiratory specimens during the acute phase of infection. The lowest concentration of SARS-CoV-2 viral copies this assay can detect is 250 copies / mL. A negative result does not preclude SARS-CoV-2 infection and should not be used as the sole basis for treatment or other patient management decisions.  A negative result may occur with improper specimen collection / handling, submission of specimen other than nasopharyngeal swab, presence of viral mutation(s) within the  areas targeted by this assay, and inadequate number of viral copies (<250 copies / mL). A negative result must be combined with clinical observations, patient history, and epidemiological information. Fact Sheet for Patients:   StrictlyIdeas.no Fact Sheet for Healthcare Providers: BankingDealers.co.za This test is not yet approved or cleared  by the [place] FDA and has been authorized for detection and/or diagnosis of SARS-CoV-2 by FDA under an Emergency Use Authorization (EUA).  This EUA will remain in effect (meaning this test can be used) for the duration of the COVID-19 declaration under Section 564(b)(1) of the Act, 21 U.S.C. section 360bbb-3(b)(1), unless the authorization is terminated or revoked sooner. Performed at  [facility], [address], [place], [place] [address]          Radiology Studies: No results found.      Scheduled Meds: . gabapentin  300 mg Oral BID  . insulin aspart  0-9 Units Subcutaneous Q4H  . insulin aspart protamine- aspart  60 Units Subcutaneous Q breakfast   And  . insulin aspart protamine- aspart  40 Units Subcutaneous Q supper  . isosorbide mononitrate  30 mg Oral Daily  . lisinopril  10 mg Oral Daily  . metoprolol tartrate  100 mg Oral BID  . pravastatin  20 mg Oral QHS  . tiotropium  18 mcg Inhalation Daily   Continuous Infusions: . sodium chloride 75 mL/hr at 05/21/20 1947  . sodium chloride    . pantoprozole (PROTONIX) infusion 8 mg/hr (05/22/20 0127)     LOS: 1 day    Time spent:40 min    WOODS, Geraldo Docker, MD [facility] Pager [phone]  If 7PM-7AM, please contact night-coverage [email or web address] Password [facility] 05/22/2020, 9:34 AM

## 2020-05-22 NOTE — Transfer of Care (Signed)
Immediate Anesthesia Transfer of Care Note  Patient: Joyce Potter  Procedure(s) Performed: COLONOSCOPY WITH PROPOFOL (N/A )  Patient Location: Endoscopy Unit  Anesthesia Type:General  Level of Consciousness: awake, alert  and oriented  Airway & Oxygen Therapy: Patient Spontanous Breathing and Patient connected to nasal cannula oxygen  Post-op Assessment: Report given to RN and Post -op Vital signs reviewed and stable  Post vital signs: Reviewed and stable  Last Vitals:  Vitals Value Taken Time  BP    Temp    Pulse    Resp    SpO2      Last Pain:  Vitals:   05/22/20 1056  TempSrc: Temporal  PainSc: 0-No pain         Complications: No apparent anesthesia complications

## 2020-05-23 ENCOUNTER — Encounter: Payer: Self-pay | Admitting: *Deleted

## 2020-05-23 DIAGNOSIS — K922 Gastrointestinal hemorrhage, unspecified: Secondary | ICD-10-CM

## 2020-05-23 DIAGNOSIS — E1129 Type 2 diabetes mellitus with other diabetic kidney complication: Secondary | ICD-10-CM

## 2020-05-23 DIAGNOSIS — E1165 Type 2 diabetes mellitus with hyperglycemia: Secondary | ICD-10-CM

## 2020-05-23 LAB — COMPREHENSIVE METABOLIC PANEL
ALT: 16 U/L (ref 0–44)
AST: 19 U/L (ref 15–41)
Albumin: 3.2 g/dL — ABNORMAL LOW (ref 3.5–5.0)
Alkaline Phosphatase: 55 U/L (ref 38–126)
Anion gap: 6 (ref 5–15)
BUN: 9 mg/dL (ref 8–23)
CO2: 26 mmol/L (ref 22–32)
Calcium: 8.2 mg/dL — ABNORMAL LOW (ref 8.9–10.3)
Chloride: 109 mmol/L (ref 98–111)
Creatinine, Ser: 0.85 mg/dL (ref 0.44–1.00)
GFR calc Af Amer: >60 mL/min (ref >60)
GFR calc non Af Amer: >60 mL/min (ref >60)
Glucose, Bld: 96 mg/dL (ref 70–99)
Potassium: 3.8 mmol/L (ref 3.5–5.1)
Sodium: 141 mmol/L (ref 135–145)
Total Bilirubin: 0.5 mg/dL (ref 0.3–1.2)
Total Protein: 5.8 g/dL — ABNORMAL LOW (ref 6.5–8.1)

## 2020-05-23 LAB — HEMOGLOBIN AND HEMATOCRIT, BLOOD
HCT: 23.6 % — ABNORMAL LOW (ref 36.0–46.0)
HCT: 25.4 % — ABNORMAL LOW (ref 36.0–46.0)
Hemoglobin: 7.7 g/dL — ABNORMAL LOW (ref 12.0–15.0)
Hemoglobin: 7.8 g/dL — ABNORMAL LOW (ref 12.0–15.0)

## 2020-05-23 LAB — GLUCOSE, CAPILLARY
Glucose-Capillary: 103 mg/dL — ABNORMAL HIGH (ref 70–99)
Glucose-Capillary: 69 mg/dL — ABNORMAL LOW (ref 70–99)
Glucose-Capillary: 74 mg/dL (ref 70–99)
Glucose-Capillary: 86 mg/dL (ref 70–99)
Glucose-Capillary: 88 mg/dL (ref 70–99)

## 2020-05-23 LAB — PHOSPHORUS: Phosphorus: 3.7 mg/dL (ref 2.5–4.6)

## 2020-05-23 LAB — MAGNESIUM: Magnesium: 1.9 mg/dL (ref 1.7–2.4)

## 2020-05-23 NOTE — Discharge Summary (Signed)
Physician Discharge Summary  Joyce Potter [id number] DOB: 1946-02-03 DOA: 05/20/2020  PCP: Joyce Maltese, MD  Admit date: 05/20/2020 Discharge date: 05/23/2020  Time spent: 30 minutes  Recommendations for Outpatient Follow-up:  GIB (gastrointestinal bleeding): -Dr.Anna Potter of GI to see patient Recent Labs  Lab 05/22/20 0738 05/22/20 1431 05/22/20 1854 05/23/20 0131 05/23/20 0719  HGB 7.7* 7.7* 8.1* 7.7* 7.8*  Stable -Transfuse for hemoglobin<7 -5/26 s/p EGD negative findings for cause of bleeding -5/27 colonoscopy nondiagnostic for bleed see results below  -5/27 GI started patient on heart healthy/carb modified diet.  Anemia due to blood loss:  -Due to GIB. -Protonix 40 mg daily -See GI bleed  CAD (coronary artery disease):  -S/of DES recently.Patient is supposed to take both aspirin and Plavix, but she only takes Plavix currently. -5/27 restart Plavix 75 mg daily per GI recommendation  HTN:  -Imdur 30 mg daily -Lisinopril 10 mg daily -Metoprolol 100 mg BID   PUD (peptic ulcer disease) -See anemia due to blood loss  HLD (hyperlipidemia): -Pravastatin  Diabetes mellitus uncontrolled without complication ([place]) -99991111 hemoglobin A1c= 7.3 -Restart home regimen   Discharge Diagnoses:  Principal Problem:   GIB (gastrointestinal bleeding) Active Problems:   GI bleed from an occult source with recurrent chronic blood loss anemia   CAD (coronary artery disease)   HTN (hypertension)   PUD (peptic ulcer disease)   HLD (hyperlipidemia)   Diabetes mellitus without complication ([place])   Anemia due to blood loss   Discharge Condition: Stable  Diet recommendation: Heart healthy/carb modified  Filed Weights   05/22/20 0405 05/22/20 1056 05/23/20 0459  Weight: 92.9 kg 91.4 kg 90.9 kg    History of present illness:  Joyce Potter a 74 y.o.WF PMHx HTN, HLD, DM type II, stroke, depression, GERD, PUD, GI bleeding, CAD, stent placement, aortic  stenosis, anemia, CAD, carotid artery stenosis, PVD,   who presents with dark stool.  Patient was recently hospitalized from 5/20-5/21 and had cardiac cath, PCI withdrug-eluting stent placement. Patient is discharged on aspirin and Plavix. Patient states that she is only taking Plavix, not aspirin currently. She developed black stool, and was seen by her cardiologist, Dr. Ubaldo Potter, found to have low hemoglobin, sent to ED for further evaluation treatment. Patient has lightheadedness, but no chest pain, shortness of breath. Denies nausea,vomiting, diarrhea or abdominal pain. No symptoms of UTI or unilateral weakness.  ED Course:pt was found to have hemoglobin 8.6 (10.7 on 05/16/2020), pending COVID-19 PCR, electrolytes renal function okay, temperature normal, blood pressure 131/72, heart rate 64, RR 15, oxygen saturation 99% on room air. Patient is placed on progressive bed for observation. Dr. Vicente Potter of GI is consulted.   Hospital Course:  See above  Procedures: 5/26 EGD; negative findings 5/27 colonoscopy entire Colon Normal.  Recommend restarting Plavix and if rebleed will perform capsule study  Consultations: GI      Discharge Exam: Vitals:   05/22/20 1937 05/23/20 0459 05/23/20 0736 05/23/20 0910  BP: 133/71 (!) 146/68 (!) 137/58   Pulse: 76 61 (!) 57 66  Resp: 16 18 19    Temp: 98 F (36.7 C) 98.6 F (37 C) 97.6 F (36.4 C)   TempSrc: Oral Oral    SpO2: 98% 99% 96%   Weight:  90.9 kg    Height:        General: A/O x4, no acute respiratory distress Eyes: negative scleral hemorrhage, negative anisocoria, negative icterus ENT: Negative Runny nose, negative gingival bleeding, Neck:  Negative scars, masses,  torticollis, lymphadenopathy, JVD Lungs: Clear to auscultation bilaterally without wheezes or crackles Cardiovascular: Regular rate and rhythm without murmur gallop or rub normal S1 and S2 Abdomen: OBESE, negative abdominal pain, nondistended, positive soft,  bowel sounds, no rebound, no ascites, no appreciable mass   Discharge Instructions   Allergies as of 05/23/2020      Reactions   Invokamet [canagliflozin-metformin Hcl] Other (See Comments)   Yeast infection   Sulfa Antibiotics Other (See Comments)   "Got Drunk"   Aspirin Other (See Comments)   ulcers   Lovastatin Rash   Penicillins Rash   Has patient had a PCN reaction causing immediate rash, facial/tongue/throat swelling, SOB or lightheadedness with hypotension:Yes Has patient had a PCN reaction causing severe rash involving mucus membranes or skin necrosis:all over body Has patient had a PCN reaction that required hospitalization: No Has patient had a PCN reaction occurring within the last 10 years: No If all of the above answers are "NO", then may proceed with Cephalosporin use.   Vicodin [hydrocodone-acetaminophen] Rash      Medication List    STOP taking these medications   aspirin 81 MG chewable tablet   Spiriva HandiHaler 18 MCG inhalation capsule Generic drug: tiotropium     TAKE these medications   alendronate 70 MG tablet Commonly known as: FOSAMAX Take 70 mg by mouth once a week.   amitriptyline 25 MG tablet Commonly known as: ELAVIL Take 25 mg by mouth at bedtime as needed for sleep.   clopidogrel 75 MG tablet Commonly known as: PLAVIX Take 1 tablet (75 mg total) by mouth daily with breakfast.   diclofenac Sodium 1 % Gel Commonly known as: VOLTAREN Apply 2 g topically 4 (four) times daily as needed (pain).   gabapentin 300 MG capsule Commonly known as: NEURONTIN Take 300 mg by mouth 2 (two) times daily.   insulin NPH-regular Human (70-30) 100 UNIT/ML injection Inject 60-80 Units into the skin See admin instructions. Inject 80 units subcutaneously every morning and 60 units every afternoon   INSULIN SYRINGE 1CC/30GX5/16" 30G X 5/16" 1 ML Misc USE ONE TWICE DAILY WITH INSULIN   isosorbide mononitrate 30 MG 24 hr tablet Commonly known as:  IMDUR Take 30 mg by mouth daily.   lisinopril 10 MG tablet Commonly known as: ZESTRIL Take 10 mg by mouth daily.   metoprolol tartrate 100 MG tablet Commonly known as: LOPRESSOR Take 100 mg by mouth 2 (two) times daily.   pantoprazole 40 MG tablet Commonly known as: PROTONIX Take 40 mg by mouth daily.   pravastatin 20 MG tablet Commonly known as: PRAVACHOL Take 20 mg by mouth at bedtime.   ProAir HFA 108 (90 Base) MCG/ACT inhaler Generic drug: albuterol Inhale 2 puffs into the lungs every 4 (four) hours as needed for wheezing or shortness of breath.   traMADol 50 MG tablet Commonly known as: ULTRAM Take 50 mg by mouth every 6 (six) hours as needed for moderate pain.   Trulicity 1.5 0000000 Sopn Generic drug: Dulaglutide Inject 0.5 mLs into the skin once a week.      Allergies  Allergen Reactions  . Invokamet [Canagliflozin-Metformin Hcl] Other (See Comments)    Yeast infection  . Sulfa Antibiotics Other (See Comments)    "Got Drunk"  . Aspirin Other (See Comments)    ulcers  . Lovastatin Rash  . Penicillins Rash    Has patient had a PCN reaction causing immediate rash, facial/tongue/throat swelling, SOB or lightheadedness with hypotension:Yes Has patient had a PCN  reaction causing severe rash involving mucus membranes or skin necrosis:all over body Has patient had a PCN reaction that required hospitalization: No Has patient had a PCN reaction occurring within the last 10 years: No If all of the above answers are "NO", then may proceed with Cephalosporin use.   . Vicodin [Hydrocodone-Acetaminophen] Rash      The results of significant diagnostics from this hospitalization (including imaging, microbiology, ancillary and laboratory) are listed below for reference.    Significant Diagnostic Studies: CARDIAC CATHETERIZATION  Result Date: 05/15/2020  Ost 1st Diag lesion is 60% stenosed.  1st Diag-1 lesion is 60% stenosed.  1st Diag-2 lesion is 75% stenosed.   Prox LAD lesion is 55% stenosed.  Prox RCA lesion is 95% stenosed.  Previously placed Prox Cx to Mid Cx stent (unknown type) is widely patent.  Prox Cx lesion is 50% stenosed.  The left ventricular systolic function is normal.  The left ventricular ejection fraction is 55-65% by visual estimate.  There is trivial (1+) mitral regurgitation.  There is moderate aortic valve stenosis.  LV end diastolic pressure is normal.  LM-normal LAD-insignificant disease LCx-patent stent in lcx RCA-95% proximal stenosis Moderate as PCI of rca Medical management of as   CARDIAC CATHETERIZATION  Result Date: 05/15/2020  Prox RCA-1 lesion is 60% stenosed.  Prox RCA-2 lesion is 95% stenosed.  A drug-eluting stent was successfully placed using a STENT RESOLUTE ONYX 2.5X26.  Post intervention, there is a 0% residual stenosis.  Post intervention, there is a 0% residual stenosis.  1.  One-vessel coronary artery disease with high-grade stenosis proximal/mid RCA 2.  Successful PCI with DES proximal/mid RCA   MM 3D SCREEN BREAST BILATERAL  Result Date: 05/08/2020 CLINICAL DATA:  Screening. EXAM: DIGITAL SCREENING BILATERAL MAMMOGRAM WITH TOMO AND CAD COMPARISON:  Previous exam(s). ACR Breast Density Category b: There are scattered areas of fibroglandular density. FINDINGS: There are no findings suspicious for malignancy. Images were processed with CAD. IMPRESSION: No mammographic evidence of malignancy. A result letter of this screening mammogram will be mailed directly to the patient. RECOMMENDATION: Screening mammogram in one year. (Code:SM-B-01Y) BI-RADS CATEGORY  1: Negative. Electronically Signed   By: Everlean Alstrom M.D.   On: 05/08/2020 14:18    Microbiology: Recent Results (from the past 240 hour(s))  SARS CORONAVIRUS 2 (TAT 6-24 HRS) Nasopharyngeal Nasopharyngeal Swab     Status: None   Collection Time: 05/13/20 12:13 PM   Specimen: Nasopharyngeal Swab  Result Value Ref Range Status   SARS Coronavirus 2  NEGATIVE NEGATIVE Final    Comment: (NOTE) SARS-CoV-2 target nucleic acids are NOT DETECTED. The SARS-CoV-2 RNA is generally detectable in upper and lower respiratory specimens during the acute phase of infection. Negative results do not preclude SARS-CoV-2 infection, do not rule out co-infections with other pathogens, and should not be used as the sole basis for treatment or other patient management decisions. Negative results must be combined with clinical observations, patient history, and epidemiological information. The expected result is Negative. Fact Sheet for Patients: SugarRoll.be Fact Sheet for Healthcare Providers: https://www.[employer]-mathews.com/ This test is not yet approved or cleared by the [place] FDA and  has been authorized for detection and/or diagnosis of SARS-CoV-2 by FDA under an Emergency Use Authorization (EUA). This EUA will remain  in effect (meaning this test can be used) for the duration of the COVID-19 declaration under Section 56 4(b)(1) of the Act, 21 U.S.C. section 360bbb-3(b)(1), unless the authorization is terminated or revoked sooner. Performed at [facility]  [facility]ospital Lab, [place] [address] [place], [place] [address]   SARS Coronavirus 2 by RT PCR (hospital order, performed in [facility] hospital lab) Nasopharyngeal Nasopharyngeal Swab     Status: None   Collection Time: 05/20/20  1:46 PM   Specimen: Nasopharyngeal Swab  Result Value Ref Range Status   SARS Coronavirus 2 NEGATIVE NEGATIVE Final    Comment: (NOTE) SARS-CoV-2 target nucleic acids are NOT DETECTED. The SARS-CoV-2 RNA is generally detectable in upper and lower respiratory specimens during the acute phase of infection. The lowest concentration of SARS-CoV-2 viral copies this assay can detect is 250 copies / mL. A negative result does not preclude SARS-CoV-2 infection and should not be used as the sole basis for treatment or other patient  management decisions.  A negative result may occur with improper specimen collection / handling, submission of specimen other than nasopharyngeal swab, presence of viral mutation(s) within the areas targeted by this assay, and inadequate number of viral copies (<250 copies / mL). A negative result must be combined with clinical observations, patient history, and epidemiological information. Fact Sheet for Patients:   StrictlyIdeas.no Fact Sheet for Healthcare Providers: BankingDealers.co.za This test is not yet approved or cleared  by the [place] FDA and has been authorized for detection and/or diagnosis of SARS-CoV-2 by FDA under an Emergency Use Authorization (EUA).  This EUA will remain in effect (meaning this test can be used) for the duration of the COVID-19 declaration under Section 564(b)(1) of the Act, 21 U.S.C. section 360bbb-3(b)(1), unless the authorization is terminated or revoked sooner. Performed at [facility], [address] [place], [place] [address]      Labs: Basic Metabolic Panel: Recent Labs  Lab 05/20/20 1054 05/21/20 1008 05/22/20 0117 05/23/20 0131  NA 139 143 140 141  K 4.2 4.5 3.9 3.8  CL 108 110 108 109  CO2 25 25 25 26   GLUCOSE 171* 110* 96 96  BUN 31* 16 11 9   CREATININE 0.87 0.86 0.81 0.85  CALCIUM 8.6* 8.7* 8.3* 8.2*  MG  --   --  1.9 1.9  PHOS  --   --  2.9 3.7   Liver Function Tests: Recent Labs  Lab 05/20/20 1054 05/22/20 0117 05/23/20 0131  AST 10* 20 19  ALT 19 19 16   ALKPHOS 66 59 55  BILITOT 0.6 0.6 0.5  PROT 6.7 6.4* 5.8*  ALBUMIN 3.6 3.4* 3.2*   No results for input(s): LIPASE, AMYLASE in the last 168 hours. No results for input(s): AMMONIA in the last 168 hours. CBC: Recent Labs  Lab 05/21/20 1008 05/21/20 1008 05/21/20 1559 05/21/20 1559 05/21/20 2206 05/21/20 2206 05/22/20 0117 05/22/20 0117 05/22/20 0738 05/22/20 1431 05/22/20 1854  05/23/20 0131 05/23/20 0719  WBC 3.7*  --  4.9  --  5.5  --  5.1  --  4.2  --   --   --   --   HGB 8.7*   < > 8.4*   < > 8.5*   < > 7.8*   < > 7.7* 7.7* 8.1* 7.7* 7.8*  HCT 28.3*   < > 26.2*   < > 26.4*   < > 24.2*   < > 24.2* 24.6* 26.2* 23.6* 25.4*  MCV 86.0  --  83.7  --  83.0  --  83.2  --  83.4  --   --   --   --   PLT 276  --  251  --  286  --  267  --  244  --   --   --   --    < > = values in this interval not displayed.   Cardiac Enzymes: No results for input(s): CKTOTAL, CKMB, CKMBINDEX, TROPONINI in the last 168 hours. BNP: BNP (last 3 results) No results for input(s): BNP in the last 8760 hours.  ProBNP (last 3 results) No results for input(s): PROBNP in the last 8760 hours.  CBG: Recent Labs  Lab 05/22/20 1704 05/22/20 2056 05/23/20 0019 05/23/20 0453 05/23/20 0735  GLUCAP 197* 124* 88 103* 86       Signed:  Dia Crawford, MD [facility] [phone] pager

## 2020-05-23 NOTE — Care Management Important Message (Signed)
Important Message  Patient Details  Name: Joyce Potter MRN: [id number] Date of Birth: 12/22/1946   Medicare Important Message Given:  Yes  Initial Medicare IM given by Patient Access Associate on 05/22/2020 at 10:20am.     Dannette Barbara 05/23/2020, 9:20 AM

## 2020-05-27 DIAGNOSIS — I350 Nonrheumatic aortic (valve) stenosis: Secondary | ICD-10-CM | POA: Diagnosis not present

## 2020-05-27 DIAGNOSIS — I6523 Occlusion and stenosis of bilateral carotid arteries: Secondary | ICD-10-CM | POA: Diagnosis not present

## 2020-05-27 DIAGNOSIS — I2510 Atherosclerotic heart disease of native coronary artery without angina pectoris: Secondary | ICD-10-CM | POA: Diagnosis not present

## 2020-05-27 DIAGNOSIS — I10 Essential (primary) hypertension: Secondary | ICD-10-CM | POA: Diagnosis not present

## 2020-05-28 DIAGNOSIS — R69 Illness, unspecified: Secondary | ICD-10-CM | POA: Diagnosis not present

## 2020-05-29 ENCOUNTER — Other Ambulatory Visit: Payer: Self-pay | Admitting: *Deleted

## 2020-05-29 DIAGNOSIS — E782 Mixed hyperlipidemia: Secondary | ICD-10-CM | POA: Diagnosis not present

## 2020-05-29 DIAGNOSIS — I10 Essential (primary) hypertension: Secondary | ICD-10-CM | POA: Diagnosis not present

## 2020-05-29 DIAGNOSIS — E118 Type 2 diabetes mellitus with unspecified complications: Secondary | ICD-10-CM | POA: Diagnosis not present

## 2020-05-29 DIAGNOSIS — G4733 Obstructive sleep apnea (adult) (pediatric): Secondary | ICD-10-CM | POA: Diagnosis not present

## 2020-05-29 DIAGNOSIS — Z23 Encounter for immunization: Secondary | ICD-10-CM | POA: Diagnosis not present

## 2020-05-29 DIAGNOSIS — I2510 Atherosclerotic heart disease of native coronary artery without angina pectoris: Secondary | ICD-10-CM | POA: Diagnosis not present

## 2020-05-29 NOTE — Patient Outreach (Signed)
[facility]) Care Management  05/29/2020  Joyce Potter 03/14/1946 [id number]   RN Health Coach Discipline closure. Patient has been admitted to hospital. Patient will be followed by Complex Care Coordinator once discharged.  Plan: RN Health Coach Discipline Closure Discipline Closure letter sent to PCP  [place] Management [phone]

## 2020-05-30 ENCOUNTER — Other Ambulatory Visit: Payer: Self-pay | Admitting: Pharmacist

## 2020-05-31 NOTE — Patient Outreach (Signed)
[place] [facility]) [place] Department  05/31/2020  LINDY GARCZYNSKI 02-14-1946 [id number]  Patient was called regarding medication assistance. HIPPA identifiers were obtained.  Patient is a 74 year old female with multiple medical conditions including but not limited to:  Aortic stenosis, CAD, carotid stenosis, type 2 diabetes, hyperlipidemia, obesity, history of stroke and osteoporosis.  Patient was recently hospitalized for GI bleed.  Patient was referred for medication assistance with Lantus and Spiriva.    Patient's medications were reviewed via telephone:  Medications Reviewed Today    Reviewed by [name], [facility] (Pharmacist) on 05/30/20 at [place] List Status: <None>  Medication Order Taking? Sig Documenting Provider Last Dose Status Informant  albuterol (PROAIR HFA) 108 (90 Base) MCG/ACT inhaler [id number] Yes Inhale 2 puffs into the lungs every 4 (four) hours as needed for wheezing or shortness of breath.  [provider] Taking Active Self  alendronate (FOSAMAX) 70 MG tablet [id number] Yes Take 70 mg by mouth once a week.  [provider] Taking Active Self           Med Note [name]   Fri May 30, 2020  4:03 PM)    amitriptyline (ELAVIL) 25 MG tablet [id number] Yes Take 25 mg by mouth at bedtime as needed for sleep. [provider] Taking Active Self  clopidogrel (PLAVIX) 75 MG tablet [id number] Yes Take 1 tablet (75 mg total) by mouth daily with breakfast. [name], [name], MD Taking Active Self  diclofenac Sodium (VOLTAREN) 1 % GEL [id number] No Apply 2 g topically 4 (four) times daily as needed (pain). [provider] Not Taking Active Self  doxycycline (VIBRA-TABS) 100 MG tablet [id number] Yes Take 1 tablet by mouth in the morning and at bedtime. [provider] Taking Active   gabapentin (NEURONTIN) 300 MG capsule [id number] Yes Take 300 mg by mouth 2 (two) times daily.  [provider] Taking  Active Self  glucose blood (ONETOUCH VERIO) test strip [id number] Yes USE TO CHECK BLOOD SUGAR THREE TIMES DAILY AS INSTRUCTED [provider] Taking Active   insulin NPH-regular Human (NOVOLIN 70/30) (70-30) 100 UNIT/ML injection [id number] Yes Inject 60-80 Units into the skin See admin instructions. Inject 80 units subcutaneously every morning and 60 units every afternoon [provider] Taking Active Self  Insulin Syringe-Needle U-100 (INSULIN SYRINGE 1CC/30GX5/16") 30G X 5/16" 1 ML MISC [id number] Yes USE ONE TWICE DAILY WITH INSULIN [provider] Taking Active Self  isosorbide mononitrate (IMDUR) 30 MG 24 hr tablet [id number] Yes Take 30 mg by mouth daily. [provider] Taking Active Self  lisinopril (ZESTRIL) 10 MG tablet [id number] Yes Take 10 mg by mouth daily. [provider] Taking Active Self  metoprolol tartrate (LOPRESSOR) 100 MG tablet [id number] Yes Take 100 mg by mouth 2 (two) times daily.  [provider] Taking Active Self           Med Note Nat Christen   Wed May 07, 2020 10:03 AM) Pt takes 100 mg tablet and 50 mg tablet for a dose of 150 mg bid  pantoprazole (PROTONIX) 40 MG tablet [id number] Yes Take 40 mg by mouth daily. [provider] Taking Active Self  pravastatin (PRAVACHOL) 20 MG tablet [id number] Yes Take 20 mg by mouth at bedtime. [provider] Taking Active Self  traMADol (ULTRAM) 50 MG tablet [id number] Yes Take 50 mg by mouth every 6 (six) hours as needed for moderate pain. [provider] Taking Active Self  TRULICITY 1.5 HY/0.7PX SOPN [id number] Yes Inject 0.5 mLs into the skin once a week. [provider] Taking Active Self           Med Note Catalina Gravel May 20, 2020  2:08 PM) Saturday   Med List Note [name], [place] 05/20/20 1417): Patient stated she did not take her medications this morning as she had to report here at 0730          Findings: Spiriva was discontinued at discharge. Patient said her provider was going to find her something in the same drug class that would be cheaper.  Checked the patient's formulary.  Anoro is the preferred product and would carry a $47 copay.  Patient was discharged on Novolin 70/30. (Patient said this was due to cost and she would prefer to go back on Lantus. Lantus. Lantus is non-formulary on the patient's insurance. Basaglar and Tyler Aas are both tier 3 medications and carry a $47 copay.  Trulicity is preferred.  Trulicity and Engineer, agricultural can be obtained through [place] can be obtained through [employer].  Patient is over income for LIS/Extra Help  She communicated understanding needing to provide the necessary financial documentation to complete patient assistance applications.  Plan: Contact Dr Gabriel Carina (Endocrinologist) about what Novolin 70/30 vs basal insulin. Contact Dr. Bartholome Bill (Cardiology) about Spiriva Once the provider's respond, send patient necessary applications.  [name], PharmD, [place] Clinical Pharmacist [phone]

## 2020-06-02 ENCOUNTER — Other Ambulatory Visit: Payer: Self-pay | Admitting: *Deleted

## 2020-06-02 ENCOUNTER — Ambulatory Visit: Payer: Self-pay | Admitting: Pharmacist

## 2020-06-02 DIAGNOSIS — E113393 Type 2 diabetes mellitus with moderate nonproliferative diabetic retinopathy without macular edema, bilateral: Secondary | ICD-10-CM | POA: Diagnosis not present

## 2020-06-02 NOTE — Patient Outreach (Signed)
[facility] [facility]) Care Management  06/02/2020  ARNETHA SILVERTHORNE 28-Nov-1946 [id number]   EMMI-general discharge on APL  discharged from [place] Day # 4 Date: Wednesday 05/28/20 1012  Red Alert Reason: Unfilled prescriptions? Yes   Insurance: a[employer]na medicare  [facility] admissions x 2 ED visits x 2 in the last 6 months    Outreach attempt #1 Patient is able to verify name only as she reports she is in the middle of an eye exam [facility] RN CM and she discussed a return call    EMMI:  pending   Social: Mrs Meghanne Pletz is a 74 year old female who has support of her family  Plan: [facility] RN CM will attempt another outreach to Mrs Srey within the next 4-7 business days   [employer] [name], RN, BSN, [place] Coordinator Office number [phone] Mobile number [phone]  Main [facility] number [phone] Fax number [phone]

## 2020-06-03 ENCOUNTER — Other Ambulatory Visit: Payer: Self-pay | Admitting: *Deleted

## 2020-06-03 ENCOUNTER — Encounter: Payer: Self-pay | Admitting: *Deleted

## 2020-06-03 ENCOUNTER — Other Ambulatory Visit: Payer: Self-pay

## 2020-06-03 NOTE — Patient Outreach (Signed)
[facility] [facility]) Care Management  06/03/2020  ANJULIE DIPIERRO 04-Jul-1946 [id number]   This encounter opened in error  [place] [name], RN, BSN, [place] Coordinator Office number [phone] Mobile number [phone]  Main [facility] number [phone] Fax number [phone]

## 2020-06-03 NOTE — Patient Outreach (Signed)
[facility]) Care Management  06/03/2020  Joyce Potter January 29, 1946 [id number]   EMMI-general discharge on APL  discharged from [facility] 4 Date: Wednesday 05/28/20 1012  Red Alert Reason: Unfilled prescriptions? Yes   Insurance: Joyce Potter  [facility] admissions x 2 ED visits x 2 in the last 6 months  Last admission on 05/20/20 to 05/23/20 [facility]   Outreach attempt #2 successful  Patient is able to verify HIPAA (Joyce Potter) identifiers, date of birth (DOB) and address Reviewed and addressed referral to [facility] ([place] with patient   EMMI:   She confirms the EMMI response is correct  Her spiriva was discontinued at discharge from [facility] but her primary care provider (PCP) she reports wanted her to continue it so provided her with a 28 day supply  She reports she was informed [employer] premier plus PPO plan would not cover it.   She confirms she is not seen by a pulmonologist for her COPD but she is assisted by Dr Joyce Potter and/or Dr Joyce Potter her cardiologist She has not received patient assistance applications at this time [facility] RN CM confirmed the insurance coverage and discussed the 2021 [employer] medication formulary and patient assistance programs She reports having various allergies  [facility] RN CM consulted with [facility] pharmacy staff, Dr Joyce Potter who received a pharmacy referral for Lantus and Spiriva from the patient's MD office  Social: [name] is a 74 year old female who has support of her family Her husband has dementia (she the primary caregiver) She has support of her daughter  Conditions Gastrointestinal (GI) bleeding,  Anemia, Coronary Artery Disease (CAD), Chronic obstructive pulmonary disease (COPD), Hypertension (HTN), peptic ulcer disease (PUD), Hyperlipidemia (HLD), Diabetes (DM) type 2 (05/21/20 HgA1c 7.3), cerebrovascular accident (CVA), peripheral vascular disease (PVD),  multinodular goiter, history (hx) of multiple rib fractures, snoring, chronic fatigue obesity, former smoker incontinence of bowel and bladder Falls 2+ no injury DME glucometercane glasses  Plan: [facility] RN CM will outreach to [name] within the next 7-10 business days  Pt encouraged to return a call to [facility] RN CM prn  [facility] RN CM sent a [employer] brochure, [employer], [facility] consent form with return envelope and know before you go sheet enclosed for review  [facility] MD involvement barriers letter sent  Routed note to MDs/NP/PA  [facility] CM Care Plan Problem One     Most Recent Value  Care Plan Problem One  knowledge deficit for home care of major medical conditions  Role Documenting the Problem One  Care Management Telephonic Coordinator  Care Plan for Problem One  Active  [facility] Long Term Goal   over the next 90 days patient will be able to verbalize with outreach interventions to manage HTN, DM, COPD  [facility] Long Term Goal Start Date  06/03/20  Interventions for Problem One Long Term Goal  EMMI assessment completed, confirmed she has a 28 day supply, confirmed the insurance coverage and discussed the 2021 [employer] medication formulary and patient assistance programs, collaborated with [facility] pharmacy staff  [facility] CM Short Term Goal #1   over the next 30 days patient will receive assistance for cost concerns with Spiriva and Lantus  [facility] CM Short Term Goal #1 Start Date  06/03/20  Interventions for Short Term Goal #1  EMMI assessment completed, confirmed she has a 28 day supply, confirmed the insurance coverage and discussed the 2021 [facility] medication formulary  and patient assistance programscollaborated with [facility] pharmacy staff      Joyce Potter. Joyce Hamman, RN, BSN, [place] Coordinator Office number [phone] Mobile number [phone]  Main [facility] number [phone] Fax number [phone]

## 2020-06-04 ENCOUNTER — Other Ambulatory Visit: Payer: Self-pay

## 2020-06-04 ENCOUNTER — Encounter: Payer: Self-pay | Admitting: Gastroenterology

## 2020-06-04 ENCOUNTER — Ambulatory Visit: Payer: Medicare HMO | Admitting: Gastroenterology

## 2020-06-04 VITALS — BP 137/80 | HR 89 | Temp 98.3°F | Ht 63.0 in | Wt 199.0 lb

## 2020-06-04 DIAGNOSIS — D509 Iron deficiency anemia, unspecified: Secondary | ICD-10-CM

## 2020-06-04 NOTE — Addendum Note (Signed)
Addended by: Wayna Chalet on: 06/04/2020 02:03 PM   Modules accepted: Orders

## 2020-06-04 NOTE — Progress Notes (Signed)
Joyce Antigua, MD [address]  [place]  [place], [place] [address]  Main: [phone]  Fax: [phone]   Primary Care Physician: [name], MD   Chief complaint: Hospital follow-up  HPI: Joyce Potter is a 74 y.o. female here for hospital follow-up for melena.  Patient reports dark brown stools since being home.  Still has fatigue.  No red blood in stool.  EGD and colonoscopy reports from her hospitalization which were unrevealing for cause of her anemia and melena.  Current Outpatient Medications  Medication Sig Dispense Refill  . albuterol (PROAIR HFA) 108 (90 Base) MCG/ACT inhaler Inhale 2 puffs into the lungs every 4 (four) hours as needed for wheezing or shortness of breath.     Marland Kitchen alendronate (FOSAMAX) 70 MG tablet Take 70 mg by mouth once a week.     Marland Kitchen amitriptyline (ELAVIL) 25 MG tablet Take 25 mg by mouth at bedtime as needed for sleep.    . clopidogrel (PLAVIX) 75 MG tablet Take 1 tablet (75 mg total) by mouth daily with breakfast. 30 tablet 11  . diclofenac Sodium (VOLTAREN) 1 % GEL Apply 2 g topically 4 (four) times daily as needed (pain).    Marland Kitchen gabapentin (NEURONTIN) 300 MG capsule Take 300 mg by mouth 2 (two) times daily.     Marland Kitchen glucose blood (ONETOUCH VERIO) test strip USE TO CHECK BLOOD SUGAR THREE TIMES DAILY AS INSTRUCTED    . insulin NPH-regular Human (NOVOLIN 70/30) (70-30) 100 UNIT/ML injection Inject 60-80 Units into the skin See admin instructions. Inject 80 units subcutaneously every morning and 60 units every afternoon    . Insulin Syringe-Needle U-100 (INSULIN SYRINGE 1CC/30GX5/16") 30G X 5/16" 1 ML MISC USE ONE TWICE DAILY WITH INSULIN    . isosorbide mononitrate (IMDUR) 30 MG 24 hr tablet Take 30 mg by mouth daily.    Marland Kitchen lisinopril (ZESTRIL) 10 MG tablet Take 10 mg by mouth daily.    . metoprolol tartrate (LOPRESSOR) 100 MG tablet Take 100 mg by mouth 2 (two) times daily.     . pantoprazole (PROTONIX) 40 MG tablet Take 40 mg by mouth  daily.    . pravastatin (PRAVACHOL) 20 MG tablet Take 20 mg by mouth at bedtime.    Marland Kitchen SPIRIVA HANDIHALER 18 MCG inhalation capsule     . traMADol (ULTRAM) 50 MG tablet Take 50 mg by mouth every 6 (six) hours as needed for moderate pain.    . TRULICITY 1.5 ON/6.2XB SOPN Inject 0.5 mLs into the skin once a week.     No current facility-administered medications for this visit.   Facility-Administered Medications Ordered in Other Visits  Medication Dose Route Frequency Provider Last Rate Last Admin  . heparin lock flush 100 unit/mL  500 Units Intravenous Once Verlon Au, NP      . sodium chloride flush (NS) 0.9 % injection 10 mL  10 mL Intravenous Once Verlon Au, NP        Allergies as of 06/04/2020 - Review Complete 06/04/2020  Allergen Reaction Noted  . Invokamet [canagliflozin-metformin hcl] Other (See Comments) 08/08/2015  . Sulfa antibiotics Other (See Comments) 04/13/2013  . Aspirin Other (See Comments) 09/28/2015  . Lovastatin Rash 08/08/2015  . Penicillins Rash 04/13/2013  . Vicodin [hydrocodone-acetaminophen] Rash 04/13/2013    ROS:  General: Negative for anorexia, weight loss, fever, chills, fatigue, weakness. ENT: Negative for hoarseness, difficulty swallowing , nasal congestion. CV: Negative for chest pain, angina, palpitations, dyspnea on exertion,  peripheral edema.  Respiratory: Negative for dyspnea at rest, dyspnea on exertion, cough, sputum, wheezing.  GI: See history of present illness. GU:  Negative for dysuria, hematuria, urinary incontinence, urinary frequency, nocturnal urination.  Endo: Negative for unusual weight change.    Physical Examination:   BP 137/80   Pulse 89   Temp 98.3 F (36.8 C) (Oral)   Ht 5\' 3"  (1.6 m)   Wt 199 lb (90.3 kg)   BMI 35.25 kg/m   General: Well-nourished, well-developed in no acute distress.  Eyes: No icterus. Conjunctivae pink. Mouth: Oropharyngeal mucosa moist and pink , no lesions erythema or exudate. Neck:  Supple, Trachea midline Abdomen: Bowel sounds are normal, nontender, nondistended, no hepatosplenomegaly or masses, no abdominal bruits or hernia , no rebound or guarding.   Extremities: No lower extremity edema. No clubbing or deformities. Neuro: Alert and oriented x 3.  Grossly intact. Skin: Warm and dry, no jaundice.   Psych: Alert and cooperative, normal mood and affect.   Labs: CMP     Component Value Date/Time   NA 141 05/23/2020 0131   NA 143 05/04/2013 0614   K 3.8 05/23/2020 0131   K 3.8 05/04/2013 0614   CL 109 05/23/2020 0131   CL 110 (H) 05/04/2013 0614   CO2 26 05/23/2020 0131   CO2 27 05/04/2013 0614   GLUCOSE 96 05/23/2020 0131   GLUCOSE 130 (H) 05/04/2013 0614   BUN 9 05/23/2020 0131   BUN 12 05/04/2013 0614   CREATININE 0.85 05/23/2020 0131   CREATININE 0.79 08/27/2014 1445   CALCIUM 8.2 (L) 05/23/2020 0131   CALCIUM 8.2 (L) 05/04/2013 0614   PROT 5.8 (L) 05/23/2020 0131   PROT 7.6 08/27/2014 1445   ALBUMIN 3.2 (L) 05/23/2020 0131   ALBUMIN 3.5 08/27/2014 1445   AST 19 05/23/2020 0131   AST 27 08/27/2014 1445   ALT 16 05/23/2020 0131   ALT 27 08/27/2014 1445   ALKPHOS 55 05/23/2020 0131   ALKPHOS 101 08/27/2014 1445   BILITOT 0.5 05/23/2020 0131   BILITOT 0.4 08/27/2014 1445   GFRNONAA >60 05/23/2020 0131   GFRNONAA >60 08/27/2014 1445   GFRAA >60 05/23/2020 0131   GFRAA >60 08/27/2014 1445   Lab Results  Component Value Date   WBC 4.2 05/22/2020   HGB 7.8 (L) 05/23/2020   HCT 25.4 (L) 05/23/2020   MCV 83.4 05/22/2020   PLT 244 05/22/2020    Imaging Studies: CARDIAC CATHETERIZATION  Result Date: 05/15/2020  Ost 1st Diag lesion is 60% stenosed.  1st Diag-1 lesion is 60% stenosed.  1st Diag-2 lesion is 75% stenosed.  Prox LAD lesion is 55% stenosed.  Prox RCA lesion is 95% stenosed.  Previously placed Prox Cx to Mid Cx stent (unknown type) is widely patent.  Prox Cx lesion is 50% stenosed.  The left ventricular systolic function is  normal.  The left ventricular ejection fraction is 55-65% by visual estimate.  There is trivial (1+) mitral regurgitation.  There is moderate aortic valve stenosis.  LV end diastolic pressure is normal.  LM-normal LAD-insignificant disease LCx-patent stent in lcx RCA-95% proximal stenosis Moderate as PCI of rca Medical management of as   CARDIAC CATHETERIZATION  Result Date: 05/15/2020  Prox RCA-1 lesion is 60% stenosed.  Prox RCA-2 lesion is 95% stenosed.  A drug-eluting stent was successfully placed using a STENT RESOLUTE ONYX 2.5X26.  Post intervention, there is a 0% residual stenosis.  Post intervention, there is a 0% residual stenosis.  1.  One-vessel  coronary artery disease with high-grade stenosis proximal/mid RCA 2.  Successful PCI with DES proximal/mid RCA   MM 3D SCREEN BREAST BILATERAL  Result Date: 05/08/2020 CLINICAL DATA:  Screening. EXAM: DIGITAL SCREENING BILATERAL MAMMOGRAM WITH TOMO AND CAD COMPARISON:  Previous exam(s). ACR Breast Density Category b: There are scattered areas of fibroglandular density. FINDINGS: There are no findings suspicious for malignancy. Images were processed with CAD. IMPRESSION: No mammographic evidence of malignancy. A result letter of this screening mammogram will be mailed directly to the patient. RECOMMENDATION: Screening mammogram in one year. (Code:SM-B-01Y) BI-RADS CATEGORY  1: Negative. Electronically Signed   By: Everlean Alstrom M.D.   On: 05/08/2020 14:18    Assessment and Plan:   Joyce Potter is a 74 y.o. y/o female here for post hospital follow-up of melena and anemia  Proceed with small bowel capsule study for assessment of iron deficiency anemia We will try to contact her hematologist to get a sooner appointment for her  If melena reoccurs she was advised to notify us or go to the ER and she verbalized understanding  Repeat hemoglobin at this time to ensure it is improving  Dr Joyce Potter

## 2020-06-05 ENCOUNTER — Ambulatory Visit: Payer: Self-pay | Admitting: Pharmacist

## 2020-06-05 LAB — HEMOGLOBIN: Hemoglobin: 7.9 g/dL — ABNORMAL LOW (ref 11.1–15.9)

## 2020-06-06 ENCOUNTER — Telehealth: Payer: Self-pay

## 2020-06-06 ENCOUNTER — Other Ambulatory Visit: Payer: Self-pay | Admitting: Pharmacist

## 2020-06-06 NOTE — Telephone Encounter (Signed)
-----   Message from Virgel Manifold, MD sent at 06/06/2020 10:46 AM EDT ----- Herb Grays please let the patient know, her hemoglobin is still low at 7.9.  Please ask her to follow-up with Dr. Grayland Ormond.  Proceed with capsule study as scheduled.  If she starts having black stools again, she needs to let us know immediately or go to the ER.  Can her capsule study be  moved up to a sooner date?

## 2020-06-06 NOTE — Telephone Encounter (Signed)
Called patient to let her know that her iron was still low and that Dr. Bonna Gains wanted her to have her capsule study sooner, therefore, it was changed to 06/11/2020 and patient agreed. Patient was also told to see Dr. Grayland Ormond sooner and she stated that she already had her appointment set-up for 06/09/2020-Monday. Trish and Traci were notified of the changes.

## 2020-06-06 NOTE — Progress Notes (Signed)
Patient stated her stool is dark as before

## 2020-06-06 NOTE — Patient Outreach (Signed)
[facility]) Care Management  06/06/2020  Joyce Potter [birth year] [id number]  Patient was called regarding medication assistance. Unfortunately, she did not answer her phone. HIPAA compliant message was left on her voicemail.  Plan: Await a call back from the patient.  [name], PharmD, [place] Clinical Pharmacist [phone]  ADDENDUM  Patient did not call me back before the end of the day. However, Spiriva has been added back to her medication list. As such, she will be sent patient assistance forms for Spiriva so her patient assistance process will not be delayed.    Spoke with [place], Joyce Potter who stated the patient said she was able to afford her insulin. I will speak with the patient about insulin when I am able to reach her for patient assistance.  Plan: I will route patient assistance letter to [place] technician who will coordinate patient assistance program application process for medications listed above.  [facility] pharmacy technician will assist with obtaining all required documents from both patient and provider(s) and submit application(s) once completed.     [name], PharmD, [place] Clinical Pharmacist [phone]

## 2020-06-07 NOTE — Progress Notes (Signed)
[place]  [phone] [phone]  ID: Joyce Potter OB: 05-08-1946  MR#: [id number]  ZWC#:[id number]  Patient Care Team: [name], MD as PCP - General (Internal Medicine) Lloyd Huger, MD as Consulting Physician (Hematology and Oncology) Barbaraann Faster, RN as [place] [facility] Fath, Javier Docker, MD as Consulting Physician (Cardiology) Solum, Betsey Holiday, MD as Physician Assistant (Endocrinology) Virgel Manifold, MD as Consulting Physician (Gastroenterology)  CHIEF COMPLAINT: Iron deficiency anemia, secondary to chronic blood loss.  INTERVAL HISTORY: Patient returns to clinic today as an add-on after evaluation by her GI physician.  She has increasing weakness and fatigue as well as black tarry stools.  Recent colonoscopy and EGD were unrevealing, but she has plans to do video endoscopy in the near future. She has no neurologic complaints.  She has a good appetite and denies weight loss.  She denies any recent fevers or illnesses.  She denies chest pain, shortness of breath, cough or hemoptysis. She denies any nausea, vomiting, constipation, or diarrhea.  She has no urinary complaints.  Patient offers no further specific complaints today.    REVIEW OF SYSTEMS:   Review of Systems  Constitutional: Positive for malaise/fatigue. Negative for diaphoresis, fever and weight loss.  Eyes: Negative.  Negative for blurred vision.  Respiratory: Negative.  Negative for cough, hemoptysis, shortness of breath and wheezing.   Cardiovascular: Negative.  Negative for chest pain and leg swelling.  Gastrointestinal: Positive for melena. Negative for abdominal pain and blood in stool.  Genitourinary: Negative.  Negative for flank pain and hematuria.  Musculoskeletal: Negative.  Negative for falls and joint pain.  Skin: Negative.  Negative for rash.  Neurological: Positive for weakness. Negative for dizziness, focal weakness and  headaches.  Psychiatric/Behavioral: Negative.  The patient is not nervous/anxious.    As per HPI. Otherwise, a complete review of systems is negative.  PAST MEDICAL HISTORY: Past Medical History:  Diagnosis Date  . Anemia    iron deficiency  . Aortic stenosis   . Basal cell carcinoma   . CAD (coronary artery disease)    s/p Left circumflex stent in 2012  . Carotid stenosis   . Cataract   . High cholesterol   . HTN (hypertension)   . Hyperlipidemia   . IDDM (insulin dependent diabetes mellitus)   . Multiple thyroid nodules    Benign  . Peptic ulcer disease   . Retinal artery occlusion    on left    PAST SURGICAL HISTORY: Past Surgical History:  Procedure Laterality Date  . ABDOMINAL HYSTERECTOMY    . ARTERY BIOPSY Left 11/19/2015   Procedure: BIOPSY TEMPORAL ARTERY;  Surgeon: Algernon Huxley, MD;  Location: [facility] ORS;  Service: Vascular;  Laterality: Left;  . BREAST BIOPSY Left 2002   core- neg  . BREAST EXCISIONAL BIOPSY    . CARDIAC CATHETERIZATION N/A 08/08/2015   Procedure: Right and Left Heart Cath and Coronary Angiography;  Surgeon: Teodoro Spray, MD;  Location: [place] [facility];  Service: Cardiovascular;  Laterality: N/A;  . CARDIAC CATHETERIZATION Bilateral 09/03/2016   Procedure: Right/Left Heart Cath and Coronary Angiography;  Surgeon: Teodoro Spray, MD;  Location: [place] [facility];  Service: Cardiovascular;  Laterality: Bilateral;  . CATARACT EXTRACTION W/ INTRAOCULAR LENS  IMPLANT, BILATERAL    . COLONOSCOPY     in 2013- internal hemorrhoids  . COLONOSCOPY WITH PROPOFOL N/A 07/07/2018   Procedure: COLONOSCOPY WITH PROPOFOL;  Surgeon: Manya Silvas,  MD;  Location: [facility] ENDOSCOPY;  Service: Endoscopy;  Laterality: N/A;  . COLONOSCOPY WITH PROPOFOL N/A 05/22/2020   Procedure: COLONOSCOPY WITH PROPOFOL;  Surgeon: Jonathon Bellows, MD;  Location: [facility] ENDOSCOPY;  Service: Gastroenterology;  Laterality: N/A;  . CORONARY ANGIOGRAPHY N/A 09/14/2017   Procedure:  CORONARY ANGIOGRAPHY;  Surgeon: Teodoro Spray, MD;  Location: [place] [facility];  Service: Cardiovascular;  Laterality: N/A;  . CORONARY ANGIOPLASTY    . CORONARY STENT INTERVENTION N/A 05/15/2020   Procedure: coronary angiography;  Surgeon: Teodoro Spray, MD;  Location: [place] [facility];  Service: Cardiovascular;  Laterality: N/A;  . CORONARY STENT INTERVENTION N/A 05/15/2020   Procedure: CORONARY STENT INTERVENTION;  Surgeon: Isaias Cowman, MD;  Location: [place] [facility];  Service: Cardiovascular;  Laterality: N/A;  . CORONARY STENT PLACEMENT    . ENDARTERECTOMY Left 10/01/2015   Procedure: ENDARTERECTOMY CAROTID;  Surgeon: Algernon Huxley, MD;  Location: [facility] ORS;  Service: Vascular;  Laterality: Left;  . ESOPHAGOGASTRODUODENOSCOPY    . ESOPHAGOGASTRODUODENOSCOPY (EGD) WITH PROPOFOL N/A 05/31/2016   Procedure: ESOPHAGOGASTRODUODENOSCOPY (EGD) WITH PROPOFOL;  Surgeon: Manya Silvas, MD;  Location: [facility] END[facility]COPY;  Service: Endoscopy;  Laterality: N/A;  . ESOPHAGOGASTRODUODENOSCOPY (EGD) WITH PROPOFOL N/A 07/07/2018   Procedure: ESOPHAGOGASTRODUODENOSCOPY (EGD) WITH PROPOFOL;  Surgeon: Manya Silvas, MD;  Location: [facility] ENDOSCOPY;  Service: Endoscopy;  Laterality: N/A;  . ESOPHAGOGASTRODUODENOSCOPY (EGD) WITH PROPOFOL N/A 07/17/2018   Procedure: ESOPHAGOGASTRODUODENOSCOPY (EGD) WITH PROPOFOL;  Surgeon: Lucilla Lame, MD;  Location: [facility]COPY;  Service: Endoscopy;  Laterality: N/A;  . ESOPHAGOGASTRODUODENOSCOPY (EGD) WITH PROPOFOL N/A 05/21/2020   Procedure: ESOPHAGOGASTRODUODENOSCOPY (EGD) WITH PROPOFOL;  Surgeon: Jonathon Bellows, MD;  Location: [facility] ENDOSCOPY;  Service: Gastroenterology;  Laterality: N/A;  . EYE SURGERY    . GIVENS CAPSULE STUDY N/A 04/17/2013   Procedure: GIVENS CAPSULE STUDY;  Surgeon: Arta Silence, MD;  Location: [facility] ENDOSCOPY;  Service: Endoscopy;  Laterality: N/A;  patient ate breakfast at 7am   . HEMORRHOID BANDING  07/27/2016   Dr Viona Gilmore. Tamala Julian  . PARTIAL  HYSTERECTOMY    . RIGHT AND LEFT HEART CATH Bilateral 09/14/2017   Procedure: RIGHT AND LEFT HEART CATH;  Surgeon: Teodoro Spray, MD;  Location: [place] [facility];  Service: Cardiovascular;  Laterality: Bilateral;  . RIGHT/LEFT HEART CATH AND CORONARY ANGIOGRAPHY Right 05/15/2020   Procedure: Right/Left Heart cath;  Surgeon: Teodoro Spray, MD;  Location: [place] [facility];  Service: Cardiovascular;  Laterality: Right;  . TRIGGER FINGER RELEASE      FAMILY HISTORY Family History  Problem Relation Age of Onset  . Uterine cancer Mother   . Breast cancer Mother 42  . Seizures Father   . Stroke Father   . Diabetes Father   . COPD Father   . Colon cancer Neg Hx       ADVANCED DIRECTIVES:    HEALTH MAINTENANCE: Social History   Tobacco Use  . Smoking status: Former Smoker    Quit date: 02/15/1985    Years since quitting: 35.3  . Smokeless tobacco: Never Used  . Tobacco comment: quit about 31 years ago  Vaping Use  . Vaping Use: Never used  Substance Use Topics  . Alcohol use: No    Alcohol/week: 0.0 standard drinks  . Drug use: No    Colonoscopy:  PAP:  Bone density: Osteoporosis- DEXA 06/2017 (on fosamax)  Lipid panel:  Allergies  Allergen Reactions  . Invokamet [Canagliflozin-Metformin Hcl] Other (See Comments)    Yeast infection  .  Sulfa Antibiotics Other (See Comments)    "Got Drunk"  . Aspirin Other (See Comments)    ulcers  . Lovastatin Rash  . Penicillins Rash    Has patient had a PCN reaction causing immediate rash, facial/tongue/throat swelling, SOB or lightheadedness with hypotension:Yes Has patient had a PCN reaction causing severe rash involving mucus membranes or skin necrosis:all over body Has patient had a PCN reaction that required hospitalization: No Has patient had a PCN reaction occurring within the last 10 years: No If all of the above answers are "NO", then may proceed with Cephalosporin use.   . Vicodin [Hydrocodone-Acetaminophen] Rash      Current Outpatient Medications  Medication Sig Dispense Refill  . albuterol (PROAIR HFA) 108 (90 Base) MCG/ACT inhaler Inhale 2 puffs into the lungs every 4 (four) hours as needed for wheezing or shortness of breath.     Marland Kitchen alendronate (FOSAMAX) 70 MG tablet Take 70 mg by mouth once a week.     Marland Kitchen amitriptyline (ELAVIL) 25 MG tablet Take 25 mg by mouth at bedtime as needed for sleep.    . clopidogrel (PLAVIX) 75 MG tablet Take 1 tablet (75 mg total) by mouth daily with breakfast. 30 tablet 11  . diclofenac Sodium (VOLTAREN) 1 % GEL Apply 2 g topically 4 (four) times daily as needed (pain).    Marland Kitchen gabapentin (NEURONTIN) 300 MG capsule Take 300 mg by mouth 2 (two) times daily.     Marland Kitchen glucose blood (ONETOUCH VERIO) test strip USE TO CHECK BLOOD SUGAR THREE TIMES DAILY AS INSTRUCTED    . insulin NPH-regular Human (NOVOLIN 70/30) (70-30) 100 UNIT/ML injection Inject 60-80 Units into the skin See admin instructions. Inject 80 units subcutaneously every morning and 60 units every afternoon    . Insulin Syringe-Needle U-100 (INSULIN SYRINGE 1CC/30GX5/16") 30G X 5/16" 1 ML MISC USE ONE TWICE DAILY WITH INSULIN    . isosorbide mononitrate (IMDUR) 30 MG 24 hr tablet Take 30 mg by mouth daily.    Marland Kitchen lisinopril (ZESTRIL) 10 MG tablet Take 10 mg by mouth daily.    . metoprolol tartrate (LOPRESSOR) 100 MG tablet Take 100 mg by mouth 2 (two) times daily.     . pantoprazole (PROTONIX) 40 MG tablet Take 40 mg by mouth daily.    . pravastatin (PRAVACHOL) 20 MG tablet Take 20 mg by mouth at bedtime.    Marland Kitchen SPIRIVA HANDIHALER 18 MCG inhalation capsule     . traMADol (ULTRAM) 50 MG tablet Take 50 mg by mouth every 6 (six) hours as needed for moderate pain.    . TRULICITY 1.5 MG/8.6PY SOPN Inject 0.5 mLs into the skin once a week.     No current facility-administered medications for this visit.   Facility-Administered Medications Ordered in Other Visits  Medication Dose Route Frequency Provider Last Rate Last Admin   . heparin lock flush 100 unit/mL  500 Units Intravenous Once Verlon Au, NP      . sodium chloride flush (NS) 0.9 % injection 10 mL  10 mL Intravenous Once Verlon Au, NP        OBJECTIVE: Vitals:   06/06/20 1604  BP: (!) 166/59  Pulse: 97  Resp: 20  SpO2: 100%     Body mass index is 34.86 kg/m.    ECOG FS:1 - Symptomatic but completely ambulatory  General: Well-developed, well-nourished, no acute distress. Eyes: Pink conjunctiva, anicteric sclera. HEENT: Normocephalic, moist mucous membranes. Lungs: No audible wheezing or coughing. Heart: Regular rate  and rhythm. Abdomen: Soft, nontender, no obvious distention. Musculoskeletal: No edema, cyanosis, or clubbing. Neuro: Alert, answering all questions appropriately. Cranial nerves grossly intact. Skin: No rashes or petechiae noted. Psych: Normal affect.  LAB RESULTS:  Lab Results  Component Value Date   NA 141 05/23/2020   K 3.8 05/23/2020   CL 109 05/23/2020   CO2 26 05/23/2020   GLUCOSE 96 05/23/2020   BUN 9 05/23/2020   CREATININE 0.85 05/23/2020   CALCIUM 8.2 (L) 05/23/2020   PROT 5.8 (L) 05/23/2020   ALBUMIN 3.2 (L) 05/23/2020   AST 19 05/23/2020   ALT 16 05/23/2020   ALKPHOS 55 05/23/2020   BILITOT 0.5 05/23/2020   GFRNONAA >60 05/23/2020   GFRAA >60 05/23/2020    Lab Results  Component Value Date   WBC 4.2 06/09/2020   NEUTROABS 2.7 06/09/2020   HGB 7.5 (L) 06/09/2020   HCT 24.0 (L) 06/09/2020   MCV 81.9 06/09/2020   PLT 266 06/09/2020   Lab Results  Component Value Date   IRON 22 (L) 06/09/2020   TIBC 329 06/09/2020   IRONPCTSAT 7 (L) 06/09/2020    Lab Results  Component Value Date   FERRITIN 8 (L) 06/09/2020     STUDIES: CARDIAC CATHETERIZATION  Result Date: 05/15/2020  Ost 1st Diag lesion is 60% stenosed.  1st Diag-1 lesion is 60% stenosed.  1st Diag-2 lesion is 75% stenosed.  Prox LAD lesion is 55% stenosed.  Prox RCA lesion is 95% stenosed.  Previously placed Prox Cx  to Mid Cx stent (unknown type) is widely patent.  Prox Cx lesion is 50% stenosed.  The left ventricular systolic function is normal.  The left ventricular ejection fraction is 55-65% by visual estimate.  There is trivial (1+) mitral regurgitation.  There is moderate aortic valve stenosis.  LV end diastolic pressure is normal.  LM-normal LAD-insignificant disease LCx-patent stent in lcx RCA-95% proximal stenosis Moderate as PCI of rca Medical management of as   CARDIAC CATHETERIZATION  Result Date: 05/15/2020  Prox RCA-1 lesion is 60% stenosed.  Prox RCA-2 lesion is 95% stenosed.  A drug-eluting stent was successfully placed using a STENT RESOLUTE ONYX 2.5X26.  Post intervention, there is a 0% residual stenosis.  Post intervention, there is a 0% residual stenosis.  1.  One-vessel coronary artery disease with high-grade stenosis proximal/mid RCA 2.  Successful PCI with DES proximal/mid RCA    ASSESSMENT: Iron deficiency anemia, secondary to chronic blood loss.  PLAN:    1. Iron deficiency anemia: Patient's hemoglobin and iron stores have declined and she is symptomatic.  She reports melena, but recent colonoscopy and EGD are negative.  She has video endoscopy in the near future.  Previously,nuclear medicine bleeding scan completed on 08/23/2018 did not reveal any significant GI bleed.  Bone marrow biopsy on 02/15/2017 did not reveal any significant pathology.  Proceed with 200 mg IV Venofer today.  Return to clinic in 1, 2, and 3 weeks for IV Venofer only.  Patient will then return to clinic in 8 weeks with repeat laboratory work, further evaluation, and consideration of additional IV iron if needed. 2.  Melena: Continue follow-up with GI as scheduled.  I spent a total of 30 minutes reviewing chart data, face-to-face evaluation with the patient, counseling and coordination of care as detailed above.   Patient expressed understanding and was in agreement with this plan. She also understands  that She can call clinic at any time with any questions, concerns, or complaints.  Lloyd Huger, MD 06/10/20 6:39 AM

## 2020-06-09 ENCOUNTER — Inpatient Hospital Stay: Payer: Medicare HMO | Attending: Oncology

## 2020-06-09 ENCOUNTER — Inpatient Hospital Stay (HOSPITAL_BASED_OUTPATIENT_CLINIC_OR_DEPARTMENT_OTHER): Payer: Medicare HMO | Admitting: Oncology

## 2020-06-09 ENCOUNTER — Inpatient Hospital Stay: Payer: Medicare HMO

## 2020-06-09 ENCOUNTER — Other Ambulatory Visit: Payer: Self-pay

## 2020-06-09 VITALS — BP 166/59 | HR 97 | Resp 20 | Wt 196.8 lb

## 2020-06-09 VITALS — BP 135/62 | HR 87 | Temp 98.0°F | Resp 18

## 2020-06-09 DIAGNOSIS — K922 Gastrointestinal hemorrhage, unspecified: Secondary | ICD-10-CM | POA: Diagnosis not present

## 2020-06-09 DIAGNOSIS — D509 Iron deficiency anemia, unspecified: Secondary | ICD-10-CM

## 2020-06-09 DIAGNOSIS — Z87891 Personal history of nicotine dependence: Secondary | ICD-10-CM | POA: Insufficient documentation

## 2020-06-09 DIAGNOSIS — D500 Iron deficiency anemia secondary to blood loss (chronic): Secondary | ICD-10-CM | POA: Diagnosis not present

## 2020-06-09 DIAGNOSIS — K5521 Angiodysplasia of colon with hemorrhage: Secondary | ICD-10-CM | POA: Diagnosis not present

## 2020-06-09 DIAGNOSIS — K921 Melena: Secondary | ICD-10-CM | POA: Insufficient documentation

## 2020-06-09 LAB — CBC WITH DIFFERENTIAL/PLATELET
Abs Immature Granulocytes: 0.01 10*3/uL (ref 0.00–0.07)
Basophils Absolute: 0 10*3/uL (ref 0.0–0.1)
Basophils Relative: 1 %
Eosinophils Absolute: 0 10*3/uL (ref 0.0–0.5)
Eosinophils Relative: 1 %
HCT: 24 % — ABNORMAL LOW (ref 36.0–46.0)
Hemoglobin: 7.5 g/dL — ABNORMAL LOW (ref 12.0–15.0)
Immature Granulocytes: 0 %
Lymphocytes Relative: 23 %
Lymphs Abs: 1 10*3/uL (ref 0.7–4.0)
MCH: 25.6 pg — ABNORMAL LOW (ref 26.0–34.0)
MCHC: 31.3 g/dL (ref 30.0–36.0)
MCV: 81.9 fL (ref 80.0–100.0)
Monocytes Absolute: 0.5 10*3/uL (ref 0.1–1.0)
Monocytes Relative: 12 %
Neutro Abs: 2.7 10*3/uL (ref 1.7–7.7)
Neutrophils Relative %: 63 %
Platelets: 266 10*3/uL (ref 150–400)
RBC: 2.93 MIL/uL — ABNORMAL LOW (ref 3.87–5.11)
RDW: 14.8 % (ref 11.5–15.5)
WBC: 4.2 10*3/uL (ref 4.0–10.5)
nRBC: 0 % (ref 0.0–0.2)

## 2020-06-09 LAB — IRON AND TIBC
Iron: 22 ug/dL — ABNORMAL LOW (ref 28–170)
Saturation Ratios: 7 % — ABNORMAL LOW (ref 10.4–31.8)
TIBC: 329 ug/dL (ref 250–450)
UIBC: 307 ug/dL

## 2020-06-09 LAB — FERRITIN: Ferritin: 8 ng/mL — ABNORMAL LOW (ref 11–307)

## 2020-06-09 MED ORDER — SODIUM CHLORIDE 0.9 % IV SOLN: 200.0000 mg | Freq: Once | INTRAVENOUS | Status: DC

## 2020-06-09 MED ORDER — IRON SUCROSE 20 MG/ML IV SOLN
200.0000 mg | Freq: Once | INTRAVENOUS | Status: AC
  Administered 2020-06-09: 200 mg via INTRAVENOUS
  Filled 2020-06-09: qty 10

## 2020-06-09 MED ORDER — SODIUM CHLORIDE 0.9 % IV SOLN
Freq: Once | INTRAVENOUS | Status: AC
  Filled 2020-06-09: qty 250

## 2020-06-10 ENCOUNTER — Encounter: Payer: Self-pay | Admitting: Gastroenterology

## 2020-06-10 ENCOUNTER — Ambulatory Visit: Payer: Self-pay | Admitting: *Deleted

## 2020-06-10 ENCOUNTER — Other Ambulatory Visit: Payer: Self-pay | Admitting: Pharmacy Technician

## 2020-06-10 NOTE — Patient Outreach (Signed)
[facility]) Care Management  06/10/2020  KARIANNA GUSMAN February 20, 1946 [id number]                                       Medication Assistance Referral  Referral From: [place]  Medication/Company: [employer] / [employer] Patient application portion:  Mailed Provider application portion: Faxed  to Dr. Bartholome Bill Provider address/fax verified via: Office website     Follow up:  Will follow up with patient in 5-10 business days to confirm application(s) have been received.  Lennix Kneisel P. Luciann Gossett, [place]  [phone]

## 2020-06-11 ENCOUNTER — Encounter
Admission: RE | Disposition: A | Discharge status: Home / Self Care | Payer: Self-pay | Source: Home / Self Care | Attending: Gastroenterology

## 2020-06-11 ENCOUNTER — Other Ambulatory Visit: Payer: Self-pay

## 2020-06-11 ENCOUNTER — Other Ambulatory Visit: Payer: Self-pay | Admitting: *Deleted

## 2020-06-11 ENCOUNTER — Ambulatory Visit
Admission: RE | Admit: 2020-06-11 | Discharge: 2020-06-11 | Disposition: A | Discharge status: Home / Self Care | Payer: Medicare HMO | Source: Home / Self Care | Attending: Gastroenterology | Admitting: Gastroenterology

## 2020-06-11 DIAGNOSIS — D509 Iron deficiency anemia, unspecified: Secondary | ICD-10-CM | POA: Diagnosis not present

## 2020-06-11 DIAGNOSIS — D500 Iron deficiency anemia secondary to blood loss (chronic): Secondary | ICD-10-CM | POA: Insufficient documentation

## 2020-06-11 HISTORY — PX: GIVENS CAPSULE STUDY: SHX5432

## 2020-06-11 SURGERY — IMAGING PROCEDURE, GI TRACT, INTRALUMINAL, VIA CAPSULE

## 2020-06-12 ENCOUNTER — Encounter: Payer: Self-pay | Admitting: Emergency Medicine

## 2020-06-12 ENCOUNTER — Inpatient Hospital Stay
Admission: EM | Admit: 2020-06-12 | Discharge: 2020-06-15 | DRG: 345 | Disposition: A | Discharge status: Home / Self Care | Payer: Medicare HMO | Attending: Internal Medicine | Admitting: Internal Medicine

## 2020-06-12 ENCOUNTER — Telehealth: Payer: Self-pay

## 2020-06-12 ENCOUNTER — Other Ambulatory Visit: Payer: Self-pay

## 2020-06-12 ENCOUNTER — Telehealth: Payer: Self-pay | Admitting: Gastroenterology

## 2020-06-12 DIAGNOSIS — Z880 Allergy status to penicillin: Secondary | ICD-10-CM

## 2020-06-12 DIAGNOSIS — Z79899 Other long term (current) drug therapy: Secondary | ICD-10-CM

## 2020-06-12 DIAGNOSIS — Z886 Allergy status to analgesic agent status: Secondary | ICD-10-CM | POA: Diagnosis not present

## 2020-06-12 DIAGNOSIS — I350 Nonrheumatic aortic (valve) stenosis: Secondary | ICD-10-CM | POA: Diagnosis present

## 2020-06-12 DIAGNOSIS — K31811 Angiodysplasia of stomach and duodenum with bleeding: Secondary | ICD-10-CM | POA: Diagnosis not present

## 2020-06-12 DIAGNOSIS — D649 Anemia, unspecified: Secondary | ICD-10-CM | POA: Diagnosis not present

## 2020-06-12 DIAGNOSIS — Z823 Family history of stroke: Secondary | ICD-10-CM

## 2020-06-12 DIAGNOSIS — Z803 Family history of malignant neoplasm of breast: Secondary | ICD-10-CM | POA: Diagnosis not present

## 2020-06-12 DIAGNOSIS — Z882 Allergy status to sulfonamides status: Secondary | ICD-10-CM | POA: Diagnosis not present

## 2020-06-12 DIAGNOSIS — I2510 Atherosclerotic heart disease of native coronary artery without angina pectoris: Secondary | ICD-10-CM | POA: Diagnosis not present

## 2020-06-12 DIAGNOSIS — Z820 Family history of epilepsy and other diseases of the nervous system: Secondary | ICD-10-CM

## 2020-06-12 DIAGNOSIS — Z808 Family history of malignant neoplasm of other organs or systems: Secondary | ICD-10-CM

## 2020-06-12 DIAGNOSIS — E1151 Type 2 diabetes mellitus with diabetic peripheral angiopathy without gangrene: Secondary | ICD-10-CM | POA: Diagnosis present

## 2020-06-12 DIAGNOSIS — E1136 Type 2 diabetes mellitus with diabetic cataract: Secondary | ICD-10-CM | POA: Diagnosis present

## 2020-06-12 DIAGNOSIS — Z20822 Contact with and (suspected) exposure to covid-19: Secondary | ICD-10-CM | POA: Diagnosis not present

## 2020-06-12 DIAGNOSIS — Z7902 Long term (current) use of antithrombotics/antiplatelets: Secondary | ICD-10-CM

## 2020-06-12 DIAGNOSIS — E669 Obesity, unspecified: Secondary | ICD-10-CM | POA: Diagnosis present

## 2020-06-12 DIAGNOSIS — K922 Gastrointestinal hemorrhage, unspecified: Secondary | ICD-10-CM | POA: Diagnosis not present

## 2020-06-12 DIAGNOSIS — Z6835 Body mass index (BMI) 35.0-35.9, adult: Secondary | ICD-10-CM

## 2020-06-12 DIAGNOSIS — I25119 Atherosclerotic heart disease of native coronary artery with unspecified angina pectoris: Secondary | ICD-10-CM | POA: Diagnosis not present

## 2020-06-12 DIAGNOSIS — Z885 Allergy status to narcotic agent status: Secondary | ICD-10-CM

## 2020-06-12 DIAGNOSIS — Z794 Long term (current) use of insulin: Secondary | ICD-10-CM | POA: Diagnosis not present

## 2020-06-12 DIAGNOSIS — E7800 Pure hypercholesterolemia, unspecified: Secondary | ICD-10-CM | POA: Diagnosis present

## 2020-06-12 DIAGNOSIS — E785 Hyperlipidemia, unspecified: Secondary | ICD-10-CM | POA: Diagnosis present

## 2020-06-12 DIAGNOSIS — Z955 Presence of coronary angioplasty implant and graft: Secondary | ICD-10-CM

## 2020-06-12 DIAGNOSIS — E11319 Type 2 diabetes mellitus with unspecified diabetic retinopathy without macular edema: Secondary | ICD-10-CM | POA: Diagnosis present

## 2020-06-12 DIAGNOSIS — G4733 Obstructive sleep apnea (adult) (pediatric): Secondary | ICD-10-CM | POA: Diagnosis not present

## 2020-06-12 DIAGNOSIS — I10 Essential (primary) hypertension: Secondary | ICD-10-CM | POA: Diagnosis present

## 2020-06-12 DIAGNOSIS — E1142 Type 2 diabetes mellitus with diabetic polyneuropathy: Secondary | ICD-10-CM | POA: Diagnosis not present

## 2020-06-12 DIAGNOSIS — K5521 Angiodysplasia of colon with hemorrhage: Principal | ICD-10-CM | POA: Diagnosis present

## 2020-06-12 DIAGNOSIS — Z85828 Personal history of other malignant neoplasm of skin: Secondary | ICD-10-CM | POA: Diagnosis not present

## 2020-06-12 DIAGNOSIS — Z03818 Encounter for observation for suspected exposure to other biological agents ruled out: Secondary | ICD-10-CM | POA: Diagnosis not present

## 2020-06-12 DIAGNOSIS — D62 Acute posthemorrhagic anemia: Secondary | ICD-10-CM | POA: Diagnosis present

## 2020-06-12 DIAGNOSIS — E119 Type 2 diabetes mellitus without complications: Secondary | ICD-10-CM

## 2020-06-12 DIAGNOSIS — E1165 Type 2 diabetes mellitus with hyperglycemia: Secondary | ICD-10-CM | POA: Diagnosis not present

## 2020-06-12 DIAGNOSIS — Z87891 Personal history of nicotine dependence: Secondary | ICD-10-CM

## 2020-06-12 LAB — COMPREHENSIVE METABOLIC PANEL
ALT: 18 U/L (ref 0–44)
AST: 21 U/L (ref 15–41)
Albumin: 3.7 g/dL (ref 3.5–5.0)
Alkaline Phosphatase: 71 U/L (ref 38–126)
Anion gap: 8 (ref 5–15)
BUN: 19 mg/dL (ref 8–23)
CO2: 25 mmol/L (ref 22–32)
Calcium: 9 mg/dL (ref 8.9–10.3)
Chloride: 107 mmol/L (ref 98–111)
Creatinine, Ser: 0.83 mg/dL (ref 0.44–1.00)
GFR calc Af Amer: >60 mL/min (ref >60)
GFR calc non Af Amer: >60 mL/min (ref >60)
Glucose, Bld: 162 mg/dL — ABNORMAL HIGH (ref 70–99)
Potassium: 4.2 mmol/L (ref 3.5–5.1)
Sodium: 140 mmol/L (ref 135–145)
Total Bilirubin: 0.6 mg/dL (ref 0.3–1.2)
Total Protein: 6.9 g/dL (ref 6.5–8.1)

## 2020-06-12 LAB — HEMOGLOBIN AND HEMATOCRIT, BLOOD
HCT: 25.6 % — ABNORMAL LOW (ref 36.0–46.0)
Hemoglobin: 8 g/dL — ABNORMAL LOW (ref 12.0–15.0)

## 2020-06-12 LAB — CBC
HCT: 22.6 % — ABNORMAL LOW (ref 36.0–46.0)
Hemoglobin: 6.8 g/dL — ABNORMAL LOW (ref 12.0–15.0)
MCH: 26.1 pg (ref 26.0–34.0)
MCHC: 30.1 g/dL (ref 30.0–36.0)
MCV: 86.6 fL (ref 80.0–100.0)
Platelets: 316 10*3/uL (ref 150–400)
RBC: 2.61 MIL/uL — ABNORMAL LOW (ref 3.87–5.11)
RDW: 17.1 % — ABNORMAL HIGH (ref 11.5–15.5)
WBC: 5.2 10*3/uL (ref 4.0–10.5)
nRBC: 0 % (ref 0.0–0.2)

## 2020-06-12 LAB — PREPARE RBC (CROSSMATCH)

## 2020-06-12 LAB — PROTIME-INR
INR: 1 (ref 0.8–1.2)
Prothrombin Time: 13.1 seconds (ref 11.4–15.2)

## 2020-06-12 LAB — SARS CORONAVIRUS 2 BY RT PCR (HOSPITAL ORDER, PERFORMED IN ~~LOC~~ HOSPITAL LAB): SARS Coronavirus 2: NEGATIVE

## 2020-06-12 LAB — GLUCOSE, CAPILLARY: Glucose-Capillary: 136 mg/dL — ABNORMAL HIGH (ref 70–99)

## 2020-06-12 MED ORDER — PRAVASTATIN SODIUM 20 MG PO TABS
20.0000 mg | ORAL_TABLET | Freq: Every day | ORAL | Status: DC
  Administered 2020-06-13 – 2020-06-14 (×2): 20 mg via ORAL
  Filled 2020-06-12 (×3): qty 1

## 2020-06-12 MED ORDER — TRAZODONE HCL 50 MG PO TABS: 25.0000 mg | ORAL_TABLET | Freq: Every evening | ORAL | Status: DC | PRN

## 2020-06-12 MED ORDER — ONDANSETRON HCL 4 MG PO TABS: 4.0000 mg | ORAL_TABLET | Freq: Four times a day (QID) | ORAL | Status: DC | PRN

## 2020-06-12 MED ORDER — ACETAMINOPHEN 650 MG RE SUPP: 650.0000 mg | Freq: Four times a day (QID) | RECTAL | Status: DC | PRN

## 2020-06-12 MED ORDER — ONDANSETRON HCL 4 MG/2ML IJ SOLN: 4.0000 mg | Freq: Four times a day (QID) | INTRAMUSCULAR | Status: DC | PRN

## 2020-06-12 MED ORDER — PANTOPRAZOLE SODIUM 40 MG IV SOLR: 40.0000 mg | Freq: Two times a day (BID) | INTRAVENOUS | Status: DC

## 2020-06-12 MED ORDER — TIOTROPIUM BROMIDE MONOHYDRATE 18 MCG IN CAPS
18.0000 ug | ORAL_CAPSULE | Freq: Every morning | RESPIRATORY_TRACT | Status: DC
  Administered 2020-06-14: 18 ug via RESPIRATORY_TRACT
  Filled 2020-06-12 (×3): qty 5

## 2020-06-12 MED ORDER — ENOXAPARIN SODIUM 40 MG/0.4ML ~~LOC~~ SOLN: 40.0000 mg | SUBCUTANEOUS | Status: DC

## 2020-06-12 MED ORDER — PANTOPRAZOLE SODIUM 40 MG IV SOLR
40.0000 mg | Freq: Two times a day (BID) | INTRAVENOUS | Status: DC
  Administered 2020-06-13 – 2020-06-15 (×5): 40 mg via INTRAVENOUS
  Filled 2020-06-12 (×5): qty 40

## 2020-06-12 MED ORDER — SODIUM CHLORIDE 0.9 % IV SOLN
10.0000 mL/h | Freq: Once | INTRAVENOUS | Status: AC
  Administered 2020-06-12: 10 mL/h via INTRAVENOUS

## 2020-06-12 MED ORDER — LIDOCAINE 5 % EX PTCH
1.0000 | MEDICATED_PATCH | CUTANEOUS | Status: DC
  Administered 2020-06-12 – 2020-06-14 (×3): 1 via TRANSDERMAL
  Filled 2020-06-12 (×4): qty 1

## 2020-06-12 MED ORDER — GABAPENTIN 300 MG PO CAPS
300.0000 mg | ORAL_CAPSULE | Freq: Two times a day (BID) | ORAL | Status: DC
  Administered 2020-06-13 – 2020-06-15 (×4): 300 mg via ORAL
  Filled 2020-06-12 (×5): qty 1

## 2020-06-12 MED ORDER — LISINOPRIL 10 MG PO TABS
10.0000 mg | ORAL_TABLET | Freq: Every day | ORAL | Status: DC
  Administered 2020-06-14 – 2020-06-15 (×2): 10 mg via ORAL
  Filled 2020-06-12 (×4): qty 1

## 2020-06-12 MED ORDER — MAGNESIUM HYDROXIDE 400 MG/5ML PO SUSP: 30.0000 mL | Freq: Every day | ORAL | Status: DC | PRN

## 2020-06-12 MED ORDER — METOPROLOL TARTRATE 50 MG PO TABS
100.0000 mg | ORAL_TABLET | Freq: Three times a day (TID) | ORAL | Status: DC
  Administered 2020-06-13 – 2020-06-15 (×7): 100 mg via ORAL
  Filled 2020-06-12 (×7): qty 2

## 2020-06-12 MED ORDER — ACETAMINOPHEN 325 MG PO TABS: 650.0000 mg | ORAL_TABLET | Freq: Four times a day (QID) | ORAL | Status: DC | PRN

## 2020-06-12 MED ORDER — PANTOPRAZOLE SODIUM 40 MG IV SOLR
40.0000 mg | Freq: Two times a day (BID) | INTRAVENOUS | Status: DC
  Administered 2020-06-12: 40 mg via INTRAVENOUS
  Filled 2020-06-12: qty 40

## 2020-06-12 MED ORDER — ALBUTEROL SULFATE (2.5 MG/3ML) 0.083% IN NEBU: 2.5000 mg | INHALATION_SOLUTION | RESPIRATORY_TRACT | Status: DC | PRN

## 2020-06-12 MED ORDER — TRAMADOL HCL 50 MG PO TABS: 50.0000 mg | ORAL_TABLET | Freq: Four times a day (QID) | ORAL | Status: DC | PRN

## 2020-06-12 MED ORDER — DICLOFENAC SODIUM 1 % EX GEL: 2.0000 g | Freq: Four times a day (QID) | CUTANEOUS | Status: DC | PRN

## 2020-06-12 MED ORDER — DULAGLUTIDE 1.5 MG/0.5ML ~~LOC~~ SOAJ: 1.5000 mg | SUBCUTANEOUS | Status: DC

## 2020-06-12 MED ORDER — INSULIN ASPART PROT & ASPART (70-30 MIX) 100 UNIT/ML ~~LOC~~ SUSP
30.0000 [IU] | Freq: Two times a day (BID) | SUBCUTANEOUS | Status: DC
  Administered 2020-06-12: 30 [IU] via SUBCUTANEOUS
  Filled 2020-06-12: qty 10

## 2020-06-12 MED ORDER — OXYCODONE HCL 5 MG PO TABS
5.0000 mg | ORAL_TABLET | Freq: Once | ORAL | Status: AC
  Administered 2020-06-12: 5 mg via ORAL
  Filled 2020-06-12: qty 1

## 2020-06-12 MED ORDER — SODIUM CHLORIDE 0.9 % IV SOLN: INTRAVENOUS | Status: DC

## 2020-06-12 NOTE — ED Notes (Signed)
ED Provider at bedside. 

## 2020-06-12 NOTE — ED Notes (Signed)
Pt states coming in after her provider told her to come in due to a lower GI bleed. Pt states she had a stent placed 05/15/20 and since has had black stool. Pt states she swallowed a capsul and they found a bleed and told her to come to the ER. Pt is now sitting in room, resting on bed. Pt states back pain since swallowing the capsul.

## 2020-06-12 NOTE — ED Triage Notes (Addendum)
Patient reports she was sent by GI due to bleeding in lower intestines. Found by capsule study yesterday.  Patient reports she has been on Plavix and ASA post cardiac cath on 5/20. States she is having lower back pain but denies abdominal pain.   Patient was seen at [facility] on 6/14 and given an iron infusion. Hgb at that time was 7.5

## 2020-06-12 NOTE — ED Notes (Signed)
Hard copy signed by pt and RN for blood transfusion.

## 2020-06-12 NOTE — ED Provider Notes (Signed)
[facility] Emergency Department Provider Note  ____________________________________________   First MD Initiated Contact with Patient 06/12/20 1900     (approximate)  I have reviewed the triage vital signs and the nursing notes.   HISTORY  Chief Complaint GI Bleeding    HPI Joyce Potter is a 74 y.o. female with coronary disease, hypertension, hyperlipidemia who comes in from GI due to active bleeding in the lower intestines.  Patient a capsule study done yesterday.  Patient is on Plavix and aspirin post cardiac cath on 5/20.  Patient did have a stent placed.  Since that procedure she has had black stool.  She had colonscopy and endoscopy that were negative.  Theyn she had the capsule study that was positive.  This was done by Dr. Bonna Gains- there was active bleeding in her small bowel.  Patient does report a little bit of low back pain that happened ever since the capsule study.  Is mild, constant, nothing makes better, nothing makes it worse.  Denies any abdominal pain associated with it.  The pain is not focused on one side of her back.  Denies any falls      Past Medical History:  Diagnosis Date  . Anemia    iron deficiency  . Aortic stenosis   . Basal cell carcinoma   . CAD (coronary artery disease)    s/p Left circumflex stent in 2012  . Carotid stenosis   . Cataract   . High cholesterol   . HTN (hypertension)   . Hyperlipidemia   . IDDM (insulin dependent diabetes mellitus)   . Multiple thyroid nodules    Benign  . Peptic ulcer disease   . Retinal artery occlusion    on left    Patient Active Problem List   Diagnosis Date Noted  . Diabetes mellitus without complication ([place]) 67/61/9509  . Anemia due to blood loss 05/20/2020  . Blood loss anemia   . Chronic fatigue 03/21/2020  . Diabetes mellitus type 2 in obese ([place]) 03/21/2020  . Obesity, Class II, BMI 35-39.9 03/21/2020  . Snoring 03/21/2020  . Background diabetic retinopathy  associated with type 2 diabetes mellitus ([place]) 06/06/2019  . Uncontrolled type 2 diabetes mellitus with hyperglycemia ([employer]) 04/30/2019  . Osteoporosis, post-menopausal 04/30/2019  . Multiple rib fractures 10/11/2018  . GIB (gastrointestinal bleeding) 07/17/2018  . Melena   . Symptomatic anemia   . PVD (peripheral vascular disease) ([place]) 02/11/2017  . Iron deficiency anemia due to chronic blood loss 07/13/2016  . Multinodular goiter 01/25/2016  . Central retinal artery occlusion 10/01/2015  . Carotid stenosis 10/01/2015  . Stroke ([place]) 09/28/2015  . Aortic stenosis, moderate 11/29/2014  . PUD (peptic ulcer disease) 07/17/2014  . HLD (hyperlipidemia) 05/07/2013  . GI bleed from an occult source with recurrent chronic blood loss anemia 04/14/2013  . CAD (coronary artery disease) 04/14/2013  . IDDM (insulin dependent diabetes mellitus) 04/14/2013  . HTN (hypertension) 04/14/2013  . Chest pain 04/14/2013    Past Surgical History:  Procedure Laterality Date  . ABDOMINAL HYSTERECTOMY    . ARTERY BIOPSY Left 11/19/2015   Procedure: BIOPSY TEMPORAL ARTERY;  Surgeon: Algernon Huxley, MD;  Location: [facility] ORS;  Service: Vascular;  Laterality: Left;  . BREAST BIOPSY Left 2002   core- neg  . BREAST EXCISIONAL BIOPSY    . CARDIAC CATHETERIZATION N/A 08/08/2015   Procedure: Right and Left Heart Cath and Coronary Angiography;  Surgeon: Teodoro Spray, MD;  Location: [place] [facility];  Service: Cardiovascular;  Laterality: N/A;  . CARDIAC CATHETERIZATION Bilateral 09/03/2016   Procedure: Right/Left Heart Cath and Coronary Angiography;  Surgeon: Teodoro Spray, MD;  Location: [place] [facility];  Service: Cardiovascular;  Laterality: Bilateral;  . CATARACT EXTRACTION W/ INTRAOCULAR LENS  IMPLANT, BILATERAL    . COLONOSCOPY     in 2013- internal hemorrhoids  . COLONOSCOPY WITH PROPOFOL N/A 07/07/2018   Procedure: COLONOSCOPY WITH PROPOFOL;  Surgeon: Manya Silvas, MD;  Location: [facility] ENDOSCOPY;   Service: Endoscopy;  Laterality: N/A;  . COLONOSCOPY WITH PROPOFOL N/A 05/22/2020   Procedure: COLONOSCOPY WITH PROPOFOL;  Surgeon: Jonathon Bellows, MD;  Location: [facility] ENDOSCOPY;  Service: Gastroenterology;  Laterality: N/A;  . CORONARY ANGIOGRAPHY N/A 09/14/2017   Procedure: CORONARY ANGIOGRAPHY;  Surgeon: Teodoro Spray, MD;  Location: [place] [facility] LAB;  Service: Cardiovascular;  Laterality: N/A;  . CORONARY ANGIOPLASTY    . CORONARY STENT INTERVENTION N/A 05/15/2020   Procedure: coronary angiography;  Surgeon: Teodoro Spray, MD;  Location: [place] [facility] LAB;  Service: Cardiovascular;  Laterality: N/A;  . CORONARY STENT INTERVENTION N/A 05/15/2020   Procedure: CORONARY STENT INTERVENTION;  Surgeon: Isaias Cowman, MD;  Location: [place] [facility];  Service: Cardiovascular;  Laterality: N/A;  . CORONARY STENT PLACEMENT    . ENDARTERECTOMY Left 10/01/2015   Procedure: ENDARTERECTOMY CAROTID;  Surgeon: Algernon Huxley, MD;  Location: [facility] ORS;  Service: Vascular;  Laterality: Left;  . ESOPHAGOGASTRODUODENOSCOPY    . ESOPHAGOGASTRODUODENOSCOPY (EGD) WITH PROPOFOL N/A 05/31/2016   Procedure: ESOPHAGOGASTRODUODENOSCOPY (EGD) WITH PROPOFOL;  Surgeon: Manya Silvas, MD;  Location: [facility] ENDOSCOPY;  Service: Endoscopy;  Laterality: N/A;  . ESOPHAGOGASTRODUODENOSCOPY (EGD) WITH PROPOFOL N/A 07/07/2018   Procedure: ESOPHAGOGASTRODUODENOSCOPY (EGD) WITH PROPOFOL;  Surgeon: Manya Silvas, MD;  Location: [facility] ENDOSCOPY;  Service: Endoscopy;  Laterality: N/A;  . ESOPHAGOGASTRODUODENOSCOPY (EGD) WITH PROPOFOL N/A 07/17/2018   Procedure: ESOPHAGOGASTRODUODENOSCOPY (EGD) WITH PROPOFOL;  Surgeon: Lucilla Lame, MD;  Location: [facility] ENDOSCOPY;  Service: Endoscopy;  Laterality: N/A;  . ESOPHAGOGASTRODUODENOSCOPY (EGD) WITH PROPOFOL N/A 05/21/2020   Procedure: ESOPHAGOGASTRODUODENOSCOPY (EGD) WITH PROPOFOL;  Surgeon: Jonathon Bellows, MD;  Location: [facility] ENDOSCOPY;  Service: Gastroenterology;  Laterality: N/A;  .  EYE SURGERY    . GIVENS CAPSULE STUDY N/A 04/17/2013   Procedure: GIVENS CAPSULE STUDY;  Surgeon: Arta Silence, MD;  Location: [facility] ENDOSCOPY;  Service: Endoscopy;  Laterality: N/A;  patient ate breakfast at 7am   . HEMORRHOID BANDING  07/27/2016   Dr Viona Gilmore. Tamala Julian  . PARTIAL HYSTERECTOMY    . RIGHT AND LEFT HEART CATH Bilateral 09/14/2017   Procedure: RIGHT AND LEFT HEART CATH;  Surgeon: Teodoro Spray, MD;  Location: [place] [facility] LAB;  Service: Cardiovascular;  Laterality: Bilateral;  . RIGHT/LEFT HEART CATH AND CORONARY ANGIOGRAPHY Right 05/15/2020   Procedure: Right/Left Heart cath;  Surgeon: Teodoro Spray, MD;  Location: [place] [facility] LAB;  Service: Cardiovascular;  Laterality: Right;  . TRIGGER FINGER RELEASE      Prior to Admission medications   Medication Sig Start Date End Date Taking? Authorizing Provider  albuterol (PROAIR HFA) 108 (90 Base) MCG/ACT inhaler Inhale 2 puffs into the lungs every 4 (four) hours as needed for wheezing or shortness of breath.     [provider]  alendronate (FOSAMAX) 70 MG tablet Take 70 mg by mouth once a week.     [provider]  amitriptyline (ELAVIL) 25 MG tablet Take 25 mg by mouth at bedtime as needed for sleep.  [provider]  clopidogrel (PLAVIX) 75 MG tablet Take 1 tablet (75 mg total) by mouth daily with breakfast. 05/16/20   [name], Sheppard Coil, MD  diclofenac Sodium (VOLTAREN) 1 % GEL Apply 2 g topically 4 (four) times daily as needed (pain).    [provider]  gabapentin (NEURONTIN) 300 MG capsule Take 300 mg by mouth 2 (two) times daily.  04/30/19   [provider]  glucose blood (ONETOUCH VERIO) test strip USE TO CHECK BLOOD SUGAR THREE TIMES DAILY AS INSTRUCTED 05/28/20   [provider]  insulin NPH-regular Human (NOVOLIN 70/30) (70-30) 100 UNIT/ML injection Inject 60-80 Units into the skin See admin instructions. Inject 80 units subcutaneously every morning and 60 units every  afternoon    [provider]  Insulin Syringe-Needle U-100 (INSULIN SYRINGE 1CC/30GX5/16") 30G X 5/16" 1 ML MISC USE ONE TWICE DAILY WITH INSULIN 03/18/17   [provider]  isosorbide mononitrate (IMDUR) 30 MG 24 hr tablet Take 30 mg by mouth daily.    [provider]  lisinopril (ZESTRIL) 10 MG tablet Take 10 mg by mouth daily. 12/18/19   [provider]  metoprolol tartrate (LOPRESSOR) 100 MG tablet Take 100 mg by mouth 2 (two) times daily.  10/02/18   [provider]  pantoprazole (PROTONIX) 40 MG tablet Take 40 mg by mouth daily. 05/17/20   [provider]  pravastatin (PRAVACHOL) 20 MG tablet Take 20 mg by mouth at bedtime. 05/01/20   [provider]  SPIRIVA HANDIHALER 18 MCG inhalation capsule  06/02/20   [provider]  traMADol (ULTRAM) 50 MG tablet Take 50 mg by mouth every 6 (six) hours as needed for moderate pain.    [provider]  TRULICITY 1.5 YK/5.9DJ SOPN Inject 0.5 mLs into the skin once a week. 03/21/20   [provider]    Allergies Invokamet [canagliflozin-metformin hcl], Sulfa antibiotics, Aspirin, Lovastatin, Penicillins, and Vicodin [hydrocodone-acetaminophen]  Family History  Problem Relation Age of Onset  . Uterine cancer Mother   . Breast cancer Mother 61  . Seizures Father   . Stroke Father   . Diabetes Father   . COPD Father   . Colon cancer Neg Hx     Social History Social History   Tobacco Use  . Smoking status: Former Smoker    Quit date: 02/15/1985    Years since quitting: 35.3  . Smokeless tobacco: Never Used  . Tobacco comment: quit about 31 years ago  Vaping Use  . Vaping Use: Never used  Substance Use Topics  . Alcohol use: No    Alcohol/week: 0.0 standard drinks  . Drug use: No      Review of Systems Constitutional: No fever/chills Eyes: No visual changes. ENT: No sore throat. Cardiovascular: Denies chest pain. Respiratory: Denies shortness of  breath. Gastrointestinal: No abdominal pain.  No nausea, no vomiting.  No diarrhea.  No constipation.  Positive for dark stool Genitourinary: Negative for dysuria. Musculoskeletal: Positive for back pain Skin: Negative for rash. Neurological: Negative for headaches, focal weakness or numbness. All other ROS negative ____________________________________________   PHYSICAL EXAM:  VITAL SIGNS: ED Triage Vitals  Enc Vitals Group     BP 06/12/20 1334 (!) 157/71     Pulse Rate 06/12/20 1334 88     Resp 06/12/20 1334 16     Temp 06/12/20 1334 98.9 F (37.2 C)     Temp Source 06/12/20 1334 Oral     SpO2 06/12/20 1334 97 %  Weight 06/12/20 1335 196 lb (88.9 kg)     Height 06/12/20 1335 5\' 3"  (1.6 m)     Head Circumference --      Peak Flow --      Pain Score 06/12/20 1335 5     Pain Loc --      Pain Edu? --      Excl. in [place]? --     Constitutional: Alert and oriented. Well appearing and in no acute distress. Eyes: Conjunctivae are normal. EOMI. Head: Atraumatic. Nose: No congestion/rhinnorhea. Mouth/Throat: Mucous membranes are moist.   Neck: No stridor. Trachea Midline. FROM Cardiovascular: Normal rate, regular rhythm. Grossly normal heart sounds.  Good peripheral circulation. Respiratory: Normal respiratory effort.  No retractions. Lungs CTAB. Gastrointestinal: Soft and nontender. No distention. No abdominal bruits.  Musculoskeletal: No lower extremity tenderness nor edema.  No joint effusions. Neurologic:  Normal speech and language. No gross focal neurologic deficits are appreciated.  Skin:  Skin is warm, dry and intact. No rash noted. Psychiatric: Mood and affect are normal. Speech and behavior are normal. GU: Deferred  Back: No rash noted to the back, mild tenderness to the middle lower back. ____________________________________________   LABS (all labs ordered are listed, but only abnormal results are displayed)  Labs Reviewed  COMPREHENSIVE METABOLIC PANEL -  Abnormal; Notable for the following components:      Result Value   Glucose, Bld 162 (*)    All other components within normal limits  CBC - Abnormal; Notable for the following components:   RBC 2.61 (*)    Hemoglobin 6.8 (*)    HCT 22.6 (*)    RDW 17.1 (*)    All other components within normal limits  SARS CORONAVIRUS 2 BY RT PCR (HOSPITAL ORDER, [place] [facility])  PROTIME-INR  POC OCCULT BLOOD, ED  TYPE AND SCREEN  PREPARE RBC (CROSSMATCH)   ____________________________________________   PROCEDURES  Procedure(s) performed (including Critical Care):  .Critical Care Performed by: Vanessa Clarendon, MD Authorized by: Vanessa Lindenwold, MD   Critical care provider statement:    Critical care time (minutes):  45   Critical care was necessary to treat or prevent imminent or life-threatening deterioration of the following conditions: Anemia requiring transfusion.   Critical care was time spent personally by me on the following activities:  Discussions with consultants, evaluation of patient's response to treatment, examination of patient, ordering and performing treatments and interventions, ordering and review of laboratory studies, ordering and review of radiographic studies, pulse oximetry, re-evaluation of patient's condition, obtaining history from patient or surrogate and review of old charts     ____________________________________________   INITIAL IMPRESSION / [place] / ED COURSE  Joyce Potter was evaluated in Emergency Department on 06/12/2020 for the symptoms described in the history of present illness. She was evaluated in the context of the global COVID-19 pandemic, which necessitated consideration that the patient might be at risk for infection with the SARS-CoV-2 virus that causes COVID-19. Institutional protocols and algorithms that pertain to the evaluation of patients at risk for COVID-19 are in a state of rapid change based on  information released by regulatory bodies including the CDC and federal and state organizations. These policies and algorithms were followed during the patient's care in the ED.    Patient is a 74 year old who comes in with active GI bleed.  Will get labs to evaluate for anemia.  Patient had endoscopy that did not show variceal bleeding or  ulcer. Some mild back tenderness, no rash. CT abd previously shows normal aorta size. No chestpain with it.  Patient states that she cannot take Tylenol or ibuprofen so we will give a small dose of oxycodone and lidocaine patch and continue to monitor.  Patient's hemoglobin is down to 6.8.  Will give 1 unit of blood.  Patient is comfortable getting blood transfusion.  Discussed with Dr. Marius Ditch from GI who recommended twice daily PPI, full liquid diet and n.p.o. at midnight, admission to the hospitalist.  They will also be discussed with cardiology about whether or not that she continue Plavix long-term.   Discussed with the hospital and they will admit patient  ____________________________________________   FINAL CLINICAL IMPRESSION(S) / ED DIAGNOSES   Final diagnoses:  Gastrointestinal hemorrhage, unspecified gastrointestinal hemorrhage type  Symptomatic anemia      MEDICATIONS GIVEN DURING THIS VISIT:  Medications  lidocaine (LIDODERM) 5 % 1 patch (1 patch Transdermal Patch Applied 06/12/20 2007)  pantoprazole (PROTONIX) injection 40 mg (40 mg Intravenous Given 06/12/20 2012)  0.9 %  sodium chloride infusion (10 mL/hr Intravenous New Bag/Given 06/12/20 2017)  oxyCODONE (Oxy IR/ROXICODONE) immediate release tablet 5 mg (5 mg Oral Given 06/12/20 2009)     ED Discharge Orders    None       Note:  This document was prepared using Dragon voice recognition software and may include unintentional dictation errors.   Vanessa Owatonna, MD 06/12/20 2030

## 2020-06-12 NOTE — Telephone Encounter (Signed)
Per Dr. Michele Mcalpine secure chat: This pt has active bleeding in her small bowel and needs a push enteroscopy can you call her now and coordinate with trish to get her set up for tomorrow at [facility] has she restarted her plavix? if so let me know Also if she has been having any black stool or bleeding I'll have to think about what to do if she's on plavix already She needs to be admitted to the hospital. She has active bleeding and is on plavix. I will call the hospitalist to see if we can arrange direct admission  Called patient to let her know and she agreed on going to the hospital right away because she understood that she has active bleeding and on Plavix.

## 2020-06-12 NOTE — ED Notes (Signed)
As per pharmacy the 2200 dose of protonix not to be given, but they will adjust the times.

## 2020-06-12 NOTE — H&P (Signed)
[name] NAME: Joyce Potter    MR#:  [id number]  DATE OF BIRTH:  1946/03/26  DATE OF ADMISSION:  06/12/2020  PRIMARY CARE PHYSICIAN: [name], MD   REQUESTING/REFERRING PHYSICIAN: Marjean Donna, MD  CHIEF COMPLAINT:   Chief Complaint  Patient presents with  . GI Bleeding    HISTORY OF PRESENT ILLNESS:  Joyce Potter  is a 74 y.o. pleasant Caucasian female with a known history of multiple medical problems that are mentioned below, presented to the emergency room with acute onset of recurrent melena.  The patient had a PCI and cardiac stent on 5/20 at which time she was placed on Plavix added to her regular aspirin.  She has had work-up for melena that included EGD that was unremarkable and this was followed by capsule endoscopy that showed small bowel bleeding.  She was sent today by Dr. Bonna Gains to the ER.  She denies any abdominal pain but has been having belching and admitted to nausea.  She has been having mild low back pain.  No dysuria, or hematuria or urgency or frequency or flank pain.  She has been having diminished urine output though.  On presentation to the emergency room, blood pressure was 141/112 with otherwise normal vital signs.  BP later on was 142/60.  Labs revealed blood glucose 162 with otherwise unremarkable CMP.  CBC showed hemoglobin of 6.8 hematocrit 22.6 compared to 7.5 and 24 on 6/14 and 7.9 hemoglobin on 6/9.  She had normal INR of 1 and PT of 13.1.  She was typed and crossmatch and is being transfused 1 unit of packed red blood cells.  She is given 5 mg of p.o. oxycodone and 40 mg of IV Protonix as well as Lidoderm patch.  She will be admitted to a progressive unit bed for further evaluation and management.   PAST MEDICAL HISTORY:   Past Medical History:  Diagnosis Date  . Anemia    iron deficiency  . Aortic stenosis   . Basal cell carcinoma   . CAD (coronary artery disease)    s/p Left circumflex stent in  2012  . Carotid stenosis   . Cataract   . High cholesterol   . HTN (hypertension)   . Hyperlipidemia   . IDDM (insulin dependent diabetes mellitus)   . Multiple thyroid nodules    Benign  . Peptic ulcer disease   . Retinal artery occlusion    on left    PAST SURGICAL HISTORY:   Past Surgical History:  Procedure Laterality Date  . ABDOMINAL HYSTERECTOMY    . ARTERY BIOPSY Left 11/19/2015   Procedure: BIOPSY TEMPORAL ARTERY;  Surgeon: Algernon Huxley, MD;  Location: [facility] ORS;  Service: Vascular;  Laterality: Left;  . BREAST BIOPSY Left 2002   core- neg  . BREAST EXCISIONAL BIOPSY    . CARDIAC CATHETERIZATION N/A 08/08/2015   Procedure: Right and Left Heart Cath and Coronary Angiography;  Surgeon: Teodoro Spray, MD;  Location: [place] [facility];  Service: Cardiovascular;  Laterality: N/A;  . CARDIAC CATHETERIZATION Bilateral 09/03/2016   Procedure: Right/Left Heart Cath and Coronary Angiography;  Surgeon: Teodoro Spray, MD;  Location: [place] [facility];  Service: Cardiovascular;  Laterality: Bilateral;  . CATARACT EXTRACTION W/ INTRAOCULAR LENS  IMPLANT, BILATERAL    . COLONOSCOPY     in 2013- internal hemorrhoids  . COLONOSCOPY WITH PROPOFOL N/A 07/07/2018   Procedure: COLONOSCOPY WITH PROPOFOL;  Surgeon: Gaylyn Cheers  T, MD;  Location: [facility] ENDOSCOPY;  Service: Endoscopy;  Laterality: N/A;  . COLONOSCOPY WITH PROPOFOL N/A 05/22/2020   Procedure: COLONOSCOPY WITH PROPOFOL;  Surgeon: Jonathon Bellows, MD;  Location: [facility] ENDOSCOPY;  Service: Gastroenterology;  Laterality: N/A;  . CORONARY ANGIOGRAPHY N/A 09/14/2017   Procedure: CORONARY ANGIOGRAPHY;  Surgeon: Teodoro Spray, MD;  Location: [place] [facility];  Service: Cardiovascular;  Laterality: N/A;  . CORONARY ANGIOPLASTY    . CORONARY STENT INTERVENTION N/A 05/15/2020   Procedure: coronary angiography;  Surgeon: Teodoro Spray, MD;  Location: [place] [facility];  Service: Cardiovascular;  Laterality: N/A;  . CORONARY STENT  INTERVENTION N/A 05/15/2020   Procedure: CORONARY STENT INTERVENTION;  Surgeon: Isaias Cowman, MD;  Location: [place] [facility];  Service: Cardiovascular;  Laterality: N/A;  . CORONARY STENT PLACEMENT    . ENDARTERECTOMY Left 10/01/2015   Procedure: ENDARTERECTOMY CAROTID;  Surgeon: Algernon Huxley, MD;  Location: [facility] ORS;  Service: Vascular;  Laterality: Left;  . ESOPHAGOGASTRODUODENOSCOPY    . ESOPHAGOGASTRODUODENOSCOPY (EGD) WITH PROPOFOL N/A 05/31/2016   Procedure: ESOPHAGOGASTRODUODENOSCOPY (EGD) WITH PROPOFOL;  Surgeon: Manya Silvas, MD;  Location: [facility] ENDOSCOPY;  Service: Endoscopy;  Laterality: N/A;  . ESOPHAGOGASTRODUODENOSCOPY (EGD) WITH PROPOFOL N/A 07/07/2018   Procedure: ESOPHAGOGASTRODUODENOSCOPY (EGD) WITH PROPOFOL;  Surgeon: Manya Silvas, MD;  Location: [facility] ENDOSCOPY;  Service: Endoscopy;  Laterality: N/A;  . ESOPHAGOGASTRODUODENOSCOPY (EGD) WITH PROPOFOL N/A 07/17/2018   Procedure: ESOPHAGOGASTRODUODENOSCOPY (EGD) WITH PROPOFOL;  Surgeon: Lucilla Lame, MD;  Location: [facility] ENDOSCOPY;  Service: Endoscopy;  Laterality: N/A;  . ESOPHAGOGASTRODUODENOSCOPY (EGD) WITH PROPOFOL N/A 05/21/2020   Procedure: ESOPHAGOGASTRODUODENOSCOPY (EGD) WITH PROPOFOL;  Surgeon: Jonathon Bellows, MD;  Location: [facility] ENDOSCOPY;  Service: Gastroenterology;  Laterality: N/A;  . EYE SURGERY    . GIVENS CAPSULE STUDY N/A 04/17/2013   Procedure: GIVENS CAPSULE STUDY;  Surgeon: Arta Silence, MD;  Location: [facility] ENDOSCOPY;  Service: Endoscopy;  Laterality: N/A;  patient ate breakfast at 7am   . HEMORRHOID BANDING  07/27/2016   Dr Viona Gilmore. Tamala Julian  . PARTIAL HYSTERECTOMY    . RIGHT AND LEFT HEART CATH Bilateral 09/14/2017   Procedure: RIGHT AND LEFT HEART CATH;  Surgeon: Teodoro Spray, MD;  Location: [place] [facility];  Service: Cardiovascular;  Laterality: Bilateral;  . RIGHT/LEFT HEART CATH AND CORONARY ANGIOGRAPHY Right 05/15/2020   Procedure: Right/Left Heart cath;  Surgeon: Teodoro Spray, MD;  Location:  [place] [facility] LAB;  Service: Cardiovascular;  Laterality: Right;  . TRIGGER FINGER RELEASE      SOCIAL HISTORY:   Social History   Tobacco Use  . Smoking status: Former Smoker    Quit date: 02/15/1985    Years since quitting: 35.3  . Smokeless tobacco: Never Used  . Tobacco comment: quit about 31 years ago  Substance Use Topics  . Alcohol use: No    Alcohol/week: 0.0 standard drinks    FAMILY HISTORY:   Family History  Problem Relation Age of Onset  . Uterine cancer Mother   . Breast cancer Mother 44  . Seizures Father   . Stroke Father   . Diabetes Father   . COPD Father   . Colon cancer Neg Hx     DRUG ALLERGIES:   Allergies  Allergen Reactions  . Invokamet [Canagliflozin-Metformin Hcl] Other (See Comments)    Yeast infection  . Sulfa Antibiotics Other (See Comments)    "Got Drunk"  . Aspirin Other (See Comments)    ulcers  . Lovastatin Rash  .  Penicillins Rash    Has patient had a PCN reaction causing immediate rash, facial/tongue/throat swelling, SOB or lightheadedness with hypotension:Yes Has patient had a PCN reaction causing severe rash involving mucus membranes or skin necrosis:all over body Has patient had a PCN reaction that required hospitalization: No Has patient had a PCN reaction occurring within the last 10 years: No If all of the above answers are "NO", then may proceed with Cephalosporin use.   . Vicodin [Hydrocodone-Acetaminophen] Rash    REVIEW OF SYSTEMS:   ROS As per history of present illness. All pertinent systems were reviewed above. Constitutional,  HEENT, cardiovascular, respiratory, GI, GU, musculoskeletal, neuro, psychiatric, endocrine,  integumentary and hematologic systems were reviewed and are otherwise  negative/unremarkable except for positive findings mentioned above in the HPI.   MEDICATIONS AT HOME:   Prior to Admission medications   Medication Sig Start Date End Date Taking? Authorizing Provider  clopidogrel  (PLAVIX) 75 MG tablet Take 1 tablet (75 mg total) by mouth daily with breakfast. 05/16/20  Yes [name], [name], MD  doxycycline (VIBRA-TABS) 100 MG tablet Take 100 mg by mouth 2 (two) times daily.   Yes [provider]  lisinopril (ZESTRIL) 10 MG tablet Take 10 mg by mouth daily. 12/18/19  Yes [provider]  metoprolol tartrate (LOPRESSOR) 100 MG tablet Take 100 mg by mouth in the morning, at noon, and at bedtime.   Yes [provider]  pantoprazole (PROTONIX) 40 MG tablet Take 40 mg by mouth daily. 05/17/20  Yes [provider]  pravastatin (PRAVACHOL) 20 MG tablet Take 20 mg by mouth at bedtime. 05/01/20  Yes [provider]  SPIRIVA HANDIHALER 18 MCG inhalation capsule  06/02/20  Yes [provider]  traMADol (ULTRAM) 50 MG tablet Take 50 mg by mouth every 6 (six) hours as needed for moderate pain.   Yes [provider]  TRULICITY 1.5 KV/4.2VZ SOPN Inject 0.5 mLs into the skin once a week. 03/21/20  Yes [provider]  albuterol (PROAIR HFA) 108 (90 Base) MCG/ACT inhaler Inhale 2 puffs into the lungs every 4 (four) hours as needed for wheezing or shortness of breath.     [provider]  alendronate (FOSAMAX) 70 MG tablet Take 70 mg by mouth once a week.  Patient not taking: Reported on 06/12/2020    [provider]  amitriptyline (ELAVIL) 25 MG tablet Take 25 mg by mouth at bedtime as needed for sleep.    [provider]  diclofenac Sodium (VOLTAREN) 1 % GEL Apply 2 g topically 4 (four) times daily as needed (pain).    [provider]  gabapentin (NEURONTIN) 300 MG capsule Take 300 mg by mouth 2 (two) times daily.  Patient not taking: Reported on 06/12/2020 04/30/19   [provider]  insulin NPH-regular Human (NOVOLIN 70/30) (70-30) 100 UNIT/ML injection Inject 60-80 Units into the skin See admin instructions. Inject 80 units subcutaneously every morning and 60 units every afternoon     [provider]  Insulin Syringe-Needle U-100 (INSULIN SYRINGE 1CC/30GX5/16") 30G X 5/16" 1 ML MISC USE ONE TWICE DAILY WITH INSULIN 03/18/17   [provider]  isosorbide mononitrate (IMDUR) 30 MG 24 hr tablet Take 30 mg by mouth daily. Patient not taking: Reported on 06/12/2020    [provider]      VITAL SIGNS:  Blood pressure (!) 142/60, pulse 85, temperature 98.2 F (36.8 C), resp. rate 18, height 5\' 3"  (1.6 m), weight 88.9 kg, SpO2 100 %.  PHYSICAL EXAMINATION:  Physical Exam  GENERAL:  74 y.o.-year-old pleasant Caucasian female patient lying in the bed with no acute distress.  EYES: Pupils equal, round, reactive to light and accommodation. No scleral icterus.  Positive pallor.  Extraocular muscles intact.  HEENT: Head atraumatic, normocephalic. Oropharynx and nasopharynx clear.  NECK:  Supple, no jugular venous distention. No thyroid enlargement, no tenderness.  LUNGS: Normal breath sounds bilaterally, no wheezing, rales,rhonchi or crepitation. No use of accessory muscles of respiration.  CARDIOVASCULAR: Regular rate and rhythm, S1, S2 normal. No murmurs, rubs, or gallops.  ABDOMEN: Soft, nondistended, nontender. Bowel sounds present. No organomegaly or mass.  EXTREMITIES: No pedal edema, cyanosis, or clubbing.  NEUROLOGIC: Cranial nerves II through XII are intact. Muscle strength 5/5 in all extremities. Sensation intact. Gait not checked.  PSYCHIATRIC: The patient is alert and oriented x 3.  Normal affect and good eye contact. SKIN: No obvious rash, lesion, or ulcer.   LABORATORY PANEL:   CBC Recent Labs  Lab 06/12/20 1348  WBC 5.2  HGB 6.8*  HCT 22.6*  PLT 316   ------------------------------------------------------------------------------------------------------------------  Chemistries  Recent Labs  Lab 06/12/20 1348  NA 140  K 4.2  CL 107  CO2 25  GLUCOSE 162*  BUN 19  CREATININE 0.83  CALCIUM 9.0  AST 21  ALT 18  ALKPHOS  71  BILITOT 0.6   ------------------------------------------------------------------------------------------------------------------  Cardiac Enzymes No results for input(s): TROPONINI in the last 168 hours. ------------------------------------------------------------------------------------------------------------------  RADIOLOGY:  No results found.    IMPRESSION AND PLAN:   1.  GI bleeding likely of upper GI etiology with evidence of small bowel bleeding by capsule endoscopy and subsequent acute on chronic blood loss anemia. -The patient will be admitted to a progressive unit bed. -She was typed and crossmatched will be transfused 1 unit of packed red blood cells. -We will follow posttransfusion H&H. -We will obtain a GI consultation. -Will be n.p.o. after midnight.  Clear liquids will be provided tonight. -I notified Dr. Marius Ditch about the patient. -We will hold her aspirin and Plavix for now.  We will hold her Fosamax as well.  2.  Coronary artery disease status post PCI and stent on Plavix. -Cardiology consultation will be obtained for follow-up.  3.  Type 2 diabetes mellitus with peripheral neuropathy. -The patient will be placed on supplement coverage with NovoLog and will continue her basal coverage at half home dose when she is n.p.o. -We will continue her Neurontin.  4.  Hypertension. -We will continue her Lopressor.  5.  Dyslipidemia. -We will continue her statin therapy.  6.  DVT prophylaxis. -SCDs. -Medical prophylaxis currently contraindicated due to GI bleeding.    All the records are reviewed and case discussed with ED provider. The plan of care was discussed in details with the patient (and family). I answered all questions. The patient agreed to proceed with the above mentioned plan. Further management will depend upon hospital course.   CODE STATUS: Full code  Status is: Inpatient  Remains inpatient appropriate because:Altered mental status,  Ongoing diagnostic testing needed not appropriate for outpatient work up, Unsafe d/c plan, IV treatments appropriate due to intensity of illness or inability to take PO and Inpatient level of care appropriate due to severity of illness   Dispo: The patient is from: Home              Anticipated d/c is to: Home              Anticipated d/c date  is: 2 days              Patient currently is not medically stable to d/c.   TOTAL TIME TAKING CARE OF THIS PATIENT: 55 minutes.    Christel Mormon M.D on 06/12/2020 at 8:50 PM  [facility]   From 7 PM-7 AM, contact night-coverage [email or web address]  CC: Primary care physician; [name], MD   Note: This dictation was prepared with Dragon dictation along with smaller phrase technology. Any transcriptional typo errors that result from this process are unintentional.

## 2020-06-12 NOTE — Patient Outreach (Signed)
[facility]) Care Management  06/12/2020  Joyce Potter October 26, 1946 [id number]   [place] coordination Noted in ED  Pt noted in ED  [facility] RN CM will re review Epic   Plan [facility] RN CM will follow up with patient within the next 3-7 business days pending disposition from ED   [place]. [name], RN, BSN, [place] Coordinator Office number [phone] Mobile number [phone]  Main [facility] number [phone] Fax number [phone]

## 2020-06-12 NOTE — ED Notes (Signed)
Provider notified of BP.

## 2020-06-12 NOTE — Telephone Encounter (Signed)
Capsule study read today, after images were downloaded this morning. Results sent for scanning  Active bleeding seen in the proximal small bowel, likely duodenum.   Pt was called and reports having melena and is on plavix.   She will need hospitalization for evaluation of upper endoscopy or push enteroscopy and cardiology consult for her plavix as well  Hospitalist called for direct admission but they do not have beds and recommend pt be sent to ER  Pt advised to go to the ER for the same  Please call GI on call once admitted

## 2020-06-13 ENCOUNTER — Inpatient Hospital Stay: Payer: Medicare HMO | Admitting: Anesthesiology

## 2020-06-13 ENCOUNTER — Other Ambulatory Visit: Payer: Self-pay

## 2020-06-13 ENCOUNTER — Encounter
Admission: EM | Disposition: A | Discharge status: Home / Self Care | Payer: Self-pay | Source: Home / Self Care | Attending: Internal Medicine

## 2020-06-13 ENCOUNTER — Encounter: Payer: Self-pay | Admitting: Gastroenterology

## 2020-06-13 DIAGNOSIS — I10 Essential (primary) hypertension: Secondary | ICD-10-CM

## 2020-06-13 HISTORY — PX: ESOPHAGOGASTRODUODENOSCOPY (EGD) WITH PROPOFOL: SHX5813

## 2020-06-13 LAB — BPAM RBC
Blood Product Expiration Date: 202107212359
ISSUE DATE / TIME: 202106172026
Unit Type and Rh: 5100

## 2020-06-13 LAB — TYPE AND SCREEN
ABO/RH(D): O POS
Antibody Screen: NEGATIVE
Unit division: 0

## 2020-06-13 LAB — HEMOGLOBIN AND HEMATOCRIT, BLOOD
HCT: 24.6 % — ABNORMAL LOW (ref 36.0–46.0)
HCT: 24.1 % — ABNORMAL LOW (ref 36.0–46.0)
HCT: 27.6 % — ABNORMAL LOW (ref 36.0–46.0)
Hemoglobin: 7.5 g/dL — ABNORMAL LOW (ref 12.0–15.0)
Hemoglobin: 7.9 g/dL — ABNORMAL LOW (ref 12.0–15.0)
Hemoglobin: 8.9 g/dL — ABNORMAL LOW (ref 12.0–15.0)

## 2020-06-13 LAB — CBC
HCT: 25.4 % — ABNORMAL LOW (ref 36.0–46.0)
Hemoglobin: 7.8 g/dL — ABNORMAL LOW (ref 12.0–15.0)
MCH: 26.4 pg (ref 26.0–34.0)
MCHC: 30.7 g/dL (ref 30.0–36.0)
MCV: 85.8 fL (ref 80.0–100.0)
Platelets: 287 10*3/uL (ref 150–400)
RBC: 2.96 MIL/uL — ABNORMAL LOW (ref 3.87–5.11)
RDW: 16.6 % — ABNORMAL HIGH (ref 11.5–15.5)
WBC: 5.2 10*3/uL (ref 4.0–10.5)
nRBC: 0 % (ref 0.0–0.2)

## 2020-06-13 LAB — GLUCOSE, CAPILLARY
Glucose-Capillary: 69 mg/dL — ABNORMAL LOW (ref 70–99)
Glucose-Capillary: 86 mg/dL (ref 70–99)
Glucose-Capillary: 110 mg/dL — ABNORMAL HIGH (ref 70–99)

## 2020-06-13 LAB — BASIC METABOLIC PANEL
Anion gap: 5 (ref 5–15)
BUN: 16 mg/dL (ref 8–23)
CO2: 26 mmol/L (ref 22–32)
Calcium: 8.3 mg/dL — ABNORMAL LOW (ref 8.9–10.3)
Chloride: 109 mmol/L (ref 98–111)
Creatinine, Ser: 0.8 mg/dL (ref 0.44–1.00)
GFR calc Af Amer: >60 mL/min (ref >60)
GFR calc non Af Amer: >60 mL/min (ref >60)
Glucose, Bld: 94 mg/dL (ref 70–99)
Potassium: 4 mmol/L (ref 3.5–5.1)
Sodium: 140 mmol/L (ref 135–145)

## 2020-06-13 SURGERY — ESOPHAGOGASTRODUODENOSCOPY (EGD) WITH PROPOFOL
Anesthesia: General

## 2020-06-13 MED ORDER — INSULIN ASPART 100 UNIT/ML ~~LOC~~ SOLN
0.0000 [IU] | Freq: Four times a day (QID) | SUBCUTANEOUS | Status: DC
  Administered 2020-06-14 – 2020-06-15 (×2): 2 [IU] via SUBCUTANEOUS
  Filled 2020-06-13 (×2): qty 1

## 2020-06-13 MED ORDER — PROPOFOL 10 MG/ML IV BOLUS
INTRAVENOUS | Status: DC | PRN
  Administered 2020-06-13: 50 mg via INTRAVENOUS

## 2020-06-13 MED ORDER — LIDOCAINE HCL (CARDIAC) PF 100 MG/5ML IV SOSY
PREFILLED_SYRINGE | INTRAVENOUS | Status: DC | PRN
  Administered 2020-06-13: 100 mg via INTRAVENOUS

## 2020-06-13 MED ORDER — LIDOCAINE HCL (PF) 2 % IJ SOLN
INTRAMUSCULAR | Status: AC
  Filled 2020-06-13: qty 5

## 2020-06-13 MED ORDER — MENTHOL 3 MG MT LOZG
1.0000 | LOZENGE | OROMUCOSAL | Status: DC | PRN
  Administered 2020-06-13: 3 mg via ORAL
  Filled 2020-06-13: qty 9

## 2020-06-13 MED ORDER — SACCHAROMYCES BOULARDII 250 MG PO CAPS
250.0000 mg | ORAL_CAPSULE | Freq: Two times a day (BID) | ORAL | Status: DC
  Administered 2020-06-13 – 2020-06-15 (×4): 250 mg via ORAL
  Filled 2020-06-13 (×6): qty 1

## 2020-06-13 MED ORDER — MUPIROCIN 2 % EX OINT
1.0000 "application " | TOPICAL_OINTMENT | Freq: Two times a day (BID) | CUTANEOUS | Status: DC
  Filled 2020-06-13: qty 22

## 2020-06-13 MED ORDER — PROPOFOL 10 MG/ML IV BOLUS
INTRAVENOUS | Status: AC
  Filled 2020-06-13: qty 20

## 2020-06-13 MED ORDER — PROPOFOL 500 MG/50ML IV EMUL
INTRAVENOUS | Status: DC | PRN
  Administered 2020-06-13: 150 ug/kg/min via INTRAVENOUS

## 2020-06-13 NOTE — Transfer of Care (Signed)
Immediate Anesthesia Transfer of Care Note  Patient: Joyce Potter  Procedure(s) Performed: ESOPHAGOGASTRODUODENOSCOPY (EGD) WITH PROPOFOL (N/A )  Patient Location: PACU and Endoscopy Unit  Anesthesia Type:General  Level of Consciousness: drowsy  Airway & Oxygen Therapy: Patient Spontanous Breathing and Patient connected to face mask oxygen  Post-op Assessment: Report given to RN and Post -op Vital signs reviewed and stable  Post vital signs: Reviewed and stable  Last Vitals:  Vitals Value Taken Time  BP 111/54 06/13/20 1354  Temp 36.3 C 06/13/20 1353  Pulse 77 06/13/20 1356  Resp 14 06/13/20 1356  SpO2 91 % 06/13/20 1356  Vitals shown include unvalidated device data.  Last Pain:  Vitals:   06/13/20 1353  TempSrc: Temporal  PainSc:          Complications: No complications documented.

## 2020-06-13 NOTE — ED Notes (Signed)
Provider notified of BP of 144/58

## 2020-06-13 NOTE — Consult Note (Signed)
Brief consult note:    This patient was admitted after having a capsule endoscopy which showed active bleeding in the duodenum. The patient is not having abdominal pain and she was found to have worsening anemia. She was admitted and set up for a push enteroscopy.

## 2020-06-13 NOTE — Anesthesia Preprocedure Evaluation (Signed)
Anesthesia Evaluation  Patient identified by MRN, date of birth, ID band Patient awake    Reviewed: Allergy & Precautions, NPO status , Patient's Chart, lab work & pertinent test results  History of Anesthesia Complications Negative for: history of anesthetic complications  Airway Mallampati: III  TM Distance: >3 FB Neck ROM: Full    Dental  (+) Upper Dentures, Lower Dentures   Pulmonary neg sleep apnea, neg COPD, former smoker,    breath sounds clear to auscultation- rhonchi (-) wheezing      Cardiovascular hypertension, + CAD, + Past MI, + Cardiac Stents (stent placed 05/16/20, prior stent 2014) and + Peripheral Vascular Disease   Rhythm:Regular Rate:Normal - Systolic murmurs and - Diastolic murmurs    Neuro/Psych neg Seizures CVA negative psych ROS   GI/Hepatic Neg liver ROS, PUD,   Endo/Other  diabetes, Insulin Dependent  Renal/GU negative Renal ROS     Musculoskeletal negative musculoskeletal ROS (+)   Abdominal (+) + obese,   Peds  Hematology  (+) anemia ,   Anesthesia Other Findings Past Medical History: No date: Anemia     Comment:  iron deficiency No date: Aortic stenosis No date: Basal cell carcinoma No date: CAD (coronary artery disease)     Comment:  s/p Left circumflex stent in 2012 No date: Carotid stenosis No date: Cataract No date: High cholesterol No date: HTN (hypertension) No date: Hyperlipidemia No date: IDDM (insulin dependent diabetes mellitus) No date: Multiple thyroid nodules     Comment:  Benign No date: Peptic ulcer disease No date: Retinal artery occlusion     Comment:  on left   Reproductive/Obstetrics                            Anesthesia Physical  Anesthesia Plan  ASA: III  Anesthesia Plan: General   Post-op Pain Management:    Induction: Intravenous  PONV Risk Score and Plan: 2 and Propofol infusion  Airway Management Planned: Natural  Airway  Additional Equipment:   Intra-op Plan:   Post-operative Plan:   Informed Consent: I have reviewed the patients History and Physical, chart, labs and discussed the procedure including the risks, benefits and alternatives for the proposed anesthesia with the patient or authorized representative who has indicated his/her understanding and acceptance.     Dental advisory given  Plan Discussed with: CRNA and Anesthesiologist  Anesthesia Plan Comments: (Discussed with patient high risk for cardiac problems given recent stent placement)        Anesthesia Quick Evaluation

## 2020-06-13 NOTE — Anesthesia Postprocedure Evaluation (Signed)
Anesthesia Post Note  Patient: Joyce Potter  Procedure(s) Performed: ESOPHAGOGASTRODUODENOSCOPY (EGD) WITH PROPOFOL (N/A )  Patient location during evaluation: Endoscopy Anesthesia Type: General Level of consciousness: awake and alert and oriented Pain management: pain level controlled Vital Signs Assessment: post-procedure vital signs reviewed and stable Respiratory status: spontaneous breathing, nonlabored ventilation and respiratory function stable Cardiovascular status: blood pressure returned to baseline and stable Postop Assessment: no signs of nausea or vomiting Anesthetic complications: no   No complications documented.   Last Vitals:  Vitals:   06/13/20 1300 06/13/20 1353  BP: (!) 130/59   Pulse: 84   Resp: 20   Temp: (!) 36.1 C (!) 36.3 C  SpO2: 97%     Last Pain:  Vitals:   06/13/20 1353  TempSrc: Temporal  PainSc:                  Joyce Potter

## 2020-06-13 NOTE — Consult Note (Signed)
[facility] Cardiology  CARDIOLOGY CONSULT NOTE  Patient ID: Joyce Potter MRN: [id number] DOB/AGE: 01/08/1946 74 y.o.  Admit date: 06/12/2020 Referring Physician [facility] Primary Physician [facility] Cardiologist Fath Reason for Consultation coronary artery disease  HPI: 74 year old female referred for management of coronary artery disease during GI bleed.  Patient underwent recent cardiac catheterization 05/15/2020 which revealed patent stent left circumflex and 95% stenosis proximal RCA with normal left ventricular function and moderate aortic stenosis.  The patient underwent PCI, receiving 2.5 x 26 mm Resolute Onyx stent proximal RCA.  She presents with recent history of recurrent melena, with GI work-up occluding EGD which was unremarkable and capsule endoscopy which revealed small bowel bleeding.  Patient was transfused 1 unit of RBCs.  Hemoglobin and hematocrit 7.8 and 25.4, respectively.  Patient denies chest pain or shortness of breath.  Aspirin and Plavix are being held, awaiting further GI instructions.  Review of systems complete and found to be negative unless listed above     Past Medical History:  Diagnosis Date  . Anemia    iron deficiency  . Aortic stenosis   . Basal cell carcinoma   . CAD (coronary artery disease)    s/p Left circumflex stent in 2012  . Carotid stenosis   . Cataract   . High cholesterol   . HTN (hypertension)   . Hyperlipidemia   . IDDM (insulin dependent diabetes mellitus)   . Multiple thyroid nodules    Benign  . Peptic ulcer disease   . Retinal artery occlusion    on left    Past Surgical History:  Procedure Laterality Date  . ABDOMINAL HYSTERECTOMY    . ARTERY BIOPSY Left 11/19/2015   Procedure: BIOPSY TEMPORAL ARTERY;  Surgeon: Algernon Huxley, MD;  Location: [facility] ORS;  Service: Vascular;  Laterality: Left;  . BREAST BIOPSY Left 2002   core- neg  . BREAST EXCISIONAL BIOPSY    . CARDIAC CATHETERIZATION N/A 08/08/2015   Procedure: Right and Left  Heart Cath and Coronary Angiography;  Surgeon: Teodoro Spray, MD;  Location: [place] CV LAB;  Service: Cardiovascular;  Laterality: N/A;  . CARDIAC CATHETERIZATION Bilateral 09/03/2016   Procedure: Right/Left Heart Cath and Coronary Angiography;  Surgeon: Teodoro Spray, MD;  Location: [place] CV LAB;  Service: Cardiovascular;  Laterality: Bilateral;  . CATARACT EXTRACTION W/ INTRAOCULAR LENS  IMPLANT, BILATERAL    . COLONOSCOPY     in 2013- internal hemorrhoids  . COLONOSCOPY WITH PROPOFOL N/A 07/07/2018   Procedure: COLONOSCOPY WITH PROPOFOL;  Surgeon: Manya Silvas, MD;  Location: [facility] ENDOSCOPY;  Service: Endoscopy;  Laterality: N/A;  . COLONOSCOPY WITH PROPOFOL N/A 05/22/2020   Procedure: COLONOSCOPY WITH PROPOFOL;  Surgeon: Jonathon Bellows, MD;  Location: [facility] ENDOSCOPY;  Service: Gastroenterology;  Laterality: N/A;  . CORONARY ANGIOGRAPHY N/A 09/14/2017   Procedure: CORONARY ANGIOGRAPHY;  Surgeon: Teodoro Spray, MD;  Location: [place] CV LAB;  Service: Cardiovascular;  Laterality: N/A;  . CORONARY ANGIOPLASTY    . CORONARY STENT INTERVENTION N/A 05/15/2020   Procedure: coronary angiography;  Surgeon: Teodoro Spray, MD;  Location: [place] CV LAB;  Service: Cardiovascular;  Laterality: N/A;  . CORONARY STENT INTERVENTION N/A 05/15/2020   Procedure: CORONARY STENT INTERVENTION;  Surgeon: Isaias Cowman, MD;  Location: [place] CV LAB;  Service: Cardiovascular;  Laterality: N/A;  . CORONARY STENT PLACEMENT    . ENDARTERECTOMY Left 10/01/2015   Procedure: ENDARTERECTOMY CAROTID;  Surgeon: Algernon Huxley, MD;  Location: [facility] ORS;  Service: Vascular;  Laterality: Left;  . ESOPHAGOGASTRODUODENOSCOPY    . ESOPHAGOGASTRODUODENOSCOPY (EGD) WITH PROPOFOL N/A 05/31/2016   Procedure: ESOPHAGOGASTRODUODENOSCOPY (EGD) WITH PROPOFOL;  Surgeon: Manya Silvas, MD;  Location: [facility] ENDOSCOPY;  Service: Endoscopy;  Laterality: N/A;  . ESOPHAGOGASTRODUODENOSCOPY (EGD) WITH  PROPOFOL N/A 07/07/2018   Procedure: ESOPHAGOGASTRODUODENOSCOPY (EGD) WITH PROPOFOL;  Surgeon: Manya Silvas, MD;  Location: [facility] ENDOSCOPY;  Service: Endoscopy;  Laterality: N/A;  . ESOPHAGOGASTRODUODENOSCOPY (EGD) WITH PROPOFOL N/A 07/17/2018   Procedure: ESOPHAGOGASTRODUODENOSCOPY (EGD) WITH PROPOFOL;  Surgeon: Lucilla Lame, MD;  Location: [facility] ENDOSCOPY;  Service: Endoscopy;  Laterality: N/A;  . ESOPHAGOGASTRODUODENOSCOPY (EGD) WITH PROPOFOL N/A 05/21/2020   Procedure: ESOPHAGOGASTRODUODENOSCOPY (EGD) WITH PROPOFOL;  Surgeon: Jonathon Bellows, MD;  Location: [facility] ENDOSCOPY;  Service: Gastroenterology;  Laterality: N/A;  . EYE SURGERY    . GIVENS CAPSULE STUDY N/A 04/17/2013   Procedure: GIVENS CAPSULE STUDY;  Surgeon: Arta Silence, MD;  Location: [facility] ENDOSCOPY;  Service: Endoscopy;  Laterality: N/A;  patient ate breakfast at 7am   . GIVENS CAPSULE STUDY N/A 06/11/2020   Procedure: GIVENS CAPSULE STUDY;  Surgeon: Virgel Manifold, MD;  Location: [facility] ENDOSCOPY;  Service: Endoscopy;  Laterality: N/A;  . HEMORRHOID BANDING  07/27/2016   Dr Nicholes Stairs  . PARTIAL HYSTERECTOMY    . RIGHT AND LEFT HEART CATH Bilateral 09/14/2017   Procedure: RIGHT AND LEFT HEART CATH;  Surgeon: Teodoro Spray, MD;  Location: [place] CV LAB;  Service: Cardiovascular;  Laterality: Bilateral;  . RIGHT/LEFT HEART CATH AND CORONARY ANGIOGRAPHY Right 05/15/2020   Procedure: Right/Left Heart cath;  Surgeon: Teodoro Spray, MD;  Location: [place] CV LAB;  Service: Cardiovascular;  Laterality: Right;  . TRIGGER FINGER RELEASE      (Not in a hospital admission)  Social History   Socioeconomic History  . Marital status: Married    Spouse name: [name]  . Number of children: Not on file  . Years of education: Not on file  . Highest education level: Not on file  Occupational History  . Occupation: retired   Tobacco Use  . Smoking status: Former Smoker    Quit date: 02/15/1985    Years since quitting: 35.3  .  Smokeless tobacco: Never Used  . Tobacco comment: quit about 31 years ago  Vaping Use  . Vaping Use: Never used  Substance and Sexual Activity  . Alcohol use: No    Alcohol/week: 0.0 standard drinks  . Drug use: No  . Sexual activity: Not on file  Other Topics Concern  . Not on file  Social History Narrative   Lives at home independently.   Social Determinants of Health   Financial Resource Strain:   . Difficulty of Paying Living Expenses:   Food Insecurity: No Food Insecurity  . Worried About Charity fundraiser in the Last Year: Never true  . Ran Out of Food in the Last Year: Never true  Transportation Needs: No Transportation Needs  . Lack of Transportation (Medical): No  . Lack of Transportation (Non-Medical): No  Physical Activity:   . Days of Exercise per Week:   . Minutes of Exercise per Session:   Stress:   . Feeling of Stress :   Social Connections:   . Frequency of Communication with Friends and Family:   . Frequency of Social Gatherings with Friends and Family:   . Attends Religious Services:   . Active Member of Clubs or Organizations:   . Attends Archivist Meetings:   .  Marital Status:   Intimate Partner Violence:   . Fear of Current or Ex-Partner:   . Emotionally Abused:   Marland Kitchen Physically Abused:   . Sexually Abused:     Family History  Problem Relation Age of Onset  . Uterine cancer Mother   . Breast cancer Mother 32  . Seizures Father   . Stroke Father   . Diabetes Father   . COPD Father   . Colon cancer Neg Hx       Review of systems complete and found to be negative unless listed above      PHYSICAL EXAM  General: Well developed, well nourished, in no acute distress HEENT:  Normocephalic and atramatic Neck:  No JVD.  Lungs: Clear bilaterally to auscultation and percussion. Heart: HRRR . Normal S1 and S2 without gallops or murmurs.  Abdomen: Bowel sounds are positive, abdomen soft and non-tender  Msk:  Back normal, normal  gait. Normal strength and tone for age. Extremities: No clubbing, cyanosis or edema.   Neuro: Alert and oriented X 3. Psych:  Good affect, responds appropriately  Labs:   Lab Results  Component Value Date   WBC 5.2 06/13/2020   HGB 7.8 (L) 06/13/2020   HCT 25.4 (L) 06/13/2020   MCV 85.8 06/13/2020   PLT 287 06/13/2020    Recent Labs  Lab 06/12/20 1348 06/12/20 1348 06/13/20 0451  NA 140   < > 140  K 4.2   < > 4.0  CL 107   < > 109  CO2 25   < > 26  BUN 19   < > 16  CREATININE 0.83   < > 0.80  CALCIUM 9.0   < > 8.3*  PROT 6.9  --   --   BILITOT 0.6  --   --   ALKPHOS 71  --   --   ALT 18  --   --   AST 21  --   --   GLUCOSE 162*   < > 94   < > = values in this interval not displayed.   Lab Results  Component Value Date   CKTOTAL 101 01/10/2013   CKMB 0.6 01/10/2013   TROPONINI <0.03 01/27/2019    Lab Results  Component Value Date   CHOL 130 05/21/2020   CHOL 188 09/29/2015   Lab Results  Component Value Date   HDL 35 (L) 05/21/2020   HDL 41 09/29/2015   Lab Results  Component Value Date   LDLCALC 79 05/21/2020   LDLCALC 131 (H) 09/29/2015   Lab Results  Component Value Date   TRIG 81 05/21/2020   TRIG 81 09/29/2015   Lab Results  Component Value Date   CHOLHDL 3.7 05/21/2020   CHOLHDL 4.6 09/29/2015   No results found for: LDLDIRECT    Radiology: CARDIAC CATHETERIZATION  Result Date: 05/15/2020  Ost 1st Diag lesion is 60% stenosed.  1st Diag-1 lesion is 60% stenosed.  1st Diag-2 lesion is 75% stenosed.  Prox LAD lesion is 55% stenosed.  Prox RCA lesion is 95% stenosed.  Previously placed Prox Cx to Mid Cx stent (unknown type) is widely patent.  Prox Cx lesion is 50% stenosed.  The left ventricular systolic function is normal.  The left ventricular ejection fraction is 55-65% by visual estimate.  There is trivial (1+) mitral regurgitation.  There is moderate aortic valve stenosis.  LV end diastolic pressure is normal.  LM-normal  LAD-insignificant disease LCx-patent stent in lcx RCA-95% proximal stenosis Moderate  as PCI of rca Medical management of as   CARDIAC CATHETERIZATION  Result Date: 05/15/2020  Prox RCA-1 lesion is 60% stenosed.  Prox RCA-2 lesion is 95% stenosed.  A drug-eluting stent was successfully placed using a STENT RESOLUTE ONYX 2.5X26.  Post intervention, there is a 0% residual stenosis.  Post intervention, there is a 0% residual stenosis.  1.  One-vessel coronary artery disease with high-grade stenosis proximal/mid RCA 2.  Successful PCI with DES proximal/mid RCA    EKG:   ASSESSMENT AND PLAN:   1.  CAD, status post drug-eluting stent proximal RCA 05/15/2020, aspirin and Plavix held due to GI bleed, no chest pain 2.  GI bleed, with melena, positive capsule study revealing small bowel bleeding awaiting further GI evaluation and management, status post transfusion 1 unit RBCs  Recommendations  1.  Agree with overall current therapy 2.  Resume aspirin and Plavix as soon as possible after stabilization of GI bleed 3.  No further cardiac diagnostics at this time  Signed: Isaias Cowman MD,PhD, [facility] 06/13/2020, 8:31 AM

## 2020-06-13 NOTE — ED Notes (Addendum)
Pt states she went to the bathroom and just urinated. RN was unable to measure it, as RN was not the person who assisted pt to the bathroom. Pt states no BM, but does have some abdominal cramping. Provider notified of abdominal cramping.

## 2020-06-13 NOTE — ED Notes (Signed)
Randol Kern, NP notified via secure chat of the down trending hemoglobin, with the most recent being 7.5

## 2020-06-13 NOTE — ED Notes (Signed)
Pt ambulatory to bedside toilet independently and without distress. PT needed a short period of time to sit on edge of bed due to feeling "swimmy headed" but reports it subsided quickly.

## 2020-06-13 NOTE — Op Note (Signed)
[facility] Gastroenterology Patient Name: Joyce Potter Procedure Date: 06/13/2020 1:01 PM MRN: [id number] Account #: [id number] Date of Birth: 15-Aug-1946 Admit Type: Inpatient Age: 74 Room: [facility] ENDO ROOM 4 Gender: Female Note Status: Finalized Procedure:             Upper GI endoscopy Indications:           Suspected upper gastrointestinal bleeding Providers:             Lucilla Lame MD, MD Referring MD:          [name], MD (Referring MD) Medicines:             Propofol per Anesthesia Complications:         No immediate complications. Procedure:             Pre-Anesthesia Assessment:                        - Prior to the procedure, a History and Physical was                         performed, and patient medications and allergies were                         reviewed. The patient's tolerance of previous                         anesthesia was also reviewed. The risks and benefits                         of the procedure and the sedation options and risks                         were discussed with the patient. All questions were                         answered, and informed consent was obtained. Prior                         Anticoagulants: The patient has taken no previous                         anticoagulant or antiplatelet agents. ASA Grade                         Assessment: II - A patient with mild systemic disease.                         After reviewing the risks and benefits, the patient                         was deemed in satisfactory condition to undergo the                         procedure.                        After obtaining informed consent, the endoscope was  passed under direct vision. Throughout the procedure,                         the patient's blood pressure, pulse, and oxygen                         saturations were monitored continuously. The                         Enteroscope was introduced through  the mouth, and                         advanced to the jejunum. The upper GI endoscopy was                         accomplished without difficulty. The patient tolerated                         the procedure well. Findings:      The examined esophagus was normal.      The entire examined stomach was normal.      The examined duodenum was normal.      Three 1 mm angiodysplastic lesions without bleeding were found in the       jejunum. Coagulation for tissue destruction using argon plasma at 2       liters/minute and 30 watts was successful.      No active bleeding seen. Impression:            - Normal esophagus.                        - Normal stomach.                        - Normal examined duodenum.                        - Three non-bleeding angiodysplastic lesions in the                         jejunum. Treated with argon plasma coagulation (APC).                        - No active bleeding seen.                        - No specimens collected. Recommendation:        - Return patient to hospital ward for ongoing care.                        - Resume previous diet.                        - Continue present medications. Procedure Code(s):     --- Professional ---                        [phone], Esophagogastroduodenoscopy, flexible,                         transoral; with ablation of tumor(s), polyp(s), or  other lesion(s) (includes pre- and post-dilation and                         guide wire passage, when performed) Diagnosis Code(s):     --- Professional ---                        K55.20, Angiodysplasia of colon without hemorrhage CPT copyright 2019 American Medical Association. All rights reserved. The codes documented in this report are preliminary and upon coder review may  be revised to meet current compliance requirements. Lucilla Lame MD, MD 06/13/2020 1:53:05 PM This report has been signed electronically. Number of Addenda: 0 Note Initiated On:  06/13/2020 1:01 PM Estimated Blood Loss:  Estimated blood loss: none.      [facility]

## 2020-06-13 NOTE — Progress Notes (Signed)
PROGRESS NOTE    LORRETTA KERCE  EUM:[id number] DOB: Jun 16, 1946 DOA: 06/12/2020 PCP: [name], MD   Assessment & Plan:   Active Problems:   GI bleeding   GI bleeding: secondary to angiodysplastic lesion. Will hold aspirin & plavix. S/p 1 unit of pRBCs transfused. Continue on PPI. EGD showed 3 angiodysplastic lesions in the jejunum that was treated w/ argon plasma coagulation 06/13/20 as per GI   CAD: s/p PCI and stent. Continue to hold aspirin & plavix  DM2: continue on SSI w/ accuchecks  Peripheral neuropathy: continue on home dose of gabapentin  Hypertension: continue on home dose of metoprolol  Dyslipidemia: continue on statin    DVT prophylaxis: SCDs Code Status: full  Family Communication:  Disposition Plan: depends on PT/OT recs ( not consulted yet)   Consultants:   GI   Procedures:   Antimicrobials:    Subjective: Pt c/o weakness  Objective: Vitals:   06/13/20 0452 06/13/20 0500 06/13/20 0613 06/13/20 0657  BP:  (!) 152/73 (!) 144/58 (!) 137/50  Pulse: 73 69 67 79  Resp: 18  16 16   Temp: 97.8 F (36.6 C)     TempSrc: Oral     SpO2: 99% 100% 100% 100%  Weight:      Height:        Intake/Output Summary (Last 24 hours) at 06/13/2020 0747 Last data filed at 06/12/2020 2210 Gross per 24 hour  Intake 720 ml  Output --  Net 720 ml   Filed Weights   06/12/20 1335  Weight: 88.9 kg    Examination:  General exam: Appears calm and comfortable  Respiratory system: Clear to auscultation. Respiratory effort normal. Cardiovascular system: S1 & S2+. No  rubs, gallops or clicks. No pedal edema. Gastrointestinal system: Abdomen is nondistended, soft and nontender. Hyperactive bowel sounds heard. Central nervous system: Alert and oriented. Moves all 4 extremities Psychiatry: Judgement and insight appear normal. Mood & affect appropriate.     Data Reviewed: I have personally reviewed following labs and imaging studies  CBC: Recent Labs   Lab 06/09/20 1018 06/12/20 1348 06/12/20 2300 06/13/20 0206 06/13/20 0451  WBC 4.2 5.2  --   --  5.2  NEUTROABS 2.7  --   --   --   --   HGB 7.5* 6.8* 8.0* 7.5* 7.8*  HCT 24.0* 22.6* 25.6* 24.6* 25.4*  MCV 81.9 86.6  --   --  85.8  PLT 266 316  --   --  540   Basic Metabolic Panel: Recent Labs  Lab 06/12/20 1348 06/13/20 0451  NA 140 140  K 4.2 4.0  CL 107 109  CO2 25 26  GLUCOSE 162* 94  BUN 19 16  CREATININE 0.83 0.80  CALCIUM 9.0 8.3*   GFR: Estimated Creatinine Clearance: 67.2 mL/min (by C-G formula based on SCr of 0.8 mg/dL). Liver Function Tests: Recent Labs  Lab 06/12/20 1348  AST 21  ALT 18  ALKPHOS 71  BILITOT 0.6  PROT 6.9  ALBUMIN 3.7   No results for input(s): LIPASE, AMYLASE in the last 168 hours. No results for input(s): AMMONIA in the last 168 hours. Coagulation Profile: Recent Labs  Lab 06/12/20 1348  INR 1.0   Cardiac Enzymes: No results for input(s): CKTOTAL, CKMB, CKMBINDEX, TROPONINI in the last 168 hours. BNP (last 3 results) No results for input(s): PROBNP in the last 8760 hours. HbA1C: No results for input(s): HGBA1C in the last 72 hours. CBG: Recent Labs  Lab 06/12/20 2305  GLUCAP 136*   Lipid Profile: No results for input(s): CHOL, HDL, LDLCALC, TRIG, CHOLHDL, LDLDIRECT in the last 72 hours. Thyroid Function Tests: No results for input(s): TSH, T4TOTAL, FREET4, T3FREE, THYROIDAB in the last 72 hours. Anemia Panel: No results for input(s): VITAMINB12, FOLATE, FERRITIN, TIBC, IRON, RETICCTPCT in the last 72 hours. Sepsis Labs: No results for input(s): PROCALCITON, LATICACIDVEN in the last 168 hours.  Recent Results (from the past 240 hour(s))  SARS Coronavirus 2 by RT PCR (hospital order, performed in [facility] hospital lab) Nasopharyngeal Nasopharyngeal Swab     Status: None   Collection Time: 06/12/20  7:47 PM   Specimen: Nasopharyngeal Swab  Result Value Ref Range Status   SARS Coronavirus 2 NEGATIVE NEGATIVE  Final    Comment: (NOTE) SARS-CoV-2 target nucleic acids are NOT DETECTED.  The SARS-CoV-2 RNA is generally detectable in upper and lower respiratory specimens during the acute phase of infection. The lowest concentration of SARS-CoV-2 viral copies this assay can detect is 250 copies / mL. A negative result does not preclude SARS-CoV-2 infection and should not be used as the sole basis for treatment or other patient management decisions.  A negative result may occur with improper specimen collection / handling, submission of specimen other than nasopharyngeal swab, presence of viral mutation(s) within the areas targeted by this assay, and inadequate number of viral copies (<250 copies / mL). A negative result must be combined with clinical observations, patient history, and epidemiological information.  Fact Sheet for Patients:   StrictlyIdeas.no  Fact Sheet for Healthcare Providers: BankingDealers.co.za  This test is not yet approved or  cleared by the [place] FDA and has been authorized for detection and/or diagnosis of SARS-CoV-2 by FDA under an Emergency Use Authorization (EUA).  This EUA will remain in effect (meaning this test can be used) for the duration of the COVID-19 declaration under Section 564(b)(1) of the Act, 21 U.S.C. section 360bbb-3(b)(1), unless the authorization is terminated or revoked sooner.  Performed at [facility], [address], [place], [place] [address]          Radiology Studies: No results found.      Scheduled Meds:  gabapentin  300 mg Oral BID   insulin aspart  0-9 Units Subcutaneous QID   insulin aspart protamine- aspart  30 Units Subcutaneous BID WC   lidocaine  1 patch Transdermal Q24H   lisinopril  10 mg Oral Daily   metoprolol tartrate  100 mg Oral TID   pantoprazole (PROTONIX) IV  40 mg Intravenous Q12H   pravastatin  20 mg Oral QHS   tiotropium  18  mcg Inhalation q morning - 10a   Continuous Infusions:  sodium chloride 100 mL/hr at 06/12/20 2303     LOS: 1 day    Time spent: 32 mins     [name], MD [facility] Pager [phone]  If 7PM-7AM, please contact night-coverage [email or web address] 06/13/2020, 7:47 AM

## 2020-06-14 LAB — BASIC METABOLIC PANEL
Anion gap: 6 (ref 5–15)
BUN: 10 mg/dL (ref 8–23)
CO2: 23 mmol/L (ref 22–32)
Calcium: 8 mg/dL — ABNORMAL LOW (ref 8.9–10.3)
Chloride: 109 mmol/L (ref 98–111)
Creatinine, Ser: 0.73 mg/dL (ref 0.44–1.00)
GFR calc Af Amer: >60 mL/min (ref >60)
GFR calc non Af Amer: >60 mL/min (ref >60)
Glucose, Bld: 157 mg/dL — ABNORMAL HIGH (ref 70–99)
Potassium: 3.7 mmol/L (ref 3.5–5.1)
Sodium: 138 mmol/L (ref 135–145)

## 2020-06-14 LAB — GLUCOSE, CAPILLARY
Glucose-Capillary: 104 mg/dL — ABNORMAL HIGH (ref 70–99)
Glucose-Capillary: 142 mg/dL — ABNORMAL HIGH (ref 70–99)
Glucose-Capillary: 119 mg/dL — ABNORMAL HIGH (ref 70–99)
Glucose-Capillary: 152 mg/dL — ABNORMAL HIGH (ref 70–99)

## 2020-06-14 LAB — CBC
HCT: 23.6 % — ABNORMAL LOW (ref 36.0–46.0)
Hemoglobin: 7.7 g/dL — ABNORMAL LOW (ref 12.0–15.0)
MCH: 26.9 pg (ref 26.0–34.0)
MCHC: 32.6 g/dL (ref 30.0–36.0)
MCV: 82.5 fL (ref 80.0–100.0)
Platelets: 247 10*3/uL (ref 150–400)
RBC: 2.86 MIL/uL — ABNORMAL LOW (ref 3.87–5.11)
RDW: 16.9 % — ABNORMAL HIGH (ref 11.5–15.5)
WBC: 7.2 10*3/uL (ref 4.0–10.5)
nRBC: 0 % (ref 0.0–0.2)

## 2020-06-14 LAB — OCCULT BLOOD X 1 CARD TO LAB, STOOL: Fecal Occult Bld: POSITIVE — AB

## 2020-06-14 MED ORDER — SIMETHICONE 80 MG PO CHEW
80.0000 mg | CHEWABLE_TABLET | Freq: Four times a day (QID) | ORAL | Status: DC | PRN
  Filled 2020-06-14: qty 1

## 2020-06-14 MED ORDER — ASPIRIN EC 81 MG PO TBEC
81.0000 mg | DELAYED_RELEASE_TABLET | Freq: Every day | ORAL | Status: DC
  Administered 2020-06-14 – 2020-06-15 (×2): 81 mg via ORAL
  Filled 2020-06-14 (×2): qty 1

## 2020-06-14 NOTE — Plan of Care (Signed)
Pt denies n/v/d, SOB, abdominal pain and chest discomfort.  H/H level decreased from yesterday.  Plan is to repeat lab work in AM.  If H/H is stable, then possible d/c home.   Problem: Education: Goal: Knowledge of General Education information will improve Description: Including pain rating scale, medication(s)/side effects and non-pharmacologic comfort measures Outcome: Progressing   Problem: Health Behavior/Discharge Planning: Goal: Ability to manage health-related needs will improve Outcome: Progressing   Problem: Clinical Measurements: Goal: Ability to maintain clinical measurements within normal limits will improve Outcome: Progressing Goal: Will remain free from infection Outcome: Progressing Goal: Diagnostic test results will improve Outcome: Progressing Goal: Respiratory complications will improve Outcome: Progressing Goal: Cardiovascular complication will be avoided Outcome: Progressing   Problem: Activity: Goal: Risk for activity intolerance will decrease Outcome: Progressing   Problem: Nutrition: Goal: Adequate nutrition will be maintained Outcome: Progressing   Problem: Coping: Goal: Level of anxiety will decrease Outcome: Progressing   Problem: Elimination: Goal: Will not experience complications related to bowel motility Outcome: Progressing Goal: Will not experience complications related to urinary retention Outcome: Progressing   Problem: Pain Managment: Goal: General experience of comfort will improve Outcome: Progressing   Problem: Safety: Goal: Ability to remain free from injury will improve Outcome: Progressing   Problem: Skin Integrity: Goal: Risk for impaired skin integrity will decrease Outcome: Progressing   Problem: Education: Goal: Ability to identify signs and symptoms of gastrointestinal bleeding will improve Outcome: Progressing   Problem: Bowel/Gastric: Goal: Will show no signs and symptoms of gastrointestinal  bleeding Outcome: Progressing   Problem: Fluid Volume: Goal: Will show no signs and symptoms of excessive bleeding Outcome: Progressing   Problem: Clinical Measurements: Goal: Complications related to the disease process, condition or treatment will be avoided or minimized Outcome: Progressing

## 2020-06-14 NOTE — Progress Notes (Signed)
PROGRESS NOTE    Joyce Potter  JKD:[id number] DOB: 07/20/1946 DOA: 06/12/2020 PCP: [name], MD   Assessment & Plan:   Active Problems:   GI bleeding   GI bleeding: secondary to angiodysplastic lesion. Will restart aspirin as per cardio, GI. Hold plavix. S/p 1 unit of pRBCs transfused. Continue on PPI. EGD showed 3 angiodysplastic lesions in the jejunum that was treated w/ argon plasma coagulation 06/13/20 as per GI   Acute blood loss anemia: secondary to above. S/p 1 unit of pRBCs transfused. H&H trending down today slightly. Will continue to monitor   CAD: s/p PCI and stent. Continue on aspirin & hold plavix  DM2: continue on SSI w/ accuchecks  Peripheral neuropathy: continue on home dose of gabapentin  Hypertension: continue on home dose of metoprolol  Dyslipidemia: continue on statin    DVT prophylaxis: SCDs Code Status: full  Family Communication:  Disposition Plan: depends on PT recs    Consultants:   GI   Procedures:   Antimicrobials:    Subjective: Pt c/o weakness  Objective: Vitals:   06/13/20 2207 06/14/20 0503 06/14/20 0640 06/14/20 0736  BP: (!) 151/79 (!) 112/54 139/81 (!) 115/53  Pulse: 81 69 91 71  Resp:  18 20   Temp:  97.7 F (36.5 C) (!) 97.5 F (36.4 C) 99.2 F (37.3 C)  TempSrc:  Oral Oral Oral  SpO2:  92% 100% 97%  Weight:  90.8 kg    Height:        Intake/Output Summary (Last 24 hours) at 06/14/2020 0817 Last data filed at 06/14/2020 0700 Gross per 24 hour  Intake 2140 ml  Output --  Net 2140 ml   Filed Weights   06/13/20 1300 06/13/20 1451 06/14/20 0503  Weight: 88 kg 89.7 kg 90.8 kg    Examination:  General exam: Appears calm and comfortable  Respiratory system: Clear to auscultation. No wheezes, rales Cardiovascular system: S1 & S2+. No  rubs, gallops or clicks.  Gastrointestinal system: Abdomen is nondistended, soft and nontender. normal bowel sounds heard. Central nervous system: Alert and oriented.  Moves all 4 extremities Psychiatry: Judgement and insight appear normal. Mood & affect appropriate.     Data Reviewed: I have personally reviewed following labs and imaging studies  CBC: Recent Labs  Lab 06/09/20 1018 06/09/20 1018 06/12/20 1348 06/12/20 1348 06/12/20 2300 06/13/20 0206 06/13/20 0451 06/13/20 1131 06/13/20 1513  WBC 4.2  --  5.2  --   --   --  5.2  --   --   NEUTROABS 2.7  --   --   --   --   --   --   --   --   HGB 7.5*   < > 6.8*   < > 8.0* 7.5* 7.8* 7.9* 8.9*  HCT 24.0*   < > 22.6*   < > 25.6* 24.6* 25.4* 24.1* 27.6*  MCV 81.9  --  86.6  --   --   --  85.8  --   --   PLT 266  --  316  --   --   --  287  --   --    < > = values in this interval not displayed.   Basic Metabolic Panel: Recent Labs  Lab 06/12/20 1348 06/13/20 0451  NA 140 140  K 4.2 4.0  CL 107 109  CO2 25 26  GLUCOSE 162* 94  BUN 19 16  CREATININE 0.83 0.80  CALCIUM 9.0 8.3*  GFR: Estimated Creatinine Clearance: 68 mL/min (by C-G formula based on SCr of 0.8 mg/dL). Liver Function Tests: Recent Labs  Lab 06/12/20 1348  AST 21  ALT 18  ALKPHOS 71  BILITOT 0.6  PROT 6.9  ALBUMIN 3.7   No results for input(s): LIPASE, AMYLASE in the last 168 hours. No results for input(s): AMMONIA in the last 168 hours. Coagulation Profile: Recent Labs  Lab 06/12/20 1348  INR 1.0   Cardiac Enzymes: No results for input(s): CKTOTAL, CKMB, CKMBINDEX, TROPONINI in the last 168 hours. BNP (last 3 results) No results for input(s): PROBNP in the last 8760 hours. HbA1C: No results for input(s): HGBA1C in the last 72 hours. CBG: Recent Labs  Lab 06/12/20 2305 06/13/20 0840 06/13/20 1633 06/13/20 2204 06/14/20 0737  GLUCAP 136* 69* 86 110* 104*   Lipid Profile: No results for input(s): CHOL, HDL, LDLCALC, TRIG, CHOLHDL, LDLDIRECT in the last 72 hours. Thyroid Function Tests: No results for input(s): TSH, T4TOTAL, FREET4, T3FREE, THYROIDAB in the last 72 hours. Anemia Panel: No  results for input(s): VITAMINB12, FOLATE, FERRITIN, TIBC, IRON, RETICCTPCT in the last 72 hours. Sepsis Labs: No results for input(s): PROCALCITON, LATICACIDVEN in the last 168 hours.  Recent Results (from the past 240 hour(s))  SARS Coronavirus 2 by RT PCR (hospital order, performed in [facility] hospital lab) Nasopharyngeal Nasopharyngeal Swab     Status: None   Collection Time: 06/12/20  7:47 PM   Specimen: Nasopharyngeal Swab  Result Value Ref Range Status   SARS Coronavirus 2 NEGATIVE NEGATIVE Final    Comment: (NOTE) SARS-CoV-2 target nucleic acids are NOT DETECTED.  The SARS-CoV-2 RNA is generally detectable in upper and lower respiratory specimens during the acute phase of infection. The lowest concentration of SARS-CoV-2 viral copies this assay can detect is 250 copies / mL. A negative result does not preclude SARS-CoV-2 infection and should not be used as the sole basis for treatment or other patient management decisions.  A negative result may occur with improper specimen collection / handling, submission of specimen other than nasopharyngeal swab, presence of viral mutation(s) within the areas targeted by this assay, and inadequate number of viral copies (<250 copies / mL). A negative result must be combined with clinical observations, patient history, and epidemiological information.  Fact Sheet for Patients:   StrictlyIdeas.no  Fact Sheet for Healthcare Providers: BankingDealers.co.za  This test is not yet approved or  cleared by the [place] FDA and has been authorized for detection and/or diagnosis of SARS-CoV-2 by FDA under an Emergency Use Authorization (EUA).  This EUA will remain in effect (meaning this test can be used) for the duration of the COVID-19 declaration under Section 564(b)(1) of the Act, 21 U.S.C. section 360bbb-3(b)(1), unless the authorization is terminated or revoked sooner.  Performed at  [facility], [address], [place], [place] [address]          Radiology Studies: No results found.      Scheduled Meds: . gabapentin  300 mg Oral BID  . insulin aspart  0-9 Units Subcutaneous QID  . lidocaine  1 patch Transdermal Q24H  . lisinopril  10 mg Oral Daily  . metoprolol tartrate  100 mg Oral TID  . mupirocin ointment  1 application Nasal BID  . pantoprazole (PROTONIX) IV  40 mg Intravenous Q12H  . pravastatin  20 mg Oral QHS  . saccharomyces boulardii  250 mg Oral BID  . tiotropium  18 mcg Inhalation q morning - 10a  Continuous Infusions: . sodium chloride Stopped (06/13/20 1500)     LOS: 2 days    Time spent: 32 mins     [name], MD [facility] Pager [phone]  If 7PM-7AM, please contact night-coverage [email or web address] 06/14/2020, 8:17 AM

## 2020-06-14 NOTE — Progress Notes (Signed)
GI Inpatient Follow-up Note  Subjective:  Patient seen in follow-up for obscure GI bleeding. She is s/p push enteroscopy by Dr. Allen Norris yesterday which showed three non-bleeding AVMs in the duodenum s/p treatment with APC. No active bleeding seen during the procedure. She denies any acute events overnight. She reports one dark BM yesterday, but no bowel movements today so far. She denies any overt hematochezia. Hemoglobin 7.7 this morning.   Scheduled Inpatient Medications:  . aspirin EC  81 mg Oral Daily  . gabapentin  300 mg Oral BID  . insulin aspart  0-9 Units Subcutaneous QID  . lidocaine  1 patch Transdermal Q24H  . lisinopril  10 mg Oral Daily  . metoprolol tartrate  100 mg Oral TID  . pantoprazole (PROTONIX) IV  40 mg Intravenous Q12H  . pravastatin  20 mg Oral QHS  . saccharomyces boulardii  250 mg Oral BID  . tiotropium  18 mcg Inhalation q morning - 10a    Continuous Inpatient Infusions:   . sodium chloride 100 mL/hr at 06/14/20 1243    PRN Inpatient Medications:  acetaminophen **OR** acetaminophen, albuterol, magnesium hydroxide, menthol-cetylpyridinium, ondansetron **OR** ondansetron (ZOFRAN) IV, simethicone, traMADol, traZODone  Review of Systems: Constitutional: Weight is stable.  Eyes: No changes in vision. ENT: No oral lesions, sore throat.  GI: see HPI.  Heme/Lymph: No easy bruising.  CV: No chest pain.  GU: No hematuria.  Integumentary: No rashes.  Neuro: No headaches.  Psych: No depression/anxiety.  Endocrine: No heat/cold intolerance.  Allergic/Immunologic: No urticaria.  Resp: No cough, SOB.  Musculoskeletal: No joint swelling.    Physical Examination: BP (!) 113/49 (BP Location: Right Arm)   Pulse 69   Temp 98.7 F (37.1 C) (Oral)   Resp 20   Ht 5\' 3"  (1.6 m)   Wt 90.8 kg   SpO2 96%   BMI 35.46 kg/m  Gen: NAD, alert and oriented x 4 HEENT: PEERLA, EOMI, Neck: supple, no JVD or thyromegaly Chest: CTA bilaterally, no wheezes, crackles, or  other adventitious sounds CV: RRR, no m/g/c/r Abd: soft, NT, ND, +BS in all four quadrants; no HSM, guarding, ridigity, or rebound tenderness Ext: no edema, well perfused with 2+ pulses, Skin: no rash or lesions noted Lymph: no LAD  Data: Lab Results  Component Value Date   WBC 7.2 06/14/2020   HGB 7.7 (L) 06/14/2020   HCT 23.6 (L) 06/14/2020   MCV 82.5 06/14/2020   PLT 247 06/14/2020   Recent Labs  Lab 06/13/20 1131 06/13/20 1513 06/14/20 1007  HGB 7.9* 8.9* 7.7*   Lab Results  Component Value Date   NA 138 06/14/2020   K 3.7 06/14/2020   CL 109 06/14/2020   CO2 23 06/14/2020   BUN 10 06/14/2020   CREATININE 0.73 06/14/2020   Lab Results  Component Value Date   ALT 18 06/12/2020   AST 21 06/12/2020   ALKPHOS 71 06/12/2020   BILITOT 0.6 06/12/2020   Recent Labs  Lab 06/12/20 1348  INR 1.0   Assessment/Plan:  74 y/o Caucasian female with a PMH of CAD s/p recent PCI and stenting last month, T2DM with peripheral neuropathy, HTN, and HLD admitted for obscure GIB  1. Gastrointestinal bleeding 2/2 jejunal AVMs s/p APC 06/13/20  2. CAD s/p PCI and stenting 05/15/20  Recommendations:  -We reviewed push enteroscopy procedure report from yesterday -No signs of overt gastrointestinal blood loss -Continue to monitor H&H and transfuse for Hgb <7.0. -Continue acid suppression therapy -Dr. Allen Norris and Dr. Saralyn Pilar  discussed blood thinners and it was decided to restart ASA 81 mg daily today and restart Plavix on discharge -Continue heart healthy diet -PT consult pending -If remains hemodynamically stable with no recurrent GIB, plan on discharge tomorrow  Please call with questions or concerns.    Octavia Bruckner, PA-C [facility] [phone] (Cell)

## 2020-06-14 NOTE — Progress Notes (Signed)
Discussed with Dr. Allen Norris, reasonable to restart low-dose aspirin at this time.  We will continue to follow closely for recurrent GI bleed and if patient remains stable, will restart clopidogrel as outpatient.

## 2020-06-14 NOTE — Progress Notes (Signed)
[facility]  SUBJECTIVE: Patient laying in bed, denies chest pain or shortness of breath   Vitals:   06/13/20 2207 06/14/20 0503 06/14/20 0640 06/14/20 0736  BP: (!) 151/79 (!) 112/54 139/81 (!) 115/53  Pulse: 81 69 91 71  Resp:  18 20   Temp:  97.7 F (36.5 C) (!) 97.5 F (36.4 C) 99.2 F (37.3 C)  TempSrc:  Oral Oral Oral  SpO2:  92% 100% 97%  Weight:  90.8 kg    Height:         Intake/Output Summary (Last 24 hours) at 06/14/2020 0901 Last data filed at 06/14/2020 0700 Gross per 24 hour  Intake 2140 ml  Output --  Net 2140 ml      PHYSICAL EXAM  General: Well developed, well nourished, in no acute distress HEENT:  Normocephalic and atramatic Neck:  No JVD.  Lungs: Clear bilaterally to auscultation and percussion. Heart: HRRR . Normal S1 and S2 without gallops or murmurs.  Abdomen: Bowel sounds are positive, abdomen soft and non-tender  Msk:  Back normal, normal gait. Normal strength and tone for age. Extremities: No clubbing, cyanosis or edema.   Neuro: Alert and oriented X 3. Psych:  Good affect, responds appropriately   LABS: Basic Metabolic Panel: Recent Labs    06/12/20 1348 06/13/20 0451  NA 140 140  K 4.2 4.0  CL 107 109  CO2 25 26  GLUCOSE 162* 94  BUN 19 16  CREATININE 0.83 0.80  CALCIUM 9.0 8.3*   Liver Function Tests: Recent Labs    06/12/20 1348  AST 21  ALT 18  ALKPHOS 71  BILITOT 0.6  PROT 6.9  ALBUMIN 3.7   No results for input(s): LIPASE, AMYLASE in the last 72 hours. CBC: Recent Labs    06/12/20 1348 06/12/20 2300 06/13/20 0451 06/13/20 0451 06/13/20 1131 06/13/20 1513  WBC 5.2  --  5.2  --   --   --   HGB 6.8*   < > 7.8*   < > 7.9* 8.9*  HCT 22.6*   < > 25.4*   < > 24.1* 27.6*  MCV 86.6  --  85.8  --   --   --   PLT 316  --  287  --   --   --    < > = values in this interval not displayed.   Cardiac Enzymes: No results for input(s): CKTOTAL, CKMB, CKMBINDEX, TROPONINI in the last 72 hours. BNP: Invalid  input(s): POCBNP D-Dimer: No results for input(s): DDIMER in the last 72 hours. Hemoglobin A1C: No results for input(s): HGBA1C in the last 72 hours. Fasting Lipid Panel: No results for input(s): CHOL, HDL, LDLCALC, TRIG, CHOLHDL, LDLDIRECT in the last 72 hours. Thyroid Function Tests: No results for input(s): TSH, T4TOTAL, T3FREE, THYROIDAB in the last 72 hours.  Invalid input(s): FREET3 Anemia Panel: No results for input(s): VITAMINB12, FOLATE, FERRITIN, TIBC, IRON, RETICCTPCT in the last 72 hours.  No results found.   Echo   TELEMETRY: Sinus rhythm:  ASSESSMENT AND PLAN:  Active Problems:   GI bleeding    1.  CAD, status post drug-eluting stent proximal RCA 05/15/2020, aspirin and Plavix held 2 GI bleed, no chest pain 2.  GI bleed, with melena, and diffuse 1 unit RBCs, underwent EGD with ablation of bleeding dysplastic lesions in jejunum, hemoglobin and hematocrit stable  Recommendations  1.  Agree with current therapy 2.  Resume aspirin and Plavix if okay per GI 3.  No further  cardiac diagnostics at this time   Isaias Cowman, MD, PhD, [facility] 06/14/2020 9:01 AM

## 2020-06-15 DIAGNOSIS — I25119 Atherosclerotic heart disease of native coronary artery with unspecified angina pectoris: Secondary | ICD-10-CM

## 2020-06-15 LAB — CBC
HCT: 26 % — ABNORMAL LOW (ref 36.0–46.0)
Hemoglobin: 7.9 g/dL — ABNORMAL LOW (ref 12.0–15.0)
MCH: 26.5 pg (ref 26.0–34.0)
MCHC: 30.4 g/dL (ref 30.0–36.0)
MCV: 87.2 fL (ref 80.0–100.0)
Platelets: 253 10*3/uL (ref 150–400)
RBC: 2.98 MIL/uL — ABNORMAL LOW (ref 3.87–5.11)
RDW: 16.9 % — ABNORMAL HIGH (ref 11.5–15.5)
WBC: 4.2 10*3/uL (ref 4.0–10.5)
nRBC: 0 % (ref 0.0–0.2)

## 2020-06-15 LAB — BASIC METABOLIC PANEL
Anion gap: 8 (ref 5–15)
BUN: 10 mg/dL (ref 8–23)
CO2: 22 mmol/L (ref 22–32)
Calcium: 8.4 mg/dL — ABNORMAL LOW (ref 8.9–10.3)
Chloride: 109 mmol/L (ref 98–111)
Creatinine, Ser: 0.86 mg/dL (ref 0.44–1.00)
GFR calc Af Amer: >60 mL/min (ref >60)
GFR calc non Af Amer: >60 mL/min (ref >60)
Glucose, Bld: 125 mg/dL — ABNORMAL HIGH (ref 70–99)
Potassium: 3.9 mmol/L (ref 3.5–5.1)
Sodium: 139 mmol/L (ref 135–145)

## 2020-06-15 LAB — GLUCOSE, CAPILLARY
Glucose-Capillary: 133 mg/dL — ABNORMAL HIGH (ref 70–99)
Glucose-Capillary: 145 mg/dL — ABNORMAL HIGH (ref 70–99)

## 2020-06-15 MED ORDER — ENSURE MAX PROTEIN PO LIQD: 11.0000 [oz_av] | Freq: Every day | ORAL | Status: DC

## 2020-06-15 MED ORDER — ASPIRIN 81 MG PO TBEC: 81.0000 mg | DELAYED_RELEASE_TABLET | Freq: Every day | ORAL | 11 refills | Status: DC

## 2020-06-15 MED ORDER — INSULIN ASPART 100 UNIT/ML ~~LOC~~ SOLN
0.0000 [IU] | Freq: Three times a day (TID) | SUBCUTANEOUS | Status: DC
  Administered 2020-06-15: 1 [IU] via SUBCUTANEOUS
  Filled 2020-06-15: qty 1

## 2020-06-15 NOTE — Progress Notes (Signed)
Patient prepared for Discharge. Pt's discharge instructions reviewed with patient and son with correct understanding with teach feedback.  All questions answered. Patient will make follow up appointments with her PCP Dr, Humphrey Rolls in one week. Follow up with Dr. Ubaldo Glassing in one to two weeks, and Follow up with GI. Pt D/ from unit at 14:20

## 2020-06-15 NOTE — Plan of Care (Signed)
Nutrition Education Note  Patient requested diet education regarding DM and a heart-healthy diet.  Lipid Panel     Component Value Date/Time   CHOL 130 05/21/2020 1008   TRIG 81 05/21/2020 1008   HDL 35 (L) 05/21/2020 1008   CHOLHDL 3.7 05/21/2020 1008   VLDL 16 05/21/2020 1008   LDLCALC 79 05/21/2020 1008    RD reviewed "Heart Healthy Nutrition Therapy" handout from the Academy of [employer] with patient over the phone. Will mail handout to patient. Reviewed patient's dietary recall. Provided examples on ways to decrease sodium and fat intake in diet. Discouraged intake of processed foods and use of salt shaker. Encouraged fresh fruits and vegetables as well as whole grain sources of carbohydrates to maximize fiber intake. Teach back method used.   Lab Results  Component Value Date   HGBA1C 7.3 (H) 05/21/2020    RD reviewed "Carbohydrate Counting for People with Diabetes" handout from the Academy of [employer]etics with patient over the phone. Will mail handout to patient. Discussed different food groups and their effects on blood sugar, emphasizing carbohydrate-containing foods. Provided list of carbohydrates and recommended serving sizes of common foods.  Discussed importance of controlled and consistent carbohydrate intake throughout the day. Provided examples of ways to balance meals/snacks and encouraged intake of high-fiber, whole grain complex carbohydrates. Teach back method used.  Expect good compliance.  Current diet order is carbohydrate modified, patient is consuming approximately 100% of meals at this time.   Jacklynn Barnacle, MS, RD, LDN Pager number available on Amion

## 2020-06-15 NOTE — Progress Notes (Signed)
Initial Nutrition Assessment  RD working remotely.  DOCUMENTATION CODES:   Obesity unspecified  INTERVENTION:  Provide Ensure Max Protein po once daily, each supplement provides 150 kcal and 30 grams of protein.  Encouraged adequate intake of protein at meals.  Provided diet education for DM and heart-healthy diet per patient request (note to follow). RD will mail handouts to patient.  NUTRITION DIAGNOSIS:   Inadequate oral intake related to decreased appetite (prior to admission) as evidenced by per patient/family report.  GOAL:   Patient will meet greater than or equal to 90% of their needs  MONITOR:   PO intake, Supplement acceptance, Labs, Weight trends, I & O's  REASON FOR ASSESSMENT:   Malnutrition Screening Tool    ASSESSMENT:   74 year old female with PMHx of DM, HTN, CAD s/p PCI and stenting 05/15/2020, PUD, HLD admitted with GIB secondary to jejunal AVMs s/p APC 06/13/20.   Patient reports she had decreased appetite and intake PTA. She reports she was recently started on Trulicity, which she believes was decreasing her appetite. She also recently had a PCI last month and was not eating as well after that procedure. Patient reports her appetite is improving now and she is eating 100% of her meals. She is amenable to drinking Ensure Max once daily to help meet protein needs. Patient is requesting diet education on heart healthy diet and DM.  Patient reports her UBW was around 212 lbs. According to chart patient was 94.4 kg on 03/25/2020. Patient was 89.3 kg on 06/06/2020. That is a weight loss of 5.1 kg (5.4% body weight) over 3 months, which is not significant for time frame. Patient is now up to 91.6 kg today.  Medications reviewed and include: Novolog 0-9 units QID, lisinopril, Protonix, Florastor 250 mg BID, NS at 100 mL/hr.  Labs reviewed: CBG 119-152.  Unable to determine if patient meets criteria for malnutrition at this time.  NUTRITION - FOCUSED PHYSICAL  EXAM:  Unable to complete at this time.  Diet Order:   Diet Order            Diet Heart Room service appropriate? Yes; Fluid consistency: Thin  Diet effective now                 EDUCATION NEEDS:   Education needs have been addressed  Skin:  Skin Assessment: Reviewed RN Assessment  Last BM:  06/14/2020  Height:   Ht Readings from Last 1 Encounters:  06/13/20 5\' 3"  (1.6 m)   Weight:   Wt Readings from Last 1 Encounters:  06/15/20 91.6 kg   BMI:  Body mass index is 35.76 kg/m.  Estimated Nutritional Needs:   Kcal:  2000-2200  Protein:  100-110 grams  Fluid:  2 L/day  Jacklynn Barnacle, MS, RD, LDN Pager number available on Amion

## 2020-06-15 NOTE — Discharge Instructions (Signed)
Anemia  Anemia is a condition in which you do not have enough red blood cells or hemoglobin. Hemoglobin is a substance in red blood cells that carries oxygen. When you do not have enough red blood cells or hemoglobin (are anemic), your body cannot get enough oxygen and your organs may not work properly. As a result, you may feel very tired or have other problems. What are the causes? Common causes of anemia include:  Excessive bleeding. Anemia can be caused by excessive bleeding inside or outside the body, including bleeding from the intestine or from periods in women.  Poor nutrition.  Long-lasting (chronic) kidney, thyroid, and liver disease.  Bone marrow disorders.  Cancer and treatments for cancer.  HIV (human immunodeficiency virus) and AIDS (acquired immunodeficiency syndrome).  Treatments for HIV and AIDS.  Spleen problems.  Blood disorders.  Infections, medicines, and autoimmune disorders that destroy red blood cells. What are the signs or symptoms? Symptoms of this condition include:  Minor weakness.  Dizziness.  Headache.  Feeling heartbeats that are irregular or faster than normal (palpitations).  Shortness of breath, especially with exercise.  Paleness.  Cold sensitivity.  Indigestion.  Nausea.  Difficulty sleeping.  Difficulty concentrating. Symptoms may occur suddenly or develop slowly. If your anemia is mild, you may not have symptoms. How is this diagnosed? This condition is diagnosed based on:  Blood tests.  Your medical history.  A physical exam.  Bone marrow biopsy. Your health care provider may also check your stool (feces) for blood and may do additional testing to look for the cause of your bleeding. You may also have other tests, including:  Imaging tests, such as a CT scan or MRI.  Endoscopy.  Colonoscopy. How is this treated? Treatment for this condition depends on the cause. If you continue to lose a lot of blood, you may  need to be treated at a hospital. Treatment may include:  Taking supplements of iron, vitamin S31, or folic acid.  Taking a hormone medicine (erythropoietin) that can help to stimulate red blood cell growth.  Having a blood transfusion. This may be needed if you lose a lot of blood.  Making changes to your diet.  Having surgery to remove your spleen. Follow these instructions at home:  Take over-the-counter and prescription medicines only as told by your health care provider.  Take supplements only as told by your health care provider.  Follow any diet instructions that you were given.  Keep all follow-up visits as told by your health care provider. This is important. Contact a health care provider if:  You develop new bleeding anywhere in the body. Get help right away if:  You are very weak.  You are short of breath.  You have pain in your abdomen or chest.  You are dizzy or feel faint.  You have trouble concentrating.  You have bloody or black, tarry stools.  You vomit repeatedly or you vomit up blood. Summary  Anemia is a condition in which you do not have enough red blood cells or enough of a substance in your red blood cells that carries oxygen (hemoglobin).  Symptoms may occur suddenly or develop slowly.  If your anemia is mild, you may not have symptoms.  This condition is diagnosed with blood tests as well as a medical history and physical exam. Other tests may be needed.  Treatment for this condition depends on the cause of the anemia. This information is not intended to replace advice given to you by  your health care provider. Make sure you discuss any questions you have with your health care provider. Document Revised: 11/25/2017 Document Reviewed: 01/14/2017 Elsevier Patient Education  Time.

## 2020-06-15 NOTE — Plan of Care (Signed)
  Problem: Education: Goal: Knowledge of General Education information will improve Description: Including pain rating scale, medication(s)/side effects and non-pharmacologic comfort measures Outcome: Completed/Met   Problem: Health Behavior/Discharge Planning: Goal: Ability to manage health-related needs will improve Outcome: Completed/Met   Problem: Clinical Measurements: Goal: Ability to maintain clinical measurements within normal limits will improve Outcome: Completed/Met Goal: Will remain free from infection Outcome: Completed/Met Goal: Diagnostic test results will improve Outcome: Completed/Met Goal: Respiratory complications will improve Outcome: Completed/Met Goal: Cardiovascular complication will be avoided Outcome: Completed/Met   Problem: Activity: Goal: Risk for activity intolerance will decrease Outcome: Completed/Met   Problem: Nutrition: Goal: Adequate nutrition will be maintained Outcome: Completed/Met   Problem: Coping: Goal: Level of anxiety will decrease Outcome: Completed/Met   Problem: Elimination: Goal: Will not experience complications related to bowel motility Outcome: Completed/Met Goal: Will not experience complications related to urinary retention Outcome: Completed/Met   Problem: Pain Managment: Goal: General experience of comfort will improve Outcome: Completed/Met   Problem: Safety: Goal: Ability to remain free from injury will improve Outcome: Completed/Met   Problem: Skin Integrity: Goal: Risk for impaired skin integrity will decrease Outcome: Completed/Met   Problem: Education: Goal: Ability to identify signs and symptoms of gastrointestinal bleeding will improve Outcome: Completed/Met   Problem: Bowel/Gastric: Goal: Will show no signs and symptoms of gastrointestinal bleeding Outcome: Completed/Met   Problem: Fluid Volume: Goal: Will show no signs and symptoms of excessive bleeding Outcome: Completed/Met   Problem:  Clinical Measurements: Goal: Complications related to the disease process, condition or treatment will be avoided or minimized Outcome: Completed/Met

## 2020-06-15 NOTE — Progress Notes (Signed)
PT Cancellation Note  Patient Details Name: Joyce Potter MRN: [id number] DOB: 1946-09-26   Cancelled Treatment:    Reason Eval/Treat Not Completed: Other (comment).  PT consult received.  Chart reviewed.  Pt resting in bed talking on phone upon PT arrival.  Pt reports that she has been walking to the bathroom by herself and does not need any physical therapy (pt pleasantly declined physical therapy evaluation); pt also reports plan to discharge home today.  Will re-attempt PT evaluation tomorrow, if needed, if pt does not discharge today.  Leitha Bleak, PT 06/15/20, 10:00 AM

## 2020-06-15 NOTE — Discharge Summary (Signed)
Physician Discharge Summary  Joyce Potter DTO:[id number] DOB: Jun 05, 1946 DOA: 06/12/2020  PCP: [name], MD  Admit date: 06/12/2020 Discharge date: 06/15/2020  Admitted From: home Disposition:  home  Recommendations for Outpatient Follow-up:  1. Follow up with PCP in 1 week, get CBC to check H&H  2. F/u cardio, Dr. Ubaldo Glassing, in 1-2 weeks 3. F/u GI as needed  Home Health: no Equipment/Devices:   Discharge Condition: stable CODE STATUS: full  Diet recommendation: Heart Healthy / Carb Modified   Brief/Interim Summary: HPI was taken from Dr. Sidney Ace: Joyce Potter  is a 74 y.o. pleasant Caucasian female with a known history of multiple medical problems that are mentioned below, presented to the emergency room with acute onset of recurrent melena.  The patient had a PCI and cardiac stent on 5/20 at which time she was placed on Plavix added to her regular aspirin.  She has had work-up for melena that included EGD that was unremarkable and this was followed by capsule endoscopy that showed small bowel bleeding.  She was sent today by Dr. Bonna Gains to the ER.  She denies any abdominal pain but has been having belching and admitted to nausea.  She has been having mild low back pain.  No dysuria, or hematuria or urgency or frequency or flank pain.  She has been having diminished urine output though.  On presentation to the emergency room, blood pressure was 141/112 with otherwise normal vital signs.  BP later on was 142/60.  Labs revealed blood glucose 162 with otherwise unremarkable CMP.  CBC showed hemoglobin of 6.8 hematocrit 22.6 compared to 7.5 and 24 on 6/14 and 7.9 hemoglobin on 6/9.  She had normal INR of 1 and PT of 13.1.  She was typed and crossmatch and is being transfused 1 unit of packed red blood cells.  She is given 5 mg of p.o. oxycodone and 40 mg of IV Protonix as well as Lidoderm patch.  She will be admitted to a progressive unit bed for further evaluation and management.     Hospital Course from Dr. Lenise Herald 6/18-6/20/21: Pt presented w/ GI bleeding and was told by GI, Dr. Bonna Gains to come to the ER. EGD was done which showed 3 angiodysplastic lesions in the jejunum that was treated w/ argon plasma coagulation. Pt's home dose of aspirin and plavix were held. Also, pt did receive 1 unit of PRBCs while inpatient. Pt was restarted back on aspirin & plavix prior to d/c. Pt will f/u cardio as an outpatient in 1- 2 weeks. For more information, please see previous progress notes.   Discharge Diagnoses:  Active Problems:   GI bleeding  GI bleeding: secondary to angiodysplastic lesion. Will restart aspirin, plavix as per cardio, GI.  S/p 1 unit of pRBCs transfused. Continue on PPI. EGD showed 3 angiodysplastic lesions in the jejunum that was treated w/ argon plasma coagulation 06/13/20 as per GI   Acute blood loss anemia: secondary to above. S/p 1 unit of pRBCs transfused. H&H trending down today slightly. Will continue to monitor   CAD: s/p PCI and stent. Continue on aspirin & plavix  DM2: continue on SSI w/ accuchecks while inpatient   Peripheral neuropathy: continue on home dose of gabapentin  Hypertension: continue on home dose of metoprolol  Dyslipidemia: continue on statin    Discharge Instructions  Discharge Instructions    Diet - low sodium heart healthy   Complete by: As directed    Diet Carb Modified   Complete by:  As directed    Discharge instructions   Complete by: As directed    F/u PCP in 1-2 weeks. F/u cardio, Dr. Ubaldo Glassing, in 1-2 weeks. Ok to restart plavix as per cardio. F/u GI as needed   Increase activity slowly   Complete by: As directed      Allergies as of 06/15/2020      Reactions   Invokamet [canagliflozin-metformin Hcl] Other (See Comments)   Yeast infection   Sulfa Antibiotics Other (See Comments)   "Got Drunk"   Aspirin Other (See Comments)   ulcers   Lovastatin Rash   Penicillins Rash   Has patient had a PCN  reaction causing immediate rash, facial/tongue/throat swelling, SOB or lightheadedness with hypotension:Yes Has patient had a PCN reaction causing severe rash involving mucus membranes or skin necrosis:all over body Has patient had a PCN reaction that required hospitalization: No Has patient had a PCN reaction occurring within the last 10 years: No If all of the above answers are "NO", then may proceed with Cephalosporin use.   Vicodin [hydrocodone-acetaminophen] Rash      Medication List    TAKE these medications   alendronate 70 MG tablet Commonly known as: FOSAMAX Take 70 mg by mouth once a week.   amitriptyline 25 MG tablet Commonly known as: ELAVIL Take 25 mg by mouth at bedtime as needed for sleep.   aspirin 81 MG EC tablet Take 1 tablet (81 mg total) by mouth daily. Swallow whole. Start taking on: June 16, 2020   clopidogrel 75 MG tablet Commonly known as: PLAVIX Take 1 tablet (75 mg total) by mouth daily with breakfast.   doxycycline 100 MG tablet Commonly known as: VIBRA-TABS Take 100 mg by mouth 2 (two) times daily.   gabapentin 300 MG capsule Commonly known as: NEURONTIN Take 300 mg by mouth 2 (two) times daily.   insulin NPH-regular Human (70-30) 100 UNIT/ML injection Inject 60-80 Units into the skin See admin instructions. Inject 60 units subcutaneously every morning and 30 units every afternoon   isosorbide mononitrate 30 MG 24 hr tablet Commonly known as: IMDUR Take 30 mg by mouth daily.   lisinopril 10 MG tablet Commonly known as: ZESTRIL Take 10 mg by mouth daily.   metoprolol tartrate 100 MG tablet Commonly known as: LOPRESSOR Take 100 mg by mouth in the morning, at noon, and at bedtime.   pantoprazole 40 MG tablet Commonly known as: PROTONIX Take 40 mg by mouth daily.   pravastatin 20 MG tablet Commonly known as: PRAVACHOL Take 20 mg by mouth at bedtime.   ProAir HFA 108 (90 Base) MCG/ACT inhaler Generic drug: albuterol Inhale 2 puffs  into the lungs every 4 (four) hours as needed for wheezing or shortness of breath.   saccharomyces boulardii 250 MG capsule Commonly known as: FLORASTOR Take 250 mg by mouth 2 (two) times daily.   Spiriva HandiHaler 18 MCG inhalation capsule Generic drug: tiotropium   traMADol 50 MG tablet Commonly known as: ULTRAM Take 50 mg by mouth every 6 (six) hours as needed for moderate pain.   Trulicity 1.5 WN/0.2VO Sopn Generic drug: Dulaglutide Inject 0.5 mLs into the skin once a week.       Allergies  Allergen Reactions  . Invokamet [Canagliflozin-Metformin Hcl] Other (See Comments)    Yeast infection  . Sulfa Antibiotics Other (See Comments)    "Got Drunk"  . Aspirin Other (See Comments)    ulcers  . Lovastatin Rash  . Penicillins Rash    Has  patient had a PCN reaction causing immediate rash, facial/tongue/throat swelling, SOB or lightheadedness with hypotension:Yes Has patient had a PCN reaction causing severe rash involving mucus membranes or skin necrosis:all over body Has patient had a PCN reaction that required hospitalization: No Has patient had a PCN reaction occurring within the last 10 years: No If all of the above answers are "NO", then may proceed with Cephalosporin use.   . Vicodin [Hydrocodone-Acetaminophen] Rash    Consultations:  Cardio  GI   Procedures/Studies: No results found. EGD showed 3 angiodysplastic lesions in the jejunum that was treated w/ argon plasma coagulation 06/13/20 as per GI    Subjective: Pt c/o dark stools.   Discharge Exam: Vitals:   06/14/20 2102 06/15/20 0626  BP: (!) 121/50 (!) 118/57  Pulse: 71 70  Resp: 18 16  Temp: 98.6 F (37 C) 98.4 F (36.9 C)  SpO2: 96% 98%   Vitals:   06/14/20 1618 06/14/20 2102 06/15/20 0500 06/15/20 0626  BP: 126/62 (!) 121/50  (!) 118/57  Pulse: 69 71  70  Resp:  18  16  Temp: 98.3 F (36.8 C) 98.6 F (37 C)  98.4 F (36.9 C)  TempSrc: Oral Oral  Oral  SpO2: 99% 96%  98%  Weight:    91.6 kg   Height:        General: Pt is alert, awake, not in acute distress Cardiovascular:  S1/S2 +, no rubs, no gallops Respiratory: CTA bilaterally, no wheezing, no rhonchi Abdominal: Soft, NT, ND, bowel sounds + Extremities: no cyanosis    The results of significant diagnostics from this hospitalization (including imaging, microbiology, ancillary and laboratory) are listed below for reference.     Microbiology: Recent Results (from the past 240 hour(s))  SARS Coronavirus 2 by RT PCR (hospital order, performed in [facility] hospital lab) Nasopharyngeal Nasopharyngeal Swab     Status: None   Collection Time: 06/12/20  7:47 PM   Specimen: Nasopharyngeal Swab  Result Value Ref Range Status   SARS Coronavirus 2 NEGATIVE NEGATIVE Final    Comment: (NOTE) SARS-CoV-2 target nucleic acids are NOT DETECTED.  The SARS-CoV-2 RNA is generally detectable in upper and lower respiratory specimens during the acute phase of infection. The lowest concentration of SARS-CoV-2 viral copies this assay can detect is 250 copies / mL. A negative result does not preclude SARS-CoV-2 infection and should not be used as the sole basis for treatment or other patient management decisions.  A negative result may occur with improper specimen collection / handling, submission of specimen other than nasopharyngeal swab, presence of viral mutation(s) within the areas targeted by this assay, and inadequate number of viral copies (<250 copies / mL). A negative result must be combined with clinical observations, patient history, and epidemiological information.  Fact Sheet for Patients:   StrictlyIdeas.no  Fact Sheet for Healthcare Providers: BankingDealers.co.za  This test is not yet approved or  cleared by the [place] FDA and has been authorized for detection and/or diagnosis of SARS-CoV-2 by FDA under an Emergency Use Authorization (EUA).  This EUA  will remain in effect (meaning this test can be used) for the duration of the COVID-19 declaration under Section 564(b)(1) of the Act, 21 U.S.C. section 360bbb-3(b)(1), unless the authorization is terminated or revoked sooner.  Performed at [facility], [address] [place], [place] [address]      Labs: BNP (last 3 results) No results for input(s): BNP in the last 8760 hours. Basic Metabolic Panel: Recent  Labs  Lab 06/12/20 1348 06/13/20 0451 06/14/20 1007 06/15/20 0430  NA 140 140 138 139  K 4.2 4.0 3.7 3.9  CL 107 109 109 109  CO2 25 26 23 22   GLUCOSE 162* 94 157* 125*  BUN 19 16 10 10   CREATININE 0.83 0.80 0.73 0.86  CALCIUM 9.0 8.3* 8.0* 8.4*   Liver Function Tests: Recent Labs  Lab 06/12/20 1348  AST 21  ALT 18  ALKPHOS 71  BILITOT 0.6  PROT 6.9  ALBUMIN 3.7   No results for input(s): LIPASE, AMYLASE in the last 168 hours. No results for input(s): AMMONIA in the last 168 hours. CBC: Recent Labs  Lab 06/09/20 1018 06/09/20 1018 06/12/20 1348 06/12/20 2300 06/13/20 0451 06/13/20 1131 06/13/20 1513 06/14/20 1007 06/15/20 0430  WBC 4.2  --  5.2  --  5.2  --   --  7.2 4.2  NEUTROABS 2.7  --   --   --   --   --   --   --   --   HGB 7.5*   < > 6.8*   < > 7.8* 7.9* 8.9* 7.7* 7.9*  HCT 24.0*   < > 22.6*   < > 25.4* 24.1* 27.6* 23.6* 26.0*  MCV 81.9  --  86.6  --  85.8  --   --  82.5 87.2  PLT 266  --  316  --  287  --   --  247 253   < > = values in this interval not displayed.   Cardiac Enzymes: No results for input(s): CKTOTAL, CKMB, CKMBINDEX, TROPONINI in the last 168 hours. BNP: Invalid input(s): POCBNP CBG: Recent Labs  Lab 06/14/20 1123 06/14/20 1621 06/14/20 2112 06/15/20 0755 06/15/20 1157  GLUCAP 142* 119* 152* 133* 145*   D-Dimer No results for input(s): DDIMER in the last 72 hours. Hgb A1c No results for input(s): HGBA1C in the last 72 hours. Lipid Profile No results for input(s): CHOL, HDL, LDLCALC, TRIG,  CHOLHDL, LDLDIRECT in the last 72 hours. Thyroid function studies No results for input(s): TSH, T4TOTAL, T3FREE, THYROIDAB in the last 72 hours.  Invalid input(s): FREET3 Anemia work up No results for input(s): VITAMINB12, FOLATE, FERRITIN, TIBC, IRON, RETICCTPCT in the last 72 hours. Urinalysis    Component Value Date/Time   COLORURINE YELLOW (A) 07/26/2019 1754   APPEARANCEUR HAZY (A) 07/26/2019 1754   APPEARANCEUR Clear 05/03/2013 1702   LABSPEC 1.031 (H) 07/26/2019 1754   LABSPEC 1.006 05/03/2013 1702   PHURINE 5.0 07/26/2019 1754   GLUCOSEU >=500 (A) 07/26/2019 1754   GLUCOSEU Negative 05/03/2013 1702   HGBUR NEGATIVE 07/26/2019 1754   BILIRUBINUR NEGATIVE 07/26/2019 1754   BILIRUBINUR Negative 05/03/2013 1702   KETONESUR 5 (A) 07/26/2019 1754   PROTEINUR 100 (A) 07/26/2019 1754   NITRITE NEGATIVE 07/26/2019 1754   LEUKOCYTESUR NEGATIVE 07/26/2019 1754   LEUKOCYTESUR 1+ 05/03/2013 1702   Sepsis Labs Invalid input(s): PROCALCITONIN,  WBC,  LACTICIDVEN Microbiology Recent Results (from the past 240 hour(s))  SARS Coronavirus 2 by RT PCR (hospital order, performed in [place] hospital lab) Nasopharyngeal Nasopharyngeal Swab     Status: None   Collection Time: 06/12/20  7:47 PM   Specimen: Nasopharyngeal Swab  Result Value Ref Range Status   SARS Coronavirus 2 NEGATIVE NEGATIVE Final    Comment: (NOTE) SARS-CoV-2 target nucleic acids are NOT DETECTED.  The SARS-CoV-2 RNA is generally detectable in upper and lower respiratory specimens during the acute phase of infection. The lowest concentration  of SARS-CoV-2 viral copies this assay can detect is 250 copies / mL. A negative result does not preclude SARS-CoV-2 infection and should not be used as the sole basis for treatment or other patient management decisions.  A negative result may occur with improper specimen collection / handling, submission of specimen other than nasopharyngeal swab, presence of viral  mutation(s) within the areas targeted by this assay, and inadequate number of viral copies (<250 copies / mL). A negative result must be combined with clinical observations, patient history, and epidemiological information.  Fact Sheet for Patients:   StrictlyIdeas.no  Fact Sheet for Healthcare Providers: BankingDealers.co.za  This test is not yet approved or  cleared by the [place] FDA and has been authorized for detection and/or diagnosis of SARS-CoV-2 by FDA under an Emergency Use Authorization (EUA).  This EUA will remain in effect (meaning this test can be used) for the duration of the COVID-19 declaration under Section 564(b)(1) of the Act, 21 U.S.C. section 360bbb-3(b)(1), unless the authorization is terminated or revoked sooner.  Performed at [facility], [address] [place], [place] [address]      Time coordinating discharge: Over 30 minutes  SIGNED:   [name], MD  [facility] 06/15/2020, 12:22 PM Pager   If 7PM-7AM, please contact night-coverage [email or web address]

## 2020-06-16 ENCOUNTER — Encounter: Payer: Self-pay | Admitting: Gastroenterology

## 2020-06-16 ENCOUNTER — Inpatient Hospital Stay: Payer: Medicare HMO

## 2020-06-16 ENCOUNTER — Other Ambulatory Visit: Payer: Self-pay | Admitting: *Deleted

## 2020-06-16 ENCOUNTER — Other Ambulatory Visit: Payer: Self-pay

## 2020-06-16 NOTE — Patient Outreach (Signed)
[facility]) Care Management  06/16/2020  Joyce Potter Feb 24, 1946 [id number]   [facility] outreach to [facility] referred patient after re admission   [name] was referred to [facility] on 05/29/20 for EMMI general discharge (on APL) after her discharge from [place] for Day #4, Wednesday 05/28/20 1034for Unfilled prescriptions. This EMMI was addressed on 06/03/20 when was reach via phone  [facility] RN CM consulted with [facility] pharmacy staff, Dr Abbey Chatters who had also received a pharmacy referral for Lantus and Spiriva from the patient's MD office [name] had been sent patient assistance applications to her home address for Spiriva  Insurance:[employer]etna medicare [facility] admissions x 3ED visits x 3in the last 6 months  Last admission on 06/12/20-06/15/20 for GI bleeding secondary to angiodysplastic lesion  05/20/20 to 05/23/20 [facility] GI bleeding and anemia  Outreach successful  Patient is able to verify HIPAA ([place] and Accountability Act) identifiers, date of birth (DOB) and address Reviewed purpose of the follow up after hospital discharge with patient    Review of recent admission and discharge sheets [name] confirms going to the ED for GI bleed with a stent being placed but after arrival home she began to bleed again and returned to have an endoscopy completed Confirms discharge on 06/15/20   Chronic obstructive pulmonary disease (COPD) She confirms she is not seen by a pulmonologist for her COPD but she is assisted by Dr Humphrey Rolls and/or Dr Ubaldo Glassing her cardiologist She reports having various allergies She discussed her father's respiratory history Her father and sister had "Alpha one" She discusses her father's assistance in helping her as a child to decrease her bronchitis/asthma symptoms with measurement at a birch tree  She reports decreased symptoms as a child until the age of "[age]" and more symptoms in 2021 She confirms  increase symptoms to include coughing with increased GERD (gastroesophageal reflux disease) symptoms. She reports she previously lived in a home that was found to have mold and mildew but moved to an apartment     Pharmacy needs [facility] RN CM discussed the collaboration wth [facility] pharmacy staff about the 2021 [employer] medication formulary and patient assistance programs She was informed that the patient assistance forms were mailed to her  She confirms she has the forms but has not had the time to gather the support documents to complete the application at this time  Encouraged completion of applications with supporting documents  Gastrointestinal bleeding (GIB)/anemia Discussed GIB and anemia EGD showed 3 jejunum angiodysplastic lesions-argon plasma coagulation tx, ASA, Plavix Questions answered She confirms with brief review of Transition of care assessment she has her medications to include her Aspirin and Plavix with review of her hospital After summary visit (AVS)  She discussed not being able to see all her MDs within 2 weeks as she does not have the co pay funds. Discussed [facility] does not have a program to assist with co pays. Discussed possible options to get co pays family, friends, church, spacing visits out and speaking with office staff, having virtual or telephonic follow up etc. She reports there is no co pay to see her primary care provider (PCP) only with the cardiologist and Gastroenterologist    EMMIs sent via mail as requested for GI bleeding, breathing tests,alpha1 , peptic ulcers, preventing falls, COPD what you can do     Social:[name] is a 74 year old female who has support of her family Her husband  has dementia (she the primary caregiver) She has support of her daughter and son She lives in a mobile home and denies care needs or transportation needs at this time   Conditions Gastrointestinal (GI) bleeding,  Anemia, Coronary Artery Disease (CAD), Chronic obstructive  pulmonary disease (COPD), Hypertension (HTN), peptic ulcer disease (PUD), Hyperlipidemia (HLD), Diabetes (DM) type 2(05/21/20 HgA1c 7.3), cerebrovascular accident (CVA), peripheral vascular disease (PVD), multinodular goiter, history (hx) of multiple rib fractures, snoring, chronic fatigue obesity, former smoker incontinence of bowel and bladder Falls 2+ no injury- fell 4 months ago on her stesp and twisted her ankle- he son has fixed the step to her mobile home Entry in the mobile home has 7-8 steps on the porch.  DME glucometercane glasses, cpap  Appointments Infusions on 06/17/20 06/23/20, 07/03/20, 08/04/20, 08/05/20, 09/25/20, 09/26/20  06/20/20 Dr Gabriel Carina lab 06/27/20 Dr Gabriel Carina endocrinology 07/16/20 Dr Bonna Gains Gastroenterology 09/02/20 Dr Christianne Borrow Cardiology  09/26/20 Dr Lilian Kapur Oncology  02/20/21 Dr Leotis Pain vascular surgeon  Plan: [facility] RN CM willoutreach to [name] within the next 7-10 business days  Pt encouraged to return a call to [facility] RN CM prn   Routed note to MDs/NP/PA  [facility] CM Care Plan Problem One     Most Recent Value  Care Plan Problem One knowledge deficit for home care of major medical conditions  Role Documenting the Problem One Care Management Telephonic Coordinator  Care Plan for Problem One Active  [facility] Long Term Goal  over the next 90 days patient will be able to verbalize with outreach interventions to manage GIB, HTN, DM, COPD  [facility] Long Term Goal Start Date 06/03/20  Interventions for Problem One Long Term Goal TOC assessment review of AVS, discussed option for MD f/u with concerns with co pays ot CV/GI, COPD assessment, answered questions, assessed for worsening s/s, encouaged completion of spiriva med assist application sent EMMI via mail for GIB, PUD, falls, COPD, breathing tests, alpha one  [facility] CM Short Term Goal #1  over the next 30 days patient will receive assistance for cost concerns with Spiriva and Lantus  [facility] CM Short Term Goal #1 Start Date 06/03/20   Interventions for Short Term Goal #1 updated on med assist forms being sent for spiriva, encouraged completion of application with supporting documents to be sent back to [place], answered questions     Oaklyn Jakubek L. [name], RN, BSN, [place] Coordinator Office number [phone] Mobile number [phone]  Main [facility] number [phone] Fax number [phone]

## 2020-06-17 ENCOUNTER — Other Ambulatory Visit: Payer: Self-pay

## 2020-06-17 ENCOUNTER — Encounter: Payer: Self-pay | Admitting: Oncology

## 2020-06-17 ENCOUNTER — Inpatient Hospital Stay: Payer: Medicare HMO

## 2020-06-17 VITALS — BP 139/59 | HR 68 | Temp 98.1°F | Resp 18

## 2020-06-17 DIAGNOSIS — D500 Iron deficiency anemia secondary to blood loss (chronic): Secondary | ICD-10-CM | POA: Diagnosis present

## 2020-06-17 DIAGNOSIS — I10 Essential (primary) hypertension: Secondary | ICD-10-CM | POA: Diagnosis not present

## 2020-06-17 DIAGNOSIS — K921 Melena: Secondary | ICD-10-CM | POA: Diagnosis not present

## 2020-06-17 DIAGNOSIS — Z8719 Personal history of other diseases of the digestive system: Secondary | ICD-10-CM | POA: Diagnosis not present

## 2020-06-17 DIAGNOSIS — R079 Chest pain, unspecified: Secondary | ICD-10-CM

## 2020-06-17 DIAGNOSIS — Z87891 Personal history of nicotine dependence: Secondary | ICD-10-CM | POA: Diagnosis not present

## 2020-06-17 MED ORDER — SODIUM CHLORIDE 0.9 % IV SOLN: 200.0000 mg | Freq: Once | INTRAVENOUS | Status: DC

## 2020-06-17 MED ORDER — SODIUM CHLORIDE 0.9 % IV SOLN
Freq: Once | INTRAVENOUS | Status: AC
  Filled 2020-06-17: qty 250

## 2020-06-17 MED ORDER — IRON SUCROSE 20 MG/ML IV SOLN
200.0000 mg | Freq: Once | INTRAVENOUS | Status: AC
  Administered 2020-06-17: 200 mg via INTRAVENOUS
  Filled 2020-06-17: qty 10

## 2020-06-17 NOTE — Progress Notes (Signed)
Patient reports she has been having aching chest pain to left upper chest since this morning. Patient reports pain gets worse when she is active and gets better when she is resting. Patient rates pain level as 4 on a 0-10 pain scale. Vital signs stable, see vital sign flow sheet. NP, Faythe Casa, notified and aware. NP coming to see patient at chair side.  1324- NP, Faythe Casa, at chair side. NP, placed order for EKG to be done at this time.  1340- EKG reviewed by NP, Faythe Casa, in person at chair side. Per NP verbal order: proceed with scheduled Venofer treatment today; patient can be discharged to home after Venofer treatment.  1440- Patient states, "The pain is better." Patient rates pain level as 1 on a 0-10 pain scale. NP, Faythe Casa, notified. Per NP order: patient may be discharged to home post Venofer treatment.   1457- Patient and vital signs stable. Patient discharged to home at this time.

## 2020-06-17 NOTE — Progress Notes (Signed)
Re: Chest Pain  Joyce Potter presents to our clinic today for her second dose of IV Feraheme.  She was recently hospitalized for active bleeding d/t angiodysplastic lesion in the jejunum that was treated with argon plasma coagulation and required 1 unit packed red blood cells.  Hemoglobin was stable at 7.9 at discharge.  She is currently on aspirin and Plavix for CAD status post PCI and stent back and May 2021.  Today patient complains of intermittent left-sided chest pain that is worse with exertion.  Unclear etiology but likely multifactorial.  Will obtain an EKG to rule out MI.  Vital signs are stable.  Heart rate is stable.   EKG is within baseline and reads normal sinus rhythm.  Proceed with IV iron.  Educated patient that if symptoms worsen she needs to present directly to the emergency room for further work-up and evaluation given her history.  Dr. Grayland Ormond updated.  Joyce Casa, NP 06/17/2020 2:49 PM

## 2020-06-19 ENCOUNTER — Other Ambulatory Visit: Payer: Self-pay | Admitting: Pharmacy Technician

## 2020-06-19 ENCOUNTER — Encounter: Payer: Self-pay | Admitting: *Deleted

## 2020-06-19 NOTE — Patient Outreach (Signed)
[facility]) Care Management  06/19/2020  Joyce Potter [birth year] [id number]   Unsuccessful call placed to patient regarding patient assistance application(s) for Spiriva with BI , HIPAA compliant voicemail left.   Was calling to inquire if patient had received the application that was mailed to her on 06/10/2020.  Follow up:  Will follow up with 2nd outreach call in 5-7 business days if call is not returned.  Treg Diemer P. Lorea Kupfer, [place]  [phone]

## 2020-06-20 DIAGNOSIS — M810 Age-related osteoporosis without current pathological fracture: Secondary | ICD-10-CM | POA: Diagnosis not present

## 2020-06-20 DIAGNOSIS — E1165 Type 2 diabetes mellitus with hyperglycemia: Secondary | ICD-10-CM | POA: Diagnosis not present

## 2020-06-20 DIAGNOSIS — E1159 Type 2 diabetes mellitus with other circulatory complications: Secondary | ICD-10-CM | POA: Diagnosis not present

## 2020-06-23 ENCOUNTER — Inpatient Hospital Stay: Payer: Medicare HMO

## 2020-06-23 ENCOUNTER — Encounter: Payer: Self-pay | Admitting: Gastroenterology

## 2020-06-23 ENCOUNTER — Emergency Department
Admission: EM | Admit: 2020-06-23 | Discharge: 2020-06-23 | Disposition: A | Discharge status: Home / Self Care | Payer: Medicare HMO | Source: Home / Self Care | Attending: Emergency Medicine | Admitting: Emergency Medicine

## 2020-06-23 ENCOUNTER — Telehealth: Payer: Self-pay

## 2020-06-23 ENCOUNTER — Other Ambulatory Visit: Payer: Self-pay

## 2020-06-23 ENCOUNTER — Encounter: Payer: Self-pay | Admitting: Emergency Medicine

## 2020-06-23 ENCOUNTER — Other Ambulatory Visit: Payer: Self-pay | Admitting: *Deleted

## 2020-06-23 DIAGNOSIS — R531 Weakness: Secondary | ICD-10-CM | POA: Insufficient documentation

## 2020-06-23 DIAGNOSIS — I10 Essential (primary) hypertension: Secondary | ICD-10-CM | POA: Insufficient documentation

## 2020-06-23 DIAGNOSIS — I2510 Atherosclerotic heart disease of native coronary artery without angina pectoris: Secondary | ICD-10-CM | POA: Insufficient documentation

## 2020-06-23 DIAGNOSIS — R42 Dizziness and giddiness: Secondary | ICD-10-CM | POA: Insufficient documentation

## 2020-06-23 DIAGNOSIS — Z8719 Personal history of other diseases of the digestive system: Secondary | ICD-10-CM

## 2020-06-23 DIAGNOSIS — Z794 Long term (current) use of insulin: Secondary | ICD-10-CM | POA: Insufficient documentation

## 2020-06-23 DIAGNOSIS — D500 Iron deficiency anemia secondary to blood loss (chronic): Secondary | ICD-10-CM | POA: Diagnosis not present

## 2020-06-23 DIAGNOSIS — E1165 Type 2 diabetes mellitus with hyperglycemia: Secondary | ICD-10-CM | POA: Insufficient documentation

## 2020-06-23 DIAGNOSIS — Z7982 Long term (current) use of aspirin: Secondary | ICD-10-CM | POA: Insufficient documentation

## 2020-06-23 DIAGNOSIS — K922 Gastrointestinal hemorrhage, unspecified: Secondary | ICD-10-CM | POA: Insufficient documentation

## 2020-06-23 DIAGNOSIS — Z87891 Personal history of nicotine dependence: Secondary | ICD-10-CM | POA: Insufficient documentation

## 2020-06-23 DIAGNOSIS — Z79899 Other long term (current) drug therapy: Secondary | ICD-10-CM | POA: Insufficient documentation

## 2020-06-23 DIAGNOSIS — Z880 Allergy status to penicillin: Secondary | ICD-10-CM | POA: Insufficient documentation

## 2020-06-23 DIAGNOSIS — Z951 Presence of aortocoronary bypass graft: Secondary | ICD-10-CM | POA: Insufficient documentation

## 2020-06-23 LAB — CBC
HCT: 26.4 % — ABNORMAL LOW (ref 36.0–46.0)
Hemoglobin: 8.3 g/dL — ABNORMAL LOW (ref 12.0–15.0)
MCH: 26.2 pg (ref 26.0–34.0)
MCHC: 31.4 g/dL (ref 30.0–36.0)
MCV: 83.3 fL (ref 80.0–100.0)
Platelets: 356 10*3/uL (ref 150–400)
RBC: 3.17 MIL/uL — ABNORMAL LOW (ref 3.87–5.11)
RDW: 17.3 % — ABNORMAL HIGH (ref 11.5–15.5)
WBC: 5.1 10*3/uL (ref 4.0–10.5)
nRBC: 0 % (ref 0.0–0.2)

## 2020-06-23 LAB — COMPREHENSIVE METABOLIC PANEL
ALT: 22 U/L (ref 0–44)
AST: 23 U/L (ref 15–41)
Albumin: 3.9 g/dL (ref 3.5–5.0)
Alkaline Phosphatase: 66 U/L (ref 38–126)
Anion gap: 11 (ref 5–15)
BUN: 23 mg/dL (ref 8–23)
CO2: 27 mmol/L (ref 22–32)
Calcium: 9 mg/dL (ref 8.9–10.3)
Chloride: 99 mmol/L (ref 98–111)
Creatinine, Ser: 1.09 mg/dL — ABNORMAL HIGH (ref 0.44–1.00)
GFR calc Af Amer: 59 mL/min — ABNORMAL LOW (ref >60)
GFR calc non Af Amer: 51 mL/min — ABNORMAL LOW (ref >60)
Glucose, Bld: 126 mg/dL — ABNORMAL HIGH (ref 70–99)
Potassium: 4.2 mmol/L (ref 3.5–5.1)
Sodium: 137 mmol/L (ref 135–145)
Total Bilirubin: 0.8 mg/dL (ref 0.3–1.2)
Total Protein: 7.2 g/dL (ref 6.5–8.1)

## 2020-06-23 LAB — HEMOGLOBIN AND HEMATOCRIT, BLOOD
HCT: 27.9 % — ABNORMAL LOW (ref 36.0–46.0)
Hemoglobin: 8.6 g/dL — ABNORMAL LOW (ref 12.0–15.0)

## 2020-06-23 LAB — TYPE AND SCREEN
ABO/RH(D): O POS
Antibody Screen: NEGATIVE

## 2020-06-23 LAB — TROPONIN I (HIGH SENSITIVITY): Troponin I (High Sensitivity): 6 ng/L (ref <18)

## 2020-06-23 NOTE — Telephone Encounter (Signed)
Patient called stating that she had her endoscopy done last week on 06/13/2020 and she is still having black stools and feels really tired and fatigued. Patient was requesting to be seen today before 1 PM since she had an infusion scheduled at 2 PM.  I know that you are on call this week. Can you please advise.

## 2020-06-23 NOTE — Telephone Encounter (Signed)
Hi I have informed Dr Bonna Gains , my afternoon is fully overbooked. Unable to add in

## 2020-06-23 NOTE — Telephone Encounter (Signed)
Virgel Manifold, MD  You 18 minutes ago (9:23 AM)   Overbook with any provider in the office today. I am not in the office today so cannot see her    Dr. Vicente Males,, would you be able to see Dr. Michele Mcalpine patient at 1 PM today? Patient has an infusion scheduled at 2 PM today at the [facility]. Please let me know. Thank you!

## 2020-06-23 NOTE — ED Triage Notes (Signed)
Patient to ER for c/o rectal bleeding. Patient states she has had bleeding since May. Noticed bright red blood today with black stools. Patient reports +weakness and +nausea today.

## 2020-06-23 NOTE — ED Provider Notes (Signed)
[facility] Emergency Department Provider Note   ____________________________________________   First MD Initiated Contact with Patient 06/23/20 1843     (approximate)  I have reviewed the triage vital signs and the nursing notes.   HISTORY  Chief Complaint Rectal Bleeding and Weakness    HPI Joyce Potter is a 74 y.o. female with past medical history of hypertension, hyperlipidemia, CAD, diabetes, and jejunal AVM's who presents to the ED complaining of GI bleed.  Patient was recently admitted for bleeding jejunal AVMs treated with APC, also required transfusion at that time.  She left the hospital about 1 week ago, when they also restarted her aspirin and Plavix due to cardiac stent placement on May 20.  She states that 2 to 3 days ago she started to notice dark black stools again, has since progressively felt more lightheaded and weak.  She is concerned that her blood counts could have dropped.  Her last bowel movement was earlier this morning and black at that time, she has not passed any more blood or stool since then.  She denies any fevers, cough, chest pain, shortness of breath.        Past Medical History:  Diagnosis Date  . Anemia    iron deficiency  . Aortic stenosis   . Basal cell carcinoma   . CAD (coronary artery disease)    s/p Left circumflex stent in 2012  . Carotid stenosis   . Cataract   . High cholesterol   . HTN (hypertension)   . Hyperlipidemia   . IDDM (insulin dependent diabetes mellitus)   . Multiple thyroid nodules    Benign  . Peptic ulcer disease   . Retinal artery occlusion    on left    Patient Active Problem List   Diagnosis Date Noted  . GI bleeding 06/12/2020  . Diabetes mellitus without complication ([place]) 99/83/3825  . Anemia due to blood loss 05/20/2020  . Blood loss anemia   . Chronic fatigue 03/21/2020  . Diabetes mellitus type 2 in obese ([place]) 03/21/2020  . Obesity, Class II, BMI 35-39.9 03/21/2020   . Snoring 03/21/2020  . Background diabetic retinopathy associated with type 2 diabetes mellitus ([place]) 06/06/2019  . Uncontrolled type 2 diabetes mellitus with hyperglycemia ([place]) 04/30/2019  . Osteoporosis, post-menopausal 04/30/2019  . Multiple rib fractures 10/11/2018  . GIB (gastrointestinal bleeding) 07/17/2018  . Melena   . Symptomatic anemia   . PVD (peripheral vascular disease) ([place]) 02/11/2017  . Iron deficiency anemia due to chronic blood loss 07/13/2016  . Multinodular goiter 01/25/2016  . Central retinal artery occlusion 10/01/2015  . Carotid stenosis 10/01/2015  . Stroke ([place]) 09/28/2015  . Aortic stenosis, moderate 11/29/2014  . PUD (peptic ulcer disease) 07/17/2014  . HLD (hyperlipidemia) 05/07/2013  . GI bleed from an occult source with recurrent chronic blood loss anemia 04/14/2013  . CAD (coronary artery disease) 04/14/2013  . IDDM (insulin dependent diabetes mellitus) 04/14/2013  . HTN (hypertension) 04/14/2013  . Chest pain 04/14/2013    Past Surgical History:  Procedure Laterality Date  . ABDOMINAL HYSTERECTOMY    . ARTERY BIOPSY Left 11/19/2015   Procedure: BIOPSY TEMPORAL ARTERY;  Surgeon: Algernon Huxley, MD;  Location: [facility] ORS;  Service: Vascular;  Laterality: Left;  . BREAST BIOPSY Left 2002   core- neg  . BREAST EXCISIONAL BIOPSY    . CARDIAC CATHETERIZATION N/A 08/08/2015   Procedure: Right and Left Heart Cath and Coronary Angiography;  Surgeon: Teodoro Spray, MD;  Location: [place] CV LAB;  Service: Cardiovascular;  Laterality: N/A;  . CARDIAC CATHETERIZATION Bilateral 09/03/2016   Procedure: Right/Left Heart Cath and Coronary Angiography;  Surgeon: Teodoro Spray, MD;  Location: [place] CV LAB;  Service: Cardiovascular;  Laterality: Bilateral;  . CATARACT EXTRACTION W/ INTRAOCULAR LENS  IMPLANT, BILATERAL    . COLONOSCOPY     in 2013- internal hemorrhoids  . COLONOSCOPY WITH PROPOFOL N/A 07/07/2018   Procedure: COLONOSCOPY WITH PROPOFOL;   Surgeon: Manya Silvas, MD;  Location: [facility] ENDOSCOPY;  Service: Endoscopy;  Laterality: N/A;  . COLONOSCOPY WITH PROPOFOL N/A 05/22/2020   Procedure: COLONOSCOPY WITH PROPOFOL;  Surgeon: Jonathon Bellows, MD;  Location: [facility] ENDOSCOPY;  Service: Gastroenterology;  Laterality: N/A;  . CORONARY ANGIOGRAPHY N/A 09/14/2017   Procedure: CORONARY ANGIOGRAPHY;  Surgeon: Teodoro Spray, MD;  Location: [place] CV LAB;  Service: Cardiovascular;  Laterality: N/A;  . CORONARY ANGIOPLASTY    . CORONARY STENT INTERVENTION N/A 05/15/2020   Procedure: coronary angiography;  Surgeon: Teodoro Spray, MD;  Location: [place] CV LAB;  Service: Cardiovascular;  Laterality: N/A;  . CORONARY STENT INTERVENTION N/A 05/15/2020   Procedure: CORONARY STENT INTERVENTION;  Surgeon: Isaias Cowman, MD;  Location: [place] CV LAB;  Service: Cardiovascular;  Laterality: N/A;  . CORONARY STENT PLACEMENT    . ENDARTERECTOMY Left 10/01/2015   Procedure: ENDARTERECTOMY CAROTID;  Surgeon: Algernon Huxley, MD;  Location: [facility] ORS;  Service: Vascular;  Laterality: Left;  . ESOPHAGOGASTRODUODENOSCOPY    . ESOPHAGOGASTRODUODENOSCOPY (EGD) WITH PROPOFOL N/A 05/31/2016   Procedure: ESOPHAGOGASTRODUODENOSCOPY (EGD) WITH PROPOFOL;  Surgeon: Manya Silvas, MD;  Location: [facility] ENDOSCOPY;  Service: Endoscopy;  Laterality: N/A;  . ESOPHAGOGASTRODUODENOSCOPY (EGD) WITH PROPOFOL N/A 07/07/2018   Procedure: ESOPHAGOGASTRODUODENOSCOPY (EGD) WITH PROPOFOL;  Surgeon: Manya Silvas, MD;  Location: [facility] ENDOSCOPY;  Service: Endoscopy;  Laterality: N/A;  . ESOPHAGOGASTRODUODENOSCOPY (EGD) WITH PROPOFOL N/A 07/17/2018   Procedure: ESOPHAGOGASTRODUODENOSCOPY (EGD) WITH PROPOFOL;  Surgeon: Lucilla Lame, MD;  Location: [facility] ENDOSCOPY;  Service: Endoscopy;  Laterality: N/A;  . ESOPHAGOGASTRODUODENOSCOPY (EGD) WITH PROPOFOL N/A 05/21/2020   Procedure: ESOPHAGOGASTRODUODENOSCOPY (EGD) WITH PROPOFOL;  Surgeon: Jonathon Bellows, MD;  Location: [facility]  ENDOSCOPY;  Service: Gastroenterology;  Laterality: N/A;  . ESOPHAGOGASTRODUODENOSCOPY (EGD) WITH PROPOFOL N/A 06/13/2020   Procedure: ESOPHAGOGASTRODUODENOSCOPY (EGD) WITH PROPOFOL;  Surgeon: Lucilla Lame, MD;  Location: [facility] ENDOSCOPY;  Service: Endoscopy;  Laterality: N/A;  . EYE SURGERY    . GIVENS CAPSULE STUDY N/A 04/17/2013   Procedure: GIVENS CAPSULE STUDY;  Surgeon: Arta Silence, MD;  Location: [facility] ENDOSCOPY;  Service: Endoscopy;  Laterality: N/A;  patient ate breakfast at 7am   . GIVENS CAPSULE STUDY N/A 06/11/2020   Procedure: GIVENS CAPSULE STUDY;  Surgeon: Virgel Manifold, MD;  Location: [facility] ENDOSCOPY;  Service: Endoscopy;  Laterality: N/A;  . HEMORRHOID BANDING  07/27/2016   Dr Nicholes Stairs  . PARTIAL HYSTERECTOMY    . RIGHT AND LEFT HEART CATH Bilateral 09/14/2017   Procedure: RIGHT AND LEFT HEART CATH;  Surgeon: Teodoro Spray, MD;  Location: [place] CV LAB;  Service: Cardiovascular;  Laterality: Bilateral;  . RIGHT/LEFT HEART CATH AND CORONARY ANGIOGRAPHY Right 05/15/2020   Procedure: Right/Left Heart cath;  Surgeon: Teodoro Spray, MD;  Location: [place] CV LAB;  Service: Cardiovascular;  Laterality: Right;  . TRIGGER FINGER RELEASE      Prior to Admission medications   Medication Sig Start Date End Date Taking? Authorizing Provider  albuterol (PROAIR HFA) 108 (90 Base) MCG/ACT  inhaler Inhale 2 puffs into the lungs every 4 (four) hours as needed for wheezing or shortness of breath.     [provider]  alendronate (FOSAMAX) 70 MG tablet Take 70 mg by mouth once a week.     [provider]  amitriptyline (ELAVIL) 25 MG tablet Take 25 mg by mouth at bedtime as needed for sleep.    [provider]  aspirin EC 81 MG EC tablet Take 1 tablet (81 mg total) by mouth daily. Swallow whole. 06/16/20   [name], MD  clopidogrel (PLAVIX) 75 MG tablet Take 1 tablet (75 mg total) by mouth daily with breakfast. 05/16/20   [name], Sheppard Coil,  MD  doxycycline (VIBRA-TABS) 100 MG tablet Take 100 mg by mouth 2 (two) times daily.    [provider]  gabapentin (NEURONTIN) 300 MG capsule Take 300 mg by mouth 2 (two) times daily.  04/30/19   [provider]  insulin NPH-regular Human (NOVOLIN 70/30) (70-30) 100 UNIT/ML injection Inject 60-80 Units into the skin See admin instructions. Inject 60 units subcutaneously every morning and 30 units every afternoon    [provider]  isosorbide mononitrate (IMDUR) 30 MG 24 hr tablet Take 30 mg by mouth daily.     [provider]  lisinopril (ZESTRIL) 10 MG tablet Take 10 mg by mouth daily. 12/18/19   [provider]  metoprolol tartrate (LOPRESSOR) 100 MG tablet Take 100 mg by mouth in the morning, at noon, and at bedtime.    [provider]  pantoprazole (PROTONIX) 40 MG tablet Take 40 mg by mouth daily. 05/17/20   [provider]  pravastatin (PRAVACHOL) 20 MG tablet Take 20 mg by mouth at bedtime. 05/01/20   [provider]  saccharomyces boulardii (FLORASTOR) 250 MG capsule Take 250 mg by mouth 2 (two) times daily.    [provider]  SPIRIVA HANDIHALER 18 MCG inhalation capsule  06/02/20   [provider]  traMADol (ULTRAM) 50 MG tablet Take 50 mg by mouth every 6 (six) hours as needed for moderate pain.    [provider]  TRULICITY 1.5 AC/1.6SA SOPN Inject 0.5 mLs into the skin once a week. 03/21/20   [provider]    Allergies Invokamet [canagliflozin-metformin hcl], Sulfa antibiotics, Aspirin, Lovastatin, Penicillins, and Vicodin [hydrocodone-acetaminophen]  Family History  Problem Relation Age of Onset  . Uterine cancer Mother   . Breast cancer Mother 39  . Seizures Father   . Stroke Father   . Diabetes Father   . COPD Father   . Colon cancer Neg Hx     Social History Social History   Tobacco Use  . Smoking status: Former Smoker    Quit date: 02/15/1985    Years since  quitting: 35.3  . Smokeless tobacco: Never Used  . Tobacco comment: quit about 31 years ago  Vaping Use  . Vaping Use: Never used  Substance Use Topics  . Alcohol use: No    Alcohol/week: 0.0 standard drinks  . Drug use: No    Review of Systems  Constitutional: No fever/chills Eyes: No visual changes. ENT: No sore throat. Cardiovascular: Denies chest pain. Respiratory: Denies shortness of breath. Gastrointestinal: No abdominal pain.  No nausea, no vomiting.  No diarrhea.  No constipation.  Positive for melena. Genitourinary: Negative for dysuria. Musculoskeletal: Negative for back pain. Skin: Negative for rash. Neurological: Negative for headaches, focal weakness or numbness.  ____________________________________________   PHYSICAL EXAM:  VITAL SIGNS: ED Triage  Vitals  Enc Vitals Group     BP 06/23/20 1516 (!) 131/107     Pulse Rate 06/23/20 1516 77     Resp 06/23/20 1516 18     Temp 06/23/20 1516 98.3 F (36.8 C)     Temp Source 06/23/20 1516 Oral     SpO2 06/23/20 1516 100 %     Weight 06/23/20 1517 201 lb 15.1 oz (91.6 kg)     Height 06/23/20 1517 5\' 3"  (1.6 m)     Head Circumference --      Peak Flow --      Pain Score 06/23/20 1517 0     Pain Loc --      Pain Edu? --      Excl. in [place]? --     Constitutional: Alert and oriented. Eyes: Conjunctivae are normal. Head: Atraumatic. Nose: No congestion/rhinnorhea. Mouth/Throat: Mucous membranes are moist. Neck: Normal ROM Cardiovascular: Normal rate, regular rhythm. Grossly normal heart sounds. Respiratory: Normal respiratory effort.  No retractions. Lungs CTAB. Gastrointestinal: Soft and nontender. No distention.  Rectal exam with melanotic guaiac positive stool. Genitourinary: deferred Musculoskeletal: No lower extremity tenderness nor edema. Neurologic:  Normal speech and language. No gross focal neurologic deficits are appreciated. Skin:  Skin is warm, dry and intact. No rash noted. Psychiatric: Mood and  affect are normal. Speech and behavior are normal.  ____________________________________________   LABS (all labs ordered are listed, but only abnormal results are displayed)  Labs Reviewed  COMPREHENSIVE METABOLIC PANEL - Abnormal; Notable for the following components:      Result Value   Glucose, Bld 126 (*)    Creatinine, Ser 1.09 (*)    GFR calc non Af Amer 51 (*)    GFR calc Af Amer 59 (*)    All other components within normal limits  CBC - Abnormal; Notable for the following components:   RBC 3.17 (*)    Hemoglobin 8.3 (*)    HCT 26.4 (*)    RDW 17.3 (*)    All other components within normal limits  HEMOGLOBIN AND HEMATOCRIT, BLOOD - Abnormal; Notable for the following components:   Hemoglobin 8.6 (*)    HCT 27.9 (*)    All other components within normal limits  TYPE AND SCREEN  TROPONIN I (HIGH SENSITIVITY)   ____________________________________________  EKG  ED ECG REPORT I, Blake Divine, the attending physician, personally viewed and interpreted this ECG.   Date: 06/23/2020  EKG Time: 15:23  Rate: 80  Rhythm: normal sinus rhythm  Axis: Normal  Intervals:none  ST&T Change: Nonspecific T wave changes   PROCEDURES  Procedure(s) performed (including Critical Care):  Procedures   ____________________________________________   INITIAL IMPRESSION / ASSESSMENT AND PLAN / ED COURSE       74 year old female with past medical history of CAD, hypertension, diabetes, hyperlipidemia, and jejunal AVMs status post APC presents to the ED with a couple episodes of melena over the past couple of days since her recent discharge 8 days ago.  Her last melanotic bowel movement was earlier this morning, she has no active bleeding at this time and is hemodynamically stable.  Blood work obtained from triage is reassuring, H&H is improved following her recent discharge.  Repeat H&H was performed 5 hours out from initial and continues to trend upward.  Remainder of labs are  unremarkable and case was discussed with Dr. Allen Norris of GI.  He agrees that given there does not seem to be any acute bleeding patient be appropriate  for discharge home with close outpatient follow-up.  She was counseled to return to the ED for any worsening bleeding, otherwise schedule follow-up with GI.  Patient agrees with plan.      ____________________________________________   FINAL CLINICAL IMPRESSION(S) / ED DIAGNOSES  Final diagnoses:  History of GI bleed     ED Discharge Orders    None       Note:  This document was prepared using Dragon voice recognition software and may include unintentional dictation errors.   Blake Divine, MD 06/23/20 1929

## 2020-06-23 NOTE — ED Notes (Signed)
This RN in with MD to perform stool check. Pt tolerated well. Blood drawn and sent to lab. Son to the bedside. Pt understands plan of care.

## 2020-06-23 NOTE — Telephone Encounter (Signed)
Dr. Bonna Gains is requesting for her hemoglobin to be checked before she has her infusion at the [facility] per secure chat. I added Dr. Grayland Ormond in our conversation and he was okay to add a lab only before getting her infusion. However, patient told me then that she was having active bleeding when she goes to the bathroom. I right a way mentioned it to the physicians and Dr. Bonna Gains recommended for her to go to the ED instead of going to the [facility]. I then informed patient to go to the ED and she agreed.

## 2020-06-23 NOTE — Patient Outreach (Signed)
[facility]) Care Management  06/23/2020  Joyce Potter 1946/08/29 [id number]  [facility] outreach to [facility] referred patient after re admission   [name] was referred to [facility] on 05/29/20 for EMMI general discharge (on APL) after her discharge from [place] for Day #4, Wednesday 05/28/20 1057for Unfilled prescriptions. This EMMI was addressed on 06/03/20 when was reach via phone  [place] consulted with [facility] pharmacy staff, Dr Abbey Chatters who had also received a pharmacy referral for Lantus and Spiriva from the patient's MD office [name] had been sent patient assistance applications to her home address for Spiriva  Insurance:[employer] medicare [facility] admissions x 3ED visits x 3in the last 6 months Last admission on 06/12/20-06/15/20 for GI bleeding secondary to angiodysplastic lesion  05/20/20 to 05/23/20 [place] bleeding and anemia     New saturday 06/21/20 1013 am EMMII general red alert  Other questions/problems?- yes  Outreach attempt unsuccessful  No answer. [facility] RN CM left HIPAA [place] Portability and Accountability Act) compliant voicemail message along with CM's contact info at the listed home/mobile number It also appears pt is scheduled for an iron infusion for today per Epic listed appointments     Social:[name] is a 74 year old female who has support of her familyHer husband has dementia (she the primary caregiver) She has support of her daughter and son She lives in a mobile home and denies care needs or transportation needs at this time   ConditionsGastrointestinal (GI)bleeding, Anemia, Coronary Artery Disease (CAD),Chronic obstructive pulmonary disease (COPD),Hypertension (HTN), peptic ulcer disease (PUD),Hyperlipidemia (HLD),Diabetes (DM) type 2(05/21/20 HgA1c 7.3),cerebrovascular accident (CVA),peripheral vascular disease (PVD), multinodular goiter,history (hx)of multiple rib  fractures, snoring, chronic fatigue obesity, former smoker incontinence of bowel and bladder Falls 2+ no injury- fell 4 months ago on her stesp and twisted her ankle- he son has fixed the step to her mobile home Entry in the mobile home has 7-8 steps on the porch.  DMEglucometercane glasses, cpap  Appointments Infusions on 06/17/20 06/23/20, 07/03/20, 08/04/20, 08/05/20, 09/25/20, 09/26/20  06/20/20 Dr Gabriel Carina lab 06/27/20 Dr Gabriel Carina endocrinology 07/16/20 Dr Bonna Gains Gastroenterology 09/02/20 Dr Christianne Borrow Cardiology  09/26/20 Dr Lilian Kapur Oncology  02/20/21 Dr Leotis Pain vascular surgeon   Plan: [facility] RN CM scheduled this patient for another call attempt within 4 -7 business days  [name] L. [name], RN, BSN, [facility] Coordinator Office number [phone] Mobile number [phone]  Main [facility] number [phone] Fax number [phone]

## 2020-06-24 ENCOUNTER — Other Ambulatory Visit: Payer: Self-pay | Admitting: Pharmacy Technician

## 2020-06-24 NOTE — Patient Outreach (Signed)
[facility]) Care Management  06/24/2020  KATERIA CUTRONA May 29, 1946 [id number]    Unsuccessful call placed to patient regarding patient assistance application(s) for Spiriva with [employer] , HIPAA compliant voicemail left.   Today was the 2nd unsuccessful call attempt. Was calling patient to inquire if she has mailed back the application that she informed [facility] RN Jackelyn Poling as having received it on 06/16/2020. Due to the inability to maintain contact with patient will close patient's medication assistance case at this time. Will gladly reopen case if the necessary forms are mailed back.  Follow up:  Will route note to [place] for case closure and will remove myself from care teams.   Zamani Crocker P. Regine Christian, [place]  [phone]

## 2020-06-26 ENCOUNTER — Ambulatory Visit: Payer: Self-pay | Admitting: *Deleted

## 2020-06-26 ENCOUNTER — Other Ambulatory Visit: Payer: Self-pay | Admitting: *Deleted

## 2020-06-27 ENCOUNTER — Ambulatory Visit: Payer: Self-pay | Admitting: *Deleted

## 2020-06-27 DIAGNOSIS — E042 Nontoxic multinodular goiter: Secondary | ICD-10-CM | POA: Diagnosis not present

## 2020-06-27 DIAGNOSIS — E1169 Type 2 diabetes mellitus with other specified complication: Secondary | ICD-10-CM | POA: Diagnosis not present

## 2020-06-27 DIAGNOSIS — E1159 Type 2 diabetes mellitus with other circulatory complications: Secondary | ICD-10-CM | POA: Diagnosis not present

## 2020-06-27 DIAGNOSIS — G4733 Obstructive sleep apnea (adult) (pediatric): Secondary | ICD-10-CM | POA: Insufficient documentation

## 2020-06-27 DIAGNOSIS — E1142 Type 2 diabetes mellitus with diabetic polyneuropathy: Secondary | ICD-10-CM | POA: Diagnosis not present

## 2020-06-27 DIAGNOSIS — E669 Obesity, unspecified: Secondary | ICD-10-CM | POA: Diagnosis not present

## 2020-06-27 DIAGNOSIS — Z9289 Personal history of other medical treatment: Secondary | ICD-10-CM | POA: Insufficient documentation

## 2020-06-27 DIAGNOSIS — R809 Proteinuria, unspecified: Secondary | ICD-10-CM | POA: Diagnosis not present

## 2020-06-27 DIAGNOSIS — M810 Age-related osteoporosis without current pathological fracture: Secondary | ICD-10-CM | POA: Diagnosis not present

## 2020-06-27 DIAGNOSIS — E1129 Type 2 diabetes mellitus with other diabetic kidney complication: Secondary | ICD-10-CM | POA: Diagnosis not present

## 2020-07-01 DIAGNOSIS — I10 Essential (primary) hypertension: Secondary | ICD-10-CM | POA: Diagnosis not present

## 2020-07-01 DIAGNOSIS — I2510 Atherosclerotic heart disease of native coronary artery without angina pectoris: Secondary | ICD-10-CM | POA: Diagnosis not present

## 2020-07-01 DIAGNOSIS — E118 Type 2 diabetes mellitus with unspecified complications: Secondary | ICD-10-CM | POA: Diagnosis not present

## 2020-07-01 DIAGNOSIS — D649 Anemia, unspecified: Secondary | ICD-10-CM | POA: Diagnosis not present

## 2020-07-01 DIAGNOSIS — D508 Other iron deficiency anemias: Secondary | ICD-10-CM | POA: Diagnosis not present

## 2020-07-02 ENCOUNTER — Encounter: Payer: Self-pay | Admitting: Gastroenterology

## 2020-07-02 ENCOUNTER — Ambulatory Visit: Payer: Self-pay | Admitting: *Deleted

## 2020-07-02 ENCOUNTER — Ambulatory Visit: Payer: Medicare HMO | Admitting: Gastroenterology

## 2020-07-02 ENCOUNTER — Other Ambulatory Visit: Payer: Self-pay

## 2020-07-02 VITALS — BP 155/81 | HR 90 | Temp 98.7°F | Wt 192.0 lb

## 2020-07-02 DIAGNOSIS — D509 Iron deficiency anemia, unspecified: Secondary | ICD-10-CM

## 2020-07-03 ENCOUNTER — Inpatient Hospital Stay: Payer: Medicare HMO | Attending: Oncology

## 2020-07-03 VITALS — BP 112/70 | HR 77 | Temp 98.9°F | Resp 16

## 2020-07-03 DIAGNOSIS — D500 Iron deficiency anemia secondary to blood loss (chronic): Secondary | ICD-10-CM | POA: Diagnosis present

## 2020-07-03 DIAGNOSIS — K921 Melena: Secondary | ICD-10-CM | POA: Diagnosis present

## 2020-07-03 LAB — HEMOGLOBIN: Hemoglobin: 7.5 g/dL — ABNORMAL LOW (ref 11.1–15.9)

## 2020-07-03 MED ORDER — SODIUM CHLORIDE 0.9 % IV SOLN: 200.0000 mg | Freq: Once | INTRAVENOUS | Status: DC

## 2020-07-03 MED ORDER — IRON SUCROSE 20 MG/ML IV SOLN
200.0000 mg | Freq: Once | INTRAVENOUS | Status: AC
  Administered 2020-07-03: 200 mg via INTRAVENOUS
  Filled 2020-07-03: qty 10

## 2020-07-03 MED ORDER — SODIUM CHLORIDE 0.9 % IV SOLN
Freq: Once | INTRAVENOUS | Status: AC
  Filled 2020-07-03: qty 250

## 2020-07-03 NOTE — Progress Notes (Signed)
Joyce Antigua, MD [address]  [place]  [place], [place] [address]  Main: [phone]  Fax: [phone]   Primary Care Physician: [name], MD   Chief Complaint  Patient presents with  . GI Bleeding    HPI: Joyce Potter is a 74 y.o. female here for follow-up of iron deficiency anemia and AVMs.  Patient was recently sent to the ER she reports black stool on Plavix.  Hemoglobin showed stable blood work and patient was sent home.  Patient has also recently undergone enteroscopy with Dr. 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  Patient is currently on aspirin and Plavix    Current Outpatient Medications  Medication Sig Dispense Refill  . albuterol (PROAIR HFA) 108 (90 Base) MCG/ACT inhaler Inhale 2 puffs into the lungs every 4 (four) hours as needed for wheezing or shortness of breath.     Marland Kitchen alendronate (FOSAMAX) 70 MG tablet Take 70 mg by mouth once a week.     Marland Kitchen aspirin EC 81 MG EC tablet Take 1 tablet (81 mg total) by mouth daily. Swallow whole. 30 tablet 11  . clopidogrel (PLAVIX) 75 MG tablet Take 1 tablet (75 mg total) by mouth daily with breakfast. 30 tablet 11  . gabapentin (NEURONTIN) 300 MG capsule Take 300 mg by mouth 2 (two) times daily.     . insulin NPH-regular Human (NOVOLIN 70/30) (70-30) 100 UNIT/ML injection Inject 60-80 Units into the skin See admin instructions. Inject 60 units subcutaneously every morning and 30 units every afternoon    . isosorbide mononitrate (IMDUR) 30 MG 24 hr tablet Take 30 mg by mouth daily.     Marland Kitchen lisinopril (ZESTRIL) 10 MG tablet Take 10 mg by mouth daily.    . metoprolol tartrate (LOPRESSOR) 100 MG tablet Take 100 mg by mouth in the morning, at noon, and at bedtime.    . pantoprazole (PROTONIX) 40 MG tablet Take 40 mg by mouth daily.    . pravastatin (PRAVACHOL) 20 MG tablet Take 20 mg by mouth at bedtime.    Marland Kitchen SPIRIVA HANDIHALER 18 MCG inhalation capsule     . TRULICITY 1.5 GE/3.6OQ  SOPN Inject 0.5 mLs into the skin once a week.    Marland Kitchen amitriptyline (ELAVIL) 25 MG tablet Take 25 mg by mouth at bedtime as needed for sleep. (Patient not taking: Reported on 07/02/2020)    . doxycycline (VIBRA-TABS) 100 MG tablet Take 100 mg by mouth 2 (two) times daily. (Patient not taking: Reported on 07/02/2020)    . saccharomyces boulardii (FLORASTOR) 250 MG capsule Take 250 mg by mouth 2 (two) times daily. (Patient not taking: Reported on 07/02/2020)    . traMADol (ULTRAM) 50 MG tablet Take 50 mg by mouth every 6 (six) hours as needed for moderate pain. (Patient not taking: Reported on 07/02/2020)     No current facility-administered medications for this visit.   Facility-Administered Medications Ordered in Other Visits  Medication Dose Route Frequency Provider Last Rate Last Admin  . heparin lock flush 100 unit/mL  500 Units Intravenous Once Verlon Au, NP      . sodium chloride flush (NS) 0.9 % injection 10 mL  10 mL Intravenous Once Verlon Au, NP        Allergies as of 07/02/2020 - Review Complete 07/02/2020  Allergen Reaction Noted  . Invokamet [canagliflozin-metformin hcl] Other (See Comments) 08/08/2015  . Sulfa antibiotics Other (See Comments) 04/13/2013  . Aspirin Other (See  Comments) 09/28/2015  . Lovastatin Rash 08/08/2015  . Penicillins Rash 04/13/2013  . Vicodin [hydrocodone-acetaminophen] Rash 04/13/2013    ROS:  General: Negative for anorexia, weight loss, fever, chills, fatigue, weakness. ENT: Negative for hoarseness, difficulty swallowing , nasal congestion. CV: Negative for chest pain, angina, palpitations, dyspnea on exertion, peripheral edema.  Respiratory: Negative for dyspnea at rest, dyspnea on exertion, cough, sputum, wheezing.  GI: See history of present illness. GU:  Negative for dysuria, hematuria, urinary incontinence, urinary frequency, nocturnal urination.  Endo: Negative for unusual weight change.    Physical Examination:   BP (!) 155/81    Pulse 90   Temp 98.7 F (37.1 C) (Oral)   Wt 192 lb (87.1 kg)   BMI 34.01 kg/m   General: Well-nourished, well-developed in no acute distress.  Eyes: No icterus. Conjunctivae pink. Mouth: Oropharyngeal mucosa moist and pink , no lesions erythema or exudate. Neck: Supple, Trachea midline Abdomen: Bowel sounds are normal, nontender, nondistended, no hepatosplenomegaly or masses, no abdominal bruits or hernia , no rebound or guarding.   Extremities: No lower extremity edema. No clubbing or deformities. Neuro: Alert and oriented x 3.  Grossly intact. Skin: Warm and dry, no jaundice.   Psych: Alert and cooperative, normal mood and affect.   Labs: CMP     Component Value Date/Time   NA 137 06/23/2020 1521   NA 143 05/04/2013 0614   K 4.2 06/23/2020 1521   K 3.8 05/04/2013 0614   CL 99 06/23/2020 1521   CL 110 (H) 05/04/2013 0614   CO2 27 06/23/2020 1521   CO2 27 05/04/2013 0614   GLUCOSE 126 (H) 06/23/2020 1521   GLUCOSE 130 (H) 05/04/2013 0614   BUN 23 06/23/2020 1521   BUN 12 05/04/2013 0614   CREATININE 1.09 (H) 06/23/2020 1521   CREATININE 0.79 08/27/2014 1445   CALCIUM 9.0 06/23/2020 1521   CALCIUM 8.2 (L) 05/04/2013 0614   PROT 7.2 06/23/2020 1521   PROT 7.6 08/27/2014 1445   ALBUMIN 3.9 06/23/2020 1521   ALBUMIN 3.5 08/27/2014 1445   AST 23 06/23/2020 1521   AST 27 08/27/2014 1445   ALT 22 06/23/2020 1521   ALT 27 08/27/2014 1445   ALKPHOS 66 06/23/2020 1521   ALKPHOS 101 08/27/2014 1445   BILITOT 0.8 06/23/2020 1521   BILITOT 0.4 08/27/2014 1445   GFRNONAA 51 (L) 06/23/2020 1521   GFRNONAA >60 08/27/2014 1445   GFRAA 59 (L) 06/23/2020 1521   GFRAA >60 08/27/2014 1445    Imaging Studies: No results found.  Assessment and Plan:   Joyce Potter is a 74 y.o. y/o female with iron deficiency anemia and jejunal AVMs  Obtain hemoglobin level today Patient has appointment at the cancer center tomorrow Follow-up with them for iron and PRBC transfusion as  needed Follow-up with cardiology for aspirin and Plavix  No evidence of active GI bleeding at this time Patient hemodynamically stable  If any evidence of active GI bleeding occurs, she was asked to go to the ER for notify us and she verbalized understanding  Follow-up in clinic closely  Dr Joyce Potter

## 2020-07-07 ENCOUNTER — Telehealth: Payer: Self-pay

## 2020-07-07 NOTE — Telephone Encounter (Signed)
Virgel Manifold, MD  07/03/2020 11:06 AM EDT Back to Top    Nicki Furlan please let the patient know, her hemoglobin remains low. She does not need emergent transfusion at this time. I am forwarding results to Dr. Grayland Ormond as well, so they can order iron and PRBC transfusions as needed. It appears she has an appointment with them today, she should keep this appointment and follow-up with them closely. Please overbook her and change her follow-up with me, for 4 to 6 weeks. Virtual visit or phone visit okay   Patient Communication  Patient was contacted about her iron being low and that she needed to be seen again in 4-6 weeks to make sure that after getting an infusion she was doing better. Patient understood and had no further questions.

## 2020-07-10 DIAGNOSIS — I6523 Occlusion and stenosis of bilateral carotid arteries: Secondary | ICD-10-CM | POA: Diagnosis not present

## 2020-07-10 DIAGNOSIS — I10 Essential (primary) hypertension: Secondary | ICD-10-CM | POA: Diagnosis not present

## 2020-07-10 DIAGNOSIS — I2510 Atherosclerotic heart disease of native coronary artery without angina pectoris: Secondary | ICD-10-CM | POA: Diagnosis not present

## 2020-07-10 DIAGNOSIS — I350 Nonrheumatic aortic (valve) stenosis: Secondary | ICD-10-CM | POA: Diagnosis not present

## 2020-07-10 DIAGNOSIS — I700 Atherosclerosis of aorta: Secondary | ICD-10-CM | POA: Diagnosis not present

## 2020-07-10 DIAGNOSIS — E1159 Type 2 diabetes mellitus with other circulatory complications: Secondary | ICD-10-CM | POA: Diagnosis not present

## 2020-07-10 DIAGNOSIS — G4733 Obstructive sleep apnea (adult) (pediatric): Secondary | ICD-10-CM | POA: Diagnosis not present

## 2020-07-10 DIAGNOSIS — I739 Peripheral vascular disease, unspecified: Secondary | ICD-10-CM | POA: Diagnosis not present

## 2020-07-13 DIAGNOSIS — G4733 Obstructive sleep apnea (adult) (pediatric): Secondary | ICD-10-CM | POA: Diagnosis not present

## 2020-07-14 ENCOUNTER — Encounter: Payer: Self-pay | Admitting: *Deleted

## 2020-07-14 NOTE — Telephone Encounter (Signed)
This encounter was created in error - please disregard.

## 2020-07-15 ENCOUNTER — Ambulatory Visit: Payer: Self-pay | Admitting: *Deleted

## 2020-07-16 ENCOUNTER — Ambulatory Visit: Payer: Medicare HMO | Admitting: Gastroenterology

## 2020-07-21 ENCOUNTER — Other Ambulatory Visit: Payer: Self-pay

## 2020-07-21 ENCOUNTER — Other Ambulatory Visit
Admission: RE | Admit: 2020-07-21 | Discharge: 2020-07-21 | Disposition: A | Discharge status: Home / Self Care | Payer: Medicare HMO | Source: Home / Self Care | Attending: Gastroenterology | Admitting: Gastroenterology

## 2020-07-21 ENCOUNTER — Encounter: Payer: Self-pay | Admitting: Emergency Medicine

## 2020-07-21 ENCOUNTER — Inpatient Hospital Stay
Admission: EM | Admit: 2020-07-21 | Discharge: 2020-07-24 | DRG: 811 | Disposition: A | Discharge status: Home / Self Care | Payer: Medicare HMO | Attending: Hospitalist | Admitting: Hospitalist

## 2020-07-21 DIAGNOSIS — Z961 Presence of intraocular lens: Secondary | ICD-10-CM | POA: Diagnosis present

## 2020-07-21 DIAGNOSIS — T360X5A Adverse effect of penicillins, initial encounter: Secondary | ICD-10-CM | POA: Diagnosis present

## 2020-07-21 DIAGNOSIS — Z7982 Long term (current) use of aspirin: Secondary | ICD-10-CM

## 2020-07-21 DIAGNOSIS — Z803 Family history of malignant neoplasm of breast: Secondary | ICD-10-CM

## 2020-07-21 DIAGNOSIS — Z9842 Cataract extraction status, left eye: Secondary | ICD-10-CM

## 2020-07-21 DIAGNOSIS — E669 Obesity, unspecified: Secondary | ICD-10-CM | POA: Diagnosis present

## 2020-07-21 DIAGNOSIS — Z87891 Personal history of nicotine dependence: Secondary | ICD-10-CM

## 2020-07-21 DIAGNOSIS — Z880 Allergy status to penicillin: Secondary | ICD-10-CM

## 2020-07-21 DIAGNOSIS — Z9841 Cataract extraction status, right eye: Secondary | ICD-10-CM

## 2020-07-21 DIAGNOSIS — K31819 Angiodysplasia of stomach and duodenum without bleeding: Secondary | ICD-10-CM | POA: Diagnosis present

## 2020-07-21 DIAGNOSIS — K5521 Angiodysplasia of colon with hemorrhage: Secondary | ICD-10-CM | POA: Diagnosis present

## 2020-07-21 DIAGNOSIS — I10 Essential (primary) hypertension: Secondary | ICD-10-CM | POA: Diagnosis present

## 2020-07-21 DIAGNOSIS — Z7902 Long term (current) use of antithrombotics/antiplatelets: Secondary | ICD-10-CM

## 2020-07-21 DIAGNOSIS — Q2730 Arteriovenous malformation, site unspecified: Secondary | ICD-10-CM

## 2020-07-21 DIAGNOSIS — D500 Iron deficiency anemia secondary to blood loss (chronic): Secondary | ICD-10-CM

## 2020-07-21 DIAGNOSIS — D509 Iron deficiency anemia, unspecified: Secondary | ICD-10-CM | POA: Insufficient documentation

## 2020-07-21 DIAGNOSIS — Z6833 Body mass index (BMI) 33.0-33.9, adult: Secondary | ICD-10-CM

## 2020-07-21 DIAGNOSIS — E119 Type 2 diabetes mellitus without complications: Secondary | ICD-10-CM

## 2020-07-21 DIAGNOSIS — Z79899 Other long term (current) drug therapy: Secondary | ICD-10-CM

## 2020-07-21 DIAGNOSIS — I350 Nonrheumatic aortic (valve) stenosis: Secondary | ICD-10-CM | POA: Diagnosis present

## 2020-07-21 DIAGNOSIS — Z794 Long term (current) use of insulin: Secondary | ICD-10-CM

## 2020-07-21 DIAGNOSIS — Z8049 Family history of malignant neoplasm of other genital organs: Secondary | ICD-10-CM

## 2020-07-21 DIAGNOSIS — Z20822 Contact with and (suspected) exposure to covid-19: Secondary | ICD-10-CM | POA: Diagnosis present

## 2020-07-21 DIAGNOSIS — D649 Anemia, unspecified: Secondary | ICD-10-CM | POA: Diagnosis present

## 2020-07-21 DIAGNOSIS — D62 Acute posthemorrhagic anemia: Secondary | ICD-10-CM

## 2020-07-21 DIAGNOSIS — E7800 Pure hypercholesterolemia, unspecified: Secondary | ICD-10-CM | POA: Diagnosis present

## 2020-07-21 DIAGNOSIS — Z8711 Personal history of peptic ulcer disease: Secondary | ICD-10-CM

## 2020-07-21 DIAGNOSIS — I2510 Atherosclerotic heart disease of native coronary artery without angina pectoris: Secondary | ICD-10-CM | POA: Diagnosis not present

## 2020-07-21 DIAGNOSIS — Z888 Allergy status to other drugs, medicaments and biological substances status: Secondary | ICD-10-CM

## 2020-07-21 DIAGNOSIS — Z85828 Personal history of other malignant neoplasm of skin: Secondary | ICD-10-CM

## 2020-07-21 DIAGNOSIS — E785 Hyperlipidemia, unspecified: Secondary | ICD-10-CM | POA: Diagnosis present

## 2020-07-21 DIAGNOSIS — Z955 Presence of coronary angioplasty implant and graft: Secondary | ICD-10-CM

## 2020-07-21 DIAGNOSIS — Z823 Family history of stroke: Secondary | ICD-10-CM

## 2020-07-21 DIAGNOSIS — Z825 Family history of asthma and other chronic lower respiratory diseases: Secondary | ICD-10-CM

## 2020-07-21 DIAGNOSIS — Z833 Family history of diabetes mellitus: Secondary | ICD-10-CM

## 2020-07-21 DIAGNOSIS — Z882 Allergy status to sulfonamides status: Secondary | ICD-10-CM

## 2020-07-21 DIAGNOSIS — K922 Gastrointestinal hemorrhage, unspecified: Secondary | ICD-10-CM | POA: Diagnosis not present

## 2020-07-21 LAB — CBC WITH DIFFERENTIAL/PLATELET
Abs Immature Granulocytes: 0.03 10*3/uL (ref 0.00–0.07)
Basophils Absolute: 0 10*3/uL (ref 0.0–0.1)
Basophils Relative: 1 %
Eosinophils Absolute: 0 10*3/uL (ref 0.0–0.5)
Eosinophils Relative: 0 %
HCT: 21.1 % — ABNORMAL LOW (ref 36.0–46.0)
Hemoglobin: 6.2 g/dL — ABNORMAL LOW (ref 12.0–15.0)
Immature Granulocytes: 1 %
Lymphocytes Relative: 21 %
Lymphs Abs: 1.3 10*3/uL (ref 0.7–4.0)
MCH: 24.1 pg — ABNORMAL LOW (ref 26.0–34.0)
MCHC: 29.4 g/dL — ABNORMAL LOW (ref 30.0–36.0)
MCV: 82.1 fL (ref 80.0–100.0)
Monocytes Absolute: 0.6 10*3/uL (ref 0.1–1.0)
Monocytes Relative: 10 %
Neutro Abs: 4.1 10*3/uL (ref 1.7–7.7)
Neutrophils Relative %: 67 %
Platelets: 337 10*3/uL (ref 150–400)
RBC: 2.57 MIL/uL — ABNORMAL LOW (ref 3.87–5.11)
RDW: 18.4 % — ABNORMAL HIGH (ref 11.5–15.5)
WBC: 6 10*3/uL (ref 4.0–10.5)
nRBC: 0 % (ref 0.0–0.2)

## 2020-07-21 LAB — COMPREHENSIVE METABOLIC PANEL
ALT: 14 U/L (ref 0–44)
AST: 18 U/L (ref 15–41)
Albumin: 3.8 g/dL (ref 3.5–5.0)
Alkaline Phosphatase: 72 U/L (ref 38–126)
Anion gap: 9 (ref 5–15)
BUN: 23 mg/dL (ref 8–23)
CO2: 25 mmol/L (ref 22–32)
Calcium: 8.4 mg/dL — ABNORMAL LOW (ref 8.9–10.3)
Chloride: 105 mmol/L (ref 98–111)
Creatinine, Ser: 0.9 mg/dL (ref 0.44–1.00)
GFR calc Af Amer: >60 mL/min (ref >60)
GFR calc non Af Amer: >60 mL/min (ref >60)
Glucose, Bld: 146 mg/dL — ABNORMAL HIGH (ref 70–99)
Potassium: 3.8 mmol/L (ref 3.5–5.1)
Sodium: 139 mmol/L (ref 135–145)
Total Bilirubin: 0.6 mg/dL (ref 0.3–1.2)
Total Protein: 7 g/dL (ref 6.5–8.1)

## 2020-07-21 LAB — SARS CORONAVIRUS 2 BY RT PCR (HOSPITAL ORDER, PERFORMED IN ~~LOC~~ HOSPITAL LAB): SARS Coronavirus 2: NEGATIVE

## 2020-07-21 LAB — PREPARE RBC (CROSSMATCH)

## 2020-07-21 LAB — HEMOGLOBIN AND HEMATOCRIT, BLOOD
HCT: 22.9 % — ABNORMAL LOW (ref 36.0–46.0)
Hemoglobin: 6.7 g/dL — ABNORMAL LOW (ref 12.0–15.0)

## 2020-07-21 MED ORDER — SODIUM CHLORIDE 0.9 % IV SOLN: INTRAVENOUS | Status: DC

## 2020-07-21 MED ORDER — IPRATROPIUM-ALBUTEROL 0.5-2.5 (3) MG/3ML IN SOLN: 3.0000 mL | Freq: Four times a day (QID) | RESPIRATORY_TRACT | Status: DC | PRN

## 2020-07-21 MED ORDER — TIOTROPIUM BROMIDE MONOHYDRATE 18 MCG IN CAPS
18.0000 ug | ORAL_CAPSULE | Freq: Every day | RESPIRATORY_TRACT | Status: DC
  Administered 2020-07-23 – 2020-07-24 (×2): 18 ug via RESPIRATORY_TRACT
  Filled 2020-07-21 (×2): qty 5

## 2020-07-21 MED ORDER — PRAVASTATIN SODIUM 20 MG PO TABS
20.0000 mg | ORAL_TABLET | Freq: Every day | ORAL | Status: DC
  Administered 2020-07-22 – 2020-07-23 (×2): 20 mg via ORAL
  Filled 2020-07-21 (×2): qty 1

## 2020-07-21 MED ORDER — SODIUM CHLORIDE 0.9 % IV SOLN
10.0000 mL/h | Freq: Once | INTRAVENOUS | Status: AC
  Administered 2020-07-21: 10 mL/h via INTRAVENOUS

## 2020-07-21 MED ORDER — GABAPENTIN 300 MG PO CAPS
300.0000 mg | ORAL_CAPSULE | Freq: Two times a day (BID) | ORAL | Status: DC
  Administered 2020-07-21 – 2020-07-24 (×6): 300 mg via ORAL
  Filled 2020-07-21 (×6): qty 1

## 2020-07-21 MED ORDER — ONDANSETRON HCL 4 MG/2ML IJ SOLN: 4.0000 mg | Freq: Four times a day (QID) | INTRAMUSCULAR | Status: DC | PRN

## 2020-07-21 MED ORDER — AMITRIPTYLINE HCL 25 MG PO TABS: 25.0000 mg | ORAL_TABLET | Freq: Every evening | ORAL | Status: DC | PRN

## 2020-07-21 MED ORDER — TIOTROPIUM BROMIDE MONOHYDRATE 18 MCG IN CAPS: 18.0000 ug | ORAL_CAPSULE | Freq: Every day | RESPIRATORY_TRACT | Status: DC

## 2020-07-21 MED ORDER — PANTOPRAZOLE SODIUM 40 MG IV SOLR
40.0000 mg | INTRAVENOUS | Status: DC
  Administered 2020-07-21: 40 mg via INTRAVENOUS
  Filled 2020-07-21: qty 40

## 2020-07-21 MED ORDER — ONDANSETRON HCL 4 MG PO TABS: 4.0000 mg | ORAL_TABLET | Freq: Four times a day (QID) | ORAL | Status: DC | PRN

## 2020-07-21 NOTE — ED Notes (Signed)
Pt reports low hgb.  Pt has dark stools.  Taking plavix.  recent cardiac stent placement   Iv in place. Pt alert  Speech clear.    nsr on monitor.

## 2020-07-21 NOTE — ED Notes (Signed)
Pt alert, watching tv.

## 2020-07-21 NOTE — H&P (Signed)
History and Physical    Joyce Potter QQP:[id number] DOB: 1946/03/14 DOA: 07/21/2020  PCP: [name], MD   Patient coming from: Home  I have personally briefly reviewed patient's old medical records in [place]  Chief Complaint: Weakness  HPI: Joyce Potter is a 74 y.o. female with medical history significant for iron deficiency anemia from chronic blood loss, aortic stenosis, dyslipidemia, hypertension, peptic ulcer disease, coronary artery disease status post recent PCI in May, 2021 with a drug-eluting stent who was sent to the emergency room for evaluation of anemia.  Patient had complaints of generalized weakness as well as passage of dark stools for a couple of weeks.  She had blood work done and was found to have a hemoglobin of 6.2g/dl.  She had a recent capsule endoscopy in June which showed active bleeding in the proximal small bowel. She denies having any chest pain, no shortness of breath, no nausea, no vomiting, no fever, no chills, no changes in her bowel habits or any urinary symptoms. Labs reveal hemoglobin of 6.2g/dl and ER provider has already initiated blood transfusion.  She will be referred to observation status for further evaluation.  ED Course: Patient is a 74 year old female who was referred to the emergency room for evaluation of symptomatic anemia related to acute blood loss.  She was found to have a hemoglobin of 6.2g/dl.  Patient is on aspirin and Plavix and is status post recent stent angioplasty which was done on 05/15/20.  She will be referred to observation status for further evaluation.  Review of Systems: As per HPI otherwise 10 point review of systems negative.    Past Medical History:  Diagnosis Date  . Anemia    iron deficiency  . Aortic stenosis   . Basal cell carcinoma   . CAD (coronary artery disease)    s/p Left circumflex stent in 2012  . Carotid stenosis   . Cataract   . High cholesterol   . HTN (hypertension)   .  Hyperlipidemia   . IDDM (insulin dependent diabetes mellitus)   . Multiple thyroid nodules    Benign  . Peptic ulcer disease   . Retinal artery occlusion    on left    Past Surgical History:  Procedure Laterality Date  . ABDOMINAL HYSTERECTOMY    . ARTERY BIOPSY Left 11/19/2015   Procedure: BIOPSY TEMPORAL ARTERY;  Surgeon: Algernon Huxley, MD;  Location: [facility] ORS;  Service: Vascular;  Laterality: Left;  . BREAST BIOPSY Left 2002   core- neg  . BREAST EXCISIONAL BIOPSY    . CARDIAC CATHETERIZATION N/A 08/08/2015   Procedure: Right and Left Heart Cath and Coronary Angiography;  Surgeon: Teodoro Spray, MD;  Location: [place] CV LAB;  Service: Cardiovascular;  Laterality: N/A;  . CARDIAC CATHETERIZATION Bilateral 09/03/2016   Procedure: Right/Left Heart Cath and Coronary Angiography;  Surgeon: Teodoro Spray, MD;  Location: [place] CV LAB;  Service: Cardiovascular;  Laterality: Bilateral;  . CATARACT EXTRACTION W/ INTRAOCULAR LENS  IMPLANT, BILATERAL    . COLONOSCOPY     in 2013- internal hemorrhoids  . COLONOSCOPY WITH PROPOFOL N/A 07/07/2018   Procedure: COLONOSCOPY WITH PROPOFOL;  Surgeon: Manya Silvas, MD;  Location: [facility] ENDOSCOPY;  Service: Endoscopy;  Laterality: N/A;  . COLONOSCOPY WITH PROPOFOL N/A 05/22/2020   Procedure: COLONOSCOPY WITH PROPOFOL;  Surgeon: Jonathon Bellows, MD;  Location: [facility] ENDOSCOPY;  Service: Gastroenterology;  Laterality: N/A;  . CORONARY ANGIOGRAPHY N/A 09/14/2017   Procedure: CORONARY  ANGIOGRAPHY;  Surgeon: Teodoro Spray, MD;  Location: [place] CV LAB;  Service: Cardiovascular;  Laterality: N/A;  . CORONARY ANGIOPLASTY    . CORONARY STENT INTERVENTION N/A 05/15/2020   Procedure: coronary angiography;  Surgeon: Teodoro Spray, MD;  Location: [place] CV LAB;  Service: Cardiovascular;  Laterality: N/A;  . CORONARY STENT INTERVENTION N/A 05/15/2020   Procedure: CORONARY STENT INTERVENTION;  Surgeon: Isaias Cowman, MD;  Location:  [place] CV LAB;  Service: Cardiovascular;  Laterality: N/A;  . CORONARY STENT PLACEMENT    . ENDARTERECTOMY Left 10/01/2015   Procedure: ENDARTERECTOMY CAROTID;  Surgeon: Algernon Huxley, MD;  Location: [facility] ORS;  Service: Vascular;  Laterality: Left;  . ESOPHAGOGASTRODUODENOSCOPY    . ESOPHAGOGASTRODUODENOSCOPY (EGD) WITH PROPOFOL N/A 05/31/2016   Procedure: ESOPHAGOGASTRODUODENOSCOPY (EGD) WITH PROPOFOL;  Surgeon: Manya Silvas, MD;  Location: [facility] ENDOSCOPY;  Service: Endoscopy;  Laterality: N/A;  . ESOPHAGOGASTRODUODENOSCOPY (EGD) WITH PROPOFOL N/A 07/07/2018   Procedure: ESOPHAGOGASTRODUODENOSCOPY (EGD) WITH PROPOFOL;  Surgeon: Manya Silvas, MD;  Location: [facility] ENDOSCOPY;  Service: Endoscopy;  Laterality: N/A;  . ESOPHAGOGASTRODUODENOSCOPY (EGD) WITH PROPOFOL N/A 07/17/2018   Procedure: ESOPHAGOGASTRODUODENOSCOPY (EGD) WITH PROPOFOL;  Surgeon: Lucilla Lame, MD;  Location: [facility] ENDOSCOPY;  Service: Endoscopy;  Laterality: N/A;  . ESOPHAGOGASTRODUODENOSCOPY (EGD) WITH PROPOFOL N/A 05/21/2020   Procedure: ESOPHAGOGASTRODUODENOSCOPY (EGD) WITH PROPOFOL;  Surgeon: Jonathon Bellows, MD;  Location: [facility] ENDOSCOPY;  Service: Gastroenterology;  Laterality: N/A;  . ESOPHAGOGASTRODUODENOSCOPY (EGD) WITH PROPOFOL N/A 06/13/2020   Procedure: ESOPHAGOGASTRODUODENOSCOPY (EGD) WITH PROPOFOL;  Surgeon: Lucilla Lame, MD;  Location: [facility] ENDOSCOPY;  Service: Endoscopy;  Laterality: N/A;  . EYE SURGERY    . GIVENS CAPSULE STUDY N/A 04/17/2013   Procedure: GIVENS CAPSULE STUDY;  Surgeon: Arta Silence, MD;  Location: [facility] ENDOSCOPY;  Service: Endoscopy;  Laterality: N/A;  patient ate breakfast at 7am   . GIVENS CAPSULE STUDY N/A 06/11/2020   Procedure: GIVENS CAPSULE STUDY;  Surgeon: Virgel Manifold, MD;  Location: [facility] ENDOSCOPY;  Service: Endoscopy;  Laterality: N/A;  . HEMORRHOID BANDING  07/27/2016   Dr Nicholes Stairs  . PARTIAL HYSTERECTOMY    . RIGHT AND LEFT HEART CATH Bilateral 09/14/2017   Procedure: RIGHT  AND LEFT HEART CATH;  Surgeon: Teodoro Spray, MD;  Location: [place] CV LAB;  Service: Cardiovascular;  Laterality: Bilateral;  . RIGHT/LEFT HEART CATH AND CORONARY ANGIOGRAPHY Right 05/15/2020   Procedure: Right/Left Heart cath;  Surgeon: Teodoro Spray, MD;  Location: [facility] CV LAB;  Service: Cardiovascular;  Laterality: Right;  . TRIGGER FINGER RELEASE       reports that she quit smoking about 35 years ago. She has never used smokeless tobacco. She reports that she does not drink alcohol and does not use drugs.  Allergies  Allergen Reactions  . Sulfa Antibiotics Other (See Comments)    Altered Mental Status  . Aspirin Other (See Comments)    ulcers  . Invokamet [Canagliflozin-Metformin Hcl] Other (See Comments)    Yeast Infection  . Lovastatin Rash  . Penicillins Rash    Has patient had a PCN reaction causing immediate rash, facial/tongue/throat swelling, SOB or lightheadedness with hypotension:Yes Has patient had a PCN reaction causing severe rash involving mucus membranes or skin necrosis:all over body Has patient had a PCN reaction that required hospitalization: No Has patient had a PCN reaction occurring within the last 10 years: No If all of the above answers are "NO", then may proceed with Cephalosporin use.   . Vicodin [Hydrocodone-Acetaminophen] Rash  Family History  Problem Relation Age of Onset  . Uterine cancer Mother   . Breast cancer Mother 54  . Seizures Father   . Stroke Father   . Diabetes Father   . COPD Father   . Colon cancer Neg Hx      Prior to Admission medications   Medication Sig Start Date End Date Taking? Authorizing Provider  aspirin EC 81 MG EC tablet Take 1 tablet (81 mg total) by mouth daily. Swallow whole. 06/16/20  Yes [name], MD  clopidogrel (PLAVIX) 75 MG tablet Take 1 tablet (75 mg total) by mouth daily with breakfast. 05/16/20  Yes [name], [name], MD  gabapentin (NEURONTIN) 300 MG capsule Take 300 mg  by mouth 2 (two) times daily.  04/30/19  Yes [provider]  insulin NPH-regular Human (NOVOLIN 70/30) (70-30) 100 UNIT/ML injection Inject 30 Units into the skin 2 (two) times daily with a meal.    Yes [provider]  isosorbide mononitrate (IMDUR) 30 MG 24 hr tablet Take 30 mg by mouth daily.    Yes [provider]  lisinopril (ZESTRIL) 5 MG tablet Take 5 mg by mouth daily.  12/18/19  Yes [provider]  metoprolol tartrate (LOPRESSOR) 100 MG tablet Take 150 mg by mouth 2 (two) times daily.    Yes [provider]  pantoprazole (PROTONIX) 40 MG tablet Take 40 mg by mouth daily. 05/17/20  Yes [provider]  pravastatin (PRAVACHOL) 20 MG tablet Take 20 mg by mouth at bedtime. 05/01/20  Yes [provider]  saccharomyces boulardii (FLORASTOR) 250 MG capsule Take 250 mg by mouth 2 (two) times daily.    Yes [provider]  SPIRIVA HANDIHALER 18 MCG inhalation capsule Place 18 mcg into inhaler and inhale daily in the afternoon.  06/02/20  Yes [provider]  albuterol (PROAIR HFA) 108 (90 Base) MCG/ACT inhaler Inhale 2 puffs into the lungs every 4 (four) hours as needed for wheezing or shortness of breath.     [provider]  alendronate (FOSAMAX) 70 MG tablet Take 70 mg by mouth once a week.     [provider]  amitriptyline (ELAVIL) 25 MG tablet Take 25 mg by mouth at bedtime as needed for sleep.     [provider]  traMADol (ULTRAM) 50 MG tablet Take 50 mg by mouth every 6 (six) hours as needed for moderate pain.     [provider]  TRULICITY 1.5 GD/9.2EQ SOPN Inject 0.5 mLs into the skin once a week. 03/21/20   [provider]    Physical Exam: Vitals:   07/21/20 1730 07/21/20 1731 07/21/20 1732 07/21/20 1733  BP:      Pulse: 74 77 75 74  Resp: 21 20 16 23   Temp:      TempSrc:      SpO2: 100% 100% 99% 100%  Weight:      Height:         Vitals:   07/21/20 1730  07/21/20 1731 07/21/20 1732 07/21/20 1733  BP:      Pulse: 74 77 75 74  Resp: 21 20 16 23   Temp:      TempSrc:      SpO2: 100% 100% 99% 100%  Weight:      Height:        Constitutional: NAD, alert and oriented x 3 Eyes: PERRL, lids and conjunctivae pallor ENMT: Mucous membranes are moist.  Neck: normal, supple, no masses, no thyromegaly Respiratory:  clear to auscultation bilaterally, no wheezing, no crackles. Normal respiratory effort. No accessory muscle use.  Cardiovascular: Regular rate and rhythm, no murmurs / rubs / gallops. No extremity edema. 2+ pedal pulses. No carotid bruits.  Abdomen: no tenderness, no masses palpated. No hepatosplenomegaly. Bowel sounds positive.  Musculoskeletal: no clubbing / cyanosis. No joint deformity upper and lower extremities.  Skin: no rashes, lesions, ulcers.  Neurologic: No gross focal neurologic deficit. Psychiatric: Normal mood and affect.   Labs on Admission: I have personally reviewed following labs and imaging studies  CBC: Recent Labs  Lab 07/21/20 1011  WBC 6.0  NEUTROABS 4.1  HGB 6.2*  HCT 21.1*  MCV 82.1  PLT 175   Basic Metabolic Panel: No results for input(s): NA, K, CL, CO2, GLUCOSE, BUN, CREATININE, CALCIUM, MG, PHOS in the last 168 hours. GFR: CrCl cannot be calculated (Patient's most recent lab result is older than the maximum 21 days allowed.). Liver Function Tests: No results for input(s): AST, ALT, ALKPHOS, BILITOT, PROT, ALBUMIN in the last 168 hours. No results for input(s): LIPASE, AMYLASE in the last 168 hours. No results for input(s): AMMONIA in the last 168 hours. Coagulation Profile: No results for input(s): INR, PROTIME in the last 168 hours. Cardiac Enzymes: No results for input(s): CKTOTAL, CKMB, CKMBINDEX, TROPONINI in the last 168 hours. BNP (last 3 results) No results for input(s): PROBNP in the last 8760 hours. HbA1C: No results for input(s): HGBA1C in the last 72 hours. CBG: No results for  input(s): GLUCAP in the last 168 hours. Lipid Profile: No results for input(s): CHOL, HDL, LDLCALC, TRIG, CHOLHDL, LDLDIRECT in the last 72 hours. Thyroid Function Tests: No results for input(s): TSH, T4TOTAL, FREET4, T3FREE, THYROIDAB in the last 72 hours. Anemia Panel: No results for input(s): VITAMINB12, FOLATE, FERRITIN, TIBC, IRON, RETICCTPCT in the last 72 hours. Urine analysis:    Component Value Date/Time   COLORURINE YELLOW (A) 07/26/2019 1754   APPEARANCEUR HAZY (A) 07/26/2019 1754   APPEARANCEUR Clear 05/03/2013 1702   LABSPEC 1.031 (H) 07/26/2019 1754   LABSPEC 1.006 05/03/2013 1702   PHURINE 5.0 07/26/2019 1754   GLUCOSEU >=500 (A) 07/26/2019 1754   GLUCOSEU Negative 05/03/2013 1702   HGBUR NEGATIVE 07/26/2019 1754   BILIRUBINUR NEGATIVE 07/26/2019 1754   BILIRUBINUR Negative 05/03/2013 1702   KETONESUR 5 (A) 07/26/2019 1754   PROTEINUR 100 (A) 07/26/2019 1754   NITRITE NEGATIVE 07/26/2019 1754   LEUKOCYTESUR NEGATIVE 07/26/2019 1754   LEUKOCYTESUR 1+ 05/03/2013 1702    Radiological Exams on Admission: No results found.  EKG: Independently reviewed.   Assessment/Plan Principal Problem:   Acute blood loss anemia Active Problems:   CAD (coronary artery disease)   HTN (hypertension)   Aortic stenosis, moderate   Diabetes mellitus without complication (HCC)    Acute blood loss anemia Patient presents for evaluation of weakness admitted to have a hemoglobin of 6.2 g/kg She has had multiple episodes of passage of melena stool for weeks She is on aspirin and Plavix which are currently on hold We will type and crossmatch and will transfuse 2 units of packed RBC Patient had a recent capsule endoscopy which showed areas of bleeding in the proximal small intestine We will place patient on IV PPI Consult GI   History of coronary artery disease Status post recent stent angioplasty with DES Patient has been on aspirin and Plavix since 05/15/20 We will request  cardiology consultation on recommendation for dual antiplatelet therapy Hold nitrates for now since patient is normotensive  Hypertension Hold all antihypertensive medications for now since patient is normotensive   Diabetes mellitus Patient is on a clear liquid diet Sliding scale coverage for glycemic control   DVT prophylaxis: SCD  Code Status: Full code Family Communication: Greater than 50% of time was spent discussing plan of care with patient.  She verbalizes understanding and agrees with the plan. Disposition Plan: Back to previous home environment Consults called: GI/cardiology    Collier Bullock MD [facility]     07/21/2020, 5:36 PM

## 2020-07-21 NOTE — ED Triage Notes (Signed)
Pt in via POV, sent over via GI doctor due to abnormal Hgb 6.2 to receive blood transfusion.  Pt reports ongoing GI bleed w/ Endoscopy to repair multiple areas which has been unsuccessful.  Also reports dizziness, fatigue, and shortness of breath on exertion.    Pt is on Plavix.  Vitals WDL.  NAD noted at this time.

## 2020-07-21 NOTE — ED Notes (Signed)
Repeat labs sent   Dr Jimmye Norman in with pt and family.   Pt alert.

## 2020-07-21 NOTE — ED Notes (Signed)
1st unit of prbc's infusing.  Pt alert.

## 2020-07-21 NOTE — ED Notes (Signed)
Consent signed for blood.  Pt talking on cell phone.  nsr on monitor.

## 2020-07-21 NOTE — ED Notes (Signed)
Transfusion complete.  No reaction noted.  nsr on monitor.

## 2020-07-21 NOTE — ED Provider Notes (Signed)
ER Provider Note       Time seen: 4:04 PM    I have reviewed the vital signs and the nursing notes.  HISTORY   Chief Complaint Abnormal Lab and GI Bleeding    HPI Joyce Potter is a 74 y.o. female with a history of anemia, aortic stenosis, high hyperlipidemia, hypertension, peptic ulcer disease who presents today for anemia.  Patient was sent from GI for a hemoglobin of 6.2 to receive a blood transfusion.  She reports ongoing GI bleeding with endoscopy.   Past Medical History:  Diagnosis Date  . Anemia    iron deficiency  . Aortic stenosis   . Basal cell carcinoma   . CAD (coronary artery disease)    s/p Left circumflex stent in 2012  . Carotid stenosis   . Cataract   . High cholesterol   . HTN (hypertension)   . Hyperlipidemia   . IDDM (insulin dependent diabetes mellitus)   . Multiple thyroid nodules    Benign  . Peptic ulcer disease   . Retinal artery occlusion    on left    Past Surgical History:  Procedure Laterality Date  . ABDOMINAL HYSTERECTOMY    . ARTERY BIOPSY Left 11/19/2015   Procedure: BIOPSY TEMPORAL ARTERY;  Surgeon: Algernon Huxley, MD;  Location: [facility] ORS;  Service: Vascular;  Laterality: Left;  . BREAST BIOPSY Left 2002   core- neg  . BREAST EXCISIONAL BIOPSY    . CARDIAC CATHETERIZATION N/A 08/08/2015   Procedure: Right and Left Heart Cath and Coronary Angiography;  Surgeon: Teodoro Spray, MD;  Location: [place] [facility];  Service: Cardiovascular;  Laterality: N/A;  . CARDIAC CATHETERIZATION Bilateral 09/03/2016   Procedure: Right/Left Heart Cath and Coronary Angiography;  Surgeon: Teodoro Spray, MD;  Location: [place] [facility];  Service: Cardiovascular;  Laterality: Bilateral;  . CATARACT EXTRACTION W/ INTRAOCULAR LENS  IMPLANT, BILATERAL    . COLONOSCOPY     in 2013- internal hemorrhoids  . COLONOSCOPY WITH PROPOFOL N/A 07/07/2018   Procedure: COLONOSCOPY WITH PROPOFOL;  Surgeon: Manya Silvas, MD;  Location: [facility] ENDOSCOPY;   Service: Endoscopy;  Laterality: N/A;  . COLONOSCOPY WITH PROPOFOL N/A 05/22/2020   Procedure: COLONOSCOPY WITH PROPOFOL;  Surgeon: Jonathon Bellows, MD;  Location: [facility] ENDOSCOPY;  Service: Gastroenterology;  Laterality: N/A;  . CORONARY ANGIOGRAPHY N/A 09/14/2017   Procedure: CORONARY ANGIOGRAPHY;  Surgeon: Teodoro Spray, MD;  Location: [place] [facility];  Service: Cardiovascular;  Laterality: N/A;  . CORONARY ANGIOPLASTY    . CORONARY STENT INTERVENTION N/A 05/15/2020   Procedure: coronary angiography;  Surgeon: Teodoro Spray, MD;  Location: [place] [facility];  Service: Cardiovascular;  Laterality: N/A;  . CORONARY STENT INTERVENTION N/A 05/15/2020   Procedure: CORONARY STENT INTERVENTION;  Surgeon: Isaias Cowman, MD;  Location: [place] [facility];  Service: Cardiovascular;  Laterality: N/A;  . CORONARY STENT PLACEMENT    . ENDARTERECTOMY Left 10/01/2015   Procedure: ENDARTERECTOMY CAROTID;  Surgeon: Algernon Huxley, MD;  Location: [facility] ORS;  Service: Vascular;  Laterality: Left;  . ESOPHAGOGASTRODUODENOSCOPY    . ESOPHAGOGASTRODUODENOSCOPY (EGD) WITH PROPOFOL N/A 05/31/2016   Procedure: ESOPHAGOGASTRODUODENOSCOPY (EGD) WITH PROPOFOL;  Surgeon: Manya Silvas, MD;  Location: [facility] ENDOSCOPY;  Service: Endoscopy;  Laterality: N/A;  . ESOPHAGOGASTRODUODENOSCOPY (EGD) WITH PROPOFOL N/A 07/07/2018   Procedure: ESOPHAGOGASTRODUODENOSCOPY (EGD) WITH PROPOFOL;  Surgeon: Manya Silvas, MD;  Location: [facility] ENDOSCOPY;  Service: Endoscopy;  Laterality: N/A;  . ESOPHAGOGASTRODUODENOSCOPY (EGD) WITH PROPOFOL  N/A 07/17/2018   Procedure: ESOPHAGOGASTRODUODENOSCOPY (EGD) WITH PROPOFOL;  Surgeon: Lucilla Lame, MD;  Location: [facility] ENDOSCOPY;  Service: Endoscopy;  Laterality: N/A;  . ESOPHAGOGASTRODUODENOSCOPY (EGD) WITH PROPOFOL N/A 05/21/2020   Procedure: ESOPHAGOGASTRODUODENOSCOPY (EGD) WITH PROPOFOL;  Surgeon: Jonathon Bellows, MD;  Location: [facility] ENDOSCOPY;  Service: Gastroenterology;  Laterality: N/A;  .  ESOPHAGOGASTRODUODENOSCOPY (EGD) WITH PROPOFOL N/A 06/13/2020   Procedure: ESOPHAGOGASTRODUODENOSCOPY (EGD) WITH PROPOFOL;  Surgeon: Lucilla Lame, MD;  Location: [facility] ENDOSCOPY;  Service: Endoscopy;  Laterality: N/A;  . EYE SURGERY    . GIVENS CAPSULE STUDY N/A 04/17/2013   Procedure: GIVENS CAPSULE STUDY;  Surgeon: Arta Silence, MD;  Location: [facility] ENDOSCOPY;  Service: Endoscopy;  Laterality: N/A;  patient ate breakfast at 7am   . GIVENS CAPSULE STUDY N/A 06/11/2020   Procedure: GIVENS CAPSULE STUDY;  Surgeon: Virgel Manifold, MD;  Location: [facility] ENDOSCOPY;  Service: Endoscopy;  Laterality: N/A;  . HEMORRHOID BANDING  07/27/2016   Dr Nicholes Stairs  . PARTIAL HYSTERECTOMY    . RIGHT AND LEFT HEART CATH Bilateral 09/14/2017   Procedure: RIGHT AND LEFT HEART CATH;  Surgeon: Teodoro Spray, MD;  Location: [place] [facility];  Service: Cardiovascular;  Laterality: Bilateral;  . RIGHT/LEFT HEART CATH AND CORONARY ANGIOGRAPHY Right 05/15/2020   Procedure: Right/Left Heart cath;  Surgeon: Teodoro Spray, MD;  Location: [place] [facility];  Service: Cardiovascular;  Laterality: Right;  . TRIGGER FINGER RELEASE      Allergies Sulfa antibiotics, Aspirin, Invokamet [canagliflozin-metformin hcl], Lovastatin, Penicillins, and Vicodin [hydrocodone-acetaminophen]  Review of Systems Constitutional: Negative for fever. Cardiovascular: Negative for chest pain. Respiratory: Negative for shortness of breath. Gastrointestinal: Negative for abdominal pain, vomiting and diarrhea.  Positive for GI bleeding Musculoskeletal: Negative for back pain. Skin: Negative for rash. Neurological: Negative for headaches, focal weakness or numbness.  All systems negative/normal/unremarkable except as stated in the HPI  ____________________________________________   PHYSICAL EXAM:  VITAL SIGNS: Vitals:   07/21/20 1529 07/21/20 1530  BP: (!) 149/55   Pulse: 79 76  Resp: 15 16  Temp:    SpO2: 100% 100%     Constitutional: Alert and oriented. Well appearing and in no distress. Eyes: Conjunctivae are normal. Normal extraocular movements. Cardiovascular: Normal rate, regular rhythm. No murmurs, rubs, or gallops. Respiratory: Normal respiratory effort without tachypnea nor retractions. Breath sounds are clear and equal bilaterally. No wheezes/rales/rhonchi. Gastrointestinal: Soft and nontender. Normal bowel sounds Musculoskeletal: Nontender with normal range of motion in extremities. No lower extremity tenderness nor edema. Neurologic:  Normal speech and language. No gross focal neurologic deficits are appreciated.  Skin:  Skin is warm, dry and intact. No rash noted. Psychiatric: Speech and behavior are normal.  ____________________________________________   LABS (pertinent positives/negatives)  Labs Reviewed  SARS CORONAVIRUS 2 BY RT PCR ([facility])  HEMOGLOBIN AND HEMATOCRIT, BLOOD  COMPREHENSIVE METABOLIC PANEL  POC OCCULT BLOOD, ED  TYPE AND SCREEN  PREPARE RBC (CROSSMATCH)   DIFFERENTIAL DIAGNOSIS  Anemia, gastrointestinal bleeding, dehydration, electrolyte abnormality  ASSESSMENT AND PLAN  Anemia, gastrointestinal bleeding   Plan: The patient had presented for GI bleeding and for a blood transfusion. Patient's labs did indicate a hemoglobin of 6.2 for which I have ordered 1 unit of packed red blood cells.  Impression from GI is that she needs to be admitted for observation and cardiology consultation to determine any ongoing need for Plavix.  Lenise Arena MD    Note: This note was generated in part or whole  with voice recognition software. Voice recognition is usually quite accurate but there are transcription errors that can and very often do occur. I apologize for any typographical errors that were not detected and corrected.     Earleen Newport, MD 07/21/20 [phone]

## 2020-07-21 NOTE — ED Notes (Signed)
Unable to get blood at this time, since pt receiving blood transfusion.

## 2020-07-22 DIAGNOSIS — Z880 Allergy status to penicillin: Secondary | ICD-10-CM | POA: Diagnosis not present

## 2020-07-22 DIAGNOSIS — E7800 Pure hypercholesterolemia, unspecified: Secondary | ICD-10-CM | POA: Diagnosis not present

## 2020-07-22 DIAGNOSIS — D62 Acute posthemorrhagic anemia: Principal | ICD-10-CM

## 2020-07-22 DIAGNOSIS — E1165 Type 2 diabetes mellitus with hyperglycemia: Secondary | ICD-10-CM | POA: Diagnosis not present

## 2020-07-22 DIAGNOSIS — E119 Type 2 diabetes mellitus without complications: Secondary | ICD-10-CM | POA: Diagnosis not present

## 2020-07-22 DIAGNOSIS — D649 Anemia, unspecified: Secondary | ICD-10-CM | POA: Diagnosis not present

## 2020-07-22 DIAGNOSIS — E669 Obesity, unspecified: Secondary | ICD-10-CM | POA: Diagnosis not present

## 2020-07-22 DIAGNOSIS — I2510 Atherosclerotic heart disease of native coronary artery without angina pectoris: Secondary | ICD-10-CM | POA: Diagnosis not present

## 2020-07-22 DIAGNOSIS — Z79899 Other long term (current) drug therapy: Secondary | ICD-10-CM | POA: Diagnosis not present

## 2020-07-22 DIAGNOSIS — Z825 Family history of asthma and other chronic lower respiratory diseases: Secondary | ICD-10-CM | POA: Diagnosis not present

## 2020-07-22 DIAGNOSIS — Z803 Family history of malignant neoplasm of breast: Secondary | ICD-10-CM | POA: Diagnosis not present

## 2020-07-22 DIAGNOSIS — Z7902 Long term (current) use of antithrombotics/antiplatelets: Secondary | ICD-10-CM | POA: Diagnosis not present

## 2020-07-22 DIAGNOSIS — K31819 Angiodysplasia of stomach and duodenum without bleeding: Secondary | ICD-10-CM | POA: Diagnosis not present

## 2020-07-22 DIAGNOSIS — Z7982 Long term (current) use of aspirin: Secondary | ICD-10-CM | POA: Diagnosis not present

## 2020-07-22 DIAGNOSIS — Z9841 Cataract extraction status, right eye: Secondary | ICD-10-CM | POA: Diagnosis not present

## 2020-07-22 DIAGNOSIS — Z20822 Contact with and (suspected) exposure to covid-19: Secondary | ICD-10-CM | POA: Diagnosis not present

## 2020-07-22 DIAGNOSIS — Z888 Allergy status to other drugs, medicaments and biological substances status: Secondary | ICD-10-CM | POA: Diagnosis not present

## 2020-07-22 DIAGNOSIS — K5521 Angiodysplasia of colon with hemorrhage: Secondary | ICD-10-CM | POA: Diagnosis not present

## 2020-07-22 DIAGNOSIS — I350 Nonrheumatic aortic (valve) stenosis: Secondary | ICD-10-CM | POA: Diagnosis not present

## 2020-07-22 DIAGNOSIS — Z823 Family history of stroke: Secondary | ICD-10-CM | POA: Diagnosis not present

## 2020-07-22 DIAGNOSIS — I10 Essential (primary) hypertension: Secondary | ICD-10-CM | POA: Diagnosis not present

## 2020-07-22 DIAGNOSIS — Z833 Family history of diabetes mellitus: Secondary | ICD-10-CM | POA: Diagnosis not present

## 2020-07-22 DIAGNOSIS — Z794 Long term (current) use of insulin: Secondary | ICD-10-CM | POA: Diagnosis not present

## 2020-07-22 DIAGNOSIS — K921 Melena: Secondary | ICD-10-CM | POA: Diagnosis not present

## 2020-07-22 DIAGNOSIS — Z882 Allergy status to sulfonamides status: Secondary | ICD-10-CM | POA: Diagnosis not present

## 2020-07-22 DIAGNOSIS — Z6833 Body mass index (BMI) 33.0-33.9, adult: Secondary | ICD-10-CM | POA: Diagnosis not present

## 2020-07-22 DIAGNOSIS — E785 Hyperlipidemia, unspecified: Secondary | ICD-10-CM | POA: Diagnosis not present

## 2020-07-22 LAB — BASIC METABOLIC PANEL
Anion gap: 6 (ref 5–15)
BUN: 17 mg/dL (ref 8–23)
CO2: 27 mmol/L (ref 22–32)
Calcium: 8.5 mg/dL — ABNORMAL LOW (ref 8.9–10.3)
Chloride: 105 mmol/L (ref 98–111)
Creatinine, Ser: 0.87 mg/dL (ref 0.44–1.00)
GFR calc Af Amer: >60 mL/min (ref >60)
GFR calc non Af Amer: >60 mL/min (ref >60)
Glucose, Bld: 106 mg/dL — ABNORMAL HIGH (ref 70–99)
Potassium: 4 mmol/L (ref 3.5–5.1)
Sodium: 138 mmol/L (ref 135–145)

## 2020-07-22 LAB — CBC
HCT: 22 % — ABNORMAL LOW (ref 36.0–46.0)
Hemoglobin: 6.6 g/dL — ABNORMAL LOW (ref 12.0–15.0)
MCH: 24.8 pg — ABNORMAL LOW (ref 26.0–34.0)
MCHC: 30 g/dL (ref 30.0–36.0)
MCV: 82.7 fL (ref 80.0–100.0)
Platelets: 297 10*3/uL (ref 150–400)
RBC: 2.66 MIL/uL — ABNORMAL LOW (ref 3.87–5.11)
RDW: 17.6 % — ABNORMAL HIGH (ref 11.5–15.5)
WBC: 4.7 10*3/uL (ref 4.0–10.5)
nRBC: 0 % (ref 0.0–0.2)

## 2020-07-22 LAB — PREPARE RBC (CROSSMATCH)

## 2020-07-22 LAB — HEMOGLOBIN AND HEMATOCRIT, BLOOD
HCT: 26.6 % — ABNORMAL LOW (ref 36.0–46.0)
HCT: 29.1 % — ABNORMAL LOW (ref 36.0–46.0)
Hemoglobin: 8.2 g/dL — ABNORMAL LOW (ref 12.0–15.0)
Hemoglobin: 9 g/dL — ABNORMAL LOW (ref 12.0–15.0)

## 2020-07-22 MED ORDER — SODIUM CHLORIDE 0.9% IV SOLUTION
Freq: Once | INTRAVENOUS | Status: DC
  Filled 2020-07-22: qty 250

## 2020-07-22 MED ORDER — PANTOPRAZOLE SODIUM 40 MG IV SOLR
40.0000 mg | Freq: Two times a day (BID) | INTRAVENOUS | Status: DC
  Administered 2020-07-22 – 2020-07-24 (×5): 40 mg via INTRAVENOUS
  Filled 2020-07-22 (×5): qty 40

## 2020-07-22 NOTE — ED Notes (Addendum)
Called Joyce Potter again in blood bank who states he still cannot see order to transfuse second unit of blood. Will place order again if needed as this RN can see the order on her end. Will contact attending provider if needed to confirm. Will send hgb/hct repeat soon.

## 2020-07-22 NOTE — ED Notes (Signed)
Recent hypotension noted on monitor at 76/52; pt and BP cuff assessed. Pt A&Ox4; denies dizziness or nausea; resp reg/unlabored; pt sitting calmly in bed. BP repeated; currently 103/86.

## 2020-07-22 NOTE — ED Notes (Signed)
This RN to blood bank. 2nd unit not yet ready as order did not transfer over for blood bank staff to see.

## 2020-07-22 NOTE — ED Notes (Signed)
Pt denies any HF; previous RN Karena Addison denies any reason pt cannot be inc to 240cc/hr on her blood. Increased blood rate to 240cc/hr.

## 2020-07-22 NOTE — Plan of Care (Signed)

## 2020-07-22 NOTE — Consult Note (Addendum)
[facility]  CARDIOLOGY CONSULT NOTE  Patient ID: Joyce Potter MRN: [id number] DOB/AGE: Mar 20, 1946 74 y.o.  Admit date: 07/21/2020 Referring Physician Manuella Ghazi Primary Physician Lamonte Sakai Primary Cardiologist Fath Reason for Consultation coronary artery disease  HPI: 74 year old female referred for evaluation of antiplatelet management during GI bleed.  The patient is status post drug-eluting stent proximal RCA 05/15/2020.  She was recently hospitalized 06/13/2020 with GI bleed.  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  Low-dose aspirin was restarted prior to discharge.  Lasix was restarted as an outpatient.  He presents on 07/21/2020 persistent dark stools and marked anemia with hemoglobin accurate of 6.2 and 21.1, respectively.  Patient was transfused with 2 units of blood.  The patient denies exertional chest pain.  She does report palpitations and heart pounding at night and with exertion.  2D echocardiogram 05/03/2020 revealed normal left ventricular function, with LVEF greater than 55%, calculated aortic valve area 0.98 cm, peak velocity 3.54 m/s, mean gradient 24.2 mmHg and peak gradient 50.4 mmHg, consistent with moderate aortic stenosis.  Review of systems complete and found to be negative unless listed above     Past Medical History:  Diagnosis Date  . Anemia    iron deficiency  . Aortic stenosis   . Basal cell carcinoma   . CAD (coronary artery disease)    s/p Left circumflex stent in 2012  . Carotid stenosis   . Cataract   . High cholesterol   . HTN (hypertension)   . Hyperlipidemia   . IDDM (insulin dependent diabetes mellitus)   . Multiple thyroid nodules    Benign  . Peptic ulcer disease   . Retinal artery occlusion    on left    Past Surgical History:  Procedure Laterality Date  . ABDOMINAL HYSTERECTOMY    . ARTERY BIOPSY Left 11/19/2015   Procedure: BIOPSY TEMPORAL ARTERY;  Surgeon: Algernon Huxley, MD;  Location: [facility] ORS;   Service: Vascular;  Laterality: Left;  . BREAST BIOPSY Left 2002   core- neg  . BREAST EXCISIONAL BIOPSY    . CARDIAC CATHETERIZATION N/A 08/08/2015   Procedure: Right and Left Heart Cath and Coronary Angiography;  Surgeon: Teodoro Spray, MD;  Location: [place] [facility];  Service: Cardiovascular;  Laterality: N/A;  . CARDIAC CATHETERIZATION Bilateral 09/03/2016   Procedure: Right/Left Heart Cath and Coronary Angiography;  Surgeon: Teodoro Spray, MD;  Location: [place] [facility];  Service: Cardiovascular;  Laterality: Bilateral;  . CATARACT EXTRACTION W/ INTRAOCULAR LENS  IMPLANT, BILATERAL    . COLONOSCOPY     in 2013- internal hemorrhoids  . COLONOSCOPY WITH PROPOFOL N/A 07/07/2018   Procedure: COLONOSCOPY WITH PROPOFOL;  Surgeon: Manya Silvas, MD;  Location: [facility] ENDOSCOPY;  Service: Endoscopy;  Laterality: N/A;  . COLONOSCOPY WITH PROPOFOL N/A 05/22/2020   Procedure: COLONOSCOPY WITH PROPOFOL;  Surgeon: Jonathon Bellows, MD;  Location: [facility] ENDOSCOPY;  Service: Gastroenterology;  Laterality: N/A;  . CORONARY ANGIOGRAPHY N/A 09/14/2017   Procedure: CORONARY ANGIOGRAPHY;  Surgeon: Teodoro Spray, MD;  Location: [place] [facility];  Service: Cardiovascular;  Laterality: N/A;  . CORONARY ANGIOPLASTY    . CORONARY STENT INTERVENTION N/A 05/15/2020   Procedure: coronary angiography;  Surgeon: Teodoro Spray, MD;  Location: [place] [facility];  Service: Cardiovascular;  Laterality: N/A;  . CORONARY STENT INTERVENTION N/A 05/15/2020   Procedure: CORONARY STENT INTERVENTION;  Surgeon: Isaias Cowman, MD;  Location: [place] [facility];  Service:  Cardiovascular;  Laterality: N/A;  . CORONARY STENT PLACEMENT    . ENDARTERECTOMY Left 10/01/2015   Procedure: ENDARTERECTOMY CAROTID;  Surgeon: Algernon Huxley, MD;  Location: [facility] ORS;  Service: Vascular;  Laterality: Left;  . ESOPHAGOGASTRODUODENOSCOPY    . ESOPHAGOGASTRODUODENOSCOPY (EGD) WITH PROPOFOL N/A 05/31/2016   Procedure:  ESOPHAGOGASTRODUODENOSCOPY (EGD) WITH PROPOFOL;  Surgeon: Manya Silvas, MD;  Location: [facility] ENDOSCOPY;  Service: Endoscopy;  Laterality: N/A;  . ESOPHAGOGASTRODUODENOSCOPY (EGD) WITH PROPOFOL N/A 07/07/2018   Procedure: ESOPHAGOGASTRODUODENOSCOPY (EGD) WITH PROPOFOL;  Surgeon: Manya Silvas, MD;  Location: [facility] ENDOSCOPY;  Service: Endoscopy;  Laterality: N/A;  . ESOPHAGOGASTRODUODENOSCOPY (EGD) WITH PROPOFOL N/A 07/17/2018   Procedure: ESOPHAGOGASTRODUODENOSCOPY (EGD) WITH PROPOFOL;  Surgeon: Lucilla Lame, MD;  Location: [facility] ENDOSCOPY;  Service: Endoscopy;  Laterality: N/A;  . ESOPHAGOGASTRODUODENOSCOPY (EGD) WITH PROPOFOL N/A 05/21/2020   Procedure: ESOPHAGOGASTRODUODENOSCOPY (EGD) WITH PROPOFOL;  Surgeon: Jonathon Bellows, MD;  Location: [facility] ENDOSCOPY;  Service: Gastroenterology;  Laterality: N/A;  . ESOPHAGOGASTRODUODENOSCOPY (EGD) WITH PROPOFOL N/A 06/13/2020   Procedure: ESOPHAGOGASTRODUODENOSCOPY (EGD) WITH PROPOFOL;  Surgeon: Lucilla Lame, MD;  Location: [facility] ENDOSCOPY;  Service: Endoscopy;  Laterality: N/A;  . EYE SURGERY    . GIVENS CAPSULE STUDY N/A 04/17/2013   Procedure: GIVENS CAPSULE STUDY;  Surgeon: Arta Silence, MD;  Location: [facility] ENDOSCOPY;  Service: Endoscopy;  Laterality: N/A;  patient ate breakfast at 7am   . GIVENS CAPSULE STUDY N/A 06/11/2020   Procedure: GIVENS CAPSULE STUDY;  Surgeon: Virgel Manifold, MD;  Location: [facility] ENDOSCOPY;  Service: Endoscopy;  Laterality: N/A;  . HEMORRHOID BANDING  07/27/2016   Dr Nicholes Stairs  . PARTIAL HYSTERECTOMY    . RIGHT AND LEFT HEART CATH Bilateral 09/14/2017   Procedure: RIGHT AND LEFT HEART CATH;  Surgeon: Teodoro Spray, MD;  Location: [place] [facility];  Service: Cardiovascular;  Laterality: Bilateral;  . RIGHT/LEFT HEART CATH AND CORONARY ANGIOGRAPHY Right 05/15/2020   Procedure: Right/Left Heart cath;  Surgeon: Teodoro Spray, MD;  Location: [place] [facility];  Service: Cardiovascular;  Laterality: Right;  . TRIGGER  FINGER RELEASE      (Not in a hospital admission)  Social History   Socioeconomic History  . Marital status: Married    Spouse name: [name]  . Number of children: Not on file  . Years of education: Not on file  . Highest education level: Not on file  Occupational History  . Occupation: retired   Tobacco Use  . Smoking status: Former Smoker    Quit date: 02/15/1985    Years since quitting: 35.4  . Smokeless tobacco: Never Used  . Tobacco comment: quit about 31 years ago  Vaping Use  . Vaping Use: Never used  Substance and Sexual Activity  . Alcohol use: No    Alcohol/week: 0.0 standard drinks  . Drug use: No  . Sexual activity: Not on file  Other Topics Concern  . Not on file  Social History Narrative   Lives at home independently.   Social Determinants of Health   Financial Resource Strain:   . Difficulty of Paying Living Expenses:   Food Insecurity: No Food Insecurity  . Worried About Charity fundraiser in the Last Year: Never true  . Ran Out of Food in the Last Year: Never true  Transportation Needs: No Transportation Needs  . Lack of Transportation (Medical): No  . Lack of Transportation (Non-Medical): No  Physical Activity:   . Days of Exercise per Week:   . Minutes of Exercise  per Session:   Stress:   . Feeling of Stress :   Social Connections:   . Frequency of Communication with Friends and Family:   . Frequency of Social Gatherings with Friends and Family:   . Attends Religious Services:   . Active Member of Clubs or Organizations:   . Attends Archivist Meetings:   Marland Kitchen Marital Status:   Intimate Partner Violence:   . Fear of Current or Ex-Partner:   . Emotionally Abused:   Marland Kitchen Physically Abused:   . Sexually Abused:     Family History  Problem Relation Age of Onset  . Uterine cancer Mother   . Breast cancer Mother 40  . Seizures Father   . Stroke Father   . Diabetes Father   . COPD Father   . Colon cancer Neg Hx       Review of  systems complete and found to be negative unless listed above      PHYSICAL EXAM  General: Well developed, well nourished, in no acute distress HEENT:  Normocephalic and atramatic Neck:  No JVD.  Lungs: Clear bilaterally to auscultation and percussion. Heart: HRRR . Normal S1 and S2 without gallops or murmurs.  Abdomen: Bowel sounds are positive, abdomen soft and non-tender  Msk:  Back normal, normal gait. Normal strength and tone for age. Extremities: No clubbing, cyanosis or edema.   Neuro: Alert and oriented X 3. Psych:  Good affect, responds appropriately  Labs:   Lab Results  Component Value Date   WBC 4.7 07/22/2020   HGB 6.6 (L) 07/22/2020   HCT 22.0 (L) 07/22/2020   MCV 82.7 07/22/2020   PLT 297 07/22/2020    Recent Labs  Lab 07/21/20 1011 07/21/20 1011 07/22/20 0728  NA 139   < > 138  K 3.8   < > 4.0  CL 105   < > 105  CO2 25   < > 27  BUN 23   < > 17  CREATININE 0.90   < > 0.87  CALCIUM 8.4*   < > 8.5*  PROT 7.0  --   --   BILITOT 0.6  --   --   ALKPHOS 72  --   --   ALT 14  --   --   AST 18  --   --   GLUCOSE 146*   < > 106*   < > = values in this interval not displayed.   Lab Results  Component Value Date   CKTOTAL 101 01/10/2013   CKMB 0.6 01/10/2013   TROPONINI <0.03 01/27/2019    Lab Results  Component Value Date   CHOL 130 05/21/2020   CHOL 188 09/29/2015   Lab Results  Component Value Date   HDL 35 (L) 05/21/2020   HDL 41 09/29/2015   Lab Results  Component Value Date   LDLCALC 79 05/21/2020   LDLCALC 131 (H) 09/29/2015   Lab Results  Component Value Date   TRIG 81 05/21/2020   TRIG 81 09/29/2015   Lab Results  Component Value Date   CHOLHDL 3.7 05/21/2020   CHOLHDL 4.6 09/29/2015   No results found for: LDLDIRECT    Radiology: No results found.  EKG: Pending  ASSESSMENT AND PLAN:   1.  Coronary artery disease, status post drug-eluting stent proximal RCA 05/15/2020, on aspirin and clopidogrel, being held for  persistent GI bleed, patient at risk for in-stent thrombosis 2.  Moderate aortic stenosis, calculated aortic valve area 0.98 cm, mean  gradient 24.2 mmHg, minimally symptomatic 3.  GI bleed, secondary to duodenal AVMs, status post APC 06/14/2020, markedly anemic with initial hemoglobin hematocrit of 6.2 and 21.1, respectively  Recommendations  1.  Agree with current therapy 2.  Resume low-dose aspirin and clopidogrel as soon as possible after stabilization of GI bleed 3.  Defer further cardiac diagnostics at this time  Signed: Isaias Cowman MD,PhD, [facility] 07/22/2020, 8:33 AM

## 2020-07-22 NOTE — ED Notes (Signed)
Pt sleeping. 

## 2020-07-22 NOTE — Consult Note (Signed)
[name], MD [address] [place], [place], [place], [id number], [place], [place], [place], [address] Phone: [phone]  Fax: [phone]  Consultation  Referring Provider:     Dr. Manuella Ghazi Primary Care Physician:  [name], MD Reason for Consultation:     Anemia, melena  Date of Admission:  07/21/2020 Date of Consultation:  07/22/2020         HPI:   Joyce Potter is a 74 y.o. female with aspirin and Plavix at home, presents with melena and weakness for 1 week.  Has not had a bowel movement in the last 2 days, but had melena prior to that, starting a week ago.  Did have a bowel movement last night and this morning that were melanotic as well.  No hematemesis.  No emesis.  Weakness is improved post PRBC transfusion in the hospital.  Last Plavix use was yesterday morning  Patient recently had a small bowel capsule study that showed active bleeding in the small bowel.  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  Past Medical History:  Diagnosis Date  . Anemia    iron deficiency  . Aortic stenosis   . Basal cell carcinoma   . CAD (coronary artery disease)    s/p Left circumflex stent in 2012  . Carotid stenosis   . Cataract   . High cholesterol   . HTN (hypertension)   . Hyperlipidemia   . IDDM (insulin dependent diabetes mellitus)   . Multiple thyroid nodules    Benign  . Peptic ulcer disease   . Retinal artery occlusion    on left    Past Surgical History:  Procedure Laterality Date  . ABDOMINAL HYSTERECTOMY    . ARTERY BIOPSY Left 11/19/2015   Procedure: BIOPSY TEMPORAL ARTERY;  Surgeon: Algernon Huxley, MD;  Location: [facility] ORS;  Service: Vascular;  Laterality: Left;  . BREAST BIOPSY Left 2002   core- neg  . BREAST EXCISIONAL BIOPSY    . CARDIAC CATHETERIZATION N/A 08/08/2015   Procedure: Right and Left Heart Cath and Coronary Angiography;  Surgeon: Teodoro Spray,  MD;  Location: [place] [facility];  Service: Cardiovascular;  Laterality: N/A;  . CARDIAC CATHETERIZATION Bilateral 09/03/2016   Procedure: Right/Left Heart Cath and Coronary Angiography;  Surgeon: Teodoro Spray, MD;  Location: [place] [facility];  Service: Cardiovascular;  Laterality: Bilateral;  . CATARACT EXTRACTION W/ INTRAOCULAR LENS  IMPLANT, BILATERAL    . COLONOSCOPY     in 2013- internal hemorrhoids  . COLONOSCOPY WITH PROPOFOL N/A 07/07/2018   Procedure: COLONOSCOPY WITH PROPOFOL;  Surgeon: Manya Silvas, MD;  Location: [facility] ENDOSCOPY;  Service: Endoscopy;  Laterality: N/A;  . COLONOSCOPY WITH PROPOFOL N/A 05/22/2020   Procedure: COLONOSCOPY WITH PROPOFOL;  Surgeon: Jonathon Bellows, MD;  Location: [facility] ENDOSCOPY;  Service: Gastroenterology;  Laterality: N/A;  . CORONARY ANGIOGRAPHY N/A 09/14/2017   Procedure: CORONARY ANGIOGRAPHY;  Surgeon: Teodoro Spray, MD;  Location: [place] [facility];  Service: Cardiovascular;  Laterality: N/A;  . CORONARY ANGIOPLASTY    . CORONARY STENT INTERVENTION N/A 05/15/2020   Procedure: coronary angiography;  Surgeon: Teodoro Spray, MD;  Location: [place] [facility];  Service: Cardiovascular;  Laterality: N/A;  . CORONARY STENT INTERVENTION N/A 05/15/2020   Procedure: CORONARY STENT INTERVENTION;  Surgeon: Isaias Cowman, MD;  Location: [place] [facility];  Service: Cardiovascular;  Laterality: N/A;  . CORONARY  STENT PLACEMENT    . ENDARTERECTOMY Left 10/01/2015   Procedure: ENDARTERECTOMY CAROTID;  Surgeon: Algernon Huxley, MD;  Location: [facility] ORS;  Service: Vascular;  Laterality: Left;  . ESOPHAGOGASTRODUODENOSCOPY    . ESOPHAGOGASTRODUODENOSCOPY (EGD) WITH PROPOFOL N/A 05/31/2016   Procedure: ESOPHAGOGASTRODUODENOSCOPY (EGD) WITH PROPOFOL;  Surgeon: Manya Silvas, MD;  Location: [facility] ENDOSCOPY;  Service: Endoscopy;  Laterality: N/A;  . ESOPHAGOGASTRODUODENOSCOPY (EGD) WITH PROPOFOL N/A 07/07/2018   Procedure: ESOPHAGOGASTRODUODENOSCOPY (EGD)  WITH PROPOFOL;  Surgeon: Manya Silvas, MD;  Location: [facility] ENDOSCOPY;  Service: Endoscopy;  Laterality: N/A;  . ESOPHAGOGASTRODUODENOSCOPY (EGD) WITH PROPOFOL N/A 07/17/2018   Procedure: ESOPHAGOGASTRODUODENOSCOPY (EGD) WITH PROPOFOL;  Surgeon: Lucilla Lame, MD;  Location: [facility] ENDOSCOPY;  Service: Endoscopy;  Laterality: N/A;  . ESOPHAGOGASTRODUODENOSCOPY (EGD) WITH PROPOFOL N/A 05/21/2020   Procedure: ESOPHAGOGASTRODUODENOSCOPY (EGD) WITH PROPOFOL;  Surgeon: Jonathon Bellows, MD;  Location: [facility] ENDOSCOPY;  Service: Gastroenterology;  Laterality: N/A;  . ESOPHAGOGASTRODUODENOSCOPY (EGD) WITH PROPOFOL N/A 06/13/2020   Procedure: ESOPHAGOGASTRODUODENOSCOPY (EGD) WITH PROPOFOL;  Surgeon: Lucilla Lame, MD;  Location: [facility] ENDOSCOPY;  Service: Endoscopy;  Laterality: N/A;  . EYE SURGERY    . GIVENS CAPSULE STUDY N/A 04/17/2013   Procedure: GIVENS CAPSULE STUDY;  Surgeon: Arta Silence, MD;  Location: [facility] ENDOSCOPY;  Service: Endoscopy;  Laterality: N/A;  patient ate breakfast at 7am   . GIVENS CAPSULE STUDY N/A 06/11/2020   Procedure: GIVENS CAPSULE STUDY;  Surgeon: Virgel Manifold, MD;  Location: [facility] ENDOSCOPY;  Service: Endoscopy;  Laterality: N/A;  . HEMORRHOID BANDING  07/27/2016   Dr Nicholes Stairs  . PARTIAL HYSTERECTOMY    . RIGHT AND LEFT HEART CATH Bilateral 09/14/2017   Procedure: RIGHT AND LEFT HEART CATH;  Surgeon: Teodoro Spray, MD;  Location: [place] [facility];  Service: Cardiovascular;  Laterality: Bilateral;  . RIGHT/LEFT HEART CATH AND CORONARY ANGIOGRAPHY Right 05/15/2020   Procedure: Right/Left Heart cath;  Surgeon: Teodoro Spray, MD;  Location: [place] [facility];  Service: Cardiovascular;  Laterality: Right;  . TRIGGER FINGER RELEASE      Prior to Admission medications   Medication Sig Start Date End Date Taking? Authorizing Provider  aspirin EC 81 MG EC tablet Take 1 tablet (81 mg total) by mouth daily. Swallow whole. 06/16/20  Yes [name], MD  clopidogrel  (PLAVIX) 75 MG tablet Take 1 tablet (75 mg total) by mouth daily with breakfast. 05/16/20  Yes [name], [name], MD  gabapentin (NEURONTIN) 300 MG capsule Take 300 mg by mouth 2 (two) times daily.  04/30/19  Yes [provider]  insulin NPH-regular Human (NOVOLIN 70/30) (70-30) 100 UNIT/ML injection Inject 30 Units into the skin 2 (two) times daily with a meal.    Yes [provider]  isosorbide mononitrate (IMDUR) 30 MG 24 hr tablet Take 30 mg by mouth daily.    Yes [provider]  lisinopril (ZESTRIL) 5 MG tablet Take 5 mg by mouth daily.  12/18/19  Yes [provider]  metoprolol tartrate (LOPRESSOR) 100 MG tablet Take 150 mg by mouth 2 (two) times daily.    Yes [provider]  pantoprazole (PROTONIX) 40 MG tablet Take 40 mg by mouth daily. 05/17/20  Yes [provider]  pravastatin (PRAVACHOL) 20 MG tablet Take 20 mg by mouth at bedtime. 05/01/20  Yes [provider]  saccharomyces boulardii (FLORASTOR) 250 MG capsule Take 250 mg by mouth 2 (two) times daily.    Yes [provider]  SPIRIVA HANDIHALER 18 MCG inhalation  capsule Place 18 mcg into inhaler and inhale daily in the afternoon.  06/02/20  Yes [provider]  albuterol (PROAIR HFA) 108 (90 Base) MCG/ACT inhaler Inhale 2 puffs into the lungs every 4 (four) hours as needed for wheezing or shortness of breath.     [provider]  alendronate (FOSAMAX) 70 MG tablet Take 70 mg by mouth once a week.     [provider]  amitriptyline (ELAVIL) 25 MG tablet Take 25 mg by mouth at bedtime as needed for sleep.     [provider]  traMADol (ULTRAM) 50 MG tablet Take 50 mg by mouth every 6 (six) hours as needed for moderate pain.     [provider]  TRULICITY 1.5 OE/4.2PN SOPN Inject 0.5 mLs into the skin once a week. 03/21/20   [provider]    Family History  Problem Relation Age of Onset  . Uterine cancer Mother   .  Breast cancer Mother 13  . Seizures Father   . Stroke Father   . Diabetes Father   . COPD Father   . Colon cancer Neg Hx      Social History   Tobacco Use  . Smoking status: Former Smoker    Quit date: 02/15/1985    Years since quitting: 35.4  . Smokeless tobacco: Never Used  . Tobacco comment: quit about 31 years ago  Vaping Use  . Vaping Use: Never used  Substance Use Topics  . Alcohol use: No    Alcohol/week: 0.0 standard drinks  . Drug use: No    Allergies as of 07/21/2020 - Review Complete 07/21/2020  Allergen Reaction Noted  . Sulfa antibiotics Other (See Comments) 04/13/2013  . Aspirin Other (See Comments) 09/28/2015  . Invokamet [canagliflozin-metformin hcl] Other (See Comments) 08/08/2015  . Lovastatin Rash 08/08/2015  . Penicillins Rash 04/13/2013  . Vicodin [hydrocodone-acetaminophen] Rash 04/13/2013    Review of Systems:    All systems reviewed and negative except where noted in HPI.   Physical Exam:  Vital signs in last 24 hours: Vitals:   07/22/20 0800 07/22/20 0944 07/22/20 1000 07/22/20 1042  BP: (!) 133/57 (!) 136/52 (!) 141/57 124/80  Pulse: 78 83 81 85  Resp: 14 16 16 12   Temp:  98.4 F (36.9 C)    TempSrc:  Oral    SpO2: 98%  100% 97%  Weight:      Height:         General:   Pleasant, cooperative in NAD Head:  Normocephalic and atraumatic. Eyes:   No icterus.   Conjunctiva pink. PERRLA. Ears:  Normal auditory acuity. Neck:  Supple; no masses or thyroidomegaly Lungs: Respirations even and unlabored. Lungs clear to auscultation bilaterally.   No wheezes, crackles, or rhonchi.  Abdomen:  Soft, nondistended, nontender. Normal bowel sounds. No appreciable masses or hepatomegaly.  No rebound or guarding.  Neurologic:  Alert and oriented x3;  grossly normal neurologically. Skin:  Intact without significant lesions or rashes. Cervical Nodes:  No significant cervical adenopathy. Psych:  Alert and cooperative. Normal affect.  LAB  RESULTS: Recent Labs    07/21/20 1011 07/21/20 2114 07/22/20 0728  WBC 6.0  --  4.7  HGB 6.2* 6.7* 6.6*  HCT 21.1* 22.9* 22.0*  PLT 337  --  297   BMET Recent Labs    07/21/20 1011 07/22/20 0728  NA 139 138  K 3.8 4.0  CL 105 105  CO2 25 27  GLUCOSE 146* 106*  BUN  23 17  CREATININE 0.90 0.87  CALCIUM 8.4* 8.5*   LFT Recent Labs    07/21/20 1011  PROT 7.0  ALBUMIN 3.8  AST 18  ALT 14  ALKPHOS 72  BILITOT 0.6   PT/INR No results for input(s): LABPROT, INR in the last 72 hours.  STUDIES: No results found.    Impression / Plan:   JANAKI EXLEY is a 74 y.o. y/o female with history of AVMs, with active bleeding, presents with melena, acute anemia  Patient will need repeat upper endoscopy due to ongoing anemia and melena  However, Plavix will need to be held for 3 to 5 days before the procedure  Plan on upper endoscopy/push enteroscopy on Thursday as that would be day three of Plavix  PPI IV twice daily  Continue serial CBCs and transfuse PRN Avoid NSAIDs Maintain 2 large-bore IV lines Please page GI with any acute hemodynamic changes, or signs of active GI bleeding  If patient continues to have recurrent bleeding and hemoglobin does not stabilize prior to that 3 days off Plavix, patient may need RBC scan or sooner endoscopy.  Patient will need to be reevaluated if this occurs  Thank you for involving me in the care of this patient.      LOS: 0 days   Virgel Manifold, MD  07/22/2020, 11:13 AM

## 2020-07-22 NOTE — ED Notes (Signed)
Repeated hgb/hct collected and sent to lab during system downtime.

## 2020-07-22 NOTE — ED Notes (Signed)
See nursing notes in pt's paper chart as blood started at this time during system down time.

## 2020-07-22 NOTE — ED Notes (Signed)
Pt up to bathroom with assistance. Pt waiting on admission bed.

## 2020-07-22 NOTE — Progress Notes (Signed)
1        [place] [facility] [place] NAME: Joyce Potter    MR#:  [id number]  DATE OF BIRTH:  Apr 21, 1946  SUBJECTIVE:  CHIEF COMPLAINT:   Chief Complaint  Patient presents with  . Abnormal Lab  . GI Bleeding  weak and tired, hemoglobin 6.6 this morning REVIEW OF SYSTEMS:  Review of Systems  Constitutional: Negative for diaphoresis, fever, malaise/fatigue and weight loss.  HENT: Negative for ear discharge, ear pain, hearing loss, nosebleeds, sore throat and tinnitus.   Eyes: Negative for blurred vision and pain.  Respiratory: Negative for cough, hemoptysis, shortness of breath and wheezing.   Cardiovascular: Negative for chest pain, palpitations, orthopnea and leg swelling.  Gastrointestinal: Positive for melena. Negative for abdominal pain, blood in stool, constipation, diarrhea, heartburn, nausea and vomiting.  Genitourinary: Negative for dysuria, frequency and urgency.  Musculoskeletal: Negative for back pain and myalgias.  Skin: Negative for itching and rash.  Neurological: Negative for dizziness, tingling, tremors, focal weakness, seizures, weakness and headaches.  Psychiatric/Behavioral: Negative for depression. The patient is not nervous/anxious.    DRUG ALLERGIES:   Allergies  Allergen Reactions  . Sulfa Antibiotics Other (See Comments)    Altered Mental Status  . Aspirin Other (See Comments)    ulcers  . Invokamet [Canagliflozin-Metformin Hcl] Other (See Comments)    Yeast Infection  . Lovastatin Rash  . Penicillins Rash    Has patient had a PCN reaction causing immediate rash, facial/tongue/throat swelling, SOB or lightheadedness with hypotension:Yes Has patient had a PCN reaction causing severe rash involving mucus membranes or skin necrosis:all over body Has patient had a PCN reaction that required hospitalization: No Has patient had a PCN reaction occurring within the last 10 years: No If all of the above answers are "NO", then may proceed  with Cephalosporin use.   . Vicodin [Hydrocodone-Acetaminophen] Rash   VITALS:  Blood pressure 126/71, pulse 79, temperature 98.1 F (36.7 C), temperature source Oral, resp. rate 17, height 5\' 3"  (1.6 m), weight 86.2 kg, SpO2 94 %. PHYSICAL EXAMINATION:  Physical Exam HENT:     Head: Normocephalic and atraumatic.  Eyes:     Conjunctiva/sclera: Conjunctivae normal.     Pupils: Pupils are equal, round, and reactive to light.  Neck:     Thyroid: No thyromegaly.     Trachea: No tracheal deviation.  Cardiovascular:     Rate and Rhythm: Normal rate and regular rhythm.     Heart sounds: Normal heart sounds.  Pulmonary:     Effort: Pulmonary effort is normal. No respiratory distress.     Breath sounds: Normal breath sounds. No wheezing.  Chest:     Chest wall: No tenderness.  Abdominal:     General: Bowel sounds are normal. There is no distension.     Palpations: Abdomen is soft.     Tenderness: There is no abdominal tenderness.  Musculoskeletal:        General: Normal range of motion.     Cervical back: Normal range of motion and neck supple.  Skin:    General: Skin is warm and dry.     Findings: No rash.  Neurological:     Mental Status: She is alert and oriented to person, place, and time.     Cranial Nerves: No cranial nerve deficit.    LABORATORY PANEL:  Female CBC Recent Labs  Lab 07/22/20 0728  WBC 4.7  HGB 6.6*  HCT 22.0*  PLT 297   ------------------------------------------------------------------------------------------------------------------  Chemistries  Recent Labs  Lab 07/21/20 1011 07/21/20 1011 07/22/20 0728  NA 139   < > 138  K 3.8   < > 4.0  CL 105   < > 105  CO2 25   < > 27  GLUCOSE 146*   < > 106*  BUN 23   < > 17  CREATININE 0.90   < > 0.87  CALCIUM 8.4*   < > 8.5*  AST 18  --   --   ALT 14  --   --   ALKPHOS 72  --   --   BILITOT 0.6  --   --    < > = values in this interval not displayed.   RADIOLOGY:  No results found. ASSESSMENT  AND PLAN:  74 year old female with a known history of iron deficiency anemia from chronic blood loss, aortic stenosis, dyslipidemia, hypertension, peptic ulcer disease, coronary artery disease status post recent PCI in May 2021 with drug-eluting stent placement on Plavix is admitted for severe anemia.  Acute blood loss anemia Hemoglobin 6.6 status post 1 packed RBC transfusion Will hold Plavix for next 3 to 5 days as she may need upper endoscopy per GI Continue PPI IV twice daily, serial CBCs every 8 hours, transfuse as needed for hemoglobin less than 8 considering cardiac history Avoid nonsteroidals Appreciate GI input Patient had recent upper endoscopy in June 2021 showing 3 AVMs in the jejunum which were coagulated.  Small bowel capsule study showed active bleeding in the small bowel.  Coronary artery disease status post drug-eluting stent on 05/15/2020, on aspirin and Plavix which are being held due to persistent GI bleed.  Patient understands risk of stent rethrombosis  Hypertension Holding her antihypertensive as blood pressure is well controlled and it could drop due to ongoing bleeding  Diabetes mellitus Sliding scale insulin for now  Obesity Body mass index is 33.66 kg/m.      Status is: Inpatient  Remains inpatient appropriate because:Ongoing diagnostic testing needed not appropriate for outpatient work up   Dispo: The patient is from: Home              Anticipated d/c is to: Home              Anticipated d/c date is: 3 days              Patient currently is not medically stable to d/c.  Will likely need upper endoscopy next 2 to 3 days once Plavix wears off considering severe anemia likely underlying GI bleed.    DVT prophylaxis:            SCDs Start: 07/21/20 1702     Family Communication: "discussed with patient"   All the records are reviewed and case discussed with Care Management/Social Worker. Management plans discussed with the patient, nursing and they  are in agreement.  CODE STATUS: Full Code  TOTAL TIME TAKING CARE OF THIS PATIENT: 35 minutes.   More than 50% of the time was spent in counseling/coordination of care: YES  POSSIBLE D/C IN 3-4 DAYS, DEPENDING ON CLINICAL CONDITION.   Max Sane M.D on 07/22/2020 at 2:57 PM  [facility]   CC: Primary care physician; [name], MD  Note: This dictation was prepared with Dragon dictation along with smaller phrase technology. Any transcriptional errors that result from this process are unintentional.

## 2020-07-22 NOTE — ED Notes (Signed)
This RN remained with pt for 40min at initiation of blood transfusion. 13min repeat vitals completed. Pt denies s/s of reaction to blood. Pt within baseline. Given cup of coffee as requested. See paper chart for nursing notes completed during downtime.

## 2020-07-22 NOTE — ED Notes (Signed)
Report off to mac rn  

## 2020-07-22 NOTE — ED Notes (Signed)
Pt assisted up to bedside toilet. Stretcher switched out for centrella in-pt bed for pt's comfort.

## 2020-07-23 ENCOUNTER — Ambulatory Visit: Payer: Self-pay | Admitting: *Deleted

## 2020-07-23 ENCOUNTER — Encounter: Payer: Self-pay | Admitting: Gastroenterology

## 2020-07-23 DIAGNOSIS — D62 Acute posthemorrhagic anemia: Secondary | ICD-10-CM | POA: Diagnosis not present

## 2020-07-23 LAB — HEMOGLOBIN AND HEMATOCRIT, BLOOD
HCT: 28.1 % — ABNORMAL LOW (ref 36.0–46.0)
HCT: 28.7 % — ABNORMAL LOW (ref 36.0–46.0)
Hemoglobin: 8.7 g/dL — ABNORMAL LOW (ref 12.0–15.0)
Hemoglobin: 8.8 g/dL — ABNORMAL LOW (ref 12.0–15.0)

## 2020-07-23 LAB — BPAM RBC
Blood Product Expiration Date: 202108232359
Blood Product Expiration Date: 202108232359
Blood Product Expiration Date: 202108242359
ISSUE DATE / TIME: 202107261615
ISSUE DATE / TIME: 202107270931
ISSUE DATE / TIME: 202107271752
Unit Type and Rh: 5100
Unit Type and Rh: 5100
Unit Type and Rh: 5100

## 2020-07-23 LAB — TYPE AND SCREEN
ABO/RH(D): O POS
Antibody Screen: NEGATIVE
Unit division: 0
Unit division: 0
Unit division: 0

## 2020-07-23 NOTE — Progress Notes (Signed)
[facility] Cardiology  SUBJECTIVE: Patient laying in bed, denies chest pain or shortness of breath   Vitals:   07/22/20 2032 07/23/20 0006 07/23/20 0347 07/23/20 0754  BP: (!) 140/56 125/67 (!) 159/71 (!) 141/64  Pulse: 82 69 69 67  Resp: 16 17  17   Temp: 98.4 F (36.9 C) 98.4 F (36.9 C) 97.6 F (36.4 C) 97.9 F (36.6 C)  TempSrc: Oral Oral Oral   SpO2: 97% 98% 99% 99%  Weight: 88.2 kg     Height: 5\' 3"  (1.6 m)        Intake/Output Summary (Last 24 hours) at 07/23/2020 3086 Last data filed at 07/22/2020 1945 Gross per 24 hour  Intake 2798.33 ml  Output --  Net 2798.33 ml      PHYSICAL EXAM  General: Well developed, well nourished, in no acute distress HEENT:  Normocephalic and atramatic Neck:  No JVD.  Lungs: Clear bilaterally to auscultation and percussion. Heart: HRRR . Normal S1 and S2 without gallops or murmurs.  Abdomen: Bowel sounds are positive, abdomen soft and non-tender  Msk:  Back normal, normal gait. Normal strength and tone for age. Extremities: No clubbing, cyanosis or edema.   Neuro: Alert and oriented X 3. Psych:  Good affect, responds appropriately   LABS: Basic Metabolic Panel: Recent Labs    07/21/20 1011 07/22/20 0728  NA 139 138  K 3.8 4.0  CL 105 105  CO2 25 27  GLUCOSE 146* 106*  BUN 23 17  CREATININE 0.90 0.87  CALCIUM 8.4* 8.5*   Liver Function Tests: Recent Labs    07/21/20 1011  AST 18  ALT 14  ALKPHOS 72  BILITOT 0.6  PROT 7.0  ALBUMIN 3.8   No results for input(s): LIPASE, AMYLASE in the last 72 hours. CBC: Recent Labs    07/21/20 1011 07/21/20 2114 07/22/20 0728 07/22/20 1459 07/22/20 2218 07/23/20 0655  WBC 6.0  --  4.7  --   --   --   NEUTROABS 4.1  --   --   --   --   --   HGB 6.2*   < > 6.6*   < > 9.0* 8.7*  HCT 21.1*   < > 22.0*   < > 29.1* 28.1*  MCV 82.1  --  82.7  --   --   --   PLT 337  --  297  --   --   --    < > = values in this interval not displayed.   Cardiac Enzymes: No results for  input(s): CKTOTAL, CKMB, CKMBINDEX, TROPONINI in the last 72 hours. BNP: Invalid input(s): POCBNP D-Dimer: No results for input(s): DDIMER in the last 72 hours. Hemoglobin A1C: No results for input(s): HGBA1C in the last 72 hours. Fasting Lipid Panel: No results for input(s): CHOL, HDL, LDLCALC, TRIG, CHOLHDL, LDLDIRECT in the last 72 hours. Thyroid Function Tests: No results for input(s): TSH, T4TOTAL, T3FREE, THYROIDAB in the last 72 hours.  Invalid input(s): FREET3 Anemia Panel: No results for input(s): VITAMINB12, FOLATE, FERRITIN, TIBC, IRON, RETICCTPCT in the last 72 hours.  No results found.   Echo   TELEMETRY: Sinus rhythm:  ASSESSMENT AND PLAN:  Principal Problem:   Acute blood loss anemia Active Problems:   CAD (coronary artery disease)   HTN (hypertension)   Aortic stenosis, moderate   Diabetes mellitus without complication (HCC)   Anemia    1.  Coronary artery disease, status post drug-eluting stent proximal RCA 05/15/2020, on aspirin and clopidogrel,  being held for persistent GI bleed, patient at risk for in-stent thrombosis 2.  Moderate aortic stenosis, AVA 0.98 cm, mean gradient 24.2 mmHg, minimally symptomatic 3.  GI bleed, secondary to duodenal AVMs, status post APC 06/14/2020, presents with persistent and recurrent dark stools, markedly anemic with initial hemoglobin hematocrit 6.2 and 21.1, respectively, status post transfusion 2 units PRBCs   Recommendations  1.  Agree with current therapy 2.  Hold aspirin and clopidogrel as little as possible, and resume as soon as possible after endoscopy and stabilization of GI bleed 3.  Defer further cardiac diagnostics at this time   Isaias Cowman, MD, PhD, [facility] 07/23/2020 8:35 AM

## 2020-07-23 NOTE — Progress Notes (Signed)
PROGRESS NOTE    Joyce Potter  YFV:[id number] DOB: 07-13-1946 DOA: 07/21/2020 PCP: [name], MD    Assessment & Plan:   Principal Problem:   Acute blood loss anemia Active Problems:   CAD (coronary artery disease)   HTN (hypertension)   Aortic stenosis, moderate   Diabetes mellitus without complication ([place])   Anemia    Joyce Potter is a 74 y.o. female with medical history significant for iron deficiency anemia from chronic blood loss, aortic stenosis, dyslipidemia, hypertension, peptic ulcer disease, coronary artery disease status post recent PCI in May, 2021 with a drug-eluting stent who was sent to the emergency room for evaluation of anemia.  Patient had complaints of generalized weakness as well as passage of dark stools for a couple of weeks.  She had blood work done and was found to have a hemoglobin of 6.2g/dl.  She had a recent capsule endoscopy in June which showed active bleeding in the proximal small bowel.   Acute blood loss anemia Patient presents for evaluation of weakness admitted to have a hemoglobin of 6.2 g/kg She has had multiple episodes of passage of melena stool for weeks She was on aspirin and Plavix which are currently on hold Patient had a recent capsule endoscopy which showed areas of bleeding in the proximal small intestine --s/p 3u pRBC PLAN: --continue IV PPI --EGD tomorrow after plavix washout   History of coronary artery disease Status post recent stent angioplasty with DES on 05/15/20 --has been on aspirin and Plavix since 05/15/20 --cardiology consulted, agreed with holding dual-plt in the setting of active bleeding. PLAN: --Hold ASA and plavix --Monitor for chest pain and in-stent thrombosis  Hypertension Hold all antihypertensive medications for now since patient is normotensive   Diabetes mellitus Patient is on a clear liquid diet Sliding scale coverage for glycemic control   DVT prophylaxis: SCD/Compression  stockings Code Status: Full code  Family Communication:  Status is: inpatient Dispo:   The patient is from: home Anticipated d/c is to: home Anticipated d/c date is: 1-2 days Patient currently is not medically stable to d/c due to: workup for GI bleed   Subjective and Interval History:  Pt's main complaint was this pain sensation at left temporal area ONLY when she turns her head to the left.  No vision changes.  Symptom has been present for 2 weeks.    Hgb stable after 3u pRBC.   Objective: Vitals:   07/23/20 0754 07/23/20 1110 07/23/20 1543 07/23/20 1940  BP: (!) 141/64 (!) 150/66 (!) 134/50 (!) 155/71  Pulse: 67 77 73 86  Resp: 17 17 16 16   Temp: 97.9 F (36.6 C) 97.7 F (36.5 C) 98.4 F (36.9 C) 98.3 F (36.8 C)  TempSrc:  Oral  Oral  SpO2: 99% 99% 97% 98%  Weight:      Height:        Intake/Output Summary (Last 24 hours) at 07/23/2020 1952 Last data filed at 07/23/2020 0950 Gross per 24 hour  Intake 420 ml  Output --  Net 420 ml   Filed Weights   07/21/20 1232 07/22/20 2032  Weight: 86.2 kg 88.2 kg    Examination:   Constitutional: NAD, AAOx3 HEENT: conjunctivae and lids normal, EOMI CV: RRR 4+ systolic murmur at upper sternal boarders. Distal pulses +2.  No cyanosis.   RESP: CTA B/L, normal respiratory effort  GI: +BS, NTND Extremities: No effusions, edema, or tenderness in BLE SKIN: warm, dry and intact Neuro: II - XII  grossly intact.  Sensation intact Psych: Normal mood and affect.     Data Reviewed: I have personally reviewed following labs and imaging studies  CBC: Recent Labs  Lab 07/21/20 1011 07/21/20 2114 07/22/20 0728 07/22/20 1459 07/22/20 2218 07/23/20 0655 07/23/20 1454  WBC 6.0  --  4.7  --   --   --   --   NEUTROABS 4.1  --   --   --   --   --   --   HGB 6.2*   < > 6.6* 8.2* 9.0* 8.7* 8.8*  HCT 21.1*   < > 22.0* 26.6* 29.1* 28.1* 28.7*  MCV 82.1  --  82.7  --   --   --   --   PLT 337  --  297  --   --   --   --    < > =  values in this interval not displayed.   Basic Metabolic Panel: Recent Labs  Lab 07/21/20 1011 07/22/20 0728  NA 139 138  K 3.8 4.0  CL 105 105  CO2 25 27  GLUCOSE 146* 106*  BUN 23 17  CREATININE 0.90 0.87  CALCIUM 8.4* 8.5*   GFR: Estimated Creatinine Clearance: 61.5 mL/min (by C-G formula based on SCr of 0.87 mg/dL). Liver Function Tests: Recent Labs  Lab 07/21/20 1011  AST 18  ALT 14  ALKPHOS 72  BILITOT 0.6  PROT 7.0  ALBUMIN 3.8   No results for input(s): LIPASE, AMYLASE in the last 168 hours. No results for input(s): AMMONIA in the last 168 hours. Coagulation Profile: No results for input(s): INR, PROTIME in the last 168 hours. Cardiac Enzymes: No results for input(s): CKTOTAL, CKMB, CKMBINDEX, TROPONINI in the last 168 hours. BNP (last 3 results) No results for input(s): PROBNP in the last 8760 hours. HbA1C: No results for input(s): HGBA1C in the last 72 hours. CBG: No results for input(s): GLUCAP in the last 168 hours. Lipid Profile: No results for input(s): CHOL, HDL, LDLCALC, TRIG, CHOLHDL, LDLDIRECT in the last 72 hours. Thyroid Function Tests: No results for input(s): TSH, T4TOTAL, FREET4, T3FREE, THYROIDAB in the last 72 hours. Anemia Panel: No results for input(s): VITAMINB12, FOLATE, FERRITIN, TIBC, IRON, RETICCTPCT in the last 72 hours. Sepsis Labs: No results for input(s): PROCALCITON, LATICACIDVEN in the last 168 hours.  Recent Results (from the past 240 hour(s))  SARS Coronavirus 2 by RT PCR (hospital order, performed in [facility] hospital lab) Nasopharyngeal Nasopharyngeal Swab     Status: None   Collection Time: 07/21/20  4:57 PM   Specimen: Nasopharyngeal Swab  Result Value Ref Range Status   SARS Coronavirus 2 NEGATIVE NEGATIVE Final    Comment: (NOTE) SARS-CoV-2 target nucleic acids are NOT DETECTED.  The SARS-CoV-2 RNA is generally detectable in upper and lower respiratory specimens during the acute phase of infection. The  lowest concentration of SARS-CoV-2 viral copies this assay can detect is 250 copies / mL. A negative result does not preclude SARS-CoV-2 infection and should not be used as the sole basis for treatment or other patient management decisions.  A negative result may occur with improper specimen collection / handling, submission of specimen other than nasopharyngeal swab, presence of viral mutation(s) within the areas targeted by this assay, and inadequate number of viral copies (<250 copies / mL). A negative result must be combined with clinical observations, patient history, and epidemiological information.  Fact Sheet for Patients:   StrictlyIdeas.no  Fact Sheet for Healthcare Providers: BankingDealers.co.za  This test is not yet approved or  cleared by the [place] and has been authorized for detection and/or diagnosis of SARS-CoV-2 by FDA under an Emergency Use Authorization (EUA).  This EUA will remain in effect (meaning this test can be used) for the duration of the COVID-19 declaration under Section 564(b)(1) of the Act, 21 U.S.C. section 360bbb-3(b)(1), unless the authorization is terminated or revoked sooner.  Performed at [facility], [address], [place], [place] [address]       Radiology Studies: No results found.   Scheduled Meds:  sodium chloride   Intravenous Once   gabapentin  300 mg Oral BID   pantoprazole (PROTONIX) IV  40 mg Intravenous Q12H   pravastatin  20 mg Oral QHS   tiotropium  18 mcg Inhalation Daily   Continuous Infusions:   LOS: 1 day     Enzo Bi, MD [facility] If 7PM-7AM, please contact night-coverage 07/23/2020, 7:52 PM

## 2020-07-23 NOTE — Progress Notes (Signed)
Initial Nutrition Assessment  DOCUMENTATION CODES:   Obesity unspecified  INTERVENTION:  Once diet is advanced provide Ensure Max Protein po BID, each supplement provides 150 kcal and 30 grams of protein.  Encouraged adequate intake of protein at meals once diet is advanced.  NUTRITION DIAGNOSIS:   Inadequate oral intake related to decreased appetite as evidenced by per patient/family report.  GOAL:   Patient will meet greater than or equal to 90% of their needs  MONITOR:   Diet advancement, PO intake, Supplement acceptance, Labs, Weight trends, I & O's  REASON FOR ASSESSMENT:   Malnutrition Screening Tool    ASSESSMENT:   73 year old female with PMHx of DM, HTN, CAD s/p DES 05/15/2020, PUD, HLD admitted with acute blood loss anemia.   Met with patient at bedside. She is known to this RD from a recent admission. She reports her appetite remains decreased on Trulicity and she is eating less at meals. She is still able to eat 2 meals per day and snacks occasionally between meals. She typically has chicken or hamburger meat with vegetables/sides. Patient is currently on clear liquid diet and will be NPO after midnight for planned endoscopy tomorrow. She reports she is tolerating clear liquid diet well. Patient is amenable to drinking Ensure Max Protein to help meet protein needs.  Patient reports her UBW was 212 lbs and she has been losing weight. Patient was 95.9 kg on 12/17/2019. She is now 88.2 kg (194.45 lbs). She has lost 7.7 kg (8% body weight) over the past 7 months, which is not significant for time frame but is still concerning.  Medications reviewed and include: Protonix.  Labs reviewed.  NUTRITION - FOCUSED PHYSICAL EXAM:    Most Recent Value  Orbital Region No depletion  Upper Arm Region Mild depletion  Thoracic and Lumbar Region No depletion  Buccal Region No depletion  Temple Region No depletion  Clavicle Bone Region No depletion  Clavicle and Acromion Bone  Region No depletion  Scapular Bone Region No depletion  Dorsal Hand No depletion  Patellar Region No depletion  Anterior Thigh Region No depletion  Posterior Calf Region No depletion  Edema (RD Assessment) None  Hair Reviewed  Eyes Reviewed  Mouth Reviewed  Skin Reviewed  Nails Reviewed     Diet Order:   Diet Order            Diet NPO time specified  Diet effective midnight           Diet clear liquid Room service appropriate? Yes; Fluid consistency: Thin  Diet effective now                EDUCATION NEEDS:   No education needs have been identified at this time  Skin:  Skin Assessment: Reviewed RN Assessment  Last BM:  07/21/2020  Height:   Ht Readings from Last 1 Encounters:  07/22/20 5' 3" (1.6 m)   Weight:   Wt Readings from Last 1 Encounters:  07/22/20 88.2 kg   BMI:  Body mass index is 34.44 kg/m.  Estimated Nutritional Needs:   Kcal:  2000-2200  Protein:  100-110 grams  Fluid:  2 L/day  Leanne King, MS, RD, LDN Pager number available on Amion 

## 2020-07-23 NOTE — Progress Notes (Signed)
[name], MD [address] [place], [place], [place], [address] [address], [place], [place], [place], [address] Phone: [phone]  Fax: [phone]   Subjective: Hemoglobin improved post PRBC transfusion.  Patient has not had a bowel movement since yesterday morning.  No further melena.   Objective: Exam: Vital signs in last 24 hours: Vitals:   07/23/20 0006 07/23/20 0347 07/23/20 0754 07/23/20 1110  BP: 125/67 (!) 159/71 (!) 141/64 (!) 150/66  Pulse: 69 69 67 77  Resp: 17  17 17   Temp: 98.4 F (36.9 C) 97.6 F (36.4 C) 97.9 F (36.6 C) 97.7 F (36.5 C)  TempSrc: Oral Oral  Oral  SpO2: 98% 99% 99% 99%  Weight:      Height:       Weight change: 2.017 kg  Intake/Output Summary (Last 24 hours) at 07/23/2020 1306 Last data filed at 07/23/2020 4503 Gross per 24 hour  Intake 2838.33 ml  Output --  Net 2838.33 ml    General: No acute distress, AAO x3 Abd: Soft, NT/ND, No HSM Skin: Warm, no rashes Neck: Supple, Trachea midline   Lab Results: Lab Results  Component Value Date   WBC 4.7 07/22/2020   HGB 8.7 (L) 07/23/2020   HCT 28.1 (L) 07/23/2020   MCV 82.7 07/22/2020   PLT 297 07/22/2020   Micro Results: Recent Results (from the past 240 hour(s))  SARS Coronavirus 2 by RT PCR (hospital order, performed in [place] hospital lab) Nasopharyngeal Nasopharyngeal Swab     Status: None   Collection Time: 07/21/20  4:57 PM   Specimen: Nasopharyngeal Swab  Result Value Ref Range Status   SARS Coronavirus 2 NEGATIVE NEGATIVE Final    Comment: (NOTE) SARS-CoV-2 target nucleic acids are NOT DETECTED.  The SARS-CoV-2 RNA is generally detectable in upper and lower respiratory specimens during the acute phase of infection. The lowest concentration of SARS-CoV-2 viral copies this assay can detect is 250 copies / mL. A negative result does not preclude SARS-CoV-2 infection and should not be used as the sole basis for treatment or other patient  management decisions.  A negative result may occur with improper specimen collection / handling, submission of specimen other than nasopharyngeal swab, presence of viral mutation(s) within the areas targeted by this assay, and inadequate number of viral copies (<250 copies / mL). A negative result must be combined with clinical observations, patient history, and epidemiological information.  Fact Sheet for Patients:   StrictlyIdeas.no  Fact Sheet for Healthcare Providers: BankingDealers.co.za  This test is not yet approved or  cleared by the [place] FDA and has been authorized for detection and/or diagnosis of SARS-CoV-2 by FDA under an Emergency Use Authorization (EUA).  This EUA will remain in effect (meaning this test can be used) for the duration of the COVID-19 declaration under Section 564(b)(1) of the Act, 21 U.S.C. section 360bbb-3(b)(1), unless the authorization is terminated or revoked sooner.  Performed at [facility], [address] [place], [place] [address]    Studies/Results: No results found. Medications:  Scheduled Meds:  sodium chloride   Intravenous Once   gabapentin  300 mg Oral BID   pantoprazole (PROTONIX) IV  40 mg Intravenous Q12H   pravastatin  20 mg Oral QHS   tiotropium  18 mcg Inhalation Daily   Continuous Infusions: PRN Meds:.amitriptyline, ipratropium-albuterol, ondansetron **OR** ondansetron (ZOFRAN) IV   Assessment: Principal Problem:   Acute blood loss anemia Active Problems:   CAD (coronary artery disease)  HTN (hypertension)   Aortic stenosis, moderate   Diabetes mellitus without complication ([facility])   Anemia    Plan: Endoscopy planned for tomorrow for evaluation of anemia and melena  N.p.o. past midnight  PPI IV twice daily  Continue serial CBCs and transfuse PRN Avoid NSAIDs Maintain 2 large-bore IV lines Please page GI with any acute hemodynamic  changes, or signs of active GI bleeding    LOS: 1 day   [name], MD 07/23/2020, 1:06 PM

## 2020-07-24 ENCOUNTER — Ambulatory Visit: Admission: RE | Admit: 2020-07-24 | Payer: Medicare HMO | Source: Home / Self Care | Admitting: Gastroenterology

## 2020-07-24 ENCOUNTER — Inpatient Hospital Stay: Payer: Medicare HMO | Admitting: Anesthesiology

## 2020-07-24 ENCOUNTER — Ambulatory Visit: Payer: Self-pay | Admitting: *Deleted

## 2020-07-24 ENCOUNTER — Encounter
Admission: EM | Disposition: A | Discharge status: Home / Self Care | Payer: Self-pay | Source: Home / Self Care | Attending: Hospitalist

## 2020-07-24 ENCOUNTER — Encounter: Payer: Self-pay | Admitting: Internal Medicine

## 2020-07-24 DIAGNOSIS — D62 Acute posthemorrhagic anemia: Secondary | ICD-10-CM | POA: Diagnosis not present

## 2020-07-24 DIAGNOSIS — K5521 Angiodysplasia of colon with hemorrhage: Secondary | ICD-10-CM | POA: Diagnosis not present

## 2020-07-24 DIAGNOSIS — K921 Melena: Secondary | ICD-10-CM | POA: Diagnosis not present

## 2020-07-24 HISTORY — PX: ENTEROSCOPY: SHX5533

## 2020-07-24 LAB — CBC
HCT: 30.6 % — ABNORMAL LOW (ref 36.0–46.0)
Hemoglobin: 9.5 g/dL — ABNORMAL LOW (ref 12.0–15.0)
MCH: 25.7 pg — ABNORMAL LOW (ref 26.0–34.0)
MCHC: 31 g/dL (ref 30.0–36.0)
MCV: 82.7 fL (ref 80.0–100.0)
Platelets: 273 10*3/uL (ref 150–400)
RBC: 3.7 MIL/uL — ABNORMAL LOW (ref 3.87–5.11)
RDW: 16.6 % — ABNORMAL HIGH (ref 11.5–15.5)
WBC: 5 10*3/uL (ref 4.0–10.5)
nRBC: 0 % (ref 0.0–0.2)

## 2020-07-24 LAB — BASIC METABOLIC PANEL
Anion gap: 11 (ref 5–15)
BUN: 8 mg/dL (ref 8–23)
CO2: 26 mmol/L (ref 22–32)
Calcium: 8.5 mg/dL — ABNORMAL LOW (ref 8.9–10.3)
Chloride: 104 mmol/L (ref 98–111)
Creatinine, Ser: 0.79 mg/dL (ref 0.44–1.00)
GFR calc Af Amer: >60 mL/min (ref >60)
GFR calc non Af Amer: >60 mL/min (ref >60)
Glucose, Bld: 102 mg/dL — ABNORMAL HIGH (ref 70–99)
Potassium: 4.1 mmol/L (ref 3.5–5.1)
Sodium: 141 mmol/L (ref 135–145)

## 2020-07-24 LAB — MAGNESIUM: Magnesium: 2 mg/dL (ref 1.7–2.4)

## 2020-07-24 LAB — GLUCOSE, CAPILLARY: Glucose-Capillary: 88 mg/dL (ref 70–99)

## 2020-07-24 SURGERY — ENTEROSCOPY
Anesthesia: General

## 2020-07-24 MED ORDER — PROPOFOL 500 MG/50ML IV EMUL
INTRAVENOUS | Status: AC
  Filled 2020-07-24: qty 50

## 2020-07-24 MED ORDER — SODIUM CHLORIDE 0.9 % IV SOLN: INTRAVENOUS | Status: DC

## 2020-07-24 MED ORDER — PROPOFOL 500 MG/50ML IV EMUL
INTRAVENOUS | Status: DC | PRN
  Administered 2020-07-24: 120 ug/kg/min via INTRAVENOUS

## 2020-07-24 MED ORDER — MENTHOL 3 MG MT LOZG
1.0000 | LOZENGE | OROMUCOSAL | Status: DC | PRN
  Filled 2020-07-24: qty 9

## 2020-07-24 MED ORDER — LIDOCAINE HCL (CARDIAC) PF 100 MG/5ML IV SOSY
PREFILLED_SYRINGE | INTRAVENOUS | Status: DC | PRN
  Administered 2020-07-24: 50 mg via INTRAVENOUS

## 2020-07-24 NOTE — Anesthesia Postprocedure Evaluation (Signed)
Anesthesia Post Note  Patient: CAMIKA MARSICO  Procedure(s) Performed: ENTEROSCOPY (N/A )  Patient location during evaluation: Endoscopy Anesthesia Type: General Level of consciousness: awake and alert Pain management: pain level controlled Vital Signs Assessment: post-procedure vital signs reviewed and stable Respiratory status: spontaneous breathing, nonlabored ventilation, respiratory function stable and patient connected to nasal cannula oxygen Cardiovascular status: blood pressure returned to baseline and stable Postop Assessment: no apparent nausea or vomiting Anesthetic complications: no   No complications documented.   Last Vitals:  Vitals:   07/24/20 1117 07/24/20 1126  BP: (!) 121/58 (!) 133/64  Pulse: 73 72  Resp: 22 18  Temp:    SpO2: 95% 95%    Last Pain:  Vitals:   07/24/20 1159  TempSrc:   PainSc: 0-No pain                 Martha Clan

## 2020-07-24 NOTE — Progress Notes (Signed)
Pt returned to unit from Endoscopy in stable condition.

## 2020-07-24 NOTE — Progress Notes (Signed)
[name], MD [address], [place], [place], [place], [address] [address], [place], [place], [place], [address] Phone: [phone]  Fax: [phone]   Subjective:  No abdominal pain, no further melena  Objective: Exam: Vital signs in last 24 hours: Vitals:   07/23/20 1940 07/23/20 2332 07/24/20 0301 07/24/20 0742  BP: (!) 155/71 (!) 132/60 (!) 152/62 (!) 147/88  Pulse: 86 73 70 69  Resp: 16 20  17   Temp: 98.3 F (36.8 C) 97.8 F (36.6 C) 97.6 F (36.4 C) (!) 97.4 F (36.3 C)  TempSrc: Oral Oral Oral Oral  SpO2: 98% 96% 100% 96%  Weight:      Height:       Weight change:   Intake/Output Summary (Last 24 hours) at 07/24/2020 0857 Last data filed at 07/23/2020 0950 Gross per 24 hour  Intake 420 ml  Output --  Net 420 ml    General: No acute distress, AAO x3 Abd: Soft, NT/ND, No HSM Skin: Warm, no rashes Neck: Supple, Trachea midline   Lab Results: Lab Results  Component Value Date   WBC 5.0 07/24/2020   HGB 9.5 (L) 07/24/2020   HCT 30.6 (L) 07/24/2020   MCV 82.7 07/24/2020   PLT 273 07/24/2020   Micro Results: Recent Results (from the past 240 hour(s))  SARS Coronavirus 2 by RT PCR (hospital order, performed in [place] hospital lab) Nasopharyngeal Nasopharyngeal Swab     Status: None   Collection Time: 07/21/20  4:57 PM   Specimen: Nasopharyngeal Swab  Result Value Ref Range Status   SARS Coronavirus 2 NEGATIVE NEGATIVE Final    Comment: (NOTE) SARS-CoV-2 target nucleic acids are NOT DETECTED.  The SARS-CoV-2 RNA is generally detectable in upper and lower respiratory specimens during the acute phase of infection. The lowest concentration of SARS-CoV-2 viral copies this assay can detect is 250 copies / mL. A negative result does not preclude SARS-CoV-2 infection and should not be used as the sole basis for treatment or other patient management decisions.  A negative result may occur with improper specimen collection / handling,  submission of specimen other than nasopharyngeal swab, presence of viral mutation(s) within the areas targeted by this assay, and inadequate number of viral copies (<250 copies / mL). A negative result must be combined with clinical observations, patient history, and epidemiological information.  Fact Sheet for Patients:   StrictlyIdeas.no  Fact Sheet for Healthcare Providers: BankingDealers.co.za  This test is not yet approved or  cleared by the [place] FDA and has been authorized for detection and/or diagnosis of SARS-CoV-2 by FDA under an Emergency Use Authorization (EUA).  This EUA will remain in effect (meaning this test can be used) for the duration of the COVID-19 declaration under Section 564(b)(1) of the Act, 21 U.S.C. section 360bbb-3(b)(1), unless the authorization is terminated or revoked sooner.  Performed at [facility], [address], [place], [place] [address]    Studies/Results: No results found. Medications:  Scheduled Meds: . sodium chloride   Intravenous Once  . gabapentin  300 mg Oral BID  . pantoprazole (PROTONIX) IV  40 mg Intravenous Q12H  . pravastatin  20 mg Oral QHS  . tiotropium  18 mcg Inhalation Daily   Continuous Infusions: PRN Meds:.amitriptyline, ipratropium-albuterol, ondansetron **OR** ondansetron (ZOFRAN) IV   Assessment: Principal Problem:   Acute blood loss anemia Active Problems:   CAD (coronary artery disease)   HTN (hypertension)   Aortic stenosis, moderate   Diabetes mellitus without complication (  [place])   Anemia    Plan: Hemoglobin has been stable since initial PRBC transfusion on admission  Continue serial CBCs and transfuse as needed  Push enteroscopy today to evaluate source of melena on admission  I have discussed alternative options, risks & benefits,  which include, but are not limited to, bleeding, infection, perforation,respiratory complication &  drug reaction.  The patient agrees with this plan & written consent will be obtained.      LOS: 2 days   [name], MD 07/24/2020, 8:57 AM

## 2020-07-24 NOTE — Discharge Summary (Signed)
Physician Discharge Summary   Joyce Potter  female DOB: 02-25-1946  EXN:[id number]  PCP: [name], MD  Admit date: 07/21/2020 Discharge date: 07/24/2020  Admitted From: home Disposition:  home CODE STATUS: Full code  Discharge Instructions    Discharge instructions   Complete by: As directed    EGD found a bleeding spot which GI doctor fixed.  You can resume taking aspirin 81 and Plavix tomorrow if no further bleeding is seen.   Dr. Enzo Bi [facility] Course:  For full details, please see H&P, progress notes, consult notes and ancillary notes.  Briefly,  Joyce Minniear Somersis a 74 y.o.femalewith medical history significant foriron deficiency anemia from chronic blood loss, aortic stenosis, dyslipidemia, hypertension, peptic ulcer disease, coronary artery disease status post recent PCI in May, 2021with a drug-eluting stent who was sent to the emergency room for evaluation of anemia. Patient had complaints of generalized weakness as well as passage of dark stools for a couple of weeks. She had blood work done and was found to have a hemoglobin of 6.2g/dl.She had a recent capsule endoscopy in June which showed active bleeding in the proximal small bowel.   Acute blood loss anemia She has had multiple episodes of passage of melena stool for weeks.  She was on aspirin and Plavix due to recent stent placement, which were held on presentation.  Pt received 3u pRBC and continued on IV PPI.  After waiting for Plavix washout, EGD was performed which found "A single contact bleeding angioectasia in the duodenum. Treated with argon plasma coagulation (APC)."  Prior to discharge, Hgb had increased to 9.5.  GI cleared pt to resume dual-antiplt therapy with ASA and Plavix.  History of coronary artery disease Status post recent DES on 05/15/20 Pt had been on aspirin and Plavix since05/20/21.  Cardiology consulted, agreed with holding dual-anti-plt in the setting of  active bleeding.  Post-EGD and intervention, GI cleared for pt to resume ASA and Plavix.  Hypertension All antihypertensive medications held during hospitalization since patient was normotensive and had acute GI bleed.  Home BP meds resumed at discharge.  Diabetes mellitus Patient was on a clear liquid diet, so only prescribed sliding scale coverage for glycemic control.  Pt was discharged on home insulin regimen.   Discharge Diagnoses:  Principal Problem:   Acute blood loss anemia Active Problems:   CAD (coronary artery disease)   HTN (hypertension)   Aortic stenosis, moderate   Diabetes mellitus without complication (HCC)   Anemia   AVM (arteriovenous malformation) of small bowel, acquired with hemorrhage    Discharge Instructions:  Allergies as of 07/24/2020      Reactions   Sulfa Antibiotics Other (See Comments)   Altered Mental Status   Aspirin Other (See Comments)   ulcers   Invokamet [canagliflozin-metformin Hcl] Other (See Comments)   Yeast Infection   Lovastatin Rash   Penicillins Rash   Has patient had a PCN reaction causing immediate rash, facial/tongue/throat swelling, SOB or lightheadedness with hypotension:Yes Has patient had a PCN reaction causing severe rash involving mucus membranes or skin necrosis:all over body Has patient had a PCN reaction that required hospitalization: No Has patient had a PCN reaction occurring within the last 10 years: No If all of the above answers are "NO", then may proceed with Cephalosporin use.   Vicodin [hydrocodone-acetaminophen] Rash      Medication List    TAKE these medications   alendronate 70 MG  tablet Commonly known as: FOSAMAX Take 70 mg by mouth once a week.   amitriptyline 25 MG tablet Commonly known as: ELAVIL Take 25 mg by mouth at bedtime as needed for sleep.   aspirin 81 MG EC tablet Take 1 tablet (81 mg total) by mouth daily. Swallow whole.   clopidogrel 75 MG tablet Commonly known as: PLAVIX Take  1 tablet (75 mg total) by mouth daily with breakfast.   gabapentin 300 MG capsule Commonly known as: NEURONTIN Take 300 mg by mouth 2 (two) times daily.   insulin NPH-regular Human (70-30) 100 UNIT/ML injection Inject 30 Units into the skin 2 (two) times daily with a meal.   isosorbide mononitrate 30 MG 24 hr tablet Commonly known as: IMDUR Take 30 mg by mouth daily.   lisinopril 5 MG tablet Commonly known as: ZESTRIL Take 5 mg by mouth daily.   metoprolol tartrate 100 MG tablet Commonly known as: LOPRESSOR Take 150 mg by mouth 2 (two) times daily.   pantoprazole 40 MG tablet Commonly known as: PROTONIX Take 40 mg by mouth daily.   pravastatin 20 MG tablet Commonly known as: PRAVACHOL Take 20 mg by mouth at bedtime.   ProAir HFA 108 (90 Base) MCG/ACT inhaler Generic drug: albuterol Inhale 2 puffs into the lungs every 4 (four) hours as needed for wheezing or shortness of breath.   saccharomyces boulardii 250 MG capsule Commonly known as: FLORASTOR Take 250 mg by mouth 2 (two) times daily.   Spiriva HandiHaler 18 MCG inhalation capsule Generic drug: tiotropium Place 18 mcg into inhaler and inhale daily in the afternoon.   traMADol 50 MG tablet Commonly known as: ULTRAM Take 50 mg by mouth every 6 (six) hours as needed for moderate pain.   Trulicity 1.5 KG/8.8PJ Sopn Generic drug: Dulaglutide Inject 0.5 mLs into the skin once a week.        Follow-up Information    [name], MD. Schedule an appointment as soon as possible for a visit in 1 week(s).   Specialty: Internal Medicine Contact information: [place] [id number] [phone]               Allergies  Allergen Reactions  . Sulfa Antibiotics Other (See Comments)    Altered Mental Status  . Aspirin Other (See Comments)    ulcers  . Invokamet [Canagliflozin-Metformin Hcl] Other (See Comments)    Yeast Infection  . Lovastatin Rash  . Penicillins Rash    Has patient had  a PCN reaction causing immediate rash, facial/tongue/throat swelling, SOB or lightheadedness with hypotension:Yes Has patient had a PCN reaction causing severe rash involving mucus membranes or skin necrosis:all over body Has patient had a PCN reaction that required hospitalization: No Has patient had a PCN reaction occurring within the last 10 years: No If all of the above answers are "NO", then may proceed with Cephalosporin use.   . Vicodin [Hydrocodone-Acetaminophen] Rash     The results of significant diagnostics from this hospitalization (including imaging, microbiology, ancillary and laboratory) are listed below for reference.   Consultations:   Procedures/Studies: No results found.    Labs: BNP (last 3 results) No results for input(s): BNP in the last 8760 hours. Basic Metabolic Panel: Recent Labs  Lab 07/21/20 1011 07/22/20 0728 07/24/20 0417  NA 139 138 141  K 3.8 4.0 4.1  CL 105 105 104  CO2 25 27 26   GLUCOSE 146* 106* 102*  BUN 23 17 8   CREATININE 0.90 0.87 0.79  CALCIUM 8.4* 8.5* 8.5*  MG  --   --  2.0   Liver Function Tests: Recent Labs  Lab 07/21/20 1011  AST 18  ALT 14  ALKPHOS 72  BILITOT 0.6  PROT 7.0  ALBUMIN 3.8   No results for input(s): LIPASE, AMYLASE in the last 168 hours. No results for input(s): AMMONIA in the last 168 hours. CBC: Recent Labs  Lab 07/21/20 1011 07/21/20 2114 07/22/20 0728 07/22/20 0728 07/22/20 1459 07/22/20 2218 07/23/20 0655 07/23/20 1454 07/24/20 0417  WBC 6.0  --  4.7  --   --   --   --   --  5.0  NEUTROABS 4.1  --   --   --   --   --   --   --   --   HGB 6.2*   < > 6.6*   < > 8.2* 9.0* 8.7* 8.8* 9.5*  HCT 21.1*   < > 22.0*   < > 26.6* 29.1* 28.1* 28.7* 30.6*  MCV 82.1  --  82.7  --   --   --   --   --  82.7  PLT 337  --  297  --   --   --   --   --  273   < > = values in this interval not displayed.   Cardiac Enzymes: No results for input(s): CKTOTAL, CKMB, CKMBINDEX, TROPONINI in the last 168  hours. BNP: Invalid input(s): POCBNP CBG: Recent Labs  Lab 07/24/20 0944  GLUCAP 88   D-Dimer No results for input(s): DDIMER in the last 72 hours. Hgb A1c No results for input(s): HGBA1C in the last 72 hours. Lipid Profile No results for input(s): CHOL, HDL, LDLCALC, TRIG, CHOLHDL, LDLDIRECT in the last 72 hours. Thyroid function studies No results for input(s): TSH, T4TOTAL, T3FREE, THYROIDAB in the last 72 hours.  Invalid input(s): FREET3 Anemia work up No results for input(s): VITAMINB12, FOLATE, FERRITIN, TIBC, IRON, RETICCTPCT in the last 72 hours. Urinalysis    Component Value Date/Time   COLORURINE YELLOW (A) 07/26/2019 1754   APPEARANCEUR HAZY (A) 07/26/2019 1754   APPEARANCEUR Clear 05/03/2013 1702   LABSPEC 1.031 (H) 07/26/2019 1754   LABSPEC 1.006 05/03/2013 1702   PHURINE 5.0 07/26/2019 1754   GLUCOSEU >=500 (A) 07/26/2019 1754   GLUCOSEU Negative 05/03/2013 1702   HGBUR NEGATIVE 07/26/2019 1754   BILIRUBINUR NEGATIVE 07/26/2019 1754   BILIRUBINUR Negative 05/03/2013 1702   KETONESUR 5 (A) 07/26/2019 1754   PROTEINUR 100 (A) 07/26/2019 1754   NITRITE NEGATIVE 07/26/2019 1754   LEUKOCYTESUR NEGATIVE 07/26/2019 1754   LEUKOCYTESUR 1+ 05/03/2013 1702   Sepsis Labs Invalid input(s): PROCALCITONIN,  WBC,  LACTICIDVEN Microbiology Recent Results (from the past 240 hour(s))  SARS Coronavirus 2 by RT PCR (hospital order, performed in [facility] hospital lab) Nasopharyngeal Nasopharyngeal Swab     Status: None   Collection Time: 07/21/20  4:57 PM   Specimen: Nasopharyngeal Swab  Result Value Ref Range Status   SARS Coronavirus 2 NEGATIVE NEGATIVE Final    Comment: (NOTE) SARS-CoV-2 target nucleic acids are NOT DETECTED.  The SARS-CoV-2 RNA is generally detectable in upper and lower respiratory specimens during the acute phase of infection. The lowest concentration of SARS-CoV-2 viral copies this assay can detect is 250 copies / mL. A negative result does  not preclude SARS-CoV-2 infection and should not be used as the sole basis for treatment or other patient management decisions.  A negative result may occur with improper  specimen collection / handling, submission of specimen other than nasopharyngeal swab, presence of viral mutation(s) within the areas targeted by this assay, and inadequate number of viral copies (<250 copies / mL). A negative result must be combined with clinical observations, patient history, and epidemiological information.  Fact Sheet for Patients:   StrictlyIdeas.no  Fact Sheet for Healthcare Providers: BankingDealers.co.za  This test is not yet approved or  cleared by the [place] FDA and has been authorized for detection and/or diagnosis of SARS-CoV-2 by FDA under an Emergency Use Authorization (EUA).  This EUA will remain in effect (meaning this test can be used) for the duration of the COVID-19 declaration under Section 564(b)(1) of the Act, 21 U.S.C. section 360bbb-3(b)(1), unless the authorization is terminated or revoked sooner.  Performed at [facility], [address] [place], [place] [address]      Total time spend on discharging this patient, including the last patient exam, discussing the hospital stay, instructions for ongoing care as it relates to all pertinent caregivers, as well as preparing the medical discharge records, prescriptions, and/or referrals as applicable, is 40 minutes.    Enzo Bi, MD  [facility] 07/24/2020, 2:38 PM  If 7PM-7AM, please contact night-coverage

## 2020-07-24 NOTE — Transfer of Care (Signed)
Immediate Anesthesia Transfer of Care Note  Patient: Joyce Potter  Procedure(s) Performed: ENTEROSCOPY (N/A )  Patient Location: PACU  Anesthesia Type:General  Level of Consciousness: awake and sedated  Airway & Oxygen Therapy: Patient Spontanous Breathing and Patient connected to nasal cannula oxygen  Post-op Assessment: Report given to RN and Post -op Vital signs reviewed and stable  Post vital signs: Reviewed and stable  Last Vitals:  Vitals Value Taken Time  BP    Temp    Pulse    Resp    SpO2      Last Pain:  Vitals:   07/24/20 0742  TempSrc: Oral  PainSc:          Complications: No complications documented.

## 2020-07-24 NOTE — Anesthesia Procedure Notes (Signed)
Performed by: Cook-Martin, Shahira Fiske Pre-anesthesia Checklist: Patient identified, Emergency Drugs available, Suction available, Patient being monitored and Timeout performed Patient Re-evaluated:Patient Re-evaluated prior to induction Oxygen Delivery Method: Nasal cannula Preoxygenation: Pre-oxygenation with 100% oxygen Induction Type: IV induction Airway Equipment and Method: Bite block Placement Confirmation: positive ETCO2 and CO2 detector       

## 2020-07-24 NOTE — Anesthesia Preprocedure Evaluation (Signed)
Anesthesia Evaluation  Patient identified by MRN, date of birth, ID band Patient awake    Reviewed: Allergy & Precautions, NPO status , Patient's Chart, lab work & pertinent test results  History of Anesthesia Complications Negative for: history of anesthetic complications  Airway Mallampati: III  TM Distance: >3 FB Neck ROM: Full    Dental  (+) Upper Dentures, Lower Dentures, Dental Advidsory Given   Pulmonary neg sleep apnea, neg COPD, former smoker,    breath sounds clear to auscultation- rhonchi (-) wheezing      Cardiovascular hypertension, + CAD, + Past MI, + Cardiac Stents (stent placed 05/16/20, prior stent 2014) and + Peripheral Vascular Disease   Rhythm:Regular Rate:Normal - Systolic murmurs and - Diastolic murmurs    Neuro/Psych neg Seizures CVA negative psych ROS   GI/Hepatic Neg liver ROS, PUD,   Endo/Other  diabetes, Insulin Dependent  Renal/GU negative Renal ROS     Musculoskeletal negative musculoskeletal ROS (+)   Abdominal (+) + obese,   Peds  Hematology  (+) Blood dyscrasia, anemia ,   Anesthesia Other Findings Past Medical History: No date: Anemia     Comment:  iron deficiency No date: Aortic stenosis No date: Basal cell carcinoma No date: CAD (coronary artery disease)     Comment:  s/p Left circumflex stent in 2012 No date: Carotid stenosis No date: Cataract No date: High cholesterol No date: HTN (hypertension) No date: Hyperlipidemia No date: IDDM (insulin dependent diabetes mellitus) No date: Multiple thyroid nodules     Comment:  Benign No date: Peptic ulcer disease No date: Retinal artery occlusion     Comment:  on left   Reproductive/Obstetrics                             Anesthesia Physical  Anesthesia Plan  ASA: III  Anesthesia Plan: General   Post-op Pain Management:    Induction: Intravenous  PONV Risk Score and Plan: 2 and Propofol  infusion and TIVA  Airway Management Planned: Natural Airway and Nasal Cannula  Additional Equipment:   Intra-op Plan:   Post-operative Plan:   Informed Consent: I have reviewed the patients History and Physical, chart, labs and discussed the procedure including the risks, benefits and alternatives for the proposed anesthesia with the patient or authorized representative who has indicated his/her understanding and acceptance.     Dental advisory given  Plan Discussed with: CRNA and Anesthesiologist  Anesthesia Plan Comments: (Discussed with patient high risk for cardiac problems given recent stent placement)        Anesthesia Quick Evaluation

## 2020-07-24 NOTE — Progress Notes (Signed)
[facility]    SUBJECTIVE: The patient reports feeling generally well this morning. She reports mild positional dizziness. She denies chest pain. She has mild chronic exertional shortness of breath, which is unchanged.   Vitals:   07/23/20 1940 07/23/20 2332 07/24/20 0301 07/24/20 0742  BP: (!) 155/71 (!) 132/60 (!) 152/62 (!) 147/88  Pulse: 86 73 70 69  Resp: 16 20  17   Temp: 98.3 F (36.8 C) 97.8 F (36.6 C) 97.6 F (36.4 C) (!) 97.4 F (36.3 C)  TempSrc: Oral Oral Oral Oral  SpO2: 98% 96% 100% 96%  Weight:      Height:         Intake/Output Summary (Last 24 hours) at 07/24/2020 7124 Last data filed at 07/23/2020 0950 Gross per 24 hour  Intake 420 ml  Output --  Net 420 ml      PHYSICAL EXAM  General: Well developed, well nourished, in no acute distress HEENT:  Normocephalic and atramatic Neck:  No JVD.  Lungs: Clear bilaterally to auscultation, normal effort of breathing on room air. Heart: HRRR . 2/6 systolic murmur Abdomen: no obvious distention Msk:  Back normal Normal strength and tone for age. Extremities: No clubbing, cyanosis or edema.   Neuro: Alert and oriented X 3. Psych:  Good affect, responds appropriately   LABS: Basic Metabolic Panel: Recent Labs    07/22/20 0728 07/24/20 0417  NA 138 141  K 4.0 4.1  CL 105 104  CO2 27 26  GLUCOSE 106* 102*  BUN 17 8  CREATININE 0.87 0.79  CALCIUM 8.5* 8.5*  MG  --  2.0   Liver Function Tests: Recent Labs    07/21/20 1011  AST 18  ALT 14  ALKPHOS 72  BILITOT 0.6  PROT 7.0  ALBUMIN 3.8   No results for input(s): LIPASE, AMYLASE in the last 72 hours. CBC: Recent Labs    07/21/20 1011 07/21/20 2114 07/22/20 0728 07/22/20 1459 07/23/20 1454 07/24/20 0417  WBC 6.0  --  4.7  --   --  5.0  NEUTROABS 4.1  --   --   --   --   --   HGB 6.2*   < > 6.6*   < > 8.8* 9.5*  HCT 21.1*   < > 22.0*   < > 28.7* 30.6*  MCV 82.1  --  82.7  --   --  82.7  PLT 337  --  297  --   --  273   < > =  values in this interval not displayed.   Cardiac Enzymes: No results for input(s): CKTOTAL, CKMB, CKMBINDEX, TROPONINI in the last 72 hours. BNP: Invalid input(s): POCBNP D-Dimer: No results for input(s): DDIMER in the last 72 hours. Hemoglobin A1C: No results for input(s): HGBA1C in the last 72 hours. Fasting Lipid Panel: No results for input(s): CHOL, HDL, LDLCALC, TRIG, CHOLHDL, LDLDIRECT in the last 72 hours. Thyroid Function Tests: No results for input(s): TSH, T4TOTAL, T3FREE, THYROIDAB in the last 72 hours.  Invalid input(s): FREET3 Anemia Panel: No results for input(s): VITAMINB12, FOLATE, FERRITIN, TIBC, IRON, RETICCTPCT in the last 72 hours.  No results found.    TELEMETRY: sinus rhythm, 70s  ASSESSMENT AND PLAN:  Principal Problem:   Acute blood loss anemia Active Problems:   CAD (coronary artery disease)   HTN (hypertension)   Aortic stenosis, moderate   Diabetes mellitus without complication (HCC)   Anemia    1.  Coronary artery disease, status post drug-eluting stent  proximal RCA 05/15/2020, on aspirin and clopidogrel, being held for persistent GI bleed; patient at risk for in-stent thrombosis 2.  Moderate aortic stenosis, AVA 0.98 cm, mean gradient 24.2 mmHg, minimally symptomatic 3.  GI bleed, secondary to duodenal AVMs, status post APC 06/14/2020, presents with persistent and recurrent dark stools, markedly anemic with initial hemoglobin and hematocrit 6.2 and 21.1, respectively, status post transfusion 2 units PRBCs. Hemoglobin and hematocrit improved to 9.5 and 30.6, respectively, today. EGD scheduled for today.   Recommendations: 1. Continue to hold ASA and clopidogrel for as little time as possible, and recommend resuming DAPT as soon as safely possible after stabilization of GI bleed. 2. If recommendation per GI is to resume only one antiplatelet, then would consider restarting clopidogrel only.  3. Defer further cardiac diagnostics at this time. Will  continue to follow.  Clabe Seal, PA-C 07/24/2020 8:07 AM  Discussed with Dr. Saralyn Pilar who agrees with the plan.

## 2020-07-24 NOTE — Op Note (Signed)
[facility] Gastroenterology Patient Name: Joyce Potter Procedure Date: 07/24/2020 10:26 AM MRN: [id number] Account #: [id number] Date of Birth: 03-16-1946 Admit Type: Inpatient Age: 74 Room: [facility] ENDO ROOM 3 Gender: Female Note Status: Finalized Procedure:             Small bowel enteroscopy Indications:           Melena Providers:             Alquan Morrish B. Bonna Gains MD, MD Referring MD:          [name], MD (Referring MD) Medicines:             General Anesthesia Complications:         No immediate complications. Procedure:             Pre-Anesthesia Assessment:                        - Prior to the procedure, a History and Physical was                         performed, and patient medications and allergies were                         reviewed. The patient is competent. The risks and                         benefits of the procedure and the sedation options and                         risks were discussed with the patient. All questions                         were answered and informed consent was obtained.                         Patient identification and proposed procedure were                         verified by the physician, the nurse, the                         anesthesiologist, the anesthetist and the technician                         in the pre-procedure area in the procedure room in the                         endoscopy suite. Mental Status Examination: alert and                         oriented. Airway Examination: normal oropharyngeal                         airway and neck mobility. Respiratory Examination:                         clear to auscultation. CV Examination: normal.  Prophylactic Antibiotics: The patient does not require                         prophylactic antibiotics. Prior Anticoagulants: The                         patient has taken Plavix (clopidogrel), last dose was                         3 days  prior to procedure. ASA Grade Assessment: II -                         A patient with mild systemic disease. After reviewing                         the risks and benefits, the patient was deemed in                         satisfactory condition to undergo the procedure. The                         anesthesia plan was to use general anesthesia.                         Immediately prior to administration of medications,                         the patient was re-assessed for adequacy to receive                         sedatives. The heart rate, respiratory rate, oxygen                         saturations, blood pressure, adequacy of pulmonary                         ventilation, and response to care were monitored                         throughout the procedure. The physical status of the                         patient was re-assessed after the procedure.                        After obtaining informed consent, the endoscope was                         passed under direct vision. Throughout the procedure,                         the patient's blood pressure, pulse, and oxygen                         saturations were monitored continuously. The                         Colonoscope was introduced through the mouth and  advanced to the jejunum, to the 160 cm mark (from the                         incisors). The small bowel enteroscopy was                         accomplished with ease. The patient tolerated the                         procedure well. Findings:      The esophagus was normal.      The stomach was normal.      There was no evidence of significant pathology in the duodenal bulb.      A single angioectasia with contact bleeding was found in the second       portion of the duodenum. Coagulation for bleeding prevention using argon       plasma was successful.      There was no evidence of significant pathology in the entire examined       portion of  jejunum. Impression:            - Normal esophagus.                        - Normal stomach.                        - Normal duodenal bulb.                        - A single contact bleeding angioectasia in the                         duodenum. Treated with argon plasma coagulation (APC).                        - The examined portion of the jejunum was normal.                        - No specimens collected. Recommendation:        - Continue Serial CBCs and transfuse PRN                        - Advance diet as tolerated.                        - Discontinue PPI                        - Continue present medications.                        - The findings and recommendations were discussed with                         the patient.                        - The findings and recommendations were discussed with                         the patient's family.  Procedure Code(s):     --- Professional ---                        [phone], Small intestinal endoscopy, enteroscopy beyond                         second portion of duodenum, not including ileum; with                         control of bleeding (eg, injection, bipolar cautery,                         unipolar cautery, laser, heater probe, stapler, plasma                         coagulator) Diagnosis Code(s):     --- Professional ---                        K92.1, Melena (includes Hematochezia) CPT copyright 2019 American Medical Association. All rights reserved. The codes documented in this report are preliminary and upon coder review may  be revised to meet current compliance requirements.  [name], MD Margretta Sidle B. Bonna Gains MD, MD 07/24/2020 11:14:20 AM This report has been signed electronically. Number of Addenda: 0 Note Initiated On: 07/24/2020 10:26 AM Estimated Blood Loss:  Estimated blood loss: none.      [facility]

## 2020-07-24 NOTE — Progress Notes (Signed)
Pt transported off unit to Endo in stable condition.

## 2020-07-24 NOTE — Telephone Encounter (Signed)
Pt's daughter calling, on DPR,pt present. Reports Pts temp 100.9 orally. States discharged fro hospital today after enteroscopy, "Cauterized a place in intestine." Denies any abdominal pain, no N/V. Slight headache. Has not taken any anti pyretics. Advised per home care protocol with modifications. . Advised to call PCP, should have someone on call. If fever reaches 102.0, any abdominal pain go to ED. Verbalizes understanding.  Reason for Disposition . [1] Fever AND [2] no signs of serious infection or localizing symptoms (all other triage questions negative)  Answer Assessment - Initial Assessment Questions 1. TEMPERATURE: "What is the most recent temperature?"  "How was it measured?"      100.9 2. ONSET: "When did the fever start?"      This afternoon 3. SYMPTOMS: "Do you have any other symptoms besides the fever?"  (e.g., colds, headache, sore throat, earache, cough, rash, diarrhea, vomiting, abdominal pain)     Headache 4. CAUSE: If there are no symptoms, ask: "What do you think is causing the fever?"      D/Ced from hospital today 5. CONTACTS: "Does anyone else in the family have an infection?"      6. TREATMENT: "What have you done so far to treat this fever?" (e.g., medications)     no 7. IMMUNOCOMPROMISE: "Do you have of the following: diabetes, HIV positive, splenectomy, cancer chemotherapy, chronic steroid treatment, transplant patient, etc."     9. TRAVEL: "Have you traveled out of the country in the last month?" (e.g., travel history, exposures)     no  Protocols used: FEVER-A-AH

## 2020-07-25 ENCOUNTER — Other Ambulatory Visit: Payer: Self-pay | Admitting: *Deleted

## 2020-07-25 NOTE — Patient Outreach (Signed)
[facility]) Care Management  07/25/2020  Joyce Potter [birth year] [id number]   Late entry for 06/26/20   [facility] multidisciplinary care discussion (MCD)  Pt chart reviewed and template for MCD team meeting sent Pt chart reviewed prior to MCD on today 06/26/20   Pt's THN assessment information reviewed with [facility] MCD team Discussed her medication concerns with Spiriva  Pt has an open referral with [place] Discussed her management of her DM as A1c- 7.3%, Safety- Has had several falls. Unable to afford co-pays for multiple appts. THN CM got her to spread out her appts to better afford co-pays. Family Hx- Alpha-1 (father and sister). THN CM will follow up on GI visit to address meds and GI bleed. GI bleed.   Collaborated with [facility] pharmacy staff Abbey Chatters  Plans  [facility]  RN CM will follow up with Mrs Geffert's GI visits to complete further assessment, check on medication and GI services  Joyce Potter L. Joyce Hamman, RN, BSN, [place] Coordinator Office number [phone] Mobile number [phone]  Main THN number [phone] Fax number [phone]

## 2020-07-25 NOTE — Patient Outreach (Addendum)
[facility]) Care Management  07/25/2020  KELLIE MURRILL May 18, 1946 [id number]   Unsuccesful THN outreach to [facility] referred patient after re admission   Mrs Caracci was referred to [facility] on 05/29/20 for EMMI general discharge (on APL) after her discharge from [place] for Day #4, Wednesday 05/28/20 1069for Unfilled prescriptions. This EMMI was addressed on 06/03/20 when was reach via phone  [place] consulted with [facility] pharmacy staff, Dr Abbey Chatters who had also received a pharmacy referral for Lantus and Spiriva from the patient's MD office Mrs Gilani had been sent patient assistance applications to her home address for Spiriva  Insurance:aetna medicare [facility] admissions x 3ED visits x 3in the last 6 months Last admission on 06/12/20-06/15/20 for GI bleeding secondary to angiodysplastic lesion  05/20/20 to 05/23/20 [place] bleeding and anemia    Outreach unsuccessful No answer. THN RN CM left a HIPAA [employer] Portability and Accountability Act) compliant voicemail message along with CMs contact info.  Since last available outreach patient had visits with endocrinology, gastroenterology, cardiology, was re admitted on 07/21/20 for iron deficiency anemia THN RN CM collaborated with [facility] hospital liaison about patient stay at [place]: [place] sent an unsuccessful outreach letter to this engaged [facility] patient and scheduled this [facility] engaged patient for another call attempt within 3-7 business days  [place] L. [name], RN, BSN, [place] Coordinator Office number [phone] Mobile number [phone]  Main THN number [phone] Fax number [phone]

## 2020-07-26 DIAGNOSIS — Z794 Long term (current) use of insulin: Secondary | ICD-10-CM | POA: Diagnosis not present

## 2020-07-26 DIAGNOSIS — R69 Illness, unspecified: Secondary | ICD-10-CM | POA: Diagnosis not present

## 2020-07-26 DIAGNOSIS — D509 Iron deficiency anemia, unspecified: Secondary | ICD-10-CM | POA: Diagnosis not present

## 2020-07-26 DIAGNOSIS — M3500 Sicca syndrome, unspecified: Secondary | ICD-10-CM | POA: Diagnosis not present

## 2020-07-26 DIAGNOSIS — J449 Chronic obstructive pulmonary disease, unspecified: Secondary | ICD-10-CM | POA: Diagnosis not present

## 2020-07-26 DIAGNOSIS — E785 Hyperlipidemia, unspecified: Secondary | ICD-10-CM | POA: Diagnosis not present

## 2020-07-26 DIAGNOSIS — E1142 Type 2 diabetes mellitus with diabetic polyneuropathy: Secondary | ICD-10-CM | POA: Diagnosis not present

## 2020-07-26 DIAGNOSIS — E1159 Type 2 diabetes mellitus with other circulatory complications: Secondary | ICD-10-CM | POA: Diagnosis not present

## 2020-07-26 DIAGNOSIS — E1151 Type 2 diabetes mellitus with diabetic peripheral angiopathy without gangrene: Secondary | ICD-10-CM | POA: Diagnosis not present

## 2020-07-26 DIAGNOSIS — E669 Obesity, unspecified: Secondary | ICD-10-CM | POA: Diagnosis not present

## 2020-07-29 ENCOUNTER — Other Ambulatory Visit: Payer: Self-pay | Admitting: *Deleted

## 2020-07-29 DIAGNOSIS — E782 Mixed hyperlipidemia: Secondary | ICD-10-CM | POA: Diagnosis not present

## 2020-07-29 DIAGNOSIS — F329 Major depressive disorder, single episode, unspecified: Secondary | ICD-10-CM | POA: Diagnosis not present

## 2020-07-29 DIAGNOSIS — I10 Essential (primary) hypertension: Secondary | ICD-10-CM | POA: Diagnosis not present

## 2020-07-29 DIAGNOSIS — F331 Major depressive disorder, recurrent, moderate: Secondary | ICD-10-CM | POA: Diagnosis not present

## 2020-07-29 DIAGNOSIS — R69 Illness, unspecified: Secondary | ICD-10-CM | POA: Diagnosis not present

## 2020-07-29 DIAGNOSIS — D649 Anemia, unspecified: Secondary | ICD-10-CM | POA: Diagnosis not present

## 2020-07-29 DIAGNOSIS — E118 Type 2 diabetes mellitus with unspecified complications: Secondary | ICD-10-CM | POA: Diagnosis not present

## 2020-07-29 IMAGING — DX DG CHEST 1V PORT
1 series · 1 of 1 positions shown · non-contrast
Comparison: PA and lateral chest 10/13/2018.

CLINICAL DATA: Left rib fractures as seen on the prior study.

EXAM:
PORTABLE CHEST 1 VIEW

[chest ap]
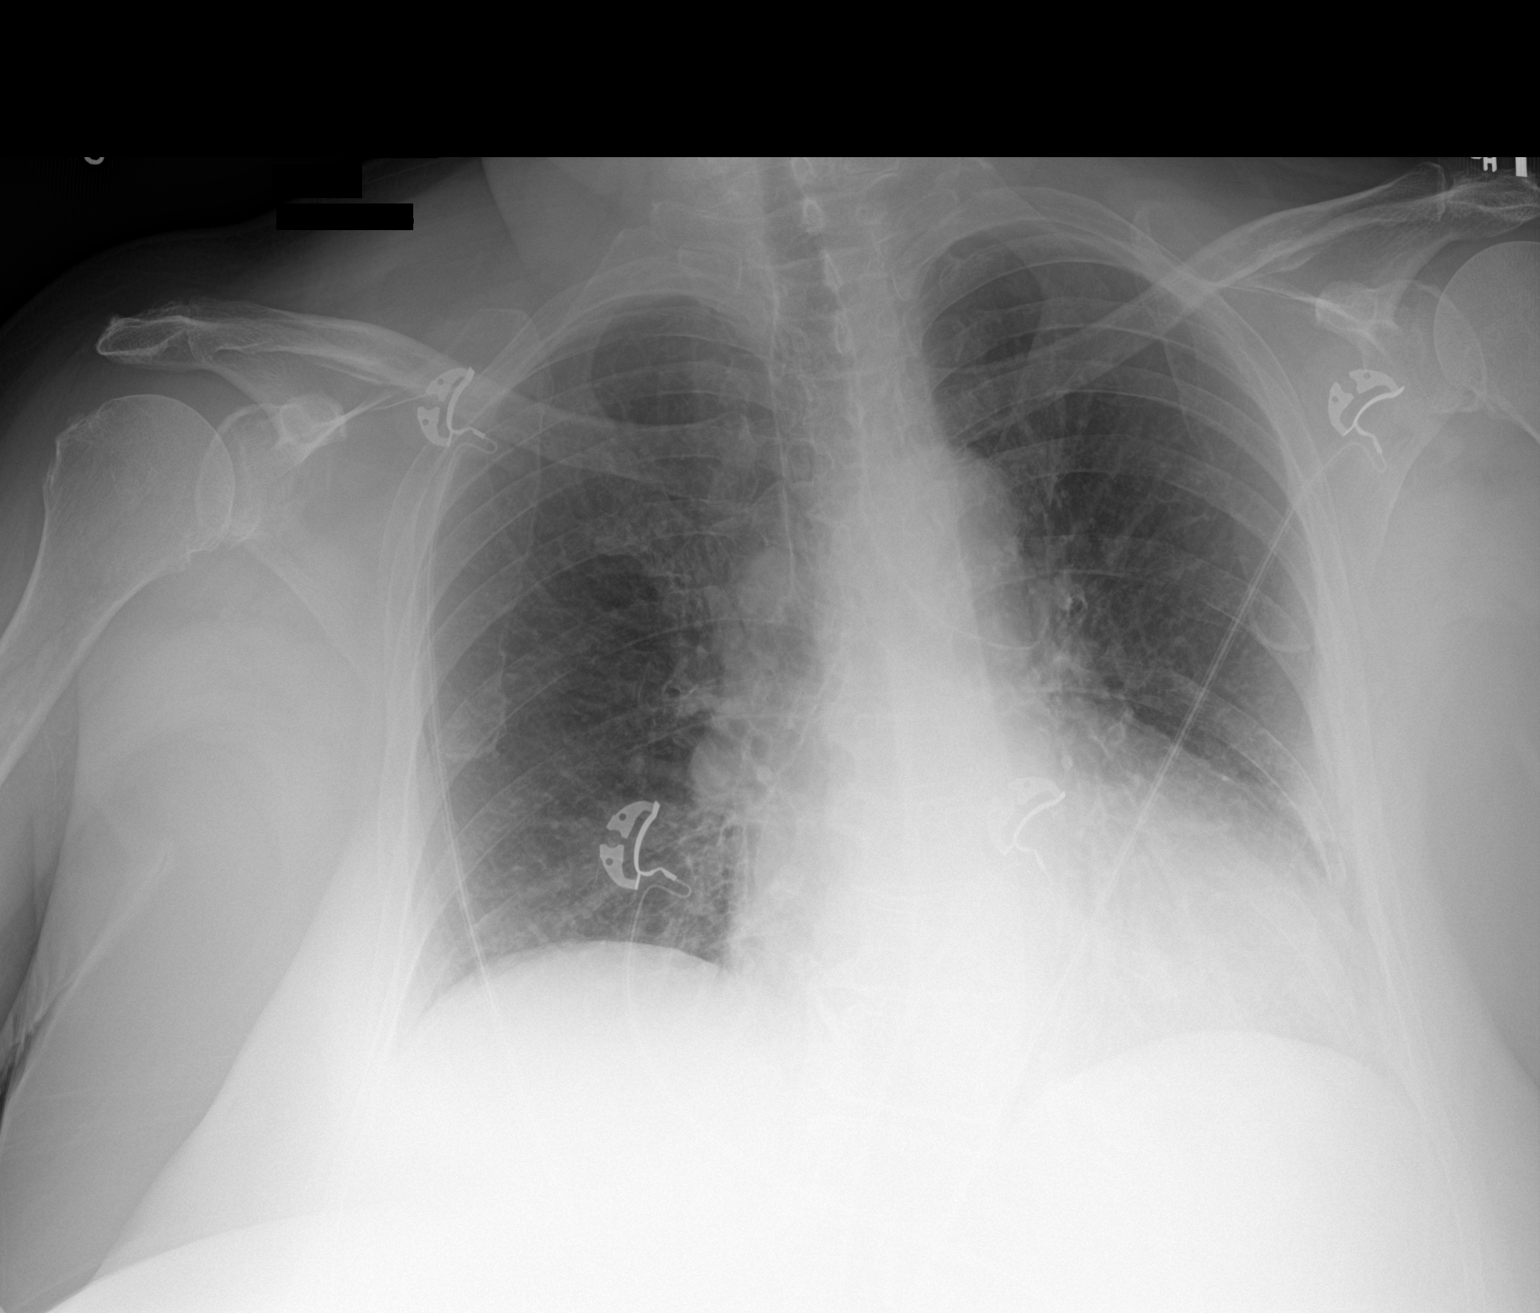

[1 of 1 positions shown; findings below may reference images not displayed]

FINDINGS: There is cardiomegaly without edema. Lungs are clear. No
pneumothorax or pleural effusion. Aortic atherosclerosis is noted.
Left rib fractures are identified as seen on the prior exam. At
least 1 of them is a nonunion.
IMPRESSION: Cardiomegaly without acute disease.

Atherosclerosis.

Remote left rib fractures.  At least 1 of them is a nonunion.

## 2020-07-29 NOTE — Patient Outreach (Addendum)
[facility]) Care Management  07/29/2020  Joyce Potter [birth year] [id number]   Unsuccesful THN outreach (second) toEMMIreferred patient after re admission   [name] was referred to [facility] on 05/29/20 for EMMI general discharge (on APL) after her discharge from [place] ALERTforDay #4,Wednesday 05/28/20 1044forUnfilled prescriptions. This EMMI was addressed on 06/03/20 when was reach via phone [facility] RN CM consulted with [facility] pharmacy staff, Dr Ralene Muskrat alsoreceived a pharmacy referral for Lantus and Spiriva from the patient's MD office Mrs Friend had been sent patient assistance applications to her home address for Spiriva  Insurance:aetna medicare [facility] admissions x3ED visits x3in the last 6 months Last admission on6/17/21-6/20/21 for GI bleeding secondary to angiodysplastic lesion 05/20/20 to 05/23/20 [place] bleeding and anemia    Outreach unsuccessful No answer. THN RN CM left a HIPAA [employer] Portability and Accountability Act) compliant voicemail message along with CM's contact info.  Since last available outreach patient had visits with endocrinology, gastroenterology, cardiology, was re admitted on 07/21/20 for iron deficiency anemia THN RN CM collaborated with [facility] hospital liaison about patient stay at [place]: [name] sent an unsuccessful outreach letter to this engaged [facility] patient on 07/25/20 and scheduled this [facility] engaged patient for a final  call attempt within 3-7 business days  [name], RN, BSN, [place] Coordinator Office number [phone] Mobile number [phone]  Main THN number [phone] Fax number [phone]

## 2020-08-01 ENCOUNTER — Other Ambulatory Visit: Payer: Self-pay | Admitting: *Deleted

## 2020-08-01 ENCOUNTER — Other Ambulatory Visit: Payer: Self-pay

## 2020-08-01 ENCOUNTER — Encounter: Payer: Self-pay | Admitting: *Deleted

## 2020-08-01 DIAGNOSIS — D500 Iron deficiency anemia secondary to blood loss (chronic): Secondary | ICD-10-CM

## 2020-08-01 NOTE — Patient Outreach (Signed)
[facility]) Care Management  08/01/2020  Joyce Potter 29-Jun-1946 [id number]   [facility] outreach to [facility] referred patient  Joyce Potter was referred to [facility] on 05/29/20 for EMMI general discharge (on APL) after her discharge from [place] for Day #4, Wednesday 05/28/20 1082fr Unfilled prescriptions. This EMMI was addressed on 06/03/20 when was reach via phone  [facility] CM [facility], Dr KAbbey Chatterswho had also received a pharmacy referral for Lantus and Spiriva from the patient's MD office Joyce SWaxmanhad been sent patient assistance applications to her home address for Spiriva  Insurance:Aetna medicare [facility] x 4ED visits x 3in the last 6 months Last admission on 07/21/20 to 07/24/20 for acute blood loss anemia/ EGD - sent to ED for bleeding, generalized weakness, dark stools for a couple weeks Hgb 6.2 06/12/20-06/15/20 for GI bleeding secondary to angiodysplastic lesion  05/20/20 to 05/23/20 [place] and anemia 05/15/20 to 05/16/20 CAD chest pain  Outreach successful Patient is able to verify HIPAA ([place] identifiers, date of birth (DOB) and address Reviewed purpose of the follow up   Follow up EMMI red alert - resolved Joyce SHedgepethreports AHol[place] Fallingmedicare is not covering Spiriva therefore her MDs are changing to a generic tier covered medication  Bleeding/anemia Joyce SKayesstates she feels better and "uppity per my son" She confirms her last admission an EGD found another active bleeding source in the proximal small bowel/single contact bleeding angioestrasia in duodenum Treated with argon plasma coagulation, hbg increased to 9.5.  that her GI MD fixed  She is to go on Tuesday 08/05/20 to have labs to see if she needs further iron infusions.  EMMIs Joyce SFreemanreports not receiving the mailed EMMIs but reports difficulty with her mail system at home  related to other items she ordered  COPD Joyce Potter COPD has not been confirmed Her symptoms may have been related to her concerns with GERD She is to have a visit with a pulmonologist to evaluate for COPD   Social:Joyce SKemyah Buseris a 74year old female who has support of her familyHer husband has dementia (she the primary caregiver) She has support of her daughter and son She lives in a mobile home and denies care needs or transportation needs at this time   ConditionsGastrointestinal (GI)bleeding, Iron deficiency Anemia for chronic blood loss, aortic stenosis, AVM (arteriovenous malformation) of small bowel, acquired with hemorrhage Coronary Artery Disease (CAD) s/p PCI in May 15 2020 with drug eluting stent,Chronic obstructive pulmonary disease (COPD),Hypertension (HTN), peptic ulcer disease (PUD),Hyperlipidemia (HLD),Diabetes (DM) type 2(05/21/20 HgA1c 7.3),cerebrovascular accident (CVA),peripheral vascular disease (PVD), multinodular goiter,history (hx)of multiple rib fractures, snoring, chronic fatigue obesity, former smoker incontinence of bowel and bladder Falls 2+ no injury- fell 4 months ago on her steps and twisted her ankle- he son has fixed the step to her mobile home Entry in the mobile home has 7-8 steps on the porch.  DMEglucometercane glasses, CPAP   Appointments Infusions on , 08/04/20, 08/05/20, 09/25/20, 09/26/20  08/04/20 oncology 08/05/20 anemia/iron lab  08/13/20 Gastroenterology Dr BMarlyce Huge9/7/21 Dr KChristianne BorrowCardiology  09/25/20 oncology 09/26/20 Dr TLilian KapurOncology  10/01/20 lab 10/08/20 endocrinology Dr TMee Hives10/15/21 Cardiology Dr FUbaldo Glassing2/25/22 Dr JLeotis Painvascular surgeon  Plan: [facility]RN CM willoutreach to Joyce Somerswithin the next 21-28 business days  Pt encouraged to return a call to [place]  prn  Routed note to MDs/NP/PA   Goals Addressed              This Visit's Progress     Patient Stated   .   Patient will be able to manage her anemia, DM, HTN and CAD at home (pt-stated)        CARE PLAN ENTRY (see longtitudinal plan of care for additional care plan information)  Objective:  . Last practice recorded BP readings:  BP Readings from Last 3 Encounters:  07/24/20 (!) 133/64  07/03/20 112/70  07/02/20 (!) 155/81 .   Marland Kitchen Most recent eGFR/CrCl: No results found for: EGFR  No components found for: CRCL    Current Barriers:  Marland Kitchen Knowledge deficit related to self care management of hypertension . Cognitive Deficits  Case Manager Clinical Goal(s):  Marland Kitchen Over the next 31 days, patient will not experience hospital admission. Hospital Admissions in last 6 months = 4 . Over the next 45 days, patient will attend all scheduled medical appointments: pcp, specialists . over the next 90 days patient will be able to verbalize with outreach interventions to manage GIB, HTN, DM,   Interventions:  Assessed for worsening symptoms related to CAD, HTN, DM . Evaluation of current treatment plan related to hypertension self management and patient's adherence to plan as established by provider. . Reviewed medications with patient and discussed importance of compliance . Discussed plans with patient for ongoing care management follow up and provided patient with direct contact information for care management team . Discussed signs and symptoms of hypertension, DM, GIB/anemia . Reviewed MD/lab visits . Re evaluate EMMI concern for resolution   Patient Self Care Activities:  . Self administers medications as prescribed . Attends all scheduled provider appointments . Calls provider office for new concerns, questions, or BP outside discussed parameters . Adheres to a low sodium diet/DASH diet . Increase physical activity as tolerated . Verbalize where to go to receive medical care . Got to get labs to monitor iron level to see if she needs further iron infusions . Monitors BP and Cbg values   Initial  goal documentation Please see other previous [facility] care plan information listed in Epic under the flow sheet section          Makel Mcmann L. [name], RN, BSN, [place] Coordinator Office number [phone] Mobile number [phone]  Main THN number [phone] Fax number [phone]

## 2020-08-01 NOTE — Progress Notes (Signed)
[place]  [phone] [phone]) [phone]  ID: BRISTOL OSENTOSKI OB: [birth year]  MR#: [id number]  JOI#:[id number]  Patient Care Team: [name], MD as PCP - General (Internal Medicine) Lloyd Huger, MD as Consulting Physician (Hematology and Oncology) Barbaraann Faster, RN as [place] [facility] Fath, Javier Docker, MD as Consulting Physician (Cardiology) Solum, Betsey Holiday, MD as Physician Assistant (Endocrinology) Virgel Manifold, MD as Consulting Physician (Gastroenterology)  CHIEF COMPLAINT: Iron deficiency anemia, secondary to chronic blood loss.  INTERVAL HISTORY: Patient returns to clinic today for repeat laboratory work, further evaluation, and consideration of additional IV Venofer.  She continues to have chronic weakness and fatigue, but otherwise feels well.  She does not complain of black tarry stools today.  She has no neurologic complaints.  She has a good appetite and denies weight loss.  She denies any recent fevers or illnesses.  She denies chest pain, shortness of breath, cough or hemoptysis. She denies any nausea, vomiting, constipation, or diarrhea.  She has no urinary complaints.  Patient offers no further specific complaints today.  REVIEW OF SYSTEMS:   Review of Systems  Constitutional: Positive for malaise/fatigue. Negative for diaphoresis, fever and weight loss.  Eyes: Negative.  Negative for blurred vision.  Respiratory: Negative.  Negative for cough, hemoptysis, shortness of breath and wheezing.   Cardiovascular: Negative.  Negative for chest pain and leg swelling.  Gastrointestinal: Negative.  Negative for abdominal pain, blood in stool and melena.  Genitourinary: Negative.  Negative for flank pain and hematuria.  Musculoskeletal: Negative.  Negative for falls and joint pain.  Skin: Negative.  Negative for rash.  Neurological: Positive for weakness. Negative for dizziness, focal weakness and  headaches.  Psychiatric/Behavioral: Negative.  The patient is not nervous/anxious.    As per HPI. Otherwise, a complete review of systems is negative.  PAST MEDICAL HISTORY: Past Medical History:  Diagnosis Date  . Anemia    iron deficiency  . Aortic stenosis   . Basal cell carcinoma   . CAD (coronary artery disease)    s/p Left circumflex stent in 2012  . Carotid stenosis   . Cataract   . High cholesterol   . HTN (hypertension)   . Hyperlipidemia   . IDDM (insulin dependent diabetes mellitus)   . Multiple thyroid nodules    Benign  . Peptic ulcer disease   . Retinal artery occlusion    on left    PAST SURGICAL HISTORY: Past Surgical History:  Procedure Laterality Date  . ABDOMINAL HYSTERECTOMY    . ARTERY BIOPSY Left 11/19/2015   Procedure: BIOPSY TEMPORAL ARTERY;  Surgeon: Algernon Huxley, MD;  Location: [facility] ORS;  Service: Vascular;  Laterality: Left;  . BREAST BIOPSY Left 2002   core- neg  . BREAST EXCISIONAL BIOPSY    . CARDIAC CATHETERIZATION N/A 08/08/2015   Procedure: Right and Left Heart Cath and Coronary Angiography;  Surgeon: Teodoro Spray, MD;  Location: [place] CV LAB;  Service: Cardiovascular;  Laterality: N/A;  . CARDIAC CATHETERIZATION Bilateral 09/03/2016   Procedure: Right/Left Heart Cath and Coronary Angiography;  Surgeon: Teodoro Spray, MD;  Location: [place] [facility] LAB;  Service: Cardiovascular;  Laterality: Bilateral;  . CATARACT EXTRACTION W/ INTRAOCULAR LENS  IMPLANT, BILATERAL    . COLONOSCOPY     in 2013- internal hemorrhoids  . COLONOSCOPY WITH PROPOFOL N/A 07/07/2018   Procedure: COLONOSCOPY WITH PROPOFOL;  Surgeon: Manya Silvas, MD;  Location: [facility] ENDOSCOPY;  Service:  Endoscopy;  Laterality: N/A;  . COLONOSCOPY WITH PROPOFOL N/A 05/22/2020   Procedure: COLONOSCOPY WITH PROPOFOL;  Surgeon: Jonathon Bellows, MD;  Location[facility] END[facility]COPY;  Service: Gastroenterology;  Laterality: N/A;  . CORONARY ANGIOGRAPHY N/A 09/14/2017   Procedure:  CORONARY ANGIOGRAPHY;  Surgeon: Teodoro Spray, MD;  Location: [place] CV LAB;  Service: Cardiovascular;  Laterality: N/A;  . CORONARY ANGIOPLASTY    . CORONARY STENT INTERVENTION N/A 05/15/2020   Procedure: coronary angiography;  Surgeon: Teodoro Spray, MD;  Location: [place] [facility] LAB;  Service: Cardiovascular;  Laterality: N/A;  . CORONARY STENT INTERVENTION N/A 05/15/2020   Procedure: CORONARY STENT INTERVENTION;  Surgeon: Isaias Cowman, MD;  Location: [place] CV LAB;  Service: Cardiovascular;  Laterality: N/A;  . CORONARY STENT PLACEMENT    . ENDARTERECTOMY Left 10/01/2015   Procedure: ENDARTERECTOMY CAROTID;  Surgeon: Algernon Huxley, MD;  Location: [facility] ORS;  Service: Vascular;  Laterality: Left;  . ENTEROSCOPY N/A 07/24/2020   Procedure: ENTEROSCOPY;  Surgeon: Virgel Manifold, MD;  Location: [facility] ENDOSCOPY;  Service: Endoscopy;  Laterality: N/A;  . ESOPHAGOGASTRODUODENOSCOPY    . ESOPHAGOGASTRODUODENOSCOPY (EGD) WITH PROPOFOL N/A 05/31/2016   Procedure: ESOPHAGOGASTRODUODENOSCOPY (EGD) WITH PROPOFOL;  Surgeon: Manya Silvas, MD;  Location: [facility] ENDOSCOPY;  Service: Endoscopy;  Laterality: N/A;  . ESOPHAGOGASTRODUODENOSCOPY (EGD) WITH PROPOFOL N/A 07/07/2018   Procedure: ESOPHAGOGASTRODUODENOSCOPY (EGD) WITH PROPOFOL;  Surgeon: Manya Silvas, MD;  Location: [facility] ENDOSCOPY;  Service: Endoscopy;  Laterality: N/A;  . ESOPHAGOGASTRODUODENOSCOPY (EGD) WITH PROPOFOL N/A 07/17/2018   Procedure: ESOPHAGOGASTRODUODENOSCOPY (EGD) WITH PROPOFOL;  Surgeon: Lucilla Lame, MD;  Location: [facility] ENDOSCOPY;  Service: Endoscopy;  Laterality: N/A;  . ESOPHAGOGASTRODUODENOSCOPY (EGD) WITH PROPOFOL N/A 05/21/2020   Procedure: ESOPHAGOGASTRODUODENOSCOPY (EGD) WITH PROPOFOL;  Surgeon: Jonathon Bellows, MD;  Location: [facility] ENDOSCOPY;  Service: Gastroenterology;  Laterality: N/A;  . ESOPHAGOGASTRODUODENOSCOPY (EGD) WITH PROPOFOL N/A 06/13/2020   Procedure: ESOPHAGOGASTRODUODENOSCOPY (EGD) WITH  PROPOFOL;  Surgeon: Lucilla Lame, MD;  Location: [facility] ENDOSCOPY;  Service: Endoscopy;  Laterality: N/A;  . EYE SURGERY    . GIVENS CAPSULE STUDY N/A 04/17/2013   Procedure: GIVENS CAPSULE STUDY;  Surgeon: Arta Silence, MD;  Location: [facility] ENDOSCOPY;  Service: Endoscopy;  Laterality: N/A;  patient ate breakfast at 7am   . GIVENS CAPSULE STUDY N/A 06/11/2020   Procedure: GIVENS CAPSULE STUDY;  Surgeon: Virgel Manifold, MD;  Location: [facility] ENDOSCOPY;  Service: Endoscopy;  Laterality: N/A;  . HEMORRHOID BANDING  07/27/2016   Dr Nicholes Stairs  . PARTIAL HYSTERECTOMY    . RIGHT AND LEFT HEART CATH Bilateral 09/14/2017   Procedure: RIGHT AND LEFT HEART CATH;  Surgeon: Teodoro Spray, MD;  Location: [place] CV LAB;  Service: Cardiovascular;  Laterality: Bilateral;  . RIGHT/LEFT HEART CATH AND CORONARY ANGIOGRAPHY Right 05/15/2020   Procedure: Right/Left Heart cath;  Surgeon: Teodoro Spray, MD;  Location: [place] CV LAB;  Service: Cardiovascular;  Laterality: Right;  . TRIGGER FINGER RELEASE      FAMILY HISTORY Family History  Problem Relation Age of Onset  . Uterine cancer Mother   . Breast cancer Mother 11  . Seizures Father   . Stroke Father   . Diabetes Father   . COPD Father   . Colon cancer Neg Hx       ADVANCED DIRECTIVES:    HEALTH MAINTENANCE: Social History   Tobacco Use  . Smoking status: Former Smoker    Quit date: 02/15/1985    Years since quitting: 35.4  . Smokeless tobacco:  Never Used  . Tobacco comment: quit about 31 years ago  Vaping Use  . Vaping Use: Never used  Substance Use Topics  . Alcohol use: No    Alcohol/week: 0.0 standard drinks  . Drug use: No    Colonoscopy:  PAP:  Bone density: Osteoporosis- DEXA 06/2017 (on fosamax)  Lipid panel:  Allergies  Allergen Reactions  . Sulfa Antibiotics Other (See Comments)    Altered Mental Status  . Invokamet [Canagliflozin-Metformin Hcl] Other (See Comments)    Yeast Infection  . Lovastatin Rash    . Penicillins Rash    Has patient had a PCN reaction causing immediate rash, facial/tongue/throat swelling, SOB or lightheadedness with hypotension:Yes Has patient had a PCN reaction causing severe rash involving mucus membranes or skin necrosis:all over body Has patient had a PCN reaction that required hospitalization: No Has patient had a PCN reaction occurring within the last 10 years: No If all of the above answers are "NO", then may proceed with Cephalosporin use.   . Vicodin [Hydrocodone-Acetaminophen] Rash    Current Outpatient Medications  Medication Sig Dispense Refill  . albuterol (PROAIR HFA) 108 (90 Base) MCG/ACT inhaler Inhale 2 puffs into the lungs every 4 (four) hours as needed for wheezing or shortness of breath.     Marland Kitchen alendronate (FOSAMAX) 70 MG tablet Take 70 mg by mouth once a week.     Marland Kitchen amitriptyline (ELAVIL) 25 MG tablet Take 25 mg by mouth at bedtime as needed for sleep.     Marland Kitchen aspirin EC 81 MG EC tablet Take 1 tablet (81 mg total) by mouth daily. Swallow whole. 30 tablet 11  . clopidogrel (PLAVIX) 75 MG tablet Take 1 tablet (75 mg total) by mouth daily with breakfast. 30 tablet 11  . Dulaglutide 1.5 MG/0.5ML SOPN Inject 1.5 mg into the skin every Saturday.     . DULoxetine (CYMBALTA) 20 MG capsule Take 20 mg by mouth 2 (two) times daily.    Marland Kitchen gabapentin (NEURONTIN) 300 MG capsule Take 300 mg by mouth 2 (two) times daily.     . insulin NPH-regular Human (NOVOLIN 70/30) (70-30) 100 UNIT/ML injection Inject 30 Units into the skin 2 (two) times daily with a meal.     . isosorbide mononitrate (IMDUR) 30 MG 24 hr tablet Take 30 mg by mouth daily.     Marland Kitchen lisinopril (ZESTRIL) 5 MG tablet Take 5 mg by mouth daily.     . metoprolol tartrate (LOPRESSOR) 100 MG tablet Take 150 mg by mouth 2 (two) times daily.     . pantoprazole (PROTONIX) 40 MG tablet Take 40 mg by mouth daily.    . pravastatin (PRAVACHOL) 20 MG tablet Take 20 mg by mouth at bedtime.    . saccharomyces boulardii  (FLORASTOR) 250 MG capsule Take 250 mg by mouth 2 (two) times daily.     Marland Kitchen SPIRIVA HANDIHALER 18 MCG inhalation capsule Place 18 mcg into inhaler and inhale daily in the afternoon.     . traMADol (ULTRAM) 50 MG tablet Take 50 mg by mouth every 6 (six) hours as needed for moderate pain.     Marland Kitchen lisinopril (ZESTRIL) 10 MG tablet Take 10 mg by mouth daily.     No current facility-administered medications for this visit.   Facility-Administered Medications Ordered in Other Visits  Medication Dose Route Frequency Provider Last Rate Last Admin  . heparin lock flush 100 unit/mL  500 Units Intravenous Once Verlon Au, NP      .  sodium chloride flush (NS) 0.9 % injection 10 mL  10 mL Intravenous Once Verlon Au, NP        OBJECTIVE: Vitals:   08/05/20 1308  BP: 132/60  Pulse: 90  Temp: 98.5 F (36.9 C)  SpO2: 97%     Body mass index is 33.83 kg/m.    ECOG FS:1 - Symptomatic but completely ambulatory  General: Well-developed, well-nourished, no acute distress. Eyes: Pink conjunctiva, anicteric sclera. HEENT: Normocephalic, moist mucous membranes. Lungs: No audible wheezing or coughing. Heart: Regular rate and rhythm. Abdomen: Soft, nontender, no obvious distention. Musculoskeletal: No edema, cyanosis, or clubbing. Neuro: Alert, answering all questions appropriately. Cranial nerves grossly intact. Skin: No rashes or petechiae noted. Psych: Normal affect.   LAB RESULTS:  Lab Results  Component Value Date   NA 141 07/24/2020   K 4.1 07/24/2020   CL 104 07/24/2020   CO2 26 07/24/2020   GLUCOSE 102 (H) 07/24/2020   BUN 8 07/24/2020   CREATININE 0.79 07/24/2020   CALCIUM 8.5 (L) 07/24/2020   PROT 7.0 07/21/2020   ALBUMIN 3.8 07/21/2020   AST 18 07/21/2020   ALT 14 07/21/2020   ALKPHOS 72 07/21/2020   BILITOT 0.6 07/21/2020   GFRNONAA >60 07/24/2020   GFRAA >60 07/24/2020    Lab Results  Component Value Date   WBC 5.4 08/04/2020   NEUTROABS 3.8 08/04/2020   HGB  9.8 (L) 08/04/2020   HCT 31.5 (L) 08/04/2020   MCV 80.6 08/04/2020   PLT 369 08/04/2020   Lab Results  Component Value Date   IRON 40 08/04/2020   TIBC 339 08/04/2020   IRONPCTSAT 12 08/04/2020    Lab Results  Component Value Date   FERRITIN 12 08/04/2020     STUDIES: No results found.  ASSESSMENT: Iron deficiency anemia, secondary to chronic blood loss.  PLAN:    1. Iron deficiency anemia: Patient reports she recently had a hemoglobin of less than 7 requiring 3 units of packed red blood cells.  Recent colonoscopy and EGD were negative.  She reports several ulcerations noted on video endoscopy.  Previously,nuclear medicine bleeding scan completed on 08/23/2018 did not reveal any significant GI bleed.  Bone marrow biopsy on 02/15/2017 did not reveal any significant pathology.  Patient's hemoglobin remains decreased, but her iron stores are within normal limits.  Despite this, will proceed with 200 mg IV Venofer today.  Return to clinic in 1 week for second infusion and then in 3 months for repeat laboratory work, further evaluation, and continuation of treatment if needed. 2.  Melena: Patient does not complain of this today.  Continue follow-up with GI as scheduled.  I spent a total of 30 minutes reviewing chart data, face-to-face evaluation with the patient, counseling and coordination of care as detailed above.   Patient expressed understanding and was in agreement with this plan. She also understands that She can call clinic at any time with any questions, concerns, or complaints.    Lloyd Huger, MD 08/05/20 4:58 PM

## 2020-08-01 NOTE — Progress Notes (Signed)
ferr

## 2020-08-04 ENCOUNTER — Other Ambulatory Visit: Payer: Self-pay

## 2020-08-04 ENCOUNTER — Inpatient Hospital Stay: Payer: Medicare HMO | Attending: Oncology

## 2020-08-04 DIAGNOSIS — D509 Iron deficiency anemia, unspecified: Secondary | ICD-10-CM | POA: Insufficient documentation

## 2020-08-04 DIAGNOSIS — D500 Iron deficiency anemia secondary to blood loss (chronic): Secondary | ICD-10-CM

## 2020-08-04 LAB — IRON AND TIBC
Iron: 40 ug/dL (ref 28–170)
Saturation Ratios: 12 % (ref 10.4–31.8)
TIBC: 339 ug/dL (ref 250–450)
UIBC: 299 ug/dL

## 2020-08-04 LAB — CBC WITH DIFFERENTIAL/PLATELET
Abs Immature Granulocytes: 0.02 10*3/uL (ref 0.00–0.07)
Basophils Absolute: 0 10*3/uL (ref 0.0–0.1)
Basophils Relative: 1 %
Eosinophils Absolute: 0 10*3/uL (ref 0.0–0.5)
Eosinophils Relative: 1 %
HCT: 31.5 % — ABNORMAL LOW (ref 36.0–46.0)
Hemoglobin: 9.8 g/dL — ABNORMAL LOW (ref 12.0–15.0)
Immature Granulocytes: 0 %
Lymphocytes Relative: 19 %
Lymphs Abs: 1 10*3/uL (ref 0.7–4.0)
MCH: 25.1 pg — ABNORMAL LOW (ref 26.0–34.0)
MCHC: 31.1 g/dL (ref 30.0–36.0)
MCV: 80.6 fL (ref 80.0–100.0)
Monocytes Absolute: 0.5 10*3/uL (ref 0.1–1.0)
Monocytes Relative: 10 %
Neutro Abs: 3.8 10*3/uL (ref 1.7–7.7)
Neutrophils Relative %: 69 %
Platelets: 369 10*3/uL (ref 150–400)
RBC: 3.91 MIL/uL (ref 3.87–5.11)
RDW: 15.8 % — ABNORMAL HIGH (ref 11.5–15.5)
WBC: 5.4 10*3/uL (ref 4.0–10.5)
nRBC: 0 % (ref 0.0–0.2)

## 2020-08-04 LAB — FERRITIN: Ferritin: 12 ng/mL (ref 11–307)

## 2020-08-05 ENCOUNTER — Inpatient Hospital Stay: Payer: Medicare HMO

## 2020-08-05 ENCOUNTER — Inpatient Hospital Stay (HOSPITAL_BASED_OUTPATIENT_CLINIC_OR_DEPARTMENT_OTHER): Payer: Medicare HMO | Admitting: Oncology

## 2020-08-05 ENCOUNTER — Encounter: Payer: Self-pay | Admitting: Oncology

## 2020-08-05 VITALS — BP 120/54 | HR 72 | Resp 16

## 2020-08-05 VITALS — BP 132/60 | HR 90 | Temp 98.5°F | Wt 191.0 lb

## 2020-08-05 DIAGNOSIS — D500 Iron deficiency anemia secondary to blood loss (chronic): Secondary | ICD-10-CM

## 2020-08-05 DIAGNOSIS — D509 Iron deficiency anemia, unspecified: Secondary | ICD-10-CM | POA: Diagnosis not present

## 2020-08-05 MED ORDER — SODIUM CHLORIDE 0.9 % IV SOLN: 200.0000 mg | Freq: Once | INTRAVENOUS | Status: DC

## 2020-08-05 MED ORDER — SODIUM CHLORIDE 0.9 % IV SOLN
Freq: Once | INTRAVENOUS | Status: AC
  Filled 2020-08-05: qty 250

## 2020-08-05 MED ORDER — IRON SUCROSE 20 MG/ML IV SOLN
200.0000 mg | Freq: Once | INTRAVENOUS | Status: AC
  Administered 2020-08-05: 200 mg via INTRAVENOUS
  Filled 2020-08-05: qty 10

## 2020-08-05 NOTE — Progress Notes (Signed)
Pt eating and drinkin good. She had to go to hosp and get 3 units of blood 2-3 weeks ago. She ahd called triage but never got call back. Since using probiotics she is having normal BM. No dark bowe movement and no blood seen in stools

## 2020-08-12 ENCOUNTER — Telehealth: Payer: Self-pay | Admitting: *Deleted

## 2020-08-12 ENCOUNTER — Inpatient Hospital Stay: Payer: Medicare HMO

## 2020-08-12 DIAGNOSIS — I700 Atherosclerosis of aorta: Secondary | ICD-10-CM | POA: Insufficient documentation

## 2020-08-12 NOTE — Telephone Encounter (Signed)
Pt has been reschedule and contacted.

## 2020-08-12 NOTE — Telephone Encounter (Signed)
Patient called reporting that she has a temp 100.6, headache and not feeling well and thinks she should reschedule her appointment today. She is down for ron infusion only

## 2020-08-13 ENCOUNTER — Other Ambulatory Visit: Payer: Self-pay

## 2020-08-13 ENCOUNTER — Encounter: Payer: Self-pay | Admitting: Gastroenterology

## 2020-08-13 ENCOUNTER — Ambulatory Visit: Payer: Medicare HMO | Admitting: Gastroenterology

## 2020-08-13 VITALS — BP 116/71 | HR 87 | Temp 98.3°F | Wt 191.0 lb

## 2020-08-13 DIAGNOSIS — D500 Iron deficiency anemia secondary to blood loss (chronic): Secondary | ICD-10-CM | POA: Diagnosis not present

## 2020-08-13 DIAGNOSIS — G4733 Obstructive sleep apnea (adult) (pediatric): Secondary | ICD-10-CM | POA: Diagnosis not present

## 2020-08-13 DIAGNOSIS — K625 Hemorrhage of anus and rectum: Secondary | ICD-10-CM | POA: Diagnosis not present

## 2020-08-13 NOTE — Patient Instructions (Addendum)
Please discontinue taking Metamucil and Miralax daily.

## 2020-08-14 NOTE — Progress Notes (Signed)
[name], MD [address]  [place]  [place], [place] [address]  Main: [phone]  Fax: [phone]   Primary Care Physician: [name], MD   Chief Complaint  Patient presents with  . IDA    Patient noticed that she had some rectal bleeding two days ago.    HPI: Joyce Potter is a 74 y.o. female here for follow-up of iron deficiency anemia.  Since her last EGD with APC of small bowel AVM, she has not noted any further melena.  Her hemoglobin has actually increased and this is much improved from her previous hemoglobins that continued to be low despite previous treatments.  This is reassuring.  Patient denies any abdominal pain.  She did report small amount of blood on wiping and in the toilet bowl, that she thinks may have been due to irritation in the area.  She has had previous colonoscopies, recently please see colonoscopy report.  Current Outpatient Medications  Medication Sig Dispense Refill  . albuterol (PROAIR HFA) 108 (90 Base) MCG/ACT inhaler Inhale 2 puffs into the lungs every 4 (four) hours as needed for wheezing or shortness of breath.     Marland Kitchen alendronate (FOSAMAX) 70 MG tablet Take 70 mg by mouth once a week.     Marland Kitchen amitriptyline (ELAVIL) 25 MG tablet Take 25 mg by mouth at bedtime as needed for sleep.     Marland Kitchen aspirin EC 81 MG EC tablet Take 1 tablet (81 mg total) by mouth daily. Swallow whole. 30 tablet 11  . clopidogrel (PLAVIX) 75 MG tablet Take 1 tablet (75 mg total) by mouth daily with breakfast. 30 tablet 11  . Dulaglutide 1.5 MG/0.5ML SOPN Inject 1.5 mg into the skin every Saturday.     . DULoxetine (CYMBALTA) 20 MG capsule Take 20 mg by mouth 2 (two) times daily.    Marland Kitchen gabapentin (NEURONTIN) 300 MG capsule Take 300 mg by mouth 2 (two) times daily.     . insulin NPH-regular Human (NOVOLIN 70/30) (70-30) 100 UNIT/ML injection Inject 30 Units into the skin 2 (two) times daily with a meal.     . isosorbide mononitrate (IMDUR) 30 MG 24 hr tablet  Take 30 mg by mouth daily.     Marland Kitchen lisinopril (ZESTRIL) 10 MG tablet Take 10 mg by mouth daily.    Marland Kitchen lisinopril (ZESTRIL) 5 MG tablet Take 5 mg by mouth daily.     . metoprolol tartrate (LOPRESSOR) 100 MG tablet Take 150 mg by mouth 2 (two) times daily.     . pantoprazole (PROTONIX) 40 MG tablet Take 40 mg by mouth daily.    . pravastatin (PRAVACHOL) 20 MG tablet Take 20 mg by mouth at bedtime.    . saccharomyces boulardii (FLORASTOR) 250 MG capsule Take 250 mg by mouth 2 (two) times daily.     Marland Kitchen SPIRIVA HANDIHALER 18 MCG inhalation capsule Place 18 mcg into inhaler and inhale daily in the afternoon.     . traMADol (ULTRAM) 50 MG tablet Take 50 mg by mouth every 6 (six) hours as needed for moderate pain.      No current facility-administered medications for this visit.   Facility-Administered Medications Ordered in Other Visits  Medication Dose Route Frequency Provider Last Rate Last Admin  . heparin lock flush 100 unit/mL  500 Units Intravenous Once Verlon Au, NP      . sodium chloride flush (NS) 0.9 % injection 10 mL  10 mL Intravenous Once [name] Rutter  G, NP        Allergies as of 08/13/2020 - Review Complete 08/13/2020  Allergen Reaction Noted  . Sulfa antibiotics Other (See Comments) 04/13/2013  . Invokamet [canagliflozin-metformin hcl] Other (See Comments) 08/08/2015  . Lovastatin Rash 08/08/2015  . Penicillins Rash 04/13/2013  . Vicodin [hydrocodone-acetaminophen] Rash 04/13/2013    ROS:  General: Negative for anorexia, weight loss, fever, chills, fatigue, weakness. ENT: Negative for hoarseness, difficulty swallowing , nasal congestion. CV: Negative for chest pain, angina, palpitations, dyspnea on exertion, peripheral edema.  Respiratory: Negative for dyspnea at rest, dyspnea on exertion, cough, sputum, wheezing.  GI: See history of present illness. GU:  Negative for dysuria, hematuria, urinary incontinence, urinary frequency, nocturnal urination.  Endo: Negative for  unusual weight change.    Physical Examination:   BP 116/71   Pulse 87   Temp 98.3 F (36.8 C) (Oral)   Wt 191 lb (86.6 kg)   BMI 33.83 kg/m   General: Well-nourished, well-developed in no acute distress.  Eyes: No icterus. Conjunctivae pink. Mouth: Oropharyngeal mucosa moist and pink , no lesions erythema or exudate. Neck: Supple, Trachea midline Abdomen: Bowel sounds are normal, nontender, nondistended, no hepatosplenomegaly or masses, no abdominal bruits or hernia , no rebound or guarding.   Extremities: No lower extremity edema. No clubbing or deformities. Neuro: Alert and oriented x 3.  Grossly intact. Skin: Warm and dry, no jaundice.   Psych: Alert and cooperative, normal mood and affect.   Labs: CMP     Component Value Date/Time   NA 141 07/24/2020 0417   NA 143 05/04/2013 0614   K 4.1 07/24/2020 0417   K 3.8 05/04/2013 0614   CL 104 07/24/2020 0417   CL 110 (H) 05/04/2013 0614   CO2 26 07/24/2020 0417   CO2 27 05/04/2013 0614   GLUCOSE 102 (H) 07/24/2020 0417   GLUCOSE 130 (H) 05/04/2013 0614   BUN 8 07/24/2020 0417   BUN 12 05/04/2013 0614   CREATININE 0.79 07/24/2020 0417   CREATININE 0.79 08/27/2014 1445   CALCIUM 8.5 (L) 07/24/2020 0417   CALCIUM 8.2 (L) 05/04/2013 0614   PROT 7.0 07/21/2020 1011   PROT 7.6 08/27/2014 1445   ALBUMIN 3.8 07/21/2020 1011   ALBUMIN 3.5 08/27/2014 1445   AST 18 07/21/2020 1011   AST 27 08/27/2014 1445   ALT 14 07/21/2020 1011   ALT 27 08/27/2014 1445   ALKPHOS 72 07/21/2020 1011   ALKPHOS 101 08/27/2014 1445   BILITOT 0.6 07/21/2020 1011   BILITOT 0.4 08/27/2014 1445   GFRNONAA >60 07/24/2020 0417   GFRNONAA >60 08/27/2014 1445   GFRAA >60 07/24/2020 0417   GFRAA >60 08/27/2014 1445   Lab Results  Component Value Date   WBC 5.4 08/04/2020   HGB 9.8 (L) 08/04/2020   HCT 31.5 (L) 08/04/2020   MCV 80.6 08/04/2020   PLT 369 08/04/2020    Imaging Studies: No results found.  Assessment and Plan:   Joyce Potter is a 74 y.o. y/o female here for follow-up of iron deficiency anemia and also reporting small amount of bright red blood per rectum  Hemoglobin is now improving compared to before which was not improving with iron replacement.  This is the highest hemoglobin she has had in a while.  Her melena has completely resolved.  Given the above I suspect that the source of ongoing iron deficiency anemia may have been the AVM that we were able to treat on her last EGD  Continue  follow-up with hematology for iron replacement and repeat hemoglobin  Continue follow-up in our clinic closely  Patient is deferring rectal exam today as she states small amount of bright red blood that she thinks may have come from irritation or straining is now resolved and she did not have this episode yesterday.  She does not want a rectal exam today.  However, if symptoms recur she will call us back.  High-fiber diet discussed.  Patient encouraged to start MiraLAX to help maintain soft stool.  It is likely due to hemorrhoids.    Dr [name]

## 2020-08-20 ENCOUNTER — Other Ambulatory Visit: Payer: Self-pay

## 2020-08-20 ENCOUNTER — Inpatient Hospital Stay: Payer: Medicare HMO

## 2020-08-20 VITALS — BP 117/54 | HR 66 | Temp 97.1°F | Resp 20

## 2020-08-20 DIAGNOSIS — D500 Iron deficiency anemia secondary to blood loss (chronic): Secondary | ICD-10-CM

## 2020-08-20 DIAGNOSIS — D509 Iron deficiency anemia, unspecified: Secondary | ICD-10-CM | POA: Diagnosis not present

## 2020-08-20 MED ORDER — SODIUM CHLORIDE 0.9 % IV SOLN
INTRAVENOUS | Status: DC
  Filled 2020-08-20: qty 250

## 2020-08-20 MED ORDER — IRON SUCROSE 20 MG/ML IV SOLN
200.0000 mg | Freq: Once | INTRAVENOUS | Status: AC
  Administered 2020-08-20: 200 mg via INTRAVENOUS
  Filled 2020-08-20: qty 10

## 2020-08-20 MED ORDER — SODIUM CHLORIDE 0.9 % IV SOLN: 200.0000 mg | Freq: Once | INTRAVENOUS | Status: DC

## 2020-08-22 ENCOUNTER — Ambulatory Visit: Payer: Self-pay | Admitting: *Deleted

## 2020-08-26 ENCOUNTER — Other Ambulatory Visit: Payer: Self-pay | Admitting: *Deleted

## 2020-08-26 NOTE — Patient Outreach (Signed)
[facility] Care Management  08/26/2020  SHRISTI SCHEIB [birth year] [id number]   UnsuccesfulTHN outreach tocomplex care patient  Mrs Weightman was referred to [facility] on 05/29/20 for EMMI general discharge (on APL) after her discharge from [facility] ALERTforDay #4,Wednesday 05/28/20 1045forUnfilled prescriptions. This EMMI was addressed on 06/03/20 when was reach via phone [facility] RN CM consulted with [facility] pharmacy staff, Dr Ralene Muskrat alsoreceived a pharmacy referral for Lantus and Spiriva from the patient's MD office Mrs Ganci had been sent patient assistance applications to her home address for Spiriva  Insurance:[employer] medicare [facility] admissions x3ED visits x3in the last 6 months Last admission on7/26/21 to 07/24/20 for acute blood loss anemia/ EGD - sent to ED for bleeding, generalized weakness, dark stools for a couple weeks Hgb 6.2 06/12/20-06/15/20 for GI bleeding secondary to angiodysplastic lesion 05/20/20 to 05/23/20 [place] bleeding and anemia 05/15/20 to 05/16/20 CAD chest pain     Outreachunsuccessful No answer. [facility] RN CM leftaHIPAA [employer] Portability and Accountability Act) compliant voicemail message along with CM's contact info.   Plan: [facility] RN CMscheduled this [facility] engaged patient for a final  call attempt within7-14business days  Pocahontas Cohenour L. [name], RN, BSN, [place] Coordinator Office number [phone] Mobile number [phone]  Main [facility] number [phone] Fax number [phone]

## 2020-09-02 ENCOUNTER — Other Ambulatory Visit: Payer: Self-pay | Admitting: *Deleted

## 2020-09-02 DIAGNOSIS — Z20822 Contact with and (suspected) exposure to covid-19: Secondary | ICD-10-CM | POA: Diagnosis not present

## 2020-09-02 NOTE — Patient Outreach (Signed)
[facility]) Care Management  09/02/2020  VON INSCOE [birth year] [id number]   Second UnsuccesfulTHN outreachtocomplex care patient  Mrs Heinlein was referred to [facility] on 05/29/20 for EMMI general discharge (on APL) after her discharge from [place] ALERTforDay #4,Wednesday 05/28/20 1076forUnfilled prescriptions. This EMMI was addressed on 06/03/20 when was reach via phone [facility] RN CM consulted with [facility] pharmacy staff, Dr Ralene Muskrat alsoreceived a pharmacy referral for Lantus and Spiriva from the patient's MD office Mrs Winborne had been sent patient assistance applications to her home address for Spiriva  Insurance:aetna medicare [facility] x3ED visits x3in the last 6 months Last admission on7/26/21 to 07/24/20 for acute blood loss anemia/ EGD - sent to ED for bleeding, generalized weakness, dark stools for a couple weeks Hgb 6.2 06/12/20-06/15/20 for GI bleeding secondary to angiodysplastic lesion 05/20/20 to 05/23/20 [place] bleeding and anemia 05/15/20 to 05/16/20 CAD chest pain     Outreachunsuccessful No answer. THN RN CM leftaHIPAA [employer] Portability and Accountability Act) compliant voicemail message along with CM's contact info.   Plan: [facility] RN CMscheduled this THN engaged patient for afinalcall attempt within7-14business days  Floria Brandau L. [name], RN, BSN, [place] Coordinator Office number [phone] Mobile number [phone]  Main THN number [phone] Fax number [phone]

## 2020-09-03 ENCOUNTER — Ambulatory Visit: Payer: Medicare HMO | Admitting: Gastroenterology

## 2020-09-06 DIAGNOSIS — R69 Illness, unspecified: Secondary | ICD-10-CM | POA: Diagnosis not present

## 2020-09-08 ENCOUNTER — Other Ambulatory Visit: Payer: Self-pay | Admitting: *Deleted

## 2020-09-08 ENCOUNTER — Other Ambulatory Visit: Payer: Self-pay

## 2020-09-08 NOTE — Patient Outreach (Signed)
[facility]) Care Management  09/08/2020  KARLEA MCKIBBIN 24-Dec-1946 [id number]  [facility] outreach to complex care patient  Mrs Manville was referred to [facility] on 05/29/20 for EMMI general discharge (on APL) after her discharge from [place] ALERTforDay #4,Wednesday 05/28/20 105frUnfilled prescriptions. This EMMI was addressed on 06/03/20 when was reach via phone [place] Dr KRalene Muskratalsoreceived a pharmacy referral for Lantus and Spiriva from the patient's MD officein June 2021  Mrs SMccumbershad been sent patient assistance applications to her home address for Spiriva  Insurance:aetna medicare [facility] x4ED visits x3in the last 6 months Last admission on7/26/21 to 07/24/20 for acute blood loss anemia/ EGD - sent to ED for bleeding, generalized weakness, dark stools for a couple weeks Hgb 6.2 06/12/20-06/15/20 for GI bleeding secondary to angiodysplastic lesion 05/20/20 to 05/23/20 [place]eding and anemia 05/15/20 to 05/16/20 CAD chest pain   Last successful outreach on 08/01/20 - 2 unsuccessful outreaches on 08/26/20 & 09/02/20  Successful outreach today 09/08/20  Patient is able to verify HIPAA (HW[place]and [place] identifiers, date of birth (DOB) and address Reviewed and addressed the purpose of the follow up call with the patient  [facility]: [facility] CM [facility] with patient. Patient gave verbal consent for services.  Update/progression Mrs SKangasconfirms since the last outreach with her she had to go to the hospital to have 3 units of blood.  She reports no further blood in her stools. Stools are not tarry but brown She has noted some blood "smear every time I use the restroom" when wiping her perineal area. She state she believes this is related to her "fistula" She reports wiping so much in her perineal area  that she is "raw" She reports now drinking "4 ounces of prune juice a day" to assist with constipation and to assist with iron. She confirms her iron infusions are now monthly and she has not had labs for iron level monitoring since 08/05/20  She ws encouraged to outreach to her MDs to get labs completed if she continued to have micro bleeding   Missed iron infusion on 08/12/20 - Pt noted to call to cancel and reschedule as she had a reported temperature of 100.6, headache and not feeling well She reports she believes she was in contact with a positive covid patient.  She reports quarantine for 10 days   Medication assistance Today Mrs SShearerconfirms she continues with cost concern for Spiriva and Trulicity In the initial referral Lantus and Spiriva were reported. THN RN CM reviewed THN  RN CM notes in June in which pt had reported she had received forms but had not had time to review them. Pharmacy is noted to have unsuccessful attempts and no responses from patient to continue to work with her. Case closure for medication assistance was completed She reports calling [place] today 09/08/20 with an attempt to "use a [employer] brand" without success. She is requesting patient assistance forms to be resent to her   TShubertconsulted with KAbbey Chatters Pharmacist related to Mrs SBisaillonreported not received patient assistance medicine forms. Forms had been sent to correct address as verified by copy of June 06 2020 letter in [place]   Social:Mrs STazaria Dlugoszis a 74year old female who has support of her familyHer husband has dementia (she the primary caregiver) She has support  of her daughterand son She lives in a mobile home and denies care needs or transportation needs at this time  ConditionsGastrointestinal (GI)bleeding, Iron deficiency Anemia for chronic blood loss, aortic stenosis, AVM (arteriovenous malformation) of small bowel, acquired with hemorrhage Coronary Artery Disease (CAD) s/p PCI in  May 15 2020 with drug eluting stent,Chronic obstructive pulmonary disease (COPD),Hypertension (HTN), peptic ulcer disease (PUD),Hyperlipidemia (HLD),Diabetes (DM) type 2(05/21/20 HgA1c 7.3),cerebrovascular accident (CVA),peripheral vascular disease (PVD), multinodular goiter,history (hx)of multiple rib fractures, snoring, chronic fatigue obesity, former smoker incontinence of bowel and bladder Falls 2+ no injury- fell 4 months ago on her steps and twisted her ankle- he son has fixed the step to her mobile home Entry in the mobile home has 7-8 steps on the porch. DMEglucometercane glasses, CPAP   Appointments Infusions on 09/25/20, 09/26/20  09/25/20 oncology 09/26/20 Dr Lilian Kapur Oncology  10/01/20 lab 10/08/20 endocrinology Dr Mee Hives 10/10/20 Cardiology Dr Ubaldo Glassing 02/20/21 Dr Leotis Pain vascular surgeon  Plan: [facility] RN CM willoutreach to Mrs Somerswithin the next 14-21business days  Pt encouraged to return a call to [facility] RN CM prn  Routed note to MDs/NP/PA  Goals Addressed              This Visit's Progress     Patient Stated   .  Patient will be able to manage her anemia, DM, HTN and CAD at home (pt-stated)   On track     [place] (see longtitudinal plan of care for additional care plan information)  Objective:  . Last practice recorded BP readings:  BP Readings from Last 3 Encounters:  08/20/20 (!) 117/54  08/13/20 116/71  08/05/20 (!) 120/54 .   Marland Kitchen Most recent eGFR/CrCl: No results found for: EGFR  No components found for: CRCL    Current Barriers:  Marland Kitchen Knowledge deficit related to self care management of hypertension . Cognitive Deficits  Case Manager Clinical Goal(s):  Marland Kitchen Over the next 31 days, patient will not experience hospital admission. Hospital Admissions in last 6 months = 4 . Over the next 45 days, patient will attend all scheduled medical appointments: pcp, specialists . over the next 90 days patient will be able to verbalize  with outreach interventions to manage GIB, HTN, DM,  09/08/20 progression of goals- Last discharge on 07/24/20 No hospital admission goal met in last 31 days. Progressing with attendance of scheduled appointments Pt noted with  She is taking in prune juice to assist with iron and reports taking medicines as ordered  Interventions:  Assessed for worsening symptoms related to CAD, HTN, DM . Evaluation of current treatment plan related to hypertension self management and patient's adherence to plan as established by provider. . Reviewed medications with patient and discussed importance of compliance . Discussed plans with patient for ongoing care management follow up and provided patient with direct contact information for care management team . Discussed signs and symptoms of hypertension, DM, GIB/anemia . Reviewed MD/lab visits . Re evaluate EMMI concern for resolution . Re evaluate for care coordination needs . Provide education of home management of anemia management . Encouraged outreach to MD if continue with micro bleeding episodes  . Collaborate with pharmacy related to medication assistance forms    Patient Self Care Activities:  . Self administers medications as prescribed . Attends all scheduled provider appointments . Calls provider office for new concerns, questions, or BP outside discussed parameters . Adheres to a low sodium diet/DASH diet . Increase physical activity as tolerated . Verbalize  where to go to receive medical care . Got to get labs to monitor iron level to see if she needs further iron infusions . Monitors BP and Cbg values   Please see past updates related to this goal by clicking on the "Past Updates" button in the selected goal  Please see other previous [facility] care plan information listed in Epic under the flow sheet section          [name], RN, BSN, [place] Coordinator Office number [phone] Main [facility]  number [phone] Fax number [phone]

## 2020-09-12 DIAGNOSIS — E559 Vitamin D deficiency, unspecified: Secondary | ICD-10-CM | POA: Diagnosis not present

## 2020-09-12 DIAGNOSIS — E118 Type 2 diabetes mellitus with unspecified complications: Secondary | ICD-10-CM | POA: Diagnosis not present

## 2020-09-12 DIAGNOSIS — I2510 Atherosclerotic heart disease of native coronary artery without angina pectoris: Secondary | ICD-10-CM | POA: Diagnosis not present

## 2020-09-12 DIAGNOSIS — D508 Other iron deficiency anemias: Secondary | ICD-10-CM | POA: Diagnosis not present

## 2020-09-12 DIAGNOSIS — Z23 Encounter for immunization: Secondary | ICD-10-CM | POA: Diagnosis not present

## 2020-09-12 DIAGNOSIS — I10 Essential (primary) hypertension: Secondary | ICD-10-CM | POA: Diagnosis not present

## 2020-09-12 DIAGNOSIS — E782 Mixed hyperlipidemia: Secondary | ICD-10-CM | POA: Diagnosis not present

## 2020-09-13 DIAGNOSIS — G4733 Obstructive sleep apnea (adult) (pediatric): Secondary | ICD-10-CM | POA: Diagnosis not present

## 2020-09-19 NOTE — Progress Notes (Signed)
[place]  [phone] [phone]  ID: Joyce Potter OB: May 30, 1946  MR#: [id number]  QBH#:[id number]  Patient Care Team: [name], MD as PCP - General (Internal Medicine) Lloyd Huger, MD as Consulting Physician (Hematology and Oncology) Barbaraann Faster, RN as [place] Management Fath, Javier Docker, MD as Consulting Physician (Cardiology) Solum, Betsey Holiday, MD as Physician Assistant (Endocrinology) Virgel Manifold, MD as Consulting Physician (Gastroenterology)  CHIEF COMPLAINT: Iron deficiency anemia, secondary to chronic blood loss.  INTERVAL HISTORY: Patient returns to clinic today for repeat laboratory work, further evaluation, and continuation of treatment with IV Venofer.  She has noticed increased weakness and fatigue recently, but otherwise has felt well. She does not complain of black tarry stools today and her last ablation was approximately 1 month ago. She has no neurologic complaints.  She has a good appetite and denies weight loss.  She denies any recent fevers or illnesses.  She denies chest pain, shortness of breath, cough or hemoptysis. She denies any nausea, vomiting, constipation, or diarrhea.  She has no urinary complaints.  Patient offers no further specific complaints today.  REVIEW OF SYSTEMS:   Review of Systems  Constitutional: Positive for malaise/fatigue. Negative for diaphoresis, fever and weight loss.  Eyes: Negative.  Negative for blurred vision.  Respiratory: Negative.  Negative for cough, hemoptysis, shortness of breath and wheezing.   Cardiovascular: Negative.  Negative for chest pain and leg swelling.  Gastrointestinal: Negative.  Negative for abdominal pain, blood in stool and melena.  Genitourinary: Negative.  Negative for flank pain and hematuria.  Musculoskeletal: Negative.  Negative for falls and joint pain.  Skin: Negative.  Negative for rash.  Neurological: Positive for  weakness. Negative for dizziness, focal weakness and headaches.  Psychiatric/Behavioral: Negative.  The patient is not nervous/anxious.    As per HPI. Otherwise, a complete review of systems is negative.  PAST MEDICAL HISTORY: Past Medical History:  Diagnosis Date  . Anemia    iron deficiency  . Aortic stenosis   . Basal cell carcinoma   . CAD (coronary artery disease)    s/p Left circumflex stent in 2012  . Carotid stenosis   . Cataract   . High cholesterol   . HTN (hypertension)   . Hyperlipidemia   . IDDM (insulin dependent diabetes mellitus)   . Multiple thyroid nodules    Benign  . Peptic ulcer disease   . Retinal artery occlusion    on left    PAST SURGICAL HISTORY: Past Surgical History:  Procedure Laterality Date  . ABDOMINAL HYSTERECTOMY    . ARTERY BIOPSY Left 11/19/2015   Procedure: BIOPSY TEMPORAL ARTERY;  Surgeon: Algernon Huxley, MD;  Location: [facility] ORS;  Service: Vascular;  Laterality: Left;  . BREAST BIOPSY Left 2002   core- neg  . BREAST EXCISIONAL BIOPSY    . CARDIAC CATHETERIZATION N/A 08/08/2015   Procedure: Right and Left Heart Cath and Coronary Angiography;  Surgeon: Teodoro Spray, MD;  Location: [place] CV LAB;  Service: Cardiovascular;  Laterality: N/A;  . CARDIAC CATHETERIZATION Bilateral 09/03/2016   Procedure: Right/Left Heart Cath and Coronary Angiography;  Surgeon: Teodoro Spray, MD;  Location: [place] CV LAB;  Service: Cardiovascular;  Laterality: Bilateral;  . CATARACT EXTRACTION W/ INTRAOCULAR LENS  IMPLANT, BILATERAL    . COLONOSCOPY     in 2013- internal hemorrhoids  . COLONOSCOPY WITH PROPOFOL N/A 07/07/2018   Procedure: COLONOSCOPY WITH PROPOFOL;  Surgeon: Vira Agar,  Gavin Pound, MD;  Location: [facility] ENDOSCOPY;  Service: Endoscopy;  Laterality: N/A;  . COLONOSCOPY WITH PROPOFOL N/A 05/22/2020   Procedure: COLONOSCOPY WITH PROPOFOL;  Surgeon: Jonathon Bellows, MD;  [facility] ENDOSCOPY;  Service: Gastroenterology;  Laterality: N/A;  .  CORONARY ANGIOGRAPHY N/A 09/14/2017   Procedure: CORONARY ANGIOGRAPHY;  Surgeon: Teodoro Spray, MD;  Location: [place] CV LAB;  Service: Cardiovascular;  Laterality: N/A;  . CORONARY ANGIOPLASTY    . CORONARY STENT INTERVENTION N/A 05/15/2020   Procedure: coronary angiography;  Surgeon: Teodoro Spray, MD;  Location: [place] CV LAB;  Service: Cardiovascular;  Laterality: N/A;  . CORONARY STENT INTERVENTION N/A 05/15/2020   Procedure: CORONARY STENT INTERVENTION;  Surgeon: Isaias Cowman, MD;  Location: [place] CV LAB;  Service: Cardiovascular;  Laterality: N/A;  . CORONARY STENT PLACEMENT    . ENDARTERECTOMY Left 10/01/2015   Procedure: ENDARTERECTOMY CAROTID;  Surgeon: Algernon Huxley, MD;  Location: [facility] ORS;  Service: Vascular;  Laterality: Left;  . ENTEROSCOPY N/A 07/24/2020   Procedure: ENTEROSCOPY;  Surgeon: Virgel Manifold, MD;  Location: [facility] ENDOSCOPY;  Service: Endoscopy;  Laterality: N/A;  . ESOPHAGOGASTRODUODENOSCOPY    . ESOPHAGOGASTRODUODENOSCOPY (EGD) WITH PROPOFOL N/A 05/31/2016   Procedure: ESOPHAGOGASTRODUODENOSCOPY (EGD) WITH PROPOFOL;  Surgeon: Manya Silvas, MD;  Location[facility]COPY;  Service: Endoscopy;  Laterality: N/A;  . ESOPHAGOGASTRODUODENOSCOPY (EGD) WITH PROPOFOL N/A 07/07/2018   Procedure: ESOPHAGOGASTRODUODENOSCOPY (EGD) WITH PROPOFOL;  Surgeon: Manya Silvas, MD;  Location: [facility] ENDOSCOPY;  Service: Endoscopy;  Laterality: N/A;  . ESOPHAGOGASTRODUODENOSCOPY (EGD) WITH PROPOFOL N/A 07/17/2018   Procedure: ESOPHAGOGASTRODUODENOSCOPY (EGD) WITH PROPOFOL;  Surgeon: Lucilla Lame, MD;  Location: [facility] ENDOSCOPY;  Service: Endoscopy;  Laterality: N/A;  . ESOPHAGOGASTRODUODENOSCOPY (EGD) WITH PROPOFOL N/A 05/21/2020   Procedure: ESOPHAGOGASTRODUODENOSCOPY (EGD) WITH PROPOFOL;  Surgeon: Jonathon Bellows, MD;  Location: [facility] ENDOSCOPY;  Service: Gastroenterology;  Laterality: N/A;  . ESOPHAGOGASTRODUODENOSCOPY (EGD) WITH PROPOFOL N/A 06/13/2020    Procedure: ESOPHAGOGASTRODUODENOSCOPY (EGD) WITH PROPOFOL;  Surgeon: Lucilla Lame, MD;  Location: [facility] ENDOSCOPY;  Service: Endoscopy;  Laterality: N/A;  . EYE SURGERY    . GIVENS CAPSULE STUDY N/A 04/17/2013   Procedure: GIVENS CAPSULE STUDY;  Surgeon: Arta Silence, MD;  Location[facility] END[facility];  Service: Endoscopy;  Laterality: N/A;  patient ate breakfast at 7am   . GIVENS CAPSULE STUDY N/A 06/11/2020   Procedure: GIVENS CAPSULE STUDY;  Surgeon: Virgel Manifold, MD;  Location: [facility] ENDOSCOPY;  Service: Endoscopy;  Laterality: N/A;  . HEMORRHOID BANDING  07/27/2016   Dr Nicholes Stairs  . PARTIAL HYSTERECTOMY    . RIGHT AND LEFT HEART CATH Bilateral 09/14/2017   Procedure: RIGHT AND LEFT HEART CATH;  Surgeon: Teodoro Spray, MD;  Location: [place] CV LAB;  Service: Cardiovascular;  Laterality: Bilateral;  . RIGHT/LEFT HEART CATH AND CORONARY ANGIOGRAPHY Right 05/15/2020   Procedure: Right/Left Heart cath;  Surgeon: Teodoro Spray, MD;  Location: [place] CV LAB;  Service: Cardiovascular;  Laterality: Right;  . TRIGGER FINGER RELEASE      FAMILY HISTORY Family History  Problem Relation Age of Onset  . Uterine cancer Mother   . Breast cancer Mother 39  . Seizures Father   . Stroke Father   . Diabetes Father   . COPD Father   . Colon cancer Neg Hx       ADVANCED DIRECTIVES:    HEALTH MAINTENANCE: Social History   Tobacco Use  . Smoking status: Former Smoker    Quit date: 02/15/1985  Years since quitting: 35.6  . Smokeless tobacco: Never Used  . Tobacco comment: quit about 31 years ago  Vaping Use  . Vaping Use: Never used  Substance Use Topics  . Alcohol use: No    Alcohol/week: 0.0 standard drinks  . Drug use: No    Colonoscopy:  PAP:  Bone density: Osteoporosis- DEXA 06/2017 (on fosamax)  Lipid panel:  Allergies  Allergen Reactions  . Sulfa Antibiotics Other (See Comments)    Altered Mental Status  . Invokamet [Canagliflozin-Metformin Hcl] Other (See  Comments)    Yeast Infection  . Lovastatin Rash  . Penicillins Rash    Has patient had a PCN reaction causing immediate rash, facial/tongue/throat swelling, SOB or lightheadedness with hypotension:Yes Has patient had a PCN reaction causing severe rash involving mucus membranes or skin necrosis:all over body Has patient had a PCN reaction that required hospitalization: No Has patient had a PCN reaction occurring within the last 10 years: No If all of the above answers are "NO", then may proceed with Cephalosporin use.   . Vicodin [Hydrocodone-Acetaminophen] Rash    Current Outpatient Medications  Medication Sig Dispense Refill  . albuterol (PROAIR HFA) 108 (90 Base) MCG/ACT inhaler Inhale 2 puffs into the lungs every 4 (four) hours as needed for wheezing or shortness of breath.     Marland Kitchen alendronate (FOSAMAX) 70 MG tablet Take 70 mg by mouth once a week.     Marland Kitchen amitriptyline (ELAVIL) 25 MG tablet Take 25 mg by mouth at bedtime as needed for sleep.     Marland Kitchen aspirin EC 81 MG EC tablet Take 1 tablet (81 mg total) by mouth daily. Swallow whole. 30 tablet 11  . clopidogrel (PLAVIX) 75 MG tablet Take 1 tablet (75 mg total) by mouth daily with breakfast. 30 tablet 11  . Dulaglutide 1.5 MG/0.5ML SOPN Inject 1.5 mg into the skin every Saturday.     . DULoxetine (CYMBALTA) 20 MG capsule Take 20 mg by mouth 2 (two) times daily.    Marland Kitchen gabapentin (NEURONTIN) 300 MG capsule Take 300 mg by mouth 2 (two) times daily.     . insulin NPH-regular Human (NOVOLIN 70/30) (70-30) 100 UNIT/ML injection Inject 30 Units into the skin 2 (two) times daily with a meal.     . isosorbide mononitrate (IMDUR) 30 MG 24 hr tablet Take 30 mg by mouth daily.     Marland Kitchen lisinopril (ZESTRIL) 10 MG tablet Take 10 mg by mouth daily.    Marland Kitchen lisinopril (ZESTRIL) 5 MG tablet Take 5 mg by mouth daily.     . metoprolol tartrate (LOPRESSOR) 100 MG tablet Take 150 mg by mouth 2 (two) times daily.     Glory Rosebush VERIO test strip SMARTSIG:Via Meter    .  pantoprazole (PROTONIX) 40 MG tablet Take 40 mg by mouth daily.    . pravastatin (PRAVACHOL) 20 MG tablet Take 20 mg by mouth at bedtime.    . saccharomyces boulardii (FLORASTOR) 250 MG capsule Take 250 mg by mouth 2 (two) times daily.     Marland Kitchen SPIRIVA HANDIHALER 18 MCG inhalation capsule Place 18 mcg into inhaler and inhale daily in the afternoon.     . traMADol (ULTRAM) 50 MG tablet Take 50 mg by mouth every 6 (six) hours as needed for moderate pain.      No current facility-administered medications for this visit.   Facility-Administered Medications Ordered in Other Visits  Medication Dose Route Frequency Provider Last Rate Last Admin  . 0.9 %  sodium chloride infusion   Intravenous Continuous Lloyd Huger, MD 20 mL/hr at 09/26/20 1340 New Bag at 09/26/20 1340  . heparin lock flush 100 unit/mL  500 Units Intravenous Once Verlon Au, NP      . iron sucrose (VENOFER) injection 200 mg  200 mg Intravenous Once Lloyd Huger, MD      . sodium chloride flush (NS) 0.9 % injection 10 mL  10 mL Intravenous Once Verlon Au, NP        OBJECTIVE: Vitals:   09/25/20 1517  BP: (!) 139/53  Pulse: 62  Resp: 20  Temp: 98 F (36.7 C)  SpO2: 98%     Body mass index is 33.9 kg/m.    ECOG FS:1 - Symptomatic but completely ambulatory  General: Well-developed, well-nourished, no acute distress. Eyes: Pink conjunctiva, anicteric sclera. HEENT: Normocephalic, moist mucous membranes. Lungs: No audible wheezing or coughing. Heart: Regular rate and rhythm. Abdomen: Soft, nontender, no obvious distention. Musculoskeletal: No edema, cyanosis, or clubbing. Neuro: Alert, answering all questions appropriately. Cranial nerves grossly intact. Skin: No rashes or petechiae noted. Psych: Normal affect.   LAB RESULTS:  Lab Results  Component Value Date   NA 141 07/24/2020   K 4.1 07/24/2020   CL 104 07/24/2020   CO2 26 07/24/2020   GLUCOSE 102 (H) 07/24/2020   BUN 8 07/24/2020    CREATININE 0.79 07/24/2020   CALCIUM 8.5 (L) 07/24/2020   PROT 7.0 07/21/2020   ALBUMIN 3.8 07/21/2020   AST 18 07/21/2020   ALT 14 07/21/2020   ALKPHOS 72 07/21/2020   BILITOT 0.6 07/21/2020   GFRNONAA >60 07/24/2020   GFRAA >60 07/24/2020    Lab Results  Component Value Date   WBC 4.9 09/25/2020   NEUTROABS 3.0 09/25/2020   HGB 8.7 (L) 09/25/2020   HCT 27.8 (L) 09/25/2020   MCV 84.0 09/25/2020   PLT 311 09/25/2020   Lab Results  Component Value Date   IRON 25 (L) 09/25/2020   TIBC 307 09/25/2020   IRONPCTSAT 8 (L) 09/25/2020    Lab Results  Component Value Date   FERRITIN 19 09/25/2020     STUDIES: No results found.  ASSESSMENT: Iron deficiency anemia, secondary to chronic blood loss.  PLAN:    1. Iron deficiency anemia: Patient's hemoglobin and iron stores have trended down and she is symptomatic.  Patient's most recent luminal evaluation was in July 2021. Bone marrow biopsy on 02/15/2017 did not reveal any significant pathology.  Proceed with 200 mg IV Venofer today.  Return to clinic in 1 and 2 weeks for additional IV Venofer.  Patient will then return to clinic in 3 months with repeat laboratory work, further evaluation, and continuation of treatment if needed. 2.  Melena: Patient does not complain of this today.  Continue follow-up with GI as scheduled.  I spent a total of 30 minutes reviewing chart data, face-to-face evaluation with the patient, counseling and coordination of care as detailed above.   Patient expressed understanding and was in agreement with this plan. She also understands that She can call clinic at any time with any questions, concerns, or complaints.    Lloyd Huger, MD 09/26/20 1:42 PM

## 2020-09-22 ENCOUNTER — Other Ambulatory Visit: Payer: Self-pay | Admitting: *Deleted

## 2020-09-22 ENCOUNTER — Other Ambulatory Visit: Payer: Self-pay

## 2020-09-22 NOTE — Patient Outreach (Signed)
[facility]) Care Management  09/22/2020  JOURNEI THOMASSEN June 06, 1946 [id number]   [facility] outreach to complex care patient  Mrs Santee was referred to [facility] on 05/29/20 for EMMI general discharge (on APL) after her discharge from [facility] ALERTforDay #4,Wednesday 05/28/20 1067frUnfilled prescriptions. This EMMI was addressed on 06/03/20 when was reach via phone [facility] CM [facility], Dr KRalene Muskratalsoreceived a pharmacy referral for Lantus and Spiriva from the patient's MD officein June 2021  Mrs SGeiselhad been sent patient assistance applications to her home address for Spiriva  Insurance:[employer] medicare [facility] admissions x4ED visits x3in the last 6 months Last admission on7/26/21 to 07/24/20 for acute blood loss anemia/ EGD - sent to ED for bleeding, generalized weakness, dark stools for a couple weeks Hgb 6.2 06/12/20-06/15/20 for GI bleeding secondary to angiodysplastic lesion 05/20/20 to [place]23/[place] 05/15[place]20 to 05/16/20 CAD chest pain  Patient is able to verify HIPAA ([place] Reviewed and addressed the purpose of the follow up call with the patient  [facility] CM [facility] with patient. Patient gave verbal consent for services.     Follow up on care coordination  Medication assistance - Mrs SRowestates she still hs not received patient assistance forms. Her address verified again  Pt encouraged to outreach to [place]  Reports she went online to get forms for her supplemental security income (SSI) and retirement   Iron level/infusion Her primary care provider (PCP) completed labs per pt  and her Hgb "was ten" Continues to have some bleeding with her anal fissure   Covid - pt quarantine is completed for contact with a suspected positive  covid pt She confirms she has not developed any covid symptoms She reports feeling well on today 09/22/20  s/s weakness is reported by her to be related to frequent stools/bowel elimination       Social:Mrs SSalimatou Simoneis a 74year old female who has support of her familyHer husband has dementia (she the primary caregiver) She has support of her daughterand son She lives in a mobile home and denies care needs or transportation needs at this time  ConditionsGastrointestinal (GI)bleeding, Iron deficiency Anemia for chronic blood loss, aortic stenosis,AVM (arteriovenous malformation) of small bowel, acquired with hemorrhageCoronary Artery Disease (CAD) s/p PCI in May 15 2020 with drug eluting stent,Chronic obstructive pulmonary disease (COPD),Hypertension (HTN), peptic ulcer disease (PUD),Hyperlipidemia (HLD),Diabetes (DM) type 2(05/21/20 HgA1c 7.3),cerebrovascular accident (CVA),peripheral vascular disease (PVD), multinodular goiter,history (hx)of multiple rib fractures, snoring, chronic fatigue obesity, former smoker incontinence of bowel and bladder Falls 2+ no injury- fell 4 months ago on her steps and twisted her ankle- he son has fixed the step to her mobile home Entry in the mobile home has 7-8 steps on the porch. DMEglucometercane glasses, CPAP   Appointments Infusions on 09/25/20, 09/26/20  09/25/20 oncology 09/26/20 Dr TLilian KapurOncology 10/01/20 lab 10/08/20 endocrinology Dr TMee Hives10/15/21 Cardiology Dr FUbaldo Glassing2/25/22 Dr JLeotis Painvascular surgeon  Plan: [facility]RN CM willoutreach to Mrs Somerswithin the next30 business days Pt encouraged to return a call to TSt[facility]RN CM prn Goals Addressed              This Visit's Progress     Patient Stated   .  Patient will be able to manage her anemia, DM, HTN and  CAD at home (pt-stated)   On track     [place] (see longtitudinal plan of care for additional care plan information)  Objective:   . Last practice recorded BP readings:  BP Readings from Last 3 Encounters:  08/20/20 (!) 117/54  08/13/20 116/71  08/05/20 (!) 120/54 .   Marland Kitchen Most recent eGFR/CrCl: No results found for: EGFR  No components found for: CRCL  Hemoglobin & Hematocrit     Component Value Date/Time   HGB 9.8 (L) 08/04/2020 1031   HGB 7.5 (L) 07/02/2020 1553   HCT 31.5 (L) 08/04/2020 1031   HCT 35.3 12/17/2014 1222      Current Barriers:  Marland Kitchen Knowledge deficit related to self care management of hypertension . Cognitive Deficits  Case Manager Clinical Goal(s):  Marland Kitchen Over the next 31 days, patient will not experience hospital admission. Hospital Admissions in last 6 months = 4 . Over the next 45 days, patient will attend all scheduled medical appointments: pcp, specialists . over the next 90 days patient will be able to verbalize with outreach interventions to manage GIB, HTN, DM,  09/22/20 progression of goals- Last discharge on 07/24/20 No hospital admission goal met in last 31 days. Progressing with attendance of scheduled appointments  She is taking in prune juice to assist with iron and reports taking medicines as ordered  Interventions:  Assessed for worsening symptoms related to CAD, HTN, DM . Evaluation of current treatment plan related to hypertension self management and patient's adherence to plan as established by provider. . Reviewed medications with patient and discussed importance of compliance . Discussed plans with patient for ongoing care management follow up and provided patient with direct contact information for care management team . Discussed signs and symptoms of hypertension, DM, GIB/anemia . Reviewed MD/lab visits . Re evaluate EMMI concern for resolution . Re evaluate for care coordination needs . Provide education of home management of anemia management . Encouraged outreach to MD if continue with micro bleeding episodes  . Collaborate with pharmacy related to medication  assistance forms  . Assessed for covid s/s   Patient Self Care Activities:  . Self administers medications as prescribed . Attends all scheduled provider appointments . Calls provider office for new concerns, questions, or BP outside discussed parameters . Adheres to a low sodium diet/DASH diet . Increase physical activity as tolerated . Verbalize where to go to receive medical care . Got to get labs to monitor iron level to see if she needs further iron infusions . Monitors BP and Cbg values   Please see past updates related to this goal by clicking on the "Past Updates" button in the selected goal  Please see other previous [facility] care plan information listed in Epic under the flow sheet section          Tiya Schrupp L. [name], RN, BSN, [place] Coordinator Office number [phone] Main [facility] number [phone] Fax number [phone]

## 2020-09-24 ENCOUNTER — Other Ambulatory Visit: Payer: Self-pay

## 2020-09-24 DIAGNOSIS — D500 Iron deficiency anemia secondary to blood loss (chronic): Secondary | ICD-10-CM

## 2020-09-25 ENCOUNTER — Other Ambulatory Visit: Payer: Self-pay

## 2020-09-25 ENCOUNTER — Inpatient Hospital Stay: Payer: Medicare HMO | Attending: Oncology

## 2020-09-25 ENCOUNTER — Encounter: Payer: Self-pay | Admitting: Oncology

## 2020-09-25 DIAGNOSIS — D500 Iron deficiency anemia secondary to blood loss (chronic): Secondary | ICD-10-CM | POA: Insufficient documentation

## 2020-09-25 DIAGNOSIS — K921 Melena: Secondary | ICD-10-CM | POA: Diagnosis not present

## 2020-09-25 LAB — CBC WITH DIFFERENTIAL/PLATELET
Abs Immature Granulocytes: 0.01 10*3/uL (ref 0.00–0.07)
Basophils Absolute: 0 10*3/uL (ref 0.0–0.1)
Basophils Relative: 1 %
Eosinophils Absolute: 0.1 10*3/uL (ref 0.0–0.5)
Eosinophils Relative: 1 %
HCT: 27.8 % — ABNORMAL LOW (ref 36.0–46.0)
Hemoglobin: 8.7 g/dL — ABNORMAL LOW (ref 12.0–15.0)
Immature Granulocytes: 0 %
Lymphocytes Relative: 24 %
Lymphs Abs: 1.2 10*3/uL (ref 0.7–4.0)
MCH: 26.3 pg (ref 26.0–34.0)
MCHC: 31.3 g/dL (ref 30.0–36.0)
MCV: 84 fL (ref 80.0–100.0)
Monocytes Absolute: 0.6 10*3/uL (ref 0.1–1.0)
Monocytes Relative: 12 %
Neutro Abs: 3 10*3/uL (ref 1.7–7.7)
Neutrophils Relative %: 62 %
Platelets: 311 10*3/uL (ref 150–400)
RBC: 3.31 MIL/uL — ABNORMAL LOW (ref 3.87–5.11)
RDW: 18.5 % — ABNORMAL HIGH (ref 11.5–15.5)
WBC: 4.9 10*3/uL (ref 4.0–10.5)
nRBC: 0 % (ref 0.0–0.2)

## 2020-09-25 LAB — IRON AND TIBC
Iron: 25 ug/dL — ABNORMAL LOW (ref 28–170)
Saturation Ratios: 8 % — ABNORMAL LOW (ref 10.4–31.8)
TIBC: 307 ug/dL (ref 250–450)
UIBC: 282 ug/dL

## 2020-09-25 LAB — FERRITIN: Ferritin: 19 ng/mL (ref 11–307)

## 2020-09-25 NOTE — Progress Notes (Signed)
Patient denies any pain or concerns. States she feels good and only has problems with heart rate and shortness of breath.

## 2020-09-26 ENCOUNTER — Inpatient Hospital Stay: Payer: Medicare HMO

## 2020-09-26 ENCOUNTER — Inpatient Hospital Stay: Payer: Medicare HMO | Attending: Oncology | Admitting: Oncology

## 2020-09-26 VITALS — BP 124/62 | HR 60

## 2020-09-26 VITALS — BP 139/53 | HR 62 | Temp 98.0°F | Resp 20 | Wt 191.4 lb

## 2020-09-26 DIAGNOSIS — D500 Iron deficiency anemia secondary to blood loss (chronic): Secondary | ICD-10-CM

## 2020-09-26 DIAGNOSIS — D509 Iron deficiency anemia, unspecified: Secondary | ICD-10-CM | POA: Insufficient documentation

## 2020-09-26 MED ORDER — SODIUM CHLORIDE 0.9 % IV SOLN
INTRAVENOUS | Status: DC
  Filled 2020-09-26: qty 250

## 2020-09-26 MED ORDER — IRON SUCROSE 20 MG/ML IV SOLN
200.0000 mg | Freq: Once | INTRAVENOUS | Status: AC
  Administered 2020-09-26: 200 mg via INTRAVENOUS
  Filled 2020-09-26: qty 10

## 2020-09-26 MED ORDER — SODIUM CHLORIDE 0.9 % IV SOLN: 200.0000 mg | Freq: Once | INTRAVENOUS | Status: DC

## 2020-10-02 ENCOUNTER — Other Ambulatory Visit: Payer: Self-pay

## 2020-10-02 ENCOUNTER — Inpatient Hospital Stay: Payer: Medicare HMO

## 2020-10-02 VITALS — BP 138/69 | HR 52 | Temp 98.4°F | Resp 16

## 2020-10-02 DIAGNOSIS — D509 Iron deficiency anemia, unspecified: Secondary | ICD-10-CM

## 2020-10-02 DIAGNOSIS — E1142 Type 2 diabetes mellitus with diabetic polyneuropathy: Secondary | ICD-10-CM | POA: Diagnosis not present

## 2020-10-02 DIAGNOSIS — E1169 Type 2 diabetes mellitus with other specified complication: Secondary | ICD-10-CM | POA: Diagnosis not present

## 2020-10-02 DIAGNOSIS — E1159 Type 2 diabetes mellitus with other circulatory complications: Secondary | ICD-10-CM | POA: Diagnosis not present

## 2020-10-02 DIAGNOSIS — E669 Obesity, unspecified: Secondary | ICD-10-CM | POA: Diagnosis not present

## 2020-10-02 DIAGNOSIS — D500 Iron deficiency anemia secondary to blood loss (chronic): Secondary | ICD-10-CM

## 2020-10-02 MED ORDER — IRON SUCROSE 20 MG/ML IV SOLN
200.0000 mg | Freq: Once | INTRAVENOUS | Status: AC
  Administered 2020-10-02: 200 mg via INTRAVENOUS
  Filled 2020-10-02: qty 10

## 2020-10-02 MED ORDER — SODIUM CHLORIDE 0.9 % IV SOLN
INTRAVENOUS | Status: DC
  Filled 2020-10-02: qty 250

## 2020-10-02 MED ORDER — SODIUM CHLORIDE 0.9 % IV SOLN
200.0000 mg | Freq: Once | INTRAVENOUS | Status: DC
  Filled 2020-10-02: qty 10

## 2020-10-08 ENCOUNTER — Telehealth: Payer: Self-pay

## 2020-10-08 ENCOUNTER — Telehealth (INDEPENDENT_AMBULATORY_CARE_PROVIDER_SITE_OTHER): Payer: Medicare HMO | Admitting: Gastroenterology

## 2020-10-08 DIAGNOSIS — E785 Hyperlipidemia, unspecified: Secondary | ICD-10-CM | POA: Diagnosis not present

## 2020-10-08 DIAGNOSIS — E1129 Type 2 diabetes mellitus with other diabetic kidney complication: Secondary | ICD-10-CM | POA: Diagnosis not present

## 2020-10-08 DIAGNOSIS — Z794 Long term (current) use of insulin: Secondary | ICD-10-CM | POA: Diagnosis not present

## 2020-10-08 DIAGNOSIS — E669 Obesity, unspecified: Secondary | ICD-10-CM | POA: Diagnosis not present

## 2020-10-08 DIAGNOSIS — R809 Proteinuria, unspecified: Secondary | ICD-10-CM | POA: Diagnosis not present

## 2020-10-08 DIAGNOSIS — D500 Iron deficiency anemia secondary to blood loss (chronic): Secondary | ICD-10-CM | POA: Diagnosis not present

## 2020-10-08 DIAGNOSIS — E1142 Type 2 diabetes mellitus with diabetic polyneuropathy: Secondary | ICD-10-CM | POA: Diagnosis not present

## 2020-10-08 DIAGNOSIS — E1169 Type 2 diabetes mellitus with other specified complication: Secondary | ICD-10-CM | POA: Diagnosis not present

## 2020-10-08 DIAGNOSIS — E1159 Type 2 diabetes mellitus with other circulatory complications: Secondary | ICD-10-CM | POA: Diagnosis not present

## 2020-10-08 DIAGNOSIS — I10 Essential (primary) hypertension: Secondary | ICD-10-CM | POA: Diagnosis not present

## 2020-10-08 DIAGNOSIS — M810 Age-related osteoporosis without current pathological fracture: Secondary | ICD-10-CM | POA: Diagnosis not present

## 2020-10-08 NOTE — Telephone Encounter (Signed)
Per Dr. Michele Mcalpine secure chat:please reach out to her PCP and tell them we recommend ENT referral due to nose bleeds. Called Dr. Laurelyn Sickle office and had to leave a voicemail letting them know that Dr. Bonna Gains is recommending for patient to be referred to an ENT for her nose bleeds. I left my information in case they have further questions.

## 2020-10-08 NOTE — Patient Instructions (Signed)
Please come to our office 2 days prior to your appointment to get you lab drawn.

## 2020-10-09 NOTE — Progress Notes (Signed)
[name], MD [address]  [place]  [place], [place] [address]  Main: [phone]  Fax: [phone]   Primary Care Physician: [name], MD  Virtual Visit via Telephone Note  I connected with patient on 10/09/20 at  1:15 PM EDT by telephone and verified that I am speaking with the correct person using two identifiers.   I discussed the limitations, risks, security and privacy concerns of performing an evaluation and management service by telephone and the availability of in person appointments. I also discussed with the patient that there may be a patient responsible charge related to this service. The patient expressed understanding and agreed to proceed.  Location of Patient: Home Location of Provider: Home Persons involved: Patient and provider only during the visit (nursing staff and front desk staff was involved in communicating with the patient prior to the appointment, reviewing medications and checking them in)   History of Present Illness: Chief Complaint  Patient presents with  . IDA     HPI: Joyce Potter is a 74 y.o. female with history of iron deficiency anemia secondary to AVMs here for follow-up.  Patient denies any recent melena, or episodes of GI bleed.  Does report 2 episodes of epistaxis within the last few months, last episode about 1 to 2 weeks ago.  States she saw about a teaspoon of blood on the last episode.  Her hemoglobin continues to be low and she is receiving iron replacement with hematology  Current Outpatient Medications  Medication Sig Dispense Refill  . albuterol (PROAIR HFA) 108 (90 Base) MCG/ACT inhaler Inhale 2 puffs into the lungs every 4 (four) hours as needed for wheezing or shortness of breath.     Marland Kitchen alendronate (FOSAMAX) 70 MG tablet Take 70 mg by mouth once a week.     Marland Kitchen amitriptyline (ELAVIL) 25 MG tablet Take 25 mg by mouth at bedtime as needed for sleep.     Marland Kitchen aspirin EC 81 MG EC tablet Take 1 tablet (81 mg  total) by mouth daily. Swallow whole. 30 tablet 11  . clopidogrel (PLAVIX) 75 MG tablet Take 1 tablet (75 mg total) by mouth daily with breakfast. 30 tablet 11  . Dulaglutide 1.5 MG/0.5ML SOPN Inject 1.5 mg into the skin every Saturday.     . DULoxetine (CYMBALTA) 20 MG capsule Take 20 mg by mouth 2 (two) times daily.    Marland Kitchen gabapentin (NEURONTIN) 300 MG capsule Take 300 mg by mouth 2 (two) times daily.     . insulin NPH-regular Human (NOVOLIN 70/30) (70-30) 100 UNIT/ML injection Inject 30 Units into the skin 2 (two) times daily with a meal.     . isosorbide mononitrate (IMDUR) 30 MG 24 hr tablet Take 30 mg by mouth daily.     Marland Kitchen lisinopril (ZESTRIL) 10 MG tablet Take 10 mg by mouth daily.    . metoprolol tartrate (LOPRESSOR) 100 MG tablet Take 150 mg by mouth 2 (two) times daily.     Glory Rosebush VERIO test strip SMARTSIG:Via Meter    . pantoprazole (PROTONIX) 40 MG tablet Take 40 mg by mouth daily.    . pravastatin (PRAVACHOL) 20 MG tablet Take 20 mg by mouth at bedtime.    . saccharomyces boulardii (FLORASTOR) 250 MG capsule Take 250 mg by mouth 2 (two) times daily.     Marland Kitchen SPIRIVA HANDIHALER 18 MCG inhalation capsule Place 18 mcg into inhaler and inhale daily in the afternoon.     Marland Kitchen  traMADol (ULTRAM) 50 MG tablet Take 50 mg by mouth every 6 (six) hours as needed for moderate pain.     Marland Kitchen umeclidinium bromide (INCRUSE ELLIPTA) 62.5 MCG/INH AEPB Inhale 1 puff into the lungs daily.     No current facility-administered medications for this visit.   Facility-Administered Medications Ordered in Other Visits  Medication Dose Route Frequency Provider Last Rate Last Admin  . heparin lock flush 100 unit/mL  500 Units Intravenous Once Verlon Au, NP      . sodium chloride flush (NS) 0.9 % injection 10 mL  10 mL Intravenous Once Verlon Au, NP        Allergies as of 10/08/2020 - Review Complete 10/08/2020  Allergen Reaction Noted  . Sulfa antibiotics Other (See Comments) 04/13/2013  . Invokamet  [canagliflozin-metformin hcl] Other (See Comments) 08/08/2015  . Lovastatin Rash 08/08/2015  . Penicillins Rash 04/13/2013  . Vicodin [hydrocodone-acetaminophen] Rash 04/13/2013    Review of Systems:    All systems reviewed and negative except where noted in HPI.   Observations/Objective:  Labs: CMP     Component Value Date/Time   NA 141 07/24/2020 0417   NA 143 05/04/2013 0614   K 4.1 07/24/2020 0417   K 3.8 05/04/2013 0614   CL 104 07/24/2020 0417   CL 110 (H) 05/04/2013 0614   CO2 26 07/24/2020 0417   CO2 27 05/04/2013 0614   GLUCOSE 102 (H) 07/24/2020 0417   GLUCOSE 130 (H) 05/04/2013 0614   BUN 8 07/24/2020 0417   BUN 12 05/04/2013 0614   CREATININE 0.79 07/24/2020 0417   CREATININE 0.79 08/27/2014 1445   CALCIUM 8.5 (L) 07/24/2020 0417   CALCIUM 8.2 (L) 05/04/2013 0614   PROT 7.0 07/21/2020 1011   PROT 7.6 08/27/2014 1445   ALBUMIN 3.8 07/21/2020 1011   ALBUMIN 3.5 08/27/2014 1445   AST 18 07/21/2020 1011   AST 27 08/27/2014 1445   ALT 14 07/21/2020 1011   ALT 27 08/27/2014 1445   ALKPHOS 72 07/21/2020 1011   ALKPHOS 101 08/27/2014 1445   BILITOT 0.6 07/21/2020 1011   BILITOT 0.4 08/27/2014 1445   GFRNONAA >60 07/24/2020 0417   GFRNONAA >60 08/27/2014 1445   GFRAA >60 07/24/2020 0417   GFRAA >60 08/27/2014 1445   Lab Results  Component Value Date   WBC 4.9 09/25/2020   HGB 8.7 (L) 09/25/2020   HCT 27.8 (L) 09/25/2020   MCV 84.0 09/25/2020   PLT 311 09/25/2020    Imaging Studies: No results found.  Assessment and Plan:   Joyce Potter is a 74 y.o. y/o female with iron deficiency anemia secondary to AVMs here for follow-up  Assessment and Plan: We will repeat hemoglobin in 3 to 4 weeks to see how she is responding to her iron replacement  In the meantime, I have asked her to speak to her PCP about ENT referral given that she has had 2 episodes of epistaxis and this may be contributing to her anemia.  My staff will contact the PCP office with  these recommendations as well  If hemoglobin continues to be low, we may consider repeat capsule study given her previous history of AVMs and positive capsule study.  However, she has already had enteroscopy since the capsule study with ablation of AVMs.  No indication for urgent intervention at this time  Patient advised to call us if she notices any melena, or any other evidence of GI bleeding and she verbalized understanding  Follow Up Instructions:  I discussed the assessment and treatment plan with the patient. The patient was provided an opportunity to ask questions and all were answered. The patient agreed with the plan and demonstrated an understanding of the instructions.   The patient was advised to call back or seek an in-person evaluation if the symptoms worsen or if the condition fails to improve as anticipated.  I provided 15 minutes of non-face-to-face time during this encounter. Additional time was spent in reviewing patient's chart, placing orders etc.   Virgel Manifold, MD  Speech recognition software was used to dictate this note.

## 2020-10-10 ENCOUNTER — Inpatient Hospital Stay: Payer: Medicare HMO

## 2020-10-10 ENCOUNTER — Other Ambulatory Visit: Payer: Self-pay

## 2020-10-10 VITALS — BP 140/59 | HR 56 | Resp 18

## 2020-10-10 DIAGNOSIS — D509 Iron deficiency anemia, unspecified: Secondary | ICD-10-CM

## 2020-10-10 DIAGNOSIS — I2510 Atherosclerotic heart disease of native coronary artery without angina pectoris: Secondary | ICD-10-CM | POA: Diagnosis not present

## 2020-10-10 DIAGNOSIS — D500 Iron deficiency anemia secondary to blood loss (chronic): Secondary | ICD-10-CM

## 2020-10-10 DIAGNOSIS — I350 Nonrheumatic aortic (valve) stenosis: Secondary | ICD-10-CM | POA: Diagnosis not present

## 2020-10-10 DIAGNOSIS — I700 Atherosclerosis of aorta: Secondary | ICD-10-CM | POA: Diagnosis not present

## 2020-10-10 DIAGNOSIS — I10 Essential (primary) hypertension: Secondary | ICD-10-CM | POA: Diagnosis not present

## 2020-10-10 MED ORDER — IRON SUCROSE 20 MG/ML IV SOLN
200.0000 mg | Freq: Once | INTRAVENOUS | Status: AC
  Administered 2020-10-10: 200 mg via INTRAVENOUS
  Filled 2020-10-10: qty 10

## 2020-10-10 MED ORDER — SODIUM CHLORIDE 0.9 % IV SOLN
INTRAVENOUS | Status: DC
  Filled 2020-10-10: qty 250

## 2020-10-10 MED ORDER — SODIUM CHLORIDE 0.9 % IV SOLN: 200.0000 mg | Freq: Once | INTRAVENOUS | Status: DC

## 2020-10-13 DIAGNOSIS — G4733 Obstructive sleep apnea (adult) (pediatric): Secondary | ICD-10-CM | POA: Diagnosis not present

## 2020-10-21 ENCOUNTER — Other Ambulatory Visit: Payer: Self-pay | Admitting: *Deleted

## 2020-10-21 NOTE — Patient Outreach (Signed)
Joyce Potter Joyce Potter Joyce Potter Joyce Potter Joyce Potter Joyce Potter Joyce Potter Joyce Potter Joyce Potter Joyce Potter) Care Management  10/21/2020  Joyce Potter [birth year] [id number]  [facility] outreach from complex care patient Joyce Potter was referred to Joyce Potter on 05/29/20 for EMMI general discharge (on APL) after her discharge from [place] ALERTforDay #4,Wednesday 05/28/20 1079frUnfilled prescriptions. This EMMI was addressed on 06/03/20 when was reach via phone Joyce Potter consulted with Joyce Potter staff, Joyce Potter a pharmacy referral for Lantus and Spiriva from the patient's MD officein June 2021 Joyce Potter been sent patient assistance applications to her home address for Spiriva  Insurance:[employer]na medicare [facility] admissions x4ED visits x3in the last 6 months Last admission on7/26/21 to 07/24/20 for acute blood loss anemia/ EGD - sent to ED for bleeding, generalized weakness, dark stools for a couple weeks Hgb 6.2 06/12/20-06/15/20 for GI bleeding secondary to angiodysplastic lesion 05/20/20 to 05/23/20 AOlivetbleeding and anemia 05/15/20 to 05/16/20 CAD chest pain  Patient is able to verify HIPAA ([place] Consent: [facility] [place] RN Potter reviewed Joyce Potter patient. Patient gave verbal consent for services  Outreach from pt related to medication assistance issue Joyce SIgoereports she received her Spiriva but not the insulin, Joyce Potter 70/30 She received a letter on today 0/26/21 dated for 10/17/20 from the [place]icating there were missing or incomplete information The contact number for Joyce Potter is [phone] Joyce SBaldonadoinquired about Medicare part D and Medicare education completed on Medicare parts A, B %& D She voiced understanding and is aware she has part D  She read the letter to [place] about the need to pay $1000 "up front "  [facility] explained to her that Joyce Potter was explaining to her  what would be needed to qualify if the application w Conference with pt to NTime [id number] with an attempt to find out what may be missing from her application.  She reports calling Joyce Potter on 10/20/20 and waited 45 minutes While on hold [facility] RN Potter outreached to Joyce Potter [employer] [employer] staff. Discussed Joyce STamuraconcern with Joyce Pimplewho advised Joyce Potter disregard the letter dated 10/17/20 as Joyce Pimplespoke with NTime Warnerstaff on 10/20/20. Joyce SReverewas approved for Joyce Potter 70/30. It will be delivered to her MD office in the next 10-14 business days  Joyce SMeaderswas provided Joyce Potter's contact number   Medical concerns Joyce SMogensenconfirms she continues to have infusions but is noting her hemoglobin decreasing She reports being seen monthly, last seen 10/10/20 She reports her hemoglobin went down from 10.6 to 8.4  She is scheduled to follow up with Joyce Potter gastroenterologist on 11/13/20    Social:Joyce Potter a 74year old female who has support of her familyHer husband has dementia (she the primary caregiver) She has support of her daughterand son She lives in a mobile home and denies care needs or transportation needs at this Joyce  ConditionsGastrointestinal (GI)bleeding, Iron deficiency Anemia for chronic blood loss, aortic stenosis,AVM (arteriovenous malformation) of small bowel, acquired with hemorrhageCoronary Artery Disease (CAD) s/p PCI in May 15 2020 with drug eluting stent,Chronic obstructive pulmonary disease (COPD),Hypertension (HTN), peptic ulcer disease (PUD),Hyperlipidemia (HLD),Diabetes (DM) type 2(05/21/20 HgA1c 7.3),cerebrovascular accident (CVA),peripheral vascular disease (PVD), multinodular goiter,history (hx)of multiple rib fractures, snoring, chronic fatigue obesity, former smoker incontinence of bowel and bladder Falls 2+ no injury- fell 4 months ago on her steps and twisted her ankle- he  son has fixed the step to her mobile home Entry in the mobile  home has 7-8 steps on the porch. DMEglucometercane glasses, CPAP Vaccinations She had both covid vaccinations She has a history of being quarantined for exposure but did not have a positive covid test    Plan: [facility] RN Potter willoutreach to Joyce Somerswithin the next30 business days Pt encouraged to return a call to Joyce Potter prn  Goals Addressed              This Visit's Progress     Patient Stated   .  ([facility]) Anemia Prevented or Managed (pt-stated)   On track     Evidence-based guidance:  Monitor efficacy of treatment plan by review of signs and symptoms, and lab studies.  Anticipate further diagnostic testing that may include screening for celiac disease, gastroscopy, colonoscopy, video capsule endoscopy, small bowel MRI (magnetic resonance imaging) or CT (computerized tomography).   Promote adherence to vitamin and/or mineral supplementation such as taking iron supplement daily 1 or 2 hours before or after meals with Vitamin C-rich juice/food and without milk.  Provide individualized medical nutrition therapy.   Promote use of iron-fortified foods, such as ready-to-eat or cooked cereals and use of iron cooking pots.  Identify ways to alleviate barriers to treatment, including food insecurity, lack of potable water, poor sanitation, side effects of vitamin and mineral supplements, lack of education, and food preferences.  Monitor for constipation; encourage increased fluid and fiber intake, and use of stool softeners or laxatives.  If patient has inadequate response to oral therapy or co-morbidities (e.g., cardiac instability; dialysis; celiac or irritable bowel disease), anticipate/prepare patient for intravenous iron.  Encourage disclosure of pica-type behaviors; if present, acknowledge and validate physiological reasons for cravings and correct nutritional deficiencies.  Promote use of behavioral strategies that may include sensory reinforcement and training to discriminate  between edible/inedible items to treat pica.  Anticipate/prepare patient for blood transfusion if symptoms are present or if hemoglobin is significantly diminished.   Notes:     .  [place] Joyce Potter) Learn More About My Health (pt-stated)   On track     Follow Up Date 11/24/20   - ask questions - speak up when I don't understand    Why is this important?   The best way to learn about your health and care is by talking to the doctor and nurse.  They will answer your questions and give you information in the way that you like best.    Notes:     .  [facility] Joyce Potter) Manage My Medicine (pt-stated)   On track     Follow Up Date 11/24/20   - keep a list of all the medicines I take; vitamins and herbals too    Why is this important?   These steps will help you keep on track with your medicines.    Notes:     .  COMPLETED: Patient will be able to manage her anemia, DM, HTN and CAD at home (pt-stated)   On track     [place] (see longtitudinal plan of care for additional care plan information)  Objective:  . Last practice recorded BP readings:  BP Readings from Last 3 Encounters:  10/10/20 (!) 140/59  10/02/20 138/69  09/26/20 124/62 .   Marland Kitchen Most recent eGFR/CrCl: No results found for: EGFR  No components found for: CRCL  Hemoglobin & Hematocrit     Component Value Date/Joyce   HGB 8.7 (L) 09/25/2020 1410  HGB 7.5 (L) 07/02/2020 1553   HCT 27.8 (L) 09/25/2020 1410   HCT 35.3 12/17/2014 1222      Current Barriers:  Marland Kitchen Knowledge deficit related to self care management of hypertension . Cognitive Deficits  Case Manager Clinical Goal(s):  Marland Kitchen Over the next 31 days, patient will not experience Joyce Potter admission. Joyce Potter Admissions in last 6 months = 4 . Over the next 45 days, patient will attend all scheduled medical appointments: pcp, specialists . over the next 90 days patient will be able to verbalize with outreach interventions to manage GIB, HTN, DM,  09/22/20 progression of goals- Last  discharge on 07/24/20 No Joyce Potter admission goal met in last 31 days. Progressing with attendance of scheduled appointments  She is taking in prune juice to assist with iron and reports taking medicines as ordered 10/21/20 Resolving due to duplicate goal    Interventions:  Assessed for worsening symptoms related to CAD, HTN, DM . Evaluation of current treatment plan related to hypertension self management and patient's adherence to plan as established by provider. . Reviewed medications with patient and discussed importance of compliance . Discussed plans with patient for ongoing care management follow up and provided patient with direct contact information for care management team . Discussed signs and symptoms of hypertension, DM, GIB/anemia . Reviewed MD/lab visits . Re evaluate EMMI concern for resolution . Re evaluate for care coordination needs . Provide education of home management of anemia management . Encouraged outreach to MD if continue with micro bleeding episodes  . Collaborate with pharmacy related to medication assistance forms  . Assessed for covid s/s   Patient Self Care Activities:  . Self administers medications as prescribed . Attends all scheduled provider appointments . Calls provider office for new concerns, questions, or BP outside discussed parameters . Adheres to a low sodium diet/DASH diet . Increase physical activity as tolerated . Verbalize where to go to receive medical care . Got to get labs to monitor iron level to see if she needs further iron infusions . Monitors BP and Cbg values   Please see past updates related to this goal by clicking on the "Past Updates" button in the selected goal  Please see other previous [facility] care plan information listed in Epic under the flow sheet section          Nycholas Rayner L. [name], RN, BSN, [place] Coordinator Office number [phone] Main [facility] [place] number [phone] Fax number  [phone]

## 2020-10-22 ENCOUNTER — Ambulatory Visit: Payer: Self-pay | Admitting: *Deleted

## 2020-10-22 DIAGNOSIS — R040 Epistaxis: Secondary | ICD-10-CM | POA: Diagnosis not present

## 2020-10-22 DIAGNOSIS — E041 Nontoxic single thyroid nodule: Secondary | ICD-10-CM | POA: Diagnosis not present

## 2020-11-06 ENCOUNTER — Other Ambulatory Visit: Payer: Medicare HMO

## 2020-11-07 ENCOUNTER — Ambulatory Visit: Payer: Medicare HMO | Admitting: Oncology

## 2020-11-07 ENCOUNTER — Ambulatory Visit: Payer: Medicare HMO

## 2020-11-13 ENCOUNTER — Encounter: Payer: Self-pay | Admitting: Gastroenterology

## 2020-11-13 ENCOUNTER — Ambulatory Visit: Payer: Medicare HMO | Admitting: Gastroenterology

## 2020-11-13 ENCOUNTER — Other Ambulatory Visit: Payer: Self-pay

## 2020-11-13 VITALS — BP 134/74 | HR 82 | Temp 98.5°F | Wt 193.0 lb

## 2020-11-13 DIAGNOSIS — D509 Iron deficiency anemia, unspecified: Secondary | ICD-10-CM

## 2020-11-13 DIAGNOSIS — R197 Diarrhea, unspecified: Secondary | ICD-10-CM | POA: Diagnosis not present

## 2020-11-13 DIAGNOSIS — G4733 Obstructive sleep apnea (adult) (pediatric): Secondary | ICD-10-CM | POA: Diagnosis not present

## 2020-11-13 MED ORDER — PANTOPRAZOLE SODIUM 40 MG PO TBEC
40.0000 mg | DELAYED_RELEASE_TABLET | Freq: Two times a day (BID) | ORAL | 0 refills | Status: DC
Start: 2020-11-13 — End: 2021-03-05

## 2020-11-13 NOTE — Patient Instructions (Signed)
Please increase your

## 2020-11-14 LAB — HEMOGLOBIN: Hemoglobin: 9.3 g/dL — ABNORMAL LOW (ref 11.1–15.9)

## 2020-11-14 NOTE — Progress Notes (Signed)
[name], MD [address]  [place]  [place], [place] [address]  Main: [phone]  Fax: [phone]   Primary Care Physician: [name], MD   Chief Complaint  Patient presents with  . IDA    HPI: Joyce Potter is a 74 y.o. female here for follow-up of iron deficiency anemia.  Patient does report seeing black stool 2-3 times last week.  This has since resolved.  No nausea or vomiting.  No hematemesis.  Is also reporting heartburn daily despite taking Protonix once a day.  No weight loss.  No abdominal pain  Current Outpatient Medications  Medication Sig Dispense Refill  . albuterol (PROAIR HFA) 108 (90 Base) MCG/ACT inhaler Inhale 2 puffs into the lungs every 4 (four) hours as needed for wheezing or shortness of breath.     Marland Kitchen alendronate (FOSAMAX) 70 MG tablet Take 70 mg by mouth once a week.     Marland Kitchen amitriptyline (ELAVIL) 25 MG tablet Take 25 mg by mouth at bedtime as needed for sleep.     Marland Kitchen aspirin EC 81 MG EC tablet Take 1 tablet (81 mg total) by mouth daily. Swallow whole. 30 tablet 11  . atorvastatin (LIPITOR) 20 MG tablet Take by mouth.    Marland Kitchen atorvastatin (LIPITOR) 20 MG tablet Take 20 mg by mouth daily.    . clopidogrel (PLAVIX) 75 MG tablet Take 1 tablet (75 mg total) by mouth daily with breakfast. 30 tablet 11  . Dulaglutide 1.5 MG/0.5ML SOPN Inject 1.5 mg into the skin every Saturday.     . DULoxetine (CYMBALTA) 20 MG capsule Take 20 mg by mouth 2 (two) times daily.    Marland Kitchen gabapentin (NEURONTIN) 300 MG capsule Take 300 mg by mouth 2 (two) times daily.     . insulin NPH-regular Human (NOVOLIN 70/30) (70-30) 100 UNIT/ML injection Inject 30 Units into the skin 2 (two) times daily with a meal.     . isosorbide mononitrate (IMDUR) 30 MG 24 hr tablet Take 30 mg by mouth daily.     Marland Kitchen lisinopril (ZESTRIL) 5 MG tablet Take 5 mg by mouth daily.    . metoprolol tartrate (LOPRESSOR) 100 MG tablet Take 150 mg by mouth 2 (two) times daily.     Glory Rosebush VERIO  test strip SMARTSIG:Via Meter    . pantoprazole (PROTONIX) 40 MG tablet Take 1 tablet (40 mg total) by mouth 2 (two) times daily. 60 tablet 0  . pravastatin (PRAVACHOL) 20 MG tablet Take 20 mg by mouth at bedtime.    . saccharomyces boulardii (FLORASTOR) 250 MG capsule Take 250 mg by mouth 2 (two) times daily.     Marland Kitchen SPIRIVA HANDIHALER 18 MCG inhalation capsule Place 18 mcg into inhaler and inhale daily in the afternoon.     . traMADol (ULTRAM) 50 MG tablet Take 50 mg by mouth every 6 (six) hours as needed for moderate pain.     Marland Kitchen umeclidinium bromide (INCRUSE ELLIPTA) 62.5 MCG/INH AEPB Inhale 1 puff into the lungs daily.     No current facility-administered medications for this visit.   Facility-Administered Medications Ordered in Other Visits  Medication Dose Route Frequency Provider Last Rate Last Admin  . heparin lock flush 100 unit/mL  500 Units Intravenous Once Verlon Au, NP      . sodium chloride flush (NS) 0.9 % injection 10 mL  10 mL Intravenous Once Verlon Au, NP        Allergies as of 11/13/2020 -  Review Complete 11/13/2020  Allergen Reaction Noted  . Sulfa antibiotics Other (See Comments) 04/13/2013  . Invokamet [canagliflozin-metformin hcl] Other (See Comments) 08/08/2015  . Lovastatin Rash 08/08/2015  . Penicillins Rash 04/13/2013  . Vicodin [hydrocodone-acetaminophen] Rash 04/13/2013    ROS:  General: Negative for anorexia, weight loss, fever, chills, fatigue, weakness. ENT: Negative for hoarseness, difficulty swallowing , nasal congestion. CV: Negative for chest pain, angina, palpitations, dyspnea on exertion, peripheral edema.  Respiratory: Negative for dyspnea at rest, dyspnea on exertion, cough, sputum, wheezing.  GI: See history of present illness. GU:  Negative for dysuria, hematuria, urinary incontinence, urinary frequency, nocturnal urination.  Endo: Negative for unusual weight change.    Physical Examination:   BP 134/74   Pulse 82   Temp 98.5  F (36.9 C) (Oral)   Wt 193 lb (87.5 kg)   BMI 34.19 kg/m   General: Well-nourished, well-developed in no acute distress.  Eyes: No icterus. Conjunctivae pink. Mouth: Oropharyngeal mucosa moist and pink , no lesions erythema or exudate. Neck: Supple, Trachea midline Abdomen: Bowel sounds are normal, nontender, nondistended, no hepatosplenomegaly or masses, no abdominal bruits or hernia , no rebound or guarding.   Extremities: No lower extremity edema. No clubbing or deformities. Neuro: Alert and oriented x 3.  Grossly intact. Skin: Warm and dry, no jaundice.   Psych: Alert and cooperative, normal mood and affect.   Labs: CMP     Component Value Date/Time   NA 141 07/24/2020 0417   NA 143 05/04/2013 0614   K 4.1 07/24/2020 0417   K 3.8 05/04/2013 0614   CL 104 07/24/2020 0417   CL 110 (H) 05/04/2013 0614   CO2 26 07/24/2020 0417   CO2 27 05/04/2013 0614   GLUCOSE 102 (H) 07/24/2020 0417   GLUCOSE 130 (H) 05/04/2013 0614   BUN 8 07/24/2020 0417   BUN 12 05/04/2013 0614   CREATININE 0.79 07/24/2020 0417   CREATININE 0.79 08/27/2014 1445   CALCIUM 8.5 (L) 07/24/2020 0417   CALCIUM 8.2 (L) 05/04/2013 0614   PROT 7.0 07/21/2020 1011   PROT 7.6 08/27/2014 1445   ALBUMIN 3.8 07/21/2020 1011   ALBUMIN 3.5 08/27/2014 1445   AST 18 07/21/2020 1011   AST 27 08/27/2014 1445   ALT 14 07/21/2020 1011   ALT 27 08/27/2014 1445   ALKPHOS 72 07/21/2020 1011   ALKPHOS 101 08/27/2014 1445   BILITOT 0.6 07/21/2020 1011   BILITOT 0.4 08/27/2014 1445   GFRNONAA >60 07/24/2020 0417   GFRNONAA >60 08/27/2014 1445   GFRAA >60 07/24/2020 0417   GFRAA >60 08/27/2014 1445    Imaging Studies: No results found.  Assessment and Plan:   Joyce Potter is a 74 y.o. y/o female here for follow-up of iron deficiency anemia  I will repeat hemoglobin at this time If it has dropped, we may need to consider repeat capsule study  Increase her PPI to twice daily due to daily breakthrough  symptoms at this time  (Risks of PPI use were discussed with patient including bone loss, C. Diff diarrhea, pneumonia, infections, CKD, electrolyte abnormalities.  Pt. Verbalizes understanding and chooses to continue the medication.)  She is also reporting loose stools.  Check GI profile to rule out infectious etiology     Dr [name]

## 2020-11-17 ENCOUNTER — Other Ambulatory Visit: Payer: Self-pay | Admitting: Gastroenterology

## 2020-11-17 DIAGNOSIS — K625 Hemorrhage of anus and rectum: Secondary | ICD-10-CM | POA: Diagnosis not present

## 2020-11-17 DIAGNOSIS — D509 Iron deficiency anemia, unspecified: Secondary | ICD-10-CM | POA: Diagnosis not present

## 2020-11-17 DIAGNOSIS — R197 Diarrhea, unspecified: Secondary | ICD-10-CM | POA: Diagnosis not present

## 2020-11-18 ENCOUNTER — Telehealth: Payer: Self-pay

## 2020-11-18 NOTE — Telephone Encounter (Signed)
Patient returning call. Patient was notified of hemoglobin results and reminded of follow up appt. FYI

## 2020-11-18 NOTE — Telephone Encounter (Signed)
-----   Message from Virgel Manifold, MD sent at 11/18/2020  9:09 AM EST ----- Herb Grays please let the patient know, her hemoglobin has improved from before.  I recommend follow-up phone visit in 4 to 6 weeks to reassess symptoms and repeat hemoglobin

## 2020-11-18 NOTE — Telephone Encounter (Signed)
Called patient and had to leave her a voicemail letting her know what her results were and Dr. Michele Mcalpine recommendations were. Patient was also scheduled to come back in 01/01/2021 for a follow up.

## 2020-11-20 LAB — GI PROFILE, STOOL, PCR

## 2020-11-24 ENCOUNTER — Other Ambulatory Visit: Payer: Self-pay | Admitting: *Deleted

## 2020-11-24 ENCOUNTER — Encounter: Payer: Self-pay | Admitting: *Deleted

## 2020-11-24 ENCOUNTER — Other Ambulatory Visit: Payer: Self-pay

## 2020-11-24 NOTE — Patient Outreach (Signed)
[facility]) Care Management  11/24/2020  Joyce Potter 06-19-1946 [id number]     [facility] outreach from complex care patient Joyce Potter was referred to [facility] on 05/29/20 for EMMI general discharge (on APL) after her discharge from [place] ALERTforDay #4,Wednesday 05/28/20 1026forUnfilled prescriptions. This EMMI was addressed on 06/03/20 when was reach via phone [facility] RN CM consulted with [facility] staff, Dr Ralene Muskrat alsoreceived a pharmacy referral for Lantus and Spiriva from the patient's MD officein June 2021 Joyce Potter had been sent patient assistance applications to her home address for Spiriva  Insurance:Aetnamedicare [facility] admissions x4ED visits x3in the last 6 months Last admission on7/26/21 to 07/24/20 for acute blood loss anemia/ EGD - sent to ED for bleeding, generalized weakness, dark stools for a couple weeks Hgb 6.2 06/12/20-06/15/20 for GI bleeding secondary to angiodysplastic lesion 05/20/20 to 05/23/20 [place] bleeding and anemia 05/15/20 to 05/16/20 CAD chest pain  Patient is able to verify HIPAA ([place]) identifiers Consent: [facility] [place]) RN CM reviewed [place] with patient. Patient gave verbal consent for services  Joyce Strack reports overall she is doing well since the last [facility] outreach Joyce Potter reports she gets outreach calls from various hospital system staff to include [facility], [facility] and [facility] health. She reports this at times gets frustrating This was discussed when [facility] RN CM went to reconcile medications with her. She confirms she has recently reconciled with another female medical provider  Medication management Joyce Potter reports she has received Novolin but reports it will be changed to Humulin 70/30 (to allow her to get free insulin via [employer])  and the MD is preferring that it be sent to the patient home versus  the MD office On today 11/24/20 she has "five" vials of Novolin at her home She has been working with [place], [place] staff who is working on her Humulin 41/96 and Trulicity application for 2229. This has been communicated with her endocrinologist Dr Roderic Scarce [facility])   Anemia She confirms seeing Dr Bonna Gains, Gastroenterology on 11/13/20  She reports her hemoglobin goes "up and down" She reports she was 8 but now 9.3  She reports the last iron infusion she received was in October 2021 She reports she is noticing "I forget a lot of stuff"  Diabetes (DM) type 2  She reports she is scheduled for another 3 month HgA1c test in January 2022  Her last 3 HgA1c values with [facility] were 10/02/20= 6.0 , 06/20/20= 5.6, 03/14/20= 8   Covid no further covid exposures and she continues to follow the recommended covid precautions Questions answered about the new reported Omicron variant   Social:Joyce Potter is a 74 year old female who has support of her familyHer husband has dementia (she the primary caregiver) She has support of her daughterand son She lives in a mobile home and denies care needs or transportation needs at this time  ConditionsGastrointestinal (GI)bleeding, Iron deficiency Anemia for chronic blood loss, aortic stenosis,AVM (arteriovenous malformation) of small bowel, acquired with hemorrhageCoronary Artery Disease (CAD) s/p PCI in May 15 2020 with drug eluting stent,Chronic obstructive pulmonary disease (COPD),Hypertension (HTN), peptic ulcer disease (PUD),Hyperlipidemia (HLD),Diabetes (DM) type 2(05/21/20 HgA1c 7.3),cerebrovascular accident (CVA),peripheral vascular disease (PVD), multinodular goiter,history (hx)of multiple rib fractures, snoring, chronic fatigue obesity, former smoker incontinence of bowel and bladder Falls 2+ no injury- fell 4 months ago on her steps and twisted her ankle- he  son has fixed the step to her mobile home Entry in the mobile  home has 7-8 steps on the porch. DMEglucometercane glasses, CPAP Vaccinations She had both covid vaccinations She has a history of being quarantined for exposure but did not have a positive covid test   Plans [facility] RN CM willoutreach to Joyce Somerswithin the next45-60 business days Pt encouraged to return a call to [place] CM prn  Goals Addressed              This Visit's Progress     Patient Stated     [facility]) Anemia Prevented or Managed (pt-stated)   On track      Notes: 11/24/20 intravenous iron not needed since September 2021  Promoted use of iron-fortified foods      [facility] Learn More About My Health (pt-stated)   On track     Follow Up Date 01/05/21   - ask questions - speak up when I don't understand     Notes:       [facility]) Manage My Medicine (pt-stated)   On track     Follow Up Date 01/05/21   - keep a list of all the medicines I take; vitamins and herbals too   Notes:       Dezzie Badilla L. [name], RN, BSN, [place] Coordinator Office number [phone] Main [facility] number [phone] Fax number [phone]

## 2020-12-12 DIAGNOSIS — E782 Mixed hyperlipidemia: Secondary | ICD-10-CM | POA: Diagnosis not present

## 2020-12-12 DIAGNOSIS — R69 Illness, unspecified: Secondary | ICD-10-CM | POA: Diagnosis not present

## 2020-12-12 DIAGNOSIS — D509 Iron deficiency anemia, unspecified: Secondary | ICD-10-CM | POA: Diagnosis not present

## 2020-12-12 DIAGNOSIS — E118 Type 2 diabetes mellitus with unspecified complications: Secondary | ICD-10-CM | POA: Diagnosis not present

## 2020-12-12 DIAGNOSIS — I10 Essential (primary) hypertension: Secondary | ICD-10-CM | POA: Diagnosis not present

## 2020-12-13 DIAGNOSIS — G4733 Obstructive sleep apnea (adult) (pediatric): Secondary | ICD-10-CM | POA: Diagnosis not present

## 2020-12-17 DIAGNOSIS — Z20822 Contact with and (suspected) exposure to covid-19: Secondary | ICD-10-CM | POA: Diagnosis not present

## 2021-01-01 ENCOUNTER — Telehealth (INDEPENDENT_AMBULATORY_CARE_PROVIDER_SITE_OTHER): Payer: Medicare HMO | Admitting: Gastroenterology

## 2021-01-01 ENCOUNTER — Encounter: Payer: Self-pay | Admitting: Gastroenterology

## 2021-01-01 ENCOUNTER — Telehealth: Payer: Self-pay | Admitting: Gastroenterology

## 2021-01-01 DIAGNOSIS — I10 Essential (primary) hypertension: Secondary | ICD-10-CM | POA: Diagnosis not present

## 2021-01-01 DIAGNOSIS — D509 Iron deficiency anemia, unspecified: Secondary | ICD-10-CM

## 2021-01-01 DIAGNOSIS — I2510 Atherosclerotic heart disease of native coronary artery without angina pectoris: Secondary | ICD-10-CM | POA: Diagnosis not present

## 2021-01-01 DIAGNOSIS — I739 Peripheral vascular disease, unspecified: Secondary | ICD-10-CM | POA: Diagnosis not present

## 2021-01-01 DIAGNOSIS — I700 Atherosclerosis of aorta: Secondary | ICD-10-CM | POA: Diagnosis not present

## 2021-01-01 DIAGNOSIS — I350 Nonrheumatic aortic (valve) stenosis: Secondary | ICD-10-CM | POA: Diagnosis not present

## 2021-01-01 DIAGNOSIS — R002 Palpitations: Secondary | ICD-10-CM | POA: Diagnosis not present

## 2021-01-01 DIAGNOSIS — G4733 Obstructive sleep apnea (adult) (pediatric): Secondary | ICD-10-CM | POA: Diagnosis not present

## 2021-01-01 NOTE — Progress Notes (Signed)
Melodie BouillonVarnita Stepfanie Yott, MD [address]  [address]  [place], [place] [address]  Main: [phone]  Fax: [phone]   Primary Care Physician: Margaretann LovelessKhan, Neelam S, MD  Virtual Visit via Telephone Note  I connected with patient on 01/01/21 at  2:45 PM EST by telephone and verified that I am speaking with the correct person using two identifiers.   I discussed the limitations, risks, security and privacy concerns of performing an evaluation and management service by telephone and the availability of in person appointments. I also discussed with the patient that there may be a patient responsible charge related to this service. The patient expressed understanding and agreed to proceed.  Location of Patient: Home Location of Provider: Home Persons involved: Patient and provider only during the visit (nursing staff and front desk staff was involved in communicating with the patient prior to the appointment, reviewing medications and checking them in)   History of Present Illness: Chief Complaint  Patient presents with  . Anemia     HPI: Joyce Potter is a 75 y.o. female here for follow-up of iron deficiency anemia secondary to AVMs.  Patient denies any episodes of or sources of bleeding since her last visit.  Last hemoglobin had shown slight improvement in November 2021.  Patient states she is going to Dr. [facility] clinic for repeat check tomorrow.  Current Outpatient Medications  Medication Sig Dispense Refill  . albuterol (VENTOLIN HFA) 108 (90 Base) MCG/ACT inhaler Inhale 2 puffs into the lungs every 4 (four) hours as needed for wheezing or shortness of breath.     Marland Kitchen. alendronate (FOSAMAX) 70 MG tablet Take 70 mg by mouth once a week.     Marland Kitchen. amitriptyline (ELAVIL) 25 MG tablet Take 25 mg by mouth at bedtime as needed for sleep.     Marland Kitchen. aspirin EC 81 MG EC tablet Take 1 tablet (81 mg total) by mouth daily. Swallow whole. 30 tablet 11  . atorvastatin (LIPITOR) 20 MG tablet Take 20 mg  by mouth daily.    . clopidogrel (PLAVIX) 75 MG tablet Take 1 tablet (75 mg total) by mouth daily with breakfast. 30 tablet 11  . Dulaglutide 1.5 MG/0.5ML SOPN Inject 1.5 mg into the skin every Saturday.     . DULoxetine (CYMBALTA) 20 MG capsule Take 20 mg by mouth 2 (two) times daily.    Marland Kitchen. gabapentin (NEURONTIN) 300 MG capsule Take 300 mg by mouth 2 (two) times daily.     . insulin NPH-regular Human (NOVOLIN 70/30) (70-30) 100 UNIT/ML injection Inject 30 Units into the skin 2 (two) times daily with a meal.     . isosorbide mononitrate (IMDUR) 30 MG 24 hr tablet Take 30 mg by mouth daily.     Marland Kitchen. lisinopril (ZESTRIL) 5 MG tablet Take 5 mg by mouth daily.    . metoprolol tartrate (LOPRESSOR) 100 MG tablet Take 150 mg by mouth 2 (two) times daily.     Letta Pate. ONETOUCH VERIO test strip SMARTSIG:Via Meter    . pantoprazole (PROTONIX) 40 MG tablet Take 1 tablet (40 mg total) by mouth 2 (two) times daily. 60 tablet 0  . saccharomyces boulardii (FLORASTOR) 250 MG capsule Take 250 mg by mouth 2 (two) times daily.     Marland Kitchen. SPIRIVA HANDIHALER 18 MCG inhalation capsule Place 18 mcg into inhaler and inhale daily in the afternoon.     . traMADol (ULTRAM) 50 MG tablet Take 50 mg by mouth every 6 (six) hours as needed for  moderate pain.      No current facility-administered medications for this visit.   Facility-Administered Medications Ordered in Other Visits  Medication Dose Route Frequency Provider Last Rate Last Admin  . heparin lock flush 100 unit/mL  500 Units Intravenous Once Alinda Dooms, NP      . sodium chloride flush (NS) 0.9 % injection 10 mL  10 mL Intravenous Once Alinda Dooms, NP        Allergies as of 01/01/2021 - Review Complete 01/01/2021  Allergen Reaction Noted  . Sulfa antibiotics Other (See Comments) 04/13/2013  . Invokamet [canagliflozin-metformin hcl] Other (See Comments) 08/08/2015  . Lovastatin Rash 08/08/2015  . Penicillins Rash 04/13/2013  . Vicodin [hydrocodone-acetaminophen]  Rash 04/13/2013    Review of Systems:    All systems reviewed and negative except where noted in HPI.   Observations/Objective:  Labs: CMP     Component Value Date/Time   NA 141 07/24/2020 0417   NA 143 05/04/2013 0614   K 4.1 07/24/2020 0417   K 3.8 05/04/2013 0614   CL 104 07/24/2020 0417   CL 110 (H) 05/04/2013 0614   CO2 26 07/24/2020 0417   CO2 27 05/04/2013 0614   GLUCOSE 102 (H) 07/24/2020 0417   GLUCOSE 130 (H) 05/04/2013 0614   BUN 8 07/24/2020 0417   BUN 12 05/04/2013 0614   CREATININE 0.79 07/24/2020 0417   CREATININE 0.79 08/27/2014 1445   CALCIUM 8.5 (L) 07/24/2020 0417   CALCIUM 8.2 (L) 05/04/2013 0614   PROT 7.0 07/21/2020 1011   PROT 7.6 08/27/2014 1445   ALBUMIN 3.8 07/21/2020 1011   ALBUMIN 3.5 08/27/2014 1445   AST 18 07/21/2020 1011   AST 27 08/27/2014 1445   ALT 14 07/21/2020 1011   ALT 27 08/27/2014 1445   ALKPHOS 72 07/21/2020 1011   ALKPHOS 101 08/27/2014 1445   BILITOT 0.6 07/21/2020 1011   BILITOT 0.4 08/27/2014 1445   GFRNONAA >60 07/24/2020 0417   GFRNONAA >60 08/27/2014 1445   GFRAA >60 07/24/2020 0417   GFRAA >60 08/27/2014 1445   Lab Results  Component Value Date   WBC 4.9 09/25/2020   HGB 9.3 (L) 11/13/2020   HCT 27.8 (L) 09/25/2020   MCV 84.0 09/25/2020   PLT 311 09/25/2020    Imaging Studies: No results found.  Assessment and Plan:   Joyce Potter is a 75 y.o. y/o female here for follow-up of iron deficiency anemia secondary to AVMs  Assessment and Plan: I have advised patient to call us after she receives the results from Dr. Milinda Cave clinic tomorrow after her lab results  If labs are improving as expected, can continue conservative management  If they do not improve as expected, we may need to consider repeat endoscopic evaluation  Continue close follow-up in clinic  Patient has any signs of active GI bleeding we have asked her to call us or go to the ER and she voiced understanding   Follow Up  Instructions:    I discussed the assessment and treatment plan with the patient. The patient was provided an opportunity to ask questions and all were answered. The patient agreed with the plan and demonstrated an understanding of the instructions.   The patient was advised to call back or seek an in-person evaluation if the symptoms worsen or if the condition fails to improve as anticipated.  I provided 11 minutes of non-face-to-face time during this encounter. Additional time was spent in reviewing patient's chart, placing orders etc.  Virgel Manifold, MD  Speech recognition software was used to dictate this note.

## 2021-01-01 NOTE — Telephone Encounter (Signed)
Pt wants to do Virtual for her appointment today?

## 2021-01-02 ENCOUNTER — Inpatient Hospital Stay: Payer: Medicare HMO | Attending: Oncology

## 2021-01-02 ENCOUNTER — Other Ambulatory Visit: Payer: Self-pay

## 2021-01-02 DIAGNOSIS — Z79899 Other long term (current) drug therapy: Secondary | ICD-10-CM | POA: Insufficient documentation

## 2021-01-02 DIAGNOSIS — D500 Iron deficiency anemia secondary to blood loss (chronic): Secondary | ICD-10-CM | POA: Insufficient documentation

## 2021-01-02 DIAGNOSIS — K921 Melena: Secondary | ICD-10-CM | POA: Insufficient documentation

## 2021-01-02 LAB — CBC WITH DIFFERENTIAL/PLATELET
Abs Immature Granulocytes: 0.01 10*3/uL (ref 0.00–0.07)
Basophils Absolute: 0.1 10*3/uL (ref 0.0–0.1)
Basophils Relative: 1 %
Eosinophils Absolute: 0.3 10*3/uL (ref 0.0–0.5)
Eosinophils Relative: 5 %
HCT: 28.6 % — ABNORMAL LOW (ref 36.0–46.0)
Hemoglobin: 9.1 g/dL — ABNORMAL LOW (ref 12.0–15.0)
Immature Granulocytes: 0 %
Lymphocytes Relative: 27 %
Lymphs Abs: 1.3 10*3/uL (ref 0.7–4.0)
MCH: 27.2 pg (ref 26.0–34.0)
MCHC: 31.8 g/dL (ref 30.0–36.0)
MCV: 85.4 fL (ref 80.0–100.0)
Monocytes Absolute: 0.5 10*3/uL (ref 0.1–1.0)
Monocytes Relative: 10 %
Neutro Abs: 2.7 10*3/uL (ref 1.7–7.7)
Neutrophils Relative %: 57 %
Platelets: 310 10*3/uL (ref 150–400)
RBC: 3.35 MIL/uL — ABNORMAL LOW (ref 3.87–5.11)
RDW: 14.2 % (ref 11.5–15.5)
WBC: 4.8 10*3/uL (ref 4.0–10.5)
nRBC: 0 % (ref 0.0–0.2)

## 2021-01-02 LAB — IRON AND TIBC
Iron: 45 ug/dL (ref 28–170)
Saturation Ratios: 14 % (ref 10.4–31.8)
TIBC: 318 ug/dL (ref 250–450)
UIBC: 273 ug/dL

## 2021-01-02 LAB — FERRITIN: Ferritin: 16 ng/mL (ref 11–307)

## 2021-01-05 ENCOUNTER — Encounter: Payer: Self-pay | Admitting: *Deleted

## 2021-01-05 ENCOUNTER — Other Ambulatory Visit: Payer: Self-pay

## 2021-01-05 ENCOUNTER — Inpatient Hospital Stay: Payer: Medicare HMO | Admitting: Oncology

## 2021-01-05 ENCOUNTER — Inpatient Hospital Stay: Payer: Medicare HMO

## 2021-01-05 ENCOUNTER — Other Ambulatory Visit: Payer: Self-pay | Admitting: *Deleted

## 2021-01-05 NOTE — Patient Outreach (Signed)
[facility]) Care Management  01/05/2021  AZUL COFFIE [birth year] [id number]  [facility] outreachfromcomplex care patient Mrs Goeden was referred to [facility] on 05/29/20 for EMMI general discharge (on APL) after her discharge from [place] ALERTforDay #4,Wednesday 05/28/20 [id number] prescriptions. This EMMI was addressed on 06/03/20 when was reach via phone [facility], Dr KRalene Muskratalsoreceived a pharmacy referral for Lantus and Spiriva from the patient's MD officein June 2021 Mrs SWintermutehad been sent patient assistance applications to her home address for Spiriva  Insurance:[employer] [facility] x4ED visits x3in the last 6 months Last admission on7/26/21 to 07/24/20 for acute blood loss anemia/ EGD - sent to ED for bleeding, generalized weakness, dark stools for a couple weeks Hgb 6.2 06/12/20-06/15/20 for GI bleeding secondary to angiodysplastic lesion 05/20/20 to 05/23/20 [place]ossmorebleeding and anemia 05/15/20 to 05/16/20 CAD chest pain    [facility] RN CM called and left pt a message She returned the call Patient is able to verify HIPAA ([place] Reviewed and addressed the purpose of the follow up call with the patient  [facility] [facility]. Patient gave verbal consent for services.   Follow up Pt finding it hard to keep all appointments as she and husband JJenny Reichmannare having appointments sometimes on the same day  She had to cancel her oncology appointment for 01/05/21 to take Mr JJenny Reichmannto his appointment today  She is scheduled to return to oncology on 01/09/21 after more labs, infusion    She is aware that her platelets, iron and TIBC values are low Education completed on TIBC Sent EMMIs for bloody stools,hair loss , TIBC, cbc  Stools are turning darker She  suspected related to intake of foods Reviewed foods including iron that may turn stools darker She reports increased green vegetables   She has been tired is aware she will receiving iron infusion vs iron po  She is noting more Coughing voice interest in seeing pulmonology  No identified reason for cough Denies change in respiratory home care, No new exposure to irritants, no new pets, no new carpet/rug, no new mold/mildew, no change in home heating/cooking devices With assessment she reports not being near other family for the holidays but family members (daughter, son in law, son) were found positive with covid Discussed home kit usage, they have some  With covid booster shot, she experienced muscle pain and swelling but resolved  She voices concern with noting her hair is thinner and falling out  Protein shampoos/conditioners discussed   [facility] will follow up with patient with in the next 14-22 business days  Pt encouraged to return a call to [facility]RN [facility] prn Routed note to MD  Goals Addressed              This Visit's Progress     Patient Stated   .  ([facility] Anemia Prevented or Managed (pt-stated)   On track     Follow up 01/19/21  Notes: 01/05/21 low platelets, TIBC, iron, to start infusions 11/24/20 intravenous iron not needed since September 2021  Promoted use of iron-fortified foods      .  ([facility] Learn More About My Health (pt-stated)   On track     Follow Up Date 01/19/21   - ask questions - speak up when I don't understand  Notes:     Marland Kitchen  COMPLETED: [facility]) Manage My Medicine (pt-stated)   On track     Follow Up Date 01/05/21   - keep a list of all the medicines I take; vitamins and herbals too      Notes: 01/05/21 goal met        Joelene Millin L. [name], RN, BSN, [place] Coordinator Office number [phone] [facility] number [phone] Fax number [phone]

## 2021-01-09 ENCOUNTER — Encounter: Payer: Self-pay | Admitting: Oncology

## 2021-01-09 ENCOUNTER — Inpatient Hospital Stay (HOSPITAL_BASED_OUTPATIENT_CLINIC_OR_DEPARTMENT_OTHER): Payer: Medicare HMO | Admitting: Oncology

## 2021-01-09 ENCOUNTER — Inpatient Hospital Stay: Payer: Medicare HMO

## 2021-01-09 VITALS — BP 155/66 | HR 92 | Resp 20 | Wt 192.8 lb

## 2021-01-09 VITALS — BP 122/70 | HR 80 | Temp 97.1°F

## 2021-01-09 DIAGNOSIS — K921 Melena: Secondary | ICD-10-CM | POA: Diagnosis not present

## 2021-01-09 DIAGNOSIS — Q2730 Arteriovenous malformation, site unspecified: Secondary | ICD-10-CM

## 2021-01-09 DIAGNOSIS — Z79899 Other long term (current) drug therapy: Secondary | ICD-10-CM | POA: Diagnosis not present

## 2021-01-09 DIAGNOSIS — I6523 Occlusion and stenosis of bilateral carotid arteries: Secondary | ICD-10-CM | POA: Diagnosis not present

## 2021-01-09 DIAGNOSIS — D500 Iron deficiency anemia secondary to blood loss (chronic): Secondary | ICD-10-CM

## 2021-01-09 DIAGNOSIS — R079 Chest pain, unspecified: Secondary | ICD-10-CM | POA: Diagnosis not present

## 2021-01-09 DIAGNOSIS — K5521 Angiodysplasia of colon with hemorrhage: Secondary | ICD-10-CM

## 2021-01-09 DIAGNOSIS — E1129 Type 2 diabetes mellitus with other diabetic kidney complication: Secondary | ICD-10-CM | POA: Diagnosis not present

## 2021-01-09 DIAGNOSIS — E7800 Pure hypercholesterolemia, unspecified: Secondary | ICD-10-CM | POA: Diagnosis not present

## 2021-01-09 DIAGNOSIS — R809 Proteinuria, unspecified: Secondary | ICD-10-CM | POA: Diagnosis not present

## 2021-01-09 DIAGNOSIS — E1159 Type 2 diabetes mellitus with other circulatory complications: Secondary | ICD-10-CM | POA: Diagnosis not present

## 2021-01-09 DIAGNOSIS — I2510 Atherosclerotic heart disease of native coronary artery without angina pectoris: Secondary | ICD-10-CM | POA: Diagnosis not present

## 2021-01-09 DIAGNOSIS — I10 Essential (primary) hypertension: Secondary | ICD-10-CM | POA: Diagnosis not present

## 2021-01-09 DIAGNOSIS — G4733 Obstructive sleep apnea (adult) (pediatric): Secondary | ICD-10-CM | POA: Diagnosis not present

## 2021-01-09 DIAGNOSIS — I350 Nonrheumatic aortic (valve) stenosis: Secondary | ICD-10-CM | POA: Diagnosis not present

## 2021-01-09 DIAGNOSIS — I700 Atherosclerosis of aorta: Secondary | ICD-10-CM | POA: Diagnosis not present

## 2021-01-09 LAB — HEMOGLOBIN: Hemoglobin: 7.4 g/dL — ABNORMAL LOW (ref 12.0–15.0)

## 2021-01-09 LAB — VITAMIN B12: Vitamin B-12: 626 pg/mL (ref 180–914)

## 2021-01-09 MED ORDER — SODIUM CHLORIDE 0.9 % IV SOLN: 200.0000 mg | Freq: Once | INTRAVENOUS | Status: DC

## 2021-01-09 MED ORDER — SODIUM CHLORIDE 0.9 % IV SOLN
INTRAVENOUS | Status: DC
  Filled 2021-01-09: qty 250

## 2021-01-09 MED ORDER — IRON SUCROSE 20 MG/ML IV SOLN
200.0000 mg | Freq: Once | INTRAVENOUS | Status: AC
  Administered 2021-01-09: 200 mg via INTRAVENOUS
  Filled 2021-01-09: qty 10

## 2021-01-09 NOTE — Progress Notes (Signed)
[place]  [phone] [phone]  ID: Joyce Potter OB: Nov 05, 1946  MR#: [id number]  KZS#:[id number]  Patient Care Team: [name], MD as PCP - General (Internal Medicine) Lloyd Huger, MD as Consulting Physician (Hematology and Oncology) Barbaraann Faster, RN as [place] Management Fath, Javier Docker, MD as Consulting Physician (Cardiology) Solum, Betsey Holiday, MD as Physician Assistant (Endocrinology) Virgel Manifold, MD as Consulting Physician (Gastroenterology) Jacquelin Hawking, NP as Nurse Practitioner (Oncology)  CHIEF COMPLAINT: Iron deficiency anemia, secondary to chronic blood loss.  INTERVAL HISTORY: Patient returns to clinic today for repeat laboratory work, further evaluation, and continuation of treatment with IV Venofer.  She last received IV Venofer on 10/10/2020.   Today, she reports feeling well.  She states she had to quarantine for most of Christmas secondary to an exposure in her family.  She has noticed some increased weakness and fatigue.  She denies any active bleeding. She has no neurologic complaints.  She has a good appetite and denies weight loss.  She denies any recent fevers or illnesses.  She denies chest pain, shortness of breath, cough or hemoptysis. She denies any nausea, vomiting, constipation, or diarrhea.  She has no urinary complaints.  Patient offers no further specific complaints today.  REVIEW OF SYSTEMS:   Review of Systems  Constitutional: Positive for malaise/fatigue. Negative for diaphoresis, fever and weight loss.  Eyes: Negative.  Negative for blurred vision.  Respiratory: Negative.  Negative for cough, hemoptysis, shortness of breath and wheezing.   Cardiovascular: Negative.  Negative for chest pain and leg swelling.  Gastrointestinal: Negative.  Negative for abdominal pain, blood in stool and melena.  Genitourinary: Negative.  Negative for flank pain and hematuria.   Musculoskeletal: Negative.  Negative for falls and joint pain.  Skin: Negative.  Negative for rash.  Neurological: Positive for weakness. Negative for dizziness, focal weakness and headaches.  Psychiatric/Behavioral: Negative.  The patient is not nervous/anxious.    As per HPI. Otherwise, a complete review of systems is negative.  PAST MEDICAL HISTORY: Past Medical History:  Diagnosis Date  . Anemia    iron deficiency  . Aortic stenosis   . Basal cell carcinoma   . CAD (coronary artery disease)    s/p Left circumflex stent in 2012  . Carotid stenosis   . Cataract   . High cholesterol   . HTN (hypertension)   . Hyperlipidemia   . IDDM (insulin dependent diabetes mellitus)   . Multiple thyroid nodules    Benign  . Peptic ulcer disease   . Retinal artery occlusion    on left    PAST SURGICAL HISTORY: Past Surgical History:  Procedure Laterality Date  . ABDOMINAL HYSTERECTOMY    . ARTERY BIOPSY Left 11/19/2015   Procedure: BIOPSY TEMPORAL ARTERY;  Surgeon: Algernon Huxley, MD;  Location: [facility] ORS;  Service: Vascular;  Laterality: Left;  . BREAST BIOPSY Left 2002   core- neg  . BREAST EXCISIONAL BIOPSY    . CARDIAC CATHETERIZATION N/A 08/08/2015   Procedure: Right and Left Heart Cath and Coronary Angiography;  Surgeon: Teodoro Spray, MD;  Location: [place] CV LAB;  Service: Cardiovascular;  Laterality: N/A;  . CARDIAC CATHETERIZATION Bilateral 09/03/2016   Procedure: Right/Left Heart Cath and Coronary Angiography;  Surgeon: Teodoro Spray, MD;  Location: [place] [facility] LAB;  Service: Cardiovascular;  Laterality: Bilateral;  . CATARACT EXTRACTION W/ INTRAOCULAR LENS  IMPLANT, BILATERAL    . COLONOSCOPY  in 2013- internal hemorrhoids  . COLONOSCOPY WITH PROPOFOL N/A 07/07/2018   Procedure: COLONOSCOPY WITH PROPOFOL;  Surgeon: Manya Silvas, MD;  Location[facility] END[facility]COPY;  Service: Endoscopy;  Laterality: N/A;  . COLONOSCOPY WITH PROPOFOL N/A 05/22/2020   Procedure:  COLONOSCOPY WITH PROPOFOL;  Surgeon: Jonathon Bellows, MD;  Location: [facility] ENDOSCOPY;  Service: Gastroenterology;  Laterality: N/A;  . CORONARY ANGIOGRAPHY N/A 09/14/2017   Procedure: CORONARY ANGIOGRAPHY;  Surgeon: Teodoro Spray, MD;  Location: [place] CV LAB;  Service: Cardiovascular;  Laterality: N/A;  . CORONARY ANGIOPLASTY    . CORONARY STENT INTERVENTION N/A 05/15/2020   Procedure: coronary angiography;  Surgeon: Teodoro Spray, MD;  Location: [place] CV LAB;  Service: Cardiovascular;  Laterality: N/A;  . CORONARY STENT INTERVENTION N/A 05/15/2020   Procedure: CORONARY STENT INTERVENTION;  Surgeon: Isaias Cowman, MD;  Location: [place] CV LAB;  Service: Cardiovascular;  Laterality: N/A;  . CORONARY STENT PLACEMENT    . ENDARTERECTOMY Left 10/01/2015   Procedure: ENDARTERECTOMY CAROTID;  Surgeon: Algernon Huxley, MD;  Location: [facility] ORS;  Service: Vascular;  Laterality: Left;  . ENTEROSCOPY N/A 07/24/2020   Procedure: ENTEROSCOPY;  Surgeon: Virgel Manifold, MD;  Location: [facility] ENDOSCOPY;  Service: Endoscopy;  Laterality: N/A;  . ESOPHAGOGASTRODUODENOSCOPY    . ESOPHAGOGASTRODUODENOSCOPY (EGD) WITH PROPOFOL N/A 05/31/2016   Procedure: ESOPHAGOGASTRODUODENOSCOPY (EGD) WITH PROPOFOL;  Surgeon: Manya Silvas, MD;  Location: [facility] ENDOSCOPY;  Service: Endoscopy;  Laterality: N/A;  . ESOPHAGOGASTRODUODENOSCOPY (EGD) WITH PROPOFOL N/A 07/07/2018   Procedure: ESOPHAGOGASTRODUODENOSCOPY (EGD) WITH PROPOFOL;  Surgeon: Manya Silvas, MD;  Location: [facility] ENDOSCOPY;  Service: Endoscopy;  Laterality: N/A;  . ESOPHAGOGASTRODUODENOSCOPY (EGD) WITH PROPOFOL N/A 07/17/2018   Procedure: ESOPHAGOGASTRODUODENOSCOPY (EGD) WITH PROPOFOL;  Surgeon: Lucilla Lame, MD;  Location: [facility] ENDOSCOPY;  Service: Endoscopy;  Laterality: N/A;  . ESOPHAGOGASTRODUODENOSCOPY (EGD) WITH PROPOFOL N/A 05/21/2020   Procedure: ESOPHAGOGASTRODUODENOSCOPY (EGD) WITH PROPOFOL;  Surgeon: Jonathon Bellows, MD;  Location:  [facility] ENDOSCOPY;  Service: Gastroenterology;  Laterality: N/A;  . ESOPHAGOGASTRODUODENOSCOPY (EGD) WITH PROPOFOL N/A 06/13/2020   Procedure: ESOPHAGOGASTRODUODENOSCOPY (EGD) WITH PROPOFOL;  Surgeon: Lucilla Lame, MD;  Location: [facility] ENDOSCOPY;  Service: Endoscopy;  Laterality: N/A;  . EYE SURGERY    . GIVENS CAPSULE STUDY N/A 04/17/2013   Procedure: GIVENS CAPSULE STUDY;  Surgeon: Arta Silence, MD;  Location: [facility] ENDOSCOPY;  Service: Endoscopy;  Laterality: N/A;  patient ate breakfast at 7am   . GIVENS CAPSULE STUDY N/A 06/11/2020   Procedure: GIVENS CAPSULE STUDY;  Surgeon: Virgel Manifold, MD;  Location: [facility] ENDOSCOPY;  Service: Endoscopy;  Laterality: N/A;  . HEMORRHOID BANDING  07/27/2016   Dr Nicholes Stairs  . PARTIAL HYSTERECTOMY    . RIGHT AND LEFT HEART CATH Bilateral 09/14/2017   Procedure: RIGHT AND LEFT HEART CATH;  Surgeon: Teodoro Spray, MD;  Location: [place] CV LAB;  Service: Cardiovascular;  Laterality: Bilateral;  . RIGHT/LEFT HEART CATH AND CORONARY ANGIOGRAPHY Right 05/15/2020   Procedure: Right/Left Heart cath;  Surgeon: Teodoro Spray, MD;  Location: [place] CV LAB;  Service: Cardiovascular;  Laterality: Right;  . TRIGGER FINGER RELEASE      FAMILY HISTORY Family History  Problem Relation Age of Onset  . Uterine cancer Mother   . Breast cancer Mother 40  . Seizures Father   . Stroke Father   . Diabetes Father   . COPD Father   . Colon cancer Neg Hx       ADVANCED DIRECTIVES:    HEALTH MAINTENANCE:  Social History   Tobacco Use  . Smoking status: Former Smoker    Quit date: 02/15/1985    Years since quitting: 35.9  . Smokeless tobacco: Never Used  . Tobacco comment: quit about 31 years ago  Vaping Use  . Vaping Use: Never used  Substance Use Topics  . Alcohol use: No    Alcohol/week: 0.0 standard drinks  . Drug use: No    Colonoscopy:  PAP:  Bone density: Osteoporosis- DEXA 06/2017 (on fosamax)  Lipid panel:  Allergies  Allergen  Reactions  . Sulfa Antibiotics Other (See Comments)    Altered Mental Status  . Invokamet [Canagliflozin-Metformin Hcl] Other (See Comments)    Yeast Infection  . Lovastatin Rash  . Penicillins Rash    Has patient had a PCN reaction causing immediate rash, facial/tongue/throat swelling, SOB or lightheadedness with hypotension:Yes Has patient had a PCN reaction causing severe rash involving mucus membranes or skin necrosis:all over body Has patient had a PCN reaction that required hospitalization: No Has patient had a PCN reaction occurring within the last 10 years: No If all of the above answers are "NO", then may proceed with Cephalosporin use.   . Vicodin [Hydrocodone-Acetaminophen] Rash    Current Outpatient Medications  Medication Sig Dispense Refill  . albuterol (VENTOLIN HFA) 108 (90 Base) MCG/ACT inhaler Inhale 2 puffs into the lungs every 4 (four) hours as needed for wheezing or shortness of breath.     Marland Kitchen alendronate (FOSAMAX) 70 MG tablet Take 70 mg by mouth once a week.     Marland Kitchen amitriptyline (ELAVIL) 25 MG tablet Take 25 mg by mouth at bedtime as needed for sleep.     Marland Kitchen aspirin EC 81 MG EC tablet Take 1 tablet (81 mg total) by mouth daily. Swallow whole. 30 tablet 11  . atorvastatin (LIPITOR) 20 MG tablet Take 20 mg by mouth daily.    . clopidogrel (PLAVIX) 75 MG tablet Take 1 tablet (75 mg total) by mouth daily with breakfast. 30 tablet 11  . Dulaglutide 1.5 MG/0.5ML SOPN Inject 1.5 mg into the skin every Saturday.     . DULoxetine (CYMBALTA) 20 MG capsule Take 20 mg by mouth 2 (two) times daily.    Marland Kitchen gabapentin (NEURONTIN) 300 MG capsule Take 300 mg by mouth 2 (two) times daily.     . insulin NPH-regular Human (NOVOLIN 70/30) (70-30) 100 UNIT/ML injection Inject 30 Units into the skin 2 (two) times daily with a meal.     . isosorbide mononitrate (IMDUR) 30 MG 24 hr tablet Take 30 mg by mouth daily.     . metoprolol tartrate (LOPRESSOR) 100 MG tablet Take 150 mg by mouth 2 (two)  times daily.     Glory Rosebush VERIO test strip SMARTSIG:Via Meter    . pantoprazole (PROTONIX) 40 MG tablet Take 1 tablet (40 mg total) by mouth 2 (two) times daily. 60 tablet 0  . saccharomyces boulardii (FLORASTOR) 250 MG capsule Take 250 mg by mouth 2 (two) times daily.     Marland Kitchen SPIRIVA HANDIHALER 18 MCG inhalation capsule Place 18 mcg into inhaler and inhale daily in the afternoon.     . traMADol (ULTRAM) 50 MG tablet Take 50 mg by mouth every 6 (six) hours as needed for moderate pain.      No current facility-administered medications for this visit.   Facility-Administered Medications Ordered in Other Visits  Medication Dose Route Frequency Provider Last Rate Last Admin  . heparin lock flush 100 unit/mL  500  Units Intravenous Once Verlon Au, NP      . sodium chloride flush (NS) 0.9 % injection 10 mL  10 mL Intravenous Once Verlon Au, NP        OBJECTIVE: Vitals:   01/09/21 1136  BP: (!) 155/66  Pulse: 92  Resp: 20     Body mass index is 34.15 kg/m.    ECOG FS:1 - Symptomatic but completely ambulatory  Physical Exam Constitutional:      General: Vital signs are normal.     Appearance: Normal appearance.  HENT:     Head: Normocephalic and atraumatic.  Eyes:     Pupils: Pupils are equal, round, and reactive to light.  Cardiovascular:     Rate and Rhythm: Normal rate and regular rhythm.     Heart sounds: Normal heart sounds. No murmur heard.   Pulmonary:     Effort: Pulmonary effort is normal.     Breath sounds: Normal breath sounds. No wheezing.  Abdominal:     General: Bowel sounds are normal. There is no distension.     Palpations: Abdomen is soft.     Tenderness: There is no abdominal tenderness.  Musculoskeletal:        General: No edema. Normal range of motion.     Cervical back: Normal range of motion.  Skin:    General: Skin is warm and dry.     Findings: No rash.  Neurological:     Mental Status: She is alert and oriented to person, place, and time.   Psychiatric:        Judgment: Judgment normal.      LAB RESULTS:  Lab Results  Component Value Date   NA 141 07/24/2020   K 4.1 07/24/2020   CL 104 07/24/2020   CO2 26 07/24/2020   GLUCOSE 102 (H) 07/24/2020   BUN 8 07/24/2020   CREATININE 0.79 07/24/2020   CALCIUM 8.5 (L) 07/24/2020   PROT 7.0 07/21/2020   ALBUMIN 3.8 07/21/2020   AST 18 07/21/2020   ALT 14 07/21/2020   ALKPHOS 72 07/21/2020   BILITOT 0.6 07/21/2020   GFRNONAA >60 07/24/2020   GFRAA >60 07/24/2020    Lab Results  Component Value Date   WBC 4.8 01/02/2021   NEUTROABS 2.7 01/02/2021   HGB 9.1 (L) 01/02/2021   HCT 28.6 (L) 01/02/2021   MCV 85.4 01/02/2021   PLT 310 01/02/2021   Lab Results  Component Value Date   IRON 45 01/02/2021   TIBC 318 01/02/2021   IRONPCTSAT 14 01/02/2021    Lab Results  Component Value Date   FERRITIN 16 01/02/2021     STUDIES: No results found.  ASSESSMENT: Iron deficiency anemia, secondary to chronic blood loss.  PLAN:    1. Iron deficiency anemia:  - Secondary to GI bleed -Has had upper endoscopy and colonoscopy recently. -There was 1 mm angiodysplastic lesion without bleeding found in the jejunum.  Everything else appeared normal. -Labs from 01/02/2021 show hemoglobin of 9.7, iron saturations 14% and ferritin 16. - Patient wishes to have her vitamin B12 level and her hemoglobin drawn again.  She states she feels as though it may have dropped in the past week.   -Admits to 2 dark stools over the past week -Repeat hemoglobin reveals a hemoglobin of 7.4.  -Discussed with Mrs. Bohlen that if she has any additional black stools she will need to present to the emergency room. -We will add her on for lab work and possible  blood on Tuesday.  -Have also asked her to get in touch with GI for follow-up ASAP.  Disposition: -She will get IV Venofer today and again in 1 week. -RTC on Tuesday, 01/13/2021 for labs and possible blood. - Patient to get in touch with GI  to be seen for melena.  Greater than 50% was spent in counseling and coordination of care with this patient including but not limited to discussion of the relevant topics above (See A&P) including, but not limited to diagnosis and management of acute and chronic medical conditions.   Patient expressed understanding and was in agreement with this plan. She also understands that She can call clinic at any time with any questions, concerns, or complaints.   Jacquelin Hawking, NP 01/09/21 11:56 AM

## 2021-01-09 NOTE — Progress Notes (Signed)
Pt tolerated infusion well. No s/s of distress or reaction noted. Pt and VS stable at discharge.  

## 2021-01-09 NOTE — Progress Notes (Signed)
Patient reports fatigue, weakness and pain in shoulders.

## 2021-01-12 ENCOUNTER — Telehealth: Payer: Self-pay

## 2021-01-12 ENCOUNTER — Other Ambulatory Visit: Payer: Self-pay | Admitting: *Deleted

## 2021-01-12 DIAGNOSIS — D500 Iron deficiency anemia secondary to blood loss (chronic): Secondary | ICD-10-CM

## 2021-01-12 NOTE — Telephone Encounter (Signed)
Called patient back to let her know that Dr. Bonna Gains would like for her to have a a capsule study as soon as possible if she was having active bleeding. However, patient stated that she had black stool but that yesterday and today she noticed that her stools were back to normal color. Therefore, she stated that she will wait till tomorrow to decide if she should do the capsule study or not. She has an appointment with the hematologist tomorrow and she will want for [place] to check and then let her know if she needs the capsule study or not.

## 2021-01-13 ENCOUNTER — Inpatient Hospital Stay: Payer: Medicare HMO

## 2021-01-13 ENCOUNTER — Inpatient Hospital Stay: Payer: Medicare HMO | Admitting: Oncology

## 2021-01-13 DIAGNOSIS — K921 Melena: Secondary | ICD-10-CM | POA: Diagnosis not present

## 2021-01-13 DIAGNOSIS — G4733 Obstructive sleep apnea (adult) (pediatric): Secondary | ICD-10-CM | POA: Diagnosis not present

## 2021-01-13 DIAGNOSIS — Z79899 Other long term (current) drug therapy: Secondary | ICD-10-CM | POA: Diagnosis not present

## 2021-01-13 DIAGNOSIS — D500 Iron deficiency anemia secondary to blood loss (chronic): Secondary | ICD-10-CM | POA: Diagnosis not present

## 2021-01-13 LAB — CBC WITH DIFFERENTIAL/PLATELET
Abs Immature Granulocytes: 0.02 10*3/uL (ref 0.00–0.07)
Basophils Absolute: 0.1 10*3/uL (ref 0.0–0.1)
Basophils Relative: 1 %
Eosinophils Absolute: 0.1 10*3/uL (ref 0.0–0.5)
Eosinophils Relative: 2 %
HCT: 26.7 % — ABNORMAL LOW (ref 36.0–46.0)
Hemoglobin: 8.2 g/dL — ABNORMAL LOW (ref 12.0–15.0)
Immature Granulocytes: 0 %
Lymphocytes Relative: 11 %
Lymphs Abs: 0.6 10*3/uL — ABNORMAL LOW (ref 0.7–4.0)
MCH: 27.3 pg (ref 26.0–34.0)
MCHC: 30.7 g/dL (ref 30.0–36.0)
MCV: 89 fL (ref 80.0–100.0)
Monocytes Absolute: 0.5 10*3/uL (ref 0.1–1.0)
Monocytes Relative: 10 %
Neutro Abs: 4.3 10*3/uL (ref 1.7–7.7)
Neutrophils Relative %: 76 %
Platelets: 321 10*3/uL (ref 150–400)
RBC: 3 MIL/uL — ABNORMAL LOW (ref 3.87–5.11)
RDW: 16.5 % — ABNORMAL HIGH (ref 11.5–15.5)
WBC: 5.6 10*3/uL (ref 4.0–10.5)
nRBC: 0 % (ref 0.0–0.2)

## 2021-01-13 LAB — IRON AND TIBC
Iron: 23 ug/dL — ABNORMAL LOW (ref 28–170)
Saturation Ratios: 7 % — ABNORMAL LOW (ref 10.4–31.8)
TIBC: 351 ug/dL (ref 250–450)
UIBC: 328 ug/dL

## 2021-01-13 LAB — SAMPLE TO BLOOD BANK

## 2021-01-13 LAB — FERRITIN: Ferritin: 67 ng/mL (ref 11–307)

## 2021-01-13 NOTE — Progress Notes (Signed)
Would you mind calling her and letting her know her hemoglobin improved? I don't think she needs any blood.   Faythe Casa, NP 01/13/2021 3:13 PM

## 2021-01-14 ENCOUNTER — Other Ambulatory Visit: Payer: Self-pay

## 2021-01-14 ENCOUNTER — Telehealth: Payer: Self-pay | Admitting: *Deleted

## 2021-01-14 DIAGNOSIS — D509 Iron deficiency anemia, unspecified: Secondary | ICD-10-CM

## 2021-01-14 LAB — METHYLMALONIC ACID, SERUM: Methylmalonic Acid, Quantitative: 246 nmol/L (ref 0–378)

## 2021-01-14 NOTE — Telephone Encounter (Signed)
Called patient to let her know that she is needing to have a capsule study done on 01/27/2021 at the [facility]. Patient was also instructed that she is to call the endoscopy unit the day before to ask at what time she is to arrive at the [facility].

## 2021-01-14 NOTE — Telephone Encounter (Signed)
Patient had her labs done yesterday, can you please let me know what you would want to do.

## 2021-01-14 NOTE — Telephone Encounter (Signed)
I am recommending capsule study due to the anemia even if stool is normal

## 2021-01-14 NOTE — Telephone Encounter (Signed)
Call placed to patient to review lab results from 1/18, hgb 8.2-per Rulon Abide patient does not require blood transfusion at this time. Appointment for transfusion cancelled for 1/20, patient to keep previously scheduled appt for IV iron on 1/21. Patient verbalized understanding of plan and is agreeable.

## 2021-01-15 ENCOUNTER — Inpatient Hospital Stay: Payer: Medicare HMO

## 2021-01-16 ENCOUNTER — Inpatient Hospital Stay: Payer: Medicare HMO

## 2021-01-19 ENCOUNTER — Other Ambulatory Visit: Payer: Self-pay | Admitting: *Deleted

## 2021-01-19 ENCOUNTER — Other Ambulatory Visit: Payer: Self-pay

## 2021-01-19 ENCOUNTER — Encounter: Payer: Self-pay | Admitting: *Deleted

## 2021-01-19 DIAGNOSIS — M810 Age-related osteoporosis without current pathological fracture: Secondary | ICD-10-CM | POA: Diagnosis not present

## 2021-01-19 DIAGNOSIS — E1129 Type 2 diabetes mellitus with other diabetic kidney complication: Secondary | ICD-10-CM | POA: Diagnosis not present

## 2021-01-19 DIAGNOSIS — E1169 Type 2 diabetes mellitus with other specified complication: Secondary | ICD-10-CM | POA: Diagnosis not present

## 2021-01-19 DIAGNOSIS — Z794 Long term (current) use of insulin: Secondary | ICD-10-CM | POA: Diagnosis not present

## 2021-01-19 DIAGNOSIS — E785 Hyperlipidemia, unspecified: Secondary | ICD-10-CM | POA: Diagnosis not present

## 2021-01-19 DIAGNOSIS — E1159 Type 2 diabetes mellitus with other circulatory complications: Secondary | ICD-10-CM | POA: Diagnosis not present

## 2021-01-19 DIAGNOSIS — E1142 Type 2 diabetes mellitus with diabetic polyneuropathy: Secondary | ICD-10-CM | POA: Diagnosis not present

## 2021-01-19 DIAGNOSIS — E669 Obesity, unspecified: Secondary | ICD-10-CM | POA: Diagnosis not present

## 2021-01-19 DIAGNOSIS — R809 Proteinuria, unspecified: Secondary | ICD-10-CM | POA: Diagnosis not present

## 2021-01-19 NOTE — Patient Outreach (Signed)
[place] [facility]) Care Management  01/19/2021  Joyce Potter June 08, 1946 [id number]   [facility] outreachfromcomplex care patient Joyce Potter was referred to [facility] on 05/29/20 for EMMI general discharge (on APL) after her discharge from [place] ALERTforDay #4,Wednesday 05/28/20 1064forUnfilled prescriptions. This EMMI was addressed on 06/03/20 when was reach via phone [name] consulted with [facility] pharmacy staff, Joyce Potter alsoreceived a pharmacy referral for Lantus and Spiriva from the patient's MD officein June 2021 Joyce Potter had been sent patient assistance applications to her home address for Spiriva  Insurance:[employer] [facility] x4ED visits x3in the last 6 months Last admission on7/26/21 to 07/24/20 for acute blood loss anemia/ EGD - sent to ED for bleeding, generalized weakness, dark stools for a couple weeks Hgb 6.2 06/12/20-06/15/20 for GI bleeding secondary to angiodysplastic lesion 05/20/20 to 05/23/20 [place] bleeding and anemia 05/15/20 to 05/16/20 CAD chest pain  Follow up Still  Have family members still sick with covid remains isolated from them all She has not been to church services either Only out for errands and MD appointments  Medications Pt received paperwork from [facility] and sent back in mail today  Did not send retirement form but a copy of the monetary amount   Hypertension (HTN) DBP go to 43-71 off lisinopril only on 1 BP 110/70 today  Diabetes Now has freestyle learned about use of new freestyle from endocrinologist today with  use of her android phone for monitoring Saw endocrinology, Joyce Gabriel Carina 01/19/21 HgA1c 5.6 CBG this am 55  She now has a sliding scale  Likes her Trulicity lost 10 lbs on Trulicity and wanting to lose 20+ more pounds Husband, Joyce Potter supportive Commended pt on her success with HgA1c and use of Free style glucometer  Patient Active Problem List   Diagnosis Date  Noted  . AVM (arteriovenous malformation) of small bowel, acquired with hemorrhage 01/09/2021  . Aortic atherosclerosis ([place]) 08/12/2020  . Anemia 07/22/2020  . Acute blood loss anemia 07/21/2020  . History of blood transfusion 06/27/2020  . OSA (obstructive sleep apnea) 06/27/2020  . GI bleeding 06/12/2020  . Diabetes mellitus without complication ([place]) 01/60/1093  . Anemia due to blood loss 05/20/2020  . Blood loss anemia   . Chronic fatigue 03/21/2020  . Diabetes mellitus type 2 in obese ([place]) 03/21/2020  . Obesity, Class II, BMI 35-39.9 03/21/2020  . Snoring 03/21/2020  . Background diabetic retinopathy associated with type 2 diabetes mellitus ([place]) 06/06/2019  . Uncontrolled type 2 diabetes mellitus with hyperglycemia ([place]) 04/30/2019  . Osteoporosis, post-menopausal 04/30/2019  . Multiple rib fractures 10/11/2018  . GIB (gastrointestinal bleeding) 07/17/2018  . Melena   . Symptomatic anemia   . PVD (peripheral vascular disease) ([place]) 02/11/2017  . Iron deficiency anemia due to chronic blood loss 07/13/2016  . Multinodular goiter 01/25/2016  . Central retinal artery occlusion 10/01/2015  . Carotid stenosis 10/01/2015  . Stroke ([place]) 09/28/2015  . Aortic stenosis, moderate 11/29/2014  . PUD (peptic ulcer disease) 07/17/2014  . HLD (hyperlipidemia) 05/07/2013  . GI bleed from an occult source with recurrent chronic blood loss anemia 04/14/2013  . CAD (coronary artery disease) 04/14/2013  . IDDM (insulin dependent diabetes mellitus) 04/14/2013  . HTN (hypertension) 04/14/2013  . Chest pain 04/14/2013    [facility] RN CM will follow up with patient with in the next 14-22 business days  Pt encouraged to return a call to [facility] RN CM prn Routed note to MD  Goals Addressed              This Visit's Progress     Patient Stated   .  [facility]) Anemia Prevented or Managed (pt-stated)   On track     Follow up 02/19/21  Notes: 01/19/21 Hgb from 9.1 to 7.4 but still scheduled for  endoscopy on 01/29/21 hx of 2 lower polyps cauterized with good results, hx of hysterectomy/no bleeding 01/05/21 low platelets, TIBC, iron, to start infusions 11/24/20 intravenous iron not needed since September 2021  Promoted use of iron-fortified foods      .  [facility]) Learn More About My Health (pt-stated)   On track     Follow Up Date 02/19/21   - ask questions - speak up when I don't understand      Notes:  01/19/21 learned about use of new freestyle from endocrinologist today with  use of her android phone for monitoring    .  [facility]) Track and Manage My Blood Pressure-Hypertension (pt-stated)   On track     Timeframe:  Short-Term Goal Priority:  Medium Start Date:                   01/19/21          Expected End Date:          05/19/21             Follow Up Date 02/19/21    - check blood pressure weekly - choose a place to take my blood pressure (home, clinic or office, retail store)       Notes:        Joyce L. [name], RN, BSN, [place] Coordinator Office number [phone] [facility] number [phone] Fax number [phone]

## 2021-01-21 ENCOUNTER — Inpatient Hospital Stay: Payer: Medicare HMO

## 2021-01-21 VITALS — BP 143/61 | HR 74 | Temp 96.3°F

## 2021-01-21 DIAGNOSIS — D500 Iron deficiency anemia secondary to blood loss (chronic): Secondary | ICD-10-CM | POA: Diagnosis not present

## 2021-01-21 DIAGNOSIS — Z79899 Other long term (current) drug therapy: Secondary | ICD-10-CM | POA: Diagnosis not present

## 2021-01-21 DIAGNOSIS — K921 Melena: Secondary | ICD-10-CM | POA: Diagnosis not present

## 2021-01-21 MED ORDER — SODIUM CHLORIDE 0.9 % IV SOLN: 200.0000 mg | Freq: Once | INTRAVENOUS | Status: DC

## 2021-01-21 MED ORDER — IRON SUCROSE 20 MG/ML IV SOLN
200.0000 mg | Freq: Once | INTRAVENOUS | Status: AC
  Administered 2021-01-21: 200 mg via INTRAVENOUS
  Filled 2021-01-21: qty 10

## 2021-01-21 MED ORDER — SODIUM CHLORIDE 0.9 % IV SOLN
Freq: Once | INTRAVENOUS | Status: AC
  Filled 2021-01-21: qty 250

## 2021-01-21 NOTE — Progress Notes (Signed)
Pt tolerated infusion well. No s/s of distress or reaction noted. Pt and VS stable at discharge.  

## 2021-01-23 DIAGNOSIS — I350 Nonrheumatic aortic (valve) stenosis: Secondary | ICD-10-CM | POA: Diagnosis not present

## 2021-01-25 IMAGING — CT CT ANGIOGRAPHY CHEST
4 of 11 series · 18 of 46 positions shown · IV contrast (APPLIED)
Comparison: CT scan of the chest dated 09/27/2011 and CT scan of
the abdomen dated 07/17/2018

CLINICAL DATA: Pleuritic chest pain and abdominal pain.

EXAM:
CT ANGIOGRAPHY CHEST WITH CONTRAST
TECHNIQUE: Multidetector CT imaging of the chest was performed using the
standard protocol during bolus administration of intravenous
contrast. Multiplanar CT image reconstructions and MIPs were
obtained to evaluate the vascular anatomy.
CONTRAST:  75mL OMNIPAQUE IOHEXOL 350 MG/ML SOLN

[Series 5: axial st · axial · 0.65mm/px · z∈[-240,-99]mm · 4 of 79 slices shown (1 of 2)]
[im 16/79  lung]
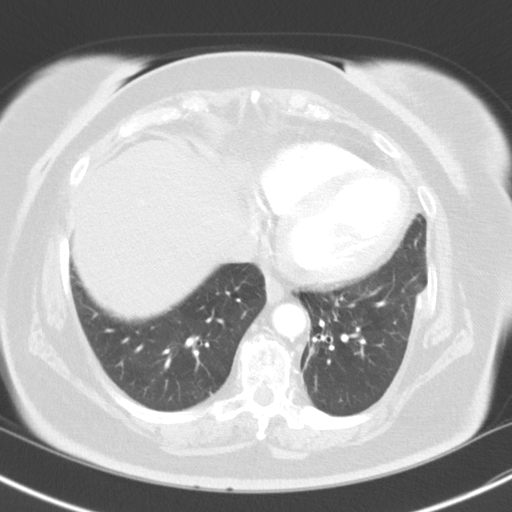
[im 32/79  lung]
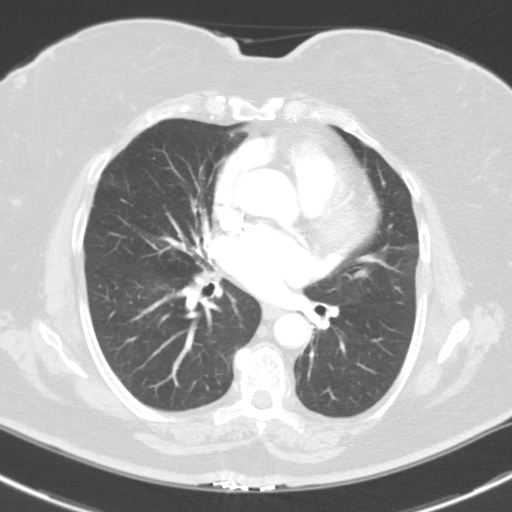
[im 47/79  lung]
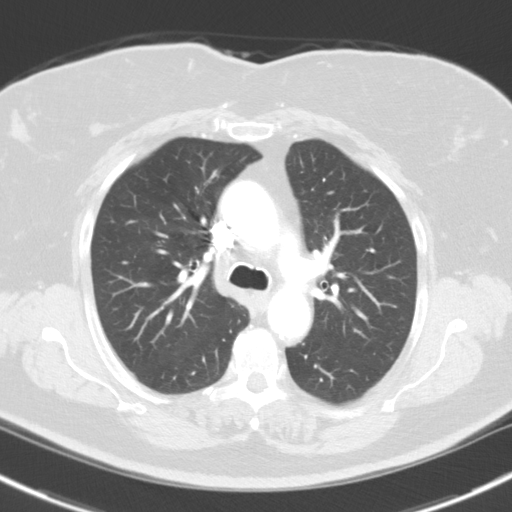
[im 63/79  lung]
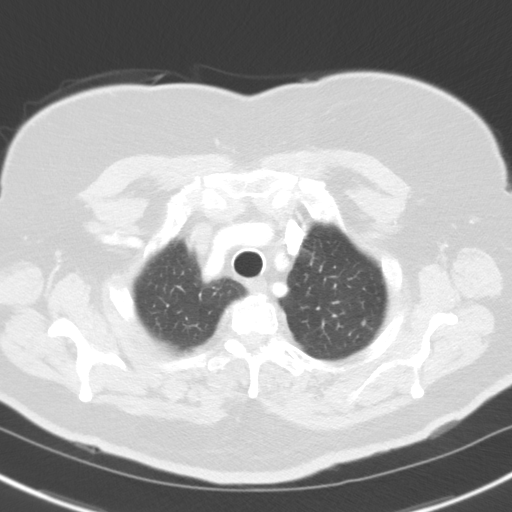

[Series 6: thins · axial · 0.65mm/px · z∈[-271,-67]mm · 8 of 236 slices shown]
[im 16/236  lung]
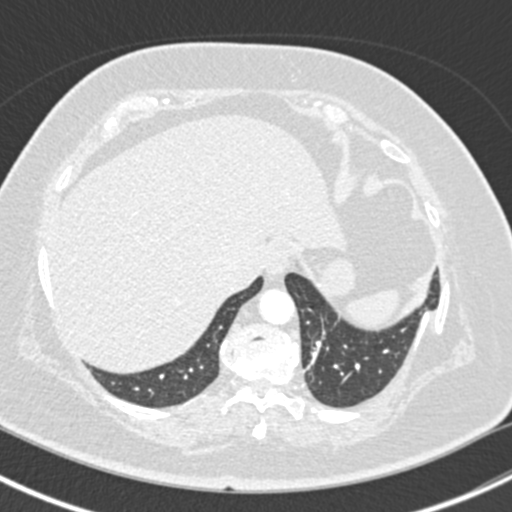
[im 48/236  lung]
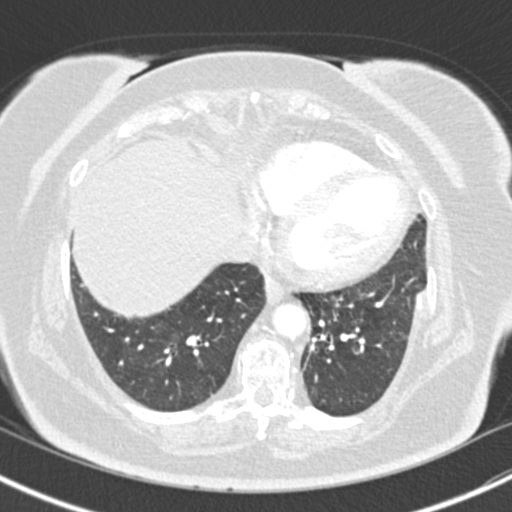
[im 79/236  lung]
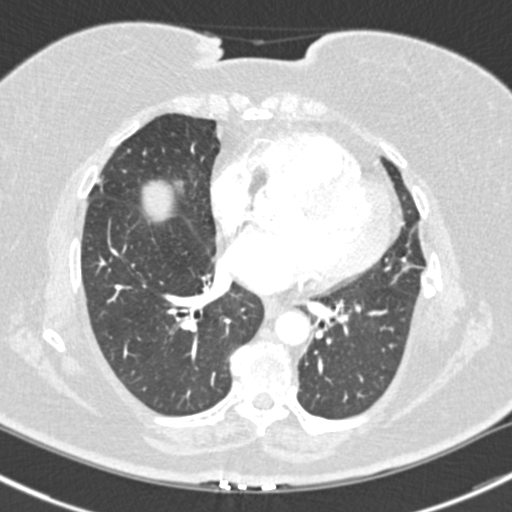
[im 110/236  lung]
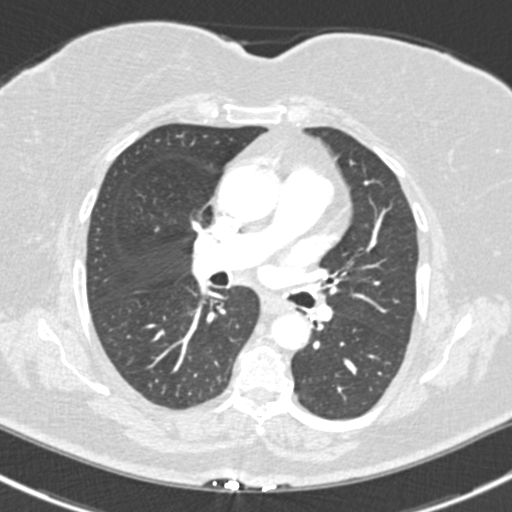
[im 126/236  lung]
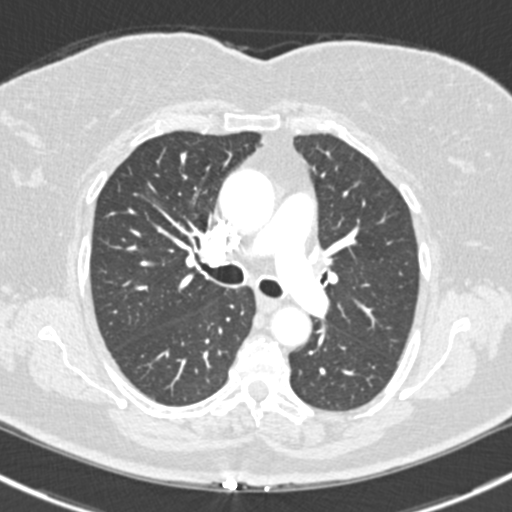
[im 157/236  lung]
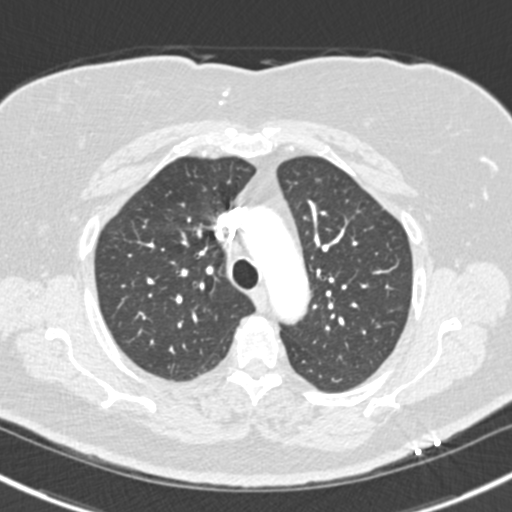
[im 189/236  lung]
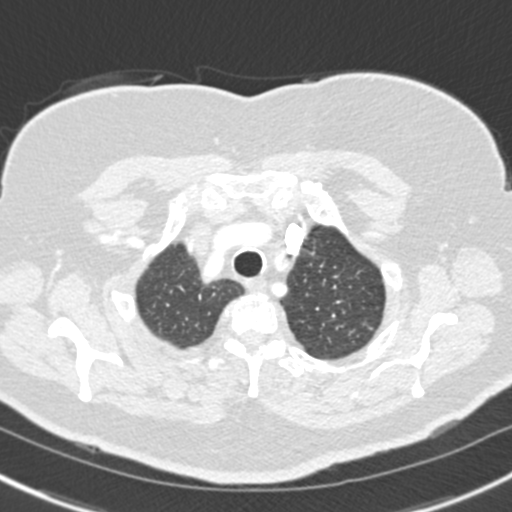
[im 220/236  lung]
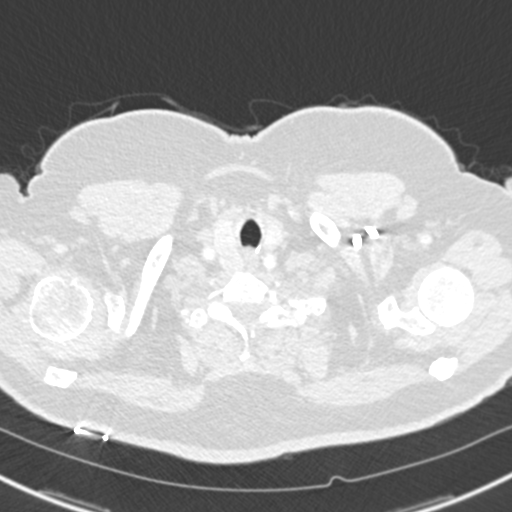

[Series 8: coronal mpr · coronal · 0.51mm/px · 2 of 155 slices shown]
[im 52/155  soft-tissue]
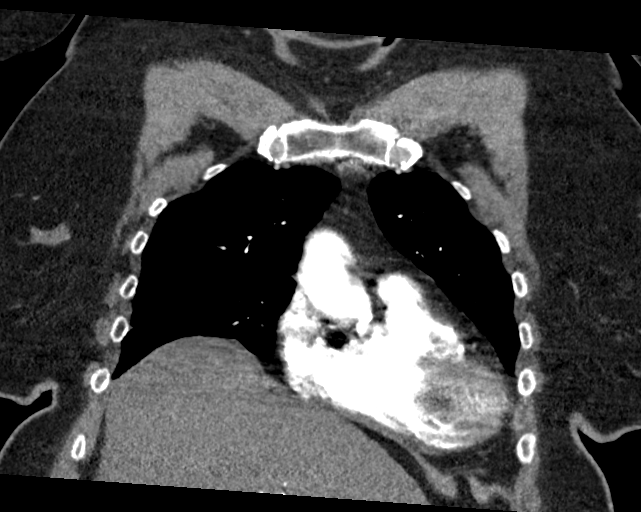
[im 103/155  soft-tissue]
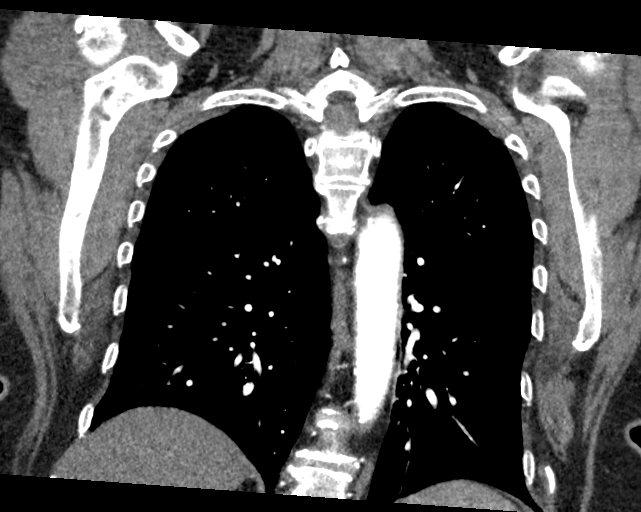

[Series 9: axial st · axial · 0.88mm/px · z∈[-552,-267]mm · 4 of 97 slices shown (2 of 2)]
[im 20/97  lung]
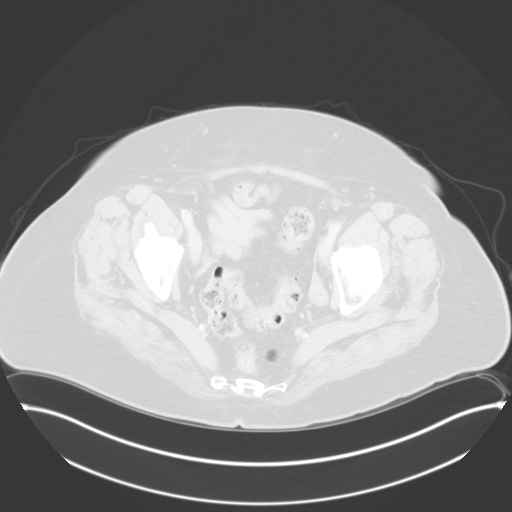
[im 39/97  soft-tissue]
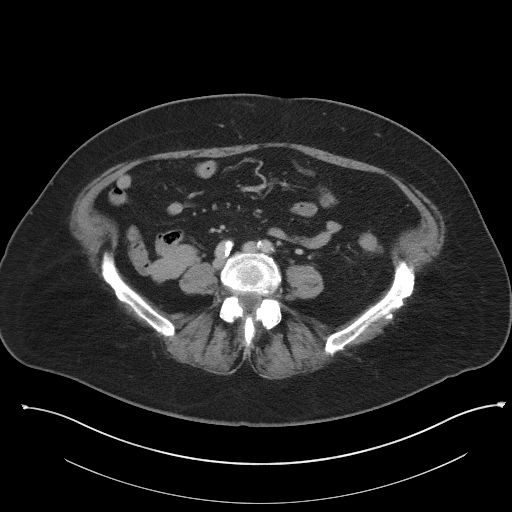
[im 58/97  lung]
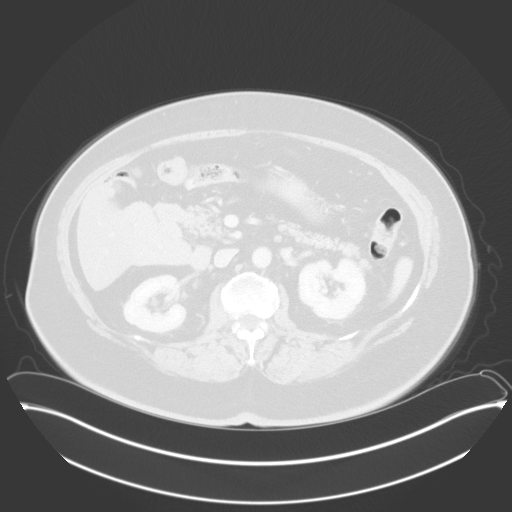
[im 77/97  soft-tissue]
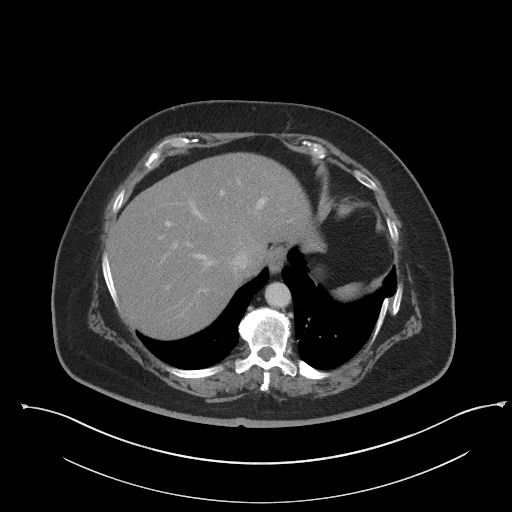

[18 of 46 positions shown; findings below may reference images not displayed]

FINDINGS: Cardiovascular: Satisfactory opacification of the pulmonary arteries
to the segmental level. No evidence of pulmonary embolism. Normal
heart size. No pericardial effusion. Aortic atherosclerosis.
Coronary artery calcifications.

Mediastinum/Nodes: No enlarged mediastinal, hilar, or axillary lymph
nodes. Thyroid gland, trachea, and esophagus demonstrate no
significant findings.

Lungs/Pleura: There is a 17 x 8 mm irregular nodule at the left lung
base laterally on image 70 of series 7. There are adjacent old
nonunion rib fractures. There is a 2 mm nodule in the left lower
lobe on image 51 of series 7, stable. There is a 2 mm stable nodule
on image 27 of series 7 in the left upper lobe.

Upper Abdomen: New hepatomegaly. Hepatic steatosis.

Musculoskeletal: No chest wall abnormality. No acute or significant
osseous findings.

Review of the MIP images confirms the above findings.
IMPRESSION: 1. No pulmonary emboli or other acute abnormalities.
2. Irregular 17 x 8 mm nodule at the left lung base laterally. This
could represent a primary lung carcinoma or metastatic disease.
However, I suspect it may be posttraumatic in origin since the
patient does have multiple adjacent but not acute rib fractures
which are new since 6307. Comparison with any outside prior studies
performed at the time of chest trauma would be helpful.
3. New hepatomegaly with hepatic steatosis.

Aortic Atherosclerosis ([id number]).

## 2021-01-27 ENCOUNTER — Other Ambulatory Visit
Admission: RE | Admit: 2021-01-27 | Discharge: 2021-01-27 | Disposition: A | Discharge status: Home / Self Care | Payer: Medicare HMO | Source: Ambulatory Visit | Attending: Gastroenterology | Admitting: Gastroenterology

## 2021-01-27 ENCOUNTER — Other Ambulatory Visit: Payer: Self-pay

## 2021-01-27 DIAGNOSIS — Z794 Long term (current) use of insulin: Secondary | ICD-10-CM | POA: Diagnosis not present

## 2021-01-27 DIAGNOSIS — Z008 Encounter for other general examination: Secondary | ICD-10-CM | POA: Diagnosis not present

## 2021-01-27 DIAGNOSIS — E785 Hyperlipidemia, unspecified: Secondary | ICD-10-CM | POA: Diagnosis not present

## 2021-01-27 DIAGNOSIS — I2510 Atherosclerotic heart disease of native coronary artery without angina pectoris: Secondary | ICD-10-CM | POA: Diagnosis not present

## 2021-01-27 DIAGNOSIS — I10 Essential (primary) hypertension: Secondary | ICD-10-CM | POA: Diagnosis not present

## 2021-01-27 DIAGNOSIS — Z01812 Encounter for preprocedural laboratory examination: Secondary | ICD-10-CM | POA: Diagnosis not present

## 2021-01-27 DIAGNOSIS — J449 Chronic obstructive pulmonary disease, unspecified: Secondary | ICD-10-CM | POA: Diagnosis not present

## 2021-01-27 DIAGNOSIS — Z20822 Contact with and (suspected) exposure to covid-19: Secondary | ICD-10-CM | POA: Insufficient documentation

## 2021-01-27 DIAGNOSIS — R69 Illness, unspecified: Secondary | ICD-10-CM | POA: Diagnosis not present

## 2021-01-27 DIAGNOSIS — E669 Obesity, unspecified: Secondary | ICD-10-CM | POA: Diagnosis not present

## 2021-01-27 DIAGNOSIS — E1151 Type 2 diabetes mellitus with diabetic peripheral angiopathy without gangrene: Secondary | ICD-10-CM | POA: Diagnosis not present

## 2021-01-27 DIAGNOSIS — E1142 Type 2 diabetes mellitus with diabetic polyneuropathy: Secondary | ICD-10-CM | POA: Diagnosis not present

## 2021-01-27 LAB — SARS CORONAVIRUS 2 (TAT 6-24 HRS): SARS Coronavirus 2: NEGATIVE

## 2021-01-29 ENCOUNTER — Other Ambulatory Visit: Payer: Self-pay

## 2021-01-29 ENCOUNTER — Encounter
Admission: RE | Disposition: A | Discharge status: Home / Self Care | Payer: Self-pay | Source: Home / Self Care | Attending: Gastroenterology

## 2021-01-29 ENCOUNTER — Ambulatory Visit
Admission: RE | Admit: 2021-01-29 | Discharge: 2021-01-29 | Disposition: A | Discharge status: Home / Self Care | Payer: Medicare HMO | Attending: Gastroenterology | Admitting: Gastroenterology

## 2021-01-29 DIAGNOSIS — I890 Lymphedema, not elsewhere classified: Secondary | ICD-10-CM | POA: Diagnosis not present

## 2021-01-29 DIAGNOSIS — D509 Iron deficiency anemia, unspecified: Secondary | ICD-10-CM | POA: Insufficient documentation

## 2021-01-29 HISTORY — PX: GIVENS CAPSULE STUDY: SHX5432

## 2021-01-29 SURGERY — IMAGING PROCEDURE, GI TRACT, INTRALUMINAL, VIA CAPSULE

## 2021-01-30 ENCOUNTER — Encounter: Payer: Self-pay | Admitting: Gastroenterology

## 2021-02-04 DIAGNOSIS — E1142 Type 2 diabetes mellitus with diabetic polyneuropathy: Secondary | ICD-10-CM | POA: Diagnosis not present

## 2021-02-04 DIAGNOSIS — I700 Atherosclerosis of aorta: Secondary | ICD-10-CM | POA: Diagnosis not present

## 2021-02-04 DIAGNOSIS — I2510 Atherosclerotic heart disease of native coronary artery without angina pectoris: Secondary | ICD-10-CM | POA: Diagnosis not present

## 2021-02-04 DIAGNOSIS — E7800 Pure hypercholesterolemia, unspecified: Secondary | ICD-10-CM | POA: Diagnosis not present

## 2021-02-04 DIAGNOSIS — I10 Essential (primary) hypertension: Secondary | ICD-10-CM | POA: Diagnosis not present

## 2021-02-04 DIAGNOSIS — I6523 Occlusion and stenosis of bilateral carotid arteries: Secondary | ICD-10-CM | POA: Diagnosis not present

## 2021-02-04 DIAGNOSIS — G4733 Obstructive sleep apnea (adult) (pediatric): Secondary | ICD-10-CM | POA: Diagnosis not present

## 2021-02-04 DIAGNOSIS — I350 Nonrheumatic aortic (valve) stenosis: Secondary | ICD-10-CM | POA: Diagnosis not present

## 2021-02-04 DIAGNOSIS — Z23 Encounter for immunization: Secondary | ICD-10-CM | POA: Diagnosis not present

## 2021-02-09 DIAGNOSIS — I10 Essential (primary) hypertension: Secondary | ICD-10-CM | POA: Diagnosis not present

## 2021-02-09 DIAGNOSIS — G4733 Obstructive sleep apnea (adult) (pediatric): Secondary | ICD-10-CM | POA: Diagnosis not present

## 2021-02-09 DIAGNOSIS — I2510 Atherosclerotic heart disease of native coronary artery without angina pectoris: Secondary | ICD-10-CM | POA: Diagnosis not present

## 2021-02-09 DIAGNOSIS — E118 Type 2 diabetes mellitus with unspecified complications: Secondary | ICD-10-CM | POA: Diagnosis not present

## 2021-02-13 DIAGNOSIS — G4733 Obstructive sleep apnea (adult) (pediatric): Secondary | ICD-10-CM | POA: Diagnosis not present

## 2021-02-18 ENCOUNTER — Other Ambulatory Visit (INDEPENDENT_AMBULATORY_CARE_PROVIDER_SITE_OTHER): Payer: Self-pay | Admitting: Vascular Surgery

## 2021-02-18 ENCOUNTER — Ambulatory Visit: Payer: Medicare HMO | Admitting: Gastroenterology

## 2021-02-18 DIAGNOSIS — I6523 Occlusion and stenosis of bilateral carotid arteries: Secondary | ICD-10-CM

## 2021-02-19 ENCOUNTER — Other Ambulatory Visit: Payer: Self-pay

## 2021-02-19 ENCOUNTER — Other Ambulatory Visit: Payer: Self-pay | Admitting: *Deleted

## 2021-02-19 NOTE — Patient Outreach (Signed)
[facility]) Care Management  02/19/2021  Joyce Potter September 29, 1946 [id number]   [facility] outreachfromcomplex care patient Joyce Potter was referred to [facility] on 05/29/20 for EMMI general discharge (on APL) after her discharge from [place] ALERTforDay #4,Wednesday 05/28/20 1069forUnfilled prescriptions. This EMMI was addressed on 06/03/20 when was reach via phone [facility] RN CM consulted with [facility] pharmacy staff, Dr Ralene Muskrat alsoreceived a pharmacy referral for Lantus and Spiriva from the patient's MD officein June 2021 Joyce Potter had been sent patient assistance applications to her home address for Spiriva  Insurance:[employer] [facility] x4ED visits x3in the last 6 months Last admission on7/26/21 to 07/24/20 for acute blood loss anemia/ EGD - sent to ED for bleeding, generalized weakness, dark stools for a couple weeks Hgb 6.2 06/12/20-06/15/20 for GI bleeding secondary to angiodysplastic lesion 05/20/20 to 05/23/20 [place] bleeding and anemia 05/15/20 to 05/16/20 CAD chest pain  Follow up assessment Patient is able to verify HIPAA ([place]) identifiers Reviewed and addressed the purpose of the follow up call with the patient  Consent: [facility] RN CM reviewed [facility] services with patient. Patient gave verbal consent for services.  Reviewed last cardiology visit and discussed results of imaging and labs Believes in power of prayer- husband with medical challenges    diabetes  still appreciating the libre  Medications  Voices no cost or delivery concerns at this time   Plan [facility] RN CM will follow up with patient within the next 30 business days Pt encouraged to return a call to [place] CM prn   [place] Jenifer L. [name], RN, BSN, [place] Coordinator Office number [phone] Main [facility] number [phone] Fax number  [phone]

## 2021-02-20 ENCOUNTER — Ambulatory Visit (INDEPENDENT_AMBULATORY_CARE_PROVIDER_SITE_OTHER): Payer: Medicare HMO

## 2021-02-20 ENCOUNTER — Encounter (INDEPENDENT_AMBULATORY_CARE_PROVIDER_SITE_OTHER): Payer: Self-pay | Admitting: Vascular Surgery

## 2021-02-20 ENCOUNTER — Ambulatory Visit (INDEPENDENT_AMBULATORY_CARE_PROVIDER_SITE_OTHER): Payer: Medicare HMO | Admitting: Vascular Surgery

## 2021-02-20 VITALS — BP 144/70 | HR 73 | Resp 16 | Wt 193.4 lb

## 2021-02-20 DIAGNOSIS — I10 Essential (primary) hypertension: Secondary | ICD-10-CM

## 2021-02-20 DIAGNOSIS — I739 Peripheral vascular disease, unspecified: Secondary | ICD-10-CM

## 2021-02-20 DIAGNOSIS — M79604 Pain in right leg: Secondary | ICD-10-CM | POA: Diagnosis not present

## 2021-02-20 DIAGNOSIS — E1165 Type 2 diabetes mellitus with hyperglycemia: Secondary | ICD-10-CM

## 2021-02-20 DIAGNOSIS — M79605 Pain in left leg: Secondary | ICD-10-CM

## 2021-02-20 DIAGNOSIS — M79609 Pain in unspecified limb: Secondary | ICD-10-CM | POA: Insufficient documentation

## 2021-02-20 DIAGNOSIS — I6523 Occlusion and stenosis of bilateral carotid arteries: Secondary | ICD-10-CM

## 2021-02-20 NOTE — Assessment & Plan Note (Signed)
blood glucose control important in reducing the progression of atherosclerotic disease. Also, involved in wound healing. On appropriate medications.  

## 2021-02-20 NOTE — Assessment & Plan Note (Signed)
blood pressure control important in reducing the progression of atherosclerotic disease. On appropriate oral medications.  

## 2021-02-20 NOTE — Assessment & Plan Note (Signed)
To evaluate her perfusion, ABIs were checked today showing normal triphasic waveforms with a right ABI of 1.2 and a left ABI 0.95 with normal digital pressures and waveforms bilaterally.  Perfusion does not appear to be contributing to her lower extremity pain.

## 2021-02-20 NOTE — Progress Notes (Signed)
MRN : [id number]  Joyce Potter is a 75 y.o. (25-Apr-1946) female who presents with chief complaint of  Chief Complaint  Patient presents with  . Follow-up    Ultrasound follow up  .  History of Present Illness: Patient returns today in follow up.  Her biggest complaint currently is of painful and cold feet and lower legs.  She is already on gabapentin for neuropathy and takes Elavil as well.  No open wounds or infections.  To evaluate her perfusion, ABIs were checked today showing normal triphasic waveforms with a right ABI of 1.2 and a left ABI 0.95 with normal digital pressures and waveforms bilaterally. She is also followed for carotid disease.  She is 5 to 6 years status post left carotid endarterectomy.  No focal symptoms of cerebrovascular ischemia. Specifically, the patient denies amaurosis fugax, speech or swallowing difficulties, or arm or leg weakness or numbness. Duplex today shows her left carotid endarterectomy to be widely patent without recurrent stenosis.  Right carotid stenosis is in the 1 to 39% range.  Current Outpatient Medications  Medication Sig Dispense Refill  . albuterol (VENTOLIN HFA) 108 (90 Base) MCG/ACT inhaler Inhale 2 puffs into the lungs every 4 (four) hours as needed for wheezing or shortness of breath.     Marland Kitchen alendronate (FOSAMAX) 70 MG tablet Take 70 mg by mouth once a week.     Marland Kitchen amitriptyline (ELAVIL) 25 MG tablet Take 25 mg by mouth at bedtime as needed for sleep.     Marland Kitchen aspirin EC 81 MG EC tablet Take 1 tablet (81 mg total) by mouth daily. Swallow whole. 30 tablet 11  . atorvastatin (LIPITOR) 20 MG tablet Take 20 mg by mouth daily.    . clopidogrel (PLAVIX) 75 MG tablet Take 1 tablet (75 mg total) by mouth daily with breakfast. 30 tablet 11  . Dulaglutide 1.5 MG/0.5ML SOPN Inject 1.5 mg into the skin every Saturday.     . DULoxetine (CYMBALTA) 20 MG capsule Take 20 mg by mouth 2 (two) times daily.    Marland Kitchen gabapentin (NEURONTIN) 300 MG capsule Take 300  mg by mouth 2 (two) times daily.     Marland Kitchen glucose monitoring kit (FREESTYLE) monitoring kit 1 each by Does not apply route as needed for other.    . insulin NPH-regular Human (NOVOLIN 70/30) (70-30) 100 UNIT/ML injection Inject 30 Units into the skin 2 (two) times daily with a meal.     . metoprolol tartrate (LOPRESSOR) 100 MG tablet Take 150 mg by mouth 2 (two) times daily.     Glory Rosebush VERIO test strip SMARTSIG:Via Meter    . pantoprazole (PROTONIX) 40 MG tablet Take 1 tablet (40 mg total) by mouth 2 (two) times daily. 60 tablet 0  . saccharomyces boulardii (FLORASTOR) 250 MG capsule Take 250 mg by mouth 2 (two) times daily.     Marland Kitchen SPIRIVA HANDIHALER 18 MCG inhalation capsule Place 18 mcg into inhaler and inhale daily in the afternoon.     . traMADol (ULTRAM) 50 MG tablet Take 50 mg by mouth every 6 (six) hours as needed for moderate pain.     . isosorbide mononitrate (IMDUR) 30 MG 24 hr tablet Take 30 mg by mouth daily.  (Patient not taking: Reported on 01/19/2021)     No current facility-administered medications for this visit.   Facility-Administered Medications Ordered in Other Visits  Medication Dose Route Frequency Provider Last Rate Last Admin  . heparin lock flush 100 unit/mL  500 Units Intravenous Once Verlon Au, NP      . sodium chloride flush (NS) 0.9 % injection 10 mL  10 mL Intravenous Once Verlon Au, NP        Past Medical History:  Diagnosis Date  . Anemia    iron deficiency  . Aortic stenosis   . Basal cell carcinoma   . CAD (coronary artery disease)    s/p Left circumflex stent in 2012  . Carotid stenosis   . Cataract   . High cholesterol   . HTN (hypertension)   . Hyperlipidemia   . IDDM (insulin dependent diabetes mellitus)   . Multiple thyroid nodules    Benign  . Peptic ulcer disease   . Retinal artery occlusion    on left    Past Surgical History:  Procedure Laterality Date  . ABDOMINAL HYSTERECTOMY    . ARTERY BIOPSY Left 11/19/2015    Procedure: BIOPSY TEMPORAL ARTERY;  Surgeon: Algernon Huxley, MD;  Location: [facility] ORS;  Service: Vascular;  Laterality: Left;  . BREAST BIOPSY Left 2002   core- neg  . BREAST EXCISIONAL BIOPSY    . CARDIAC CATHETERIZATION N/A 08/08/2015   Procedure: Right and Left Heart Cath and Coronary Angiography;  Surgeon: Teodoro Spray, MD;  Location: [place] [facility];  Service: Cardiovascular;  Laterality: N/A;  . CARDIAC CATHETERIZATION Bilateral 09/03/2016   Procedure: Right/Left Heart Cath and Coronary Angiography;  Surgeon: Teodoro Spray, MD;  Location: [place] [facility];  Service: Cardiovascular;  Laterality: Bilateral;  . CATARACT EXTRACTION W/ INTRAOCULAR LENS  IMPLANT, BILATERAL    . COLONOSCOPY     in 2013- internal hemorrhoids  . COLONOSCOPY WITH PROPOFOL N/A 07/07/2018   Procedure: COLONOSCOPY WITH PROPOFOL;  Surgeon: Manya Silvas, MD;  [facility];  Service: Endoscopy;  Laterality: N/A;  . COLONOSCOPY WITH PROPOFOL N/A 05/22/2020   Procedure: COLONOSCOPY WITH PROPOFOL;  Surgeon: Jonathon Bellows, MD;  Location: [facility] ENDOSCOPY;  Service: Gastroenterology;  Laterality: N/A;  . CORONARY ANGIOGRAPHY N/A 09/14/2017   Procedure: CORONARY ANGIOGRAPHY;  Surgeon: Teodoro Spray, MD;  Location: [place] [facility];  Service: Cardiovascular;  Laterality: N/A;  . CORONARY ANGIOPLASTY    . CORONARY STENT INTERVENTION N/A 05/15/2020   Procedure: coronary angiography;  Surgeon: Teodoro Spray, MD;  Location: [place] [facility];  Service: Cardiovascular;  Laterality: N/A;  . CORONARY STENT INTERVENTION N/A 05/15/2020   Procedure: CORONARY STENT INTERVENTION;  Surgeon: Isaias Cowman, MD;  Location: [place] [facility];  Service: Cardiovascular;  Laterality: N/A;  . CORONARY STENT PLACEMENT    . ENDARTERECTOMY Left 10/01/2015   Procedure: ENDARTERECTOMY CAROTID;  Surgeon: Algernon Huxley, MD;  Location: [facility] ORS;  Service: Vascular;  Laterality: Left;  . ENTEROSCOPY N/A 07/24/2020    Procedure: ENTEROSCOPY;  Surgeon: Virgel Manifold, MD;  Location: [facility]COPY;  Service: Endoscopy;  Laterality: N/A;  . ESOPHAGOGASTRODUODENOSCOPY    . ESOPHAGOGASTRODUODENOSCOPY (EGD) WITH PROPOFOL N/A 05/31/2016   Procedure: ESOPHAGOGASTRODUODENOSCOPY (EGD) WITH PROPOFOL;  Surgeon: Manya Silvas, MD;  Location: [facility] ENDOSCOPY;  Service: Endoscopy;  Laterality: N/A;  . ESOPHAGOGASTRODUODENOSCOPY (EGD) WITH PROPOFOL N/A 07/07/2018   Procedure: ESOPHAGOGASTRODUODENOSCOPY (EGD) WITH PROPOFOL;  Surgeon: Manya Silvas, MD;  Location: [facility] ENDOSCOPY;  Service: Endoscopy;  Laterality: N/A;  . ESOPHAGOGASTRODUODENOSCOPY (EGD) WITH PROPOFOL N/A 07/17/2018   Procedure: ESOPHAGOGASTRODUODENOSCOPY (EGD) WITH PROPOFOL;  Surgeon: Lucilla Lame, MD;  Location: [facility] ENDOSCOPY;  Service: Endoscopy;  Laterality: N/A;  . ESOPHAGOGASTRODUODENOSCOPY (EGD)  WITH PROPOFOL N/A 05/21/2020   Procedure: ESOPHAGOGASTRODUODENOSCOPY (EGD) WITH PROPOFOL;  Surgeon: Jonathon Bellows, MD;  Location: [facility] ENDOSCOPY;  Service: Gastroenterology;  Laterality: N/A;  . ESOPHAGOGASTRODUODENOSCOPY (EGD) WITH PROPOFOL N/A 06/13/2020   Procedure: ESOPHAGOGASTRODUODENOSCOPY (EGD) WITH PROPOFOL;  Surgeon: Lucilla Lame, MD;  Location: [facility] ENDOSCOPY;  Service: Endoscopy;  Laterality: N/A;  . EYE SURGERY    . GIVENS CAPSULE STUDY N/A 04/17/2013   Procedure: GIVENS CAPSULE STUDY;  Surgeon: Arta Silence, MD;  Location: [facility] ENDOSCOPY;  Service: Endoscopy;  Laterality: N/A;  patient ate breakfast at 7am   . GIVENS CAPSULE STUDY N/A 06/11/2020   Procedure: GIVENS CAPSULE STUDY;  Surgeon: Virgel Manifold, MD;  Location: [facility] ENDOSCOPY;  Service: Endoscopy;  Laterality: N/A;  . GIVENS CAPSULE STUDY N/A 01/29/2021   Procedure: GIVENS CAPSULE STUDY;  Surgeon: Virgel Manifold, MD;  Location: [facility] ENDOSCOPY;  Service: Endoscopy;  Laterality: N/A;  . HEMORRHOID BANDING  07/27/2016   Dr Nicholes Stairs  . PARTIAL HYSTERECTOMY    . RIGHT AND LEFT  HEART CATH Bilateral 09/14/2017   Procedure: RIGHT AND LEFT HEART CATH;  Surgeon: Teodoro Spray, MD;  Location: [place] [facility];  Service: Cardiovascular;  Laterality: Bilateral;  . RIGHT/LEFT HEART CATH AND CORONARY ANGIOGRAPHY Right 05/15/2020   Procedure: Right/Left Heart cath;  Surgeon: Teodoro Spray, MD;  Location: [place] [facility];  Service: Cardiovascular;  Laterality: Right;  . TRIGGER FINGER RELEASE       Social History   Tobacco Use  . Smoking status: Former Smoker    Quit date: 02/15/1985    Years since quitting: 36.0  . Smokeless tobacco: Never Used  . Tobacco comment: quit about 31 years ago  Vaping Use  . Vaping Use: Never used  Substance Use Topics  . Alcohol use: No    Alcohol/week: 0.0 standard drinks  . Drug use: No     Family History  Problem Relation Age of Onset  . Uterine cancer Mother   . Breast cancer Mother 17  . Seizures Father   . Stroke Father   . Diabetes Father   . COPD Father   . Colon cancer Neg Hx     Allergies  Allergen Reactions  . Sulfa Antibiotics Other (See Comments)    Altered Mental Status  . Invokamet [Canagliflozin-Metformin Hcl] Other (See Comments)    Yeast Infection  . Lovastatin Rash  . Penicillins Rash    Has patient had a PCN reaction causing immediate rash, facial/tongue/throat swelling, SOB or lightheadedness with hypotension:Yes Has patient had a PCN reaction causing severe rash involving mucus membranes or skin necrosis:all over body Has patient had a PCN reaction that required hospitalization: No Has patient had a PCN reaction occurring within the last 10 years: No If all of the above answers are "NO", then may proceed with Cephalosporin use.   . Vicodin [Hydrocodone-Acetaminophen] Rash     REVIEW OF SYSTEMS (Negative unless checked)  Constitutional: [] Weight loss  [] Fever  [] Chills Cardiac: [] Chest pain   [] Chest pressure   [] Palpitations   [] Shortness of breath when laying flat   [] Shortness of  breath at rest   [] Shortness of breath with exertion. Vascular:  [] Pain in legs with walking   [] Pain in legs at rest   [] Pain in legs when laying flat   [] Claudication   [] Pain in feet when walking  [] Pain in feet at rest  [] Pain in feet when laying flat   [] History of DVT   [] Phlebitis   []   Swelling in legs   [] Varicose veins   [] Non-healing ulcers Pulmonary:   [] Uses home oxygen   [] Productive cough   [] Hemoptysis   [] Wheeze  [] COPD   [] Asthma Neurologic:  [] Dizziness  [] Blackouts   [] Seizures   [] History of stroke   [] History of TIA  [] Aphasia   [] Temporary blindness   [] Dysphagia   [] Weakness or numbness in arms   [x] Weakness or numbness in legs Musculoskeletal:  [x] Arthritis   [] Joint swelling   [x] Joint pain   [] Low back pain Hematologic:  [] Easy bruising  [] Easy bleeding   [] Hypercoagulable state   [] Anemic   Gastrointestinal:  [] Blood in stool   [] Vomiting blood  [] Gastroesophageal reflux/heartburn   [] Abdominal pain Genitourinary:  [] Chronic kidney disease   [] Difficult urination  [] Frequent urination  [] Burning with urination   [] Hematuria Skin:  [] Rashes   [] Ulcers   [] Wounds Psychological:  [] History of anxiety   []  History of major depression.  Physical Examination  BP (!) 144/70 (BP Location: Left Arm)   Pulse 73   Resp 16   Wt 193 lb 6.4 oz (87.7 kg)   BMI 34.26 kg/m  Gen:  WD/WN, NAD Head: [place]/AT, No temporalis wasting. Ear/Nose/Throat: Hearing grossly intact, nares w/o erythema or drainage Eyes: Conjunctiva clear. Sclera non-icteric Neck: Supple.  Trachea midline Pulmonary:  Good air movement, no use of accessory muscles.  Cardiac: RRR, no JVD Vascular: Vessel Right Left  Radial Palpable Palpable                          PT Palpable Palpable  DP Palpable Palpable   Gastrointestinal: soft, non-tender/non-distended. No guarding/reflex.  Musculoskeletal: M/S 5/5 throughout.  No deformity or atrophy. Trace LE edema. Neurologic: Sensation grossly intact in  extremities.  Symmetrical.  Speech is fluent.  Psychiatric: Judgment intact, Mood & affect appropriate for pt's clinical situation. Dermatologic: No rashes or ulcers noted.  No cellulitis or open wounds.       Labs Recent Results (from the past 2160 hour(s))  Iron and TIBC     Status: None   Collection Time: 01/02/21 11:35 AM  Result Value Ref Range   Iron 45 28 - 170 ug/dL   TIBC 318 250 - 450 ug/dL   Saturation Ratios 14 10.4 - 31.8 %   UIBC 273 ug/dL    Comment: Performed at [facility], [address] [place], [place] [address]  Ferritin     Status: None   Collection Time: 01/02/21 11:35 AM  Result Value Ref Range   Ferritin 16 11 - 307 ng/mL    Comment: Performed at [facility], [address] [place], [place] [address]  CBC with Differential/Platelet     Status: Abnormal   Collection Time: 01/02/21 11:49 AM  Result Value Ref Range   WBC 4.8 4.0 - 10.5 K/uL   RBC 3.35 (L) 3.87 - 5.11 MIL/uL   Hemoglobin 9.1 (L) 12.0 - 15.0 g/dL   HCT 28.6 (L) 36.0 - 46.0 %   MCV 85.4 80.0 - 100.0 fL   MCH 27.2 26.0 - 34.0 pg   MCHC 31.8 30.0 - 36.0 g/dL   RDW 14.2 11.5 - 15.5 %   Platelets 310 150 - 400 K/uL   nRBC 0.0 0.0 - 0.2 %   Neutrophils Relative % 57 %   Neutro Abs 2.7 1.7 - 7.7 K/uL   Lymphocytes Relative 27 %   Lymphs Abs 1.3 0.7 - 4.0 K/uL   Monocytes  Relative 10 %   Monocytes Absolute 0.5 0.1 - 1.0 K/uL   Eosinophils Relative 5 %   Eosinophils Absolute 0.3 0.0 - 0.5 K/uL   Basophils Relative 1 %   Basophils Absolute 0.1 0.0 - 0.1 K/uL   Immature Granulocytes 0 %   Abs Immature Granulocytes 0.01 0.00 - 0.07 K/uL    Comment: Performed at [facility], [address] [place], [place] [address]  Hemoglobin [facility])     Status: Abnormal   Collection Time: 01/09/21 12:45 PM  Result Value Ref Range   Hemoglobin 7.4 (L) 12.0 - 15.0 g/dL    Comment: Performed at [facility], [address], [place], [place] [address]  Methylmalonic  acid, serum     Status: None   Collection Time: 01/09/21 12:45 PM  Result Value Ref Range   Methylmalonic Acid, Quantitative 246 0 - 378 nmol/L    Comment: (NOTE) This test was developed and its performance characteristics determined by [facility]. It has not been cleared or approved by the Food and Drug Administration. Performed At: [facility] [place], [place] [id number] Rush Farmer MD GE:[id number]   Vitamin B12     Status: None   Collection Time: 01/09/21 12:45 PM  Result Value Ref Range   Vitamin B-12 626 180 - 914 pg/mL    Comment: (NOTE) This assay is not validated for testing neonatal or myeloproliferative syndrome specimens for Vitamin B12 levels. Performed at [facility], [place] [address], [place], [place] [address]   Hold Tube- Blood Bank     Status: None   Collection Time: 01/13/21  2:32 PM  Result Value Ref Range   Blood Bank Specimen SAMPLE AVAILABLE FOR TESTING    Sample Expiration      01/16/2021,2359 Performed at [facility], [address] [place],  [address]   CBC with Differential/Platelet     Status: Abnormal   Collection Time: 01/13/21  2:41 PM  Result Value Ref Range   WBC 5.6 4.0 - 10.5 K/uL   RBC 3.00 (L) 3.87 - 5.11 MIL/uL   Hemoglobin 8.2 (L) 12.0 - 15.0 g/dL   HCT 26.7 (L) 36.0 - 46.0 %   MCV 89.0 80.0 - 100.0 fL   MCH 27.3 26.0 - 34.0 pg   MCHC 30.7 30.0 - 36.0 g/dL   RDW 16.5 (H) 11.5 - 15.5 %   Platelets 321 150 - 400 K/uL   nRBC 0.0 0.0 - 0.2 %   Neutrophils Relative % 76 %   Neutro Abs 4.3 1.7 - 7.7 K/uL   Lymphocytes Relative 11 %   Lymphs Abs 0.6 (L) 0.7 - 4.0 K/uL   Monocytes Relative 10 %   Monocytes Absolute 0.5 0.1 - 1.0 K/uL   Eosinophils Relative 2 %   Eosinophils Absolute 0.1 0.0 - 0.5 K/uL   Basophils Relative 1 %   Basophils Absolute 0.1 0.0 - 0.1 K/uL   Immature Granulocytes 0 %   Abs Immature Granulocytes 0.02 0.00 - 0.07 K/uL    Comment: Performed at [facility], [place], [place] [address]  Iron and TIBC     Status: Abnormal   Collection Time: 01/13/21  2:41 PM  Result Value Ref Range   Iron 23 (L) 28 - 170 ug/dL   TIBC 351 250 - 450 ug/dL   Saturation Ratios 7 (L) 10.4 - 31.8 %   UIBC 328 ug/dL    Comment: Performed at [facility], [place], [place] [address]  Ferritin     Status: None   Collection Time: 01/13/21  2:41 PM  Result Value Ref Range   Ferritin 67 11 - 307 ng/mL    Comment: Performed at [facility], [place], [place] [address]  SARS CORONAVIRUS 2 (TAT 6-24 HRS) Nasopharyngeal Nasopharyngeal Swab     Status: None   Collection Time: 01/27/21  9:59 AM   Specimen: Nasopharyngeal Swab  Result Value Ref Range   SARS Coronavirus 2 NEGATIVE NEGATIVE    Comment: (NOTE) SARS-CoV-2 target nucleic acids are NOT DETECTED.  The SARS-CoV-2 RNA is generally detectable in upper and lower respiratory specimens during the acute phase of infection. Negative results do not preclude SARS-CoV-2 infection, do not rule out co-infections with other pathogens, and should not be used as the sole basis for treatment or other patient management decisions. Negative results must be combined with clinical observations, patient history, and epidemiological information. The expected result is Negative.  Fact Sheet for Patients: SugarRoll.be  Fact Sheet for Healthcare Providers: https://www.woods-mathews.com/  This test is not yet approved or cleared by the [place] FDA and  has been authorized for detection and/or diagnosis of SARS-CoV-2 by FDA under an Emergency Use Authorization (EUA). This EUA will remain  in effect (meaning this test can be used) for the duration of the COVID-19 declaration under Se ction 564(b)(1) of the Act, 21 U.S.C. section 360bbb-3(b)(1), unless the authorization is terminated or revoked sooner.  Performed  at [facility], [place] [address], [place], [place] [address]     Radiology No results found.  Assessment/Plan  Pain in limb To evaluate her perfusion, ABIs were checked today showing normal triphasic waveforms with a right ABI of 1.2 and a left ABI 0.95 with normal digital pressures and waveforms bilaterally.  Perfusion does not appear to be contributing to her lower extremity pain.  Uncontrolled type 2 diabetes mellitus with hyperglycemia (HCC) blood glucose control important in reducing the progression of atherosclerotic disease. Also, involved in wound healing. On appropriate medications.   HTN (hypertension) blood pressure control important in reducing the progression of atherosclerotic disease. On appropriate oral medications.   Carotid stenosis Duplex today shows her left carotid endarterectomy to be widely patent without recurrent stenosis.  Right carotid stenosis is in the 1 to 39% range.  Doing well.  No change in medical regimen including antiplatelet and statin agents.  Recheck in 1 year.    Leotis Pain, MD  02/20/2021 11:32 AM    This note was created with Dragon medical transcription system.  Any errors from dictation are purely unintentional

## 2021-02-20 NOTE — Assessment & Plan Note (Signed)
Duplex today shows her left carotid endarterectomy to be widely patent without recurrent stenosis.  Right carotid stenosis is in the 1 to 39% range.  Doing well.  No change in medical regimen including antiplatelet and statin agents.  Recheck in 1 year.

## 2021-03-03 DIAGNOSIS — E1165 Type 2 diabetes mellitus with hyperglycemia: Secondary | ICD-10-CM | POA: Diagnosis not present

## 2021-03-05 ENCOUNTER — Encounter: Payer: Self-pay | Admitting: Gastroenterology

## 2021-03-05 ENCOUNTER — Telehealth (INDEPENDENT_AMBULATORY_CARE_PROVIDER_SITE_OTHER): Payer: Medicare HMO | Admitting: Gastroenterology

## 2021-03-05 DIAGNOSIS — R109 Unspecified abdominal pain: Secondary | ICD-10-CM

## 2021-03-05 DIAGNOSIS — R197 Diarrhea, unspecified: Secondary | ICD-10-CM

## 2021-03-05 DIAGNOSIS — D509 Iron deficiency anemia, unspecified: Secondary | ICD-10-CM | POA: Diagnosis not present

## 2021-03-05 NOTE — Progress Notes (Signed)
Joyce Antigua, MD [address]  [place]  [place], [place] [address]  Main: [phone]  Fax: [phone]   Primary Care Physician: [name], MD  Virtual Visit via Telephone Note Patient was unable to connect via [employer] video visit software and therefore this had to be changed to a phone visit  I connected with patient on 03/05/21 at  1:15 PM EST by telephone and verified that I am speaking with the correct person using two identifiers.   I discussed the limitations, risks, security and privacy concerns of performing an evaluation and management service by telephone and the availability of in person appointments. I also discussed with the patient that there may be a patient responsible charge related to this service. The patient expressed understanding and agreed to proceed.  Location of Patient: Home Location of Provider: Home Persons involved: Patient and provider only during the visit (nursing staff and front desk staff was involved in communicating with the patient prior to the appointment, reviewing medications and checking them in)   History of Present Illness: Chief Complaint  Patient presents with  . IDA     HPI: Joyce Potter is a 75 y.o. female here for follow-up of iron deficiency anemia.  Patient was supposed to have an abdominal x-ray done after her capsule study but for some reason this has not been done yet.  Patient denies any nausea or vomiting.  Does report 2 weeks of diarrhea with some cramping.  No blood in stool.  Current Outpatient Medications  Medication Sig Dispense Refill  . albuterol (VENTOLIN HFA) 108 (90 Base) MCG/ACT inhaler Inhale 2 puffs into the lungs every 4 (four) hours as needed for wheezing or shortness of breath.     Marland Kitchen alendronate (FOSAMAX) 70 MG tablet Take 70 mg by mouth once a week.     Marland Kitchen amitriptyline (ELAVIL) 25 MG tablet Take 25 mg by mouth at bedtime as needed for sleep.     Marland Kitchen aspirin EC 81 MG EC tablet Take 1  tablet (81 mg total) by mouth daily. Swallow whole. 30 tablet 11  . atorvastatin (LIPITOR) 20 MG tablet Take 20 mg by mouth daily.    . clopidogrel (PLAVIX) 75 MG tablet Take 1 tablet (75 mg total) by mouth daily with breakfast. 30 tablet 11  . Dulaglutide 1.5 MG/0.5ML SOPN Inject 1.5 mg into the skin every Saturday.     . DULoxetine (CYMBALTA) 20 MG capsule Take 20 mg by mouth 2 (two) times daily.    Marland Kitchen gabapentin (NEURONTIN) 300 MG capsule Take 300 mg by mouth 2 (two) times daily.     Marland Kitchen glucose monitoring kit (FREESTYLE) monitoring kit 1 each by Does not apply route as needed for other.    . insulin NPH-regular Human (NOVOLIN 70/30) (70-30) 100 UNIT/ML injection Inject 30 Units into the skin 2 (two) times daily with a meal.     . metoprolol tartrate (LOPRESSOR) 100 MG tablet Take 150 mg by mouth 2 (two) times daily.     Glory Rosebush VERIO test strip SMARTSIG:Via Meter    . saccharomyces boulardii (FLORASTOR) 250 MG capsule Take 250 mg by mouth 2 (two) times daily.     Marland Kitchen SPIRIVA HANDIHALER 18 MCG inhalation capsule Place 18 mcg into inhaler and inhale daily in the afternoon.     . traMADol (ULTRAM) 50 MG tablet Take 50 mg by mouth every 6 (six) hours as needed for moderate pain.      No current  facility-administered medications for this visit.   Facility-Administered Medications Ordered in Other Visits  Medication Dose Route Frequency Provider Last Rate Last Admin  . heparin lock flush 100 unit/mL  500 Units Intravenous Once Allen, Lauren G, NP      . sodium chloride flush (NS) 0.9 % injection 10 mL  10 mL Intravenous Once Allen, Lauren G, NP        Allergies as of 03/05/2021 - Review Complete 03/05/2021  Allergen Reaction Noted  . Sulfa antibiotics Other (See Comments) 04/13/2013  . Invokamet [canagliflozin-metformin hcl] Other (See Comments) 08/08/2015  . Lovastatin Rash 08/08/2015  . Penicillins Rash 04/13/2013  . Vicodin [hydrocodone-acetaminophen] Rash 04/13/2013    Review of  Systems:    All systems reviewed and negative except where noted in HPI.   Observations/Objective:  Labs: CMP     Component Value Date/Time   NA 141 07/24/2020 0417   NA 143 05/04/2013 0614   K 4.1 07/24/2020 0417   K 3.8 05/04/2013 0614   CL 104 07/24/2020 0417   CL 110 (H) 05/04/2013 0614   CO2 26 07/24/2020 0417   CO2 27 05/04/2013 0614   GLUCOSE 102 (H) 07/24/2020 0417   GLUCOSE 130 (H) 05/04/2013 0614   BUN 8 07/24/2020 0417   BUN 12 05/04/2013 0614   CREATININE 0.79 07/24/2020 0417   CREATININE 0.79 08/27/2014 1445   CALCIUM 8.5 (L) 07/24/2020 0417   CALCIUM 8.2 (L) 05/04/2013 0614   PROT 7.0 07/21/2020 1011   PROT 7.6 08/27/2014 1445   ALBUMIN 3.8 07/21/2020 1011   ALBUMIN 3.5 08/27/2014 1445   AST 18 07/21/2020 1011   AST 27 08/27/2014 1445   ALT 14 07/21/2020 1011   ALT 27 08/27/2014 1445   ALKPHOS 72 07/21/2020 1011   ALKPHOS 101 08/27/2014 1445   BILITOT 0.6 07/21/2020 1011   BILITOT 0.4 08/27/2014 1445   GFRNONAA >60 07/24/2020 0417   GFRNONAA >60 08/27/2014 1445   GFRAA >60 07/24/2020 0417   GFRAA >60 08/27/2014 1445   Lab Results  Component Value Date   WBC 5.6 01/13/2021   HGB 8.2 (L) 01/13/2021   HCT 26.7 (L) 01/13/2021   MCV 89.0 01/13/2021   PLT 321 01/13/2021    Imaging Studies: VAS US ABI WITH/WO TBI  Result Date: 02/20/2021 LOWER EXTREMITY DOPPLER STUDY Indications: Neuropathy. High Risk Factors: Hypertension.  Vascular Interventions: None. Comparison Study: 218/2020 Performing Technologist: Valarie Baldwin RT (R)(VS)  Examination Guidelines: A complete evaluation includes at minimum, Doppler waveform signals and systolic blood pressure reading at the level of bilateral brachial, anterior tibial, and posterior tibial arteries, when vessel segments are accessible. Bilateral testing is considered an integral part of a complete examination. Photoelectric Plethysmograph (PPG) waveforms and toe systolic pressure readings are included as required  and additional duplex testing as needed. Limited examinations for reoccurring indications may be performed as noted.  ABI Findings: +---------+------------------+-----+---------+--------+ Right    Rt Pressure (mmHg)IndexWaveform Comment  +---------+------------------+-----+---------+--------+ Brachial 146                                      +---------+------------------+-----+---------+--------+ ATA      180               1.21 triphasic         +---------+------------------+-----+---------+--------+ PTA      160               1.07   triphasic         +---------+------------------+-----+---------+--------+ Great Toe145               0.97 Normal            +---------+------------------+-----+---------+--------+ +---------+------------------+-----+---------+-------+ Left     Lt Pressure (mmHg)IndexWaveform Comment +---------+------------------+-----+---------+-------+ Brachial 149                                     +---------+------------------+-----+---------+-------+ ATA      126               0.85 triphasic        +---------+------------------+-----+---------+-------+ PTA      141               0.95 triphasic        +---------+------------------+-----+---------+-------+ Great Toe145               0.97 Normal           +---------+------------------+-----+---------+-------+ +-------+-----------+-----------+------------+------------+ ABI/TBIToday's ABIToday's TBIPrevious ABIPrevious TBI +-------+-----------+-----------+------------+------------+ Right  1.21       .97        1.06        .94          +-------+-----------+-----------+------------+------------+ Left   .95        .97        1.10        .93          +-------+-----------+-----------+------------+------------+ Bilateral ABIs appear essentially unchanged compared to prior study on 02/13/2019. Bilateral TBIs appear essentially unchanged compared to prior study on 02/13/2019.  Summary:  Right: Resting right ankle-brachial index is within normal range. No evidence of significant right lower extremity arterial disease. The right toe-brachial index is normal. Left: Resting left ankle-brachial index is within normal range. No evidence of significant left lower extremity arterial disease. The left toe-brachial index is normal. *See table(s) above for measurements and observations.  Electronically signed by Jason Dew MD on 02/20/2021 at 11:57:47 AM.   Final    VAS US CAROTID  Result Date: 02/20/2021 Carotid Arterial Duplex Study Indications:       Left CEA 10/01/2015. Risk Factors:      Hypertension. Comparison Study:  02/20/2020 Performing Technologist: Valarie Baldwin RT (R)(VS)  Examination Guidelines: A complete evaluation includes B-mode imaging, spectral Doppler, color Doppler, and power Doppler as needed of all accessible portions of each vessel. Bilateral testing is considered an integral part of a complete examination. Limited examinations for reoccurring indications may be performed as noted.  Right Carotid Findings: +----------+--------+--------+--------+------------------+-------------------+           PSV cm/sEDV cm/sStenosisPlaque DescriptionComments            +----------+--------+--------+--------+------------------+-------------------+ CCA Prox  84      1                                                     +----------+--------+--------+--------+------------------+-------------------+ CCA Mid   69      13                                                    +----------+--------+--------+--------+------------------+-------------------+   CCA Distal84      16              calcific                              +----------+--------+--------+--------+------------------+-------------------+ ICA Prox  70      22              calcific          ICA/CCA ratio = 1.0 +----------+--------+--------+--------+------------------+-------------------+ ICA Mid   83      26                                                     +----------+--------+--------+--------+------------------+-------------------+ ICA Distal68      19                                                    +----------+--------+--------+--------+------------------+-------------------+ ECA       260     13                                                    +----------+--------+--------+--------+------------------+-------------------+ +----------+--------+-------+-----------+-------------------+           PSV cm/sEDV cmsDescribe   Arm Pressure (mmHG) +----------+--------+-------+-----------+-------------------+ Subclavian151            Multiphasic                    +----------+--------+-------+-----------+-------------------+ +---------+--------+--+--------+-+---------+ VertebralPSV cm/s43EDV cm/s7Antegrade +---------+--------+--+--------+-+---------+ Left Carotid Findings: +----------+--------+--------+--------+------------------+--------------------+           PSV cm/sEDV cm/sStenosisPlaque DescriptionComments             +----------+--------+--------+--------+------------------+--------------------+ CCA Prox  97      16                                                     +----------+--------+--------+--------+------------------+--------------------+ CCA Mid   94      15                                intimal thickening   +----------+--------+--------+--------+------------------+--------------------+ CCA Distal62      9                                                      +----------+--------+--------+--------+------------------+--------------------+ ICA Prox  62      24                                ICA/CCA ratio = 1.12 +----------+--------+--------+--------+------------------+--------------------+ ICA Mid   105     24                                  tortuous              +----------+--------+--------+--------+------------------+--------------------+ ICA Distal70      15                                                     +----------+--------+--------+--------+------------------+--------------------+ ECA       133     7                                                      +----------+--------+--------+--------+------------------+--------------------+ +----------+--------+--------+-----------+-------------------+           PSV cm/sEDV cm/sDescribe   Arm Pressure (mmHG) +----------+--------+--------+-----------+-------------------+ [id number]             Multiphasic                    +----------+--------+--------+-----------+-------------------+ +---------+--------+--+--------+-+---------+ VertebralPSV cm/s54EDV cm/s8Antegrade +---------+--------+--+--------+-+---------+ Summary: Right Carotid: Velocities in the right ICA are consistent with a 1-39% stenosis.                The ECA appears >50% stenosed. Left Carotid: Velocities in the left ICA are consistent with a 1-39% stenosis.               The ECA appears <50% stenosed. Patent left ICA endarterectomy with               no evidence of restenosis. Vertebrals:  Bilateral vertebral arteries demonstrate antegrade flow. Subclavians: Normal flow hemodynamics were seen in bilateral subclavian              arteries. *See table(s) above for measurements and observations.  Electronically signed by Leotis Pain MD on 02/20/2021 at 11:57:52 AM.    Final     Assessment and Plan:   Joyce Potter is a 75 y.o. y/o female here for follow-up of iron deficiency anemia  Assessment and Plan: Capsule did not reach cecum on last exam Abdominal x-ray was recommended but was never done Abdominal x-ray today  Patient also reporting new diarrhea over the last 2 weeks, rule out infectious etiology with GI profile Repeat hemoglobin at this time as well, and if continuing to drop, may need to consider repeat  upper or lower endoscopy, with possible placement of capsule during the procedure  Follow Up Instructions:    I discussed the assessment and treatment plan with the patient. The patient was provided an opportunity to ask questions and all were answered. The patient agreed with the plan and demonstrated an understanding of the instructions.   The patient was advised to call back or seek an in-person evaluation if the symptoms worsen or if the condition fails to improve as anticipated.  I provided 22 minutes of non-face-to-face time during this encounter. Additional time was spent in reviewing patient's chart, placing orders etc.   Virgel Manifold, MD  Speech recognition software was used to dictate this note.

## 2021-03-06 ENCOUNTER — Ambulatory Visit
Admission: RE | Admit: 2021-03-06 | Discharge: 2021-03-06 | Disposition: A | Discharge status: Home / Self Care | Payer: Medicare HMO | Attending: Gastroenterology | Admitting: Gastroenterology

## 2021-03-06 ENCOUNTER — Ambulatory Visit
Admission: RE | Admit: 2021-03-06 | Discharge: 2021-03-06 | Disposition: A | Discharge status: Home / Self Care | Payer: Medicare HMO | Source: Ambulatory Visit | Attending: Gastroenterology | Admitting: Gastroenterology

## 2021-03-06 DIAGNOSIS — R109 Unspecified abdominal pain: Secondary | ICD-10-CM | POA: Diagnosis not present

## 2021-03-10 DIAGNOSIS — I10 Essential (primary) hypertension: Secondary | ICD-10-CM | POA: Diagnosis not present

## 2021-03-10 DIAGNOSIS — R197 Diarrhea, unspecified: Secondary | ICD-10-CM | POA: Diagnosis not present

## 2021-03-10 DIAGNOSIS — E782 Mixed hyperlipidemia: Secondary | ICD-10-CM | POA: Diagnosis not present

## 2021-03-10 DIAGNOSIS — K219 Gastro-esophageal reflux disease without esophagitis: Secondary | ICD-10-CM | POA: Diagnosis not present

## 2021-03-10 DIAGNOSIS — E118 Type 2 diabetes mellitus with unspecified complications: Secondary | ICD-10-CM | POA: Diagnosis not present

## 2021-03-10 DIAGNOSIS — R109 Unspecified abdominal pain: Secondary | ICD-10-CM | POA: Diagnosis not present

## 2021-03-11 DIAGNOSIS — R197 Diarrhea, unspecified: Secondary | ICD-10-CM | POA: Diagnosis not present

## 2021-03-11 DIAGNOSIS — E118 Type 2 diabetes mellitus with unspecified complications: Secondary | ICD-10-CM | POA: Diagnosis not present

## 2021-03-13 DIAGNOSIS — R109 Unspecified abdominal pain: Secondary | ICD-10-CM | POA: Diagnosis not present

## 2021-03-13 DIAGNOSIS — G4733 Obstructive sleep apnea (adult) (pediatric): Secondary | ICD-10-CM | POA: Diagnosis not present

## 2021-03-19 ENCOUNTER — Other Ambulatory Visit: Payer: Self-pay | Admitting: *Deleted

## 2021-03-19 ENCOUNTER — Telehealth: Payer: Self-pay

## 2021-03-19 DIAGNOSIS — R109 Unspecified abdominal pain: Secondary | ICD-10-CM | POA: Diagnosis not present

## 2021-03-19 DIAGNOSIS — D500 Iron deficiency anemia secondary to blood loss (chronic): Secondary | ICD-10-CM | POA: Diagnosis not present

## 2021-03-19 DIAGNOSIS — D509 Iron deficiency anemia, unspecified: Secondary | ICD-10-CM | POA: Diagnosis not present

## 2021-03-19 NOTE — Patient Outreach (Signed)
[facility]) Care Management  03/19/2021  DESHUNDRA WALLER [birth year] [id number]   [facility] Unsuccessful outreach  Mrs Vanderhoef was referred to [facility] on 05/29/20 for EMMI general discharge (on APL) after her discharge from [place] ALERTforDay #4,Wednesday 05/28/20 103forUnfilled prescriptions. This EMMI was addressed on 06/03/20 when was reach via phone [facility] RN CM consulted with [facility] pharmacy staff, Dr Ralene Muskrat alsoreceived a pharmacy referral for Lantus and Spiriva from the patient's MD officein June 2021 Mrs Mimbs had been sent patient assistance applications to her home address for Spiriva  Insurance:[facility] x4ED visits x3in the last 6 months Last admission on7/26/21 to 07/24/20 for acute blood loss anemia/ EGD - sent to ED for bleeding, generalized weakness, dark stools for a couple weeks Hgb 6.2 06/12/20-06/15/20 for GI bleeding secondary to angiodysplastic lesion 05/20/20 to 05/23/20 [place] bleeding and anemia 05/15/20 to 05/16/20 CAD chest pain  Last successful outreach 02/19/21  Outreach attempt to the home number  No answer. [facility] RN CM left HIPAA [facility] Portability and Accountability Act) compliant voicemail message along with CM's contact info.   Plan: [facility] RN CM scheduled this patient for another call attempt within 4-7 business days  Cabrini Ruggieri L. [name], RN, BSN, [place] Coordinator Office number [phone] Mobile number [phone]  Main [facility] number [phone] Fax number [phone]

## 2021-03-19 NOTE — Telephone Encounter (Signed)
Patient verbalized understanding of results and states she will go to [facility] to get this done

## 2021-03-19 NOTE — Telephone Encounter (Signed)
-----   Message from Virgel Manifold, MD sent at 03/17/2021 12:52 PM EDT ----- Herb Grays please let the patient know, her x-ray was normal.  However, we had ordered stool test and labs on last visit.  I do not see these done.  Please ask her to get these done

## 2021-03-20 LAB — HEMOGLOBIN: Hemoglobin: 11.9 g/dL (ref 11.1–15.9)

## 2021-03-23 LAB — GI PROFILE, STOOL, PCR

## 2021-03-25 ENCOUNTER — Other Ambulatory Visit: Payer: Self-pay | Admitting: Internal Medicine

## 2021-03-25 DIAGNOSIS — Z1231 Encounter for screening mammogram for malignant neoplasm of breast: Secondary | ICD-10-CM

## 2021-03-26 NOTE — Progress Notes (Signed)
Your results show infection with norovirus. This is the cause of your diarrhea. I would recommend hydrating well with water, good nutrition, and this is likely to resolve over the next few weeks. Please let us know if diarrhea continues after 1-2 weeks. Wash and cook your food well. Wash your hands well while cooking and eating. And do not share utensils or food while you are recovering.

## 2021-03-30 ENCOUNTER — Other Ambulatory Visit: Payer: Self-pay

## 2021-03-30 ENCOUNTER — Encounter: Payer: Self-pay | Admitting: Gastroenterology

## 2021-03-30 ENCOUNTER — Other Ambulatory Visit: Payer: Self-pay | Admitting: *Deleted

## 2021-03-30 ENCOUNTER — Encounter: Payer: Self-pay | Admitting: *Deleted

## 2021-03-30 DIAGNOSIS — E113293 Type 2 diabetes mellitus with mild nonproliferative diabetic retinopathy without macular edema, bilateral: Secondary | ICD-10-CM | POA: Diagnosis not present

## 2021-03-30 NOTE — Patient Outreach (Signed)
[facility]) Care Management  03/30/2021  [name] [birth year] [id number]    [facility] outreach to complex care patient  Joyce Potter was referred to [facility] on 05/29/20 for EMMI general discharge (on APL) after her discharge from [place] ALERTforDay #4,Wednesday 05/28/20 1089forUnfilled prescriptions. This EMMI was addressed on 06/03/20 when was reach via phone [facility] RN CM consulted with [facility] pharmacy staff, Dr Ralene Muskrat alsoreceived a pharmacy referral for Lantus and Spiriva from the patient's MD officein June 2021 Joyce Potter had been sent patient assistance applications to her home address for Spiriva  Insurance:[facility] & ED visits Last admission on7/26/21 to 07/24/20 for acute blood loss anemia/ EGD - sent to ED for bleeding, generalized weakness, dark stools for a couple weeks Hgb 6.2 06/12/20-06/15/20 for GI bleeding secondary to angiodysplastic lesion 05/20/20 to 05/23/20 [place] bleeding and anemia 05/15/20 to 05/16/20 CAD chest pain  Last successful outreach 03/19/21  Follow up Joyce Potter states she is doing good overall She reports her hemoglobin is stable but her iron remains low   She recently developed abdominal pain, gas and frequent stools after greeting family during a church event. She was evaluated and diagnosed with pancreatitis & norovirus after labs and imaging on 03/06/21 & 03/19/21 She confirms not history of alcohol intake  Sees her pcp 03/31/21 for follow up  Reviewed 03/06/21 CT, answered questions and referred to MD for further discussion  Her CT showed also healed ribs Pt reports she fell getting out of her tub > than a year ago and broke 3 ribs. This incident was not recent and she denies recent falls in the last 10 months   Reviewed pancreatitis, norovirus and c. difficile definitions, causes, symptoms and what to expect for treatment with patient  Questions answered  EMMI  education information sent to her e-mail address listed in EPIC for pancreatitis, norovirus , colonoscopy, endoscopy, CT scan, lymphoma, leukemia, hypertension   Breast exam upcoming 05/11/21 She is a organ donor   Hypertension (HTN) Recent home monitored values of 97/43 150/70 She reports her blood pressure (BP) goes up and down    Diabetes  Continues use of the [employer] She confirms alarms for lows like 69 and highs  Her CBG range has been 69, 97, 123 this am  She wants to find out from her MD various other areas to place her Libre sensor  Hg A1c was 5.9 on 01/09/21  She cares for her husband, Joyce Potter who has lymphoma and continues to be monitored by his oncologist now on a 4 month basis He is having some pain and will be seeing his pcp  Plans Joyce Flewellen agrees to follow up within the next 30 business days Pt encouraged to return a call to [facility] RN CM prn Goals      Patient Stated   .  [facility]) Anemia Prevented or Managed (pt-stated)      Follow up 03/28/21  Notes: 03/30/21 continues infusions and MD follow ups Voiced Hgb good but iron/ferritin changes seen monthly  03/19/21 unsuccessful outreach 02/17/23 doing well assessed HTN, Depression husband sick 01/19/21 Hgb from 9.1 to 7.4 but still scheduled for endoscopy on 01/29/21 hx of 2 lower polyps cauterized with good results, hx of hysterectomy/no bleeding 01/05/21 low platelets, TIBC, iron, to start infusions 11/24/20 intravenous iron not needed since September 2021  Promoted use of iron-fortified foods      .  [facility]) Learn More About My Health (  pt-stated)      Follow Up Date 03/28/21   - tell my story and reason for my visit - ask questions - speak up when I don't understand      Notes: 03/30/21 asked questions and shared her story related to CT, pancreatitis, norovirus, 03/19/21 unsuccessful outreach 02/19/23 doing well  01/19/21 learned about use of new freestyle from endocrinologist today with  use of her android phone for  monitoring    .  [facility]) Track and Manage My Blood Pressure-Hypertension (pt-stated)      Timeframe:  Short-Term Goal Priority:  Medium Start Date:                   01/19/21          Expected End Date:          05/19/21             Follow Up Date 03/28/21    - check blood pressure weekly - choose a place to take my blood pressure (home, clinic or office, retail store)     Notes: 03/30/21 continues with home BP monitoring range 97/43-150/70 03/19/21 unsuccessful outreach 02/19/23 doing well assessed HTN, Depression       Jacque Garrels L. [name], RN, BSN, University Coordinator Office number [phone] Main [facility] number [phone] Fax number [phone]

## 2021-03-31 DIAGNOSIS — E118 Type 2 diabetes mellitus with unspecified complications: Secondary | ICD-10-CM | POA: Diagnosis not present

## 2021-03-31 DIAGNOSIS — I2510 Atherosclerotic heart disease of native coronary artery without angina pectoris: Secondary | ICD-10-CM | POA: Diagnosis not present

## 2021-03-31 DIAGNOSIS — E782 Mixed hyperlipidemia: Secondary | ICD-10-CM | POA: Diagnosis not present

## 2021-03-31 DIAGNOSIS — G4733 Obstructive sleep apnea (adult) (pediatric): Secondary | ICD-10-CM | POA: Diagnosis not present

## 2021-03-31 DIAGNOSIS — I10 Essential (primary) hypertension: Secondary | ICD-10-CM | POA: Diagnosis not present

## 2021-04-02 DIAGNOSIS — I10 Essential (primary) hypertension: Secondary | ICD-10-CM | POA: Diagnosis not present

## 2021-04-02 DIAGNOSIS — I350 Nonrheumatic aortic (valve) stenosis: Secondary | ICD-10-CM | POA: Diagnosis not present

## 2021-04-02 DIAGNOSIS — R002 Palpitations: Secondary | ICD-10-CM | POA: Diagnosis not present

## 2021-04-02 DIAGNOSIS — R1013 Epigastric pain: Secondary | ICD-10-CM | POA: Diagnosis not present

## 2021-04-02 DIAGNOSIS — G4733 Obstructive sleep apnea (adult) (pediatric): Secondary | ICD-10-CM | POA: Diagnosis not present

## 2021-04-02 DIAGNOSIS — E1165 Type 2 diabetes mellitus with hyperglycemia: Secondary | ICD-10-CM | POA: Diagnosis not present

## 2021-04-02 DIAGNOSIS — K5521 Angiodysplasia of colon with hemorrhage: Secondary | ICD-10-CM | POA: Diagnosis not present

## 2021-04-02 DIAGNOSIS — I2510 Atherosclerotic heart disease of native coronary artery without angina pectoris: Secondary | ICD-10-CM | POA: Diagnosis not present

## 2021-04-02 DIAGNOSIS — I739 Peripheral vascular disease, unspecified: Secondary | ICD-10-CM | POA: Diagnosis not present

## 2021-04-02 DIAGNOSIS — I700 Atherosclerosis of aorta: Secondary | ICD-10-CM | POA: Diagnosis not present

## 2021-04-04 NOTE — Progress Notes (Signed)
[facility]  [phone] [phone]) [phone]  ID: Joyce Potter OB: 1946/03/21  MR#: [id number]  EGB#:[id number]  Patient Care Team: [name], MD as PCP - General (Internal Medicine) Lloyd Huger, MD as Consulting Physician (Hematology and Oncology) Barbaraann Faster, RN as [place] Management Fath, Javier Docker, MD as Consulting Physician (Cardiology) Solum, Betsey Holiday, MD as Physician Assistant (Endocrinology) Virgel Manifold, MD as Consulting Physician (Gastroenterology) Jacquelin Hawking, NP as Nurse Practitioner (Oncology)  CHIEF COMPLAINT: Iron deficiency anemia, secondary to chronic blood loss.  INTERVAL HISTORY: Patient returns to clinic today for repeat laboratory work, further evaluation, and consideration of additional IV Venofer.  She currently feels well and is asymptomatic.  She does not complain of any weakness or fatigue today. She has no neurologic complaints.  She has a good appetite and denies weight loss.  She denies any recent fevers or illnesses.  She denies chest pain, shortness of breath, cough or hemoptysis. She denies any nausea, vomiting, constipation, or diarrhea.  She denies any further melena or hematochezia.  She has no urinary complaints.  Patient offers no specific complaints today.  REVIEW OF SYSTEMS:   Review of Systems  Constitutional: Negative.  Negative for diaphoresis, fever, malaise/fatigue and weight loss.  Eyes: Negative.  Negative for blurred vision.  Respiratory: Negative.  Negative for cough, hemoptysis, shortness of breath and wheezing.   Cardiovascular: Negative.  Negative for chest pain and leg swelling.  Gastrointestinal: Negative.  Negative for abdominal pain, blood in stool and melena.  Genitourinary: Negative.  Negative for flank pain and hematuria.  Musculoskeletal: Negative.  Negative for falls and joint pain.  Skin: Negative.  Negative for rash.  Neurological:  Negative.  Negative for dizziness, focal weakness, weakness and headaches.  Psychiatric/Behavioral: Negative.  The patient is not nervous/anxious.    As per HPI. Otherwise, a complete review of systems is negative.  PAST MEDICAL HISTORY: Past Medical History:  Diagnosis Date  . Anemia    iron deficiency  . Aortic stenosis   . Basal cell carcinoma   . CAD (coronary artery disease)    s/p Left circumflex stent in 2012  . Carotid stenosis   . Cataract   . High cholesterol   . HTN (hypertension)   . Hyperlipidemia   . IDDM (insulin dependent diabetes mellitus)   . Multiple thyroid nodules    Benign  . Peptic ulcer disease   . Retinal artery occlusion    on left    PAST SURGICAL HISTORY: Past Surgical History:  Procedure Laterality Date  . ABDOMINAL HYSTERECTOMY    . ARTERY BIOPSY Left 11/19/2015   Procedure: BIOPSY TEMPORAL ARTERY;  Surgeon: Algernon Huxley, MD;  Location: [facility] ORS;  Service: Vascular;  Laterality: Left;  . BREAST BIOPSY Left 2002   core- neg  . BREAST EXCISIONAL BIOPSY    . CARDIAC CATHETERIZATION N/A 08/08/2015   Procedure: Right and Left Heart Cath and Coronary Angiography;  Surgeon: Teodoro Spray, MD;  Location: [place] CV LAB;  Service: Cardiovascular;  Laterality: N/A;  . CARDIAC CATHETERIZATION Bilateral 09/03/2016   Procedure: Right/Left Heart Cath and Coronary Angiography;  Surgeon: Teodoro Spray, MD;  Location: [place] CV LAB;  Service: Cardiovascular;  Laterality: Bilateral;  . CATARACT EXTRACTION W/ INTRAOCULAR LENS  IMPLANT, BILATERAL    . COLONOSCOPY     in 2013- internal hemorrhoids  . COLONOSCOPY WITH PROPOFOL N/A 07/07/2018   Procedure: COLONOSCOPY WITH PROPOFOL;  Surgeon:  Manya Silvas, MD;  Location: [facility] ENDOSCOPY;  Service: Endoscopy;  Laterality: N/A;  . COLONOSCOPY WITH PROPOFOL N/A 05/22/2020   Procedure: COLONOSCOPY WITH PROPOFOL;  Surgeon: Jonathon Bellows, MD;  Location: [facility] ENDOSCOPY;  Service: Gastroenterology;  Laterality:  N/A;  . CORONARY ANGIOGRAPHY N/A 09/14/2017   Procedure: CORONARY ANGIOGRAPHY;  Surgeon: Teodoro Spray, MD;  Location: [employer] CV LAB;  Service: Cardiovascular;  Laterality: N/A;  . CORONARY ANGIOPLASTY    . CORONARY STENT INTERVENTION N/A 05/15/2020   Procedure: coronary angiography;  Surgeon: Teodoro Spray, MD;  Location: [place] CV LAB;  Service: Cardiovascular;  Laterality: N/A;  . CORONARY STENT INTERVENTION N/A 05/15/2020   Procedure: CORONARY STENT INTERVENTION;  Surgeon: Isaias Cowman, MD;  Location: [place] CV LAB;  Service: Cardiovascular;  Laterality: N/A;  . CORONARY STENT PLACEMENT    . ENDARTERECTOMY Left 10/01/2015   Procedure: ENDARTERECTOMY CAROTID;  Surgeon: Algernon Huxley, MD;  Location: [facility] ORS;  Service: Vascular;  Laterality: Left;  . ENTEROSCOPY N/A 07/24/2020   Procedure: ENTEROSCOPY;  Surgeon: Virgel Manifold, MD;  Location: [facility] ENDOSCOPY;  Service: Endoscopy;  Laterality: N/A;  . ESOPHAGOGASTRODUODENOSCOPY    . ESOPHAGOGASTRODUODENOSCOPY (EGD) WITH PROPOFOL N/A 05/31/2016   Procedure: ESOPHAGOGASTRODUODENOSCOPY (EGD) WITH PROPOFOL;  Surgeon: Manya Silvas, MD;  Location: [facility] ENDOSCOPY;  Service: Endoscopy;  Laterality: N/A;  . ESOPHAGOGASTRODUODENOSCOPY (EGD) WITH PROPOFOL N/A 07/07/2018   Procedure: ESOPHAGOGASTRODUODENOSCOPY (EGD) WITH PROPOFOL;  Surgeon: Manya Silvas, MD;  Location: [facility] ENDOSCOPY;  Service: Endoscopy;  Laterality: N/A;  . ESOPHAGOGASTRODUODENOSCOPY (EGD) WITH PROPOFOL N/A 07/17/2018   Procedure: ESOPHAGOGASTRODUODENOSCOPY (EGD) WITH PROPOFOL;  Surgeon: Lucilla Lame, MD;  Location: [facility] ENDOSCOPY;  Service: Endoscopy;  Laterality: N/A;  . ESOPHAGOGASTRODUODENOSCOPY (EGD) WITH PROPOFOL N/A 05/21/2020   Procedure: ESOPHAGOGASTRODUODENOSCOPY (EGD) WITH PROPOFOL;  Surgeon: Jonathon Bellows, MD;  Location: [facility] ENDOSCOPY;  Service: Gastroenterology;  Laterality: N/A;  . ESOPHAGOGASTRODUODENOSCOPY (EGD) WITH PROPOFOL N/A  06/13/2020   Procedure: ESOPHAGOGASTRODUODENOSCOPY (EGD) WITH PROPOFOL;  Surgeon: Lucilla Lame, MD;  Location: [facility] ENDOSCOPY;  Service: Endoscopy;  Laterality: N/A;  . EYE SURGERY    . GIVENS CAPSULE STUDY N/A 04/17/2013   Procedure: GIVENS CAPSULE STUDY;  Surgeon: Arta Silence, MD;  Location: [facility] ENDOSCOPY;  Service: Endoscopy;  Laterality: N/A;  patient ate breakfast at 7am   . GIVENS CAPSULE STUDY N/A 06/11/2020   Procedure: GIVENS CAPSULE STUDY;  Surgeon: Virgel Manifold, MD;  Location: [facility] ENDOSCOPY;  Service: Endoscopy;  Laterality: N/A;  . GIVENS CAPSULE STUDY N/A 01/29/2021   Procedure: GIVENS CAPSULE STUDY;  Surgeon: Virgel Manifold, MD;  Location: [facility] ENDOSCOPY;  Service: Endoscopy;  Laterality: N/A;  . HEMORRHOID BANDING  07/27/2016   Dr Nicholes Stairs  . PARTIAL HYSTERECTOMY    . RIGHT AND LEFT HEART CATH Bilateral 09/14/2017   Procedure: RIGHT AND LEFT HEART CATH;  Surgeon: Teodoro Spray, MD;  Location: [place] CV LAB;  Service: Cardiovascular;  Laterality: Bilateral;  . RIGHT/LEFT HEART CATH AND CORONARY ANGIOGRAPHY Right 05/15/2020   Procedure: Right/Left Heart cath;  Surgeon: Teodoro Spray, MD;  Location: [place] CV LAB;  Service: Cardiovascular;  Laterality: Right;  . TRIGGER FINGER RELEASE      FAMILY HISTORY Family History  Problem Relation Age of Onset  . Uterine cancer Mother   . Breast cancer Mother 65  . Seizures Father   . Stroke Father   . Diabetes Father   . COPD Father   . Colon cancer Neg Hx  ADVANCED DIRECTIVES:    HEALTH MAINTENANCE: Social History   Tobacco Use  . Smoking status: Former Smoker    Quit date: 02/15/1985    Years since quitting: 36.1  . Smokeless tobacco: Never Used  . Tobacco comment: quit about 31 years ago  Vaping Use  . Vaping Use: Never used  Substance Use Topics  . Alcohol use: No    Alcohol/week: 0.0 standard drinks  . Drug use: No    Colonoscopy:  PAP:  Bone density: Osteoporosis- DEXA 06/2017  (on fosamax)  Lipid panel:  Allergies  Allergen Reactions  . Sulfa Antibiotics Other (See Comments)    Altered Mental Status  . Invokamet [Canagliflozin-Metformin Hcl] Other (See Comments)    Yeast Infection  . Lovastatin Rash  . Penicillins Rash    Has patient had a PCN reaction causing immediate rash, facial/tongue/throat swelling, SOB or lightheadedness with hypotension:Yes Has patient had a PCN reaction causing severe rash involving mucus membranes or skin necrosis:all over body Has patient had a PCN reaction that required hospitalization: No Has patient had a PCN reaction occurring within the last 10 years: No If all of the above answers are "NO", then may proceed with Cephalosporin use.   . Vicodin [Hydrocodone-Acetaminophen] Rash    Current Outpatient Medications  Medication Sig Dispense Refill  . albuterol (VENTOLIN HFA) 108 (90 Base) MCG/ACT inhaler Inhale 2 puffs into the lungs every 4 (four) hours as needed for wheezing or shortness of breath.     Marland Kitchen alendronate (FOSAMAX) 70 MG tablet Take 70 mg by mouth once a week.     Marland Kitchen amitriptyline (ELAVIL) 25 MG tablet Take 25 mg by mouth at bedtime as needed for sleep.     Marland Kitchen aspirin EC 81 MG EC tablet Take 1 tablet (81 mg total) by mouth daily. Swallow whole. 30 tablet 11  . atorvastatin (LIPITOR) 20 MG tablet Take 20 mg by mouth daily.    . clopidogrel (PLAVIX) 75 MG tablet Take 1 tablet (75 mg total) by mouth daily with breakfast. 30 tablet 11  . Dulaglutide 1.5 MG/0.5ML SOPN Inject 1.5 mg into the skin every Saturday.     . DULoxetine (CYMBALTA) 20 MG capsule Take 20 mg by mouth 2 (two) times daily.    Marland Kitchen gabapentin (NEURONTIN) 300 MG capsule Take 300 mg by mouth 2 (two) times daily.     Marland Kitchen glucose monitoring kit (FREESTYLE) monitoring kit 1 each by Does not apply route as needed for other.    . insulin NPH-regular Human (NOVOLIN 70/30) (70-30) 100 UNIT/ML injection Inject 30 Units into the skin 2 (two) times daily with a meal.      . metoprolol tartrate (LOPRESSOR) 100 MG tablet Take 150 mg by mouth 2 (two) times daily.     Glory Rosebush VERIO test strip SMARTSIG:Via Meter    . saccharomyces boulardii (FLORASTOR) 250 MG capsule Take 250 mg by mouth 2 (two) times daily.     Marland Kitchen SPIRIVA HANDIHALER 18 MCG inhalation capsule Place 18 mcg into inhaler and inhale daily in the afternoon.     . traMADol (ULTRAM) 50 MG tablet Take 50 mg by mouth every 6 (six) hours as needed for moderate pain.      No current facility-administered medications for this visit.   Facility-Administered Medications Ordered in Other Visits  Medication Dose Route Frequency Provider Last Rate Last Admin  . heparin lock flush 100 unit/mL  500 Units Intravenous Once Verlon Au, NP      .  sodium chloride flush (NS) 0.9 % injection 10 mL  10 mL Intravenous Once Verlon Au, NP        OBJECTIVE: Vitals:   04/09/21 1403  BP: (!) 137/95  Pulse: 72  Temp: (!) 96.1 F (35.6 C)  SpO2: 98%     Body mass index is 34.31 kg/m.    ECOG FS:0 - Asymptomatic  General: Well-developed, well-nourished, no acute distress. Eyes: Pink conjunctiva, anicteric sclera. HEENT: Normocephalic, moist mucous membranes. Lungs: No audible wheezing or coughing. Heart: Regular rate and rhythm. Abdomen: Soft, nontender, no obvious distention. Musculoskeletal: No edema, cyanosis, or clubbing. Neuro: Alert, answering all questions appropriately. Cranial nerves grossly intact. Skin: No rashes or petechiae noted. Psych: Normal affect.   LAB RESULTS:  Lab Results  Component Value Date   NA 141 07/24/2020   K 4.1 07/24/2020   CL 104 07/24/2020   CO2 26 07/24/2020   GLUCOSE 102 (H) 07/24/2020   BUN 8 07/24/2020   CREATININE 0.79 07/24/2020   CALCIUM 8.5 (L) 07/24/2020   PROT 7.0 07/21/2020   ALBUMIN 3.8 07/21/2020   AST 18 07/21/2020   ALT 14 07/21/2020   ALKPHOS 72 07/21/2020   BILITOT 0.6 07/21/2020   GFRNONAA >60 07/24/2020   GFRAA >60 07/24/2020    Lab  Results  Component Value Date   WBC 5.0 04/07/2021   NEUTROABS 2.4 04/07/2021   HGB 12.2 04/07/2021   HCT 38.9 04/07/2021   MCV 80.7 04/07/2021   PLT 255 04/07/2021   Lab Results  Component Value Date   IRON 41 04/07/2021   TIBC 353 04/07/2021   IRONPCTSAT 12 04/07/2021    Lab Results  Component Value Date   FERRITIN 22 04/07/2021     STUDIES: No results found.  ASSESSMENT: Iron deficiency anemia, secondary to chronic blood loss.  PLAN:    1. Iron deficiency anemia: Patient's hemoglobin and iron stores are now within normal limits.  Patient recently underwent capsule endoscopy in February 2022 that only revealed 1 nonbleeding lymphangiectasia.  Prior to that patient's most recent luminal evaluation was in July 2021. Bone marrow biopsy on February 15, 2017 did not reveal any significant pathology.  Patient does not require additional IV Venofer today.  Patient last received IV Venofer on January 21, 2021.  Return to clinic in 3 months with repeat laboratory, further evaluation, and continuation of treatment if needed.   2.  Melena: Patient does not complain of this today.  Continue follow-up with GI as scheduled.  I spent a total of 20 minutes reviewing chart data, face-to-face evaluation with the patient, counseling and coordination of care as detailed above.    Patient expressed understanding and was in agreement with this plan. She also understands that She can call clinic at any time with any questions, concerns, or complaints.    Lloyd Huger, MD 04/10/21 9:04 AM

## 2021-04-07 ENCOUNTER — Inpatient Hospital Stay: Payer: Medicare HMO | Attending: Oncology

## 2021-04-07 DIAGNOSIS — D509 Iron deficiency anemia, unspecified: Secondary | ICD-10-CM | POA: Insufficient documentation

## 2021-04-07 DIAGNOSIS — Z87891 Personal history of nicotine dependence: Secondary | ICD-10-CM | POA: Diagnosis not present

## 2021-04-07 DIAGNOSIS — D500 Iron deficiency anemia secondary to blood loss (chronic): Secondary | ICD-10-CM

## 2021-04-07 LAB — CBC WITH DIFFERENTIAL/PLATELET
Abs Immature Granulocytes: 0.01 10*3/uL (ref 0.00–0.07)
Basophils Absolute: 0.1 10*3/uL (ref 0.0–0.1)
Basophils Relative: 1 %
Eosinophils Absolute: 0.3 10*3/uL (ref 0.0–0.5)
Eosinophils Relative: 5 %
HCT: 38.9 % (ref 36.0–46.0)
Hemoglobin: 12.2 g/dL (ref 12.0–15.0)
Immature Granulocytes: 0 %
Lymphocytes Relative: 33 %
Lymphs Abs: 1.6 10*3/uL (ref 0.7–4.0)
MCH: 25.3 pg — ABNORMAL LOW (ref 26.0–34.0)
MCHC: 31.4 g/dL (ref 30.0–36.0)
MCV: 80.7 fL (ref 80.0–100.0)
Monocytes Absolute: 0.6 10*3/uL (ref 0.1–1.0)
Monocytes Relative: 12 %
Neutro Abs: 2.4 10*3/uL (ref 1.7–7.7)
Neutrophils Relative %: 49 %
Platelets: 255 10*3/uL (ref 150–400)
RBC: 4.82 MIL/uL (ref 3.87–5.11)
RDW: 16.6 % — ABNORMAL HIGH (ref 11.5–15.5)
WBC: 5 10*3/uL (ref 4.0–10.5)
nRBC: 0 % (ref 0.0–0.2)

## 2021-04-07 LAB — IRON AND TIBC
Iron: 41 ug/dL (ref 28–170)
Saturation Ratios: 12 % (ref 10.4–31.8)
TIBC: 353 ug/dL (ref 250–450)
UIBC: 312 ug/dL

## 2021-04-07 LAB — FERRITIN: Ferritin: 22 ng/mL (ref 11–307)

## 2021-04-09 ENCOUNTER — Inpatient Hospital Stay: Payer: Medicare HMO | Admitting: Oncology

## 2021-04-09 ENCOUNTER — Inpatient Hospital Stay: Payer: Medicare HMO

## 2021-04-09 ENCOUNTER — Other Ambulatory Visit: Payer: Self-pay

## 2021-04-09 ENCOUNTER — Encounter: Payer: Self-pay | Admitting: Oncology

## 2021-04-09 VITALS — BP 137/95 | HR 72 | Temp 96.1°F | Ht 63.0 in | Wt 193.7 lb

## 2021-04-09 DIAGNOSIS — Z87891 Personal history of nicotine dependence: Secondary | ICD-10-CM | POA: Diagnosis not present

## 2021-04-09 DIAGNOSIS — D509 Iron deficiency anemia, unspecified: Secondary | ICD-10-CM | POA: Diagnosis not present

## 2021-04-13 DIAGNOSIS — G4733 Obstructive sleep apnea (adult) (pediatric): Secondary | ICD-10-CM | POA: Diagnosis not present

## 2021-04-20 DIAGNOSIS — Z794 Long term (current) use of insulin: Secondary | ICD-10-CM | POA: Diagnosis not present

## 2021-04-20 DIAGNOSIS — M810 Age-related osteoporosis without current pathological fracture: Secondary | ICD-10-CM | POA: Diagnosis not present

## 2021-04-20 DIAGNOSIS — E1159 Type 2 diabetes mellitus with other circulatory complications: Secondary | ICD-10-CM | POA: Diagnosis not present

## 2021-04-20 DIAGNOSIS — I10 Essential (primary) hypertension: Secondary | ICD-10-CM | POA: Diagnosis not present

## 2021-04-20 DIAGNOSIS — E1169 Type 2 diabetes mellitus with other specified complication: Secondary | ICD-10-CM | POA: Diagnosis not present

## 2021-04-20 DIAGNOSIS — E669 Obesity, unspecified: Secondary | ICD-10-CM | POA: Diagnosis not present

## 2021-04-20 DIAGNOSIS — E1129 Type 2 diabetes mellitus with other diabetic kidney complication: Secondary | ICD-10-CM | POA: Diagnosis not present

## 2021-04-20 DIAGNOSIS — R809 Proteinuria, unspecified: Secondary | ICD-10-CM | POA: Diagnosis not present

## 2021-04-20 DIAGNOSIS — E785 Hyperlipidemia, unspecified: Secondary | ICD-10-CM | POA: Diagnosis not present

## 2021-04-20 DIAGNOSIS — E1142 Type 2 diabetes mellitus with diabetic polyneuropathy: Secondary | ICD-10-CM | POA: Diagnosis not present

## 2021-04-27 ENCOUNTER — Encounter: Payer: Self-pay | Admitting: *Deleted

## 2021-04-27 ENCOUNTER — Other Ambulatory Visit: Payer: Self-pay | Admitting: *Deleted

## 2021-04-27 ENCOUNTER — Other Ambulatory Visit: Payer: Self-pay

## 2021-04-27 NOTE — Patient Outreach (Signed)
[facility]) Care Management  04/27/2021  MARIONA SCHOLES 1946/07/31 [id number]   [facility] outreach to complex care patient  Mrs Reis was referred to [facility] on 05/29/20 for EMMI general discharge (on APL) after her discharge from [place] ALERTforDay #4,Wednesday 05/28/20 1034frUnfilled prescriptions. This EMMI was addressed on 06/03/20 when was reach via phone [facility] CM [facility], Dr KRalene Muskratalsoreceived a pharmacy referral for Lantus and Spiriva from the patient's MD officein June 2021 Mrs SCanniffhad been sent patient assistance applications to her home address for Spiriva  Insurance:[employer] [facility] & [facility] visits Last admission on7/26/21 to 07/24/20 for acute blood loss anemia/ EGD - sent to ED for bleeding, generalized weakness, dark stools for a couple weeks Hgb 6.2 06/12/20-06/15/20 for GI bleeding secondary to angiodysplastic lesion 05/20/20 to 05/23/20 AAshleybleeding and anemia 05/15/20 to 05/16/20 CAD chest pain  Last successful outreach 03/30/21  Follow up Mrs SGuadianastates she is doing very good overall She reports no further medication issues  Last admission on7/26/21 to 07/24/20  Norovirus issues reported from 03/30/21 have resolved  She reports today the only noted concern since 03/30/21 has been eye, head issues She has spoken with her neurologist, Dr SManuella Ghazi She is to have a CT on June 18 2021 to evaluate these symptoms [facility] CM reviewed with her the worsening symptoms of visual and headache changes to report to Neurologist office even prior to June 2022 appointment She voiced understanding  Iron deficiency anemia, secondary to chronic blood loss. Asymptomatic  Hemoglobin and iron labs within normal limits  Diabetes (DM) type 2 /hypertension  Lowest is 65-69  03/24/21 HgA1c = 5.9  Wonderful improvements  Better since use of freestyle libre monitoring added and less  acute illnesses  She commends her work with Dr SGabriel Carinain maintaining her diabetes [facility] CM discussed with her the values for diabetes and pre diabetes She was commended for her home management  BP managed at home remains less than 140/90  Pancreatitis vs norovirus She reports her pcp feels her recent possible pancreatitis concerns may be related to norovirus and not a diagnosis of pancreatitis Discussed during the 03/31/21 office visit No further abdominal pain, flatus or frequent stools  Plans Patient agrees to care plan and follow up within the next 30-45 business days Pt encouraged to return a call to [facility] 24 hour nurse line prn Goals Addressed              This Visit's Progress     Patient Stated   .  COMPLETED: ([facility]) Anemia Prevented or Managed (pt-stated)   On track      Notes: 04/27/21 goal completed Resolved Iron deficiency anemia, secondary to chronic blood loss. Asymptomatic  Hemoglobin and iron labs within normal limits 03/30/21 continues infusions and MD follow ups Voiced Hgb good but iron/ferritin changes seen monthly  03/19/21 unsuccessful outreach 02/17/23 doing well assessed HTN, Depression husband sick 01/19/21 Hgb from 9.1 to 7.4 but still scheduled for endoscopy on 01/29/21 hx of 2 lower polyps cauterized with good results, hx of hysterectomy/no bleeding 01/05/21 low platelets, TIBC, iron, to start infusions 11/24/20 intravenous iron not needed since September 2021  Promoted use of iron-fortified foods      .  ([facility] Learn More About My Health (pt-stated)   On track        - tell my story and reason for my visit -  ask questions - speak up when I don't understand    Notes: 03/30/21 asked questions and shared her story related to CT, pancreatitis, norovirus, 03/19/21 unsuccessful outreach 02/19/23 doing well  01/19/21 learned about use of new freestyle from endocrinologist today with  use of her android phone for monitoring    .  COMPLETED: [facility]) Track and  Manage My Blood Pressure-Hypertension (pt-stated)   On track     Timeframe:  Short-Term Goal Priority:  Medium Start Date:                   01/19/21          Expected End Date:          04/27/21                - check blood pressure weekly - choose a place to take my blood pressure (home, clinic or office, retail store)    Notes: 04/27/21 goal met BP managed at home remains less than 140/90 03/30/21 continues with home BP monitoring range 97/43-150/70 03/19/21 unsuccessful outreach 02/19/23 doing well assessed HTN, Depression        Yer Olivencia L. [name], RN, BSN, [place] Coordinator Office number [phone] Main [facility] number [phone] Fax number [phone]

## 2021-05-02 DIAGNOSIS — E1165 Type 2 diabetes mellitus with hyperglycemia: Secondary | ICD-10-CM | POA: Diagnosis not present

## 2021-05-12 ENCOUNTER — Other Ambulatory Visit: Payer: Self-pay

## 2021-05-12 ENCOUNTER — Ambulatory Visit
Admission: RE | Admit: 2021-05-12 | Discharge: 2021-05-12 | Disposition: A | Discharge status: Home / Self Care | Payer: Medicare HMO | Source: Ambulatory Visit | Attending: Internal Medicine | Admitting: Internal Medicine

## 2021-05-12 DIAGNOSIS — Z1231 Encounter for screening mammogram for malignant neoplasm of breast: Secondary | ICD-10-CM | POA: Insufficient documentation

## 2021-06-03 DIAGNOSIS — E113393 Type 2 diabetes mellitus with moderate nonproliferative diabetic retinopathy without macular edema, bilateral: Secondary | ICD-10-CM | POA: Diagnosis not present

## 2021-06-03 DIAGNOSIS — H26491 Other secondary cataract, right eye: Secondary | ICD-10-CM | POA: Diagnosis not present

## 2021-06-09 DIAGNOSIS — E1165 Type 2 diabetes mellitus with hyperglycemia: Secondary | ICD-10-CM | POA: Diagnosis not present

## 2021-06-18 DIAGNOSIS — R519 Headache, unspecified: Secondary | ICD-10-CM | POA: Diagnosis not present

## 2021-06-18 DIAGNOSIS — E1142 Type 2 diabetes mellitus with diabetic polyneuropathy: Secondary | ICD-10-CM | POA: Diagnosis not present

## 2021-06-19 DIAGNOSIS — I2510 Atherosclerotic heart disease of native coronary artery without angina pectoris: Secondary | ICD-10-CM | POA: Diagnosis not present

## 2021-06-19 DIAGNOSIS — I10 Essential (primary) hypertension: Secondary | ICD-10-CM | POA: Diagnosis not present

## 2021-06-19 DIAGNOSIS — M25561 Pain in right knee: Secondary | ICD-10-CM | POA: Diagnosis not present

## 2021-06-19 DIAGNOSIS — E118 Type 2 diabetes mellitus with unspecified complications: Secondary | ICD-10-CM | POA: Diagnosis not present

## 2021-06-23 ENCOUNTER — Other Ambulatory Visit: Payer: Self-pay | Admitting: Neurology

## 2021-06-23 ENCOUNTER — Other Ambulatory Visit: Payer: Self-pay | Admitting: *Deleted

## 2021-06-23 DIAGNOSIS — R519 Headache, unspecified: Secondary | ICD-10-CM

## 2021-06-23 NOTE — Patient Outreach (Signed)
[facility]) Care Management  06/23/2021  Joyce Potter [birth year] [id number]   [facility] outreach to complex care patient   Joyce Potter was referred to [facility] on 05/29/20 for EMMI general discharge (on APL) after her discharge [facility] for Day # 4, Wednesday 05/28/20 1012 for Unfilled prescriptions. This EMMI was addressed on 06/03/20 when was reach via phone and resolved on 08/01/20 [facility] RN CM consulted with [facility] pharmacy staff, Dr Abbey Chatters who had also received a pharmacy referral for Lantus and Spiriva from the patient's MD office in June 2021  Joyce Potter completed patient assistance applications for Spiriva   Insurance: Bernadene Person   [facility] admissions [facility] ED visits Last admission on 07/21/20 to 07/24/20 for acute blood loss anemia/ EGD - sent to ED for bleeding, generalized weakness, dark stools for a couple weeks Hgb 6.2 06/12/20-06/15/20 for GI bleeding secondary to angiodysplastic lesion  05/20/20 to 05/23/20 [facility] GI bleeding and anemia 05/15/20 to 05/16/20 CAD chest pain     Follow up Joyce Potter states she is doing very good overall She reports no further medication issues  Last admission on 07/21/20 to 07/24/20     Reported 03/30/21 eye, head issues She has spoken with her neurologist, Dr Manuella Ghazi on 06/18/21 Arthritis in her spine per pt may be causes of her pain in her neck, eye, forehead Saw ophthalmologist, Porfilio, who identified per pt she has floaters but they will only need monitoring.  Her right eye was "fuzzy' and she was offered a laser procedure to assist for possible resolution. She reports she did have the laser procedure completed the same day but without successful as her right eye remains fuzzy   Iron deficiency anemia - managing well- lab values are normal Last IV Venofer was 01/21/21. Will have labs every 3 weeks to continue to monitor  Patient had an incoming call and had to disconnect She returned a call and confirm  she ha  Fall Saturday 06/20/21 tripped over her pajama bottoms hurt her knee only bruise  THN progression Discussed THN progression, goals completed and THN disease management/health coach services vs [facility] complex services Pt preference is to remain with [facility] RN CM complex services for another outreach [facility] RN CM discussed health coach services with the option to outreach to [facility] RN CM prn   Plans Patient agrees to care plan and follow up within the next 60-75 business days Pt encouraged to return a call to [facility] RN CM prn  Goals Addressed               This Visit's Progress     Patient Stated     [facility]) Learn More About My Health (pt-stated)   On track     Follow Up Date 08/24/21   - tell my story and reason for my visit - ask questions - speak up when I don't understand     Notes: 06/23/21 doing well She is able to update [facility] RN CM on the progression of her medical diagnosis with treatment plans. Questions are asked and answered Prefers progression with outreach in 2 months  03/30/21 asked questions and shared her story related to CT, pancreatitis, norovirus, 03/19/21 unsuccessful outreach 02/19/23 doing well  01/19/21 learned about use of new freestyle from endocrinologist today with  use of her android phone for monitoring         [place]. [name], RN, BSN, [place]  Coordinator Office number [phone] Main [facility] number [phone] Fax number [phone]

## 2021-06-26 DIAGNOSIS — E782 Mixed hyperlipidemia: Secondary | ICD-10-CM | POA: Diagnosis not present

## 2021-06-26 DIAGNOSIS — I10 Essential (primary) hypertension: Secondary | ICD-10-CM | POA: Diagnosis not present

## 2021-06-26 DIAGNOSIS — E118 Type 2 diabetes mellitus with unspecified complications: Secondary | ICD-10-CM | POA: Diagnosis not present

## 2021-06-30 ENCOUNTER — Other Ambulatory Visit: Payer: Self-pay

## 2021-06-30 ENCOUNTER — Ambulatory Visit
Admission: RE | Admit: 2021-06-30 | Discharge: 2021-06-30 | Disposition: A | Discharge status: Home / Self Care | Payer: Medicare HMO | Source: Ambulatory Visit | Attending: Neurology | Admitting: Neurology

## 2021-06-30 DIAGNOSIS — I6782 Cerebral ischemia: Secondary | ICD-10-CM | POA: Insufficient documentation

## 2021-06-30 DIAGNOSIS — I720 Aneurysm of carotid artery: Secondary | ICD-10-CM | POA: Insufficient documentation

## 2021-06-30 DIAGNOSIS — I668 Occlusion and stenosis of other cerebral arteries: Secondary | ICD-10-CM | POA: Insufficient documentation

## 2021-06-30 DIAGNOSIS — R519 Headache, unspecified: Secondary | ICD-10-CM

## 2021-06-30 DIAGNOSIS — I6622 Occlusion and stenosis of left posterior cerebral artery: Secondary | ICD-10-CM | POA: Insufficient documentation

## 2021-06-30 DIAGNOSIS — I651 Occlusion and stenosis of basilar artery: Secondary | ICD-10-CM | POA: Diagnosis not present

## 2021-06-30 DIAGNOSIS — I6603 Occlusion and stenosis of bilateral middle cerebral arteries: Secondary | ICD-10-CM | POA: Diagnosis not present

## 2021-07-09 DIAGNOSIS — E1165 Type 2 diabetes mellitus with hyperglycemia: Secondary | ICD-10-CM | POA: Diagnosis not present

## 2021-07-15 ENCOUNTER — Other Ambulatory Visit: Payer: Self-pay

## 2021-07-15 DIAGNOSIS — D509 Iron deficiency anemia, unspecified: Secondary | ICD-10-CM

## 2021-07-16 ENCOUNTER — Inpatient Hospital Stay: Payer: Medicare HMO | Attending: Oncology

## 2021-07-16 ENCOUNTER — Other Ambulatory Visit: Payer: Self-pay

## 2021-07-16 DIAGNOSIS — D500 Iron deficiency anemia secondary to blood loss (chronic): Secondary | ICD-10-CM

## 2021-07-16 DIAGNOSIS — D509 Iron deficiency anemia, unspecified: Secondary | ICD-10-CM | POA: Insufficient documentation

## 2021-07-16 LAB — CBC WITH DIFFERENTIAL/PLATELET
Abs Immature Granulocytes: 0.02 10*3/uL (ref 0.00–0.07)
Basophils Absolute: 0.1 10*3/uL (ref 0.0–0.1)
Basophils Relative: 1 %
Eosinophils Absolute: 0.5 10*3/uL (ref 0.0–0.5)
Eosinophils Relative: 9 %
HCT: 36.4 % (ref 36.0–46.0)
Hemoglobin: 11.7 g/dL — ABNORMAL LOW (ref 12.0–15.0)
Immature Granulocytes: 0 %
Lymphocytes Relative: 25 %
Lymphs Abs: 1.2 10*3/uL (ref 0.7–4.0)
MCH: 28.7 pg (ref 26.0–34.0)
MCHC: 32.1 g/dL (ref 30.0–36.0)
MCV: 89.2 fL (ref 80.0–100.0)
Monocytes Absolute: 0.6 10*3/uL (ref 0.1–1.0)
Monocytes Relative: 11 %
Neutro Abs: 2.6 10*3/uL (ref 1.7–7.7)
Neutrophils Relative %: 54 %
Platelets: 245 10*3/uL (ref 150–400)
RBC: 4.08 MIL/uL (ref 3.87–5.11)
RDW: 14.1 % (ref 11.5–15.5)
WBC: 5 10*3/uL (ref 4.0–10.5)
nRBC: 0 % (ref 0.0–0.2)

## 2021-07-16 LAB — IRON AND TIBC
Iron: 68 ug/dL (ref 28–170)
Saturation Ratios: 22 % (ref 10.4–31.8)
TIBC: 304 ug/dL (ref 250–450)
UIBC: 236 ug/dL

## 2021-07-16 LAB — FERRITIN: Ferritin: 38 ng/mL (ref 11–307)

## 2021-07-17 ENCOUNTER — Ambulatory Visit: Payer: Medicare HMO | Admitting: Oncology

## 2021-07-17 ENCOUNTER — Ambulatory Visit: Payer: Medicare HMO

## 2021-07-20 ENCOUNTER — Inpatient Hospital Stay: Payer: Medicare HMO | Admitting: Oncology

## 2021-07-20 ENCOUNTER — Inpatient Hospital Stay: Payer: Medicare HMO

## 2021-07-20 ENCOUNTER — Encounter: Payer: Self-pay | Admitting: Oncology

## 2021-07-20 ENCOUNTER — Other Ambulatory Visit: Payer: Self-pay

## 2021-07-20 VITALS — BP 148/75 | HR 84 | Temp 98.6°F | Resp 18 | Wt 199.2 lb

## 2021-07-20 VITALS — BP 144/78 | HR 79

## 2021-07-20 DIAGNOSIS — D500 Iron deficiency anemia secondary to blood loss (chronic): Secondary | ICD-10-CM

## 2021-07-20 DIAGNOSIS — D509 Iron deficiency anemia, unspecified: Secondary | ICD-10-CM

## 2021-07-20 MED ORDER — SODIUM CHLORIDE 0.9 % IV SOLN: 200.0000 mg | Freq: Once | INTRAVENOUS | Status: DC

## 2021-07-20 MED ORDER — SODIUM CHLORIDE 0.9 % IV SOLN
INTRAVENOUS | Status: DC | PRN
  Filled 2021-07-20: qty 250

## 2021-07-20 MED ORDER — IRON SUCROSE 20 MG/ML IV SOLN
200.0000 mg | Freq: Once | INTRAVENOUS | Status: AC
Start: 2021-07-20 — End: 2021-07-20
  Administered 2021-07-20: 200 mg via INTRAVENOUS
  Filled 2021-07-20: qty 10

## 2021-07-20 NOTE — Progress Notes (Signed)
Patient reports no energy.  Has recently been worked up by neurologist for chronic neck/head pain.

## 2021-07-20 NOTE — Progress Notes (Signed)
[place]  [phone] [phone]  ID: Joyce Potter OB: [birth year]  MR#: [id number]  GXQ#:[id number]  Patient Care Team: [name], MD as PCP - General (Internal Medicine) Lloyd Huger, MD as Consulting Physician (Hematology and Oncology) Barbaraann Faster, RN as [place] [employer] Fath, Javier Docker, MD as Consulting Physician (Cardiology) Solum, Betsey Holiday, MD as Physician Assistant (Endocrinology) Virgel Manifold, MD as Consulting Physician (Gastroenterology) Jacquelin Hawking, NP as Nurse Practitioner (Oncology) Birder Robson, MD as Referring Physician (Ophthalmology)  CHIEF COMPLAINT: Iron deficiency anemia, secondary to chronic blood loss.  INTERVAL HISTORY: Patient returns to clinic today for repeat laboratory work, further evaluation, and consideration of additional IV Venofer.  She was last seen in clinic on 04/09/21.   She last received IV iron on 01/09/21 and 01/21/21.   In the interim, she has done well.  Denies any interval hospitalizations.  Reports chronic fatigue.  Denies any bleeding.   REVIEW OF SYSTEMS:   Review of Systems  Constitutional:  Positive for malaise/fatigue.  Eyes:  Positive for blurred vision.  Respiratory:  Positive for shortness of breath.   Neurological:  Positive for weakness.  Psychiatric/Behavioral:  The patient is nervous/anxious.   As per HPI. Otherwise, a complete review of systems is negative.  PAST MEDICAL HISTORY: Past Medical History:  Diagnosis Date   Anemia    iron deficiency   Aortic stenosis    Basal cell carcinoma    CAD (coronary artery disease)    s/p Left circumflex stent in 2012   Carotid stenosis    Cataract    High cholesterol    HTN (hypertension)    Hyperlipidemia    IDDM (insulin dependent diabetes mellitus)    Multiple thyroid nodules    Benign   Peptic ulcer disease    Retinal artery occlusion    on left    PAST SURGICAL  HISTORY: Past Surgical History:  Procedure Laterality Date   ABDOMINAL HYSTERECTOMY     ARTERY BIOPSY Left 11/19/2015   Procedure: BIOPSY TEMPORAL ARTERY;  Surgeon: Algernon Huxley, MD;  Location: [facility] ORS;  Service: Vascular;  Laterality: Left;   BREAST BIOPSY Left 2002   core- neg   BREAST EXCISIONAL BIOPSY     ??? not visible scar on neither breast- pt believes it to be right breast    CARDIAC CATHETERIZATION N/A 08/08/2015   Procedure: Right and Left Heart Cath and Coronary Angiography;  Surgeon: Teodoro Spray, MD;  Location: [place] [facility];  Service: Cardiovascular;  Laterality: N/A;   CARDIAC CATHETERIZATION Bilateral 09/03/2016   Procedure: Right/Left Heart Cath and Coronary Angiography;  Surgeon: Teodoro Spray, MD;  Location: [place] [facility];  Service: Cardiovascular;  Laterality: Bilateral;   CATARACT EXTRACTION W/ INTRAOCULAR LENS  IMPLANT, BILATERAL     COLONOSCOPY     in 2013- internal hemorrhoids   COLONOSCOPY WITH PROPOFOL N/A 07/07/2018   Procedure: COLONOSCOPY WITH PROPOFOL;  Surgeon: Manya Silvas, MD;  Location[facility];  Service: Endoscopy;  Laterality: N/A;   COLONOSCOPY WITH PROPOFOL N/A 05/22/2020   Procedure: COLONOSCOPY WITH PROPOFOL;  Surgeon: Jonathon Bellows, MD;  Location: [facility] ENDOSCOPY;  Service: Gastroenterology;  Laterality: N/A;   CORONARY ANGIOGRAPHY N/A 09/14/2017   Procedure: CORONARY ANGIOGRAPHY;  Surgeon: Teodoro Spray, MD;  Location: [place] [facility];  Service: Cardiovascular;  Laterality: N/A;   CORONARY ANGIOPLASTY     CORONARY STENT INTERVENTION N/A 05/15/2020   Procedure:  coronary angiography;  Surgeon: Teodoro Spray, MD;  Location: [place] [facility];  Service: Cardiovascular;  Laterality: N/A;   CORONARY STENT INTERVENTION N/A 05/15/2020   Procedure: CORONARY STENT INTERVENTION;  Surgeon: Isaias Cowman, MD;  Location: [place] [facility];  Service: Cardiovascular;  Laterality: N/A;   CORONARY STENT PLACEMENT      ENDARTERECTOMY Left 10/01/2015   Procedure: ENDARTERECTOMY CAROTID;  Surgeon: Algernon Huxley, MD;  Location: [facility] ORS;  Service: Vascular;  Laterality: Left;   ENTEROSCOPY N/A 07/24/2020   Procedure: ENTEROSCOPY;  Surgeon: Virgel Manifold, MD;  Location: [facility] ENDOSCOPY;  Service: Endoscopy;  Laterality: N/A;   ESOPHAGOGASTRODUODENOSCOPY     ESOPHAGOGASTRODUODENOSCOPY (EGD) WITH PROPOFOL N/A 05/31/2016   Procedure: ESOPHAGOGASTRODUODENOSCOPY (EGD) WITH PROPOFOL;  Surgeon: Manya Silvas, MD;  Location: [facility] ENDOSCOPY;  Service: Endoscopy;  Laterality: N/A;   ESOPHAGOGASTRODUODENOSCOPY (EGD) WITH PROPOFOL N/A 07/07/2018   Procedure: ESOPHAGOGASTRODUODENOSCOPY (EGD) WITH PROPOFOL;  Surgeon: Manya Silvas, MD;  Location: [facility] ENDOSCOPY;  Service: Endoscopy;  Laterality: N/A;   ESOPHAGOGASTRODUODENOSCOPY (EGD) WITH PROPOFOL N/A 07/17/2018   Procedure: ESOPHAGOGASTRODUODENOSCOPY (EGD) WITH PROPOFOL;  Surgeon: Lucilla Lame, MD;  Location: [facility] ENDOSCOPY;  Service: Endoscopy;  Laterality: N/A;   ESOPHAGOGASTRODUODENOSCOPY (EGD) WITH PROPOFOL N/A 05/21/2020   Procedure: ESOPHAGOGASTRODUODENOSCOPY (EGD) WITH PROPOFOL;  Surgeon: Jonathon Bellows, MD;  Location: [facility] ENDOSCOPY;  Service: Gastroenterology;  Laterality: N/A;   ESOPHAGOGASTRODUODENOSCOPY (EGD) WITH PROPOFOL N/A 06/13/2020   Procedure: ESOPHAGOGASTRODUODENOSCOPY (EGD) WITH PROPOFOL;  Surgeon: Lucilla Lame, MD;  Location: [facility] ENDOSCOPY;  Service: Endoscopy;  Laterality: N/A;   EYE SURGERY     GIVENS CAPSULE STUDY N/A 04/17/2013   Procedure: GIVENS CAPSULE STUDY;  Surgeon: Arta Silence, MD;  Location: [facility] ENDOSCOPY;  Service: Endoscopy;  Laterality: N/A;  patient ate breakfast at [place] 06/11/2020   Procedure: GIVENS CAPSULE STUDY;  Surgeon: Virgel Manifold, MD;  Location: [facility] ENDOSCOPY;  Service: Endoscopy;  Laterality: N/A;   GIVENS CAPSULE STUDY N/A 01/29/2021   Procedure: GIVENS CAPSULE STUDY;  Surgeon: Virgel Manifold, MD;  Location: [facility] ENDOSCOPY;  Service: Endoscopy;  Laterality: N/A;   HEMORRHOID BANDING  07/27/2016   Dr Nicholes Stairs   PARTIAL HYSTERECTOMY     RIGHT AND LEFT HEART CATH Bilateral 09/14/2017   Procedure: RIGHT AND LEFT HEART CATH;  Surgeon: Teodoro Spray, MD;  Location: [place] [facility];  Service: Cardiovascular;  Laterality: Bilateral;   RIGHT/LEFT HEART CATH AND CORONARY ANGIOGRAPHY Right 05/15/2020   Procedure: Right/Left Heart cath;  Surgeon: Teodoro Spray, MD;  Location: [place] [facility];  Service: Cardiovascular;  Laterality: Right;   TRIGGER FINGER RELEASE      FAMILY HISTORY Family History  Problem Relation Age of Onset   Uterine cancer Mother    Breast cancer Mother 28   Seizures Father    Stroke Father    Diabetes Father    COPD Father    Colon cancer Neg Hx       ADVANCED DIRECTIVES:    HEALTH MAINTENANCE: Social History   Tobacco Use   Smoking status: Former    Types: Cigarettes    Quit date: 02/15/1985    Years since quitting: 36.4   Smokeless tobacco: Never   Tobacco comments:    quit about 31 years ago  Vaping Use   Vaping Use: Never used  Substance Use Topics   Alcohol use: No    Alcohol/week: 0.0 standard drinks   Drug use: No  Colonoscopy:  PAP:  Bone density: Osteoporosis- DEXA 06/2017 (on fosamax)  Lipid panel:  Allergies  Allergen Reactions   Sulfa Antibiotics Other (See Comments)    Altered Mental Status   Invokamet [Canagliflozin-Metformin Hcl] Other (See Comments)    Yeast Infection   Lovastatin Rash   Penicillins Rash    Has patient had a PCN reaction causing immediate rash, facial/tongue/throat swelling, SOB or lightheadedness with hypotension:Yes Has patient had a PCN reaction causing severe rash involving mucus membranes or skin necrosis:all over body Has patient had a PCN reaction that required hospitalization: No Has patient had a PCN reaction occurring within the last 10 years: No If all of the above  answers are "NO", then may proceed with Cephalosporin use.    Vicodin [Hydrocodone-Acetaminophen] Rash    Current Outpatient Medications  Medication Sig Dispense Refill   albuterol (VENTOLIN HFA) 108 (90 Base) MCG/ACT inhaler Inhale 2 puffs into the lungs every 4 (four) hours as needed for wheezing or shortness of breath.      alendronate (FOSAMAX) 70 MG tablet Take 70 mg by mouth once a week.      amitriptyline (ELAVIL) 25 MG tablet Take 25 mg by mouth at bedtime as needed for sleep.      aspirin EC 81 MG EC tablet Take 1 tablet (81 mg total) by mouth daily. Swallow whole. 30 tablet 11   atorvastatin (LIPITOR) 20 MG tablet Take 20 mg by mouth daily.     clopidogrel (PLAVIX) 75 MG tablet Take 1 tablet (75 mg total) by mouth daily with breakfast. 30 tablet 11   diclofenac Sodium (VOLTAREN) 1 % GEL Apply topically.     Dulaglutide 1.5 MG/0.5ML SOPN Inject 1.5 mg into the skin every Saturday.      DULoxetine (CYMBALTA) 20 MG capsule Take 20 mg by mouth 2 (two) times daily.     gabapentin (NEURONTIN) 300 MG capsule Take 300 mg by mouth 2 (two) times daily.      glucose monitoring kit (FREESTYLE) monitoring kit 1 each by Does not apply route as needed for other.     insulin NPH-regular Human (NOVOLIN 70/30) (70-30) 100 UNIT/ML injection Inject 30 Units into the skin 2 (two) times daily with a meal.      meloxicam (MOBIC) 15 MG tablet Take 15 mg by mouth daily.     metoCLOPramide (REGLAN) 5 MG tablet      metoprolol tartrate (LOPRESSOR) 100 MG tablet Take 100 mg by mouth daily.     ONETOUCH VERIO test strip SMARTSIG:Via Meter     saccharomyces boulardii (FLORASTOR) 250 MG capsule Take 250 mg by mouth 2 (two) times daily.      SPIRIVA HANDIHALER 18 MCG inhalation capsule Place 18 mcg into inhaler and inhale daily in the afternoon.      traMADol (ULTRAM) 50 MG tablet Take 50 mg by mouth every 6 (six) hours as needed for moderate pain.      atorvastatin (LIPITOR) 40 MG tablet  (Patient not taking:  No sig reported)     No current facility-administered medications for this visit.   Facility-Administered Medications Ordered in Other Visits  Medication Dose Route Frequency Provider Last Rate Last Admin   heparin lock flush 100 unit/mL  500 Units Intravenous Once Verlon Au, NP       sodium chloride flush (NS) 0.9 % injection 10 mL  10 mL Intravenous Once Verlon Au, NP        OBJECTIVE: Vitals:   07/20/21 1329  BP: (!) 148/75  Pulse: 84  Resp: 18  Temp: 98.6 F (37 C)     Body mass index is 35.29 kg/m.    ECOG FS:0 - Asymptomatic  Physical Exam Constitutional:      Appearance: Normal appearance. She is obese.  HENT:     Head: Normocephalic and atraumatic.  Eyes:     Pupils: Pupils are equal, round, and reactive to light.  Cardiovascular:     Rate and Rhythm: Normal rate and regular rhythm.     Heart sounds: Normal heart sounds. No murmur heard. Pulmonary:     Effort: Pulmonary effort is normal.     Breath sounds: Normal breath sounds. No wheezing.  Abdominal:     General: Bowel sounds are normal. There is no distension.     Palpations: Abdomen is soft.     Tenderness: There is no abdominal tenderness.  Musculoskeletal:        General: Normal range of motion.     Cervical back: Normal range of motion.  Skin:    General: Skin is warm and dry.     Findings: No rash.  Neurological:     Mental Status: She is alert and oriented to person, place, and time.  Psychiatric:        Judgment: Judgment normal.    LAB RESULTS:  Lab Results  Component Value Date   NA 141 07/24/2020   K 4.1 07/24/2020   CL 104 07/24/2020   CO2 26 07/24/2020   GLUCOSE 102 (H) 07/24/2020   BUN 8 07/24/2020   CREATININE 0.79 07/24/2020   CALCIUM 8.5 (L) 07/24/2020   PROT 7.0 07/21/2020   ALBUMIN 3.8 07/21/2020   AST 18 07/21/2020   ALT 14 07/21/2020   ALKPHOS 72 07/21/2020   BILITOT 0.6 07/21/2020   GFRNONAA >60 07/24/2020   GFRAA >60 07/24/2020    Lab Results   Component Value Date   WBC 5.0 07/16/2021   NEUTROABS 2.6 07/16/2021   HGB 11.7 (L) 07/16/2021   HCT 36.4 07/16/2021   MCV 89.2 07/16/2021   PLT 245 07/16/2021   Lab Results  Component Value Date   IRON 68 07/16/2021   TIBC 304 07/16/2021   IRONPCTSAT 22 07/16/2021    Lab Results  Component Value Date   FERRITIN 38 07/16/2021     STUDIES: MR ANGIO HEAD WO CONTRAST  Result Date: 07/01/2021 CLINICAL DATA:  Provided history: Patient reports left occipital headaches in left head and radiating to supraorbital region. Family history of aneurysms. EXAM: MRI HEAD WITHOUT CONTRAST MRA HEAD WITHOUT CONTRAST TECHNIQUE: Multiplanar, multi-echo pulse sequences of the brain and surrounding structures were acquired without intravenous contrast. Angiographic images of the Circle of Willis were acquired using MRA technique without intravenous contrast. COMPARISON:  Brain MRI 01/27/2019. CT angiogram head/neck 09/29/2015. FINDINGS: MRI HEAD FINDINGS Brain: Mild generalized cerebral and cerebellar atrophy. Mild multifocal T2/FLAIR hyperintensity within the cerebral white matter and pons, nonspecific but compatible with chronic small vessel ischemic disease. Punctate chronic microhemorrhage within the left parietooccipital lobes. There is no acute infarct. No evidence of an intracranial mass. No extra-axial fluid collection. No midline shift. Vascular: Expected proximal arterial flow voids. Skull and upper cervical spine: No focal suspicious marrow lesion. Incompletely assessed cervical spondylosis. Sinuses/Orbits: Visualized orbits show no acute finding. Mild bilateral sphenoid and maxillary sinus mucosal thickening. Additionally, small volume frothy secretions are present within the left sphenoid sinus. Tiny left maxillary sinus mucous retention cyst. MRA HEAD FINDING: Anterior circulation: A portion of  the pre cavernous intracranial internal carotid arteries are excluded from the field of view proximally.  The visualized intracranial internal carotid arteries are patent. The M1 middle cerebral arteries are patent. Mild atherosclerotic irregularity of both vessels. Atherosclerotic irregularity of the M2 and more distal middle cerebral artery branches bilaterally. Most notably, moderate stenoses are present within an inferior division mid M2 left MCA branch (series 102, image 3). The anterior cerebral arteries are patent. 1-2 mm posterior projecting vascular protrusion arising from the supraclinoid left ICA, near the origin of the left posterior communicating artery, likely reflecting an aneurysm (series 7, image 51) (series 107, image 153). 2 mm inferiorly projecting vascular protrusion arising from the cavernous right ICA compatible with small aneurysm (series 106, image 203). Posterior circulation: The intracranial vertebral arteries are largely excluded from the field of view. The basilar artery is patent. A moderate/severe stenosis within the mid basilar artery is new as compared to the CTA head of 09/29/2015. The posterior cerebral arteries are patent. Somewhat hypoplastic P1 segments bilaterally with large bilateral posterior communicating arteries. Severe stenoses within the P2 segments of the posterior cerebral arteries bilaterally. A severe stenosis is also present within the P3 left PCA. Anatomic variants: As described IMPRESSION: MRI brain: 1. No evidence of acute intracranial abnormality. 2. Mild chronic small vessel ischemic changes within the cerebral white matter and pons, not significantly changed from the brain MRI of 01/27/2019. 3. Mild generalized parenchymal atrophy, stable. 4. Paranasal sinus disease, as described. MRA head: 1. Portions of the proximal pre-cavernous internal carotid arteries are excluded from the field of view bilaterally. 2. Additionally, the intracranial vertebral arteries are largely excluded from the field of view. 3. 1-2 mm aneurysm arising from the supraclinoid left ICA, near  the left posterior communicating artery origin. This appears new from the prior CTA of 09/29/2015. 4. 2 mm aneurysm arising from the cavernous right ICA, also a new finding as compared to the prior CTA. 5. Progressive intracranial atherosclerotic disease with multifocal stenoses, as described and most notably as follows. 6. Moderate/severe stenosis within the mid basilar artery. 7. Sites of severe stenosis within the P2 posterior cerebral arteries bilaterally. Additional severe stenosis within the P3 left posterior cerebral artery. 8. Sites of moderate stenosis within an inferior division mid M2 left MCA branch. Electronically Signed   By: Kellie Simmering DO   On: 07/01/2021 09:15   MR BRAIN WO CONTRAST  Result Date: 07/01/2021 CLINICAL DATA:  Provided history: Patient reports left occipital headaches in left head and radiating to supraorbital region. Family history of aneurysms. EXAM: MRI HEAD WITHOUT CONTRAST MRA HEAD WITHOUT CONTRAST TECHNIQUE: Multiplanar, multi-echo pulse sequences of the brain and surrounding structures were acquired without intravenous contrast. Angiographic images of the Circle of Willis were acquired using MRA technique without intravenous contrast. COMPARISON:  Brain MRI 01/27/2019. CT angiogram head/neck 09/29/2015. FINDINGS: MRI HEAD FINDINGS Brain: Mild generalized cerebral and cerebellar atrophy. Mild multifocal T2/FLAIR hyperintensity within the cerebral white matter and pons, nonspecific but compatible with chronic small vessel ischemic disease. Punctate chronic microhemorrhage within the left parietooccipital lobes. There is no acute infarct. No evidence of an intracranial mass. No extra-axial fluid collection. No midline shift. Vascular: Expected proximal arterial flow voids. Skull and upper cervical spine: No focal suspicious marrow lesion. Incompletely assessed cervical spondylosis. Sinuses/Orbits: Visualized orbits show no acute finding. Mild bilateral sphenoid and maxillary  sinus mucosal thickening. Additionally, small volume frothy secretions are present within the left sphenoid sinus. Tiny left maxillary sinus mucous retention cyst.  MRA HEAD FINDING: Anterior circulation: A portion of the pre cavernous intracranial internal carotid arteries are excluded from the field of view proximally. The visualized intracranial internal carotid arteries are patent. The M1 middle cerebral arteries are patent. Mild atherosclerotic irregularity of both vessels. Atherosclerotic irregularity of the M2 and more distal middle cerebral artery branches bilaterally. Most notably, moderate stenoses are present within an inferior division mid M2 left MCA branch (series 102, image 3). The anterior cerebral arteries are patent. 1-2 mm posterior projecting vascular protrusion arising from the supraclinoid left ICA, near the origin of the left posterior communicating artery, likely reflecting an aneurysm (series 7, image 51) (series 107, image 153). 2 mm inferiorly projecting vascular protrusion arising from the cavernous right ICA compatible with small aneurysm (series 106, image 203). Posterior circulation: The intracranial vertebral arteries are largely excluded from the field of view. The basilar artery is patent. A moderate/severe stenosis within the mid basilar artery is new as compared to the CTA head of 09/29/2015. The posterior cerebral arteries are patent. Somewhat hypoplastic P1 segments bilaterally with large bilateral posterior communicating arteries. Severe stenoses within the P2 segments of the posterior cerebral arteries bilaterally. A severe stenosis is also present within the P3 left PCA. Anatomic variants: As described IMPRESSION: MRI brain: 1. No evidence of acute intracranial abnormality. 2. Mild chronic small vessel ischemic changes within the cerebral white matter and pons, not significantly changed from the brain MRI of 01/27/2019. 3. Mild generalized parenchymal atrophy, stable. 4.  Paranasal sinus disease, as described. MRA head: 1. Portions of the proximal pre-cavernous internal carotid arteries are excluded from the field of view bilaterally. 2. Additionally, the intracranial vertebral arteries are largely excluded from the field of view. 3. 1-2 mm aneurysm arising from the supraclinoid left ICA, near the left posterior communicating artery origin. This appears new from the prior CTA of 09/29/2015. 4. 2 mm aneurysm arising from the cavernous right ICA, also a new finding as compared to the prior CTA. 5. Progressive intracranial atherosclerotic disease with multifocal stenoses, as described and most notably as follows. 6. Moderate/severe stenosis within the mid basilar artery. 7. Sites of severe stenosis within the P2 posterior cerebral arteries bilaterally. Additional severe stenosis within the P3 left posterior cerebral artery. 8. Sites of moderate stenosis within an inferior division mid M2 left MCA branch. Electronically Signed   By: Kellie Simmering DO   On: 07/01/2021 09:15    ASSESSMENT: Iron deficiency anemia, secondary to chronic blood loss.  PLAN:    1. Iron deficiency anemia:  Unclear etiology-likely GI origin versus malabsorption. Had capsule endoscopy in February 2022 that revealed 1 nonbleeding angiectasia Bone marrow biopsy on February 15, 2017 did not reveal any significant pathology.   She last received IV Venofer on 01/21/2021. Labs from 07/16/2021 show slight decline in her hemoglobin 11.7.  MCV is 89.2.  Ferritin is 38. Given hemoglobin is trending down, would recommend 1 dose of IV Venofer.    Disposition: 1 dose IV venofer today. RTC in 3 months for labs, MD assessment and possible IV venofer.   I spent 20 minutes dedicated to the care of this patient (face-to-face and non-face-to-face) on the date of the encounter to include what is described in the assessment and plan.  Patient expressed understanding and was in agreement with this plan. She also  understands that She can call clinic at any time with any questions, concerns, or complaints.    Jacquelin Hawking, NP 07/20/21 1:35 PM

## 2021-07-20 NOTE — Addendum Note (Signed)
Addended by: Charlyn Minerva on: 07/20/2021 01:58 PM   Modules accepted: Orders

## 2021-08-05 DIAGNOSIS — K921 Melena: Secondary | ICD-10-CM | POA: Diagnosis not present

## 2021-08-05 DIAGNOSIS — I350 Nonrheumatic aortic (valve) stenosis: Secondary | ICD-10-CM | POA: Diagnosis not present

## 2021-08-05 DIAGNOSIS — R079 Chest pain, unspecified: Secondary | ICD-10-CM | POA: Diagnosis not present

## 2021-08-05 DIAGNOSIS — Z20822 Contact with and (suspected) exposure to covid-19: Secondary | ICD-10-CM | POA: Diagnosis not present

## 2021-08-05 DIAGNOSIS — Z8719 Personal history of other diseases of the digestive system: Secondary | ICD-10-CM | POA: Diagnosis not present

## 2021-08-05 DIAGNOSIS — I6529 Occlusion and stenosis of unspecified carotid artery: Secondary | ICD-10-CM | POA: Diagnosis not present

## 2021-08-05 DIAGNOSIS — I509 Heart failure, unspecified: Secondary | ICD-10-CM | POA: Diagnosis not present

## 2021-08-05 DIAGNOSIS — I110 Hypertensive heart disease with heart failure: Secondary | ICD-10-CM | POA: Diagnosis not present

## 2021-08-05 DIAGNOSIS — E669 Obesity, unspecified: Secondary | ICD-10-CM | POA: Diagnosis not present

## 2021-08-05 DIAGNOSIS — I2510 Atherosclerotic heart disease of native coronary artery without angina pectoris: Secondary | ICD-10-CM | POA: Diagnosis not present

## 2021-08-05 DIAGNOSIS — D649 Anemia, unspecified: Secondary | ICD-10-CM | POA: Diagnosis not present

## 2021-08-05 DIAGNOSIS — K922 Gastrointestinal hemorrhage, unspecified: Secondary | ICD-10-CM | POA: Diagnosis not present

## 2021-08-05 DIAGNOSIS — Z955 Presence of coronary angioplasty implant and graft: Secondary | ICD-10-CM | POA: Diagnosis not present

## 2021-08-05 DIAGNOSIS — E119 Type 2 diabetes mellitus without complications: Secondary | ICD-10-CM | POA: Diagnosis not present

## 2021-08-06 DIAGNOSIS — K921 Melena: Secondary | ICD-10-CM | POA: Diagnosis not present

## 2021-08-06 DIAGNOSIS — Z7982 Long term (current) use of aspirin: Secondary | ICD-10-CM | POA: Diagnosis not present

## 2021-08-06 DIAGNOSIS — I359 Nonrheumatic aortic valve disorder, unspecified: Secondary | ICD-10-CM | POA: Diagnosis not present

## 2021-08-06 DIAGNOSIS — Z794 Long term (current) use of insulin: Secondary | ICD-10-CM | POA: Diagnosis not present

## 2021-08-06 DIAGNOSIS — R10819 Abdominal tenderness, unspecified site: Secondary | ICD-10-CM | POA: Diagnosis not present

## 2021-08-06 DIAGNOSIS — Z955 Presence of coronary angioplasty implant and graft: Secondary | ICD-10-CM | POA: Diagnosis not present

## 2021-08-06 DIAGNOSIS — Z8711 Personal history of peptic ulcer disease: Secondary | ICD-10-CM | POA: Diagnosis not present

## 2021-08-06 DIAGNOSIS — D500 Iron deficiency anemia secondary to blood loss (chronic): Secondary | ICD-10-CM | POA: Diagnosis not present

## 2021-08-06 DIAGNOSIS — K922 Gastrointestinal hemorrhage, unspecified: Secondary | ICD-10-CM | POA: Diagnosis not present

## 2021-08-06 DIAGNOSIS — E119 Type 2 diabetes mellitus without complications: Secondary | ICD-10-CM | POA: Diagnosis not present

## 2021-08-06 DIAGNOSIS — D649 Anemia, unspecified: Secondary | ICD-10-CM | POA: Diagnosis not present

## 2021-08-06 DIAGNOSIS — I10 Essential (primary) hypertension: Secondary | ICD-10-CM | POA: Diagnosis not present

## 2021-08-06 DIAGNOSIS — I2510 Atherosclerotic heart disease of native coronary artery without angina pectoris: Secondary | ICD-10-CM | POA: Diagnosis not present

## 2021-08-06 DIAGNOSIS — Z7902 Long term (current) use of antithrombotics/antiplatelets: Secondary | ICD-10-CM | POA: Diagnosis not present

## 2021-08-06 DIAGNOSIS — Z79899 Other long term (current) drug therapy: Secondary | ICD-10-CM | POA: Diagnosis not present

## 2021-08-06 DIAGNOSIS — Z880 Allergy status to penicillin: Secondary | ICD-10-CM | POA: Diagnosis not present

## 2021-08-06 DIAGNOSIS — Z8719 Personal history of other diseases of the digestive system: Secondary | ICD-10-CM | POA: Diagnosis not present

## 2021-08-07 DIAGNOSIS — I2510 Atherosclerotic heart disease of native coronary artery without angina pectoris: Secondary | ICD-10-CM | POA: Diagnosis not present

## 2021-08-07 DIAGNOSIS — D649 Anemia, unspecified: Secondary | ICD-10-CM | POA: Diagnosis not present

## 2021-08-07 DIAGNOSIS — E119 Type 2 diabetes mellitus without complications: Secondary | ICD-10-CM | POA: Diagnosis not present

## 2021-08-07 DIAGNOSIS — Z955 Presence of coronary angioplasty implant and graft: Secondary | ICD-10-CM | POA: Diagnosis not present

## 2021-08-07 DIAGNOSIS — Z8719 Personal history of other diseases of the digestive system: Secondary | ICD-10-CM | POA: Diagnosis not present

## 2021-08-07 DIAGNOSIS — K921 Melena: Secondary | ICD-10-CM | POA: Diagnosis not present

## 2021-08-08 DIAGNOSIS — E1165 Type 2 diabetes mellitus with hyperglycemia: Secondary | ICD-10-CM | POA: Diagnosis not present

## 2021-08-10 DIAGNOSIS — I350 Nonrheumatic aortic (valve) stenosis: Secondary | ICD-10-CM | POA: Diagnosis not present

## 2021-08-10 DIAGNOSIS — K921 Melena: Secondary | ICD-10-CM | POA: Diagnosis not present

## 2021-08-10 DIAGNOSIS — Z6834 Body mass index (BMI) 34.0-34.9, adult: Secondary | ICD-10-CM | POA: Diagnosis not present

## 2021-08-10 DIAGNOSIS — E669 Obesity, unspecified: Secondary | ICD-10-CM | POA: Diagnosis not present

## 2021-08-10 DIAGNOSIS — I2510 Atherosclerotic heart disease of native coronary artery without angina pectoris: Secondary | ICD-10-CM | POA: Diagnosis not present

## 2021-08-10 DIAGNOSIS — Q2730 Arteriovenous malformation, site unspecified: Secondary | ICD-10-CM | POA: Diagnosis not present

## 2021-08-10 DIAGNOSIS — D649 Anemia, unspecified: Secondary | ICD-10-CM | POA: Diagnosis not present

## 2021-08-10 DIAGNOSIS — I10 Essential (primary) hypertension: Secondary | ICD-10-CM | POA: Diagnosis not present

## 2021-08-10 DIAGNOSIS — E119 Type 2 diabetes mellitus without complications: Secondary | ICD-10-CM | POA: Diagnosis not present

## 2021-08-10 DIAGNOSIS — D509 Iron deficiency anemia, unspecified: Secondary | ICD-10-CM | POA: Diagnosis not present

## 2021-08-11 ENCOUNTER — Telehealth: Payer: Self-pay

## 2021-08-11 ENCOUNTER — Telehealth: Payer: Self-pay | Admitting: Gastroenterology

## 2021-08-11 DIAGNOSIS — D500 Iron deficiency anemia secondary to blood loss (chronic): Secondary | ICD-10-CM

## 2021-08-11 NOTE — Telephone Encounter (Addendum)
MD would like patient scheduled for lab/NP with poss blood transfusion later this week or next week.  Please call pt to get scheduled.  Thanks!

## 2021-08-11 NOTE — Telephone Encounter (Signed)
Patient lvm and is having another gi bleed. Had to get a pint of blood as hemoglobin levels are down again. Please advise.

## 2021-08-11 NOTE — Telephone Encounter (Signed)
Pt is not currently having black stools, blood in stool or vomiting blood and is aware that she should go to ED if these Sx occur... appt for tomorrow at 2pm scheduled

## 2021-08-11 NOTE — Telephone Encounter (Signed)
Triage received a call from patient requesting an appointment for lab with possible blood transfusion due to recent hospitalization where she received a blood transfusion.

## 2021-08-12 ENCOUNTER — Other Ambulatory Visit: Payer: Self-pay

## 2021-08-12 ENCOUNTER — Inpatient Hospital Stay: Payer: Medicare HMO

## 2021-08-12 ENCOUNTER — Encounter: Payer: Self-pay | Admitting: Gastroenterology

## 2021-08-12 ENCOUNTER — Ambulatory Visit: Payer: Medicare HMO | Admitting: Gastroenterology

## 2021-08-12 VITALS — BP 161/78 | HR 98 | Temp 98.1°F | Ht 63.0 in | Wt 198.6 lb

## 2021-08-12 DIAGNOSIS — D509 Iron deficiency anemia, unspecified: Secondary | ICD-10-CM | POA: Diagnosis not present

## 2021-08-12 DIAGNOSIS — K921 Melena: Secondary | ICD-10-CM

## 2021-08-12 DIAGNOSIS — D500 Iron deficiency anemia secondary to blood loss (chronic): Secondary | ICD-10-CM

## 2021-08-12 LAB — CBC WITH DIFFERENTIAL/PLATELET
Abs Immature Granulocytes: 0.01 10*3/uL (ref 0.00–0.07)
Basophils Absolute: 0 10*3/uL (ref 0.0–0.1)
Basophils Relative: 1 %
Eosinophils Absolute: 0.3 10*3/uL (ref 0.0–0.5)
Eosinophils Relative: 9 %
HCT: 29.6 % — ABNORMAL LOW (ref 36.0–46.0)
Hemoglobin: 9.4 g/dL — ABNORMAL LOW (ref 12.0–15.0)
Immature Granulocytes: 0 %
Lymphocytes Relative: 30 %
Lymphs Abs: 1 10*3/uL (ref 0.7–4.0)
MCH: 29.6 pg (ref 26.0–34.0)
MCHC: 31.8 g/dL (ref 30.0–36.0)
MCV: 93.1 fL (ref 80.0–100.0)
Monocytes Absolute: 0.4 10*3/uL (ref 0.1–1.0)
Monocytes Relative: 12 %
Neutro Abs: 1.5 10*3/uL — ABNORMAL LOW (ref 1.7–7.7)
Neutrophils Relative %: 48 %
Platelets: 272 10*3/uL (ref 150–400)
RBC: 3.18 MIL/uL — ABNORMAL LOW (ref 3.87–5.11)
RDW: 13.8 % (ref 11.5–15.5)
WBC: 3.2 10*3/uL — ABNORMAL LOW (ref 4.0–10.5)
nRBC: 0 % (ref 0.0–0.2)

## 2021-08-12 LAB — SAMPLE TO BLOOD BANK

## 2021-08-12 LAB — IRON AND TIBC
Iron: 33 ug/dL (ref 28–170)
Saturation Ratios: 10 % — ABNORMAL LOW (ref 10.4–31.8)
TIBC: 333 ug/dL (ref 250–450)
UIBC: 300 ug/dL

## 2021-08-12 LAB — FERRITIN: Ferritin: 28 ng/mL (ref 11–307)

## 2021-08-12 NOTE — Telephone Encounter (Signed)
Hemoglobin result 9.4 today.  Patient was in town when I called to let her know that she will not require blood transfusion tomorrow.  Patient was able to by the office this afternoon for iron panel to be drawn and appt changed to poss Venofer tomorrow instead of blood transfusion.

## 2021-08-12 NOTE — Progress Notes (Signed)
[name], MD [address]  [place]  [place], [place] [address]  Main: [phone]  Fax: [phone]   Primary Care Physician: [name], MD   Chief complaint: Anemia, hospital follow-up  HPI: Joyce Potter is a 75 y.o. female with history of AVMs, iron deficiency anemia, recently admitted to [facility] for melena and anemia here for follow-up.  ED notes and Endoscopy report from 8/10 and 08/06/2021 reviewed.  Upper endoscopy on 08/06/2021 was normal and patient's melena resolved and patient was advised to follow-up with GI as an outpatient.  Patient has not had any further melena since hospital discharge and reports brown stool.  No abdominal pain, nausea or vomiting.    Patient has had multiple prior procedures including EGD, colonoscopy and capsule study for iron deficiency anemia and melena.  Small bowel capsule study in February 2022 showed lymphangiectasia.  Did not reach cecum.  No bleeding identified.  Push enteroscopy in July 2021 showed a nonbleeding AVM in the small bowel, that was cauterized  Upper endoscopy in June 2021 showed 3 nonbleeding angiectasia's in the jejunum that were cauterized.  This procedure was done due to active bleeding seen on small bowel capsule study the day prior to that.  Colonoscopy in May 2021 done for melena did not show any bleeding lesions or AVMs.  ROS: All ROS reviewed and negative except as per HPI   Past Medical History:  Diagnosis Date   Anemia    iron deficiency   Aortic stenosis    Basal cell carcinoma    CAD (coronary artery disease)    s/p Left circumflex stent in 2012   Carotid stenosis    Cataract    High cholesterol    HTN (hypertension)    Hyperlipidemia    IDDM (insulin dependent diabetes mellitus)    Multiple thyroid nodules    Benign   Peptic ulcer disease    Retinal artery occlusion    on left    Past Surgical History:  Procedure Laterality Date   ABDOMINAL HYSTERECTOMY     ARTERY  BIOPSY Left 11/19/2015   Procedure: BIOPSY TEMPORAL ARTERY;  Surgeon: Algernon Huxley, MD;  Location: [facility] ORS;  Service: Vascular;  Laterality: Left;   BREAST BIOPSY Left 2002   core- neg   BREAST EXCISIONAL BIOPSY     ??? not visible scar on neither breast- pt believes it to be right breast    CARDIAC CATHETERIZATION N/A 08/08/2015   Procedure: Right and Left Heart Cath and Coronary Angiography;  Surgeon: Teodoro Spray, MD;  Location: [place] [facility];  Service: Cardiovascular;  Laterality: N/A;   CARDIAC CATHETERIZATION Bilateral 09/03/2016   Procedure: Right/Left Heart Cath and Coronary Angiography;  Surgeon: Teodoro Spray, MD;  Location: [place] [facility];  Service: Cardiovascular;  Laterality: Bilateral;   CATARACT EXTRACTION W/ INTRAOCULAR LENS  IMPLANT, BILATERAL     COLONOSCOPY     in 2013- internal hemorrhoids   COLONOSCOPY WITH PROPOFOL N/A 07/07/2018   Procedure: COLONOSCOPY WITH PROPOFOL;  Surgeon: Manya Silvas, MD;  Location: [facility] ENDOSCOPY;  Service: Endoscopy;  Laterality: N/A;   COLONOSCOPY WITH PROPOFOL N/A 05/22/2020   Procedure: COLONOSCOPY WITH PROPOFOL;  Surgeon: Jonathon Bellows, MD;  Location: [facility] ENDOSCOPY;  Service: Gastroenterology;  Laterality: N/A;   CORONARY ANGIOGRAPHY N/A 09/14/2017   Procedure: CORONARY ANGIOGRAPHY;  Surgeon: Teodoro Spray, MD;  Location: [place] [facility];  Service: Cardiovascular;  Laterality: N/A;   CORONARY ANGIOPLASTY  CORONARY STENT INTERVENTION N/A 05/15/2020   Procedure: coronary angiography;  Surgeon: Teodoro Spray, MD;  Location: [place] [facility];  Service: Cardiovascular;  Laterality: N/A;   CORONARY STENT INTERVENTION N/A 05/15/2020   Procedure: CORONARY STENT INTERVENTION;  Surgeon: Isaias Cowman, MD;  Location: [place] [facility];  Service: Cardiovascular;  Laterality: N/A;   CORONARY STENT PLACEMENT     ENDARTERECTOMY Left 10/01/2015   Procedure: ENDARTERECTOMY CAROTID;  Surgeon: Algernon Huxley, MD;  Location:  [facility] ORS;  Service: Vascular;  Laterality: Left;   ENTEROSCOPY N/A 07/24/2020   Procedure: ENTEROSCOPY;  Surgeon: Virgel Manifold, MD;  Location: [facility] ENDOSCOPY;  Service: Endoscopy;  Laterality: N/A;   ESOPHAGOGASTRODUODENOSCOPY     ESOPHAGOGASTRODUODENOSCOPY (EGD) WITH PROPOFOL N/A 05/31/2016   Procedure: ESOPHAGOGASTRODUODENOSCOPY (EGD) WITH PROPOFOL;  Surgeon: Manya Silvas, MD;  Location: [facility] ENDOSCOPY;  Service: Endoscopy;  Laterality: N/A;   ESOPHAGOGASTRODUODENOSCOPY (EGD) WITH PROPOFOL N/A 07/07/2018   Procedure: ESOPHAGOGASTRODUODENOSCOPY (EGD) WITH PROPOFOL;  Surgeon: Manya Silvas, MD;  Location: [facility] ENDOSCOPY;  Service: Endoscopy;  Laterality: N/A;   ESOPHAGOGASTRODUODENOSCOPY (EGD) WITH PROPOFOL N/A 07/17/2018   Procedure: ESOPHAGOGASTRODUODENOSCOPY (EGD) WITH PROPOFOL;  Surgeon: Lucilla Lame, MD;  Location: [facility] ENDOSCOPY;  Service: Endoscopy;  Laterality: N/A;   ESOPHAGOGASTRODUODENOSCOPY (EGD) WITH PROPOFOL N/A 05/21/2020   Procedure: ESOPHAGOGASTRODUODENOSCOPY (EGD) WITH PROPOFOL;  Surgeon: Jonathon Bellows, MD;  Location: [facility] ENDOSCOPY;  Service: Gastroenterology;  Laterality: N/A;   ESOPHAGOGASTRODUODENOSCOPY (EGD) WITH PROPOFOL N/A 06/13/2020   Procedure: ESOPHAGOGASTRODUODENOSCOPY (EGD) WITH PROPOFOL;  Surgeon: Lucilla Lame, MD;  Location: [facility] ENDOSCOPY;  Service: Endoscopy;  Laterality: N/A;   EYE SURGERY     GIVENS CAPSULE STUDY N/A 04/17/2013   Procedure: GIVENS CAPSULE STUDY;  Surgeon: Arta Silence, MD;  Location: [facility] ENDOSCOPY;  Service: Endoscopy;  Laterality: N/A;  patient ate breakfast at [place] 06/11/2020   Procedure: GIVENS CAPSULE STUDY;  Surgeon: Virgel Manifold, MD;  Location: [facility] ENDOSCOPY;  Service: Endoscopy;  Laterality: N/A;   GIVENS CAPSULE STUDY N/A 01/29/2021   Procedure: GIVENS CAPSULE STUDY;  Surgeon: Virgel Manifold, MD;  Location: [facility] ENDOSCOPY;  Service: Endoscopy;  Laterality: N/A;   HEMORRHOID BANDING   07/27/2016   Dr Nicholes Stairs   PARTIAL HYSTERECTOMY     RIGHT AND LEFT HEART CATH Bilateral 09/14/2017   Procedure: RIGHT AND LEFT HEART CATH;  Surgeon: Teodoro Spray, MD;  Location: [place] [facility];  Service: Cardiovascular;  Laterality: Bilateral;   RIGHT/LEFT HEART CATH AND CORONARY ANGIOGRAPHY Right 05/15/2020   Procedure: Right/Left Heart cath;  Surgeon: Teodoro Spray, MD;  Location: [place] [facility];  Service: Cardiovascular;  Laterality: Right;   TRIGGER FINGER RELEASE      Prior to Admission medications   Medication Sig Start Date End Date Taking? Authorizing Provider  albuterol (VENTOLIN HFA) 108 (90 Base) MCG/ACT inhaler Inhale 2 puffs into the lungs every 4 (four) hours as needed for wheezing or shortness of breath.    Yes [provider]  alendronate (FOSAMAX) 70 MG tablet Take 70 mg by mouth once a week.    Yes [provider]  amitriptyline (ELAVIL) 25 MG tablet Take 25 mg by mouth at bedtime as needed for sleep.    Yes [provider]  aspirin EC 81 MG EC tablet Take 1 tablet (81 mg total) by mouth daily. Swallow whole. 06/16/20  Yes [name], MD  atorvastatin (LIPITOR) 20 MG tablet Take 20 mg by mouth  daily. 10/08/20  Yes [provider]  atorvastatin (LIPITOR) 40 MG tablet  03/31/21  Yes [provider]  clopidogrel (PLAVIX) 75 MG tablet Take 1 tablet (75 mg total) by mouth daily with breakfast. 05/16/20  Yes [name], [name], MD  diclofenac Sodium (VOLTAREN) 1 % GEL Apply topically. 05/14/19  Yes [provider]  Dulaglutide 1.5 MG/0.5ML SOPN Inject 1.5 mg into the skin every Saturday.  06/27/20  Yes [provider]  DULoxetine (CYMBALTA) 20 MG capsule Take 20 mg by mouth 2 (two) times daily. 07/29/20  Yes [provider]  gabapentin (NEURONTIN) 300 MG capsule Take 300 mg by mouth 2 (two) times daily.  04/30/19  Yes [provider]  glucose monitoring kit (FREESTYLE) monitoring kit 1  each by Does not apply route as needed for other.   Yes Solum, Betsey Holiday, MD  insulin NPH-regular Human (NOVOLIN 70/30) (70-30) 100 UNIT/ML injection Inject 30 Units into the skin 2 (two) times daily with a meal.    Yes [provider]  meloxicam (MOBIC) 15 MG tablet Take 15 mg by mouth daily. 06/19/21  Yes [provider]  metoCLOPramide (REGLAN) 5 MG tablet  03/31/21  Yes [provider]  metoprolol tartrate (LOPRESSOR) 100 MG tablet Take 100 mg by mouth daily.   Yes [provider]  ONETOUCH VERIO test strip SMARTSIG:Via Meter 09/06/20  Yes [provider]  saccharomyces boulardii (FLORASTOR) 250 MG capsule Take 250 mg by mouth 2 (two) times daily.    Yes [provider]  SPIRIVA HANDIHALER 18 MCG inhalation capsule Place 18 mcg into inhaler and inhale daily in the afternoon.  06/02/20  Yes [provider]  traMADol (ULTRAM) 50 MG tablet Take 50 mg by mouth every 6 (six) hours as needed for moderate pain.    Yes [provider]    Family History  Problem Relation Age of Onset   Uterine cancer Mother    Breast cancer Mother 12   Seizures Father    Stroke Father    Diabetes Father    COPD Father    Colon cancer Neg Hx      Social History   Tobacco Use   Smoking status: Former    Types: Cigarettes    Quit date: 02/15/1985    Years since quitting: 36.5   Smokeless tobacco: Never   Tobacco comments:    quit about 31 years ago  Vaping Use   Vaping Use: Never used  Substance Use Topics   Alcohol use: No    Alcohol/week: 0.0 standard drinks   Drug use: No    Allergies as of 08/12/2021 - Review Complete 08/12/2021  Allergen Reaction Noted   Sulfa antibiotics Other (See Comments) 04/13/2013   Invokamet [canagliflozin-metformin hcl] Other (See Comments) 08/08/2015   Lovastatin Rash 08/08/2015   Penicillins Rash 04/13/2013   Vicodin [hydrocodone-acetaminophen] Rash 04/13/2013    Physical  Examination:  Constitutional: General:   Alert,  Well-developed, well-nourished, pleasant and cooperative in NAD BP (!) 161/78   Pulse 98   Temp 98.1 F (36.7 C) (Oral)   Ht $R'5\' 3"'FP$  (1.6 m)   Wt 198 lb 9.6 oz (90.1 kg)   BMI 35.18 kg/m   Respiratory: Normal respiratory effort  Gastrointestinal:  Soft, non-tender and non-distended without masses, hepatosplenomegaly or hernias noted.  No guarding or rebound tenderness.     Cardiac: No clubbing or edema.  No cyanosis. Normal posterior tibial pedal pulses noted.  Psych:  Alert and cooperative. Normal mood  and affect.  Musculoskeletal:  Normal gait. Head normocephalic, atraumatic. Symmetrical without gross deformities. 5/5 Lower extremity strength bilaterally.  Skin: Warm. Intact without significant lesions or rashes. No jaundice.  Neck: Supple, trachea midline  Lymph: No cervical lymphadenopathy  Psych:  Alert and oriented x3, Alert and cooperative. Normal mood and affect.  Labs: CMP     Component Value Date/Time   NA 141 07/24/2020 0417   NA 143 05/04/2013 0614   K 4.1 07/24/2020 0417   K 3.8 05/04/2013 0614   CL 104 07/24/2020 0417   CL 110 (H) 05/04/2013 0614   CO2 26 07/24/2020 0417   CO2 27 05/04/2013 0614   GLUCOSE 102 (H) 07/24/2020 0417   GLUCOSE 130 (H) 05/04/2013 0614   BUN 8 07/24/2020 0417   BUN 12 05/04/2013 0614   CREATININE 0.79 07/24/2020 0417   CREATININE 0.79 08/27/2014 1445   CALCIUM 8.5 (L) 07/24/2020 0417   CALCIUM 8.2 (L) 05/04/2013 0614   PROT 7.0 07/21/2020 1011   PROT 7.6 08/27/2014 1445   ALBUMIN 3.8 07/21/2020 1011   ALBUMIN 3.5 08/27/2014 1445   AST 18 07/21/2020 1011   AST 27 08/27/2014 1445   ALT 14 07/21/2020 1011   ALT 27 08/27/2014 1445   ALKPHOS 72 07/21/2020 1011   ALKPHOS 101 08/27/2014 1445   BILITOT 0.6 07/21/2020 1011   BILITOT 0.4 08/27/2014 1445   GFRNONAA >60 07/24/2020 0417   GFRNONAA >60 08/27/2014 1445   GFRAA >60 07/24/2020 0417   GFRAA >60 08/27/2014 1445    Lab Results  Component Value Date   WBC 3.2 (L) 08/12/2021   HGB 9.4 (L) 08/12/2021   HCT 29.6 (L) 08/12/2021   MCV 93.1 08/12/2021   PLT 272 08/12/2021   August 2022:  Iron saturation 10%, serum iron 33  Ferritin 28  CMP showed normal liver enzymes with AST 20, ALT 13, alk phos 66, bilirubin 0.4  Imaging Studies:   Assessment and Plan:   Joyce Potter is a 75 y.o. y/o female here for follow-up of iron deficiency anemia, previously known AVMs in the small bowel that have been treated, with recent admission to [facility] for melena and anemia (notes reviewed), with upper endoscopy negative  Patient does not have any further melena since hospital discharge.  Her hemoglobin has improved over 1 g since hospital discharge.  Therefore, with no signs of active GI bleeding, patient does not need hospital admission at this time or urgent endoscopic procedures.  However, given her unexplained melena and recently worsening anemia requiring hospital admission, with previous history of AVMs, and previous small bowel capsule study did not reach the cecum, will repeat capsule study at this time to evaluate for AVMs or other lesions in her small bowel.  However, if patient has any signs of active GI bleeding which were discussed with her in detail, she was advised to go to the ER or call our answering service to get further advice and she verbalized understanding  She would like to have the capsule study done next week instead of this week as she is going out of town.  However, I have encouraged her to seek immediate care if she has any recurrence of GI bleeding while she is out of town.  Other labs reviewed, and do show low iron saturation as above.  CMP otherwise reassuring with normal liver enzymes and electrolytes  I have discussed alternative options, risks & benefits,  which include, but are not limited to, bleeding, infection, perforation, &  drug reaction.  The patient agrees with this plan  & written consent will be obtained.    In addition the above, the risks of small bowel capsule getting stuck in the GI tract were discussed. Need for surgery for removal of the capsule, if this occurs were discussed as well. This usually occurs if a stricture, mass or abnormality is present in the small bowel. Pt verbalized understanding and is agreeable to proceed with small bowel capsule study.    Dr [name]

## 2021-08-13 ENCOUNTER — Inpatient Hospital Stay: Payer: Medicare HMO | Attending: Oncology | Admitting: Oncology

## 2021-08-13 ENCOUNTER — Inpatient Hospital Stay: Payer: Medicare HMO

## 2021-08-13 ENCOUNTER — Encounter: Payer: Self-pay | Admitting: Oncology

## 2021-08-13 ENCOUNTER — Other Ambulatory Visit: Payer: Medicare HMO

## 2021-08-13 VITALS — BP 116/53 | HR 62 | Resp 16

## 2021-08-13 VITALS — BP 121/61 | HR 65 | Temp 97.7°F | Resp 18 | Wt 199.0 lb

## 2021-08-13 DIAGNOSIS — D509 Iron deficiency anemia, unspecified: Secondary | ICD-10-CM | POA: Diagnosis not present

## 2021-08-13 DIAGNOSIS — E1169 Type 2 diabetes mellitus with other specified complication: Secondary | ICD-10-CM | POA: Diagnosis not present

## 2021-08-13 DIAGNOSIS — E669 Obesity, unspecified: Secondary | ICD-10-CM | POA: Diagnosis not present

## 2021-08-13 DIAGNOSIS — K5521 Angiodysplasia of colon with hemorrhage: Secondary | ICD-10-CM

## 2021-08-13 DIAGNOSIS — D500 Iron deficiency anemia secondary to blood loss (chronic): Secondary | ICD-10-CM | POA: Diagnosis not present

## 2021-08-13 DIAGNOSIS — I10 Essential (primary) hypertension: Secondary | ICD-10-CM | POA: Diagnosis not present

## 2021-08-13 DIAGNOSIS — E1142 Type 2 diabetes mellitus with diabetic polyneuropathy: Secondary | ICD-10-CM | POA: Diagnosis not present

## 2021-08-13 DIAGNOSIS — M810 Age-related osteoporosis without current pathological fracture: Secondary | ICD-10-CM | POA: Diagnosis not present

## 2021-08-13 MED ORDER — IRON SUCROSE 20 MG/ML IV SOLN
200.0000 mg | Freq: Once | INTRAVENOUS | Status: AC
  Administered 2021-08-13: 200 mg via INTRAVENOUS
  Filled 2021-08-13: qty 10

## 2021-08-13 MED ORDER — SODIUM CHLORIDE 0.9 % IV SOLN
Freq: Once | INTRAVENOUS | Status: AC
  Filled 2021-08-13: qty 250

## 2021-08-13 MED ORDER — SODIUM CHLORIDE 0.9 % IV SOLN: 200.0000 mg | Freq: Once | INTRAVENOUS | Status: DC

## 2021-08-13 NOTE — Progress Notes (Signed)
Pt in for follow up and possible blood transfusion today.  States is very tired and weak all the time.  Has some shortness of breath at times.

## 2021-08-13 NOTE — Progress Notes (Signed)
[facility]  [phone] [phone]  ID: [name] OB: 1946/11/05  MR#: [id number]  HTD#:[id number]  Patient Care Team: [name], MD as PCP - General (Internal Medicine) Lloyd Huger, MD as Consulting Physician (Hematology and Oncology) Barbaraann Faster, RN as [place] Management Fath, Javier Docker, MD as Consulting Physician (Cardiology) Solum, Betsey Holiday, MD as Physician Assistant (Endocrinology) Virgel Manifold, MD as Consulting Physician (Gastroenterology) Jacquelin Hawking, NP as Nurse Practitioner (Oncology) Birder Robson, MD as Referring Physician (Ophthalmology)  CHIEF COMPLAINT: Iron deficiency anemia, secondary to chronic blood loss.  INTERVAL HISTORY: Joyce Potter is a 75 year old female with past medical history significant for hypertension, CAD, stroke, carotid stenosis, OSA and previous history of GI bleeding from AVMs that was treated.  She receives intermittent IV iron but overall has been stable for quite some time.  She last received IV Venofer on 07/20/2021 but prior to that it was January 2022.  Reports approximately 2 weeks ago developing dark melanic stools over the course of several days with increasing symptoms including fatigue, shortness of breath, abdominal pain and diarrhea and rapid heart rate.  She was seen at [facility] on 08/05/2021 for weakness and found to have a hemoglobin of 7.3 (11.7).  She was given a unit of packed red blood cells, IV Protonix and had EGD which was unremarkable.  Bleeding thought to be from AVM.  She contacted Dr. Delice Lesch and was seen yesterday.  Plan is for a capsule study on Monday.  In the interim, She is feeling better. Reports improvement of abdominal concerns. No additional diarrhea. She feels almost back to baseline.   REVIEW OF SYSTEMS:   Review of Systems  Constitutional: Negative.  Negative for chills, fever, malaise/fatigue and weight  loss.  HENT:  Negative for congestion, ear pain and tinnitus.   Eyes: Negative.  Negative for blurred vision and double vision.  Respiratory: Negative.  Negative for cough, sputum production and shortness of breath.   Cardiovascular: Negative.  Negative for chest pain, palpitations and leg swelling.  Gastrointestinal: Negative.  Negative for abdominal pain, constipation, diarrhea, nausea and vomiting.  Genitourinary:  Negative for dysuria, frequency and urgency.  Musculoskeletal:  Negative for back pain and falls.  Skin: Negative.  Negative for rash.  Neurological: Negative.  Negative for weakness and headaches.  Endo/Heme/Allergies: Negative.  Does not bruise/bleed easily.  Psychiatric/Behavioral:  Negative for depression. The patient is nervous/anxious. The patient does not have insomnia.   As per HPI. Otherwise, a complete review of systems is negative.  PAST MEDICAL HISTORY: Past Medical History:  Diagnosis Date   Anemia    iron deficiency   Aortic stenosis    Basal cell carcinoma    CAD (coronary artery disease)    s/p Left circumflex stent in 2012   Carotid stenosis    Cataract    High cholesterol    HTN (hypertension)    Hyperlipidemia    IDDM (insulin dependent diabetes mellitus)    Multiple thyroid nodules    Benign   Peptic ulcer disease    Retinal artery occlusion    on left    PAST SURGICAL HISTORY: Past Surgical History:  Procedure Laterality Date   ABDOMINAL HYSTERECTOMY     ARTERY BIOPSY Left 11/19/2015   Procedure: BIOPSY TEMPORAL ARTERY;  Surgeon: Algernon Huxley, MD;  Location: [facility] ORS;  Service: Vascular;  Laterality: Left;   BREAST BIOPSY Left 2002   core- neg  BREAST EXCISIONAL BIOPSY     ??? not visible scar on neither breast- pt believes it to be right breast    CARDIAC CATHETERIZATION N/A 08/08/2015   Procedure: Right and Left Heart Cath and Coronary Angiography;  Surgeon: Teodoro Spray, MD;  Location: [place] [facility];  Service:  Cardiovascular;  Laterality: N/A;   CARDIAC CATHETERIZATION Bilateral 09/03/2016   Procedure: Right/Left Heart Cath and Coronary Angiography;  Surgeon: Teodoro Spray, MD;  Location: [place] [facility];  Service: Cardiovascular;  Laterality: Bilateral;   CATARACT EXTRACTION W/ INTRAOCULAR LENS  IMPLANT, BILATERAL     COLONOSCOPY     in 2013- internal hemorrhoids   COLONOSCOPY WITH PROPOFOL N/A 07/07/2018   Procedure: COLONOSCOPY WITH PROPOFOL;  Surgeon: Manya Silvas, MD;  Location: [facility] ENDOSCOPY;  Service: Endoscopy;  Laterality: N/A;   COLONOSCOPY WITH PROPOFOL N/A 05/22/2020   Procedure: COLONOSCOPY WITH PROPOFOL;  Surgeon: Jonathon Bellows, MD;  Location: [facility] ENDOSCOPY;  Service: Gastroenterology;  Laterality: N/A;   CORONARY ANGIOGRAPHY N/A 09/14/2017   Procedure: CORONARY ANGIOGRAPHY;  Surgeon: Teodoro Spray, MD;  Location: [place] [facility];  Service: Cardiovascular;  Laterality: N/A;   CORONARY ANGIOPLASTY     CORONARY STENT INTERVENTION N/A 05/15/2020   Procedure: coronary angiography;  Surgeon: Teodoro Spray, MD;  Location: [place] [facility];  Service: Cardiovascular;  Laterality: N/A;   CORONARY STENT INTERVENTION N/A 05/15/2020   Procedure: CORONARY STENT INTERVENTION;  Surgeon: Isaias Cowman, MD;  Location: [place] [facility];  Service: Cardiovascular;  Laterality: N/A;   CORONARY STENT PLACEMENT     ENDARTERECTOMY Left 10/01/2015   Procedure: ENDARTERECTOMY CAROTID;  Surgeon: Algernon Huxley, MD;  Location: [facility] ORS;  Service: Vascular;  Laterality: Left;   ENTEROSCOPY N/A 07/24/2020   Procedure: ENTEROSCOPY;  Surgeon: Virgel Manifold, MD;  Location: [facility] ENDOSCOPY;  Service: Endoscopy;  Laterality: N/A;   ESOPHAGOGASTRODUODENOSCOPY     ESOPHAGOGASTRODUODENOSCOPY (EGD) WITH PROPOFOL N/A 05/31/2016   Procedure: ESOPHAGOGASTRODUODENOSCOPY (EGD) WITH PROPOFOL;  Surgeon: Manya Silvas, MD;  Location: [facility] ENDOSCOPY;  Service: Endoscopy;  Laterality: N/A;    ESOPHAGOGASTRODUODENOSCOPY (EGD) WITH PROPOFOL N/A 07/07/2018   Procedure: ESOPHAGOGASTRODUODENOSCOPY (EGD) WITH PROPOFOL;  Surgeon: Manya Silvas, MD;  Location: [facility] ENDOSCOPY;  Service: Endoscopy;  Laterality: N/A;   ESOPHAGOGASTRODUODENOSCOPY (EGD) WITH PROPOFOL N/A 07/17/2018   Procedure: ESOPHAGOGASTRODUODENOSCOPY (EGD) WITH PROPOFOL;  Surgeon: Lucilla Lame, MD;  Location[facility] ENDOSCOPY;  Service: Endoscopy;  Laterality: N/A;   ESOPHAGOGASTRODUODENOSCOPY (EGD) WITH PROPOFOL N/A 05/21/2020   Procedure: ESOPHAGOGASTRODUODENOSCOPY (EGD) WITH PROPOFOL;  Surgeon: Jonathon Bellows, MD;  Location: [facility] ENDOSCOPY;  Service: Gastroenterology;  Laterality: N/A;   ESOPHAGOGASTRODUODENOSCOPY (EGD) WITH PROPOFOL N/A 06/13/2020   Procedure: ESOPHAGOGASTRODUODENOSCOPY (EGD) WITH PROPOFOL;  Surgeon: Lucilla Lame, MD;  Location: [facility] ENDOSCOPY;  Service: Endoscopy;  Laterality: N/A;   EYE SURGERY     GIVENS CAPSULE STUDY N/A 04/17/2013   Procedure: GIVENS CAPSULE STUDY;  Surgeon: Arta Silence, MD;  Location: [facility] ENDOSCOPY;  Service: Endoscopy;  Laterality: N/A;  patient ate breakfast at [place] 06/11/2020   Procedure: GIVENS CAPSULE STUDY;  Surgeon: Virgel Manifold, MD;  Location: [facility] ENDOSCOPY;  Service: Endoscopy;  Laterality: N/A;   GIVENS CAPSULE STUDY N/A 01/29/2021   Procedure: GIVENS CAPSULE STUDY;  Surgeon: Virgel Manifold, MD;  Location: [facility] ENDOSCOPY;  Service: Endoscopy;  Laterality: N/A;   HEMORRHOID BANDING  07/27/2016   Dr Nicholes Stairs   PARTIAL HYSTERECTOMY  RIGHT AND LEFT HEART CATH Bilateral 09/14/2017   Procedure: RIGHT AND LEFT HEART CATH;  Surgeon: Teodoro Spray, MD;  Location: [place] [facility];  Service: Cardiovascular;  Laterality: Bilateral;   RIGHT/LEFT HEART CATH AND CORONARY ANGIOGRAPHY Right 05/15/2020   Procedure: Right/Left Heart cath;  Surgeon: Teodoro Spray, MD;  Location: [place] [facility];  Service: Cardiovascular;  Laterality: Right;    TRIGGER FINGER RELEASE      FAMILY HISTORY Family History  Problem Relation Age of Onset   Uterine cancer Mother    Breast cancer Mother 64   Seizures Father    Stroke Father    Diabetes Father    COPD Father    Colon cancer Neg Hx       ADVANCED DIRECTIVES:    HEALTH MAINTENANCE: Social History   Tobacco Use   Smoking status: Former    Types: Cigarettes    Quit date: 02/15/1985    Years since quitting: 36.5   Smokeless tobacco: Never   Tobacco comments:    quit about 31 years ago  Vaping Use   Vaping Use: Never used  Substance Use Topics   Alcohol use: No    Alcohol/week: 0.0 standard drinks   Drug use: No    Colonoscopy:  PAP:  Bone density: Osteoporosis- DEXA 06/2017 (on fosamax)  Lipid panel:  Allergies  Allergen Reactions   Sulfa Antibiotics Other (See Comments)    Altered Mental Status   Invokamet [Canagliflozin-Metformin Hcl] Other (See Comments)    Yeast Infection   Lovastatin Rash   Penicillins Rash    Has patient had a PCN reaction causing immediate rash, facial/tongue/throat swelling, SOB or lightheadedness with hypotension:Yes Has patient had a PCN reaction causing severe rash involving mucus membranes or skin necrosis:all over body Has patient had a PCN reaction that required hospitalization: No Has patient had a PCN reaction occurring within the last 10 years: No If all of the above answers are "NO", then may proceed with Cephalosporin use.    Vicodin [Hydrocodone-Acetaminophen] Rash    Current Outpatient Medications  Medication Sig Dispense Refill   albuterol (VENTOLIN HFA) 108 (90 Base) MCG/ACT inhaler Inhale 2 puffs into the lungs every 4 (four) hours as needed for wheezing or shortness of breath.      alendronate (FOSAMAX) 70 MG tablet Take 70 mg by mouth once a week.      amitriptyline (ELAVIL) 25 MG tablet Take 25 mg by mouth at bedtime as needed for sleep.      aspirin EC 81 MG EC tablet Take 1 tablet (81 mg total) by mouth daily.  Swallow whole. 30 tablet 11   atorvastatin (LIPITOR) 20 MG tablet Take 20 mg by mouth daily.     clopidogrel (PLAVIX) 75 MG tablet Take 1 tablet (75 mg total) by mouth daily with breakfast. 30 tablet 11   diclofenac Sodium (VOLTAREN) 1 % GEL Apply topically.     Dulaglutide 1.5 MG/0.5ML SOPN Inject 1.5 mg into the skin every Saturday.      DULoxetine (CYMBALTA) 20 MG capsule Take 20 mg by mouth 2 (two) times daily.     gabapentin (NEURONTIN) 300 MG capsule Take 300 mg by mouth 2 (two) times daily.      glucose monitoring kit (FREESTYLE) monitoring kit 1 each by Does not apply route as needed for other.     insulin NPH-regular Human (NOVOLIN 70/30) (70-30) 100 UNIT/ML injection Inject 30 Units into the skin 2 (two) times daily with a  meal.      meloxicam (MOBIC) 15 MG tablet Take 15 mg by mouth daily.     metoCLOPramide (REGLAN) 5 MG tablet      metoprolol tartrate (LOPRESSOR) 100 MG tablet Take 100 mg by mouth daily.     ONETOUCH VERIO test strip SMARTSIG:Via Meter     saccharomyces boulardii (FLORASTOR) 250 MG capsule Take 250 mg by mouth 2 (two) times daily.      SPIRIVA HANDIHALER 18 MCG inhalation capsule Place 18 mcg into inhaler and inhale daily in the afternoon.      traMADol (ULTRAM) 50 MG tablet Take 50 mg by mouth every 6 (six) hours as needed for moderate pain.      No current facility-administered medications for this visit.   Facility-Administered Medications Ordered in Other Visits  Medication Dose Route Frequency Provider Last Rate Last Admin   heparin lock flush 100 unit/mL  500 Units Intravenous Once Verlon Au, NP       sodium chloride flush (NS) 0.9 % injection 10 mL  10 mL Intravenous Once Verlon Au, NP        OBJECTIVE: Vitals:   08/13/21 0926  BP: 121/61  Pulse: 65  Resp: 18  Temp: 97.7 F (36.5 C)  SpO2: 98%     Body mass index is 35.25 kg/m.    ECOG FS:0 - Asymptomatic  Physical Exam Constitutional:      Appearance: Normal appearance.  HENT:      Head: Normocephalic and atraumatic.  Eyes:     Pupils: Pupils are equal, round, and reactive to light.  Cardiovascular:     Rate and Rhythm: Normal rate and regular rhythm.     Heart sounds: Normal heart sounds. No murmur heard. Pulmonary:     Effort: Pulmonary effort is normal.     Breath sounds: Normal breath sounds. No wheezing.  Abdominal:     General: Bowel sounds are normal. There is no distension.     Palpations: Abdomen is soft.     Tenderness: There is no abdominal tenderness.  Musculoskeletal:        General: Normal range of motion.     Cervical back: Normal range of motion.  Skin:    General: Skin is warm and dry.     Findings: No rash.  Neurological:     Mental Status: She is alert and oriented to person, place, and time.  Psychiatric:        Judgment: Judgment normal.    LAB RESULTS:  Lab Results  Component Value Date   NA 141 07/24/2020   K 4.1 07/24/2020   CL 104 07/24/2020   CO2 26 07/24/2020   GLUCOSE 102 (H) 07/24/2020   BUN 8 07/24/2020   CREATININE 0.79 07/24/2020   CALCIUM 8.5 (L) 07/24/2020   PROT 7.0 07/21/2020   ALBUMIN 3.8 07/21/2020   AST 18 07/21/2020   ALT 14 07/21/2020   ALKPHOS 72 07/21/2020   BILITOT 0.6 07/21/2020   GFRNONAA >60 07/24/2020   GFRAA >60 07/24/2020    Lab Results  Component Value Date   WBC 3.2 (L) 08/12/2021   NEUTROABS 1.5 (L) 08/12/2021   HGB 9.4 (L) 08/12/2021   HCT 29.6 (L) 08/12/2021   MCV 93.1 08/12/2021   PLT 272 08/12/2021   Lab Results  Component Value Date   IRON 33 08/12/2021   TIBC 333 08/12/2021   IRONPCTSAT 10 (L) 08/12/2021    Lab Results  Component Value Date   FERRITIN 28  08/12/2021     STUDIES: No results found.  ASSESSMENT: Iron deficiency anemia, secondary to chronic blood loss.  PLAN:    1. Iron deficiency anemia:  Unclear etiology.  She was recently seen at [facility] for melena, abdominal discomfort and fatigue, shortness of breath and tachycardia.  Work-up  included EGD which was negative.  Lab work showed a significant decrease in her hemoglobin from 11-7.3.  She was given 1 unit of packed red blood cells and started on IV Protonix.  Since she has started on Protonix, she denies any additional abdominal symptoms.  She is feeling better.  She is scheduled for a capsule study on Monday with Dr. Delice Lesch.  Labs from today show a hemoglobin of 9.4 along with a white blood cell count of 3.2 ANC of 1500.  Iron saturation level is 10% ferritin is 28.  Proceed with 3 doses of IV Venofer beginning today.  We will have her return to clinic monthly for lab work and possible IV iron and see a provider in 3 months.    I spent 25 minutes dedicated to the care of this patient (face-to-face and non-face-to-face) on the date of the encounter to include what is described in the assessment and plan.  Patient expressed understanding and was in agreement with this plan. She also understands that She can call clinic at any time with any questions, concerns, or complaints.    Jacquelin Hawking, NP 08/13/21 1:12 PM

## 2021-08-17 DIAGNOSIS — I10 Essential (primary) hypertension: Secondary | ICD-10-CM | POA: Diagnosis not present

## 2021-08-17 DIAGNOSIS — Q2730 Arteriovenous malformation, site unspecified: Secondary | ICD-10-CM | POA: Diagnosis not present

## 2021-08-17 DIAGNOSIS — E119 Type 2 diabetes mellitus without complications: Secondary | ICD-10-CM | POA: Diagnosis not present

## 2021-08-17 DIAGNOSIS — R809 Proteinuria, unspecified: Secondary | ICD-10-CM | POA: Diagnosis not present

## 2021-08-17 DIAGNOSIS — E1142 Type 2 diabetes mellitus with diabetic polyneuropathy: Secondary | ICD-10-CM | POA: Diagnosis not present

## 2021-08-17 DIAGNOSIS — E1169 Type 2 diabetes mellitus with other specified complication: Secondary | ICD-10-CM | POA: Diagnosis not present

## 2021-08-17 DIAGNOSIS — E1129 Type 2 diabetes mellitus with other diabetic kidney complication: Secondary | ICD-10-CM | POA: Diagnosis not present

## 2021-08-17 DIAGNOSIS — D649 Anemia, unspecified: Secondary | ICD-10-CM | POA: Diagnosis not present

## 2021-08-17 DIAGNOSIS — E669 Obesity, unspecified: Secondary | ICD-10-CM | POA: Diagnosis not present

## 2021-08-17 DIAGNOSIS — K921 Melena: Secondary | ICD-10-CM | POA: Diagnosis not present

## 2021-08-17 DIAGNOSIS — M810 Age-related osteoporosis without current pathological fracture: Secondary | ICD-10-CM | POA: Diagnosis not present

## 2021-08-17 DIAGNOSIS — I2510 Atherosclerotic heart disease of native coronary artery without angina pectoris: Secondary | ICD-10-CM | POA: Diagnosis not present

## 2021-08-17 DIAGNOSIS — D509 Iron deficiency anemia, unspecified: Secondary | ICD-10-CM | POA: Diagnosis not present

## 2021-08-17 DIAGNOSIS — E1159 Type 2 diabetes mellitus with other circulatory complications: Secondary | ICD-10-CM | POA: Diagnosis not present

## 2021-08-17 DIAGNOSIS — E785 Hyperlipidemia, unspecified: Secondary | ICD-10-CM | POA: Diagnosis not present

## 2021-08-17 DIAGNOSIS — Z6834 Body mass index (BMI) 34.0-34.9, adult: Secondary | ICD-10-CM | POA: Diagnosis not present

## 2021-08-17 DIAGNOSIS — I350 Nonrheumatic aortic (valve) stenosis: Secondary | ICD-10-CM | POA: Diagnosis not present

## 2021-08-18 DIAGNOSIS — E669 Obesity, unspecified: Secondary | ICD-10-CM | POA: Diagnosis not present

## 2021-08-18 DIAGNOSIS — D649 Anemia, unspecified: Secondary | ICD-10-CM | POA: Diagnosis not present

## 2021-08-18 DIAGNOSIS — I350 Nonrheumatic aortic (valve) stenosis: Secondary | ICD-10-CM | POA: Diagnosis not present

## 2021-08-18 DIAGNOSIS — I10 Essential (primary) hypertension: Secondary | ICD-10-CM | POA: Diagnosis not present

## 2021-08-18 DIAGNOSIS — I2510 Atherosclerotic heart disease of native coronary artery without angina pectoris: Secondary | ICD-10-CM | POA: Diagnosis not present

## 2021-08-18 DIAGNOSIS — Q2730 Arteriovenous malformation, site unspecified: Secondary | ICD-10-CM | POA: Diagnosis not present

## 2021-08-18 DIAGNOSIS — D509 Iron deficiency anemia, unspecified: Secondary | ICD-10-CM | POA: Diagnosis not present

## 2021-08-18 DIAGNOSIS — E119 Type 2 diabetes mellitus without complications: Secondary | ICD-10-CM | POA: Diagnosis not present

## 2021-08-18 DIAGNOSIS — K921 Melena: Secondary | ICD-10-CM | POA: Diagnosis not present

## 2021-08-18 DIAGNOSIS — Z6834 Body mass index (BMI) 34.0-34.9, adult: Secondary | ICD-10-CM | POA: Diagnosis not present

## 2021-08-19 ENCOUNTER — Other Ambulatory Visit: Payer: Self-pay

## 2021-08-19 ENCOUNTER — Inpatient Hospital Stay: Payer: Medicare HMO

## 2021-08-19 VITALS — BP 156/67 | HR 84 | Resp 18

## 2021-08-19 DIAGNOSIS — Z6834 Body mass index (BMI) 34.0-34.9, adult: Secondary | ICD-10-CM | POA: Diagnosis not present

## 2021-08-19 DIAGNOSIS — Q2730 Arteriovenous malformation, site unspecified: Secondary | ICD-10-CM | POA: Diagnosis not present

## 2021-08-19 DIAGNOSIS — D500 Iron deficiency anemia secondary to blood loss (chronic): Secondary | ICD-10-CM

## 2021-08-19 DIAGNOSIS — E119 Type 2 diabetes mellitus without complications: Secondary | ICD-10-CM | POA: Diagnosis not present

## 2021-08-19 DIAGNOSIS — E669 Obesity, unspecified: Secondary | ICD-10-CM | POA: Diagnosis not present

## 2021-08-19 DIAGNOSIS — D649 Anemia, unspecified: Secondary | ICD-10-CM | POA: Diagnosis not present

## 2021-08-19 DIAGNOSIS — I10 Essential (primary) hypertension: Secondary | ICD-10-CM | POA: Diagnosis not present

## 2021-08-19 DIAGNOSIS — I350 Nonrheumatic aortic (valve) stenosis: Secondary | ICD-10-CM | POA: Diagnosis not present

## 2021-08-19 DIAGNOSIS — K921 Melena: Secondary | ICD-10-CM | POA: Diagnosis not present

## 2021-08-19 DIAGNOSIS — D509 Iron deficiency anemia, unspecified: Secondary | ICD-10-CM | POA: Diagnosis not present

## 2021-08-19 DIAGNOSIS — I2510 Atherosclerotic heart disease of native coronary artery without angina pectoris: Secondary | ICD-10-CM | POA: Diagnosis not present

## 2021-08-19 LAB — CBC WITH DIFFERENTIAL/PLATELET
Abs Immature Granulocytes: 0.01 10*3/uL (ref 0.00–0.07)
Basophils Absolute: 0.1 10*3/uL (ref 0.0–0.1)
Basophils Relative: 1 %
Eosinophils Absolute: 0.3 10*3/uL (ref 0.0–0.5)
Eosinophils Relative: 7 %
HCT: 28.7 % — ABNORMAL LOW (ref 36.0–46.0)
Hemoglobin: 9.1 g/dL — ABNORMAL LOW (ref 12.0–15.0)
Immature Granulocytes: 0 %
Lymphocytes Relative: 27 %
Lymphs Abs: 1 10*3/uL (ref 0.7–4.0)
MCH: 29.8 pg (ref 26.0–34.0)
MCHC: 31.7 g/dL (ref 30.0–36.0)
MCV: 94.1 fL (ref 80.0–100.0)
Monocytes Absolute: 0.5 10*3/uL (ref 0.1–1.0)
Monocytes Relative: 13 %
Neutro Abs: 2 10*3/uL (ref 1.7–7.7)
Neutrophils Relative %: 52 %
Platelets: 255 10*3/uL (ref 150–400)
RBC: 3.05 MIL/uL — ABNORMAL LOW (ref 3.87–5.11)
RDW: 14.4 % (ref 11.5–15.5)
WBC: 3.9 10*3/uL — ABNORMAL LOW (ref 4.0–10.5)
nRBC: 0 % (ref 0.0–0.2)

## 2021-08-19 MED ORDER — IRON SUCROSE 20 MG/ML IV SOLN
200.0000 mg | Freq: Once | INTRAVENOUS | Status: AC
  Administered 2021-08-19: 200 mg via INTRAVENOUS
  Filled 2021-08-19: qty 10

## 2021-08-19 MED ORDER — SODIUM CHLORIDE 0.9 % IV SOLN
INTRAVENOUS | Status: DC
  Filled 2021-08-19: qty 250

## 2021-08-19 MED ORDER — SODIUM CHLORIDE 0.9 % IV SOLN: 200.0000 mg | Freq: Once | INTRAVENOUS | Status: DC

## 2021-08-19 NOTE — Progress Notes (Signed)
Pt states has had some sob and discomfort in chest..states she normally feels this way when her hgb is really low.  Per MD- check labs.  Hgb 9.1 today. Pt states she will call her cardiologist. MD aware and agrees.

## 2021-08-19 NOTE — Patient Instructions (Signed)
[facility] [place] [facility]  Discharge Instructions: Thank you for choosing [facility] to provide your oncology and hematology care.  If you have a lab appointment with the Cancer Center, please go directly to the Cancer Center and check in at the registration area.  Wear comfortable clothing and clothing appropriate for easy access to any Portacath or PICC line.   We strive to give you quality time with your provider. You may need to reschedule your appointment if you arrive late (15 or more minutes).  Arriving late affects you and other patients whose appointments are after yours.  Also, if you miss three or more appointments without notifying the office, you may be dismissed from the clinic at the provider's discretion.      For prescription refill requests, have your pharmacy contact our office and allow 72 hours for refills to be completed.    Today you received the following : Venofer   To help prevent nausea and vomiting after your treatment, we encourage you to take your nausea medication as directed.  BELOW ARE SYMPTOMS THAT SHOULD BE REPORTED IMMEDIATELY: . *FEVER GREATER THAN 100.4 F (38 C) OR HIGHER . *CHILLS OR SWEATING . *NAUSEA AND VOMITING THAT IS NOT CONTROLLED WITH YOUR NAUSEA MEDICATION . *UNUSUAL SHORTNESS OF BREATH . *UNUSUAL BRUISING OR BLEEDING . *URINARY PROBLEMS (pain or burning when urinating, or frequent urination) . *BOWEL PROBLEMS (unusual diarrhea, constipation, pain near the anus) . TENDERNESS IN MOUTH AND THROAT WITH OR WITHOUT PRESENCE OF ULCERS (sore throat, sores in mouth, or a toothache) . UNUSUAL RASH, SWELLING OR PAIN  . UNUSUAL VAGINAL DISCHARGE OR ITCHING   Items with * indicate a potential emergency and should be followed up as soon as possible or go to the Emergency Department if any problems should occur.  Please show the CHEMOTHERAPY ALERT CARD or IMMUNOTHERAPY ALERT CARD at check-in to the Emergency  Department and triage nurse.  Should you have questions after your visit or need to cancel or reschedule your appointment, please contact [facility] [place] [facility]  [phone] and follow the prompts.  Office hours are 8:00 a.m. to 4:30 p.m. Monday - Friday. Please note that voicemails left after 4:00 p.m. may not be returned until the following business day.  We are closed weekends and major holidays. You have access to a nurse at all times for urgent questions. Please call the main number to the clinic [phone] and follow the prompts.  For any non-urgent questions, you may also contact your provider using MyChart. We now offer e-Visits for anyone 18 and older to request care online for non-urgent symptoms. For details visit [email or web address].   Also download the MyChart app! Go to the app store, search "MyChart", open the app, select [facility], and log in with your MyChart username and password.  Due to Covid, a mask is required upon entering the hospital/clinic. If you do not have a mask, one will be given to you upon arrival. For doctor visits, patients may have 1 support person aged 18 or older with them. For treatment visits, patients cannot have anyone with them due to current Covid guidelines and our immunocompromised population.  

## 2021-08-21 ENCOUNTER — Inpatient Hospital Stay: Payer: Medicare HMO

## 2021-08-21 VITALS — BP 136/72 | HR 82 | Resp 18

## 2021-08-21 DIAGNOSIS — D509 Iron deficiency anemia, unspecified: Secondary | ICD-10-CM | POA: Diagnosis not present

## 2021-08-21 DIAGNOSIS — D500 Iron deficiency anemia secondary to blood loss (chronic): Secondary | ICD-10-CM

## 2021-08-21 MED ORDER — SODIUM CHLORIDE 0.9 % IV SOLN
INTRAVENOUS | Status: DC
  Filled 2021-08-21: qty 250

## 2021-08-21 MED ORDER — SODIUM CHLORIDE 0.9 % IV SOLN: 200.0000 mg | Freq: Once | INTRAVENOUS | Status: DC

## 2021-08-21 MED ORDER — IRON SUCROSE 20 MG/ML IV SOLN
200.0000 mg | Freq: Once | INTRAVENOUS | Status: AC
  Administered 2021-08-21: 200 mg via INTRAVENOUS
  Filled 2021-08-21: qty 10

## 2021-08-21 NOTE — Patient Instructions (Signed)
[facility] [place] [facility]  Discharge Instructions: Thank you for choosing [facility] to provide your oncology and hematology care.  If you have a lab appointment with the Cancer Center, please go directly to the Cancer Center and check in at the registration area.  Wear comfortable clothing and clothing appropriate for easy access to any Portacath or PICC line.   We strive to give you quality time with your provider. You may need to reschedule your appointment if you arrive late (15 or more minutes).  Arriving late affects you and other patients whose appointments are after yours.  Also, if you miss three or more appointments without notifying the office, you may be dismissed from the clinic at the provider's discretion.      For prescription refill requests, have your pharmacy contact our office and allow 72 hours for refills to be completed.    Today you received the following : Venofer   To help prevent nausea and vomiting after your treatment, we encourage you to take your nausea medication as directed.  BELOW ARE SYMPTOMS THAT SHOULD BE REPORTED IMMEDIATELY: . *FEVER GREATER THAN 100.4 F (38 C) OR HIGHER . *CHILLS OR SWEATING . *NAUSEA AND VOMITING THAT IS NOT CONTROLLED WITH YOUR NAUSEA MEDICATION . *UNUSUAL SHORTNESS OF BREATH . *UNUSUAL BRUISING OR BLEEDING . *URINARY PROBLEMS (pain or burning when urinating, or frequent urination) . *BOWEL PROBLEMS (unusual diarrhea, constipation, pain near the anus) . TENDERNESS IN MOUTH AND THROAT WITH OR WITHOUT PRESENCE OF ULCERS (sore throat, sores in mouth, or a toothache) . UNUSUAL RASH, SWELLING OR PAIN  . UNUSUAL VAGINAL DISCHARGE OR ITCHING   Items with * indicate a potential emergency and should be followed up as soon as possible or go to the Emergency Department if any problems should occur.  Please show the CHEMOTHERAPY ALERT CARD or IMMUNOTHERAPY ALERT CARD at check-in to the Emergency  Department and triage nurse.  Should you have questions after your visit or need to cancel or reschedule your appointment, please contact [facility] [place] [facility]  [phone] and follow the prompts.  Office hours are 8:00 a.m. to 4:30 p.m. Monday - Friday. Please note that voicemails left after 4:00 p.m. may not be returned until the following business day.  We are closed weekends and major holidays. You have access to a nurse at all times for urgent questions. Please call the main number to the clinic [phone] and follow the prompts.  For any non-urgent questions, you may also contact your provider using MyChart. We now offer e-Visits for anyone 18 and older to request care online for non-urgent symptoms. For details visit [email or web address].   Also download the MyChart app! Go to the app store, search "MyChart", open the app, select [facility], and log in with your MyChart username and password.  Due to Covid, a mask is required upon entering the hospital/clinic. If you do not have a mask, one will be given to you upon arrival. For doctor visits, patients may have 1 support person aged 18 or older with them. For treatment visits, patients cannot have anyone with them due to current Covid guidelines and our immunocompromised population.  

## 2021-08-24 ENCOUNTER — Ambulatory Visit: Payer: Medicare HMO | Admitting: *Deleted

## 2021-08-24 ENCOUNTER — Ambulatory Visit
Admission: RE | Admit: 2021-08-24 | Discharge: 2021-08-24 | Disposition: A | Discharge status: Home / Self Care | Payer: Medicare HMO | Attending: Gastroenterology | Admitting: Gastroenterology

## 2021-08-24 ENCOUNTER — Encounter
Admission: RE | Disposition: A | Discharge status: Home / Self Care | Payer: Self-pay | Source: Home / Self Care | Attending: Gastroenterology

## 2021-08-24 DIAGNOSIS — K319 Disease of stomach and duodenum, unspecified: Secondary | ICD-10-CM | POA: Insufficient documentation

## 2021-08-24 DIAGNOSIS — D509 Iron deficiency anemia, unspecified: Secondary | ICD-10-CM | POA: Diagnosis not present

## 2021-08-24 HISTORY — PX: GIVENS CAPSULE STUDY: SHX5432

## 2021-08-24 SURGERY — IMAGING PROCEDURE, GI TRACT, INTRALUMINAL, VIA CAPSULE

## 2021-08-25 ENCOUNTER — Encounter: Payer: Self-pay | Admitting: Gastroenterology

## 2021-08-25 DIAGNOSIS — D649 Anemia, unspecified: Secondary | ICD-10-CM | POA: Diagnosis not present

## 2021-08-25 DIAGNOSIS — Q2730 Arteriovenous malformation, site unspecified: Secondary | ICD-10-CM | POA: Diagnosis not present

## 2021-08-25 DIAGNOSIS — D509 Iron deficiency anemia, unspecified: Secondary | ICD-10-CM | POA: Diagnosis not present

## 2021-08-25 DIAGNOSIS — E119 Type 2 diabetes mellitus without complications: Secondary | ICD-10-CM | POA: Diagnosis not present

## 2021-08-25 DIAGNOSIS — K921 Melena: Secondary | ICD-10-CM | POA: Diagnosis not present

## 2021-08-25 DIAGNOSIS — E669 Obesity, unspecified: Secondary | ICD-10-CM | POA: Diagnosis not present

## 2021-08-25 DIAGNOSIS — I2510 Atherosclerotic heart disease of native coronary artery without angina pectoris: Secondary | ICD-10-CM | POA: Diagnosis not present

## 2021-08-25 DIAGNOSIS — I10 Essential (primary) hypertension: Secondary | ICD-10-CM | POA: Diagnosis not present

## 2021-08-25 DIAGNOSIS — I350 Nonrheumatic aortic (valve) stenosis: Secondary | ICD-10-CM | POA: Diagnosis not present

## 2021-08-25 DIAGNOSIS — Z6834 Body mass index (BMI) 34.0-34.9, adult: Secondary | ICD-10-CM | POA: Diagnosis not present

## 2021-08-27 ENCOUNTER — Other Ambulatory Visit: Payer: Self-pay

## 2021-08-27 ENCOUNTER — Other Ambulatory Visit: Payer: Self-pay | Admitting: *Deleted

## 2021-08-27 DIAGNOSIS — E669 Obesity, unspecified: Secondary | ICD-10-CM | POA: Diagnosis not present

## 2021-08-27 DIAGNOSIS — I10 Essential (primary) hypertension: Secondary | ICD-10-CM | POA: Diagnosis not present

## 2021-08-27 DIAGNOSIS — Z6834 Body mass index (BMI) 34.0-34.9, adult: Secondary | ICD-10-CM | POA: Diagnosis not present

## 2021-08-27 DIAGNOSIS — D649 Anemia, unspecified: Secondary | ICD-10-CM | POA: Diagnosis not present

## 2021-08-27 DIAGNOSIS — Q2730 Arteriovenous malformation, site unspecified: Secondary | ICD-10-CM | POA: Diagnosis not present

## 2021-08-27 DIAGNOSIS — I2510 Atherosclerotic heart disease of native coronary artery without angina pectoris: Secondary | ICD-10-CM | POA: Diagnosis not present

## 2021-08-27 DIAGNOSIS — E119 Type 2 diabetes mellitus without complications: Secondary | ICD-10-CM | POA: Diagnosis not present

## 2021-08-27 DIAGNOSIS — I350 Nonrheumatic aortic (valve) stenosis: Secondary | ICD-10-CM | POA: Diagnosis not present

## 2021-08-27 DIAGNOSIS — K921 Melena: Secondary | ICD-10-CM | POA: Diagnosis not present

## 2021-08-27 DIAGNOSIS — D509 Iron deficiency anemia, unspecified: Secondary | ICD-10-CM | POA: Diagnosis not present

## 2021-08-27 NOTE — Patient Outreach (Signed)
[place] [facility]) Care Management  08/27/2021  Joyce Potter 30-Sep-1946 [id number]  [facility] outreach to complex care patient   Joyce Potter was referred to [facility] on 05/29/20 for EMMI general discharge (on APL) after her discharge from [place] for Day # 4, Wednesday 05/28/20 1012 for Unfilled prescriptions. This EMMI was addressed on 06/03/20 when was reach via phone and resolved on 08/01/20 [facility] RN CM consulted with [facility] pharmacy staff, Dr Abbey Chatters who had also received a pharmacy referral for Lantus and Spiriva from the patient's MD office in June 2021  Joyce Potter completed patient assistance applications for Spiriva   Insurance: [place] medicare     Joyce Potter is able to verify HIPAA identifiers  Follow up assessment Anemia Recent experience of anemia symptoms- Blood count decreased, restarted blood infusions with iron (x 2) - has another in October 2022 Reports today her only symptom remaining is fatigue  Endoscopy found no bleeding source  Denies sob, chest pain Voiced her concern she had related to assistance from oncology after she had been encouraged to outreach for labs but delayed appointment provided Active listening and encouragement provided  Presently receiving PT and OT visits for balance/strength,  Denies recent falls but some recent dizziness at intervals  She noted on 08/26/21 an oxygen saturation rate of 90%  She is not on oxygen  RN CM reviewed medicare guideline with her for qualifying for home oxygen  RN CM also encouraged her to also monitor for hypotension episodes as she reported dizziness with standing. She reports her Losartan was discontinued recently and she is only on half her dose of metoprolol Taking only 100 mg  Plans Patient agrees to care plan and follow up within the next 14-21 business days  Goals Addressed               This Visit's Progress     Patient Stated     [facility]) Learn More About My  Health (pt-stated)   On track     Follow Up Date 09/10/21   - tell my story and reason for my visit - ask questions - speak up when I don't understand    Notes:  08/27/21 doing well Learning about her anemia, receiving home therapy for balance and strength. Learned and asked questions about hypotension and oxygen home use qualification 06/23/21 doing well She is able to update [facility] RN CM on the progression of her medical diagnosis with treatment plans. Questions are asked and answered Prefers progression with outreach in 2 months  03/30/21 asked questions and shared her story related to CT, pancreatitis, norovirus, 03/19/21 unsuccessful outreach 02/19/23 doing well  01/19/21 learned about use of new freestyle from endocrinologist today with  use of her android phone for monitoring        [place]. [name], RN, BSN, [place] Coordinator Office number [phone] Main [facility] number [phone] Fax number [phone]

## 2021-09-01 DIAGNOSIS — D509 Iron deficiency anemia, unspecified: Secondary | ICD-10-CM | POA: Diagnosis not present

## 2021-09-01 DIAGNOSIS — E669 Obesity, unspecified: Secondary | ICD-10-CM | POA: Diagnosis not present

## 2021-09-01 DIAGNOSIS — I2510 Atherosclerotic heart disease of native coronary artery without angina pectoris: Secondary | ICD-10-CM | POA: Diagnosis not present

## 2021-09-01 DIAGNOSIS — D649 Anemia, unspecified: Secondary | ICD-10-CM | POA: Diagnosis not present

## 2021-09-01 DIAGNOSIS — I350 Nonrheumatic aortic (valve) stenosis: Secondary | ICD-10-CM | POA: Diagnosis not present

## 2021-09-01 DIAGNOSIS — I10 Essential (primary) hypertension: Secondary | ICD-10-CM | POA: Diagnosis not present

## 2021-09-01 DIAGNOSIS — Q2730 Arteriovenous malformation, site unspecified: Secondary | ICD-10-CM | POA: Diagnosis not present

## 2021-09-01 DIAGNOSIS — Z6834 Body mass index (BMI) 34.0-34.9, adult: Secondary | ICD-10-CM | POA: Diagnosis not present

## 2021-09-01 DIAGNOSIS — K921 Melena: Secondary | ICD-10-CM | POA: Diagnosis not present

## 2021-09-01 DIAGNOSIS — E119 Type 2 diabetes mellitus without complications: Secondary | ICD-10-CM | POA: Diagnosis not present

## 2021-09-04 DIAGNOSIS — K921 Melena: Secondary | ICD-10-CM | POA: Diagnosis not present

## 2021-09-04 DIAGNOSIS — I10 Essential (primary) hypertension: Secondary | ICD-10-CM | POA: Diagnosis not present

## 2021-09-04 DIAGNOSIS — E669 Obesity, unspecified: Secondary | ICD-10-CM | POA: Diagnosis not present

## 2021-09-04 DIAGNOSIS — I2510 Atherosclerotic heart disease of native coronary artery without angina pectoris: Secondary | ICD-10-CM | POA: Diagnosis not present

## 2021-09-04 DIAGNOSIS — D649 Anemia, unspecified: Secondary | ICD-10-CM | POA: Diagnosis not present

## 2021-09-04 DIAGNOSIS — D509 Iron deficiency anemia, unspecified: Secondary | ICD-10-CM | POA: Diagnosis not present

## 2021-09-04 DIAGNOSIS — Q2730 Arteriovenous malformation, site unspecified: Secondary | ICD-10-CM | POA: Diagnosis not present

## 2021-09-04 DIAGNOSIS — I350 Nonrheumatic aortic (valve) stenosis: Secondary | ICD-10-CM | POA: Diagnosis not present

## 2021-09-04 DIAGNOSIS — E119 Type 2 diabetes mellitus without complications: Secondary | ICD-10-CM | POA: Diagnosis not present

## 2021-09-04 DIAGNOSIS — Z6834 Body mass index (BMI) 34.0-34.9, adult: Secondary | ICD-10-CM | POA: Diagnosis not present

## 2021-09-08 DIAGNOSIS — E119 Type 2 diabetes mellitus without complications: Secondary | ICD-10-CM | POA: Diagnosis not present

## 2021-09-08 DIAGNOSIS — Z6834 Body mass index (BMI) 34.0-34.9, adult: Secondary | ICD-10-CM | POA: Diagnosis not present

## 2021-09-08 DIAGNOSIS — E669 Obesity, unspecified: Secondary | ICD-10-CM | POA: Diagnosis not present

## 2021-09-08 DIAGNOSIS — I2510 Atherosclerotic heart disease of native coronary artery without angina pectoris: Secondary | ICD-10-CM | POA: Diagnosis not present

## 2021-09-08 DIAGNOSIS — I10 Essential (primary) hypertension: Secondary | ICD-10-CM | POA: Diagnosis not present

## 2021-09-08 DIAGNOSIS — K921 Melena: Secondary | ICD-10-CM | POA: Diagnosis not present

## 2021-09-08 DIAGNOSIS — D649 Anemia, unspecified: Secondary | ICD-10-CM | POA: Diagnosis not present

## 2021-09-08 DIAGNOSIS — I350 Nonrheumatic aortic (valve) stenosis: Secondary | ICD-10-CM | POA: Diagnosis not present

## 2021-09-08 DIAGNOSIS — D509 Iron deficiency anemia, unspecified: Secondary | ICD-10-CM | POA: Diagnosis not present

## 2021-09-08 DIAGNOSIS — Q2730 Arteriovenous malformation, site unspecified: Secondary | ICD-10-CM | POA: Diagnosis not present

## 2021-09-10 ENCOUNTER — Other Ambulatory Visit: Payer: Self-pay | Admitting: *Deleted

## 2021-09-10 ENCOUNTER — Other Ambulatory Visit: Payer: Self-pay

## 2021-09-10 ENCOUNTER — Encounter: Payer: Self-pay | Admitting: *Deleted

## 2021-09-10 NOTE — Patient Outreach (Signed)
[place] [facility]) Care Management  09/10/2021  Joyce Potter Jan 29, 1946 [id number]   [facility] outreach to complex care patient   Mrs Brando was referred to [facility] on 05/29/20 for EMMI general discharge (on APL) after her discharge from [place] for Day # 4, Wednesday 05/28/20 1012 for Unfilled prescriptions. This EMMI was addressed on 06/03/20 when was reach via phone and resolved on 08/01/20 [facility] RN CM consulted with [facility] pharmacy staff, Dr Abbey Chatters who had also received a pharmacy referral for Lantus and Spiriva from the patient's MD office in June 2021  Mrs Kuznicki completed patient assistance applications for Spiriva She continues to be folowed for [facility] complex care services    Insurance: [place] medicare      Mrs Fiegl is able to verify HIPAA identifiers   Follow up assessment Mobility "Balance still off" continue with therapy sessions (marcus denny) Discussed fear with falling as she reports "I believe it is in my mind more than anything" Discussed focus  Anemia still has some fatigue  home machine and monitoring discussed  Have not seen more blood Her general signs and symptoms (s/s) start with diarrhea, tarry bloody stools  Still on aspirin and plavix. She is aware she will need to be seen by cardiology to to manage  Discussed the importance of aspirin and plavix for stroke prevention Checks stools daily for blood & color changes  Had cravings for crushed ice years ago but not now Interested in possibly obtaining a device to monitor for changes in anemia at home She and RN CM reviewed some identified devices ($[id number])via internets and discussed that any anemia device may be an out of pocket expense, not covered by insurance  vaccination Got flu shot about a week ago- joint hurt more  Diabetes managing well at home  But pending [employer] supplies from [employer]   RN CM intervention sent EMMIs to list e-mail address for anemia caused by  low iron, good food sources of iron, bloody stools, standing balance exercises, beginner   Patient Active Problem List   Diagnosis Date Noted   Pain in limb 02/20/2021   AVM (arteriovenous malformation) 01/09/2021   Aortic atherosclerosis ([place]) 08/12/2020   Anemia 07/22/2020   Acute on chronic anemia 07/21/2020   History of blood transfusion 06/27/2020   OSA (obstructive sleep apnea) 06/27/2020   GI bleeding 06/12/2020   Diabetes mellitus without complication ([place]) [id number]   Anemia due to blood loss 05/20/2020   Blood loss anemia    Chronic fatigue 03/21/2020   Type 2 diabetes mellitus, with long-term current use of insulin ([place]) 03/21/2020   Obesity, Class II, BMI 35-39.9 03/21/2020   Snoring 03/21/2020   Background diabetic retinopathy associated with type 2 diabetes mellitus ([place]) 06/06/2019   Uncontrolled type 2 diabetes mellitus with hyperglycemia ([place]) 04/30/2019   Osteoporosis, post-menopausal 04/30/2019   Multiple rib fractures 10/11/2018   GIB (gastrointestinal bleeding) 07/17/2018   Melena    Symptomatic anemia    PVD (peripheral vascular disease) ([place]) 02/11/2017   Iron deficiency anemia due to chronic blood loss 07/13/2016   Multinodular goiter 01/25/2016   Central retinal artery occlusion 10/01/2015   Carotid stenosis 10/01/2015   Stroke ([place]) 09/28/2015   Aortic stenosis 11/29/2014   PUD (peptic ulcer disease) 07/17/2014   HLD (hyperlipidemia) 05/07/2013   GI bleed from an occult source with recurrent chronic blood loss anemia 04/14/2013   CAD (coronary artery disease) 04/14/2013   IDDM (insulin dependent  diabetes mellitus) 04/14/2013   HTN (hypertension) 04/14/2013   Chest pain 04/14/2013    Plans Patient agrees to care plan and follow up within the next 14-21 business days   Rashanda Magloire L. [name], RN, BSN, [place] Coordinator Office number [phone] Main [facility] number [phone] Fax number [phone]

## 2021-09-11 DIAGNOSIS — E669 Obesity, unspecified: Secondary | ICD-10-CM | POA: Diagnosis not present

## 2021-09-11 DIAGNOSIS — I2510 Atherosclerotic heart disease of native coronary artery without angina pectoris: Secondary | ICD-10-CM | POA: Diagnosis not present

## 2021-09-11 DIAGNOSIS — K921 Melena: Secondary | ICD-10-CM | POA: Diagnosis not present

## 2021-09-11 DIAGNOSIS — I350 Nonrheumatic aortic (valve) stenosis: Secondary | ICD-10-CM | POA: Diagnosis not present

## 2021-09-11 DIAGNOSIS — Q2730 Arteriovenous malformation, site unspecified: Secondary | ICD-10-CM | POA: Diagnosis not present

## 2021-09-11 DIAGNOSIS — E119 Type 2 diabetes mellitus without complications: Secondary | ICD-10-CM | POA: Diagnosis not present

## 2021-09-11 DIAGNOSIS — Z6834 Body mass index (BMI) 34.0-34.9, adult: Secondary | ICD-10-CM | POA: Diagnosis not present

## 2021-09-11 DIAGNOSIS — D649 Anemia, unspecified: Secondary | ICD-10-CM | POA: Diagnosis not present

## 2021-09-11 DIAGNOSIS — I10 Essential (primary) hypertension: Secondary | ICD-10-CM | POA: Diagnosis not present

## 2021-09-11 DIAGNOSIS — D509 Iron deficiency anemia, unspecified: Secondary | ICD-10-CM | POA: Diagnosis not present

## 2021-09-14 ENCOUNTER — Other Ambulatory Visit: Payer: Self-pay | Admitting: *Deleted

## 2021-09-14 NOTE — Patient Outreach (Signed)
[place] [facility]) Care Management  09/14/2021  Joyce Potter December 21, 1946 [id number]   [place] coordination- follow up DME outreach  Follow up outreach to patient to update her on [employer] supply store charge and need to special orders a device for hemoglobin monitoring She did outreach to [employer] and her diabetic supplies are scheduled to arrive on 09/15/21   Plan Patient agrees to care plan and follow up witihin the next 30 business days   Willett Lefeber L. [name], RN, BSN, [place] Coordinator Office number [phone] Mobile number [phone]  Main [facility] number [phone] Fax number [phone]

## 2021-09-15 DIAGNOSIS — D649 Anemia, unspecified: Secondary | ICD-10-CM | POA: Diagnosis not present

## 2021-09-15 DIAGNOSIS — K921 Melena: Secondary | ICD-10-CM | POA: Diagnosis not present

## 2021-09-15 DIAGNOSIS — Z6834 Body mass index (BMI) 34.0-34.9, adult: Secondary | ICD-10-CM | POA: Diagnosis not present

## 2021-09-15 DIAGNOSIS — I2510 Atherosclerotic heart disease of native coronary artery without angina pectoris: Secondary | ICD-10-CM | POA: Diagnosis not present

## 2021-09-15 DIAGNOSIS — Q2730 Arteriovenous malformation, site unspecified: Secondary | ICD-10-CM | POA: Diagnosis not present

## 2021-09-15 DIAGNOSIS — E119 Type 2 diabetes mellitus without complications: Secondary | ICD-10-CM | POA: Diagnosis not present

## 2021-09-15 DIAGNOSIS — I350 Nonrheumatic aortic (valve) stenosis: Secondary | ICD-10-CM | POA: Diagnosis not present

## 2021-09-15 DIAGNOSIS — E669 Obesity, unspecified: Secondary | ICD-10-CM | POA: Diagnosis not present

## 2021-09-15 DIAGNOSIS — D509 Iron deficiency anemia, unspecified: Secondary | ICD-10-CM | POA: Diagnosis not present

## 2021-09-15 DIAGNOSIS — I10 Essential (primary) hypertension: Secondary | ICD-10-CM | POA: Diagnosis not present

## 2021-09-15 DIAGNOSIS — E1165 Type 2 diabetes mellitus with hyperglycemia: Secondary | ICD-10-CM | POA: Diagnosis not present

## 2021-09-22 DIAGNOSIS — I2510 Atherosclerotic heart disease of native coronary artery without angina pectoris: Secondary | ICD-10-CM | POA: Diagnosis not present

## 2021-09-22 DIAGNOSIS — D509 Iron deficiency anemia, unspecified: Secondary | ICD-10-CM | POA: Diagnosis not present

## 2021-09-22 DIAGNOSIS — Q2730 Arteriovenous malformation, site unspecified: Secondary | ICD-10-CM | POA: Diagnosis not present

## 2021-09-22 DIAGNOSIS — D649 Anemia, unspecified: Secondary | ICD-10-CM | POA: Diagnosis not present

## 2021-09-22 DIAGNOSIS — E119 Type 2 diabetes mellitus without complications: Secondary | ICD-10-CM | POA: Diagnosis not present

## 2021-09-22 DIAGNOSIS — Z6834 Body mass index (BMI) 34.0-34.9, adult: Secondary | ICD-10-CM | POA: Diagnosis not present

## 2021-09-22 DIAGNOSIS — I10 Essential (primary) hypertension: Secondary | ICD-10-CM | POA: Diagnosis not present

## 2021-09-22 DIAGNOSIS — E669 Obesity, unspecified: Secondary | ICD-10-CM | POA: Diagnosis not present

## 2021-09-22 DIAGNOSIS — I350 Nonrheumatic aortic (valve) stenosis: Secondary | ICD-10-CM | POA: Diagnosis not present

## 2021-09-22 DIAGNOSIS — K921 Melena: Secondary | ICD-10-CM | POA: Diagnosis not present

## 2021-09-24 ENCOUNTER — Ambulatory Visit: Payer: Medicare HMO | Admitting: Gastroenterology

## 2021-09-25 ENCOUNTER — Encounter: Payer: Self-pay | Admitting: *Deleted

## 2021-09-25 ENCOUNTER — Other Ambulatory Visit: Payer: Self-pay

## 2021-09-25 ENCOUNTER — Other Ambulatory Visit: Payer: Self-pay | Admitting: *Deleted

## 2021-09-25 DIAGNOSIS — I2510 Atherosclerotic heart disease of native coronary artery without angina pectoris: Secondary | ICD-10-CM | POA: Diagnosis not present

## 2021-09-25 DIAGNOSIS — E782 Mixed hyperlipidemia: Secondary | ICD-10-CM | POA: Diagnosis not present

## 2021-09-25 DIAGNOSIS — N390 Urinary tract infection, site not specified: Secondary | ICD-10-CM | POA: Diagnosis not present

## 2021-09-25 DIAGNOSIS — I10 Essential (primary) hypertension: Secondary | ICD-10-CM | POA: Diagnosis not present

## 2021-09-25 DIAGNOSIS — E118 Type 2 diabetes mellitus with unspecified complications: Secondary | ICD-10-CM | POA: Diagnosis not present

## 2021-09-25 NOTE — Patient Outreach (Signed)
[place] [facility]) Care Management  09/25/2021  MYLINDA BROOK 1946-09-23 [id number]   [facility] outreach to complex care patient   Mrs Verrilli was referred to [facility] on 05/29/20 for EMMI general discharge (on APL) after her discharge from [place] for Day # 4, Wednesday 05/28/20 1012 for Unfilled prescriptions. This EMMI was addressed on 06/03/20 when was reach via phone and resolved on 08/01/20 [facility] RN CM consulted with [facility] pharmacy staff, Dr Abbey Chatters who had also received a pharmacy referral for Lantus and Spiriva from the patient's MD office in June 2021  Mrs Carpenito completed patient assistance applications for Spiriva She continues to be followed for [facility] complex care services    Insurance: Bernadene Person      Mrs Rappa is able to verify HIPAA identifiers   Follow up assessment Anemia She reports she is doing better today but her husband is not doing well (low hemoglobin-ED visit, oncology patient) Pt and RN CM discuss their home water services after recognizing the common medical condition for her and her husband.  They have home well water in her mobile home community She discussed other community members also with medical issues  She reports she was informed that the water was checked last about "eight years ago"  She was encouraged to discuss the well water system with her pcp  She is to follow up with her primary care provider (PCP) today  Hemoglobin device She reports not receiving the e-mail information on hemoglobin devices nor her EMMIs sent  She and RN CM verified the e-mail address It is correct in EPIC She has not checked her spam but will do so RN CM re e-mailed the information while speaking with her plus sent via mail with EMMIs   Plans Patient agrees to care plan and follow up within the next 30 business days     Apolonio Cutting L. [name], RN, BSN, [place] Coordinator Office number [phone] Main [facility] number [phone] Fax number [phone]

## 2021-09-29 DIAGNOSIS — Z6834 Body mass index (BMI) 34.0-34.9, adult: Secondary | ICD-10-CM | POA: Diagnosis not present

## 2021-09-29 DIAGNOSIS — K921 Melena: Secondary | ICD-10-CM | POA: Diagnosis not present

## 2021-09-29 DIAGNOSIS — E669 Obesity, unspecified: Secondary | ICD-10-CM | POA: Diagnosis not present

## 2021-09-29 DIAGNOSIS — I350 Nonrheumatic aortic (valve) stenosis: Secondary | ICD-10-CM | POA: Diagnosis not present

## 2021-09-29 DIAGNOSIS — E119 Type 2 diabetes mellitus without complications: Secondary | ICD-10-CM | POA: Diagnosis not present

## 2021-09-29 DIAGNOSIS — I2510 Atherosclerotic heart disease of native coronary artery without angina pectoris: Secondary | ICD-10-CM | POA: Diagnosis not present

## 2021-09-29 DIAGNOSIS — I10 Essential (primary) hypertension: Secondary | ICD-10-CM | POA: Diagnosis not present

## 2021-09-29 DIAGNOSIS — D509 Iron deficiency anemia, unspecified: Secondary | ICD-10-CM | POA: Diagnosis not present

## 2021-09-29 DIAGNOSIS — D649 Anemia, unspecified: Secondary | ICD-10-CM | POA: Diagnosis not present

## 2021-09-29 DIAGNOSIS — Q2730 Arteriovenous malformation, site unspecified: Secondary | ICD-10-CM | POA: Diagnosis not present

## 2021-10-15 DIAGNOSIS — E1165 Type 2 diabetes mellitus with hyperglycemia: Secondary | ICD-10-CM | POA: Diagnosis not present

## 2021-10-16 NOTE — Progress Notes (Signed)
[place]  [phone]) [phone] [phone]) [phone]  ID: Joyce Potter OB: 05/13/1946  MR#: [id number]  KCL#:[id number]  Patient Care Team: [name], MD as PCP - General (Internal Medicine) Lloyd Huger, MD as Consulting Physician (Hematology and Oncology) Barbaraann Faster, RN as [place] Management Fath, Javier Docker, MD as Consulting Physician (Cardiology) Solum, Betsey Holiday, MD as Physician Assistant (Endocrinology) Virgel Manifold, MD as Consulting Physician (Gastroenterology) Jacquelin Hawking, NP as Nurse Practitioner (Oncology) Birder Robson, MD as Referring Physician (Ophthalmology)  CHIEF COMPLAINT: Iron deficiency anemia, secondary to chronic blood loss.  INTERVAL HISTORY: Patient returns to clinic today for repeat laboratory work, further evaluation, and consideration of additional IV Venofer.  She currently feels well and is asymptomatic.  She does not complain of any weakness or fatigue today.  She has no neurologic complaints.  She has a good appetite and denies weight loss.  She denies any recent fevers or illnesses.  She denies chest pain, shortness of breath, cough or hemoptysis. She denies any nausea, vomiting, constipation, or diarrhea.  She denies any further melena or hematochezia.  She has no urinary complaints.  Patient offers no specific complaints today.  REVIEW OF SYSTEMS:   Review of Systems  Constitutional: Negative.  Negative for diaphoresis, fever, malaise/fatigue and weight loss.  Eyes: Negative.  Negative for blurred vision.  Respiratory: Negative.  Negative for cough, hemoptysis, shortness of breath and wheezing.   Cardiovascular: Negative.  Negative for chest pain and leg swelling.  Gastrointestinal: Negative.  Negative for abdominal pain, blood in stool and melena.  Genitourinary: Negative.  Negative for flank pain and hematuria.  Musculoskeletal: Negative.  Negative for falls and joint pain.   Skin: Negative.  Negative for rash.  Neurological: Negative.  Negative for dizziness, focal weakness, weakness and headaches.  Psychiatric/Behavioral: Negative.  The patient is not nervous/anxious.    As per HPI. Otherwise, a complete review of systems is negative.  PAST MEDICAL HISTORY: Past Medical History:  Diagnosis Date   Anemia    iron deficiency   Aortic stenosis    Basal cell carcinoma    CAD (coronary artery disease)    s/p Left circumflex stent in 2012   Carotid stenosis    Cataract    High cholesterol    HTN (hypertension)    Hyperlipidemia    IDDM (insulin dependent diabetes mellitus)    Multiple thyroid nodules    Benign   Peptic ulcer disease    Retinal artery occlusion    on left    PAST SURGICAL HISTORY: Past Surgical History:  Procedure Laterality Date   ABDOMINAL HYSTERECTOMY     ARTERY BIOPSY Left 11/19/2015   Procedure: BIOPSY TEMPORAL ARTERY;  Surgeon: Algernon Huxley, MD;  Location: [facility] ORS;  Service: Vascular;  Laterality: Left;   BREAST BIOPSY Left 2002   core- neg   BREAST EXCISIONAL BIOPSY     ??? not visible scar on neither breast- pt believes it to be right breast    CARDIAC CATHETERIZATION N/A 08/08/2015   Procedure: Right and Left Heart Cath and Coronary Angiography;  Surgeon: Teodoro Spray, MD;  Location: [place] [facility];  Service: Cardiovascular;  Laterality: N/A;   CARDIAC CATHETERIZATION Bilateral 09/03/2016   Procedure: Right/Left Heart Cath and Coronary Angiography;  Surgeon: Teodoro Spray, MD;  Location: [place] [facility];  Service: Cardiovascular;  Laterality: Bilateral;   CATARACT EXTRACTION W/ INTRAOCULAR LENS  IMPLANT, BILATERAL  COLONOSCOPY     in 2013- internal hemorrhoids   COLONOSCOPY WITH PROPOFOL N/A 07/07/2018   Procedure: COLONOSCOPY WITH PROPOFOL;  Surgeon: Manya Silvas, MD;  Location: [facility] ENDOSCOPY;  Service: Endoscopy;  Laterality: N/A;   COLONOSCOPY WITH PROPOFOL N/A 05/22/2020   Procedure: COLONOSCOPY  WITH PROPOFOL;  Surgeon: Jonathon Bellows, MD;  Location: [facility] ENDOSCOPY;  Service: Gastroenterology;  Laterality: N/A;   CORONARY ANGIOGRAPHY N/A 09/14/2017   Procedure: CORONARY ANGIOGRAPHY;  Surgeon: Teodoro Spray, MD;  Location: [place] [facility];  Service: Cardiovascular;  Laterality: N/A;   CORONARY ANGIOPLASTY     CORONARY STENT INTERVENTION N/A 05/15/2020   Procedure: coronary angiography;  Surgeon: Teodoro Spray, MD;  Location: [place] [facility];  Service: Cardiovascular;  Laterality: N/A;   CORONARY STENT INTERVENTION N/A 05/15/2020   Procedure: CORONARY STENT INTERVENTION;  Surgeon: Isaias Cowman, MD;  Location: [place] [facility];  Service: Cardiovascular;  Laterality: N/A;   CORONARY STENT PLACEMENT     ENDARTERECTOMY Left 10/01/2015   Procedure: ENDARTERECTOMY CAROTID;  Surgeon: Algernon Huxley, MD;  Location: [facility] ORS;  Service: Vascular;  Laterality: Left;   ENTEROSCOPY N/A 07/24/2020   Procedure: ENTEROSCOPY;  Surgeon: Virgel Manifold, MD;  Location: [facility] ENDOSCOPY;  Service: Endoscopy;  Laterality: N/A;   ESOPHAGOGASTRODUODENOSCOPY     ESOPHAGOGASTRODUODENOSCOPY (EGD) WITH PROPOFOL N/A 05/31/2016   Procedure: ESOPHAGOGASTRODUODENOSCOPY (EGD) WITH PROPOFOL;  Surgeon: Manya Silvas, MD;  Location: [facility];  Service: Endoscopy;  Laterality: N/A;   ESOPHAGOGASTRODUODENOSCOPY (EGD) WITH PROPOFOL N/A 07/07/2018   Procedure: ESOPHAGOGASTRODUODENOSCOPY (EGD) WITH PROPOFOL;  Surgeon: Manya Silvas, MD;  Location: [facility] ENDOSCOPY;  Service: Endoscopy;  Laterality: N/A;   ESOPHAGOGASTRODUODENOSCOPY (EGD) WITH PROPOFOL N/A 07/17/2018   Procedure: ESOPHAGOGASTRODUODENOSCOPY (EGD) WITH PROPOFOL;  Surgeon: Lucilla Lame, MD;  Location: [facility] ENDOSCOPY;  Service: Endoscopy;  Laterality: N/A;   ESOPHAGOGASTRODUODENOSCOPY (EGD) WITH PROPOFOL N/A 05/21/2020   Procedure: ESOPHAGOGASTRODUODENOSCOPY (EGD) WITH PROPOFOL;  Surgeon: Jonathon Bellows, MD;  Location: [facility] ENDOSCOPY;  Service:  Gastroenterology;  Laterality: N/A;   ESOPHAGOGASTRODUODENOSCOPY (EGD) WITH PROPOFOL N/A 06/13/2020   Procedure: ESOPHAGOGASTRODUODENOSCOPY (EGD) WITH PROPOFOL;  Surgeon: Lucilla Lame, MD;  Location: [facility] ENDOSCOPY;  Service: Endoscopy;  Laterality: N/A;   EYE SURGERY     GIVENS CAPSULE STUDY N/A 04/17/2013   Procedure: GIVENS CAPSULE STUDY;  Surgeon: Arta Silence, MD;  Location: [facility] ENDOSCOPY;  Service: Endoscopy;  Laterality: N/A;  patient ate breakfast at [place] 06/11/2020   Procedure: GIVENS CAPSULE STUDY;  Surgeon: Virgel Manifold, MD;  Location: [facility] ENDOSCOPY;  Service: Endoscopy;  Laterality: N/A;   GIVENS CAPSULE STUDY N/A 01/29/2021   Procedure: GIVENS CAPSULE STUDY;  Surgeon: Virgel Manifold, MD;  Location: [facility] ENDOSCOPY;  Service: Endoscopy;  Laterality: N/A;   GIVENS CAPSULE STUDY N/A 08/24/2021   Procedure: GIVENS CAPSULE STUDY;  Surgeon: Virgel Manifold, MD;  Location: [facility] ENDOSCOPY;  Service: Endoscopy;  Laterality: N/A;   HEMORRHOID BANDING  07/27/2016   Dr Nicholes Stairs   PARTIAL HYSTERECTOMY     RIGHT AND LEFT HEART CATH Bilateral 09/14/2017   Procedure: RIGHT AND LEFT HEART CATH;  Surgeon: Teodoro Spray, MD;  Location: [place] [facility];  Service: Cardiovascular;  Laterality: Bilateral;   RIGHT/LEFT HEART CATH AND CORONARY ANGIOGRAPHY Right 05/15/2020   Procedure: Right/Left Heart cath;  Surgeon: Teodoro Spray, MD;  Location: [place] [facility] LAB;  Service: Cardiovascular;  Laterality: Right;   TRIGGER FINGER RELEASE  FAMILY HISTORY Family History  Problem Relation Age of Onset   Uterine cancer Mother    Breast cancer Mother 25   Seizures Father    Stroke Father    Diabetes Father    COPD Father    Colon cancer Neg Hx       ADVANCED DIRECTIVES:    HEALTH MAINTENANCE: Social History   Tobacco Use   Smoking status: Former    Types: Cigarettes    Quit date: 02/15/1985    Years since quitting: 36.7   Smokeless  tobacco: Never   Tobacco comments:    quit about 31 years ago  Vaping Use   Vaping Use: Never used  Substance Use Topics   Alcohol use: No    Alcohol/week: 0.0 standard drinks   Drug use: No    Colonoscopy:  PAP:  Bone density: Osteoporosis- DEXA 06/2017 (on fosamax)  Lipid panel:  Allergies  Allergen Reactions   Sulfa Antibiotics Other (See Comments)    Altered Mental Status   Invokamet [Canagliflozin-Metformin Hcl] Other (See Comments)    Yeast Infection   Lovastatin Rash   Penicillins Rash    Has patient had a PCN reaction causing immediate rash, facial/tongue/throat swelling, SOB or lightheadedness with hypotension:Yes Has patient had a PCN reaction causing severe rash involving mucus membranes or skin necrosis:all over body Has patient had a PCN reaction that required hospitalization: No Has patient had a PCN reaction occurring within the last 10 years: No If all of the above answers are "NO", then may proceed with Cephalosporin use.    Vicodin [Hydrocodone-Acetaminophen] Rash    Current Outpatient Medications  Medication Sig Dispense Refill   albuterol (VENTOLIN HFA) 108 (90 Base) MCG/ACT inhaler Inhale 2 puffs into the lungs every 4 (four) hours as needed for wheezing or shortness of breath.      alendronate (FOSAMAX) 70 MG tablet Take 70 mg by mouth once a week.      amitriptyline (ELAVIL) 25 MG tablet Take 25 mg by mouth at bedtime as needed for sleep.      amitriptyline (ELAVIL) 25 MG tablet Take by mouth.     aspirin 81 MG EC tablet Take by mouth.     aspirin EC 81 MG EC tablet Take 1 tablet (81 mg total) by mouth daily. Swallow whole. 30 tablet 11   atorvastatin (LIPITOR) 20 MG tablet Take 20 mg by mouth daily.     atorvastatin (LIPITOR) 20 MG tablet Take by mouth.     baclofen (LIORESAL) 10 MG tablet Take by mouth.     BINAXNOW COVID-19 AG HOME TEST KIT Use as Directed on the Package     clopidogrel (PLAVIX) 75 MG tablet Take 1 tablet (75 mg total) by mouth  daily with breakfast. 30 tablet 11   diclofenac Sodium (VOLTAREN) 1 % GEL Apply topically.     Dulaglutide 1.5 MG/0.5ML SOPN Inject 1.5 mg into the skin every Saturday.      DULoxetine (CYMBALTA) 20 MG capsule Take 20 mg by mouth 2 (two) times daily.     DULoxetine (CYMBALTA) 20 MG capsule Take by mouth.     ferrous sulfate 325 (65 FE) MG tablet Take by mouth.     gabapentin (NEURONTIN) 300 MG capsule Take 300 mg by mouth 2 (two) times daily.      gabapentin (NEURONTIN) 300 MG capsule Take by mouth.     glucose monitoring kit (FREESTYLE) monitoring kit 1 each by Does not apply route as needed for other.  insulin NPH-regular Human (NOVOLIN 70/30) (70-30) 100 UNIT/ML injection Inject 30 Units into the skin 2 (two) times daily with a meal.      meloxicam (MOBIC) 15 MG tablet Take 15 mg by mouth daily.     metoCLOPramide (REGLAN) 5 MG tablet      metoprolol tartrate (LOPRESSOR) 100 MG tablet Take 100 mg by mouth daily.     Multiple Vitamin (MULTI-VITAMIN) tablet Take 1 tablet by mouth daily.     ONETOUCH VERIO test strip SMARTSIG:Via Meter     pantoprazole (PROTONIX) 40 MG tablet Take by mouth.     saccharomyces boulardii (FLORASTOR) 250 MG capsule Take 250 mg by mouth 2 (two) times daily.      SPIRIVA HANDIHALER 18 MCG inhalation capsule Place 18 mcg into inhaler and inhale daily in the afternoon.      traMADol (ULTRAM) 50 MG tablet Take 50 mg by mouth every 6 (six) hours as needed for moderate pain.      Vitamin A 2400 MCG (8000 UT) CAPS Take by mouth.     No current facility-administered medications for this visit.   Facility-Administered Medications Ordered in Other Visits  Medication Dose Route Frequency Provider Last Rate Last Admin   heparin lock flush 100 unit/mL  500 Units Intravenous Once Verlon Au, NP       sodium chloride flush (NS) 0.9 % injection 10 mL  10 mL Intravenous Once Verlon Au, NP        OBJECTIVE: Vitals:   10/20/21 1313  BP: (!) 154/61  Pulse: 62   Resp: 18  Temp: 97.7 F (36.5 C)  SpO2: 98%     Body mass index is 36.21 kg/m.    ECOG FS:0 - Asymptomatic  General: Well-developed, well-nourished, no acute distress. Eyes: Pink conjunctiva, anicteric sclera. HEENT: Normocephalic, moist mucous membranes. Lungs: No audible wheezing or coughing. Heart: Regular rate and rhythm. Abdomen: Soft, nontender, no obvious distention. Musculoskeletal: No edema, cyanosis, or clubbing. Neuro: Alert, answering all questions appropriately. Cranial nerves grossly intact. Skin: No rashes or petechiae noted. Psych: Normal affect.   LAB RESULTS:  Lab Results  Component Value Date   NA 141 07/24/2020   K 4.1 07/24/2020   CL 104 07/24/2020   CO2 26 07/24/2020   GLUCOSE 102 (H) 07/24/2020   BUN 8 07/24/2020   CREATININE 0.79 07/24/2020   CALCIUM 8.5 (L) 07/24/2020   PROT 7.0 07/21/2020   ALBUMIN 3.8 07/21/2020   AST 18 07/21/2020   ALT 14 07/21/2020   ALKPHOS 72 07/21/2020   BILITOT 0.6 07/21/2020   GFRNONAA >60 07/24/2020   GFRAA >60 07/24/2020    Lab Results  Component Value Date   WBC 4.2 10/19/2021   NEUTROABS 2.1 10/19/2021   HGB 11.4 (L) 10/19/2021   HCT 35.6 (L) 10/19/2021   MCV 88.3 10/19/2021   PLT 234 10/19/2021   Lab Results  Component Value Date   IRON 55 10/19/2021   TIBC 297 10/19/2021   IRONPCTSAT 19 10/19/2021    Lab Results  Component Value Date   FERRITIN 45 10/19/2021     STUDIES: No results found.  ASSESSMENT: Iron deficiency anemia, secondary to chronic blood loss.  PLAN:    1. Iron deficiency anemia: Patient's hemoglobin is nearly within normal limits at 11.4 and her iron stores continue to be within normal limits.  Patient underwent capsule endoscopy in February 2022 that only revealed 1 nonbleeding lymphangiectasia.  Prior to that patient's most recent luminal evaluation was in  July 2021. Bone marrow biopsy on February 15, 2017 did not reveal any significant pathology.  Patient does not  require additional IV Venofer today.  She last received treatment on August 21, 2021.  Return to clinic in 3 months with repeat laboratory work, further evaluation, and consideration of additional treatment if needed.   2.  Melena: Patient does not complain of this today.  Continue follow-up with GI as scheduled. 3.  Hypertension: Patient's blood pressure is moderately elevated today.  Continue evaluation and treatment per primary care.   Patient expressed understanding and was in agreement with this plan. She also understands that She can call clinic at any time with any questions, concerns, or complaints.    Lloyd Huger, MD 10/20/21 2:20 PM

## 2021-10-19 ENCOUNTER — Other Ambulatory Visit: Payer: Self-pay

## 2021-10-19 ENCOUNTER — Inpatient Hospital Stay: Payer: Medicare HMO | Attending: Oncology

## 2021-10-19 DIAGNOSIS — Z87891 Personal history of nicotine dependence: Secondary | ICD-10-CM | POA: Insufficient documentation

## 2021-10-19 DIAGNOSIS — D500 Iron deficiency anemia secondary to blood loss (chronic): Secondary | ICD-10-CM

## 2021-10-19 DIAGNOSIS — D509 Iron deficiency anemia, unspecified: Secondary | ICD-10-CM | POA: Insufficient documentation

## 2021-10-19 DIAGNOSIS — I10 Essential (primary) hypertension: Secondary | ICD-10-CM | POA: Insufficient documentation

## 2021-10-19 LAB — IRON AND TIBC
Iron: 55 ug/dL (ref 28–170)
Saturation Ratios: 19 % (ref 10.4–31.8)
TIBC: 297 ug/dL (ref 250–450)
UIBC: 242 ug/dL

## 2021-10-19 LAB — CBC WITH DIFFERENTIAL/PLATELET
Abs Immature Granulocytes: 0.01 10*3/uL (ref 0.00–0.07)
Basophils Absolute: 0.1 10*3/uL (ref 0.0–0.1)
Basophils Relative: 1 %
Eosinophils Absolute: 0.3 10*3/uL (ref 0.0–0.5)
Eosinophils Relative: 7 %
HCT: 35.6 % — ABNORMAL LOW (ref 36.0–46.0)
Hemoglobin: 11.4 g/dL — ABNORMAL LOW (ref 12.0–15.0)
Immature Granulocytes: 0 %
Lymphocytes Relative: 31 %
Lymphs Abs: 1.3 10*3/uL (ref 0.7–4.0)
MCH: 28.3 pg (ref 26.0–34.0)
MCHC: 32 g/dL (ref 30.0–36.0)
MCV: 88.3 fL (ref 80.0–100.0)
Monocytes Absolute: 0.5 10*3/uL (ref 0.1–1.0)
Monocytes Relative: 12 %
Neutro Abs: 2.1 10*3/uL (ref 1.7–7.7)
Neutrophils Relative %: 49 %
Platelets: 234 10*3/uL (ref 150–400)
RBC: 4.03 MIL/uL (ref 3.87–5.11)
RDW: 13.5 % (ref 11.5–15.5)
WBC: 4.2 10*3/uL (ref 4.0–10.5)
nRBC: 0 % (ref 0.0–0.2)

## 2021-10-19 LAB — FERRITIN: Ferritin: 45 ng/mL (ref 11–307)

## 2021-10-20 ENCOUNTER — Inpatient Hospital Stay (HOSPITAL_BASED_OUTPATIENT_CLINIC_OR_DEPARTMENT_OTHER): Payer: Medicare HMO | Admitting: Oncology

## 2021-10-20 ENCOUNTER — Inpatient Hospital Stay: Payer: Medicare HMO

## 2021-10-20 VITALS — BP 154/61 | HR 62 | Temp 97.7°F | Resp 18 | Wt 204.4 lb

## 2021-10-20 DIAGNOSIS — D509 Iron deficiency anemia, unspecified: Secondary | ICD-10-CM | POA: Diagnosis not present

## 2021-10-20 DIAGNOSIS — Z87891 Personal history of nicotine dependence: Secondary | ICD-10-CM | POA: Diagnosis not present

## 2021-10-20 DIAGNOSIS — I10 Essential (primary) hypertension: Secondary | ICD-10-CM | POA: Diagnosis not present

## 2021-10-20 NOTE — Progress Notes (Signed)
Pt has questions regarding differences between [facility] and [facility] labs. No other questions/concerns at this time.

## 2021-10-23 ENCOUNTER — Other Ambulatory Visit: Payer: Self-pay | Admitting: *Deleted

## 2021-10-23 NOTE — Patient Outreach (Signed)
[facility]) Care Management  10/23/2021  HAIDEN CLUCAS 10/06/1946 [id number]   [facility] Unsuccessful outreach  Mrs Ronda was referred to [facility] on 05/29/20 for EMMI general discharge (on APL) after her discharge from [place] for Day # 4, Wednesday 05/28/20 1012 for Unfilled prescriptions. This EMMI was addressed on 06/03/20 when was reach via phone and resolved on 08/01/20 [facility] RN CM consulted with [facility] pharmacy staff, Dr Abbey Chatters who had also received a pharmacy referral for Lantus and Spiriva from the patient's MD office in June 2021  Mrs Ferrucci completed patient assistance applications for Spiriva She continues to be followed for [facility] complex care services    Insurance: [place] attempt to the home number [phone] No answer. [facility] RN CM left HIPAA Advanced Pain Management Portability and Accountability Act) compliant voicemail message along with CM's contact info.   Plan: [facility] RN CM scheduled this patient for another call attempt within 4-7 business days Unsuccessful outreach on 10/23/21   Shalamar Crays L. [name], RN, BSN, [place] Coordinator Office number [phone] Mobile number [phone]  Main [facility] number [phone] Fax number [phone]

## 2021-10-26 DIAGNOSIS — I2510 Atherosclerotic heart disease of native coronary artery without angina pectoris: Secondary | ICD-10-CM | POA: Diagnosis not present

## 2021-10-26 DIAGNOSIS — E1165 Type 2 diabetes mellitus with hyperglycemia: Secondary | ICD-10-CM | POA: Diagnosis not present

## 2021-10-26 DIAGNOSIS — E1169 Type 2 diabetes mellitus with other specified complication: Secondary | ICD-10-CM | POA: Diagnosis not present

## 2021-10-26 DIAGNOSIS — G4733 Obstructive sleep apnea (adult) (pediatric): Secondary | ICD-10-CM | POA: Diagnosis not present

## 2021-10-26 DIAGNOSIS — R002 Palpitations: Secondary | ICD-10-CM | POA: Diagnosis not present

## 2021-10-26 DIAGNOSIS — I350 Nonrheumatic aortic (valve) stenosis: Secondary | ICD-10-CM | POA: Diagnosis not present

## 2021-10-26 DIAGNOSIS — K5521 Angiodysplasia of colon with hemorrhage: Secondary | ICD-10-CM | POA: Diagnosis not present

## 2021-10-26 DIAGNOSIS — I739 Peripheral vascular disease, unspecified: Secondary | ICD-10-CM | POA: Diagnosis not present

## 2021-10-26 DIAGNOSIS — I700 Atherosclerosis of aorta: Secondary | ICD-10-CM | POA: Diagnosis not present

## 2021-10-26 DIAGNOSIS — I10 Essential (primary) hypertension: Secondary | ICD-10-CM | POA: Diagnosis not present

## 2021-10-29 ENCOUNTER — Other Ambulatory Visit: Payer: Self-pay | Admitting: *Deleted

## 2021-10-29 DIAGNOSIS — I672 Cerebral atherosclerosis: Secondary | ICD-10-CM | POA: Diagnosis not present

## 2021-10-29 DIAGNOSIS — R519 Headache, unspecified: Secondary | ICD-10-CM | POA: Diagnosis not present

## 2021-10-29 DIAGNOSIS — E1142 Type 2 diabetes mellitus with diabetic polyneuropathy: Secondary | ICD-10-CM | POA: Diagnosis not present

## 2021-10-29 NOTE — Patient Outreach (Signed)
Joyce Potter) Care Management  10/29/2021  Joyce Potter [birth year] [id number]   Joyce Potter outreach   Joyce Potter was referred to [facility] on 05/29/20 for EMMI general discharge (on APL) after her discharge from [place] for Day # 4, Wednesday 05/28/20 1012 for Unfilled prescriptions. This EMMI was addressed on 06/03/20 when was reach via phone and resolved on 08/01/20 Joyce Potter consulted with Joyce Potter, Joyce Abbey Chatters who had also received a pharmacy referral for Lantus and Spiriva from the patient's MD office in June 2021  Joyce Potter completed patient assistance applications for Spiriva She continues to be followed for [facility] complex care services    Insurance: Bernadene Person    Joyce Potter is able to verify her HIPAA identifiers  Present concern Medication management inquiry Eliquis for spouse Joyce Potter - He has entered Donut hole and sees Joyce Potter Pt discusses concern with spouse maintaining use of Eliquis to prevent clotting and stroke risks  Care Plan : [place] of Care  Updates made by Joyce Faster, RN since 10/30/2021 12:00 AM     Problem: Complex Care Coordination Needs and disease management in patient with Chronic Anemia, Diabetes, HTN, AVM   Priority: High     Long-Range Goal: Establish Plan of Care for Management Complex SDOH Barriers, Disease management and Care Coordination Needs in patient with Chronic anema, diabetes, AVM, HTN   Start Date: 10/30/2021  Priority: High  Note:   Current Barriers:  Knowledge Deficits related to plan of care for management of HTN, DMII, and chronic anemia, AVM Care Coordination needs related to Limited education about chronic anemia, diabetes, HTN and AVM*  RNCM Clinical Goal(s):  Patient will verbalize understanding of plan for management of HTN, DMII, and chronic anemia, AVM  through collaboration with RN Care manager, provider, and care team.   Interventions: Encouraged  patient to outreach as needed to RN Potter or 24 hr nurse line  Inter-disciplinary care team collaboration (see longitudinal plan of care) Evaluation of current treatment plan related to  self management and patient's adherence to plan as established by provider  Hypertension Interventions: Last practice recorded BP readings:  BP Readings from Last 3 Encounters:  10/20/21 (!) 154/61  08/21/21 136/72  08/19/21 (!) 156/67  Most recent eGFR/CrCl: No results found for: EGFR  No components found for: CRCL  Evaluation of current treatment plan related to hypertension self management and patient's adherence to plan as established by provider; Reviewed medications with patient and discussed importance of compliance;  Diabetes Interventions: Assessed patient's understanding of A1c goal: <7% Provided education to patient about basic DM disease process; Reviewed medications with patient and discussed importance of medication adherence; Discussed plans with patient for ongoing care management follow up and provided patient with direct contact information for care management team; Lab Results  Component Value Date   HGBA1C 7.3 (H) 05/21/2020  Anemia/Bleeding Interventions:  Assessment of understanding of anemia/bleeding disorder diagnosis  Basic overview and discussion of anemia/bleeding disorder or acute disease state  Medications reviewed  Counseled on importance of regular laboratory monitoring as directed by provider; Provided education about signs and symptoms of active bleeding such as stomach discomfort, coughing up blood or blood tinged secretions, bleeding from the gums/teeth, nosebleeds, increased bruising, blood in the urine/stool and/or if a traumatic injury occurs, regardless of severity of injury;  Assessed social determinant of health barriers;  Lab Results  Component Value Date  WBC 4.2 10/19/2021   HGB 11.4 (L) 10/19/2021   HCT 35.6 (L) 10/19/2021   MCV 88.3 10/19/2021   PLT 234  10/19/2021   Patient Goals/Self-Care Activities: Patient will self administer medications as prescribed Patient will attend all scheduled provider appointments Patient will continue to perform ADL's independently Patient will continue to perform IADL's independently Patient will call provider office for new concerns or questions Patient will complete forms received for spouse from Joyce Potter, attach the supporting documents she used early in 2022 for herself and request MD Potter assistance with faxing the documents to the medication patient assistance company for a possible expedited response. She will outreach to RN Potter for any further questions/assistance   Follow Up Plan:  The patient has been provided with contact information for the care management team and has been advised to call with any health related questions or concerns.  The care management team will reach out to the patient again over the next 30 business days.       Patient Active Problem List   Diagnosis Date Noted   Pain in limb 02/20/2021   AVM (arteriovenous malformation) 01/09/2021   Aortic atherosclerosis ([place]) 08/12/2020   Anemia 07/22/2020   Acute on chronic anemia 07/21/2020   History of blood transfusion 06/27/2020   OSA (obstructive sleep apnea) 06/27/2020   GI bleeding 06/12/2020   Diabetes mellitus without complication ([place]) 60/60/0459   Anemia due to blood loss 05/20/2020   Blood loss anemia    Chronic fatigue 03/21/2020   Type 2 diabetes mellitus, with long-term current use of insulin ([place]) 03/21/2020   Obesity, Class II, BMI 35-39.9 03/21/2020   Snoring 03/21/2020   Background diabetic retinopathy associated with type 2 diabetes mellitus ([place]) 06/06/2019   Uncontrolled type 2 diabetes mellitus with hyperglycemia ([place]) 04/30/2019   Osteoporosis, post-menopausal 04/30/2019   Multiple rib fractures 10/11/2018   GIB (gastrointestinal bleeding) 07/17/2018   Melena    Symptomatic anemia    PVD (peripheral  vascular disease) ([place]) 02/11/2017   Iron deficiency anemia due to chronic blood loss 07/13/2016   Multinodular goiter 01/25/2016   Central retinal artery occlusion 10/01/2015   Carotid stenosis 10/01/2015   Stroke ([place]) 09/28/2015   Aortic stenosis 11/29/2014   PUD (peptic ulcer disease) 07/17/2014   HLD (hyperlipidemia) 05/07/2013   GI bleed from an occult source with recurrent chronic blood loss anemia 04/14/2013   CAD (coronary artery disease) 04/14/2013   IDDM (insulin dependent diabetes mellitus) 04/14/2013   HTN (hypertension) 04/14/2013   Chest pain 04/14/2013    Plan  [facility] RN Potter scheduled this patient for another call attempt within 30 business days She agrees with the plan of care  Constanza Mincy L. [name], RN, BSN, [place] Coordinator Office number [phone] Main [facility] number [phone] Fax number [phone]

## 2021-11-02 ENCOUNTER — Ambulatory Visit: Payer: Self-pay | Admitting: *Deleted

## 2021-11-04 ENCOUNTER — Telehealth: Payer: Self-pay | Admitting: Pharmacy Technician

## 2021-11-04 DIAGNOSIS — Z596 Low income: Secondary | ICD-10-CM

## 2021-11-04 NOTE — Progress Notes (Signed)
[place] [facility])                                            [place] Team    11/04/2021  Joyce Potter 11/25/1946 [id number]  FOR 2023 RE ENROLLMENT                                      Medication Assistance Referral  Referral From: [place]  Medication/Company: [employer] 70/30 / [employer] Patient application portion:  Education officer, museum portion: Faxed  to Dr. Gabriel Carina Provider address/fax verified via: Office website  Medication/Company: Danelle Berry / [employer] Patient application portion:  Mailed Provider application portion: Faxed  to Dr. Gabriel Carina Provider address/fax verified via: Office website  Medication/Company: Milton Ferguson / [employer] Patient application portion:  Mailed Provider application portion: Faxed  to Dr. Ubaldo Glassing Provider address/fax verified via: Office website   Joyce Potter, [place]  [phone]

## 2021-11-10 ENCOUNTER — Other Ambulatory Visit: Payer: Self-pay | Admitting: *Deleted

## 2021-11-10 NOTE — Patient Outreach (Signed)
[place] [facility]) Care Management  11/10/2021  GREY SCHLAUCH 08-20-1946 [id number]   [facility] Unsuccessful outreach   Outreach attempt to the listed at the preferred outreach number in EPIC  No answer. [facility] RN CM left HIPAA [facility] Portability and Accountability Act) compliant voicemail message along with CM's contact info.   Plan: [facility] RN CM scheduled this patient for another call attempt within 4-7 business days Unsuccessful outreach on 11/10/21   Courteny Egler L. [name], RN, BSN, [place] Coordinator Office number [phone] Mobile number [phone]  Main [facility] number [phone] Fax number [phone]

## 2021-11-14 DIAGNOSIS — E1165 Type 2 diabetes mellitus with hyperglycemia: Secondary | ICD-10-CM | POA: Diagnosis not present

## 2021-11-17 ENCOUNTER — Telehealth: Payer: Self-pay | Admitting: Pharmacy Technician

## 2021-11-17 DIAGNOSIS — Z596 Low income: Secondary | ICD-10-CM

## 2021-11-17 NOTE — Progress Notes (Signed)
[place] [facility])                                            [place] Team    11/17/2021  RIDLEY DILEO 1946-10-17 [id number]  Received both patient and provider portion(s) of patient assistance application(s) for Humulin 40/98, Trulicity and Spiriva. Faxed completed application and required documents into [employer] and [employer] respectively.    Trella Thurmond P. Belma Dyches, [place]  [phone]

## 2021-11-18 ENCOUNTER — Other Ambulatory Visit: Payer: Self-pay | Admitting: *Deleted

## 2021-11-18 ENCOUNTER — Other Ambulatory Visit: Payer: Self-pay

## 2021-11-18 NOTE — Patient Outreach (Signed)
[place] [facility]) Care Management Telephonic RN Care Manager Note   01/13/2022 Name:  Joyce Potter MRN:  [id number] DOB:  15-Sep-1946  Summary: Followed up with her on medication cost concern Still some head aches on Baclofen and gabapentin She reports she is doing okay but is caring for her husband Joyce Potter who has started infusion but experiencing elevations in BPs  Recommendations/Changes made from today's visit: Questions answered related to elevations in BP management for her and Joyce Potter [facility] progression discussed    Subjective: Joyce Potter is an 75 y.o. year old female who is a primary patient of [name], MD. The care management team was consulted for assistance with care management and/or care coordination needs.    Telephonic RN Care Manager completed Telephone Visit today.   Objective:  Medications Reviewed Today     Reviewed by [name], RN (Registered Nurse) on 10/20/21 at 1313  Med List Status: <None>   Medication Order Taking? Sig Documenting Provider Last Dose Status Informant  albuterol (VENTOLIN HFA) 108 (90 Base) MCG/ACT inhaler [id number] Yes Inhale 2 puffs into the lungs every 4 (four) hours as needed for wheezing or shortness of breath.  [provider] Taking Active Self  alendronate (FOSAMAX) 70 MG tablet [id number] Yes Take 70 mg by mouth once a week.  [provider] Taking Active Self           Med Note [name]   Fri May 30, 2020  4:03 PM)    amitriptyline (ELAVIL) 25 MG tablet [id number] Yes Take 25 mg by mouth at bedtime as needed for sleep.  [provider] Taking Active Self  amitriptyline (ELAVIL) 25 MG tablet [id number] Yes Take by mouth. [provider] Taking Active   aspirin 81 MG EC tablet [id number] Yes Take by mouth. [provider] Taking Active   aspirin EC 81 MG EC tablet [id number] Yes Take 1 tablet (81 mg total) by mouth daily. Swallow whole. [name], MD  Taking Active Self  atorvastatin (LIPITOR) 20 MG tablet [id number] Yes Take 20 mg by mouth daily. [provider] Taking Active   atorvastatin (LIPITOR) 20 MG tablet [id number] Yes Take by mouth. [provider] Taking Active   baclofen (LIORESAL) 10 MG tablet [id number] Yes Take by mouth. [provider] Taking Active   Ocie Bob AG HOME TEST KIT [id number] Yes Use as Directed on the Package [provider] Taking Active   clopidogrel (PLAVIX) 75 MG tablet [id number] Yes Take 1 tablet (75 mg total) by mouth daily with breakfast. [name], [name], MD Taking Active Self  diclofenac Sodium (VOLTAREN) 1 % GEL [id number] Yes Apply topically. [provider] Taking Active   Dulaglutide 1.5 MG/0.5ML SOPN [id number] Yes Inject 1.5 mg into the skin every Saturday.  [provider] Taking Active   DULoxetine (CYMBALTA) 20 MG capsule [id number] Yes Take 20 mg by mouth 2 (two) times daily. [provider] Taking Active   DULoxetine (CYMBALTA) 20 MG capsule [id number] Yes Take by mouth. [provider] Taking Active   ferrous sulfate 325 (65 FE) MG tablet [id number] Yes Take by mouth. [provider] Taking Active   gabapentin (NEURONTIN) 300 MG capsule [id number] Yes Take 300 mg by mouth 2 (two) times daily.  [provider] Taking Active Self  gabapentin (NEURONTIN) 300 MG capsule [id number] Yes Take by mouth. [provider] Taking Active   glucose monitoring kit (FREESTYLE) monitoring kit [id number] Yes 1 each by  Does not apply route as needed for other. [name], MD Taking Active Self  insulin NPH-regular Human (NOVOLIN 70/30) (70-30) 100 UNIT/ML injection [id number] Yes Inject 30 Units into the skin 2 (two) times daily with a meal.  [provider] Taking Active Self  meloxicam (MOBIC) 15 MG tablet [id number] Yes Take 15 mg by mouth daily. [provider] Taking Active   metoCLOPramide  (REGLAN) 5 MG tablet [id number] Yes  [provider] Taking Active   metoprolol tartrate (LOPRESSOR) 100 MG tablet [id number] Yes Take 100 mg by mouth daily. [provider] Taking Active Self  Multiple Vitamin (MULTI-VITAMIN) tablet [id number] Yes Take 1 tablet by mouth daily. [provider] Taking Active   [facility] VERIO test strip [id number] Yes SMARTSIG:Via Meter [provider] Taking Active   pantoprazole (PROTONIX) 40 MG tablet [id number] Yes Take by mouth. [provider] Taking Active   saccharomyces boulardii (FLORASTOR) 250 MG capsule [id number] Yes Take 250 mg by mouth 2 (two) times daily.  [provider] Taking Active Self  SPIRIVA HANDIHALER 18 MCG inhalation capsule [id number] Yes Place 18 mcg into inhaler and inhale daily in the afternoon.  [provider] Taking Active Self  traMADol (ULTRAM) 50 MG tablet [id number] Yes Take 50 mg by mouth every 6 (six) hours as needed for moderate pain.  [provider] Taking Active Self  Vitamin A 2400 MCG (8000 UT) CAPS [id number] Yes Take by mouth. [provider] Taking Active   Med List Note [name], CPhT 05/20/20 1417): Patient stated she did not take her medications this morning as she had to report here at 0730             SDOH:  (Social Determinants of Health) assessments and interventions performed:    Care Plan  Review of patient past medical history, allergies, medications, health status, including review of consultants reports, laboratory and other test data, was performed as part of comprehensive evaluation for care management services.   Care Plan : Wellness (Adult)  Updates made by Barbaraann Faster, RN since 01/13/2022 12:00 AM  Completed 01/13/2022   Care Plan : Hypertension (Adult)  Updates made by Barbaraann Faster, RN since 01/13/2022 12:00 AM  Completed 01/13/2022   Care Plan : RN Care Manager Plan of Care  Updates made by  Barbaraann Faster, RN since 01/13/2022 12:00 AM     Problem: Complex Care Coordination Needs and disease management in patient with Chronic Anemia, Diabetes, HTN, AVM   Priority: High     Long-Range Goal: Establish Plan of Care for Management Complex SDOH Barriers, Disease management and Care Coordination Needs in patient with Chronic anema, diabetes, AVM, HTN   Start Date: 10/30/2021  This Visit's Progress: On track  Priority: High  Note:   Current Barriers:  Knowledge Deficits related to plan of care for management of HTN, DMII, and chronic anemia, AVM Care Coordination needs related to Limited education about chronic anemia, diabetes, HTN and AVM* 11/18/21 Cares for ill spouse Joyce Potter  RNCM Clinical Goal(s):  Patient will verbalize understanding of plan for management of HTN, DMII, and chronic anemia, AVM  through collaboration with RN Care manager, provider, and care team.   Interventions: Encouraged patient to outreach as needed to RN CM or 24 hr nurse line  Inter-disciplinary care team collaboration (see longitudinal plan of care) Evaluation of current treatment plan related to  self management and patient's adherence to plan as established by provider  Hypertension Interventions:  Last practice recorded BP readings:  BP Readings from Last 3 Encounters:  10/20/21 (!) 154/61  08/21/21 136/72  08/19/21 (!) 156/67  Most recent eGFR/CrCl: No results found for: EGFR  No components found for: CRCL  Evaluation of current treatment plan related to hypertension self management and patient's adherence to plan as established by provider; Reviewed medications with patient and discussed importance of compliance;  Diabetes Interventions: Assessed patient's understanding of A1c goal: <7% Provided education to patient about basic DM disease process; Reviewed medications with patient and discussed importance of medication adherence; Discussed plans with patient for ongoing care management  follow up and provided patient with direct contact information for care management team; Lab Results  Component Value Date   HGBA1C 7.3 (H) 05/21/2020  Anemia/Bleeding Interventions:  Assessment of understanding of anemia/bleeding disorder diagnosis  Basic overview and discussion of anemia/bleeding disorder or acute disease state  Medications reviewed  Counseled on importance of regular laboratory monitoring as directed by provider; Provided education about signs and symptoms of active bleeding such as stomach discomfort, coughing up blood or blood tinged secretions, bleeding from the gums/teeth, nosebleeds, increased bruising, blood in the urine/stool and/or if a traumatic injury occurs, regardless of severity of injury;  Assessed social determinant of health barriers;  Lab Results  Component Value Date   WBC 4.2 10/19/2021   HGB 11.4 (L) 10/19/2021   HCT 35.6 (L) 10/19/2021   MCV 88.3 10/19/2021   PLT 234 10/19/2021   Patient Goals/Self-Care Activities: Patient will self administer medications as prescribed Patient will attend all scheduled provider appointments Patient will continue to perform ADL's independently Patient will continue to perform IADL's independently Patient will call provider office for new concerns or questions Patient will complete forms received for spouse from Dr Ubaldo Glassing, attach the supporting documents she used early in 2022 for herself and request MD staff assistance with faxing the documents to the medication patient assistance company for a possible expedited response. She will outreach to RN CM for any further questions/assistance   Follow Up Plan:  The patient has been provided with contact information for the care management team and has been advised to call with any health related questions or concerns.  The care management team will reach out to the patient again over the next 30+ business days.       Plan: The patient has been provided with contact  information for the care management team and has been advised to call with any health related questions or concerns.  The care management team will reach out to the patient again over the next 30+ business days.  Yair Dusza L. [name], RN, BSN, [place] Coordinator Office number [phone] Main [facility] number [phone] Fax number [phone]

## 2021-11-27 DIAGNOSIS — J069 Acute upper respiratory infection, unspecified: Secondary | ICD-10-CM | POA: Diagnosis not present

## 2021-11-27 DIAGNOSIS — Z20822 Contact with and (suspected) exposure to covid-19: Secondary | ICD-10-CM | POA: Diagnosis not present

## 2021-12-02 ENCOUNTER — Ambulatory Visit: Payer: Medicare HMO | Admitting: Gastroenterology

## 2021-12-15 DIAGNOSIS — E1142 Type 2 diabetes mellitus with diabetic polyneuropathy: Secondary | ICD-10-CM | POA: Diagnosis not present

## 2021-12-16 ENCOUNTER — Encounter: Payer: Self-pay | Admitting: *Deleted

## 2021-12-16 ENCOUNTER — Other Ambulatory Visit: Payer: Self-pay | Admitting: *Deleted

## 2021-12-16 NOTE — Patient Outreach (Signed)
[facility]) Care Management Telephonic RN Care Manager Note   01/13/2022 Name:  Joyce Potter MRN:  [id number] DOB:  June 30, 1946  Summary: Joyce Potter with sinus infection and took Zpak that cause elevations in cbgs and she believes her HgA1c may be elevated  Her husband continues chemo but is managing well  Recommendations/Changes made from today's visit: Encouragement provided related to her management of diabetes and care of her spouse    Subjective: Joyce Potter is an 75 y.o. year old female who is a primary patient of Joyce Maltese, MD. The care management team was consulted for assistance with care management and/or care coordination needs.    Telephonic RN Care Manager completed Telephone Visit today.   Objective:  Medications Reviewed Today     Reviewed by Joyce Corner, RN (Registered Nurse) on 10/20/21 at 1313  Med List Status: <None>   Medication Order Taking? Sig Documenting Provider Last Dose Status Informant  albuterol (VENTOLIN HFA) 108 (90 Base) MCG/ACT inhaler [id number] Yes Inhale 2 puffs into the lungs every 4 (four) hours as needed for wheezing or shortness of breath.  [provider] Taking Active Self  alendronate (FOSAMAX) 70 MG tablet [id number] Yes Take 70 mg by mouth once a week.  [provider] Taking Active Self           Med Note Joyce Potter   Fri May 30, 2020  4:03 PM)    amitriptyline (ELAVIL) 25 MG tablet [id number] Yes Take 25 mg by mouth at bedtime as needed for sleep.  [provider] Taking Active Self  amitriptyline (ELAVIL) 25 MG tablet [id number] Yes Take by mouth. [provider] Taking Active   aspirin 81 MG EC tablet [id number] Yes Take by mouth. [provider] Taking Active   aspirin EC 81 MG EC tablet [id number] Yes Take 1 tablet (81 mg total) by mouth daily. Swallow whole. Joyce Dusky, MD Taking Active Self  atorvastatin (LIPITOR) 20 MG tablet [id number] Yes Take 20  mg by mouth daily. [provider] Taking Active   atorvastatin (LIPITOR) 20 MG tablet [id number] Yes Take by mouth. [provider] Taking Active   baclofen (LIORESAL) 10 MG tablet [id number] Yes Take by mouth. [provider] Taking Active   Ocie Bob AG HOME TEST KIT [id number] Yes Use as Directed on the Package [provider] Taking Active   clopidogrel (PLAVIX) 75 MG tablet [id number] Yes Take 1 tablet (75 mg total) by mouth daily with breakfast. [name], [name], MD Taking Active Self  diclofenac Sodium (VOLTAREN) 1 % GEL [id number] Yes Apply topically. [provider] Taking Active   Dulaglutide 1.5 MG/0.5ML SOPN [id number] Yes Inject 1.5 mg into the skin every Saturday.  [provider] Taking Active   DULoxetine (CYMBALTA) 20 MG capsule [id number] Yes Take 20 mg by mouth 2 (two) times daily. [provider] Taking Active   DULoxetine (CYMBALTA) 20 MG capsule [id number] Yes Take by mouth. [provider] Taking Active   ferrous sulfate 325 (65 FE) MG tablet [id number] Yes Take by mouth. [provider] Taking Active   gabapentin (NEURONTIN) 300 MG capsule [id number] Yes Take 300 mg by mouth 2 (two) times daily.  [provider] Taking Active Self  gabapentin (NEURONTIN) 300 MG capsule [id number] Yes Take by mouth. [provider] Taking Active   glucose monitoring kit (FREESTYLE) monitoring kit [id number] Yes 1 each by Does not apply route as needed for other. Joyce Cong,  MD Taking Active Self  insulin NPH-regular Human (NOVOLIN 70/30) (70-30) 100 UNIT/ML injection [id number] Yes Inject 30 Units into the skin 2 (two) times daily with a meal.  [provider] Taking Active Self  meloxicam (MOBIC) 15 MG tablet [id number] Yes Take 15 mg by mouth daily. [provider] Taking Active   metoCLOPramide (REGLAN) 5 MG tablet [id number] Yes  [provider] Taking Active    metoprolol tartrate (LOPRESSOR) 100 MG tablet [id number] Yes Take 100 mg by mouth daily. [provider] Taking Active Self  Multiple Vitamin (MULTI-VITAMIN) tablet [id number] Yes Take 1 tablet by mouth daily. [provider] Taking Active   [facility] VERIO test strip [id number] Yes SMARTSIG:Via Meter [provider] Taking Active   pantoprazole (PROTONIX) 40 MG tablet [id number] Yes Take by mouth. [provider] Taking Active   saccharomyces boulardii (FLORASTOR) 250 MG capsule [id number] Yes Take 250 mg by mouth 2 (two) times daily.  [provider] Taking Active Self  SPIRIVA HANDIHALER 18 MCG inhalation capsule [id number] Yes Place 18 mcg into inhaler and inhale daily in the afternoon.  [provider] Taking Active Self  traMADol (ULTRAM) 50 MG tablet [id number] Yes Take 50 mg by mouth every 6 (six) hours as needed for moderate pain.  [provider] Taking Active Self  Vitamin A 2400 MCG (8000 UT) CAPS [id number] Yes Take by mouth. [provider] Taking Active   Med List Note Joyce Potter, CPhT 05/20/20 1417): Patient stated she did not take her medications this morning as she had to report here at 0730             SDOH:  (Social Determinants of Health) assessments and interventions performed:  SDOH Interventions    Flowsheet Row Most Recent Value  SDOH Interventions   Food Insecurity Interventions Intervention Not Indicated  Financial Strain Interventions Intervention Not Indicated  Housing Interventions Intervention Not Indicated  Intimate Partner Violence Interventions Intervention Not Indicated  Stress Interventions Intervention Not Indicated  Social Connections Interventions Intervention Not Indicated  Transportation Interventions Intervention Not Indicated       Care Plan  Review of patient past medical history, allergies, medications, health status, including review of consultants reports,  laboratory and other test data, was performed as part of comprehensive evaluation for care management services.   Care Plan : Wellness (Adult)  Updates made by Barbaraann Faster, RN since 01/13/2022 12:00 AM  Completed 01/13/2022   Care Plan : Hypertension (Adult)  Updates made by Barbaraann Faster, RN since 01/13/2022 12:00 AM  Completed 01/13/2022   Care Plan : RN Care Manager Plan of Care  Updates made by Barbaraann Faster, RN since 01/13/2022 12:00 AM     Problem: Complex Care Coordination Needs and disease management in patient with Chronic Anemia, Diabetes, HTN, AVM   Priority: High     Long-Range Goal: Establish Plan of Care for Management Complex SDOH Barriers, Disease management and Care Coordination Needs in patient with Chronic anema, diabetes, AVM, HTN   Start Date: 10/30/2021  Recent Progress: On track  Priority: High  Note:   Current Barriers:  Knowledge Deficits related to plan of care for management of HTN, DMII, and chronic anemia, AVM Care Coordination needs related to Limited education about chronic anemia, diabetes, HTN and AVM* 11/18/21 Cares for ill spouse [name]  RNCM Clinical Goal(s):  Patient will verbalize understanding of plan for management of HTN, DMII, and chronic anemia, AVM  through collaboration with RN Care manager,  provider, and care team.   Interventions: Encouraged patient to outreach as needed to RN CM or 24 hr nurse line  Inter-disciplinary care team collaboration (see longitudinal plan of care) Evaluation of current treatment plan related to  self management and patient's adherence to plan as established by provider 12/16/21 Encouragement provided related to her management of diabetes and care of her spouse  Hypertension Interventions: Last practice recorded BP readings:  BP Readings from Last 3 Encounters:  10/20/21 (!) 154/61  08/21/21 136/72  08/19/21 (!) 156/67  Most recent eGFR/CrCl: No results found for: EGFR  No components found for:  CRCL  Evaluation of current treatment plan related to hypertension self management and patient's adherence to plan as established by provider; Reviewed medications with patient and discussed importance of compliance;  Diabetes Interventions: 12/16/21 recovering from sinus infection/cold with elevations in cbgs voiced concern with possible increase in HgA1c Assessed patient's understanding of A1c goal: <7% Provided education to patient about basic DM disease process; Reviewed medications with patient and discussed importance of medication adherence; Discussed plans with patient for ongoing care management follow up and provided patient with direct contact information for care management team; Lab Results  Component Value Date   HGBA1C 7.3 (H) 05/21/2020  Anemia/Bleeding Interventions:  Assessment of understanding of anemia/bleeding disorder diagnosis  Basic overview and discussion of anemia/bleeding disorder or acute disease state  Medications reviewed  Counseled on importance of regular laboratory monitoring as directed by provider; Provided education about signs and symptoms of active bleeding such as stomach discomfort, coughing up blood or blood tinged secretions, bleeding from the gums/teeth, nosebleeds, increased bruising, blood in the urine/stool and/or if a traumatic injury occurs, regardless of severity of injury;  Assessed social determinant of health barriers;  Lab Results  Component Value Date   WBC 4.2 10/19/2021   HGB 11.4 (L) 10/19/2021   HCT 35.6 (L) 10/19/2021   MCV 88.3 10/19/2021   PLT 234 10/19/2021   Patient Goals/Self-Care Activities: Patient will self administer medications as prescribed Patient will attend all scheduled provider appointments Patient will continue to perform ADL's independently Patient will continue to perform IADL's independently Patient will call provider office for new concerns or questions Patient will complete forms received for spouse  from Dr Ubaldo Glassing, attach the supporting documents she used early in 2022 for herself and request MD staff assistance with faxing the documents to the medication patient assistance company for a possible expedited response. She will outreach to RN CM for any further questions/assistance   Follow Up Plan:  The patient has been provided with contact information for the care management team and has been advised to call with any health related questions or concerns.  The care management team will reach out to the patient again over the next 30+ business days.       Plan: The patient has been provided with contact information for the care management team and has been advised to call with any health related questions or concerns.  The care management team will reach out to the patient again over the next 30 business days.  Jamiah Homeyer L. [name], RN, BSN, [place] Coordinator Office number [phone] Main [facility] number [phone] Fax number [phone]

## 2021-12-17 ENCOUNTER — Telehealth: Payer: Self-pay | Admitting: Pharmacy Technician

## 2021-12-17 DIAGNOSIS — Z596 Low income: Secondary | ICD-10-CM

## 2021-12-17 NOTE — Progress Notes (Signed)
[facility] [facility])                                            [place] Team    12/17/2021  Joyce Potter May 01, 1946 [id number]  Care coordination call placed to [facility] at [place] in regard to Humulin 35/52 and Trulicity applications.   Grayland Ormond informed patient was APPROVED 12/27/21-12/26/22. She informed medications would begin to ship again in January of 2023 based on last fill patient received in 2022. Medications will be delivered to the patient's home. She also informed that [facility] Leyden has a new pharmacy in 2023. The new pharmacy is called [facility] and their phone number is [phone].   Care coordination call placed to [employer] in regard to [facility] Handihaler application.   Spoke to Eric[place] who informed patient was APPROVED 12/27/21-12/26/22. She informed patient should call in for refills as needed in 2023 as she did previously in 2022. Medication will be delivered to patient's home.  [name] P. [name], [place]  [phone]

## 2021-12-22 DIAGNOSIS — E1169 Type 2 diabetes mellitus with other specified complication: Secondary | ICD-10-CM | POA: Diagnosis not present

## 2021-12-22 DIAGNOSIS — M810 Age-related osteoporosis without current pathological fracture: Secondary | ICD-10-CM | POA: Diagnosis not present

## 2021-12-22 DIAGNOSIS — E669 Obesity, unspecified: Secondary | ICD-10-CM | POA: Diagnosis not present

## 2021-12-22 DIAGNOSIS — Z794 Long term (current) use of insulin: Secondary | ICD-10-CM | POA: Diagnosis not present

## 2021-12-22 DIAGNOSIS — E1129 Type 2 diabetes mellitus with other diabetic kidney complication: Secondary | ICD-10-CM | POA: Diagnosis not present

## 2021-12-22 DIAGNOSIS — I10 Essential (primary) hypertension: Secondary | ICD-10-CM | POA: Diagnosis not present

## 2021-12-22 DIAGNOSIS — E1159 Type 2 diabetes mellitus with other circulatory complications: Secondary | ICD-10-CM | POA: Diagnosis not present

## 2021-12-22 DIAGNOSIS — R809 Proteinuria, unspecified: Secondary | ICD-10-CM | POA: Diagnosis not present

## 2021-12-22 DIAGNOSIS — E785 Hyperlipidemia, unspecified: Secondary | ICD-10-CM | POA: Diagnosis not present

## 2021-12-22 DIAGNOSIS — E1142 Type 2 diabetes mellitus with diabetic polyneuropathy: Secondary | ICD-10-CM | POA: Diagnosis not present

## 2021-12-23 DIAGNOSIS — E1165 Type 2 diabetes mellitus with hyperglycemia: Secondary | ICD-10-CM | POA: Diagnosis not present

## 2021-12-25 DIAGNOSIS — I10 Essential (primary) hypertension: Secondary | ICD-10-CM | POA: Diagnosis not present

## 2021-12-25 DIAGNOSIS — I2510 Atherosclerotic heart disease of native coronary artery without angina pectoris: Secondary | ICD-10-CM | POA: Diagnosis not present

## 2021-12-25 DIAGNOSIS — E782 Mixed hyperlipidemia: Secondary | ICD-10-CM | POA: Diagnosis not present

## 2021-12-25 DIAGNOSIS — G4733 Obstructive sleep apnea (adult) (pediatric): Secondary | ICD-10-CM | POA: Diagnosis not present

## 2021-12-25 DIAGNOSIS — E1165 Type 2 diabetes mellitus with hyperglycemia: Secondary | ICD-10-CM | POA: Diagnosis not present

## 2022-01-07 ENCOUNTER — Encounter: Payer: Self-pay | Admitting: Oncology

## 2022-01-11 ENCOUNTER — Inpatient Hospital Stay: Payer: Medicare PPO | Attending: Oncology

## 2022-01-11 ENCOUNTER — Other Ambulatory Visit: Payer: Self-pay

## 2022-01-11 ENCOUNTER — Other Ambulatory Visit: Payer: Self-pay | Admitting: Emergency Medicine

## 2022-01-11 ENCOUNTER — Other Ambulatory Visit: Payer: Self-pay | Admitting: *Deleted

## 2022-01-11 DIAGNOSIS — D509 Iron deficiency anemia, unspecified: Secondary | ICD-10-CM

## 2022-01-11 LAB — IRON AND TIBC
Iron: 27 ug/dL — ABNORMAL LOW (ref 28–170)
Saturation Ratios: 8 % — ABNORMAL LOW (ref 10.4–31.8)
TIBC: 333 ug/dL (ref 250–450)
UIBC: 306 ug/dL

## 2022-01-11 LAB — CBC
HCT: 31.3 % — ABNORMAL LOW (ref 36.0–46.0)
Hemoglobin: 10.2 g/dL — ABNORMAL LOW (ref 12.0–15.0)
MCH: 28.3 pg (ref 26.0–34.0)
MCHC: 32.6 g/dL (ref 30.0–36.0)
MCV: 86.7 fL (ref 80.0–100.0)
Platelets: 291 10*3/uL (ref 150–400)
RBC: 3.61 MIL/uL — ABNORMAL LOW (ref 3.87–5.11)
RDW: 14.3 % (ref 11.5–15.5)
WBC: 6.8 10*3/uL (ref 4.0–10.5)
nRBC: 0 % (ref 0.0–0.2)

## 2022-01-11 LAB — FERRITIN: Ferritin: 19 ng/mL (ref 11–307)

## 2022-01-12 NOTE — Progress Notes (Signed)
[place]  [phone] [phone]) [phone]  ID: Joyce Potter OB: 06-Mar-1946  MR#: [id number]  AOZ#:[id number]  Patient Care Team: [name], MD as PCP - General (Internal Medicine) Lloyd Huger, MD as Consulting Physician (Hematology and Oncology) Barbaraann Faster, RN as [place] Management Fath, Javier Docker, MD as Consulting Physician (Cardiology) Solum, Betsey Holiday, MD as Physician Assistant (Endocrinology) Virgel Manifold, MD (Inactive) as Consulting Physician (Gastroenterology) Jacquelin Hawking, NP as Nurse Practitioner (Oncology) Birder Robson, MD as Referring Physician (Ophthalmology)  CHIEF COMPLAINT: Iron deficiency anemia, secondary to chronic blood loss.  INTERVAL HISTORY: Patient returns to clinic today as an add-on for repeat laboratory, further evaluation, and consideration of additional Venofer.  Over the past 1 to 2 weeks she has noticed increased weakness and fatigue.  She also has new onset left flank pain in the last several days that she feels is associated with constipation.  She also had recent hematochezia.  She otherwise feels well.  She has no neurologic complaints.  She has a good appetite and denies weight loss.  She denies any recent fevers or illnesses.  She denies chest pain, shortness of breath, cough or hemoptysis. She denies any nausea, vomiting, or diarrhea.  She has no urinary complaints.  Patient offers no further specific complaints today.  REVIEW OF SYSTEMS:   Review of Systems  Constitutional:  Positive for malaise/fatigue. Negative for diaphoresis, fever and weight loss.  Eyes: Negative.  Negative for blurred vision.  Respiratory: Negative.  Negative for cough, hemoptysis, shortness of breath and wheezing.   Cardiovascular: Negative.  Negative for chest pain and leg swelling.  Gastrointestinal:  Positive for blood in stool. Negative for abdominal pain and melena.  Genitourinary:   Positive for flank pain. Negative for hematuria.  Musculoskeletal:  Negative for falls and joint pain.  Skin: Negative.  Negative for rash.  Neurological:  Positive for weakness. Negative for dizziness, focal weakness and headaches.  Psychiatric/Behavioral: Negative.  The patient is not nervous/anxious.    As per HPI. Otherwise, a complete review of systems is negative.  PAST MEDICAL HISTORY: Past Medical History:  Diagnosis Date   Anemia    iron deficiency   Aortic stenosis    Basal cell carcinoma    CAD (coronary artery disease)    s/p Left circumflex stent in 2012   Carotid stenosis    Cataract    High cholesterol    HTN (hypertension)    Hyperlipidemia    IDDM (insulin dependent diabetes mellitus)    Multiple thyroid nodules    Benign   Peptic ulcer disease    Retinal artery occlusion    on left    PAST SURGICAL HISTORY: Past Surgical History:  Procedure Laterality Date   ABDOMINAL HYSTERECTOMY     ARTERY BIOPSY Left 11/19/2015   Procedure: BIOPSY TEMPORAL ARTERY;  Surgeon: Algernon Huxley, MD;  Location: [facility] ORS;  Service: Vascular;  Laterality: Left;   BREAST BIOPSY Left 2002   core- neg   BREAST EXCISIONAL BIOPSY     ??? not visible scar on neither breast- pt believes it to be right breast    CARDIAC CATHETERIZATION N/A 08/08/2015   Procedure: Right and Left Heart Cath and Coronary Angiography;  Surgeon: Teodoro Spray, MD;  Location: [place] CV LAB;  Service: Cardiovascular;  Laterality: N/A;   CARDIAC CATHETERIZATION Bilateral 09/03/2016   Procedure: Right/Left Heart Cath and Coronary Angiography;  Surgeon: Teodoro Spray, MD;  Location: [place] [facility];  Service: Cardiovascular;  Laterality: Bilateral;   CATARACT EXTRACTION W/ INTRAOCULAR LENS  IMPLANT, BILATERAL     COLONOSCOPY     in 2013- internal hemorrhoids   COLONOSCOPY WITH PROPOFOL N/A 07/07/2018   Procedure: COLONOSCOPY WITH PROPOFOL;  Surgeon: Manya Silvas, MD;  Location: [facility] ENDOSCOPY;   Service: Endoscopy;  Laterality: N/A;   COLONOSCOPY WITH PROPOFOL N/A 05/22/2020   Procedure: COLONOSCOPY WITH PROPOFOL;  Surgeon: Jonathon Bellows, MD;  Location: [facility] ENDOSCOPY;  Service: Gastroenterology;  Laterality: N/A;   CORONARY ANGIOGRAPHY N/A 09/14/2017   Procedure: CORONARY ANGIOGRAPHY;  Surgeon: Teodoro Spray, MD;  Location: [place] [facility];  Service: Cardiovascular;  Laterality: N/A;   CORONARY ANGIOPLASTY     CORONARY STENT INTERVENTION N/A 05/15/2020   Procedure: coronary angiography;  Surgeon: Teodoro Spray, MD;  Location: [place] [facility];  Service: Cardiovascular;  Laterality: N/A;   CORONARY STENT INTERVENTION N/A 05/15/2020   Procedure: CORONARY STENT INTERVENTION;  Surgeon: Isaias Cowman, MD;  Location: [place] CV LAB;  Service: Cardiovascular;  Laterality: N/A;   CORONARY STENT PLACEMENT     ENDARTERECTOMY Left 10/01/2015   Procedure: ENDARTERECTOMY CAROTID;  Surgeon: Algernon Huxley, MD;  Location: [facility] ORS;  Service: Vascular;  Laterality: Left;   ENTEROSCOPY N/A 07/24/2020   Procedure: ENTEROSCOPY;  Surgeon: Virgel Manifold, MD;  Location: [facility] ENDOSCOPY;  Service: Endoscopy;  Laterality: N/A;   ESOPHAGOGASTRODUODENOSCOPY     ESOPHAGOGASTRODUODENOSCOPY (EGD) WITH PROPOFOL N/A 05/31/2016   Procedure: ESOPHAGOGASTRODUODENOSCOPY (EGD) WITH PROPOFOL;  Surgeon: Manya Silvas, MD;  Location: [facility] ENDOSCOPY;  Service: Endoscopy;  Laterality: N/A;   ESOPHAGOGASTRODUODENOSCOPY (EGD) WITH PROPOFOL N/A 07/07/2018   Procedure: ESOPHAGOGASTRODUODENOSCOPY (EGD) WITH PROPOFOL;  Surgeon: Manya Silvas, MD;  Location: [facility] ENDOSCOPY;  Service: Endoscopy;  Laterality: N/A;   ESOPHAGOGASTRODUODENOSCOPY (EGD) WITH PROPOFOL N/A 07/17/2018   Procedure: ESOPHAGOGASTRODUODENOSCOPY (EGD) WITH PROPOFOL;  Surgeon: Lucilla Lame, MD;  Location: [facility] ENDOSCOPY;  Service: Endoscopy;  Laterality: N/A;   ESOPHAGOGASTRODUODENOSCOPY (EGD) WITH PROPOFOL N/A 05/21/2020   Procedure:  ESOPHAGOGASTRODUODENOSCOPY (EGD) WITH PROPOFOL;  Surgeon: Jonathon Bellows, MD;  Location: [facility] ENDOSCOPY;  Service: Gastroenterology;  Laterality: N/A;   ESOPHAGOGASTRODUODENOSCOPY (EGD) WITH PROPOFOL N/A 06/13/2020   Procedure: ESOPHAGOGASTRODUODENOSCOPY (EGD) WITH PROPOFOL;  Surgeon: Lucilla Lame, MD;  Location: [facility] ENDOSCOPY;  Service: Endoscopy;  Laterality: N/A;   EYE SURGERY     GIVENS CAPSULE STUDY N/A 04/17/2013   Procedure: GIVENS CAPSULE STUDY;  Surgeon: Arta Silence, MD;  Location: [facility] END[facility]COPY;  Service: Endoscopy;  Laterality: N/A;  patient ate breakfast at [place] 06/11/2020   Procedure: GIVENS CAPSULE STUDY;  Surgeon: Virgel Manifold, MD;  Location: [facility] ENDOSCOPY;  Service: Endoscopy;  Laterality: N/A;   GIVENS CAPSULE STUDY N/A 01/29/2021   Procedure: GIVENS CAPSULE STUDY;  Surgeon: Virgel Manifold, MD;  Location: [facility] ENDOSCOPY;  Service: Endoscopy;  Laterality: N/A;   GIVENS CAPSULE STUDY N/A 08/24/2021   Procedure: GIVENS CAPSULE STUDY;  Surgeon: Virgel Manifold, MD;  Location: [facility] ENDOSCOPY;  Service: Endoscopy;  Laterality: N/A;   HEMORRHOID BANDING  07/27/2016   Dr Nicholes Stairs   PARTIAL HYSTERECTOMY     RIGHT AND LEFT HEART CATH Bilateral 09/14/2017   Procedure: RIGHT AND LEFT HEART CATH;  Surgeon: Teodoro Spray, MD;  Location: [place] CV LAB;  Service: Cardiovascular;  Laterality: Bilateral;   RIGHT/LEFT HEART CATH AND CORONARY ANGIOGRAPHY Right 05/15/2020   Procedure: Right/Left Heart cath;  Surgeon: Teodoro Spray, MD;  Location: [place] [facility] LAB;  Service: Cardiovascular;  Laterality: Right;   TRIGGER FINGER RELEASE      FAMILY HISTORY Family History  Problem Relation Age of Onset   Uterine cancer Mother    Breast cancer Mother 40   Seizures Father    Stroke Father    Diabetes Father    COPD Father    Colon cancer Neg Hx       ADVANCED DIRECTIVES:    HEALTH MAINTENANCE: Social History   Tobacco Use   Smoking  status: Former    Types: Cigarettes    Quit date: 02/15/1985    Years since quitting: 36.9   Smokeless tobacco: Never   Tobacco comments:    quit about 31 years ago  Vaping Use   Vaping Use: Never used  Substance Use Topics   Alcohol use: No    Alcohol/week: 0.0 standard drinks   Drug use: No    Colonoscopy:  PAP:  Bone density: Osteoporosis- DEXA 06/2017 (on fosamax)  Lipid panel:  Allergies  Allergen Reactions   Sulfa Antibiotics Other (See Comments)    Altered Mental Status   Invokamet [Canagliflozin-Metformin Hcl] Other (See Comments)    Yeast Infection   Lovastatin Rash   Penicillins Rash    Has patient had a PCN reaction causing immediate rash, facial/tongue/throat swelling, SOB or lightheadedness with hypotension:Yes Has patient had a PCN reaction causing severe rash involving mucus membranes or skin necrosis:all over body Has patient had a PCN reaction that required hospitalization: No Has patient had a PCN reaction occurring within the last 10 years: No If all of the above answers are "NO", then may proceed with Cephalosporin use.    Vicodin [Hydrocodone-Acetaminophen] Rash    Current Outpatient Medications  Medication Sig Dispense Refill   albuterol (VENTOLIN HFA) 108 (90 Base) MCG/ACT inhaler Inhale 2 puffs into the lungs every 4 (four) hours as needed for wheezing or shortness of breath.      alendronate (FOSAMAX) 70 MG tablet Take 70 mg by mouth once a week.      amitriptyline (ELAVIL) 25 MG tablet Take 25 mg by mouth at bedtime as needed for sleep.      aspirin 81 MG EC tablet Take by mouth.     aspirin EC 81 MG EC tablet Take 1 tablet (81 mg total) by mouth daily. Swallow whole. 30 tablet 11   atorvastatin (LIPITOR) 20 MG tablet Take by mouth.     baclofen (LIORESAL) 10 MG tablet Take by mouth.     BD INSULIN SYRINGE U/F 31G X 5/16" 1 ML MISC USE TWICE DAILY WITH INSULIN     clopidogrel (PLAVIX) 75 MG tablet Take 1 tablet (75 mg total) by mouth daily with  breakfast. 30 tablet 11   diclofenac Sodium (VOLTAREN) 1 % GEL Apply topically.     Dulaglutide 1.5 MG/0.5ML SOPN Inject 1.5 mg into the skin every Saturday.      DULoxetine (CYMBALTA) 20 MG capsule Take 20 mg by mouth 2 (two) times daily.     ferrous sulfate 325 (65 FE) MG tablet Take by mouth.     gabapentin (NEURONTIN) 300 MG capsule Take 300 mg by mouth 2 (two) times daily.      glucose monitoring kit (FREESTYLE) monitoring kit 1 each by Does not apply route as needed for other.     insulin NPH-regular Human (NOVOLIN 70/30) (70-30) 100 UNIT/ML injection Inject 30 Units into the skin 2 (two)  times daily with a meal.      Insulin Syringe-Needle U-100 (INSULIN SYRINGE 1CC/30GX5/16") 30G X 5/16" 1 ML MISC USE ONE TWICE DAILY WITH INSULIN     meloxicam (MOBIC) 15 MG tablet Take 15 mg by mouth daily.     metoprolol tartrate (LOPRESSOR) 100 MG tablet Take 100 mg by mouth daily.     Multiple Vitamin (MULTI-VITAMIN) tablet Take 1 tablet by mouth daily.     ONETOUCH VERIO test strip SMARTSIG:Via Meter     pantoprazole (PROTONIX) 40 MG tablet Take by mouth.     pantoprazole (PROTONIX) 40 MG tablet Take by mouth.     saccharomyces boulardii (FLORASTOR) 250 MG capsule Take 250 mg by mouth 2 (two) times daily.      SPIRIVA HANDIHALER 18 MCG inhalation capsule Place 18 mcg into inhaler and inhale daily in the afternoon.      traMADol (ULTRAM) 50 MG tablet Take 50 mg by mouth every 6 (six) hours as needed for moderate pain.      Vitamin A 2400 MCG (8000 UT) CAPS Take by mouth.     amitriptyline (ELAVIL) 25 MG tablet Take by mouth.     atorvastatin (LIPITOR) 20 MG tablet Take 20 mg by mouth daily.     DULoxetine (CYMBALTA) 20 MG capsule Take by mouth.     gabapentin (NEURONTIN) 300 MG capsule Take by mouth.     metoCLOPramide (REGLAN) 5 MG tablet  (Patient not taking: Reported on 01/14/2022)     No current facility-administered medications for this visit.   Facility-Administered Medications Ordered in  Other Visits  Medication Dose Route Frequency Provider Last Rate Last Admin   heparin lock flush 100 unit/mL  500 Units Intravenous Once Verlon Au, NP       sodium chloride flush (NS) 0.9 % injection 10 mL  10 mL Intravenous Once Verlon Au, NP        OBJECTIVE: Vitals:   01/14/22 1323  BP: (!) 158/69  Pulse: 84  Resp: 16  Temp: (!) 96.9 F (36.1 C)  SpO2: 100%     Body mass index is 35.02 kg/m.    ECOG FS:0 - Asymptomatic  General: Well-developed, well-nourished, no acute distress. Eyes: Pink conjunctiva, anicteric sclera. HEENT: Normocephalic, moist mucous membranes. Lungs: No audible wheezing or coughing. Heart: Regular rate and rhythm. Abdomen: Soft, nontender, no obvious distention. Musculoskeletal: No edema, cyanosis, or clubbing. Neuro: Alert, answering all questions appropriately. Cranial nerves grossly intact. Skin: No rashes or petechiae noted. Psych: Normal affect.  LAB RESULTS:  Lab Results  Component Value Date   NA 141 07/24/2020   K 4.1 07/24/2020   CL 104 07/24/2020   CO2 26 07/24/2020   GLUCOSE 102 (H) 07/24/2020   BUN 8 07/24/2020   CREATININE 0.79 07/24/2020   CALCIUM 8.5 (L) 07/24/2020   PROT 7.0 07/21/2020   ALBUMIN 3.8 07/21/2020   AST 18 07/21/2020   ALT 14 07/21/2020   ALKPHOS 72 07/21/2020   BILITOT 0.6 07/21/2020   GFRNONAA >60 07/24/2020   GFRAA >60 07/24/2020    Lab Results  Component Value Date   WBC 6.8 01/11/2022   NEUTROABS 2.1 10/19/2021   HGB 10.2 (L) 01/11/2022   HCT 31.3 (L) 01/11/2022   MCV 86.7 01/11/2022   PLT 291 01/11/2022   Lab Results  Component Value Date   IRON 27 (L) 01/11/2022   TIBC 333 01/11/2022   IRONPCTSAT 8 (L) 01/11/2022    Lab Results  Component Value Date  FERRITIN 19 01/11/2022     STUDIES: No results found.  ASSESSMENT: Iron deficiency anemia, secondary to chronic blood loss.  PLAN:    1. Iron deficiency anemia: Patient's hemoglobin and iron stores have trended down  and she is mildly symptomatic. Patient underwent capsule endoscopy in February 2022 that only revealed 1 nonbleeding lymphangiectasia.  Prior to that patient's most recent luminal evaluation was in July 2021. Bone marrow biopsy on February 15, 2017 did not reveal any significant pathology.  Proceed with 200 mg IV Venofer today.  Return to clinic twice next week for additional infusions.  Patient will then return to clinic in 3 months with repeat laboratory work, further evaluation, and consideration of additional treatment if needed.  2.  Melena/hematochezia/constipation/abdominal pain: Patient has been instructed to call GI symptoms persist. 3.  Hypertension: Chronic and unchanged.  Patient's blood pressure is moderately elevated today.  Continue evaluation and treatment per primary care.  I spent a total of 30 minutes reviewing chart data, face-to-face evaluation with the patient, counseling and coordination of care as detailed above.  Patient expressed understanding and was in agreement with this plan. She also understands that She can call clinic at any time with any questions, concerns, or complaints.    Lloyd Huger, MD 01/14/22 1:54 PM

## 2022-01-14 ENCOUNTER — Inpatient Hospital Stay: Payer: Medicare PPO

## 2022-01-14 ENCOUNTER — Inpatient Hospital Stay (HOSPITAL_BASED_OUTPATIENT_CLINIC_OR_DEPARTMENT_OTHER): Payer: Medicare PPO | Admitting: Oncology

## 2022-01-14 ENCOUNTER — Other Ambulatory Visit: Payer: Self-pay

## 2022-01-14 VITALS — BP 154/72 | HR 77 | Resp 19

## 2022-01-14 VITALS — BP 158/69 | HR 84 | Temp 96.9°F | Resp 16 | Wt 197.7 lb

## 2022-01-14 DIAGNOSIS — D509 Iron deficiency anemia, unspecified: Secondary | ICD-10-CM

## 2022-01-14 DIAGNOSIS — D500 Iron deficiency anemia secondary to blood loss (chronic): Secondary | ICD-10-CM

## 2022-01-14 MED ORDER — SODIUM CHLORIDE 0.9 % IV SOLN
Freq: Once | INTRAVENOUS | Status: AC
  Filled 2022-01-14: qty 250

## 2022-01-14 MED ORDER — SODIUM CHLORIDE 0.9 % IV SOLN: 200.0000 mg | Freq: Once | INTRAVENOUS | Status: DC

## 2022-01-14 MED ORDER — IRON SUCROSE 20 MG/ML IV SOLN
200.0000 mg | Freq: Once | INTRAVENOUS | Status: AC
  Administered 2022-01-14: 200 mg via INTRAVENOUS
  Filled 2022-01-14: qty 10

## 2022-01-14 NOTE — Patient Instructions (Signed)

## 2022-01-14 NOTE — Progress Notes (Signed)
Pt reports right sided abdomen/back pain she feels is related to constipation. Pt reports eating lots of cheese "probably more than I should but I also drink prune juice and miralax with a small amount of stool return."

## 2022-01-18 ENCOUNTER — Other Ambulatory Visit: Payer: Self-pay

## 2022-01-18 ENCOUNTER — Other Ambulatory Visit: Payer: Self-pay | Admitting: *Deleted

## 2022-01-18 NOTE — Patient Outreach (Signed)
[place] [facility]) Care Management Telephonic RN Care Manager Note   01/18/2022 Name:  Joyce Potter MRN:  [id number] DOB:  1946-10-15  Summary: Follow up outreach Joyce Potter reports she is doing fair today   She was recently seen on 01/16/22 [facility] ED & dx with acute cystitis without hematuria, constipation and is completing Keflex She continues to have trouble sleeping & back pain  She reports a 5 day period without a stool but the ED MRI negative for blockage/obstruction 01/16/22 S/s started with a warm pressured abdominal pain then bloody stool x 2 prior to ED Appetite good but still has not had a good formed stool only small pieces- taking miralax, prunes, fluids  01/14/22 Reported post side effects after last Venofer infusion - reports once she arrived home she experience itching and a rash that resolved within 24 hour  Reports her insurance was changed without her permission from [place] to [place] primary and [employer] blue shield secondary for 2023. "I was scammed" She outreached to [facility] to get this corrected and will be effective with [employer] on 01/27/22   Diabetes- Increase related recent infections - December 2022 sinusitis, January 2023 cystitis  Recommendations/Changes made from today's visit: See care plan below    Subjective: Joyce Potter is an 76 y.o. year old female who is a primary patient of [name], MD. The care management team was consulted for assistance with care management and/or care coordination needs.    Telephonic RN Care Manager completed Telephone Visit today.   Objective:  Medications Reviewed Today     Reviewed by [name], RN (Registered Nurse) on 01/14/22 at [name] Status: <None>   Medication Order Taking? Sig Documenting Provider Last Dose Status Informant  albuterol (VENTOLIN HFA) 108 (90 Base) MCG/ACT inhaler [id number] Yes Inhale 2 puffs into the lungs every 4 (four) hours as needed for wheezing or  shortness of breath.  [provider] Taking Active Self  alendronate (FOSAMAX) 70 MG tablet [id number] Yes Take 70 mg by mouth once a week.  [provider] Taking Active Self           Med Note [name]   Fri May 30, 2020  4:03 PM)    amitriptyline (ELAVIL) 25 MG tablet [id number] Yes Take 25 mg by mouth at bedtime as needed for sleep.  [provider] Taking Active Self  amitriptyline (ELAVIL) 25 MG tablet [id number]  Take by mouth. [provider]  Consider Medication Status and Discontinue (Duplicate)   aspirin 81 MG EC tablet [id number] Yes Take by mouth. [provider] Taking Consider Medication Status and Discontinue (Duplicate)   aspirin EC 81 MG EC tablet [id number] Yes Take 1 tablet (81 mg total) by mouth daily. Swallow whole. [name], MD Taking Active Self  atorvastatin (LIPITOR) 20 MG tablet [id number]  Take 20 mg by mouth daily. [provider]  Consider Medication Status and Discontinue (Duplicate)   atorvastatin (LIPITOR) 20 MG tablet [id number] Yes Take by mouth. [provider] Taking Active   baclofen (LIORESAL) 10 MG tablet [id number] Yes Take by mouth. [provider] Taking Active   BD INSULIN SYRINGE U/F 31G X 5/16" 1 ML MISC [id number] Yes USE TWICE DAILY WITH INSULIN [provider] Taking Active   clopidogrel (PLAVIX) 75 MG tablet [id number] Yes Take 1 tablet (75 mg total) by mouth daily with breakfast. [name], [name], MD Taking Active Self  diclofenac Sodium (VOLTAREN) 1 %  GEL [id number] Yes Apply topically. [provider] Taking Active   Dulaglutide 1.5 MG/0.5ML SOPN [id number] Yes Inject 1.5 mg into the skin every Saturday.  [provider] Taking Active   DULoxetine (CYMBALTA) 20 MG capsule [id number] Yes Take 20 mg by mouth 2 (two) times daily. [provider] Taking Active   DULoxetine (CYMBALTA) 20 MG capsule [id number]  Take by mouth. [provider]  Consider Medication Status and Discontinue (Duplicate)   ferrous sulfate 325 (65 FE) MG tablet [id number] Yes Take by mouth. [provider] Taking Active   gabapentin (NEURONTIN) 300 MG capsule [id number] Yes Take 300 mg by mouth 2 (two) times daily.  [provider] Taking Active Self  gabapentin (NEURONTIN) 300 MG capsule [id number]  Take by mouth. [provider]  Consider Medication Status and Discontinue (Duplicate)   glucose monitoring kit (FREESTYLE) monitoring kit [id number] Yes 1 each by Does not apply route as needed for other. [name], MD Taking Active Self  insulin NPH-regular Human (NOVOLIN 70/30) (70-30) 100 UNIT/ML injection [id number] Yes Inject 30 Units into the skin 2 (two) times daily with a meal.  [provider] Taking Active Self  Insulin Syringe-Needle U-100 (INSULIN SYRINGE 1CC/30GX5/16") 30G X 5/16" 1 ML MISC [id number] Yes USE ONE TWICE DAILY WITH INSULIN [provider] Taking Active   meloxicam (MOBIC) 15 MG tablet [id number] Yes Take 15 mg by mouth daily. [provider] Taking Active   metoCLOPramide (REGLAN) 5 MG tablet [id number] No   Patient not taking: Reported on 01/14/2022   [provider] Not Taking Active   metoprolol tartrate (LOPRESSOR) 100 MG tablet [id number] Yes Take 100 mg by mouth daily. [provider] Taking Active Self  Multiple Vitamin (MULTI-VITAMIN) tablet [id number] Yes Take 1 tablet by mouth daily. [provider] Taking Active   [facility] VERIO test strip [id number] Yes SMARTSIG:Via Meter [provider] Taking Active   pantoprazole (PROTONIX) 40 MG tablet [id number] Yes Take by mouth. [provider] Taking Active   pantoprazole (PROTONIX) 40 MG tablet [id number] Yes Take by mouth. [provider]  Consider Medication Status and Discontinue (Duplicate)   saccharomyces boulardii (FLORASTOR) 250 MG capsule [id number] Yes Take 250 mg  by mouth 2 (two) times daily.  [provider] Taking Active Self  SPIRIVA HANDIHALER 18 MCG inhalation capsule [id number] Yes Place 18 mcg into inhaler and inhale daily in the afternoon.  [provider] Taking Active Self  traMADol (ULTRAM) 50 MG tablet [id number] Yes Take 50 mg by mouth every 6 (six) hours as needed for moderate pain.  [provider] Taking Active Self  Vitamin A 2400 MCG (8000 UT) CAPS [id number] Yes Take by mouth. [provider] Taking Active   Med List Note [name], CPhT 05/20/20 1417): Patient stated she did not take her medications this morning as she had to report here at 0730           Patient Active Problem List   Diagnosis Date Noted   Pain in limb 02/20/2021   AVM (arteriovenous malformation) 01/09/2021   Aortic atherosclerosis ([place]) 08/12/2020   Anemia 07/22/2020   Acute on chronic anemia 07/21/2020   History of blood transfusion 06/27/2020   OSA (obstructive sleep apnea) 06/27/2020   GI bleeding 06/12/2020   Diabetes mellitus without complication ([place]) 07/86/7544   Anemia due to blood loss 05/20/2020   Blood loss anemia    Chronic fatigue 03/21/2020   Type 2 diabetes mellitus, with long-term  current use of insulin ([place]) 03/21/2020   Obesity, Class II, BMI 35-39.9 03/21/2020   Snoring 03/21/2020   Background diabetic retinopathy associated with type 2 diabetes mellitus ([place]) 06/06/2019   Uncontrolled type 2 diabetes mellitus with hyperglycemia ([place]) 04/30/2019   Osteoporosis, post-menopausal 04/30/2019   Multiple rib fractures 10/11/2018   GIB (gastrointestinal bleeding) 07/17/2018   Melena    Symptomatic anemia    PVD (peripheral vascular disease) ([place]) 02/11/2017   Iron deficiency anemia due to chronic blood loss 07/13/2016   Multinodular goiter 01/25/2016   Central retinal artery occlusion 10/01/2015   Carotid stenosis 10/01/2015   Stroke ([place]) 09/28/2015   Aortic stenosis 11/29/2014   PUD  (peptic ulcer disease) 07/17/2014   HLD (hyperlipidemia) 05/07/2013   GI bleed from an occult source with recurrent chronic blood loss anemia 04/14/2013   CAD (coronary artery disease) 04/14/2013   IDDM (insulin dependent diabetes mellitus) 04/14/2013   HTN (hypertension) 04/14/2013   Chest pain 04/14/2013     SDOH:  (Social Determinants of Health) assessments and interventions performed:    Care Plan  Review of patient past medical history, allergies, medications, health status, including review of consultants reports, laboratory and other test data, was performed as part of comprehensive evaluation for care management services.   There are no care plans that you recently modified to display for this patient.    Plan: The patient has been provided with contact information for the care management team and has been advised to call with any health related questions or concerns.  The care management team will reach out to the patient again over the next 30+ business days.  Glenden Rossell L. [name], RN, BSN, [place] Coordinator Office number [phone] [facility] number [phone] Fax number [phone]

## 2022-01-20 ENCOUNTER — Other Ambulatory Visit: Payer: Medicare HMO

## 2022-01-21 ENCOUNTER — Ambulatory Visit: Payer: Medicare HMO

## 2022-01-21 ENCOUNTER — Ambulatory Visit: Payer: Medicare HMO | Admitting: Oncology

## 2022-01-21 ENCOUNTER — Inpatient Hospital Stay: Payer: Medicare PPO

## 2022-01-21 ENCOUNTER — Other Ambulatory Visit: Payer: Self-pay

## 2022-01-21 VITALS — BP 132/47 | HR 63 | Temp 97.9°F | Resp 16

## 2022-01-21 DIAGNOSIS — D500 Iron deficiency anemia secondary to blood loss (chronic): Secondary | ICD-10-CM

## 2022-01-21 DIAGNOSIS — D509 Iron deficiency anemia, unspecified: Secondary | ICD-10-CM | POA: Diagnosis not present

## 2022-01-21 MED ORDER — SODIUM CHLORIDE 0.9 % IV SOLN
Freq: Once | INTRAVENOUS | Status: AC
  Filled 2022-01-21: qty 250

## 2022-01-21 MED ORDER — IRON SUCROSE 20 MG/ML IV SOLN
200.0000 mg | Freq: Once | INTRAVENOUS | Status: AC
Start: 2022-01-21 — End: 2022-01-21
  Administered 2022-01-21: 200 mg via INTRAVENOUS
  Filled 2022-01-21: qty 10

## 2022-01-21 MED ORDER — SODIUM CHLORIDE 0.9 % IV SOLN: 200.0000 mg | Freq: Once | INTRAVENOUS | Status: DC

## 2022-01-21 NOTE — Patient Instructions (Signed)

## 2022-01-25 ENCOUNTER — Other Ambulatory Visit: Payer: Self-pay

## 2022-01-25 ENCOUNTER — Inpatient Hospital Stay: Payer: Medicare PPO

## 2022-01-25 VITALS — BP 136/49 | HR 66 | Temp 97.8°F | Resp 16

## 2022-01-25 DIAGNOSIS — D500 Iron deficiency anemia secondary to blood loss (chronic): Secondary | ICD-10-CM

## 2022-01-25 DIAGNOSIS — D509 Iron deficiency anemia, unspecified: Secondary | ICD-10-CM | POA: Diagnosis not present

## 2022-01-25 MED ORDER — SODIUM CHLORIDE 0.9 % IV SOLN
INTRAVENOUS | Status: DC | PRN
  Filled 2022-01-25: qty 250

## 2022-01-25 MED ORDER — IRON SUCROSE 20 MG/ML IV SOLN
200.0000 mg | Freq: Once | INTRAVENOUS | Status: AC
  Administered 2022-01-25: 200 mg via INTRAVENOUS
  Filled 2022-01-25: qty 10

## 2022-01-25 MED ORDER — IRON SUCROSE 20 MG/ML IV SOLN
200.0000 mg | Freq: Once | INTRAVENOUS | Status: DC
Start: 2022-01-25 — End: 2022-01-25

## 2022-01-27 ENCOUNTER — Encounter: Payer: Self-pay | Admitting: Oncology

## 2022-01-27 DIAGNOSIS — E1165 Type 2 diabetes mellitus with hyperglycemia: Secondary | ICD-10-CM | POA: Diagnosis not present

## 2022-02-11 DIAGNOSIS — M25571 Pain in right ankle and joints of right foot: Secondary | ICD-10-CM | POA: Diagnosis not present

## 2022-02-11 DIAGNOSIS — M7989 Other specified soft tissue disorders: Secondary | ICD-10-CM | POA: Diagnosis not present

## 2022-02-11 DIAGNOSIS — M84374A Stress fracture, right foot, initial encounter for fracture: Secondary | ICD-10-CM | POA: Diagnosis not present

## 2022-02-11 DIAGNOSIS — M19071 Primary osteoarthritis, right ankle and foot: Secondary | ICD-10-CM | POA: Diagnosis not present

## 2022-02-18 ENCOUNTER — Other Ambulatory Visit: Payer: Self-pay | Admitting: *Deleted

## 2022-02-18 ENCOUNTER — Encounter: Payer: Self-pay | Admitting: *Deleted

## 2022-02-18 NOTE — Patient Outreach (Signed)
[facility]) Care Management Telephonic RN Care Manager Note   02/18/2022 Name:  Joyce Potter MRN:  [id number] DOB:  [birth year]  Summary: Follow up outreach Joyce Potter reports she is doing well  She reports recovery from cystitis and improvements with anemia, DM She reports she and husband [name] are out in the community completing errands today and enjoying the sunshine She reports also improvements with Mr John's oncology platelet status  Joyce Potter denies care coordination and disease management needs today Joyce Viele does inquire during [facility] progression discussion about possible conflict with an external quarterly visit from a nurse staff from her insurance carrier. She does not have all the details at the time of this outreach but will return a call to RN CM so RN CM can check for her     Recommendations/Changes made from today's visit: Assessed for worsening symptoms, care coordination and disease management needs  Attempted to answer questions for her related to her inquiry about a possible external care management program, encouraged to provide more details to RN CM for verification    Subjective: Joyce Potter is an 76 y.o. year old female who is a primary patient of [name], MD. The care management team was consulted for assistance with care management and/or care coordination needs.    Telephonic RN Care Manager completed Telephone Visit today.   Objective:  Medications Reviewed Today     Reviewed by [name], RN (Registered Nurse) on 01/14/22 at [place] List Status: <None>   Medication Order Taking? Sig Documenting Provider Last Dose Status Informant  albuterol (VENTOLIN HFA) 108 (90 Base) MCG/ACT inhaler [id number] Yes Inhale 2 puffs into the lungs every 4 (four) hours as needed for wheezing or shortness of breath.  [provider] Taking Active Self  alendronate (FOSAMAX) 70 MG tablet [id number] Yes Take 70 mg by mouth  once a week.  [provider] Taking Active Self           Med Note [name]   Fri May 30, 2020  4:03 PM)    amitriptyline (ELAVIL) 25 MG tablet [id number] Yes Take 25 mg by mouth at bedtime as needed for sleep.  [provider] Taking Active Self  amitriptyline (ELAVIL) 25 MG tablet [id number]  Take by mouth. [provider]  Consider Medication Status and Discontinue (Duplicate)   aspirin 81 MG EC tablet [id number] Yes Take by mouth. [provider] Taking Consider Medication Status and Discontinue (Duplicate)   aspirin EC 81 MG EC tablet [id number] Yes Take 1 tablet (81 mg total) by mouth daily. Swallow whole. [name], MD Taking Active Self  atorvastatin (LIPITOR) 20 MG tablet [id number]  Take 20 mg by mouth daily. [provider]  Consider Medication Status and Discontinue (Duplicate)   atorvastatin (LIPITOR) 20 MG tablet [id number] Yes Take by mouth. [provider] Taking Active   baclofen (LIORESAL) 10 MG tablet [id number] Yes Take by mouth. [provider] Taking Active   BD INSULIN SYRINGE U/F 31G X 5/16" 1 ML MISC [id number] Yes USE TWICE DAILY WITH INSULIN [provider] Taking Active   clopidogrel (PLAVIX) 75 MG tablet [id number] Yes Take 1 tablet (75 mg total) by mouth daily with breakfast. [name], [name], MD Taking Active Self  diclofenac Sodium (VOLTAREN) 1 % GEL [id number] Yes Apply topically. [provider] Taking Active   Dulaglutide 1.5 MG/0.5ML SOPN [id number] Yes Inject 1.5 mg into the skin every Saturday.  [provider] Taking Active   DULoxetine (CYMBALTA) 20 MG capsule [id number] Yes Take 20 mg by mouth 2 (two) times daily. [provider] Taking Active   DULoxetine (CYMBALTA) 20 MG capsule [id number]  Take by mouth. [provider]  Consider Medication Status and Discontinue (Duplicate)   ferrous sulfate 325 (65 FE) MG tablet [id number] Yes Take by mouth.  [provider] Taking Active   gabapentin (NEURONTIN) 300 MG capsule [id number] Yes Take 300 mg by mouth 2 (two) times daily.  [provider] Taking Active Self  gabapentin (NEURONTIN) 300 MG capsule [id number]  Take by mouth. [provider]  Consider Medication Status and Discontinue (Duplicate)   glucose monitoring kit (FREESTYLE) monitoring kit [id number] Yes 1 each by Does not apply route as needed for other. [name], MD Taking Active Self  insulin NPH-regular Human (NOVOLIN 70/30) (70-30) 100 UNIT/ML injection [id number] Yes Inject 30 Units into the skin 2 (two) times daily with a meal.  [provider] Taking Active Self  Insulin Syringe-Needle U-100 (INSULIN SYRINGE 1CC/30GX5/16") 30G X 5/16" 1 ML MISC [id number] Yes USE ONE TWICE DAILY WITH INSULIN [provider] Taking Active   meloxicam (MOBIC) 15 MG tablet [id number] Yes Take 15 mg by mouth daily. [provider] Taking Active   metoCLOPramide (REGLAN) 5 MG tablet [id number] No   Patient not taking: Reported on 01/14/2022   [provider] Not Taking Active   metoprolol tartrate (LOPRESSOR) 100 MG tablet [id number] Yes Take 100 mg by mouth daily. [provider] Taking Active Self  Multiple Vitamin (MULTI-VITAMIN) tablet [id number] Yes Take 1 tablet by mouth daily. [provider] Taking Active   [facility] VERIO test strip [id number] Yes SMARTSIG:Via Meter [provider] Taking Active   pantoprazole (PROTONIX) 40 MG tablet [id number] Yes Take by mouth. [provider] Taking Active   pantoprazole (PROTONIX) 40 MG tablet [id number] Yes Take by mouth. [provider]  Consider Medication Status and Discontinue (Duplicate)   saccharomyces boulardii (FLORASTOR) 250 MG capsule [id number] Yes Take 250 mg by mouth 2 (two) times daily.  [provider] Taking Active Self  SPIRIVA HANDIHALER 18 MCG inhalation capsule [id number] Yes Place  18 mcg into inhaler and inhale daily in the afternoon.  [provider] Taking Active Self  traMADol (ULTRAM) 50 MG tablet [id number] Yes Take 50 mg by mouth every 6 (six) hours as needed for moderate pain.  [provider] Taking Active Self  Vitamin A 2400 MCG (8000 UT) CAPS [id number] Yes Take by mouth. [provider] Taking Active   Med List Note [name], CPhT 05/20/20 1417): Patient stated she did not take her medications this morning as she had to report here at 0730             SDOH:  (Social Determinants of Health) assessments and interventions performed:  SDOH Interventions    Flowsheet Row Most Recent Value  SDOH Interventions   Food Insecurity Interventions Intervention Not Indicated  Stress Interventions Intervention Not Indicated  Transportation Interventions Intervention Not Indicated       Care Plan  Review of patient past medical history, allergies, medications, health status, including review of consultants reports, laboratory and other test data, was performed as part of comprehensive evaluation for care management services.   Care Plan : RN Care Manager Plan of Care  Updates made by Barbaraann Faster, RN since 02/18/2022 12:00 AM     Problem: Complex Care Coordination Needs and  disease management in patient with Chronic Anemia, Diabetes, HTN, AVM   Priority: High     Long-Range Goal: Establish Plan of Care for Management Complex SDOH Barriers, Disease management and Care Coordination Needs in patient with Chronic anema, diabetes, AVM, HTN   Start Date: 10/30/2021  This Visit's Progress: On track  Recent Progress: On track  Priority: High  Note:   Current Barriers:  Knowledge Deficits related to plan of care for management of HTN, DMII, and chronic anemia, AVM Care Coordination needs related to Limited education about chronic anemia, diabetes, HTN and AVM* 11/18/21 Cares for ill spouse [name] 01/18/22 December 2022  sinusitis, January 2023 cystitis 01/16/22 [facility] ED & dx with acute cystitis, elevation of HgA1c from 5.2 to 7.9    RN CM Clinical Goal(s):  Patient will verbalize understanding of plan for management of HTN, DMII, and chronic anemia, AVM  through collaboration with RN Care manager, provider, and care team.   Interventions: Encouraged patient to outreach as needed to RN CM or 24 hr nurse line  Inter-disciplinary care team collaboration (see longitudinal plan of care) Evaluation of current treatment plan related to  self management and patient's adherence to plan as established by provider 12/16/21 Encouragement provided related to her management of diabetes and care of her spouse 01/18/22 Iron sucrose/Venofer infusions reviewed for 08/19/21, 08/21/21 & 01/14/22 for the type medicine documented or how administer per pt inquiry Reviewed standard Rights of medication administration  Reviewed the side effects of Venofer (Muscle cramps, nausea, vomiting, strange taste in the mouth, diarrhea, constipation, headache, cough, back pain, joint pain, dizziness, or swelling of the arms/legs may occur. Pain, swelling, or redness at the injection site may occur.) Encouraged her to always call to MD office as soon as possible with any abnormal reactions symptoms Questions answered Reviewed her urinalysis Discussed THN progression   Hypertension Interventions: Goal on track yes- BP improvements Last practice recorded BP readings:  BP Readings from Last 3 Encounters:  01/25/22 (!) 136/49  01/21/22 (!) 132/47  01/14/22 (!) 154/72  Most recent eGFR/CrCl: No results found for: EGFR  No components found for: CRCL  Evaluation of current treatment plan related to hypertension self management and patient's adherence to plan as established by provider; Reviewed medications with patient and discussed importance of compliance;  Diabetes Interventions: goal on track Yes Long term goal recovered from acute  cystitis 12/16/21 recovering from sinus infection/cold with elevations in cbgs voiced concern with possible increase in HgA1c Assessed patient's understanding of A1c goal: <7% Provided education to patient about basic DM disease process; Reviewed medications with patient and discussed importance of medication adherence; Discussed plans with patient for ongoing care management follow up and provided patient with direct contact information for care management team; HgA1c 7.9 on 12/15/21, was 5.2 on 08/13/21 per Care everywhere Lab Results  Component Value Date   HGBA1C 7.3 (H) 05/21/2020  Anemia/Bleeding Interventions: goal on track yes Short term goal  Assessment of understanding of anemia/bleeding disorder diagnosis  Basic overview and discussion of anemia/bleeding disorder or acute disease state  Medications reviewed  Counseled on importance of regular laboratory monitoring as directed by provider Provided education about signs and symptoms of active bleeding such as stomach discomfort, coughing up blood or blood tinged secretions, bleeding from the gums/teeth, nosebleeds, increased bruising, blood in the urine/stool and/or if a traumatic injury occurs, regardless of severity of injury  recommended promotion of rest and energy-conserving measures to manage fatigue, such as balancing activity with periods of  rest Assessed social determinant of health barriers  Lab Results  Component Value Date   WBC 6.8 01/11/2022   HGB 10.2 (L) 01/11/2022   HCT 31.3 (L) 01/11/2022   MCV 86.7 01/11/2022   PLT 291 01/11/2022   Patient Goals/Self-Care Activities: Patient will self administer medications as prescribed Patient will attend all scheduled provider appointments Patient will continue to perform ADL's independently Patient will continue to perform IADL's independently Patient will call provider office for new concerns or questions    Follow Up Plan:  The patient has been provided with contact  information for the care management team and has been advised to call with any health related questions or concerns.  The care management team will reach out to the patient again over the next 30+ business days.         Kaidan Spengler L. [name], RN, BSN, [place] Coordinator Office number [phone] Main [facility] number [phone] Fax number [phone]

## 2022-02-19 ENCOUNTER — Ambulatory Visit (INDEPENDENT_AMBULATORY_CARE_PROVIDER_SITE_OTHER): Payer: Medicare HMO | Admitting: Vascular Surgery

## 2022-02-19 ENCOUNTER — Other Ambulatory Visit (INDEPENDENT_AMBULATORY_CARE_PROVIDER_SITE_OTHER): Payer: Self-pay | Admitting: Vascular Surgery

## 2022-02-19 ENCOUNTER — Ambulatory Visit (INDEPENDENT_AMBULATORY_CARE_PROVIDER_SITE_OTHER): Payer: Medicare HMO

## 2022-02-19 ENCOUNTER — Other Ambulatory Visit: Payer: Self-pay

## 2022-02-19 VITALS — BP 164/68 | HR 78 | Ht 63.0 in | Wt 197.0 lb

## 2022-02-19 DIAGNOSIS — I6523 Occlusion and stenosis of bilateral carotid arteries: Secondary | ICD-10-CM | POA: Diagnosis not present

## 2022-02-19 DIAGNOSIS — I739 Peripheral vascular disease, unspecified: Secondary | ICD-10-CM | POA: Diagnosis not present

## 2022-02-19 DIAGNOSIS — E1165 Type 2 diabetes mellitus with hyperglycemia: Secondary | ICD-10-CM | POA: Diagnosis not present

## 2022-02-19 DIAGNOSIS — I10 Essential (primary) hypertension: Secondary | ICD-10-CM | POA: Diagnosis not present

## 2022-02-19 DIAGNOSIS — M79604 Pain in right leg: Secondary | ICD-10-CM

## 2022-02-19 DIAGNOSIS — M79605 Pain in left leg: Secondary | ICD-10-CM

## 2022-02-19 NOTE — Progress Notes (Signed)
MRN : [id number]  Joyce Potter is a 76 y.o. ([birth year]) female who presents with chief complaint of  Chief Complaint  Patient presents with   Follow-up    1 yr carotid  .  History of Present Illness: Patient returns in follow-up of multiple vascular issues.  She continues to have some pain as well as intermittent swelling particular in the right foot and ankle.  She also has knee arthritis.  Her ABIs today are 1.11 on the right and 1.09 on the left with biphasic waveforms and normal digital pressures bilaterally. She is also followed for her carotid disease.  She is doing well without focal neurologic symptoms.  She is many years status post left carotid endarterectomy. Carotid duplex today shows 1 to 39% stenosis in the right carotid artery and a widely patent left carotid endarterectomy.  Current Outpatient Medications  Medication Sig Dispense Refill   albuterol (VENTOLIN HFA) 108 (90 Base) MCG/ACT inhaler Inhale 2 puffs into the lungs every 4 (four) hours as needed for wheezing or shortness of breath.      alendronate (FOSAMAX) 70 MG tablet Take 70 mg by mouth once a week.      amitriptyline (ELAVIL) 25 MG tablet Take 25 mg by mouth at bedtime as needed for sleep.      amitriptyline (ELAVIL) 25 MG tablet Take by mouth.     aspirin 81 MG EC tablet Take by mouth.     aspirin EC 81 MG EC tablet Take 1 tablet (81 mg total) by mouth daily. Swallow whole. 30 tablet 11   atorvastatin (LIPITOR) 20 MG tablet Take 20 mg by mouth daily.     atorvastatin (LIPITOR) 20 MG tablet Take by mouth.     azithromycin (ZITHROMAX) 250 MG tablet Take by mouth.     baclofen (LIORESAL) 10 MG tablet Take by mouth.     BD INSULIN SYRINGE U/F 31G X 5/16" 1 ML MISC USE TWICE DAILY WITH INSULIN     clopidogrel (PLAVIX) 75 MG tablet Take 1 tablet (75 mg total) by mouth daily with breakfast. 30 tablet 11   diclofenac Sodium (VOLTAREN) 1 % GEL Apply topically.     Dulaglutide 1.5 MG/0.5ML SOPN Inject 1.5 mg  into the skin every Saturday.      DULoxetine (CYMBALTA) 20 MG capsule Take 20 mg by mouth 2 (two) times daily.     DULoxetine (CYMBALTA) 20 MG capsule Take by mouth.     ferrous sulfate 325 (65 FE) MG tablet Take by mouth.     gabapentin (NEURONTIN) 300 MG capsule Take 300 mg by mouth 2 (two) times daily.      gabapentin (NEURONTIN) 300 MG capsule Take by mouth.     insulin NPH-regular Human (NOVOLIN 70/30) (70-30) 100 UNIT/ML injection Inject 30 Units into the skin 2 (two) times daily with a meal.      Insulin Syringe-Needle U-100 (INSULIN SYRINGE 1CC/30GX5/16") 30G X 5/16" 1 ML MISC USE ONE TWICE DAILY WITH INSULIN     meloxicam (MOBIC) 15 MG tablet Take 15 mg by mouth daily.     metoCLOPramide (REGLAN) 5 MG tablet      metoprolol tartrate (LOPRESSOR) 100 MG tablet Take 100 mg by mouth daily.     Multiple Vitamin (MULTI-VITAMIN) tablet Take 1 tablet by mouth daily.     ONETOUCH VERIO test strip SMARTSIG:Via Meter     pantoprazole (PROTONIX) 40 MG tablet Take by mouth.     pantoprazole (PROTONIX) 40 MG tablet  Take by mouth.     saccharomyces boulardii (FLORASTOR) 250 MG capsule Take 250 mg by mouth 2 (two) times daily.      SPIRIVA HANDIHALER 18 MCG inhalation capsule Place 18 mcg into inhaler and inhale daily in the afternoon.      traMADol (ULTRAM) 50 MG tablet Take 50 mg by mouth every 6 (six) hours as needed for moderate pain.      Vitamin A 2400 MCG (8000 UT) CAPS Take by mouth.     glucose monitoring kit (FREESTYLE) monitoring kit 1 each by Does not apply route as needed for other.     No current facility-administered medications for this visit.   Facility-Administered Medications Ordered in Other Visits  Medication Dose Route Frequency Provider Last Rate Last Admin   heparin lock flush 100 unit/mL  500 Units Intravenous Once Verlon Au, NP       sodium chloride flush (NS) 0.9 % injection 10 mL  10 mL Intravenous Once Verlon Au, NP        Past Medical History:   Diagnosis Date   Anemia    iron deficiency   Aortic stenosis    Basal cell carcinoma    CAD (coronary artery disease)    s/p Left circumflex stent in 2012   Carotid stenosis    Cataract    High cholesterol    HTN (hypertension)    Hyperlipidemia    IDDM (insulin dependent diabetes mellitus)    Multiple thyroid nodules    Benign   Peptic ulcer disease    Retinal artery occlusion    on left    Past Surgical History:  Procedure Laterality Date   ABDOMINAL HYSTERECTOMY     ARTERY BIOPSY Left 11/19/2015   Procedure: BIOPSY TEMPORAL ARTERY;  Surgeon: Algernon Huxley, MD;  Location: [facility];  Service: Vascular;  Laterality: Left;   BREAST BIOPSY Left 2002   core- neg   BREAST EXCISIONAL BIOPSY     ??? not visible scar on neither breast- pt believes it to be right breast    CARDIAC CATHETERIZATION N/A 08/08/2015   Procedure: Right and Left Heart Cath and Coronary Angiography;  Surgeon: Teodoro Spray, MD;  Location: [place] [facility];  Service: Cardiovascular;  Laterality: N/A;   CARDIAC CATHETERIZATION Bilateral 09/03/2016   Procedure: Right/Left Heart Cath and Coronary Angiography;  Surgeon: Teodoro Spray, MD;  Location: [place] [facility];  Service: Cardiovascular;  Laterality: Bilateral;   CATARACT EXTRACTION W/ INTRAOCULAR LENS  IMPLANT, BILATERAL     COLONOSCOPY     in 2013- internal hemorrhoids   COLONOSCOPY WITH PROPOFOL N/A 07/07/2018   Procedure: COLONOSCOPY WITH PROPOFOL;  Surgeon: Manya Silvas, MD;  Location[facility];  Service: Endoscopy;  Laterality: N/A;   COLONOSCOPY WITH PROPOFOL N/A 05/22/2020   Procedure: COLONOSCOPY WITH PROPOFOL;  Surgeon: Jonathon Bellows, MD;  Location: [facility] ENDOSCOPY;  Service: Gastroenterology;  Laterality: N/A;   CORONARY ANGIOGRAPHY N/A 09/14/2017   Procedure: CORONARY ANGIOGRAPHY;  Surgeon: Teodoro Spray, MD;  Location: [place] [facility];  Service: Cardiovascular;  Laterality: N/A;   CORONARY ANGIOPLASTY     CORONARY STENT  INTERVENTION N/A 05/15/2020   Procedure: coronary angiography;  Surgeon: Teodoro Spray, MD;  Location: [place] [facility];  Service: Cardiovascular;  Laterality: N/A;   CORONARY STENT INTERVENTION N/A 05/15/2020   Procedure: CORONARY STENT INTERVENTION;  Surgeon: Isaias Cowman, MD;  Location: [place] [facility];  Service: Cardiovascular;  Laterality: N/A;  CORONARY STENT PLACEMENT     ENDARTERECTOMY Left 10/01/2015   Procedure: ENDARTERECTOMY CAROTID;  Surgeon: Algernon Huxley, MD;  Location: [facility];  Service: Vascular;  Laterality: Left;   ENTEROSCOPY N/A 07/24/2020   Procedure: ENTEROSCOPY;  Surgeon: Virgel Manifold, MD;  Location: [facility] ENDOSCOPY;  Service: Endoscopy;  Laterality: N/A;   ESOPHAGOGASTRODUODENOSCOPY     ESOPHAGOGASTRODUODENOSCOPY (EGD) WITH PROPOFOL N/A 05/31/2016   Procedure: ESOPHAGOGASTRODUODENOSCOPY (EGD) WITH PROPOFOL;  Surgeon: Manya Silvas, MD;  Location: [facility] ENDOSCOPY;  Service: Endoscopy;  Laterality: N/A;   ESOPHAGOGASTRODUODENOSCOPY (EGD) WITH PROPOFOL N/A 07/07/2018   Procedure: ESOPHAGOGASTRODUODENOSCOPY (EGD) WITH PROPOFOL;  Surgeon: Manya Silvas, MD;  Location: [facility] ENDOSCOPY;  Service: Endoscopy;  Laterality: N/A;   ESOPHAGOGASTRODUODENOSCOPY (EGD) WITH PROPOFOL N/A 07/17/2018   Procedure: ESOPHAGOGASTRODUODENOSCOPY (EGD) WITH PROPOFOL;  Surgeon: Lucilla Lame, MD;  Location: [facility] ENDOSCOPY;  Service: Endoscopy;  Laterality: N/A;   ESOPHAGOGASTRODUODENOSCOPY (EGD) WITH PROPOFOL N/A 05/21/2020   Procedure: ESOPHAGOGASTRODUODENOSCOPY (EGD) WITH PROPOFOL;  Surgeon: Jonathon Bellows, MD;  Location: [facility] ENDOSCOPY;  Service: Gastroenterology;  Laterality: N/A;   ESOPHAGOGASTRODUODENOSCOPY (EGD) WITH PROPOFOL N/A 06/13/2020   Procedure: ESOPHAGOGASTRODUODENOSCOPY (EGD) WITH PROPOFOL;  Surgeon: Lucilla Lame, MD;  Location: [facility] ENDOSCOPY;  Service: Endoscopy;  Laterality: N/A;   EYE SURGERY     GIVENS CAPSULE STUDY N/A 04/17/2013   Procedure: GIVENS CAPSULE  STUDY;  Surgeon: Arta Silence, MD;  Location: [facility] ENDOSCOPY;  Service: Endoscopy;  Laterality: N/A;  patient ate breakfast at [place] 06/11/2020   Procedure: GIVENS CAPSULE STUDY;  Surgeon: Virgel Manifold, MD;  Location: [facility] ENDOSCOPY;  Service: Endoscopy;  Laterality: N/A;   GIVENS CAPSULE STUDY N/A 01/29/2021   Procedure: GIVENS CAPSULE STUDY;  Surgeon: Virgel Manifold, MD;  Location: [facility] ENDOSCOPY;  Service: Endoscopy;  Laterality: N/A;   GIVENS CAPSULE STUDY N/A 08/24/2021   Procedure: GIVENS CAPSULE STUDY;  Surgeon: Virgel Manifold, MD;  Location: [facility] ENDOSCOPY;  Service: Endoscopy;  Laterality: N/A;   HEMORRHOID BANDING  07/27/2016   Dr Nicholes Stairs   PARTIAL HYSTERECTOMY     RIGHT AND LEFT HEART CATH Bilateral 09/14/2017   Procedure: RIGHT AND LEFT HEART CATH;  Surgeon: Teodoro Spray, MD;  Location: [place] [facility];  Service: Cardiovascular;  Laterality: Bilateral;   RIGHT/LEFT HEART CATH AND CORONARY ANGIOGRAPHY Right 05/15/2020   Procedure: Right/Left Heart cath;  Surgeon: Teodoro Spray, MD;  Location: [place] [facility];  Service: Cardiovascular;  Laterality: Right;   TRIGGER FINGER RELEASE       Social History   Tobacco Use   Smoking status: Former    Types: Cigarettes    Quit date: 02/15/1985    Years since quitting: 37.0   Smokeless tobacco: Never   Tobacco comments:    quit about 31 years ago  Vaping Use   Vaping Use: Never used  Substance Use Topics   Alcohol use: No    Alcohol/week: 0.0 standard drinks   Drug use: No       Family History  Problem Relation Age of Onset   Uterine cancer Mother    Breast cancer Mother 50   Seizures Father    Stroke Father    Diabetes Father    COPD Father    Colon cancer Neg Hx      Allergies  Allergen Reactions   Sulfa Antibiotics Other (See Comments)    Altered Mental Status   Invokamet [Canagliflozin-Metformin Hcl] Other (See Comments)  Yeast Infection   Lovastatin  Rash   Penicillins Rash    Has patient had a PCN reaction causing immediate rash, facial/tongue/throat swelling, SOB or lightheadedness with hypotension:Yes Has patient had a PCN reaction causing severe rash involving mucus membranes or skin necrosis:all over body Has patient had a PCN reaction that required hospitalization: No Has patient had a PCN reaction occurring within the last 10 years: No If all of the above answers are "NO", then may proceed with Cephalosporin use.    Vicodin [Hydrocodone-Acetaminophen] Rash    REVIEW OF SYSTEMS (Negative unless checked)   Constitutional: _0 Weight loss  _1 Fever  _2 Chills Cardiac: _3 Chest pain   _4 Chest pressure   _5 Palpitations   _6 Shortness of breath when laying flat   _7 Shortness of breath at rest   _8 Shortness of breath with exertion. Vascular:  _9 Pain in legs with walking   _10 Pain in legs at rest   _11 Pain in legs when laying flat   _12 Claudication   _13 Pain in feet when walking  _14 Pain in feet at rest  _15 Pain in feet when laying flat   _16 History of DVT   _17 Phlebitis   _18 Swelling in legs   _19 Varicose veins   _20 Non-healing ulcers Pulmonary:   _21 Uses home oxygen   _22 Productive cough   _23 Hemoptysis   _24 Wheeze  _25 COPD   _26 Asthma Neurologic:  _27 Dizziness  _28 Blackouts   _29 Seizures   _30 History of stroke   _31 History of TIA  _32 Aphasia   _33 Temporary blindness   _34 Dysphagia   _35 Weakness or numbness in arms   _36 Weakness or numbness in legs Musculoskeletal:  _37 Arthritis   _38 Joint swelling   _39 Joint pain   _40 Low back pain Hematologic:  _41 Easy bruising  _42 Easy bleeding   _43 Hypercoagulable state   _44 Anemic   Gastrointestinal:  _45 Blood in stool   _46 Vomiting blood  _47 Gastroesophageal reflux/heartburn   _48 Abdominal pain Genitourinary:  _49 Chronic kidney disease   _50 Difficult urination  _51 Frequent urination  _52 Burning with urination   _53 Hematuria Skin:  _54 Rashes   _55 Ulcers   _56 Wounds Psychological:  _57 History of anxiety   _58  History of major depression.  Physical  Examination  Vitals:   02/19/22 1008  BP: (!) 164/68  Pulse: 78  Weight: 197 lb (89.4 kg)  Height: _59  (1.6 m)   Body mass index is 34.9 kg/m. Gen:  WD/WN, NAD Head: [place]/AT, No temporalis wasting. Ear/Nose/Throat: Hearing grossly intact, nares w/o erythema or drainage, trachea midline Eyes: Conjunctiva clear. Sclera non-icteric Neck: Supple.  Her aortic stenosis murmur radiates into both sides of the neck, but no definitive bruit is heard Pulmonary:  Good air movement, equal and clear to auscultation bilaterally.  Cardiac: RRR, loud systolic murmur from aortic stenosis Vascular:  Vessel Right Left  Radial Palpable Palpable       Musculoskeletal: M/S 5/5 throughout.  No deformity or atrophy.  No edema. Neurologic: CN 2-12 intact. Sensation grossly intact in extremities.  Symmetrical.  Speech is fluent. Motor exam as listed above. Psychiatric: Judgment intact, Mood & affect appropriate for pt's clinical situation. Dermatologic: No rashes or ulcers noted.  No cellulitis or open wounds.     CBC Lab Results  Component Value Date   WBC 6.8 01/11/2022   HGB 10.2 (L) 01/11/2022   HCT 31.3 (L) 01/11/2022   MCV 86.7 01/11/2022   PLT 291 01/11/2022    BMET    Component Value Date/Time   NA 141 07/24/2020 0417   NA 143 05/04/2013 0614   K 4.1 07/24/2020 0417   K 3.8 05/04/2013 3419  CL 104 07/24/2020 0417   CL 110 (H) 05/04/2013 0614   CO2 26 07/24/2020 0417   CO2 27 05/04/2013 0614   GLUCOSE 102 (H) 07/24/2020 0417   GLUCOSE 130 (H) 05/04/2013 0614   BUN 8 07/24/2020 0417   BUN 12 05/04/2013 0614   CREATININE 0.79 07/24/2020 0417   CREATININE 0.79 08/27/2014 1445   CALCIUM 8.5 (L) 07/24/2020 0417   CALCIUM 8.2 (L) 05/04/2013 0614   GFRNONAA >60 07/24/2020 0417   GFRNONAA >60 08/27/2014 1445   GFRAA >60 07/24/2020 0417   GFRAA >60 08/27/2014 1445   CrCl cannot be calculated (Patient's most recent lab result is older than the maximum 21 days  allowed.).  COAG Lab Results  Component Value Date   INR 1.0 06/12/2020   INR 1.0 05/20/2020   INR 1.0 07/30/2019    Radiology No results found.   Assessment/Plan   Uncontrolled type 2 diabetes mellitus with hyperglycemia (HCC) blood glucose control important in reducing the progression of atherosclerotic disease. Also, involved in wound healing. On appropriate medications.     HTN (hypertension) blood pressure control important in reducing the progression of atherosclerotic disease. On appropriate oral medications.  PVD (peripheral vascular disease) ([place]) Her ABIs today are 1.11 on the right and 1.09 on the left with biphasic waveforms and normal digital pressures bilaterally.  Can be rechecked in a year.  Carotid stenosis Carotid duplex today shows 1 to 39% stenosis in the right carotid artery and a widely patent left carotid endarterectomy.  Doing well.  Continue current medical regimen.  Recheck in 1 year.    Leotis Pain, MD  02/19/2022 12:32 PM    This note was created with Dragon medical transcription system.  Any errors from dictation are purely unintentional

## 2022-02-19 NOTE — Assessment & Plan Note (Signed)
Her ABIs today are 1.11 on the right and 1.09 on the left with biphasic waveforms and normal digital pressures bilaterally.  Can be rechecked in a year.

## 2022-02-19 NOTE — Assessment & Plan Note (Signed)
Carotid duplex today shows 1 to 39% stenosis in the right carotid artery and a widely patent left carotid endarterectomy.  Doing well.  Continue current medical regimen.  Recheck in 1 year.

## 2022-02-21 DIAGNOSIS — E1165 Type 2 diabetes mellitus with hyperglycemia: Secondary | ICD-10-CM | POA: Diagnosis not present

## 2022-02-23 DIAGNOSIS — E782 Mixed hyperlipidemia: Secondary | ICD-10-CM | POA: Diagnosis not present

## 2022-02-23 DIAGNOSIS — E1165 Type 2 diabetes mellitus with hyperglycemia: Secondary | ICD-10-CM | POA: Diagnosis not present

## 2022-02-23 DIAGNOSIS — I10 Essential (primary) hypertension: Secondary | ICD-10-CM | POA: Diagnosis not present

## 2022-02-24 ENCOUNTER — Ambulatory Visit: Payer: Medicare HMO | Admitting: Podiatry

## 2022-02-24 ENCOUNTER — Ambulatory Visit (INDEPENDENT_AMBULATORY_CARE_PROVIDER_SITE_OTHER): Payer: Medicare HMO

## 2022-02-24 ENCOUNTER — Other Ambulatory Visit: Payer: Self-pay

## 2022-02-24 ENCOUNTER — Encounter: Payer: Self-pay | Admitting: Podiatry

## 2022-02-24 DIAGNOSIS — E1165 Type 2 diabetes mellitus with hyperglycemia: Secondary | ICD-10-CM | POA: Diagnosis not present

## 2022-02-24 DIAGNOSIS — M109 Gout, unspecified: Secondary | ICD-10-CM

## 2022-02-24 DIAGNOSIS — M778 Other enthesopathies, not elsewhere classified: Secondary | ICD-10-CM | POA: Diagnosis not present

## 2022-02-24 MED ORDER — INDOMETHACIN 50 MG PO CAPS: 50.0000 mg | ORAL_CAPSULE | Freq: Two times a day (BID) | ORAL | 1 refills | Status: DC

## 2022-02-24 MED ORDER — TRIAMCINOLONE ACETONIDE 40 MG/ML IJ SUSP
20.0000 mg | Freq: Once | INTRAMUSCULAR | Status: AC
  Administered 2022-02-24: 20 mg

## 2022-02-24 NOTE — Progress Notes (Signed)
Subjective:  Patient ID: Joyce Potter, female    DOB: [birth year],  MRN: [id number] HPI Chief Complaint  Patient presents with   Foot Pain    Lateral foot and across 4th and 5th toes right - woke up one morning and noticed foot was extremely swollen, aching, ongoing x 2 weeks, no injury, had xray-said no fracture, taking aleve, but not helping, PCP rx'd celebrex, but didn't take due to sulfa allergy and other meds/conditions    Diabetes    Last a1c was 7.3   New Patient (Initial Visit)    76 y.o. female presents with the above complaint.   ROS: Denies fever chills nausea vomiting muscle aches pains calf pain back pain chest pain shortness of breath.  Past Medical History:  Diagnosis Date   Anemia    iron deficiency   Aortic stenosis    Basal cell carcinoma    CAD (coronary artery disease)    s/p Left circumflex stent in 2012   Carotid stenosis    Cataract    High cholesterol    HTN (hypertension)    Hyperlipidemia    IDDM (insulin dependent diabetes mellitus)    Multiple thyroid nodules    Benign   Peptic ulcer disease    Retinal artery occlusion    on left   Past Surgical History:  Procedure Laterality Date   ABDOMINAL HYSTERECTOMY     ARTERY BIOPSY Left 11/19/2015   Procedure: BIOPSY TEMPORAL ARTERY;  Surgeon: Algernon Huxley, MD;  Location: [facility] OR[facility];  Service: Vascular;  Laterality: Left;   BREAST BIOPSY Left 2002   core- neg   BREAST EXCISIONAL BIOPSY     ??? not visible scar on neither breast- pt believes it to be right breast    CARDIAC CATHETERIZATION N/A 08/08/2015   Procedure: Right and Left Heart Cath and Coronary Angiography;  Surgeon: Teodoro Spray, MD;  Location: [place] [facility];  Service: Cardiovascular;  Laterality: N/A;   CARDIAC CATHETERIZATION Bilateral 09/03/2016   Procedure: Right/Left Heart Cath and Coronary Angiography;  Surgeon: Teodoro Spray, MD;  Location: [place] [facility];  Service: Cardiovascular;  Laterality: Bilateral;   CATARACT  EXTRACTION W/ INTRAOCULAR LENS  IMPLANT, BILATERAL     COLONOSCOPY     in 2013- internal hemorrhoids   COLONOSCOPY WITH PROPOFOL N/A 07/07/2018   Procedure: COLONOSCOPY WITH PROPOFOL;  Surgeon: Manya Silvas, MD;  Location: [facility] ENDOSCOPY;  Service: Endoscopy;  Laterality: N/A;   COLONOSCOPY WITH PROPOFOL N/A 05/22/2020   Procedure: COLONOSCOPY WITH PROPOFOL;  Surgeon: Jonathon Bellows, MD;  Location: [facility]COPY;  Service: Gastroenterology;  Laterality: N/A;   CORONARY ANGIOGRAPHY N/A 09/14/2017   Procedure: CORONARY ANGIOGRAPHY;  Surgeon: Teodoro Spray, MD;  Location: [place] [facility];  Service: Cardiovascular;  Laterality: N/A;   CORONARY ANGIOPLASTY     CORONARY STENT INTERVENTION N/A 05/15/2020   Procedure: coronary angiography;  Surgeon: Teodoro Spray, MD;  Location: [place] [facility];  Service: Cardiovascular;  Laterality: N/A;   CORONARY STENT INTERVENTION N/A 05/15/2020   Procedure: CORONARY STENT INTERVENTION;  Surgeon: Isaias Cowman, MD;  Location: [place] [facility];  Service: Cardiovascular;  Laterality: N/A;   CORONARY STENT PLACEMENT     ENDARTERECTOMY Left 10/01/2015   Procedure: ENDARTERECTOMY CAROTID;  Surgeon: Algernon Huxley, MD;  Location: [facility];  Service: Vascular;  Laterality: Left;   ENTEROSCOPY N/A 07/24/2020   Procedure: ENTEROSCOPY;  Surgeon: Virgel Manifold, MD;  Location: [facility] ENDOSCOPY;  Service: Endoscopy;  Laterality: N/A;   ESOPHAGOGASTRODUODENOSCOPY     ESOPHAGOGASTRODUODENOSCOPY (EGD) WITH PROPOFOL N/A 05/31/2016   Procedure: ESOPHAGOGASTRODUODENOSCOPY (EGD) WITH PROPOFOL;  Surgeon: Manya Silvas, MD;  Location: [facility] ENDOSCOPY;  Service: Endoscopy;  Laterality: N/A;   ESOPHAGOGASTRODUODENOSCOPY (EGD) WITH PROPOFOL N/A 07/07/2018   Procedure: ESOPHAGOGASTRODUODENOSCOPY (EGD) WITH PROPOFOL;  Surgeon: Manya Silvas, MD;  Location: [facility] ENDOSCOPY;  Service: Endoscopy;  Laterality: N/A;   ESOPHAGOGASTRODUODENOSCOPY (EGD) WITH PROPOFOL N/A  07/17/2018   Procedure: ESOPHAGOGASTRODUODENOSCOPY (EGD) WITH PROPOFOL;  Surgeon: Lucilla Lame, MD;  Location: [facility] ENDOSCOPY;  Service: Endoscopy;  Laterality: N/A;   ESOPHAGOGASTRODUODENOSCOPY (EGD) WITH PROPOFOL N/A 05/21/2020   Procedure: ESOPHAGOGASTRODUODENOSCOPY (EGD) WITH PROPOFOL;  Surgeon: Jonathon Bellows, MD;  Location: [facility] ENDOSCOPY;  Service: Gastroenterology;  Laterality: N/A;   ESOPHAGOGASTRODUODENOSCOPY (EGD) WITH PROPOFOL N/A 06/13/2020   Procedure: ESOPHAGOGASTRODUODENOSCOPY (EGD) WITH PROPOFOL;  Surgeon: Lucilla Lame, MD;  Location: [facility] ENDOSCOPY;  Service: Endoscopy;  Laterality: N/A;   EYE SURGERY     GIVENS CAPSULE STUDY N/A 04/17/2013   Procedure: GIVENS CAPSULE STUDY;  Surgeon: Arta Silence, MD;  Location: [facility] ENDOSCOPY;  Service: Endoscopy;  Laterality: N/A;  patient ate breakfast at [place] 06/11/2020   Procedure: GIVENS CAPSULE STUDY;  Surgeon: Virgel Manifold, MD;  Location: [facility] ENDOSCOPY;  Service: Endoscopy;  Laterality: N/A;   GIVENS CAPSULE STUDY N/A 01/29/2021   Procedure: GIVENS CAPSULE STUDY;  Surgeon: Virgel Manifold, MD;  Location: [facility] ENDOSCOPY;  Service: Endoscopy;  Laterality: N/A;   GIVENS CAPSULE STUDY N/A 08/24/2021   Procedure: GIVENS CAPSULE STUDY;  Surgeon: Virgel Manifold, MD;  Location: [facility] ENDOSCOPY;  Service: Endoscopy;  Laterality: N/A;   HEMORRHOID BANDING  07/27/2016   Dr Nicholes Stairs   PARTIAL HYSTERECTOMY     RIGHT AND LEFT HEART CATH Bilateral 09/14/2017   Procedure: RIGHT AND LEFT HEART CATH;  Surgeon: Teodoro Spray, MD;  Location: [place] [facility];  Service: Cardiovascular;  Laterality: Bilateral;   RIGHT/LEFT HEART CATH AND CORONARY ANGIOGRAPHY Right 05/15/2020   Procedure: Right/Left Heart cath;  Surgeon: Teodoro Spray, MD;  Location: [place] [facility];  Service: Cardiovascular;  Laterality: Right;   TRIGGER FINGER RELEASE      Current Outpatient Medications:    indomethacin (INDOCIN) 50 MG  capsule, Take 1 capsule (50 mg total) by mouth 2 (two) times daily with a meal., Disp: 60 capsule, Rfl: 1   albuterol (VENTOLIN HFA) 108 (90 Base) MCG/ACT inhaler, Inhale 2 puffs into the lungs every 4 (four) hours as needed for wheezing or shortness of breath. , Disp: , Rfl:    alendronate (FOSAMAX) 70 MG tablet, Take 70 mg by mouth once a week. , Disp: , Rfl:    amitriptyline (ELAVIL) 25 MG tablet, Take 25 mg by mouth at bedtime as needed for sleep. , Disp: , Rfl:    aspirin EC 81 MG EC tablet, Take 1 tablet (81 mg total) by mouth daily. Swallow whole., Disp: 30 tablet, Rfl: 11   atorvastatin (LIPITOR) 20 MG tablet, Take 20 mg by mouth daily., Disp: , Rfl:    azithromycin (ZITHROMAX) 250 MG tablet, Take by mouth., Disp: , Rfl:    baclofen (LIORESAL) 10 MG tablet, Take by mouth., Disp: , Rfl:    BD INSULIN SYRINGE U/F 31G X 5/16" 1 ML MISC, USE TWICE DAILY WITH INSULIN, Disp: , Rfl:    clopidogrel (PLAVIX) 75 MG tablet, Take 1 tablet (75 mg total) by mouth daily with breakfast.,  Disp: 30 tablet, Rfl: 11   diclofenac Sodium (VOLTAREN) 1 % GEL, Apply topically., Disp: , Rfl:    Dulaglutide 1.5 MG/0.5ML SOPN, Inject 1.5 mg into the skin every Saturday. , Disp: , Rfl:    DULoxetine (CYMBALTA) 20 MG capsule, Take 20 mg by mouth 2 (two) times daily., Disp: , Rfl:    ferrous sulfate 325 (65 FE) MG tablet, Take by mouth., Disp: , Rfl:    gabapentin (NEURONTIN) 300 MG capsule, Take 300 mg by mouth 2 (two) times daily. , Disp: , Rfl:    insulin NPH-regular Human (NOVOLIN 70/30) (70-30) 100 UNIT/ML injection, Inject 30 Units into the skin 2 (two) times daily with a meal. , Disp: , Rfl:    meloxicam (MOBIC) 15 MG tablet, Take 15 mg by mouth daily., Disp: , Rfl:    metoCLOPramide (REGLAN) 5 MG tablet, , Disp: , Rfl:    metoprolol tartrate (LOPRESSOR) 100 MG tablet, Take 100 mg by mouth daily., Disp: , Rfl:    Multiple Vitamin (MULTI-VITAMIN) tablet, Take 1 tablet by mouth daily., Disp: , Rfl:    ONETOUCH  VERIO test strip, SMARTSIG:Via Meter, Disp: , Rfl:    pantoprazole (PROTONIX) 40 MG tablet, Take by mouth., Disp: , Rfl:    saccharomyces boulardii (FLORASTOR) 250 MG capsule, Take 250 mg by mouth 2 (two) times daily. , Disp: , Rfl:    SPIRIVA HANDIHALER 18 MCG inhalation capsule, Place 18 mcg into inhaler and inhale daily in the afternoon. , Disp: , Rfl:    traMADol (ULTRAM) 50 MG tablet, Take 50 mg by mouth every 6 (six) hours as needed for moderate pain. , Disp: , Rfl:    Vitamin A 2400 MCG (8000 UT) CAPS, Take by mouth., Disp: , Rfl:  No current facility-administered medications for this visit.  Facility-Administered Medications Ordered in Other Visits:    heparin lock flush 100 unit/mL, 500 Units, Intravenous, Once, Verlon Au, NP   sodium chloride flush (NS) 0.9 % injection 10 mL, 10 mL, Intravenous, Once, Verlon Au, NP  Allergies  Allergen Reactions   Invokamet [Canagliflozin-Metformin Hcl] Other (See Comments)    Yeast Infection   Lovastatin Rash   Penicillins Rash    Has patient had a PCN reaction causing immediate rash, facial/tongue/throat swelling, SOB or lightheadedness with hypotension:Yes Has patient had a PCN reaction causing severe rash involving mucus membranes or skin necrosis:all over body Has patient had a PCN reaction that required hospitalization: No Has patient had a PCN reaction occurring within the last 10 years: No If all of the above answers are "NO", then may proceed with Cephalosporin use.    Vicodin [Hydrocodone-Acetaminophen] Rash   Review of Systems Objective:  There were no vitals filed for this visit.  General: Well developed, nourished, in no acute distress, alert and oriented x3   Dermatological: Skin is warm, dry and supple bilateral. Nails x 10 are well maintained; remaining integument appears unremarkable at this time. There are no open sores, no preulcerative lesions, no rash or signs of infection present.  Vascular: Dorsalis Pedis  artery and Posterior Tibial artery pedal pulses are 2/4 bilateral with immedate capillary fill time. Pedal hair growth present. No varicosities and no lower extremity edema present bilateral.   Neruologic: Grossly intact via light touch bilateral. Vibratory intact via tuning fork bilateral. Protective threshold with Semmes Wienstein monofilament intact to all pedal sites bilateral. Patellar and Achilles deep tendon reflexes 2+ bilateral. No Babinski or clonus noted bilateral.   Musculoskeletal: No  gross boney pedal deformities bilateral. No pain, crepitus, or limitation noted with foot and ankle range of motion bilateral. Muscular strength 5/5 in all groups tested bilateral.  She has soft tissue swelling of the lateral ankle with warmth and erythema on evaluation of the lateral ankle and the peroneal tendons.  She has pain with plantarflexion and eversion.  Gait: Unassisted, Nonantalgic.    Radiographs:  Radiographs taken today do not demonstrate any type of osseous abnormalities on the soft tissue swelling of the ankle.  Assessment & Plan:   Assessment: Probable gouty capsulitis  Plan: Started her on indomethacin and injected 10 mg to the point of maximal tenderness just beneath the lateral malleolus.  I will follow-up with her in 2 to 3 weeks if this subsides and she ever develops that again she is to notify us immediately for an arthritic profile     Rahsaan Weakland T. [place], [place]

## 2022-03-18 ENCOUNTER — Other Ambulatory Visit: Payer: Self-pay | Admitting: *Deleted

## 2022-03-18 ENCOUNTER — Other Ambulatory Visit: Payer: Self-pay

## 2022-03-18 NOTE — Patient Outreach (Signed)
[facility]) Care Management Telephonic RN Care Manager Note   03/18/2022 Name:  Joyce Potter MRN:  [id number] DOB:  04-11-1946  Summary: Successful outreach Mrs Dicecco has laryngitis today  She reports she has been seen by a medical provider and is on antibiotics She reports otherwise she is doing well She was briefly assessed to allowed her to rest her voice   Recommendations/Changes made from today's visit: Assessed for worsening symptoms, needs Encouraged rest, hydration, taking medications as ordered Agreed to outreach at a later time   Subjective: Joyce Potter is an 76 y.o. year old female who is a primary patient of Margaretann Loveless, MD. The care management team was consulted for assistance with care management and/or care coordination needs.    Telephonic RN Care Manager completed Telephone Visit today.   Objective:  Medications Reviewed Today     Reviewed by Kristian Covey, [facility] (Certified Podiatric Assistant) on 02/24/22 at 1004  Med List Status: <None>   Medication Order Taking? Sig Documenting Provider Last Dose Status Informant  albuterol (VENTOLIN HFA) 108 (90 Base) MCG/ACT inhaler [id number] No Inhale 2 puffs into the lungs every 4 (four) hours as needed for wheezing or shortness of breath.  [provider] Taking Active Self  alendronate (FOSAMAX) 70 MG tablet [id number] No Take 70 mg by mouth once a week.  [provider] Taking Active Self           Med Note Beecher Mcardle   Fri May 30, 2020  4:03 PM)    amitriptyline (ELAVIL) 25 MG tablet [id number] No Take 25 mg by mouth at bedtime as needed for sleep.  [provider] Taking Active Self  aspirin EC 81 MG EC tablet [id number] No Take 1 tablet (81 mg total) by mouth daily. Swallow whole. Charise Killian, MD Taking Active Self  atorvastatin (LIPITOR) 20 MG tablet [id number] No Take 20 mg by mouth daily. [provider] Taking Active   azithromycin  (ZITHROMAX) 250 MG tablet [id number] No Take by mouth. [provider] Taking Active   baclofen (LIORESAL) 10 MG tablet [id number] No Take by mouth. [provider] Taking Active   BD INSULIN SYRINGE U/F 31G X 5/16" 1 ML MISC [id number] No USE TWICE DAILY WITH INSULIN [provider] Taking Active   clopidogrel (PLAVIX) 75 MG tablet [id number] No Take 1 tablet (75 mg total) by mouth daily with breakfast. [name], [name], MD Taking Active Self  diclofenac Sodium (VOLTAREN) 1 % GEL [id number] No Apply topically. [provider] Taking Active   Dulaglutide 1.5 MG/0.5ML SOPN [id number] No Inject 1.5 mg into the skin every Saturday.  [provider] Taking Active   DULoxetine (CYMBALTA) 20 MG capsule [id number] No Take 20 mg by mouth 2 (two) times daily. [provider] Taking Active   ferrous sulfate 325 (65 FE) MG tablet [id number] No Take by mouth. [provider] Taking Active   gabapentin (NEURONTIN) 300 MG capsule [id number] No Take 300 mg by mouth 2 (two) times daily.  [provider] Taking Active Self  insulin NPH-regular Human (NOVOLIN 70/30) (70-30) 100 UNIT/ML injection [id number] No Inject 30 Units into the skin 2 (two) times daily with a meal.  [provider] Taking Active Self  meloxicam (MOBIC) 15 MG tablet [id number] No Take 15 mg by mouth daily. [provider] Taking Active   metoCLOPramide (REGLAN) 5 MG tablet [id number] No  [provider] Taking Active   metoprolol tartrate (  LOPRESSOR) 100 MG tablet [id number] No Take 100 mg by mouth daily. [provider] Taking Active Self  Multiple Vitamin (MULTI-VITAMIN) tablet [id number] No Take 1 tablet by mouth daily. [provider] Taking Active   [facility] VERIO test strip [id number] No SMARTSIG:Via Meter [provider] Taking Active   pantoprazole (PROTONIX) 40 MG tablet [id number] No Take by mouth. [provider]  Taking Active   saccharomyces boulardii (FLORASTOR) 250 MG capsule [id number] No Take 250 mg by mouth 2 (two) times daily.  [provider] Taking Active Self  SPIRIVA HANDIHALER 18 MCG inhalation capsule [id number] No Place 18 mcg into inhaler and inhale daily in the afternoon.  [provider] Taking Active Self  traMADol (ULTRAM) 50 MG tablet [id number] No Take 50 mg by mouth every 6 (six) hours as needed for moderate pain.  [provider] Taking Active Self  Vitamin A 2400 MCG (8000 UT) CAPS [id number] No Take by mouth. [provider] Taking Active   Med List Note Sharia Reeve, CPhT 05/20/20 1417): Patient stated she did not take her medications this morning as she had to report here at 0730             SDOH:  (Social Determinants of Health) assessments and interventions performed:    Care Plan  Review of patient past medical history, allergies, medications, health status, including review of consultants reports, laboratory and other test data, was performed as part of comprehensive evaluation for care management services.   Care Plan : RN Care Manager Plan of Care  Updates made by Clinton Gallant, RN since 03/18/2022 12:00 AM     Problem: Complex Care Coordination Needs and disease management in patient with Chronic Anemia, Diabetes, HTN, AVM   Priority: High     Long-Range Goal: Establish Plan of Care for Management Complex SDOH Barriers, Disease management and Care Coordination Needs in patient with Chronic anema, diabetes, AVM, HTN   Start Date: 10/30/2021  Recent Progress: On track  Priority: High  Note:   Current Barriers:  Knowledge Deficits related to plan of care for management of HTN, DMII, and chronic anemia, AVM Care Coordination needs related to Limited education about chronic anemia, diabetes, HTN and AVM* 11/18/21 Cares for ill spouse Jonny Ruiz 01/18/22 December 2022 sinusitis, January 2023 cystitis 01/16/22 [facility] ED &  dx with acute cystitis, elevation of HgA1c from 5.2 to 7.9   03/18/22 pt with laryngitis brief outreach to allow voice rest, ABX+  RN CM Clinical Goal(s):  Patient will verbalize understanding of plan for management of HTN, DMII, and chronic anemia, AVM  through collaboration with RN Care manager, provider, and care team.   Interventions: Encouraged patient to outreach as needed to RN CM or 24 hr nurse line  Inter-disciplinary care team collaboration (see longitudinal plan of care) Evaluation of current treatment plan related to  self management and patient's adherence to plan as established by provider 12/16/21 Encouragement provided related to her management of diabetes and care of her spouse 01/18/22 Iron sucrose/Venofer infusions reviewed for 08/19/21, 08/21/21 & 01/14/22 for the type medicine documented or how administer per pt inquiry Reviewed standard Rights of medication administration  Reviewed the side effects of Venofer (Muscle cramps, nausea, vomiting, strange taste in the mouth, diarrhea, constipation, headache, cough, back pain, joint pain, dizziness, or swelling of the arms/legs may occur. Pain, swelling, or redness at the injection site may occur.) Encouraged her to always call to MD office as soon as possible with any  abnormal reactions symptoms Questions answered Reviewed her urinalysis Discussed THN progression   Hypertension Interventions: Goal not addressed 03/18/22 short term Last practice recorded BP readings:  BP Readings from Last 3 Encounters:  02/19/22 (!) 164/68  01/25/22 (!) 136/49  01/21/22 (!) 132/47  Most recent eGFR/CrCl: No results found for: EGFR  No components found for: CRCL  Evaluation of current treatment plan related to hypertension self management and patient's adherence to plan as established by provider; Reviewed medications with patient and discussed importance of compliance;  Diabetes Interventions: goal not addressed 03/18/22 Long term  goal recovered from acute cystitis 12/16/21 recovering from sinus infection/cold with elevations in cbgs voiced concern with possible increase in HgA1c Assessed patient's understanding of A1c goal: <7% Provided education to patient about basic DM disease process; Reviewed medications with patient and discussed importance of medication adherence; Discussed plans with patient for ongoing care management follow up and provided patient with direct contact information for care management team; HgA1c 7.9 on 12/15/21, was 5.2 on 08/13/21 per Care everywhere Lab Results  Component Value Date   HGBA1C 7.3 (H) 05/21/2020  Anemia/Bleeding Interventions: goal on track yes Short term goal  Assessment of understanding of anemia/bleeding disorder diagnosis  Basic overview and discussion of anemia/bleeding disorder or acute disease state  Medications reviewed  Counseled on importance of regular laboratory monitoring as directed by provider Provided education about signs and symptoms of active bleeding such as stomach discomfort, coughing up blood or blood tinged secretions, bleeding from the gums/teeth, nosebleeds, increased bruising, blood in the urine/stool and/or if a traumatic injury occurs, regardless of severity of injury  recommended promotion of rest and energy-conserving measures to manage fatigue, such as balancing activity with periods of rest Assessed social determinant of health barriers  Lab Results  Component Value Date   WBC 6.8 01/11/2022   HGB 10.2 (L) 01/11/2022   HCT 31.3 (L) 01/11/2022   MCV 86.7 01/11/2022   PLT 291 01/11/2022   Patient Goals/Self-Care Activities: Patient will self administer medications as prescribed Patient will attend all scheduled provider appointments Patient will continue to perform ADL's independently Patient will continue to perform IADL's independently Patient will call provider office for new concerns or questions    Follow Up Plan:  The patient has  been provided with contact information for the care management team and has been advised to call with any health related questions or concerns.  The care management team will reach out to the patient again over the next 30+ business days.       Plan: The patient has been provided with contact information for the care management team and has been advised to call with any health related questions or concerns.  The care management team will reach out to the patient again over the next 30+ business days.  Rathana Viveros L. Noelle Penner, RN, BSN, CCM [facility] Telephonic Care Management Care Coordinator Office number [phone] [facility] number [phone] Fax number [phone]

## 2022-03-23 DIAGNOSIS — I350 Nonrheumatic aortic (valve) stenosis: Secondary | ICD-10-CM | POA: Diagnosis not present

## 2022-03-27 DIAGNOSIS — E1165 Type 2 diabetes mellitus with hyperglycemia: Secondary | ICD-10-CM | POA: Diagnosis not present

## 2022-03-29 ENCOUNTER — Other Ambulatory Visit: Payer: Self-pay | Admitting: Internal Medicine

## 2022-03-29 DIAGNOSIS — Z1231 Encounter for screening mammogram for malignant neoplasm of breast: Secondary | ICD-10-CM

## 2022-03-31 ENCOUNTER — Encounter: Payer: Self-pay | Admitting: Podiatry

## 2022-03-31 ENCOUNTER — Ambulatory Visit: Payer: Medicare HMO | Admitting: Podiatry

## 2022-03-31 DIAGNOSIS — M778 Other enthesopathies, not elsewhere classified: Secondary | ICD-10-CM

## 2022-03-31 NOTE — Progress Notes (Signed)
She presents today for follow-up of her capsulitis and gout she states feeling a whole lot better really does not hurt at all now. ? ?Objective: Vital signs stable alert oriented x3 she has no pain around the ankle laterally right at all.  She is got full range of motion no tenderness. ? ?Assessment: Well-healing gouty arthritis capsulitis of the right ankle. ? ?Plan: Follow-up with her as needed. ? ?Should she ever call stating that she has a red swollen joint we need to provide her with a requisition for an arthritic profile. ?

## 2022-04-01 ENCOUNTER — Other Ambulatory Visit: Payer: Self-pay | Admitting: *Deleted

## 2022-04-01 NOTE — Patient Outreach (Addendum)
[facility]) Care Management ?Telephonic RN Care Manager Note ? ? ?04/06/2022 ?Name:  Joyce Potter MRN:  [id number] DOB:  12-15-1946 ? ?Summary: ?Successful outreach for follow up ?Joyce Potter reports she is better today  ? ? ?Recent cardiac ultra sound completed ?She has questions about the impression ?She confirms no recent infection or surgeries, takes magnesium She has a history of 2 stents a yr apart and a history of sinus infection ? ?Gout episode discussed ?Questions asked ? ?Signify health confirmed to be related to her insurance carrier and not interfering with her [facility] services ? ?She asked questions about [facility], Dionisio David   ? ? ?Recommendations/Changes made from today's visit: ?Further outreach for assessment of needs and home management updates ?Answered questions for her about her recent cardiac ultrasound, gout, Dr Neoma Laming ?Discussed mild valvular regurgitation, moderate valvular stenosis, severely thickened, partially mobile leaflets ?Updated her that signify health does not conflict with [facility] services ?Reviewed Making healthy lifestyle changes. ?Taking medications to treat symptoms. ?Taking blood thinners to reduce the risk of blood clots for atrial fibrillation. ?Assisted pt to reset password for [facility] patient portal after a failed attempt to reach the [facility] office [phone] No answer ?Assisted by guiding her on how to create a new password to get in her [facility] patient portal ?Sent EMMI education on gout via e-mail ? ?Subjective: ?Joyce Potter is an 76 y.o. year old female who is a primary patient of [name], MD. The care management team was consulted for assistance with care management and/or care coordination needs.   ? ?Telephonic RN Care Manager completed Telephone Visit today.  ? ?Objective: ? ?Medications Reviewed Today   ? ? Reviewed by Rip Harbour, [facility] (Certified Podiatric Assistant) on 03/31/22 at [place]  List Status: <None>  ? ?Medication Order Taking? Sig Documenting Provider Last Dose Status Informant  ?albuterol (VENTOLIN HFA) 108 (90 Base) MCG/ACT inhaler [id number] No Inhale 2 puffs into the lungs every 4 (four) hours as needed for wheezing or shortness of breath.  [provider] Taking Active Self  ?alendronate (FOSAMAX) 70 MG tablet [id number] No Take 70 mg by mouth once a week.  [provider] Taking Active Self  ?         ?Med Note [name]   Fri May 30, 2020  4:03 PM)    ?amitriptyline (ELAVIL) 25 MG tablet [id number] No Take 25 mg by mouth at bedtime as needed for sleep.  [provider] Taking Active Self  ?aspirin EC 81 MG EC tablet [id number] No Take 1 tablet (81 mg total) by mouth daily. Swallow whole. [name], MD Taking Active Self  ?atorvastatin (LIPITOR) 20 MG tablet [id number] No Take 20 mg by mouth daily. [provider] Taking Active   ?azithromycin (ZITHROMAX) 250 MG tablet [id number] No Take by mouth. [provider] Taking Active   ?baclofen (LIORESAL) 10 MG tablet [id number] No Take by mouth. [provider] Taking Active   ?BD INSULIN SYRINGE U/F 31G X 5/16" 1 ML MISC [id number] No USE TWICE DAILY WITH INSULIN [provider] Taking Active   ?clopidogrel (PLAVIX) 75 MG tablet [id number] No Take 1 tablet (75 mg total) by mouth daily with breakfast. [name], [name], MD Taking Active Self  ?diclofenac Sodium (VOLTAREN) 1 % GEL [id number] No Apply topically. [provider] Taking Active   ?Dulaglutide 1.5 MG/0.5ML SOPN [id number] No Inject 1.5 mg into the skin every Saturday.  [provider] Taking Active   ?DULoxetine (CYMBALTA) 20 MG capsule [id number] No Take 20 mg by mouth 2 (two) times daily. [provider] Taking Active   ?ferrous sulfate 325 (65 FE) MG tablet [id number] No Take by mouth. [provider] Taking Active   ?gabapentin (NEURONTIN) 300 MG capsule [id number] No Take  300 mg by mouth 2 (two) times daily.  [provider] Taking Active Self  ?indomethacin (INDOCIN) 50 MG capsule [id number]  Take 1 capsule (50 mg total) by mouth 2 (two) times daily with a meal. [name], [name], [name]  Active   ?insulin NPH-regular Human (NOVOLIN 70/30) (70-30) 100 UNIT/ML injection [id number] No Inject 30 Units into the skin 2 (two) times daily with a meal.  [provider] Taking Active Self  ?meloxicam (MOBIC) 15 MG tablet [id number] No Take 15 mg by mouth daily. [provider] Taking Active   ?metoCLOPramide (REGLAN) 5 MG tablet [id number] No  [provider] Taking Active   ?metoprolol tartrate (LOPRESSOR) 100 MG tablet [id number] No Take 100 mg by mouth daily. [provider] Taking Active Self  ?Multiple Vitamin (MULTI-VITAMIN) tablet [id number] No Take 1 tablet by mouth daily. [provider] Taking Active   ?ONETOUCH VERIO test strip [id number] No SMARTSIG:Via Meter [provider] Taking Active   ?pantoprazole (PROTONIX) 40 MG tablet [id number] No Take by mouth. [provider] Taking Active   ?saccharomyces boulardii (FLORASTOR) 250 MG capsule [id number] No Take 250 mg by mouth 2 (two) times daily.  [provider] Taking Active Self  ?SPIRIVA HANDIHALER 18 MCG inhalation capsule [id number] No Place 18 mcg into inhaler and inhale daily in the afternoon.  [provider] Taking Active Self  ?traMADol (ULTRAM) 50 MG tablet [id number] No Take 50 mg by mouth every 6 (six) hours as needed for moderate pain.  [provider] Taking Active Self  ?Vitamin A 2400 MCG (8000 UT) CAPS [id number] No Take by mouth. [provider] Taking Active   ?Med List Note [name], CPhT 05/20/20 1417): Patient stated she did not take her medications this morning as she had to report here at 0730  ? ?  ?  ? ?  ? ? ? ?SDOH:  (Social Determinants of Health) assessments and interventions performed:  ? ? ?Care  Plan ? ?Review of patient past medical history, allergies, medications, health status, including review of consultants reports, laboratory and other test data, was performed as part of comprehensive evaluation for care management services.  ? ?Care Plan : RN Care Manager Plan of Care  ?Updates made by Barbaraann Faster, RN since 04/06/2022 12:00 AM  ?  ? ?Problem: Complex Care Coordination Needs and disease management in patient with Chronic Anemia, Diabetes, HTN, AVM   ?Priority: High  ?  ? ?Long-Range Goal: Establish Plan of Care for Management Complex SDOH Barriers, Disease management and Care Coordination Needs in patient with Chronic anema, diabetes, AVM, HTN   ?Start Date: 10/30/2021  ?This Visit's Progress: On track  ?Recent Progress: On track  ?Priority: High  ?Note:   ?Current Barriers:  ?Knowledge Deficits related to plan of care for management of HTN, DMII, and chronic anemia, AVM ?Care Coordination needs related to Limited education about chronic anemia, diabetes, HTN and AVM* ?11/18/21 Cares for ill spouse [name] ?01/18/22 December 2022 sinusitis, January 2023 cystitis 01/16/22 [facility] ED & dx with acute cystitis, elevation of HgA1c from 5.2 to 7.9   ?03/18/22 pt with laryngitis brief outreach to allow  voice rest, ABX+ ? ?RN CM Clinical Goal(s):  ?Patient will verbalize understanding of plan for management of HTN, DMII, and chronic anemia, AVM  through collaboration with RN Care manager, provider, and care team.  ? ?Interventions: ?Encouraged patient to outreach as needed to RN CM or 24 hr nurse line  ?Inter-disciplinary care team collaboration (see longitudinal plan of care) ?Evaluation of current treatment plan related to  self management and patient's adherence to plan as established by provider ?12/16/21 Encouragement provided related to her management of diabetes and care of her spouse ?01/18/22 Iron sucrose/Venofer infusions reviewed for 08/19/21, 08/21/21 & 01/14/22 for the type medicine documented or  how administer per pt inquiry ?Reviewed standard Rights of medication administration  ?Reviewed the side effects of Venofer (Muscle cramps, nausea, vomiting, strange taste in the mouth, diarrhea, constipation

## 2022-04-09 ENCOUNTER — Other Ambulatory Visit: Payer: Self-pay | Admitting: Emergency Medicine

## 2022-04-09 DIAGNOSIS — E1159 Type 2 diabetes mellitus with other circulatory complications: Secondary | ICD-10-CM | POA: Diagnosis not present

## 2022-04-09 DIAGNOSIS — D500 Iron deficiency anemia secondary to blood loss (chronic): Secondary | ICD-10-CM

## 2022-04-12 ENCOUNTER — Inpatient Hospital Stay: Payer: Medicare HMO | Attending: Oncology

## 2022-04-12 DIAGNOSIS — Z87891 Personal history of nicotine dependence: Secondary | ICD-10-CM | POA: Diagnosis not present

## 2022-04-12 DIAGNOSIS — I10 Essential (primary) hypertension: Secondary | ICD-10-CM | POA: Diagnosis not present

## 2022-04-12 DIAGNOSIS — K921 Melena: Secondary | ICD-10-CM | POA: Insufficient documentation

## 2022-04-12 DIAGNOSIS — D500 Iron deficiency anemia secondary to blood loss (chronic): Secondary | ICD-10-CM

## 2022-04-12 DIAGNOSIS — D509 Iron deficiency anemia, unspecified: Secondary | ICD-10-CM | POA: Insufficient documentation

## 2022-04-12 DIAGNOSIS — R739 Hyperglycemia, unspecified: Secondary | ICD-10-CM | POA: Insufficient documentation

## 2022-04-12 LAB — CBC WITH DIFFERENTIAL/PLATELET
Abs Immature Granulocytes: 0.01 10*3/uL (ref 0.00–0.07)
Basophils Absolute: 0 10*3/uL (ref 0.0–0.1)
Basophils Relative: 1 %
Eosinophils Absolute: 0.3 10*3/uL (ref 0.0–0.5)
Eosinophils Relative: 6 %
HCT: 38.9 % (ref 36.0–46.0)
Hemoglobin: 12.5 g/dL (ref 12.0–15.0)
Immature Granulocytes: 0 %
Lymphocytes Relative: 24 %
Lymphs Abs: 1.2 10*3/uL (ref 0.7–4.0)
MCH: 27.8 pg (ref 26.0–34.0)
MCHC: 32.1 g/dL (ref 30.0–36.0)
MCV: 86.4 fL (ref 80.0–100.0)
Monocytes Absolute: 0.5 10*3/uL (ref 0.1–1.0)
Monocytes Relative: 10 %
Neutro Abs: 3 10*3/uL (ref 1.7–7.7)
Neutrophils Relative %: 59 %
Platelets: 243 10*3/uL (ref 150–400)
RBC: 4.5 MIL/uL (ref 3.87–5.11)
RDW: 13.6 % (ref 11.5–15.5)
WBC: 5.1 10*3/uL (ref 4.0–10.5)
nRBC: 0 % (ref 0.0–0.2)

## 2022-04-12 LAB — IRON AND TIBC
Iron: 51 ug/dL (ref 28–170)
Saturation Ratios: 16 % (ref 10.4–31.8)
TIBC: 314 ug/dL (ref 250–450)
UIBC: 263 ug/dL

## 2022-04-12 LAB — FERRITIN: Ferritin: 45 ng/mL (ref 11–307)

## 2022-04-13 ENCOUNTER — Inpatient Hospital Stay (HOSPITAL_BASED_OUTPATIENT_CLINIC_OR_DEPARTMENT_OTHER): Payer: Medicare HMO | Admitting: Nurse Practitioner

## 2022-04-13 ENCOUNTER — Inpatient Hospital Stay: Payer: Medicare HMO

## 2022-04-13 VITALS — BP 131/65 | HR 95 | Temp 98.0°F | Resp 18 | Wt 194.0 lb

## 2022-04-13 DIAGNOSIS — D509 Iron deficiency anemia, unspecified: Secondary | ICD-10-CM | POA: Diagnosis not present

## 2022-04-13 DIAGNOSIS — D500 Iron deficiency anemia secondary to blood loss (chronic): Secondary | ICD-10-CM

## 2022-04-13 DIAGNOSIS — K921 Melena: Secondary | ICD-10-CM | POA: Diagnosis not present

## 2022-04-13 DIAGNOSIS — I10 Essential (primary) hypertension: Secondary | ICD-10-CM | POA: Diagnosis not present

## 2022-04-13 DIAGNOSIS — R739 Hyperglycemia, unspecified: Secondary | ICD-10-CM | POA: Diagnosis not present

## 2022-04-13 DIAGNOSIS — Z87891 Personal history of nicotine dependence: Secondary | ICD-10-CM | POA: Diagnosis not present

## 2022-04-13 NOTE — Progress Notes (Signed)
?Joyce Potter  ?[phone] [phone] ? ?ID: Joyce Potter OB: 27-Dec-1946  MR#: [id number]  VCB#:[id number] ? ?Patient Care Team: ?[name], MD as PCP - General (Internal Medicine) ?Lloyd Huger, MD as Consulting Physician (Hematology and Oncology) ?Barbaraann Faster, RN as [place] Management ?Teodoro Spray, MD as Consulting Physician (Cardiology) ?[name], MD as Physician Assistant (Endocrinology) ?Virgel Manifold, MD (Inactive) as Consulting Physician (Gastroenterology) ?Jacquelin Hawking, NP as Nurse Practitioner (Oncology) ?Birder Robson, MD as Referring Physician (Ophthalmology) ? ?CHIEF COMPLAINT: Iron deficiency anemia, secondary to chronic blood loss. ? ?INTERVAL HISTORY: Patient returns to clinic for repeat laboratory, follow up, and consideration of IV venofer. She feels well today and denies complaints. She denies episodes of black or bloody stools. She has no neurologic complaints. Appetite is good and she denies unintentional weight loss. She denies recent fevers or illness. She denies chest pain, shortness of breath, cough, or hemoptysis. She denies nausea, vomiting, or diarrhea. No urinary complaints. No further specific complaints today.  ? ?REVIEW OF SYSTEMS:   ?Review of Systems  ?Constitutional:  Negative for chills, fever, malaise/fatigue and weight loss.  ?HENT:  Negative for hearing loss, nosebleeds, sore throat and tinnitus.   ?Eyes:  Negative for blurred vision and double vision.  ?Respiratory:  Negative for cough, hemoptysis, shortness of breath and wheezing.   ?Cardiovascular:  Negative for chest pain, palpitations and leg swelling.  ?Gastrointestinal:  Negative for abdominal pain, blood in stool, constipation, diarrhea, melena, nausea and vomiting.  ?Genitourinary:  Negative for dysuria, flank pain, hematuria and urgency.  ?Musculoskeletal:  Negative for back pain, falls, joint pain and myalgias.   ?Skin:  Negative for itching and rash.  ?Neurological:  Negative for dizziness, tingling, sensory change, loss of consciousness, weakness and headaches.  ?Endo/Heme/Allergies:  Negative for environmental allergies. Does not bruise/bleed easily.  ?Psychiatric/Behavioral:  Negative for depression. The patient is not nervous/anxious and does not have insomnia.   ?As per HPI. Otherwise, a complete review of systems is negative. ? ?PAST MEDICAL HISTORY: ?Past Medical History:  ?Diagnosis Date  ? Anemia   ? iron deficiency  ? Aortic stenosis   ? Basal cell carcinoma   ? CAD (coronary artery disease)   ? s/p Left circumflex stent in 2012  ? Carotid stenosis   ? Cataract   ? High cholesterol   ? HTN (hypertension)   ? Hyperlipidemia   ? IDDM (insulin dependent diabetes mellitus)   ? Multiple thyroid nodules   ? Benign  ? Peptic ulcer disease   ? Retinal artery occlusion   ? on left  ? ? ?PAST SURGICAL HISTORY: ?Past Surgical History:  ?Procedure Laterality Date  ? ABDOMINAL HYSTERECTOMY    ? ARTERY BIOPSY Left 11/19/2015  ? Procedure: BIOPSY TEMPORAL ARTERY;  Surgeon: Algernon Huxley, MD;  Location: [facility] ORS;  Service: Vascular;  Laterality: Left;  ? BREAST BIOPSY Left 2002  ? core- neg  ? BREAST EXCISIONAL BIOPSY    ? ??? not visible scar on neither breast- pt believes it to be right breast   ? CARDIAC CATHETERIZATION N/A 08/08/2015  ? Procedure: Right and Left Heart Cath and Coronary Angiography;  Surgeon: Teodoro Spray, MD;  Location: [place] CV LAB;  Service: Cardiovascular;  Laterality: N/A;  ? CARDIAC CATHETERIZATION Bilateral 09/03/2016  ? Procedure: Right/Left Heart Cath and Coronary Angiography;  Surgeon: Teodoro Spray, MD;  Location: [place] CV LAB;  Service: Cardiovascular;  Laterality: Bilateral;  ? CATARACT EXTRACTION W/ INTRAOCULAR LENS  IMPLANT, BILATERAL    ? COLONOSCOPY    ? in 2013- internal hemorrhoids  ? COLONOSCOPY WITH PROPOFOL N/A 07/07/2018  ? Procedure: COLONOSCOPY WITH PROPOFOL;  Surgeon:  Manya Silvas, MD;  Location[facility] END[facility]COPY;  Service: Endoscopy;  Laterality: N/A;  ? COLONOSCOPY WITH PROPOFOL N/A 05/22/2020  ? Procedure: COLONOSCOPY WITH PROPOFOL;  Surgeon: Jonathon Bellows, MD;  Location: [facility] ENDOSCOPY;  Service: Gastroenterology;  Laterality: N/A;  ? CORONARY ANGIOGRAPHY N/A 09/14/2017  ? Procedure: CORONARY ANGIOGRAPHY;  Surgeon: Teodoro Spray, MD;  Location: [place] CV LAB;  Service: Cardiovascular;  Laterality: N/A;  ? CORONARY ANGIOPLASTY    ? CORONARY STENT INTERVENTION N/A 05/15/2020  ? Procedure: coronary angiography;  Surgeon: Teodoro Spray, MD;  Location: [place] CV LAB;  Service: Cardiovascular;  Laterality: N/A;  ? CORONARY STENT INTERVENTION N/A 05/15/2020  ? Procedure: CORONARY STENT INTERVENTION;  Surgeon: Isaias Cowman, MD;  Location: [place] CV LAB;  Service: Cardiovascular;  Laterality: N/A;  ? CORONARY STENT PLACEMENT    ? ENDARTERECTOMY Left 10/01/2015  ? Procedure: ENDARTERECTOMY CAROTID;  Surgeon: Algernon Huxley, MD;  Location: [facility] ORS;  Service: Vascular;  Laterality: Left;  ? ENTEROSCOPY N/A 07/24/2020  ? Procedure: ENTEROSCOPY;  Surgeon: Virgel Manifold, MD;  Location: [facility] ENDOSCOPY;  Service: Endoscopy;  Laterality: N/A;  ? ESOPHAGOGASTRODUODENOSCOPY    ? ESOPHAGOGASTRODUODENOSCOPY (EGD) WITH PROPOFOL N/A 05/31/2016  ? Procedure: ESOPHAGOGASTRODUODENOSCOPY (EGD) WITH PROPOFOL;  Surgeon: Manya Silvas, MD;  Location: [facility] ENDOSCOPY;  Service: Endoscopy;  Laterality: N/A;  ? ESOPHAGOGASTRODUODENOSCOPY (EGD) WITH PROPOFOL N/A 07/07/2018  ? Procedure: ESOPHAGOGASTRODUODENOSCOPY (EGD) WITH PROPOFOL;  Surgeon: Manya Silvas, MD;  Location: [facility] ENDOSCOPY;  Service: Endoscopy;  Laterality: N/A;  ? ESOPHAGOGASTRODUODENOSCOPY (EGD) WITH PROPOFOL N/A 07/17/2018  ? Procedure: ESOPHAGOGASTRODUODENOSCOPY (EGD) WITH PROPOFOL;  Surgeon: Lucilla Lame, MD;  Location: [facility] ENDOSCOPY;  Service: Endoscopy;  Laterality: N/A;  ? ESOPHAGOGASTRODUODENOSCOPY  (EGD) WITH PROPOFOL N/A 05/21/2020  ? Procedure: ESOPHAGOGASTRODUODENOSCOPY (EGD) WITH PROPOFOL;  Surgeon: Jonathon Bellows, MD;  Location: [facility] ENDOSCOPY;  Service: Gastroenterology;  Laterality: N/A;  ? ESOPHAGOGASTRODUODENOSCOPY (EGD) WITH PROPOFOL N/A 06/13/2020  ? Procedure: ESOPHAGOGASTRODUODENOSCOPY (EGD) WITH PROPOFOL;  Surgeon: Lucilla Lame, MD;  Location: [facility] ENDOSCOPY;  Service: Endoscopy;  Laterality: N/A;  ? EYE SURGERY    ? GIVENS CAPSULE STUDY N/A 04/17/2013  ? Procedure: GIVENS CAPSULE STUDY;  Surgeon: Arta Silence, MD;  Location: [facility] ENDOSCOPY;  Service: Endoscopy;  Laterality: N/A;  patient ate breakfast at 7am   ? GIVENS CAPSULE STUDY N/A 06/11/2020  ? Procedure: GIVENS CAPSULE STUDY;  Surgeon: Virgel Manifold, MD;  Location: [facility] ENDOSCOPY;  Service: Endoscopy;  Laterality: N/A;  ? GIVENS CAPSULE STUDY N/A 01/29/2021  ? Procedure: GIVENS CAPSULE STUDY;  Surgeon: Virgel Manifold, MD;  Location: [facility] ENDOSCOPY;  Service: Endoscopy;  Laterality: N/A;  ? GIVENS CAPSULE STUDY N/A 08/24/2021  ? Procedure: GIVENS CAPSULE STUDY;  Surgeon: Virgel Manifold, MD;  Location: [facility] ENDOSCOPY;  Service: Endoscopy;  Laterality: N/A;  ? HEMORRHOID BANDING  07/27/2016  ? Dr Nicholes Stairs  ? PARTIAL HYSTERECTOMY    ? RIGHT AND LEFT HEART CATH Bilateral 09/14/2017  ? Procedure: RIGHT AND LEFT HEART CATH;  Surgeon: Teodoro Spray, MD;  Location: [place] CV LAB;  Service: Cardiovascular;  Laterality: Bilateral;  ? RIGHT/LEFT HEART CATH AND CORONARY ANGIOGRAPHY Right 05/15/2020  ? Procedure: Right/Left Heart cath;  Surgeon: Teodoro Spray, MD;  Location: [place]  CV LAB;  Service: Cardiovascular;  Laterality: Right;  ? TRIGGER FINGER RELEASE    ? ? ?FAMILY HISTORY ?Family History  ?Problem Relation Age of Onset  ? Uterine cancer Mother   ? Breast cancer Mother 41  ? Seizures Father   ? Stroke Father   ? Diabetes Father   ? COPD Father   ? Colon cancer Neg Hx   ? ?  ? ADVANCED DIRECTIVES:  ? ? ?HEALTH  MAINTENANCE: ?Social History  ? ?Tobacco Use  ? Smoking status: Former  ?  Types: Cigarettes  ?  Quit date: 02/15/1985  ?  Years since quitting: 37.1  ? Smokeless tobacco: Never  ? Tobacco comments:  ?  quit abou

## 2022-04-14 ENCOUNTER — Other Ambulatory Visit: Payer: Medicare PPO

## 2022-04-14 ENCOUNTER — Other Ambulatory Visit: Payer: Self-pay | Admitting: *Deleted

## 2022-04-14 DIAGNOSIS — I2510 Atherosclerotic heart disease of native coronary artery without angina pectoris: Secondary | ICD-10-CM | POA: Diagnosis not present

## 2022-04-14 DIAGNOSIS — I10 Essential (primary) hypertension: Secondary | ICD-10-CM | POA: Diagnosis not present

## 2022-04-14 DIAGNOSIS — R002 Palpitations: Secondary | ICD-10-CM | POA: Diagnosis not present

## 2022-04-14 DIAGNOSIS — E1129 Type 2 diabetes mellitus with other diabetic kidney complication: Secondary | ICD-10-CM | POA: Diagnosis not present

## 2022-04-14 DIAGNOSIS — G4733 Obstructive sleep apnea (adult) (pediatric): Secondary | ICD-10-CM | POA: Diagnosis not present

## 2022-04-14 DIAGNOSIS — I350 Nonrheumatic aortic (valve) stenosis: Secondary | ICD-10-CM | POA: Diagnosis not present

## 2022-04-14 DIAGNOSIS — I739 Peripheral vascular disease, unspecified: Secondary | ICD-10-CM | POA: Diagnosis not present

## 2022-04-14 DIAGNOSIS — K5521 Angiodysplasia of colon with hemorrhage: Secondary | ICD-10-CM | POA: Diagnosis not present

## 2022-04-14 DIAGNOSIS — E1169 Type 2 diabetes mellitus with other specified complication: Secondary | ICD-10-CM | POA: Diagnosis not present

## 2022-04-14 NOTE — Patient Outreach (Signed)
[facility]) Care Management ?Telephonic RN Care Manager Note ? ? ?04/14/2022 ?Name:  Joyce Potter MRN:  [id number] DOB:  May 04, 1946 ? ?Summary: ?[facility] Unsuccessful outreach ?Outreach attempt to the listed at the preferred outreach number in [place]  ?No answer. [facility] RN CM left HIPAA [facility] Portability and Accountability Act) compliant voicemail message along with CM?s contact info.  ? ?Subjective: ?Joyce Potter is an 76 y.o. year old female who is a primary patient of [name], MD. The care management team was consulted for assistance with care management and/or care coordination needs.   ? ?Telephonic RN Care Manager completed Telephone Visit today.  ? ?Objective: ? ?Medications Reviewed Today   ? ? Reviewed by Drue Dun, RN (Registered Nurse) on 04/13/22 at 1343  Med List Status: <None>  ? ?Medication Order Taking? Sig Documenting Provider Last Dose Status Informant  ?albuterol (VENTOLIN HFA) 108 (90 Base) MCG/ACT inhaler [id number]  Inhale 2 puffs into the lungs every 4 (four) hours as needed for wheezing or shortness of breath.  [provider]  Active Self  ?alendronate (FOSAMAX) 70 MG tablet [id number]  Take 70 mg by mouth once a week.  [provider]  Active Self  ?         ?Med Note [name]   Fri May 30, 2020  4:03 PM)    ?amitriptyline (ELAVIL) 25 MG tablet [id number]  Take 25 mg by mouth at bedtime as needed for sleep.  [provider]  Active Self  ?aspirin EC 81 MG EC tablet [id number]  Take 1 tablet (81 mg total) by mouth daily. Swallow whole. [name], MD  Active Self  ?atorvastatin (LIPITOR) 20 MG tablet [id number]  Take 20 mg by mouth daily. [provider]  Active   ?azithromycin (ZITHROMAX) 250 MG tablet [id number]  Take by mouth. [provider]  Active   ?baclofen (LIORESAL) 10 MG tablet [id number]  Take by mouth. [provider]  Active   ?BD INSULIN SYRINGE U/F 31G X 5/16" 1 ML MISC  [id number]  USE TWICE DAILY WITH INSULIN [provider]  Active   ?CELEBREX 100 MG capsule [id number]  Take 100 mg by mouth 2 (two) times daily as needed. [provider]  Active   ?clopidogrel (PLAVIX) 75 MG tablet [id number]  Take 1 tablet (75 mg total) by mouth daily with breakfast. [name], [name], MD  Active Self  ?diclofenac Sodium (VOLTAREN) 1 % GEL [id number]  Apply topically. [provider]  Active   ?Dulaglutide 1.5 MG/0.5ML SOPN [id number]  Inject 1.5 mg into the skin every Saturday.  [provider]  Active   ?DULoxetine (CYMBALTA) 20 MG capsule [id number]  Take 20 mg by mouth 2 (two) times daily. [provider]  Active   ?ferrous sulfate 325 (65 FE) MG tablet [id number]  Take by mouth. [provider]  Active   ?gabapentin (NEURONTIN) 300 MG capsule [id number]  Take 300 mg by mouth 2 (two) times daily.  [provider]  Active Self  ?indomethacin (INDOCIN) 50 MG capsule [id number]  Take 1 capsule (50 mg total) by mouth 2 (two) times daily with a meal. [name], [name], [name]  Active   ?insulin NPH-regular Human (NOVOLIN 70/30) (70-30) 100 UNIT/ML injection [id number]  Inject 30 Units into the skin 2 (two) times daily with a meal.  [provider]  Active Self  ?meloxicam (MOBIC) 15 MG tablet [id number]  Take 15 mg by mouth daily.  [provider]  Active   ?metoCLOPramide (REGLAN) 5 MG tablet [id number]   [provider]  Active   ?metoprolol tartrate (LOPRESSOR) 100 MG tablet [id number]  Take 100 mg by mouth daily. [provider]  Active Self  ?Multiple Vitamin (MULTI-VITAMIN) tablet [id number]  Take 1 tablet by mouth daily. [provider]  Active   ?ONETOUCH VERIO test strip [id number]  SMARTSIG:Via Meter [provider]  Active   ?pantoprazole (PROTONIX) 40 MG tablet [id number]  Take by mouth. [provider]  Active   ?saccharomyces boulardii (FLORASTOR) 250 MG capsule [id number]  Take 250  mg by mouth 2 (two) times daily.  [provider]  Active Self  ?SPIRIVA HANDIHALER 18 MCG inhalation capsule [id number]  Place 18 mcg into inhaler and inhale daily in the afternoon.  [provider]  Active Self  ?traMADol (ULTRAM) 50 MG tablet [id number]  Take 50 mg by mouth every 6 (six) hours as needed for moderate pain.  [provider]  Active Self  ?Vitamin A 2400 MCG (8000 UT) CAPS [id number]  Take by mouth. [provider]  Active   ?Med List Note [name], CPhT 05/20/20 1417): Patient stated she did not take her medications this morning as she had to report here at 0730  ? ?  ?  ? ?  ? ? ? ?SDOH:  (Social Determinants of Health) assessments and interventions performed:  ? ? ?Care Plan ? ?Review of patient past medical history, allergies, medications, health status, including review of consultants reports, laboratory and other test data, was performed as part of comprehensive evaluation for care management services.  ? ?There are no care plans that you recently modified to display for this patient. ?  ? ?Plan: The patient has been provided with contact information for the care management team and has been advised to call with any health related questions or concerns.  ?The care management team will reach out to the patient again over the next 30+ business days. ? ?Kasen Adduci L. [name], RN, BSN, CCM ?[place] Management Care Coordinator ?Office number [phone] ?[facility] number [phone] ?Fax number [phone] ? ? ? ? ? ?

## 2022-04-15 ENCOUNTER — Ambulatory Visit: Payer: Medicare PPO | Admitting: Nurse Practitioner

## 2022-04-15 ENCOUNTER — Ambulatory Visit: Payer: Medicare PPO

## 2022-04-15 DIAGNOSIS — E1165 Type 2 diabetes mellitus with hyperglycemia: Secondary | ICD-10-CM | POA: Diagnosis not present

## 2022-04-16 DIAGNOSIS — E1159 Type 2 diabetes mellitus with other circulatory complications: Secondary | ICD-10-CM | POA: Diagnosis not present

## 2022-04-16 DIAGNOSIS — E1169 Type 2 diabetes mellitus with other specified complication: Secondary | ICD-10-CM | POA: Diagnosis not present

## 2022-04-16 DIAGNOSIS — M810 Age-related osteoporosis without current pathological fracture: Secondary | ICD-10-CM | POA: Diagnosis not present

## 2022-04-16 DIAGNOSIS — E785 Hyperlipidemia, unspecified: Secondary | ICD-10-CM | POA: Diagnosis not present

## 2022-04-16 DIAGNOSIS — Z794 Long term (current) use of insulin: Secondary | ICD-10-CM | POA: Diagnosis not present

## 2022-04-16 DIAGNOSIS — I10 Essential (primary) hypertension: Secondary | ICD-10-CM | POA: Diagnosis not present

## 2022-04-16 DIAGNOSIS — R809 Proteinuria, unspecified: Secondary | ICD-10-CM | POA: Diagnosis not present

## 2022-04-16 DIAGNOSIS — E669 Obesity, unspecified: Secondary | ICD-10-CM | POA: Diagnosis not present

## 2022-04-16 DIAGNOSIS — E1129 Type 2 diabetes mellitus with other diabetic kidney complication: Secondary | ICD-10-CM | POA: Diagnosis not present

## 2022-04-16 DIAGNOSIS — E1142 Type 2 diabetes mellitus with diabetic polyneuropathy: Secondary | ICD-10-CM | POA: Diagnosis not present

## 2022-04-23 DIAGNOSIS — I10 Essential (primary) hypertension: Secondary | ICD-10-CM | POA: Diagnosis not present

## 2022-04-23 DIAGNOSIS — E118 Type 2 diabetes mellitus with unspecified complications: Secondary | ICD-10-CM | POA: Diagnosis not present

## 2022-04-23 DIAGNOSIS — G4733 Obstructive sleep apnea (adult) (pediatric): Secondary | ICD-10-CM | POA: Diagnosis not present

## 2022-04-23 DIAGNOSIS — E782 Mixed hyperlipidemia: Secondary | ICD-10-CM | POA: Diagnosis not present

## 2022-04-23 DIAGNOSIS — I2510 Atherosclerotic heart disease of native coronary artery without angina pectoris: Secondary | ICD-10-CM | POA: Diagnosis not present

## 2022-04-25 DIAGNOSIS — E1165 Type 2 diabetes mellitus with hyperglycemia: Secondary | ICD-10-CM | POA: Diagnosis not present

## 2022-04-25 DIAGNOSIS — E782 Mixed hyperlipidemia: Secondary | ICD-10-CM | POA: Diagnosis not present

## 2022-04-25 DIAGNOSIS — I10 Essential (primary) hypertension: Secondary | ICD-10-CM | POA: Diagnosis not present

## 2022-04-26 DIAGNOSIS — E1165 Type 2 diabetes mellitus with hyperglycemia: Secondary | ICD-10-CM | POA: Diagnosis not present

## 2022-04-30 DIAGNOSIS — I351 Nonrheumatic aortic (valve) insufficiency: Secondary | ICD-10-CM | POA: Diagnosis not present

## 2022-04-30 DIAGNOSIS — R079 Chest pain, unspecified: Secondary | ICD-10-CM | POA: Diagnosis not present

## 2022-04-30 DIAGNOSIS — R0602 Shortness of breath: Secondary | ICD-10-CM | POA: Diagnosis not present

## 2022-04-30 DIAGNOSIS — E669 Obesity, unspecified: Secondary | ICD-10-CM | POA: Diagnosis not present

## 2022-04-30 DIAGNOSIS — E118 Type 2 diabetes mellitus with unspecified complications: Secondary | ICD-10-CM | POA: Diagnosis not present

## 2022-04-30 DIAGNOSIS — I350 Nonrheumatic aortic (valve) stenosis: Secondary | ICD-10-CM | POA: Diagnosis not present

## 2022-04-30 DIAGNOSIS — I10 Essential (primary) hypertension: Secondary | ICD-10-CM | POA: Diagnosis not present

## 2022-04-30 DIAGNOSIS — E782 Mixed hyperlipidemia: Secondary | ICD-10-CM | POA: Diagnosis not present

## 2022-05-13 ENCOUNTER — Ambulatory Visit
Admission: RE | Admit: 2022-05-13 | Discharge: 2022-05-13 | Disposition: A | Discharge status: Home / Self Care | Payer: Medicare HMO | Source: Ambulatory Visit | Attending: Internal Medicine | Admitting: Internal Medicine

## 2022-05-13 DIAGNOSIS — Z1231 Encounter for screening mammogram for malignant neoplasm of breast: Secondary | ICD-10-CM | POA: Diagnosis not present

## 2022-05-14 ENCOUNTER — Other Ambulatory Visit: Payer: Self-pay | Admitting: *Deleted

## 2022-05-14 ENCOUNTER — Encounter: Payer: Self-pay | Admitting: *Deleted

## 2022-05-14 DIAGNOSIS — R079 Chest pain, unspecified: Secondary | ICD-10-CM | POA: Diagnosis not present

## 2022-05-14 NOTE — Patient Outreach (Signed)
[facility]) Care Management Telephonic RN Care Manager Note   05/18/2022 Name:  Joyce Potter MRN:  [id number] DOB:  February 10, 1946  Summary: Follow up outreach Today's concerns with Joyce Potter are related to a change in the behavior/health & caregiver status of her spouse plus her concern related to her cardiac conditions   [name], spouse is now reported to be under hospice care after a fall in the shower and developing pneumonia. She is her husband's primary caregiver. He is now showing symptoms of dementia/memory deficits (which is in his family hx). He has not become violent or aggressive but on 05/13/22 was found attempting to open a container with a knife. Home safety interventions reviewed by spouse's SW. A SW and hospice nurse to visit at 9 am on Monday, 05/17/22. Her Spouse has a Actuary. He is using a walker to ambulate. She has been made aware of respite services. Questions asked about her husband    Patient is scheduled to have an ECHO on 05/17/22. She has followed up with a second cardiac second opinion as she discussed in April 2023 Had stress test done today 05/14/22 She reports she was told she did well except she feel she was shortness of breath. Questions asked about her upcoming ECHO.  Recommendations/Changes made from today's visit: Allowed her to ventilate her feelings  Assessed for pain, numbness or tingling of extremities Encouraged her to remove sharp objects (knives, scissors Discussed Aricept and other medications for dementia Discussed antidepressants, antipsychotics, hypnotics, mood stabilizers and pharmaco genomic testing that assists with determine patient reaction to certain behavior medicines Answered questions she asked about her husband and her ECHO  Subjective: Joyce Potter is an 76 y.o. year old female who is a primary patient of [name], MD. The care management team was consulted for assistance with care management and/or care  coordination needs.    Telephonic RN Care Manager completed Telephone Visit today.   Objective:  Medications Reviewed Today     Reviewed by Drue Dun, RN (Registered Nurse) on 04/13/22 at 1343  Med List Status: <None>   Medication Order Taking? Sig Documenting Provider Last Dose Status Informant  albuterol (VENTOLIN HFA) 108 (90 Base) MCG/ACT inhaler [id number]  Inhale 2 puffs into the lungs every 4 (four) hours as needed for wheezing or shortness of breath.  [provider]  Active Self  alendronate (FOSAMAX) 70 MG tablet [id number]  Take 70 mg by mouth once a week.  [provider]  Active Self           Med Note [name]   Fri May 30, 2020  4:03 PM)    amitriptyline (ELAVIL) 25 MG tablet [id number]  Take 25 mg by mouth at bedtime as needed for sleep.  [provider]  Active Self  aspirin EC 81 MG EC tablet [id number]  Take 1 tablet (81 mg total) by mouth daily. Swallow whole. [name], MD  Active Self  atorvastatin (LIPITOR) 20 MG tablet [id number]  Take 20 mg by mouth daily. [provider]  Active   azithromycin (ZITHROMAX) 250 MG tablet [id number]  Take by mouth. [provider]  Active   baclofen (LIORESAL) 10 MG tablet [id number]  Take by mouth. [provider]  Active   BD INSULIN SYRINGE U/F 31G X 5/16" 1 ML MISC [id number]  USE TWICE DAILY WITH INSULIN [provider]  Active   CELEBREX 100 MG capsule [id number]  Take 100  mg by mouth 2 (two) times daily as needed. [provider]  Active   clopidogrel (PLAVIX) 75 MG tablet [id number]  Take 1 tablet (75 mg total) by mouth daily with breakfast. [name], [name], MD  Active Self  diclofenac Sodium (VOLTAREN) 1 % GEL [id number]  Apply topically. [provider]  Active   Dulaglutide 1.5 MG/0.5ML SOPN [id number]  Inject 1.5 mg into the skin every Saturday.  [provider]  Active   DULoxetine (CYMBALTA) 20 MG capsule [id number]   Take 20 mg by mouth 2 (two) times daily. [provider]  Active   ferrous sulfate 325 (65 FE) MG tablet [id number]  Take by mouth. [provider]  Active   gabapentin (NEURONTIN) 300 MG capsule [id number]  Take 300 mg by mouth 2 (two) times daily.  [provider]  Active Self  indomethacin (INDOCIN) 50 MG capsule [id number]  Take 1 capsule (50 mg total) by mouth 2 (two) times daily with a meal. [name], [name], [name]  Active   insulin NPH-regular Human (NOVOLIN 70/30) (70-30) 100 UNIT/ML injection [id number]  Inject 30 Units into the skin 2 (two) times daily with a meal.  [provider]  Active Self  meloxicam (MOBIC) 15 MG tablet [id number]  Take 15 mg by mouth daily. [provider]  Active   metoCLOPramide (REGLAN) 5 MG tablet [id number]   [provider]  Active   metoprolol tartrate (LOPRESSOR) 100 MG tablet [id number]  Take 100 mg by mouth daily. [provider]  Active Self  Multiple Vitamin (MULTI-VITAMIN) tablet [id number]  Take 1 tablet by mouth daily. [provider]  Active   [facility] VERIO test strip [id number]  [place] Meter [provider]  Active   pantoprazole (PROTONIX) 40 MG tablet [id number]  Take by mouth. [provider]  Active   saccharomyces boulardii (FLORASTOR) 250 MG capsule [id number]  Take 250 mg by mouth 2 (two) times daily.  [provider]  Active Self  SPIRIVA HANDIHALER 18 MCG inhalation capsule [id number]  Place 18 mcg into inhaler and inhale daily in the afternoon.  [provider]  Active Self  traMADol (ULTRAM) 50 MG tablet [id number]  Take 50 mg by mouth every 6 (six) hours as needed for moderate pain.  [provider]  Active Self  Vitamin A 2400 MCG (8000 UT) CAPS [id number]  Take by mouth. [provider]  Active   Med List Note [name], CPhT 05/20/20 1417): Patient stated she did not take her medications this morning as she had to  report here at 0730             SDOH:  (Social Determinants of Health) assessments and interventions performed:    Care Plan  Review of patient past medical history, allergies, medications, health status, including review of consultants reports, laboratory and other test data, was performed as part of comprehensive evaluation for care management services.   Care Plan : RN Care Manager Plan of Care  Updates made by Barbaraann Faster, RN since 05/18/2022 12:00 AM     Problem: Complex Care Coordination Needs and disease management in patient with Chronic Anemia, Diabetes, HTN, AVM   Priority: High     Long-Range Goal: Establish Plan of Care for Management Complex SDOH Barriers, Disease management and Care Coordination Needs in patient with Chronic anema, diabetes, AVM, HTN   Start Date: 10/30/2021  Recent Progress: On track  Priority: High  Note:   Current Barriers:  Knowledge  Deficits related to plan of care for management of CAD, HTN, DMII, and chronic anemia, AVM  Care Coordination needs related to Limited education about chronic anemia, diabetes, HTN and CAD/AVM* 11/18/21 Cares for ill spouse [name] 05/14/22 Spouse now with dementia/memory deficits- hospice care  01/18/22 December 2022 sinusitis, January 2023 cystitis 01/16/22 [facility] ED & dx with acute cystitis, elevation of HgA1c from 5.2 to 7.9   03/18/22 pt with laryngitis brief outreach to allow voice rest, ABX+  RN CM Clinical Goal(s):  Patient will verbalize understanding of plan for management of HTN, DMII, and chronic anemia, CAD/AVM  through collaboration with RN Care manager, provider, and care team.   Interventions: Encouraged patient to outreach as needed to RN CM or 24 hr nurse line  Inter-disciplinary care team collaboration (see longitudinal plan of care) Evaluation of current treatment plan related to  self management and patient's adherence to plan as established by provider 12/16/21 Encouragement provided  related to her management of diabetes and care of her spouse 01/18/22 Iron sucrose/Venofer infusions reviewed for 08/19/21, 08/21/21 & 01/14/22 for the type medicine documented or how administer per pt inquiry Reviewed standard Rights of medication administration  Reviewed the side effects of Venofer (Muscle cramps, nausea, vomiting, strange taste in the mouth, diarrhea, constipation, headache, cough, back pain, joint pain, dizziness, or swelling of the arms/legs may occur. Pain, swelling, or redness at the injection site may occur.) Encouraged her to always call to MD office as soon as possible with any abnormal reactions symptoms Questions answered Reviewed her urinalysis Discussed THN progression 04/01/22 Assisted pt to reset password for [facility] patient portal after a failed attempt to reach the [facility] office [phone] No answer Sent EMMI education on gout via e-mail 05/14/22 answered questions about care of husband now with dementia/memory issues/hospice care   Hypertension Interventions: Goal not addressed 04/01/22 short term Last practice recorded BP readings:  BP Readings from Last 3 Encounters:  04/13/22 131/65  02/19/22 (!) 164/68  01/25/22 (!) 136/49  Most recent eGFR/CrCl: No results found for: EGFR  No components found for: CRCL  Evaluation of current treatment plan related to hypertension self management and patient's adherence to plan as established by provider; Reviewed medications with patient and discussed importance of compliance; 04/01/22 Answered questions for her about her recent cardiac ultrasound, gout, Dr Neoma Laming Discussed mild valvular regurgitation, moderate valvular stenosis, severely thickened, partially mobile leaflets Updated her that signify health does not conflict with [facility] services Reviewed Making healthy lifestyle changes. Taking medications to treat symptoms. Taking blood thinners to reduce the risk of blood clots for atrial fibrillation.  Diabetes  Interventions: goal met 05/14/22 Long term goal HgA1c 6.9 on 04/08/22 12/16/21 recovering from sinus infection/cold with elevations in cbgs voiced concern with possible increase in HgA1c Assessed patient's understanding of A1c goal: <7% Provided education to patient about basic DM disease process; Reviewed medications with patient and discussed importance of medication adherence; Discussed plans with patient for ongoing care management follow up and provided patient with direct contact information for care management team; HgA1c 6.9 on 04/08/22, was 7.9 on 12/15/21, was 5.2 on 08/13/21 per Care everywhere Lab Results  Component Value Date   HGBA1C 7.3 (H) 05/21/2020  HgA1c 6.9 on 04/08/22   CAD/Arteriovenous malformation Interventions: (Status:  New goal.) Long Term Goal Assessed understanding of CAD diagnosis Medications reviewed including medications utilized in CAD treatment plan Provided education on importance of blood pressure control in management of CAD Counseled on importance of regular laboratory monitoring  as prescribed Reviewed Importance of taking all medications as prescribed Reviewed Importance of attending all scheduled provider appointments Screening for signs and symptoms of depression related to chronic disease state Assessed social determinant of health barriers Answered questions about ECHO   Anemia/Bleeding Interventions: goal on track yes Short term goal  Assessment of understanding of anemia/bleeding disorder diagnosis  Basic overview and discussion of anemia/bleeding disorder or acute disease state  Medications reviewed  Counseled on importance of regular laboratory monitoring as directed by provider Provided education about signs and symptoms of active bleeding such as stomach discomfort, coughing up blood or blood tinged secretions, bleeding from the gums/teeth, nosebleeds, increased bruising, blood in the urine/stool and/or if a traumatic injury occurs, regardless  of severity of injury  recommended promotion of rest and energy-conserving measures to manage fatigue, such as balancing activity with periods of rest Assessed social determinant of health barriers  Lab Results  Component Value Date   WBC 5.1 04/12/2022   HGB 12.5 04/12/2022   HCT 38.9 04/12/2022   MCV 86.4 04/12/2022   PLT 243 04/12/2022   Patient Goals/Self-Care Activities: Patient will self administer medications as prescribed Patient will attend all scheduled provider appointments Patient will continue to perform ADL's independently Patient will continue to perform IADL's independently Patient will call provider office for new concerns or questions    Follow Up Plan:  The patient has been provided with contact information for the care management team and has been advised to call with any health related questions or concerns.  The care management team will reach out to the patient again over the next 30+ business days.       Plan: The patient has been provided with contact information for the care management team and has been advised to call with any health related questions or concerns.  The care management team will reach out to the patient again over the next 30+ business days.  Juwann Sherk L. [name], RN, BSN, [place] Coordinator Office number [phone] Main [facility] number [phone] Fax number [phone]

## 2022-05-17 DIAGNOSIS — E669 Obesity, unspecified: Secondary | ICD-10-CM | POA: Diagnosis not present

## 2022-05-17 DIAGNOSIS — R0602 Shortness of breath: Secondary | ICD-10-CM | POA: Diagnosis not present

## 2022-05-17 DIAGNOSIS — E118 Type 2 diabetes mellitus with unspecified complications: Secondary | ICD-10-CM | POA: Diagnosis not present

## 2022-05-17 DIAGNOSIS — R079 Chest pain, unspecified: Secondary | ICD-10-CM | POA: Diagnosis not present

## 2022-05-17 DIAGNOSIS — I340 Nonrheumatic mitral (valve) insufficiency: Secondary | ICD-10-CM | POA: Diagnosis not present

## 2022-05-17 DIAGNOSIS — I351 Nonrheumatic aortic (valve) insufficiency: Secondary | ICD-10-CM | POA: Diagnosis not present

## 2022-05-17 DIAGNOSIS — E782 Mixed hyperlipidemia: Secondary | ICD-10-CM | POA: Diagnosis not present

## 2022-05-19 ENCOUNTER — Other Ambulatory Visit: Payer: Self-pay | Admitting: *Deleted

## 2022-05-19 NOTE — Patient Outreach (Signed)
[facility]) Care Management Telephonic RN Care Manager Note   05/19/2022 Name:  Joyce Potter MRN:  [id number] DOB:  05-Nov-1946  Summary: Follow up outreach [name] reports she had all her test completed to include her ECHO  It noted moderate thicken and she was informed her heart is not relaxing well. She was also informed her heart is beating too fast at night. She reports she experience at night sounds in her ears She is scheduled to see provider on 05/25/22 & return for CT scan of the valve on 05/31/22  She updated that her husband is doing better He is cared for by a sitter when she is at medical appointments and has developed a good rapport with the sitter as of 05/17/22  Recommendations/Changes made from today's visit: Completed follow up  Assessed for updates, her understanding of test/results/ questions etc Answered questions updated Prescription list for metoprolol Commended her on her increase home management and knowledge of her medical needs   Subjective: Joyce Potter is an 76 y.o. year old female who is a primary patient of Joyce Potter. The care management team was consulted for assistance with care management and/or care coordination needs.    Telephonic RN Care Manager completed Telephone Visit today.   Objective:  Medications Reviewed Today     Reviewed by Drue Dun, RN (Registered Nurse) on 04/13/22 at 1343  Med List Status: <None>   Medication Order Taking? Sig Documenting Provider Last Dose Status Informant  albuterol (VENTOLIN HFA) 108 (90 Base) MCG/ACT inhaler [id number]  Inhale 2 puffs into the lungs every 4 (four) hours as needed for wheezing or shortness of breath.  Provider, Historical, Potter  Active Self  alendronate (FOSAMAX) 70 MG tablet [id number]  Take 70 mg by mouth once a week.  Provider, Historical, Potter  Active Self           Med Note Joyce Potter   Fri May 30, 2020  4:03 PM)    amitriptyline (ELAVIL) 25 MG  tablet [id number]  Take 25 mg by mouth at bedtime as needed for sleep.  Provider, Historical, Potter  Active Self  aspirin EC 81 MG EC tablet [id number]  Take 1 tablet (81 mg total) by mouth daily. Swallow whole. Joyce Potter  Active Self  atorvastatin (LIPITOR) 20 MG tablet [id number]  Take 20 mg by mouth daily. Provider, Historical, Potter  Active   azithromycin (ZITHROMAX) 250 MG tablet [id number]  Take by mouth. Provider, Historical, Potter  Active   baclofen (LIORESAL) 10 MG tablet [id number]  Take by mouth. Provider, Historical, Potter  Active   BD INSULIN SYRINGE U/F 31G X 5/16" 1 ML MISC [id number]  USE TWICE DAILY WITH INSULIN Provider, Historical, Potter  Active   CELEBREX 100 MG capsule [id number]  Take 100 mg by mouth 2 (two) times daily as needed. Provider, Historical, Potter  Active   clopidogrel (PLAVIX) 75 MG tablet [id number]  Take 1 tablet (75 mg total) by mouth daily with breakfast. Joyce Potter  Active Self  diclofenac Sodium (VOLTAREN) 1 % GEL [id number]  Apply topically. Provider, Historical, Potter  Active   Dulaglutide 1.5 MG/0.5ML SOPN [id number]  Inject 1.5 mg into the skin every Saturday.  Provider, Historical, Potter  Active   DULoxetine (CYMBALTA) 20 MG capsule [id number]  Take 20 mg by mouth 2 (two) times daily. Provider, Historical, Potter  Active   ferrous sulfate 325 (65 FE) MG tablet [id number]  Take by  mouth. Provider, Historical, Potter  Active   gabapentin (NEURONTIN) 300 MG capsule [id number]  Take 300 mg by mouth 2 (two) times daily.  Provider, Historical, Potter  Active Self  indomethacin (INDOCIN) 50 MG capsule [id number]  Take 1 capsule (50 mg total) by mouth 2 (two) times daily with a meal. [name], Joyce T, Potter  Active   insulin NPH-regular Human (NOVOLIN 70/30) (70-30) 100 UNIT/ML injection [id number]  Inject 30 Units into the skin 2 (two) times daily with a meal.  Provider, Historical, Potter  Active Self  meloxicam (MOBIC) 15 MG tablet [id number]  Take 15 mg by mouth daily. Provider, Historical, Potter   Active   metoCLOPramide (REGLAN) 5 MG tablet [id number]   Provider, Historical, Potter  Active   metoprolol tartrate (LOPRESSOR) 100 MG tablet [id number]  Take 100 mg by mouth daily. Provider, Historical, Potter  Active Self  Multiple Vitamin (MULTI-VITAMIN) tablet [id number]  Take 1 tablet by mouth daily. Provider, Historical, Potter  Active   [facility] VERIO test strip [id number]  [name] Meter Provider, Historical, Potter  Active   pantoprazole (PROTONIX) 40 MG tablet [id number]  Take by mouth. Provider, Historical, Potter  Active   saccharomyces boulardii (FLORASTOR) 250 MG capsule [id number]  Take 250 mg by mouth 2 (two) times daily.  Provider, Historical, Potter  Active Self  SPIRIVA HANDIHALER 18 MCG inhalation capsule [id number]  Place 18 mcg into inhaler and inhale daily in the afternoon.  Provider, Historical, Potter  Active Self  traMADol (ULTRAM) 50 MG tablet [id number]  Take 50 mg by mouth every 6 (six) hours as needed for moderate pain.  Provider, Historical, Potter  Active Self  Vitamin A 2400 MCG (8000 UT) CAPS [id number]  Take by mouth. Provider, Historical, Potter  Active   Med List Note [name], CPhT 05/20/20 1417): Patient stated she did not take her medications this morning as she had to report here at 0730             SDOH:  (Social Determinants of Health) assessments and interventions performed:    Care Plan  Review of patient past medical history, allergies, medications, health status, including review of consultants reports, laboratory and other test data, was performed as part of comprehensive evaluation for care management services.   There are no care plans that you recently modified to display for this patient.    Plan: The patient has been provided with contact information for the care management team and has been advised to call with any health related questions or concerns.  The care management team will reach out to the patient again over the next 30+ business  days.  Joyce Rorke L.  Joyce Hamman, RN, BSN, [place] Coordinator Office number [phone] Main [facility] number [phone] Fax number [phone]

## 2022-05-24 DIAGNOSIS — R072 Precordial pain: Secondary | ICD-10-CM | POA: Diagnosis not present

## 2022-05-26 DIAGNOSIS — E782 Mixed hyperlipidemia: Secondary | ICD-10-CM | POA: Diagnosis not present

## 2022-05-26 DIAGNOSIS — E1165 Type 2 diabetes mellitus with hyperglycemia: Secondary | ICD-10-CM | POA: Diagnosis not present

## 2022-05-26 DIAGNOSIS — I10 Essential (primary) hypertension: Secondary | ICD-10-CM | POA: Diagnosis not present

## 2022-05-27 DIAGNOSIS — E1165 Type 2 diabetes mellitus with hyperglycemia: Secondary | ICD-10-CM | POA: Diagnosis not present

## 2022-05-31 ENCOUNTER — Other Ambulatory Visit: Payer: Self-pay | Admitting: *Deleted

## 2022-05-31 NOTE — Patient Outreach (Signed)
[facility]) Care Management Telephonic RN Care Manager Note   06/01/2022 Name:  Joyce Potter MRN:  [id number] DOB:  05/31/1946  Summary: CT scan of the valve on 05/31/22 was cancelled as the machine Pending rescheduling She is doing well and has not experienced any symptoms    Social  She reports Mr Joyce Potter is doing better  He has gained >5 lbs and completing more  Activities of daily living (ADLs) but continues to be followed by palliative care   They are eating lunch  Recommendations/Changes made from today's visit: Follow up assessment for recent tests No questions today as tests postpone -pending rescheduling  Followed up on social/medical concerns with spouse Discussed future outreach   Subjective: Joyce Potter is an 76 y.o. year old female who is a primary patient of Joyce Maltese, MD. The care management team was consulted for assistance with care management and/or care coordination needs.    Telephonic RN Care Manager completed Telephone Visit today.   Objective:  Medications Reviewed Today     Reviewed by Joyce Faster, RN (Registered Nurse) on 05/19/22 at 1431  Med List Status: <None>   Medication Order Taking? Sig Documenting Provider Last Dose Status Informant  albuterol (VENTOLIN HFA) 108 (90 Base) MCG/ACT inhaler [id number]  Inhale 2 puffs into the lungs every 4 (four) hours as needed for wheezing or shortness of breath.  [provider]  Active Self  alendronate (FOSAMAX) 70 MG tablet [id number]  Take 70 mg by mouth once a week.  [provider]  Active Self           Med Note Joyce Potter   Fri May 30, 2020  4:03 PM)    amitriptyline (ELAVIL) 10 MG tablet [id number]  Take 10 mg by mouth at bedtime as needed. [provider]  Active   amitriptyline (ELAVIL) 25 MG tablet [id number]  Take 25 mg by mouth at bedtime as needed for sleep.  [provider]  Active Self  aspirin EC 81 MG EC tablet [id number]   Take 1 tablet (81 mg total) by mouth daily. Swallow whole. Joyce Dusky, MD  Active Self  atorvastatin (LIPITOR) 20 MG tablet [id number]  Take 20 mg by mouth daily. [provider]  Active   azithromycin (ZITHROMAX) 250 MG tablet [id number]  Take by mouth. [provider]  Active   baclofen (LIORESAL) 10 MG tablet [id number]  Take by mouth. [provider]  Active   BD INSULIN SYRINGE U/F 31G X 5/16" 1 ML MISC [id number]  USE TWICE DAILY WITH INSULIN [provider]  Active   CELEBREX 100 MG capsule [id number]  Take 100 mg by mouth 2 (two) times daily as needed. [provider]  Active   clopidogrel (PLAVIX) 75 MG tablet [id number]  Take 1 tablet (75 mg total) by mouth daily with breakfast. Potter, [name], MD  Active Self  diclofenac Sodium (VOLTAREN) 1 % GEL [id number]  Apply topically. [provider]  Active   Dulaglutide 1.5 MG/0.5ML SOPN [id number]  Inject 1.5 mg into the skin every Saturday.  [provider]  Active   Dulaglutide 3 MG/0.5ML SOPN [id number] Yes Inject into the skin. [provider]  Active   DULoxetine (CYMBALTA) 20 MG capsule [id number]  Take 20 mg by mouth 2 (two) times daily. [provider]  Active   ferrous sulfate 325 (65 FE) MG tablet [id number]  Take by mouth. [provider]  Active  gabapentin (NEURONTIN) 300 MG capsule [id number]  Take 300 mg by mouth 2 (two) times daily.  [provider]  Active Self  heparin lock flush 100 unit/mL [id number]   Joyce Au, NP  Active   indomethacin (INDOCIN) 50 MG capsule [id number]  Take 1 capsule (50 mg total) by mouth 2 (two) times daily with a meal. [name], [name], [name]  Active   insulin NPH-regular Human (NOVOLIN 70/30) (70-30) 100 UNIT/ML injection [id number]  Inject 30 Units into the skin 2 (two) times daily with a meal.  [provider]  Active Self  meloxicam (MOBIC) 15 MG tablet [id number]  Take 15 mg by mouth daily.  [provider]  Active   metoCLOPramide (REGLAN) 5 MG tablet [id number]   [provider]  Active   metoprolol tartrate (LOPRESSOR) 100 MG tablet [id number] No Take 100 mg by mouth daily.  Patient not taking: Reported on 05/19/2022   [provider] Not Taking Active Self           Med Note [name], Mickel Crow   Wed May 19, 2022  2:20 PM) Pt reports her bottle stated 150 mg bid not 100 mg  Multiple Vitamin (MULTI-VITAMIN) tablet [id number]  Take 1 tablet by mouth daily. [provider]  Active   [facility] VERIO test strip [id number]  [place] Meter [provider]  Active   pantoprazole (PROTONIX) 40 MG tablet [id number]  Take by mouth. [provider]  Active   saccharomyces boulardii (FLORASTOR) 250 MG capsule [id number]  Take 250 mg by mouth 2 (two) times daily.  [provider]  Active Self  sodium chloride flush (NS) 0.9 % injection 10 mL [id number]   Joyce Au, NP  Active   [facility] HANDIHALER 18 MCG inhalation capsule [id number]  Place 18 mcg into inhaler and inhale daily in the afternoon.  [provider]  Active Self  traMADol (ULTRAM) 50 MG tablet [id number]  Take 50 mg by mouth every 6 (six) hours as needed for moderate pain.  [provider]  Active Self  Vitamin A 2400 MCG (8000 UT) CAPS [id number]  Take by mouth. [provider]  Active   Med List Note [name], CPhT 05/20/20 1417): Patient stated she did not take her medications this morning as she had to report here at 0730             SDOH:  (Social Determinants of Health) assessments and interventions performed:  SDOH Interventions    Flowsheet Row Most Recent Value  SDOH Interventions   Financial Strain Interventions Intervention Not Indicated  Transportation Interventions Intervention Not Indicated       Care Plan  Review of patient past medical history, allergies, medications, health status, including review of  consultants reports, laboratory and other test data, was performed as part of comprehensive evaluation for care management services.   Care Plan : RN Care Manager Plan of Care  Updates made by Joyce Faster, RN since 06/01/2022 12:00 AM     Problem: Complex Care Coordination Needs and disease management in patient with Chronic Anemia, Diabetes, HTN, AVM   Priority: High     Long-Range Goal: Establish Plan of Care for Management Complex SDOH Barriers, Disease management and Care Coordination Needs in patient with Chronic anema, diabetes, AVM, HTN   Start Date: 10/30/2021  This Visit's Progress: On track  Recent Progress: On track  Priority: High  Note:   Current Barriers:  Knowledge Deficits related to plan  of care for management of CAD, HTN, DMII, and chronic anemia, AVM  Care Coordination needs related to Limited education about chronic anemia, diabetes, HTN and CAD/AVM* 11/18/21 Cares for ill spouse Joyce Potter 05/14/22 Spouse now with dementia/memory deficits- hospice care  01/18/22 December 2022 sinusitis, January 2023 cystitis 01/16/22 [facility] ED & dx with acute cystitis, elevation of HgA1c from 5.2 to 7.9   03/18/22 pt with laryngitis brief outreach to allow voice rest, ABX+  RN CM Clinical Goal(s):  Patient will verbalize understanding of plan for management of HTN, DMII, and chronic anemia, CAD/AVM  through collaboration with RN Care manager, provider, and care team.   Interventions: Encouraged patient to outreach as needed to RN CM or 24 hr nurse line  Inter-disciplinary care team collaboration (see longitudinal plan of care) Evaluation of current treatment plan related to  self management and patient's adherence to plan as established by provider 12/16/21 Encouragement provided related to her management of diabetes and care of her spouse 01/18/22 Iron sucrose/Venofer infusions reviewed for 08/19/21, 08/21/21 & 01/14/22 for the type medicine documented or how administer per pt  inquiry Reviewed standard Rights of medication administration  Reviewed the side effects of Venofer (Muscle cramps, nausea, vomiting, strange taste in the mouth, diarrhea, constipation, headache, cough, back pain, joint pain, dizziness, or swelling of the arms/legs may occur. Pain, swelling, or redness at the injection site may occur.) Encouraged her to always call to MD office as soon as possible with any abnormal reactions symptoms Questions answered Reviewed her urinalysis Discussed THN progression 04/01/22 Assisted pt to reset password for [facility] patient portal after a failed attempt to reach the [facility] office [phone] No answer Sent EMMI education on gout via e-mail 05/14/22 answered questions about care of husband now with dementia/memory issues/hospice care   Hypertension Interventions: Goal not addressed 04/01/22 short term Last practice recorded BP readings:  BP Readings from Last 3 Encounters:  04/13/22 131/65  02/19/22 (!) 164/68  01/25/22 (!) 136/49  Most recent eGFR/CrCl: No results found for: EGFR  No components found for: CRCL  Evaluation of current treatment plan related to hypertension self management and patient's adherence to plan as established by provider; Reviewed medications with patient and discussed importance of compliance; 04/01/22 Answered questions for her about her recent cardiac ultrasound, gout, Dr Neoma Laming Discussed mild valvular regurgitation, moderate valvular stenosis, severely thickened, partially mobile leaflets Updated her that signify health does not conflict with [facility] services Reviewed Making healthy lifestyle changes. Taking medications to treat symptoms. Taking blood thinners to reduce the risk of blood clots for atrial fibrillation.  Diabetes Interventions: goal met 05/14/22 Long term goal HgA1c 6.9 on 04/08/22 12/16/21 recovering from sinus infection/cold with elevations in cbgs voiced concern with possible increase in HgA1c Assessed  patient's understanding of A1c goal: <7% Provided education to patient about basic DM disease process; Reviewed medications with patient and discussed importance of medication adherence; Discussed plans with patient for ongoing care management follow up and provided patient with direct contact information for care management team; HgA1c 6.9 on 04/08/22, was 7.9 on 12/15/21, was 5.2 on 08/13/21 per Care everywhere Lab Results  Component Value Date   HGBA1C 7.3 (H) 05/21/2020  HgA1c 6.9 on 04/08/22   CAD/Arteriovenous malformation Interventions: (Status:  Goal on track:  Yes.) Long Term Goal 05/31/22 CT scan of the valve on 05/31/22 was cancelled as the machine Pending rescheduling She is doing well and has not experienced any symptoms  Assessed understanding of CAD diagnosis Medications reviewed including  medications utilized in CAD treatment plan Provided education on importance of blood pressure control in management of CAD Counseled on importance of regular laboratory monitoring as prescribed Reviewed Importance of taking all medications as prescribed Reviewed Importance of attending all scheduled provider appointments Screening for signs and symptoms of depression related to chronic disease state Assessed social determinant of health barriers Answered questions about ECHO   Anemia/Bleeding Interventions: goal on track yes Short term goal  Assessment of understanding of anemia/bleeding disorder diagnosis  Basic overview and discussion of anemia/bleeding disorder or acute disease state  Medications reviewed  Counseled on importance of regular laboratory monitoring as directed by provider Provided education about signs and symptoms of active bleeding such as stomach discomfort, coughing up blood or blood tinged secretions, bleeding from the gums/teeth, nosebleeds, increased bruising, blood in the urine/stool and/or if a traumatic injury occurs, regardless of severity of injury  recommended  promotion of rest and energy-conserving measures to manage fatigue, such as balancing activity with periods of rest Assessed social determinant of health barriers  Lab Results  Component Value Date   WBC 5.1 04/12/2022   HGB 12.5 04/12/2022   HCT 38.9 04/12/2022   MCV 86.4 04/12/2022   PLT 243 04/12/2022   Patient Goals/Self-Care Activities: Patient will self administer medications as prescribed Patient will attend all scheduled provider appointments Patient will continue to perform ADL's independently Patient will continue to perform IADL's independently Patient will call provider office for new concerns or questions    Follow Up Plan:  The patient has been provided with contact information for the care management team and has been advised to call with any health related questions or concerns.  The care management team will reach out to the patient again over the next 30+ business days.       Plan: The patient has been provided with contact information for the care management team and has been advised to call with any health related questions or concerns.  The care management team will reach out to the patient again over the next 30+ business days.  Laure Leone L. [name], RN, BSN, [place] Coordinator Office number [phone] [facility] number [phone] Fax number [phone]

## 2022-06-26 DIAGNOSIS — E1165 Type 2 diabetes mellitus with hyperglycemia: Secondary | ICD-10-CM | POA: Diagnosis not present

## 2022-06-28 DIAGNOSIS — R072 Precordial pain: Secondary | ICD-10-CM | POA: Diagnosis not present

## 2022-06-30 ENCOUNTER — Other Ambulatory Visit: Payer: Self-pay | Admitting: *Deleted

## 2022-06-30 NOTE — Patient Outreach (Signed)
[facility]) Care Management Telephonic RN Care Manager Note   06/30/2022 Name:  Joyce Potter MRN:  [id number] DOB:  03-12-1946  Summary: Follow up outreach Pt request to return a call to RN CM as she is interacting with a hospice nurse that is visiting her spouse Joyce Potter She express no preferring a change in RN CM but understood the change  She returned a call to RN CM  She confirms she had her CT and is pending results.Not Listed in EPIC  She reports some flushing "red" of her face with the CT dye Reports chest pain is from shoulder to shoulder "like an electric shock"  Recommendations/Changes made from today's visit: Follow up outreach Discussed Cardiac cath, atrial fibrillation with s/s Discussed use of [employer] [facility] to detect Afib Discussed devices that cause concerns with pacemakers Discussed patient will be participating with another [facility] care management coordinator in the future [facility] pod) Discussed diet changes that helps with CAD, CHF Case closure    Subjective: Joyce Potter is an 76 y.o. year old female who is a primary patient of Joyce Potter. The care management team was consulted for assistance with care management and/or care coordination needs.    Telephonic RN Care Manager completed Telephone Visit today.   Objective:  Medications Reviewed Today     Reviewed by Joyce Faster, RN (Registered Nurse) on 05/19/22 at 1431  Med List Status: <None>   Medication Order Taking? Sig Documenting Provider Last Dose Status Informant  albuterol (VENTOLIN HFA) 108 (90 Base) MCG/ACT inhaler [id number]  Inhale 2 puffs into the lungs every 4 (four) hours as needed for wheezing or shortness of breath.  Provider, Historical, Potter  Active Self  alendronate (FOSAMAX) 70 MG tablet [id number]  Take 70 mg by mouth once a week.  Provider, Historical, Potter  Active Self           Med Note [name]   Fri May 30, 2020  4:03 PM)    amitriptyline  (ELAVIL) 10 MG tablet [id number]  Take 10 mg by mouth at bedtime as needed. Provider, Historical, Potter  Active   amitriptyline (ELAVIL) 25 MG tablet [id number]  Take 25 mg by mouth at bedtime as needed for sleep.  Provider, Historical, Potter  Active Self  aspirin EC 81 MG EC tablet [id number]  Take 1 tablet (81 mg total) by mouth daily. Swallow whole. Joyce Potter  Active Self  atorvastatin (LIPITOR) 20 MG tablet [id number]  Take 20 mg by mouth daily. Provider, Historical, Potter  Active   azithromycin (ZITHROMAX) 250 MG tablet [id number]  Take by mouth. Provider, Historical, Potter  Active   baclofen (LIORESAL) 10 MG tablet [id number]  Take by mouth. Provider, Historical, Potter  Active   BD INSULIN SYRINGE U/F 31G X 5/16" 1 ML MISC [id number]  USE TWICE DAILY WITH INSULIN Provider, Historical, Potter  Active   CELEBREX 100 MG capsule [id number]  Take 100 mg by mouth 2 (two) times daily as needed. Provider, Historical, Potter  Active   clopidogrel (PLAVIX) 75 MG tablet [id number]  Take 1 tablet (75 mg total) by mouth daily with breakfast. Joyce Potter  Active Self  diclofenac Sodium (VOLTAREN) 1 % GEL [id number]  Apply topically. Provider, Historical, Potter  Active   Dulaglutide 1.5 MG/0.5ML SOPN [id number]  Inject 1.5 mg into the skin every Saturday.  Provider, Historical, Potter  Active   Dulaglutide 3 MG/0.5ML SOPN [id number] Yes Inject into the skin.  Provider, Historical, Potter  Active   DULoxetine (CYMBALTA) 20 MG capsule [id number]  Take 20 mg by mouth 2 (two) times daily. Provider, Historical, Potter  Active   ferrous sulfate 325 (65 FE) MG tablet [id number]  Take by mouth. Provider, Historical, Potter  Active   gabapentin (NEURONTIN) 300 MG capsule [id number]  Take 300 mg by mouth 2 (two) times daily.  Provider, Historical, Potter  Active Self  heparin lock flush 100 unit/mL [id number]   Joyce Potter  Active   indomethacin (INDOCIN) 50 MG capsule [id number]  Take 1 capsule (50 mg total) by mouth 2 (two) times daily with a  meal. [name], Joyce Potter  Active   insulin NPH-regular Human (NOVOLIN 70/30) (70-30) 100 UNIT/ML injection [id number]  Inject 30 Units into the skin 2 (two) times daily with a meal.  Provider, Historical, Potter  Active Self  meloxicam (MOBIC) 15 MG tablet [id number]  Take 15 mg by mouth daily. Provider, Historical, Potter  Active   metoCLOPramide (REGLAN) 5 MG tablet [id number]   Provider, Historical, Potter  Active   metoprolol tartrate (LOPRESSOR) 100 MG tablet [id number] No Take 100 mg by mouth daily.  Patient not taking: Reported on 05/19/2022   Provider, Historical, Potter Not Taking Active Self           Med Note Joyce Potter   Wed May 19, 2022  2:20 PM) Pt reports her bottle stated 150 mg bid not 100 mg  Multiple Vitamin (MULTI-VITAMIN) tablet [id number]  Take 1 tablet by mouth daily. Provider, Historical, Potter  Active   [facility] VERIO test strip [id number]  [place] Meter Provider, Historical, Potter  Active   pantoprazole (PROTONIX) 40 MG tablet [id number]  Take by mouth. Provider, Historical, Potter  Active   saccharomyces boulardii (FLORASTOR) 250 MG capsule [id number]  Take 250 mg by mouth 2 (two) times daily.  Provider, Historical, Potter  Active Self  sodium chloride flush (NS) 0.9 % injection 10 mL [id number]   Joyce Potter  Active   [facility] HANDIHALER 18 MCG inhalation capsule [id number]  Place 18 mcg into inhaler and inhale daily in the afternoon.  Provider, Historical, Potter  Active Self  traMADol (ULTRAM) 50 MG tablet [id number]  Take 50 mg by mouth every 6 (six) hours as needed for moderate pain.  Provider, Historical, Potter  Active Self  Vitamin A 2400 MCG (8000 UT) CAPS [id number]  Take by mouth. Provider, Historical, Potter  Active   Med List Note Joyce Potter 05/20/20 1417): Patient stated she did not take her medications this morning as she had to report here at 0730             SDOH:  (Social Determinants of Health) assessments and interventions performed:    Care Plan  Review of  patient past medical history, allergies, medications, health status, including review of consultants reports, laboratory and other test data, was performed as part of comprehensive evaluation for care management services.    Care Plan : RN Care Manager Plan of Care  Updates made by Joyce Faster, RN since 08/02/2022 12:00 AM  Completed 08/02/2022   Problem: Complex Care Coordination Needs and disease management in patient with Chronic Anemia, Diabetes, HTN, AVM Resolved 06/30/2022  Priority: High     Long-Range Goal: Establish Plan of Care for Management Complex SDOH Barriers, Disease management and Care Coordination Needs in patient with Chronic anema, diabetes, AVM, HTN Completed 06/30/2022  Start Date: 10/30/2021  This Visit's Progress:  On track  Recent Progress: On track  Priority: High  Note:   Current Barriers:  Knowledge Deficits related to plan of care for management of CAD, HTN, DMII, and chronic anemia, AVM  Care Coordination needs related to Limited education about chronic anemia, diabetes, HTN and CAD/AVM* 11/18/21 Cares for ill spouse Joyce Potter 05/14/22 Spouse now with dementia/memory deficits- hospice care  01/18/22 December 2022 sinusitis, January 2023 cystitis 01/16/22 [facility] ED & dx with acute cystitis, elevation of HgA1c from 5.2 to 7.9   03/18/22 pt with laryngitis brief outreach to allow voice rest, ABX+   RN CM Clinical Goal(s):  Patient will verbalize understanding of plan for management of HTN, DMII, and chronic anemia, CAD/AVM  through collaboration with RN Care manager, provider, and care team.   Interventions: Encouraged patient to outreach as needed to RN CM or 24 hr nurse line  Inter-disciplinary care team collaboration (see longitudinal plan of care) Evaluation of current treatment plan related to  self management and patient's adherence to plan as established by provider 12/16/21 Encouragement provided related to her management of diabetes and care of her  spouse 01/18/22 Iron sucrose/Venofer infusions reviewed for 08/19/21, 08/21/21 & 01/14/22 for the type medicine documented or how administer per pt inquiry Reviewed standard Rights of medication administration  Reviewed the side effects of Venofer (Muscle cramps, nausea, vomiting, strange taste in the mouth, diarrhea, constipation, headache, cough, back pain, joint pain, dizziness, or swelling of the arms/legs may occur. Pain, swelling, or redness at the injection site may occur.) Encouraged her to always call to Potter office as soon as possible with any abnormal reactions symptoms Questions answered Reviewed her urinalysis Discussed THN progression 04/01/22 Assisted pt to reset password for [facility] patient portal after a failed attempt to reach the [facility] office [phone] No answer Sent EMMI education on gout via e-mail 05/14/22 answered questions about care of husband now with dementia/memory issues/hospice care 06/30/22 case closure and resolve of duplicate goal   Hypertension Interventions: Goal on track short term Last practice recorded BP readings:  BP Readings from Last 3 Encounters:  04/13/22 131/65  02/19/22 (!) 164/68  01/25/22 (!) 136/49  Most recent eGFR/CrCl: No results found for: EGFR  No components found for: CRCL  Evaluation of current treatment plan related to hypertension self management and patient's adherence to plan as established by provider; Reviewed medications with patient and discussed importance of compliance; 04/01/22 Answered questions for her about her recent cardiac ultrasound, gout, Dr Neoma Laming Discussed mild valvular regurgitation, moderate valvular stenosis, severely thickened, partially mobile leaflets Updated her that signify health does not conflict with [facility] services Reviewed Making healthy lifestyle changes. Taking medications to treat symptoms. Taking blood thinners to reduce the risk of blood clots for atrial fibrillation.  Diabetes Interventions:  goal met 05/14/22 Long term goal HgA1c 6.9 on 04/08/22 12/16/21 recovering from sinus infection/cold with elevations in cbgs voiced concern with possible increase in HgA1c Assessed patient's understanding of A1c goal: <7% Provided education to patient about basic DM disease process; Reviewed medications with patient and discussed importance of medication adherence; Discussed plans with patient for ongoing care management follow up and provided patient with direct contact information for care management team; HgA1c 6.9 on 04/08/22, was 7.9 on 12/15/21, was 5.2 on 08/13/21 per Care everywhere Lab Results  Component Value Date   HGBA1C 7.3 (H) 05/21/2020  HgA1c 6.9 on 04/08/22   CAD/Arteriovenous malformation Interventions: (Status:  Goal on track:  Yes.) Long Term Goal 05/31/22 CT scan of  the valve on 05/31/22 was cancelled as the machine Pending rescheduling She is doing well and has not experienced any symptoms  Assessed understanding of CAD diagnosis Medications reviewed including medications utilized in CAD treatment plan Provided education on importance of blood pressure control in management of CAD Counseled on importance of regular laboratory monitoring as prescribed Reviewed Importance of taking all medications as prescribed Reviewed Importance of attending all scheduled provider appointments Screening for signs and symptoms of depression related to chronic disease state Assessed social determinant of health barriers Answered questions about ECHO   Anemia/Bleeding Interventions: goal on track yes Short term goal  Assessment of understanding of anemia/bleeding disorder diagnosis  Basic overview and discussion of anemia/bleeding disorder or acute disease state  Medications reviewed  Counseled on importance of regular laboratory monitoring as directed by provider Provided education about signs and symptoms of active bleeding such as stomach discomfort, coughing up blood or blood tinged  secretions, bleeding from the gums/teeth, nosebleeds, increased bruising, blood in the urine/stool and/or if a traumatic injury occurs, regardless of severity of injury  recommended promotion of rest and energy-conserving measures to manage fatigue, such as balancing activity with periods of rest Assessed social determinant of health barriers  Lab Results  Component Value Date   WBC 5.1 04/12/2022   HGB 12.5 04/12/2022   HCT 38.9 04/12/2022   MCV 86.4 04/12/2022   PLT 243 04/12/2022   Patient Goals/Self-Care Activities: Patient will self administer medications as prescribed Patient will attend all scheduled provider appointments Patient will continue to perform ADL's independently Patient will continue to perform IADL's independently Patient will call provider office for new concerns or questions    Follow Up Plan:  The patient has been provided with contact information for the care management team and has been advised to call with any health related questions or concerns.  06/30/22 case closure and resolve of duplicate goal          Plan: The patient has been provided with contact information for the care management team and has been advised to call with any health related questions or concerns.  Case closure patient will be participating with another [facility] care management coordinator in the future ([place]. Joyce Hamman, RN, BSN, [place] Coordinator Office number [phone] Main [facility] number [phone] Fax number [phone]

## 2022-07-05 ENCOUNTER — Ambulatory Visit: Payer: Self-pay | Admitting: Cardiology

## 2022-07-05 DIAGNOSIS — R079 Chest pain, unspecified: Secondary | ICD-10-CM | POA: Diagnosis not present

## 2022-07-05 DIAGNOSIS — I2510 Atherosclerotic heart disease of native coronary artery without angina pectoris: Secondary | ICD-10-CM | POA: Diagnosis not present

## 2022-07-05 DIAGNOSIS — E782 Mixed hyperlipidemia: Secondary | ICD-10-CM | POA: Diagnosis not present

## 2022-07-05 DIAGNOSIS — E118 Type 2 diabetes mellitus with unspecified complications: Secondary | ICD-10-CM | POA: Diagnosis not present

## 2022-07-05 DIAGNOSIS — I340 Nonrheumatic mitral (valve) insufficiency: Secondary | ICD-10-CM | POA: Diagnosis not present

## 2022-07-05 DIAGNOSIS — R0602 Shortness of breath: Secondary | ICD-10-CM | POA: Diagnosis not present

## 2022-07-05 DIAGNOSIS — E669 Obesity, unspecified: Secondary | ICD-10-CM | POA: Diagnosis not present

## 2022-07-05 DIAGNOSIS — I10 Essential (primary) hypertension: Secondary | ICD-10-CM | POA: Diagnosis not present

## 2022-07-05 MED ORDER — SODIUM CHLORIDE 0.9% FLUSH
3.0000 mL | Freq: Two times a day (BID) | INTRAVENOUS | Status: AC
  Filled 2022-07-05: qty 3

## 2022-07-06 ENCOUNTER — Inpatient Hospital Stay: Payer: Medicare HMO | Attending: Oncology

## 2022-07-06 DIAGNOSIS — I10 Essential (primary) hypertension: Secondary | ICD-10-CM | POA: Insufficient documentation

## 2022-07-06 DIAGNOSIS — K921 Melena: Secondary | ICD-10-CM | POA: Diagnosis not present

## 2022-07-06 DIAGNOSIS — Z87891 Personal history of nicotine dependence: Secondary | ICD-10-CM | POA: Diagnosis not present

## 2022-07-06 DIAGNOSIS — I4891 Unspecified atrial fibrillation: Secondary | ICD-10-CM | POA: Diagnosis not present

## 2022-07-06 DIAGNOSIS — Z79899 Other long term (current) drug therapy: Secondary | ICD-10-CM | POA: Diagnosis not present

## 2022-07-06 DIAGNOSIS — D500 Iron deficiency anemia secondary to blood loss (chronic): Secondary | ICD-10-CM | POA: Diagnosis not present

## 2022-07-06 LAB — IRON AND TIBC
Iron: 80 ug/dL (ref 28–170)
Saturation Ratios: 25 % (ref 10.4–31.8)
TIBC: 316 ug/dL (ref 250–450)
UIBC: 236 ug/dL

## 2022-07-06 LAB — CBC WITH DIFFERENTIAL/PLATELET
Abs Immature Granulocytes: 0.01 10*3/uL (ref 0.00–0.07)
Basophils Absolute: 0.1 10*3/uL (ref 0.0–0.1)
Basophils Relative: 1 %
Eosinophils Absolute: 0.3 10*3/uL (ref 0.0–0.5)
Eosinophils Relative: 6 %
HCT: 40.1 % (ref 36.0–46.0)
Hemoglobin: 12.8 g/dL (ref 12.0–15.0)
Immature Granulocytes: 0 %
Lymphocytes Relative: 27 %
Lymphs Abs: 1.4 10*3/uL (ref 0.7–4.0)
MCH: 28.1 pg (ref 26.0–34.0)
MCHC: 31.9 g/dL (ref 30.0–36.0)
MCV: 88.1 fL (ref 80.0–100.0)
Monocytes Absolute: 0.5 10*3/uL (ref 0.1–1.0)
Monocytes Relative: 11 %
Neutro Abs: 2.7 10*3/uL (ref 1.7–7.7)
Neutrophils Relative %: 55 %
Platelets: 279 10*3/uL (ref 150–400)
RBC: 4.55 MIL/uL (ref 3.87–5.11)
RDW: 13 % (ref 11.5–15.5)
WBC: 5 10*3/uL (ref 4.0–10.5)
nRBC: 0 % (ref 0.0–0.2)

## 2022-07-06 LAB — FERRITIN: Ferritin: 46 ng/mL (ref 11–307)

## 2022-07-08 ENCOUNTER — Other Ambulatory Visit: Payer: Self-pay

## 2022-07-08 ENCOUNTER — Encounter
Admission: RE | Disposition: A | Discharge status: Home / Self Care | Payer: Self-pay | Source: Home / Self Care | Attending: Cardiovascular Disease

## 2022-07-08 ENCOUNTER — Ambulatory Visit
Admission: RE | Admit: 2022-07-08 | Discharge: 2022-07-08 | Disposition: A | Discharge status: Home / Self Care | Payer: Medicare HMO | Attending: Cardiovascular Disease | Admitting: Cardiovascular Disease

## 2022-07-08 ENCOUNTER — Encounter: Payer: Self-pay | Admitting: Cardiovascular Disease

## 2022-07-08 DIAGNOSIS — I200 Unstable angina: Secondary | ICD-10-CM | POA: Diagnosis not present

## 2022-07-08 DIAGNOSIS — R079 Chest pain, unspecified: Secondary | ICD-10-CM | POA: Insufficient documentation

## 2022-07-08 DIAGNOSIS — I2510 Atherosclerotic heart disease of native coronary artery without angina pectoris: Secondary | ICD-10-CM | POA: Insufficient documentation

## 2022-07-08 HISTORY — PX: LEFT HEART CATH AND CORONARY ANGIOGRAPHY: CATH118249

## 2022-07-08 LAB — GLUCOSE, CAPILLARY: Glucose-Capillary: 110 mg/dL — ABNORMAL HIGH (ref 70–99)

## 2022-07-08 SURGERY — LEFT HEART CATH AND CORONARY ANGIOGRAPHY
Anesthesia: Moderate Sedation

## 2022-07-08 MED ORDER — ACETAMINOPHEN 325 MG PO TABS: 650.0000 mg | ORAL_TABLET | ORAL | Status: DC | PRN

## 2022-07-08 MED ORDER — SODIUM CHLORIDE 0.9% FLUSH: 3.0000 mL | INTRAVENOUS | Status: DC | PRN

## 2022-07-08 MED ORDER — SODIUM CHLORIDE 0.9% FLUSH: 3.0000 mL | Freq: Two times a day (BID) | INTRAVENOUS | Status: DC

## 2022-07-08 MED ORDER — SODIUM CHLORIDE 0.9 % WEIGHT BASED INFUSION: 1.0000 mL/kg/h | INTRAVENOUS | Status: DC

## 2022-07-08 MED ORDER — ASPIRIN 81 MG PO CHEW
CHEWABLE_TABLET | ORAL | Status: AC
  Administered 2022-07-08: 81 mg
  Filled 2022-07-08: qty 1

## 2022-07-08 MED ORDER — IOHEXOL 300 MG/ML  SOLN
INTRAMUSCULAR | Status: DC | PRN
  Administered 2022-07-08: 50 mL via INTRA_ARTERIAL

## 2022-07-08 MED ORDER — MIDAZOLAM HCL 2 MG/2ML IJ SOLN
INTRAMUSCULAR | Status: AC
  Filled 2022-07-08: qty 2

## 2022-07-08 MED ORDER — MIDAZOLAM HCL 2 MG/2ML IJ SOLN
INTRAMUSCULAR | Status: DC | PRN
  Administered 2022-07-08: 1 mg via INTRAVENOUS

## 2022-07-08 MED ORDER — FENTANYL CITRATE (PF) 100 MCG/2ML IJ SOLN
INTRAMUSCULAR | Status: AC
  Filled 2022-07-08: qty 2

## 2022-07-08 MED ORDER — LABETALOL HCL 5 MG/ML IV SOLN: 10.0000 mg | INTRAVENOUS | Status: DC | PRN

## 2022-07-08 MED ORDER — LIDOCAINE HCL (PF) 1 % IJ SOLN
INTRAMUSCULAR | Status: DC | PRN
  Administered 2022-07-08: 20 mL via SUBCUTANEOUS

## 2022-07-08 MED ORDER — FENTANYL CITRATE (PF) 100 MCG/2ML IJ SOLN
INTRAMUSCULAR | Status: DC | PRN
  Administered 2022-07-08: 50 ug via INTRAVENOUS

## 2022-07-08 MED ORDER — ONDANSETRON HCL 4 MG/2ML IJ SOLN: 4.0000 mg | Freq: Four times a day (QID) | INTRAMUSCULAR | Status: DC | PRN

## 2022-07-08 MED ORDER — HYDRALAZINE HCL 20 MG/ML IJ SOLN: 10.0000 mg | INTRAMUSCULAR | Status: DC | PRN

## 2022-07-08 MED ORDER — HEPARIN (PORCINE) IN NACL 1000-0.9 UT/500ML-% IV SOLN
INTRAVENOUS | Status: AC
  Filled 2022-07-08: qty 1000

## 2022-07-08 MED ORDER — SODIUM CHLORIDE 0.9 % WEIGHT BASED INFUSION
3.0000 mL/kg/h | INTRAVENOUS | Status: DC
  Administered 2022-07-08: 3 mL/kg/h via INTRAVENOUS

## 2022-07-08 MED ORDER — LIDOCAINE HCL 1 % IJ SOLN
INTRAMUSCULAR | Status: AC
  Filled 2022-07-08: qty 20

## 2022-07-08 MED ORDER — SODIUM CHLORIDE 0.9 % IV SOLN: 250.0000 mL | INTRAVENOUS | Status: DC | PRN

## 2022-07-08 MED ORDER — HEPARIN (PORCINE) IN NACL 1000-0.9 UT/500ML-% IV SOLN
INTRAVENOUS | Status: DC | PRN
  Administered 2022-07-08 (×2): 500 mL

## 2022-07-08 MED ORDER — SODIUM CHLORIDE 0.9 % IV SOLN
250.0000 mL | INTRAVENOUS | Status: DC | PRN
Start: 2022-07-08 — End: 2022-07-08

## 2022-07-08 MED ORDER — ASPIRIN 81 MG PO CHEW: 81.0000 mg | CHEWABLE_TABLET | ORAL | Status: DC

## 2022-07-08 SURGICAL SUPPLY — 10 items
CATH INFINITI 5FR MULTPACK ANG (CATHETERS) ×1 IMPLANT
DEVICE CLOSURE MYNXGRIP 5F (Vascular Products) ×1 IMPLANT
NDL PERC 18GX7CM (NEEDLE) IMPLANT
NEEDLE PERC 18GX7CM (NEEDLE) ×2 IMPLANT
PACK CARDIAC CATH (CUSTOM PROCEDURE TRAY) ×2 IMPLANT
PROTECTION STATION PRESSURIZED (MISCELLANEOUS) ×2
SET ATX SIMPLICITY (MISCELLANEOUS) ×1 IMPLANT
SHEATH AVANTI 5FR X 11CM (SHEATH) ×1 IMPLANT
STATION PROTECTION PRESSURIZED (MISCELLANEOUS) IMPLANT
WIRE GUIDERIGHT .035X150 (WIRE) ×1 IMPLANT

## 2022-07-13 ENCOUNTER — Ambulatory Visit: Payer: Medicare HMO | Admitting: Oncology

## 2022-07-13 ENCOUNTER — Inpatient Hospital Stay (HOSPITAL_BASED_OUTPATIENT_CLINIC_OR_DEPARTMENT_OTHER): Payer: Medicare HMO | Admitting: Oncology

## 2022-07-13 ENCOUNTER — Encounter: Payer: Self-pay | Admitting: Oncology

## 2022-07-13 ENCOUNTER — Inpatient Hospital Stay: Payer: Medicare HMO

## 2022-07-13 VITALS — BP 123/68 | HR 70 | Resp 18 | Wt 193.0 lb

## 2022-07-13 DIAGNOSIS — K921 Melena: Secondary | ICD-10-CM | POA: Diagnosis not present

## 2022-07-13 DIAGNOSIS — D500 Iron deficiency anemia secondary to blood loss (chronic): Secondary | ICD-10-CM

## 2022-07-13 DIAGNOSIS — I4891 Unspecified atrial fibrillation: Secondary | ICD-10-CM | POA: Diagnosis not present

## 2022-07-13 DIAGNOSIS — I10 Essential (primary) hypertension: Secondary | ICD-10-CM | POA: Diagnosis not present

## 2022-07-13 DIAGNOSIS — E782 Mixed hyperlipidemia: Secondary | ICD-10-CM | POA: Diagnosis not present

## 2022-07-13 DIAGNOSIS — Z79899 Other long term (current) drug therapy: Secondary | ICD-10-CM | POA: Diagnosis not present

## 2022-07-13 DIAGNOSIS — I340 Nonrheumatic mitral (valve) insufficiency: Secondary | ICD-10-CM | POA: Diagnosis not present

## 2022-07-13 DIAGNOSIS — R079 Chest pain, unspecified: Secondary | ICD-10-CM | POA: Diagnosis not present

## 2022-07-13 DIAGNOSIS — E669 Obesity, unspecified: Secondary | ICD-10-CM | POA: Diagnosis not present

## 2022-07-13 DIAGNOSIS — E118 Type 2 diabetes mellitus with unspecified complications: Secondary | ICD-10-CM | POA: Diagnosis not present

## 2022-07-13 DIAGNOSIS — I2510 Atherosclerotic heart disease of native coronary artery without angina pectoris: Secondary | ICD-10-CM | POA: Diagnosis not present

## 2022-07-13 DIAGNOSIS — Z87891 Personal history of nicotine dependence: Secondary | ICD-10-CM | POA: Diagnosis not present

## 2022-07-13 MED FILL — Iron Sucrose Inj 20 MG/ML (Fe Equiv): INTRAVENOUS | Qty: 10 | Status: AC

## 2022-07-13 NOTE — Progress Notes (Signed)
[place]  [phone] [phone]  ID: [name] OB: 03-Aug-1946  MR#: [id number]  NKN#:[id number]  Patient Care Team: [name], MD as PCP - General (Internal Medicine) Lloyd Huger, MD as Consulting Physician (Hematology and Oncology) Barbaraann Faster, RN as [place] Fath, Javier Docker, MD as Consulting Physician (Cardiology) Solum, Betsey Holiday, MD as Physician Assistant (Endocrinology) Virgel Manifold, MD (Inactive) as Consulting Physician (Gastroenterology) Jacquelin Hawking, NP as Nurse Practitioner (Oncology) Birder Robson, MD as Referring Physician (Ophthalmology)  CHIEF COMPLAINT: Iron deficiency anemia, secondary to chronic blood loss.  INTERVAL HISTORY: [name] is a 76 year old female who presents today for follow-up for iron deficiency anemia.  Last received IV iron in January 2023.  Unable to tolerate oral iron.  Denies any recent bleeding.  States she knows when her iron levels get low due to palpitations and feeling dizzy.  Has not noticed any dark tarry stools.  Has baseline fatigue secondary to atrial fibrillation.  Appetite has been stable.  REVIEW OF SYSTEMS:   Review of Systems  Constitutional:  Positive for malaise/fatigue. Negative for chills, fever and weight loss.  HENT:  Negative for hearing loss, nosebleeds, sore throat and tinnitus.   Eyes:  Negative for blurred vision and double vision.  Respiratory:  Negative for cough, hemoptysis, shortness of breath and wheezing.   Cardiovascular:  Positive for palpitations. Negative for chest pain and leg swelling.  Gastrointestinal:  Negative for abdominal pain, blood in stool, constipation, diarrhea, melena, nausea and vomiting.  Genitourinary:  Negative for dysuria, flank pain, hematuria and urgency.  Musculoskeletal:  Negative for back pain, falls, joint pain and myalgias.  Skin:  Negative for itching and rash.   Neurological:  Negative for dizziness, tingling, sensory change, loss of consciousness, weakness and headaches.  Endo/Heme/Allergies:  Negative for environmental allergies. Does not bruise/bleed easily.  Psychiatric/Behavioral:  Negative for depression. The patient is not nervous/anxious and does not have insomnia.    As per HPI. Otherwise, a complete review of systems is negative.  PAST MEDICAL HISTORY: Past Medical History:  Diagnosis Date   Anemia    iron deficiency   Aortic stenosis    Basal cell carcinoma    CAD (coronary artery disease)    s/p Left circumflex stent in 2012   Carotid stenosis    Cataract    High cholesterol    HTN (hypertension)    Hyperlipidemia    IDDM (insulin dependent diabetes mellitus)    Multiple thyroid nodules    Benign   Peptic ulcer disease    Retinal artery occlusion    on left    PAST SURGICAL HISTORY: Past Surgical History:  Procedure Laterality Date   ABDOMINAL HYSTERECTOMY     ARTERY BIOPSY Left 11/19/2015   Procedure: BIOPSY TEMPORAL ARTERY;  Surgeon: Algernon Huxley, MD;  Location: [facility] ORS;  Service: Vascular;  Laterality: Left;   BREAST BIOPSY Left 2002   core- neg   BREAST EXCISIONAL BIOPSY     ??? not visible scar on neither breast- pt believes it to be right breast    CARDIAC CATHETERIZATION N/A 08/08/2015   Procedure: Right and Left Heart Cath and Coronary Angiography;  Surgeon: Teodoro Spray, MD;  Location: [place] [facility];  Service: Cardiovascular;  Laterality: N/A;   CARDIAC CATHETERIZATION Bilateral 09/03/2016   Procedure: Right/Left Heart Cath and Coronary Angiography;  Surgeon: Teodoro Spray, MD;  Location: [place] [facility];  Service:  Cardiovascular;  Laterality: Bilateral;   CATARACT EXTRACTION W/ INTRAOCULAR LENS  IMPLANT, BILATERAL     COLONOSCOPY     in 2013- internal hemorrhoids   COLONOSCOPY WITH PROPOFOL N/A 07/07/2018   Procedure: COLONOSCOPY WITH PROPOFOL;  Surgeon: Manya Silvas, MD;  Location: [facility]  ENDOSCOPY;  Service: Endoscopy;  Laterality: N/A;   COLONOSCOPY WITH PROPOFOL N/A 05/22/2020   Procedure: COLONOSCOPY WITH PROPOFOL;  Surgeon: Jonathon Bellows, MD;  Location: [facility] ENDOSCOPY;  Service: Gastroenterology;  Laterality: N/A;   CORONARY ANGIOGRAPHY N/A 09/14/2017   Procedure: CORONARY ANGIOGRAPHY;  Surgeon: Teodoro Spray, MD;  Location: [place] [facility];  Service: Cardiovascular;  Laterality: N/A;   CORONARY ANGIOPLASTY     CORONARY STENT INTERVENTION N/A 05/15/2020   Procedure: coronary angiography;  Surgeon: Teodoro Spray, MD;  Location: [place] [facility];  Service: Cardiovascular;  Laterality: N/A;   CORONARY STENT INTERVENTION N/A 05/15/2020   Procedure: CORONARY STENT INTERVENTION;  Surgeon: Isaias Cowman, MD;  Location: [place] [facility];  Service: Cardiovascular;  Laterality: N/A;   CORONARY STENT PLACEMENT     ENDARTERECTOMY Left 10/01/2015   Procedure: ENDARTERECTOMY CAROTID;  Surgeon: Algernon Huxley, MD;  Location: [facility] ORS;  Service: Vascular;  Laterality: Left;   ENTEROSCOPY N/A 07/24/2020   Procedure: ENTEROSCOPY;  Surgeon: Virgel Manifold, MD;  Location: [facility] ENDOSCOPY;  Service: Endoscopy;  Laterality: N/A;   ESOPHAGOGASTRODUODENOSCOPY     ESOPHAGOGASTRODUODENOSCOPY (EGD) WITH PROPOFOL N/A 05/31/2016   Procedure: ESOPHAGOGASTRODUODENOSCOPY (EGD) WITH PROPOFOL;  Surgeon: Manya Silvas, MD;  Location[facility] ENDOSCOPY;  Service: Endoscopy;  Laterality: N/A;   ESOPHAGOGASTRODUODENOSCOPY (EGD) WITH PROPOFOL N/A 07/07/2018   Procedure: ESOPHAGOGASTRODUODENOSCOPY (EGD) WITH PROPOFOL;  Surgeon: Manya Silvas, MD;  Location[facility];  Service: Endoscopy;  Laterality: N/A;   ESOPHAGOGASTRODUODENOSCOPY (EGD) WITH PROPOFOL N/A 07/17/2018   Procedure: ESOPHAGOGASTRODUODENOSCOPY (EGD) WITH PROPOFOL;  Surgeon: Lucilla Lame, MD;  Location: [facility] ENDOSCOPY;  Service: Endoscopy;  Laterality: N/A;   ESOPHAGOGASTRODUODENOSCOPY (EGD) WITH PROPOFOL N/A 05/21/2020    Procedure: ESOPHAGOGASTRODUODENOSCOPY (EGD) WITH PROPOFOL;  Surgeon: Jonathon Bellows, MD;  Location: [facility] ENDOSCOPY;  Service: Gastroenterology;  Laterality: N/A;   ESOPHAGOGASTRODUODENOSCOPY (EGD) WITH PROPOFOL N/A 06/13/2020   Procedure: ESOPHAGOGASTRODUODENOSCOPY (EGD) WITH PROPOFOL;  Surgeon: Lucilla Lame, MD;  Location: [facility] ENDOSCOPY;  Service: Endoscopy;  Laterality: N/A;   EYE SURGERY     GIVENS CAPSULE STUDY N/A 04/17/2013   Procedure: GIVENS CAPSULE STUDY;  Surgeon: Arta Silence, MD;  Location: [facility] ENDOSCOPY;  Service: Endoscopy;  Laterality: N/A;  patient ate breakfast at [place] 06/11/2020   Procedure: GIVENS CAPSULE STUDY;  Surgeon: Virgel Manifold, MD;  Location: [facility] ENDOSCOPY;  Service: Endoscopy;  Laterality: N/A;   GIVENS CAPSULE STUDY N/A 01/29/2021   Procedure: GIVENS CAPSULE STUDY;  Surgeon: Virgel Manifold, MD;  Location: [facility] ENDOSCOPY;  Service: Endoscopy;  Laterality: N/A;   GIVENS CAPSULE STUDY N/A 08/24/2021   Procedure: GIVENS CAPSULE STUDY;  Surgeon: Virgel Manifold, MD;  Location: [facility] ENDOSCOPY;  Service: Endoscopy;  Laterality: N/A;   HEMORRHOID BANDING  07/27/2016   Dr Nicholes Stairs   LEFT HEART CATH AND CORONARY ANGIOGRAPHY N/A 07/08/2022   Procedure: LEFT HEART CATH AND CORONARY ANGIOGRAPHY;  Surgeon: Dionisio David, MD;  Location: [place] [facility];  Service: Cardiovascular;  Laterality: N/A;   PARTIAL HYSTERECTOMY     RIGHT AND LEFT HEART CATH Bilateral 09/14/2017   Procedure: RIGHT AND LEFT HEART CATH;  Surgeon: Teodoro Spray,  MD;  Location: [place] [facility];  Service: Cardiovascular;  Laterality: Bilateral;   RIGHT/LEFT HEART CATH AND CORONARY ANGIOGRAPHY Right 05/15/2020   Procedure: Right/Left Heart cath;  Surgeon: Teodoro Spray, MD;  Location: [place] [facility];  Service: Cardiovascular;  Laterality: Right;   TRIGGER FINGER RELEASE      FAMILY HISTORY Family History  Problem Relation Age of Onset   Uterine  cancer Mother    Breast cancer Mother 58   Seizures Father    Stroke Father    Diabetes Father    COPD Father    Colon cancer Neg Hx       ADVANCED DIRECTIVES:    HEALTH MAINTENANCE: Social History   Tobacco Use   Smoking status: Former    Types: Cigarettes    Quit date: 02/15/1985    Years since quitting: 37.4   Smokeless tobacco: Never   Tobacco comments:    quit about 31 years ago  Vaping Use   Vaping Use: Never used  Substance Use Topics   Alcohol use: No    Alcohol/week: 0.0 standard drinks of alcohol   Drug use: No    Colonoscopy:  PAP:  Bone density: Osteoporosis- DEXA 06/2017 (on fosamax)  Lipid panel:  Allergies  Allergen Reactions   Invokamet [Canagliflozin-Metformin Hcl] Other (See Comments)    Yeast Infection   Lovastatin Rash   Penicillins Rash    Has patient had a PCN reaction causing immediate rash, facial/tongue/throat swelling, SOB or lightheadedness with hypotension:Yes Has patient had a PCN reaction causing severe rash involving mucus membranes or skin necrosis:all over body Has patient had a PCN reaction that required hospitalization: No Has patient had a PCN reaction occurring within the last 10 years: No If all of the above answers are "NO", then may proceed with Cephalosporin use.    Vicodin [Hydrocodone-Acetaminophen] Rash    Current Outpatient Medications  Medication Sig Dispense Refill   albuterol (VENTOLIN HFA) 108 (90 Base) MCG/ACT inhaler Inhale 2 puffs into the lungs every 4 (four) hours as needed for wheezing or shortness of breath.      alendronate (FOSAMAX) 70 MG tablet Take 70 mg by mouth once a week.      amitriptyline (ELAVIL) 25 MG tablet Take 25 mg by mouth at bedtime as needed for sleep.      aspirin EC 81 MG EC tablet Take 1 tablet (81 mg total) by mouth daily. Swallow whole. 30 tablet 11   atorvastatin (LIPITOR) 20 MG tablet Take 20 mg by mouth daily.     baclofen (LIORESAL) 10 MG tablet Take 10 mg by mouth 4 (four) times  daily as needed.     CELEBREX 100 MG capsule Take 100 mg by mouth 2 (two) times daily as needed.     clopidogrel (PLAVIX) 75 MG tablet Take 1 tablet (75 mg total) by mouth daily with breakfast. 30 tablet 11   Dulaglutide 3 MG/0.5ML SOPN Inject 3 mLs into the skin once a week. Every saturday     DULoxetine (CYMBALTA) 20 MG capsule Take 20 mg by mouth 2 (two) times daily.     insulin NPH-regular Human (NOVOLIN 70/30) (70-30) 100 UNIT/ML injection Inject 30 Units into the skin with breakfast, with lunch, and with evening meal.     losartan (COZAAR) 25 MG tablet Take 25 mg by mouth daily.     meloxicam (MOBIC) 15 MG tablet Take 15 mg by mouth daily.     metoCLOPramide (REGLAN) 5 MG tablet Take  5 mg by mouth daily.     metoprolol tartrate (LOPRESSOR) 100 MG tablet Take 100 mg by mouth 2 (two) times daily.     Multiple Vitamin (MULTI-VITAMIN) tablet Take 1 tablet by mouth daily.     pantoprazole (PROTONIX) 40 MG tablet Take 40 mg by mouth daily.     saccharomyces boulardii (FLORASTOR) 250 MG capsule Take 250 mg by mouth 2 (two) times daily.      Vitamin A 2400 MCG (8000 UT) CAPS Take by mouth.     azithromycin (ZITHROMAX) 250 MG tablet Take by mouth. (Patient not taking: Reported on 07/08/2022)     BD INSULIN SYRINGE U/F 31G X 5/16" 1 ML MISC USE TWICE DAILY WITH INSULIN (Patient not taking: Reported on 07/13/2022)     ferrous sulfate 325 (65 FE) MG tablet Take by mouth. (Patient not taking: Reported on 07/13/2022)     gabapentin (NEURONTIN) 300 MG capsule Take 300 mg by mouth 2 (two) times daily.  (Patient not taking: Reported on 07/08/2022)     indomethacin (INDOCIN) 50 MG capsule Take 1 capsule (50 mg total) by mouth 2 (two) times daily with a meal. (Patient not taking: Reported on 07/08/2022) 60 capsule 1   ONETOUCH VERIO test strip SMARTSIG:Via Meter (Patient not taking: Reported on 07/13/2022)     SPIRIVA HANDIHALER 18 MCG inhalation capsule Place 18 mcg into inhaler and inhale daily in the afternoon.   (Patient not taking: Reported on 07/08/2022)     traMADol (ULTRAM) 50 MG tablet Take 50 mg by mouth every 6 (six) hours as needed for moderate pain.  (Patient not taking: Reported on 07/08/2022)     No current facility-administered medications for this visit.   Facility-Administered Medications Ordered in Other Visits  Medication Dose Route Frequency Provider Last Rate Last Admin   heparin lock flush 100 unit/mL  500 Units Intravenous Once Verlon Au, NP       sodium chloride flush (NS) 0.9 % injection 10 mL  10 mL Intravenous Once Verlon Au, NP       sodium chloride flush (NS) 0.9 % injection 3 mL  3 mL Intravenous Q12H Scoggins, Amber, NP        OBJECTIVE: Vitals:   07/13/22 1355  BP: 123/68  Pulse: 70  Resp: 18  SpO2: 96%     Body mass index is 34.19 kg/m.    ECOG FS:0 - Asymptomatic  Physical Exam Constitutional:      Appearance: Normal appearance.  HENT:     Head: Normocephalic and atraumatic.  Eyes:     Pupils: Pupils are equal, round, and reactive to light.  Cardiovascular:     Rate and Rhythm: Normal rate and regular rhythm.     Heart sounds: Normal heart sounds. No murmur heard. Pulmonary:     Effort: Pulmonary effort is normal.     Breath sounds: Normal breath sounds. No wheezing.  Abdominal:     General: Bowel sounds are normal. There is no distension.     Palpations: Abdomen is soft.     Tenderness: There is no abdominal tenderness.  Musculoskeletal:        General: Normal range of motion.     Cervical back: Normal range of motion.  Skin:    General: Skin is warm and dry.     Findings: No rash.  Neurological:     Mental Status: She is alert and oriented to person, place, and time.  Psychiatric:  Judgment: Judgment normal.      LAB RESULTS:  Lab Results  Component Value Date   NA 141 07/24/2020   K 4.1 07/24/2020   CL 104 07/24/2020   CO2 26 07/24/2020   GLUCOSE 102 (H) 07/24/2020   BUN 8 07/24/2020   CREATININE 0.79 07/24/2020    CALCIUM 8.5 (L) 07/24/2020   PROT 7.0 07/21/2020   ALBUMIN 3.8 07/21/2020   AST 18 07/21/2020   ALT 14 07/21/2020   ALKPHOS 72 07/21/2020   BILITOT 0.6 07/21/2020   GFRNONAA >60 07/24/2020   GFRAA >60 07/24/2020    Lab Results  Component Value Date   WBC 5.0 07/06/2022   NEUTROABS 2.7 07/06/2022   HGB 12.8 07/06/2022   HCT 40.1 07/06/2022   MCV 88.1 07/06/2022   PLT 279 07/06/2022   Lab Results  Component Value Date   IRON 80 07/06/2022   TIBC 316 07/06/2022   IRONPCTSAT 25 07/06/2022   Lab Results  Component Value Date   FERRITIN 46 07/06/2022     STUDIES: CARDIAC CATHETERIZATION  Result Date: 07/08/2022   Prox RCA lesion is 30% stenosed.   1st Diag-1 lesion is 75% stenosed.   1st Diag-2 lesion is 80% stenosed.   Prox LAD lesion is 55% stenosed.   The left ventricular systolic function is normal.   The left ventricular ejection fraction is 55-65% by visual estimate.    ASSESSMENT: Iron deficiency anemia, secondary to chronic blood loss.  PLAN:    1. Iron deficiency anemia:  Secondary to GI loss most likely due to dark tarry stools.  Has had several capsule endoscopies and colonoscopies that showed nonbleeding lymphangiectasia.  Labs from 07/06/2022 show a hemoglobin of 12.8 and MCV 88.1.  Differential is normal.  Iron is 46 and iron saturation is 25%.  No additional IV iron needed.  2.  Melena/hematochezia/constipation/abdominal pain:  No current symptoms.  We will touch base with GI if this occurs.  Monitor.  3.  Hypertension:  Stable.  Followed by cardiology.  4.  Palpitations: Secondary to atrial fibrillation.  Followed by cardiology.  Currently on Imdur.  Had cardiac cath.  Disposition: Return to clinic in 4 to 6 months for follow-up with lab work, see MD and possible IV iron.  Please call sooner if you develop any symptoms that are concerning.  Patient asked for shot appointments due to frequent MD visits and states that she is aware of the symptoms when  her iron levels are low.  I spent 25 minutes dedicated to the care of this patient (face-to-face and non-face-to-face) on the date of the encounter to include what is described in the assessment and plan.  Patient expressed understanding and was in agreement with this plan. She also understands that She can call clinic at any time with any questions, concerns, or complaints.   Jacquelin Hawking, NP 07/13/22

## 2022-07-27 DIAGNOSIS — E1165 Type 2 diabetes mellitus with hyperglycemia: Secondary | ICD-10-CM | POA: Diagnosis not present

## 2022-08-13 DIAGNOSIS — E1159 Type 2 diabetes mellitus with other circulatory complications: Secondary | ICD-10-CM | POA: Diagnosis not present

## 2022-08-16 DIAGNOSIS — I10 Essential (primary) hypertension: Secondary | ICD-10-CM | POA: Diagnosis not present

## 2022-08-16 DIAGNOSIS — I340 Nonrheumatic mitral (valve) insufficiency: Secondary | ICD-10-CM | POA: Diagnosis not present

## 2022-08-16 DIAGNOSIS — E669 Obesity, unspecified: Secondary | ICD-10-CM | POA: Diagnosis not present

## 2022-08-16 DIAGNOSIS — E782 Mixed hyperlipidemia: Secondary | ICD-10-CM | POA: Diagnosis not present

## 2022-08-16 DIAGNOSIS — R609 Edema, unspecified: Secondary | ICD-10-CM | POA: Diagnosis not present

## 2022-08-16 DIAGNOSIS — R010 Benign and innocent cardiac murmurs: Secondary | ICD-10-CM | POA: Diagnosis not present

## 2022-08-16 DIAGNOSIS — E118 Type 2 diabetes mellitus with unspecified complications: Secondary | ICD-10-CM | POA: Diagnosis not present

## 2022-08-16 DIAGNOSIS — R002 Palpitations: Secondary | ICD-10-CM | POA: Diagnosis not present

## 2022-08-16 DIAGNOSIS — I2510 Atherosclerotic heart disease of native coronary artery without angina pectoris: Secondary | ICD-10-CM | POA: Diagnosis not present

## 2022-08-19 DIAGNOSIS — E669 Obesity, unspecified: Secondary | ICD-10-CM | POA: Diagnosis not present

## 2022-08-19 DIAGNOSIS — R809 Proteinuria, unspecified: Secondary | ICD-10-CM | POA: Diagnosis not present

## 2022-08-19 DIAGNOSIS — E1169 Type 2 diabetes mellitus with other specified complication: Secondary | ICD-10-CM | POA: Diagnosis not present

## 2022-08-19 DIAGNOSIS — M810 Age-related osteoporosis without current pathological fracture: Secondary | ICD-10-CM | POA: Diagnosis not present

## 2022-08-19 DIAGNOSIS — I10 Essential (primary) hypertension: Secondary | ICD-10-CM | POA: Diagnosis not present

## 2022-08-19 DIAGNOSIS — Z794 Long term (current) use of insulin: Secondary | ICD-10-CM | POA: Diagnosis not present

## 2022-08-19 DIAGNOSIS — E1129 Type 2 diabetes mellitus with other diabetic kidney complication: Secondary | ICD-10-CM | POA: Diagnosis not present

## 2022-08-19 DIAGNOSIS — E785 Hyperlipidemia, unspecified: Secondary | ICD-10-CM | POA: Diagnosis not present

## 2022-08-19 DIAGNOSIS — E1159 Type 2 diabetes mellitus with other circulatory complications: Secondary | ICD-10-CM | POA: Diagnosis not present

## 2022-08-19 DIAGNOSIS — E1142 Type 2 diabetes mellitus with diabetic polyneuropathy: Secondary | ICD-10-CM | POA: Diagnosis not present

## 2022-08-23 DIAGNOSIS — E118 Type 2 diabetes mellitus with unspecified complications: Secondary | ICD-10-CM | POA: Diagnosis not present

## 2022-08-23 DIAGNOSIS — I10 Essential (primary) hypertension: Secondary | ICD-10-CM | POA: Diagnosis not present

## 2022-08-23 DIAGNOSIS — G4733 Obstructive sleep apnea (adult) (pediatric): Secondary | ICD-10-CM | POA: Diagnosis not present

## 2022-08-23 DIAGNOSIS — I2510 Atherosclerotic heart disease of native coronary artery without angina pectoris: Secondary | ICD-10-CM | POA: Diagnosis not present

## 2022-08-23 DIAGNOSIS — E782 Mixed hyperlipidemia: Secondary | ICD-10-CM | POA: Diagnosis not present

## 2022-08-23 DIAGNOSIS — M8589 Other specified disorders of bone density and structure, multiple sites: Secondary | ICD-10-CM | POA: Diagnosis not present

## 2022-08-26 DIAGNOSIS — E1165 Type 2 diabetes mellitus with hyperglycemia: Secondary | ICD-10-CM | POA: Diagnosis not present

## 2022-08-26 DIAGNOSIS — I10 Essential (primary) hypertension: Secondary | ICD-10-CM | POA: Diagnosis not present

## 2022-08-26 DIAGNOSIS — E782 Mixed hyperlipidemia: Secondary | ICD-10-CM | POA: Diagnosis not present

## 2022-08-27 DIAGNOSIS — E1165 Type 2 diabetes mellitus with hyperglycemia: Secondary | ICD-10-CM | POA: Diagnosis not present

## 2022-08-31 DIAGNOSIS — I2510 Atherosclerotic heart disease of native coronary artery without angina pectoris: Secondary | ICD-10-CM | POA: Diagnosis not present

## 2022-08-31 DIAGNOSIS — E782 Mixed hyperlipidemia: Secondary | ICD-10-CM | POA: Diagnosis not present

## 2022-08-31 DIAGNOSIS — I201 Angina pectoris with documented spasm: Secondary | ICD-10-CM | POA: Diagnosis not present

## 2022-08-31 DIAGNOSIS — R002 Palpitations: Secondary | ICD-10-CM | POA: Diagnosis not present

## 2022-08-31 DIAGNOSIS — I10 Essential (primary) hypertension: Secondary | ICD-10-CM | POA: Diagnosis not present

## 2022-08-31 DIAGNOSIS — I340 Nonrheumatic mitral (valve) insufficiency: Secondary | ICD-10-CM | POA: Diagnosis not present

## 2022-08-31 DIAGNOSIS — R079 Chest pain, unspecified: Secondary | ICD-10-CM | POA: Diagnosis not present

## 2022-08-31 DIAGNOSIS — R0602 Shortness of breath: Secondary | ICD-10-CM | POA: Diagnosis not present

## 2022-09-15 DIAGNOSIS — E1136 Type 2 diabetes mellitus with diabetic cataract: Secondary | ICD-10-CM | POA: Diagnosis not present

## 2022-09-15 DIAGNOSIS — H259 Unspecified age-related cataract: Secondary | ICD-10-CM | POA: Diagnosis not present

## 2022-09-15 DIAGNOSIS — I25119 Atherosclerotic heart disease of native coronary artery with unspecified angina pectoris: Secondary | ICD-10-CM | POA: Diagnosis not present

## 2022-09-15 DIAGNOSIS — E785 Hyperlipidemia, unspecified: Secondary | ICD-10-CM | POA: Diagnosis not present

## 2022-09-15 DIAGNOSIS — J449 Chronic obstructive pulmonary disease, unspecified: Secondary | ICD-10-CM | POA: Diagnosis not present

## 2022-09-15 DIAGNOSIS — I252 Old myocardial infarction: Secondary | ICD-10-CM | POA: Diagnosis not present

## 2022-09-15 DIAGNOSIS — I110 Hypertensive heart disease with heart failure: Secondary | ICD-10-CM | POA: Diagnosis not present

## 2022-09-15 DIAGNOSIS — E669 Obesity, unspecified: Secondary | ICD-10-CM | POA: Diagnosis not present

## 2022-09-15 DIAGNOSIS — E1151 Type 2 diabetes mellitus with diabetic peripheral angiopathy without gangrene: Secondary | ICD-10-CM | POA: Diagnosis not present

## 2022-09-15 DIAGNOSIS — Z008 Encounter for other general examination: Secondary | ICD-10-CM | POA: Diagnosis not present

## 2022-09-15 DIAGNOSIS — I509 Heart failure, unspecified: Secondary | ICD-10-CM | POA: Diagnosis not present

## 2022-09-15 DIAGNOSIS — R69 Illness, unspecified: Secondary | ICD-10-CM | POA: Diagnosis not present

## 2022-09-15 DIAGNOSIS — Z794 Long term (current) use of insulin: Secondary | ICD-10-CM | POA: Diagnosis not present

## 2022-09-26 DIAGNOSIS — E1165 Type 2 diabetes mellitus with hyperglycemia: Secondary | ICD-10-CM | POA: Diagnosis not present

## 2022-09-30 DIAGNOSIS — R010 Benign and innocent cardiac murmurs: Secondary | ICD-10-CM | POA: Diagnosis not present

## 2022-09-30 DIAGNOSIS — E669 Obesity, unspecified: Secondary | ICD-10-CM | POA: Diagnosis not present

## 2022-09-30 DIAGNOSIS — R002 Palpitations: Secondary | ICD-10-CM | POA: Diagnosis not present

## 2022-09-30 DIAGNOSIS — R079 Chest pain, unspecified: Secondary | ICD-10-CM | POA: Diagnosis not present

## 2022-09-30 DIAGNOSIS — I10 Essential (primary) hypertension: Secondary | ICD-10-CM | POA: Diagnosis not present

## 2022-09-30 DIAGNOSIS — E118 Type 2 diabetes mellitus with unspecified complications: Secondary | ICD-10-CM | POA: Diagnosis not present

## 2022-09-30 DIAGNOSIS — R0602 Shortness of breath: Secondary | ICD-10-CM | POA: Diagnosis not present

## 2022-09-30 DIAGNOSIS — I340 Nonrheumatic mitral (valve) insufficiency: Secondary | ICD-10-CM | POA: Diagnosis not present

## 2022-09-30 DIAGNOSIS — E782 Mixed hyperlipidemia: Secondary | ICD-10-CM | POA: Diagnosis not present

## 2022-09-30 DIAGNOSIS — I2510 Atherosclerotic heart disease of native coronary artery without angina pectoris: Secondary | ICD-10-CM | POA: Diagnosis not present

## 2022-10-20 ENCOUNTER — Telehealth: Payer: Self-pay | Admitting: Pharmacy Technician

## 2022-10-20 DIAGNOSIS — Z596 Low income: Secondary | ICD-10-CM

## 2022-10-20 NOTE — Progress Notes (Signed)
[place] [facility])                                            [place] Team    10/20/2022  Joyce Potter [birth year] [id number]                                      Medication Assistance Referral-FOR 2024 RE ENROLLMENT  Referral From: [place]  Medication/Company: Humulin 70/30 Claiborne Rigg / [employer] Patient application portion:  Education officer, museum portion: Faxed  to Dr. Lucilla Lame Provider address/fax verified via: Office website  Medication/Company: Danelle Berry / [employer] Patient application portion:  Mailed Provider application portion: Faxed  to Dr. Lucilla Lame Provider address/fax verified via: Office website  Medication/Company: Stann [employer]re / [employer] Patient application portion:  Mailed Provider application portion: Faxed  to Dr. Lamonte Sakai Provider address/fax verified via: Office website  [name], [name]  [phone]

## 2022-10-21 DIAGNOSIS — E113393 Type 2 diabetes mellitus with moderate nonproliferative diabetic retinopathy without macular edema, bilateral: Secondary | ICD-10-CM | POA: Diagnosis not present

## 2022-10-25 ENCOUNTER — Encounter (INDEPENDENT_AMBULATORY_CARE_PROVIDER_SITE_OTHER): Payer: Self-pay

## 2022-10-27 DIAGNOSIS — E1165 Type 2 diabetes mellitus with hyperglycemia: Secondary | ICD-10-CM | POA: Diagnosis not present

## 2022-11-01 DIAGNOSIS — I2510 Atherosclerotic heart disease of native coronary artery without angina pectoris: Secondary | ICD-10-CM | POA: Diagnosis not present

## 2022-11-01 DIAGNOSIS — I10 Essential (primary) hypertension: Secondary | ICD-10-CM | POA: Diagnosis not present

## 2022-11-01 DIAGNOSIS — E669 Obesity, unspecified: Secondary | ICD-10-CM | POA: Diagnosis not present

## 2022-11-01 DIAGNOSIS — R002 Palpitations: Secondary | ICD-10-CM | POA: Diagnosis not present

## 2022-11-01 DIAGNOSIS — R010 Benign and innocent cardiac murmurs: Secondary | ICD-10-CM | POA: Diagnosis not present

## 2022-11-01 DIAGNOSIS — I340 Nonrheumatic mitral (valve) insufficiency: Secondary | ICD-10-CM | POA: Diagnosis not present

## 2022-11-01 DIAGNOSIS — E118 Type 2 diabetes mellitus with unspecified complications: Secondary | ICD-10-CM | POA: Diagnosis not present

## 2022-11-01 DIAGNOSIS — R079 Chest pain, unspecified: Secondary | ICD-10-CM | POA: Diagnosis not present

## 2022-11-01 DIAGNOSIS — E782 Mixed hyperlipidemia: Secondary | ICD-10-CM | POA: Diagnosis not present

## 2022-11-01 DIAGNOSIS — I351 Nonrheumatic aortic (valve) insufficiency: Secondary | ICD-10-CM | POA: Diagnosis not present

## 2022-11-08 DIAGNOSIS — E782 Mixed hyperlipidemia: Secondary | ICD-10-CM | POA: Diagnosis not present

## 2022-11-08 DIAGNOSIS — E669 Obesity, unspecified: Secondary | ICD-10-CM | POA: Diagnosis not present

## 2022-11-08 DIAGNOSIS — R002 Palpitations: Secondary | ICD-10-CM | POA: Diagnosis not present

## 2022-11-08 DIAGNOSIS — I10 Essential (primary) hypertension: Secondary | ICD-10-CM | POA: Diagnosis not present

## 2022-11-08 DIAGNOSIS — I351 Nonrheumatic aortic (valve) insufficiency: Secondary | ICD-10-CM | POA: Diagnosis not present

## 2022-11-08 DIAGNOSIS — I2510 Atherosclerotic heart disease of native coronary artery without angina pectoris: Secondary | ICD-10-CM | POA: Diagnosis not present

## 2022-11-08 DIAGNOSIS — I340 Nonrheumatic mitral (valve) insufficiency: Secondary | ICD-10-CM | POA: Diagnosis not present

## 2022-11-08 DIAGNOSIS — E118 Type 2 diabetes mellitus with unspecified complications: Secondary | ICD-10-CM | POA: Diagnosis not present

## 2022-11-10 ENCOUNTER — Telehealth: Payer: Self-pay | Admitting: Pharmacy Technician

## 2022-11-10 DIAGNOSIS — Z596 Low income: Secondary | ICD-10-CM

## 2022-11-10 NOTE — Progress Notes (Signed)
[facility])                                            [place] Team    11/10/2022  Joyce Potter 06-09-1946 [id number]  Received both patient and provider portion(s) of patient assistance application(s) for Trulicity, Humulin 44/97 and Spiriva. Faxed completed application and required documents into [employer] for Trulicity and Humulin 53/00 and to [employer] for Spiriva.   Pierrette Scheu P. Shanel Prazak, [place]  [phone]

## 2022-11-17 DIAGNOSIS — Z20822 Contact with and (suspected) exposure to covid-19: Secondary | ICD-10-CM | POA: Diagnosis not present

## 2022-11-25 DIAGNOSIS — I10 Essential (primary) hypertension: Secondary | ICD-10-CM | POA: Diagnosis not present

## 2022-11-25 DIAGNOSIS — E1165 Type 2 diabetes mellitus with hyperglycemia: Secondary | ICD-10-CM | POA: Diagnosis not present

## 2022-11-26 DIAGNOSIS — E1165 Type 2 diabetes mellitus with hyperglycemia: Secondary | ICD-10-CM | POA: Diagnosis not present

## 2022-12-01 ENCOUNTER — Telehealth: Payer: Self-pay | Admitting: *Deleted

## 2022-12-01 NOTE — Telephone Encounter (Signed)
Patient called asking if she can come in to get her labs checked today as she is feeling s/he has no energy . He next appointment is 1//18/24. Please advise

## 2022-12-02 ENCOUNTER — Inpatient Hospital Stay: Payer: Medicare HMO | Attending: Oncology

## 2022-12-02 DIAGNOSIS — D500 Iron deficiency anemia secondary to blood loss (chronic): Secondary | ICD-10-CM | POA: Insufficient documentation

## 2022-12-02 DIAGNOSIS — K921 Melena: Secondary | ICD-10-CM | POA: Insufficient documentation

## 2022-12-02 DIAGNOSIS — Z79899 Other long term (current) drug therapy: Secondary | ICD-10-CM | POA: Diagnosis not present

## 2022-12-02 LAB — CBC WITH DIFFERENTIAL/PLATELET
Abs Immature Granulocytes: 0.02 10*3/uL (ref 0.00–0.07)
Basophils Absolute: 0.1 10*3/uL (ref 0.0–0.1)
Basophils Relative: 2 %
Eosinophils Absolute: 0.4 10*3/uL (ref 0.0–0.5)
Eosinophils Relative: 9 %
HCT: 32.1 % — ABNORMAL LOW (ref 36.0–46.0)
Hemoglobin: 9.9 g/dL — ABNORMAL LOW (ref 12.0–15.0)
Immature Granulocytes: 0 %
Lymphocytes Relative: 24 %
Lymphs Abs: 1.2 10*3/uL (ref 0.7–4.0)
MCH: 25.9 pg — ABNORMAL LOW (ref 26.0–34.0)
MCHC: 30.8 g/dL (ref 30.0–36.0)
MCV: 84 fL (ref 80.0–100.0)
Monocytes Absolute: 0.6 10*3/uL (ref 0.1–1.0)
Monocytes Relative: 11 %
Neutro Abs: 2.6 10*3/uL (ref 1.7–7.7)
Neutrophils Relative %: 54 %
Platelets: 284 10*3/uL (ref 150–400)
RBC: 3.82 MIL/uL — ABNORMAL LOW (ref 3.87–5.11)
RDW: 13.2 % (ref 11.5–15.5)
WBC: 4.9 10*3/uL (ref 4.0–10.5)
nRBC: 0 % (ref 0.0–0.2)

## 2022-12-02 LAB — FERRITIN: Ferritin: 10 ng/mL — ABNORMAL LOW (ref 11–307)

## 2022-12-02 LAB — IRON AND TIBC
Iron: 42 ug/dL (ref 28–170)
Saturation Ratios: 12 % (ref 10.4–31.8)
TIBC: 347 ug/dL (ref 250–450)
UIBC: 305 ug/dL

## 2022-12-07 ENCOUNTER — Encounter: Payer: Self-pay | Admitting: Nurse Practitioner

## 2022-12-07 ENCOUNTER — Telehealth: Payer: Self-pay | Admitting: *Deleted

## 2022-12-07 ENCOUNTER — Inpatient Hospital Stay (HOSPITAL_BASED_OUTPATIENT_CLINIC_OR_DEPARTMENT_OTHER): Payer: Medicare HMO | Admitting: Nurse Practitioner

## 2022-12-07 ENCOUNTER — Inpatient Hospital Stay: Payer: Medicare HMO

## 2022-12-07 ENCOUNTER — Encounter: Payer: Self-pay | Admitting: Oncology

## 2022-12-07 VITALS — BP 138/53 | HR 85 | Temp 98.5°F | Resp 17

## 2022-12-07 VITALS — BP 154/82 | HR 90 | Temp 99.4°F | Wt 192.0 lb

## 2022-12-07 DIAGNOSIS — D500 Iron deficiency anemia secondary to blood loss (chronic): Secondary | ICD-10-CM

## 2022-12-07 DIAGNOSIS — K921 Melena: Secondary | ICD-10-CM | POA: Diagnosis not present

## 2022-12-07 DIAGNOSIS — Z79899 Other long term (current) drug therapy: Secondary | ICD-10-CM | POA: Diagnosis not present

## 2022-12-07 MED ORDER — SODIUM CHLORIDE 0.9 % IV SOLN
Freq: Once | INTRAVENOUS | Status: AC
  Filled 2022-12-07: qty 250

## 2022-12-07 MED ORDER — SODIUM CHLORIDE 0.9 % IV SOLN
200.0000 mg | Freq: Once | INTRAVENOUS | Status: AC
  Administered 2022-12-07: 200 mg via INTRAVENOUS
  Filled 2022-12-07: qty 10

## 2022-12-07 MED ORDER — SODIUM CHLORIDE 0.9 % IV SOLN
200.0000 mg | Freq: Once | INTRAVENOUS | Status: DC
  Filled 2022-12-07: qty 10

## 2022-12-07 NOTE — Telephone Encounter (Signed)
Patient sent mychart message regarding her lab values/results. She was concerned that her results were not discussed with her at this time.   Hgb 9.9;  ferritin 10; pt is symptomatic and feels weak.  Per Dr. Grayland Ormond. [place] for patient to see app and have apt scheduled for venofer.  Left vm for patient to return my phone call to discuss her concerns. I tentatively scheduled her to see Lauren at 3pm today and venofer at 10 am tomorrow. Waiting on call back to see if patient can do this apt today.

## 2022-12-07 NOTE — Telephone Encounter (Signed)
Per Hassan Rowan, patient called back and confirmed these apts times.

## 2022-12-07 NOTE — Progress Notes (Signed)
Joyce Potter  [phone] [phone]) [phone]  ID: Joyce Potter OB: [birth year]  MR#: [id number]  QIO#:[id number]  Patient Care Team: [name], MD as PCP - General (Internal Medicine) Lloyd Huger, MD as Consulting Physician (Hematology and Oncology) Teodoro Spray, MD as Consulting Physician (Cardiology) Solum, Betsey Holiday, MD as Physician Assistant (Endocrinology) Virgel Manifold, MD (Inactive) as Consulting Physician (Gastroenterology) Jacquelin Hawking, NP as Nurse Practitioner (Oncology) Birder Robson, MD as Referring Physician (Ophthalmology)  CHIEF COMPLAINT: Iron deficiency anemia, secondary to chronic blood loss.  INTERVAL HISTORY: Patient returns to clinic for repeat laboratory, follow up, and consideration of IV venofer. She is experiencing palpitations, lightheadness, and fatigue. Says she is short of breath with exertion. Feels generally poorly. Denies blood loss.   REVIEW OF SYSTEMS:   Review of Systems  Constitutional:  Negative for chills, fever, malaise/fatigue and weight loss.  HENT:  Negative for hearing loss, nosebleeds, sore throat and tinnitus.   Eyes:  Negative for blurred vision and double vision.  Respiratory:  Negative for cough, hemoptysis, shortness of breath and wheezing.   Cardiovascular:  Negative for chest pain, palpitations and leg swelling.  Gastrointestinal:  Negative for abdominal pain, blood in stool, constipation, diarrhea, melena, nausea and vomiting.  Genitourinary:  Negative for dysuria, flank pain, hematuria and urgency.  Musculoskeletal:  Negative for back pain, falls, joint pain and myalgias.  Skin:  Negative for itching and rash.  Neurological:  Negative for dizziness, tingling, sensory change, loss of consciousness, weakness and headaches.  Endo/Heme/Allergies:  Negative for environmental allergies. Does not bruise/bleed easily.  Psychiatric/Behavioral:  Negative for depression. The  patient is not nervous/anxious and does not have insomnia.   As per HPI. Otherwise, a complete review of systems is negative.  PAST MEDICAL HISTORY: Past Medical History:  Diagnosis Date   Anemia    iron deficiency   Aortic stenosis    Basal cell carcinoma    CAD (coronary artery disease)    s/p Left circumflex stent in 2012   Carotid stenosis    Cataract    High cholesterol    HTN (hypertension)    Hyperlipidemia    IDDM (insulin dependent diabetes mellitus)    Multiple thyroid nodules    Benign   Peptic ulcer disease    Retinal artery occlusion    on left    PAST SURGICAL HISTORY: Past Surgical History:  Procedure Laterality Date   ABDOMINAL HYSTERECTOMY     ARTERY BIOPSY Left 11/19/2015   Procedure: BIOPSY TEMPORAL ARTERY;  Surgeon: Algernon Huxley, MD;  Location: [facility] ORS;  Service: Vascular;  Laterality: Left;   BREAST BIOPSY Left 2002   core- neg   BREAST EXCISIONAL BIOPSY     ??? not visible scar on neither breast- pt believes it to be right breast    CARDIAC CATHETERIZATION N/A 08/08/2015   Procedure: Right and Left Heart Cath and Coronary Angiography;  Surgeon: Teodoro Spray, MD;  Location: [place] [facility];  Service: Cardiovascular;  Laterality: N/A;   CARDIAC CATHETERIZATION Bilateral 09/03/2016   Procedure: Right/Left Heart Cath and Coronary Angiography;  Surgeon: Teodoro Spray, MD;  Location: [place] [facility];  Service: Cardiovascular;  Laterality: Bilateral;   CATARACT EXTRACTION W/ INTRAOCULAR LENS  IMPLANT, BILATERAL     COLONOSCOPY     in 2013- internal hemorrhoids   COLONOSCOPY WITH PROPOFOL N/A 07/07/2018   Procedure: COLONOSCOPY WITH PROPOFOL;  Surgeon: Manya Silvas, MD;  Location: [facility] ENDOSCOPY;  Service: Endoscopy;  Laterality: N/A;   COLONOSCOPY WITH PROPOFOL N/A 05/22/2020   Procedure: COLONOSCOPY WITH PROPOFOL;  Surgeon: Jonathon Bellows, MD;  Location: [facility] ENDOSCOPY;  Service: Gastroenterology;  Laterality: N/A;   CORONARY ANGIOGRAPHY N/A  09/14/2017   Procedure: CORONARY ANGIOGRAPHY;  Surgeon: Teodoro Spray, MD;  Location: [place] [facility];  Service: Cardiovascular;  Laterality: N/A;   CORONARY ANGIOPLASTY     CORONARY STENT INTERVENTION N/A 05/15/2020   Procedure: coronary angiography;  Surgeon: Teodoro Spray, MD;  Location: [place] [facility];  Service: Cardiovascular;  Laterality: N/A;   CORONARY STENT INTERVENTION N/A 05/15/2020   Procedure: CORONARY STENT INTERVENTION;  Surgeon: Isaias Cowman, MD;  Location: [place] [facility] LAB;  Service: Cardiovascular;  Laterality: N/A;   CORONARY STENT PLACEMENT     ENDARTERECTOMY Left 10/01/2015   Procedure: ENDARTERECTOMY CAROTID;  Surgeon: Algernon Huxley, MD;  Location: [facility] ORS;  Service: Vascular;  Laterality: Left;   ENTEROSCOPY N/A 07/24/2020   Procedure: ENTEROSCOPY;  Surgeon: Virgel Manifold, MD;  Location: [facility] ENDOSCOPY;  Service: Endoscopy;  Laterality: N/A;   ESOPHAGOGASTRODUODENOSCOPY     ESOPHAGOGASTRODUODENOSCOPY (EGD) WITH PROPOFOL N/A 05/31/2016   Procedure: ESOPHAGOGASTRODUODENOSCOPY (EGD) WITH PROPOFOL;  Surgeon: Manya Silvas, MD;  Location: [facility] ENDOSCOPY;  Service: Endoscopy;  Laterality: N/A;   ESOPHAGOGASTRODUODENOSCOPY (EGD) WITH PROPOFOL N/A 07/07/2018   Procedure: ESOPHAGOGASTRODUODENOSCOPY (EGD) WITH PROPOFOL;  Surgeon: Manya Silvas, MD;  Location: [facility];  Service: Endoscopy;  Laterality: N/A;   ESOPHAGOGASTRODUODENOSCOPY (EGD) WITH PROPOFOL N/A 07/17/2018   Procedure: ESOPHAGOGASTRODUODENOSCOPY (EGD) WITH PROPOFOL;  Surgeon: Lucilla Lame, MD;  Location: [facility] ENDOSCOPY;  Service: Endoscopy;  Laterality: N/A;   ESOPHAGOGASTRODUODENOSCOPY (EGD) WITH PROPOFOL N/A 05/21/2020   Procedure: ESOPHAGOGASTRODUODENOSCOPY (EGD) WITH PROPOFOL;  Surgeon: Jonathon Bellows, MD;  Location: [facility] ENDOSCOPY;  Service: Gastroenterology;  Laterality: N/A;   ESOPHAGOGASTRODUODENOSCOPY (EGD) WITH PROPOFOL N/A 06/13/2020   Procedure: ESOPHAGOGASTRODUODENOSCOPY  (EGD) WITH PROPOFOL;  Surgeon: Lucilla Lame, MD;  Location: [facility] ENDOSCOPY;  Service: Endoscopy;  Laterality: N/A;   EYE SURGERY     GIVENS CAPSULE STUDY N/A 04/17/2013   Procedure: GIVENS CAPSULE STUDY;  Surgeon: Arta Silence, MD;  Location: [facility] ENDOSCOPY;  Service: Endoscopy;  Laterality: N/A;  patient ate breakfast at [place] 06/11/2020   Procedure: GIVENS CAPSULE STUDY;  Surgeon: Virgel Manifold, MD;  Location: [facility] ENDOSCOPY;  Service: Endoscopy;  Laterality: N/A;   GIVENS CAPSULE STUDY N/A 01/29/2021   Procedure: GIVENS CAPSULE STUDY;  Surgeon: Virgel Manifold, MD;  Location: [facility] ENDOSCOPY;  Service: Endoscopy;  Laterality: N/A;   GIVENS CAPSULE STUDY N/A 08/24/2021   Procedure: GIVENS CAPSULE STUDY;  Surgeon: Virgel Manifold, MD;  Location: [facility] ENDOSCOPY;  Service: Endoscopy;  Laterality: N/A;   HEMORRHOID BANDING  07/27/2016   Dr Nicholes Stairs   LEFT HEART CATH AND CORONARY ANGIOGRAPHY N/A 07/08/2022   Procedure: LEFT HEART CATH AND CORONARY ANGIOGRAPHY;  Surgeon: Dionisio David, MD;  Location: [place] [facility] LAB;  Service: Cardiovascular;  Laterality: N/A;   PARTIAL HYSTERECTOMY     RIGHT AND LEFT HEART CATH Bilateral 09/14/2017   Procedure: RIGHT AND LEFT HEART CATH;  Surgeon: Teodoro Spray, MD;  Location: [place] [facility];  Service: Cardiovascular;  Laterality: Bilateral;   RIGHT/LEFT HEART CATH AND CORONARY ANGIOGRAPHY Right 05/15/2020   Procedure: Right/Left Heart cath;  Surgeon: Teodoro Spray, MD;  Location: [place] [facility];  Service: Cardiovascular;  Laterality: Right;   TRIGGER FINGER  RELEASE      FAMILY HISTORY Family History  Problem Relation Age of Onset   Uterine cancer Mother    Breast cancer Mother 18   Seizures Father    Stroke Father    Diabetes Father    COPD Father    Colon cancer Neg Hx       ADVANCED DIRECTIVES:    HEALTH MAINTENANCE: Social History   Tobacco Use   Smoking status: Former    Types:  Cigarettes    Quit date: 02/15/1985    Years since quitting: 37.8   Smokeless tobacco: Never   Tobacco comments:    quit about 31 years ago  Vaping Use   Vaping Use: Never used  Substance Use Topics   Alcohol use: No    Alcohol/week: 0.0 standard drinks of alcohol   Drug use: No    Colonoscopy:  PAP:  Bone density: Osteoporosis- DEXA 06/2017 (on fosamax)  Lipid panel:  Allergies  Allergen Reactions   Invokamet [Canagliflozin-Metformin Hcl] Other (See Comments)    Yeast Infection   Lovastatin Rash   Penicillins Rash    Has patient had a PCN reaction causing immediate rash, facial/tongue/throat swelling, SOB or lightheadedness with hypotension:Yes Has patient had a PCN reaction causing severe rash involving mucus membranes or skin necrosis:all over body Has patient had a PCN reaction that required hospitalization: No Has patient had a PCN reaction occurring within the last 10 years: No If all of the above answers are "NO", then may proceed with Cephalosporin use.    Vicodin [Hydrocodone-Acetaminophen] Rash    Current Outpatient Medications  Medication Sig Dispense Refill   albuterol (VENTOLIN HFA) 108 (90 Base) MCG/ACT inhaler Inhale 2 puffs into the lungs every 4 (four) hours as needed for wheezing or shortness of breath.      alendronate (FOSAMAX) 70 MG tablet Take 70 mg by mouth once a week.      amitriptyline (ELAVIL) 25 MG tablet Take 25 mg by mouth at bedtime as needed for sleep.      aspirin EC 81 MG EC tablet Take 1 tablet (81 mg total) by mouth daily. Swallow whole. 30 tablet 11   atorvastatin (LIPITOR) 20 MG tablet Take 20 mg by mouth daily.     baclofen (LIORESAL) 10 MG tablet Take 10 mg by mouth 4 (four) times daily as needed.     clopidogrel (PLAVIX) 75 MG tablet Take 1 tablet (75 mg total) by mouth daily with breakfast. 30 tablet 11   Dulaglutide 3 MG/0.5ML SOPN Inject 3 mLs into the skin once a week. Every saturday     DULoxetine (CYMBALTA) 20 MG capsule Take 20  mg by mouth 2 (two) times daily.     insulin NPH-regular Human (NOVOLIN 70/30) (70-30) 100 UNIT/ML injection Inject 30 Units into the skin with breakfast, with lunch, and with evening meal.     losartan (COZAAR) 25 MG tablet Take 25 mg by mouth daily.     meloxicam (MOBIC) 15 MG tablet Take 15 mg by mouth daily.     metoCLOPramide (REGLAN) 5 MG tablet Take 5 mg by mouth daily.     metoprolol tartrate (LOPRESSOR) 100 MG tablet Take 100 mg by mouth 2 (two) times daily.     Multiple Vitamin (MULTI-VITAMIN) tablet Take 1 tablet by mouth daily.     ONETOUCH VERIO test strip      pantoprazole (PROTONIX) 40 MG tablet Take 40 mg by mouth daily.     SPIRIVA HANDIHALER 18 MCG  inhalation capsule Place 18 mcg into inhaler and inhale daily in the afternoon.     traMADol (ULTRAM) 50 MG tablet Take 50 mg by mouth every 6 (six) hours as needed for moderate pain.     Vitamin A 2400 MCG (8000 UT) CAPS Take by mouth.     azithromycin (ZITHROMAX) 250 MG tablet Take by mouth. (Patient not taking: Reported on 07/08/2022)     BD INSULIN SYRINGE U/F 31G X 5/16" 1 ML MISC USE TWICE DAILY WITH INSULIN (Patient not taking: Reported on 07/13/2022)     CELEBREX 100 MG capsule Take 100 mg by mouth 2 (two) times daily as needed.     gabapentin (NEURONTIN) 300 MG capsule Take 300 mg by mouth 2 (two) times daily.  (Patient not taking: Reported on 07/08/2022)     indomethacin (INDOCIN) 50 MG capsule Take 1 capsule (50 mg total) by mouth 2 (two) times daily with a meal. (Patient not taking: Reported on 07/08/2022) 60 capsule 1   saccharomyces boulardii (FLORASTOR) 250 MG capsule Take 250 mg by mouth 2 (two) times daily.      No current facility-administered medications for this visit.   Facility-Administered Medications Ordered in Other Visits  Medication Dose Route Frequency Provider Last Rate Last Admin   heparin lock flush 100 unit/mL  500 Units Intravenous Once Verlon Au, NP       sodium chloride flush (NS) 0.9 %  injection 10 mL  10 mL Intravenous Once Verlon Au, NP       sodium chloride flush (NS) 0.9 % injection 3 mL  3 mL Intravenous Q12H Scoggins, Amber, NP        OBJECTIVE: Vitals:   12/07/22 1508  BP: (!) 154/82  Pulse: 90  Temp: 99.4 F (37.4 C)     Body mass index is 34.01 kg/m.    ECOG FS:0 - Asymptomatic  General: Well-developed, well-nourished, no acute distress. Eyes: Pink conjunctiva, anicteric sclera. Lungs: Clear to auscultation bilaterally.  No audible wheezing or coughing Heart: Regular rate and rhythm.  Abdomen: Soft, nontender, nondistended.  Musculoskeletal: No edema, cyanosis, or clubbing. Neuro: Alert, answering all questions appropriately. Cranial nerves grossly intact. Skin: No rashes or petechiae noted. Psych: Normal affect.   LAB RESULTS:  Lab Results  Component Value Date   NA 141 07/24/2020   K 4.1 07/24/2020   CL 104 07/24/2020   CO2 26 07/24/2020   GLUCOSE 102 (H) 07/24/2020   BUN 8 07/24/2020   CREATININE 0.79 07/24/2020   CALCIUM 8.5 (L) 07/24/2020   PROT 7.0 07/21/2020   ALBUMIN 3.8 07/21/2020   AST 18 07/21/2020   ALT 14 07/21/2020   ALKPHOS 72 07/21/2020   BILITOT 0.6 07/21/2020   GFRNONAA >60 07/24/2020   GFRAA >60 07/24/2020    Lab Results  Component Value Date   WBC 4.9 12/02/2022   NEUTROABS 2.6 12/02/2022   HGB 9.9 (L) 12/02/2022   HCT 32.1 (L) 12/02/2022   MCV 84.0 12/02/2022   PLT 284 12/02/2022   Lab Results  Component Value Date   IRON 42 12/02/2022   TIBC 347 12/02/2022   IRONPCTSAT 12 12/02/2022   Lab Results  Component Value Date   FERRITIN 10 (L) 12/02/2022     STUDIES: No results found.  ASSESSMENT: Iron deficiency anemia, secondary to chronic blood loss.  PLAN:    1. Iron deficiency anemia: secondary to GI losses most likely. She is s/p capsule endoscopy in February 2022 that only revealed 1 nonbleeding  lymphangiectasia. Prior to that patient's most recent luminal evaluation was in July 2021.  Bone marrow biopsy on February 15, 2017 did not reveal any significant pathology.  Hemoglobin has dropped. Now 9.9. Ferritin also decreased. She is symptomatic. Recommend venofer x 5.  2.  Melena/hematochezia/constipation/abdominal pain:  If ongoing drops in iron, consider follow up with GI.    3.  Hypertension: Chronic and unchanged.  Patient's blood pressure is much improved today. Continue evaluation and treatment per primary care.  4. Hyperglycemia- reviewed that venofer infusion can cause elevated blood sugars in some people. Monitor   Disposition: Venofer x 5 3 mo- lab (cbc, ferritin, iron studies) Day to week later- see [facility], +/- venofer- la (Appt can be virtual if patient prefers)  I spent a total of 30 minutes reviewing chart data, face-to-face evaluation with the patient, counseling and coordination of care as detailed above.  Patient expressed understanding and was in agreement with this plan. She also understands that She can call clinic at any time with any questions, concerns, or complaints.   Verlon Au, NP 12/07/22

## 2022-12-08 ENCOUNTER — Ambulatory Visit: Payer: Medicare HMO

## 2022-12-08 ENCOUNTER — Inpatient Hospital Stay: Payer: Medicare HMO

## 2022-12-08 DIAGNOSIS — E113393 Type 2 diabetes mellitus with moderate nonproliferative diabetic retinopathy without macular edema, bilateral: Secondary | ICD-10-CM | POA: Diagnosis not present

## 2022-12-13 ENCOUNTER — Inpatient Hospital Stay: Payer: Medicare HMO

## 2022-12-13 VITALS — BP 141/66 | HR 76 | Resp 16

## 2022-12-13 DIAGNOSIS — D500 Iron deficiency anemia secondary to blood loss (chronic): Secondary | ICD-10-CM

## 2022-12-13 DIAGNOSIS — K921 Melena: Secondary | ICD-10-CM | POA: Diagnosis not present

## 2022-12-13 DIAGNOSIS — Z79899 Other long term (current) drug therapy: Secondary | ICD-10-CM | POA: Diagnosis not present

## 2022-12-13 MED ORDER — SODIUM CHLORIDE 0.9 % IV SOLN
INTRAVENOUS | Status: DC
  Filled 2022-12-13: qty 250

## 2022-12-13 MED ORDER — SODIUM CHLORIDE 0.9 % IV SOLN
200.0000 mg | Freq: Once | INTRAVENOUS | Status: AC
  Administered 2022-12-13: 200 mg via INTRAVENOUS
  Filled 2022-12-13: qty 200

## 2022-12-13 NOTE — Patient Instructions (Signed)

## 2022-12-15 ENCOUNTER — Inpatient Hospital Stay: Payer: Medicare HMO

## 2022-12-15 VITALS — BP 128/62 | HR 68 | Temp 98.2°F | Resp 18

## 2022-12-15 DIAGNOSIS — D500 Iron deficiency anemia secondary to blood loss (chronic): Secondary | ICD-10-CM

## 2022-12-15 DIAGNOSIS — K921 Melena: Secondary | ICD-10-CM | POA: Diagnosis not present

## 2022-12-15 DIAGNOSIS — Z79899 Other long term (current) drug therapy: Secondary | ICD-10-CM | POA: Diagnosis not present

## 2022-12-15 MED ORDER — SODIUM CHLORIDE 0.9 % IV SOLN
200.0000 mg | Freq: Once | INTRAVENOUS | Status: AC
  Administered 2022-12-15: 200 mg via INTRAVENOUS
  Filled 2022-12-15: qty 200

## 2022-12-15 MED ORDER — SODIUM CHLORIDE 0.9 % IV SOLN
INTRAVENOUS | Status: DC
  Filled 2022-12-15: qty 250

## 2022-12-15 NOTE — Patient Instructions (Signed)

## 2022-12-16 DIAGNOSIS — E1169 Type 2 diabetes mellitus with other specified complication: Secondary | ICD-10-CM | POA: Diagnosis not present

## 2022-12-16 DIAGNOSIS — E1129 Type 2 diabetes mellitus with other diabetic kidney complication: Secondary | ICD-10-CM | POA: Diagnosis not present

## 2022-12-16 DIAGNOSIS — E1142 Type 2 diabetes mellitus with diabetic polyneuropathy: Secondary | ICD-10-CM | POA: Diagnosis not present

## 2022-12-16 DIAGNOSIS — E113393 Type 2 diabetes mellitus with moderate nonproliferative diabetic retinopathy without macular edema, bilateral: Secondary | ICD-10-CM | POA: Diagnosis not present

## 2022-12-16 DIAGNOSIS — E669 Obesity, unspecified: Secondary | ICD-10-CM | POA: Diagnosis not present

## 2022-12-16 DIAGNOSIS — H02132 Senile ectropion of right lower eyelid: Secondary | ICD-10-CM | POA: Diagnosis not present

## 2022-12-16 DIAGNOSIS — E1159 Type 2 diabetes mellitus with other circulatory complications: Secondary | ICD-10-CM | POA: Diagnosis not present

## 2022-12-16 DIAGNOSIS — R809 Proteinuria, unspecified: Secondary | ICD-10-CM | POA: Diagnosis not present

## 2022-12-16 DIAGNOSIS — E785 Hyperlipidemia, unspecified: Secondary | ICD-10-CM | POA: Diagnosis not present

## 2022-12-16 DIAGNOSIS — M810 Age-related osteoporosis without current pathological fracture: Secondary | ICD-10-CM | POA: Diagnosis not present

## 2022-12-16 DIAGNOSIS — Z794 Long term (current) use of insulin: Secondary | ICD-10-CM | POA: Diagnosis not present

## 2022-12-22 ENCOUNTER — Inpatient Hospital Stay: Payer: Medicare HMO

## 2022-12-22 VITALS — BP 122/60 | HR 72 | Temp 98.6°F | Resp 18

## 2022-12-22 DIAGNOSIS — D500 Iron deficiency anemia secondary to blood loss (chronic): Secondary | ICD-10-CM | POA: Diagnosis not present

## 2022-12-22 DIAGNOSIS — K921 Melena: Secondary | ICD-10-CM | POA: Diagnosis not present

## 2022-12-22 DIAGNOSIS — Z79899 Other long term (current) drug therapy: Secondary | ICD-10-CM | POA: Diagnosis not present

## 2022-12-22 MED ORDER — SODIUM CHLORIDE 0.9 % IV SOLN
INTRAVENOUS | Status: DC
  Filled 2022-12-22: qty 250

## 2022-12-22 MED ORDER — SODIUM CHLORIDE 0.9% FLUSH
10.0000 mL | Freq: Once | INTRAVENOUS | Status: DC
  Filled 2022-12-22: qty 10

## 2022-12-22 MED ORDER — SODIUM CHLORIDE 0.9 % IV SOLN
200.0000 mg | Freq: Once | INTRAVENOUS | Status: AC
  Administered 2022-12-22: 200 mg via INTRAVENOUS
  Filled 2022-12-22: qty 290.91

## 2022-12-22 NOTE — Patient Instructions (Signed)
[facility] AT [place]  Discharge Instructions: Thank you for choosing [facility] to provide your oncology and hematology care.  If you have a lab appointment with the [facility], please go directly to the [facility] and check in at the registration area.  Wear comfortable clothing and clothing appropriate for easy access to any Portacath or PICC line.   We strive to give you quality time with your provider. You may need to reschedule your appointment if you arrive late (15 or more minutes).  Arriving late affects you and other patients whose appointments are after yours.  Also, if you miss three or more appointments without notifying the office, you may be dismissed from the clinic at the provider's discretion.      For prescription refill requests, have your pharmacy contact our office and allow 72 hours for refills to be completed.    Today you received the following chemotherapy and/or immunotherapy agents VENOFER      To help prevent nausea and vomiting after your treatment, we encourage you to take your nausea medication as directed.  BELOW ARE SYMPTOMS THAT SHOULD BE REPORTED IMMEDIATELY: *FEVER GREATER THAN 100.4 F (38 C) OR HIGHER *CHILLS OR SWEATING *NAUSEA AND VOMITING THAT IS NOT CONTROLLED WITH YOUR NAUSEA MEDICATION *UNUSUAL SHORTNESS OF BREATH *UNUSUAL BRUISING OR BLEEDING *URINARY PROBLEMS (pain or burning when urinating, or frequent urination) *BOWEL PROBLEMS (unusual diarrhea, constipation, pain near the anus) TENDERNESS IN MOUTH AND THROAT WITH OR WITHOUT PRESENCE OF ULCERS (sore throat, sores in mouth, or a toothache) UNUSUAL RASH, SWELLING OR PAIN  UNUSUAL VAGINAL DISCHARGE OR ITCHING   Items with * indicate a potential emergency and should be followed up as soon as possible or go to the Emergency Department if any problems should occur.  Please show the CHEMOTHERAPY ALERT CARD or IMMUNOTHERAPY ALERT CARD at check-in to the  Emergency Department and triage nurse.  Should you have questions after your visit or need to cancel or reschedule your appointment, please contact [facility] [place] AT [place]  [phone] and follow the prompts.  Office hours are 8:00 a.m. to 4:30 p.m. Monday - Friday. Please note that voicemails left after 4:00 p.m. may not be returned until the following business day.  We are closed weekends and major holidays. You have access to a nurse at all times for urgent questions. Please call the main number to the clinic [phone] and follow the prompts.  For any non-urgent questions, you may also contact your provider using MyChart. We now offer e-Visits for anyone 38 and older to request care online for non-urgent symptoms. For details visit [email or web address].   Also download the MyChart app! Go to the app store, search "MyChart", open the app, select [facility], and log in with your MyChart username and password.  Iron Sucrose Injection What is this medication? IRON SUCROSE (EYE ern SOO krose) treats low levels of iron (iron deficiency anemia) in people with kidney disease. Iron is a mineral that plays an important role in making red blood cells, which carry oxygen from your lungs to the rest of your body. This medicine may be used for other purposes; ask your health care provider or pharmacist if you have questions. COMMON BRAND NAME(S): Venofer What should I tell my care team before I take this medication? They need to know if you have any of these conditions: Anemia not caused by low iron levels Heart disease High levels of iron in the blood Kidney disease  Liver disease An unusual or allergic reaction to iron, other medications, foods, dyes, or preservatives Pregnant or trying to get pregnant Breastfeeding How should I use this medication? This medication is for infusion into a vein. It is given in a hospital or clinic setting. Talk to your care team about the  use of this medication in children. While this medication may be prescribed for children as young as 2 years for selected conditions, precautions do apply. Overdosage: If you think you have taken too much of this medicine contact a poison control center or emergency room at once. NOTE: This medicine is only for you. Do not share this medicine with others. What if I miss a dose? Keep appointments for follow-up doses. It is important not to miss your dose. Call your care team if you are unable to keep an appointment. What may interact with this medication? Do not take this medication with any of the following: Deferoxamine Dimercaprol Other iron products This medication may also interact with the following: Chloramphenicol Deferasirox This list may not describe all possible interactions. Give your health care provider a list of all the medicines, herbs, non-prescription drugs, or dietary supplements you use. Also tell them if you smoke, drink alcohol, or use illegal drugs. Some items may interact with your medicine. What should I watch for while using this medication? Visit your care team regularly. Tell your care team if your symptoms do not start to get better or if they get worse. You may need blood work done while you are taking this medication. You may need to follow a special diet. Talk to your care team. Foods that contain iron include: whole grains/cereals, dried fruits, beans, or peas, leafy green vegetables, and organ meats (liver, kidney). What side effects may I notice from receiving this medication? Side effects that you should report to your care team as soon as possible: Allergic reactions--skin rash, itching, hives, swelling of the face, lips, tongue, or throat Low blood pressure--dizziness, feeling faint or lightheaded, blurry vision Shortness of breath Side effects that usually do not require medical attention (report to your care team if they continue or are  bothersome): Flushing Headache Joint pain Muscle pain Nausea Pain, redness, or irritation at injection site This list may not describe all possible side effects. Call your doctor for medical advice about side effects. You may report side effects to FDA at 1-800-FDA-1088. Where should I keep my medication? This medication is given in a hospital or clinic and will not be stored at home. NOTE: This sheet is a summary. It may not cover all possible information. If you have questions about this medicine, talk to your doctor, pharmacist, or health care provider.  2023 Elsevier/Gold Standard (2021-03-26 00:00:00)

## 2022-12-24 ENCOUNTER — Inpatient Hospital Stay: Payer: Medicare HMO

## 2022-12-24 VITALS — BP 129/59 | HR 75 | Temp 97.9°F | Resp 18

## 2022-12-24 DIAGNOSIS — Z79899 Other long term (current) drug therapy: Secondary | ICD-10-CM | POA: Diagnosis not present

## 2022-12-24 DIAGNOSIS — D500 Iron deficiency anemia secondary to blood loss (chronic): Secondary | ICD-10-CM

## 2022-12-24 DIAGNOSIS — K921 Melena: Secondary | ICD-10-CM | POA: Diagnosis not present

## 2022-12-24 MED ORDER — SODIUM CHLORIDE 0.9 % IV SOLN
Freq: Once | INTRAVENOUS | Status: AC
  Filled 2022-12-24: qty 250

## 2022-12-24 MED ORDER — SODIUM CHLORIDE 0.9 % IV SOLN
200.0000 mg | Freq: Once | INTRAVENOUS | Status: AC
  Administered 2022-12-24: 200 mg via INTRAVENOUS
  Filled 2022-12-24: qty 200

## 2022-12-28 ENCOUNTER — Inpatient Hospital Stay: Payer: Medicare HMO | Attending: Oncology

## 2022-12-28 VITALS — BP 117/53 | HR 70 | Resp 16

## 2022-12-28 DIAGNOSIS — K921 Melena: Secondary | ICD-10-CM | POA: Insufficient documentation

## 2022-12-28 DIAGNOSIS — D500 Iron deficiency anemia secondary to blood loss (chronic): Secondary | ICD-10-CM | POA: Insufficient documentation

## 2022-12-28 MED ORDER — SODIUM CHLORIDE 0.9 % IV SOLN
200.0000 mg | Freq: Once | INTRAVENOUS | Status: AC
  Administered 2022-12-28: 200 mg via INTRAVENOUS
  Filled 2022-12-28: qty 10

## 2022-12-28 MED ORDER — SODIUM CHLORIDE 0.9 % IV SOLN
Freq: Once | INTRAVENOUS | Status: AC
  Filled 2022-12-28: qty 250

## 2022-12-31 ENCOUNTER — Encounter: Payer: Self-pay | Admitting: Oncology

## 2023-01-03 NOTE — Progress Notes (Unsigned)
Cardiology Office Note  Date:  01/04/2023   ID:  Joyce Potter, DOB [birth year], MRN [id number]  PCP:  [name], MD   Chief Complaint  Patient presents with   New Patient (Initial Visit)    Ref by Dr. Ubaldo Glassing to establish care for heart palpitations & CAD. Medications reviewed by the patient verbally.     HPI:  Joyce Potter  is a 77 year old woman with past medical history of aortic valve stenosis Anemia on iron infusions, GI losses Former smoker Hypertension Who presents for new patient evaluation for her aortic valve stenosis, chest pain, coronary disease  In follow-up today Long discussion concerning underlying coronary disease noted on cardiac catheterization on July 2023, significant disease noted but medical management recommended Since then she is continue to have anginal symptoms, worse when anemia "Gives out with walking, cannot breathe, chest gets tight"  Cramping in her legs at night, on a statin  Review of echocardiograms over the past several years shows worsening aortic valve gradient   Cardiac catheterization July 2023, images pulled up and reviewed with her today   Prox RCA lesion is 30% stenosed.   1st Diag-1 lesion is 75% stenosed.   1st Diag-2 lesion is 80% stenosed.   Prox LAD lesion is 55% stenosed.   The left ventricular systolic function is normal.   The left ventricular ejection fraction is 55-65% by visual estimate.  Echocardiogram March 2023 NORMAL LEFT VENTRICULAR SYSTOLIC FUNCTION   WITH MILD LVH  NORMAL RIGHT VENTRICULAR SYSTOLIC FUNCTION  MILD VALVULAR REGURGITATION (See above)  MODERATE VALVULAR STENOSIS (See above)  ESTIMATED LVEF >55%  Aortic: MILD AI; MODERATE AS  AOV: SEVERELY THICKENED, PARTIALLY MOBILE LEAFLETS; AS MAX VELOCITY 4.42ms; AVA 1.0cm^2  Mitral: TRIVIAL MR  Tricuspid: TRACE TR  Pulmonic: MILD PI  MILDLY DILATED ASCENDING AORTA MEASURING 3.3cm  Closest EF: >55% (Estimated)  LVH: MILD LVH  AVS: MODERATE AS   Aortic: MILD AR: 398.0 cm/sec peak vel       63.4 mmHg peak grad  37.2 mmHg mean grad     EKG personally reviewed by myself on todays visit Normal sinus rhythm rate 69 bpm nonspecific ST abnormality, unable to exclude old anterior MI  PMH:   has a past medical history of Anemia, Aortic stenosis, Basal cell carcinoma, CAD (coronary artery disease), Carotid stenosis, Cataract, High cholesterol, HTN (hypertension), Hyperlipidemia, IDDM (insulin dependent diabetes mellitus), Multiple thyroid nodules, Peptic ulcer disease, and Retinal artery occlusion.  PSH:    Past Surgical History:  Procedure Laterality Date   ABDOMINAL HYSTERECTOMY     ARTERY BIOPSY Left 11/19/2015   Procedure: BIOPSY TEMPORAL ARTERY;  Surgeon: JAlgernon Huxley MD;  Location: [facility];  Service: Vascular;  Laterality: Left;   BREAST BIOPSY Left 2002   core- neg   BREAST EXCISIONAL BIOPSY     ??? not visible scar on neither breast- pt believes it to be right breast    CARDIAC CATHETERIZATION N/A 08/08/2015   Procedure: Right and Left Heart Cath and Coronary Angiography;  Surgeon: KTeodoro Spray MD;  [facility];  Service: Cardiovascular;  Laterality: N/A;   CARDIAC CATHETERIZATION Bilateral 09/03/2016   Procedure: Right/Left Heart Cath and Coronary Angiography;  Surgeon: KTeodoro Spray MD;  [facility];  Service: Cardiovascular;  Laterality: Bilateral;   CATARACT EXTRACTION W/ INTRAOCULAR LENS  IMPLANT, BILATERAL     COLONOSCOPY     in 2013- internal hemorrhoids   COLONOSCOPY WITH PROPOFOL N/A  07/07/2018   Procedure: COLONOSCOPY WITH PROPOFOL;  Surgeon: Manya Silvas, MD;  Location: [facility] END[facility]COPY;  Service: Endoscopy;  Laterality: N/A;   COLONOSCOPY WITH PROPOFOL N/A 05/22/2020   Procedure: COLONOSCOPY WITH PROPOFOL;  Surgeon: Jonathon Bellows, MD;  Location: [facility]COPY;  Service: Gastroenterology;  Laterality: N/A;   CORONARY ANGIOGRAPHY N/A 09/14/2017   Procedure: CORONARY  ANGIOGRAPHY;  Surgeon: Teodoro Spray, MD;  Location: [place] [facility] LAB;  Service: Cardiovascular;  Laterality: N/A;   CORONARY ANGIOPLASTY     CORONARY STENT INTERVENTION N/A 05/15/2020   Procedure: coronary angiography;  Surgeon: Teodoro Spray, MD;  Location: [place] CV LAB;  Service: Cardiovascular;  Laterality: N/A;   CORONARY STENT INTERVENTION N/A 05/15/2020   Procedure: CORONARY STENT INTERVENTION;  Surgeon: Isaias Cowman, MD;  Location: [place] CV LAB;  Service: Cardiovascular;  Laterality: N/A;   CORONARY STENT PLACEMENT     ENDARTERECTOMY Left 10/01/2015   Procedure: ENDARTERECTOMY CAROTID;  Surgeon: Algernon Huxley, MD;  Location: [facility];  Service: Vascular;  Laterality: Left;   ENTEROSCOPY N/A 07/24/2020   Procedure: ENTEROSCOPY;  Surgeon: Virgel Manifold, MD;  Location: [facility] ENDOSCOPY;  Service: Endoscopy;  Laterality: N/A;   ESOPHAGOGASTRODUODENOSCOPY     ESOPHAGOGASTRODUODENOSCOPY (EGD) WITH PROPOFOL N/A 05/31/2016   Procedure: ESOPHAGOGASTRODUODENOSCOPY (EGD) WITH PROPOFOL;  Surgeon: Manya Silvas, MD;  Location: [facility] ENDOSCOPY;  Service: Endoscopy;  Laterality: N/A;   ESOPHAGOGASTRODUODENOSCOPY (EGD) WITH PROPOFOL N/A 07/07/2018   Procedure: ESOPHAGOGASTRODUODENOSCOPY (EGD) WITH PROPOFOL;  Surgeon: Manya Silvas, MD;  Location: [facility] ENDOSCOPY;  Service: Endoscopy;  Laterality: N/A;   ESOPHAGOGASTRODUODENOSCOPY (EGD) WITH PROPOFOL N/A 07/17/2018   Procedure: ESOPHAGOGASTRODUODENOSCOPY (EGD) WITH PROPOFOL;  Surgeon: Lucilla Lame, MD;  Location: [facility] ENDOSCOPY;  Service: Endoscopy;  Laterality: N/A;   ESOPHAGOGASTRODUODENOSCOPY (EGD) WITH PROPOFOL N/A 05/21/2020   Procedure: ESOPHAGOGASTRODUODENOSCOPY (EGD) WITH PROPOFOL;  Surgeon: Jonathon Bellows, MD;  Location: [facility] ENDOSCOPY;  Service: Gastroenterology;  Laterality: N/A;   ESOPHAGOGASTRODUODENOSCOPY (EGD) WITH PROPOFOL N/A 06/13/2020   Procedure: ESOPHAGOGASTRODUODENOSCOPY (EGD) WITH PROPOFOL;  Surgeon:  Lucilla Lame, MD;  Location: [facility] ENDOSCOPY;  Service: Endoscopy;  Laterality: N/A;   EYE SURGERY     GIVENS CAPSULE STUDY N/A 04/17/2013   Procedure: GIVENS CAPSULE STUDY;  Surgeon: Arta Silence, MD;  Location: [facility] ENDOSCOPY;  Service: Endoscopy;  Laterality: N/A;  patient ate breakfast at [place] 06/11/2020   Procedure: GIVENS CAPSULE STUDY;  Surgeon: Virgel Manifold, MD;  Location: [facility] ENDOSCOPY;  Service: Endoscopy;  Laterality: N/A;   GIVENS CAPSULE STUDY N/A 01/29/2021   Procedure: GIVENS CAPSULE STUDY;  Surgeon: Virgel Manifold, MD;  Location: [facility] ENDOSCOPY;  Service: Endoscopy;  Laterality: N/A;   GIVENS CAPSULE STUDY N/A 08/24/2021   Procedure: GIVENS CAPSULE STUDY;  Surgeon: Virgel Manifold, MD;  Location: [facility] ENDOSCOPY;  Service: Endoscopy;  Laterality: N/A;   HEMORRHOID BANDING  07/27/2016   Dr Nicholes Stairs   LEFT HEART CATH AND CORONARY ANGIOGRAPHY N/A 07/08/2022   Procedure: LEFT HEART CATH AND CORONARY ANGIOGRAPHY;  Surgeon: Dionisio David, MD;  Location: [place] CV LAB;  Service: Cardiovascular;  Laterality: N/A;   PARTIAL HYSTERECTOMY     RIGHT AND LEFT HEART CATH Bilateral 09/14/2017   Procedure: RIGHT AND LEFT HEART CATH;  Surgeon: Teodoro Spray, MD;  Location: [place] CV LAB;  Service: Cardiovascular;  Laterality: Bilateral;   RIGHT/LEFT HEART CATH AND CORONARY ANGIOGRAPHY Right 05/15/2020   Procedure: Right/Left Heart cath;  Surgeon: Bartholome Bill  A, MD;  Location: [place] CV LAB;  Service: Cardiovascular;  Laterality: Right;   TRIGGER FINGER RELEASE      Current Outpatient Medications  Medication Sig Dispense Refill   albuterol (VENTOLIN HFA) 108 (90 Base) MCG/ACT inhaler Inhale 2 puffs into the lungs every 4 (four) hours as needed for wheezing or shortness of breath.      amitriptyline (ELAVIL) 25 MG tablet Take 25 mg by mouth at bedtime as needed for sleep.      aspirin EC 81 MG EC tablet Take 1 tablet (81 mg total)  by mouth daily. Swallow whole. 30 tablet 11   clopidogrel (PLAVIX) 75 MG tablet Take 1 tablet (75 mg total) by mouth daily with breakfast. 30 tablet 11   Dulaglutide 3 MG/0.5ML SOPN Inject 3 mLs into the skin once a week. Every saturday     indomethacin (INDOCIN) 50 MG capsule Take 1 capsule (50 mg total) by mouth 2 (two) times daily with a meal. 60 capsule 1   insulin NPH-regular Human (NOVOLIN 70/30) (70-30) 100 UNIT/ML injection Inject 30 Units into the skin with breakfast, with lunch, and with evening meal.     isosorbide mononitrate (IMDUR) 60 MG 24 hr tablet Take 60 mg by mouth 2 (two) times daily.     losartan (COZAAR) 25 MG tablet Take 25 mg by mouth daily.     metoprolol succinate (TOPROL-XL) 100 MG 24 hr tablet Take 200 mg every am & 100 mg every pm     Multiple Vitamin (MULTI-VITAMIN) tablet Take 1 tablet by mouth daily.     pantoprazole (PROTONIX) 40 MG tablet Take 40 mg by mouth daily.     pravastatin (PRAVACHOL) 20 MG tablet Take 20 mg by mouth daily.     traMADol (ULTRAM) 50 MG tablet Take 50 mg by mouth every 6 (six) hours as needed for moderate pain.     Vitamin A 2400 MCG (8000 UT) CAPS Take by mouth.     alendronate (FOSAMAX) 70 MG tablet Take 70 mg by mouth once a week.  (Patient not taking: Reported on 01/04/2023)     azithromycin (ZITHROMAX) 250 MG tablet Take by mouth. (Patient not taking: Reported on 07/08/2022)     baclofen (LIORESAL) 10 MG tablet Take 10 mg by mouth 4 (four) times daily as needed. (Patient not taking: Reported on 01/04/2023)     BD INSULIN SYRINGE U/F 31G X 5/16" 1 ML MISC USE TWICE DAILY WITH INSULIN (Patient not taking: Reported on 07/13/2022)     CELEBREX 100 MG capsule Take 100 mg by mouth 2 (two) times daily as needed. (Patient not taking: Reported on 01/04/2023)     DULoxetine (CYMBALTA) 20 MG capsule Take 20 mg by mouth 2 (two) times daily. (Patient not taking: Reported on 01/04/2023)     gabapentin (NEURONTIN) 300 MG capsule Take 300 mg by mouth 2 (two)  times daily.  (Patient not taking: Reported on 07/08/2022)     meloxicam (MOBIC) 15 MG tablet Take 15 mg by mouth daily. (Patient not taking: Reported on 01/04/2023)     metoCLOPramide (REGLAN) 5 MG tablet Take 5 mg by mouth daily. (Patient not taking: Reported on 01/04/2023)     ONETOUCH VERIO test strip  (Patient not taking: Reported on 01/04/2023)     saccharomyces boulardii (FLORASTOR) 250 MG capsule Take 250 mg by mouth 2 (two) times daily.  (Patient not taking: Reported on 01/04/2023)     SPIRIVA HANDIHALER 18 MCG inhalation capsule Place 18 mcg  into inhaler and inhale daily in the afternoon. (Patient not taking: Reported on 01/04/2023)     No current facility-administered medications for this visit.   Facility-Administered Medications Ordered in Other Visits  Medication Dose Route Frequency Provider Last Rate Last Admin   heparin lock flush 100 unit/mL  500 Units Intravenous Once Verlon Au, NP       sodium chloride flush (NS) 0.9 % injection 10 mL  10 mL Intravenous Once Verlon Au, NP       sodium chloride flush (NS) 0.9 % injection 3 mL  3 mL Intravenous Q12H Scoggins, Amber, NP         Allergies:   Invokamet [canagliflozin-metformin hcl], Lovastatin, Penicillins, and Vicodin [hydrocodone-acetaminophen]   Social History:  The patient  reports that she quit smoking about 37 years ago. Her smoking use included cigarettes. She has never used smokeless tobacco. She reports that she does not drink alcohol and does not use drugs.   Family History:   family history includes Breast cancer (age of onset: 63) in her mother; COPD in her father; Diabetes in her father; Seizures in her father; Stroke in her father; Uterine cancer in her mother.    Review of Systems: Review of Systems  Constitutional: Negative.   HENT: Negative.    Respiratory:  Positive for shortness of breath.   Cardiovascular:  Positive for chest pain.  Gastrointestinal: Negative.   Musculoskeletal: Negative.    Neurological: Negative.   Psychiatric/Behavioral: Negative.    All other systems reviewed and are negative.    PHYSICAL EXAM: VS:  BP 132/76 (BP Location: Right Arm, Patient Position: Sitting, Cuff Size: Normal)   Pulse 69   Ht '5\' 5"'$  (1.651 m)   Wt 193 lb (87.5 kg)   SpO2 98%   BMI 32.12 kg/m  , BMI Body mass index is 32.12 kg/m. GEN: Well nourished, well developed, in no acute distress HEENT: normal Neck: no JVD, carotid bruits, or masses Cardiac: RRR; 3/6 systolic ejection murmur right sternal border No rubs or gallops Respiratory:  clear to auscultation bilaterally, normal work of breathing GI: soft, nontender, nondistended, + BS MS: no deformity or atrophy Skin: warm and dry, no rash Neuro:  Strength and sensation are intact Psych: euthymic mood, full affect   Recent Labs: 12/02/2022: Hemoglobin 9.9; Platelets 284    Lipid Panel Lab Results  Component Value Date   CHOL 130 05/21/2020   HDL 35 (L) 05/21/2020   LDLCALC 79 05/21/2020   TRIG 81 05/21/2020      Wt Readings from Last 3 Encounters:  01/04/23 193 lb (87.5 kg)  12/07/22 192 lb (87.1 kg)  07/13/22 193 lb (87.5 kg)       ASSESSMENT AND PLAN:  Problem List Items Addressed This Visit       Cardiology Problems   CAD (coronary artery disease) - Primary (Chronic)   Relevant Medications   isosorbide mononitrate (IMDUR) 60 MG 24 hr tablet   metoprolol succinate (TOPROL-XL) 100 MG 24 hr tablet   pravastatin (PRAVACHOL) 20 MG tablet   PVD (peripheral vascular disease) (HCC)   Relevant Medications   isosorbide mononitrate (IMDUR) 60 MG 24 hr tablet   metoprolol succinate (TOPROL-XL) 100 MG 24 hr tablet   pravastatin (PRAVACHOL) 20 MG tablet   Aortic stenosis   Relevant Medications   isosorbide mononitrate (IMDUR) 60 MG 24 hr tablet   metoprolol succinate (TOPROL-XL) 100 MG 24 hr tablet   pravastatin (PRAVACHOL) 20 MG tablet   Aortic  atherosclerosis (HCC)   Relevant Medications   isosorbide  mononitrate (IMDUR) 60 MG 24 hr tablet   metoprolol succinate (TOPROL-XL) 100 MG 24 hr tablet   pravastatin (PRAVACHOL) 20 MG tablet   HLD (hyperlipidemia)   Relevant Medications   isosorbide mononitrate (IMDUR) 60 MG 24 hr tablet   metoprolol succinate (TOPROL-XL) 100 MG 24 hr tablet   pravastatin (PRAVACHOL) 20 MG tablet     Other   Uncontrolled type 2 diabetes mellitus with hyperglycemia (HCC)   Relevant Medications   pravastatin (PRAVACHOL) 20 MG tablet   GI bleed from an occult source with recurrent chronic blood loss anemia   Iron deficiency anemia due to chronic blood loss   Aortic valve stenosis Severe mean gradient, peak gradient, velocity noted March 2023 at [facility] Worsening anginal symptoms on discussion today Repeat echocardiogram has been ordered, referral placed to structural heart clinic for consideration of TAVR at her request She is indicated she would prefer TAVR over CABG/AVR if possible Discussed with her that structural heart clinic would be the first place to start, though coronary intervention may be indicated  Coronary disease with stable angina Severe coronary disease on review of catheterization images July 2023 Will refer to structural heart clinic to determine if intervention is indicated, PCI versus CABG. underlying coronary disease with aortic valve stenosis likely contributing to exertional angina  Hyperlipidemia Reports that she is on a statin, having cramping in her legs Reports that pravastatin was changed to Lipitor 20 by endocrinology Recommend she take a statin holiday for several weeks to see if cramping resolves May need PCSK9 inhibitor if symptoms improve  Diabetes type 2 with complications Y6A 6.5, followed by endocrinology  Anemia Reports having iron deficiency anemia, received numerous rounds of iron Hemoglobin 9.9 in December 2023 Iron level within normal range    Total encounter time more than 60 minutes  Greater than 50% was  spent in counseling and coordination of care with the patient    Signed, Esmond Plants, M.D., Ph.D. [place], [place]

## 2023-01-04 ENCOUNTER — Ambulatory Visit: Payer: Medicare HMO | Attending: Cardiovascular Disease | Admitting: Cardiovascular Disease

## 2023-01-04 ENCOUNTER — Encounter: Payer: Self-pay | Admitting: Cardiovascular Disease

## 2023-01-04 VITALS — BP 132/76 | HR 69 | Ht 65.0 in | Wt 193.0 lb

## 2023-01-04 DIAGNOSIS — I700 Atherosclerosis of aorta: Secondary | ICD-10-CM | POA: Diagnosis not present

## 2023-01-04 DIAGNOSIS — I25118 Atherosclerotic heart disease of native coronary artery with other forms of angina pectoris: Secondary | ICD-10-CM | POA: Diagnosis not present

## 2023-01-04 DIAGNOSIS — I350 Nonrheumatic aortic (valve) stenosis: Secondary | ICD-10-CM

## 2023-01-04 DIAGNOSIS — D500 Iron deficiency anemia secondary to blood loss (chronic): Secondary | ICD-10-CM

## 2023-01-04 DIAGNOSIS — E1165 Type 2 diabetes mellitus with hyperglycemia: Secondary | ICD-10-CM

## 2023-01-04 DIAGNOSIS — E782 Mixed hyperlipidemia: Secondary | ICD-10-CM

## 2023-01-04 DIAGNOSIS — I739 Peripheral vascular disease, unspecified: Secondary | ICD-10-CM

## 2023-01-04 DIAGNOSIS — K921 Melena: Secondary | ICD-10-CM

## 2023-01-04 MED ORDER — SERTRALINE HCL 25 MG PO TABS: 25.0000 mg | ORAL_TABLET | Freq: Every day | ORAL | 0 refills | Status: DC

## 2023-01-04 NOTE — Patient Instructions (Addendum)
Echo for aortic valve stenosis  Referral to valvular heart clinic in [place], aortic valve stenosis and CAD  Hold plavix 5 days before surgery on eyes  Medication Instructions:  No changes  If you need a refill on your cardiac medications before your next appointment, please call your pharmacy.   Lab work: No new labs needed  Testing/Procedures: Your physician has requested that you have an echocardiogram. Echocardiography is a painless test that uses sound waves to create images of your heart. It provides your doctor with information about the size and shape of your heart and how well your heart's chambers and valves are working. This procedure takes approximately one hour. There are no restrictions for this procedure. Please do NOT wear cologne, perfume, aftershave, or lotions (deodorant is allowed). Please arrive 15 minutes prior to your appointment time.  Follow-Up: At [facility], you and your health needs are our priority.  As part of our continuing mission to provide you with exceptional heart care, we have created designated Provider Care Teams.  These Care Teams include your primary Cardiologist (physician) and Advanced Practice Providers (APPs -  Physician Assistants and Nurse Practitioners) who all work together to provide you with the care you need, when you need it.  You will need a follow up appointment in 6 months  Providers on your designated Care Team:   Murray Hodgkins, NP Christell Faith, PA-C Cadence Kathlen Mody, [place]  COVID-19 Vaccine Information can be found at: [email or web address] For questions related to vaccine distribution or appointments, please email [email or web address] or call [phone].

## 2023-01-10 ENCOUNTER — Telehealth: Payer: Self-pay | Admitting: Pharmacy Technician

## 2023-01-10 DIAGNOSIS — Z596 Low income: Secondary | ICD-10-CM

## 2023-01-10 NOTE — Progress Notes (Signed)
[facility])                                            [place] Team    01/10/2023  EMINE LOPATA 1946-02-21 [id number]  Care coordination calls placed to [place] in regard to Trulicity and Humulin 67/28 and to [facility] in regard to Spiriva.  Spoke to [place] at [place] and she informs patient is APPROVED 09/02/90-50/41/36 for Trulicity and Humulin 43/83. She informs patient is on auto fill and medications will auto refill and ship to her home based on last fill dates in 2023.  Spoke to Essie at [facility] who informs she is unable to locate the renewal application that was faxed in November and requests it be refaxed. Refaxed to [facility] today.  Horace Lukas P. Shalynn Jorstad, [place]  [phone]

## 2023-01-13 ENCOUNTER — Ambulatory Visit: Payer: Medicare HMO

## 2023-01-13 ENCOUNTER — Ambulatory Visit: Payer: Medicare HMO | Admitting: Oncology

## 2023-01-13 ENCOUNTER — Other Ambulatory Visit: Payer: Medicare HMO

## 2023-01-15 ENCOUNTER — Encounter: Payer: Self-pay | Admitting: Cardiovascular Disease

## 2023-01-17 ENCOUNTER — Encounter: Payer: Self-pay | Admitting: Cardiovascular Disease

## 2023-01-17 ENCOUNTER — Ambulatory Visit: Payer: Medicare HMO

## 2023-01-20 ENCOUNTER — Ambulatory Visit (HOSPITAL_COMMUNITY): Payer: Medicare HMO | Attending: Internal Medicine

## 2023-01-20 DIAGNOSIS — I350 Nonrheumatic aortic (valve) stenosis: Secondary | ICD-10-CM | POA: Diagnosis not present

## 2023-01-20 LAB — ECHOCARDIOGRAM COMPLETE
AR max vel: 0.86 cm2
AV Area VTI: 0.79 cm2
AV Area mean vel: 0.76 cm2
AV Mean grad: 40.8 mmHg
AV Peak grad: 62.2 mmHg
Ao pk vel: 3.94 m/s
Area-P 1/2: 3.45 cm2
P 1/2 time: 283 msec
S' Lateral: 2.5 cm

## 2023-01-21 ENCOUNTER — Encounter: Payer: Self-pay | Admitting: Internal Medicine

## 2023-01-21 ENCOUNTER — Ambulatory Visit: Payer: Medicare HMO | Attending: Internal Medicine | Admitting: Internal Medicine

## 2023-01-21 VITALS — BP 152/78 | HR 73 | Ht 63.0 in | Wt 191.2 lb

## 2023-01-21 DIAGNOSIS — E1169 Type 2 diabetes mellitus with other specified complication: Secondary | ICD-10-CM

## 2023-01-21 DIAGNOSIS — I25118 Atherosclerotic heart disease of native coronary artery with other forms of angina pectoris: Secondary | ICD-10-CM | POA: Diagnosis not present

## 2023-01-21 DIAGNOSIS — E785 Hyperlipidemia, unspecified: Secondary | ICD-10-CM

## 2023-01-21 DIAGNOSIS — I350 Nonrheumatic aortic (valve) stenosis: Secondary | ICD-10-CM | POA: Diagnosis not present

## 2023-01-21 DIAGNOSIS — I152 Hypertension secondary to endocrine disorders: Secondary | ICD-10-CM

## 2023-01-21 DIAGNOSIS — E1159 Type 2 diabetes mellitus with other circulatory complications: Secondary | ICD-10-CM | POA: Diagnosis not present

## 2023-01-21 DIAGNOSIS — Z794 Long term (current) use of insulin: Secondary | ICD-10-CM

## 2023-01-21 DIAGNOSIS — Z01812 Encounter for preprocedural laboratory examination: Secondary | ICD-10-CM

## 2023-01-21 DIAGNOSIS — E118 Type 2 diabetes mellitus with unspecified complications: Secondary | ICD-10-CM

## 2023-01-21 NOTE — Patient Instructions (Signed)
Medication Instructions:  No changes *If you need a refill on your cardiac medications before your next appointment, please call your pharmacy*   Lab Work: Today: cbc, bmet   Testing/Procedures: Your physician has requested that you have a cardiac catheterization. Cardiac catheterization is used to diagnose and/or treat various heart conditions. Doctors may recommend this procedure for a number of different reasons. The most common reason is to evaluate chest pain. Chest pain can be a symptom of coronary artery disease (CAD), and cardiac catheterization can show whether plaque is narrowing or blocking your heart's arteries. This procedure is also used to evaluate the valves, as well as measure the blood flow and oxygen levels in different parts of your heart. For further information please visit HugeFiesta.[email or web address]. Please follow instruction sheet, as given.   Follow-Up: Per Structural Heart Team  Cath Instructions: [place] A DEPT OF [place], [place] [id number] [place] [address] Dept: [phone] Loc: [place]  01/21/2023  You are scheduled for a Cardiac Catheterization on Wednesday, January 31 with Dr. Lenna Sciara.  1. Please arrive at the [facility] (Main Entrance A) at [facility]: [address] [place], [place] [address] at 9:30 AM (This time is two hours before your procedure to ensure your preparation). Free valet parking service is available.   Special note: Every effort is made to have your procedure done on time. Please understand that emergencies sometimes delay scheduled procedures.  2. Diet: Do not eat solid foods after midnight.  The patient may have clear liquids until 5am upon the day of the procedure.  3. Labs: You will need to have blood drawn today. You do not need to be fasting.  4. Medication instructions in preparation  for your procedure:   Contrast Allergy: No  On the morning of your procedure, take your Plavix/Clopidogrel and any morning medicines NOT listed above.  You may use sips of water.  5. Plan for one night stay--bring personal belongings. 6. Bring a current list of your medications and current insurance cards. 7. You MUST have a responsible person to drive you home. 8. Someone MUST be with you the first 24 hours after you arrive home or your discharge will be delayed. 9. Please wear clothes that are easy to get on and off and wear slip-on shoes.  Thank you for allowing [place] to care for you!   -- [place] [facility]

## 2023-01-21 NOTE — H&P (View-Only) (Signed)
Patient ID: Joyce Potter MRN: [id number] DOB/AGE: 76/04/1946 76 y.o.  Primary Care Physician:Khan, Nyra Jabs, MD Primary Cardiologist: Ida Rogue, MD   FOCUSED CARDIOVASCULAR PROBLEM LIST:   1.  Severe aortic stenosis with an aortic valve area of 0.79 cm grade, mean gradient of 40.8 mmHg, and a peak velocity of 3.9 m/s with an ejection fraction of 60-65%; EKG shows sinus rhythm without bundle-branch blocks 2.  Longstanding iron deficiency anemia on chronic iron infusions 3.  Hypertension 4.  Coronary artery disease status post PCI of the mid right coronary in 2021; coronary angiography July 2023 demonstrated patent mid right coronary artery stent and high-grade disease of a large first diagonal 5.  Insulin-dependent diabetes mellitus 6.  Hyperlipidemia 7.  Stage II chronic kidney disease 8.  TIA s/p LCEA 2016    HISTORY OF PRESENT ILLNESS: The patient is a 77 y.o. female with the indicated medical history here for recommendations regarding her severe aortic stenosis.  She was seen by Dr. Rockey Situ recently.  With complaints of anginal symptoms as well as shortness of breath.  Her anginal symptoms seem to be worse when she is quite anemic.  She had undergone coronary angiography in July of last year which demonstrated diffuse moderate disease and high-grade disease of the long diagonal and medical therapy was pursued and DAPT started.  Her last hemoglobin in December was 9.9.  The patient tells me that she is not short of breath with minimal exertion.  She is not short of breath at rest.  She occasionally gets chest pain when she exerts herself as well.  She gets lightheaded when she goes from sitting to standing as well but has had no frank syncope.  She has not noticed any dark or bloody stools.  She is being followed by hematology and was told to call hematology when she felt unwell.  She has not had a repeat CBC done in more than a month.  She fortunately has not required any  emergency room visits or hospitalizations.  Her husband is currently in rehabilitation and she fears that she will not be able to take care of him at home due to her ongoing symptoms.  She does not smoke.  She has both upper and lower dentures.  Past Medical History:  Diagnosis Date   Anemia    iron deficiency   Aortic stenosis    Basal cell carcinoma    CAD (coronary artery disease)    s/p Left circumflex stent in 2012   Carotid stenosis    Cataract    High cholesterol    HTN (hypertension)    Hyperlipidemia    IDDM (insulin dependent diabetes mellitus)    Multiple thyroid nodules    Benign   Peptic ulcer disease    Retinal artery occlusion    on left    Past Surgical History:  Procedure Laterality Date   ABDOMINAL HYSTERECTOMY     ARTERY BIOPSY Left 11/19/2015   Procedure: BIOPSY TEMPORAL ARTERY;  Surgeon: Algernon Huxley, MD;  Location: [facility];  Service: Vascular;  Laterality: Left;   BREAST BIOPSY Left 2002   core- neg   BREAST EXCISIONAL BIOPSY     ??? not visible scar on neither breast- pt believes it to be right breast    CARDIAC CATHETERIZATION N/A 08/08/2015   Procedure: Right and Left Heart Cath and Coronary Angiography;  Surgeon: Teodoro Spray, MD;  Location: [place] [facility];  Service: Cardiovascular;  Laterality: N/A;  CARDIAC CATHETERIZATION Bilateral 09/03/2016   Procedure: Right/Left Heart Cath and Coronary Angiography;  Surgeon: Teodoro Spray, MD;  Location: [place] [facility];  Service: Cardiovascular;  Laterality: Bilateral;   CATARACT EXTRACTION W/ INTRAOCULAR LENS  IMPLANT, BILATERAL     COLONOSCOPY     in 2013- internal hemorrhoids   COLONOSCOPY WITH PROPOFOL N/A 07/07/2018   Procedure: COLONOSCOPY WITH PROPOFOL;  Surgeon: Manya Silvas, MD;  Location[facility];  Service: Endoscopy;  Laterality: N/A;   COLONOSCOPY WITH PROPOFOL N/A 05/22/2020   Procedure: COLONOSCOPY WITH PROPOFOL;  Surgeon: Jonathon Bellows, MD;  Location: [facility] ENDOSCOPY;   Service: Gastroenterology;  Laterality: N/A;   CORONARY ANGIOGRAPHY N/A 09/14/2017   Procedure: CORONARY ANGIOGRAPHY;  Surgeon: Teodoro Spray, MD;  Location: [place] [facility];  Service: Cardiovascular;  Laterality: N/A;   CORONARY ANGIOPLASTY     CORONARY STENT INTERVENTION N/A 05/15/2020   Procedure: coronary angiography;  Surgeon: Teodoro Spray, MD;  Location: [place] [facility];  Service: Cardiovascular;  Laterality: N/A;   CORONARY STENT INTERVENTION N/A 05/15/2020   Procedure: CORONARY STENT INTERVENTION;  Surgeon: Isaias Cowman, MD;  Location: [place] [facility];  Service: Cardiovascular;  Laterality: N/A;   CORONARY STENT PLACEMENT     ENDARTERECTOMY Left 10/01/2015   Procedure: ENDARTERECTOMY CAROTID;  Surgeon: Algernon Huxley, MD;  Location: [facility];  Service: Vascular;  Laterality: Left;   ENTEROSCOPY N/A 07/24/2020   Procedure: ENTEROSCOPY;  Surgeon: Virgel Manifold, MD;  Location: [facility] ENDOSCOPY;  Service: Endoscopy;  Laterality: N/A;   ESOPHAGOGASTRODUODENOSCOPY     ESOPHAGOGASTRODUODENOSCOPY (EGD) WITH PROPOFOL N/A 05/31/2016   Procedure: ESOPHAGOGASTRODUODENOSCOPY (EGD) WITH PROPOFOL;  Surgeon: Manya Silvas, MD;  Location[facility];  Service: Endoscopy;  Laterality: N/A;   ESOPHAGOGASTRODUODENOSCOPY (EGD) WITH PROPOFOL N/A 07/07/2018   Procedure: ESOPHAGOGASTRODUODENOSCOPY (EGD) WITH PROPOFOL;  Surgeon: Manya Silvas, MD;  Location: [facility];  Service: Endoscopy;  Laterality: N/A;   ESOPHAGOGASTRODUODENOSCOPY (EGD) WITH PROPOFOL N/A 07/17/2018   Procedure: ESOPHAGOGASTRODUODENOSCOPY (EGD) WITH PROPOFOL;  Surgeon: Lucilla Lame, MD;  Location: [facility] ENDOSCOPY;  Service: Endoscopy;  Laterality: N/A;   ESOPHAGOGASTRODUODENOSCOPY (EGD) WITH PROPOFOL N/A 05/21/2020   Procedure: ESOPHAGOGASTRODUODENOSCOPY (EGD) WITH PROPOFOL;  Surgeon: Jonathon Bellows, MD;  Location: [facility] ENDOSCOPY;  Service: Gastroenterology;  Laterality: N/A;   ESOPHAGOGASTRODUODENOSCOPY  (EGD) WITH PROPOFOL N/A 06/13/2020   Procedure: ESOPHAGOGASTRODUODENOSCOPY (EGD) WITH PROPOFOL;  Surgeon: Lucilla Lame, MD;  Location: [facility] ENDOSCOPY;  Service: Endoscopy;  Laterality: N/A;   EYE SURGERY     GIVENS CAPSULE STUDY N/A 04/17/2013   Procedure: GIVENS CAPSULE STUDY;  Surgeon: Arta Silence, MD;  Location: [facility] ENDOSCOPY;  Service: Endoscopy;  Laterality: N/A;  patient ate breakfast at [place] 06/11/2020   Procedure: GIVENS CAPSULE STUDY;  Surgeon: Virgel Manifold, MD;  Location: [facility] ENDOSCOPY;  Service: Endoscopy;  Laterality: N/A;   GIVENS CAPSULE STUDY N/A 01/29/2021   Procedure: GIVENS CAPSULE STUDY;  Surgeon: Virgel Manifold, MD;  Location: [facility] ENDOSCOPY;  Service: Endoscopy;  Laterality: N/A;   GIVENS CAPSULE STUDY N/A 08/24/2021   Procedure: GIVENS CAPSULE STUDY;  Surgeon: Virgel Manifold, MD;  Location: [facility] ENDOSCOPY;  Service: Endoscopy;  Laterality: N/A;   HEMORRHOID BANDING  07/27/2016   Dr Nicholes Stairs   LEFT HEART CATH AND CORONARY ANGIOGRAPHY N/A 07/08/2022   Procedure: LEFT HEART CATH AND CORONARY ANGIOGRAPHY;  Surgeon: Dionisio David, MD;  Location: [place] [facility];  Service: Cardiovascular;  Laterality: N/A;  PARTIAL HYSTERECTOMY     RIGHT AND LEFT HEART CATH Bilateral 09/14/2017   Procedure: RIGHT AND LEFT HEART CATH;  Surgeon: Teodoro Spray, MD;  Location: [place] [facility];  Service: Cardiovascular;  Laterality: Bilateral;   RIGHT/LEFT HEART CATH AND CORONARY ANGIOGRAPHY Right 05/15/2020   Procedure: Right/Left Heart cath;  Surgeon: Teodoro Spray, MD;  Location: [place] [facility];  Service: Cardiovascular;  Laterality: Right;   TRIGGER FINGER RELEASE      Family History  Problem Relation Age of Onset   Uterine cancer Mother    Breast cancer Mother 110   Seizures Father    Stroke Father    Diabetes Father    COPD Father    Colon cancer Neg Hx     Social History   Socioeconomic History   Marital status:  Married    Spouse name: [name]   Number of children: 5   Years of education: Not on file   Highest education level: Not on file  Occupational History   Occupation: retired   Tobacco Use   Smoking status: Former    Types: Cigarettes    Quit date: 02/15/1985    Years since quitting: 37.9   Smokeless tobacco: Never   Tobacco comments:    quit about 31 years ago  Vaping Use   Vaping Use: Never used  Substance and Sexual Activity   Alcohol use: No    Alcohol/week: 0.0 standard drinks of alcohol   Drug use: No   Sexual activity: Not on file  Other Topics Concern   Not on file  Social History Narrative   Lives at home independently.   Organ donor   Had 5 children- son timothy   Son died in 43   Social Determinants of Health   Financial Resource Strain: Low Risk  (05/31/2022)   Overall Financial Resource Strain (CARDIA)    Difficulty of Paying Living Expenses: Not hard at all  Food Insecurity: No Food Insecurity (02/18/2022)   Hunger Vital Sign    Worried About Running Out of Food in the Last Year: Never true    Ran Out of Food in the Last Year: Never true  Transportation Needs: No Transportation Needs (05/31/2022)   PRAPARE - Hydrologist (Medical): No    Lack of Transportation (Non-Medical): No  Physical Activity: Not on file  Stress: No Stress Concern Present (02/18/2022)   [place]    Feeling of Stress : Only a little  Social Connections: Socially Integrated (12/16/2021)   Social Connection and Isolation Panel [NHANES]    Frequency of Communication with Friends and Family: More than three times a week    Frequency of Social Gatherings with Friends and Family: More than three times a week    Attends Religious Services: More than 4 times per year    Active Member of [employer] or Organizations: Yes    Attends Music therapist: More than 4 times per year    Marital Status:  Married  Human resources officer Violence: Not At Risk (01/18/2022)   Humiliation, Afraid, Rape, and Kick questionnaire    Fear of Current or Ex-Partner: No    Emotionally Abused: No    Physically Abused: No    Sexually Abused: No     Prior to Admission medications   Medication Sig Start Date End Date Taking? Authorizing Provider  albuterol (VENTOLIN HFA) 108 (90 Base) MCG/ACT inhaler Inhale 2 puffs  into the lungs every 4 (four) hours as needed for wheezing or shortness of breath.     [provider]  alendronate (FOSAMAX) 70 MG tablet Take 70 mg by mouth once a week.  Patient not taking: Reported on 01/04/2023    [provider]  amitriptyline (ELAVIL) 25 MG tablet Take 25 mg by mouth at bedtime as needed for sleep.     [provider]  aspirin EC 81 MG EC tablet Take 1 tablet (81 mg total) by mouth daily. Swallow whole. 06/16/20   [name], MD  azithromycin (ZITHROMAX) 250 MG tablet Take by mouth. Patient not taking: Reported on 07/08/2022 11/27/21   [provider]  baclofen (LIORESAL) 10 MG tablet Take 10 mg by mouth 4 (four) times daily as needed. Patient not taking: Reported on 01/04/2023    [provider]  BD INSULIN SYRINGE U/F 31G X 5/16" 1 ML MISC USE TWICE DAILY WITH INSULIN Patient not taking: Reported on 07/13/2022 10/09/21   [provider]  CELEBREX 100 MG capsule Take 100 mg by mouth 2 (two) times daily as needed. Patient not taking: Reported on 01/04/2023 02/18/22   [provider]  clopidogrel (PLAVIX) 75 MG tablet Take 1 tablet (75 mg total) by mouth daily with breakfast. 05/16/20   [name], [name], MD  Dulaglutide 3 MG/0.5ML SOPN Inject 3 mLs into the skin once a week. Every saturday 04/16/22   [provider]  gabapentin (NEURONTIN) 300 MG capsule Take 300 mg by mouth 2 (two) times daily.  Patient not taking: Reported on 07/08/2022 04/30/19   [provider]  indomethacin (INDOCIN) 50 MG capsule  Take 1 capsule (50 mg total) by mouth 2 (two) times daily with a meal. 02/24/22   [name], [name], [name]  insulin NPH-regular Human (NOVOLIN 70/30) (70-30) 100 UNIT/ML injection Inject 30 Units into the skin with breakfast, with lunch, and with evening meal.    [provider]  isosorbide mononitrate (IMDUR) 60 MG 24 hr tablet Take 60 mg by mouth 2 (two) times daily. 09/30/22   [provider]  losartan (COZAAR) 25 MG tablet Take 25 mg by mouth daily. 07/06/22   [provider]  meloxicam (MOBIC) 15 MG tablet Take 15 mg by mouth daily. Patient not taking: Reported on 01/04/2023 06/19/21   [provider]  metoCLOPramide (REGLAN) 5 MG tablet Take 5 mg by mouth daily. Patient not taking: Reported on 01/04/2023 03/31/21   [provider]  metoprolol succinate (TOPROL-XL) 100 MG 24 hr tablet Take 200 mg every am & 100 mg every pm 11/22/22   [provider]  Multiple Vitamin (MULTI-VITAMIN) tablet Take 1 tablet by mouth daily.    [provider]  [facility] VERIO test strip  09/06/20   [provider]  pantoprazole (PROTONIX) 40 MG tablet Take 40 mg by mouth daily.    [provider]  pravastatin (PRAVACHOL) 20 MG tablet Take 20 mg by mouth daily. 11/19/22   [provider]  saccharomyces boulardii (FLORASTOR) 250 MG capsule Take 250 mg by mouth 2 (two) times daily.  Patient not taking: Reported on 01/04/2023    [provider]  sertraline (ZOLOFT) 25 MG tablet Take 1 tablet (25 mg total) by mouth daily. 01/04/23   [name], MD  SPIRIVA HANDIHALER 18 MCG inhalation capsule Place 18 mcg into inhaler and inhale daily in the afternoon. Patient not taking: Reported on 01/04/2023 06/02/20   [provider]  traMADol Veatrice Bourbon)  50 MG tablet Take 50 mg by mouth every 6 (six) hours as needed for moderate pain.    [provider]  Vitamin A 2400 MCG (8000 UT) CAPS Take by mouth.    [provider]     Allergies  Allergen Reactions   Invokamet [Canagliflozin-Metformin Hcl] Other (See Comments)    Yeast Infection   Lovastatin Rash   Penicillins Rash    Has patient had a PCN reaction causing immediate rash, facial/tongue/throat swelling, SOB or lightheadedness with hypotension:Yes Has patient had a PCN reaction causing severe rash involving mucus membranes or skin necrosis:all over body Has patient had a PCN reaction that required hospitalization: No Has patient had a PCN reaction occurring within the last 10 years: No If all of the above answers are "NO", then may proceed with Cephalosporin use.    Vicodin [Hydrocodone-Acetaminophen] Rash    REVIEW OF SYSTEMS:  General: no fevers/chills/night sweats Eyes: no blurry vision, diplopia, or amaurosis ENT: no sore throat or hearing loss Resp: no cough, wheezing, or hemoptysis CV: no edema or palpitations GI: no abdominal pain, nausea, vomiting, diarrhea, or constipation GU: no dysuria, frequency, or hematuria Skin: no rash Neuro: no headache, numbness, tingling, or weakness of extremities Musculoskeletal: no joint pain or swelling Heme: no bleeding, DVT, or easy bruising Endo: no polydipsia or polyuria  BP (!) 152/78   Pulse 73   Ht '5\' 3"'$  (1.6 m)   Wt 191 lb 3.2 oz (86.7 kg)   SpO2 98%   BMI 33.87 kg/m   PHYSICAL EXAM: GEN:  AO x 3 in no acute distress HEENT: normal Dentition: Dentures Neck: JVP normal. +2 carotid upstrokes without bruits. No thyromegaly.  Left neck incision seen Lungs: equal expansion, clear bilaterally CV: Apex is discrete and nondisplaced, RRR with 3 out of 6 crescendo decrescendo murmur Abd: soft, non-tender, non-distended; no bruit; positive bowel sounds Ext: no edema, ecchymoses, or cyanosis Vascular: 2+ femoral pulses, 2+ radial pulses       Skin: warm and dry without rash Neuro: CN II-XII grossly intact; motor and sensory grossly intact    DATA AND STUDIES:  EKG: Sinus rhythm without  bundle-branch blocks  2D ECHO: January 2024 1. Left ventricular ejection fraction, by estimation, is 60 to 65%. The  left ventricle has normal function. The left ventricle has no regional  wall motion abnormalities. There is moderate left ventricular hypertrophy  of the basal-septal segment. Left  ventricular diastolic parameters are consistent with Grade I diastolic  dysfunction (impaired relaxation). Elevated left ventricular end-diastolic  pressure.   2. Right ventricular systolic function is normal. The right ventricular  size is normal. Tricuspid regurgitation signal is inadequate for assessing  PA pressure.   3. The mitral valve is normal in structure. Trivial mitral valve  regurgitation. No evidence of mitral stenosis.   4. The aortic valve is calcified. There is severe calcifcation of the  aortic valve. There is severe thickening of the aortic valve. Aortic valve  regurgitation is not visualized. Severe aortic valve stenosis. Aortic  valve area, by VTI measures 0.79 cm.  Aortic valve mean gradient measures 40.8 mmHg. Aortic valve Vmax measures  3.94 m/s.   5. The inferior vena cava is normal in size with greater than 50%  respiratory variability, suggesting right atrial pressure of 3 mmHg.   CARDIAC CATH: July 2023   Prox RCA lesion is 30% stenosed.   1st Diag-1 lesion is 75% stenosed.   1st Diag-2 lesion is 80% stenosed.  Prox LAD lesion is 55% stenosed.   The left ventricular systolic function is normal.   The left ventricular ejection fraction is 55-65% by visual estimate.  STS RISK CALCULATOR: Pending  NHYA CLASS: 3    ASSESSMENT AND PLAN:   Aortic valve stenosis, etiology of cardiac valve disease unspecified - Plan: CBC, Basic metabolic panel  Coronary artery disease of native artery of native heart with stable angina pectoris ([place]) - Plan: CBC, Basic metabolic panel  Type 2 diabetes mellitus with complication, with long-term current use of insulin  ([place])  Hypertension associated with diabetes ([place])  Hyperlipidemia associated with type 2 diabetes mellitus ([place])  Pre-procedure lab exam - Plan: CBC, Basic metabolic panel  The patient endorses NYHA class III symptoms of shortness of breath.  She also has developed exertional angina.  It is unclear to me whether she is anemic currently.  I will check a CBC today along with BMP in preparation for coronary angiography and right heart catheterization study.  I reviewed her angiographic images from over the summer.  She has a long diagonal that has a high-grade lesion in's body and is unclear to me whether this she has ostial diagonal disease whether the LAD is also disease somewhat.  If the ostial diagonal is involved this makes PCI a little bit more challenging.  If she has bifurcation disease this could be pursued with percutaneous revascularization versus surgical aortic valve replacement with CABG.  Certainly her history of remote stroke may suggest that she is a suboptimal surgical candidate however she has no residual deficits aside from loss of central vision on her left side.  Certainly if she has disease amenable to PCI that will be pursued.  However if her CBC today shows that she is severely anemic then I think this should be addressed first as this is probably exacerbating her angina.  Either way we will obtain at TAVR protocol CTA.  The patient has dentures so no dental evaluation is required.  Further recommendations regarding definitive treatment plan will be issued following review of her blood work, repeat coronary angiography study, and CT scan.  Additionally cardiothoracic surgical consult will be pursued.  I have personally reviewed the patients imaging data as summarized above.  I have reviewed the natural history of aortic stenosis with the patient and family members who are present today. We have discussed the limitations of medical therapy and the poor prognosis associated with  symptomatic aortic stenosis. We have also reviewed potential treatment options, including palliative medical therapy, conventional surgical aortic valve replacement, and transcatheter aortic valve replacement. We discussed treatment options in the context of this patient's specific comorbid medical conditions.   All of the patient's questions were answered today. Will make further recommendations based on the results of studies outlined above.   Total time spent with patient today 60 minutes. This includes reviewing records, evaluating the patient and coordinating care.   Early Osmond, MD  01/21/2023 1:28 PM    [facility] [address], [place], [place]  [address] Phone: [phone]; Fax: [phone]

## 2023-01-21 NOTE — Progress Notes (Signed)
Pre Surgical Assessment: 5 M Walk Test  27M=16.31f  5 Meter Walk Test- trial 1: 8.70 seconds 5 Meter Walk Test- trial 2: 10.08 seconds 5 Meter Walk Test- trial 3: 10.70 seconds 5 Meter Walk Test Average: 9.82 seconds

## 2023-01-21 NOTE — Progress Notes (Signed)
Patient ID: Joyce Potter MRN: [id number] DOB/AGE: 05/10/1946 77 y.o.  Primary Care Physician:Khan, Nyra Jabs, MD Primary Cardiologist: Ida Rogue, MD   FOCUSED CARDIOVASCULAR PROBLEM LIST:   1.  Severe aortic stenosis with an aortic valve area of 0.79 cm grade, mean gradient of 40.8 mmHg, and a peak velocity of 3.9 m/s with an ejection fraction of 60-65%; EKG shows sinus rhythm without bundle-branch blocks 2.  Longstanding iron deficiency anemia on chronic iron infusions 3.  Hypertension 4.  Coronary artery disease status post PCI of the mid right coronary in 2021; coronary angiography July 2023 demonstrated patent mid right coronary artery stent and high-grade disease of a large first diagonal 5.  Insulin-dependent diabetes mellitus 6.  Hyperlipidemia 7.  Stage II chronic kidney disease 8.  TIA s/p LCEA 2016    HISTORY OF PRESENT ILLNESS: The patient is a 77 y.o. female with the indicated medical history here for recommendations regarding her severe aortic stenosis.  She was seen by Dr. Rockey Situ recently.  With complaints of anginal symptoms as well as shortness of breath.  Her anginal symptoms seem to be worse when she is quite anemic.  She had undergone coronary angiography in July of last year which demonstrated diffuse moderate disease and high-grade disease of the long diagonal and medical therapy was pursued and DAPT started.  Her last hemoglobin in December was 9.9.  The patient tells me that she is not short of breath with minimal exertion.  She is not short of breath at rest.  She occasionally gets chest pain when she exerts herself as well.  She gets lightheaded when she goes from sitting to standing as well but has had no frank syncope.  She has not noticed any dark or bloody stools.  She is being followed by hematology and was told to call hematology when she felt unwell.  She has not had a repeat CBC done in more than a month.  She fortunately has not required any  emergency room visits or hospitalizations.  Her husband is currently in rehabilitation and she fears that she will not be able to take care of him at home due to her ongoing symptoms.  She does not smoke.  She has both upper and lower dentures.  Past Medical History:  Diagnosis Date   Anemia    iron deficiency   Aortic stenosis    Basal cell carcinoma    CAD (coronary artery disease)    s/p Left circumflex stent in 2012   Carotid stenosis    Cataract    High cholesterol    HTN (hypertension)    Hyperlipidemia    IDDM (insulin dependent diabetes mellitus)    Multiple thyroid nodules    Benign   Peptic ulcer disease    Retinal artery occlusion    on left    Past Surgical History:  Procedure Laterality Date   ABDOMINAL HYSTERECTOMY     ARTERY BIOPSY Left 11/19/2015   Procedure: BIOPSY TEMPORAL ARTERY;  Surgeon: Algernon Huxley, MD;  Location: [facility] ORS;  Service: Vascular;  Laterality: Left;   BREAST BIOPSY Left 2002   core- neg   BREAST EXCISIONAL BIOPSY     ??? not visible scar on neither breast- pt believes it to be right breast    CARDIAC CATHETERIZATION N/A 08/08/2015   Procedure: Right and Left Heart Cath and Coronary Angiography;  Surgeon: Teodoro Spray, MD;  Location: [place] [facility];  Service: Cardiovascular;  Laterality: N/A;  CARDIAC CATHETERIZATION Bilateral 09/03/2016   Procedure: Right/Left Heart Cath and Coronary Angiography;  Surgeon: Teodoro Spray, MD;  Location: [place] [facility];  Service: Cardiovascular;  Laterality: Bilateral;   CATARACT EXTRACTION W/ INTRAOCULAR LENS  IMPLANT, BILATERAL     COLONOSCOPY     in 2013- internal hemorrhoids   COLONOSCOPY WITH PROPOFOL N/A 07/07/2018   Procedure: COLONOSCOPY WITH PROPOFOL;  Surgeon: Manya Silvas, MD;  Location: [facility] ENDOSCOPY;  Service: Endoscopy;  Laterality: N/A;   COLONOSCOPY WITH PROPOFOL N/A 05/22/2020   Procedure: COLONOSCOPY WITH PROPOFOL;  Surgeon: Jonathon Bellows, MD;  Location: [facility] ENDOSCOPY;   Service: Gastroenterology;  Laterality: N/A;   CORONARY ANGIOGRAPHY N/A 09/14/2017   Procedure: CORONARY ANGIOGRAPHY;  Surgeon: Teodoro Spray, MD;  Location: [place] [facility];  Service: Cardiovascular;  Laterality: N/A;   CORONARY ANGIOPLASTY     CORONARY STENT INTERVENTION N/A 05/15/2020   Procedure: coronary angiography;  Surgeon: Teodoro Spray, MD;  Location: [place] [facility];  Service: Cardiovascular;  Laterality: N/A;   CORONARY STENT INTERVENTION N/A 05/15/2020   Procedure: CORONARY STENT INTERVENTION;  Surgeon: Isaias Cowman, MD;  Location: [place] [facility];  Service: Cardiovascular;  Laterality: N/A;   CORONARY STENT PLACEMENT     ENDARTERECTOMY Left 10/01/2015   Procedure: ENDARTERECTOMY CAROTID;  Surgeon: Algernon Huxley, MD;  Location: [facility]S;  Service: Vascular;  Laterality: Left;   ENTEROSCOPY N/A 07/24/2020   Procedure: ENTEROSCOPY;  Surgeon: Virgel Manifold, MD;  Location: [facility] ENDOSCOPY;  Service: Endoscopy;  Laterality: N/A;   ESOPHAGOGASTRODUODENOSCOPY     ESOPHAGOGASTRODUODENOSCOPY (EGD) WITH PROPOFOL N/A 05/31/2016   Procedure: ESOPHAGOGASTRODUODENOSCOPY (EGD) WITH PROPOFOL;  Surgeon: Manya Silvas, MD;  Location: [facility] ENDOSCOPY;  Service: Endoscopy;  Laterality: N/A;   ESOPHAGOGASTRODUODENOSCOPY (EGD) WITH PROPOFOL N/A 07/07/2018   Procedure: ESOPHAGOGASTRODUODENOSCOPY (EGD) WITH PROPOFOL;  Surgeon: Manya Silvas, MD;  Location: [facility] ENDOSCOPY;  Service: Endoscopy;  Laterality: N/A;   ESOPHAGOGASTRODUODENOSCOPY (EGD) WITH PROPOFOL N/A 07/17/2018   Procedure: ESOPHAGOGASTRODUODENOSCOPY (EGD) WITH PROPOFOL;  Surgeon: Lucilla Lame, MD;  Location: [facility]COPY;  Service: Endoscopy;  Laterality: N/A;   ESOPHAGOGASTRODUODENOSCOPY (EGD) WITH PROPOFOL N/A 05/21/2020   Procedure: ESOPHAGOGASTRODUODENOSCOPY (EGD) WITH PROPOFOL;  Surgeon: Jonathon Bellows, MD;  Location: [facility] ENDOSCOPY;  Service: Gastroenterology;  Laterality: N/A;   ESOPHAGOGASTRODUODENOSCOPY  (EGD) WITH PROPOFOL N/A 06/13/2020   Procedure: ESOPHAGOGASTRODUODENOSCOPY (EGD) WITH PROPOFOL;  Surgeon: Lucilla Lame, MD;  Location: [facility] ENDOSCOPY;  Service: Endoscopy;  Laterality: N/A;   EYE SURGERY     GIVENS CAPSULE STUDY N/A 04/17/2013   Procedure: GIVENS CAPSULE STUDY;  Surgeon: Arta Silence, MD;  Location: [facility] ENDOSCOPY;  Service: Endoscopy;  Laterality: N/A;  patient ate breakfast at [place] 06/11/2020   Procedure: GIVENS CAPSULE STUDY;  Surgeon: Virgel Manifold, MD;  Location: [facility] ENDOSCOPY;  Service: Endoscopy;  Laterality: N/A;   GIVENS CAPSULE STUDY N/A 01/29/2021   Procedure: GIVENS CAPSULE STUDY;  Surgeon: Virgel Manifold, MD;  Location: [facility] ENDOSCOPY;  Service: Endoscopy;  Laterality: N/A;   GIVENS CAPSULE STUDY N/A 08/24/2021   Procedure: GIVENS CAPSULE STUDY;  Surgeon: Virgel Manifold, MD;  Location: [facility] ENDOSCOPY;  Service: Endoscopy;  Laterality: N/A;   HEMORRHOID BANDING  07/27/2016   Dr Nicholes Stairs   LEFT HEART CATH AND CORONARY ANGIOGRAPHY N/A 07/08/2022   Procedure: LEFT HEART CATH AND CORONARY ANGIOGRAPHY;  Surgeon: Dionisio David, MD;  Location: [place] [facility];  Service: Cardiovascular;  Laterality: N/A;  PARTIAL HYSTERECTOMY     RIGHT AND LEFT HEART CATH Bilateral 09/14/2017   Procedure: RIGHT AND LEFT HEART CATH;  Surgeon: Teodoro Spray, MD;  Location: [place] [facility];  Service: Cardiovascular;  Laterality: Bilateral;   RIGHT/LEFT HEART CATH AND CORONARY ANGIOGRAPHY Right 05/15/2020   Procedure: Right/Left Heart cath;  Surgeon: Teodoro Spray, MD;  Location: [place] [facility];  Service: Cardiovascular;  Laterality: Right;   TRIGGER FINGER RELEASE      Family History  Problem Relation Age of Onset   Uterine cancer Mother    Breast cancer Mother 77   Seizures Father    Stroke Father    Diabetes Father    COPD Father    Colon cancer Neg Hx     Social History   Socioeconomic History   Marital status:  Married    Spouse name: [name]   Number of children: 5   Years of education: Not on file   Highest education level: Not on file  Occupational History   Occupation: retired   Tobacco Use   Smoking status: Former    Types: Cigarettes    Quit date: 02/15/1985    Years since quitting: 37.9   Smokeless tobacco: Never   Tobacco comments:    quit about 31 years ago  Vaping Use   Vaping Use: Never used  Substance and Sexual Activity   Alcohol use: No    Alcohol/week: 0.0 standard drinks of alcohol   Drug use: No   Sexual activity: Not on file  Other Topics Concern   Not on file  Social History Narrative   Lives at home independently.   Organ donor   Had 5 children- son timothy   Son died in 51   Social Determinants of Health   Financial Resource Strain: Low Risk  (05/31/2022)   Overall Financial Resource Strain (CARDIA)    Difficulty of Paying Living Expenses: Not hard at all  Food Insecurity: No Food Insecurity (02/18/2022)   Hunger Vital Sign    Worried About Running Out of Food in the Last Year: Never true    Ran Out of Food in the Last Year: Never true  Transportation Needs: No Transportation Needs (05/31/2022)   PRAPARE - Hydrologist (Medical): No    Lack of Transportation (Non-Medical): No  Physical Activity: Not on file  Stress: No Stress Concern Present (02/18/2022)   [place]    Feeling of Stress : Only a little  Social Connections: Socially Integrated (12/16/2021)   Social Connection and Isolation Panel [NHANES]    Frequency of Communication with Friends and Family: More than three times a week    Frequency of Social Gatherings with Friends and Family: More than three times a week    Attends Religious Services: More than 4 times per year    Active Member of [employer] or Organizations: Yes    Attends Music therapist: More than 4 times per year    Marital Status:  Married  Human resources officer Violence: Not At Risk (01/18/2022)   Humiliation, Afraid, Rape, and Kick questionnaire    Fear of Current or Ex-Partner: No    Emotionally Abused: No    Physically Abused: No    Sexually Abused: No     Prior to Admission medications   Medication Sig Start Date End Date Taking? Authorizing Provider  albuterol (VENTOLIN HFA) 108 (90 Base) MCG/ACT inhaler Inhale 2 puffs  into the lungs every 4 (four) hours as needed for wheezing or shortness of breath.     [provider]  alendronate (FOSAMAX) 70 MG tablet Take 70 mg by mouth once a week.  Patient not taking: Reported on 01/04/2023    [provider]  amitriptyline (ELAVIL) 25 MG tablet Take 25 mg by mouth at bedtime as needed for sleep.     [provider]  aspirin EC 81 MG EC tablet Take 1 tablet (81 mg total) by mouth daily. Swallow whole. 06/16/20   [name], MD  azithromycin (ZITHROMAX) 250 MG tablet Take by mouth. Patient not taking: Reported on 07/08/2022 11/27/21   [provider]  baclofen (LIORESAL) 10 MG tablet Take 10 mg by mouth 4 (four) times daily as needed. Patient not taking: Reported on 01/04/2023    [provider]  BD INSULIN SYRINGE U/F 31G X 5/16" 1 ML MISC USE TWICE DAILY WITH INSULIN Patient not taking: Reported on 07/13/2022 10/09/21   [provider]  CELEBREX 100 MG capsule Take 100 mg by mouth 2 (two) times daily as needed. Patient not taking: Reported on 01/04/2023 02/18/22   [provider]  clopidogrel (PLAVIX) 75 MG tablet Take 1 tablet (75 mg total) by mouth daily with breakfast. 05/16/20   [name], [name], MD  Dulaglutide 3 MG/0.5ML SOPN Inject 3 mLs into the skin once a week. Every saturday 04/16/22   [provider]  gabapentin (NEURONTIN) 300 MG capsule Take 300 mg by mouth 2 (two) times daily.  Patient not taking: Reported on 07/08/2022 04/30/19   [provider]  indomethacin (INDOCIN) 50 MG capsule  Take 1 capsule (50 mg total) by mouth 2 (two) times daily with a meal. 02/24/22   [name], [name], [name]  insulin NPH-regular Human (NOVOLIN 70/30) (70-30) 100 UNIT/ML injection Inject 30 Units into the skin with breakfast, with lunch, and with evening meal.    [provider]  isosorbide mononitrate (IMDUR) 60 MG 24 hr tablet Take 60 mg by mouth 2 (two) times daily. 09/30/22   [provider]  losartan (COZAAR) 25 MG tablet Take 25 mg by mouth daily. 07/06/22   [provider]  meloxicam (MOBIC) 15 MG tablet Take 15 mg by mouth daily. Patient not taking: Reported on 01/04/2023 06/19/21   [provider]  metoCLOPramide (REGLAN) 5 MG tablet Take 5 mg by mouth daily. Patient not taking: Reported on 01/04/2023 03/31/21   [provider]  metoprolol succinate (TOPROL-XL) 100 MG 24 hr tablet Take 200 mg every am & 100 mg every pm 11/22/22   [provider]  Multiple Vitamin (MULTI-VITAMIN) tablet Take 1 tablet by mouth daily.    [provider]  [facility] VERIO test strip  09/06/20   [provider]  pantoprazole (PROTONIX) 40 MG tablet Take 40 mg by mouth daily.    [provider]  pravastatin (PRAVACHOL) 20 MG tablet Take 20 mg by mouth daily. 11/19/22   [provider]  saccharomyces boulardii (FLORASTOR) 250 MG capsule Take 250 mg by mouth 2 (two) times daily.  Patient not taking: Reported on 01/04/2023    [provider]  sertraline (ZOLOFT) 25 MG tablet Take 1 tablet (25 mg total) by mouth daily. 01/04/23   [name], MD  SPIRIVA HANDIHALER 18 MCG inhalation capsule Place 18 mcg into inhaler and inhale daily in the afternoon. Patient not taking: Reported on 01/04/2023 06/02/20   [provider]  traMADol Veatrice Bourbon)  50 MG tablet Take 50 mg by mouth every 6 (six) hours as needed for moderate pain.    [provider]  Vitamin A 2400 MCG (8000 UT) CAPS Take by mouth.    [provider]     Allergies  Allergen Reactions   Invokamet [Canagliflozin-Metformin Hcl] Other (See Comments)    Yeast Infection   Lovastatin Rash   Penicillins Rash    Has patient had a PCN reaction causing immediate rash, facial/tongue/throat swelling, SOB or lightheadedness with hypotension:Yes Has patient had a PCN reaction causing severe rash involving mucus membranes or skin necrosis:all over body Has patient had a PCN reaction that required hospitalization: No Has patient had a PCN reaction occurring within the last 10 years: No If all of the above answers are "NO", then may proceed with Cephalosporin use.    Vicodin [Hydrocodone-Acetaminophen] Rash    REVIEW OF SYSTEMS:  General: no fevers/chills/night sweats Eyes: no blurry vision, diplopia, or amaurosis ENT: no sore throat or hearing loss Resp: no cough, wheezing, or hemoptysis CV: no edema or palpitations GI: no abdominal pain, nausea, vomiting, diarrhea, or constipation GU: no dysuria, frequency, or hematuria Skin: no rash Neuro: no headache, numbness, tingling, or weakness of extremities Musculoskeletal: no joint pain or swelling Heme: no bleeding, DVT, or easy bruising Endo: no polydipsia or polyuria  BP (!) 152/78   Pulse 73   Ht '5\' 3"'$  (1.6 m)   Wt 191 lb 3.2 oz (86.7 kg)   SpO2 98%   BMI 33.87 kg/m   PHYSICAL EXAM: GEN:  AO x 3 in no acute distress HEENT: normal Dentition: Dentures Neck: JVP normal. +2 carotid upstrokes without bruits. No thyromegaly.  Left neck incision seen Lungs: equal expansion, clear bilaterally CV: Apex is discrete and nondisplaced, RRR with 3 out of 6 crescendo decrescendo murmur Abd: soft, non-tender, non-distended; no bruit; positive bowel sounds Ext: no edema, ecchymoses, or cyanosis Vascular: 2+ femoral pulses, 2+ radial pulses       Skin: warm and dry without rash Neuro: CN II-XII grossly intact; motor and sensory grossly intact    DATA AND STUDIES:  EKG: Sinus rhythm without  bundle-branch blocks  2D ECHO: January 2024 1. Left ventricular ejection fraction, by estimation, is 60 to 65%. The  left ventricle has normal function. The left ventricle has no regional  wall motion abnormalities. There is moderate left ventricular hypertrophy  of the basal-septal segment. Left  ventricular diastolic parameters are consistent with Grade I diastolic  dysfunction (impaired relaxation). Elevated left ventricular end-diastolic  pressure.   2. Right ventricular systolic function is normal. The right ventricular  size is normal. Tricuspid regurgitation signal is inadequate for assessing  PA pressure.   3. The mitral valve is normal in structure. Trivial mitral valve  regurgitation. No evidence of mitral stenosis.   4. The aortic valve is calcified. There is severe calcifcation of the  aortic valve. There is severe thickening of the aortic valve. Aortic valve  regurgitation is not visualized. Severe aortic valve stenosis. Aortic  valve area, by VTI measures 0.79 cm.  Aortic valve mean gradient measures 40.8 mmHg. Aortic valve Vmax measures  3.94 m/s.   5. The inferior vena cava is normal in size with greater than 50%  respiratory variability, suggesting right atrial pressure of 3 mmHg.   CARDIAC CATH: July 2023   Prox RCA lesion is 30% stenosed.   1st Diag-1 lesion is 75% stenosed.   1st Diag-2 lesion is 80% stenosed.  Prox LAD lesion is 55% stenosed.   The left ventricular systolic function is normal.   The left ventricular ejection fraction is 55-65% by visual estimate.  STS RISK CALCULATOR: Pending  NHYA CLASS: 3    ASSESSMENT AND PLAN:   Aortic valve stenosis, etiology of cardiac valve disease unspecified - Plan: CBC, Basic metabolic panel  Coronary artery disease of native artery of native heart with stable angina pectoris ([place]) - Plan: CBC, Basic metabolic panel  Type 2 diabetes mellitus with complication, with long-term current use of insulin  ([place])  Hypertension associated with diabetes ([place])  Hyperlipidemia associated with type 2 diabetes mellitus ([place])  Pre-procedure lab exam - Plan: CBC, Basic metabolic panel  The patient endorses NYHA class III symptoms of shortness of breath.  She also has developed exertional angina.  It is unclear to me whether she is anemic currently.  I will check a CBC today along with BMP in preparation for coronary angiography and right heart catheterization study.  I reviewed her angiographic images from over the summer.  She has a long diagonal that has a high-grade lesion in's body and is unclear to me whether this she has ostial diagonal disease whether the LAD is also disease somewhat.  If the ostial diagonal is involved this makes PCI a little bit more challenging.  If she has bifurcation disease this could be pursued with percutaneous revascularization versus surgical aortic valve replacement with CABG.  Certainly her history of remote stroke may suggest that she is a suboptimal surgical candidate however she has no residual deficits aside from loss of central vision on her left side.  Certainly if she has disease amenable to PCI that will be pursued.  However if her CBC today shows that she is severely anemic then I think this should be addressed first as this is probably exacerbating her angina.  Either way we will obtain at TAVR protocol CTA.  The patient has dentures so no dental evaluation is required.  Further recommendations regarding definitive treatment plan will be issued following review of her blood work, repeat coronary angiography study, and CT scan.  Additionally cardiothoracic surgical consult will be pursued.  I have personally reviewed the patients imaging data as summarized above.  I have reviewed the natural history of aortic stenosis with the patient and family members who are present today. We have discussed the limitations of medical therapy and the poor prognosis associated with  symptomatic aortic stenosis. We have also reviewed potential treatment options, including palliative medical therapy, conventional surgical aortic valve replacement, and transcatheter aortic valve replacement. We discussed treatment options in the context of this patient's specific comorbid medical conditions.   All of the patient's questions were answered today. Will make further recommendations based on the results of studies outlined above.   Total time spent with patient today 60 minutes. This includes reviewing records, evaluating the patient and coordinating care.   Early Osmond, MD  01/21/2023 1:28 PM    [facility] [place], [place], [place]  [address] Phone: [phone]; Fax: [phone]

## 2023-01-22 LAB — BASIC METABOLIC PANEL
BUN/Creatinine Ratio: 17 (ref 12–28)
BUN: 15 mg/dL (ref 8–27)
CO2: 25 mmol/L (ref 20–29)
Calcium: 9.1 mg/dL (ref 8.7–10.3)
Chloride: 101 mmol/L (ref 96–106)
Creatinine, Ser: 0.88 mg/dL (ref 0.57–1.00)
Glucose: 169 mg/dL — ABNORMAL HIGH (ref 70–99)
Potassium: 3.9 mmol/L (ref 3.5–5.2)
Sodium: 142 mmol/L (ref 134–144)
eGFR: 68 mL/min/{1.73_m2} (ref >59)

## 2023-01-22 LAB — CBC
Hematocrit: 38.7 % (ref 34.0–46.6)
Hemoglobin: 12.4 g/dL (ref 11.1–15.9)
MCH: 26.7 pg (ref 26.6–33.0)
MCHC: 32 g/dL (ref 31.5–35.7)
MCV: 83 fL (ref 79–97)
Platelets: 224 10*3/uL (ref 150–450)
RBC: 4.64 x10E6/uL (ref 3.77–5.28)
RDW: 16.7 % — ABNORMAL HIGH (ref 11.7–15.4)
WBC: 5.4 10*3/uL (ref 3.4–10.8)

## 2023-01-25 ENCOUNTER — Telehealth: Payer: Self-pay | Admitting: Pharmacy Technician

## 2023-01-25 ENCOUNTER — Telehealth: Payer: Self-pay | Admitting: *Deleted

## 2023-01-25 DIAGNOSIS — Z596 Low income: Secondary | ICD-10-CM

## 2023-01-25 NOTE — Progress Notes (Signed)
[place] [facility])                                            [place] Team    01/25/2023  MERAL GEISSINGER [birth year] [id number]  Care coordination call placed to [facility] in regard to [facility] application.  Spoke to [place] who informs patient is APPROVED 01/10/23-12/27/23. She informs initial shipment has been delivered in January. Patient will need to call BI at [phone] approximately 2 weeks before running out of medication to reorder the next refill.This process will need to continue for the enrollment period.  Climmie Buelow P. Alma Muegge, [place]  [phone]

## 2023-01-25 NOTE — Telephone Encounter (Signed)
Cardiac Catheterization scheduled at [facility] for: Wednesday January 26, 2023 11:30 AM Arrival time and place: [place] Entrance A at: 9:30 AM  Nothing to eat after midnight prior to procedure, clear liquids until 5 AM day of procedure.  Medication instructions: -Hold:  Insulin-AM of procedure -Except hold medications usual morning medications can be taken with sips of water including aspirin 81 mg and Plavix 75 mg.  Confirmed patient has responsible adult to drive home post procedure and be with patient first 24 hours after arriving home. Patient reports her daughter will provide transportation at discharge and pt will have someone to be with her first 24 hours home.  Patient reports no new symptoms concerning for COVID-19 in the past 10 days.  Reviewed procedure instructions with patient.

## 2023-01-26 ENCOUNTER — Encounter (HOSPITAL_COMMUNITY)
Admission: RE | Disposition: A | Discharge status: Home / Self Care | Payer: Self-pay | Source: Home / Self Care | Attending: Internal Medicine

## 2023-01-26 ENCOUNTER — Other Ambulatory Visit: Payer: Self-pay

## 2023-01-26 ENCOUNTER — Ambulatory Visit (HOSPITAL_COMMUNITY)
Admission: RE | Admit: 2023-01-26 | Discharge: 2023-01-26 | Disposition: A | Discharge status: Home / Self Care | Payer: Medicare HMO | Attending: Internal Medicine | Admitting: Internal Medicine

## 2023-01-26 DIAGNOSIS — Z794 Long term (current) use of insulin: Secondary | ICD-10-CM | POA: Diagnosis not present

## 2023-01-26 DIAGNOSIS — E785 Hyperlipidemia, unspecified: Secondary | ICD-10-CM | POA: Insufficient documentation

## 2023-01-26 DIAGNOSIS — I350 Nonrheumatic aortic (valve) stenosis: Secondary | ICD-10-CM | POA: Diagnosis not present

## 2023-01-26 DIAGNOSIS — I25118 Atherosclerotic heart disease of native coronary artery with other forms of angina pectoris: Secondary | ICD-10-CM | POA: Diagnosis not present

## 2023-01-26 DIAGNOSIS — Z87891 Personal history of nicotine dependence: Secondary | ICD-10-CM | POA: Insufficient documentation

## 2023-01-26 DIAGNOSIS — R0609 Other forms of dyspnea: Secondary | ICD-10-CM | POA: Insufficient documentation

## 2023-01-26 DIAGNOSIS — E1169 Type 2 diabetes mellitus with other specified complication: Secondary | ICD-10-CM | POA: Insufficient documentation

## 2023-01-26 DIAGNOSIS — Z955 Presence of coronary angioplasty implant and graft: Secondary | ICD-10-CM | POA: Insufficient documentation

## 2023-01-26 DIAGNOSIS — D509 Iron deficiency anemia, unspecified: Secondary | ICD-10-CM | POA: Diagnosis not present

## 2023-01-26 DIAGNOSIS — I2510 Atherosclerotic heart disease of native coronary artery without angina pectoris: Secondary | ICD-10-CM

## 2023-01-26 DIAGNOSIS — E1122 Type 2 diabetes mellitus with diabetic chronic kidney disease: Secondary | ICD-10-CM | POA: Diagnosis not present

## 2023-01-26 DIAGNOSIS — Z8673 Personal history of transient ischemic attack (TIA), and cerebral infarction without residual deficits: Secondary | ICD-10-CM | POA: Insufficient documentation

## 2023-01-26 DIAGNOSIS — N182 Chronic kidney disease, stage 2 (mild): Secondary | ICD-10-CM | POA: Insufficient documentation

## 2023-01-26 DIAGNOSIS — I129 Hypertensive chronic kidney disease with stage 1 through stage 4 chronic kidney disease, or unspecified chronic kidney disease: Secondary | ICD-10-CM | POA: Diagnosis not present

## 2023-01-26 HISTORY — PX: RIGHT HEART CATH AND CORONARY ANGIOGRAPHY: CATH118264

## 2023-01-26 LAB — POCT I-STAT EG7
Acid-Base Excess: 0 mmol/L (ref 0.0–2.0)
Acid-Base Excess: 0 mmol/L (ref 0.0–2.0)
Bicarbonate: 26.6 mmol/L (ref 20.0–28.0)
Bicarbonate: 26.7 mmol/L (ref 20.0–28.0)
Calcium, Ion: 1.27 mmol/L (ref 1.15–1.40)
Calcium, Ion: 1.35 mmol/L (ref 1.15–1.40)
HCT: 31 % — ABNORMAL LOW (ref 36.0–46.0)
HCT: 30 % — ABNORMAL LOW (ref 36.0–46.0)
Hemoglobin: 10.5 g/dL — ABNORMAL LOW (ref 12.0–15.0)
Hemoglobin: 10.2 g/dL — ABNORMAL LOW (ref 12.0–15.0)
O2 Saturation: 61 %
O2 Saturation: 65 %
Potassium: 4 mmol/L (ref 3.5–5.1)
Potassium: 4 mmol/L (ref 3.5–5.1)
Sodium: 143 mmol/L (ref 135–145)
Sodium: 142 mmol/L (ref 135–145)
TCO2: 28 mmol/L (ref 22–32)
TCO2: 28 mmol/L (ref 22–32)
pCO2, Ven: 51 mmHg (ref 44–60)
pCO2, Ven: 51.5 mmHg (ref 44–60)
pH, Ven: 7.326 (ref 7.25–7.43)
pH, Ven: 7.322 (ref 7.25–7.43)
pO2, Ven: 35 mmHg (ref 32–45)
pO2, Ven: 37 mmHg (ref 32–45)

## 2023-01-26 LAB — POCT I-STAT 7, (LYTES, BLD GAS, ICA,H+H)
Acid-base deficit: 1 mmol/L (ref 0.0–2.0)
Bicarbonate: 25.2 mmol/L (ref 20.0–28.0)
Calcium, Ion: 1.25 mmol/L (ref 1.15–1.40)
HCT: 30 % — ABNORMAL LOW (ref 36.0–46.0)
Hemoglobin: 10.2 g/dL — ABNORMAL LOW (ref 12.0–15.0)
O2 Saturation: 92 %
Potassium: 4 mmol/L (ref 3.5–5.1)
Sodium: 142 mmol/L (ref 135–145)
TCO2: 27 mmol/L (ref 22–32)
pCO2 arterial: 46.2 mmHg (ref 32–48)
pH, Arterial: 7.345 — ABNORMAL LOW (ref 7.35–7.45)
pO2, Arterial: 68 mmHg — ABNORMAL LOW (ref 83–108)

## 2023-01-26 LAB — GLUCOSE, CAPILLARY: Glucose-Capillary: 92 mg/dL (ref 70–99)

## 2023-01-26 LAB — POCT ACTIVATED CLOTTING TIME: Activated Clotting Time: 293 seconds

## 2023-01-26 SURGERY — RIGHT HEART CATH AND CORONARY ANGIOGRAPHY
Anesthesia: LOCAL

## 2023-01-26 MED ORDER — HYDRALAZINE HCL 20 MG/ML IJ SOLN: 10.0000 mg | INTRAMUSCULAR | Status: DC | PRN

## 2023-01-26 MED ORDER — ACETAMINOPHEN 325 MG PO TABS: 650.0000 mg | ORAL_TABLET | ORAL | Status: DC | PRN

## 2023-01-26 MED ORDER — MIDAZOLAM HCL 2 MG/2ML IJ SOLN
INTRAMUSCULAR | Status: DC | PRN
  Administered 2023-01-26: 1 mg via INTRAVENOUS

## 2023-01-26 MED ORDER — SODIUM CHLORIDE 0.9% FLUSH: 3.0000 mL | INTRAVENOUS | Status: DC | PRN

## 2023-01-26 MED ORDER — LABETALOL HCL 5 MG/ML IV SOLN: 10.0000 mg | INTRAVENOUS | Status: DC | PRN

## 2023-01-26 MED ORDER — SODIUM CHLORIDE 0.9 % WEIGHT BASED INFUSION
3.0000 mL/kg/h | INTRAVENOUS | Status: AC
  Administered 2023-01-26: 3 mL/kg/h via INTRAVENOUS

## 2023-01-26 MED ORDER — VERAPAMIL HCL 2.5 MG/ML IV SOLN
INTRAVENOUS | Status: DC | PRN
  Administered 2023-01-26: 10 mL via INTRA_ARTERIAL

## 2023-01-26 MED ORDER — VERAPAMIL HCL 2.5 MG/ML IV SOLN
INTRAVENOUS | Status: AC
  Filled 2023-01-26: qty 2

## 2023-01-26 MED ORDER — FENTANYL CITRATE (PF) 100 MCG/2ML IJ SOLN
INTRAMUSCULAR | Status: AC
  Filled 2023-01-26: qty 2

## 2023-01-26 MED ORDER — SODIUM CHLORIDE 0.9 % IV SOLN: 250.0000 mL | INTRAVENOUS | Status: DC | PRN

## 2023-01-26 MED ORDER — SODIUM CHLORIDE 0.9% FLUSH: 3.0000 mL | Freq: Two times a day (BID) | INTRAVENOUS | Status: DC

## 2023-01-26 MED ORDER — SODIUM CHLORIDE 0.9 % WEIGHT BASED INFUSION: 1.0000 mL/kg/h | INTRAVENOUS | Status: DC

## 2023-01-26 MED ORDER — SODIUM CHLORIDE 0.9 % IV SOLN: INTRAVENOUS | Status: DC

## 2023-01-26 MED ORDER — ASPIRIN 81 MG PO CHEW: 81.0000 mg | CHEWABLE_TABLET | ORAL | Status: DC

## 2023-01-26 MED ORDER — HEPARIN (PORCINE) IN NACL 1000-0.9 UT/500ML-% IV SOLN
INTRAVENOUS | Status: AC
  Filled 2023-01-26: qty 1000

## 2023-01-26 MED ORDER — IOHEXOL 350 MG/ML SOLN
INTRAVENOUS | Status: DC | PRN
  Administered 2023-01-26: 60 mL

## 2023-01-26 MED ORDER — MIDAZOLAM HCL 2 MG/2ML IJ SOLN
INTRAMUSCULAR | Status: AC
  Filled 2023-01-26: qty 2

## 2023-01-26 MED ORDER — CLOPIDOGREL BISULFATE 75 MG PO TABS: 75.0000 mg | ORAL_TABLET | ORAL | Status: DC

## 2023-01-26 MED ORDER — HEPARIN SODIUM (PORCINE) 1000 UNIT/ML IJ SOLN
INTRAMUSCULAR | Status: AC
  Filled 2023-01-26: qty 10

## 2023-01-26 MED ORDER — LIDOCAINE HCL (PF) 1 % IJ SOLN
INTRAMUSCULAR | Status: DC | PRN
  Administered 2023-01-26 (×2): 2 mL

## 2023-01-26 MED ORDER — FENTANYL CITRATE (PF) 100 MCG/2ML IJ SOLN
INTRAMUSCULAR | Status: DC | PRN
  Administered 2023-01-26: 25 ug via INTRAVENOUS

## 2023-01-26 MED ORDER — ONDANSETRON HCL 4 MG/2ML IJ SOLN: 4.0000 mg | Freq: Four times a day (QID) | INTRAMUSCULAR | Status: DC | PRN

## 2023-01-26 MED ORDER — HEPARIN SODIUM (PORCINE) 1000 UNIT/ML IJ SOLN
INTRAMUSCULAR | Status: DC | PRN
  Administered 2023-01-26: 5000 [IU] via INTRA_ARTERIAL
  Administered 2023-01-26: 4000 [IU] via INTRA_ARTERIAL

## 2023-01-26 MED ORDER — LIDOCAINE HCL (PF) 1 % IJ SOLN
INTRAMUSCULAR | Status: AC
  Filled 2023-01-26: qty 30

## 2023-01-26 MED ORDER — HEPARIN (PORCINE) IN NACL 1000-0.9 UT/500ML-% IV SOLN
INTRAVENOUS | Status: DC | PRN
  Administered 2023-01-26 (×2): 500 mL

## 2023-01-26 SURGICAL SUPPLY — 15 items
CATH BALLN WEDGE 5F 110CM (CATHETERS) IMPLANT
CATH DIAG 6FR JR4 (CATHETERS) IMPLANT
CATH LAUNCHER 6FR EBU3.5 (CATHETERS) IMPLANT
DEVICE RAD COMP TR BAND LRG (VASCULAR PRODUCTS) IMPLANT
GLIDESHEATH SLEND SS 6F .021 (SHEATH) IMPLANT
GUIDEWIRE .025 260CM (WIRE) IMPLANT
GUIDEWIRE PRESSURE X 175 (WIRE) IMPLANT
KIT HEART LEFT (KITS) ×1 IMPLANT
PACK CARDIAC CATHETERIZATION (CUSTOM PROCEDURE TRAY) ×1 IMPLANT
PROTECTION STATION PRESSURIZED (MISCELLANEOUS) ×1
SHEATH GLIDE SLENDER 4/5FR (SHEATH) IMPLANT
STATION PROTECTION PRESSURIZED (MISCELLANEOUS) IMPLANT
TRANSDUCER W/STOPCOCK (MISCELLANEOUS) ×1 IMPLANT
TUBING CIL FLEX 10 FLL-RA (TUBING) ×1 IMPLANT
WIRE EMERALD 3MM-J .035X260CM (WIRE) IMPLANT

## 2023-01-26 NOTE — Progress Notes (Signed)
Per Dr. Ali Lowe may begin deflating TR band in 56mn from placement

## 2023-01-26 NOTE — Interval H&P Note (Signed)
History and Physical Interval Note:  01/26/2023 9:19 AM  Joyce Potter  has presented today for surgery, with the diagnosis of aortic stenosis, coronary artery disease.  The various methods of treatment have been discussed with the patient and family. After consideration of risks, benefits and other options for treatment, the patient has consented to  Procedure(s): RIGHT/LEFT HEART CATH AND CORONARY ANGIOGRAPHY (N/A) as a surgical intervention.  The patient's history has been reviewed, patient examined, no change in status, stable for surgery.  I have reviewed the patient's chart and labs.  Questions were answered to the patient's satisfaction.     Early Osmond

## 2023-01-26 NOTE — Progress Notes (Addendum)
TR BAND REMOVAL  LOCATION:    right radial  DEFLATED PER PROTOCOL:    Yes.    TIME BAND OFF / DRESSING APPLIED:    1630 gauze dressing applied   SITE UPON ARRIVAL:    Level 1  SITE AFTER BAND REMOVAL:    Level 0  CIRCULATION SENSATION AND MOVEMENT:    Within Normal Limits   Yes.    COMMENTS:   pt bled and had to have TR band repositioned, no other complications

## 2023-01-26 NOTE — Discharge Instructions (Signed)

## 2023-01-27 ENCOUNTER — Encounter (HOSPITAL_COMMUNITY): Payer: Self-pay | Admitting: Internal Medicine

## 2023-01-31 ENCOUNTER — Other Ambulatory Visit: Payer: Self-pay | Admitting: Internal Medicine

## 2023-01-31 ENCOUNTER — Other Ambulatory Visit: Payer: Self-pay | Admitting: Cardiovascular Disease

## 2023-01-31 DIAGNOSIS — N951 Menopausal and female climacteric states: Secondary | ICD-10-CM

## 2023-02-02 ENCOUNTER — Telehealth: Payer: Self-pay | Admitting: Internal Medicine

## 2023-02-02 ENCOUNTER — Encounter: Payer: Self-pay | Admitting: Internal Medicine

## 2023-02-02 ENCOUNTER — Ambulatory Visit
Admission: RE | Admit: 2023-02-02 | Discharge: 2023-02-02 | Disposition: A | Discharge status: Home / Self Care | Payer: Medicare HMO | Source: Ambulatory Visit | Attending: Internal Medicine | Admitting: Internal Medicine

## 2023-02-02 ENCOUNTER — Ambulatory Visit: Payer: Medicare HMO | Attending: Internal Medicine | Admitting: Internal Medicine

## 2023-02-02 ENCOUNTER — Other Ambulatory Visit
Admission: RE | Admit: 2023-02-02 | Discharge: 2023-02-02 | Disposition: A | Discharge status: Home / Self Care | Payer: Medicare HMO | Source: Ambulatory Visit | Attending: Internal Medicine | Admitting: Internal Medicine

## 2023-02-02 ENCOUNTER — Other Ambulatory Visit: Payer: Self-pay

## 2023-02-02 ENCOUNTER — Telehealth: Payer: Self-pay | Admitting: Cardiovascular Disease

## 2023-02-02 ENCOUNTER — Other Ambulatory Visit: Payer: Self-pay | Admitting: Internal Medicine

## 2023-02-02 ENCOUNTER — Encounter: Payer: Self-pay | Admitting: Oncology

## 2023-02-02 VITALS — BP 130/80 | HR 70 | Ht 63.0 in | Wt 193.0 lb

## 2023-02-02 DIAGNOSIS — I25118 Atherosclerotic heart disease of native coronary artery with other forms of angina pectoris: Secondary | ICD-10-CM

## 2023-02-02 DIAGNOSIS — S40021A Contusion of right upper arm, initial encounter: Secondary | ICD-10-CM | POA: Insufficient documentation

## 2023-02-02 DIAGNOSIS — I350 Nonrheumatic aortic (valve) stenosis: Secondary | ICD-10-CM

## 2023-02-02 DIAGNOSIS — Z9889 Other specified postprocedural states: Secondary | ICD-10-CM | POA: Insufficient documentation

## 2023-02-02 DIAGNOSIS — M79601 Pain in right arm: Secondary | ICD-10-CM | POA: Diagnosis present

## 2023-02-02 DIAGNOSIS — R2231 Localized swelling, mass and lump, right upper limb: Secondary | ICD-10-CM | POA: Diagnosis present

## 2023-02-02 LAB — CBC
HCT: 30.9 % — ABNORMAL LOW (ref 36.0–46.0)
Hemoglobin: 9.8 g/dL — ABNORMAL LOW (ref 12.0–15.0)
MCH: 27.5 pg (ref 26.0–34.0)
MCHC: 31.7 g/dL (ref 30.0–36.0)
MCV: 86.8 fL (ref 80.0–100.0)
Platelets: 288 10*3/uL (ref 150–400)
RBC: 3.56 MIL/uL — ABNORMAL LOW (ref 3.87–5.11)
RDW: 16.9 % — ABNORMAL HIGH (ref 11.5–15.5)
WBC: 6.7 10*3/uL (ref 4.0–10.5)
nRBC: 0 % (ref 0.0–0.2)

## 2023-02-02 NOTE — Telephone Encounter (Signed)
Patient reports swelling and some redness at cath site from procedure on 01/26/23. She states swelling is about grape or marble sized and is tender to touch. She state bruising is improving. Advised that it would be appropriate to have it assessed by provider. She states she will call Dr Donivan Scull office as it located closer to her than our office.  Advised to call back if unable to see a provider closer to home.

## 2023-02-02 NOTE — Telephone Encounter (Signed)
Patient says that arm is continuing to get bigger from when she had the catherization done in right arm and turning a light color

## 2023-02-02 NOTE — Telephone Encounter (Signed)
I spoke with Ms. Colantonio regarding the results of her right upper extremity arterial ultrasound.  There was evidence of a small resolving hematoma near the radial arteriotomy site but no evidence of active bleeding, pseudoaneurysm, or dissection.  Her CBC shows moderate anemia, fairly similar to the time of her recent heart catheterization.  No thrombocytopenia noted.  I recommended that she try to elevate her right arm and discontinue clopidogrel as discussed earlier today.  She should remain on aspirin 81 mg daily.  I advised her to reach out to [place] or seek immediate medical attention in the nearest ED if she has worsening right arm pain swelling or other concerns related to the site.  Nelva Bush, MD [facility]

## 2023-02-02 NOTE — Patient Instructions (Addendum)
Medication Instructions:  Your physician recommends the following medication changes.  STOP TAKING: Plavix 75 mg daily  *If you need a refill on your cardiac medications before your next appointment, please call your pharmacy*   Lab Work: Your provider would like for you to have following labs drawn (CBC)  Please go to the [facility] entrance and check in at the front desk.  You do not need an appointment.  They are open from 7am-6 pm.     Testing/Procedures: Your physician has requested that you have a upper extremity arterial duplex. This test is an ultrasound of the arteries in the legs or arms. It looks at arterial blood flow in the legs and arms. Allow one hour for Lower and Upper Arterial scans. There are no restrictions or special instructions [facility]   Follow-Up: At [facility], you and your health needs are our priority.  As part of our continuing mission to provide you with exceptional heart care, we have created designated Provider Care Teams.  These Care Teams include your primary Cardiologist (physician) and Advanced Practice Providers (APPs -  Physician Assistants and Nurse Practitioners) who all work together to provide you with the care you need, when you need it.  We recommend signing up for the patient portal called "MyChart".  Sign up information is provided on this After Visit Summary.  MyChart is used to connect with patients for Virtual Visits (Telemedicine).  Patients are able to view lab/test results, encounter notes, upcoming appointments, etc.  Non-urgent messages can be sent to your provider as well.   To learn more about what you can do with MyChart, go to [email or web address].    Your next appointment:   2 week(s)  Provider:   You may see Ida Rogue, MD or one of the following Advanced Practice Providers on your designated Care Team:   Murray Hodgkins, NP Christell Faith, PA-C Cadence Kathlen Mody, PA-C Gerrie Nordmann, NP

## 2023-02-02 NOTE — Progress Notes (Signed)
  [facility]ENTER   MULTIDISCIPLINARY HEART VALVE TEAM  The pt's cardiac cath images were reviewed during 2/6 meeting.  Due to the complexity of the patient's CAD the team felt she should undergo evaluation for CABG/AVR. A CT of chest without contrast was ordered and this is scheduled on 02/15/23 at [facility] and the pt will see Dr [name] on 02/21/2023 for surgical evaluation. The pt is aware of appointments.

## 2023-02-02 NOTE — Telephone Encounter (Signed)
New Message:      Patient says she had a Cath on 01-26-23. Where she had her Cath, she now has a knot, swollen a lot, and it is really sore.

## 2023-02-02 NOTE — Progress Notes (Signed)
Follow-up Outpatient Visit Date: 02/02/2023  Primary Care Provider: Pcp, No No address on file  Chief Complaint: Right arm pain and swelling  HPI:  Joyce Potter is a 77 y.o. female with history of severe aortic stenosis, coronary artery disease by recent cardiac catheterization, hypertension, hyperlipidemia, type 2 diabetes mellitus, TIA s/p left CEA (2016), iron-deficiency anemia, and chronic kidney disease, who presents for evaluation of swelling, redness, and warmth at side of cardiac catheterization done last week by Dr. Ali Lowe.  Joyce Potter notes that there was some bleeding at the arteriotomy site that required additional pressure after the procedure.  She had some bruising and tenderness around the right radial cath site after leaving the hospital.  Two days ago, she noticed more focal swelling and tenderness around the radial artery puncture site.  This prompted her to reach out to [place] today and seek evaluation.  Notably, on the drive from her her home to the office, she began to have soreness and itching overlying the posterior lateral aspect of the proximal right forearm.  She now notes bruising and swelling there as well with interval improvement in the swelling at the radial cath site.  She denies any trauma to the right forearm.  She has not had any fevers or chills.  Chronic exertional dyspnea is unchanged from baseline.  She denies chest pain and lightheadedness.  She is scheduled for surgical consultation on 02/21/2023 with Dr. Lavonna Monarch.  --------------------------------------------------------------------------------------------------  Past Medical History:  Diagnosis Date   Anemia    iron deficiency   Aortic stenosis    Basal cell carcinoma    CAD (coronary artery disease)    s/p Left circumflex stent in 2012   Carotid stenosis    Cataract    High cholesterol    HTN (hypertension)    Hyperlipidemia    IDDM (insulin dependent diabetes mellitus)    Multiple thyroid nodules     Benign   Peptic ulcer disease    Retinal artery occlusion    on left   Past Surgical History:  Procedure Laterality Date   ABDOMINAL HYSTERECTOMY     ARTERY BIOPSY Left 11/19/2015   Procedure: BIOPSY TEMPORAL ARTERY;  Surgeon: Algernon Huxley, MD;  Location: [facility];  Service: Vascular;  Laterality: Left;   BREAST BIOPSY Left 2002   core- neg   BREAST EXCISIONAL BIOPSY     ??? not visible scar on neither breast- pt believes it to be right breast    CARDIAC CATHETERIZATION N/A 08/08/2015   Procedure: Right and Left Heart Cath and Coronary Angiography;  Surgeon: Teodoro Spray, MD;  Location: [place] [facility];  Service: Cardiovascular;  Laterality: N/A;   CARDIAC CATHETERIZATION Bilateral 09/03/2016   Procedure: Right/Left Heart Cath and Coronary Angiography;  Surgeon: Teodoro Spray, MD;  Location: [place] [facility];  Service: Cardiovascular;  Laterality: Bilateral;   CATARACT EXTRACTION W/ INTRAOCULAR LENS  IMPLANT, BILATERAL     COLONOSCOPY     in 2013- internal hemorrhoids   COLONOSCOPY WITH PROPOFOL N/A 07/07/2018   Procedure: COLONOSCOPY WITH PROPOFOL;  Surgeon: Manya Silvas, MD;  Location: [facility] ENDOSCOPY;  Service: Endoscopy;  Laterality: N/A;   COLONOSCOPY WITH PROPOFOL N/A 05/22/2020   Procedure: COLONOSCOPY WITH PROPOFOL;  Surgeon: Jonathon Bellows, MD;  Location: [facility] ENDOSCOPY;  Service: Gastroenterology;  Laterality: N/A;   CORONARY ANGIOGRAPHY N/A 09/14/2017   Procedure: CORONARY ANGIOGRAPHY;  Surgeon: Teodoro Spray, MD;  Location: [place] [facility];  Service: Cardiovascular;  Laterality: N/A;  CORONARY ANGIOPLASTY     CORONARY STENT INTERVENTION N/A 05/15/2020   Procedure: coronary angiography;  Surgeon: Teodoro Spray, MD;  Location: [place] [facility];  Service: Cardiovascular;  Laterality: N/A;   CORONARY STENT INTERVENTION N/A 05/15/2020   Procedure: CORONARY STENT INTERVENTION;  Surgeon: Isaias Cowman, MD;  Location: [place] [facility] LAB;  Service:  Cardiovascular;  Laterality: N/A;   CORONARY STENT PLACEMENT     ENDARTERECTOMY Left 10/01/2015   Procedure: ENDARTERECTOMY CAROTID;  Surgeon: Algernon Huxley, MD;  Location: [facility];  Service: Vascular;  Laterality: Left;   ENTEROSCOPY N/A 07/24/2020   Procedure: ENTEROSCOPY;  Surgeon: Virgel Manifold, MD;  Location: [facility] ENDOSCOPY;  Service: Endoscopy;  Laterality: N/A;   ESOPHAGOGASTRODUODENOSCOPY     ESOPHAGOGASTRODUODENOSCOPY (EGD) WITH PROPOFOL N/A 05/31/2016   Procedure: ESOPHAGOGASTRODUODENOSCOPY (EGD) WITH PROPOFOL;  Surgeon: Manya Silvas, MD;  Location: [facility]COPY;  Service: Endoscopy;  Laterality: N/A;   ESOPHAGOGASTRODUODENOSCOPY (EGD) WITH PROPOFOL N/A 07/07/2018   Procedure: ESOPHAGOGASTRODUODENOSCOPY (EGD) WITH PROPOFOL;  Surgeon: Manya Silvas, MD;  Location: [facility] ENDOSCOPY;  Service: Endoscopy;  Laterality: N/A;   ESOPHAGOGASTRODUODENOSCOPY (EGD) WITH PROPOFOL N/A 07/17/2018   Procedure: ESOPHAGOGASTRODUODENOSCOPY (EGD) WITH PROPOFOL;  Surgeon: Lucilla Lame, MD;  Location: [facility] ENDOSCOPY;  Service: Endoscopy;  Laterality: N/A;   ESOPHAGOGASTRODUODENOSCOPY (EGD) WITH PROPOFOL N/A 05/21/2020   Procedure: ESOPHAGOGASTRODUODENOSCOPY (EGD) WITH PROPOFOL;  Surgeon: Jonathon Bellows, MD;  Location: [facility] ENDOSCOPY;  Service: Gastroenterology;  Laterality: N/A;   ESOPHAGOGASTRODUODENOSCOPY (EGD) WITH PROPOFOL N/A 06/13/2020   Procedure: ESOPHAGOGASTRODUODENOSCOPY (EGD) WITH PROPOFOL;  Surgeon: Lucilla Lame, MD;  Location: [facility] ENDOSCOPY;  Service: Endoscopy;  Laterality: N/A;   EYE SURGERY     GIVENS CAPSULE STUDY N/A 04/17/2013   Procedure: GIVENS CAPSULE STUDY;  Surgeon: Arta Silence, MD;  Location: [facility] ENDOSCOPY;  Service: Endoscopy;  Laterality: N/A;  patient ate breakfast at [place] 06/11/2020   Procedure: GIVENS CAPSULE STUDY;  Surgeon: Virgel Manifold, MD;  Location: [facility] ENDOSCOPY;  Service: Endoscopy;  Laterality: N/A;   GIVENS CAPSULE STUDY N/A  01/29/2021   Procedure: GIVENS CAPSULE STUDY;  Surgeon: Virgel Manifold, MD;  Location: [facility] ENDOSCOPY;  Service: Endoscopy;  Laterality: N/A;   GIVENS CAPSULE STUDY N/A 08/24/2021   Procedure: GIVENS CAPSULE STUDY;  Surgeon: Virgel Manifold, MD;  Location: [facility] ENDOSCOPY;  Service: Endoscopy;  Laterality: N/A;   HEMORRHOID BANDING  07/27/2016   Dr Nicholes Stairs   LEFT HEART CATH AND CORONARY ANGIOGRAPHY N/A 07/08/2022   Procedure: LEFT HEART CATH AND CORONARY ANGIOGRAPHY;  Surgeon: Dionisio David, MD;  Location: [place] [facility];  Service: Cardiovascular;  Laterality: N/A;   PARTIAL HYSTERECTOMY     RIGHT AND LEFT HEART CATH Bilateral 09/14/2017   Procedure: RIGHT AND LEFT HEART CATH;  Surgeon: Teodoro Spray, MD;  Location: [place] [facility];  Service: Cardiovascular;  Laterality: Bilateral;   RIGHT HEART CATH AND CORONARY ANGIOGRAPHY N/A 01/26/2023   Procedure: RIGHT HEART CATH AND CORONARY ANGIOGRAPHY;  Surgeon: Early Osmond, MD;  Location: [place] [facility];  Service: Cardiovascular;  Laterality: N/A;   RIGHT/LEFT HEART CATH AND CORONARY ANGIOGRAPHY Right 05/15/2020   Procedure: Right/Left Heart cath;  Surgeon: Teodoro Spray, MD;  Location: [place] [facility];  Service: Cardiovascular;  Laterality: Right;   TRIGGER FINGER RELEASE      Current Meds  Medication Sig   albuterol (VENTOLIN HFA) 108 (90 Base) MCG/ACT inhaler Inhale 2 puffs into the lungs  every 4 (four) hours as needed for wheezing or shortness of breath.    amitriptyline (ELAVIL) 25 MG tablet Take 25 mg by mouth at bedtime as needed for sleep.    aspirin EC 81 MG EC tablet Take 1 tablet (81 mg total) by mouth daily. Swallow whole.   BD INSULIN SYRINGE U/F 31G X 5/16" 1 ML MISC    clopidogrel (PLAVIX) 75 MG tablet Take 1 tablet (75 mg total) by mouth daily with breakfast.   Dulaglutide 3 MG/0.5ML SOPN Inject 3 mg into the skin every Saturday.   insulin NPH-regular Human (NOVOLIN 70/30) (70-30) 100 UNIT/ML  injection Inject 30 Units into the skin with breakfast, with lunch, and with evening meal.   isosorbide mononitrate (IMDUR) 60 MG 24 hr tablet Take 60 mg by mouth 2 (two) times daily.   losartan (COZAAR) 25 MG tablet Take 25 mg by mouth daily.   metoprolol succinate (TOPROL-XL) 100 MG 24 hr tablet Take 100-200 mg by mouth See admin instructions. Take 200 mg by mouth in the morning and 100 mg in the evening   Multiple Vitamin (MULTI-VITAMIN) tablet Take 1 tablet by mouth daily.   ONETOUCH VERIO test strip    pantoprazole (PROTONIX) 40 MG tablet Take 40 mg by mouth daily.   sertraline (ZOLOFT) 25 MG tablet Take 1 tablet (25 mg total) by mouth daily.   SPIRIVA HANDIHALER 18 MCG inhalation capsule Place 18 mcg into inhaler and inhale at bedtime.   traMADol (ULTRAM) 50 MG tablet Take 50 mg by mouth every 6 (six) hours as needed for moderate pain.   Vitamin A 2400 MCG (8000 UT) CAPS Take 8,000 Units by mouth daily.    Allergies: Invokamet [canagliflozin-metformin hcl], Lovastatin, Penicillins, and Vicodin [hydrocodone-acetaminophen]  Social History   Tobacco Use   Smoking status: Former    Types: Cigarettes    Quit date: 02/15/1985    Years since quitting: 37.9   Smokeless tobacco: Never   Tobacco comments:    quit about 31 years ago  Vaping Use   Vaping Use: Never used  Substance Use Topics   Alcohol use: No    Alcohol/week: 0.0 standard drinks of alcohol   Drug use: No    Family History  Problem Relation Age of Onset   Uterine cancer Mother    Breast cancer Mother 21   Seizures Father    Stroke Father    Diabetes Father    COPD Father    Colon cancer Neg Hx     Review of Systems: A 12-system review of systems was performed and was negative except as noted in the HPI.  --------------------------------------------------------------------------------------------------  Physical Exam: BP 130/80 (BP Location: Left Arm, Patient Position: Sitting, Cuff Size: Normal)   Pulse 70    Ht '5\' 3"'$  (1.6 m)   Wt 193 lb (87.5 kg)   SpO2 98%   BMI 34.19 kg/m   General:  NAD. Neck: No JVD or HJR. Lungs: Clear to auscultation bilaterally without wheezes or crackles. Heart: Regular rate and rhythm with 2/6 systolic murmur. Abdomen: Soft, nontender, nondistended. Extremities: No lower extremity edema.  Right radial arteriotomy site is well-healed.  There is question small hematoma at the site with some surrounding bruising.  No bruit is appreciated.  The distal forearm is tender but soft.  There is notable bruising overlying the posterolateral aspect of the right forearm.  Area is tender to palpation.  There is normal sensation in the hand.  The right hand is warm with normal  capillary refill.  EKG: Normal sinus rhythm with LVH, septal infarct, and lateral ST/T changes.  Lateral ST/T changes are more pronounced than on tracing from 01/26/2023 but similar to the last EKG from our office in 01/04/2023.  Lab Results  Component Value Date   WBC 5.4 01/21/2023   HGB 10.5 (L) 01/26/2023   HCT 31.0 (L) 01/26/2023   MCV 83 01/21/2023   PLT 224 01/21/2023    Lab Results  Component Value Date   NA 143 01/26/2023   K 4.0 01/26/2023   CL 101 01/21/2023   CO2 25 01/21/2023   BUN 15 01/21/2023   CREATININE 0.88 01/21/2023   GLUCOSE 169 (H) 01/21/2023   ALT 14 07/21/2020    Lab Results  Component Value Date   CHOL 130 05/21/2020   HDL 35 (L) 05/21/2020   LDLCALC 79 05/21/2020   TRIG 81 05/21/2020   CHOLHDL 3.7 05/21/2020    --------------------------------------------------------------------------------------------------  ASSESSMENT AND PLAN: Right forearm hematoma: Joyce Potter has two areas of concern on exam today.  There is a tiny hematoma with associated tenderness near the right radial arteriotomy site.  I do not appreciate a bruit.  Surrounding bruising is likely from immediate post cath bleeding.  The pain and tenderness with associated swelling more proximal in the  forearm appears to be more recent, having just started this afternoon.  I observe the site for 10 to 15 minutes and did not notice any obvious expansion of the hematoma there.  Her right forearm/hand appears neurovascularly intact.  I have spoken with Dr. Ali Lowe, and we have agreed to arrange for an urgent arterial ultrasound of the right forearm to assess for vascular injury.  If this is unrevealing, I would favor conservative management with elevation of the arm.  Joyce Potter can use acetaminophen for analgesia.  I will have her stop taking clopidogrel as she is more than 12 months out from her most recent PCI.  I will also check a CBC to ensure that she has not suddenly become thrombocytopenic.  I have advised her that if the forearm swelling worsens or she develops numbness, pallor, or weakness in the right forearm/hand, she should go to the nearest ER for further evaluation.  Coronary artery disease with stable angina and aortic stenosis: Chronic exertional dyspnea stable.  No angina reported.  We will continue with her current antianginal regimen as well as aspirin and statin therapy.  As above, we will discontinue clopidogrel with right forearm bleeding as well as likely need for surgical AVR/CABG pending upcoming cardiac surgery consultation.  Follow-up: Return to clinic in 2 weeks.  Nelva Bush, MD 02/02/2023 3:46 PM

## 2023-02-02 NOTE — Telephone Encounter (Signed)
Returned call to patient who is s/p cath 01/26/2023.  Pt has complaint of arm swelling, redness and warmth on the side of arm used for cath.  Pt states she has had issues with the arm since the procedure.  Appt scheduled with Dr. Saunders Revel (DOD) today.  Georgana Curio MHA RN CCM

## 2023-02-04 ENCOUNTER — Other Ambulatory Visit: Payer: Self-pay

## 2023-02-04 DIAGNOSIS — F419 Anxiety disorder, unspecified: Secondary | ICD-10-CM

## 2023-02-04 MED ORDER — SERTRALINE HCL 25 MG PO TABS: 25.0000 mg | ORAL_TABLET | Freq: Every day | ORAL | 0 refills | Status: DC

## 2023-02-08 ENCOUNTER — Other Ambulatory Visit: Payer: Self-pay

## 2023-02-11 NOTE — Telephone Encounter (Signed)
Patient called confirming she will keep her upcoming appt.

## 2023-02-12 NOTE — Progress Notes (Unsigned)
Cardiology Office Note    Date:  02/16/2023   ID:  Joyce Potter 1946/08/18, MRN [id number]  PCP:  [facility]  Cardiologist:  Ida Rogue, MD  Electrophysiologist:  None   Chief Complaint: Follow up  History of Present Illness:   Joyce Potter is a 77 y.o. female with history of CAD status post remote PCI, severe aortic stenosis, TIA status post left CEA in 2016, DM2, HTN, HLD, and iron deficiency anemia who presents for follow up of right forearm contusion.  She was previously followed by [facility] cardiology with remote cardiac catheterizations performed by them as outlined in CV studies.  Echo in 02/2022 showed an EF greater than 55%, mild LVH, normal RV systolic function, mild aortic insufficiency, moderate aortic stenosis with a mean gradient of 37.2 mmHg and a valve area of 1.0 cm, and trivial mitral regurgitation.  She underwent LHC in 06/2022, by Dr. Humphrey Rolls, which showed proximal LAD 55% stenosis, sequential 75 and 80% stenosis in the first diagonal, and proximal RCA 30% stenosis.  EF 55 to 65%.  Medical management was recommended.  She established with Dr. Rockey Situ in 12/2022, noting continued exertional dyspnea, chest tightness, and fatigue.  Given symptoms, she underwent echo in 12/2022 demonstrated an EF of 60 to 65%, no regional wall motion abnormalities the age of the basal septal segment, grade 1 diastolic dysfunction, normal RV systolic function and ventricular cavity size, trivial mitral regurgitation, calcified aortic valve with severe stenosis with a mean gradient of 40.8 mmHg and a valve area of 0.79 cm.  She was referred to the structural heart clinic.  R/LHC in 12/2022 showed proximal LAD 55% stenosis (the lesion was previously treated), sequential 95 to 90% stenosis in the D1, patent proximal RCA stent with minimal in-stent restenosis of 20% stenosis.  It was recommended the patient undergo multidisciplinary review regarding SAVR with CABG  versus TAVR and bifurcation stenting.  She was evaluated by Dr. Saunders Revel earlier this month with swelling and tenderness along the right radial arteriotomy site as well as some discomfort and pruritus over the posterior lateral aspect of the proximal right forearm.  She was without symptoms of progressive angina or exertional dyspnea.  Subsequent right upper extremity arterial ultrasound on 02/02/2023 showed no evidence of radial artery thrombosis, dissection, or pseudoaneurysm.  There was a complex fluid collection in the subcutaneous soft tissues just superficial to the radial artery felt to likely represent resolving hemorrhage.  She is scheduled to be evaluated by CVTS on 02/21/2023.  She notes an improvement in her right forearm hematoma and pruritus.  Bruising is almost completely resolved.  No further bleeding.  No swelling.  She does continue to note some exertional chest tightness that radiates along the anterior aspects of her shoulders across her chest as well as some dizziness with presyncope with overexertion.  She has been without symptoms of frank syncope.  No progressive lower extremity swelling or orthopnea.  No falls or bleeding concerns.  She did note an improvement in myalgias/arthralgias following the discontinuation of atorvastatin.  She reports her PCP has recently obtained a lipid panel, we do not have that for review at this time.   Labs independently reviewed: 01/2023 - Hgb 9.8, PLT 288 12/2022 - BUN 15, serum creatinine 0.88, potassium 3.9 07/2022 - A1c 6.5 12/2021 - albumin 4.2, AST/ALT normal 12/2020 - TC 174, TG 93, HDL 45, LDL 110 07/2018 - TSH normal  Past Medical History:  Diagnosis Date   Anemia  iron deficiency   Aortic stenosis    Basal cell carcinoma    CAD (coronary artery disease)    s/p Left circumflex stent in 2012   Carotid stenosis    Cataract    High cholesterol    HTN (hypertension)    Hyperlipidemia    IDDM (insulin dependent diabetes mellitus)     Multiple thyroid nodules    Benign   Peptic ulcer disease    Retinal artery occlusion    on left    Past Surgical History:  Procedure Laterality Date   ABDOMINAL HYSTERECTOMY     ARTERY BIOPSY Left 11/19/2015   Procedure: BIOPSY TEMPORAL ARTERY;  Surgeon: Algernon Huxley, MD;  Location: [facility] ORS;  Service: Vascular;  Laterality: Left;   BREAST BIOPSY Left 2002   core- neg   BREAST EXCISIONAL BIOPSY     ??? not visible scar on neither breast- pt believes it to be right breast    CARDIAC CATHETERIZATION N/A 08/08/2015   Procedure: Right and Left Heart Cath and Coronary Angiography;  Surgeon: Teodoro Spray, MD;  Location: [place] CV LAB;  Service: Cardiovascular;  Laterality: N/A;   CARDIAC CATHETERIZATION Bilateral 09/03/2016   Procedure: Right/Left Heart Cath and Coronary Angiography;  Surgeon: Teodoro Spray, MD;  Location: [place] CV LAB;  Service: Cardiovascular;  Laterality: Bilateral;   CATARACT EXTRACTION W/ INTRAOCULAR LENS  IMPLANT, BILATERAL     COLONOSCOPY     in 2013- internal hemorrhoids   COLONOSCOPY WITH PROPOFOL N/A 07/07/2018   Procedure: COLONOSCOPY WITH PROPOFOL;  Surgeon: Manya Silvas, MD;  Location: [facility] ENDOSCOPY;  Service: Endoscopy;  Laterality: N/A;   COLONOSCOPY WITH PROPOFOL N/A 05/22/2020   Procedure: COLONOSCOPY WITH PROPOFOL;  Surgeon: Jonathon Bellows, MD;  Location: [facility] ENDOSCOPY;  Service: Gastroenterology;  Laterality: N/A;   CORONARY ANGIOGRAPHY N/A 09/14/2017   Procedure: CORONARY ANGIOGRAPHY;  Surgeon: Teodoro Spray, MD;  Location: [place] CV LAB;  Service: Cardiovascular;  Laterality: N/A;   CORONARY ANGIOPLASTY     CORONARY STENT INTERVENTION N/A 05/15/2020   Procedure: coronary angiography;  Surgeon: Teodoro Spray, MD;  Location: [place] CV LAB;  Service: Cardiovascular;  Laterality: N/A;   CORONARY STENT INTERVENTION N/A 05/15/2020   Procedure: CORONARY STENT INTERVENTION;  Surgeon: Isaias Cowman, MD;  Location: [place] CV LAB;  Service: Cardiovascular;  Laterality: N/A;   CORONARY STENT PLACEMENT     ENDARTERECTOMY Left 10/01/2015   Procedure: ENDARTERECTOMY CAROTID;  Surgeon: Algernon Huxley, MD;  Location: [facility] ORS;  Service: Vascular;  Laterality: Left;   ENTEROSCOPY N/A 07/24/2020   Procedure: ENTEROSCOPY;  Surgeon: Virgel Manifold, MD;  Location: [facility] ENDOSCOPY;  Service: Endoscopy;  Laterality: N/A;   ESOPHAGOGASTRODUODENOSCOPY     ESOPHAGOGASTRODUODENOSCOPY (EGD) WITH PROPOFOL N/A 05/31/2016   Procedure: ESOPHAGOGASTRODUODENOSCOPY (EGD) WITH PROPOFOL;  Surgeon: Manya Silvas, MD;  Location: [facility] ENDOSCOPY;  Service: Endoscopy;  Laterality: N/A;   ESOPHAGOGASTRODUODENOSCOPY (EGD) WITH PROPOFOL N/A 07/07/2018   Procedure: ESOPHAGOGASTRODUODENOSCOPY (EGD) WITH PROPOFOL;  Surgeon: Manya Silvas, MD;  Location: [facility] ENDOSCOPY;  Service: Endoscopy;  Laterality: N/A;   ESOPHAGOGASTRODUODENOSCOPY (EGD) WITH PROPOFOL N/A 07/17/2018   Procedure: ESOPHAGOGASTRODUODENOSCOPY (EGD) WITH PROPOFOL;  Surgeon: Lucilla Lame, MD;  Location: [facility] ENDOSCOPY;  Service: Endoscopy;  Laterality: N/A;   ESOPHAGOGASTRODUODENOSCOPY (EGD) WITH PROPOFOL N/A 05/21/2020   Procedure: ESOPHAGOGASTRODUODENOSCOPY (EGD) WITH PROPOFOL;  Surgeon: Jonathon Bellows, MD;  Location: [facility] ENDOSCOPY;  Service: Gastroenterology;  Laterality: N/A;   ESOPHAGOGASTRODUODENOSCOPY (EGD) WITH PROPOFOL N/A 06/13/2020  Procedure: ESOPHAGOGASTRODUODENOSCOPY (EGD) WITH PROPOFOL;  Surgeon: Lucilla Lame, MD;  Location: [facility] ENDOSCOPY;  Service: Endoscopy;  Laterality: N/A;   EYE SURGERY     GIVENS CAPSULE STUDY N/A 04/17/2013   Procedure: GIVENS CAPSULE STUDY;  Surgeon: Arta Silence, MD;  Location: [facility] ENDOSCOPY;  Service: Endoscopy;  Laterality: N/A;  patient ate breakfast at [place] 06/11/2020   Procedure: GIVENS CAPSULE STUDY;  Surgeon: Virgel Manifold, MD;  Location: [facility] ENDOSCOPY;  Service: Endoscopy;  Laterality: N/A;    GIVENS CAPSULE STUDY N/A 01/29/2021   Procedure: GIVENS CAPSULE STUDY;  Surgeon: Virgel Manifold, MD;  Location: [facility] ENDOSCOPY;  Service: Endoscopy;  Laterality: N/A;   GIVENS CAPSULE STUDY N/A 08/24/2021   Procedure: GIVENS CAPSULE STUDY;  Surgeon: Virgel Manifold, MD;  Location: [facility] ENDOSCOPY;  Service: Endoscopy;  Laterality: N/A;   HEMORRHOID BANDING  07/27/2016   Dr Nicholes Stairs   LEFT HEART CATH AND CORONARY ANGIOGRAPHY N/A 07/08/2022   Procedure: LEFT HEART CATH AND CORONARY ANGIOGRAPHY;  Surgeon: Dionisio David, MD;  Location: [place] CV LAB;  Service: Cardiovascular;  Laterality: N/A;   PARTIAL HYSTERECTOMY     RIGHT AND LEFT HEART CATH Bilateral 09/14/2017   Procedure: RIGHT AND LEFT HEART CATH;  Surgeon: Teodoro Spray, MD;  Location: [place] CV LAB;  Service: Cardiovascular;  Laterality: Bilateral;   RIGHT HEART CATH AND CORONARY ANGIOGRAPHY N/A 01/26/2023   Procedure: RIGHT HEART CATH AND CORONARY ANGIOGRAPHY;  Surgeon: Early Osmond, MD;  Location: [place] CV LAB;  Service: Cardiovascular;  Laterality: N/A;   RIGHT/LEFT HEART CATH AND CORONARY ANGIOGRAPHY Right 05/15/2020   Procedure: Right/Left Heart cath;  Surgeon: Teodoro Spray, MD;  Location: [place] CV LAB;  Service: Cardiovascular;  Laterality: Right;   TRIGGER FINGER RELEASE      Current Medications: Current Meds  Medication Sig   albuterol (VENTOLIN HFA) 108 (90 Base) MCG/ACT inhaler Inhale 2 puffs into the lungs every 4 (four) hours as needed for wheezing or shortness of breath.    amitriptyline (ELAVIL) 25 MG tablet Take 25 mg by mouth at bedtime as needed for sleep.    aspirin EC 81 MG EC tablet Take 1 tablet (81 mg total) by mouth daily. Swallow whole.   BD INSULIN SYRINGE U/F 31G X 5/16" 1 ML MISC    Dulaglutide 3 MG/0.5ML SOPN Inject 3 mg into the skin every Saturday.   Evolocumab (REPATHA SURECLICK) XX123456 MG/ML SOAJ Inject 140 mg into the skin every 14 (fourteen) days.   insulin  NPH-regular Human (NOVOLIN 70/30) (70-30) 100 UNIT/ML injection Inject 30 Units into the skin with breakfast, with lunch, and with evening meal.   isosorbide mononitrate (IMDUR) 60 MG 24 hr tablet Take 60 mg by mouth 2 (two) times daily.   losartan (COZAAR) 25 MG tablet Take 25 mg by mouth daily.   metoprolol succinate (TOPROL-XL) 100 MG 24 hr tablet Take 100-200 mg by mouth See admin instructions. Take 200 mg by mouth in the morning and 100 mg in the evening   Multiple Vitamin (MULTI-VITAMIN) tablet Take 1 tablet by mouth daily.   ONETOUCH VERIO test strip    pantoprazole (PROTONIX) 40 MG tablet Take 40 mg by mouth daily.   sertraline (ZOLOFT) 25 MG tablet Take 1 tablet (25 mg total) by mouth daily.   SPIRIVA HANDIHALER 18 MCG inhalation capsule Place 18 mcg into inhaler and inhale at bedtime.   traMADol (ULTRAM) 50 MG tablet  Take 50 mg by mouth every 6 (six) hours as needed for moderate pain.   Vitamin A 2400 MCG (8000 UT) CAPS Take 8,000 Units by mouth daily.    Allergies:   Invokamet [canagliflozin-metformin hcl], Lovastatin, Penicillins, and Vicodin [hydrocodone-acetaminophen]   Social History   Socioeconomic History   Marital status: Married    Spouse name: [name]   Number of children: 5   Years of education: Not on file   Highest education level: Not on file  Occupational History   Occupation: retired   Tobacco Use   Smoking status: Former    Types: Cigarettes    Quit date: 02/15/1985    Years since quitting: 38.0   Smokeless tobacco: Never   Tobacco comments:    quit about 31 years ago  Vaping Use   Vaping Use: Never used  Substance and Sexual Activity   Alcohol use: No    Alcohol/week: 0.0 standard drinks of alcohol   Drug use: No   Sexual activity: Not on file  Other Topics Concern   Not on file  Social History Narrative   Lives at home independently.   Organ donor   Had 5 children- son timothy   Son died in 37   Social Determinants of Health   Financial  Resource Strain: Low Risk  (05/31/2022)   Overall Financial Resource Strain (CARDIA)    Difficulty of Paying Living Expenses: Not hard at all  Food Insecurity: No Food Insecurity (02/18/2022)   Hunger Vital Sign    Worried About Running Out of Food in the Last Year: Never true    Ran Out of Food in the Last Year: Never true  Transportation Needs: No Transportation Needs (05/31/2022)   PRAPARE - Hydrologist (Medical): No    Lack of Transportation (Non-Medical): No  Physical Activity: Not on file  Stress: No Stress Concern Present (02/18/2022)   [place]    Feeling of Stress : Only a little  Social Connections: Socially Integrated (12/16/2021)   Social Connection and Isolation Panel [NHANES]    Frequency of Communication with Friends and Family: More than three times a week    Frequency of Social Gatherings with Friends and Family: More than three times a week    Attends Religious Services: More than 4 times per year    Active Member of [employer] or Organizations: Yes    Attends Music therapist: More than 4 times per year    Marital Status: Married     Family History:  The patient's family history includes Breast cancer (age of onset: 70) in her mother; COPD in her father; Diabetes in her father; Seizures in her father; Stroke in her father; Uterine cancer in her mother. There is no history of Colon cancer.  ROS:   12-point review of symptoms is negative unless otherwise noted in HPI.   EKGs/Labs/Other Studies Reviewed:    Studies reviewed were summarized above. The additional studies were reviewed today:  Right upper extremity arterial ultrasound 02/02/2023: IMPRESSION: 1. No evidence of radial artery thrombosis, dissection or pseudoaneurysm after recent access for cardiac catheterization. 2. Complex fluid collection in the subcutaneous soft tissues just superficial to the radial  artery measuring 2.5 x 0.5 x 1.0 cm likely represents resolving hemorrhage. __________  [facility] 01/26/2023:   Prox LAD lesion is 55% stenosed.   Prox RCA lesion is 20% stenosed.   1st Diag-1 lesion is 95% stenosed.  1st Diag-2 lesion is 90% stenosed.   1.  High-grade ostial and mid body diagonal disease with RFR positive proximal LAD disease (0.86). 2.  Patent proximal right coronary artery stent with minimal in-stent restenosis.   Recommendation: Will conduct multidisciplinary review regarding surgical aortic valve replacement with CABG versus TAVR and bifurcation stenting. __________  2D echo 01/20/2023: 1. Left ventricular ejection fraction, by estimation, is 60 to 65%. The  left ventricle has normal function. The left ventricle has no regional  wall motion abnormalities. There is moderate left ventricular hypertrophy  of the basal-septal segment. Left  ventricular diastolic parameters are consistent with Grade I diastolic  dysfunction (impaired relaxation). Elevated left ventricular end-diastolic  pressure.   2. Right ventricular systolic function is normal. The right ventricular  size is normal. Tricuspid regurgitation signal is inadequate for assessing  PA pressure.   3. The mitral valve is normal in structure. Trivial mitral valve  regurgitation. No evidence of mitral stenosis.   4. The aortic valve is calcified. There is severe calcifcation of the  aortic valve. There is severe thickening of the aortic valve. Aortic valve  regurgitation is not visualized. Severe aortic valve stenosis. Aortic  valve area, by VTI measures 0.79 cm.  Aortic valve mean gradient measures 40.8 mmHg. Aortic valve Vmax measures  3.94 m/s.   5. The inferior vena cava is normal in size with greater than 50%  respiratory variability, suggesting right atrial pressure of 3 mmHg.  __________  See CV Procedures in Epic for more remote studies by outside cardiology groups.    EKG:  EKG is ordered  today.  The EKG ordered today demonstrates NSR, 77 bpm, poor R wave progression along the precordial leads with nonspecific lateral ST-T changes more pronounced when compared to prior tracing  Recent Labs: 01/21/2023: BUN 15; Creatinine, Ser 0.88 01/26/2023: Potassium 4.0; Sodium 143 02/02/2023: Hemoglobin 9.8; Platelets 288  Recent Lipid Panel    Component Value Date/Time   CHOL 130 05/21/2020 1008   TRIG 81 05/21/2020 1008   HDL 35 (L) 05/21/2020 1008   CHOLHDL 3.7 05/21/2020 1008   VLDL 16 05/21/2020 1008   LDLCALC 79 05/21/2020 1008    PHYSICAL EXAM:    VS:  BP (!) 148/70 (BP Location: Left Arm, Patient Position: Sitting, Cuff Size: Normal)   Pulse 77   Ht 5' 3"$  (1.6 m)   Wt 194 lb (88 kg)   SpO2 98%   BMI 34.37 kg/m   BMI: Body mass index is 34.37 kg/m.  Physical Exam Vitals reviewed.  Constitutional:      Appearance: She is well-developed.  HENT:     Head: Normocephalic and atraumatic.  Eyes:     General:        Right eye: No discharge.        Left eye: No discharge.  Neck:     Vascular: No JVD.  Cardiovascular:     Rate and Rhythm: Normal rate and regular rhythm.     Heart sounds: S1 normal and S2 normal. Heart sounds not distant. No midsystolic click and no opening snap. Murmur heard.     Harsh midsystolic murmur is present with a grade of 3/6 at the upper right sternal border radiating to the neck.     No friction rub.     Comments: Right radial arteriotomy site has healed well, without bleeding, swelling, warmth, erythema, or TTP.  Radial pulse 2+.  No bruit.  Resolving ecchymosis.  Pulmonary:     Effort:  Pulmonary effort is normal. No respiratory distress.     Breath sounds: Normal breath sounds. No decreased breath sounds, wheezing or rales.  Chest:     Chest wall: No tenderness.  Abdominal:     General: There is no distension.  Musculoskeletal:     Cervical back: Normal range of motion.  Skin:    General: Skin is warm and dry.     Nails: There is no  clubbing.  Neurological:     Mental Status: She is alert and oriented to person, place, and time.  Psychiatric:        Speech: Speech normal.        Behavior: Behavior normal.        Thought Content: Thought content normal.        Judgment: Judgment normal.     Wt Readings from Last 3 Encounters:  02/16/23 194 lb (88 kg)  02/02/23 193 lb (87.5 kg)  01/26/23 189 lb (85.7 kg)     ASSESSMENT & PLAN:   CAD involving the native coronary arteries with stable angina with severe aortic stenosis: Currently undergoing multidisciplinary team evaluation for revascularization and valvular repair options.  She remains symptomatic with exertion.  She is scheduled to see CVTS on 02/21/2023.  Continue risk factor modification and current medical therapy including aspirin, isosorbide mononitrate, losartan, and metoprolol succinate.  We will look to add a PCSK9 inhibitor as outlined below.  Follow-up with primary cardiologist and CVTS as directed for ongoing management.  HLD: LDL 110 in 12/2020 with goal LDL being less than 55.  She has noted cramping in her lower extremities on statin.  At her visit in 12/2022, statin holiday was recommended with noted improvement in myalgias and arthralgias.  Trial of Repatha.  Attempted to obtain updated lipid panel from PCP.  DM2: A1c 6.5.  Ongoing management PCP.  Iron deficiency anemia: Last Hgb 9.8.  No symptoms concerning for bleeding.  Ongoing management per PCP.  Right forearm hematoma: Upper extremity ultrasound earlier this month showed no evidence of radial artery thrombus, dissection, or pseudoaneurysm.  A resolving complex fluid collection was noted superficial to the radial artery felt to likely represent resolving hemorrhage.  Resolved.    Disposition: F/u with Dr. Rockey Situ or an APP after revascularization and valve repair.   Medication Adjustments/Labs and Tests Ordered: Current medicines are reviewed at length with the patient today.  Concerns regarding  medicines are outlined above. Medication changes, Labs and Tests ordered today are summarized above and listed in the Patient Instructions accessible in Encounters.   Signed, Christell Faith, PA-C 02/16/2023 3:36 PM     [place] [address] [place] [address] [place], [place] [address] [phone]

## 2023-02-15 ENCOUNTER — Ambulatory Visit
Admission: RE | Admit: 2023-02-15 | Discharge: 2023-02-15 | Disposition: A | Discharge status: Home / Self Care | Payer: Medicare HMO | Source: Ambulatory Visit | Attending: Internal Medicine | Admitting: Internal Medicine

## 2023-02-15 DIAGNOSIS — I3481 Nonrheumatic mitral (valve) annulus calcification: Secondary | ICD-10-CM | POA: Insufficient documentation

## 2023-02-15 DIAGNOSIS — X58XXXA Exposure to other specified factors, initial encounter: Secondary | ICD-10-CM | POA: Insufficient documentation

## 2023-02-15 DIAGNOSIS — S2242XA Multiple fractures of ribs, left side, initial encounter for closed fracture: Secondary | ICD-10-CM | POA: Diagnosis not present

## 2023-02-15 DIAGNOSIS — I350 Nonrheumatic aortic (valve) stenosis: Secondary | ICD-10-CM | POA: Diagnosis not present

## 2023-02-15 DIAGNOSIS — R918 Other nonspecific abnormal finding of lung field: Secondary | ICD-10-CM | POA: Insufficient documentation

## 2023-02-15 DIAGNOSIS — I25118 Atherosclerotic heart disease of native coronary artery with other forms of angina pectoris: Secondary | ICD-10-CM | POA: Insufficient documentation

## 2023-02-15 DIAGNOSIS — I700 Atherosclerosis of aorta: Secondary | ICD-10-CM | POA: Insufficient documentation

## 2023-02-16 ENCOUNTER — Encounter: Payer: Self-pay | Admitting: Physician Assistant

## 2023-02-16 ENCOUNTER — Ambulatory Visit: Payer: Medicare HMO | Attending: Physician Assistant | Admitting: Physician Assistant

## 2023-02-16 VITALS — BP 148/70 | HR 77 | Ht 63.0 in | Wt 194.0 lb

## 2023-02-16 DIAGNOSIS — I25118 Atherosclerotic heart disease of native coronary artery with other forms of angina pectoris: Secondary | ICD-10-CM | POA: Diagnosis not present

## 2023-02-16 DIAGNOSIS — S40021D Contusion of right upper arm, subsequent encounter: Secondary | ICD-10-CM

## 2023-02-16 DIAGNOSIS — E118 Type 2 diabetes mellitus with unspecified complications: Secondary | ICD-10-CM

## 2023-02-16 DIAGNOSIS — I350 Nonrheumatic aortic (valve) stenosis: Secondary | ICD-10-CM | POA: Diagnosis not present

## 2023-02-16 DIAGNOSIS — D500 Iron deficiency anemia secondary to blood loss (chronic): Secondary | ICD-10-CM

## 2023-02-16 DIAGNOSIS — E785 Hyperlipidemia, unspecified: Secondary | ICD-10-CM

## 2023-02-16 DIAGNOSIS — Z794 Long term (current) use of insulin: Secondary | ICD-10-CM

## 2023-02-16 MED ORDER — REPATHA SURECLICK 140 MG/ML ~~LOC~~ SOAJ: 140.0000 mg | SUBCUTANEOUS | 11 refills | Status: DC

## 2023-02-16 NOTE — Patient Instructions (Signed)
Medication Instructions:  Your physician has recommended you make the following change in your medication:   START Repatha 140 mg every 2 weeks.   *If you need a refill on your cardiac medications before your next appointment, please call your pharmacy*   Lab Work: None  If you have labs (blood work) drawn today and your tests are completely normal, you will receive your results only by: [place] (if you have MyChart) OR A paper copy in the mail If you have any lab test that is abnormal or we need to change your treatment, we will call you to review the results.   Testing/Procedures: None   Follow-Up: At [facility], you and your health needs are our priority.  As part of our continuing mission to provide you with exceptional heart care, we have created designated Provider Care Teams.  These Care Teams include your primary Cardiologist (physician) and Advanced Practice Providers (APPs -  Physician Assistants and Nurse Practitioners) who all work together to provide you with the care you need, when you need it.   Your next appointment:   Follow up after surgery  Provider:   Ida Rogue, MD or Christell Faith, PA-C

## 2023-02-17 ENCOUNTER — Telehealth: Payer: Self-pay

## 2023-02-17 NOTE — Telephone Encounter (Signed)
Prior authorization initiated via CoverMyMeds for Repatha SureClick [id number] MG/ML auto-injectors. Key: [id number]  Response: We have approved the requested  coverage for the following prescription drug(s): REPATHA SURECLICK Soln Auto-inj Type of coverage approved: Prior Authorization This approval authorizes your coverage from 12/27/2022 - 12/27/2023

## 2023-02-21 ENCOUNTER — Encounter: Payer: Self-pay | Admitting: *Deleted

## 2023-02-21 ENCOUNTER — Institutional Professional Consult (permissible substitution) (INDEPENDENT_AMBULATORY_CARE_PROVIDER_SITE_OTHER): Payer: Medicare HMO | Admitting: Thoracic Surgery (Cardiothoracic Vascular Surgery)

## 2023-02-21 ENCOUNTER — Other Ambulatory Visit (INDEPENDENT_AMBULATORY_CARE_PROVIDER_SITE_OTHER): Payer: Self-pay | Admitting: Vascular Surgery

## 2023-02-21 ENCOUNTER — Encounter: Payer: Self-pay | Admitting: Thoracic Surgery (Cardiothoracic Vascular Surgery)

## 2023-02-21 ENCOUNTER — Other Ambulatory Visit: Payer: Self-pay | Admitting: *Deleted

## 2023-02-21 VITALS — BP 167/71 | HR 75 | Resp 18 | Ht 63.0 in | Wt 193.0 lb

## 2023-02-21 DIAGNOSIS — I350 Nonrheumatic aortic (valve) stenosis: Secondary | ICD-10-CM

## 2023-02-21 DIAGNOSIS — I739 Peripheral vascular disease, unspecified: Secondary | ICD-10-CM

## 2023-02-21 DIAGNOSIS — I6523 Occlusion and stenosis of bilateral carotid arteries: Secondary | ICD-10-CM

## 2023-02-21 DIAGNOSIS — I25118 Atherosclerotic heart disease of native coronary artery with other forms of angina pectoris: Secondary | ICD-10-CM

## 2023-02-21 NOTE — Progress Notes (Signed)
[address]       [place] [address]             [phone]           Joyce Potter  Medical Record [id number] Date of Birth: 09/20/1946  Joyce Merritts, [facility]  Chief Complaint: CP and DOE    History of Present Illness:     Pt is a very pleasant 77 yo female who has been having progressive exertional CP and DOE. Pt has progressed to where she is having these symptoms with minimal exertion and does not have at rest. She has a history of PCI of RCA following angina and has done well till recently. She also has had GI bleeding with resolution after colonoscopy however her iron levels have not stabilized and she requires iron infusions on occasion and her anemia will cause her symptoms to worsen. Her HCT was 30 last checked. 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 She has had a recent work up with echo with severe AS with a mean gradient of 81mHg. She has normal LV function. She had a cath with significant LAD stenosis with tight Diag stenosis felt to be a difficult PCI if attempted. Pt was felt best served with surgical revascularization and SAVR      Past Medical History:  Diagnosis Date   Anemia    iron deficiency   Aortic stenosis    Basal cell carcinoma    CAD (coronary artery disease)    s/p Left circumflex stent in 2012   Carotid stenosis    Cataract    High cholesterol    HTN (hypertension)    Hyperlipidemia    IDDM (insulin dependent diabetes mellitus)    Multiple thyroid nodules    Benign   Peptic ulcer disease    Retinal artery occlusion    on left    Past Surgical History:  Procedure Laterality Date   ABDOMINAL HYSTERECTOMY     ARTERY BIOPSY Left 11/19/2015   Procedure: BIOPSY TEMPORAL ARTERY;  Surgeon: JAlgernon Huxley MD;  Location: [facility];  Service: Vascular;   Laterality: Left;   BREAST BIOPSY Left 2002   core- neg   BREAST EXCISIONAL BIOPSY     ??? not visible scar on neither breast- pt believes it to be right breast    CARDIAC CATHETERIZATION N/A 08/08/2015   Procedure: Right and Left Heart Cath and Coronary Angiography;  Surgeon: KTeodoro Spray MD;  [facility];  Service: Cardiovascular;  Laterality: N/A;   CARDIAC CATHETERIZATION Bilateral 09/03/2016   Procedure: Right/Left Heart Cath and Coronary Angiography;  Surgeon: KTeodoro Spray MD;  [facility];  Service: Cardiovascular;  Laterality: Bilateral;   CATARACT EXTRACTION W/ INTRAOCULAR LENS  IMPLANT, BILATERAL     COLONOSCOPY     in 2013- internal hemorrhoids   COLONOSCOPY WITH PROPOFOL N/A 07/07/2018   Procedure: COLONOSCOPY WITH PROPOFOL;  Surgeon: EManya Silvas MD;  [facility];  Service: Endoscopy;  Laterality: N/A;   COLONOSCOPY WITH PROPOFOL N/A 05/22/2020   Procedure: COLONOSCOPY WITH PROPOFOL;  Surgeon: AJonathon Bellows MD;  [facility];  Service: Gastroenterology;  Laterality: N/A;   CORONARY ANGIOGRAPHY N/A 09/14/2017   Procedure: CORONARY ANGIOGRAPHY;  Surgeon: FTeodoro Spray MD;  Location: [place] [facility];  Service: Cardiovascular;  Laterality: N/A;   CORONARY ANGIOPLASTY     CORONARY STENT INTERVENTION N/A 05/15/2020   Procedure: coronary angiography;  Surgeon: Teodoro Spray, MD;  Location: [place] [facility];  Service: Cardiovascular;  Laterality: N/A;   CORONARY STENT INTERVENTION N/A 05/15/2020   Procedure: CORONARY STENT INTERVENTION;  Surgeon: Isaias Cowman, MD;  Location: [place] [facility];  Service: Cardiovascular;  Laterality: N/A;   CORONARY STENT PLACEMENT     ENDARTERECTOMY Left 10/01/2015   Procedure: ENDARTERECTOMY CAROTID;  Surgeon: Algernon Huxley, MD;  Location: [facility];  Service: Vascular;  Laterality: Left;   ENTEROSCOPY N/A 07/24/2020   Procedure: ENTEROSCOPY;  Surgeon: Virgel Manifold, MD;  Location: [facility];  Service: Endoscopy;  Laterality: N/A;   ESOPHAGOGASTRODUODENOSCOPY     ESOPHAGOGASTRODUODENOSCOPY (EGD) WITH PROPOFOL N/A 05/31/2016   Procedure: ESOPHAGOGASTRODUODENOSCOPY (EGD) WITH PROPOFOL;  Surgeon: Manya Silvas, MD;  Location: [facility]COPY;  Service: Endoscopy;  Laterality: N/A;   ESOPHAGOGASTRODUODENOSCOPY (EGD) WITH PROPOFOL N/A 07/07/2018   Procedure: ESOPHAGOGASTRODUODENOSCOPY (EGD) WITH PROPOFOL;  Surgeon: Manya Silvas, MD;  Location: [facility]COPY;  Service: Endoscopy;  Laterality: N/A;   ESOPHAGOGASTRODUODENOSCOPY (EGD) WITH PROPOFOL N/A 07/17/2018   Procedure: ESOPHAGOGASTRODUODENOSCOPY (EGD) WITH PROPOFOL;  Surgeon: Lucilla Lame, MD;  Location: [facility]COPY;  Service: Endoscopy;  Laterality: N/A;   ESOPHAGOGASTRODUODENOSCOPY (EGD) WITH PROPOFOL N/A 05/21/2020   Procedure: ESOPHAGOGASTRODUODENOSCOPY (EGD) WITH PROPOFOL;  Surgeon: Jonathon Bellows, MD;  Location: [facility] ENDOSCOPY;  Service: Gastroenterology;  Laterality: N/A;   ESOPHAGOGASTRODUODENOSCOPY (EGD) WITH PROPOFOL N/A 06/13/2020   Procedure: ESOPHAGOGASTRODUODENOSCOPY (EGD) WITH PROPOFOL;  Surgeon: Lucilla Lame, MD;  Location: [facility] ENDOSCOPY;  Service: Endoscopy;  Laterality: N/A;   EYE SURGERY     GIVENS CAPSULE STUDY N/A 04/17/2013   Procedure: GIVENS CAPSULE STUDY;  Surgeon: Arta Silence, MD;  Location: [facility] ENDOSCOPY;  Service: Endoscopy;  Laterality: N/A;  patient ate breakfast at [place] 06/11/2020   Procedure: GIVENS CAPSULE STUDY;  Surgeon: Virgel Manifold, MD;  Location: [facility] ENDOSCOPY;  Service: Endoscopy;  Laterality: N/A;   GIVENS CAPSULE STUDY N/A 01/29/2021   Procedure: GIVENS CAPSULE STUDY;  Surgeon: Virgel Manifold, MD;  Location: [facility] ENDOSCOPY;  Service: Endoscopy;  Laterality: N/A;   GIVENS CAPSULE STUDY N/A 08/24/2021   Procedure: GIVENS CAPSULE STUDY;  Surgeon: Virgel Manifold, MD;  Location: [facility] ENDOSCOPY;  Service:  Endoscopy;  Laterality: N/A;   HEMORRHOID BANDING  07/27/2016   Dr Nicholes Stairs   LEFT HEART CATH AND CORONARY ANGIOGRAPHY N/A 07/08/2022   Procedure: LEFT HEART CATH AND CORONARY ANGIOGRAPHY;  Surgeon: Dionisio David, MD;  Location: [place] [facility];  Service: Cardiovascular;  Laterality: N/A;   PARTIAL HYSTERECTOMY     RIGHT AND LEFT HEART CATH Bilateral 09/14/2017   Procedure: RIGHT AND LEFT HEART CATH;  Surgeon: Teodoro Spray, MD;  Location: [place] [facility];  Service: Cardiovascular;  Laterality: Bilateral;   RIGHT HEART CATH AND CORONARY ANGIOGRAPHY N/A 01/26/2023   Procedure: RIGHT HEART CATH AND CORONARY ANGIOGRAPHY;  Surgeon: Early Osmond, MD;  Location: [place] [facility];  Service: Cardiovascular;  Laterality: N/A;   RIGHT/LEFT HEART CATH AND CORONARY ANGIOGRAPHY Right 05/15/2020   Procedure: Right/Left Heart cath;  Surgeon: Teodoro Spray, MD;  Location: [place] [facility];  Service: Cardiovascular;  Laterality: Right;   TRIGGER FINGER RELEASE      Social History   Tobacco Use  Smoking  Status Former   Types: Cigarettes   Quit date: 02/15/1985   Years since quitting: 38.0  Smokeless Tobacco Never  Tobacco Comments   quit about 31 years ago    Social History   Substance and Sexual Activity  Alcohol Use No   Alcohol/week: 0.0 standard drinks of alcohol    Social History   Socioeconomic History   Marital status: Married    Spouse name: [name]   Number of children: 5   Years of education: Not on file   Highest education level: Not on file  Occupational History   Occupation: retired   Tobacco Use   Smoking status: Former    Types: Cigarettes    Quit date: 02/15/1985    Years since quitting: 38.0   Smokeless tobacco: Never   Tobacco comments:    quit about 31 years ago  Vaping Use   Vaping Use: Never used  Substance and Sexual Activity   Alcohol use: No    Alcohol/week: 0.0 standard drinks of alcohol   Drug use: No   Sexual activity: Not on file   Other Topics Concern   Not on file  Social History Narrative   Lives at home independently.   Organ donor   Had 5 children- son timothy   Son died in 79   Social Determinants of Health   Financial Resource Strain: Low Risk  (05/31/2022)   Overall Financial Resource Strain (CARDIA)    Difficulty of Paying Living Expenses: Not hard at all  Food Insecurity: No Food Insecurity (02/18/2022)   Hunger Vital Sign    Worried About Running Out of Food in the Last Year: Never true    Ran Out of Food in the Last Year: Never true  Transportation Needs: No Transportation Needs (05/31/2022)   PRAPARE - Hydrologist (Medical): No    Lack of Transportation (Non-Medical): No  Physical Activity: Not on file  Stress: No Stress Concern Present (02/18/2022)   [employer]    Feeling of Stress : Only a little  Social Connections: Socially Integrated (12/16/2021)   Social Connection and Isolation Panel [NHANES]    Frequency of Communication with Friends and Family: More than three times a week    Frequency of Social Gatherings with Friends and Family: More than three times a week    Attends Religious Services: More than 4 times per year    Active Member of [employer] or Organizations: Yes    Attends Music therapist: More than 4 times per year    Marital Status: Married  Human resources officer Violence: Not At Risk (01/18/2022)   Humiliation, Afraid, Rape, and Kick questionnaire    Fear of Current or Ex-Partner: No    Emotionally Abused: No    Physically Abused: No    Sexually Abused: No    Allergies  Allergen Reactions   Invokamet [Canagliflozin-Metformin Hcl] Other (See Comments)    Yeast Infection   Lovastatin Rash   Penicillins Rash    Has patient had a PCN reaction causing immediate rash, facial/tongue/throat swelling, SOB or lightheadedness with hypotension:Yes Has patient had a PCN reaction  causing severe rash involving mucus membranes or skin necrosis:all over body Has patient had a PCN reaction that required hospitalization: No Has patient had a PCN reaction occurring within the last 10 years: No If all of the above answers are "NO", then may proceed with Cephalosporin use.    Vicodin [Hydrocodone-Acetaminophen] Rash  Current Outpatient Medications  Medication Sig Dispense Refill   albuterol (VENTOLIN HFA) 108 (90 Base) MCG/ACT inhaler Inhale 2 puffs into the lungs every 4 (four) hours as needed for wheezing or shortness of breath.      amitriptyline (ELAVIL) 25 MG tablet Take 25 mg by mouth at bedtime as needed for sleep.      aspirin EC 81 MG EC tablet Take 1 tablet (81 mg total) by mouth daily. Swallow whole. 30 tablet 11   BD INSULIN SYRINGE U/F 31G X 5/16" 1 ML MISC      Dulaglutide 3 MG/0.5ML SOPN Inject 3 mg into the skin every Saturday.     Evolocumab (REPATHA SURECLICK) [id number] MG/ML SOAJ Inject 140 mg into the skin every 14 (fourteen) days. 2 mL 11   insulin NPH-regular Human (NOVOLIN 70/30) (70-30) 100 UNIT/ML injection Inject 30 Units into the skin with breakfast, with lunch, and with evening meal.     isosorbide mononitrate (IMDUR) 60 MG 24 hr tablet Take 60 mg by mouth 2 (two) times daily.     losartan (COZAAR) 25 MG tablet Take 25 mg by mouth daily.     metoprolol succinate (TOPROL-XL) 100 MG 24 hr tablet Take 100-200 mg by mouth See admin instructions. Take 200 mg by mouth in the morning and 100 mg in the evening     Multiple Vitamin (MULTI-VITAMIN) tablet Take 1 tablet by mouth daily.     ONETOUCH VERIO test strip      pantoprazole (PROTONIX) 40 MG tablet Take 40 mg by mouth daily.     sertraline (ZOLOFT) 25 MG tablet Take 1 tablet (25 mg total) by mouth daily. 30 tablet 0   SPIRIVA HANDIHALER 18 MCG inhalation capsule Place 18 mcg into inhaler and inhale at bedtime.     traMADol (ULTRAM) 50 MG tablet Take 50 mg by mouth every 6 (six) hours as needed for  moderate pain.     Vitamin A 2400 MCG (8000 UT) CAPS Take 8,000 Units by mouth daily.     No current facility-administered medications for this visit.   Facility-Administered Medications Ordered in Other Visits  Medication Dose Route Frequency Provider Last Rate Last Admin   heparin lock flush 100 unit/mL  500 Units Intravenous Once Verlon Au, NP       sodium chloride flush (NS) 0.9 % injection 10 mL  10 mL Intravenous Once Verlon Au, NP       sodium chloride flush (NS) 0.9 % injection 3 mL  3 mL Intravenous Q12H Scoggins, Amber, NP         Family History  Problem Relation Age of Onset   Uterine cancer Mother    Breast cancer Mother 57   Seizures Father    Stroke Father    Diabetes Father    COPD Father    Colon cancer Neg Hx        Physical Exam: Neuro: alert and no focal deficits other than left eye central blindness Teeth: dentures Card: RR with 4/6 sem Ext: no vericosities      Diagnostic Studies & Laboratory data: I have personally reviewed the following studies and agree with the findings   TTE (12/2022) IMPRESSIONS     1. Left ventricular ejection fraction, by estimation, is 60 to 65%. The  left ventricle has normal function. The left ventricle has no regional  wall motion abnormalities. There is moderate left ventricular hypertrophy  of the basal-septal segment. Left  ventricular diastolic parameters are consistent  with Grade I diastolic  dysfunction (impaired relaxation). Elevated left ventricular end-diastolic  pressure.   2. Right ventricular systolic function is normal. The right ventricular  size is normal. Tricuspid regurgitation signal is inadequate for assessing  PA pressure.   3. The mitral valve is normal in structure. Trivial mitral valve  regurgitation. No evidence of mitral stenosis.   4. The aortic valve is calcified. There is severe calcifcation of the  aortic valve. There is severe thickening of the aortic valve. Aortic valve   regurgitation is not visualized. Severe aortic valve stenosis. Aortic  valve area, by VTI measures 0.79 cm.  Aortic valve mean gradient measures 40.8 mmHg. Aortic valve Vmax measures  3.94 m/s.   5. The inferior vena cava is normal in size with greater than 50%  respiratory variability, suggesting right atrial pressure of 3 mmHg.   FINDINGS   Left Ventricle: Left ventricular ejection fraction, by estimation, is 60  to 65%. The left ventricle has normal function. The left ventricle has no  regional wall motion abnormalities. The left ventricular internal cavity  size was normal in size. There is   moderate left ventricular hypertrophy of the basal-septal segment. Left  ventricular diastolic parameters are consistent with Grade I diastolic  dysfunction (impaired relaxation). Elevated left ventricular end-diastolic  pressure.   Right Ventricle: The right ventricular size is normal. No increase in  right ventricular wall thickness. Right ventricular systolic function is  normal. Tricuspid regurgitation signal is inadequate for assessing PA  pressure.   Left Atrium: Left atrial size was normal in size.   Right Atrium: Right atrial size was normal in size.   Pericardium: There is no evidence of pericardial effusion.   Mitral Valve: The mitral valve is normal in structure. Mild to moderate  mitral annular calcification. Trivial mitral valve regurgitation. No  evidence of mitral valve stenosis.   Tricuspid Valve: The tricuspid valve is normal in structure. Tricuspid  valve regurgitation is trivial. No evidence of tricuspid stenosis.   Aortic Valve: The aortic valve is calcified. There is severe calcifcation  of the aortic valve. There is severe thickening of the aortic valve.  Aortic valve regurgitation is not visualized. Aortic regurgitation PHT  measures 283 msec. Severe aortic  stenosis is present. Aortic valve mean gradient measures 40.8 mmHg. Aortic  valve peak gradient  measures 62.2 mmHg. Aortic valve area, by VTI measures  0.79 cm.   Pulmonic Valve: The pulmonic valve was normal in structure. Pulmonic valve  regurgitation is not visualized. No evidence of pulmonic stenosis.   Aorta: The aortic root is normal in size and structure.   Venous: The inferior vena cava is normal in size with greater than 50%  respiratory variability, suggesting right atrial pressure of 3 mmHg.   IAS/Shunts: No atrial level shunt detected by color flow Doppler.     LEFT VENTRICLE  PLAX 2D  LVIDd:         4.30 cm   Diastology  LVIDs:         2.50 cm   LV e' medial:    5.28 cm/s  LV PW:         1.10 cm   LV E/e' medial:  21.5  LV IVS:        1.10 cm   LV e' lateral:   7.83 cm/s  LVOT diam:     2.10 cm   LV E/e' lateral: 14.5  LV SV:         82  LV SV Index:   42  LVOT Area:     3.46 cm     RIGHT VENTRICLE             IVC  RV Basal diam:  3.70 cm     IVC diam: 1.60 cm  RV S prime:     12.55 cm/s  TAPSE (M-mode): 2.4 cm   LEFT ATRIUM             Index        RIGHT ATRIUM          Index  LA diam:        4.60 cm 2.36 cm/m   RA Area:     9.69 cm  LA Vol (A2C):   67.5 ml 34.64 ml/m  RA Volume:   19.80 ml 10.16 ml/m  LA Vol (A4C):   54.0 ml 27.71 ml/m  LA Biplane Vol: 62.9 ml 32.28 ml/m   AORTIC VALVE  AV Area (Vmax):    0.86 cm  AV Area (Vmean):   0.76 cm  AV Area (VTI):     0.79 cm  AV Vmax:           394.25 cm/s  AV Vmean:          307.250 cm/s  AV VTI:            1.040 m  AV Peak Grad:      62.2 mmHg  AV Mean Grad:      40.8 mmHg  LVOT Vmax:         97.53 cm/s  LVOT Vmean:        67.833 cm/s  LVOT VTI:          0.237 m  LVOT/AV VTI ratio: 0.23  AI PHT:            283 msec    AORTA  Ao Root diam: 3.10 cm  Ao Asc diam:  3.70 cm   MITRAL VALVE  MV Area (PHT): 3.45 cm     SHUNTS  MV Decel Time: 220 msec     Systemic VTI:  0.24 m  MV E velocity: 113.50 cm/s  Systemic Diam: 2.10 cm  MV A velocity: 135.50 cm/s  MV E/A ratio:  0.84   CATH  (12/2022) Conclusion      Prox LAD lesion is 55% stenosed.   Prox RCA lesion is 20% stenosed.   1st Diag-1 lesion is 95% stenosed.   1st Diag-2 lesion is 90% stenosed.   1.  High-grade ostial and mid body diagonal disease with RFR positive proximal LAD disease (0.86). 2.  Patent proximal right coronary artery stent with minimal in-stent restenosis.   Recent Radiology Findings:   CT scan Non contrast (01/2023) FINDINGS: Cardiovascular: The heart size is normal. No substantial pericardial effusion. Coronary artery calcification is evident. Mild atherosclerotic calcification is noted in the wall of the thoracic aorta. Ascending thoracic aorta measures 3.3 cm diameter. Aortic valve calcification evident. Mitral annular calcification evident.   Mediastinum/Nodes: No mediastinal lymphadenopathy. No evidence for gross hilar lymphadenopathy although assessment is limited by the lack of intravenous contrast on the current study. The esophagus has normal imaging features. There is no axillary lymphadenopathy.   Lungs/Pleura: 4 mm right middle lobe nodule on 65/4 is stable. No followup imaging is recommended. No new suspicious pulmonary nodule or mass. Scarring noted inferior left lower lobe in a region adjacent to multiple nonacute left rib fractures. A 2 mm peripheral left upper lobe  nodule on 32/4 is unchanged as is a 2-3 mm left lower lobe nodule on 73/4. No followup imaging is recommended. No focal airspace consolidation. There is no evidence of pleural effusion.   Upper Abdomen: Visualized portion of the upper abdomen is unremarkable.   Musculoskeletal: No worrisome lytic or sclerotic osseous abnormality.   IMPRESSION: 1. No acute findings in the chest. 2. Aortic atherosclerosis with evidence of aortic valve calcification and mitral annular calcification. 3. Stable tiny bilateral pulmonary nodules, consistent with benign etiology. No followup imaging is recommended. 4.  Multiple nonacute left rib fractures with adjacent scarring in the left lower lobe. 5.  Aortic Atherosclerosis (ICD10-I70.0).    Recent Lab Findings: Lab Results  Component Value Date   WBC 6.7 02/02/2023   HGB 9.8 (L) 02/02/2023   HCT 30.9 (L) 02/02/2023   PLT 288 02/02/2023   GLUCOSE 169 (H) 01/21/2023   CHOL 130 05/21/2020   TRIG 81 05/21/2020   HDL 35 (L) 05/21/2020   LDLCALC 79 05/21/2020   ALT 14 07/21/2020   AST 18 07/21/2020   NA 143 01/26/2023   K 4.0 01/26/2023   CL 101 01/21/2023   CREATININE 0.88 01/21/2023   BUN 15 01/21/2023   CO2 25 01/21/2023   INR 1.0 06/12/2020   HGBA1C 7.3 (H) 05/21/2020      Assessment / Plan:     77 yo female with NYHA class 3 symptoms of severe AS and CAD. Pt at slightly higher risk for surgery secondary to her PVD and previous retinal occlusion and her iron issues/anemia but I feel should do well with SAVR/CABG. She has currently a difficult situation with her demented husband at home and is stressed over managing him and caring for herself. Will need resources for this following her surgery Pt understands the risks of surgery and wishes to proceed next Tuesday.   I have spent 65 min in review of the records, viewing studies and in face to face with patient and in coordination of future care    [name] 02/21/2023 9:00 AM

## 2023-02-22 ENCOUNTER — Ambulatory Visit (INDEPENDENT_AMBULATORY_CARE_PROVIDER_SITE_OTHER): Payer: Medicare HMO

## 2023-02-22 ENCOUNTER — Encounter (INDEPENDENT_AMBULATORY_CARE_PROVIDER_SITE_OTHER): Payer: Self-pay | Admitting: Vascular Surgery

## 2023-02-22 ENCOUNTER — Encounter: Payer: Self-pay | Admitting: Oncology

## 2023-02-22 ENCOUNTER — Ambulatory Visit (INDEPENDENT_AMBULATORY_CARE_PROVIDER_SITE_OTHER): Payer: Medicare HMO | Admitting: Vascular Surgery

## 2023-02-22 VITALS — BP 170/73 | HR 67 | Resp 16 | Wt 193.6 lb

## 2023-02-22 DIAGNOSIS — E1165 Type 2 diabetes mellitus with hyperglycemia: Secondary | ICD-10-CM | POA: Diagnosis not present

## 2023-02-22 DIAGNOSIS — I6523 Occlusion and stenosis of bilateral carotid arteries: Secondary | ICD-10-CM

## 2023-02-22 DIAGNOSIS — I739 Peripheral vascular disease, unspecified: Secondary | ICD-10-CM | POA: Diagnosis not present

## 2023-02-22 DIAGNOSIS — E782 Mixed hyperlipidemia: Secondary | ICD-10-CM

## 2023-02-22 DIAGNOSIS — I10 Essential (primary) hypertension: Secondary | ICD-10-CM | POA: Diagnosis not present

## 2023-02-22 NOTE — Assessment & Plan Note (Signed)
Carotid duplex reveals 1 to 39% right ICA stenosis and a widely patent left carotid endarterectomy without recurrent stenosis.  Continue current medical regimen.  Recheck in 1 year with carotid duplex.  She would be low risk for cerebrovascular complications with her open heart surgery, although she is aware that there are risks even without significant carotid disease

## 2023-02-22 NOTE — Progress Notes (Signed)
MRN : [id number]  Joyce Potter is a 77 y.o. (Feb 27, 1946) female who presents with chief complaint of  Chief Complaint  Patient presents with   Follow-up    Ultrasound follow up  .  History of Present Illness: Patient returns in follow-up of multiple vascular issues.  She has upcoming open heart surgery with what sounds like a valve replacement and coronary bypass required simultaneously.  She has not had any focal neurologic symptoms. Specifically, the patient denies amaurosis fugax, speech or swallowing difficulties, or arm or leg weakness or numbness.  Carotid duplex reveals 1 to 39% right ICA stenosis and a widely patent left carotid endarterectomy without recurrent stenosis. The patient also has significant pain and neuropathy type symptoms of the lower extremities.  She has multiple atherosclerotic issues and ABIs were done today. ABIs today are 1.05 on the right and 1.08 on the left with triphasic waveforms and normal digital pressures consistent with no arterial insufficiency.   Current Outpatient Medications  Medication Sig Dispense Refill   albuterol (VENTOLIN HFA) 108 (90 Base) MCG/ACT inhaler Inhale 2 puffs into the lungs every 4 (four) hours as needed for wheezing or shortness of breath.      amitriptyline (ELAVIL) 25 MG tablet Take 25 mg by mouth at bedtime as needed for sleep.      aspirin EC 81 MG EC tablet Take 1 tablet (81 mg total) by mouth daily. Swallow whole. 30 tablet 11   BD INSULIN SYRINGE U/F 31G X 5/16" 1 ML MISC      Dulaglutide 3 MG/0.5ML SOPN Inject 3 mg into the skin every Saturday.     Evolocumab (REPATHA SURECLICK) [id number] MG/ML SOAJ Inject 140 mg into the skin every 14 (fourteen) days. 2 mL 11   insulin NPH-regular Human (NOVOLIN 70/30) (70-30) 100 UNIT/ML injection Inject 30 Units into the skin with breakfast, with lunch, and with evening meal.     isosorbide mononitrate (IMDUR) 60 MG 24 hr tablet Take 60 mg by mouth 2 (two) times daily.     losartan  (COZAAR) 25 MG tablet Take 25 mg by mouth daily.     metoprolol succinate (TOPROL-XL) 100 MG 24 hr tablet Take 100-200 mg by mouth See admin instructions. Take 200 mg by mouth in the morning and 100 mg in the evening     Multiple Vitamin (MULTI-VITAMIN) tablet Take 1 tablet by mouth daily.     ONETOUCH VERIO test strip      pantoprazole (PROTONIX) 40 MG tablet Take 40 mg by mouth daily.     sertraline (ZOLOFT) 25 MG tablet Take 1 tablet (25 mg total) by mouth daily. 30 tablet 0   SPIRIVA HANDIHALER 18 MCG inhalation capsule Place 18 mcg into inhaler and inhale at bedtime.     traMADol (ULTRAM) 50 MG tablet Take 50 mg by mouth every 6 (six) hours as needed for moderate pain.     Vitamin A 2400 MCG (8000 UT) CAPS Take 8,000 Units by mouth daily.     No current facility-administered medications for this visit.   Facility-Administered Medications Ordered in Other Visits  Medication Dose Route Frequency Provider Last Rate Last Admin   heparin lock flush 100 unit/mL  500 Units Intravenous Once Verlon Au, NP       sodium chloride flush (NS) 0.9 % injection 10 mL  10 mL Intravenous Once Verlon Au, NP       sodium chloride flush (NS) 0.9 % injection 3 mL  3  mL Intravenous Q12H Scoggins, Amber, NP        Past Medical History:  Diagnosis Date   Anemia    iron deficiency   Aortic stenosis    Basal cell carcinoma    CAD (coronary artery disease)    s/p Left circumflex stent in 2012   Carotid stenosis    Cataract    High cholesterol    HTN (hypertension)    Hyperlipidemia    IDDM (insulin dependent diabetes mellitus)    Multiple thyroid nodules    Benign   Peptic ulcer disease    Retinal artery occlusion    on left    Past Surgical History:  Procedure Laterality Date   ABDOMINAL HYSTERECTOMY     ARTERY BIOPSY Left 11/19/2015   Procedure: BIOPSY TEMPORAL ARTERY;  Surgeon: Algernon Huxley, MD;  Location: [facility];  Service: Vascular;  Laterality: Left;   BREAST BIOPSY Left 2002    core- neg   BREAST EXCISIONAL BIOPSY     ??? not visible scar on neither breast- pt believes it to be right breast    CARDIAC CATHETERIZATION N/A 08/08/2015   Procedure: Right and Left Heart Cath and Coronary Angiography;  Surgeon: Teodoro Spray, MD;  Location: [place] [facility];  Service: Cardiovascular;  Laterality: N/A;   CARDIAC CATHETERIZATION Bilateral 09/03/2016   Procedure: Right/Left Heart Cath and Coronary Angiography;  Surgeon: Teodoro Spray, MD;  Location: [place] [facility];  Service: Cardiovascular;  Laterality: Bilateral;   CATARACT EXTRACTION W/ INTRAOCULAR LENS  IMPLANT, BILATERAL     COLONOSCOPY     in 2013- internal hemorrhoids   COLONOSCOPY WITH PROPOFOL N/A 07/07/2018   Procedure: COLONOSCOPY WITH PROPOFOL;  Surgeon: Manya Silvas, MD;  Location: [facility] ENDOSCOPY;  Service: Endoscopy;  Laterality: N/A;   COLONOSCOPY WITH PROPOFOL N/A 05/22/2020   Procedure: COLONOSCOPY WITH PROPOFOL;  Surgeon: Jonathon Bellows, MD;  Location: [facility] ENDOSCOPY;  Service: Gastroenterology;  Laterality: N/A;   CORONARY ANGIOGRAPHY N/A 09/14/2017   Procedure: CORONARY ANGIOGRAPHY;  Surgeon: Teodoro Spray, MD;  Location: [place] [facility];  Service: Cardiovascular;  Laterality: N/A;   CORONARY ANGIOPLASTY     CORONARY STENT INTERVENTION N/A 05/15/2020   Procedure: coronary angiography;  Surgeon: Teodoro Spray, MD;  Location: [place] [facility];  Service: Cardiovascular;  Laterality: N/A;   CORONARY STENT INTERVENTION N/A 05/15/2020   Procedure: CORONARY STENT INTERVENTION;  Surgeon: Isaias Cowman, MD;  Location: [place] [facility];  Service: Cardiovascular;  Laterality: N/A;   CORONARY STENT PLACEMENT     ENDARTERECTOMY Left 10/01/2015   Procedure: ENDARTERECTOMY CAROTID;  Surgeon: Algernon Huxley, MD;  Location: [facility];  Service: Vascular;  Laterality: Left;   ENTEROSCOPY N/A 07/24/2020   Procedure: ENTEROSCOPY;  Surgeon: Virgel Manifold, MD;  Location: [facility] ENDOSCOPY;   Service: Endoscopy;  Laterality: N/A;   ESOPHAGOGASTRODUODENOSCOPY     ESOPHAGOGASTRODUODENOSCOPY (EGD) WITH PROPOFOL N/A 05/31/2016   Procedure: ESOPHAGOGASTRODUODENOSCOPY (EGD) WITH PROPOFOL;  Surgeon: Manya Silvas, MD;  Location: [facility] ENDOSCOPY;  Service: Endoscopy;  Laterality: N/A;   ESOPHAGOGASTRODUODENOSCOPY (EGD) WITH PROPOFOL N/A 07/07/2018   Procedure: ESOPHAGOGASTRODUODENOSCOPY (EGD) WITH PROPOFOL;  Surgeon: Manya Silvas, MD;  Location: [facility] ENDOSCOPY;  Service: Endoscopy;  Laterality: N/A;   ESOPHAGOGASTRODUODENOSCOPY (EGD) WITH PROPOFOL N/A 07/17/2018   Procedure: ESOPHAGOGASTRODUODENOSCOPY (EGD) WITH PROPOFOL;  Surgeon: Lucilla Lame, MD;  Location: [facility] ENDOSCOPY;  Service: Endoscopy;  Laterality: N/A;   ESOPHAGOGASTRODUODENOSCOPY (EGD) WITH PROPOFOL N/A 05/21/2020   Procedure: ESOPHAGOGASTRODUODENOSCOPY (EGD)  WITH PROPOFOL;  Surgeon: Jonathon Bellows, MD;  Location: [facility] ENDOSCOPY;  Service: Gastroenterology;  Laterality: N/A;   ESOPHAGOGASTRODUODENOSCOPY (EGD) WITH PROPOFOL N/A 06/13/2020   Procedure: ESOPHAGOGASTRODUODENOSCOPY (EGD) WITH PROPOFOL;  Surgeon: Lucilla Lame, MD;  Location: [facility] ENDOSCOPY;  Service: Endoscopy;  Laterality: N/A;   EYE SURGERY     GIVENS CAPSULE STUDY N/A 04/17/2013   Procedure: GIVENS CAPSULE STUDY;  Surgeon: Arta Silence, MD;  Location: [facility] ENDOSCOPY;  Service: Endoscopy;  Laterality: N/A;  patient ate breakfast at [place] 06/11/2020   Procedure: GIVENS CAPSULE STUDY;  Surgeon: Virgel Manifold, MD;  Location: [facility] ENDOSCOPY;  Service: Endoscopy;  Laterality: N/A;   GIVENS CAPSULE STUDY N/A 01/29/2021   Procedure: GIVENS CAPSULE STUDY;  Surgeon: Virgel Manifold, MD;  Location: [facility] ENDOSCOPY;  Service: Endoscopy;  Laterality: N/A;   GIVENS CAPSULE STUDY N/A 08/24/2021   Procedure: GIVENS CAPSULE STUDY;  Surgeon: Virgel Manifold, MD;  Location: [facility] ENDOSCOPY;  Service: Endoscopy;  Laterality: N/A;   HEMORRHOID BANDING   07/27/2016   Dr Nicholes Stairs   LEFT HEART CATH AND CORONARY ANGIOGRAPHY N/A 07/08/2022   Procedure: LEFT HEART CATH AND CORONARY ANGIOGRAPHY;  Surgeon: Dionisio David, MD;  Location: [place] [facility];  Service: Cardiovascular;  Laterality: N/A;   PARTIAL HYSTERECTOMY     RIGHT AND LEFT HEART CATH Bilateral 09/14/2017   Procedure: RIGHT AND LEFT HEART CATH;  Surgeon: Teodoro Spray, MD;  Location: [place] [facility];  Service: Cardiovascular;  Laterality: Bilateral;   RIGHT HEART CATH AND CORONARY ANGIOGRAPHY N/A 01/26/2023   Procedure: RIGHT HEART CATH AND CORONARY ANGIOGRAPHY;  Surgeon: Early Osmond, MD;  Location: [place] [facility];  Service: Cardiovascular;  Laterality: N/A;   RIGHT/LEFT HEART CATH AND CORONARY ANGIOGRAPHY Right 05/15/2020   Procedure: Right/Left Heart cath;  Surgeon: Teodoro Spray, MD;  Location: [place] [facility];  Service: Cardiovascular;  Laterality: Right;   TRIGGER FINGER RELEASE       Social History   Tobacco Use   Smoking status: Former    Types: Cigarettes    Quit date: 02/15/1985    Years since quitting: 38.0   Smokeless tobacco: Never   Tobacco comments:    quit about 31 years ago  Vaping Use   Vaping Use: Never used  Substance Use Topics   Alcohol use: No    Alcohol/week: 0.0 standard drinks of alcohol   Drug use: No      Family History  Problem Relation Age of Onset   Uterine cancer Mother    Breast cancer Mother 13   Seizures Father    Stroke Father    Diabetes Father    COPD Father    Colon cancer Neg Hx     Allergies  Allergen Reactions   Invokamet [Canagliflozin-Metformin Hcl] Other (See Comments)    Yeast Infection   Lovastatin Rash   Penicillins Rash    Has patient had a PCN reaction causing immediate rash, facial/tongue/throat swelling, SOB or lightheadedness with hypotension:Yes Has patient had a PCN reaction causing severe rash involving mucus membranes or skin necrosis:all over body Has patient had a PCN  reaction that required hospitalization: No Has patient had a PCN reaction occurring within the last 10 years: No If all of the above answers are "NO", then may proceed with Cephalosporin use.    Vicodin [Hydrocodone-Acetaminophen] Rash     REVIEW OF SYSTEMS (Negative unless checked)  Constitutional: '[]'$ Weight loss  '[]'$   Fever  '[]'$ Chills Cardiac: '[]'$ Chest pain   '[]'$ Chest pressure   '[]'$ Palpitations   '[]'$ Shortness of breath when laying flat   '[]'$ Shortness of breath at rest   '[]'$ Shortness of breath with exertion. Vascular:  '[]'$ Pain in legs with walking   '[]'$ Pain in legs at rest   '[]'$ Pain in legs when laying flat   '[]'$ Claudication   '[]'$ Pain in feet when walking  '[]'$ Pain in feet at rest  '[]'$ Pain in feet when laying flat   '[]'$ History of DVT   '[]'$ Phlebitis   '[]'$ Swelling in legs   '[]'$ Varicose veins   '[]'$ Non-healing ulcers Pulmonary:   '[]'$ Uses home oxygen   '[]'$ Productive cough   '[]'$ Hemoptysis   '[]'$ Wheeze  '[]'$ COPD   '[]'$ Asthma Neurologic:  '[]'$ Dizziness  '[]'$ Blackouts   '[]'$ Seizures   '[]'$ History of stroke   '[]'$ History of TIA  '[]'$ Aphasia   '[]'$ Temporary blindness   '[]'$ Dysphagia   '[]'$ Weakness or numbness in arms   '[x]'$ Weakness or numbness in legs Musculoskeletal:  '[x]'$ Arthritis   '[]'$ Joint swelling   '[x]'$ Joint pain   '[]'$ Low back pain Hematologic:  '[]'$ Easy bruising  '[]'$ Easy bleeding   '[]'$ Hypercoagulable state   '[]'$ Anemic  '[]'$ Hepatitis Gastrointestinal:  '[]'$ Blood in stool   '[]'$ Vomiting blood  '[]'$ Gastroesophageal reflux/heartburn   '[]'$ Difficulty swallowing. Genitourinary:  '[]'$ Chronic kidney disease   '[]'$ Difficult urination  '[]'$ Frequent urination  '[]'$ Burning with urination   '[]'$ Blood in urine Skin:  '[]'$ Rashes   '[]'$ Ulcers   '[]'$ Wounds Psychological:  '[]'$ History of anxiety   '[]'$  History of major depression.  Physical Examination  Vitals:   02/22/23 1404  BP: (!) 170/73  Pulse: 67  Resp: 16  Weight: 193 lb 9.6 oz (87.8 kg)   Body mass index is 34.29 kg/m. Gen:  WD/WN, NAD Head: [place]/AT, No temporalis wasting. Ear/Nose/Throat: Hearing grossly intact, nares w/o erythema or  drainage, trachea midline Eyes: Conjunctiva clear. Sclera non-icteric Neck: Supple.  No JVD Pulmonary:  Good air movement, equal and clear to auscultation bilaterally.  Cardiac: RRR, No JVD Vascular:  Vessel Right Left  Radial Palpable Palpable                          PT Palpable Palpable  DP Palpable Palpable   Gastrointestinal: soft, non-tender/non-distended. No guarding/reflex.  Musculoskeletal: M/S 5/5 throughout.  No deformity or atrophy. Trace LE edema. Neurologic: CN 2-12 intact. Sensation grossly intact in extremities.  Symmetrical.  Speech is fluent. Motor exam as listed above. Psychiatric: Judgment intact, Mood & affect appropriate for pt's clinical situation. Dermatologic: No rashes or ulcers noted.  No cellulitis or open wounds.    CBC Lab Results  Component Value Date   WBC 6.7 02/02/2023   HGB 9.8 (L) 02/02/2023   HCT 30.9 (L) 02/02/2023   MCV 86.8 02/02/2023   PLT 288 02/02/2023    BMET    Component Value Date/Time   NA 143 01/26/2023 1103   NA 142 01/21/2023 1213   NA 143 05/04/2013 0614   K 4.0 01/26/2023 1103   K 3.8 05/04/2013 0614   CL 101 01/21/2023 1213   CL 110 (H) 05/04/2013 0614   CO2 25 01/21/2023 1213   CO2 27 05/04/2013 0614   GLUCOSE 169 (H) 01/21/2023 1213   GLUCOSE 102 (H) 07/24/2020 0417   GLUCOSE 130 (H) 05/04/2013 0614   BUN 15 01/21/2023 1213   BUN 12 05/04/2013 0614   CREATININE 0.88 01/21/2023 1213   CREATININE 0.79 08/27/2014 1445   CALCIUM 9.1 01/21/2023 1213   CALCIUM 8.2 (L) 05/04/2013 [id number]  GFRNONAA >60 07/24/2020 0417   GFRNONAA >60 08/27/2014 1445   GFRAA >60 07/24/2020 0417   GFRAA >60 08/27/2014 1445   CrCl cannot be calculated (Patient's most recent lab result is older than the maximum 21 days allowed.).  COAG Lab Results  Component Value Date   INR 1.0 06/12/2020   INR 1.0 05/20/2020   INR 1.0 07/30/2019    Radiology CT Chest Wo Contrast  Result Date: 02/17/2023 CLINICAL DATA:  Thoracic aortic  disease. Pretreatment planning for AVR. EXAM: CT CHEST WITHOUT CONTRAST TECHNIQUE: Multidetector CT imaging of the chest was performed following the standard protocol without IV contrast. RADIATION DOSE REDUCTION: This exam was performed according to the departmental dose-optimization program which includes automated exposure control, adjustment of the mA and/or kV according to patient size and/or use of iterative reconstruction technique. COMPARISON:  07/26/2019 FINDINGS: Cardiovascular: The heart size is normal. No substantial pericardial effusion. Coronary artery calcification is evident. Mild atherosclerotic calcification is noted in the wall of the thoracic aorta. Ascending thoracic aorta measures 3.3 cm diameter. Aortic valve calcification evident. Mitral annular calcification evident. Mediastinum/Nodes: No mediastinal lymphadenopathy. No evidence for gross hilar lymphadenopathy although assessment is limited by the lack of intravenous contrast on the current study. The esophagus has normal imaging features. There is no axillary lymphadenopathy. Lungs/Pleura: 4 mm right middle lobe nodule on 65/4 is stable. No followup imaging is recommended. No new suspicious pulmonary nodule or mass. Scarring noted inferior left lower lobe in a region adjacent to multiple nonacute left rib fractures. A 2 mm peripheral left upper lobe nodule on 32/4 is unchanged as is a 2-3 mm left lower lobe nodule on 73/4. No followup imaging is recommended. No focal airspace consolidation. There is no evidence of pleural effusion. Upper Abdomen: Visualized portion of the upper abdomen is unremarkable. Musculoskeletal: No worrisome lytic or sclerotic osseous abnormality. IMPRESSION: 1. No acute findings in the chest. 2. Aortic atherosclerosis with evidence of aortic valve calcification and mitral annular calcification. 3. Stable tiny bilateral pulmonary nodules, consistent with benign etiology. No followup imaging is recommended. 4.  Multiple nonacute left rib fractures with adjacent scarring in the left lower lobe. 5.  Aortic Atherosclerosis (ICD10-I70.0). Electronically Signed   By: Misty Stanley M.D.   On: 02/17/2023 10:29   [place] UPPER EXTREMITY ARTERIAL RIGHT LIMITED (GRAFT, SINGLE VESSEL)  Result Date: 02/02/2023 CLINICAL DATA:  Swelling and erythema in the region of right radial artery access for cardiac catheterization performed on 01/26/2023 EXAM: RIGHT UPPER EXTREMITY ARTERIAL DUPLEX SCAN TECHNIQUE: Gray-scale sonography as well as color Doppler and duplex ultrasound was performed to evaluate the arteries of the upper extremity in the region of interest. COMPARISON:  None Available. FINDINGS: Radial artery is normally patent in the right forearm with no evidence of thrombosis, dissection or pseudoaneurysm. No AV fistula demonstrated. In the region of swelling and erythema, a region of irregular complex fluid in the subcutaneous soft tissues just superficial to the radial artery measures 2.5 x 0.5 x 1.0 cm and likely representing resolving hemorrhage. IMPRESSION: 1. No evidence of radial artery thrombosis, dissection or pseudoaneurysm after recent access for cardiac catheterization. 2. Complex fluid collection in the subcutaneous soft tissues just superficial to the radial artery measuring 2.5 x 0.5 x 1.0 cm likely represents resolving hemorrhage. Electronically Signed   By: Aletta Edouard M.D.   On: 02/02/2023 16:48   CARDIAC CATHETERIZATION  Result Date: 01/26/2023   Prox LAD lesion is 55% stenosed.   Prox RCA lesion is 20% stenosed.  1st Diag-1 lesion is 95% stenosed.   1st Diag-2 lesion is 90% stenosed. 1.  High-grade ostial and mid body diagonal disease with RFR positive proximal LAD disease (0.86). 2.  Patent proximal right coronary artery stent with minimal in-stent restenosis. Recommendation: Will conduct multidisciplinary review regarding surgical aortic valve replacement with CABG versus TAVR and bifurcation stenting.      Assessment/Plan HLD (hyperlipidemia) lipid control important in reducing the progression of atherosclerotic disease. Continue Repatha therapy   Carotid stenosis Carotid duplex reveals 1 to 39% right ICA stenosis and a widely patent left carotid endarterectomy without recurrent stenosis.  Continue current medical regimen.  Recheck in 1 year with carotid duplex.  She would be low risk for cerebrovascular complications with her open heart surgery, although she is aware that there are risks even without significant carotid disease  PVD (peripheral vascular disease) ([place]) ABIs today are 1.05 on the right and 1.08 on the left with triphasic waveforms and normal digital pressures consistent with no arterial insufficiency.  Her lower extremity symptoms are likely predominantly from neuropathy and or not from arterial insufficiency.  Uncontrolled type 2 diabetes mellitus with hyperglycemia (HCC) blood glucose control important in reducing the progression of atherosclerotic disease. Also, involved in wound healing. On appropriate medications.     HTN (hypertension) blood pressure control important in reducing the progression of atherosclerotic disease. On appropriate oral medications.  Leotis Pain, MD  02/22/2023 2:57 PM    This note was created with Dragon medical transcription system.  Any errors from dictation are purely unintentional

## 2023-02-22 NOTE — Assessment & Plan Note (Signed)
ABIs today are 1.05 on the right and 1.08 on the left with triphasic waveforms and normal digital pressures consistent with no arterial insufficiency.  Her lower extremity symptoms are likely predominantly from neuropathy and or not from arterial insufficiency.

## 2023-02-22 NOTE — Assessment & Plan Note (Signed)
lipid control important in reducing the progression of atherosclerotic disease. Continue Repatha therapy

## 2023-02-23 LAB — VAS US ABI WITH/WO TBI
Left ABI: 1.08
Right ABI: 1.05

## 2023-02-24 NOTE — Progress Notes (Addendum)
Surgical Instructions    Your procedure is scheduled on 03/02/2023.  Report to [facility] Main Entrance "A" at 6:30 A.M., then check in with the Admitting office.  Call this number if you have problems the morning of surgery:  [phone]   If you have any questions prior to your surgery date call [phone]: Open Monday-Friday 8am-4pm If you experience any cold or flu symptoms such as cough, fever, chills, shortness of breath, etc. between now and your scheduled surgery, please notify us at the above number     Remember:  Do not eat or drink after midnight the night before your surgery   Take these medicines the morning of surgery with A SIP OF WATER:  metoprolol succinate (TOPROL-XL)  pantoprazole (PROTONIX)  sertraline (ZOLOFT)  If needed:  albuterol (VENTOLIN HFA)  traMADol (ULTRAM)   WHAT DO I DO ABOUT MY DIABETES MEDICATION?   Do not take oral diabetes medicines (pills) the morning of surgery.  THE NIGHT BEFORE SURGERY, take 21 units of Novolin 70/30 insulin.       THE MORNING OF SURGERY, take 15 units of Novolin 70/30 insulin.  The day of surgery, do not take other diabetes injectables, including Byetta (exenatide), Bydureon (exenatide ER), Victoza (liraglutide), or Trulicity (dulaglutide).  If your CBG is greater than 220 mg/dL, you may take  of your sliding scale (correction) dose of insulin.   Please stop taking Dulaglutide (Trulicity) 7 days prior to surgery. Last dose on 02/19/2023.  HOW TO MANAGE YOUR DIABETES BEFORE AND AFTER SURGERY  Why is it important to control my blood sugar before and after surgery? Improving blood sugar levels before and after surgery helps healing and can limit problems. A way of improving blood sugar control is eating a healthy diet by:  Eating less sugar and carbohydrates  Increasing activity/exercise  Talking with your doctor about reaching your blood sugar goals High blood sugars (greater than 180 mg/dL) can raise your  risk of infections and slow your recovery, so you will need to focus on controlling your diabetes during the weeks before surgery. Make sure that the doctor who takes care of your diabetes knows about your planned surgery including the date and location.  How do I manage my blood sugar before surgery? Check your blood sugar at least 4 times a day, starting 2 days before surgery, to make sure that the level is not too high or low.  Check your blood sugar the morning of your surgery when you wake up and every 2 hours until you get to the Short Stay unit.  If your blood sugar is less than 70 mg/dL, you will need to treat for low blood sugar: Do not take insulin. Treat a low blood sugar (less than 70 mg/dL) with  cup of clear juice (cranberry or apple), 4 glucose tablets, OR glucose gel. Recheck blood sugar in 15 minutes after treatment (to make sure it is greater than 70 mg/dL). If your blood sugar is not greater than 70 mg/dL on recheck, call [phone] for further instructions. Report your blood sugar to the short stay nurse when you get to Short Stay.  If you are admitted to the hospital after surgery: Your blood sugar will be checked by the staff and you will probably be given insulin after surgery (instead of oral diabetes medicines) to make sure you have good blood sugar levels. The goal for blood sugar control after surgery is 80-180 mg/dL. to  As of today, STOP taking any Aspirin (unless  otherwise instructed by your surgeon) Aleve, Naproxen, Ibuprofen, Motrin, Advil, Goody's, BC's, all herbal medications, fish oil, and all vitamins.  Special instructions:    Oral Hygiene is also important to reduce your risk of infection.  Remember - BRUSH YOUR TEETH THE MORNING OF SURGERY WITH YOUR REGULAR TOOTHPASTE  [place]- Preparing For Surgery Before surgery, you can play an important role. Because skin is not sterile, your skin needs to be as free of germs as possible. You can reduce the  number of germs on your skin by washing with CHG (chlorahexidine gluconate) Soap before surgery.  CHG is an antiseptic cleaner which kills germs and bonds with the skin to continue killing germs even after washing.     Please do not use if you have an allergy to CHG or antibacterial soaps. If your skin becomes reddened/irritated stop using the CHG.  Do not shave (including legs and underarms) for at least 48 hours prior to first CHG shower. It is OK to shave your face.  Please follow these instructions carefully.     Shower the NIGHT BEFORE SURGERY and the MORNING OF SURGERY with CHG Soap.   If you chose to wash your hair, wash your hair first as usual with your normal shampoo. After you shampoo, rinse your hair and body thoroughly to remove the shampoo.  Then [employer] and genitals (private parts) with your normal soap and rinse thoroughly to remove soap.  After that Use CHG Soap as you would any other liquid soap. You can apply CHG directly to the skin and wash gently with a scrungie or a clean washcloth.   Apply the CHG Soap to your body ONLY FROM THE NECK DOWN.  Do not use on open wounds or open sores. Avoid contact with your eyes, ears, mouth and genitals (private parts). Wash Face and genitals (private parts)  with your normal soap.   Wash thoroughly, paying special attention to the area where your surgery will be performed.  Thoroughly rinse your body with warm water from the neck down.  DO NOT shower/wash with your normal soap after using and rinsing off the CHG Soap.  Pat yourself dry with a CLEAN TOWEL.  Wear CLEAN PAJAMAS to bed the night before surgery  Place CLEAN SHEETS on your bed the night before your surgery  DO NOT SLEEP WITH PETS.   Day of Surgery:  Take a shower with CHG soap. Wear Clean/Comfortable clothing the morning of surgery Do not apply any deodorants/lotions.   Remember to brush your teeth WITH YOUR REGULAR TOOTHPASTE.  Do not wear jewelry or  makeup. Do not wear lotions, powders, perfumes/cologne or deodorant. Do not shave 48 hours prior to surgery.  Men may shave face and neck. Do not bring valuables to the hospital. Do not wear nail polish, gel polish, artificial nails, or any other type of covering on natural nails (fingers and toes) If you have artificial nails or gel coating that need to be removed by a nail salon, please have this removed prior to surgery. Artificial nails or gel coating may interfere with anesthesia's ability to adequately monitor your vital signs.  [facility] is not responsible for any belongings or valuables.    Do NOT Smoke (Tobacco/Vaping)  24 hours prior to your procedure  If you use a CPAP at night, you may bring your mask for your overnight stay.   Contacts, glasses, hearing aids, dentures or partials may not be worn into surgery, please bring cases for these  belongings   For patients admitted to the hospital, discharge time will be determined by your treatment team.   Patients discharged the day of surgery will not be allowed to drive home, and someone needs to stay with them for 24 hours.   SURGICAL WAITING ROOM VISITATION Patients having surgery or a procedure may have no more than 2 support people in the waiting area - these visitors may rotate.   Children under the age of 23 must have an adult with them who is not the patient. If the patient needs to stay at the hospital during part of their recovery, the visitor guidelines for inpatient rooms apply. Pre-op nurse will coordinate an appropriate time for 1 support person to accompany patient in pre-op.  This support person may not rotate.   Please refer to [email or web address] for the visitor guidelines for Inpatients (after your surgery is over and you are in a regular room).   If you received a COVID test during your pre-op visit, it is requested that you wear a mask when out in public, stay  away from anyone that may not be feeling well, and notify your surgeon if you develop symptoms. If you have been in contact with anyone that has tested positive in the last 10 days, please notify your surgeon.    Please read over the following fact sheets that you were given.

## 2023-02-25 ENCOUNTER — Other Ambulatory Visit: Payer: Self-pay

## 2023-02-25 ENCOUNTER — Encounter (HOSPITAL_COMMUNITY): Payer: Self-pay

## 2023-02-25 ENCOUNTER — Encounter (HOSPITAL_COMMUNITY)
Admission: RE | Admit: 2023-02-25 | Discharge: 2023-02-25 | Disposition: A | Discharge status: Home / Self Care | Payer: Medicare HMO | Source: Ambulatory Visit | Attending: Thoracic Surgery (Cardiothoracic Vascular Surgery) | Admitting: Thoracic Surgery (Cardiothoracic Vascular Surgery)

## 2023-02-25 ENCOUNTER — Ambulatory Visit (HOSPITAL_COMMUNITY)
Admission: RE | Admit: 2023-02-25 | Discharge: 2023-02-25 | Disposition: A | Discharge status: Home / Self Care | Payer: Medicare HMO | Source: Ambulatory Visit | Attending: Thoracic Surgery (Cardiothoracic Vascular Surgery) | Admitting: Thoracic Surgery (Cardiothoracic Vascular Surgery)

## 2023-02-25 VITALS — BP 182/64 | HR 67 | Temp 97.9°F | Resp 17 | Ht 63.0 in | Wt 194.0 lb

## 2023-02-25 DIAGNOSIS — I350 Nonrheumatic aortic (valve) stenosis: Secondary | ICD-10-CM | POA: Insufficient documentation

## 2023-02-25 DIAGNOSIS — Z01818 Encounter for other preprocedural examination: Secondary | ICD-10-CM | POA: Diagnosis not present

## 2023-02-25 DIAGNOSIS — E119 Type 2 diabetes mellitus without complications: Secondary | ICD-10-CM | POA: Insufficient documentation

## 2023-02-25 DIAGNOSIS — Z1152 Encounter for screening for COVID-19: Secondary | ICD-10-CM | POA: Diagnosis not present

## 2023-02-25 DIAGNOSIS — I25118 Atherosclerotic heart disease of native coronary artery with other forms of angina pectoris: Secondary | ICD-10-CM | POA: Insufficient documentation

## 2023-02-25 HISTORY — DX: Anxiety disorder, unspecified: F41.9

## 2023-02-25 HISTORY — DX: Sleep apnea, unspecified: G47.30

## 2023-02-25 HISTORY — DX: Myoneural disorder, unspecified: G70.9

## 2023-02-25 HISTORY — DX: Cardiac murmur, unspecified: R01.1

## 2023-02-25 HISTORY — DX: Other specified postprocedural states: Z98.890

## 2023-02-25 HISTORY — DX: Other specified postprocedural states: R11.2

## 2023-02-25 HISTORY — DX: Unspecified osteoarthritis, unspecified site: M19.90

## 2023-02-25 HISTORY — DX: Attention-deficit hyperactivity disorder, unspecified type: F90.9

## 2023-02-25 HISTORY — DX: Unspecified asthma, uncomplicated: J45.909

## 2023-02-25 LAB — PROTIME-INR
INR: 1 (ref 0.8–1.2)
Prothrombin Time: 13.4 seconds (ref 11.4–15.2)

## 2023-02-25 LAB — COMPREHENSIVE METABOLIC PANEL
ALT: 17 U/L (ref 0–44)
AST: 21 U/L (ref 15–41)
Albumin: 3.8 g/dL (ref 3.5–5.0)
Alkaline Phosphatase: 73 U/L (ref 38–126)
Anion gap: 13 (ref 5–15)
BUN: 18 mg/dL (ref 8–23)
CO2: 21 mmol/L — ABNORMAL LOW (ref 22–32)
Calcium: 9.2 mg/dL (ref 8.9–10.3)
Chloride: 103 mmol/L (ref 98–111)
Creatinine, Ser: 0.76 mg/dL (ref 0.44–1.00)
GFR, Estimated: >60 mL/min (ref >60)
Glucose, Bld: 69 mg/dL — ABNORMAL LOW (ref 70–99)
Potassium: 4.2 mmol/L (ref 3.5–5.1)
Sodium: 137 mmol/L (ref 135–145)
Total Bilirubin: 0.8 mg/dL (ref 0.3–1.2)
Total Protein: 6.9 g/dL (ref 6.5–8.1)

## 2023-02-25 LAB — URINALYSIS, ROUTINE W REFLEX MICROSCOPIC
Bilirubin Urine: NEGATIVE
Glucose, UA: NEGATIVE mg/dL
Hgb urine dipstick: NEGATIVE
Ketones, ur: NEGATIVE mg/dL
Leukocytes,Ua: NEGATIVE
Nitrite: NEGATIVE
Protein, ur: NEGATIVE mg/dL
Specific Gravity, Urine: 1.014 (ref 1.005–1.030)
pH: 6 (ref 5.0–8.0)

## 2023-02-25 LAB — CBC
HCT: 33.3 % — ABNORMAL LOW (ref 36.0–46.0)
Hemoglobin: 10.5 g/dL — ABNORMAL LOW (ref 12.0–15.0)
MCH: 28.8 pg (ref 26.0–34.0)
MCHC: 31.5 g/dL (ref 30.0–36.0)
MCV: 91.2 fL (ref 80.0–100.0)
Platelets: 291 10*3/uL (ref 150–400)
RBC: 3.65 MIL/uL — ABNORMAL LOW (ref 3.87–5.11)
RDW: 16.6 % — ABNORMAL HIGH (ref 11.5–15.5)
WBC: 5.8 10*3/uL (ref 4.0–10.5)
nRBC: 0 % (ref 0.0–0.2)

## 2023-02-25 LAB — BLOOD GAS, ARTERIAL
Acid-Base Excess: 3.7 mmol/L — ABNORMAL HIGH (ref 0.0–2.0)
Bicarbonate: 28.5 mmol/L — ABNORMAL HIGH (ref 20.0–28.0)
Drawn by: 6643
O2 Saturation: 99.1 %
Patient temperature: 37
pCO2 arterial: 43 mmHg (ref 32–48)
pH, Arterial: 7.43 (ref 7.35–7.45)
pO2, Arterial: 100 mmHg (ref 83–108)

## 2023-02-25 LAB — APTT: aPTT: 34 seconds (ref 24–36)

## 2023-02-25 LAB — SURGICAL PCR SCREEN
MRSA, PCR: NEGATIVE
Staphylococcus aureus: NEGATIVE

## 2023-02-25 LAB — GLUCOSE, CAPILLARY: Glucose-Capillary: 107 mg/dL — ABNORMAL HIGH (ref 70–99)

## 2023-02-25 NOTE — Progress Notes (Signed)
PCP -  Consuelo Claud Kelp, MD Cardiologist - Nelva Bush, MD  PPM/ICD - Denies  Chest x-ray - 02/25/2023 EKG - 02/17/2023 Stress Test - 05/13/2023 records requested ECHO - 01/20/2023 Cardiac Cath - 01/26/2023  Sleep Study - OSA+  [id number] II Fasting Blood Sugar - 100-110 Checks Blood Sugar three times a day  Last dose of GLP1 agonist-  Last does on 02/19/2023 GLP1 instructions: please hold trulicity 7 days prior to surgery. Last dose on 02/19/2023  Blood Thinner Instructions: N/A Aspirin Instructions: Follow surgeons instructions on Aspirin.  ERAS Protcol - NPO PRE-SURGERY Ensure or G2- N/A  COVID TEST- Yes   Anesthesia review: Yes, cardiac history  Patient denies shortness of breath, fever, cough and chest pain at PAT appointment   All instructions explained to the patient, with a verbal understanding of the material. Patient agrees to go over the instructions while at home for a better understanding. The opportunity to ask questions was provided.

## 2023-02-26 LAB — SARS CORONAVIRUS 2 (TAT 6-24 HRS): SARS Coronavirus 2: NEGATIVE

## 2023-03-01 MED ORDER — INSULIN REGULAR(HUMAN) IN NACL 100-0.9 UT/100ML-% IV SOLN
INTRAVENOUS | Status: AC
  Administered 2023-03-02: 1.8 [IU]/h via INTRAVENOUS
  Filled 2023-03-01: qty 100

## 2023-03-01 MED ORDER — MANNITOL 20 % IV SOLN
INTRAVENOUS | Status: DC
  Filled 2023-03-01: qty 13

## 2023-03-01 MED ORDER — DEXMEDETOMIDINE HCL IN NACL 400 MCG/100ML IV SOLN
0.1000 ug/kg/h | INTRAVENOUS | Status: AC
  Administered 2023-03-02: .3 ug/kg/h via INTRAVENOUS
  Filled 2023-03-01: qty 100

## 2023-03-01 MED ORDER — VANCOMYCIN HCL 1500 MG/300ML IV SOLN
1500.0000 mg | INTRAVENOUS | Status: AC
  Administered 2023-03-02: 1500 mg via INTRAVENOUS
  Filled 2023-03-01: qty 300

## 2023-03-01 MED ORDER — CEFAZOLIN SODIUM-DEXTROSE 2-4 GM/100ML-% IV SOLN
2.0000 g | INTRAVENOUS | Status: DC
  Filled 2023-03-01: qty 100

## 2023-03-01 MED ORDER — NOREPINEPHRINE 4 MG/250ML-% IV SOLN
0.0000 ug/min | INTRAVENOUS | Status: DC
  Filled 2023-03-01: qty 250

## 2023-03-01 MED ORDER — POTASSIUM CHLORIDE 2 MEQ/ML IV SOLN
80.0000 meq | INTRAVENOUS | Status: DC
  Filled 2023-03-01: qty 40

## 2023-03-01 MED ORDER — EPINEPHRINE HCL 5 MG/250ML IV SOLN IN NS
0.0000 ug/min | INTRAVENOUS | Status: DC
  Filled 2023-03-01: qty 250

## 2023-03-01 MED ORDER — MILRINONE LACTATE IN DEXTROSE 20-5 MG/100ML-% IV SOLN
0.3000 ug/kg/min | INTRAVENOUS | Status: DC
  Filled 2023-03-01: qty 100

## 2023-03-01 MED ORDER — TRANEXAMIC ACID 1000 MG/10ML IV SOLN
1.5000 mg/kg/h | INTRAVENOUS | Status: AC
  Administered 2023-03-02: 1.5 mg/kg/h via INTRAVENOUS
  Filled 2023-03-01: qty 25

## 2023-03-01 MED ORDER — VANCOMYCIN HCL 1000 MG IV SOLR
INTRAVENOUS | Status: DC
  Filled 2023-03-01: qty 20

## 2023-03-01 MED ORDER — PLASMA-LYTE A IV SOLN
INTRAVENOUS | Status: DC
  Filled 2023-03-01: qty 2.5

## 2023-03-01 MED ORDER — HEPARIN 30,000 UNITS/1000 ML (OHS) CELLSAVER SOLUTION
Status: DC
  Filled 2023-03-01: qty 1000

## 2023-03-01 MED ORDER — PHENYLEPHRINE HCL-NACL 20-0.9 MG/250ML-% IV SOLN
30.0000 ug/min | INTRAVENOUS | Status: AC
  Administered 2023-03-02: 50 ug/min via INTRAVENOUS
  Filled 2023-03-01: qty 250

## 2023-03-01 MED ORDER — CEFAZOLIN SODIUM-DEXTROSE 2-4 GM/100ML-% IV SOLN
2.0000 g | INTRAVENOUS | Status: AC
  Administered 2023-03-02 (×2): 2 g via INTRAVENOUS
  Filled 2023-03-01: qty 100

## 2023-03-01 MED ORDER — TRANEXAMIC ACID (OHS) PUMP PRIME SOLUTION
2.0000 mg/kg | INTRAVENOUS | Status: DC
  Filled 2023-03-01: qty 1.76

## 2023-03-01 MED ORDER — NITROGLYCERIN IN D5W 200-5 MCG/ML-% IV SOLN
2.0000 ug/min | INTRAVENOUS | Status: DC
  Filled 2023-03-01: qty 250

## 2023-03-01 MED ORDER — TRANEXAMIC ACID (OHS) BOLUS VIA INFUSION
15.0000 mg/kg | INTRAVENOUS | Status: AC
  Administered 2023-03-02: 1320 mg via INTRAVENOUS
  Filled 2023-03-01: qty 1320

## 2023-03-01 NOTE — Anesthesia Preprocedure Evaluation (Signed)
Anesthesia Evaluation  Patient identified by MRN, date of birth, ID band Patient awake    Reviewed: Allergy & Precautions, NPO status , Patient's Chart, lab work & pertinent test results  History of Anesthesia Complications (+) PONV and history of anesthetic complications  Airway Mallampati: III  TM Distance: >3 FB Neck ROM: Full    Dental  (+) Dental Advisory Given, Edentulous Upper, Edentulous Lower   Pulmonary asthma , sleep apnea , pneumonia, former smoker   Pulmonary exam normal breath sounds clear to auscultation       Cardiovascular hypertension, Pt. on medications and Pt. on home beta blockers + CAD and + Peripheral Vascular Disease  + Valvular Problems/Murmurs AS  Rhythm:Regular Rate:Normal + Systolic murmurs Echo [id number]  1. Left ventricular ejection fraction, by estimation, is 60 to 65%. The left ventricle has normal function. The left ventricle has no regional wall motion abnormalities. There is moderate left ventricular hypertrophy of the basal-septal segment. Left ventricular diastolic parameters are consistent with Grade I diastolic dysfunction (impaired relaxation). Elevated left ventricular end-diastolic pressure.   2. Right ventricular systolic function is normal. The right ventricular size is normal. Tricuspid regurgitation signal is inadequate for assessing PA pressure.   3. The mitral valve is normal in structure. Trivial mitral valve regurgitation. No evidence of mitral stenosis.   4. The aortic valve is calcified. There is severe calcifcation of the  aortic valve. There is severe thickening of the aortic valve. Aortic valve regurgitation is not visualized. Severe aortic valve stenosis. Aortic valve area, by VTI measures 0.79 cm. Aortic valve mean gradient measures 40.8 mmHg. Aortic valve Vmax measures 3.94 m/s.   5. The inferior vena cava is normal in size with greater than 50% respiratory variability, suggesting  right atrial pressure of 3 mmHg.      Neuro/Psych  PSYCHIATRIC DISORDERS Anxiety      Neuromuscular disease CVA, No Residual Symptoms    GI/Hepatic Neg liver ROS, PUD,,,  Endo/Other  diabetes    Renal/GU negative Renal ROS     Musculoskeletal  (+) Arthritis ,    Abdominal  (+) + obese  Peds  Hematology  (+) Blood dyscrasia, anemia   Anesthesia Other Findings   Reproductive/Obstetrics                             Anesthesia Physical Anesthesia Plan  ASA: 4  Anesthesia Plan: General   Post-op Pain Management:    Induction: Intravenous  PONV Risk Score and Plan: 4 or greater and Midazolam and Treatment may vary due to age or medical condition  Airway Management Planned: Oral ETT  Additional Equipment: Arterial line, PA Cath, CVP, TEE and Ultrasound Guidance Line Placement  Intra-op Plan: Utilization Of Total Body Hypothermia per surgeon request  Post-operative Plan: Post-operative intubation/ventilation  Informed Consent: I have reviewed the patients History and Physical, chart, labs and discussed the procedure including the risks, benefits and alternatives for the proposed anesthesia with the patient or authorized representative who has indicated his/her understanding and acceptance.     Dental advisory given  Plan Discussed with: CRNA  Anesthesia Plan Comments:         Anesthesia Quick Evaluation

## 2023-03-01 NOTE — H&P (Signed)
Joyce Potter,Joyce Potter             [phone]                                   Bridgitte R Flow  Medical Record [id number] Date of Birth: 1946/11/04   [name], MD [facility]   Chief Complaint: CP and DOE     History of Present Illness:     Pt is a very pleasant 77 yo female who has been having progressive exertional CP and DOE. Pt has progressed to where she is having these symptoms with minimal exertion and does not have at rest. She has a history of PCI of RCA following angina and has done well till recently. She also has had GI bleeding with resolution after colonoscopy however her iron levels have not stabilized and she requires iron infusions on occasion and her anemia will cause her symptoms to worsen. Her HCT was 30 last checked. 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 She has had a recent work up with echo with severe AS with a mean gradient of 54mHg. She has normal LV function. She had a cath with significant LAD stenosis with tight Diag stenosis felt to be a difficult PCI if attempted. Pt was felt best served with surgical revascularization and SAVR             Past Medical History:  Diagnosis Date   Anemia      iron deficiency   Aortic stenosis     Basal cell carcinoma     CAD (coronary artery disease)      s/p Left circumflex stent in 2012   Carotid stenosis     Cataract     High cholesterol     HTN (hypertension)     Hyperlipidemia     IDDM (insulin dependent diabetes mellitus)     Multiple thyroid nodules      Benign   Peptic ulcer disease     Retinal artery occlusion      on left           Past Surgical History:  Procedure Laterality Date   ABDOMINAL HYSTERECTOMY       ARTERY BIOPSY Left 11/19/2015    Procedure: BIOPSY TEMPORAL ARTERY;   Surgeon: JAlgernon Huxley MD;  Location: [facility] OR[facility];  Service: Vascular;  Laterality: Left;   BREAST BIOPSY Left 2002    core- neg   BREAST EXCISIONAL BIOPSY        ??? not visible scar on neither breast- pt believes it to be right breast    CARDIAC CATHETERIZATION N/A 08/08/2015    Procedure: Right and Left Heart Cath and Coronary Angiography;  Surgeon: KTeodoro Spray MD;  [facility];  Service: Cardiovascular;  Laterality: N/A;   CARDIAC CATHETERIZATION Bilateral 09/03/2016    Procedure: Right/Left Heart Cath and Coronary Angiography;  Surgeon: KTeodoro Spray MD;  [facility];  Service: Cardiovascular;  Laterality: Bilateral;   CATARACT EXTRACTION W/ INTRAOCULAR LENS  IMPLANT, BILATERAL       COLONOSCOPY        in 2013- internal hemorrhoids  COLONOSCOPY WITH PROPOFOL N/A 07/07/2018    Procedure: COLONOSCOPY WITH PROPOFOL;  Surgeon: Manya Silvas, MD;  Location: [facility] ENDOSCOPY;  Service: Endoscopy;  Laterality: N/A;   COLONOSCOPY WITH PROPOFOL N/A 05/22/2020    Procedure: COLONOSCOPY WITH PROPOFOL;  Surgeon: Jonathon Bellows, MD;  Location: [facility] ENDOSCOPY;  Service: Gastroenterology;  Laterality: N/A;   CORONARY ANGIOGRAPHY N/A 09/14/2017    Procedure: CORONARY ANGIOGRAPHY;  Surgeon: Teodoro Spray, MD;  Location: [place] [facility];  Service: Cardiovascular;  Laterality: N/A;   CORONARY ANGIOPLASTY       CORONARY STENT INTERVENTION N/A 05/15/2020    Procedure: coronary angiography;  Surgeon: Teodoro Spray, MD;  Location: [place] [facility];  Service: Cardiovascular;  Laterality: N/A;   CORONARY STENT INTERVENTION N/A 05/15/2020    Procedure: CORONARY STENT INTERVENTION;  Surgeon: Isaias Cowman, MD;  Location: [place] [facility];  Service: Cardiovascular;  Laterality: N/A;   CORONARY STENT PLACEMENT       ENDARTERECTOMY Left 10/01/2015    Procedure: ENDARTERECTOMY CAROTID;  Surgeon: Algernon Huxley, MD;  Location: [facility];  Service: Vascular;   Laterality: Left;   ENTEROSCOPY N/A 07/24/2020    Procedure: ENTEROSCOPY;  Surgeon: Virgel Manifold, MD;  Location: [facility] ENDOSCOPY;  Service: Endoscopy;  Laterality: N/A;   ESOPHAGOGASTRODUODENOSCOPY       ESOPHAGOGASTRODUODENOSCOPY (EGD) WITH PROPOFOL N/A 05/31/2016    Procedure: ESOPHAGOGASTRODUODENOSCOPY (EGD) WITH PROPOFOL;  Surgeon: Manya Silvas, MD;  Location: [facility] ENDOSCOPY;  Service: Endoscopy;  Laterality: N/A;   ESOPHAGOGASTRODUODENOSCOPY (EGD) WITH PROPOFOL N/A 07/07/2018    Procedure: ESOPHAGOGASTRODUODENOSCOPY (EGD) WITH PROPOFOL;  Surgeon: Manya Silvas, MD;  Location: [facility] ENDOSCOPY;  Service: Endoscopy;  Laterality: N/A;   ESOPHAGOGASTRODUODENOSCOPY (EGD) WITH PROPOFOL N/A 07/17/2018    Procedure: ESOPHAGOGASTRODUODENOSCOPY (EGD) WITH PROPOFOL;  Surgeon: Lucilla Lame, MD;  Location: [facility] ENDOSCOPY;  Service: Endoscopy;  Laterality: N/A;   ESOPHAGOGASTRODUODENOSCOPY (EGD) WITH PROPOFOL N/A 05/21/2020    Procedure: ESOPHAGOGASTRODUODENOSCOPY (EGD) WITH PROPOFOL;  Surgeon: Jonathon Bellows, MD;  Location: [facility] ENDOSCOPY;  Service: Gastroenterology;  Laterality: N/A;   ESOPHAGOGASTRODUODENOSCOPY (EGD) WITH PROPOFOL N/A 06/13/2020    Procedure: ESOPHAGOGASTRODUODENOSCOPY (EGD) WITH PROPOFOL;  Surgeon: Lucilla Lame, MD;  Location: [facility] ENDOSCOPY;  Service: Endoscopy;  Laterality: N/A;   EYE SURGERY       GIVENS CAPSULE STUDY N/A 04/17/2013    Procedure: GIVENS CAPSULE STUDY;  Surgeon: Arta Silence, MD;  Location: [facility] ENDOSCOPY;  Service: Endoscopy;  Laterality: N/A;  patient ate breakfast at [place] 06/11/2020    Procedure: GIVENS CAPSULE STUDY;  Surgeon: Virgel Manifold, MD;  Location: [facility] ENDOSCOPY;  Service: Endoscopy;  Laterality: N/A;   GIVENS CAPSULE STUDY N/A 01/29/2021    Procedure: GIVENS CAPSULE STUDY;  Surgeon: Virgel Manifold, MD;  Location: [facility] ENDOSCOPY;  Service: Endoscopy;  Laterality: N/A;   GIVENS CAPSULE STUDY N/A 08/24/2021     Procedure: GIVENS CAPSULE STUDY;  Surgeon: Virgel Manifold, MD;  Location: [facility] ENDOSCOPY;  Service: Endoscopy;  Laterality: N/A;   HEMORRHOID BANDING   07/27/2016    Dr Nicholes Stairs   LEFT HEART CATH AND CORONARY ANGIOGRAPHY N/A 07/08/2022    Procedure: LEFT HEART CATH AND CORONARY ANGIOGRAPHY;  Surgeon: Dionisio David, MD;  Location: [place] [facility];  Service: Cardiovascular;  Laterality: N/A;   PARTIAL HYSTERECTOMY       RIGHT AND LEFT HEART CATH Bilateral 09/14/2017    Procedure: RIGHT AND LEFT HEART CATH;  Surgeon: Bartholome Bill  A, MD;  Location: [place] [facility];  Service: Cardiovascular;  Laterality: Bilateral;   RIGHT HEART CATH AND CORONARY ANGIOGRAPHY N/A 01/26/2023    Procedure: RIGHT HEART CATH AND CORONARY ANGIOGRAPHY;  Surgeon: Early Osmond, MD;  Location: [place] [facility];  Service: Cardiovascular;  Laterality: N/A;   RIGHT/LEFT HEART CATH AND CORONARY ANGIOGRAPHY Right 05/15/2020    Procedure: Right/Left Heart cath;  Surgeon: Teodoro Spray, MD;  Location: [place] [facility];  Service: Cardiovascular;  Laterality: Right;   TRIGGER FINGER RELEASE          Social History        Tobacco Use  Smoking Status Former   Types: Cigarettes   Quit date: 02/15/1985   Years since quitting: 38.0  Smokeless Tobacco Never  Tobacco Comments    quit about 31 years ago    Social History        Substance and Sexual Activity  Alcohol Use No   Alcohol/week: 0.0 standard drinks of alcohol      Social History         Socioeconomic History   Marital status: Married      Spouse name: [name]   Number of children: 5   Years of education: Not on file   Highest education level: Not on file  Occupational History   Occupation: retired   Tobacco Use   Smoking status: Former      Types: Cigarettes      Quit date: 02/15/1985      Years since quitting: 38.0   Smokeless tobacco: Never   Tobacco comments:      quit about 31 years ago  Vaping Use   Vaping Use: Never used   Substance and Sexual Activity   Alcohol use: No      Alcohol/week: 0.0 standard drinks of alcohol   Drug use: No   Sexual activity: Not on file  Other Topics Concern   Not on file  Social History Narrative    Lives at home independently.    Organ donor    Had 5 children- son timothy    Son died in 45    Social Determinants of Health        Financial Resource Strain: Low Risk  (05/31/2022)    Overall Financial Resource Strain (CARDIA)     Difficulty of Paying Living Expenses: Not hard at all  Food Insecurity: No Food Insecurity (02/18/2022)    Hunger Vital Sign     Worried About Running Out of Food in the Last Year: Never true     Ran Out of Food in the Last Year: Never true  Transportation Needs: No Transportation Needs (05/31/2022)    PRAPARE - Armed forces logistics/support/administrative officer (Medical): No     Lack of Transportation (Non-Medical): No  Physical Activity: Not on file  Stress: No Stress Concern Present (02/18/2022)    [place]     Feeling of Stress : Only a little  Social Connections: Socially Integrated (12/16/2021)    Social Connection and Isolation Panel [NHANES]     Frequency of Communication with Friends and Family: More than three times a week     Frequency of Social Gatherings with Friends and Family: More than three times a week     Attends Religious Services: More than 4 times per year     Active Member of [employer] or Organizations: Yes     Attends [employer]  or Organization Meetings: More than 4 times per year     Marital Status: Married  Human resources officer Violence: Not At Risk (01/18/2022)    Humiliation, Afraid, Rape, and Kick questionnaire     Fear of Current or Ex-Partner: No     Emotionally Abused: No     Physically Abused: No     Sexually Abused: No           Allergies  Allergen Reactions   Invokamet [Canagliflozin-Metformin Hcl] Other (See Comments)      Yeast Infection   Lovastatin  Rash   Penicillins Rash      Has patient had a PCN reaction causing immediate rash, facial/tongue/throat swelling, SOB or lightheadedness with hypotension:Yes Has patient had a PCN reaction causing severe rash involving mucus membranes or skin necrosis:all over body Has patient had a PCN reaction that required hospitalization: No Has patient had a PCN reaction occurring within the last 10 years: No If all of the above answers are "NO", then may proceed with Cephalosporin use.     Vicodin [Hydrocodone-Acetaminophen] Rash            Current Outpatient Medications  Medication Sig Dispense Refill   albuterol (VENTOLIN HFA) 108 (90 Base) MCG/ACT inhaler Inhale 2 puffs into the lungs every 4 (four) hours as needed for wheezing or shortness of breath.        amitriptyline (ELAVIL) 25 MG tablet Take 25 mg by mouth at bedtime as needed for sleep.        aspirin EC 81 MG EC tablet Take 1 tablet (81 mg total) by mouth daily. Swallow whole. 30 tablet 11   BD INSULIN SYRINGE U/F 31G X 5/16" 1 ML MISC         Dulaglutide 3 MG/0.5ML SOPN Inject 3 mg into the skin every Saturday.       Evolocumab (REPATHA SURECLICK) [id number] MG/ML SOAJ Inject 140 mg into the skin every 14 (fourteen) days. 2 mL 11   insulin NPH-regular Human (NOVOLIN 70/30) (70-30) 100 UNIT/ML injection Inject 30 Units into the skin with breakfast, with lunch, and with evening meal.       isosorbide mononitrate (IMDUR) 60 MG 24 hr tablet Take 60 mg by mouth 2 (two) times daily.       losartan (COZAAR) 25 MG tablet Take 25 mg by mouth daily.       metoprolol succinate (TOPROL-XL) 100 MG 24 hr tablet Take 100-200 mg by mouth See admin instructions. Take 200 mg by mouth in the morning and 100 mg in the evening       Multiple Vitamin (MULTI-VITAMIN) tablet Take 1 tablet by mouth daily.       ONETOUCH VERIO test strip         pantoprazole (PROTONIX) 40 MG tablet Take 40 mg by mouth daily.       sertraline (ZOLOFT) 25 MG tablet Take 1 tablet (25 mg  total) by mouth daily. 30 tablet 0   SPIRIVA HANDIHALER 18 MCG inhalation capsule Place 18 mcg into inhaler and inhale at bedtime.       traMADol (ULTRAM) 50 MG tablet Take 50 mg by mouth every 6 (six) hours as needed for moderate pain.       Vitamin A 2400 MCG (8000 UT) CAPS Take 8,000 Units by mouth daily.        No current facility-administered medications for this visit.             Facility-Administered Medications Ordered in Other  Visits  Medication Dose Route Frequency Provider Last Rate Last Admin   heparin lock flush 100 unit/mL  500 Units Intravenous Once Verlon Au, NP       sodium chloride flush (NS) 0.9 % injection 10 mL  10 mL Intravenous Once Verlon Au, NP       sodium chloride flush (NS) 0.9 % injection 3 mL  3 mL Intravenous Q12H Scoggins, Amber, NP                 Family History  Problem Relation Age of Onset   Uterine cancer Mother     Breast cancer Mother 71   Seizures Father     Stroke Father     Diabetes Father     COPD Father     Colon cancer Neg Hx              Physical Exam: Neuro: alert and no focal deficits other than left eye central blindness Teeth: dentures Card: RR with 4/6 sem Ext: no vericosities           Diagnostic Studies & Laboratory data: I have personally reviewed the following studies and agree with the findings   TTE (12/2022) IMPRESSIONS     1. Left ventricular ejection fraction, by estimation, is 60 to 65%. The  left ventricle has normal function. The left ventricle has no regional  wall motion abnormalities. There is moderate left ventricular hypertrophy  of the basal-septal segment. Left  ventricular diastolic parameters are consistent with Grade I diastolic  dysfunction (impaired relaxation). Elevated left ventricular end-diastolic  pressure.   2. Right ventricular systolic function is normal. The right ventricular  size is normal. Tricuspid regurgitation signal is inadequate for assessing  PA pressure.    3. The mitral valve is normal in structure. Trivial mitral valve  regurgitation. No evidence of mitral stenosis.   4. The aortic valve is calcified. There is severe calcifcation of the  aortic valve. There is severe thickening of the aortic valve. Aortic valve  regurgitation is not visualized. Severe aortic valve stenosis. Aortic  valve area, by VTI measures 0.79 cm.  Aortic valve mean gradient measures 40.8 mmHg. Aortic valve Vmax measures  3.94 m/s.   5. The inferior vena cava is normal in size with greater than 50%  respiratory variability, suggesting right atrial pressure of 3 mmHg.   FINDINGS   Left Ventricle: Left ventricular ejection fraction, by estimation, is 60  to 65%. The left ventricle has normal function. The left ventricle has no  regional wall motion abnormalities. The left ventricular internal cavity  size was normal in size. There is   moderate left ventricular hypertrophy of the basal-septal segment. Left  ventricular diastolic parameters are consistent with Grade I diastolic  dysfunction (impaired relaxation). Elevated left ventricular end-diastolic  pressure.   Right Ventricle: The right ventricular size is normal. No increase in  right ventricular wall thickness. Right ventricular systolic function is  normal. Tricuspid regurgitation signal is inadequate for assessing PA  pressure.   Left Atrium: Left atrial size was normal in size.   Right Atrium: Right atrial size was normal in size.   Pericardium: There is no evidence of pericardial effusion.   Mitral Valve: The mitral valve is normal in structure. Mild to moderate  mitral annular calcification. Trivial mitral valve regurgitation. No  evidence of mitral valve stenosis.   Tricuspid Valve: The tricuspid valve is normal in structure. Tricuspid  valve regurgitation is trivial. No evidence of  tricuspid stenosis.   Aortic Valve: The aortic valve is calcified. There is severe calcifcation  of the aortic  valve. There is severe thickening of the aortic valve.  Aortic valve regurgitation is not visualized. Aortic regurgitation PHT  measures 283 msec. Severe aortic  stenosis is present. Aortic valve mean gradient measures 40.8 mmHg. Aortic  valve peak gradient measures 62.2 mmHg. Aortic valve area, by VTI measures  0.79 cm.   Pulmonic Valve: The pulmonic valve was normal in structure. Pulmonic valve  regurgitation is not visualized. No evidence of pulmonic stenosis.   Aorta: The aortic root is normal in size and structure.   Venous: The inferior vena cava is normal in size with greater than 50%  respiratory variability, suggesting right atrial pressure of 3 mmHg.   IAS/Shunts: No atrial level shunt detected by color flow Doppler.     LEFT VENTRICLE  PLAX 2D  LVIDd:         4.30 cm   Diastology  LVIDs:         2.50 cm   LV e' medial:    5.28 cm/s  LV PW:         1.10 cm   LV E/e' medial:  21.5  LV IVS:        1.10 cm   LV e' lateral:   7.83 cm/s  LVOT diam:     2.10 cm   LV E/e' lateral: 14.5  LV SV:         82  LV SV Index:   42  LVOT Area:     3.46 cm     RIGHT VENTRICLE             IVC  RV Basal diam:  3.70 cm     IVC diam: 1.60 cm  RV S prime:     12.55 cm/s  TAPSE (M-mode): 2.4 cm   LEFT ATRIUM             Index        RIGHT ATRIUM          Index  LA diam:        4.60 cm 2.36 cm/m   RA Area:     9.69 cm  LA Vol (A2C):   67.5 ml 34.64 ml/m  RA Volume:   19.80 ml 10.16 ml/m  LA Vol (A4C):   54.0 ml 27.71 ml/m  LA Biplane Vol: 62.9 ml 32.28 ml/m   AORTIC VALVE  AV Area (Vmax):    0.86 cm  AV Area (Vmean):   0.76 cm  AV Area (VTI):     0.79 cm  AV Vmax:           394.25 cm/s  AV Vmean:          307.250 cm/s  AV VTI:            1.040 m  AV Peak Grad:      62.2 mmHg  AV Mean Grad:      40.8 mmHg  LVOT Vmax:         97.53 cm/s  LVOT Vmean:        67.833 cm/s  LVOT VTI:          0.237 m  LVOT/AV VTI ratio: 0.23  AI PHT:            283 msec    AORTA  Ao Root  diam: 3.10 cm  Ao Asc diam:  3.70 cm   MITRAL  VALVE  MV Area (PHT): 3.45 cm     SHUNTS  MV Decel Time: 220 msec     Systemic VTI:  0.24 m  MV E velocity: 113.50 cm/s  Systemic Diam: 2.10 cm  MV A velocity: 135.50 cm/s  MV E/A ratio:  0.84    CATH (12/2022) Conclusion       Prox LAD lesion is 55% stenosed.   Prox RCA lesion is 20% stenosed.   1st Diag-1 lesion is 95% stenosed.   1st Diag-2 lesion is 90% stenosed.   1.  High-grade ostial and mid body diagonal disease with RFR positive proximal LAD disease (0.86). 2.  Patent proximal right coronary artery stent with minimal in-stent restenosis.    Recent Radiology Findings:   CT scan Non contrast (01/2023) FINDINGS: Cardiovascular: The heart size is normal. No substantial pericardial effusion. Coronary artery calcification is evident. Mild atherosclerotic calcification is noted in the wall of the thoracic aorta. Ascending thoracic aorta measures 3.3 cm diameter. Aortic valve calcification evident. Mitral annular calcification evident.   Mediastinum/Nodes: No mediastinal lymphadenopathy. No evidence for gross hilar lymphadenopathy although assessment is limited by the lack of intravenous contrast on the current study. The esophagus has normal imaging features. There is no axillary lymphadenopathy.   Lungs/Pleura: 4 mm right middle lobe nodule on 65/4 is stable. No followup imaging is recommended. No new suspicious pulmonary nodule or mass. Scarring noted inferior left lower lobe in a region adjacent to multiple nonacute left rib fractures. A 2 mm peripheral left upper lobe nodule on 32/4 is unchanged as is a 2-3 mm left lower lobe nodule on 73/4. No followup imaging is recommended. No focal airspace consolidation. There is no evidence of pleural effusion.   Upper Abdomen: Visualized portion of the upper abdomen is unremarkable.   Musculoskeletal: No worrisome lytic or sclerotic osseous abnormality.   IMPRESSION: 1. No  acute findings in the chest. 2. Aortic atherosclerosis with evidence of aortic valve calcification and mitral annular calcification. 3. Stable tiny bilateral pulmonary nodules, consistent with benign etiology. No followup imaging is recommended. 4. Multiple nonacute left rib fractures with adjacent scarring in the left lower lobe. 5.  Aortic Atherosclerosis (ICD10-I70.0).     Recent Lab Findings: Recent Labs       Lab Results  Component Value Date    WBC 6.7 02/02/2023    HGB 9.8 (L) 02/02/2023    HCT 30.9 (L) 02/02/2023    PLT 288 02/02/2023    GLUCOSE 169 (H) 01/21/2023    CHOL 130 05/21/2020    TRIG 81 05/21/2020    HDL 35 (L) 05/21/2020    LDLCALC 79 05/21/2020    ALT 14 07/21/2020    AST 18 07/21/2020    NA 143 01/26/2023    K 4.0 01/26/2023    CL 101 01/21/2023    CREATININE 0.88 01/21/2023    BUN 15 01/21/2023    CO2 25 01/21/2023    INR 1.0 06/12/2020    HGBA1C 7.3 (H) 05/21/2020            Assessment / Plan:     77 yo female with NYHA class 3 symptoms of severe AS and CAD. Pt at slightly higher risk for surgery secondary to her PVD and previous retinal occlusion and her iron issues/anemia but I feel should do well with SAVR/CABG. She has currently a difficult situation with her demented husband at home and is stressed over managing him and caring for herself. Will need resources for this  following her surgery Pt understands the risks of surgery and wishes to proceed next Tuesday.

## 2023-03-02 ENCOUNTER — Other Ambulatory Visit: Payer: Self-pay

## 2023-03-02 ENCOUNTER — Inpatient Hospital Stay (HOSPITAL_COMMUNITY): Payer: Medicare HMO

## 2023-03-02 ENCOUNTER — Encounter (HOSPITAL_COMMUNITY): Payer: Self-pay | Admitting: Thoracic Surgery (Cardiothoracic Vascular Surgery)

## 2023-03-02 ENCOUNTER — Inpatient Hospital Stay (HOSPITAL_COMMUNITY): Payer: Medicare HMO | Admitting: Physician Assistant

## 2023-03-02 ENCOUNTER — Encounter (HOSPITAL_COMMUNITY)
Admission: RE | Disposition: A | Discharge status: Skilled Nursing Facility | Payer: Self-pay | Source: Home / Self Care | Attending: Thoracic Surgery (Cardiothoracic Vascular Surgery)

## 2023-03-02 ENCOUNTER — Inpatient Hospital Stay (HOSPITAL_COMMUNITY): Payer: Medicare HMO | Admitting: Certified Registered Nurse Anesthetist

## 2023-03-02 ENCOUNTER — Inpatient Hospital Stay (HOSPITAL_COMMUNITY)
Admission: RE | Admit: 2023-03-02 | Discharge: 2023-03-15 | DRG: 219 | Disposition: A | Discharge status: Skilled Nursing Facility | Payer: Medicare HMO | Attending: Thoracic Surgery (Cardiothoracic Vascular Surgery) | Admitting: Thoracic Surgery (Cardiothoracic Vascular Surgery)

## 2023-03-02 DIAGNOSIS — J45909 Unspecified asthma, uncomplicated: Secondary | ICD-10-CM | POA: Diagnosis present

## 2023-03-02 DIAGNOSIS — E876 Hypokalemia: Secondary | ICD-10-CM | POA: Diagnosis not present

## 2023-03-02 DIAGNOSIS — Z823 Family history of stroke: Secondary | ICD-10-CM

## 2023-03-02 DIAGNOSIS — I959 Hypotension, unspecified: Secondary | ICD-10-CM | POA: Diagnosis not present

## 2023-03-02 DIAGNOSIS — I10 Essential (primary) hypertension: Secondary | ICD-10-CM | POA: Diagnosis present

## 2023-03-02 DIAGNOSIS — Z794 Long term (current) use of insulin: Secondary | ICD-10-CM

## 2023-03-02 DIAGNOSIS — D638 Anemia in other chronic diseases classified elsewhere: Secondary | ICD-10-CM | POA: Diagnosis present

## 2023-03-02 DIAGNOSIS — Z8049 Family history of malignant neoplasm of other genital organs: Secondary | ICD-10-CM

## 2023-03-02 DIAGNOSIS — Z955 Presence of coronary angioplasty implant and graft: Secondary | ICD-10-CM

## 2023-03-02 DIAGNOSIS — Z882 Allergy status to sulfonamides status: Secondary | ICD-10-CM

## 2023-03-02 DIAGNOSIS — I2510 Atherosclerotic heart disease of native coronary artery without angina pectoris: Secondary | ICD-10-CM | POA: Diagnosis present

## 2023-03-02 DIAGNOSIS — Z87891 Personal history of nicotine dependence: Secondary | ICD-10-CM

## 2023-03-02 DIAGNOSIS — J189 Pneumonia, unspecified organism: Secondary | ICD-10-CM | POA: Diagnosis not present

## 2023-03-02 DIAGNOSIS — I480 Paroxysmal atrial fibrillation: Secondary | ICD-10-CM | POA: Diagnosis present

## 2023-03-02 DIAGNOSIS — M7981 Nontraumatic hematoma of soft tissue: Secondary | ICD-10-CM | POA: Diagnosis not present

## 2023-03-02 DIAGNOSIS — E8770 Fluid overload, unspecified: Secondary | ICD-10-CM | POA: Diagnosis present

## 2023-03-02 DIAGNOSIS — I6521 Occlusion and stenosis of right carotid artery: Secondary | ICD-10-CM | POA: Diagnosis present

## 2023-03-02 DIAGNOSIS — Z8673 Personal history of transient ischemic attack (TIA), and cerebral infarction without residual deficits: Secondary | ICD-10-CM

## 2023-03-02 DIAGNOSIS — D62 Acute posthemorrhagic anemia: Secondary | ICD-10-CM | POA: Diagnosis not present

## 2023-03-02 DIAGNOSIS — Z79899 Other long term (current) drug therapy: Secondary | ICD-10-CM | POA: Diagnosis not present

## 2023-03-02 DIAGNOSIS — J9601 Acute respiratory failure with hypoxia: Secondary | ICD-10-CM | POA: Diagnosis not present

## 2023-03-02 DIAGNOSIS — D696 Thrombocytopenia, unspecified: Secondary | ICD-10-CM | POA: Diagnosis not present

## 2023-03-02 DIAGNOSIS — Z885 Allergy status to narcotic agent status: Secondary | ICD-10-CM

## 2023-03-02 DIAGNOSIS — E11649 Type 2 diabetes mellitus with hypoglycemia without coma: Secondary | ICD-10-CM | POA: Diagnosis not present

## 2023-03-02 DIAGNOSIS — E1151 Type 2 diabetes mellitus with diabetic peripheral angiopathy without gangrene: Secondary | ICD-10-CM

## 2023-03-02 DIAGNOSIS — Z888 Allergy status to other drugs, medicaments and biological substances status: Secondary | ICD-10-CM

## 2023-03-02 DIAGNOSIS — Z880 Allergy status to penicillin: Secondary | ICD-10-CM

## 2023-03-02 DIAGNOSIS — J90 Pleural effusion, not elsewhere classified: Secondary | ICD-10-CM | POA: Diagnosis not present

## 2023-03-02 DIAGNOSIS — Z951 Presence of aortocoronary bypass graft: Principal | ICD-10-CM

## 2023-03-02 DIAGNOSIS — E7800 Pure hypercholesterolemia, unspecified: Secondary | ICD-10-CM | POA: Diagnosis present

## 2023-03-02 DIAGNOSIS — I350 Nonrheumatic aortic (valve) stenosis: Secondary | ICD-10-CM | POA: Diagnosis present

## 2023-03-02 DIAGNOSIS — Z7985 Long-term (current) use of injectable non-insulin antidiabetic drugs: Secondary | ICD-10-CM

## 2023-03-02 DIAGNOSIS — Z85828 Personal history of other malignant neoplasm of skin: Secondary | ICD-10-CM

## 2023-03-02 DIAGNOSIS — N170 Acute kidney failure with tubular necrosis: Secondary | ICD-10-CM | POA: Diagnosis not present

## 2023-03-02 DIAGNOSIS — F419 Anxiety disorder, unspecified: Secondary | ICD-10-CM | POA: Diagnosis present

## 2023-03-02 DIAGNOSIS — J9811 Atelectasis: Secondary | ICD-10-CM | POA: Diagnosis not present

## 2023-03-02 DIAGNOSIS — H5462 Unqualified visual loss, left eye, normal vision right eye: Secondary | ICD-10-CM | POA: Diagnosis present

## 2023-03-02 DIAGNOSIS — Z825 Family history of asthma and other chronic lower respiratory diseases: Secondary | ICD-10-CM

## 2023-03-02 DIAGNOSIS — I25118 Atherosclerotic heart disease of native coronary artery with other forms of angina pectoris: Secondary | ICD-10-CM | POA: Diagnosis not present

## 2023-03-02 DIAGNOSIS — G4733 Obstructive sleep apnea (adult) (pediatric): Secondary | ICD-10-CM | POA: Diagnosis present

## 2023-03-02 DIAGNOSIS — Z952 Presence of prosthetic heart valve: Secondary | ICD-10-CM

## 2023-03-02 DIAGNOSIS — Z833 Family history of diabetes mellitus: Secondary | ICD-10-CM

## 2023-03-02 DIAGNOSIS — Z7982 Long term (current) use of aspirin: Secondary | ICD-10-CM

## 2023-03-02 DIAGNOSIS — Z6838 Body mass index (BMI) 38.0-38.9, adult: Secondary | ICD-10-CM

## 2023-03-02 DIAGNOSIS — J9600 Acute respiratory failure, unspecified whether with hypoxia or hypercapnia: Secondary | ICD-10-CM

## 2023-03-02 DIAGNOSIS — Z803 Family history of malignant neoplasm of breast: Secondary | ICD-10-CM

## 2023-03-02 HISTORY — PX: AORTIC VALVE REPLACEMENT: SHX41

## 2023-03-02 HISTORY — PX: TEE WITHOUT CARDIOVERSION: SHX5443

## 2023-03-02 HISTORY — PX: CORONARY ARTERY BYPASS GRAFT: SHX141

## 2023-03-02 LAB — POCT I-STAT, CHEM 8
BUN: 14 mg/dL (ref 8–23)
BUN: 12 mg/dL (ref 8–23)
BUN: 12 mg/dL (ref 8–23)
BUN: 12 mg/dL (ref 8–23)
BUN: 13 mg/dL (ref 8–23)
Calcium, Ion: 1.27 mmol/L (ref 1.15–1.40)
Calcium, Ion: 1.17 mmol/L (ref 1.15–1.40)
Calcium, Ion: 1.09 mmol/L — ABNORMAL LOW (ref 1.15–1.40)
Calcium, Ion: 1.05 mmol/L — ABNORMAL LOW (ref 1.15–1.40)
Calcium, Ion: 1.01 mmol/L — ABNORMAL LOW (ref 1.15–1.40)
Chloride: 104 mmol/L (ref 98–111)
Chloride: 104 mmol/L (ref 98–111)
Chloride: 103 mmol/L (ref 98–111)
Chloride: 101 mmol/L (ref 98–111)
Chloride: 101 mmol/L (ref 98–111)
Creatinine, Ser: 0.6 mg/dL (ref 0.44–1.00)
Creatinine, Ser: 0.5 mg/dL (ref 0.44–1.00)
Creatinine, Ser: 0.5 mg/dL (ref 0.44–1.00)
Creatinine, Ser: 0.5 mg/dL (ref 0.44–1.00)
Creatinine, Ser: 0.5 mg/dL (ref 0.44–1.00)
Glucose, Bld: 83 mg/dL (ref 70–99)
Glucose, Bld: 84 mg/dL (ref 70–99)
Glucose, Bld: 92 mg/dL (ref 70–99)
Glucose, Bld: 106 mg/dL — ABNORMAL HIGH (ref 70–99)
Glucose, Bld: 218 mg/dL — ABNORMAL HIGH (ref 70–99)
HCT: 28 % — ABNORMAL LOW (ref 36.0–46.0)
HCT: 24 % — ABNORMAL LOW (ref 36.0–46.0)
HCT: 20 % — ABNORMAL LOW (ref 36.0–46.0)
HCT: 19 % — ABNORMAL LOW (ref 36.0–46.0)
HCT: 26 % — ABNORMAL LOW (ref 36.0–46.0)
Hemoglobin: 9.5 g/dL — ABNORMAL LOW (ref 12.0–15.0)
Hemoglobin: 8.2 g/dL — ABNORMAL LOW (ref 12.0–15.0)
Hemoglobin: 6.8 g/dL — CL (ref 12.0–15.0)
Hemoglobin: 6.5 g/dL — CL (ref 12.0–15.0)
Hemoglobin: 8.8 g/dL — ABNORMAL LOW (ref 12.0–15.0)
Potassium: 3.9 mmol/L (ref 3.5–5.1)
Potassium: 3.5 mmol/L (ref 3.5–5.1)
Potassium: 5.2 mmol/L — ABNORMAL HIGH (ref 3.5–5.1)
Potassium: 4.6 mmol/L (ref 3.5–5.1)
Potassium: 4.8 mmol/L (ref 3.5–5.1)
Sodium: 141 mmol/L (ref 135–145)
Sodium: 139 mmol/L (ref 135–145)
Sodium: 138 mmol/L (ref 135–145)
Sodium: 138 mmol/L (ref 135–145)
Sodium: 137 mmol/L (ref 135–145)
TCO2: 28 mmol/L (ref 22–32)
TCO2: 24 mmol/L (ref 22–32)
TCO2: 26 mmol/L (ref 22–32)
TCO2: 27 mmol/L (ref 22–32)
TCO2: 23 mmol/L (ref 22–32)

## 2023-03-02 LAB — BASIC METABOLIC PANEL
Anion gap: 7 (ref 5–15)
BUN: 13 mg/dL (ref 8–23)
CO2: 25 mmol/L (ref 22–32)
Calcium: 7.8 mg/dL — ABNORMAL LOW (ref 8.9–10.3)
Chloride: 108 mmol/L (ref 98–111)
Creatinine, Ser: 0.79 mg/dL (ref 0.44–1.00)
GFR, Estimated: >60 mL/min (ref >60)
Glucose, Bld: 144 mg/dL — ABNORMAL HIGH (ref 70–99)
Potassium: 4.3 mmol/L (ref 3.5–5.1)
Sodium: 140 mmol/L (ref 135–145)

## 2023-03-02 LAB — POCT I-STAT 7, (LYTES, BLD GAS, ICA,H+H)
Acid-Base Excess: 0 mmol/L (ref 0.0–2.0)
Acid-Base Excess: 0 mmol/L (ref 0.0–2.0)
Acid-base deficit: 3 mmol/L — ABNORMAL HIGH (ref 0.0–2.0)
Acid-base deficit: 1 mmol/L (ref 0.0–2.0)
Acid-base deficit: 2 mmol/L (ref 0.0–2.0)
Acid-base deficit: 5 mmol/L — ABNORMAL HIGH (ref 0.0–2.0)
Acid-base deficit: 1 mmol/L (ref 0.0–2.0)
Bicarbonate: 25.5 mmol/L (ref 20.0–28.0)
Bicarbonate: 21.8 mmol/L (ref 20.0–28.0)
Bicarbonate: 22.6 mmol/L (ref 20.0–28.0)
Bicarbonate: 23.8 mmol/L (ref 20.0–28.0)
Bicarbonate: 20.8 mmol/L (ref 20.0–28.0)
Bicarbonate: 24.7 mmol/L (ref 20.0–28.0)
Bicarbonate: 25.7 mmol/L (ref 20.0–28.0)
Calcium, Ion: 1.28 mmol/L (ref 1.15–1.40)
Calcium, Ion: 1.03 mmol/L — ABNORMAL LOW (ref 1.15–1.40)
Calcium, Ion: 1.03 mmol/L — ABNORMAL LOW (ref 1.15–1.40)
Calcium, Ion: 1.04 mmol/L — ABNORMAL LOW (ref 1.15–1.40)
Calcium, Ion: 1.06 mmol/L — ABNORMAL LOW (ref 1.15–1.40)
Calcium, Ion: 1.07 mmol/L — ABNORMAL LOW (ref 1.15–1.40)
Calcium, Ion: 1.16 mmol/L (ref 1.15–1.40)
HCT: 26 % — ABNORMAL LOW (ref 36.0–46.0)
HCT: 19 % — ABNORMAL LOW (ref 36.0–46.0)
HCT: 20 % — ABNORMAL LOW (ref 36.0–46.0)
HCT: 25 % — ABNORMAL LOW (ref 36.0–46.0)
HCT: 26 % — ABNORMAL LOW (ref 36.0–46.0)
HCT: 18 % — ABNORMAL LOW (ref 36.0–46.0)
HCT: 18 % — ABNORMAL LOW (ref 36.0–46.0)
Hemoglobin: 8.8 g/dL — ABNORMAL LOW (ref 12.0–15.0)
Hemoglobin: 6.5 g/dL — CL (ref 12.0–15.0)
Hemoglobin: 6.8 g/dL — CL (ref 12.0–15.0)
Hemoglobin: 8.5 g/dL — ABNORMAL LOW (ref 12.0–15.0)
Hemoglobin: 8.8 g/dL — ABNORMAL LOW (ref 12.0–15.0)
Hemoglobin: 6.1 g/dL — CL (ref 12.0–15.0)
Hemoglobin: 6.1 g/dL — CL (ref 12.0–15.0)
O2 Saturation: 100 %
O2 Saturation: 100 %
O2 Saturation: 100 %
O2 Saturation: 100 %
O2 Saturation: 99 %
O2 Saturation: 100 %
O2 Saturation: 99 %
Patient temperature: 35.9
Patient temperature: 35.9
Patient temperature: 36.4
Potassium: 3.9 mmol/L (ref 3.5–5.1)
Potassium: 4 mmol/L (ref 3.5–5.1)
Potassium: 4.6 mmol/L (ref 3.5–5.1)
Potassium: 4.8 mmol/L (ref 3.5–5.1)
Potassium: 4.3 mmol/L (ref 3.5–5.1)
Potassium: 4.1 mmol/L (ref 3.5–5.1)
Potassium: 4.4 mmol/L (ref 3.5–5.1)
Sodium: 141 mmol/L (ref 135–145)
Sodium: 139 mmol/L (ref 135–145)
Sodium: 136 mmol/L (ref 135–145)
Sodium: 138 mmol/L (ref 135–145)
Sodium: 139 mmol/L (ref 135–145)
Sodium: 142 mmol/L (ref 135–145)
Sodium: 140 mmol/L (ref 135–145)
TCO2: 27 mmol/L (ref 22–32)
TCO2: 23 mmol/L (ref 22–32)
TCO2: 24 mmol/L (ref 22–32)
TCO2: 25 mmol/L (ref 22–32)
TCO2: 22 mmol/L (ref 22–32)
TCO2: 26 mmol/L (ref 22–32)
TCO2: 27 mmol/L (ref 22–32)
pCO2 arterial: 43.3 mmHg (ref 32–48)
pCO2 arterial: 36 mmHg (ref 32–48)
pCO2 arterial: 36.6 mmHg (ref 32–48)
pCO2 arterial: 38.9 mmHg (ref 32–48)
pCO2 arterial: 40.5 mmHg (ref 32–48)
pCO2 arterial: 43.7 mmHg (ref 32–48)
pCO2 arterial: 47.7 mmHg (ref 32–48)
pH, Arterial: 7.379 (ref 7.35–7.45)
pH, Arterial: 7.39 (ref 7.35–7.45)
pH, Arterial: 7.396 (ref 7.35–7.45)
pH, Arterial: 7.399 (ref 7.35–7.45)
pH, Arterial: 7.312 — ABNORMAL LOW (ref 7.35–7.45)
pH, Arterial: 7.356 (ref 7.35–7.45)
pH, Arterial: 7.337 — ABNORMAL LOW (ref 7.35–7.45)
pO2, Arterial: 500 mmHg — ABNORMAL HIGH (ref 83–108)
pO2, Arterial: 385 mmHg — ABNORMAL HIGH (ref 83–108)
pO2, Arterial: 256 mmHg — ABNORMAL HIGH (ref 83–108)
pO2, Arterial: 264 mmHg — ABNORMAL HIGH (ref 83–108)
pO2, Arterial: 144 mmHg — ABNORMAL HIGH (ref 83–108)
pO2, Arterial: 204 mmHg — ABNORMAL HIGH (ref 83–108)
pO2, Arterial: 174 mmHg — ABNORMAL HIGH (ref 83–108)

## 2023-03-02 LAB — GLUCOSE, CAPILLARY
Glucose-Capillary: 133 mg/dL — ABNORMAL HIGH (ref 70–99)
Glucose-Capillary: 136 mg/dL — ABNORMAL HIGH (ref 70–99)
Glucose-Capillary: 141 mg/dL — ABNORMAL HIGH (ref 70–99)
Glucose-Capillary: 142 mg/dL — ABNORMAL HIGH (ref 70–99)
Glucose-Capillary: 147 mg/dL — ABNORMAL HIGH (ref 70–99)
Glucose-Capillary: 154 mg/dL — ABNORMAL HIGH (ref 70–99)
Glucose-Capillary: 154 mg/dL — ABNORMAL HIGH (ref 70–99)
Glucose-Capillary: 161 mg/dL — ABNORMAL HIGH (ref 70–99)
Glucose-Capillary: 179 mg/dL — ABNORMAL HIGH (ref 70–99)
Glucose-Capillary: 84 mg/dL (ref 70–99)

## 2023-03-02 LAB — PREPARE RBC (CROSSMATCH)

## 2023-03-02 LAB — POCT I-STAT EG7
Acid-base deficit: 1 mmol/L (ref 0.0–2.0)
Bicarbonate: 23.5 mmol/L (ref 20.0–28.0)
Calcium, Ion: 1.04 mmol/L — ABNORMAL LOW (ref 1.15–1.40)
HCT: 19 % — ABNORMAL LOW (ref 36.0–46.0)
Hemoglobin: 6.5 g/dL — CL (ref 12.0–15.0)
O2 Saturation: 77 %
Potassium: 3.8 mmol/L (ref 3.5–5.1)
Sodium: 140 mmol/L (ref 135–145)
TCO2: 25 mmol/L (ref 22–32)
pCO2, Ven: 39.4 mmHg — ABNORMAL LOW (ref 44–60)
pH, Ven: 7.383 (ref 7.25–7.43)
pO2, Ven: 42 mmHg (ref 32–45)

## 2023-03-02 LAB — CBC
HCT: 27.2 % — ABNORMAL LOW (ref 36.0–46.0)
HCT: 19.8 % — ABNORMAL LOW (ref 36.0–46.0)
HCT: 19.7 % — ABNORMAL LOW (ref 36.0–46.0)
Hemoglobin: 8.7 g/dL — ABNORMAL LOW (ref 12.0–15.0)
Hemoglobin: 6.5 g/dL — CL (ref 12.0–15.0)
Hemoglobin: 6.3 g/dL — CL (ref 12.0–15.0)
MCH: 28.7 pg (ref 26.0–34.0)
MCH: 28.8 pg (ref 26.0–34.0)
MCH: 27.9 pg (ref 26.0–34.0)
MCHC: 32 g/dL (ref 30.0–36.0)
MCHC: 32.8 g/dL (ref 30.0–36.0)
MCHC: 32 g/dL (ref 30.0–36.0)
MCV: 89.8 fL (ref 80.0–100.0)
MCV: 87.6 fL (ref 80.0–100.0)
MCV: 87.2 fL (ref 80.0–100.0)
Platelets: 170 10*3/uL (ref 150–400)
Platelets: 140 10*3/uL — ABNORMAL LOW (ref 150–400)
Platelets: 142 10*3/uL — ABNORMAL LOW (ref 150–400)
RBC: 3.03 MIL/uL — ABNORMAL LOW (ref 3.87–5.11)
RBC: 2.26 MIL/uL — ABNORMAL LOW (ref 3.87–5.11)
RBC: 2.26 MIL/uL — ABNORMAL LOW (ref 3.87–5.11)
RDW: 15.4 % (ref 11.5–15.5)
RDW: 15.6 % — ABNORMAL HIGH (ref 11.5–15.5)
RDW: 15.6 % — ABNORMAL HIGH (ref 11.5–15.5)
WBC: 11.5 10*3/uL — ABNORMAL HIGH (ref 4.0–10.5)
WBC: 10.2 10*3/uL (ref 4.0–10.5)
WBC: 9.7 10*3/uL (ref 4.0–10.5)
nRBC: 0 % (ref 0.0–0.2)
nRBC: 0 % (ref 0.0–0.2)
nRBC: 0 % (ref 0.0–0.2)

## 2023-03-02 LAB — ECHO INTRAOPERATIVE TEE
Height: 63 in
Weight: 3088 oz

## 2023-03-02 LAB — PROTIME-INR
INR: 1.6 — ABNORMAL HIGH (ref 0.8–1.2)
INR: 1.5 — ABNORMAL HIGH (ref 0.8–1.2)
Prothrombin Time: 18.9 seconds — ABNORMAL HIGH (ref 11.4–15.2)
Prothrombin Time: 18.1 seconds — ABNORMAL HIGH (ref 11.4–15.2)

## 2023-03-02 LAB — HEMOGLOBIN AND HEMATOCRIT, BLOOD
HCT: 16.9 % — ABNORMAL LOW (ref 36.0–46.0)
Hemoglobin: 5.4 g/dL — CL (ref 12.0–15.0)

## 2023-03-02 LAB — MAGNESIUM: Magnesium: 2.8 mg/dL — ABNORMAL HIGH (ref 1.7–2.4)

## 2023-03-02 LAB — APTT: aPTT: 48 seconds — ABNORMAL HIGH (ref 24–36)

## 2023-03-02 LAB — PLATELET COUNT: Platelets: 160 10*3/uL (ref 150–400)

## 2023-03-02 LAB — FIBRINOGEN
Fibrinogen: 229 mg/dL (ref 210–475)
Fibrinogen: 278 mg/dL (ref 210–475)

## 2023-03-02 SURGERY — CORONARY ARTERY BYPASS GRAFTING (CABG)
Anesthesia: General | Site: Chest

## 2023-03-02 MED ORDER — METOPROLOL TARTRATE 12.5 MG HALF TABLET: 12.5000 mg | ORAL_TABLET | Freq: Two times a day (BID) | ORAL | Status: DC

## 2023-03-02 MED ORDER — FAMOTIDINE IN NACL 20-0.9 MG/50ML-% IV SOLN
20.0000 mg | Freq: Two times a day (BID) | INTRAVENOUS | Status: DC
  Administered 2023-03-02: 20 mg via INTRAVENOUS
  Filled 2023-03-02: qty 50

## 2023-03-02 MED ORDER — FENTANYL CITRATE (PF) 250 MCG/5ML IJ SOLN
INTRAMUSCULAR | Status: AC
  Filled 2023-03-02: qty 5

## 2023-03-02 MED ORDER — ASPIRIN 81 MG PO CHEW
324.0000 mg | CHEWABLE_TABLET | Freq: Every day | ORAL | Status: DC
  Administered 2023-03-09 – 2023-03-12 (×2): 324 mg
  Filled 2023-03-02 (×2): qty 4

## 2023-03-02 MED ORDER — ROCURONIUM BROMIDE 10 MG/ML (PF) SYRINGE
PREFILLED_SYRINGE | INTRAVENOUS | Status: AC
  Filled 2023-03-02: qty 20

## 2023-03-02 MED ORDER — IPRATROPIUM-ALBUTEROL 0.5-2.5 (3) MG/3ML IN SOLN
3.0000 mL | RESPIRATORY_TRACT | Status: DC | PRN
  Administered 2023-03-11 – 2023-03-12 (×2): 3 mL via RESPIRATORY_TRACT
  Filled 2023-03-02 (×2): qty 3

## 2023-03-02 MED ORDER — CHLORHEXIDINE GLUCONATE CLOTH 2 % EX PADS
6.0000 | MEDICATED_PAD | Freq: Every day | CUTANEOUS | Status: DC
  Administered 2023-03-03 – 2023-03-09 (×8): 6 via TOPICAL

## 2023-03-02 MED ORDER — ROCURONIUM BROMIDE 10 MG/ML (PF) SYRINGE
PREFILLED_SYRINGE | INTRAVENOUS | Status: DC | PRN
  Administered 2023-03-02: 100 mg via INTRAVENOUS
  Administered 2023-03-02: 50 mg via INTRAVENOUS
  Administered 2023-03-02: 30 mg via INTRAVENOUS

## 2023-03-02 MED ORDER — BISACODYL 5 MG PO TBEC
10.0000 mg | DELAYED_RELEASE_TABLET | Freq: Every day | ORAL | Status: DC
  Administered 2023-03-03 – 2023-03-05 (×3): 10 mg via ORAL
  Filled 2023-03-02 (×4): qty 2

## 2023-03-02 MED ORDER — BISACODYL 10 MG RE SUPP: 10.0000 mg | Freq: Every day | RECTAL | Status: DC

## 2023-03-02 MED ORDER — SODIUM CHLORIDE 0.9% IV SOLUTION: Freq: Once | INTRAVENOUS | Status: AC

## 2023-03-02 MED ORDER — POTASSIUM CHLORIDE 10 MEQ/50ML IV SOLN: 10.0000 meq | INTRAVENOUS | Status: AC

## 2023-03-02 MED ORDER — PANTOPRAZOLE SODIUM 40 MG PO TBEC: 40.0000 mg | DELAYED_RELEASE_TABLET | Freq: Every day | ORAL | Status: DC

## 2023-03-02 MED ORDER — METOPROLOL TARTRATE 12.5 MG HALF TABLET
12.5000 mg | ORAL_TABLET | Freq: Once | ORAL | Status: DC
  Filled 2023-03-02: qty 1

## 2023-03-02 MED ORDER — PAPAVERINE HCL 30 MG/ML IJ SOLN
INTRAMUSCULAR | Status: AC
  Filled 2023-03-02: qty 2

## 2023-03-02 MED ORDER — ONDANSETRON HCL 4 MG/2ML IJ SOLN
4.0000 mg | Freq: Four times a day (QID) | INTRAMUSCULAR | Status: DC | PRN
  Administered 2023-03-03 – 2023-03-05 (×4): 4 mg via INTRAVENOUS
  Filled 2023-03-02 (×4): qty 2

## 2023-03-02 MED ORDER — INSULIN REGULAR(HUMAN) IN NACL 100-0.9 UT/100ML-% IV SOLN
INTRAVENOUS | Status: DC
  Administered 2023-03-03 (×2): 2.2 [IU]/h via INTRAVENOUS
  Filled 2023-03-02: qty 100

## 2023-03-02 MED ORDER — LACTATED RINGERS IV SOLN: INTRAVENOUS | Status: DC

## 2023-03-02 MED ORDER — ONDANSETRON HCL 4 MG/2ML IJ SOLN
INTRAMUSCULAR | Status: DC | PRN
  Administered 2023-03-02: 4 mg via INTRAVENOUS

## 2023-03-02 MED ORDER — METOPROLOL TARTRATE 25 MG/10 ML ORAL SUSPENSION: 12.5000 mg | Freq: Two times a day (BID) | ORAL | Status: DC

## 2023-03-02 MED ORDER — ONDANSETRON HCL 4 MG/2ML IJ SOLN
INTRAMUSCULAR | Status: AC
  Filled 2023-03-02: qty 2

## 2023-03-02 MED ORDER — PHENYLEPHRINE 80 MCG/ML (10ML) SYRINGE FOR IV PUSH (FOR BLOOD PRESSURE SUPPORT)
PREFILLED_SYRINGE | INTRAVENOUS | Status: DC | PRN
  Administered 2023-03-02: 160 ug via INTRAVENOUS

## 2023-03-02 MED ORDER — ORAL CARE MOUTH RINSE: 15.0000 mL | Freq: Once | OROMUCOSAL | Status: AC

## 2023-03-02 MED ORDER — ACETAMINOPHEN 160 MG/5ML PO SOLN: 650.0000 mg | Freq: Once | ORAL | Status: AC

## 2023-03-02 MED ORDER — TRAMADOL HCL 50 MG PO TABS
50.0000 mg | ORAL_TABLET | ORAL | Status: DC | PRN
  Administered 2023-03-04: 100 mg via ORAL
  Filled 2023-03-02: qty 2

## 2023-03-02 MED ORDER — DOCUSATE SODIUM 100 MG PO CAPS
200.0000 mg | ORAL_CAPSULE | Freq: Every day | ORAL | Status: DC
  Administered 2023-03-03 – 2023-03-15 (×11): 200 mg via ORAL
  Filled 2023-03-02 (×11): qty 2

## 2023-03-02 MED ORDER — NITROGLYCERIN IN D5W 200-5 MCG/ML-% IV SOLN: 0.0000 ug/min | INTRAVENOUS | Status: DC

## 2023-03-02 MED ORDER — SODIUM CHLORIDE 0.9% FLUSH
3.0000 mL | Freq: Two times a day (BID) | INTRAVENOUS | Status: DC
  Administered 2023-03-03 – 2023-03-04 (×4): 3 mL via INTRAVENOUS

## 2023-03-02 MED ORDER — MAGNESIUM SULFATE 4 GM/100ML IV SOLN
4.0000 g | Freq: Once | INTRAVENOUS | Status: AC
  Administered 2023-03-02: 4 g via INTRAVENOUS
  Filled 2023-03-02: qty 100

## 2023-03-02 MED ORDER — SODIUM CHLORIDE 0.9 % IV SOLN
20.0000 ug | Freq: Once | INTRAVENOUS | Status: AC
  Administered 2023-03-02: 20 ug via INTRAVENOUS
  Filled 2023-03-02: qty 5

## 2023-03-02 MED ORDER — VASOPRESSIN 20 UNITS/100 ML INFUSION FOR SHOCK: 0.0000 [IU]/min | INTRAVENOUS | Status: DC

## 2023-03-02 MED ORDER — DEXTROSE 50 % IV SOLN: 0.0000 mL | INTRAVENOUS | Status: DC | PRN

## 2023-03-02 MED ORDER — HEPARIN SODIUM (PORCINE) 1000 UNIT/ML IJ SOLN
INTRAMUSCULAR | Status: AC
  Filled 2023-03-02: qty 1

## 2023-03-02 MED ORDER — ORAL CARE MOUTH RINSE: 15.0000 mL | OROMUCOSAL | Status: DC | PRN

## 2023-03-02 MED ORDER — PANTOPRAZOLE SODIUM 40 MG IV SOLR
40.0000 mg | INTRAVENOUS | Status: DC
  Administered 2023-03-02: 40 mg via INTRAVENOUS
  Filled 2023-03-02: qty 10

## 2023-03-02 MED ORDER — PHENYLEPHRINE 80 MCG/ML (10ML) SYRINGE FOR IV PUSH (FOR BLOOD PRESSURE SUPPORT)
PREFILLED_SYRINGE | INTRAVENOUS | Status: AC
  Filled 2023-03-02: qty 10

## 2023-03-02 MED ORDER — CALCIUM CHLORIDE 10 % IV SOLN
1.0000 g | Freq: Once | INTRAVENOUS | Status: AC
  Administered 2023-03-02: 1 g via INTRAVENOUS

## 2023-03-02 MED ORDER — LACTATED RINGERS IV SOLN: INTRAVENOUS | Status: DC | PRN

## 2023-03-02 MED ORDER — ACETAMINOPHEN 500 MG PO TABS
1000.0000 mg | ORAL_TABLET | Freq: Four times a day (QID) | ORAL | Status: AC
  Administered 2023-03-03 – 2023-03-07 (×15): 1000 mg via ORAL
  Filled 2023-03-02 (×16): qty 2

## 2023-03-02 MED ORDER — SODIUM CHLORIDE 0.9 % IV SOLN: 250.0000 mL | INTRAVENOUS | Status: DC

## 2023-03-02 MED ORDER — ALBUTEROL SULFATE (2.5 MG/3ML) 0.083% IN NEBU: 2.5000 mg | INHALATION_SOLUTION | RESPIRATORY_TRACT | Status: DC | PRN

## 2023-03-02 MED ORDER — UMECLIDINIUM BROMIDE 62.5 MCG/ACT IN AEPB
1.0000 | INHALATION_SPRAY | Freq: Every day | RESPIRATORY_TRACT | Status: DC
  Filled 2023-03-02: qty 7

## 2023-03-02 MED ORDER — PROTAMINE SULFATE 10 MG/ML IV SOLN
INTRAVENOUS | Status: AC
  Filled 2023-03-02: qty 5

## 2023-03-02 MED ORDER — PROTAMINE SULFATE 10 MG/ML IV SOLN
INTRAVENOUS | Status: AC
  Filled 2023-03-02: qty 25

## 2023-03-02 MED ORDER — CHLORHEXIDINE GLUCONATE 0.12 % MT SOLN
15.0000 mL | Freq: Once | OROMUCOSAL | Status: AC
  Administered 2023-03-02: 15 mL via OROMUCOSAL
  Filled 2023-03-02: qty 15

## 2023-03-02 MED ORDER — PROPOFOL 10 MG/ML IV BOLUS
INTRAVENOUS | Status: DC | PRN
  Administered 2023-03-02: 50 mg via INTRAVENOUS

## 2023-03-02 MED ORDER — LACTATED RINGERS IV SOLN
500.0000 mL | Freq: Once | INTRAVENOUS | Status: AC | PRN
  Administered 2023-03-02: 500 mL via INTRAVENOUS

## 2023-03-02 MED ORDER — VANCOMYCIN HCL 1000 MG IV SOLR
INTRAVENOUS | Status: DC | PRN
  Administered 2023-03-02: 1000 mL

## 2023-03-02 MED ORDER — SODIUM CHLORIDE 0.9% FLUSH: 3.0000 mL | INTRAVENOUS | Status: DC | PRN

## 2023-03-02 MED ORDER — ~~LOC~~ CARDIAC SURGERY, PATIENT & FAMILY EDUCATION
Freq: Once | Status: DC
  Filled 2023-03-02: qty 1

## 2023-03-02 MED ORDER — CHLORHEXIDINE GLUCONATE 4 % EX LIQD: 30.0000 mL | CUTANEOUS | Status: DC

## 2023-03-02 MED ORDER — FENTANYL CITRATE (PF) 250 MCG/5ML IJ SOLN
INTRAMUSCULAR | Status: DC | PRN
  Administered 2023-03-02: 100 ug via INTRAVENOUS
  Administered 2023-03-02: 150 ug via INTRAVENOUS
  Administered 2023-03-02: 50 ug via INTRAVENOUS
  Administered 2023-03-02: 150 ug via INTRAVENOUS
  Administered 2023-03-02 (×2): 100 ug via INTRAVENOUS
  Administered 2023-03-02: 150 ug via INTRAVENOUS
  Administered 2023-03-02: 100 ug via INTRAVENOUS
  Administered 2023-03-02: 150 ug via INTRAVENOUS
  Administered 2023-03-02: 200 ug via INTRAVENOUS

## 2023-03-02 MED ORDER — METOPROLOL TARTRATE 5 MG/5ML IV SOLN
2.5000 mg | INTRAVENOUS | Status: DC | PRN
  Administered 2023-03-06 – 2023-03-07 (×3): 5 mg via INTRAVENOUS
  Administered 2023-03-08 (×2): 2.5 mg via INTRAVENOUS
  Administered 2023-03-08: 5 mg via INTRAVENOUS
  Filled 2023-03-02 (×6): qty 5

## 2023-03-02 MED ORDER — ORAL CARE MOUTH RINSE
15.0000 mL | OROMUCOSAL | Status: DC
  Administered 2023-03-02 – 2023-03-03 (×5): 15 mL via OROMUCOSAL

## 2023-03-02 MED ORDER — ALBUTEROL SULFATE HFA 108 (90 BASE) MCG/ACT IN AERS: 2.0000 | INHALATION_SPRAY | RESPIRATORY_TRACT | Status: DC | PRN

## 2023-03-02 MED ORDER — CEFAZOLIN SODIUM-DEXTROSE 2-4 GM/100ML-% IV SOLN
2.0000 g | Freq: Three times a day (TID) | INTRAVENOUS | Status: AC
  Administered 2023-03-02 – 2023-03-04 (×6): 2 g via INTRAVENOUS
  Filled 2023-03-02 (×4): qty 100

## 2023-03-02 MED ORDER — SODIUM CHLORIDE 0.9 % IV SOLN: INTRAVENOUS | Status: DC | PRN

## 2023-03-02 MED ORDER — PROTAMINE SULFATE 10 MG/ML IV SOLN
25.0000 mg | Freq: Once | INTRAVENOUS | Status: AC
  Administered 2023-03-02: 25 mg via INTRAVENOUS
  Filled 2023-03-02: qty 5

## 2023-03-02 MED ORDER — CHLORHEXIDINE GLUCONATE 0.12 % MT SOLN
15.0000 mL | OROMUCOSAL | Status: AC
  Administered 2023-03-02: 15 mL via OROMUCOSAL
  Filled 2023-03-02: qty 15

## 2023-03-02 MED ORDER — 0.9 % SODIUM CHLORIDE (POUR BTL) OPTIME
TOPICAL | Status: DC | PRN
  Administered 2023-03-02: 5000 mL

## 2023-03-02 MED ORDER — DEXMEDETOMIDINE HCL IN NACL 400 MCG/100ML IV SOLN
0.0000 ug/kg/h | INTRAVENOUS | Status: DC
  Administered 2023-03-03: 0.3 ug/kg/h via INTRAVENOUS
  Filled 2023-03-02 (×2): qty 100

## 2023-03-02 MED ORDER — SODIUM CHLORIDE 0.9 % IV SOLN: INTRAVENOUS | Status: DC

## 2023-03-02 MED ORDER — SERTRALINE HCL 25 MG PO TABS
25.0000 mg | ORAL_TABLET | Freq: Every day | ORAL | Status: DC
  Administered 2023-03-03 – 2023-03-15 (×13): 25 mg via ORAL
  Filled 2023-03-02 (×13): qty 1

## 2023-03-02 MED ORDER — CHLORHEXIDINE GLUCONATE 0.12 % MT SOLN: 15.0000 mL | Freq: Once | OROMUCOSAL | Status: DC

## 2023-03-02 MED ORDER — ACETAMINOPHEN 650 MG RE SUPP
650.0000 mg | Freq: Once | RECTAL | Status: AC
  Administered 2023-03-02: 650 mg via RECTAL

## 2023-03-02 MED ORDER — TIOTROPIUM BROMIDE MONOHYDRATE 18 MCG IN CAPS: 18.0000 ug | ORAL_CAPSULE | Freq: Every day | RESPIRATORY_TRACT | Status: DC

## 2023-03-02 MED ORDER — VANCOMYCIN HCL IN DEXTROSE 1-5 GM/200ML-% IV SOLN
1000.0000 mg | Freq: Once | INTRAVENOUS | Status: AC
  Administered 2023-03-02: 1000 mg via INTRAVENOUS
  Filled 2023-03-02: qty 200

## 2023-03-02 MED ORDER — PROPOFOL 1000 MG/100ML IV EMUL
INTRAVENOUS | Status: AC
  Filled 2023-03-02: qty 100

## 2023-03-02 MED ORDER — PHENYLEPHRINE HCL-NACL 20-0.9 MG/250ML-% IV SOLN
0.0000 ug/min | INTRAVENOUS | Status: DC
  Administered 2023-03-02 – 2023-03-03 (×2): 50 ug/min via INTRAVENOUS
  Administered 2023-03-03: 20 ug/min via INTRAVENOUS
  Filled 2023-03-02 (×3): qty 250

## 2023-03-02 MED ORDER — VASOPRESSIN 20 UNIT/ML IV SOLN
INTRAVENOUS | Status: AC
  Filled 2023-03-02: qty 1

## 2023-03-02 MED ORDER — SODIUM CHLORIDE 0.9% IV SOLUTION: Freq: Once | INTRAVENOUS | Status: DC

## 2023-03-02 MED ORDER — PROPOFOL 10 MG/ML IV BOLUS
INTRAVENOUS | Status: AC
  Filled 2023-03-02: qty 20

## 2023-03-02 MED ORDER — SODIUM CHLORIDE 0.45 % IV SOLN: INTRAVENOUS | Status: DC | PRN

## 2023-03-02 MED ORDER — PROTAMINE SULFATE 10 MG/ML IV SOLN
INTRAVENOUS | Status: DC | PRN
  Administered 2023-03-02: 310 mg via INTRAVENOUS

## 2023-03-02 MED ORDER — SODIUM BICARBONATE 8.4 % IV SOLN
100.0000 meq | Freq: Once | INTRAVENOUS | Status: AC
  Administered 2023-03-02: 100 meq via INTRAVENOUS

## 2023-03-02 MED ORDER — PLASMA-LYTE A IV SOLN
INTRAVENOUS | Status: DC | PRN
  Administered 2023-03-02: 500 mL

## 2023-03-02 MED ORDER — MIDAZOLAM HCL 2 MG/2ML IJ SOLN: 2.0000 mg | INTRAMUSCULAR | Status: DC | PRN

## 2023-03-02 MED ORDER — ALBUMIN HUMAN 5 % IV SOLN
250.0000 mL | INTRAVENOUS | Status: AC | PRN
  Administered 2023-03-02 (×4): 12.5 g via INTRAVENOUS
  Filled 2023-03-02: qty 250

## 2023-03-02 MED ORDER — ACETAMINOPHEN 160 MG/5ML PO SOLN
1000.0000 mg | Freq: Four times a day (QID) | ORAL | Status: AC
  Administered 2023-03-03 (×2): 1000 mg
  Filled 2023-03-02: qty 40.6

## 2023-03-02 MED ORDER — MORPHINE SULFATE (PF) 2 MG/ML IV SOLN
1.0000 mg | INTRAVENOUS | Status: DC | PRN
  Administered 2023-03-02: 4 mg via INTRAVENOUS
  Filled 2023-03-02: qty 2

## 2023-03-02 MED ORDER — ASPIRIN 325 MG PO TBEC
325.0000 mg | DELAYED_RELEASE_TABLET | Freq: Every day | ORAL | Status: DC
  Administered 2023-03-03 – 2023-03-15 (×11): 325 mg via ORAL
  Filled 2023-03-02 (×13): qty 1

## 2023-03-02 MED ORDER — HEPARIN SODIUM (PORCINE) 1000 UNIT/ML IJ SOLN
INTRAMUSCULAR | Status: DC | PRN
  Administered 2023-03-02: 31000 [IU] via INTRAVENOUS

## 2023-03-02 MED ORDER — PAPAVERINE HCL 30 MG/ML IJ SOLN
INTRAMUSCULAR | Status: DC | PRN
  Administered 2023-03-02: 60 mg via INTRAVENOUS

## 2023-03-02 MED ORDER — OXYCODONE HCL 5 MG PO TABS
5.0000 mg | ORAL_TABLET | ORAL | Status: DC | PRN
  Administered 2023-03-03 – 2023-03-11 (×4): 10 mg via ORAL
  Filled 2023-03-02 (×4): qty 2

## 2023-03-02 MED ORDER — MIDAZOLAM HCL (PF) 5 MG/ML IJ SOLN
INTRAMUSCULAR | Status: DC | PRN
  Administered 2023-03-02 (×2): 2 mg via INTRAVENOUS
  Administered 2023-03-02: 4 mg via INTRAVENOUS
  Administered 2023-03-02: 2 mg via INTRAVENOUS

## 2023-03-02 MED ORDER — ASPIRIN 325 MG PO TABS: 325.0000 mg | ORAL_TABLET | Freq: Once | ORAL | Status: DC

## 2023-03-02 MED ORDER — MIDAZOLAM HCL (PF) 10 MG/2ML IJ SOLN
INTRAMUSCULAR | Status: AC
  Filled 2023-03-02: qty 2

## 2023-03-02 SURGICAL SUPPLY — 128 items
ADAPTER CARDIO PERF ANTE/RETRO (ADAPTER) ×2 IMPLANT
ADH SKN CLS APL DERMABOND .7 (GAUZE/BANDAGES/DRESSINGS) ×2
ADPR CRDPLG 7.5 .25D 1 LRG Y (ADAPTER) ×2
ADPR PRFSN 84XANTGRD RTRGD (ADAPTER) ×2
BAG DECANTER FOR FLEXI CONT (MISCELLANEOUS) ×2 IMPLANT
BLADE CLIPPER SURG (BLADE) ×2 IMPLANT
BLADE MICRO SHARP 3 15 DEG (BLADE) ×2 IMPLANT
BLADE STERNUM SYSTEM 6 (BLADE) ×2 IMPLANT
BLADE SURG 11 STRL SS (BLADE) IMPLANT
BLADE SURG 15 STRL LF DISP TIS (BLADE) IMPLANT
BLADE SURG 15 STRL SS (BLADE) ×4
BNDG ELASTIC 4X5.8 VLCR STR LF (GAUZE/BANDAGES/DRESSINGS) ×2 IMPLANT
BNDG ELASTIC 6X5.8 VLCR STR LF (GAUZE/BANDAGES/DRESSINGS) ×2 IMPLANT
BNDG GAUZE DERMACEA FLUFF 4 (GAUZE/BANDAGES/DRESSINGS) ×2 IMPLANT
BNDG GZE DERMACEA 4 6PLY (GAUZE/BANDAGES/DRESSINGS) ×2
BOOT SUTURE VASCULAR YLW (MISCELLANEOUS) ×2
CANISTER SUCT 3000ML PPV (MISCELLANEOUS) ×2 IMPLANT
CANNULA MC2 2 STG 36/46 NON-V (CANNULA) IMPLANT
CANNULA NON VENT 20FR 12 (CANNULA) ×2 IMPLANT
CANNULA NON VENT 22FR 12 (CANNULA) ×2 IMPLANT
CANNULA VENOUS 2 STG 34/46 (CANNULA) ×2
CATH HEART VENT LEFT (CATHETERS) ×2 IMPLANT
CATH RETROPLEGIA CORONARY 14FR (CATHETERS) ×2 IMPLANT
CATH ROBINSON RED A/P 18FR (CATHETERS) ×6 IMPLANT
CATH THOR STR 32F SOFT 20 RADI (CATHETERS) ×4 IMPLANT
CATH THORACIC 28FR RT ANG (CATHETERS) ×2 IMPLANT
CLAMP SUTURE YELLOW 5 PAIRS (MISCELLANEOUS) ×2 IMPLANT
CLIP TI LARGE 6 (CLIP) ×2 IMPLANT
CLIP TI MEDIUM 24 (CLIP) IMPLANT
CLIP TI WIDE RED SMALL 24 (CLIP) IMPLANT
CNTNR URN SCR LID CUP LEK RST (MISCELLANEOUS) IMPLANT
CONN Y 3/8X3/8X3/8  BEN (MISCELLANEOUS) ×4
CONN Y 3/8X3/8X3/8 BEN (MISCELLANEOUS) IMPLANT
CONNECTOR STRAIGHT 3/8 (MISCELLANEOUS) IMPLANT
CONT SPEC 4OZ STRL OR WHT (MISCELLANEOUS) ×6
CONTAINER PROTECT SURGISLUSH (MISCELLANEOUS) ×6 IMPLANT
COUNTER NDL 20CT MAGNET RED (NEEDLE) IMPLANT
DERMABOND ADVANCED .7 DNX12 (GAUZE/BANDAGES/DRESSINGS) IMPLANT
DEV SUMP PERICARDIAL 20F 15IN (MISCELLANEOUS) IMPLANT
DEVICE SUT CK QUICK LOAD MINI (Prosthesis & Implant Heart) IMPLANT
DRAIN JP 10F RND RADIO (DRAIN) IMPLANT
DRAPE CV SPLIT W-CLR ANES SCRN (DRAPES) ×2 IMPLANT
DRAPE INCISE IOBAN 66X45 STRL (DRAPES) ×2 IMPLANT
DRAPE PERI GROIN 82X75IN TIB (DRAPES) ×2 IMPLANT
DRAPE SLUSH/WARMER DISC (DRAPES) IMPLANT
DRAPE WARM FLUID 44X44 (DRAPES) ×2 IMPLANT
DRESSING AQUACEL AG ADV 3.5X12 (MISCELLANEOUS) ×2 IMPLANT
DRSG AQUACEL AG ADV 3.5X10 (GAUZE/BANDAGES/DRESSINGS) ×2 IMPLANT
DRSG AQUACEL AG ADV 3.5X12 (MISCELLANEOUS)
ELECT BLADE 4.0 EZ CLEAN MEGAD (MISCELLANEOUS) ×2
ELECT CAUTERY BLADE 6.4 (BLADE) ×2 IMPLANT
ELECT REM PT RETURN 9FT ADLT (ELECTROSURGICAL) ×4
ELECTRODE BLDE 4.0 EZ CLN MEGD (MISCELLANEOUS) ×2 IMPLANT
ELECTRODE REM PT RTRN 9FT ADLT (ELECTROSURGICAL) ×4 IMPLANT
EVACUATOR SILICONE 100CC (DRAIN) IMPLANT
FELT TEFLON 1X6 (MISCELLANEOUS) ×4 IMPLANT
GAUZE SPONGE 4X4 12PLY STRL (GAUZE/BANDAGES/DRESSINGS) ×4 IMPLANT
GLOVE BIOGEL PI IND STRL 6 (GLOVE) IMPLANT
GLOVE BIOGEL PI IND STRL 6.5 (GLOVE) IMPLANT
GLOVE BIOGEL PI IND STRL 7.0 (GLOVE) IMPLANT
GLOVE BIOGEL PI IND STRL 7.5 (GLOVE) IMPLANT
GLOVE ECLIPSE 7.5 STRL STRAW (GLOVE) IMPLANT
GLOVE SS BIOGEL STRL SZ 6 (GLOVE) IMPLANT
GLOVE SURG SS PI 6.5 STRL IVOR (GLOVE) IMPLANT
GOWN STRL REUS W/ TWL LRG LVL3 (GOWN DISPOSABLE) ×12 IMPLANT
GOWN STRL REUS W/ TWL XL LVL3 (GOWN DISPOSABLE) IMPLANT
GOWN STRL REUS W/TWL LRG LVL3 (GOWN DISPOSABLE) ×12
GOWN STRL REUS W/TWL XL LVL3 (GOWN DISPOSABLE) ×10
HEMOSTAT SURGICEL 2X14 (HEMOSTASIS) IMPLANT
INSERT FOGARTY 61MM (MISCELLANEOUS) IMPLANT
INSERT FOGARTY XLG (MISCELLANEOUS) ×2 IMPLANT
KIT BASIN OR (CUSTOM PROCEDURE TRAY) ×2 IMPLANT
KIT CATH CPB BARTLE (MISCELLANEOUS) ×2 IMPLANT
KIT SUCTION CATH 14FR (SUCTIONS) ×2 IMPLANT
KIT SUT CK MINI COMBO 4X17 (Prosthesis & Implant Heart) IMPLANT
KIT TURNOVER KIT B (KITS) ×2 IMPLANT
KIT VASOVIEW HEMOPRO 2 VH 4000 (KITS) ×2 IMPLANT
LINE VENT (MISCELLANEOUS) IMPLANT
MARKER DISTAL GRAFT W/ HOLDER (MISCELLANEOUS) IMPLANT
NS IRRIG 1000ML POUR BTL (IV SOLUTION) ×10 IMPLANT
ORGANIZER SUTURE GABBAY-FRATER (MISCELLANEOUS) ×2 IMPLANT
PACK E OPEN HEART (SUTURE) ×2 IMPLANT
PACK OPEN HEART (CUSTOM PROCEDURE TRAY) ×2 IMPLANT
PAD ARMBOARD 7.5X6 YLW CONV (MISCELLANEOUS) ×4 IMPLANT
PAD ELECT DEFIB RADIOL ZOLL (MISCELLANEOUS) ×2 IMPLANT
PENCIL BUTTON HOLSTER BLD 10FT (ELECTRODE) ×2 IMPLANT
POSITIONER HEAD DONUT 9IN (MISCELLANEOUS) ×2 IMPLANT
PUNCH AORTIC ROTATE 4.0MM (MISCELLANEOUS) ×2 IMPLANT
PUNCH AORTIC ROTATE 4.5MM 8IN (MISCELLANEOUS) ×2 IMPLANT
SET MPS 3-ND DEL (MISCELLANEOUS) IMPLANT
SET Y-VENT ADPTR 7.5 MALE LUER (ADAPTER) IMPLANT
SOL PREP POV-IOD 4OZ 10% (MISCELLANEOUS) IMPLANT
SOL SCRUB PVP POV-IOD 4OZ 7.5% (MISCELLANEOUS) ×4
SOLUTION SCRB POV-IOD 4OZ 7.5% (MISCELLANEOUS) IMPLANT
SPONGE T-LAP 18X18 ~~LOC~~+RFID (SPONGE) IMPLANT
STOPCOCK 4 WAY LG BORE MALE ST (IV SETS) IMPLANT
SUPPORT HEART JANKE-BARRON (MISCELLANEOUS) ×2 IMPLANT
SUT BONE WAX W31G (SUTURE) ×2 IMPLANT
SUT EB EXC GRN/WHT 2-0 V-5 (SUTURE) ×4 IMPLANT
SUT ETHIBOND 2-0 30 1/2 V-5 (SUTURE) IMPLANT
SUT MNCRL AB 4-0 PS2 18 (SUTURE) ×4 IMPLANT
SUT PERMA SILK 0 CT1 (SUTURE) IMPLANT
SUT PROLENE 4 0 RB 1 (SUTURE) ×6
SUT PROLENE 4 0 SH DA (SUTURE) ×2 IMPLANT
SUT PROLENE 4-0 RB1 .5 CRCL 36 (SUTURE) ×2 IMPLANT
SUT PROLENE 5 0 C 1 36 (SUTURE) IMPLANT
SUT PROLENE 6 0 C 1 30 (SUTURE) IMPLANT
SUT PROLENE 7 0 BV1 MDA (SUTURE) ×2 IMPLANT
SUT STEEL 6MS V (SUTURE) IMPLANT
SUT STEEL SZ 6 DBL 3X14 BALL (SUTURE) ×4 IMPLANT
SUT VIC AB 0 CTX 36 (SUTURE) ×4
SUT VIC AB 0 CTX36XBRD ANTBCTR (SUTURE) ×4 IMPLANT
SUT VIC AB 2-0 CT1 27 (SUTURE) ×8
SUT VIC AB 2-0 CT1 TAPERPNT 27 (SUTURE) ×4 IMPLANT
SYR 20CC LL (SYRINGE) IMPLANT
SYSTEM SAHARA CHEST DRAIN ATS (WOUND CARE) ×2 IMPLANT
TAG SUTURE CLAMP YLW 5PR (MISCELLANEOUS) ×2
TAPE CLOTH SURG 4X10 WHT LF (GAUZE/BANDAGES/DRESSINGS) IMPLANT
TAPE PAPER 2X10 WHT MICROPORE (GAUZE/BANDAGES/DRESSINGS) IMPLANT
TOWEL GREEN STERILE (TOWEL DISPOSABLE) ×2 IMPLANT
TOWEL GREEN STERILE FF (TOWEL DISPOSABLE) ×2 IMPLANT
TRAY FOLEY SLVR 16FR TEMP STAT (SET/KITS/TRAYS/PACK) ×2 IMPLANT
TUBING ART PRESS 48 MALE/FEM (TUBING) IMPLANT
TUBING LAP HI FLOW INSUFFLATIO (TUBING) ×2 IMPLANT
UNDERPAD 30X36 HEAVY ABSORB (UNDERPADS AND DIAPERS) ×2 IMPLANT
VALVE AORTIC SZ23 INSP/RESIL (Prosthesis & Implant Heart) IMPLANT
VENT LEFT HEART 12002 (CATHETERS) ×2
WATER STERILE IRR 1000ML POUR (IV SOLUTION) ×4 IMPLANT

## 2023-03-02 NOTE — Progress Notes (Signed)
Agarwala MD and Lavonna Monarch MD at bedside and made aware of all critical values on iSTAT labs (hemoglobin).

## 2023-03-02 NOTE — Progress Notes (Signed)
Patient's CBG 84 @ 0656.  Unable to reach Dr. Lissa Hoard but did report to Sharrie Rothman, CRNA.  Protocol not started, Sharrie Rothman stated patient will be on the endotool and checked hourly in surgery.

## 2023-03-02 NOTE — Progress Notes (Signed)
Date and time results received: 03/02/23 1607 Test: hemoglobin Critical Value: 6.5 Name of Provider Notified: Agarwala MD Orders Received? Or Actions Taken?: blood administration - see orders

## 2023-03-02 NOTE — Anesthesia Procedure Notes (Signed)
Central Venous Catheter Insertion Performed by: Nolon Nations, MD, anesthesiologist Start/End3/05/2023 8:00 AM, 03/02/2023 8:17 AM Patient location: Pre-op. Preanesthetic checklist: patient identified, IV checked, site marked, risks and benefits discussed, surgical consent, monitors and equipment checked, pre-op evaluation, timeout performed and anesthesia consent Position: Trendelenburg Lidocaine 1% used for infiltration and patient sedated Hand hygiene performed  and maximum sterile barriers used  Catheter size: 9 Fr Sheath introducer Procedure performed using ultrasound guided technique. Ultrasound Notes:anatomy identified, needle tip was noted to be adjacent to the nerve/plexus identified, no ultrasound evidence of intravascular and/or intraneural injection and image(s) printed for medical record Attempts: 1 Following insertion, line sutured and dressing applied. Post procedure assessment: blood return through all ports, free fluid flow and no air  Patient tolerated the procedure well with no immediate complications.

## 2023-03-02 NOTE — Op Note (Signed)
CARDIOVASCULAR SURGERY OPERATIVE NOTE  03/02/2023 Joyce Potter [id number]  Surgeon:  Everlena Cooper, MD  First Assistant: Wynelle Beckmann [facility]                               An experienced assistant was required given the complexity of this surgery and the standard of surgical care. The assistant was needed for exposure, dissection, suctioning, retraction of delicate tissues and sutures, instrument exchange and for overall help during this procedure.     Preoperative Diagnosis:  Aortic Stenosis                                             Coronary artery disease  Postoperative Diagnosis:  Same   Procedure: Aortic Valve Replacement with a 74m Inspiris Pericardial valve (SN [id number]                      Coronary artery bypass x 2  with the LIMA to the LAD and RSVG from the aorta to the Diagonal coronary artery    Anesthesia:  General Endotracheal   Clinical History/Surgical Indication:   77yo female with NYHA class 3 symptoms of severe AS and CAD. Pt at slightly higher risk for surgery secondary to her PVD and previous retinal occlusion and her iron issues/anemia but I feel should do well with SAVR/CABG.   Findings:  The heart was located completely in the left chest and was very hypertrophic and difficult to get into the field of view for grafting the LAD and Diag. Each vessel was 1.5 mm and with mild disease on the LAD and moderate disease on the DIAG  The aortic valve was trileaflet and moderately calcified. At the conclusion of the procedure the pt had a well seated AVR and with a mean gradient of 560mg, normal LV function and in NSR.    Media Information      Preparation:  The patient was seen in the preoperative holding area and the correct patient, correct operation were confirmed with the patient after reviewing the medical record and catheterization. The consent was signed by me. Preoperative antibiotics were given. A pulmonary arterial line and radial arterial line  were placed by the anesthesia team. The patient was taken back to the operating room and positioned supine on the operating room table. After being placed under general endotracheal anesthesia by the anesthesia team a foley catheter was placed. The neck, chest, abdomen, and both legs were prepped with betadine soap and solution and draped in the usual sterile manner. A surgical time-out was taken and the correct patient and operative procedure were confirmed with the nursing and anesthesia staff.  Operation: A median sternotomy incision was created and sternal the sternal saw.  Simultaneously from the lower extremity on the right saphenous vein was harvested with the endovascular technique this wound was closed in multiple layers absorbable suture this was performed by the physician assistant. Left internal mammary artery was harvested and packed in apparent soaked sponge and heparin was delivered. Pericardial well was developed and the aortic cannulated with 22 arterial cannula and a two-stage cannulas placed in the right atrium for venous return.  With adequate confirmation of anticoagulation cardiopulmonary bypass was instituted.  An antegrade cardioplegia catheter was placed in ascending aorta and a left ventricular vent placed  in the right superior pulmonary vein. With adequate bypass the aortic cross-clamp was placed and 1300 cc of [place] blood cardioplegia was delivered antegrade.  The reanimation dose was delivered just prior to cross-clamp removal. The piece of reverse saphenous vein graft was then anastomosed to the diagonal in an end-to-side fashion utilizing 8 Prolene suture.  It was flushed de-aired and found to be hemostatic.  The mammary artery was then anastomosed to the LAD in an end-to-side fashion using 8-0 Prolene sutures.  Was flushed de-aired and found hemostatic and the pedicle pexed the anterior surface of the heart with 5-0 Prolene suture.  Again it should be noted that it was  quite difficult to get the anterior wall of the heart within the plane of view secondary to the patient's extreme hypertrophied ventricle and it lying in the left chest. An aortotomy was then performed and the aortic valve was identified photographed resected and the annulus decalcified.  The left ventricular cavity was copiously irrigated with a saline solution.  Utilizing sixteen 2-0 [place] pledgeted sutures with the pledgets on the ventricular side a 23 mm pericardial prosthesis was then secured photographed in the aorta closed with a running pledgeted 4-0 Prolene suture. The saphenous vein graft was then brought off through a 4.0 mm punch and a vein graft marker was utilized and this was performed with 6-0 Prolene suture.  The patient in headdown position aortic cross-clamp was removed and multiple de-airing maneuvers performed.  Ventricular and atrial pacing wires were placed and after final de-airing and removal of the left ventricular vent was removed the patient was weaned from cardiopulmonary bypass on inotropic support. Protamine was delivered and with adequate hemodynamics the patient was decannulated and sites oversewn were necessary.  Chest tubes were brought inferior stab wounds and secured with adequate hemostasis the sternum was reapproximated with interrupted stainless steel wire and the presternal subcutaneous tissue and skin were closed in multiple layers absorbable suture.  Sterile dressings were applied.  Patient tolerated procedure well.

## 2023-03-02 NOTE — Progress Notes (Signed)
At approximately 1600, this RN and Cheryll Cockayne assessed patient's chest tube and chest tube site. Upon assessment, large amount of sanguineous drainage was leaking from chest tube insertion sites. MD Agarwala notified. MD Lynetta Mare and MD Lavonna Monarch to bedside. See new orders.

## 2023-03-02 NOTE — Transfer of Care (Signed)
Immediate Anesthesia Transfer of Care Note  Patient: Joyce Potter  Procedure(s) Performed: CORONARY ARTERY BYPASS GRAFTING (CABG) TIMES THREE USING THE LEFT INTERNAL MAMMARY ARTERY (LIMA) AND ENDOSCOPICALLY HARVESTED RIGHT GREATER SAPHENOUS VEIN (Chest) AORTIC VALVE REPLACEMENT (AVR) USING EDWARDS INSPIRIS RESILIA 23 MM AORTIC VALVE (Chest) TRANSESOPHAGEAL ECHOCARDIOGRAM  Patient Location: PACU and SICU  Anesthesia Type:General  Level of Consciousness: Patient remains intubated per anesthesia plan  Airway & Oxygen Therapy: Patient remains intubated per anesthesia plan and Patient placed on Ventilator (see vital sign flow sheet for setting)  Post-op Assessment: Report given to RN and Post -op Vital signs reviewed and stable  Post vital signs: Reviewed and stable  Last Vitals:  Vitals Value Taken Time  BP    Temp 35.8 C 03/02/23 1355  Pulse 83 03/02/23 1355  Resp 18 03/02/23 1355  SpO2 99 % 03/02/23 1355  Vitals shown include unvalidated device data.  Last Pain:  Vitals:   03/02/23 0719  TempSrc:   PainSc: 9       Patients Stated Pain Goal: 2 ([id number])  Complications: No notable events documented.

## 2023-03-02 NOTE — Anesthesia Postprocedure Evaluation (Signed)
Anesthesia Post Note  Patient: Joyce Potter  Procedure(s) Performed: CORONARY ARTERY BYPASS GRAFTING (CABG) TIMES THREE USING THE LEFT INTERNAL MAMMARY ARTERY (LIMA) AND ENDOSCOPICALLY HARVESTED RIGHT GREATER SAPHENOUS VEIN (Chest) AORTIC VALVE REPLACEMENT (AVR) USING EDWARDS INSPIRIS RESILIA 23 MM AORTIC VALVE (Chest) TRANSESOPHAGEAL ECHOCARDIOGRAM     Patient location during evaluation: SICU Anesthesia Type: General Level of consciousness: sedated and patient remains intubated per anesthesia plan Pain management: pain level controlled Vital Signs Assessment: post-procedure vital signs reviewed and stable Respiratory status: patient on ventilator - see flowsheet for VS and patient remains intubated per anesthesia plan Cardiovascular status: stable Anesthetic complications: no   No notable events documented.  Last Vitals:  Vitals:   03/02/23 1705 03/02/23 1715  BP:    Pulse: 89 89  Resp: 16 16  Temp: (!) 35.9 C (!) 36 C  SpO2: 100% 100%    Last Pain:  Vitals:   03/02/23 1715  TempSrc: Core  PainSc:                  Nolon Nations

## 2023-03-02 NOTE — Interval H&P Note (Signed)
History and Physical Interval Note:  03/02/2023 6:38 AM  Joyce Potter  has presented today for surgery, with the diagnosis of SEVERE AS CAD.  The various methods of treatment have been discussed with the patient and family. After consideration of risks, benefits and other options for treatment, the patient has consented to  Procedure(s): CORONARY ARTERY BYPASS GRAFTING (CABG) (N/A) AORTIC VALVE REPLACEMENT (AVR) (N/A) TRANSESOPHAGEAL ECHOCARDIOGRAM (N/A) as a surgical intervention.  The patient's history has been reviewed, patient examined, no change in status, stable for surgery.  I have reviewed the patient's chart and labs.  Questions were answered to the patient's satisfaction.     [name]

## 2023-03-02 NOTE — Consult Note (Addendum)
NAME:  Joyce Potter, MRN:  [id number], DOB:  30-May-1946, LOS: 0 ADMISSION DATE:  03/02/2023, CONSULTATION DATE:  3/6 REFERRING MD:  Dr. Lavonna Monarch, CHIEF COMPLAINT:  AVR w/ CABG   History of Present Illness:  Patient is a 77 year old female with pertinent PMH CAD (stent placed in 2012), severe aortic stenosis, carotid stenosis, HLD, HTN, IDDM, OSA, asthma, PUD, anemia with hx of gi bleeding requiring iron transfusions presents to [facility] ED on 3/6 for AVR w/ CABG.  Patient seen by cardiology on January 2024 for further eval of continual chest pain and SOB with exertion since July 2023. Patient had recently received a cardiac catheterization in July 2023 showing diffuse moderate disease and high-grade disease of the long diagonal.  Patient was treated with medical therapy. EKG showing normal rate with nonspecific ST abnormality.  Echo 12/2022 LVEF 60 to 65%; severe aortic valve stenosis.  Patient evaluated by T CTS and plan for AVR with CABG.  On 3/6 patient presents to [facility] for AVR with CABG x2.  S/p procedure patient intubated/sedated.  PCCM consulted.  Pertinent  Medical History   Past Medical History:  Diagnosis Date   ADHD (attention deficit hyperactivity disorder)    Anemia    iron deficiency   Anxiety    Aortic stenosis    Arthritis    Asthma    Basal cell carcinoma    CAD (coronary artery disease)    s/p Left circumflex stent in 2012   Carotid stenosis    Cataract    Heart murmur    High cholesterol    HTN (hypertension)    Hyperlipidemia    IDDM (insulin dependent diabetes mellitus)    Multiple thyroid nodules    Benign   Neuromuscular disorder (HCC)    Peptic ulcer disease    Pneumonia 1952   PONV (postoperative nausea and vomiting)    Retinal artery occlusion    on left   Sleep apnea    Stroke ([place]) 2018   ischemic     Significant Hospital Events: Including procedures, antibiotic start and stop dates in addition to other pertinent events   3/6 CABG  Interim  History / Subjective:  See above  Objective   Blood pressure (!) 135/59, pulse 60, temperature 98.2 F (36.8 C), temperature source Oral, resp. rate 15, height '5\' 3"'$  (1.6 m), weight 87.5 kg, SpO2 99 %. PAP: (34-49)/(12-19) 47/19      Intake/Output Summary (Last 24 hours) at 03/02/2023 1323 Last data filed at 03/02/2023 1320 Gross per 24 hour  Intake 3150 ml  Output 700 ml  Net 2450 ml   Filed Weights   03/02/23 0654  Weight: 87.5 kg    Examination: General:  critically ill appearing on mech vent HEENT: MM pink/moist; ETT in place Neuro: sedate CV: s1s2, paced w/ rate in 70s, no m/r/g PULM:  dim clear BS bilaterally; on mech vent SIMV; CT in place w/ almost 400 output GI: soft, bsx4 active  Extremities: warm/dry, no edema  Skin: no rashes or lesions    Resolved Hospital Problem list     Assessment & Plan:   Post op vent management Asthma?: on spiriva and albuterol at home OSA: maybe on cpap at home? Plan: - Continue full vent support (4-8cc/kg IBW) - Wean FiO2 for O2 sat > 90% - Daily WUA/SBT, rapid wean with SIMV per protocol - VAP bundle - Pulmonary hygiene - F/u ABG and CXR - PAD protocol for sedation: Precedex for goal RASS 0 to -1 -  will need CPAP post extubation qhs and prn - prn duoneb for wheezing; incruse ellipta starting tomorrow if extubated   S/p 2-vessel CABG w/ AVR Severe aortic stenosis CAD HTN HLD Plan: - Postoperative care per TCTS - CT management per protocol - continue to monitor CT oupt; if continues to put out significant output consider giving FFP and platelets - CXR - wean neo for map goal >65 - give albumin per protocol - cont ancef/vanc for surgical ppx - ASA - On evolocumab at home; consider statin - wean insulin gtt per protocol - pt, ptt, inr, cbc, bmp pending  IDDM Plan: -insulin gtt as above -cbg monitoring  Hx of PUD Anemia with hx of gi bleeding requiring iron transfusions Plan: -PPI -trend  cbc  Anxiety Plan: -hold amitriptyline -resume sertraline  Hx of Carotid stenosis -duplex reveals 1 to 39% right ICA stenosis and a widely patent left carotid endarterectomy without recurrent stenosis Plan: -cont asa -on evolocumab at home -cont to f/u outpt w/ vascular   Best Practice (right click and "Reselect all SmartList Selections" daily)   Diet/type: NPO w/ meds via tube DVT prophylaxis: SCD GI prophylaxis: H2B Lines: Central line and Arterial Line Foley:  Yes, and it is still needed Code Status:  full code Last date of multidisciplinary goals of care discussion [per primary]  Labs   CBC: Recent Labs  Lab 02/25/23 1231 03/02/23 0849 03/02/23 1136 03/02/23 1215 03/02/23 1251 03/02/23 1303 03/02/23 1307  WBC 5.8  --   --   --   --   --   --   HGB 10.5*   < > 6.5* 5.4* 6.8* 8.8* 8.5*  HCT 33.3*   < > 19.0* 16.9* 20.0* 26.0* 25.0*  MCV 91.2  --   --   --   --   --   --   PLT 291  --   --  160  --   --   --    < > = values in this interval not displayed.    Basic Metabolic Panel: Recent Labs  Lab 02/25/23 1231 03/02/23 0849 03/02/23 0858 03/02/23 1003 03/02/23 1020 03/02/23 1051 03/02/23 1136 03/02/23 1251 03/02/23 1303 03/02/23 1307  NA 137   < > 141 139   < > 138 138 136 137 138  K 4.2   < > 3.9 3.5   < > 5.2* 4.6 4.8 4.8 4.6  CL 103  --  104 104  --  103 101  --  101  --   CO2 21*  --   --   --   --   --   --   --   --   --   GLUCOSE 69*  --  83 84  --  92 106*  --  218*  --   BUN 18  --  14 12  --  12 12  --  13  --   CREATININE 0.76  --  0.60 0.50  --  0.50 0.50  --  0.50  --   CALCIUM 9.2  --   --   --   --   --   --   --   --   --    < > = values in this interval not displayed.   GFR: Estimated Creatinine Clearance: 63.7 mL/min (by C-G formula based on SCr of 0.5 mg/dL). Recent Labs  Lab 02/25/23 1231  WBC 5.8    Liver Function Tests: Recent Labs  Lab  02/25/23 1231  AST 21  ALT 17  ALKPHOS 73  BILITOT 0.8  PROT 6.9  ALBUMIN  3.8   No results for input(s): "LIPASE", "AMYLASE" in the last 168 hours. No results for input(s): "AMMONIA" in the last 168 hours.  ABG    Component Value Date/Time   PHART 7.399 03/02/2023 1307   PCO2ART 36.6 03/02/2023 1307   PO2ART 256 (H) 03/02/2023 1307   HCO3 22.6 03/02/2023 1307   TCO2 24 03/02/2023 1307   ACIDBASEDEF 2.0 03/02/2023 1307   O2SAT 100 03/02/2023 1307     Coagulation Profile: Recent Labs  Lab 02/25/23 1231  INR 1.0    Cardiac Enzymes: No results for input(s): "CKTOTAL", "CKMB", "CKMBINDEX", "TROPONINI" in the last 168 hours.  HbA1C: Hgb A1c MFr Bld  Date/Time Value Ref Range Status  05/21/2020 10:08 AM 7.3 (H) 4.8 - 5.6 % Final    Comment:    (NOTE) Pre diabetes:          5.7%-6.4% Diabetes:              >6.4% Glycemic control for   <7.0% adults with diabetes   05/30/2016 12:56 AM 8.1 (H) 4.0 - 6.0 % Final    CBG: Recent Labs  Lab 02/25/23 1038 03/02/23 0656  GLUCAP 107* 84    Review of Systems:   Patient is intubated. Therefore history has been obtained from chart review.    Past Medical History:  She,  has a past medical history of ADHD (attention deficit hyperactivity disorder), Anemia, Anxiety, Aortic stenosis, Arthritis, Asthma, Basal cell carcinoma, CAD (coronary artery disease), Carotid stenosis, Cataract, Heart murmur, High cholesterol, HTN (hypertension), Hyperlipidemia, IDDM (insulin dependent diabetes mellitus), Multiple thyroid nodules, Neuromuscular disorder ([place]), Peptic ulcer disease, Pneumonia (1952), PONV (postoperative nausea and vomiting), Retinal artery occlusion, Sleep apnea, and Stroke ([place]) (2018).   Surgical History:   Past Surgical History:  Procedure Laterality Date   ABDOMINAL HYSTERECTOMY     ARTERY BIOPSY Left 11/19/2015   Procedure: BIOPSY TEMPORAL ARTERY;  Surgeon: Algernon Huxley, MD;  Location: [facility] ORS;  Service: Vascular;  Laterality: Left;   BREAST BIOPSY Left 2002   core- neg   BREAST EXCISIONAL  BIOPSY     ??? not visible scar on neither breast- pt believes it to be right breast    CARDIAC CATHETERIZATION N/A 08/08/2015   Procedure: Right and Left Heart Cath and Coronary Angiography;  Surgeon: Teodoro Spray, MD;  Location: [place] CV LAB;  Service: Cardiovascular;  Laterality: N/A;   CARDIAC CATHETERIZATION Bilateral 09/03/2016   Procedure: Right/Left Heart Cath and Coronary Angiography;  Surgeon: Teodoro Spray, MD;  Location: [place] [facility] LAB;  Service: Cardiovascular;  Laterality: Bilateral;   CATARACT EXTRACTION W/ INTRAOCULAR LENS  IMPLANT, BILATERAL     COLONOSCOPY     in 2013- internal hemorrhoids   COLONOSCOPY WITH PROPOFOL N/A 07/07/2018   Procedure: COLONOSCOPY WITH PROPOFOL;  Surgeon: Manya Silvas, MD;  Location: [facility] ENDOSCOPY;  Service: Endoscopy;  Laterality: N/A;   COLONOSCOPY WITH PROPOFOL N/A 05/22/2020   Procedure: COLONOSCOPY WITH PROPOFOL;  Surgeon: Jonathon Bellows, MD;  Location: [facility] ENDOSCOPY;  Service: Gastroenterology;  Laterality: N/A;   CORONARY ANGIOGRAPHY N/A 09/14/2017   Procedure: CORONARY ANGIOGRAPHY;  Surgeon: Teodoro Spray, MD;  Location: [place] CV LAB;  Service: Cardiovascular;  Laterality: N/A;   CORONARY ANGIOPLASTY     CORONARY STENT INTERVENTION N/A 05/15/2020   Procedure: coronary angiography;  Surgeon: Teodoro Spray, MD;  Location:  [place] CV LAB;  Service: Cardiovascular;  Laterality: N/A;   CORONARY STENT INTERVENTION N/A 05/15/2020   Procedure: CORONARY STENT INTERVENTION;  Surgeon: Isaias Cowman, MD;  Location: [place] CV LAB;  Service: Cardiovascular;  Laterality: N/A;   CORONARY STENT PLACEMENT     ENDARTERECTOMY Left 10/01/2015   Procedure: ENDARTERECTOMY CAROTID;  Surgeon: Algernon Huxley, MD;  Location: [facility] ORS;  Service: Vascular;  Laterality: Left;   ENTEROSCOPY N/A 07/24/2020   Procedure: ENTEROSCOPY;  Surgeon: Virgel Manifold, MD;  Location: [facility] ENDOSCOPY;  Service: Endoscopy;  Laterality: N/A;    ESOPHAGOGASTRODUODENOSCOPY     ESOPHAGOGASTRODUODENOSCOPY (EGD) WITH PROPOFOL N/A 05/31/2016   Procedure: ESOPHAGOGASTRODUODENOSCOPY (EGD) WITH PROPOFOL;  Surgeon: Manya Silvas, MD;  Location: [facility] ENDOSCOPY;  Service: Endoscopy;  Laterality: N/A;   ESOPHAGOGASTRODUODENOSCOPY (EGD) WITH PROPOFOL N/A 07/07/2018   Procedure: ESOPHAGOGASTRODUODENOSCOPY (EGD) WITH PROPOFOL;  Surgeon: Manya Silvas, MD;  Location: [facility] ENDOSCOPY;  Service: Endoscopy;  Laterality: N/A;   ESOPHAGOGASTRODUODENOSCOPY (EGD) WITH PROPOFOL N/A 07/17/2018   Procedure: ESOPHAGOGASTRODUODENOSCOPY (EGD) WITH PROPOFOL;  Surgeon: Lucilla Lame, MD;  Location: [facility] ENDOSCOPY;  Service: Endoscopy;  Laterality: N/A;   ESOPHAGOGASTRODUODENOSCOPY (EGD) WITH PROPOFOL N/A 05/21/2020   Procedure: ESOPHAGOGASTRODUODENOSCOPY (EGD) WITH PROPOFOL;  Surgeon: Jonathon Bellows, MD;  Location: [facility] ENDOSCOPY;  Service: Gastroenterology;  Laterality: N/A;   ESOPHAGOGASTRODUODENOSCOPY (EGD) WITH PROPOFOL N/A 06/13/2020   Procedure: ESOPHAGOGASTRODUODENOSCOPY (EGD) WITH PROPOFOL;  Surgeon: Lucilla Lame, MD;  Location: [facility] ENDOSCOPY;  Service: Endoscopy;  Laterality: N/A;   EYE SURGERY     GIVENS CAPSULE STUDY N/A 04/17/2013   Procedure: GIVENS CAPSULE STUDY;  Surgeon: Arta Silence, MD;  Location: [facility] ENDOSCOPY;  Service: Endoscopy;  Laterality: N/A;  patient ate breakfast at [place] 06/11/2020   Procedure: GIVENS CAPSULE STUDY;  Surgeon: Virgel Manifold, MD;  Location: [facility] ENDOSCOPY;  Service: Endoscopy;  Laterality: N/A;   GIVENS CAPSULE STUDY N/A 01/29/2021   Procedure: GIVENS CAPSULE STUDY;  Surgeon: Virgel Manifold, MD;  Location: [facility] ENDOSCOPY;  Service: Endoscopy;  Laterality: N/A;   GIVENS CAPSULE STUDY N/A 08/24/2021   Procedure: GIVENS CAPSULE STUDY;  Surgeon: Virgel Manifold, MD;  Location: [facility] ENDOSCOPY;  Service: Endoscopy;  Laterality: N/A;   HEMORRHOID BANDING  07/27/2016   Dr Nicholes Stairs   LEFT  HEART CATH AND CORONARY ANGIOGRAPHY N/A 07/08/2022   Procedure: LEFT HEART CATH AND CORONARY ANGIOGRAPHY;  Surgeon: Dionisio David, MD;  Location: [place] CV LAB;  Service: Cardiovascular;  Laterality: N/A;   PARTIAL HYSTERECTOMY     RIGHT AND LEFT HEART CATH Bilateral 09/14/2017   Procedure: RIGHT AND LEFT HEART CATH;  Surgeon: Teodoro Spray, MD;  Location: [place] CV LAB;  Service: Cardiovascular;  Laterality: Bilateral;   RIGHT HEART CATH AND CORONARY ANGIOGRAPHY N/A 01/26/2023   Procedure: RIGHT HEART CATH AND CORONARY ANGIOGRAPHY;  Surgeon: Early Osmond, MD;  Location: [place] CV LAB;  Service: Cardiovascular;  Laterality: N/A;   RIGHT/LEFT HEART CATH AND CORONARY ANGIOGRAPHY Right 05/15/2020   Procedure: Right/Left Heart cath;  Surgeon: Teodoro Spray, MD;  Location: [place] CV LAB;  Service: Cardiovascular;  Laterality: Right;   TRIGGER FINGER RELEASE       Social History:   reports that she quit smoking about 38 years ago. Her smoking use included cigarettes. She has never used smokeless tobacco. She reports that she does not drink alcohol and does not use drugs.   Family History:  Her family  history includes Breast cancer (age of onset: 48) in her mother; COPD in her father; Diabetes in her father; Seizures in her father; Stroke in her father; Uterine cancer in her mother. There is no history of Colon cancer.   Allergies Allergies  Allergen Reactions   Sulfa Antibiotics Other (See Comments)    Rash and flushing   Invokamet [Canagliflozin-Metformin Hcl] Other (See Comments)    Yeast Infection   Lovastatin Rash   Penicillins Rash    Has patient had a PCN reaction causing immediate rash, facial/tongue/throat swelling, SOB or lightheadedness with hypotension:Yes Has patient had a PCN reaction causing severe rash involving mucus membranes or skin necrosis:all over body Has patient had a PCN reaction that required hospitalization: No Has patient had a PCN  reaction occurring within the last 10 years: No If all of the above answers are "NO", then may proceed with Cephalosporin use.    Vicodin [Hydrocodone-Acetaminophen] Rash     Home Medications  Prior to Admission medications   Medication Sig Start Date End Date Taking? Authorizing Provider  albuterol (VENTOLIN HFA) 108 (90 Base) MCG/ACT inhaler Inhale 2 puffs into the lungs every 4 (four) hours as needed for wheezing or shortness of breath.    Yes [provider]  amitriptyline (ELAVIL) 25 MG tablet Take 25 mg by mouth at bedtime as needed for sleep.    Yes [provider]  aspirin EC 81 MG EC tablet Take 1 tablet (81 mg total) by mouth daily. Swallow whole. 06/16/20  Yes [name], MD  Dulaglutide 3 MG/0.5ML SOPN Inject 3 mg into the skin every Saturday. 04/16/22  Yes [provider]  insulin NPH-regular Human (NOVOLIN 70/30) (70-30) 100 UNIT/ML injection Inject 30 Units into the skin with breakfast, with lunch, and with evening meal.   Yes [provider]  isosorbide mononitrate (IMDUR) 60 MG 24 hr tablet Take 60 mg by mouth 2 (two) times daily. 09/30/22  Yes [provider]  losartan (COZAAR) 25 MG tablet Take 25 mg by mouth daily. 07/06/22  Yes [provider]  metoprolol succinate (TOPROL-XL) 100 MG 24 hr tablet Take 100-200 mg by mouth See admin instructions. Take 200 mg by mouth in the morning and 100 mg in the evening 11/22/22  Yes [provider]  Multiple Vitamin (MULTI-VITAMIN) tablet Take 1 tablet by mouth daily.   Yes [provider]  pantoprazole (PROTONIX) 40 MG tablet Take 40 mg by mouth daily.   Yes [provider]  sertraline (ZOLOFT) 25 MG tablet Take 1 tablet (25 mg total) by mouth daily. 02/04/23  Yes [name], MD  SPIRIVA HANDIHALER 18 MCG inhalation capsule Place 18 mcg into inhaler and inhale at bedtime. 06/02/20  Yes [provider]  traMADol (ULTRAM) 50 MG tablet Take 50 mg  by mouth every 6 (six) hours as needed for moderate pain.   Yes [provider]  BD INSULIN SYRINGE U/F 31G X 5/16" 1 ML MISC  10/09/21   [provider]  Evolocumab (REPATHA SURECLICK) [id number] MG/ML SOAJ Inject 140 mg into the skin every 14 (fourteen) days. 02/16/23   Rise Mu, PA-C  ONETOUCH VERIO test strip  09/06/20   [provider]  Vitamin A 2400 MCG (8000 UT) CAPS Take 8,000 Units by mouth daily.    [provider]     Critical care time: 45 minutes    JD Rexene Agent [place] 03/02/2023, 1:24 PM  Please see [email or web address]  for pager details.  From 7A-7P if no response, please call [phone]. After hours, please call ELink [phone].

## 2023-03-02 NOTE — Anesthesia Procedure Notes (Signed)
Arterial Line Insertion Start/End3/05/2023 7:50 AM, 03/02/2023 7:52 AM Performed by: Inda Coke, CRNA, CRNA  Patient location: Pre-op. Preanesthetic checklist: patient identified, IV checked, site marked, risks and benefits discussed, surgical consent, monitors and equipment checked, pre-op evaluation, timeout performed and anesthesia consent Lidocaine 1% used for infiltration Left, radial was placed Catheter size: 20 G Hand hygiene performed  and maximum sterile barriers used  Allen's test indicative of satisfactory collateral circulation Attempts: 1 Procedure performed without using ultrasound guided technique. Following insertion, dressing applied and Biopatch. Post procedure assessment: normal  Patient tolerated the procedure well with no immediate complications.

## 2023-03-02 NOTE — Hospital Course (Addendum)
Joyce Merritts, MD    History of Present Illness:     Pt is a very pleasant 77 yo female who has been having progressive exertional CP and DOE. Pt has progressed to where she is having these symptoms with minimal exertion and does not have at rest. She has a history of PCI of RCA following angina and has done well till recently. She also has had GI bleeding with resolution after colonoscopy however her iron levels have not stabilized and she requires iron infusions on occasion and her anemia will cause her symptoms to worsen. Her HCT was 30 last checked. 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 She has had a recent work up with echo with severe AS with a mean gradient of 50mmHg. She has normal LV function. She had a cath with significant LAD stenosis with tight Diag stenosis felt to be a difficult PCI if attempted. Pt was felt best served with surgical revascularization and SAVR.  Dr. Lavonna Monarch reviewed the patient's diagnostic studies and determined surgical intervention would provide this patient the best long term treatment. Dr. Lavonna Monarch discussed the patient's treatment options as well as the risks and benefits of surgery. Joyce Potter was agreeable to proceed with surgery.  Hospital Course: Joyce Potter arrive at [facility] and was brought to the operating room on 03/02/23. She underwent coronary artery bypass grafting utilizing LIMA to LAD and RSVG to Diagonal as well as endoscoping greater saphenous vein harvest of the right thigh. She also underwent aortic valve replacement utilizing a 38mm Inspiris Resilia valve. She tolerated the procedure without complication and was transferred to the SICU in stable condition.  Postoperative Hospital course:  The patient was extubated on the morning postoperative day I.  She has remained hemodynamically stable and vasopressors  and inotropic support were weaned without difficulty.  She has been started on midodrine to assist with blood pressures.  She does have a postoperative expected acute blood loss anemia and has required transfusion of packed cells.  Additionally she has received iron infusions.  The PCCM service has assisted with ICU management.  She does have a expected postoperative volume overload but is responding well to diuretics.  On postop day 2 she did increase in BUN/creatinine to 26 and 1.36 respectively and values  were monitored closely.  She is started on a course of progressive ambulation using standard cardiac rehab protocols. She does have a right-sided pleural effusion which is being monitored with serial x-rays and may require thoracentesis depending on progression.  Oxygen has been weaned over time and she undergone usual pulmonary hygiene maneuvers. The patient's hypotension improved and she was taken off Midodrine. Beta blocker was held due to hypotension. She was started on diuretics for volume overloaded state. Her chest tubes were removed without complication. She developed atrial fibrillation with a controlled rate, oral Amiodarone was started. She had postoperative blood loss anemia and was transfused with 1 unit of packed red blood cells. Lovenox was held.  She has continued to show steady progress in her physical recovery. She has been evaluated by both Physical and Occupational Therapy who feel she would benefit from a short-term rehabilitation stay in a skilled nursing facility at time of discharge. This would also help family as her husband has significant dementia and she is one of his primary caretakers. Her creatinine normalized,  IV lasix was continued for volume overload state. She was hypertensive, Losartan was titrated as able. Patient had ecchymosis, small hematoma, erythema and was put on Doxycycline on 03/17. Patient chose facility and will be stable for discharge on 03/18.

## 2023-03-02 NOTE — Brief Op Note (Signed)
03/02/2023  1:29 PM  PATIENT:  Joyce Potter  77 y.o. female  PRE-OPERATIVE DIAGNOSIS:  SEVERE AS CAD  POST-OPERATIVE DIAGNOSIS:  * No post-op diagnosis entered *  PROCEDURE:  CORONARY ARTERY BYPASS GRAFTING (CABG) TIMES TWO USING THE LEFT INTERNAL MAMMARY ARTERY (LIMA) AND ENDOSCOPICALLY HARVESTED RIGHT GREATER SAPHENOUS VEIN  AORTIC VALVE REPLACEMENT (AVR) USING EDWARDS INSPIRIS RESILIA 23 MM AORTIC VALVE  TRANSESOPHAGEAL ECHOCARDIOGRAM -LIMA to LAD -SVG to Diagonal Vein harvest time: 7mn Vein prep time: 180m  SURGEON:  Surgeon(s) and Role:  WeCoralie CommonMD - Primary  PHYSICIAN ASSISTANT: BaWynelle BeckmannA-C, MyEnid CutterA-C  ASSISTANTS: KrFarrel GordonA-C   ANESTHESIA:   general  EBL:  450cc  BLOOD ADMINISTERED:none  DRAINS:  Pleural and mediastinal chest tubes, right leg jp drain    LOCAL MEDICATIONS USED:  NONE  SPECIMEN:  Source of Specimen:  Aortic valve  DISPOSITION OF SPECIMEN:  PATHOLOGY  COUNTS CORRECT:  YES  DICTATION: .Dragon Dictation  PLAN OF CARE: Admit to inpatient   PATIENT DISPOSITION:  ICU - intubated and hemodynamically stable.   Delay start of Pharmacological VTE agent (>24hrs) due to surgical blood loss or risk of bleeding: yes

## 2023-03-02 NOTE — Anesthesia Procedure Notes (Signed)
Central Venous Catheter Insertion Performed by: Nolon Nations, MD, anesthesiologist Start/End3/05/2023 8:17 AM, 03/02/2023 8:21 AM Patient location: Pre-op. Preanesthetic checklist: patient identified, IV checked, site marked, risks and benefits discussed, surgical consent, monitors and equipment checked, pre-op evaluation, timeout performed and anesthesia consent Position: Trendelenburg Hand hygiene performed  and maximum sterile barriers used  PA cath was placed.Swan type:thermodilution PA Cath depth:47 Procedure performed without using ultrasound guided technique. Attempts: 1 Post procedure assessment: no air and free fluid flow  Patient tolerated the procedure well with no immediate complications.

## 2023-03-02 NOTE — Anesthesia Procedure Notes (Signed)
Procedure Name: Intubation Date/Time: 03/02/2023 8:44 AM  Performed by: Inda Coke, CRNAPre-anesthesia Checklist: Patient identified, Emergency Drugs available, Suction available, Timeout performed and Patient being monitored Patient Re-evaluated:Patient Re-evaluated prior to induction Oxygen Delivery Method: Circle system utilized Preoxygenation: Pre-oxygenation with 100% oxygen Induction Type: IV induction Ventilation: Mask ventilation without difficulty and Oral airway inserted - appropriate to patient size Laryngoscope Size: Mac and 3 Grade View: Grade I Tube type: Oral Tube size: 8.0 mm Number of attempts: 1 Airway Equipment and Method: Stylet Placement Confirmation: ETT inserted through vocal cords under direct vision, positive ETCO2, CO2 detector and breath sounds checked- equal and bilateral Secured at: 22 cm Tube secured with: Tape Dental Injury: Teeth and Oropharynx as per pre-operative assessment

## 2023-03-03 ENCOUNTER — Inpatient Hospital Stay (HOSPITAL_COMMUNITY): Payer: Medicare HMO

## 2023-03-03 ENCOUNTER — Encounter (HOSPITAL_COMMUNITY): Payer: Self-pay | Admitting: Thoracic Surgery (Cardiothoracic Vascular Surgery)

## 2023-03-03 DIAGNOSIS — J9601 Acute respiratory failure with hypoxia: Secondary | ICD-10-CM | POA: Diagnosis not present

## 2023-03-03 LAB — MAGNESIUM
Magnesium: 2.8 mg/dL — ABNORMAL HIGH (ref 1.7–2.4)
Magnesium: 2.5 mg/dL — ABNORMAL HIGH (ref 1.7–2.4)

## 2023-03-03 LAB — POCT I-STAT 7, (LYTES, BLD GAS, ICA,H+H)
Acid-Base Excess: 0 mmol/L (ref 0.0–2.0)
Bicarbonate: 25.6 mmol/L (ref 20.0–28.0)
Calcium, Ion: 1.2 mmol/L (ref 1.15–1.40)
HCT: 20 % — ABNORMAL LOW (ref 36.0–46.0)
Hemoglobin: 6.8 g/dL — CL (ref 12.0–15.0)
O2 Saturation: 95 %
Patient temperature: 36.7
Potassium: 4.1 mmol/L (ref 3.5–5.1)
Sodium: 140 mmol/L (ref 135–145)
TCO2: 27 mmol/L (ref 22–32)
pCO2 arterial: 44.9 mmHg (ref 32–48)
pH, Arterial: 7.362 (ref 7.35–7.45)
pO2, Arterial: 77 mmHg — ABNORMAL LOW (ref 83–108)

## 2023-03-03 LAB — PREPARE PLATELET PHERESIS: Unit division: 0

## 2023-03-03 LAB — BASIC METABOLIC PANEL
Anion gap: 5 (ref 5–15)
Anion gap: 5 (ref 5–15)
BUN: 15 mg/dL (ref 8–23)
BUN: 21 mg/dL (ref 8–23)
CO2: 26 mmol/L (ref 22–32)
CO2: 25 mmol/L (ref 22–32)
Calcium: 7.9 mg/dL — ABNORMAL LOW (ref 8.9–10.3)
Calcium: 7.7 mg/dL — ABNORMAL LOW (ref 8.9–10.3)
Chloride: 108 mmol/L (ref 98–111)
Chloride: 108 mmol/L (ref 98–111)
Creatinine, Ser: 0.91 mg/dL (ref 0.44–1.00)
Creatinine, Ser: 1.09 mg/dL — ABNORMAL HIGH (ref 0.44–1.00)
GFR, Estimated: >60 mL/min (ref >60)
GFR, Estimated: 53 mL/min — ABNORMAL LOW (ref >60)
Glucose, Bld: 118 mg/dL — ABNORMAL HIGH (ref 70–99)
Glucose, Bld: 135 mg/dL — ABNORMAL HIGH (ref 70–99)
Potassium: 3.9 mmol/L (ref 3.5–5.1)
Potassium: 3.7 mmol/L (ref 3.5–5.1)
Sodium: 139 mmol/L (ref 135–145)
Sodium: 138 mmol/L (ref 135–145)

## 2023-03-03 LAB — CBC
HCT: 22.8 % — ABNORMAL LOW (ref 36.0–46.0)
HCT: 20.1 % — ABNORMAL LOW (ref 36.0–46.0)
HCT: 21.1 % — ABNORMAL LOW (ref 36.0–46.0)
Hemoglobin: 7.4 g/dL — ABNORMAL LOW (ref 12.0–15.0)
Hemoglobin: 7 g/dL — ABNORMAL LOW (ref 12.0–15.0)
Hemoglobin: 7.2 g/dL — ABNORMAL LOW (ref 12.0–15.0)
MCH: 26.5 pg (ref 26.0–34.0)
MCH: 28 pg (ref 26.0–34.0)
MCH: 29 pg (ref 26.0–34.0)
MCHC: 32.5 g/dL (ref 30.0–36.0)
MCHC: 34.8 g/dL (ref 30.0–36.0)
MCHC: 34.1 g/dL (ref 30.0–36.0)
MCV: 81.7 fL (ref 80.0–100.0)
MCV: 80.4 fL (ref 80.0–100.0)
MCV: 85.1 fL (ref 80.0–100.0)
Platelets: 138 10*3/uL — ABNORMAL LOW (ref 150–400)
Platelets: 140 10*3/uL — ABNORMAL LOW (ref 150–400)
Platelets: 93 10*3/uL — ABNORMAL LOW (ref 150–400)
RBC: 2.79 MIL/uL — ABNORMAL LOW (ref 3.87–5.11)
RBC: 2.5 MIL/uL — ABNORMAL LOW (ref 3.87–5.11)
RBC: 2.48 MIL/uL — ABNORMAL LOW (ref 3.87–5.11)
RDW: 18.8 % — ABNORMAL HIGH (ref 11.5–15.5)
RDW: 19 % — ABNORMAL HIGH (ref 11.5–15.5)
RDW: 19.9 % — ABNORMAL HIGH (ref 11.5–15.5)
WBC: 7.9 10*3/uL (ref 4.0–10.5)
WBC: 8.4 10*3/uL (ref 4.0–10.5)
WBC: 7.9 10*3/uL (ref 4.0–10.5)
nRBC: 0 % (ref 0.0–0.2)
nRBC: 0 % (ref 0.0–0.2)
nRBC: 0 % (ref 0.0–0.2)

## 2023-03-03 LAB — BPAM CRYOPRECIPITATE
Blood Product Expiration Date: 202403062215
ISSUE DATE / TIME: 202403061709
Unit Type and Rh: 5100

## 2023-03-03 LAB — GLUCOSE, CAPILLARY
Glucose-Capillary: 110 mg/dL — ABNORMAL HIGH (ref 70–99)
Glucose-Capillary: 124 mg/dL — ABNORMAL HIGH (ref 70–99)
Glucose-Capillary: 128 mg/dL — ABNORMAL HIGH (ref 70–99)
Glucose-Capillary: 130 mg/dL — ABNORMAL HIGH (ref 70–99)
Glucose-Capillary: 133 mg/dL — ABNORMAL HIGH (ref 70–99)
Glucose-Capillary: 135 mg/dL — ABNORMAL HIGH (ref 70–99)
Glucose-Capillary: 140 mg/dL — ABNORMAL HIGH (ref 70–99)
Glucose-Capillary: 140 mg/dL — ABNORMAL HIGH (ref 70–99)
Glucose-Capillary: 140 mg/dL — ABNORMAL HIGH (ref 70–99)
Glucose-Capillary: 140 mg/dL — ABNORMAL HIGH (ref 70–99)
Glucose-Capillary: 140 mg/dL — ABNORMAL HIGH (ref 70–99)
Glucose-Capillary: 141 mg/dL — ABNORMAL HIGH (ref 70–99)
Glucose-Capillary: 143 mg/dL — ABNORMAL HIGH (ref 70–99)
Glucose-Capillary: 145 mg/dL — ABNORMAL HIGH (ref 70–99)
Glucose-Capillary: 150 mg/dL — ABNORMAL HIGH (ref 70–99)
Glucose-Capillary: 166 mg/dL — ABNORMAL HIGH (ref 70–99)

## 2023-03-03 LAB — PREPARE FRESH FROZEN PLASMA: Unit division: 0

## 2023-03-03 LAB — BPAM FFP
Blood Product Expiration Date: 202403062359
ISSUE DATE / TIME: 202403061558
Unit Type and Rh: 6200

## 2023-03-03 LAB — PREPARE CRYOPRECIPITATE: Unit division: 0

## 2023-03-03 LAB — BPAM PLATELET PHERESIS
Blood Product Expiration Date: 202403072359
ISSUE DATE / TIME: 202403061556
Unit Type and Rh: 2800

## 2023-03-03 LAB — COOXEMETRY PANEL
Carboxyhemoglobin: 1.3 % (ref 0.5–1.5)
Carboxyhemoglobin: 2.1 % — ABNORMAL HIGH (ref 0.5–1.5)
Methemoglobin: <0.7 % (ref 0.0–1.5)
Methemoglobin: <0.7 % (ref 0.0–1.5)
O2 Saturation: 54.7 %
O2 Saturation: 79.3 %
Total hemoglobin: <6.9 g/dL — CL (ref 12.0–16.0)
Total hemoglobin: 8.1 g/dL — ABNORMAL LOW (ref 12.0–16.0)

## 2023-03-03 LAB — PREPARE RBC (CROSSMATCH)

## 2023-03-03 MED ORDER — MIDODRINE HCL 5 MG PO TABS
5.0000 mg | ORAL_TABLET | Freq: Three times a day (TID) | ORAL | Status: DC
  Administered 2023-03-03 – 2023-03-04 (×5): 5 mg via ORAL
  Filled 2023-03-03 (×6): qty 1

## 2023-03-03 MED ORDER — INSULIN ASPART 100 UNIT/ML IJ SOLN
0.0000 [IU] | INTRAMUSCULAR | Status: DC
  Administered 2023-03-03 – 2023-03-04 (×3): 2 [IU] via SUBCUTANEOUS
  Administered 2023-03-04: 4 [IU] via SUBCUTANEOUS
  Administered 2023-03-04 (×2): 2 [IU] via SUBCUTANEOUS

## 2023-03-03 MED ORDER — SODIUM CHLORIDE 0.9% IV SOLUTION: Freq: Once | INTRAVENOUS | Status: DC

## 2023-03-03 MED ORDER — SODIUM CHLORIDE 0.9 % IV SOLN
200.0000 mg | Freq: Once | INTRAVENOUS | Status: AC
  Administered 2023-03-03: 200 mg via INTRAVENOUS
  Filled 2023-03-03: qty 10

## 2023-03-03 MED ORDER — POTASSIUM CHLORIDE 10 MEQ/50ML IV SOLN
10.0000 meq | INTRAVENOUS | Status: AC
  Administered 2023-03-03 (×3): 10 meq via INTRAVENOUS
  Filled 2023-03-03 (×3): qty 50

## 2023-03-03 MED ORDER — ORAL CARE MOUTH RINSE: 15.0000 mL | OROMUCOSAL | Status: DC | PRN

## 2023-03-03 MED ORDER — FUROSEMIDE 10 MG/ML IJ SOLN
20.0000 mg | Freq: Once | INTRAMUSCULAR | Status: AC
  Administered 2023-03-03: 20 mg via INTRAVENOUS
  Filled 2023-03-03: qty 2

## 2023-03-03 MED ORDER — PANTOPRAZOLE SODIUM 40 MG PO TBEC
40.0000 mg | DELAYED_RELEASE_TABLET | Freq: Every day | ORAL | Status: DC
  Administered 2023-03-03: 40 mg via ORAL
  Filled 2023-03-03: qty 1

## 2023-03-03 NOTE — Procedures (Signed)
Extubation Procedure Note  Patient Details:   Name: Joyce Potter DOB: 10-Apr-1946 MRN: [id number]   Airway Documentation:    Vent end date: 03/03/23 Vent end time: 0847   Evaluation  O2 sats: stable throughout Complications: No apparent complications Patient did tolerate procedure well. Bilateral Breath Sounds: Clear, Diminished   Yes  Pt extubated per MD order. Placed on 3L [place]. NIF -24, VC .14m. Positive cuff leak noted, no stridor heard.  RT will continue to monitor.   MEugene Gavia3/06/2023, 8:48 AM

## 2023-03-03 NOTE — Progress Notes (Signed)
[address]       [place] [address]             [phone]      1 Day Post-Op  Procedure(s) (LRB): CORONARY ARTERY BYPASS GRAFTING (CABG) TIMES THREE USING THE LEFT INTERNAL MAMMARY ARTERY (LIMA) AND ENDOSCOPICALLY HARVESTED RIGHT GREATER SAPHENOUS VEIN (N/A) AORTIC VALVE REPLACEMENT (AVR) USING EDWARDS INSPIRIS RESILIA 23 MM AORTIC VALVE (N/A) TRANSESOPHAGEAL ECHOCARDIOGRAM (N/A)  Total Length of Stay:  LOS: 1 day   SUBJECTIVE: Has been kept intubated overnight Bleeding resolved CI has remained low despite fluid replacement and adequate perfusion Vitals:   03/03/23 0645 03/03/23 0700  BP:  104/69  Pulse: 92 92  Resp: 17 16  Temp: 98.1 F (36.7 C) 97.9 F (36.6 C)  SpO2: 95% 99%    Intake/Output      03/06 0701 03/07 0700 03/07 0701 03/08 0700   I.V. (mL/kg) 4213.5 (43.4)    Blood 2355.3    NG/GT 60    IV Piggyback 2182.6    Total Intake(mL/kg) 8811.4 (90.7)    Urine (mL/kg/hr) 1650 (0.7)    Emesis/NG output 150    Drains 34    Chest Tube 870    Total Output 2704    Net +6107.4             sodium chloride 20 mL/hr at 03/03/23 0700   sodium chloride     sodium chloride 20 mL/hr at 03/03/23 0700    ceFAZolin (ANCEF) IV Stopped (03/03/23 0654)   dexmedetomidine (PRECEDEX) IV infusion 0.3 mcg/kg/hr (03/03/23 0700)   insulin 4.2 Units/hr (03/03/23 0700)   lactated ringers     lactated ringers 20 mL/hr at 03/03/23 0700   nitroGLYCERIN     phenylephrine (NEO-SYNEPHRINE) Adult infusion 30 mcg/min (03/03/23 0700)   vasopressin Stopped (03/02/23 1440)    CBC    Component Value Date/Time   WBC 7.9 03/03/2023 0035   RBC 2.79 (L) 03/03/2023 0035   HGB 7.4 (L) 03/03/2023 0035   HGB 12.4 01/21/2023 1213   HCT 22.8 (L) 03/03/2023 0035   HCT 38.7 01/21/2023 1213   PLT 138 (L) 03/03/2023 0035   PLT 224 01/21/2023 1213   MCV 81.7 03/03/2023 0035   MCV 83 01/21/2023 1213   MCV 84 12/17/2014 1222   MCH 26.5 03/03/2023 0035   MCHC  32.5 03/03/2023 0035   RDW 18.8 (H) 03/03/2023 0035   RDW 16.7 (H) 01/21/2023 1213   RDW 14.1 12/17/2014 1222   LYMPHSABS 1.2 12/02/2022 1008   LYMPHSABS 1.6 12/17/2014 1222   MONOABS 0.6 12/02/2022 1008   MONOABS 0.5 12/17/2014 1222   EOSABS 0.4 12/02/2022 1008   EOSABS 0.3 12/17/2014 1222   BASOSABS 0.1 12/02/2022 1008   BASOSABS 0.1 12/17/2014 1222   CMP     Component Value Date/Time   NA 139 03/03/2023 0339   NA 142 01/21/2023 1213   NA 143 05/04/2013 0614   K 3.9 03/03/2023 0339   K 3.8 05/04/2013 0614   CL 108 03/03/2023 0339   CL 110 (H) 05/04/2013 0614   CO2 26 03/03/2023 0339   CO2 27 05/04/2013 0614   GLUCOSE 118 (H) 03/03/2023 0339   GLUCOSE 130 (H) 05/04/2013 0614   BUN 15 03/03/2023 0339   BUN 15 01/21/2023 1213   BUN 12 05/04/2013 0614   CREATININE 0.91 03/03/2023 0339   CREATININE 0.79 08/27/2014 1445   CALCIUM 7.9 (L) 03/03/2023 0339   CALCIUM 8.2 (L)  05/04/2013 0614   PROT 6.9 02/25/2023 1231   PROT 7.6 08/27/2014 1445   ALBUMIN 3.8 02/25/2023 1231   ALBUMIN 3.5 08/27/2014 1445   AST 21 02/25/2023 1231   AST 27 08/27/2014 1445   ALT 17 02/25/2023 1231   ALT 27 08/27/2014 1445   ALKPHOS 73 02/25/2023 1231   ALKPHOS 101 08/27/2014 1445   BILITOT 0.8 02/25/2023 1231   BILITOT 0.4 08/27/2014 1445   GFRNONAA >60 03/03/2023 0339   GFRNONAA >60 08/27/2014 1445   GFRAA >60 07/24/2020 0417   GFRAA >60 08/27/2014 1445   ABG    Component Value Date/Time   PHART 7.337 (L) 03/02/2023 1807   PCO2ART 47.7 03/02/2023 1807   PO2ART 174 (H) 03/02/2023 1807   HCO3 25.7 03/02/2023 1807   TCO2 27 03/02/2023 1807   ACIDBASEDEF 1.0 03/02/2023 1511   O2SAT 99 03/02/2023 1807   CBG (last 3)  Recent Labs    03/03/23 0238 03/03/23 0343 03/03/23 0444  GLUCAP 133* 124* 110*  EXAM Lungs: decreased bilat Card: RR without murmur Ext: warm Neuro: awake following comands   ASSESSMENT: POD #1 sp AVR/CAB x 2 Hemodynamics better than what swan indicates. Good  urine output. Not acidotic Resp: wean to extubate this am. CXR with some R effusion. Follow for now Heme: blood loss anemia. Follow for now Renal: cr ok. Give lasix this am    [name], MD 03/03/2023

## 2023-03-03 NOTE — Consult Note (Signed)
NAME:  Joyce Potter, MRN:  [id number], DOB:  [birth year], LOS: 1 ADMISSION DATE:  03/02/2023, CONSULTATION DATE:  3/6 REFERRING MD:  Dr. Lavonna Monarch, CHIEF COMPLAINT:  AVR w/ CABG   History of Present Illness:  Patient is a 77 year old female with pertinent PMH CAD (stent placed in 2012), severe aortic stenosis, carotid stenosis, HLD, HTN, IDDM, OSA, asthma, PUD, anemia with hx of gi bleeding requiring iron transfusions presents to [facility] ED on 3/6 for AVR w/ CABG.  Patient seen by cardiology on January 2024 for further eval of continual chest pain and SOB with exertion since July 2023. Patient had recently received a cardiac catheterization in July 2023 showing diffuse moderate disease and high-grade disease of the long diagonal.  Patient was treated with medical therapy. EKG showing normal rate with nonspecific ST abnormality.  Echo 12/2022 LVEF 60 to 65%; severe aortic valve stenosis.  Patient evaluated by T CTS and plan for AVR with CABG.  On 3/6 patient presents to [facility] for AVR with CABG x2.  S/p procedure patient intubated/sedated.  PCCM consulted.  Pertinent  Medical History   Past Medical History:  Diagnosis Date   ADHD (attention deficit hyperactivity disorder)    Anemia    iron deficiency   Anxiety    Aortic stenosis    Arthritis    Asthma    Basal cell carcinoma    CAD (coronary artery disease)    s/p Left circumflex stent in 2012   Carotid stenosis    Cataract    Heart murmur    High cholesterol    HTN (hypertension)    Hyperlipidemia    IDDM (insulin dependent diabetes mellitus)    Multiple thyroid nodules    Benign   Neuromuscular disorder (HCC)    Peptic ulcer disease    Pneumonia 1952   PONV (postoperative nausea and vomiting)    Retinal artery occlusion    on left   Sleep apnea    Stroke ([place]) 2018   ischemic     Significant Hospital Events: Including procedures, antibiotic start and stop dates in addition to other pertinent events   3/6 CABG - significant  post operative oozing. Bleeding eventually stopped.  3/7 Extubated.   Interim History / Subjective:  SBT this am.   Objective   Blood pressure 110/60, pulse 93, temperature (!) 97.5 F (36.4 C), resp. rate 13, height '5\' 3"'$  (1.6 m), weight 97.1 kg, SpO2 95 %. PAP: (26-42)/(9-25) 32/17 CO:  [2.9 L/min-3.8 L/min] 3 L/min CI:  [1.5 L/min/m2-2 L/min/m2] 1.6 L/min/m2  Vent Mode: PSV;CPAP FiO2 (%):  [40 %-50 %] 40 % Set Rate:  [16 bmp-18 bmp] 18 bmp Vt Set:  [410 mL] 410 mL PEEP:  [5 cmH20] 5 cmH20 Pressure Support:  [10 cmH20] 10 cmH20   Intake/Output Summary (Last 24 hours) at 03/03/2023 0931 Last data filed at 03/03/2023 [id number] Gross per 24 hour  Intake 8884.08 ml  Output 2824 ml  Net 6060.08 ml    Filed Weights   03/02/23 0654 03/03/23 0600  Weight: 87.5 kg 97.1 kg    Examination: General:  critically ill appearing on mech vent HEENT: MM pink/moist; ETT in place Neuro: awake and following commands.  CV: s1s2,  sinus rhythm PULM:  clear.  GI: soft, bsx4 active  Extremities: warm/dry, no edema  Skin: no rashes or lesions   Ancillary tests  personally reviewed:   HB: 7.0  PLT 140 ScvO2 58%  Assessment & Plan:   Post op vent management Asthma -  no PFT's, possibly cardiac asthma.  S/p 2-vessel CABG w/ AVR Severe aortic stenosis CAD HTN HLD IDDM Hx of PUD Anemia with hx of gi bleeding requiring iron transfusions Anxiety Hx of Carotid stenosis  Plan:   - Passed SBT - will extubate today.  - Progressive ambulation, IS, wean O2 to keep Sat >90% - Diurese today - Wean phenylephrine to keep MAP>65.  - Start midodrine to facilitate pressor weaning.  - hold metoprolol, amiodarone until off pressors.  - Transfuse and give Fe supplement.  - ASA, statin, resume Repatha at discharge. - Hold home bronchodilators, see if she really needs them.  - Continue SSI and resume home insulin and oral agents at discharge.   Best Practice (right click and "Reselect all SmartList  Selections" daily)   Diet/type: Advance diet.  DVT prophylaxis: SCD - hold on LMWH until tomorrow GI prophylaxis: PPI - home medication.  Lines: Central line and Arterial Line - plan to remove PAC today.  Foley:  Yes, and it is still needed - actively diuresing today.  Code Status:  full code Last date of multidisciplinary goals of care discussion [per primary]  CRITICAL CARE Performed by: Kipp Brood   Total critical care time: 35 minutes  Critical care time was exclusive of separately billable procedures and treating other patients.  Critical care was necessary to treat or prevent imminent or life-threatening deterioration.  Critical care was time spent personally by me on the following activities: development of treatment plan with patient and/or surrogate as well as nursing, discussions with consultants, evaluation of patient's response to treatment, examination of patient, obtaining history from patient or surrogate, ordering and performing treatments and interventions, ordering and review of laboratory studies, ordering and review of radiographic studies, pulse oximetry, re-evaluation of patient's condition and participation in multidisciplinary rounds.  Kipp Brood, MD [facility] ICU Physician [place]  Pager: [phone] Mobile: [phone] After hours: [phone].

## 2023-03-03 NOTE — Progress Notes (Signed)
TCTS Progress Note: 1 Day Post-Op Procedure(s) (LRB): CORONARY ARTERY BYPASS GRAFTING (CABG) TIMES THREE USING THE LEFT INTERNAL MAMMARY ARTERY (LIMA) AND ENDOSCOPICALLY HARVESTED RIGHT GREATER SAPHENOUS VEIN (N/A) AORTIC VALVE REPLACEMENT (AVR) USING EDWARDS INSPIRIS RESILIA 23 MM AORTIC VALVE (N/A) TRANSESOPHAGEAL ECHOCARDIOGRAM (N/A)  LOS: 1 day   POD 1  DCD swan today   Off gtts  Sitting up in bed, conversational, using IS  Having nausea.   Abdomen NTTP     Latest Ref Rng & Units 03/03/2023    5:00 PM 03/03/2023    7:08 AM 03/03/2023    6:45 AM  CBC  WBC 4.0 - 10.5 K/uL 7.9   8.4   Hemoglobin 12.0 - 15.0 g/dL 7.2  6.8  7.0   Hematocrit 36.0 - 46.0 % 21.1  20.0  20.1   Platelets 150 - 400 K/uL 93   140        Latest Ref Rng & Units 03/03/2023    5:00 PM 03/03/2023    7:08 AM 03/03/2023    3:39 AM  CMP  Glucose 70 - 99 mg/dL 135   118   BUN 8 - 23 mg/dL 21   15   Creatinine 0.44 - 1.00 mg/dL 1.09   0.91   Sodium 135 - 145 mmol/L 138  140  139   Potassium 3.5 - 5.1 mmol/L 3.7  4.1  3.9   Chloride 98 - 111 mmol/L 108   108   CO2 22 - 32 mmol/L 25   26   Calcium 8.9 - 10.3 mg/dL 7.7   7.9     ABG    Component Value Date/Time   PHART 7.362 03/03/2023 0708   PCO2ART 44.9 03/03/2023 0708   PO2ART 77 (L) 03/03/2023 0708   HCO3 25.6 03/03/2023 0708   TCO2 27 03/03/2023 0708   ACIDBASEDEF 1.0 03/02/2023 1511   O2SAT 79.3 03/03/2023 0921    Vent Mode: PSV;CPAP FiO2 (%):  [40 %-50 %] 40 % Set Rate:  [18 bmp] 18 bmp Vt Set:  [410 mL] 410 mL PEEP:  [5 cmH20] 5 cmH20 Pressure Support:  [10 cmH20] 10 cmH20

## 2023-03-04 ENCOUNTER — Other Ambulatory Visit: Payer: Self-pay | Admitting: Medical

## 2023-03-04 ENCOUNTER — Inpatient Hospital Stay (HOSPITAL_COMMUNITY): Payer: Medicare HMO

## 2023-03-04 DIAGNOSIS — J9601 Acute respiratory failure with hypoxia: Secondary | ICD-10-CM | POA: Diagnosis not present

## 2023-03-04 DIAGNOSIS — Z952 Presence of prosthetic heart valve: Secondary | ICD-10-CM

## 2023-03-04 DIAGNOSIS — I25118 Atherosclerotic heart disease of native coronary artery with other forms of angina pectoris: Secondary | ICD-10-CM

## 2023-03-04 LAB — BASIC METABOLIC PANEL
Anion gap: 8 (ref 5–15)
BUN: 26 mg/dL — ABNORMAL HIGH (ref 8–23)
CO2: 24 mmol/L (ref 22–32)
Calcium: 7.8 mg/dL — ABNORMAL LOW (ref 8.9–10.3)
Chloride: 107 mmol/L (ref 98–111)
Creatinine, Ser: 1.36 mg/dL — ABNORMAL HIGH (ref 0.44–1.00)
GFR, Estimated: 41 mL/min — ABNORMAL LOW (ref >60)
Glucose, Bld: 166 mg/dL — ABNORMAL HIGH (ref 70–99)
Potassium: 4.2 mmol/L (ref 3.5–5.1)
Sodium: 139 mmol/L (ref 135–145)

## 2023-03-04 LAB — CBC
HCT: 20.3 % — ABNORMAL LOW (ref 36.0–46.0)
Hemoglobin: 6.9 g/dL — CL (ref 12.0–15.0)
MCH: 29.1 pg (ref 26.0–34.0)
MCHC: 34 g/dL (ref 30.0–36.0)
MCV: 85.7 fL (ref 80.0–100.0)
Platelets: 119 10*3/uL — ABNORMAL LOW (ref 150–400)
RBC: 2.37 MIL/uL — ABNORMAL LOW (ref 3.87–5.11)
RDW: 20.1 % — ABNORMAL HIGH (ref 11.5–15.5)
WBC: 11.4 10*3/uL — ABNORMAL HIGH (ref 4.0–10.5)
nRBC: 0.2 % (ref 0.0–0.2)

## 2023-03-04 LAB — GLUCOSE, CAPILLARY
Glucose-Capillary: 147 mg/dL — ABNORMAL HIGH (ref 70–99)
Glucose-Capillary: 171 mg/dL — ABNORMAL HIGH (ref 70–99)
Glucose-Capillary: 146 mg/dL — ABNORMAL HIGH (ref 70–99)
Glucose-Capillary: 146 mg/dL — ABNORMAL HIGH (ref 70–99)
Glucose-Capillary: 138 mg/dL — ABNORMAL HIGH (ref 70–99)
Glucose-Capillary: 143 mg/dL — ABNORMAL HIGH (ref 70–99)
Glucose-Capillary: 143 mg/dL — ABNORMAL HIGH (ref 70–99)

## 2023-03-04 LAB — IRON AND TIBC
Iron: 25 ug/dL — ABNORMAL LOW (ref 28–170)
Saturation Ratios: 14 % (ref 10.4–31.8)
TIBC: 182 ug/dL — ABNORMAL LOW (ref 250–450)
UIBC: 157 ug/dL

## 2023-03-04 LAB — PREPARE RBC (CROSSMATCH)

## 2023-03-04 LAB — HEMOGLOBIN AND HEMATOCRIT, BLOOD
HCT: 24.3 % — ABNORMAL LOW (ref 36.0–46.0)
Hemoglobin: 8.1 g/dL — ABNORMAL LOW (ref 12.0–15.0)

## 2023-03-04 LAB — MAGNESIUM: Magnesium: 2.5 mg/dL — ABNORMAL HIGH (ref 1.7–2.4)

## 2023-03-04 LAB — FERRITIN: Ferritin: 284 ng/mL (ref 11–307)

## 2023-03-04 MED ORDER — SODIUM CHLORIDE 0.9% IV SOLUTION: Freq: Once | INTRAVENOUS | Status: DC

## 2023-03-04 MED ORDER — ADULT MULTIVITAMIN W/MINERALS CH
1.0000 | ORAL_TABLET | Freq: Every day | ORAL | Status: DC
  Administered 2023-03-04 – 2023-03-15 (×12): 1 via ORAL
  Filled 2023-03-04 (×12): qty 1

## 2023-03-04 MED ORDER — INSULIN ASPART 100 UNIT/ML IJ SOLN
0.0000 [IU] | INTRAMUSCULAR | Status: DC
  Administered 2023-03-04 – 2023-03-05 (×3): 2 [IU] via SUBCUTANEOUS
  Administered 2023-03-05: 4 [IU] via SUBCUTANEOUS
  Administered 2023-03-05: 2 [IU] via SUBCUTANEOUS

## 2023-03-04 MED ORDER — FUROSEMIDE 10 MG/ML IJ SOLN
40.0000 mg | Freq: Two times a day (BID) | INTRAMUSCULAR | Status: DC
  Administered 2023-03-04 – 2023-03-06 (×6): 40 mg via INTRAVENOUS
  Filled 2023-03-04 (×6): qty 4

## 2023-03-04 MED ORDER — SODIUM CHLORIDE 0.9% IV SOLUTION: Freq: Once | INTRAVENOUS | Status: AC

## 2023-03-04 NOTE — Progress Notes (Signed)
NAME:  Joyce Potter, MRN:  [id number], DOB:  05-07-1946, LOS: 2 ADMISSION DATE:  03/02/2023, CONSULTATION DATE:  3/6 REFERRING MD:  Dr. Lavonna Monarch, CHIEF COMPLAINT:  AVR w/ CABG   History of Present Illness:  Patient is a 77 year old female with pertinent PMH CAD (stent placed in 2012), severe aortic stenosis, carotid stenosis, HLD, HTN, IDDM, OSA, asthma, PUD, anemia with hx of gi bleeding requiring iron transfusions presents to [facility] ED on 3/6 for AVR w/ CABG.  Patient seen by cardiology on January 2024 for further eval of continual chest pain and SOB with exertion since July 2023. Patient had recently received a cardiac catheterization in July 2023 showing diffuse moderate disease and high-grade disease of the long diagonal.  Patient was treated with medical therapy. EKG showing normal rate with nonspecific ST abnormality.  Echo 12/2022 LVEF 60 to 65%; severe aortic valve stenosis.  Patient evaluated by T CTS and plan for AVR with CABG.  On 3/6 patient presents to [facility] for AVR with CABG x2.  S/p procedure patient intubated/sedated.  PCCM consulted.  Pertinent  Medical History   Past Medical History:  Diagnosis Date   ADHD (attention deficit hyperactivity disorder)    Anemia    iron deficiency   Anxiety    Aortic stenosis    Arthritis    Asthma    Basal cell carcinoma    CAD (coronary artery disease)    s/p Left circumflex stent in 2012   Carotid stenosis    Cataract    Heart murmur    High cholesterol    HTN (hypertension)    Hyperlipidemia    IDDM (insulin dependent diabetes mellitus)    Multiple thyroid nodules    Benign   Neuromuscular disorder (HCC)    Peptic ulcer disease    Pneumonia 1952   PONV (postoperative nausea and vomiting)    Retinal artery occlusion    on left   Sleep apnea    Stroke ([place]) 2018   ischemic     Significant Hospital Events: Including procedures, antibiotic start and stop dates in addition to other pertinent events   3/6 CABG - significant  post operative oozing. Bleeding eventually stopped.  3/7 Extubated.   Interim History / Subjective:  Still weak. Pain on deep inspiration predominantly felt in left shoulder.   Objective   Blood pressure (!) 128/54, pulse 62, temperature 98.1 F (36.7 C), temperature source Oral, resp. rate (!) 24, height '5\' 3"'$  (1.6 m), weight 97.7 kg, SpO2 100 %. PAP: (27-40)/(13-21) 33/16 CO:  [4.1 L/min-4.2 L/min] 4.2 L/min CI:  [2.2 L/min/m2] 2.2 L/min/m2      Intake/Output Summary (Last 24 hours) at 03/04/2023 0821 Last data filed at 03/04/2023 0700 Gross per 24 hour  Intake 1848.32 ml  Output 1374 ml  Net 474.32 ml    Filed Weights   03/02/23 0654 03/03/23 0600 03/04/23 0500  Weight: 87.5 kg 97.1 kg 97.7 kg    Examination: General:  critically ill appearing, pale. Sitting in chair HEENT: MM pink/moist Neuro: awake and following commands, no focal deficits CV: s1s2,  sinus rhythm PULM:  crackles at both bases.  GI: soft, bsx4 active  Extremities: warm/dry, no edema  Skin: no rashes or lesions   Ancillary tests  personally reviewed:   HB: 6.9 PLT 119 Creatinine 1.39 CXR shows bibasilar atelectasis and possible right pleural effusion.  Assessment & Plan:   Post op vent management - now extubated Expected post operative atelectasis causing hypoxia Volume overload.  Asthma - no PFT's, possibly cardiac asthma.  S/p 2-vessel CABG w/ AVR Severe aortic stenosis CAD HTN HLD IDDM Hx of PUD Anemia with hx of gi bleeding requiring iron transfusions. Normal marrow examination.  Anxiety Hx of Carotid stenosis  Plan:   - Progressive ambulation, IS, wean O2 to keep Sat >90% - Optimize pain control, add lidocaine. Use tramadol preferentially to limit delirium. Cannot use NSAIDs due to renal function.  - Diurese today, follow creatinine.  - May require thoracentesis right side.  - Phenylephrine stopped. Continue midodrine. Target SBP >110 - hold metoprolol, amiodarone until off  pressors.  - Transfuse and give Fe supplement.  Have stopped PPI as not home medication and acid suppression might decrease enteral iron absorption.  - ASA, statin, resume Repatha at discharge. - Hold home bronchodilators, see if she really needs them.  - Continue SSI and resume home insulin and oral agents at discharge.   Best Practice (right click and "Reselect all SmartList Selections" daily)   Diet/type: Advance diet.  DVT prophylaxis: SCD - hold on LMWH until tomorrow GI prophylaxis: PPI - home medication.  Lines: Central line and Arterial Line - arterial line will likely come out this afternoon, keep chest tubes as still significant output.  Foley:  Yes, and it is still needed - actively diuresing today.  Code Status:  full code Last date of multidisciplinary goals of care discussion [per primary]  Kipp Brood, MD [facility] ICU Physician [place]  Pager: [phone] Mobile: [phone] After hours: [phone].

## 2023-03-04 NOTE — Discharge Summary (Signed)
[address]       [place] [address]             [phone]    Physician Discharge Summary  Patient ID: Joyce Potter MRN: [id number] DOB/AGE: 1946-10-16 77 y.o.  Admit date: 03/02/2023 Discharge date: 03/15/2023  Admission Diagnoses:  Patient Active Problem List   Diagnosis Date Noted   Acute respiratory failure (HCC) 03/02/2023   Contusion of right arm 02/02/2023   Pain in limb 02/20/2021   AVM (arteriovenous malformation) 01/09/2021   Aortic atherosclerosis (HCC) 08/12/2020   Anemia 07/22/2020   Acute on chronic anemia 07/21/2020   History of blood transfusion 06/27/2020   OSA (obstructive sleep apnea) 06/27/2020   GI bleeding 06/12/2020   Diabetes mellitus without complication (HCC) 05/20/2020   Anemia due to blood loss 05/20/2020   Blood loss anemia    Chronic fatigue 03/21/2020   Type 2 diabetes mellitus, with long-term current use of insulin (HCC) 03/21/2020   Obesity, Class II, BMI 35-39.9 03/21/2020   Snoring 03/21/2020   Background diabetic retinopathy associated with type 2 diabetes mellitus (HCC) 06/06/2019   Uncontrolled type 2 diabetes mellitus with hyperglycemia (HCC) 04/30/2019   Osteoporosis, post-menopausal 04/30/2019   Multiple rib fractures 10/11/2018   GIB (gastrointestinal bleeding) 07/17/2018   Melena    Symptomatic anemia    PVD (peripheral vascular disease) (HCC) 02/11/2017   Iron deficiency anemia due to chronic blood loss 07/13/2016   Multinodular goiter 01/25/2016   Central retinal artery occlusion 10/01/2015   Carotid stenosis 10/01/2015   Stroke (HCC) 09/28/2015   Aortic stenosis 11/29/2014   PUD (peptic ulcer disease) 07/17/2014   HLD (hyperlipidemia) 05/07/2013   GI bleed from an occult source with recurrent chronic blood loss anemia 04/14/2013   CAD (coronary artery disease) 04/14/2013   IDDM (insulin dependent diabetes mellitus) 04/14/2013   HTN (hypertension) 04/14/2013   Chest pain 04/14/2013    Discharge Diagnoses:  Patient Active Problem List   Diagnosis Date Noted   S/P CABG x 2 03/02/2023   Acute respiratory failure (HCC) 03/02/2023   Contusion of right arm 02/02/2023   Pain in limb 02/20/2021   AVM (arteriovenous malformation) 01/09/2021   Aortic atherosclerosis (HCC) 08/12/2020   Anemia 07/22/2020   Acute on chronic anemia 07/21/2020   History of blood transfusion 06/27/2020   OSA (obstructive sleep apnea) 06/27/2020   GI bleeding 06/12/2020   Diabetes mellitus without complication (HCC) 05/20/2020   Anemia due to blood loss 05/20/2020   Blood loss anemia    Chronic fatigue 03/21/2020   Type 2 diabetes mellitus, with long-term current use of insulin (HCC) 03/21/2020   Obesity, Class II, BMI 35-39.9 03/21/2020   Snoring 03/21/2020   Background diabetic retinopathy associated with type 2 diabetes mellitus (HCC) 06/06/2019   Uncontrolled type 2 diabetes mellitus with hyperglycemia (HCC) 04/30/2019   Osteoporosis, post-menopausal 04/30/2019   Multiple rib fractures 10/11/2018   GIB (gastrointestinal bleeding) 07/17/2018   Melena    Symptomatic anemia    PVD (peripheral vascular disease) (HCC) 02/11/2017   Iron deficiency anemia due to chronic blood loss 07/13/2016   Multinodular goiter 01/25/2016   Central retinal artery occlusion 10/01/2015   Carotid stenosis 10/01/2015   Stroke (HCC) 09/28/2015   Aortic stenosis 11/29/2014   PUD (peptic ulcer disease) 07/17/2014   HLD (hyperlipidemia) 05/07/2013   GI bleed from an occult source with recurrent chronic blood loss anemia 04/14/2013   CAD (coronary artery disease) 04/14/2013  IDDM (insulin dependent diabetes mellitus) 04/14/2013   HTN (hypertension) 04/14/2013   Chest pain 04/14/2013     Discharged Condition: good  [name], MD    History of Present Illness:     Pt is a very pleasant 77 yo female who has been having progressive exertional CP and DOE. Pt has progressed to where she is having  these symptoms with minimal exertion and does not have at rest. She has a history of PCI of RCA following angina and has done well till recently. She also has had GI bleeding with resolution after colonoscopy however her iron levels have not stabilized and she requires iron infusions on occasion and her anemia will cause her symptoms to worsen. Her HCT was 30 last checked. 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 She has had a recent work up with echo with severe AS with a mean gradient of . She has normal LV function. She had a cath with significant LAD stenosis with tight Diag stenosis felt to be a difficult PCI if attempted. Pt was felt best served with surgical revascularization and SAVR.  Dr. Leafy Ro reviewed the patient's diagnostic studies and determined surgical intervention would provide this patient the best long term treatment. Dr. Leafy Ro discussed the patient's treatment options as well as the risks and benefits of surgery. Joyce Potter was agreeable to proceed with surgery.  Hospital Course: Joyce Potter arrive at [facility] and was brought to the operating room on 03/02/23. She underwent coronary artery bypass grafting utilizing LIMA to LAD and RSVG to Diagonal as well as endoscoping greater saphenous vein harvest of the right thigh. She also underwent aortic valve replacement utilizing a 23mm Inspiris Resilia valve. She tolerated the procedure without complication and was transferred to the SICU in stable condition.  Postoperative Hospital course:  The patient was extubated on the morning postoperative day I.  She has remained hemodynamically stable and vasopressors and inotropic support were weaned without difficulty.  She has been started on midodrine to assist with blood pressures.  She does have a postoperative expected acute blood loss anemia and  has required transfusion of packed cells.  Additionally she has received iron infusions.  The PCCM service has assisted with ICU management.  She does have a expected postoperative volume overload but is responding well to diuretics.  On postop day 2 she did increase in BUN/creatinine to 26 and 1.36 respectively and values  were monitored closely.  She is started on a course of progressive ambulation using standard cardiac rehab protocols. She does have a right-sided pleural effusion which is being monitored with serial x-rays and may require thoracentesis depending on progression.  Oxygen has been weaned over time and she undergone usual pulmonary hygiene maneuvers. The patient's hypotension improved and she was taken off Midodrine. Beta blocker was held due to hypotension. She was started on diuretics for volume overloaded state. Her chest tubes were removed without complication. She developed atrial fibrillation with a controlled rate, oral Amiodarone was started. She had postoperative blood loss anemia and was transfused with 1 unit of packed red blood cells. Lovenox was held.  She has continued to show steady progress in her physical recovery. She has been evaluated by both Physical and Occupational Therapy who feel she would benefit from a short-term rehabilitation stay in a skilled nursing facility at  time of discharge. This would also help family as her husband has significant dementia and she is one of his primary caretakers. Her creatinine normalized, IV lasix was continued for volume overload state. It has improved and diuretics are stopped at time of d/c.  She was hypertensive, Losartan was titrated as able. Patient had ecchymosis of thigh on right where EVH was performed. She has  small amount of hematoma in the harvest tunnel with very minor  erythema and was put on Doxycycline on 03/17.  This has shown good improvement with time and both pain and appearance. She will receive another 5 days .  Patient  chose facility and will be stable for discharge on 03/19.          Consults: pulmonary/intensive care  Significant Diagnostic Studies:  DG Chest 2 View  Result Date: 03/10/2023 CLINICAL DATA:  Status post CABG x2.  Surgery follow-up. EXAM: CHEST - 2 VIEW COMPARISON:  Chest radiographs 03/08/2023, 03/06/2023, 02/25/2023 FINDINGS: Status post median sternotomy and aortic valve replacement. Cardiac silhouette is again mildly enlarged. Mediastinal contours are within normal limits. Small left pleural effusion tracking up the lateral left pleura. Likely small right pleural effusion with fluid tracking along the right minor fissure. Mild bilateral interstitial thickening. No pneumothorax. Moderate multilevel degenerative disc changes of the thoracic spine. IMPRESSION: 1. Small bilateral pleural effusions, left greater than right. These are similar to prior. 2. Mild bilateral interstitial thickening suggesting mild interstitial pulmonary edema. Electronically Signed   By: Neita Garnet M.D.   On: 03/10/2023 08:31   DG CHEST PORT 1 VIEW  Result Date: 03/08/2023 CLINICAL DATA:  91209 Atelectasis 91209 EXAM: PORTABLE CHEST 1 VIEW COMPARISON:  Chest radiograph from one day prior. FINDINGS: Intact sternotomy wires. Aortic valve prosthesis in place. Stable cardiomediastinal silhouette with mild cardiomegaly. No pneumothorax. Stable small left pleural effusion. No definite right pleural effusion. Mild pulmonary edema, similar to slightly improved. Slightly improved bibasilar patchy opacities favoring atelectasis. Chronic lateral lower left rib fractures again noted. IMPRESSION: 1. Mild congestive heart failure, similar to slightly improved. 2. Stable small left pleural effusion. 3. Slightly improved bibasilar patchy opacities favoring atelectasis. Electronically Signed   By: Delbert Phenix M.D.   On: 03/08/2023 08:12   DG Chest Port 1 View  Result Date: 03/07/2023 CLINICAL DATA:  Atelectasis EXAM: PORTABLE  CHEST 1 VIEW COMPARISON:  Chest radiograph 03/06/2023 FINDINGS: Status post median sternotomy an aortic valve appearance. Right-sided central venous catheter sheath remains. Unchanged small bilateral pleural effusions. Compared to prior exam there is slightly improved aeration of the bilateral lung bases. Cardiac and mediastinal contours are unchanged. Visualized upper abdomen is notable for surgical clips in the left upper quadrant. No radiographically apparent displaced rib fractures. IMPRESSION: Slightly improved aeration of the bilateral lung bases. Electronically Signed   By: Lorenza Cambridge M.D.   On: 03/07/2023 08:16   DG Chest Port 1 View  Result Date: 03/06/2023 CLINICAL DATA:  77 year old female with history of atelectasis. EXAM: PORTABLE CHEST 1 VIEW COMPARISON:  Chest x-ray 03/05/2023. FINDINGS: Right internal jugular central venous catheter with tip terminating in the mid superior vena cava. Previously noted left-sided chest tube and mediastinal/pericardial drains have been removed. Lung volumes are very low. Bibasilar opacities which may reflect areas of atelectasis and/or consolidation. Small bilateral pleural effusions. No definite pneumothorax. No evidence of pulmonary edema. Cardiopericardial silhouette appears mildly enlarged. Upper mediastinal contours are distorted by patient positioning. Status post median sternotomy for aortic valve replacement with what appears to be  a stented bioprosthesis. IMPRESSION: 1. Postoperative changes and support apparatus, as above. 2. Low lung volumes with persistent and worsening bibasilar opacities which may reflect areas of atelectasis and/or consolidation, with superimposed small bilateral pleural effusions. Electronically Signed   By: Trudie Reed M.D.   On: 03/06/2023 08:55   DG Chest Port 1 View  Result Date: 03/05/2023 CLINICAL DATA:  77 year old female with history of cardiac abnormality. EXAM: PORTABLE CHEST 1 VIEW COMPARISON:  Chest x-ray  03/04/2023. FINDINGS: Right internal jugular central venous catheter with tip terminating in the mid superior vena cava. Midline surgical drain projecting over the epigastric region and lower mediastinum, likely mediastinal/pericardial drain. Left-sided chest tube with tip terminating in the upper left hemithorax. Additional surgical drain projecting over the upper abdomen. Status post median sternotomy for aortic valve replacement with what appears to be a stented bioprosthesis. Lung volumes are very low. Bibasilar opacities which may reflect areas of atelectasis and/or consolidation, with superimposed moderate bilateral pleural effusions. No appreciable pneumothorax. There is cephalization of the pulmonary vasculature and slight indistinctness of the interstitial markings suggestive of mild pulmonary edema. Mild cardiomegaly. Atherosclerotic calcifications in the thoracic aorta. IMPRESSION: 1. Postoperative changes and support apparatus, as above. 2. Low lung volumes with bibasilar opacities which likely predominantly reflect moderate bilateral pleural effusions and postoperative subsegmental atelectasis, although underlying airspace consolidation is not excluded. 3. Mild interstitial pulmonary edema. 4. Aortic atherosclerosis. Electronically Signed   By: Trudie Reed M.D.   On: 03/05/2023 08:47   DG CHEST PORT 1 VIEW  Result Date: 03/04/2023 CLINICAL DATA:  Chest tube in place. EXAM: PORTABLE CHEST 1 VIEW COMPARISON:  03/03/2023. FINDINGS: 0511 hours. Interval removal of the endotracheal and enteric tubes. Interval removal of the right IJ approach pulmonary arterial catheter. Unchanged right IJ approach vascular sheath. Unchanged left-sided chest tube and mediastinal drainage catheter. Low lung volumes accentuate the pulmonary vasculature and cardiomediastinal silhouette. Slightly improved pulmonary edema with persistent small bilateral pleural effusions and adjacent bibasilar atelectasis. No pneumothorax.  IMPRESSION: 1. Interval removal of the endotracheal and enteric tubes, as well as the pulmonary arterial catheter. 2. Slightly improved pulmonary edema with persistent small bilateral pleural effusions. Electronically Signed   By: Orvan Falconer M.D.   On: 03/04/2023 08:21   DG Chest Port 1 View  Result Date: 03/03/2023 CLINICAL DATA:  CABG EXAM: PORTABLE CHEST 1 VIEW COMPARISON:  03/02/2023. FINDINGS: Enlarged cardiac silhouette. Moderate bilateral pleural effusions. Pulmonary interstitial prominence consistent with edema. No pneumothorax identified. Endotracheal tube tip at the thoracic inlet. Swan-Ganz catheter tip overlies the left pulmonary artery. Mediastinal drain in place. NG tube tip below the diaphragm and off the x-ray. Left-sided chest tube. Median sternotomy wires. Prosthetic aortic valve. IMPRESSION: CHF.  Postop changes. Electronically Signed   By: Layla Maw M.D.   On: 03/03/2023 08:23   ECHO INTRAOPERATIVE TEE  Result Date: 03/02/2023  *INTRAOPERATIVE TRANSESOPHAGEAL REPORT *  Patient Name:   ASHTYN HATHWAY Date of Exam: 03/02/2023 Medical Rec #:  [id number]        Height:       63.0 in Accession #:    [id number]       Weight:       193.0 lb Date of Birth:  02-Feb-1946       BSA:          1.90 m Patient Age:    76 years         BP:           142/63  mmHg Patient Gender: F                HR:           62 bpm. Exam Location:  Anesthesiology Transesophogeal exam was perform intraoperatively during surgical procedure. Patient was closely monitored under general anesthesia during the entirety of examination. Indications:     CAD Native Vessel i25.10, Aortic Stenosis i35.0 Sonographer:     Irving Burton Senior RDCS Performing Phys: [id number] Eugenio Hoes Diagnosing Phys: Lewie Loron MD Complications: No known complications during this procedure. POST-OP IMPRESSIONS _ Left Ventricle: The left ventricle is unchanged from pre-bypass. _ Right Ventricle: The right ventricle appears unchanged from  pre-bypass. _ Aorta: The aorta appears unchanged from pre-bypass. _ Left Atrial Appendage: The left atrial appendage appears unchanged from pre-bypass. _ Aortic Valve: No stenosis present. A pericardial bioprosthetic valve was placed, leaflets thin and leaflets are freely mobile Size; 23mm. There is trace central regurgitation. The gradient recorded across the prosthetic valve is within the expected range, measuring 99 cm/s. No perivalvular leak noted. _ Tricuspid Valve: The tricuspid valve appears unchanged from pre-bypass. _ Pulmonic Valve: The pulmonic valve appears unchanged from pre-bypass. _ Interatrial Septum: The interatrial septum appears unchanged from pre-bypass. PRE-OP FINDINGS  Left Ventricle: The left ventricle has normal systolic function, with an ejection fraction of 55-60%. The cavity size was normal. There is moderate concentric left ventricular hypertrophy. Right Ventricle: The right ventricle has normal systolic function. The cavity was normal. There is no increase in right ventricular wall thickness. Left Atrium: Left atrial size was dilated. No left atrial/left atrial appendage thrombus was detected. Right Atrium: Right atrial size was normal in size. Interatrial Septum: No atrial level shunt detected by color flow Doppler. Pericardium: There is no evidence of pericardial effusion. Mitral Valve: The mitral valve is normal in structure. Mitral valve regurgitation is mild by color flow Doppler. Tricuspid Valve: The tricuspid valve was normal in structure. Tricuspid valve regurgitation is trivial by color flow Doppler. Aortic Valve: The aortic valve is tricuspid Aortic valve regurgitation is mild by color flow Doppler. The jet is centrally-directed. There is severe stenosis of the aortic valve. There is severe aortic annular calcification noted. There is moderate thickening and moderate calcification present on the aortic valve right coronary, left coronary and non-coronary cusps with severely  decreased mobility. Pulmonic Valve: The pulmonic valve was normal in structure. Pulmonic valve regurgitation is trivial by color flow Doppler. Aorta: There is evidence of plaque in the descending aorta and aortic root; Grade II, measuring 2-19mm in size.  Lewie Loron MD Electronically signed by Lewie Loron MD Signature Date/Time: 03/02/2023/5:26:43 PM    Final    DG CHEST PORT 1 VIEW  Result Date: 03/02/2023 CLINICAL DATA:  Postop. EXAM: PORTABLE CHEST 1 VIEW COMPARISON:  X-ray 03/02/2023 earlier and older FINDINGS: Stable ET tube, enteric tube, mediastinal drain, left chest tube and Swan-Ganz catheter. Enlarged cardiopericardial silhouette with vascular congestion and trace edema. Persistent lung base opacities and small left effusion. No pneumothorax. Overlapping cardiac leads. Chest with prosthetic aortic valve. IMPRESSION: No significant interval change. Electronically Signed   By: Karen Kays M.D.   On: 03/02/2023 16:34   DG Chest Port 1 View  Result Date: 03/02/2023 CLINICAL DATA:  Respiratory failure EXAM: PORTABLE CHEST 1 VIEW COMPARISON:  02/25/2023 FINDINGS: Endotracheal tube seen 2.3 cm above the carina. Nasogastric tube extends into the upper abdomen beyond the margin of the examination. Right internal jugular Swan-Ganz catheter tip noted within the expected  main pulmonary artery. Mediastinal drain and left chest tube are in place. Lung volumes are small and there is bibasilar atelectasis. Small left pleural effusion is present. No pneumothorax. Aortic valve replacement and coronary artery bypass grafting has been performed. Trace perihilar interstitial pulmonary edema. No acute bone abnormality. IMPRESSION: 1. Support lines and tubes in appropriate position. 2. Pulmonary hypoinflation. Small left pleural effusion. 3. Trace perihilar interstitial pulmonary edema. Electronically Signed   By: Helyn Numbers M.D.   On: 03/02/2023 14:34   EP STUDY  Result Date: 03/02/2023 See surgical note for  result.  DG Chest 2 View  Result Date: 02/27/2023 CLINICAL DATA:  [id number] Pre-op chest exam [id number] EXAM: Chest two views COMPARISON:  01/27/2019. FINDINGS: Cardiac silhouette enlarged. No evidence of pneumothorax or pleural effusion. No evidence of pulmonary edema. Aorta is calcified. There are thoracic degenerative changes. IMPRESSION: Enlarged cardiac silhouette.  Lungs are clear. Electronically Signed   By: Layla Maw M.D.   On: 02/27/2023 18:28   VAS [place] ABI WITH/WO TBI  Result Date: 02/23/2023  LOWER EXTREMITY DOPPLER STUDY Patient Name:  JENSON KILIC  Date of Exam:   02/22/2023 Medical Rec #: [id number]         Accession #:    [id number] Date of Birth: 1946/10/04        Patient Gender: F Patient Age:   80 years Exam Location:  [facility] Procedure:      VAS [place] ABI WITH/WO TBI Referring Phys: Festus Barren --------------------------------------------------------------------------------  Indications: Peripheral artery disease, and neuropathy rt foot pain. High Risk Factors: Hypertension.  Vascular Interventions: None. Comparison Study: 02/19/2022 Performing Technologist: Debbe Bales RVS  Examination Guidelines: A complete evaluation includes at minimum, Doppler waveform signals and systolic blood pressure reading at the level of bilateral brachial, anterior tibial, and posterior tibial arteries, when vessel segments are accessible. Bilateral testing is considered an integral part of a complete examination. Photoelectric Plethysmograph (PPG) waveforms and toe systolic pressure readings are included as required and additional duplex testing as needed. Limited examinations for reoccurring indications may be performed as noted.  ABI Findings: +---------+------------------+-----+---------+--------+ Right    Rt Pressure (mmHg)IndexWaveform Comment  +---------+------------------+-----+---------+--------+ Brachial 183                                       +---------+------------------+-----+---------+--------+ ATA      176               0.95 triphasic         +---------+------------------+-----+---------+--------+ PTA      195               1.05 triphasic         +---------+------------------+-----+---------+--------+ Great Toe173               0.93 Normal            +---------+------------------+-----+---------+--------+ +---------+------------------+-----+---------+-------+ Left     Lt Pressure (mmHg)IndexWaveform Comment +---------+------------------+-----+---------+-------+ Brachial 186                                     +---------+------------------+-----+---------+-------+ ATA      154               0.83 biphasic         +---------+------------------+-----+---------+-------+ PTA      201  1.08 triphasic        +---------+------------------+-----+---------+-------+ Great Toe218               1.17 Normal           +---------+------------------+-----+---------+-------+ +-------+-----------+-----------+------------+------------+ ABI/TBIToday's ABIToday's TBIPrevious ABIPrevious TBI +-------+-----------+-----------+------------+------------+ Right  1.05       .93        1.11        .85          +-------+-----------+-----------+------------+------------+ Left   1.08       1.17       1.09        .92          +-------+-----------+-----------+------------+------------+  Bilateral ABIs appear essentially unchanged compared to prior study on 02/19/2022. Bilateral TBIs appear increased compared to prior study on 02/19/2022.  Summary: Right: Resting right ankle-brachial index is within normal range. The right toe-brachial index is normal. Left: Resting left ankle-brachial index is within normal range. The left toe-brachial index is normal. *See table(s) above for measurements and observations.  Electronically signed by Festus Barren MD on 02/23/2023 at 2:27:23 PM.    Final    VAS US  CAROTID  Result Date: 02/23/2023 Carotid Arterial Duplex Study Patient Name:  LORA SANKEY  Date of Exam:   02/22/2023 Medical Rec #: [id number]         Accession #:    [id number] Date of Birth: [birth year]        Patient Gender: F Patient Age:   9 years Exam Location:  [facility] Procedure:      VAS US CAROTID Referring Phys: Festus Barren --------------------------------------------------------------------------------  Indications:       Carotid artery disease. Comparison Study:  02/19/2022 Performing Technologist: Debbe Bales RVS  Examination Guidelines: A complete evaluation includes B-mode imaging, spectral Doppler, color Doppler, and power Doppler as needed of all accessible portions of each vessel. Bilateral testing is considered an integral part of a complete examination. Limited examinations for reoccurring indications may be performed as noted.  Right Carotid Findings: +----------+--------+--------+--------+------------------+--------+           PSV cm/sEDV cm/sStenosisPlaque DescriptionComments +----------+--------+--------+--------+------------------+--------+ CCA Prox  61      13                                         +----------+--------+--------+--------+------------------+--------+ CCA Mid   46      12                                         +----------+--------+--------+--------+------------------+--------+ CCA Distal58      12                                         +----------+--------+--------+--------+------------------+--------+ ICA Prox  56      13                                         +----------+--------+--------+--------+------------------+--------+ ICA Mid   62      17                                         +----------+--------+--------+--------+------------------+--------+  ICA Distal80      26                                         +----------+--------+--------+--------+------------------+--------+ ECA       215      0                                          +----------+--------+--------+--------+------------------+--------+ +----------+--------+-------+--------+-------------------+           PSV cm/sEDV cmsDescribeArm Pressure (mmHG) +----------+--------+-------+--------+-------------------+ Subclavian125     0                                  +----------+--------+-------+--------+-------------------+ +---------+--------+--+--------+-+ VertebralPSV cm/s29EDV cm/s6 +---------+--------+--+--------+-+  Left Carotid Findings: +----------+--------+--------+--------+------------------+--------+           PSV cm/sEDV cm/sStenosisPlaque DescriptionComments +----------+--------+--------+--------+------------------+--------+ CCA Prox  60      17                                         +----------+--------+--------+--------+------------------+--------+ CCA Mid   68      17                                         +----------+--------+--------+--------+------------------+--------+ CCA Distal71      16                                         +----------+--------+--------+--------+------------------+--------+ ICA Prox  41      17                                         +----------+--------+--------+--------+------------------+--------+ ICA Mid   109     35                                         +----------+--------+--------+--------+------------------+--------+ ICA Distal91      25                                         +----------+--------+--------+--------+------------------+--------+ ECA       64      0                                          +----------+--------+--------+--------+------------------+--------+ +----------+--------+--------+--------+-------------------+           PSV cm/sEDV cm/sDescribeArm Pressure (mmHG) +----------+--------+--------+--------+-------------------+ Subclavian104     0                                    +----------+--------+--------+--------+-------------------+ +---------+--------+--+--------+-+  VertebralPSV cm/s44EDV cm/s9 +---------+--------+--+--------+-+   Summary: Right Carotid: Velocities in the right ICA are consistent with a 1-39% stenosis. Left Carotid: Velocities in the left ICA are consistent with a 1-39% stenosis. Vertebrals:  Bilateral vertebral arteries demonstrate antegrade flow. Subclavians: Normal flow hemodynamics were seen in bilateral subclavian              arteries. *See table(s) above for measurements and observations.  Electronically signed by Festus Barren MD on 02/23/2023 at 2:26:52 PM.    Final    CT Chest Wo Contrast  Result Date: 02/17/2023 CLINICAL DATA:  Thoracic aortic disease. Pretreatment planning for AVR. EXAM: CT CHEST WITHOUT CONTRAST TECHNIQUE: Multidetector CT imaging of the chest was performed following the standard protocol without IV contrast. RADIATION DOSE REDUCTION: This exam was performed according to the departmental dose-optimization program which includes automated exposure control, adjustment of the mA and/or kV according to patient size and/or use of iterative reconstruction technique. COMPARISON:  07/26/2019 FINDINGS: Cardiovascular: The heart size is normal. No substantial pericardial effusion. Coronary artery calcification is evident. Mild atherosclerotic calcification is noted in the wall of the thoracic aorta. Ascending thoracic aorta measures 3.3 cm diameter. Aortic valve calcification evident. Mitral annular calcification evident. Mediastinum/Nodes: No mediastinal lymphadenopathy. No evidence for gross hilar lymphadenopathy although assessment is limited by the lack of intravenous contrast on the current study. The esophagus has normal imaging features. There is no axillary lymphadenopathy. Lungs/Pleura: 4 mm right middle lobe nodule on 65/4 is stable. No followup imaging is recommended. No new suspicious pulmonary nodule or mass. Scarring noted  inferior left lower lobe in a region adjacent to multiple nonacute left rib fractures. A 2 mm peripheral left upper lobe nodule on 32/4 is unchanged as is a 2-3 mm left lower lobe nodule on 73/4. No followup imaging is recommended. No focal airspace consolidation. There is no evidence of pleural effusion. Upper Abdomen: Visualized portion of the upper abdomen is unremarkable. Musculoskeletal: No worrisome lytic or sclerotic osseous abnormality. IMPRESSION: 1. No acute findings in the chest. 2. Aortic atherosclerosis with evidence of aortic valve calcification and mitral annular calcification. 3. Stable tiny bilateral pulmonary nodules, consistent with benign etiology. No followup imaging is recommended. 4. Multiple nonacute left rib fractures with adjacent scarring in the left lower lobe. 5.  Aortic Atherosclerosis (ICD10-I70.0). Electronically Signed   By: Kennith Center M.D.   On: 02/17/2023 10:29       Results for orders placed or performed during the hospital encounter of 03/02/23 (from the past 48 hour(s))  Glucose, capillary     Status: Abnormal   Collection Time: 03/13/23 12:26 PM  Result Value Ref Range   Glucose-Capillary 199 (H) 70 - 99 mg/dL    Comment: Glucose reference range applies only to samples taken after fasting for at least 8 hours.  Glucose, capillary     Status: Abnormal   Collection Time: 03/13/23  4:47 PM  Result Value Ref Range   Glucose-Capillary 237 (H) 70 - 99 mg/dL    Comment: Glucose reference range applies only to samples taken after fasting for at least 8 hours.  Glucose, capillary     Status: Abnormal   Collection Time: 03/13/23  7:41 PM  Result Value Ref Range   Glucose-Capillary 246 (H) 70 - 99 mg/dL    Comment: Glucose reference range applies only to samples taken after fasting for at least 8 hours.   Comment 1 Notify RN    Comment 2 Document in Chart   Glucose, capillary  Status: Abnormal   Collection Time: 03/14/23  5:57 AM  Result Value Ref Range    Glucose-Capillary 136 (H) 70 - 99 mg/dL    Comment: Glucose reference range applies only to samples taken after fasting for at least 8 hours.  Glucose, capillary     Status: Abnormal   Collection Time: 03/14/23 12:34 PM  Result Value Ref Range   Glucose-Capillary 151 (H) 70 - 99 mg/dL    Comment: Glucose reference range applies only to samples taken after fasting for at least 8 hours.  Glucose, capillary     Status: Abnormal   Collection Time: 03/14/23  5:36 PM  Result Value Ref Range   Glucose-Capillary 277 (H) 70 - 99 mg/dL    Comment: Glucose reference range applies only to samples taken after fasting for at least 8 hours.  Glucose, capillary     Status: Abnormal   Collection Time: 03/14/23  9:11 PM  Result Value Ref Range   Glucose-Capillary 201 (H) 70 - 99 mg/dL    Comment: Glucose reference range applies only to samples taken after fasting for at least 8 hours.   Comment 1 Notify RN    Comment 2 Document in Chart   Basic metabolic panel     Status: Abnormal   Collection Time: 03/15/23  1:11 AM  Result Value Ref Range   Sodium 131 (L) 135 - 145 mmol/L   Potassium 4.8 3.5 - 5.1 mmol/L   Chloride 96 (L) 98 - 111 mmol/L   CO2 24 22 - 32 mmol/L   Glucose, Bld 230 (H) 70 - 99 mg/dL    Comment: Glucose reference range applies only to samples taken after fasting for at least 8 hours.   BUN 28 (H) 8 - 23 mg/dL   Creatinine, Ser 0.63 (H) 0.44 - 1.00 mg/dL   Calcium 9.0 8.9 - 01.6 mg/dL   GFR, Estimated 54 (L) >60 mL/min    Comment: (NOTE) Calculated using the CKD-EPI Creatinine Equation (2021)    Anion gap 11 5 - 15    Comment: Performed at [facility], [address] [place], [place] [address]  Glucose, capillary     Status: Abnormal   Collection Time: 03/15/23  5:49 AM  Result Value Ref Range   Glucose-Capillary 234 (H) 70 - 99 mg/dL    Comment: Glucose reference range applies only to samples taken after fasting for at least 8 hours.   Comment 1 Notify RN    Comment 2  Document in Chart     Treatments: surgery:  03/02/2023 TERAN SCHUTTLER [id number]   Surgeon:  Ashley Akin, MD   First Assistant: Aloha Gell [facility]                                  Preoperative Diagnosis:  Aortic Stenosis                                             Coronary artery disease   Postoperative Diagnosis:  Same     Procedure: Aortic Valve Replacement with a 23mm Inspiris Pericardial valve (SN [id number])                      Coronary artery bypass x 2  with the LIMA to the LAD and  RSVG from the aorta to the Diagonal coronary artery       Anesthesia:  General Endotracheal  Discharge Exam: Blood pressure (!) 132/48, pulse 76, temperature 98.4 F (36.9 C), temperature source Oral, resp. rate 20, height 5\' 3"  (1.6 m), weight 87.7 kg, SpO2 90 %.     General appearance: alert, cooperative, and no distress Heart: regular rate and rhythm and occas extrasystole Lungs: dim in bases L>R Abdomen: benign Extremities: no edema Wound: echymosis is improved, may have some blood in tunnel, no increase in warmth or erethema   Discharge Medications:  The patient has been discharged on:   1.Beta Blocker:  Yes [  y ]                              No   [   ]                              If No, reason:  2.Ace Inhibitor/ARB: Yes [   y]                                     No  [    ]                                     If No, reason:  3.Statin:   Yes [   ]                  No  [  n ]                  If No, reason:on repatha  4.Marlowe KaysValentino Hue  [  y ]                  No   [   ]                  If No, reason:  Patient had ACS upon admission:n  Plavix/P2Y12 inhibitor: Yes [   ]                                      No  [  n ]     Discharge Instructions     Amb Referral to Cardiac Rehabilitation   Complete by: As directed    Diagnosis:  CABG Valve Replacement     Valve: Aortic   CABG X ___: 2   After initial evaluation and assessments completed: Virtual Based Care  may be provided alone or in conjunction with Phase 2 Cardiac Rehab based on patient barriers.: Yes   Intensive Cardiac Rehabilitation (ICR) [facility] location only OR Traditional Cardiac Rehabilitation (TCR) *If criteria for ICR are not met will enroll in TCR [facility] only): Yes   Discharge patient   Complete by: As directed    Discharge disposition: 03-Skilled Nursing Facility   Discharge patient date: 03/15/2023      Allergies as of 03/15/2023       Reactions   Sulfa Antibiotics Other (See Comments)   Rash and flushing   Invokamet [canagliflozin-metformin Hcl] Other (See Comments)   Yeast Infection  Lovastatin Rash   Penicillins Rash   Has patient had a PCN reaction causing immediate rash, facial/tongue/throat swelling, SOB or lightheadedness with hypotension:Yes Has patient had a PCN reaction causing severe rash involving mucus membranes or skin necrosis:all over body Has patient had a PCN reaction that required hospitalization: No Has patient had a PCN reaction occurring within the last 10 years: No If all of the above answers are "NO", then may proceed with Cephalosporin use.   Vicodin [hydrocodone-acetaminophen] Rash        Medication List     STOP taking these medications    isosorbide mononitrate 60 MG 24 hr tablet Commonly known as: IMDUR   traMADol 50 MG tablet Commonly known as: ULTRAM       TAKE these medications    albuterol 108 (90 Base) MCG/ACT inhaler Commonly known as: VENTOLIN HFA Inhale 2 puffs into the lungs every 4 (four) hours as needed for wheezing or shortness of breath.   amiodarone 200 MG tablet Commonly known as: PACERONE Take 1 tablet (200 mg total) by mouth daily.   amitriptyline 25 MG tablet Commonly known as: ELAVIL Take 25 mg by mouth at bedtime as needed for sleep.   aspirin EC 325 MG tablet Take 1 tablet (325 mg total) by mouth daily. What changed:  medication strength how much to take additional instructions   BD Insulin Syringe  U/F 31G X 5/16" 1 ML Misc Generic drug: Insulin Syringe-Needle U-100   doxycycline 100 MG tablet Commonly known as: VIBRA-TABS Take 1 tablet (100 mg total) by mouth every 12 (twelve) hours.   Dulaglutide 3 MG/0.5ML Sopn Inject 3 mg into the skin every Saturday.   insulin NPH-regular Human (70-30) 100 UNIT/ML injection Inject 30 Units into the skin with breakfast, with lunch, and with evening meal.   lactobacillus acidophilus Tabs tablet Take 2 tablets by mouth 3 (three) times daily.   losartan 100 MG tablet Commonly known as: COZAAR Take 1 tablet (100 mg total) by mouth daily. What changed:  medication strength how much to take   metoprolol succinate 100 MG 24 hr tablet Commonly known as: TOPROL-XL Take 1 tablet (100 mg total) by mouth daily. Take with or immediately following a meal. What changed:  how much to take when to take this additional instructions   Multi-Vitamin tablet Take 1 tablet by mouth daily.   OneTouch Verio test strip Generic drug: glucose blood   oxyCODONE 5 MG immediate release tablet Commonly known as: Oxy IR/ROXICODONE Take 1 tablet (5 mg total) by mouth every 6 (six) hours as needed for up to 7 days for severe pain.   pantoprazole 40 MG tablet Commonly known as: PROTONIX Take 40 mg by mouth daily.   Repatha SureClick 140 MG/ML Soaj Generic drug: Evolocumab Inject 140 mg into the skin every 14 (fourteen) days.   sertraline 25 MG tablet Commonly known as: Zoloft Take 1 tablet (25 mg total) by mouth daily.   Spiriva HandiHaler 18 MCG inhalation capsule Generic drug: tiotropium Place 18 mcg into inhaler and inhale at bedtime.   Vitamin A 2400 MCG (8000 UT) Caps Take 8,000 Units by mouth daily.        Follow-up Information     Eugenio Hoes, MD Follow up.   Specialty: Cardiothoracic Surgery Why: Please see discharge paperwork for details of follow-up appointment with your surgeon.    Benay Pillow information: [address] [place] [address] [phone]  [facility] Follow up.   Why: Please obtain a chest x-ray 1 hour prior to your appointment at surgeons office from [facility] imaging. Contact information: [address] [place] [address]        [name], MD Follow up.   Specialty: Cardiology Why: Please see discharge paperwork for follow-up appointment with cardiology. Contact information: [address] [place] [address] [phone]               1. Please obtain vital signs at least one time daily 2.Please weigh the patient daily. If he or she continues to gain weight or develops lower extremity edema, contact the office at [phone]. 3. Ambulate patient at least three times daily and please use sternal precautions.  Signed:  Rowe Clack, PA-C  03/15/2023, 8:04 AM

## 2023-03-04 NOTE — Progress Notes (Signed)
Pt refused CPAP use at this time.

## 2023-03-04 NOTE — Progress Notes (Signed)
      [address]       [place] [address]             [phone]     POD # 2 AVR CABG Up in chair  BP (!) 113/40 (BP Location: Right Arm)   Pulse 60   Temp 98 F (36.7 C) (Oral)   Resp (!) 24   Ht 5\' 3"  (1.6 m)   Wt 97.7 kg   SpO2 97%   BMI 38.15 kg/m   Intake/Output Summary (Last 24 hours) at 03/04/2023 1708 Last data filed at 03/04/2023 1600 Gross per 24 hour  Intake 1110.49 ml  Output 1609 ml  Net -498.51 ml   Dumped about 250 ml from CT when getting up  Hgb 8.1 up from  6.9 post transfusion  Joyce Lipps C. Roxan Hockey, MD [facility] [phone]

## 2023-03-04 NOTE — Progress Notes (Signed)
TCTS Progress Note: 2 Days Post-Op Procedure(s) (LRB): CORONARY ARTERY BYPASS GRAFTING (CABG) TIMES THREE USING THE LEFT INTERNAL MAMMARY ARTERY (LIMA) AND ENDOSCOPICALLY HARVESTED RIGHT GREATER SAPHENOUS VEIN (N/A) AORTIC VALVE REPLACEMENT (AVR) USING EDWARDS INSPIRIS RESILIA 23 MM AORTIC VALVE (N/A) TRANSESOPHAGEAL ECHOCARDIOGRAM (N/A)  LOS: 2 days   Seen this AM Conversational Doesn't seem in pain On neo at 25 XR has more bilateral lung edema than expected  N appropriate CV good hemodynamics, wean neo as tolerated 1u blood today to get off pressors, follow w diuretics R nasal cannula, XR wet GI: no issues, diet GU: 40-60/hr uop. Cr up to 1.3 from less than 1. 1u blood should help 40 lasix IV BID ordered.  Heme:  On ASA 325.  Would hold off on plavix or lovenox today as CT output has been significant Attention tomorrow AM to start either plavix (as PWW CABG start this typically day 1 I'm told by CVTS team) or lovenox as a shorting acting anticoagulant if CT output still sig.  ID: s/p periop abx WC 11 Endo: no issues T/L /D: keep Cts today      Latest Ref Rng & Units 03/04/2023    4:32 AM 03/03/2023    5:00 PM 03/03/2023    7:08 AM  CBC  WBC 4.0 - 10.5 K/uL 11.4  7.9    Hemoglobin 12.0 - 15.0 g/dL 6.9  7.2  6.8   Hematocrit 36.0 - 46.0 % 20.3  21.1  20.0   Platelets 150 - 400 K/uL 119  93         Latest Ref Rng & Units 03/04/2023    4:32 AM 03/03/2023    5:00 PM 03/03/2023    7:08 AM  CMP  Glucose 70 - 99 mg/dL 166  135    BUN 8 - 23 mg/dL 26  21    Creatinine 0.44 - 1.00 mg/dL 1.36  1.09    Sodium 135 - 145 mmol/L 139  138  140   Potassium 3.5 - 5.1 mmol/L 4.2  3.7  4.1   Chloride 98 - 111 mmol/L 107  108    CO2 22 - 32 mmol/L 24  25    Calcium 8.9 - 10.3 mg/dL 7.8  7.7      ABG    Component Value Date/Time   PHART 7.362 03/03/2023 0708   PCO2ART 44.9 03/03/2023 0708   PO2ART 77 (L) 03/03/2023 0708   HCO3 25.6 03/03/2023 0708   TCO2 27 03/03/2023 0708    ACIDBASEDEF 1.0 03/02/2023 1511   O2SAT 79.3 03/03/2023 0921

## 2023-03-05 ENCOUNTER — Inpatient Hospital Stay (HOSPITAL_COMMUNITY): Payer: Medicare HMO

## 2023-03-05 DIAGNOSIS — I350 Nonrheumatic aortic (valve) stenosis: Secondary | ICD-10-CM | POA: Diagnosis not present

## 2023-03-05 DIAGNOSIS — J9601 Acute respiratory failure with hypoxia: Secondary | ICD-10-CM | POA: Diagnosis not present

## 2023-03-05 LAB — BASIC METABOLIC PANEL
Anion gap: 7 (ref 5–15)
Anion gap: 12 (ref 5–15)
BUN: 42 mg/dL — ABNORMAL HIGH (ref 8–23)
BUN: 49 mg/dL — ABNORMAL HIGH (ref 8–23)
CO2: 27 mmol/L (ref 22–32)
CO2: 22 mmol/L (ref 22–32)
Calcium: 8.1 mg/dL — ABNORMAL LOW (ref 8.9–10.3)
Calcium: 8.1 mg/dL — ABNORMAL LOW (ref 8.9–10.3)
Chloride: 104 mmol/L (ref 98–111)
Chloride: 104 mmol/L (ref 98–111)
Creatinine, Ser: 1.96 mg/dL — ABNORMAL HIGH (ref 0.44–1.00)
Creatinine, Ser: 1.9 mg/dL — ABNORMAL HIGH (ref 0.44–1.00)
GFR, Estimated: 26 mL/min — ABNORMAL LOW (ref >60)
GFR, Estimated: 27 mL/min — ABNORMAL LOW (ref >60)
Glucose, Bld: 176 mg/dL — ABNORMAL HIGH (ref 70–99)
Glucose, Bld: 208 mg/dL — ABNORMAL HIGH (ref 70–99)
Potassium: 4.4 mmol/L (ref 3.5–5.1)
Potassium: 4 mmol/L (ref 3.5–5.1)
Sodium: 138 mmol/L (ref 135–145)
Sodium: 138 mmol/L (ref 135–145)

## 2023-03-05 LAB — CBC
HCT: 22.7 % — ABNORMAL LOW (ref 36.0–46.0)
Hemoglobin: 7.5 g/dL — ABNORMAL LOW (ref 12.0–15.0)
MCH: 29.4 pg (ref 26.0–34.0)
MCHC: 33 g/dL (ref 30.0–36.0)
MCV: 89 fL (ref 80.0–100.0)
Platelets: 111 10*3/uL — ABNORMAL LOW (ref 150–400)
RBC: 2.55 MIL/uL — ABNORMAL LOW (ref 3.87–5.11)
RDW: 19.6 % — ABNORMAL HIGH (ref 11.5–15.5)
WBC: 9.9 10*3/uL (ref 4.0–10.5)
nRBC: 0.2 % (ref 0.0–0.2)

## 2023-03-05 LAB — BPAM RBC
Blood Product Expiration Date: 202403132359
Blood Product Expiration Date: 202403132359
Blood Product Expiration Date: 202404022359
Blood Product Expiration Date: 202404022359
Blood Product Expiration Date: 202404022359
Blood Product Expiration Date: 202404042359
ISSUE DATE / TIME: 202403060955
ISSUE DATE / TIME: 202403060955
ISSUE DATE / TIME: 202403061553
ISSUE DATE / TIME: 202403061839
ISSUE DATE / TIME: 202403070730
ISSUE DATE / TIME: 202403080645
Unit Type and Rh: 5100
Unit Type and Rh: 5100
Unit Type and Rh: 5100
Unit Type and Rh: 5100
Unit Type and Rh: 5100
Unit Type and Rh: 5100

## 2023-03-05 LAB — GLUCOSE, CAPILLARY
Glucose-Capillary: 172 mg/dL — ABNORMAL HIGH (ref 70–99)
Glucose-Capillary: 149 mg/dL — ABNORMAL HIGH (ref 70–99)
Glucose-Capillary: 172 mg/dL — ABNORMAL HIGH (ref 70–99)
Glucose-Capillary: 158 mg/dL — ABNORMAL HIGH (ref 70–99)
Glucose-Capillary: 136 mg/dL — ABNORMAL HIGH (ref 70–99)
Glucose-Capillary: 143 mg/dL — ABNORMAL HIGH (ref 70–99)

## 2023-03-05 LAB — TYPE AND SCREEN
ABO/RH(D): O POS
Antibody Screen: NEGATIVE
Unit division: 0
Unit division: 0
Unit division: 0
Unit division: 0
Unit division: 0
Unit division: 0

## 2023-03-05 LAB — COMPREHENSIVE METABOLIC PANEL
ALT: 9 U/L (ref 0–44)
AST: 22 U/L (ref 15–41)
Albumin: 2.8 g/dL — ABNORMAL LOW (ref 3.5–5.0)
Alkaline Phosphatase: 41 U/L (ref 38–126)
Anion gap: 13 (ref 5–15)
BUN: 44 mg/dL — ABNORMAL HIGH (ref 8–23)
CO2: 21 mmol/L — ABNORMAL LOW (ref 22–32)
Calcium: 8.2 mg/dL — ABNORMAL LOW (ref 8.9–10.3)
Chloride: 106 mmol/L (ref 98–111)
Creatinine, Ser: 1.99 mg/dL — ABNORMAL HIGH (ref 0.44–1.00)
GFR, Estimated: 26 mL/min — ABNORMAL LOW (ref >60)
Glucose, Bld: 202 mg/dL — ABNORMAL HIGH (ref 70–99)
Potassium: 4.3 mmol/L (ref 3.5–5.1)
Sodium: 140 mmol/L (ref 135–145)
Total Bilirubin: 0.8 mg/dL (ref 0.3–1.2)
Total Protein: 5.5 g/dL — ABNORMAL LOW (ref 6.5–8.1)

## 2023-03-05 LAB — COOXEMETRY PANEL
Carboxyhemoglobin: 2.4 % — ABNORMAL HIGH (ref 0.5–1.5)
Methemoglobin: <0.7 % (ref 0.0–1.5)
O2 Saturation: 75.8 %
Total hemoglobin: 7.9 g/dL — ABNORMAL LOW (ref 12.0–16.0)

## 2023-03-05 MED ORDER — INSULIN DETEMIR 100 UNIT/ML ~~LOC~~ SOLN
30.0000 [IU] | Freq: Two times a day (BID) | SUBCUTANEOUS | Status: DC
  Administered 2023-03-05 (×2): 30 [IU] via SUBCUTANEOUS
  Filled 2023-03-05 (×4): qty 0.3

## 2023-03-05 MED ORDER — INSULIN ASPART 100 UNIT/ML IJ SOLN
0.0000 [IU] | Freq: Three times a day (TID) | INTRAMUSCULAR | Status: DC
  Administered 2023-03-05 – 2023-03-06 (×4): 2 [IU] via SUBCUTANEOUS

## 2023-03-05 MED ORDER — BOOST PLUS PO LIQD
237.0000 mL | Freq: Three times a day (TID) | ORAL | Status: DC
  Administered 2023-03-05 – 2023-03-15 (×22): 237 mL via ORAL
  Filled 2023-03-05 (×31): qty 237

## 2023-03-05 MED ORDER — INSULIN ASPART 100 UNIT/ML IJ SOLN
0.0000 [IU] | Freq: Every day | INTRAMUSCULAR | Status: DC
  Administered 2023-03-08: 2 [IU] via SUBCUTANEOUS

## 2023-03-05 MED ORDER — PANTOPRAZOLE SODIUM 40 MG PO TBEC
40.0000 mg | DELAYED_RELEASE_TABLET | Freq: Every day | ORAL | Status: DC
  Administered 2023-03-05 – 2023-03-15 (×11): 40 mg via ORAL
  Filled 2023-03-05 (×11): qty 1

## 2023-03-05 NOTE — Progress Notes (Signed)
      [address]       [place] [address]             [phone]      POD # 3  Sleeping  BP (!) 150/56   Pulse 80   Temp 98.7 F (37.1 C) (Oral)   Resp 19   Ht 5\' 3"  (1.6 m)   Wt 96.3 kg   SpO2 92%   BMI 37.61 kg/m    Intake/Output Summary (Last 24 hours) at 03/05/2023 1749 Last data filed at 03/05/2023 1400 Gross per 24 hour  Intake 510 ml  Output 1045 ml  Net -535 ml   Co-ox 76 earlier today  Stable day so far, continue current care  [place] C. Roxan Hockey, MD [facility] [phone]

## 2023-03-05 NOTE — Progress Notes (Signed)
Pt refused CPAP use at this time.

## 2023-03-05 NOTE — Progress Notes (Signed)
Initial Nutrition Assessment  DOCUMENTATION CODES:    (unable to assess)  INTERVENTION:   Boost Plus po TID, each supplement provides 360 kcal and 14 grams of protein  Liberalize diet to REGULAR until appetite and po intake improve  Continue MVI with Minerals  If pt continues without BM, recommend adjusting bowel regimen   NUTRITION DIAGNOSIS:   Inadequate oral intake related to nausea, poor appetite, acute illness as evidenced by meal completion < 25%, per patient/family report.  GOAL:   Patient will meet greater than or equal to 90% of their needs  MONITOR:   PO intake, Supplement acceptance, Labs, Weight trends  REASON FOR ASSESSMENT:   Consult Poor PO  ASSESSMENT:   77 yo female with CAD and severe aortic stenosis and admitted for CABG x 2, AVR. PMH includes CAD, severe aortic stenosis, carotid stenosis, HLD, HTN, DM, OSA, PUD, anemia  3/06 CABG x 2, significant post op bleeding, eventually stopped 3/07 Extubated, CL diet 3/08 Diet advanced to Carb Mod  Currently on 4L Coffee, pt with postop atelectasis  Diet advanced to Carb Modified yesterday before breakfast, recorded po intake only 20-30% of meals. Spoke with RN who indicates pt eating bites today at best. Pt likes Chocolate Boost, plan to order TID for now  Pt reports food tastes terrible, no appetite. +nausea yesterday but ok today. Noted no BM since admission, bowel regimen scheduled. +flatus, BS, abdomen soft per RN  Current wt 96 kg (212 pounds); based on weight history, no recent wt loss, +wt gain on admission. Noted some edema per RN exam. Unsure dry weight at this time.   Labs: CBGs 149-172, BUN 44, Creatinine 1.99 Meds: colace, dulcolax, lasix, ss novolog, levemir, MVI with Minerals, protonix, tr   NUTRITION - FOCUSED PHYSICAL EXAM:  Unable to assess  Diet Order:   Diet Order             Diet Carb Modified Fluid consistency: Thin; Room service appropriate? Yes  Diet effective now                    EDUCATION NEEDS:   Not appropriate for education at this time  Skin:  Skin Assessment: Skin Integrity Issues:  Last BM:  PTA  Height:   Ht Readings from Last 1 Encounters:  03/02/23 '5\' 3"'$  (1.6 m)    Weight:   Wt Readings from Last 1 Encounters:  03/05/23 96.3 kg    BMI:  Body mass index is 37.61 kg/m.  Estimated Nutritional Needs:   Kcal:  1600-1800 kcals  Protein:  75-85 g  Fluid:  1.6-1.8 L   Kerman Passey MS, RDN, LDN, CNSC Registered Dietitian 3 Clinical Nutrition RD Pager and On-Call Pager Number Located in [place]

## 2023-03-05 NOTE — TOC Initial Note (Signed)
Transition of Care [facility]) - Initial/Assessment Note    Patient Details  Name: Joyce Potter MRN: [id number] Date of Birth: May 18, 1946  Transition of Care [facility]) CM/SW Contact:    Erenest Rasher, RN Phone Number: [phone] 03/05/2023, 9:11 AM  Clinical Narrative:                 CM spoke to pt's dtr, Peter Congo. States pt lives in home with her husband. She is his caregiver. Explained CM will continue to follow for dc needs. Waiting PT/OT recommendations. Pt has RW, cane and was driving to her appts prior to hospital stay.   Expected Discharge Plan: [name] Barriers to Discharge: Continued Medical Work up   Patient Goals and CMS Choice Patient states their goals for this hospitalization and ongoing recovery are:: wants mother to recover CMS Medicare.gov Compare Post Acute Care list provided to:: Patient Represenative (must comment)        Expected Discharge Plan and Services   Discharge Planning Services: CM Consult   Living arrangements for the past 2 months: [place]                                      Prior Living Arrangements/Services Living arrangements for the past 2 months: Single Family Home Lives with:: Spouse Patient language and need for interpreter reviewed:: Yes Do you feel safe going back to the place where you live?: Yes      Need for Family Participation in Patient Care: Yes (Comment)     Criminal Activity/Legal Involvement Pertinent to Current Situation/Hospitalization: No - Comment as needed  Activities of Daily Living      Permission Sought/Granted Permission sought to share information with : Case Manager, PCP, Family Supports Permission granted to share information with : Yes, Verbal Permission Granted  Share Information with NAME: Malachi Bonds     Permission granted to share info w Relationship: daughter  Permission granted to share info w Contact Information: [phone]  Emotional  Assessment Appearance:: Appears stated age Attitude/Demeanor/Rapport: Lethargic          Admission diagnosis:  S/P CABG x 2 [Z95.1] Patient Active Problem List   Diagnosis Date Noted   S/P CABG x 2 03/02/2023   Acute respiratory failure ([place]) 03/02/2023   Contusion of right arm 02/02/2023   Pain in limb 02/20/2021   AVM (arteriovenous malformation) 01/09/2021   Aortic atherosclerosis ([place]) 08/12/2020   Anemia 07/22/2020   Acute on chronic anemia 07/21/2020   History of blood transfusion 06/27/2020   OSA (obstructive sleep apnea) 06/27/2020   GI bleeding 06/12/2020   Diabetes mellitus without complication ([place]) [id number]   Anemia due to blood loss 05/20/2020   Blood loss anemia    Chronic fatigue 03/21/2020   Type 2 diabetes mellitus, with long-term current use of insulin ([place]) 03/21/2020   Obesity, Class II, BMI 35-39.9 03/21/2020   Snoring 03/21/2020   Background diabetic retinopathy associated with type 2 diabetes mellitus ([place]) 06/06/2019   Uncontrolled type 2 diabetes mellitus with hyperglycemia ([place]) 04/30/2019   Osteoporosis, post-menopausal 04/30/2019   Multiple rib fractures 10/11/2018   GIB (gastrointestinal bleeding) 07/17/2018   Melena    Symptomatic anemia    PVD (peripheral vascular disease) ([place]) 02/11/2017   Iron deficiency anemia due to chronic blood loss 07/13/2016   Multinodular goiter 01/25/2016   Central retinal artery occlusion 10/01/2015  Carotid stenosis 10/01/2015   Stroke ([place]) 09/28/2015   Aortic stenosis 11/29/2014   PUD (peptic ulcer disease) 07/17/2014   HLD (hyperlipidemia) 05/07/2013   GI bleed from an occult source with recurrent chronic blood loss anemia 04/14/2013   CAD (coronary artery disease) 04/14/2013   IDDM (insulin dependent diabetes mellitus) 04/14/2013   HTN (hypertension) 04/14/2013   Chest pain 04/14/2013   PCP:  Lin Givens, MD Pharmacy:   [facility] [address] ([address]), [place] - [place]) [place] [address] Phone: [phone] Fax: [phone]     Social Determinants of Health (SDOH) Social History: SDOH Screenings   Food Insecurity: No Food Insecurity (02/18/2022)  Housing: Low Risk  (12/16/2021)  Transportation Needs: No Transportation Needs (05/31/2022)  Alcohol Screen: Low Risk  (12/16/2021)  Depression (PHQ2-9): Low Risk  (05/31/2022)  Financial Resource Strain: Low Risk  (05/31/2022)  Social Connections: Socially Integrated (12/16/2021)  Stress: No Stress Concern Present (02/18/2022)  Tobacco Use: Medium Risk (03/03/2023)   SDOH Interventions:     Readmission Risk Interventions     No data to display

## 2023-03-05 NOTE — Progress Notes (Signed)
3 Days Post-Op Procedure(s) (LRB): CORONARY ARTERY BYPASS GRAFTING (CABG) TIMES THREE USING THE LEFT INTERNAL MAMMARY ARTERY ([place]) AND ENDOSCOPICALLY HARVESTED RIGHT GREATER SAPHENOUS VEIN (N/A) AORTIC VALVE REPLACEMENT (AVR) USING EDWARDS INSPIRIS RESILIA 23 MM AORTIC VALVE (N/A) TRANSESOPHAGEAL ECHOCARDIOGRAM (N/A) Subjective: Does not feel well  Objective: Vital signs in last 24 hours: Temp:  [97.8 F (36.6 C)-98 F (36.7 C)] 97.9 F (36.6 C) (03/09 0400) Pulse Rate:  [59-70] 68 (03/09 0600) Cardiac Rhythm: Normal sinus rhythm (03/09 0400) Resp:  [11-42] 42 (03/09 0600) BP: (102-137)/(37-98) 137/41 (03/09 0600) SpO2:  [89 %-100 %] 90 % (03/09 0600) Arterial Line BP: (90-155)/(32-47) 145/46 (03/09 0600) Weight:  [96.3 kg] 96.3 kg (03/09 0500)  Hemodynamic parameters for last 24 hours:    Intake/Output from previous day: 03/08 0701 - 03/09 0700 In: 899 [P.O.:450; I.V.:74; Blood:375] Out: [id number] [Urine:765; Drains:250; Chest Tube:750] Intake/Output this shift: No intake/output data recorded.  General appearance: cooperative, no distress, and slowed mentation Neurologic: generally weak, no focal deficit Heart: regular rate and rhythm Lungs: diminished breath sounds bibasilar Abdomen: normal findings: soft, non-tender Extremities: edematous  Lab Results: Recent Labs    03/04/23 0432 03/04/23 1051 03/05/23 0354  WBC 11.4*  --  9.9  HGB 6.9* 8.1* 7.5*  HCT 20.3* 24.3* 22.7*  PLT 119*  --  111*   BMET:  Recent Labs    03/04/23 0432 03/05/23 0354  NA 139 138  K 4.2 4.4  CL 107 104  CO2 24 27  GLUCOSE 166* 176*  BUN 26* 42*  CREATININE 1.36* 1.96*  CALCIUM 7.8* 8.1*    PT/INR:  Recent Labs    03/02/23 1818  LABPROT 18.1*  INR 1.5*   ABG    Component Value Date/Time   PHART 7.362 03/03/2023 0708   HCO3 25.6 03/03/2023 0708   TCO2 27 03/03/2023 0708   ACIDBASEDEF 1.0 03/02/2023 1511   O2SAT 79.3 03/03/2023 0921   CBG (last 3)  Recent Labs     03/04/23 2321 03/05/23 0114 03/05/23 0408  GLUCAP 143* 172* 149*    Assessment/Plan: S/P Procedure(s) (LRB): CORONARY ARTERY BYPASS GRAFTING (CABG) TIMES THREE USING THE LEFT INTERNAL MAMMARY ARTERY ([place]) AND ENDOSCOPICALLY HARVESTED RIGHT GREATER SAPHENOUS VEIN (N/A) AORTIC VALVE REPLACEMENT (AVR) USING EDWARDS INSPIRIS RESILIA 23 MM AORTIC VALVE (N/A) TRANSESOPHAGEAL ECHOCARDIOGRAM (N/A) POD # 3, slow to progress NEURO- history of stroke, no focal deficit but generally slow and weak CV- in Sr with PACs  Hypotension improved- dc midodrine  Will hold off on starting beta blocker  Check co-ox RESP- severe Bibasilar atelectasis- IS RENAL- creatinine up to 1.96 c/w acute kidney injury  Will check co-ox  Monitor renal function  Maintain BP  Still fluid overloaded- continue Lasix for now GI- PO intake fair ENDO- CBG elevated  Add Levemir, change SSI to [facility] and HS Anemia- secondary to ABL, improved post transfusion  Monitor Thrombocytopenia- PLT relatively stable, monitor Mobilize as tolerated  LOS: 3 days    [place] Nakayama 03/05/2023

## 2023-03-05 NOTE — Progress Notes (Signed)
NAME:  Joyce Potter, MRN:  [id number], DOB:  31-Dec-1945, LOS: 3 ADMISSION DATE:  03/02/2023, CONSULTATION DATE:  3/6 REFERRING MD:  Dr. Lavonna Monarch, CHIEF COMPLAINT:  AVR w/ CABG   History of Present Illness:  Patient is a 77 year old female with pertinent PMH CAD (stent placed in 2012), severe aortic stenosis, carotid stenosis, HLD, HTN, IDDM, OSA, asthma, PUD, anemia with hx of gi bleeding requiring iron transfusions presents to [facility] ED on 3/6 for AVR w/ CABG.  Patient seen by cardiology on January 2024 for further eval of continual chest pain and SOB with exertion since July 2023. Patient had recently received a cardiac catheterization in July 2023 showing diffuse moderate disease and high-grade disease of the long diagonal.  Patient was treated with medical therapy. EKG showing normal rate with nonspecific ST abnormality.  Echo 12/2022 LVEF 60 to 65%; severe aortic valve stenosis.  Patient evaluated by T CTS and plan for AVR with CABG.  On 3/6 patient presents to [facility] for AVR with CABG x2.  S/p procedure patient intubated/sedated.  PCCM consulted.  Pertinent  Medical History   Past Medical History:  Diagnosis Date   ADHD (attention deficit hyperactivity disorder)    Anemia    iron deficiency   Anxiety    Aortic stenosis    Arthritis    Asthma    Basal cell carcinoma    CAD (coronary artery disease)    s/p Left circumflex stent in 2012   Carotid stenosis    Cataract    Heart murmur    High cholesterol    HTN (hypertension)    Hyperlipidemia    IDDM (insulin dependent diabetes mellitus)    Multiple thyroid nodules    Benign   Neuromuscular disorder (HCC)    Peptic ulcer disease    Pneumonia 1952   PONV (postoperative nausea and vomiting)    Retinal artery occlusion    on left   Sleep apnea    Stroke ([place]) 2018   ischemic     Significant Hospital Events: Including procedures, antibiotic start and stop dates in addition to other pertinent events   3/6 CABG - significant  post operative oozing. Bleeding eventually stopped.  3/7 Extubated.   Interim History / Subjective:  Remain lethargic, stated could not sleep well last night Remains on 4 L nasal cannula oxygen  Objective   Blood pressure (!) 137/41, pulse 68, temperature 97.9 F (36.6 C), temperature source Oral, resp. rate (!) 42, height '5\' 3"'$  (1.6 m), weight 96.3 kg, SpO2 90 %.        Intake/Output Summary (Last 24 hours) at 03/05/2023 0841 Last data filed at 03/05/2023 0700 Gross per 24 hour  Intake 799.02 ml  Output 1615 ml  Net -815.98 ml   Filed Weights   03/03/23 0600 03/04/23 0500 03/05/23 0500  Weight: 97.1 kg 97.7 kg 96.3 kg    Examination: Physical exam: General: Acute on chronically ill-appearing female, lying on the recliner HEENT: [place]/AT, eyes anicteric.  moist mucus membranes Neuro: Lethargic, opens eyes with vocal stimuli, generalized weak, following simple commands Chest: Air entry at the bases bilaterally, no wheezes or rhonchi Heart: Regular rate and rhythm, no murmurs or gallops Abdomen: Soft, nontender, nondistended, bowel sounds present Skin: No rash  Labs and images were reviewed Serum creatinine continue to trend up  Assessment & Plan:  Acute hypoxic respiratory failure due to severe postop atelectasis Currently on 4 L nasal cannula oxygen Encourage incentive spirometry Ambulate with assistance Titrate oxygen with  O2 sat goal 92%  Coronary artery disease s/p CABG x 2 Severe aortic stenosis s/p AVR Continue pain control with Tylenol, tramadol and oxycodone Hold beta-blocker Continue diuresis Continue aspirin Still has chest tubes, central line and Foley catheter, defer to primary care team Monitor chest tube output  Acute blood loss anemia on anemia of chronic disease Status post blood transfusion Hemoglobin is stable around 7.4 Monitor H&H and transfuse less than 7  Acute kidney injury due to ischemic ATN Patient became hypotensive. Now blood pressure  has improved Serum creatinine continue to trend up Monitor intake and output Avoid nephrotoxic agents  HTN Blood pressure has improved Continue to hold antihypertensive Discontinue midodrine.  Diabetes type 2 Patient's hemoglobin A1c 7.3 Continue Levemir and sliding scale insulin with CBG goal 140-180  Morbid obesity Dietitian is following   Best Practice (right click and "Reselect all SmartList Selections" daily)   Diet/type: Consistent carbohydrate diet DVT prophylaxis: SCD - hold on LMWH GI prophylaxis: PPI Lines: Central line and Arterial Line per primary team Foley:  Yes, and it is still needed - actively diuresing today. Code Status:  full code Last date of multidisciplinary goals of care discussion [per primary]  This patient is critically ill with multiple organ system failure which requires frequent high complexity decision making, assessment, support, evaluation, and titration of therapies. This was completed through the application of advanced monitoring technologies and extensive interpretation of multiple databases.  During this encounter critical care time was devoted to patient care services described in this note for 33 minutes.    Jacky Kindle, MD [facility] See Amion for pager If no response to pager, please call [phone] until 7pm After 7pm, Please call E-link [phone]

## 2023-03-06 ENCOUNTER — Inpatient Hospital Stay (HOSPITAL_COMMUNITY): Payer: Medicare HMO

## 2023-03-06 DIAGNOSIS — J9601 Acute respiratory failure with hypoxia: Secondary | ICD-10-CM | POA: Diagnosis not present

## 2023-03-06 DIAGNOSIS — I25118 Atherosclerotic heart disease of native coronary artery with other forms of angina pectoris: Secondary | ICD-10-CM | POA: Diagnosis not present

## 2023-03-06 DIAGNOSIS — I350 Nonrheumatic aortic (valve) stenosis: Secondary | ICD-10-CM | POA: Diagnosis not present

## 2023-03-06 LAB — POCT I-STAT 7, (LYTES, BLD GAS, ICA,H+H)
Acid-base deficit: 3 mmol/L — ABNORMAL HIGH (ref 0.0–2.0)
Bicarbonate: 22.5 mmol/L (ref 20.0–28.0)
Calcium, Ion: 1.15 mmol/L (ref 1.15–1.40)
HCT: 22 % — ABNORMAL LOW (ref 36.0–46.0)
Hemoglobin: 7.5 g/dL — ABNORMAL LOW (ref 12.0–15.0)
O2 Saturation: 93 %
Patient temperature: 97.8
Potassium: 4.3 mmol/L (ref 3.5–5.1)
Sodium: 139 mmol/L (ref 135–145)
TCO2: 24 mmol/L (ref 22–32)
pCO2 arterial: 40.4 mmHg (ref 32–48)
pH, Arterial: 7.352 (ref 7.35–7.45)
pO2, Arterial: 68 mmHg — ABNORMAL LOW (ref 83–108)

## 2023-03-06 LAB — BASIC METABOLIC PANEL
Anion gap: 6 (ref 5–15)
BUN: 50 mg/dL — ABNORMAL HIGH (ref 8–23)
CO2: 26 mmol/L (ref 22–32)
Calcium: 8 mg/dL — ABNORMAL LOW (ref 8.9–10.3)
Chloride: 107 mmol/L (ref 98–111)
Creatinine, Ser: 1.59 mg/dL — ABNORMAL HIGH (ref 0.44–1.00)
GFR, Estimated: 34 mL/min — ABNORMAL LOW (ref >60)
Glucose, Bld: 134 mg/dL — ABNORMAL HIGH (ref 70–99)
Potassium: 3.8 mmol/L (ref 3.5–5.1)
Sodium: 139 mmol/L (ref 135–145)

## 2023-03-06 LAB — CBC
HCT: 20.9 % — ABNORMAL LOW (ref 36.0–46.0)
HCT: 25.7 % — ABNORMAL LOW (ref 36.0–46.0)
Hemoglobin: 6.9 g/dL — CL (ref 12.0–15.0)
Hemoglobin: 8.4 g/dL — ABNORMAL LOW (ref 12.0–15.0)
MCH: 29.9 pg (ref 26.0–34.0)
MCH: 28.8 pg (ref 26.0–34.0)
MCHC: 33 g/dL (ref 30.0–36.0)
MCHC: 32.7 g/dL (ref 30.0–36.0)
MCV: 90.5 fL (ref 80.0–100.0)
MCV: 88 fL (ref 80.0–100.0)
Platelets: 140 10*3/uL — ABNORMAL LOW (ref 150–400)
Platelets: 153 10*3/uL (ref 150–400)
RBC: 2.31 MIL/uL — ABNORMAL LOW (ref 3.87–5.11)
RBC: 2.92 MIL/uL — ABNORMAL LOW (ref 3.87–5.11)
RDW: 19.6 % — ABNORMAL HIGH (ref 11.5–15.5)
RDW: 19 % — ABNORMAL HIGH (ref 11.5–15.5)
WBC: 7.8 10*3/uL (ref 4.0–10.5)
WBC: 8.4 10*3/uL (ref 4.0–10.5)
nRBC: 0.3 % — ABNORMAL HIGH (ref 0.0–0.2)
nRBC: 0.2 % (ref 0.0–0.2)

## 2023-03-06 LAB — MAGNESIUM: Magnesium: 2.6 mg/dL — ABNORMAL HIGH (ref 1.7–2.4)

## 2023-03-06 LAB — PREPARE RBC (CROSSMATCH)

## 2023-03-06 LAB — GLUCOSE, CAPILLARY
Glucose-Capillary: 113 mg/dL — ABNORMAL HIGH (ref 70–99)
Glucose-Capillary: 116 mg/dL — ABNORMAL HIGH (ref 70–99)
Glucose-Capillary: 150 mg/dL — ABNORMAL HIGH (ref 70–99)
Glucose-Capillary: 128 mg/dL — ABNORMAL HIGH (ref 70–99)
Glucose-Capillary: 52 mg/dL — ABNORMAL LOW (ref 70–99)
Glucose-Capillary: 53 mg/dL — ABNORMAL LOW (ref 70–99)
Glucose-Capillary: 59 mg/dL — ABNORMAL LOW (ref 70–99)
Glucose-Capillary: 85 mg/dL (ref 70–99)

## 2023-03-06 LAB — COOXEMETRY PANEL
Carboxyhemoglobin: 1.8 % — ABNORMAL HIGH (ref 0.5–1.5)
Methemoglobin: <0.7 % (ref 0.0–1.5)
O2 Saturation: 66.2 %
Total hemoglobin: 7.2 g/dL — ABNORMAL LOW (ref 12.0–16.0)

## 2023-03-06 MED ORDER — METOPROLOL TARTRATE 25 MG PO TABS
25.0000 mg | ORAL_TABLET | Freq: Two times a day (BID) | ORAL | Status: DC
  Administered 2023-03-06 – 2023-03-07 (×3): 25 mg via ORAL
  Filled 2023-03-06 (×3): qty 1

## 2023-03-06 MED ORDER — SODIUM CHLORIDE 0.9% IV SOLUTION: Freq: Once | INTRAVENOUS | Status: AC

## 2023-03-06 MED ORDER — DEXTROSE 10 % IV SOLN: INTRAVENOUS | Status: DC

## 2023-03-06 MED ORDER — INSULIN DETEMIR 100 UNIT/ML ~~LOC~~ SOLN
40.0000 [IU] | Freq: Every day | SUBCUTANEOUS | Status: DC
  Filled 2023-03-06: qty 0.4

## 2023-03-06 MED ORDER — SENNA 8.6 MG PO TABS
2.0000 | ORAL_TABLET | Freq: Every day | ORAL | Status: DC
  Administered 2023-03-06 – 2023-03-08 (×3): 17.2 mg via ORAL
  Filled 2023-03-06 (×3): qty 2

## 2023-03-06 MED ORDER — POLYETHYLENE GLYCOL 3350 17 G PO PACK
17.0000 g | PACK | Freq: Every day | ORAL | Status: DC
  Administered 2023-03-06 – 2023-03-14 (×6): 17 g via ORAL
  Filled 2023-03-06 (×9): qty 1

## 2023-03-06 MED ORDER — INSULIN DETEMIR 100 UNIT/ML ~~LOC~~ SOLN: 15.0000 [IU] | Freq: Once | SUBCUTANEOUS | Status: DC

## 2023-03-06 MED ORDER — INSULIN DETEMIR 100 UNIT/ML ~~LOC~~ SOLN
30.0000 [IU] | Freq: Every day | SUBCUTANEOUS | Status: DC
  Filled 2023-03-06 (×2): qty 0.3

## 2023-03-06 MED ORDER — AMIODARONE HCL 200 MG PO TABS
200.0000 mg | ORAL_TABLET | Freq: Two times a day (BID) | ORAL | Status: DC
  Administered 2023-03-06 – 2023-03-07 (×4): 200 mg via ORAL
  Filled 2023-03-06 (×4): qty 1

## 2023-03-06 MED ORDER — INSULIN DETEMIR 100 UNIT/ML ~~LOC~~ SOLN
40.0000 [IU] | Freq: Two times a day (BID) | SUBCUTANEOUS | Status: DC
  Administered 2023-03-06: 40 [IU] via SUBCUTANEOUS
  Filled 2023-03-06 (×2): qty 0.4

## 2023-03-06 NOTE — Progress Notes (Signed)
4 Days Post-Op Procedure(s) (LRB): CORONARY ARTERY BYPASS GRAFTING (CABG) TIMES THREE USING THE LEFT INTERNAL MAMMARY ARTERY ([place]) AND ENDOSCOPICALLY HARVESTED RIGHT GREATER SAPHENOUS VEIN (N/A) AORTIC VALVE REPLACEMENT (AVR) USING EDWARDS INSPIRIS RESILIA 23 MM AORTIC VALVE (N/A) TRANSESOPHAGEAL ECHOCARDIOGRAM (N/A) Subjective: Feels a little better overall, but feels irregular heart beat  Objective: Vital signs in last 24 hours: Temp:  [97.6 F (36.4 C)-98.7 F (37.1 C)] 98.6 F (37 C) (03/10 0400) Pulse Rate:  [63-104] 71 (03/10 0600) Cardiac Rhythm: Normal sinus rhythm (03/09 2000) Resp:  [12-27] 17 (03/10 0600) BP: (110-173)/(43-95) 141/56 (03/10 0600) SpO2:  [77 %-100 %] 100 % (03/10 0600) Arterial Line BP: (96-188)/(34-60) 150/47 (03/10 0600) Weight:  [96.1 kg] 96.1 kg (03/10 0600)  Hemodynamic parameters for last 24 hours:    Intake/Output from previous day: 03/09 0701 - 03/10 0700 In: 360 [P.O.:360] Out: 1225 [Urine:1205; Chest Tube:20] Intake/Output this shift: No intake/output data recorded.  General appearance: alert, cooperative, and no distress Neurologic: intact Heart: irregularly irregular rhythm Lungs: diminished breath sounds bibasilar Abdomen: normal findings: soft, non-tender  Lab Results: Recent Labs    03/05/23 0354 03/06/23 0349  WBC 9.9 7.8  HGB 7.5* 6.9*  HCT 22.7* 20.9*  PLT 111* 140*   BMET:  Recent Labs    03/05/23 1735 03/06/23 0349  NA 138 139  K 4.0 3.8  CL 104 107  CO2 22 26  GLUCOSE 208* 134*  BUN 49* 50*  CREATININE 1.90* 1.59*  CALCIUM 8.1* 8.0*    PT/INR: No results for input(s): "LABPROT", "INR" in the last 72 hours. ABG    Component Value Date/Time   PHART 7.362 03/03/2023 0708   HCO3 25.6 03/03/2023 0708   TCO2 27 03/03/2023 0708   ACIDBASEDEF 1.0 03/02/2023 1511   O2SAT 66.2 03/06/2023 0348   CBG (last 3)  Recent Labs    03/05/23 2125 03/06/23 0627 03/06/23 0740  GLUCAP 143* 113* 116*     Assessment/Plan: S/P Procedure(s) (LRB): CORONARY ARTERY BYPASS GRAFTING (CABG) TIMES THREE USING THE LEFT INTERNAL MAMMARY ARTERY ([place]) AND ENDOSCOPICALLY HARVESTED RIGHT GREATER SAPHENOUS VEIN (N/A) AORTIC VALVE REPLACEMENT (AVR) USING EDWARDS INSPIRIS RESILIA 23 MM AORTIC VALVE (N/A) TRANSESOPHAGEAL ECHOCARDIOGRAM (N/A) POD # 4 NEURO- no focal deficit CV- in atrial fib with controlled rate this AM'  Co-ox 66  Start PO amiodarone  Not on beta blocker- will start if RVR occurs  ASA RESP- CXR shows some improvement in atelectasis  Continue IS RENAL- creatinine down to 1.6  K = 3,8  Continue diuresis for fluid overload GI- tolerating diet ENDO- CBG still elevated- will increase levemir  SSI AC and HS Anemia secondary to ABL- Hgb < 7   Will transfuse 1 unit SCD for DVT prophylaxis  LOS: 4 days    [place] Nakayama 03/06/2023

## 2023-03-06 NOTE — Evaluation (Signed)
Physical Therapy Evaluation Patient Details Name: Joyce Potter MRN: [id number] DOB: 09/25/1946 Today's Date: 03/06/2023  History of Present Illness  Pt is 77 year old presented to [facility] on  03/02/23 for CABG x 2 and AVR. PMH - CAD, severe aortic stenosis, HTN, DM, lt eye central blindness, GI bleed  Clinical Impression  Pt admitted with above diagnosis and presents to PT with functional limitations due to deficits listed below (See PT problem list). Pt needs skilled PT to maximize independence and safety to allow discharge to SNF. Pt lives with spouse who has dementia and pt serves as caregiver. Pt states that spouse is in some kind of respite care and only has 5-6 more days of respite care. She feels they don't have any options but for him to return home so she feels she needs to return home to care for him. I pointed out that she can't care for herself at this point. She appears open to SNF if care for her spouse can be worked out.         Recommendations for follow up therapy are one component of a multi-disciplinary discharge planning process, led by the attending physician.  Recommendations may be updated based on patient status, additional functional criteria and insurance authorization.  Follow Up Recommendations Skilled nursing-short term rehab (<3 hours/day) Can patient physically be transported by private vehicle: No    Assistance Recommended at Discharge Frequent or constant Supervision/Assistance  Patient can return home with the following  A little help with walking and/or transfers;A little help with bathing/dressing/bathroom;Assistance with cooking/housework;Assist for transportation;Help with stairs or ramp for entrance    Equipment Recommendations None recommended by PT  Recommendations for Other Services  OT consult    Functional Status Assessment Patient has had a recent decline in their functional status and demonstrates the ability to make significant improvements in  function in a reasonable and predictable amount of time.     Precautions / Restrictions Precautions Precautions: Sternal;Fall Precaution Booklet Issued: Yes (comment)      Mobility  Bed Mobility Overal bed mobility: Needs Assistance Bed Mobility: Supine to Sit, Sit to Supine     Supine to sit: Mod assist Sit to supine: +2 for physical assistance, Max assist   General bed mobility comments: Assist to bring legs off of bed, elevate trunk into sitting, and bring hips to EOB. Attempted to have pt go from sit to sidelying but pt lying straight back and required +2 max assist to lower/direct trunk and bring legs up into bed    Transfers Overall transfer level: Needs assistance Equipment used: Rolling walker (2 wheels) Transfers: Sit to/from Stand Sit to Stand: Mod assist           General transfer comment: Assist to power up and for balance. Pt with slight rt lean. Verbal/tactile cues for hands to knees for sternal precautions.    Ambulation/Gait             Pre-gait activities: Side stepped up side of bed with mod assist using walker    Stairs            Wheelchair Mobility    Modified Rankin (Stroke Patients Only)       Balance Overall balance assessment: Needs assistance Sitting-balance support: Bilateral upper extremity supported, Feet supported Sitting balance-Leahy Scale: Poor Sitting balance - Comments: UE support Postural control: Right lateral lean Standing balance support: Bilateral upper extremity supported Standing balance-Leahy Scale: Poor Standing balance comment: walker and min asisst for  static standing                             Pertinent Vitals/Pain Pain Assessment Pain Assessment: Faces Faces Pain Scale: Hurts little more Pain Location: chest with coughing Pain Descriptors / Indicators: Grimacing, Guarding Pain Intervention(s): Limited activity within patient's tolerance, Monitored during session, Repositioned     Home Living Family/patient expects to be discharged to:: Private residence Living Arrangements: Spouse/significant other (has dementia and pt has been his caregiver. In respite care for short term but per pt running out of days.)   Type of Home: Mobile home Home Access: Stairs to enter Entrance Stairs-Rails: Psychiatric nurse of Steps: 4   Home Layout: One level Home Equipment: Rollator (4 wheels)      Prior Function Prior Level of Function : Independent/Modified Independent             Mobility Comments: Uses rollator. Denies falls in last 6 months       Hand Dominance        Extremity/Trunk Assessment   Upper Extremity Assessment Upper Extremity Assessment: Defer to OT evaluation    Lower Extremity Assessment Lower Extremity Assessment: Generalized weakness       Communication   Communication: No difficulties  Cognition Arousal/Alertness: Awake/alert Behavior During Therapy: WFL for tasks assessed/performed Overall Cognitive Status: Within Functional Limits for tasks assessed                                          General Comments General comments (skin integrity, edema, etc.): HR 90-100's. SpO2 high 80's to low 90's on 4L O2    Exercises     Assessment/Plan    PT Assessment Patient needs continued PT services  PT Problem List Decreased strength;Decreased activity tolerance;Decreased balance;Decreased mobility;Decreased knowledge of precautions;Pain       PT Treatment Interventions DME instruction;Gait training;Stair training;Functional mobility training;Therapeutic activities;Balance training;Therapeutic exercise;Patient/family education    PT Goals (Current goals can be found in the Care Plan section)  Acute Rehab PT Goals Patient Stated Goal: to return home PT Goal Formulation: With patient Time For Goal Achievement: 03/20/23 Potential to Achieve Goals: Good    Frequency Min 3X/week      Co-evaluation               AM-PAC PT "6 Clicks" Mobility  Outcome Measure Help needed turning from your back to your side while in a flat bed without using bedrails?: A Lot Help needed moving from lying on your back to sitting on the side of a flat bed without using bedrails?: A Lot Help needed moving to and from a bed to a chair (including a wheelchair)?: A Lot Help needed standing up from a chair using your arms (e.g., wheelchair or bedside chair)?: A Lot Help needed to walk in hospital room?: Total Help needed climbing 3-5 steps with a railing? : Total 6 Click Score: 10    End of Session Equipment Utilized During Treatment: Gait belt;Oxygen Activity Tolerance: Patient tolerated treatment well Patient left: in bed;with call bell/phone within reach;with nursing/sitter in room Nurse Communication: Mobility status PT Visit Diagnosis: Unsteadiness on feet (R26.81);Other abnormalities of gait and mobility (R26.89);Muscle weakness (generalized) (M62.81)    Time: 0932-1000 PT Time Calculation (min) (ACUTE ONLY): 28 min   Charges:   PT Evaluation $PT Eval Moderate Complexity: 1 Mod  PT Treatments $Therapeutic Activity: 8-22 mins        [place] [facility] [place] 03/06/2023, 10:16 AM

## 2023-03-06 NOTE — Progress Notes (Signed)
      [address]       [place] [address]             [phone]      Stable day  BP (!) 150/51   Pulse 92   Temp 99.8 F (37.7 C) (Oral)   Resp 18   Ht 5\' 3"  (1.6 m)   Wt 96.1 kg   SpO2 100%   BMI 37.53 kg/m    Intake/Output Summary (Last 24 hours) at 03/06/2023 1828 Last data filed at 03/06/2023 1800 Gross per 24 hour  Intake 915 ml  Output 1500 ml  Net -585 ml   Has been persistently hypertensive today Will start Lopressor 25 mg BID (was on 100-200 mg Toprol at home)  Remo Lipps C. Roxan Hockey, MD [facility] [phone]

## 2023-03-06 NOTE — Progress Notes (Signed)
eLink Physician-Brief Progress Note Patient Name: Joyce Potter DOB: April 23, 1946 MRN: [id number]   Date of Service  03/06/2023  HPI/Events of Note  Hypoglycemia - Blood glucose - 52. Now blood glucose = 85 after orange juice, soda and Boost.   eICU Interventions  Plan: Decrease Levemir dose to 15 units tonight. D10W IV infusion at 40 mL/hour. POC CBG Q 2 hours until blood glucose > 100 X 2.  Bedside nurse to contact TCTS to be sure that they do not object to this course of management.     Intervention Category Major Interventions: Other:  Lysle Dingwall 03/06/2023, 10:28 PM

## 2023-03-06 NOTE — Progress Notes (Signed)
NAME:  Joyce Potter, MRN:  [id number], DOB:  [birth year], LOS: 4 ADMISSION DATE:  03/02/2023, CONSULTATION DATE:  3/6 REFERRING MD:  Dr. Lavonna Monarch, CHIEF COMPLAINT:  AVR w/ CABG   History of Present Illness:  Patient is a 77 year old female with pertinent PMH CAD (stent placed in 2012), severe aortic stenosis, carotid stenosis, HLD, HTN, IDDM, OSA, asthma, PUD, anemia with hx of gi bleeding requiring iron transfusions presents to [facility] ED on 3/6 for AVR w/ CABG.  Patient seen by cardiology on January 2024 for further eval of continual chest pain and SOB with exertion since July 2023. Patient had recently received a cardiac catheterization in July 2023 showing diffuse moderate disease and high-grade disease of the long diagonal.  Patient was treated with medical therapy. EKG showing normal rate with nonspecific ST abnormality.  Echo 12/2022 LVEF 60 to 65%; severe aortic valve stenosis.  Patient evaluated by T CTS and plan for AVR with CABG.  On 3/6 patient presents to [facility] for AVR with CABG x2.  S/p procedure patient intubated/sedated.  PCCM consulted.  Pertinent  Medical History   Past Medical History:  Diagnosis Date   ADHD (attention deficit hyperactivity disorder)    Anemia    iron deficiency   Anxiety    Aortic stenosis    Arthritis    Asthma    Basal cell carcinoma    CAD (coronary artery disease)    s/p Left circumflex stent in 2012   Carotid stenosis    Cataract    Heart murmur    High cholesterol    HTN (hypertension)    Hyperlipidemia    IDDM (insulin dependent diabetes mellitus)    Multiple thyroid nodules    Benign   Neuromuscular disorder (HCC)    Peptic ulcer disease    Pneumonia 1952   PONV (postoperative nausea and vomiting)    Retinal artery occlusion    on left   Sleep apnea    Stroke ([facility]) 2018   ischemic     Significant Hospital Events: Including procedures, antibiotic start and stop dates in addition to other pertinent events   3/6 CABG - significant  post operative oozing. Bleeding eventually stopped.  3/7 Extubated.   Interim History / Subjective:  Patient went into A-fib with RVR heart rate in 1 point disease Remained afebrile Still requiring 4 to 5 L oxygen via nasal cannula Hemoglobin dropped to 6.9  Objective   Blood pressure (!) 141/56, pulse 71, temperature 98.6 F (37 C), temperature source Oral, resp. rate 17, height '5\' 3"'$  (1.6 m), weight 96.1 kg, SpO2 100 %.        Intake/Output Summary (Last 24 hours) at 03/06/2023 0825 Last data filed at 03/06/2023 0600 Gross per 24 hour  Intake 360 ml  Output 1155 ml  Net -795 ml   Filed Weights   03/04/23 0500 03/05/23 0500 03/06/23 0600  Weight: 97.7 kg 96.3 kg 96.1 kg    Examination: Physical exam: General: Acute on chronically ill-appearing elderly female, lying on the bed, on nasal cannula oxygen HEENT: [place]/AT, eyes anicteric.  moist mucus membranes Neuro: Alert, awake following commands Chest: Bilateral faint basal crackles, no wheezes or rhonchi Heart: Irregularly irregular, tachycardic, no gallops Abdomen: Soft, nontender, nondistended, bowel sounds present Skin: No rash  Labs and images were reviewed Serum creatinine is improving Hemoglobin dropped down to 6.9  Assessment & Plan:  Acute hypoxic respiratory failure due to severe postop atelectasis Continue to require 4 to 5 L oxygen via  nasal cannula Using incentive spirometry Ambulate with assistance Titrate oxygen with O2 sat goal 92%  Coronary artery disease s/p CABG x 2 Severe aortic stenosis s/p AVR Paroxysmal A-fib with RVR Continue pain control with Tylenol, tramadol and oxycodone Patient into A-fib with RVR this morning with heart rate in 120s Started on amiodarone per discussion with TCTS Continue diuresis Continue aspirin Chest tubes were removed yesterday Still has central line and Foley catheter  Acute blood loss anemia on anemia of chronic disease Thrombocytopenia Status post blood  transfusion Hemoglobin dropped down to 6.9 again Patient will get 1 unit PRBC Monitor H&H and transfuse less than 7 Platelet counts are improving  Acute kidney injury due to ischemic ATN Serum creatinine started improving down to 1.6 Monitor intake and output Avoid nephrotoxic agents  HTN Blood pressure has improved Continue to hold antihypertensive for now  Diabetes type 2 Blood sugars are controlled Patient's hemoglobin A1c 7.3 Continue Levemir and sliding scale insulin with CBG goal 140-180  Morbid obesity Dietitian is following   Best Practice (right click and "Reselect all SmartList Selections" daily)   Diet/type: Consistent carbohydrate diet DVT prophylaxis: SCD - hold on LMWH due to ongoing anemia GI prophylaxis: PPI Lines: Central line and Arterial Line per primary team Foley:  Yes, and it is still needed - actively diuresing today. Code Status:  full code Last date of multidisciplinary goals of care discussion [per primary]  This patient is critically ill with multiple organ system failure which requires frequent high complexity decision making, assessment, support, evaluation, and titration of therapies. This was completed through the application of advanced monitoring technologies and extensive interpretation of multiple databases.  During this encounter critical care time was devoted to patient care services described in this note for 31 minutes.    Jacky Kindle, MD [facility] See Amion for pager If no response to pager, please call [phone] until 7pm After 7pm, Please call E-link [phone]

## 2023-03-06 NOTE — Discharge Instructions (Signed)

## 2023-03-07 ENCOUNTER — Inpatient Hospital Stay: Payer: Medicare HMO

## 2023-03-07 ENCOUNTER — Inpatient Hospital Stay (HOSPITAL_COMMUNITY): Payer: Medicare HMO

## 2023-03-07 DIAGNOSIS — Z951 Presence of aortocoronary bypass graft: Secondary | ICD-10-CM

## 2023-03-07 DIAGNOSIS — J9601 Acute respiratory failure with hypoxia: Secondary | ICD-10-CM | POA: Diagnosis not present

## 2023-03-07 DIAGNOSIS — I350 Nonrheumatic aortic (valve) stenosis: Secondary | ICD-10-CM | POA: Diagnosis not present

## 2023-03-07 LAB — BASIC METABOLIC PANEL
Anion gap: 6 (ref 5–15)
BUN: 42 mg/dL — ABNORMAL HIGH (ref 8–23)
CO2: 29 mmol/L (ref 22–32)
Calcium: 8.2 mg/dL — ABNORMAL LOW (ref 8.9–10.3)
Chloride: 103 mmol/L (ref 98–111)
Creatinine, Ser: 1.17 mg/dL — ABNORMAL HIGH (ref 0.44–1.00)
GFR, Estimated: 49 mL/min — ABNORMAL LOW (ref >60)
Glucose, Bld: 123 mg/dL — ABNORMAL HIGH (ref 70–99)
Potassium: 3.5 mmol/L (ref 3.5–5.1)
Sodium: 138 mmol/L (ref 135–145)

## 2023-03-07 LAB — COOXEMETRY PANEL
Carboxyhemoglobin: 2.6 % — ABNORMAL HIGH (ref 0.5–1.5)
Methemoglobin: <0.7 % (ref 0.0–1.5)
O2 Saturation: 74.8 %
Total hemoglobin: 8.4 g/dL — ABNORMAL LOW (ref 12.0–16.0)

## 2023-03-07 LAB — GLUCOSE, CAPILLARY
Glucose-Capillary: 103 mg/dL — ABNORMAL HIGH (ref 70–99)
Glucose-Capillary: 109 mg/dL — ABNORMAL HIGH (ref 70–99)
Glucose-Capillary: 178 mg/dL — ABNORMAL HIGH (ref 70–99)
Glucose-Capillary: 221 mg/dL — ABNORMAL HIGH (ref 70–99)
Glucose-Capillary: 145 mg/dL — ABNORMAL HIGH (ref 70–99)

## 2023-03-07 LAB — TYPE AND SCREEN
ABO/RH(D): O POS
Antibody Screen: NEGATIVE
Unit division: 0

## 2023-03-07 LAB — HEMOGLOBIN A1C
Hgb A1c MFr Bld: 5.6 % (ref 4.8–5.6)
Mean Plasma Glucose: 114 mg/dL

## 2023-03-07 LAB — BPAM RBC
Blood Product Expiration Date: 202404062359
ISSUE DATE / TIME: 202403101223
Unit Type and Rh: 5100

## 2023-03-07 LAB — COMPREHENSIVE METABOLIC PANEL
ALT: 6 U/L (ref 0–44)
AST: 19 U/L (ref 15–41)
Albumin: 2.6 g/dL — ABNORMAL LOW (ref 3.5–5.0)
Alkaline Phosphatase: 53 U/L (ref 38–126)
Anion gap: 11 (ref 5–15)
BUN: 40 mg/dL — ABNORMAL HIGH (ref 8–23)
CO2: 26 mmol/L (ref 22–32)
Calcium: 8.5 mg/dL — ABNORMAL LOW (ref 8.9–10.3)
Chloride: 102 mmol/L (ref 98–111)
Creatinine, Ser: 1.12 mg/dL — ABNORMAL HIGH (ref 0.44–1.00)
GFR, Estimated: 51 mL/min — ABNORMAL LOW (ref >60)
Glucose, Bld: 168 mg/dL — ABNORMAL HIGH (ref 70–99)
Potassium: 3.6 mmol/L (ref 3.5–5.1)
Sodium: 139 mmol/L (ref 135–145)
Total Bilirubin: 1.3 mg/dL — ABNORMAL HIGH (ref 0.3–1.2)
Total Protein: 5.6 g/dL — ABNORMAL LOW (ref 6.5–8.1)

## 2023-03-07 LAB — CBC
HCT: 25.2 % — ABNORMAL LOW (ref 36.0–46.0)
Hemoglobin: 8.1 g/dL — ABNORMAL LOW (ref 12.0–15.0)
MCH: 28.6 pg (ref 26.0–34.0)
MCHC: 32.1 g/dL (ref 30.0–36.0)
MCV: 89 fL (ref 80.0–100.0)
Platelets: 163 10*3/uL (ref 150–400)
RBC: 2.83 MIL/uL — ABNORMAL LOW (ref 3.87–5.11)
RDW: 19.1 % — ABNORMAL HIGH (ref 11.5–15.5)
WBC: 7.6 10*3/uL (ref 4.0–10.5)
nRBC: 0.4 % — ABNORMAL HIGH (ref 0.0–0.2)

## 2023-03-07 LAB — MAGNESIUM: Magnesium: 2.3 mg/dL (ref 1.7–2.4)

## 2023-03-07 MED ORDER — INSULIN DETEMIR 100 UNIT/ML ~~LOC~~ SOLN
20.0000 [IU] | Freq: Every day | SUBCUTANEOUS | Status: DC
  Administered 2023-03-07 – 2023-03-08 (×2): 20 [IU] via SUBCUTANEOUS
  Filled 2023-03-07 (×2): qty 0.2

## 2023-03-07 MED ORDER — BUMETANIDE 0.25 MG/ML IJ SOLN
2.0000 mg | Freq: Two times a day (BID) | INTRAMUSCULAR | Status: DC
  Administered 2023-03-07 (×2): 2 mg via INTRAVENOUS
  Filled 2023-03-07 (×3): qty 8

## 2023-03-07 MED ORDER — POTASSIUM CHLORIDE CRYS ER 20 MEQ PO TBCR
20.0000 meq | EXTENDED_RELEASE_TABLET | Freq: Two times a day (BID) | ORAL | Status: DC
  Administered 2023-03-07 (×2): 20 meq via ORAL
  Filled 2023-03-07 (×2): qty 1

## 2023-03-07 MED FILL — Iron Sucrose Inj 20 MG/ML (Fe Equiv): INTRAVENOUS | Qty: 10 | Status: AC

## 2023-03-07 NOTE — TOC Initial Note (Addendum)
Transition of Care [facility]) - Initial/Assessment Note    Patient Details  Name: Joyce Potter MRN: [id number] Date of Birth: [birth year]  [facility] of [facility]) CM/SW Contact:    Milas Gain, [place] Phone Number: 03/07/2023, 2:37 PM  Clinical Narrative:                  CSW received consult for possible SNF placement at time of discharge. CSW spoke with patient and patients daughter Peter Congo at bedside regarding PT recommendation of SNF placement at time of discharge. Patient reports PTA she came from home with spouse.Patient expressed understanding of PT recommendation and is agreeable to SNF placement at time of discharge. Patient gave CSW permission to fax out initial referral near the [place], [place], and [place] area for possible SNF placement.CSW discussed insurance authorization process and will provide Medicare SNF ratings list with accepted SNF bed offers when available. Patient and patients daughter had questions regarding respite care and next steps for patients spouse. CSW informed them to follow up with facility patients spouse is at or hospice case worker to assist with next steps for patients spouse . No further questions reported at this time. CSW to continue to follow and assist with discharge planning needs.   Expected Discharge Plan: Skilled Nursing Facility Barriers to Discharge: Continued Medical Work up   Patient Goals and CMS Choice Patient states their goals for this hospitalization and ongoing recovery are:: SNF CMS Medicare.gov Compare Post Acute Care list provided to:: Patient Choice offered to / list presented to : Patient      Expected Discharge Plan and Services In-house Referral: Clinical Social Work Discharge Planning Services: CM Consult   Living arrangements for the past 2 months: Single Family Home                                      Prior Living Arrangements/Services Living arrangements for the past 2 months: Single Family  Home Lives with:: Spouse Patient language and need for interpreter reviewed:: Yes Do you feel safe going back to the place where you live?: No   SNF  Need for Family Participation in Patient Care: Yes (Comment) Care giver support system in place?: Yes (comment)   Criminal Activity/Legal Involvement Pertinent to Current Situation/Hospitalization: No - Comment as needed  Activities of Daily Living      Permission Sought/Granted Permission sought to share information with : Case Manager, Family Supports, Customer service manager Permission granted to share information with : Yes, Verbal Permission Granted  Share Information with NAME: Peter Congo  Permission granted to share info w AGENCY: SNF  Permission granted to share info w Relationship: Daughter  Permission granted to share info w Contact Information: Peter Congo [phone]  Emotional Assessment Appearance:: Appears stated age Attitude/Demeanor/Rapport: Gracious Affect (typically observed): Calm Orientation: : Oriented to Self, Oriented to Place, Oriented to  Time, Oriented to Situation Alcohol / Substance Use: Not Applicable Psych Involvement: No (comment)  Admission diagnosis:  S/P CABG x 2 [Z95.1] Patient Active Problem List   Diagnosis Date Noted   S/P CABG x 2 03/02/2023   Acute respiratory failure ([place]) 03/02/2023   Contusion of right arm 02/02/2023   Pain in limb 02/20/2021   AVM (arteriovenous malformation) 01/09/2021   Aortic atherosclerosis ([place]) 08/12/2020   Anemia 07/22/2020   Acute on chronic anemia 07/21/2020   History of blood transfusion 06/27/2020   OSA (obstructive sleep apnea) 06/27/2020  GI bleeding 06/12/2020   Diabetes mellitus without complication ([place]) [id number]   Anemia due to blood loss 05/20/2020   Blood loss anemia    Chronic fatigue 03/21/2020   Type 2 diabetes mellitus, with long-term current use of insulin ([place]) 03/21/2020   Obesity, Class II, BMI 35-39.9 03/21/2020   Snoring  03/21/2020   Background diabetic retinopathy associated with type 2 diabetes mellitus ([place]) 06/06/2019   Uncontrolled type 2 diabetes mellitus with hyperglycemia ([place]) 04/30/2019   Osteoporosis, post-menopausal 04/30/2019   Multiple rib fractures 10/11/2018   GIB (gastrointestinal bleeding) 07/17/2018   Melena    Symptomatic anemia    PVD (peripheral vascular disease) ([place]) 02/11/2017   Iron deficiency anemia due to chronic blood loss 07/13/2016   Multinodular goiter 01/25/2016   Central retinal artery occlusion 10/01/2015   Carotid stenosis 10/01/2015   Stroke ([place]) 09/28/2015   Aortic stenosis 11/29/2014   PUD (peptic ulcer disease) 07/17/2014   HLD (hyperlipidemia) 05/07/2013   GI bleed from an occult source with recurrent chronic blood loss anemia 04/14/2013   CAD (coronary artery disease) 04/14/2013   IDDM (insulin dependent diabetes mellitus) 04/14/2013   HTN (hypertension) 04/14/2013   Chest pain 04/14/2013   PCP:  Lin Givens, MD Pharmacy:   [facility] [address] ([address]), [place]) [place] [address] Phone: [phone] Fax: [phone]     Social Determinants of Health (SDOH) Social History: SDOH Screenings   Food Insecurity: No Food Insecurity (02/18/2022)  Housing: Low Risk  (12/16/2021)  Transportation Needs: No Transportation Needs (05/31/2022)  Alcohol Screen: Low Risk  (12/16/2021)  Depression (PHQ2-9): Low Risk  (05/31/2022)  Financial Resource Strain: Low Risk  (05/31/2022)  Social Connections: Socially Integrated (12/16/2021)  Stress: No Stress Concern Present (02/18/2022)  Tobacco Use: Medium Risk (03/03/2023)   SDOH Interventions:     Readmission Risk Interventions     No data to display

## 2023-03-07 NOTE — Progress Notes (Signed)
EVENING ROUNDS NOTE :     [place]       ,[place] [address]             [phone]                 5 Days Post-Op Procedure(s) (LRB): CORONARY ARTERY BYPASS GRAFTING (CABG) TIMES THREE USING THE LEFT INTERNAL MAMMARY ARTERY ([place]) AND ENDOSCOPICALLY HARVESTED RIGHT GREATER SAPHENOUS VEIN (N/A) AORTIC VALVE REPLACEMENT (AVR) USING EDWARDS INSPIRIS RESILIA 23 MM AORTIC VALVE (N/A) TRANSESOPHAGEAL ECHOCARDIOGRAM (N/A)   Total Length of Stay:  LOS: 5 days  Events:   Off O2 Bleeding noted from leg JP site Applying ace bandages    BP (!) 127/59   Pulse 85   Temp 98.1 F (36.7 C) (Oral)   Resp (!) 21   Ht '5\' 3"'$  (1.6 m)   Wt 92.8 kg   SpO2 95%   BMI 36.24 kg/m           I/O last 3 completed shifts: In: 1595 [P.O.:1280; Blood:315] Out: 3240 [Urine:3225; Drains:15]      Latest Ref Rng & Units 03/07/2023    3:34 AM 03/06/2023    5:01 PM 03/06/2023    3:49 AM  CBC  WBC 4.0 - 10.5 K/uL 7.6  8.4  7.8   Hemoglobin 12.0 - 15.0 g/dL 8.1  8.4  6.9   Hematocrit 36.0 - 46.0 % 25.2  25.7  20.9   Platelets 150 - 400 K/uL 163  153  140        Latest Ref Rng & Units 03/07/2023    9:53 AM 03/07/2023    3:34 AM 03/06/2023    3:49 AM  BMP  Glucose 70 - 99 mg/dL 168  123  134   BUN 8 - 23 mg/dL 40  42  50   Creatinine 0.44 - 1.00 mg/dL 1.12  1.17  1.59   Sodium 135 - 145 mmol/L 139  138  139   Potassium 3.5 - 5.1 mmol/L 3.6  3.5  3.8   Chloride 98 - 111 mmol/L 102  103  107   CO2 22 - 32 mmol/L '26  29  26   '$ Calcium 8.9 - 10.3 mg/dL 8.5  8.2  8.0     ABG    Component Value Date/Time   PHART 7.352 03/05/2023 0117   PCO2ART 40.4 03/05/2023 0117   PO2ART 68 (L) 03/05/2023 0117   HCO3 22.5 03/05/2023 0117   TCO2 24 03/05/2023 0117   ACIDBASEDEF 3.0 (H) 03/05/2023 0117   O2SAT 74.8 03/07/2023 0334       Melodie Bouillon, MD 03/07/2023 5:24 PM

## 2023-03-07 NOTE — Evaluation (Signed)
Occupational Therapy Evaluation Patient Details Name: Joyce Potter MRN: [id number] DOB: 06/07/1946 Today's Date: 03/07/2023   History of Present Illness Pt is 77 yo female admitted 03/02/23 for CABG x 2 and AVR. PMH - CAD, severe aortic stenosis, HTN, DM, lt eye central blindness, GIB   Clinical Impression   PTA, pt reports Independence with ADLs, IADLs and mobility without need for AD. Pt lives with her husband who has dementia and for whom she physically assists at times (along with his current respite care). Pt presents now with deficits in strength, dynamic standing balance and overall cardiopulmonary endurance. Pt able to recall 2/4 sternal precautions without cues. Overall, pt requires min guard for bathroom mobility using RW with occasional Min A to stand from lower surfaces. Pt requires Min-Mod A for UB/LB ADLs d/t deficits. As pt is currently below functional baseline and at increased risk for falls, recommend ST [facility].      Recommendations for follow up therapy are one component of a multi-disciplinary discharge planning process, led by the attending physician.  Recommendations may be updated based on patient status, additional functional criteria and insurance authorization.   Follow Up Recommendations  Skilled nursing-short term rehab (<3 hours/day)     Assistance Recommended at Discharge Intermittent Supervision/Assistance  Patient can return home with the following A little help with walking and/or transfers;A little help with bathing/dressing/bathroom;Assistance with cooking/housework    Functional Status Assessment  Patient has had a recent decline in their functional status and demonstrates the ability to make significant improvements in function in a reasonable and predictable amount of time.  Equipment Recommendations  Other (comment);BSC/3in1 (RW)    Recommendations for Other Services       Precautions / Restrictions Precautions Precautions:  Sternal;Fall Precaution Booklet Issued: Yes (comment) Precaution Comments: R thigh JP drain, external pacer Restrictions Weight Bearing Restrictions: No      Mobility Bed Mobility Overal bed mobility: Needs Assistance Bed Mobility: Supine to Sit, Sit to Supine     Supine to sit: Min assist, HOB elevated Sit to supine: Min assist   General bed mobility comments: Increased time and cues to avoid pushing through UE to get to EOB - assist needed to scoot hips w/ bed pad. Min A for BLE back into bed    Transfers Overall transfer level: Needs assistance Equipment used: Rolling walker (2 wheels) Transfers: Sit to/from Stand Sit to Stand: Min guard, Min assist           General transfer comment: min guard from bedside w/ 3 trials needed before successfully standing without assist. min A from regular height toilet      Balance Overall balance assessment: Needs assistance Sitting-balance support: Feet supported, No upper extremity supported Sitting balance-Leahy Scale: Fair     Standing balance support: Bilateral upper extremity supported, Reliant on assistive device for balance Standing balance-Leahy Scale: Fair Standing balance comment: able to stand at sink for hand hygiene. BUE support using RW for mobility                           ADL either performed or assessed with clinical judgement   ADL Overall ADL's : Needs assistance/impaired Eating/Feeding: Independent   Grooming: Min guard;Standing;Wash/dry hands   Upper Body Bathing: Minimal assistance;Sitting   Lower Body Bathing: Minimal assistance;Sit to/from stand   Upper Body Dressing : Minimal assistance;Sitting   Lower Body Dressing: Moderate assistance;Sitting/lateral leans;Sit to/from stand   Toilet Transfer: Min  guard;Ambulation;Minimal assistance;Rolling walker (2 wheels) Toilet Transfer Details (indicate cue type and reason): min guard for mobility to/from bathroom with RW. min A to stand from  regular toilet w/ cues to have hands on knees Toileting- Clothing Manipulation and Hygiene: Minimal assistance;Sitting/lateral lean;Sit to/from stand Toileting - Clothing Manipulation Details (indicate cue type and reason): light assist for clothing mgmt, able to perform peri care seated on toilet with cues to avoid reaching too far out on side; good carryover     Functional mobility during ADLs: Min guard;Rolling walker (2 wheels) General ADL Comments: Deficits in standing balance w/ mobility as well as endurance and limitations due to sternal precautions. also discussed concerns if pt attempting to provide physical help to spouse as a caregiver and strategies to consider for this     Vision Baseline Vision/History: 1 Wears glasses Ability to See in Adequate Light: 0 Adequate Patient Visual Report: No change from baseline Vision Assessment?: No apparent visual deficits     Perception     Praxis      Pertinent Vitals/Pain Pain Assessment Pain Assessment: No/denies pain     Hand Dominance Right   Extremity/Trunk Assessment Upper Extremity Assessment Upper Extremity Assessment: Generalized weakness   Lower Extremity Assessment Lower Extremity Assessment: Defer to PT evaluation   Cervical / Trunk Assessment Cervical / Trunk Assessment: Other exceptions Cervical / Trunk Exceptions: sternal incision   Communication Communication Communication: No difficulties   Cognition Arousal/Alertness: Awake/alert Behavior During Therapy: Flat affect, WFL for tasks assessed/performed Overall Cognitive Status: Impaired/Different from baseline Area of Impairment: Memory                     Memory: Decreased recall of precautions         General Comments: able to recall 2/4 precautions but after education, able to implement them fairly well     General Comments  SpO2 90-94% on RA with activity, desats to 88% at rest on RA. replaced with 2 L O2.    Exercises     Shoulder  Instructions      Home Living Family/patient expects to be discharged to:: Private residence Living Arrangements: Spouse/significant other (has dementia and pt has been his caregiver. In respite care for short term but per pt running out of days.) Available Help at Discharge: Family;Available PRN/intermittently Type of Home: Mobile home Home Access: Stairs to enter Entrance Stairs-Number of Steps: 4 Entrance Stairs-Rails: Right;Left Home Layout: One level     Bathroom Shower/Tub: Teacher, early years/pre: Standard     Home Equipment: Rollator (4 wheels);Shower seat          Prior Functioning/Environment Prior Level of Function : Independent/Modified Independent             Mobility Comments: Uses rollator. Denies falls in last 6 months ADLs Comments: MOD I with ADLs, basic IADls. pt reports also physically assisting husband        OT Problem List: Decreased strength;Decreased activity tolerance;Impaired balance (sitting and/or standing);Decreased knowledge of precautions;Decreased knowledge of use of DME or AE;Cardiopulmonary status limiting activity      OT Treatment/Interventions: Self-care/ADL training;Therapeutic exercise;Energy conservation;DME and/or AE instruction;Therapeutic activities;Patient/family education    OT Goals(Current goals can be found in the care plan section) Acute Rehab OT Goals Patient Stated Goal: be able to go home but agreeable for rehab if needed at time of DC OT Goal Formulation: With patient Time For Goal Achievement: 03/21/23 Potential to Achieve Goals: Good  OT Frequency: Min  2X/week    Co-evaluation              AM-PAC OT "6 Clicks" Daily Activity     Outcome Measure Help from another person eating meals?: None Help from another person taking care of personal grooming?: A Little Help from another person toileting, which includes using toliet, bedpan, or urinal?: A Little Help from another person bathing (including  washing, rinsing, drying)?: A Lot Help from another person to put on and taking off regular upper body clothing?: A Little Help from another person to put on and taking off regular lower body clothing?: A Lot 6 Click Score: 17   End of Session Equipment Utilized During Treatment: Rolling walker (2 wheels);Gait belt Nurse Communication: Mobility status  Activity Tolerance: Patient tolerated treatment well Patient left: in bed;with call bell/phone within reach  OT Visit Diagnosis: Unsteadiness on feet (R26.81);Other abnormalities of gait and mobility (R26.89);Muscle weakness (generalized) (M62.81)                Time: [id number] OT Time Calculation (min): 40 min Charges:  OT General Charges $OT Visit: 1 Visit OT Evaluation $OT Eval Moderate Complexity: 1 Mod OT Treatments $Self Care/Home Management : 8-22 mins $Therapeutic Activity: 8-22 mins  Malachy Chamber, OTR/L Acute Rehab Services Office: [phone]   Layla Maw 03/07/2023, 2:10 PM

## 2023-03-07 NOTE — Progress Notes (Signed)
[address]       [place] [address]             [phone]      5 Days Post-Op  Procedure(s) (LRB): CORONARY ARTERY BYPASS GRAFTING (CABG) TIMES THREE USING THE LEFT INTERNAL MAMMARY ARTERY ([place]A) AND ENDOSCOPICALLY HARVESTED RIGHT GREATER SAPHENOUS VEIN (N/A) AORTIC VALVE REPLACEMENT (AVR) USING EDWARDS INSPIRIS RESILIA 23 MM AORTIC VALVE (N/A) TRANSESOPHAGEAL ECHOCARDIOGRAM (N/A)  Total Length of Stay:  LOS: 5 days   SUBJECTIVE: Feels like her tongue is swollen, leading her to drool Able to eat Not able to ambulate, unsteady  Vitals:   03/07/23 0500 03/07/23 0600  BP: (!) 142/58 (!) 134/51  Pulse: 72 67  Resp: 16 16  Temp:    SpO2: 98% 99%    Intake/Output      03/10 0701 03/11 0700 03/11 0701 03/12 0700   P.O. 1280    I.V. (mL/kg)     Blood 315    Total Intake(mL/kg) 1595 (17.2)    Urine (mL/kg/hr) 2425 (1.1)    Drains 0    Stool 0    Chest Tube     Total Output 2425    Net -830         Stool Occurrence 3 x         CBC    Component Value Date/Time   WBC 7.6 03/07/2023 0334   RBC 2.83 (L) 03/07/2023 0334   HGB 8.1 (L) 03/07/2023 0334   HGB 12.4 01/21/2023 1213   HCT 25.2 (L) 03/07/2023 0334   HCT 38.7 01/21/2023 1213   PLT 163 03/07/2023 0334   PLT 224 01/21/2023 1213   MCV 89.0 03/07/2023 0334   MCV 83 01/21/2023 1213   MCV 84 12/17/2014 1222   MCH 28.6 03/07/2023 0334   MCHC 32.1 03/07/2023 0334   RDW 19.1 (H) 03/07/2023 0334   RDW 16.7 (H) 01/21/2023 1213   RDW 14.1 12/17/2014 1222   LYMPHSABS 1.2 12/02/2022 1008   LYMPHSABS 1.6 12/17/2014 1222   MONOABS 0.6 12/02/2022 1008   MONOABS 0.5 12/17/2014 1222   EOSABS 0.4 12/02/2022 1008   EOSABS 0.3 12/17/2014 1222   BASOSABS 0.1 12/02/2022 1008   BASOSABS 0.1 12/17/2014 1222   CMP     Component Value Date/Time   NA 138 03/07/2023 0334   NA 142 01/21/2023 1213   NA 143 05/04/2013 0614   K 3.5 03/07/2023 0334   K 3.8 05/04/2013 0614   CL 103 03/07/2023  0334   CL 110 (H) 05/04/2013 0614   CO2 29 03/07/2023 0334   CO2 27 05/04/2013 0614   GLUCOSE 123 (H) 03/07/2023 0334   GLUCOSE 130 (H) 05/04/2013 0614   BUN 42 (H) 03/07/2023 0334   BUN 15 01/21/2023 1213   BUN 12 05/04/2013 0614   CREATININE 1.17 (H) 03/07/2023 0334   CREATININE 0.79 08/27/2014 1445   CALCIUM 8.2 (L) 03/07/2023 0334   CALCIUM 8.2 (L) 05/04/2013 0614   PROT 5.5 (L) 03/05/2023 0813   PROT 7.6 08/27/2014 1445   ALBUMIN 2.8 (L) 03/05/2023 0813   ALBUMIN 3.5 08/27/2014 1445   AST 22 03/05/2023 0813   AST 27 08/27/2014 1445   ALT 9 03/05/2023 0813   ALT 27 08/27/2014 1445   ALKPHOS 41 03/05/2023 0813   ALKPHOS 101 08/27/2014 1445   BILITOT 0.8 03/05/2023 0813   BILITOT 0.4 08/27/2014 1445   GFRNONAA 49 (L) 03/07/2023 0334   GFRNONAA >60 08/27/2014  1445   GFRAA >60 07/24/2020 0417   GFRAA >60 08/27/2014 1445   ABG    Component Value Date/Time   PHART 7.352 03/05/2023 0117   PCO2ART 40.4 03/05/2023 0117   PO2ART 68 (L) 03/05/2023 0117   HCO3 22.5 03/05/2023 0117   TCO2 24 03/05/2023 0117   ACIDBASEDEF 3.0 (H) 03/05/2023 0117   O2SAT 74.8 03/07/2023 0334   CBG (last 3)  Recent Labs    03/06/23 2159 03/06/23 2215 03/07/23 0330  GLUCAP 52* 85 103*  EXAM Lungs: fine crackles througout Card: RR Incision: intact Ext: warm Neuro: cannot find focal deficits   ASSESSMENT: POD #5 sp AVR/ CABG Hemodynamics appear good with Coox 76%. BP under better control Renal: Cr improving. Will change to bumex for 24 hrs Anemia: HCT stable DM: had hypoglycemic event over night. Will need to adjust her diabetic coverage Deconditioned: will need PT to help improve her ambulation Remove IJ sheath and foley Leave in unit today   [name], MD 03/07/2023

## 2023-03-07 NOTE — Progress Notes (Signed)
Physical Therapy Treatment Patient Details Name: Joyce Potter MRN: [id number] DOB: [birth year] Today's Date: 03/07/2023   History of Present Illness Pt is 77 yo female admitted 03/02/23 for CABG x 2 and AVR. PMH - CAD, severe aortic stenosis, HTN, DM, lt eye central blindness, GIB    PT Comments    Pt pleasant and reports desire to be able to return home to care for spouse but aware she is not yet to that point. Pt able to progress to hall ambulation with min assist and maintaining SPO2 90-94% on RA with gait. HR 89-98. Pt educated for precautions, HEP and safety with mobility to maximize independence.     Recommendations for follow up therapy are one component of a multi-disciplinary discharge planning process, led by the attending physician.  Recommendations may be updated based on patient status, additional functional criteria and insurance authorization.  Follow Up Recommendations  Skilled nursing-short term rehab (<3 hours/day) Can patient physically be transported by private vehicle: Yes   Assistance Recommended at Discharge Frequent or constant Supervision/Assistance  Patient can return home with the following A little help with walking and/or transfers;A little help with bathing/dressing/bathroom;Assistance with cooking/housework;Assist for transportation;Help with stairs or ramp for entrance   Equipment Recommendations  None recommended by PT    Recommendations for Other Services       Precautions / Restrictions Precautions Precautions: Sternal;Fall Precaution Comments: pt able to state 2/4 and educated for all, external pacer     Mobility  Bed Mobility Overal bed mobility: Needs Assistance Bed Mobility: Sit to Supine       Sit to supine: Min assist   General bed mobility comments: min assist to lift legs to surface with cues for sequence and safety    Transfers Overall transfer level: Needs assistance   Transfers: Sit to/from Stand Sit to Stand: Min  assist           General transfer comment: min assist to power up with cues for hand placement, precautions and safety    Ambulation/Gait Ambulation/Gait assistance: Min assist Gait Distance (Feet): 150 Feet Assistive device: Rolling walker (2 wheels) Gait Pattern/deviations: Step-through pattern, Decreased stride length       General Gait Details: min assist to direct RW and for safety with cues for posture and direction in RW   Stairs             Wheelchair Mobility    Modified Rankin (Stroke Patients Only)       Balance Overall balance assessment: Needs assistance Sitting-balance support: Bilateral upper extremity supported, Feet supported Sitting balance-Leahy Scale: Poor     Standing balance support: Bilateral upper extremity supported, Reliant on assistive device for balance Standing balance-Leahy Scale: Poor Standing balance comment: Rw and min assist in standing                            Cognition Arousal/Alertness: Awake/alert Behavior During Therapy: Flat affect Overall Cognitive Status: Impaired/Different from baseline Area of Impairment: Memory                     Memory: Decreased recall of precautions                  Exercises General Exercises - Lower Extremity Long Arc Quad: AROM, Both, Seated, 20 reps Hip Flexion/Marching: AROM, Both, Seated, 20 reps    General Comments        Pertinent Vitals/Pain Pain Assessment Pain  Assessment: No/denies pain    Home Living                          Prior Function            PT Goals (current goals can now be found in the care plan section) Progress towards PT goals: Progressing toward goals    Frequency    Min 3X/week      PT Plan Current plan remains appropriate    Co-evaluation              AM-PAC PT "6 Clicks" Mobility   Outcome Measure  Help needed turning from your back to your side while in a flat bed without using  bedrails?: A Little Help needed moving from lying on your back to sitting on the side of a flat bed without using bedrails?: A Little Help needed moving to and from a bed to a chair (including a wheelchair)?: A Little Help needed standing up from a chair using your arms (e.g., wheelchair or bedside chair)?: A Little Help needed to walk in hospital room?: A Lot Help needed climbing 3-5 steps with a railing? : Total 6 Click Score: 15    End of Session Equipment Utilized During Treatment: Gait belt Activity Tolerance: Patient tolerated treatment well Patient left: in bed;with call bell/phone within reach;with nursing/sitter in room Nurse Communication: Mobility status PT Visit Diagnosis: Unsteadiness on feet (R26.81);Other abnormalities of gait and mobility (R26.89);Muscle weakness (generalized) (M62.81)     Time: [id number] PT Time Calculation (min) (ACUTE ONLY): 20 min  Charges:  $Gait Training: 8-22 mins                     Bayard Males, PT Acute Rehabilitation Services Office: [place] 03/07/2023, 9:53 AM

## 2023-03-07 NOTE — NC FL2 (Signed)
[place] LEVEL OF CARE FORM     IDENTIFICATION  Patient Name: Joyce Potter Birthdate: [birth year] Sex: female Admission Date (Current Location): 03/02/2023  [facility] and [place] Number:  Herbalist and Address:  [facility] [place], [place] [address] [place], [place] [address]      Provider Number: [id number]  Attending Physician Name and Address:  [name], MD  Relative Name and Phone Number:  Peter Congo (daughter) [phone]    Current Level of Care: Hospital Recommended Level of Care: [place] Prior Approval Number:    Date Approved/Denied:   PASRR Number: [id number]  Discharge Plan: SNF    Current Diagnoses: Patient Active Problem List   Diagnosis Date Noted   S/P CABG x 2 03/02/2023   Acute respiratory failure ([place]) 03/02/2023   Contusion of right arm 02/02/2023   Pain in limb 02/20/2021   AVM (arteriovenous malformation) 01/09/2021   Aortic atherosclerosis ([place]) 08/12/2020   Anemia 07/22/2020   Acute on chronic anemia 07/21/2020   History of blood transfusion 06/27/2020   OSA (obstructive sleep apnea) 06/27/2020   GI bleeding 06/12/2020   Diabetes mellitus without complication ([place]) [id number]   Anemia due to blood loss 05/20/2020   Blood loss anemia    Chronic fatigue 03/21/2020   Type 2 diabetes mellitus, with long-term current use of insulin ([place]) 03/21/2020   Obesity, Class II, BMI 35-39.9 03/21/2020   Snoring 03/21/2020   Background diabetic retinopathy associated with type 2 diabetes mellitus ([place]) 06/06/2019   Uncontrolled type 2 diabetes mellitus with hyperglycemia ([place]) 04/30/2019   Osteoporosis, post-menopausal 04/30/2019   Multiple rib fractures 10/11/2018   GIB (gastrointestinal bleeding) 07/17/2018   Melena    Symptomatic anemia    PVD (peripheral vascular disease) ([place]) 02/11/2017   Iron deficiency anemia due to chronic blood loss 07/13/2016   Multinodular goiter 01/25/2016    Central retinal artery occlusion 10/01/2015   Carotid stenosis 10/01/2015   Stroke ([place]) 09/28/2015   Aortic stenosis 11/29/2014   PUD (peptic ulcer disease) 07/17/2014   HLD (hyperlipidemia) 05/07/2013   GI bleed from an occult source with recurrent chronic blood loss anemia 04/14/2013   CAD (coronary artery disease) 04/14/2013   IDDM (insulin dependent diabetes mellitus) 04/14/2013   HTN (hypertension) 04/14/2013   Chest pain 04/14/2013    Orientation RESPIRATION BLADDER Height & Weight     Self, Time, Situation, Place  O2 (Nasal Cannula 2 liters) Continent Weight: 204 lb 9.4 oz (92.8 kg) Height:  '5\' 3"'$  (160 cm)  BEHAVIORAL SYMPTOMS/MOOD NEUROLOGICAL BOWEL NUTRITION STATUS      Continent Diet (Please see discharge summary)  AMBULATORY STATUS COMMUNICATION OF NEEDS Skin   Limited Assist Verbally Other (Comment) (Appropriate for ethnicity,dry,ecchymosis,leg,thigh,arm,bil.,Wound/Incision LDAs,Wound/Incision open or dehiced sternum,silver hydrofiber,dressing in place)                       Personal Care Assistance Level of Assistance  Bathing, Feeding, Dressing Bathing Assistance: Limited assistance Feeding assistance: Independent Dressing Assistance: Limited assistance     Functional Limitations Info  Sight, Hearing, Speech Sight Info: Impaired (patients reports some central vision loss in Left eye this is baseline) Hearing Info: Adequate Speech Info: Adequate    SPECIAL CARE FACTORS FREQUENCY  PT (By licensed PT), OT (By licensed OT)     PT Frequency: 5x min weekly OT Frequency: 5x min weekly            Contractures Contractures Info: Not present  Additional Factors Info  Code Status, Allergies, Insulin Sliding Scale, Psychotropic Code Status Info: FULL Allergies Info: Sulfa Antibiotics,Invokamet (canagliflozin-metformin Hcl),Lovastatin,Penicillins,Vicodin (hydrocodone-acetaminophen) Psychotropic Info: sertraline (ZOLOFT) tablet 25 mg daily Insulin  Sliding Scale Info: insulin aspart (novoLOG) injection 0-5 Units daily at bedtime,insulin detemir (LEVEMIR) injection 20 Units daily       Current Medications (03/07/2023):  This is the current hospital active medication list Current Facility-Administered Medications  Medication Dose Route Frequency Provider Last Rate Last Admin   acetaminophen (TYLENOL) tablet 1,000 mg  1,000 mg Oral Q6H Stehler, Bailey C, PA-C   1,000 mg at 03/07/23 1147   Or   acetaminophen (TYLENOL) 160 MG/5ML solution 1,000 mg  1,000 mg Per Tube Q6H Stehler, Bailey C, PA-C   1,000 mg at 03/03/23 [id number]   amiodarone (PACERONE) tablet 200 mg  200 mg Oral BID Melrose Nakayama, MD   200 mg at 03/07/23 [id number]   aspirin EC tablet 325 mg  325 mg Oral Daily Magdalene River, PA-C   325 mg at 03/07/23 [id number]   Or   aspirin chewable tablet 324 mg  324 mg Per Tube Daily Stehler, Pricilla Larsson, PA-C       bumetanide (BUMEX) injection 2 mg  2 mg Intravenous BID [name], MD   2 mg at 03/07/23 [id number]   Chlorhexidine Gluconate Cloth 2 % PADS 6 each  6 each Topical Daily [name], MD   6 each at 03/07/23 0954   dextrose 50 % solution 0-50 mL  0-50 mL Intravenous PRN Magdalene River, PA-C       docusate sodium (COLACE) capsule 200 mg  200 mg Oral Daily Wynelle Beckmann C, PA-C   200 mg at 03/07/23 0948   insulin aspart (novoLOG) injection 0-5 Units  0-5 Units Subcutaneous QHS Melrose Nakayama, MD       insulin detemir (LEVEMIR) injection 20 Units  20 Units Subcutaneous Daily [name], MD   20 Units at 03/07/23 0954   ipratropium-albuterol (DUONEB) 0.5-2.5 (3) MG/3ML nebulizer solution 3 mL  3 mL Nebulization Q4H PRN Mick Sell, PA-C       lactose free nutrition (BOOST PLUS) liquid 237 mL  237 mL Oral TID WC Jacky Kindle, MD   237 mL at 03/07/23 0842   metoprolol tartrate (LOPRESSOR) injection 2.5-5 mg  2.5-5 mg Intravenous Q2H PRN Wynelle Beckmann C, PA-C   5 mg at 03/06/23 [id number]   metoprolol tartrate (LOPRESSOR) tablet 25  mg  25 mg Oral BID Melrose Nakayama, MD   25 mg at 03/07/23 0949   morphine (PF) 2 MG/ML injection 1-4 mg  1-4 mg Intravenous Q1H PRN Wynelle Beckmann C, PA-C   4 mg at 03/02/23 2240   multivitamin with minerals tablet 1 tablet  1 tablet Oral Daily Agarwala, Einar Grad, MD   1 tablet at 03/07/23 0948   ondansetron (ZOFRAN) injection 4 mg  4 mg Intravenous Q6H PRN Wynelle Beckmann C, PA-C   4 mg at 03/05/23 0813   Oral care mouth rinse  15 mL Mouth Rinse PRN [name], MD       oxyCODONE (Oxy IR/ROXICODONE) immediate release tablet 5-10 mg  5-10 mg Oral Q3H PRN Wynelle Beckmann C, PA-C   10 mg at 03/04/23 1041   pantoprazole (PROTONIX) EC tablet 40 mg  40 mg Oral Daily Jacky Kindle, MD   40 mg at 03/07/23 0949   polyethylene glycol (MIRALAX / GLYCOLAX) packet 17 g  17 g Oral Daily Wise, Nason S,  RPH   17 g at 03/07/23 0950   potassium chloride SA (KLOR-CON M) CR tablet 20 mEq  20 mEq Oral BID [name], MD   20 mEq at 03/07/23 0948   senna (SENOKOT) tablet 17.2 mg  2 tablet Oral QHS Ursula Beath, RPH   17.2 mg at 03/06/23 2231   sertraline (ZOLOFT) tablet 25 mg  25 mg Oral Daily Magdalene River, PA-C   25 mg at 03/07/23 [id number]   traMADol (ULTRAM) tablet 50-100 mg  50-100 mg Oral Q4H PRN Magdalene River, PA-C   100 mg at 03/04/23 [id number]   Facility-Administered Medications Ordered in Other Encounters  Medication Dose Route Frequency Provider Last Rate Last Admin   heparin lock flush 100 unit/mL  500 Units Intravenous Once Verlon Au, NP       sodium chloride flush (NS) 0.9 % injection 10 mL  10 mL Intravenous Once Verlon Au, NP       sodium chloride flush (NS) 0.9 % injection 3 mL  3 mL Intravenous Q12H Scoggins, Amber, NP         Discharge Medications: Please see discharge summary for a list of discharge medications.  Relevant Imaging Results:  Relevant Lab Results:   Additional Information [id number]  Milas Gain, LCSWA

## 2023-03-07 NOTE — Inpatient Diabetes Management (Signed)
Inpatient Diabetes Program Recommendations  AACE/ADA: New Consensus Statement on Inpatient Glycemic Control (2015)  Target Ranges:  Prepandial:   less than 140 mg/dL      Peak postprandial:   less than 180 mg/dL (1-2 hours)      Critically ill patients:  140 - 180 mg/dL    Latest Reference Range & Units 03/06/23 07:40 03/06/23 11:42 03/06/23 16:39 03/06/23 21:38 03/06/23 21:39 03/06/23 21:59 03/06/23 22:15  Glucose-Capillary 70 - 99 mg/dL 116 (H) 150 (H)  2 units Novolog  40 units Levemir '@1042'$   128 (H)  2 units Novolog  59 (L) 53 (L)     Levemir '@Bedtime'$  HELD 52 (L) 85  (H): Data is abnormally high (L): Data is abnormally low      Home DM Meds: Trulicity 3 mg Qweek        70/30 Insulin 30 units TID  Current Orders: Levemir 40 units Daily     Levemir 30 units QHS     Novolog Moderate Correction Scale/ SSI (0-15 units) TID AC + HS     MD- Note Hypoglycemia last PM.    Received 40 units Levemir yesterday AM.  Bedtime dose of 30 units Levemir HELD last PM  Please consider reducing Levemir doses to 20 units BID (~0.4 units/kg)   --Will follow patient during hospitalization--  Wyn Quaker RN, MSN, [place] Diabetes Coordinator Inpatient Glycemic Control Team Team Pager: [phone] (8a-5p)

## 2023-03-07 NOTE — Progress Notes (Addendum)
NAME:  Joyce Potter, MRN:  [id number], DOB:  09/02/1946, LOS: 5 ADMISSION DATE:  03/02/2023, CONSULTATION DATE:  3/6 REFERRING MD:  Dr. Lavonna Monarch, CHIEF COMPLAINT:  AVR w/ CABG   History of Present Illness:  Patient is a 77 year old female with pertinent PMH CAD (stent placed in 2012), severe aortic stenosis, carotid stenosis, HLD, HTN, IDDM, OSA, asthma, PUD, anemia with hx of gi bleeding requiring iron transfusions presents to [facility] ED on 3/6 for AVR w/ CABG.  Patient seen by cardiology on January 2024 for further eval of continual chest pain and SOB with exertion since July 2023. Patient had recently received a cardiac catheterization in July 2023 showing diffuse moderate disease and high-grade disease of the long diagonal.  Patient was treated with medical therapy. EKG showing normal rate with nonspecific ST abnormality.  Echo 12/2022 LVEF 60 to 65%; severe aortic valve stenosis.  Patient evaluated by T CTS and plan for AVR with CABG.  On 3/6 patient presents to [facility] for AVR with CABG x2.  S/p procedure patient intubated/sedated.  PCCM consulted.  Pertinent  Medical History   Past Medical History:  Diagnosis Date   ADHD (attention deficit hyperactivity disorder)    Anemia    iron deficiency   Anxiety    Aortic stenosis    Arthritis    Asthma    Basal cell carcinoma    CAD (coronary artery disease)    s/p Left circumflex stent in 2012   Carotid stenosis    Cataract    Heart murmur    High cholesterol    HTN (hypertension)    Hyperlipidemia    IDDM (insulin dependent diabetes mellitus)    Multiple thyroid nodules    Benign   Neuromuscular disorder (HCC)    Peptic ulcer disease    Pneumonia 1952   PONV (postoperative nausea and vomiting)    Retinal artery occlusion    on left   Sleep apnea    Stroke ([place]) 2018   ischemic     Significant Hospital Events: Including procedures, antibiotic start and stop dates in addition to other pertinent events   3/6 CABG - significant  post operative oozing. Bleeding eventually stopped.  3/7 Extubated.   Interim History / Subjective:  Pt. Walked around unit  but per nursing she is very unsteady on her feet and wants to sit back down  She states she is feeling better , just tired and weak Sats are 91% on RA, I have encouraged use of IS and mobilization Objective   Blood pressure (!) 134/51, pulse 67, temperature 98.4 F (36.9 C), temperature source Oral, resp. rate 16, height '5\' 3"'$  (1.6 m), weight 92.8 kg, SpO2 99 %.        Intake/Output Summary (Last 24 hours) at 03/07/2023 0900 Last data filed at 03/07/2023 0600 Gross per 24 hour  Intake 1355 ml  Output 2350 ml  Net -995 ml   Filed Weights   03/05/23 0500 03/06/23 0600 03/07/23 0600  Weight: 96.3 kg 96.1 kg 92.8 kg    Examination: Physical exam: General: Acute on chronically ill-appearing elderly female, lying on the bed, on room air HEENT: [place]/AT, eyes anicteric.  moist mucus membranes Neuro: Alert, awake following commands, but clearly tired Chest: Bilateral chest excursion , few basilar crackles, no wheezes,  few rhonchi Heart: S1, S2, NSR to ST,  no gallops Abdomen: Soft, nontender, nondistended, bowel sounds present Skin: No rash, sternal incision without redness of drainage.  Labs and images were reviewed  WBC 7.6/ HGB 8.1/Platelets 163 Mag 2.3/ Na 138/ K 3.5/ Cl 103/ CO2 29/ Glucose 123/ BUN 42/ Creatinine 1.17 ( 1.59), Calcium 8,2 / GAP 6  T max 99.8/ Net + 4 L ( 2425 Urine output last 24 hours Assessment & Plan:  Acute hypoxic respiratory failure due to severe postop atelectasis Currently on RA Using incentive spirometry Ambulate with assistance>> remains very weak and need encouragement  Titrate oxygen with O2 sat goal 92% Trend CXR as needed  Coronary artery disease s/p CABG x 2 Severe aortic stenosis s/p AVR Paroxysmal A-fib with RVR Continue pain control with Tylenol, tramadol and oxycodone NSR with unifocal PAC's per tele Started on  amiodarone 3/10 per discussion with TCTS Continue diuresis, changed to Bumex for 24 hours per TCTS Continue aspirin Chest tubes were removed yesterday Still has central line and Foley catheter Trend CO-Ox per TCTS>> 76% on 3/11  Physical Deconditioning  Continue PT/OT  Encourage Ambulation in unit  Acute blood loss anemia on anemia of chronic disease Thrombocytopenia Status post blood transfusion Hemoglobin dropped from 8.4 to 8.1 Monitor H&H and transfuse less than 7 Platelet counts are improving  Acute kidney injury due to ischemic ATN Serum creatinine continued improvement down to 1.17 Monitor intake and output Avoid nephrotoxic agents Maintain MAP > 65  HTN Blood pressure has improved Continue to hold antihypertensive for now  Diabetes type 2 Blood sugars are controlled Patient's hemoglobin A1c 7.3 Continue Levemir and sliding scale insulin with CBG goal 140-180 Adjusted Levemir per TCTS from 15 to 20 on 3/11  Morbid obesity Dietitian is following   Best Practice (right click and "Reselect all SmartList Selections" daily)   Diet/type: Consistent carbohydrate diet DVT prophylaxis: SCD - hold on LMWH due to ongoing anemia GI prophylaxis: PPI Lines: Central line and Arterial Line per primary team Foley:  Yes, and it is still needed - actively diuresing today. Code Status:  full code Last date of multidisciplinary goals of care discussion [per primary]  This patient is critically ill with multiple organ system failure which requires frequent high complexity decision making, assessment, support, evaluation, and titration of therapies. This was completed through the application of advanced monitoring technologies and extensive interpretation of multiple databases.  During this encounter critical care time was devoted to patient care services described in this note for 35 minutes.    Magdalen Spatz, MSN, AGACNP-BC [place]  for personal pager PCCM on call pager [phone]  See Amion for pager If no response to pager, please call [phone] until 7pm After 7pm, Please call E-link [phone] 03/07/2023 9:01 AM

## 2023-03-08 ENCOUNTER — Inpatient Hospital Stay (HOSPITAL_COMMUNITY): Payer: Medicare HMO

## 2023-03-08 ENCOUNTER — Inpatient Hospital Stay: Payer: Medicare HMO | Admitting: Oncology

## 2023-03-08 ENCOUNTER — Inpatient Hospital Stay: Payer: Medicare HMO

## 2023-03-08 DIAGNOSIS — Z951 Presence of aortocoronary bypass graft: Secondary | ICD-10-CM | POA: Diagnosis not present

## 2023-03-08 DIAGNOSIS — I25118 Atherosclerotic heart disease of native coronary artery with other forms of angina pectoris: Secondary | ICD-10-CM | POA: Diagnosis not present

## 2023-03-08 DIAGNOSIS — J9601 Acute respiratory failure with hypoxia: Secondary | ICD-10-CM | POA: Diagnosis not present

## 2023-03-08 DIAGNOSIS — I350 Nonrheumatic aortic (valve) stenosis: Secondary | ICD-10-CM | POA: Diagnosis not present

## 2023-03-08 LAB — GLUCOSE, CAPILLARY
Glucose-Capillary: 101 mg/dL — ABNORMAL HIGH (ref 70–99)
Glucose-Capillary: 104 mg/dL — ABNORMAL HIGH (ref 70–99)
Glucose-Capillary: 187 mg/dL — ABNORMAL HIGH (ref 70–99)
Glucose-Capillary: 198 mg/dL — ABNORMAL HIGH (ref 70–99)
Glucose-Capillary: 238 mg/dL — ABNORMAL HIGH (ref 70–99)

## 2023-03-08 LAB — CBC WITH DIFFERENTIAL/PLATELET
Abs Immature Granulocytes: 0.05 10*3/uL (ref 0.00–0.07)
Basophils Absolute: 0 10*3/uL (ref 0.0–0.1)
Basophils Relative: 1 %
Eosinophils Absolute: 0.6 10*3/uL — ABNORMAL HIGH (ref 0.0–0.5)
Eosinophils Relative: 8 %
HCT: 25.3 % — ABNORMAL LOW (ref 36.0–46.0)
Hemoglobin: 8.5 g/dL — ABNORMAL LOW (ref 12.0–15.0)
Immature Granulocytes: 1 %
Lymphocytes Relative: 12 %
Lymphs Abs: 0.8 10*3/uL (ref 0.7–4.0)
MCH: 29.6 pg (ref 26.0–34.0)
MCHC: 33.6 g/dL (ref 30.0–36.0)
MCV: 88.2 fL (ref 80.0–100.0)
Monocytes Absolute: 0.8 10*3/uL (ref 0.1–1.0)
Monocytes Relative: 12 %
Neutro Abs: 4.4 10*3/uL (ref 1.7–7.7)
Neutrophils Relative %: 66 %
Platelets: 178 10*3/uL (ref 150–400)
RBC: 2.87 MIL/uL — ABNORMAL LOW (ref 3.87–5.11)
RDW: 18.5 % — ABNORMAL HIGH (ref 11.5–15.5)
WBC: 6.6 10*3/uL (ref 4.0–10.5)
nRBC: 0.3 % — ABNORMAL HIGH (ref 0.0–0.2)

## 2023-03-08 LAB — BASIC METABOLIC PANEL
Anion gap: 8 (ref 5–15)
BUN: 32 mg/dL — ABNORMAL HIGH (ref 8–23)
CO2: 29 mmol/L (ref 22–32)
Calcium: 8.1 mg/dL — ABNORMAL LOW (ref 8.9–10.3)
Chloride: 101 mmol/L (ref 98–111)
Creatinine, Ser: 1 mg/dL (ref 0.44–1.00)
GFR, Estimated: 59 mL/min — ABNORMAL LOW (ref >60)
Glucose, Bld: 170 mg/dL — ABNORMAL HIGH (ref 70–99)
Potassium: 3.4 mmol/L — ABNORMAL LOW (ref 3.5–5.1)
Sodium: 138 mmol/L (ref 135–145)

## 2023-03-08 LAB — MAGNESIUM: Magnesium: 1.9 mg/dL (ref 1.7–2.4)

## 2023-03-08 MED ORDER — AMIODARONE HCL 200 MG PO TABS
200.0000 mg | ORAL_TABLET | Freq: Every day | ORAL | Status: DC
  Administered 2023-03-08 – 2023-03-15 (×8): 200 mg via ORAL
  Filled 2023-03-08 (×8): qty 1

## 2023-03-08 MED ORDER — INSULIN DETEMIR 100 UNIT/ML ~~LOC~~ SOLN
18.0000 [IU] | Freq: Every day | SUBCUTANEOUS | Status: DC
  Filled 2023-03-08: qty 0.18

## 2023-03-08 MED ORDER — POTASSIUM CHLORIDE 20 MEQ PO PACK
20.0000 meq | PACK | ORAL | Status: DC
  Administered 2023-03-08: 20 meq
  Filled 2023-03-08: qty 1

## 2023-03-08 MED ORDER — MAGNESIUM SULFATE IN D5W 1-5 GM/100ML-% IV SOLN
1.0000 g | Freq: Once | INTRAVENOUS | Status: AC
  Administered 2023-03-08: 1 g via INTRAVENOUS
  Filled 2023-03-08: qty 100

## 2023-03-08 MED ORDER — INSULIN ASPART 100 UNIT/ML IJ SOLN
0.0000 [IU] | Freq: Three times a day (TID) | INTRAMUSCULAR | Status: DC
  Administered 2023-03-08: 2 [IU] via SUBCUTANEOUS

## 2023-03-08 MED ORDER — INSULIN DETEMIR 100 UNIT/ML ~~LOC~~ SOLN: 15.0000 [IU] | Freq: Every day | SUBCUTANEOUS | Status: DC

## 2023-03-08 MED ORDER — LOSARTAN POTASSIUM 25 MG PO TABS
25.0000 mg | ORAL_TABLET | Freq: Every day | ORAL | Status: DC
  Administered 2023-03-08 – 2023-03-09 (×2): 25 mg via ORAL
  Filled 2023-03-08 (×2): qty 1

## 2023-03-08 MED ORDER — HYDRALAZINE HCL 20 MG/ML IJ SOLN
10.0000 mg | INTRAMUSCULAR | Status: DC | PRN
  Administered 2023-03-09: 10 mg via INTRAVENOUS
  Filled 2023-03-08: qty 1

## 2023-03-08 MED ORDER — FUROSEMIDE 10 MG/ML IJ SOLN
40.0000 mg | Freq: Two times a day (BID) | INTRAMUSCULAR | Status: DC
  Administered 2023-03-08 – 2023-03-09 (×3): 40 mg via INTRAVENOUS
  Filled 2023-03-08 (×4): qty 4

## 2023-03-08 MED ORDER — POTASSIUM CHLORIDE CRYS ER 10 MEQ PO TBCR
20.0000 meq | EXTENDED_RELEASE_TABLET | ORAL | Status: AC
  Administered 2023-03-08 (×2): 20 meq via ORAL
  Filled 2023-03-08 (×2): qty 2

## 2023-03-08 MED ORDER — METOPROLOL SUCCINATE ER 100 MG PO TB24
100.0000 mg | ORAL_TABLET | Freq: Every day | ORAL | Status: DC
  Administered 2023-03-08 – 2023-03-15 (×8): 100 mg via ORAL
  Filled 2023-03-08 (×5): qty 1
  Filled 2023-03-08 (×2): qty 2
  Filled 2023-03-08: qty 1

## 2023-03-08 MED ORDER — POTASSIUM CHLORIDE CRYS ER 10 MEQ PO TBCR
10.0000 meq | EXTENDED_RELEASE_TABLET | Freq: Two times a day (BID) | ORAL | Status: DC
  Administered 2023-03-08 – 2023-03-15 (×15): 10 meq via ORAL
  Filled 2023-03-08 (×15): qty 1

## 2023-03-08 NOTE — Progress Notes (Addendum)
[address]       [place] [address]             [phone]     6 Days Post-Op Procedure(s) (LRB): CORONARY ARTERY BYPASS GRAFTING (CABG) TIMES THREE USING THE LEFT INTERNAL MAMMARY ARTERY (LIMA) AND ENDOSCOPICALLY HARVESTED RIGHT GREATER SAPHENOUS VEIN (N/A) AORTIC VALVE REPLACEMENT (AVR) USING EDWARDS INSPIRIS RESILIA 23 MM AORTIC VALVE (N/A) TRANSESOPHAGEAL ECHOCARDIOGRAM (N/A) Subjective: Feeling better each day  Objective: Vital signs in last 24 hours: Temp:  [97.7 F (36.5 C)-99.1 F (37.3 C)] 98.2 F (36.8 C) (03/12 0620) Pulse Rate:  [68-101] 79 (03/12 0600) Cardiac Rhythm: Normal sinus rhythm (03/12 0000) Resp:  [12-31] 19 (03/12 0600) BP: (127-186)/(49-98) 186/66 (03/12 0600) SpO2:  [90 %-100 %] 95 % (03/12 0600) Weight:  [92.9 kg] 92.9 kg (03/12 0400)  Hemodynamic parameters for last 24 hours:    Intake/Output from previous day: 03/11 0701 - 03/12 0700 In: 360 [P.O.:360] Out: 3850 [Urine:3850] Intake/Output this shift: No intake/output data recorded.  General appearance: alert, cooperative, and no distress Heart: regular rate and rhythm Lungs: mildly dim in bases Abdomen: benign Extremities: no edema Wound: incis healing well, moderate right thigh echymosis without hematoma  Lab Results: Recent Labs    03/07/23 0334 03/08/23 0035  WBC 7.6 6.6  HGB 8.1* 8.5*  HCT 25.2* 25.3*  PLT 163 178   BMET:  Recent Labs    03/07/23 0334 03/07/23 0953  NA 138 139  K 3.5 3.6  CL 103 102  CO2 29 26  GLUCOSE 123* 168*  BUN 42* 40*  CREATININE 1.17* 1.12*  CALCIUM 8.2* 8.5*    PT/INR: No results for input(s): "LABPROT", "INR" in the last 72 hours. ABG    Component Value Date/Time   PHART 7.352 03/05/2023 0117   HCO3 22.5 03/05/2023 0117   TCO2 24 03/05/2023 0117   ACIDBASEDEF 3.0 (H) 03/05/2023 0117   O2SAT 74.8 03/07/2023 0334   CBG (last 3)  Recent Labs    03/07/23 1634 03/07/23 2119 03/08/23 0618  GLUCAP 221*  145* 104*    Meds Scheduled Meds:  amiodarone  200 mg Oral Daily   aspirin EC  325 mg Oral Daily   Or   aspirin  324 mg Per Tube Daily   Chlorhexidine Gluconate Cloth  6 each Topical Daily   docusate sodium  200 mg Oral Daily   furosemide  40 mg Intravenous Q12H   insulin aspart  0-5 Units Subcutaneous QHS   insulin detemir  20 Units Subcutaneous Daily   lactose free nutrition  237 mL Oral TID WC   losartan  25 mg Oral Daily   metoprolol succinate  100 mg Oral Daily   multivitamin with minerals  1 tablet Oral Daily   pantoprazole  40 mg Oral Daily   polyethylene glycol  17 g Oral Daily   potassium chloride  10 mEq Oral BID   senna  2 tablet Oral QHS   sertraline  25 mg Oral Daily   Continuous Infusions:  magnesium sulfate bolus IVPB 1 g (03/08/23 0702)   PRN Meds:.dextrose, ipratropium-albuterol, metoprolol tartrate, morphine injection, ondansetron (ZOFRAN) IV, mouth rinse, oxyCODONE, traMADol  Xrays DG Chest Port 1 View  Result Date: 03/07/2023 CLINICAL DATA:  Atelectasis EXAM: PORTABLE CHEST 1 VIEW COMPARISON:  Chest radiograph 03/06/2023 FINDINGS: Status post median sternotomy an aortic valve appearance. Right-sided central venous catheter sheath remains. Unchanged small bilateral pleural effusions. Compared to prior exam there is  slightly improved aeration of the bilateral lung bases. Cardiac and mediastinal contours are unchanged. Visualized upper abdomen is notable for surgical clips in the left upper quadrant. No radiographically apparent displaced rib fractures. IMPRESSION: Slightly improved aeration of the bilateral lung bases. Electronically Signed   By: Marin Roberts M.D.   On: 03/07/2023 08:16    Assessment/Plan: S/P Procedure(s) (LRB): CORONARY ARTERY BYPASS GRAFTING (CABG) TIMES THREE USING THE LEFT INTERNAL MAMMARY ARTERY (LIMA) AND ENDOSCOPICALLY HARVESTED RIGHT GREATER SAPHENOUS VEIN (N/A) AORTIC VALVE REPLACEMENT (AVR) USING EDWARDS INSPIRIS RESILIA 23 MM AORTIC  VALVE (N/A) TRANSESOPHAGEAL ECHOCARDIOGRAM (N/A) POD#6 AVR/CABG  1 Tmax 99.1, sinus rhythm s BP 120's-180's- cozaar added, amio reduced 2 O2 sats good on 1 liter 3 SNF - process initiated on PT recs for short term 4 remains with some volume overload- cont diuretics 5 BS- one reading over 200, transition to home meds at d/c - same insulin for now w/ SSI as well 6 MG++ 1.9, replacement ordered to keep  >2.0 7 H/H stable 8 CXR bibasilar aeration slowly improving 9 stable for tx to 4 e   LOS: 6 days    [name] PA-C Pager  [phone] 03/08/2023  Agree with above. If tolerates higher bb will stop amiodarone tomorrow

## 2023-03-08 NOTE — Progress Notes (Signed)
Physical Therapy Treatment Patient Details Name: Joyce Potter MRN: [id number] DOB: Jun 22, 1946 Today's Date: 03/08/2023   History of Present Illness Pt is 77 yo female admitted 03/02/23 for CABG x 2 and AVR. PMH - CAD, severe aortic stenosis, HTN, DM, lt eye central blindness, GIB    PT Comments    Pt pleasant with improved gait tolerance but remains limited by balance and decreased awareness and recall of precautions. Pt without assist at home and needs to be able to recall precautions and be mod I to return home safely with ST-SNF still appropriate. Pt educated for all precautions, IS use with pt achieving 600cc, HEP and gait progression. Will continue to follow.  HR 94 at rest and up to 130 with gait Pre gait 171/63 (92) Post gait 189/62   Recommendations for follow up therapy are one component of a multi-disciplinary discharge planning process, led by the attending physician.  Recommendations may be updated based on patient status, additional functional criteria and insurance authorization.  Follow Up Recommendations  Skilled nursing-short term rehab (<3 hours/day) Can patient physically be transported by private vehicle: Yes   Assistance Recommended at Discharge Frequent or constant Supervision/Assistance  Patient can return home with the following A little help with walking and/or transfers;A little help with bathing/dressing/bathroom;Assistance with cooking/housework;Assist for transportation;Help with stairs or ramp for entrance   Equipment Recommendations  None recommended by PT    Recommendations for Other Services       Precautions / Restrictions Precautions Precautions: Sternal;Fall     Mobility  Bed Mobility Overal bed mobility: Needs Assistance Bed Mobility: Sit to Supine       Sit to supine: Min assist   General bed mobility comments: min assist to lift legs to surface with cues for precautions and sequence    Transfers Overall transfer level: Needs  assistance   Transfers: Sit to/from Stand Sit to Stand: Min guard           General transfer comment: cues for hand placement and safety to stand from recliner x 1 and from EOB x 5 without physical assist    Ambulation/Gait Ambulation/Gait assistance: Min assist Gait Distance (Feet): 400 Feet Assistive device: Rolling walker (2 wheels) Gait Pattern/deviations: Step-through pattern, Decreased stride length   Gait velocity interpretation: >2.62 ft/sec, indicative of community ambulatory   General Gait Details: pt impulsive with quick speed needing cues to slow gait, control RW and step into RW. Pt with tendency to get over balanced toward right of RW particularly with dual task, directional cues to return to room.   Stairs             Wheelchair Mobility    Modified Rankin (Stroke Patients Only)       Balance Overall balance assessment: Needs assistance Sitting-balance support: No upper extremity supported, Feet supported Sitting balance-Leahy Scale: Fair     Standing balance support: Bilateral upper extremity supported, Reliant on assistive device for balance Standing balance-Leahy Scale: Poor Standing balance comment: RW for standing and gait                            Cognition Arousal/Alertness: Awake/alert Behavior During Therapy: Flat affect Overall Cognitive Status: Impaired/Different from baseline Area of Impairment: Memory                     Memory: Decreased recall of precautions         General Comments: pt able  to recall 2/4 precaution beginning of session and 3/4 end of session        Exercises General Exercises - Lower Extremity Long Arc Quad: AROM, Both, Seated, 15 reps Hip Flexion/Marching: AROM, Both, Seated, 15 reps    General Comments        Pertinent Vitals/Pain Pain Assessment Pain Assessment: No/denies pain    Home Living                          Prior Function            PT Goals  (current goals can now be found in the care plan section) Progress towards PT goals: Progressing toward goals    Frequency    Min 3X/week      PT Plan Current plan remains appropriate    Co-evaluation              AM-PAC PT "6 Clicks" Mobility   Outcome Measure  Help needed turning from your back to your side while in a flat bed without using bedrails?: A Little Help needed moving from lying on your back to sitting on the side of a flat bed without using bedrails?: A Little Help needed moving to and from a bed to a chair (including a wheelchair)?: A Little Help needed standing up from a chair using your arms (e.g., wheelchair or bedside chair)?: A Little Help needed to walk in hospital room?: A Little Help needed climbing 3-5 steps with a railing? : Total 6 Click Score: 16    End of Session Equipment Utilized During Treatment: Gait belt Activity Tolerance: Patient tolerated treatment well Patient left: in bed;with call bell/phone within reach;with nursing/sitter in room Nurse Communication: Mobility status PT Visit Diagnosis: Unsteadiness on feet (R26.81);Other abnormalities of gait and mobility (R26.89);Muscle weakness (generalized) (M62.81)     Time: [id number] PT Time Calculation (min) (ACUTE ONLY): 17 min  Charges:  $Gait Training: 8-22 mins                     Bayard Males, PT Acute Rehabilitation Services Office: [place] 03/08/2023, 9:11 AM

## 2023-03-08 NOTE — Progress Notes (Signed)
NAME:  Joyce Potter, MRN:  [id number], DOB:  1946-10-01, LOS: 6 ADMISSION DATE:  03/02/2023, CONSULTATION DATE:  3/6 REFERRING MD:  Dr. Lavonna Monarch, CHIEF COMPLAINT:  AVR w/ CABG   History of Present Illness:  Patient is a 77 year old female with pertinent PMH CAD (stent placed in 2012), severe aortic stenosis, carotid stenosis, HLD, HTN, IDDM, OSA, asthma, PUD, anemia with hx of gi bleeding requiring iron transfusions presents to [facility] ED on 3/6 for AVR w/ CABG.  Patient seen by cardiology on January 2024 for further eval of continual chest pain and SOB with exertion since July 2023. Patient had recently received a cardiac catheterization in July 2023 showing diffuse moderate disease and high-grade disease of the long diagonal.  Patient was treated with medical therapy. EKG showing normal rate with nonspecific ST abnormality.  Echo 12/2022 LVEF 60 to 65%; severe aortic valve stenosis.  Patient evaluated by T CTS and plan for AVR with CABG.  On 3/6 patient presents to [facility] for AVR with CABG x2.  S/p procedure patient intubated/sedated.  PCCM consulted.  Pertinent  Medical History   Past Medical History:  Diagnosis Date   ADHD (attention deficit hyperactivity disorder)    Anemia    iron deficiency   Anxiety    Aortic stenosis    Arthritis    Asthma    Basal cell carcinoma    CAD (coronary artery disease)    s/p Left circumflex stent in 2012   Carotid stenosis    Cataract    Heart murmur    High cholesterol    HTN (hypertension)    Hyperlipidemia    IDDM (insulin dependent diabetes mellitus)    Multiple thyroid nodules    Benign   Neuromuscular disorder (HCC)    Peptic ulcer disease    Pneumonia 1952   PONV (postoperative nausea and vomiting)    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 Bleeding eventually stopped.  3/7 Extubated.   Interim History / Subjective:  Patient is on room air Stated feeling much improved Denies shortness of breath and chest pain Objective   Blood pressure (!) 157/56, pulse 90, temperature 98.2 F (36.8 C), temperature source Oral, resp. rate (!) 26, height '5\' 3"'$  (1.6 m), weight 92.9 kg, SpO2 (!) 89 %.        Intake/Output Summary (Last 24 hours) at 03/08/2023 0850 Last data filed at 03/08/2023 0400 Gross per 24 hour  Intake 360 ml  Output 3850 ml  Net -3490 ml   Filed Weights   03/06/23 0600 03/07/23 0600 03/08/23 0400  Weight: 96.1 kg 92.8 kg 92.9 kg    Examination: Physical exam: General: Chronically ill-appearing elderly obese female, lying on the bed HEENT: [place]/AT, eyes anicteric.  moist mucus membranes Neuro: Alert, awake following commands Chest: Coarse breath sounds, no wheezes or rhonchi Heart: Regular rate and rhythm, no murmurs or gallops Abdomen: Soft, nontender, nondistended, bowel sounds present Skin: No rash  Labs and images were reviewed  Assessment & Plan:  Acute hypoxic respiratory failure due to severe postop atelectasis Currently on RA Now frequently using center spirometry She ambulated with assistance of PT and OT They recommend skilled care nursing facility placement upon discharge  Coronary artery disease s/p CABG x  2 Severe aortic stenosis s/p AVR Paroxysmal A-fib with RVR Continue pain control with Tylenol, tramadol and oxycodone Remains in sinus rhythm Continue amiodarone  Restarted on Toprol Continue diuresis, with Lasix Continue aspirin  Physical Deconditioning  Continue PT/OT  Encourage Ambulation in unit  Acute blood loss anemia on anemia of chronic disease Thrombocytopenia Status post blood transfusion H&H and platelet counts remained stable Monitor H&H and transfuse less than 7  Acute kidney injury due to ischemic ATN Hypokalemia Serum creatinine continued  improvement down to 1 Monitor intake and output Avoid nephrotoxic agents Maintain MAP > 65 Continue aggressive electrolyte supplement  HTN Blood pressure has improved Continue Cozaar  Diabetes type 2 Blood sugars are controlled Patient's hemoglobin A1c 7.3 Decrease Levemir to 15 units Continue sliding scale insulin  Morbid obesity Dietitian is following   Best Practice (right click and "Reselect all SmartList Selections" daily)   Diet/type: Consistent carbohydrate diet DVT prophylaxis: SCD - hold on LMWH due to ongoing anemia GI prophylaxis: PPI Lines: N/A Foley: N/A Code Status:  full code Last date of multidisciplinary goals of care discussion [per primary]    Jacky Kindle, MD [facility] See Amion for pager If no response to pager, please call [phone] until 7pm After 7pm, Please call E-link [phone]

## 2023-03-08 NOTE — Progress Notes (Signed)
Patient ID: Joyce Potter, female   DOB: October 16, 1946, 77 y.o.   MRN: [id number] TCTS Evening Rounds:  Hemodynamically stable in sinus rhythm.  Good  urine output.  Waiting  on 4E bed.

## 2023-03-08 NOTE — TOC Progression Note (Signed)
Transition of Care [facility]) - Progression Note    Patient Details  Name: Joyce Potter MRN: [id number] Date of Birth: [birth year]  Transition of Care [facility]) CM/SW [place], [place] Phone Number: 03/08/2023, 5:15 PM  Clinical Narrative:     CSW spoke with patient at bedside and provided SNF bed offers. Patient is going to look over SNF bed offers with her daughter and will confirm SNF bed offer with CSW tomorrow. CSW will continue to follow and assist with patients dc planning needs.  Expected Discharge Plan: [place] Barriers to Discharge: Continued Medical Work up  Expected Discharge Plan and Services In-house Referral: Clinical Social Work Discharge Planning Services: CM Consult   Living arrangements for the past 2 months: Single Family Home                                       Social Determinants of Health (SDOH) Interventions SDOH Screenings   Food Insecurity: No Food Insecurity (02/18/2022)  Housing: Low Risk  (12/16/2021)  Transportation Needs: No Transportation Needs (05/31/2022)  Alcohol Screen: Low Risk  (12/16/2021)  Depression (PHQ2-9): Low Risk  (05/31/2022)  Financial Resource Strain: Low Risk  (05/31/2022)  Social Connections: Socially Integrated (12/16/2021)  Stress: No Stress Concern Present (02/18/2022)  Tobacco Use: Medium Risk (03/03/2023)    Readmission Risk Interventions     No data to display

## 2023-03-09 DIAGNOSIS — Z951 Presence of aortocoronary bypass graft: Secondary | ICD-10-CM | POA: Diagnosis not present

## 2023-03-09 DIAGNOSIS — I350 Nonrheumatic aortic (valve) stenosis: Secondary | ICD-10-CM | POA: Diagnosis not present

## 2023-03-09 DIAGNOSIS — I25118 Atherosclerotic heart disease of native coronary artery with other forms of angina pectoris: Secondary | ICD-10-CM | POA: Diagnosis not present

## 2023-03-09 DIAGNOSIS — J9601 Acute respiratory failure with hypoxia: Secondary | ICD-10-CM | POA: Diagnosis not present

## 2023-03-09 LAB — CBC
HCT: 28.2 % — ABNORMAL LOW (ref 36.0–46.0)
Hemoglobin: 9.2 g/dL — ABNORMAL LOW (ref 12.0–15.0)
MCH: 28.8 pg (ref 26.0–34.0)
MCHC: 32.6 g/dL (ref 30.0–36.0)
MCV: 88.4 fL (ref 80.0–100.0)
Platelets: 281 10*3/uL (ref 150–400)
RBC: 3.19 MIL/uL — ABNORMAL LOW (ref 3.87–5.11)
RDW: 17.6 % — ABNORMAL HIGH (ref 11.5–15.5)
WBC: 7.4 10*3/uL (ref 4.0–10.5)
nRBC: 0 % (ref 0.0–0.2)

## 2023-03-09 LAB — BASIC METABOLIC PANEL
Anion gap: 11 (ref 5–15)
BUN: 24 mg/dL — ABNORMAL HIGH (ref 8–23)
CO2: 28 mmol/L (ref 22–32)
Calcium: 8.9 mg/dL (ref 8.9–10.3)
Chloride: 95 mmol/L — ABNORMAL LOW (ref 98–111)
Creatinine, Ser: 1.04 mg/dL — ABNORMAL HIGH (ref 0.44–1.00)
GFR, Estimated: 56 mL/min — ABNORMAL LOW (ref >60)
Glucose, Bld: 175 mg/dL — ABNORMAL HIGH (ref 70–99)
Potassium: 4.7 mmol/L (ref 3.5–5.1)
Sodium: 134 mmol/L — ABNORMAL LOW (ref 135–145)

## 2023-03-09 LAB — GLUCOSE, CAPILLARY
Glucose-Capillary: 129 mg/dL — ABNORMAL HIGH (ref 70–99)
Glucose-Capillary: 204 mg/dL — ABNORMAL HIGH (ref 70–99)
Glucose-Capillary: 234 mg/dL — ABNORMAL HIGH (ref 70–99)
Glucose-Capillary: 184 mg/dL — ABNORMAL HIGH (ref 70–99)

## 2023-03-09 MED ORDER — SODIUM CHLORIDE 0.9 % IV SOLN: 250.0000 mL | INTRAVENOUS | Status: DC | PRN

## 2023-03-09 MED ORDER — INSULIN DETEMIR 100 UNIT/ML ~~LOC~~ SOLN
24.0000 [IU] | Freq: Every day | SUBCUTANEOUS | Status: DC
  Filled 2023-03-09: qty 0.24

## 2023-03-09 MED ORDER — SODIUM CHLORIDE 0.9% FLUSH
3.0000 mL | Freq: Two times a day (BID) | INTRAVENOUS | Status: DC
  Administered 2023-03-09 – 2023-03-15 (×12): 3 mL via INTRAVENOUS

## 2023-03-09 MED ORDER — ~~LOC~~ CARDIAC SURGERY, PATIENT & FAMILY EDUCATION: Freq: Once | Status: DC

## 2023-03-09 MED ORDER — INSULIN ASPART 100 UNIT/ML IJ SOLN
0.0000 [IU] | Freq: Three times a day (TID) | INTRAMUSCULAR | Status: DC
  Administered 2023-03-09: 5 [IU] via SUBCUTANEOUS
  Administered 2023-03-09: 2 [IU] via SUBCUTANEOUS
  Administered 2023-03-09 – 2023-03-10 (×3): 5 [IU] via SUBCUTANEOUS
  Administered 2023-03-11: 2 [IU] via SUBCUTANEOUS
  Administered 2023-03-11: 3 [IU] via SUBCUTANEOUS
  Administered 2023-03-11 – 2023-03-12 (×2): 5 [IU] via SUBCUTANEOUS
  Administered 2023-03-12 – 2023-03-13 (×3): 2 [IU] via SUBCUTANEOUS
  Administered 2023-03-13: 3 [IU] via SUBCUTANEOUS
  Administered 2023-03-13: 5 [IU] via SUBCUTANEOUS
  Administered 2023-03-14: 3 [IU] via SUBCUTANEOUS
  Administered 2023-03-14: 8 [IU] via SUBCUTANEOUS
  Administered 2023-03-14: 2 [IU] via SUBCUTANEOUS
  Administered 2023-03-15 (×2): 5 [IU] via SUBCUTANEOUS

## 2023-03-09 MED ORDER — SODIUM CHLORIDE 0.9% FLUSH: 3.0000 mL | INTRAVENOUS | Status: DC | PRN

## 2023-03-09 MED ORDER — INSULIN DETEMIR 100 UNIT/ML ~~LOC~~ SOLN
20.0000 [IU] | Freq: Every day | SUBCUTANEOUS | Status: DC
  Administered 2023-03-09 – 2023-03-11 (×3): 20 [IU] via SUBCUTANEOUS
  Filled 2023-03-09 (×4): qty 0.2

## 2023-03-09 MED ORDER — INSULIN ASPART 100 UNIT/ML IJ SOLN
0.0000 [IU] | Freq: Every day | INTRAMUSCULAR | Status: DC
  Administered 2023-03-11 – 2023-03-13 (×2): 2 [IU] via SUBCUTANEOUS

## 2023-03-09 NOTE — Progress Notes (Signed)
[address]       [place] [address]             [phone]      7 Days Post-Op  Procedure(s) (LRB): CORONARY ARTERY BYPASS GRAFTING (CABG) TIMES THREE USING THE LEFT INTERNAL MAMMARY ARTERY (LIMA) AND ENDOSCOPICALLY HARVESTED RIGHT GREATER SAPHENOUS VEIN (N/A) AORTIC VALVE REPLACEMENT (AVR) USING EDWARDS INSPIRIS RESILIA 23 MM AORTIC VALVE (N/A) TRANSESOPHAGEAL ECHOCARDIOGRAM (N/A)  Total Length of Stay:  LOS: 7 days   SUBJECTIVE: Feels better Walking every day Less edematous  Vitals:   03/09/23 0615 03/09/23 0630  BP:  (!) 149/60  Pulse: 76 77  Resp: (!) 30 (!) 24  Temp:    SpO2: 96% 97%    Intake/Output      03/12 0701 03/13 0700 03/13 0701 03/14 0700   P.O.     Total Intake(mL/kg)     Urine (mL/kg/hr) 300 (0.1)    Stool     Total Output 300    Net -300         Urine Occurrence 1 x         CBC    Component Value Date/Time   WBC 6.6 03/08/2023 0035   RBC 2.87 (L) 03/08/2023 0035   HGB 8.5 (L) 03/08/2023 0035   HGB 12.4 01/21/2023 1213   HCT 25.3 (L) 03/08/2023 0035   HCT 38.7 01/21/2023 1213   PLT 178 03/08/2023 0035   PLT 224 01/21/2023 1213   MCV 88.2 03/08/2023 0035   MCV 83 01/21/2023 1213   MCV 84 12/17/2014 1222   MCH 29.6 03/08/2023 0035   MCHC 33.6 03/08/2023 0035   RDW 18.5 (H) 03/08/2023 0035   RDW 16.7 (H) 01/21/2023 1213   RDW 14.1 12/17/2014 1222   LYMPHSABS 0.8 03/08/2023 0035   LYMPHSABS 1.6 12/17/2014 1222   MONOABS 0.8 03/08/2023 0035   MONOABS 0.5 12/17/2014 1222   EOSABS 0.6 (H) 03/08/2023 0035   EOSABS 0.3 12/17/2014 1222   BASOSABS 0.0 03/08/2023 0035   BASOSABS 0.1 12/17/2014 1222   CMP     Component Value Date/Time   NA 138 03/08/2023 0035   NA 142 01/21/2023 1213   NA 143 05/04/2013 0614   K 3.4 (L) 03/08/2023 0035   K 3.8 05/04/2013 0614   CL 101 03/08/2023 0035   CL 110 (H) 05/04/2013 0614   CO2 29 03/08/2023 0035   CO2 27 05/04/2013 0614   GLUCOSE 170 (H) 03/08/2023 0035    GLUCOSE 130 (H) 05/04/2013 0614   BUN 32 (H) 03/08/2023 0035   BUN 15 01/21/2023 1213   BUN 12 05/04/2013 0614   CREATININE 1.00 03/08/2023 0035   CREATININE 0.79 08/27/2014 1445   CALCIUM 8.1 (L) 03/08/2023 0035   CALCIUM 8.2 (L) 05/04/2013 0614   PROT 5.6 (L) 03/07/2023 0953   PROT 7.6 08/27/2014 1445   ALBUMIN 2.6 (L) 03/07/2023 0953   ALBUMIN 3.5 08/27/2014 1445   AST 19 03/07/2023 0953   AST 27 08/27/2014 1445   ALT 6 03/07/2023 0953   ALT 27 08/27/2014 1445   ALKPHOS 53 03/07/2023 0953   ALKPHOS 101 08/27/2014 1445   BILITOT 1.3 (H) 03/07/2023 0953   BILITOT 0.4 08/27/2014 1445   GFRNONAA 59 (L) 03/08/2023 0035   GFRNONAA >60 08/27/2014 1445   GFRAA >60 07/24/2020 0417   GFRAA >60 08/27/2014 1445   ABG    Component Value Date/Time   PHART 7.352 03/05/2023 0117   PCO2ART  40.4 03/05/2023 0117   PO2ART 68 (L) 03/05/2023 0117   HCO3 22.5 03/05/2023 0117   TCO2 24 03/05/2023 0117   ACIDBASEDEF 3.0 (H) 03/05/2023 0117   O2SAT 74.8 03/07/2023 0334   CBG (last 3)  Recent Labs    03/08/23 1546 03/08/23 2200 03/09/23 0643  GLUCAP 198* 238* 129*  EXAM Lungs: overall clear Card: RR Incision clean Ext: less edema, warm Neuro: alert no focal deficits   ASSESSMENT: POD #7 SP Cabg/AVR Doing very well Cr normalized. Cont IV lasix today Wean O2 Ambulate Awaiting SNF   [name], MD 03/09/2023

## 2023-03-09 NOTE — Progress Notes (Signed)
NAME:  Joyce Potter, MRN:  [id number], DOB:  06-Nov-1946, LOS: 7 ADMISSION DATE:  03/02/2023, CONSULTATION DATE:  3/6 REFERRING MD:  Dr. Lavonna Monarch, CHIEF COMPLAINT:  AVR w/ CABG   History of Present Illness:  Patient is a 77 year old female with pertinent PMH CAD (stent placed in 2012), severe aortic stenosis, carotid stenosis, HLD, HTN, IDDM, OSA, asthma, PUD, anemia with hx of gi bleeding requiring iron transfusions presents to [facility] ED on 3/6 for AVR w/ CABG.  Patient seen by cardiology on January 2024 for further eval of continual chest pain and SOB with exertion since July 2023. Patient had recently received a cardiac catheterization in July 2023 showing diffuse moderate disease and high-grade disease of the long diagonal.  Patient was treated with medical therapy. EKG showing normal rate with nonspecific ST abnormality.  Echo 12/2022 LVEF 60 to 65%; severe aortic valve stenosis.  Patient evaluated by T CTS and plan for AVR with CABG.  On 3/6 patient presents to [facility] for AVR with CABG x2.  S/p procedure patient intubated/sedated.  PCCM consulted.  Pertinent  Medical History   Past Medical History:  Diagnosis Date   ADHD (attention deficit hyperactivity disorder)    Anemia    iron deficiency   Anxiety    Aortic stenosis    Arthritis    Asthma    Basal cell carcinoma    CAD (coronary artery disease)    s/p Left circumflex stent in 2012   Carotid stenosis    Cataract    Heart murmur    High cholesterol    HTN (hypertension)    Hyperlipidemia    IDDM (insulin dependent diabetes mellitus)    Multiple thyroid nodules    Benign   Neuromuscular disorder (HCC)    Peptic ulcer disease    Pneumonia 1952   PONV (postoperative nausea and vomiting)    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 Bleeding eventually stopped.  3/7 Extubated.   Interim History / Subjective:  Sitting up in chair. Feels good. Awaiting 4E bed placement.  Objective   Blood pressure (!) 149/60, pulse 77, temperature 98.5 F (36.9 C), temperature source Oral, resp. rate (!) 24, height '5\' 3"'$  (1.6 m), weight 91.5 kg, SpO2 97 %.        Intake/Output Summary (Last 24 hours) at 03/09/2023 0751 Last data filed at 03/08/2023 1000 Gross per 24 hour  Intake --  Output 300 ml  Net -300 ml    Filed Weights   03/07/23 0600 03/08/23 0400 03/09/23 0500  Weight: 92.8 kg 92.9 kg 91.5 kg    Examination: General: Adult female, sitting up in chair, in NAD. Neuro: A&O x 3, no deficits. HEENT: [place]/AT. Sclerae anicteric. EOMI. Cardiovascular: Sternotomy incision healing well. RRR, no M/R/G.  Lungs: Respirations even and unlabored.  CTA bilaterally, No W/R/R. Abdomen: BS x 4, soft, NT/ND.  Musculoskeletal: No gross deformities, no edema.  Skin: Intact, warm, no rashes.  Assessment & Plan:  Acute hypoxic respiratory failure due to severe postop atelectasis - resolved and now on room air. Continue IS/bronchial hygiene Mobilize as able  Coronary artery disease s/p CABG x 2 Severe aortic stenosis s/p AVR Paroxysmal A-fib with RVR Hypertension Continue pain control with Tylenol, tramadol and  oxycodone Continue amiodarone, ASA Continue diuresis Continue Hydralazine PRN, Cozaar, Toprol, Lopressor PRN  Physical Deconditioning  Continue PT/OT  Encourage Ambulation in unit  Acute blood loss anemia on anemia of chronic disease - s/p transfusion Thrombocytopenia H&H and platelet counts remained stable Monitor H&H and transfuse less than 7  Diabetes type 2 Continue SSI, Levemir  Morbid obesity Dietitian is following   Best Practice (right click and "Reselect all SmartList Selections" daily)   Diet/type: Regular as ordered DVT prophylaxis: SCD GI prophylaxis: PPI Lines:  N/A Foley: N/A Code Status:  full code Last date of multidisciplinary goals of care discussion [per primary]     Montey Hora, [facility] For pager details, please see AMION or use Epic chat  After 1900, please call [place] for cross coverage needs 03/09/2023, 7:58 AM

## 2023-03-09 NOTE — Progress Notes (Signed)
CARDIAC REHAB PHASE I   PRE:  Rate/Rhythm: 86 SR    BP: sitting 149/54    SpO2: 93 RA  MODE:  Ambulation: 430 ft   POST:  Rate/Rhythm: 118 ST    BP: sitting 191/63     SpO2: 95 RA  Pt moved out of bed with verbal cues and ambulated with RW. Steady, needed verbal cues for using RW. HR increased with distance with resultant SOB. Rest x2. To bed. Encouraged another walk and IS.  [id number]   Yves Dill BS, ACSM-CEP 03/09/2023 1:36 PM

## 2023-03-10 ENCOUNTER — Inpatient Hospital Stay (HOSPITAL_COMMUNITY): Payer: Medicare HMO

## 2023-03-10 LAB — GLUCOSE, CAPILLARY
Glucose-Capillary: 107 mg/dL — ABNORMAL HIGH (ref 70–99)
Glucose-Capillary: 206 mg/dL — ABNORMAL HIGH (ref 70–99)
Glucose-Capillary: 206 mg/dL — ABNORMAL HIGH (ref 70–99)
Glucose-Capillary: 149 mg/dL — ABNORMAL HIGH (ref 70–99)

## 2023-03-10 LAB — BASIC METABOLIC PANEL
Anion gap: 8 (ref 5–15)
BUN: 23 mg/dL (ref 8–23)
CO2: 29 mmol/L (ref 22–32)
Calcium: 8.9 mg/dL (ref 8.9–10.3)
Chloride: 98 mmol/L (ref 98–111)
Creatinine, Ser: 1.02 mg/dL — ABNORMAL HIGH (ref 0.44–1.00)
GFR, Estimated: 57 mL/min — ABNORMAL LOW (ref >60)
Glucose, Bld: 141 mg/dL — ABNORMAL HIGH (ref 70–99)
Potassium: 4.4 mmol/L (ref 3.5–5.1)
Sodium: 135 mmol/L (ref 135–145)

## 2023-03-10 MED ORDER — LOSARTAN POTASSIUM 50 MG PO TABS
50.0000 mg | ORAL_TABLET | Freq: Every day | ORAL | Status: DC
  Administered 2023-03-10 – 2023-03-12 (×3): 50 mg via ORAL
  Filled 2023-03-10 (×3): qty 1

## 2023-03-10 MED ORDER — FUROSEMIDE 40 MG PO TABS
40.0000 mg | ORAL_TABLET | Freq: Every day | ORAL | Status: DC
  Administered 2023-03-10 – 2023-03-15 (×6): 40 mg via ORAL
  Filled 2023-03-10 (×6): qty 1

## 2023-03-10 MED FILL — Mannitol IV Soln 20%: INTRAVENOUS | Qty: 500 | Status: AC

## 2023-03-10 MED FILL — Sodium Chloride IV Soln 0.9%: INTRAVENOUS | Qty: 2000 | Status: AC

## 2023-03-10 MED FILL — Electrolyte-R (PH 7.4) Solution: INTRAVENOUS | Qty: 5000 | Status: AC

## 2023-03-10 NOTE — Progress Notes (Signed)
Mobility Specialist Progress Note:   03/10/23 1533  Mobility  Activity Ambulated with assistance in hallway  Level of Assistance Standby assist, set-up cues, supervision of patient - no hands on  Assistive Device Four wheel walker  Distance Ambulated (ft) 400 ft  Activity Response Tolerated well  $Mobility charge 1 Mobility   Pt in bed willing to participate in mobility. No complaints of pain. Left EOB with call bell in reach and all needs met.   Joyce Potter Mobility Specialist Please contact via [facility] or  Rehab Office at [phone]

## 2023-03-10 NOTE — Progress Notes (Signed)
CARDIAC REHAB PHASE I   PRE:  Rate/Rhythm: 76 SR    BP: sitting 155/80    SpO2: 95 RA  MODE:  Ambulation: 430 ft   POST:  Rate/Rhythm: 98 SR    BP: sitting 145/82     SpO2: 98 RA  Pt on EOB. Stood independently with rocking and walked with rollator, standby assist. She is much less SOB today and HR more controlled. No rest. To recliner. Left materials for ed but pt would like her daughter here to discuss. Gave her d/c video to view when she is ready. [place] BS, ACSM-CEP 03/10/2023 8:23 AM

## 2023-03-10 NOTE — Progress Notes (Addendum)
[address]       [place] [address]             [phone]     8 Days Post-Op Procedure(s) (LRB): CORONARY ARTERY BYPASS GRAFTING (CABG) TIMES THREE USING THE LEFT INTERNAL MAMMARY ARTERY (LIMA) AND ENDOSCOPICALLY HARVESTED RIGHT GREATER SAPHENOUS VEIN (N/A) AORTIC VALVE REPLACEMENT (AVR) USING EDWARDS INSPIRIS RESILIA 23 MM AORTIC VALVE (N/A) TRANSESOPHAGEAL ECHOCARDIOGRAM (N/A) Subjective: Conts to feel better/ stronger  Objective: Vital signs in last 24 hours: Temp:  [97.8 F (36.6 C)-98.5 F (36.9 C)] 98.2 F (36.8 C) (03/14 0412) Pulse Rate:  [71-94] 74 (03/14 0412) Cardiac Rhythm: Normal sinus rhythm (03/13 1955) Resp:  [15-37] 16 (03/14 0412) BP: (119-169)/(53-145) 156/66 (03/14 0412) SpO2:  [82 %-99 %] 93 % (03/14 0412) Weight:  [91.3 kg] 91.3 kg (03/14 0500)  Hemodynamic parameters for last 24 hours:    Intake/Output from previous day: 03/13 0701 - 03/14 0700 In: 490 [P.O.:490] Out: -  Intake/Output this shift: No intake/output data recorded.  General appearance: alert, cooperative, and no distress Heart: regular rate and rhythm Lungs: min dim in bases Abdomen: benign Extremities: no edema Wound: incis healing well, thigh echymosis is stable without obvious hematoma  Lab Results: Recent Labs    03/08/23 0035 03/09/23 1800  WBC 6.6 7.4  HGB 8.5* 9.2*  HCT 25.3* 28.2*  PLT 178 281   BMET:  Recent Labs    03/09/23 1800 03/10/23 0057  NA 134* 135  K 4.7 4.4  CL 95* 98  CO2 28 29  GLUCOSE 175* 141*  BUN 24* 23  CREATININE 1.04* 1.02*  CALCIUM 8.9 8.9    PT/INR: No results for input(s): "LABPROT", "INR" in the last 72 hours. ABG    Component Value Date/Time   PHART 7.352 03/05/2023 0117   HCO3 22.5 03/05/2023 0117   TCO2 24 03/05/2023 0117   ACIDBASEDEF 3.0 (H) 03/05/2023 0117   O2SAT 74.8 03/07/2023 0334   CBG (last 3)  Recent Labs    03/09/23 1539 03/09/23 2119 03/10/23 0610  GLUCAP 234* 184* 107*     Meds Scheduled Meds:  amiodarone  200 mg Oral Daily   aspirin EC  325 mg Oral Daily   Or   aspirin  324 mg Per Tube Daily   [place] [facility], Patient & Family Education   Does not apply Once   docusate sodium  200 mg Oral Daily   furosemide  40 mg Intravenous Q12H   insulin aspart  0-15 Units Subcutaneous TID WC   insulin aspart  0-5 Units Subcutaneous QHS   insulin detemir  20 Units Subcutaneous Daily   lactose free nutrition  237 mL Oral TID WC   losartan  25 mg Oral Daily   metoprolol succinate  100 mg Oral Daily   multivitamin with minerals  1 tablet Oral Daily   pantoprazole  40 mg Oral Daily   polyethylene glycol  17 g Oral Daily   potassium chloride  10 mEq Oral BID   sertraline  25 mg Oral Daily   sodium chloride flush  3 mL Intravenous Q12H   Continuous Infusions:  sodium chloride     PRN Meds:.sodium chloride, hydrALAZINE, ipratropium-albuterol, ondansetron (ZOFRAN) IV, oxyCODONE, sodium chloride flush, traMADol  Xrays No results found.  Assessment/Plan: S/P Procedure(s) (LRB): CORONARY ARTERY BYPASS GRAFTING (CABG) TIMES THREE USING THE LEFT INTERNAL MAMMARY ARTERY (LIMA) AND ENDOSCOPICALLY HARVESTED RIGHT GREATER SAPHENOUS VEIN (N/A) AORTIC VALVE REPLACEMENT (AVR)  USING EDWARDS INSPIRIS RESILIA 23 MM AORTIC VALVE (N/A) TRANSESOPHAGEAL ECHOCARDIOGRAM (N/A) POD#8  1 afebrile, s BP variable 119-169 range- mostly elevated- sinus rhythm, will increase losartan 2 O2 sats ok on RA 3 voiding frequently, not measured, weight stable 4 CXR- small L>R effusions , some atx 5 BS one reading>200- home meds at d/c 6 normal renal fxn 6 working on placement- she is close to being stable for transfer to SNF, family also trying to decide if home is an option     LOS: 8 days    [name] PA-C Pager [phone] 03/10/2023  Agree with above

## 2023-03-10 NOTE — TOC Progression Note (Signed)
Transition of Care [facility]) - Progression Note    Patient Details  Name: Joyce Potter MRN: [id number] Date of Birth: 01-Nov-1946  Transition of Care [facility]) CM/SW [place], [place] Phone Number: 03/10/2023, 3:20 PM  Clinical Narrative:     Spoke with patient' daughter, informed of a few other bed offers. She states she will review and call CSW back.    Expected Discharge Plan: [place] Barriers to Discharge: Continued Medical Work up  Expected Discharge Plan and Services In-house Referral: Clinical Social Work Discharge Planning Services: CM Consult   Living arrangements for the past 2 months: Single Family Home                                       Social Determinants of Health (SDOH) Interventions SDOH Screenings   Food Insecurity: No Food Insecurity (02/18/2022)  Housing: Low Risk  (12/16/2021)  Transportation Needs: No Transportation Needs (05/31/2022)  Alcohol Screen: Low Risk  (12/16/2021)  Depression (PHQ2-9): Low Risk  (05/31/2022)  Financial Resource Strain: Low Risk  (05/31/2022)  Social Connections: Socially Integrated (12/16/2021)  Stress: No Stress Concern Present (02/18/2022)  Tobacco Use: Medium Risk (03/03/2023)    Readmission Risk Interventions     No data to display

## 2023-03-10 NOTE — Progress Notes (Signed)
Pt is alert, fully oriented x 4, pleasant, stable hemodynamically, afebrile, NSR on the monitor. On room air SPO2 95% at night. Pt is able to ambulate with standby assisted and walker in room and hallway, well tolerated. Denies SOB, denies pain.   Mid sternal incision is open to air, dry and clean, no drainage, painted with Betadine. Right leg incision is negative for drainage, but large ecchymosis on inner thigh, negative new hematoma and Pt denies pain. No acute distress noted overnight. We will continue to monitor.   Kennyth Lose, RN

## 2023-03-10 NOTE — TOC Progression Note (Signed)
Transition of Care [facility]) - Progression Note    Patient Details  Name: Joyce Potter MRN: [id number] Date of Birth: 1946/03/21  Transition of Care [facility]) CM/SW [place], [place] Phone Number: 03/10/2023, 1:44 PM  Clinical Narrative:     CSW received message from PA- patient would like to extend there search for more SNF options, preferably in the [place] area.   CSW spoke with the patient by phone- sending out more referrals however, [facility] has limited SNFs in network .   TOC will provide bed offers once available   Thurmond Butts, MSW, LCSW Clinical Social Worker      Expected Discharge Plan: Skilled Nursing Facility Barriers to Discharge: Continued Medical Work up  Expected Discharge Plan and Services In-house Referral: Clinical Social Work Discharge Planning Services: CM Consult   Living arrangements for the past 2 months: Single Family Home                                       Social Determinants of Health (SDOH) Interventions SDOH Screenings   Food Insecurity: No Food Insecurity (02/18/2022)  Housing: Low Risk  (12/16/2021)  Transportation Needs: No Transportation Needs (05/31/2022)  Alcohol Screen: Low Risk  (12/16/2021)  Depression (PHQ2-9): Low Risk  (05/31/2022)  Financial Resource Strain: Low Risk  (05/31/2022)  Social Connections: Socially Integrated (12/16/2021)  Stress: No Stress Concern Present (02/18/2022)  Tobacco Use: Medium Risk (03/03/2023)    Readmission Risk Interventions     No data to display

## 2023-03-10 NOTE — Progress Notes (Signed)
CARDIAC REHAB PHASE I     Returned this afternoon per pt request to provide Post OHS education with pt  and daughter. Pt states, my daughter is working on nursing home placement for my husband, not sure when she will be here today. Will provide education at a later time. Will continue to follow.   [id number]   Vanessa Barbara, RN BSN 03/10/2023 2:30 PM

## 2023-03-10 NOTE — Progress Notes (Signed)
Physical Therapy Treatment Patient Details Name: Joyce Potter MRN: [id number] DOB: 07/09/1946 Today's Date: 03/10/2023   History of Present Illness Pt is 77 yo female admitted 03/02/23 for CABG x 2 and AVR. PMH - CAD, severe aortic stenosis, HTN, DM, lt eye central blindness, GIB    PT Comments    Session focused on continued education re: sternal precautions and adherence during functional activities, balance, gait, and stair training. Patient reports she tends to get SOB with ambulation and required cues to slow her velocity to help control this symptom. She also demonstrated better control of RW with slower speed (when too fast, she drifts to her right). Discharge plan remains appropriate as she does not have assist at home.     Recommendations for follow up therapy are one component of a multi-disciplinary discharge planning process, led by the attending physician.  Recommendations may be updated based on patient status, additional functional criteria and insurance authorization.  Follow Up Recommendations  Skilled nursing-short term rehab (<3 hours/day) Can patient physically be transported by private vehicle: Yes   Assistance Recommended at Discharge Frequent or constant Supervision/Assistance  Patient can return home with the following A little help with walking and/or transfers;A little help with bathing/dressing/bathroom;Assistance with cooking/housework;Assist for transportation;Help with stairs or ramp for entrance   Equipment Recommendations  None recommended by PT    Recommendations for Other Services       Precautions / Restrictions Precautions Precautions: Sternal;Fall Precaution Booklet Issued: Yes (comment) Precaution Comments: pt continues to require cues to adhere to sternal precautions Restrictions Weight Bearing Restrictions: Yes RUE Weight Bearing: Partial weight bearing (sternal precaution) LUE Weight Bearing: Partial weight bearing (sternal precaution)      Mobility  Bed Mobility Overal bed mobility: Needs Assistance Bed Mobility: Rolling, Sidelying to Sit, Sit to Sidelying Rolling: Min assist Sidelying to sit: Min assist, HOB elevated     Sit to sidelying: Min assist General bed mobility comments: pt reaching for rails to pull to roll and reminded of sternal precautions; required min assist at shoulder to roll and to raise torso up to sitting; return to bed required assist to raise legs into bed    Transfers Overall transfer level: Needs assistance Equipment used: Rolling walker (2 wheels) Transfers: Sit to/from Stand Sit to Stand: Min assist           General transfer comment: cues for hand placement and safety; use of momentum with unsuccessful attempt x1 and min assist to come to stand (from EOB)    Ambulation/Gait Ambulation/Gait assistance: Min guard Gait Distance (Feet): 250 Feet Assistive device: Rolling walker (2 wheels) Gait Pattern/deviations: Step-through pattern, Decreased stride length, Drifts right/left Gait velocity: too fast initially for her to avoid dyspnea; better control of RW when speed decreased     General Gait Details: pt impulsive with quick speed needing cues to slow gait to avoid SOB, control RW and step into RW.   Stairs Stairs: Yes Stairs assistance: Min guard Stair Management: One rail Left, Step to pattern, Sideways Number of Stairs: 4 General stair comments: pt reports 4 stairs to enter at back; 8 steps to enter front and normally goes in through the back   Wheelchair Mobility    Modified Rankin (Stroke Patients Only)       Balance Overall balance assessment: Needs assistance Sitting-balance support: No upper extremity supported, Feet supported Sitting balance-Leahy Scale: Fair     Standing balance support: Bilateral upper extremity supported, Reliant on assistive device for balance Standing  balance-Leahy Scale: Poor Standing balance comment: RW for standing and gait                             Cognition Arousal/Alertness: Awake/alert Behavior During Therapy: WFL for tasks assessed/performed Overall Cognitive Status: Impaired/Different from baseline Area of Impairment: Memory                     Memory: Decreased recall of precautions                  Exercises      General Comments General comments (skin integrity, edema, etc.): Patient reporting bottom tired from sitting in chair earlier this morning and requested return to bed.      Pertinent Vitals/Pain Pain Assessment Pain Assessment: Faces Faces Pain Scale: Hurts a little bit Pain Location: chest Pain Descriptors / Indicators: Guarding, Sore Pain Intervention(s): Limited activity within patient's tolerance, Monitored during session (encouraged use of pillow for splinting with coughing)    Home Living                          Prior Function            PT Goals (current goals can now be found in the care plan section) Acute Rehab PT Goals Patient Stated Goal: to return home Time For Goal Achievement: 03/20/23 Potential to Achieve Goals: Good Progress towards PT goals: Progressing toward goals    Frequency    Min 3X/week      PT Plan Current plan remains appropriate    Co-evaluation              AM-PAC PT "6 Clicks" Mobility   Outcome Measure  Help needed turning from your back to your side while in a flat bed without using bedrails?: A Little Help needed moving from lying on your back to sitting on the side of a flat bed without using bedrails?: A Little Help needed moving to and from a bed to a chair (including a wheelchair)?: A Little Help needed standing up from a chair using your arms (e.g., wheelchair or bedside chair)?: A Little Help needed to walk in hospital room?: A Little Help needed climbing 3-5 steps with a railing? : A Little 6 Click Score: 18    End of Session Equipment Utilized During Treatment: Gait  belt Activity Tolerance: Patient tolerated treatment well Patient left: in bed;with call bell/phone within reach   PT Visit Diagnosis: Unsteadiness on feet (R26.81);Other abnormalities of gait and mobility (R26.89);Muscle weakness (generalized) (M62.81)     Time: [id number] PT Time Calculation (min) (ACUTE ONLY): 24 min  Charges:  $Gait Training: 23-37 mins                      [place]  Office [phone]    Rexanne Mano 03/10/2023, 12:01 PM

## 2023-03-10 NOTE — Care Management Important Message (Signed)
Important Message  Patient Details  Name: Joyce Potter MRN: [id number] Date of Birth: 1946-12-06   Medicare Important Message Given:  Yes     Shelda Altes 03/10/2023, 8:44 AM

## 2023-03-11 ENCOUNTER — Other Ambulatory Visit: Payer: Self-pay | Admitting: Thoracic Surgery (Cardiothoracic Vascular Surgery)

## 2023-03-11 DIAGNOSIS — Z951 Presence of aortocoronary bypass graft: Secondary | ICD-10-CM

## 2023-03-11 LAB — GLUCOSE, CAPILLARY
Glucose-Capillary: 142 mg/dL — ABNORMAL HIGH (ref 70–99)
Glucose-Capillary: 161 mg/dL — ABNORMAL HIGH (ref 70–99)
Glucose-Capillary: 217 mg/dL — ABNORMAL HIGH (ref 70–99)
Glucose-Capillary: 220 mg/dL — ABNORMAL HIGH (ref 70–99)

## 2023-03-11 NOTE — Progress Notes (Signed)
[address]       ,[place] [address]             [phone]     9 Days Post-Op Procedure(s) (LRB): CORONARY ARTERY BYPASS GRAFTING (CABG) TIMES THREE USING THE LEFT INTERNAL MAMMARY ARTERY (LIMA) AND ENDOSCOPICALLY HARVESTED RIGHT GREATER SAPHENOUS VEIN (N/A) AORTIC VALVE REPLACEMENT (AVR) USING EDWARDS INSPIRIS RESILIA 23 MM AORTIC VALVE (N/A) TRANSESOPHAGEAL ECHOCARDIOGRAM (N/A) Subjective: Feels ok , some episodes when she feels SOB  Objective: Vital signs in last 24 hours: Temp:  [97.7 F (36.5 C)-99 F (37.2 C)] 98.1 F (36.7 C) (03/15 0557) Pulse Rate:  [70-84] 75 (03/15 0557) Cardiac Rhythm: Normal sinus rhythm (03/14 2355) Resp:  [19-20] 20 (03/15 0557) BP: (122-155)/(51-80) 155/61 (03/15 0557) SpO2:  [93 %-95 %] 93 % (03/15 0557) Weight:  [88.4 kg] 88.4 kg (03/15 0557)  Hemodynamic parameters for last 24 hours:    Intake/Output from previous day: No intake/output data recorded. Intake/Output this shift: No intake/output data recorded.  General appearance: alert, cooperative, and no distress Heart: regular rate and rhythm Lungs: dim in lower fields Abdomen: benign Extremities: no edema, echymosis right thigh is stable Wound: incis healing well  Lab Results: Recent Labs    03/09/23 1800  WBC 7.4  HGB 9.2*  HCT 28.2*  PLT 281   BMET:  Recent Labs    03/09/23 1800 03/10/23 0057  NA 134* 135  K 4.7 4.4  CL 95* 98  CO2 28 29  GLUCOSE 175* 141*  BUN 24* 23  CREATININE 1.04* 1.02*  CALCIUM 8.9 8.9    PT/INR: No results for input(s): "LABPROT", "INR" in the last 72 hours. ABG    Component Value Date/Time   PHART 7.352 03/05/2023 0117   HCO3 22.5 03/05/2023 0117   TCO2 24 03/05/2023 0117   ACIDBASEDEF 3.0 (H) 03/05/2023 0117   O2SAT 74.8 03/07/2023 0334   CBG (last 3)  Recent Labs    03/10/23 1631 03/10/23 2110 03/11/23 0603  GLUCAP 206* 149* 142*    Meds Scheduled Meds:  amiodarone  200 mg Oral Daily    aspirin EC  325 mg Oral Daily   Or   aspirin  324 mg Per Tube Daily   [facility] [facility], Patient & Family Education   Does not apply Once   docusate sodium  200 mg Oral Daily   furosemide  40 mg Oral Daily   insulin aspart  0-15 Units Subcutaneous TID WC   insulin aspart  0-5 Units Subcutaneous QHS   insulin detemir  20 Units Subcutaneous Daily   lactose free nutrition  237 mL Oral TID WC   losartan  50 mg Oral Daily   metoprolol succinate  100 mg Oral Daily   multivitamin with minerals  1 tablet Oral Daily   pantoprazole  40 mg Oral Daily   polyethylene glycol  17 g Oral Daily   potassium chloride  10 mEq Oral BID   sertraline  25 mg Oral Daily   sodium chloride flush  3 mL Intravenous Q12H   Continuous Infusions:  sodium chloride     PRN Meds:.sodium chloride, hydrALAZINE, ipratropium-albuterol, ondansetron (ZOFRAN) IV, oxyCODONE, sodium chloride flush, traMADol  Xrays DG Chest 2 View  Result Date: 03/10/2023 CLINICAL DATA:  Status post CABG x2.  Surgery follow-up. EXAM: CHEST - 2 VIEW COMPARISON:  Chest radiographs 03/08/2023, 03/06/2023, 02/25/2023 FINDINGS: Status post median sternotomy and aortic valve replacement. Cardiac silhouette is again mildly enlarged.  Mediastinal contours are within normal limits. Small left pleural effusion tracking up the lateral left pleura. Likely small right pleural effusion with fluid tracking along the right minor fissure. Mild bilateral interstitial thickening. No pneumothorax. Moderate multilevel degenerative disc changes of the thoracic spine. IMPRESSION: 1. Small bilateral pleural effusions, left greater than right. These are similar to prior. 2. Mild bilateral interstitial thickening suggesting mild interstitial pulmonary edema. Electronically Signed   By: Yvonne Kendall M.D.   On: 03/10/2023 08:31    Assessment/Plan: S/P Procedure(s) (LRB): CORONARY ARTERY BYPASS GRAFTING (CABG) TIMES THREE USING THE LEFT INTERNAL MAMMARY ARTERY  (LIMA) AND ENDOSCOPICALLY HARVESTED RIGHT GREATER SAPHENOUS VEIN (N/A) AORTIC VALVE REPLACEMENT (AVR) USING EDWARDS INSPIRIS RESILIA 23 MM AORTIC VALVE (N/A) TRANSESOPHAGEAL ECHOCARDIOGRAM (N/A) POD#9  1 Tmax 99, s BP a little better but elevated at times- 122-155 range, losartan increased yesterday, follow at current dose for now  2 O2 sats good on RA 3 BS some readings >200 - will resume 70/30 at d/c cont SSI/levemir for now, dulaglutide can be resumed at some point but if it limits SNF availability can be held at least short term 4 weight trending down further, don't have an accurate preop weight, can likely d/c lasix soon, does have small pleural effusions 5 SW widening bed search, could potentially go home if search is prolonged. Talked with patients daughter yesterday and it's a difficult home situation trying to help with her husband who has significant dementia and her.    LOS: 9 days    [name] PA-C Pager [phone] 03/11/2023

## 2023-03-11 NOTE — TOC Progression Note (Signed)
Transition of Care [facility]) - Progression Note    Patient Details  Name: ONETHA RYHERD MRN: [id number] Date of Birth: 1946/03/01  Transition of Care [facility]) CM/SW [place], [place] Phone Number: 03/11/2023, 2:15 PM  Clinical Narrative:     Received call from patient's daughter- she has accepted bed offer w/ [facility].  [facility] confirmed bed offer and will start auth w/ [employer].   TOC will continue to follow and assist with discharge planning.  Thurmond Butts, MSW, LCSW Clinical Social Worker    Expected Discharge Plan: Skilled Nursing Facility Barriers to Discharge: Continued Medical Work up  Expected Discharge Plan and Services In-house Referral: Clinical Social Work Discharge Planning Services: CM Consult   Living arrangements for the past 2 months: Single Family Home                                       Social Determinants of Health (SDOH) Interventions SDOH Screenings   Food Insecurity: No Food Insecurity (02/18/2022)  Housing: Low Risk  (12/16/2021)  Transportation Needs: No Transportation Needs (05/31/2022)  Alcohol Screen: Low Risk  (12/16/2021)  Depression (PHQ2-9): Low Risk  (05/31/2022)  Financial Resource Strain: Low Risk  (05/31/2022)  Social Connections: Socially Integrated (12/16/2021)  Stress: No Stress Concern Present (02/18/2022)  Tobacco Use: Medium Risk (03/03/2023)    Readmission Risk Interventions     No data to display

## 2023-03-11 NOTE — TOC Progression Note (Signed)
Transition of Care [facility]) - Progression Note    Patient Details  Name: Joyce Potter MRN: [id number] Date of Birth: June 19, 1946  [facility]) CM/SW [place],  Phone Number: 03/11/2023, 2:51 PM  Clinical Narrative:     [facility] advised they have SNF authorization - they can admit patient tomorrow 3/16.  TOC will continue to follow and assist with discharge planning.  Thurmond Butts, MSW, LCSW Clinical Social Worker     Expected Discharge Plan: Skilled Nursing Facility Barriers to Discharge: Continued Medical Work up  Expected Discharge Plan and Services In-house Referral: Clinical Social Work Discharge Planning Services: CM Consult   Living arrangements for the past 2 months: Single Family Home                                       Social Determinants of Health (SDOH) Interventions SDOH Screenings   Food Insecurity: No Food Insecurity (02/18/2022)  Housing: Low Risk  (12/16/2021)  Transportation Needs: No Transportation Needs (05/31/2022)  Alcohol Screen: Low Risk  (12/16/2021)  Depression (PHQ2-9): Low Risk  (05/31/2022)  Financial Resource Strain: Low Risk  (05/31/2022)  Social Connections: Socially Integrated (12/16/2021)  Stress: No Stress Concern Present (02/18/2022)  Tobacco Use: Medium Risk (03/03/2023)    Readmission Risk Interventions     No data to display

## 2023-03-11 NOTE — Progress Notes (Signed)
Nutrition Follow-up  DOCUMENTATION CODES:    (unable to assess)  INTERVENTION:   Chopped meats with meals Meal ordering with assist Continue Boost Plus po TID, each supplement provides 360 kcal and 14 grams of protein Encourage good PO intake   NUTRITION DIAGNOSIS:   Inadequate oral intake related to nausea, poor appetite, acute illness as evidenced by meal completion < 25%, per patient/family report. - Progressing   GOAL:   Patient will meet greater than or equal to 90% of their needs - Ongoing   MONITOR:   PO intake, Supplement acceptance, Labs, Weight trends  REASON FOR ASSESSMENT:   Consult Poor PO  ASSESSMENT:   77 yo female with CAD and severe aortic stenosis and admitted for CABG x 2, AVR. PMH includes CAD, severe aortic stenosis, carotid stenosis, HLD, HTN, DM, OSA, PUD, anemia  3/06 CABG x 2, significant post op bleeding, eventually stopped 3/07 Extubated, CL diet 3/08 Diet advanced to Carb Mod 3/09 - diet liberalized to regular  Pt laying in bed at time of RD visit. Reports that her appetite has improved since beginning of admission. Reports that she ate everything for lunch which was a sandwich, chips, pudding, and a drink. She is having difficult with some foods due to her dentures not fitting well, pt agreeable to RD ordering meats already chopped. Reports that she was drinking the Boost Plus but then today has not drank any because she is worried about her CBGs being so high. RD explained that with her PO intake being minimal would recommend continuing with the Boost Plus instead of Glucerna due to needing additional calories and protein. Pt agreeable.  Meal Intake 3/09-3/15: 0-80% x 7 meals (average 47%)  Medications reviewed and include: Colace, Lasix, NovoLog SSI, Levemir, MVI, Protonix, Miralax, Potassium Chloride Labs reviewed: 24 hr CBGs 142-206   Diet Order:   Diet Order             Diet regular Room service appropriate? Yes with Assist; Fluid  consistency: Thin  Diet effective now                   EDUCATION NEEDS:   Not appropriate for education at this time  Skin:  Skin Assessment: Skin Integrity Issues:  Last BM:  3/14  Height:  Ht Readings from Last 1 Encounters:  03/07/23 5\' 3"  (1.6 m)   Weight:  Wt Readings from Last 1 Encounters:  03/11/23 88.4 kg    BMI:  Body mass index is 34.52 kg/m.  Estimated Nutritional Needs:  Kcal:  1600-1800 kcals Protein:  75-85 g Fluid:  1.6-1.8 L   Hermina Barters RD, LDN Clinical Dietitian See [facility] for contact information.

## 2023-03-11 NOTE — Progress Notes (Signed)
CARDIAC REHAB PHASE I   PRE:  Rate/Rhythm: 78 SR    BP: sitting 149/60    SpO2: 96 RA  MODE:  Ambulation: 430 ft   POST:  Rate/Rhythm: 96 SR    BP: sitting 165/74     SpO2: 96 RA  Pt stood independently however did need reminder for sternal precautions getting to EOB. Walked with rollator, no assist. Rest x1 due to SOB. To EOB. VSS  Discussed IS, sternal precautions, diet, exercise, and CRPII. Pt receptive. Will refer to [place]. Encouraged more walking. She is complaining of swelling, burning and pain from right thigh graft site. Some soreness with walking.  [place], ACSM-CEP 03/11/2023 2:36 PM

## 2023-03-11 NOTE — Progress Notes (Signed)
Mobility Specialist Progress Note:   03/11/23 1410  Mobility  Activity Refused mobility   Pt with cardiac rehab. Will follow-up as time allows.   Gareth Eagle Jahzir Strohmeier Mobility Specialist Please contact via [employer] or  Rehab Office at [phone]

## 2023-03-12 LAB — GLUCOSE, CAPILLARY
Glucose-Capillary: 149 mg/dL — ABNORMAL HIGH (ref 70–99)
Glucose-Capillary: 216 mg/dL — ABNORMAL HIGH (ref 70–99)
Glucose-Capillary: 135 mg/dL — ABNORMAL HIGH (ref 70–99)
Glucose-Capillary: 165 mg/dL — ABNORMAL HIGH (ref 70–99)

## 2023-03-12 MED ORDER — UMECLIDINIUM BROMIDE 62.5 MCG/ACT IN AEPB
1.0000 | INHALATION_SPRAY | Freq: Every day | RESPIRATORY_TRACT | Status: DC
  Administered 2023-03-12 – 2023-03-15 (×3): 1 via RESPIRATORY_TRACT
  Filled 2023-03-12: qty 7

## 2023-03-12 MED ORDER — INSULIN DETEMIR 100 UNIT/ML ~~LOC~~ SOLN
24.0000 [IU] | Freq: Every day | SUBCUTANEOUS | Status: DC
  Administered 2023-03-12 – 2023-03-14 (×3): 24 [IU] via SUBCUTANEOUS
  Filled 2023-03-12 (×3): qty 0.24

## 2023-03-12 NOTE — Progress Notes (Signed)
CARDIAC REHAB PHASE I   PRE:  Rate/Rhythm: 76 NSR  BP:  Sitting: 160/57      SaO2: 96 RA  MODE:  Ambulation: 470 ft   AD:  Rollator  POST:  Rate/Rhythm: 104 ST  BP:  Sitting:        206/65 After 2 min 194/68 After 5 min  177/70     SaO2: 96 RA  Pt amb with standby assistance, pt denies CP and SOB during amb and was returned to room w/o complaint.   Pt walked with steady brisk gait.   Christen Bame  10:26 AM 03/12/2023    Service time is from 0955 to 1032.

## 2023-03-12 NOTE — Progress Notes (Addendum)
      [address]       [place] [address]             [phone]        10 Days Post-Op Procedure(s) (LRB): CORONARY ARTERY BYPASS GRAFTING (CABG) TIMES THREE USING THE LEFT INTERNAL MAMMARY ARTERY (LIMA) AND ENDOSCOPICALLY HARVESTED RIGHT GREATER SAPHENOUS VEIN (N/A) AORTIC VALVE REPLACEMENT (AVR) USING EDWARDS INSPIRIS RESILIA 23 MM AORTIC VALVE (N/A) TRANSESOPHAGEAL ECHOCARDIOGRAM (N/A)  Subjective: Patient states right thigh hurts (ecchymosis, hematoma from [facility])  Objective: Vital signs in last 24 hours: Temp:  [97.4 F (36.3 C)-98.9 F (37.2 C)] 97.9 F (36.6 C) (03/16 0501) Pulse Rate:  [69-106] 75 (03/16 0501) Cardiac Rhythm: Normal sinus rhythm (03/15 2000) Resp:  [15-20] 20 (03/16 0501) BP: (81-158)/(54-72) 150/58 (03/16 0501) SpO2:  [92 %-98 %] 95 % (03/16 0501) Weight:  [89.5 kg] 89.5 kg (03/16 0640)  Pre op weight 87.5 kg Current Weight  03/12/23 89.5 kg     Intake/Output from previous day: 03/15 0701 - 03/16 0700 In: 600 [P.O.:600] Out: 1 [Urine:1]   Physical Exam:  Cardiovascular: RRR Pulmonary: Slightly diminished bibasilar breath sounds Abdomen: Soft, non tender, bowel sounds present. Extremities: Mild bilateral lower extremity edema. Ecchymosis, small hematoma right thigh Wounds: Clean and dry.  No erythema or signs of infection.  Lab Results: CBC: Recent Labs    03/09/23 1800  WBC 7.4  HGB 9.2*  HCT 28.2*  PLT 281   BMET:  Recent Labs    03/09/23 1800 03/10/23 0057  NA 134* 135  K 4.7 4.4  CL 95* 98  CO2 28 29  GLUCOSE 175* 141*  BUN 24* 23  CREATININE 1.04* 1.02*  CALCIUM 8.9 8.9    PT/INR:  Lab Results  Component Value Date   INR 1.5 (H) 03/02/2023   INR 1.6 (H) 03/02/2023   INR 1.0 02/25/2023   ABG:  INR: Will add last result for INR, ABG once components are confirmed Will add last 4 CBG results once components are confirmed  Assessment/Plan:  1. CV - SR. On Amiodarone 200 mg daily, Toprol  XL 100 mg daily, Losartan 50 mg daily 2.  Pulmonary - On room air. Encourage incentive spirometer. 3. Volume Overload - On Lasix 40 mg daily. 4.  Expected post op acute blood loss anemia - Last H and H stable at 9.2 and 28.2 5. DM-CBGs 217/220/149. On Insulin but will increase for better glucose control. Pre op HGA1C  6. Per patient, placement will either be SNF (has chosen facility  Donielle M ZimmermanPA-C 8:09 AM  Agree with above

## 2023-03-13 LAB — GLUCOSE, CAPILLARY
Glucose-Capillary: 129 mg/dL — ABNORMAL HIGH (ref 70–99)
Glucose-Capillary: 199 mg/dL — ABNORMAL HIGH (ref 70–99)
Glucose-Capillary: 237 mg/dL — ABNORMAL HIGH (ref 70–99)
Glucose-Capillary: 246 mg/dL — ABNORMAL HIGH (ref 70–99)

## 2023-03-13 MED ORDER — DOXYCYCLINE HYCLATE 100 MG PO TABS
100.0000 mg | ORAL_TABLET | Freq: Two times a day (BID) | ORAL | Status: DC
  Administered 2023-03-13 – 2023-03-15 (×5): 100 mg via ORAL
  Filled 2023-03-13 (×5): qty 1

## 2023-03-13 MED ORDER — LOSARTAN POTASSIUM 50 MG PO TABS
100.0000 mg | ORAL_TABLET | Freq: Every day | ORAL | Status: DC
  Administered 2023-03-13 – 2023-03-15 (×3): 100 mg via ORAL
  Filled 2023-03-13 (×3): qty 2

## 2023-03-13 NOTE — Progress Notes (Addendum)
      [address]       [place] [address]             [phone]        11 Days Post-Op Procedure(s) (LRB): CORONARY ARTERY BYPASS GRAFTING (CABG) TIMES THREE USING THE LEFT INTERNAL MAMMARY ARTERY (LIMA) AND ENDOSCOPICALLY HARVESTED RIGHT GREATER SAPHENOUS VEIN (N/A) AORTIC VALVE REPLACEMENT (AVR) USING EDWARDS INSPIRIS RESILIA 23 MM AORTIC VALVE (N/A) TRANSESOPHAGEAL ECHOCARDIOGRAM (N/A)  Subjective: Patient states right thigh hurts (ecchymosis, hematoma from [facility]). This does not prevent her from walking  Objective: Vital signs in last 24 hours: Temp:  [97.8 F (36.6 C)-98.9 F (37.2 C)] 98 F (36.7 C) (03/17 0430) Pulse Rate:  [66-82] 67 (03/17 0430) Cardiac Rhythm: Normal sinus rhythm (03/16 1909) Resp:  [17-20] 20 (03/17 0430) BP: (131-160)/(56-66) 131/56 (03/17 0430) SpO2:  [92 %-97 %] 93 % (03/17 0430) Weight:  [88 kg] 88 kg (03/17 0607)  Pre op weight 87.5 kg Current Weight  03/13/23 88 kg     Intake/Output from previous day: 03/16 0701 - 03/17 0700 In: 1140 [P.O.:1140] Out: -    Physical Exam:  Cardiovascular: RRR Pulmonary: Slightly diminished bibasilar breath sounds Abdomen: Soft, non tender, bowel sounds present. Extremities: Mild bilateral lower extremity edema. Redness, ecchymosis, and small hematoma right thigh Wounds: Clean and dry.  No erythema or signs of infection.  Lab Results: CBC: No results for input(s): "WBC", "HGB", "HCT", "PLT" in the last 72 hours.  BMET:  No results for input(s): "NA", "K", "CL", "CO2", "GLUCOSE", "BUN", "CREATININE", "CALCIUM" in the last 72 hours.   PT/INR:  Lab Results  Component Value Date   INR 1.5 (H) 03/02/2023   INR 1.6 (H) 03/02/2023   INR 1.0 02/25/2023   ABG:  INR: Will add last result for INR, ABG once components are confirmed Will add last 4 CBG results once components are confirmed  Assessment/Plan:  1. CV - SR, hypertensive. On Amiodarone 200 mg daily, Toprol XL 100  mg daily, Losartan 50 mg daily. Will increase Losartan for better BP control 2.  Pulmonary - On room air. Encourage incentive spirometer. 3. Volume Overload - On Lasix 40 mg daily. 4.  Expected post op acute blood loss anemia - Last H and H stable at 9.2 and 28.2 5. DM-CBGs 217/220/149. On Insulin but will increase for better glucose control. Pre op HGA1C  6. Hematoma, warmth right thigh (from [facility])-will start Doxycycline (PCN causes rash) 7. Per patient, placement will likely to be SNF (has chosen facility)  Sharalyn Ink ZimmermanPA-C 7:59 AM  Agree with above

## 2023-03-13 NOTE — Progress Notes (Signed)
Patient has declined ambulation today due to incision on right leg being swollen and rubbing against other leg while ambulating.  PA is aware of swollen incision and placed new orders this morning.

## 2023-03-13 NOTE — Progress Notes (Signed)
Mobility Specialist Progress Note:   03/13/23 1145  Mobility  Activity Stood at bedside (+ march in place (limited))  Level of Assistance Modified independent, requires aide device or extra time  Assistive Device None  Activity Response Tolerated fair  Mobility Referral Yes  $Mobility charge 1 Mobility   Pt politely declining ambulation d/t thigh incision pain/possible oozing. Agreeable to stand up ad marching in place, with increased pain on incision. Pt declining further mobility. Back in chair with all needs met.   Nelta Numbers Mobility Specialist Please contact via SecureChat or  Rehab office at [phone]

## 2023-03-14 ENCOUNTER — Ambulatory Visit: Payer: Self-pay

## 2023-03-14 ENCOUNTER — Other Ambulatory Visit: Payer: Self-pay

## 2023-03-14 LAB — GLUCOSE, CAPILLARY
Glucose-Capillary: 136 mg/dL — ABNORMAL HIGH (ref 70–99)
Glucose-Capillary: 151 mg/dL — ABNORMAL HIGH (ref 70–99)
Glucose-Capillary: 277 mg/dL — ABNORMAL HIGH (ref 70–99)
Glucose-Capillary: 201 mg/dL — ABNORMAL HIGH (ref 70–99)

## 2023-03-14 MED ORDER — INSULIN DETEMIR 100 UNIT/ML ~~LOC~~ SOLN
28.0000 [IU] | Freq: Every day | SUBCUTANEOUS | Status: DC
  Administered 2023-03-15: 28 [IU] via SUBCUTANEOUS
  Filled 2023-03-14: qty 0.28

## 2023-03-14 NOTE — Progress Notes (Signed)
Pt sleeping, amb deferred.

## 2023-03-14 NOTE — Progress Notes (Signed)
Mobility Specialist Progress Note:   03/14/23 0930  Mobility  Activity Ambulated with assistance in hallway  Level of Assistance Standby assist, set-up cues, supervision of patient - no hands on  Assistive Device Four wheel walker  Distance Ambulated (ft) 450 ft  Activity Response Tolerated well  $Mobility charge 1 Mobility   Pt in chair willing to participate in mobility. No complaints of pain. Left in chair with call bell in reach and all needs met.   Gareth Eagle Shantel Helwig Mobility Specialist Please contact via [employer] or  Rehab Office at [phone]

## 2023-03-14 NOTE — Progress Notes (Addendum)
[address]       [place] [address]             [phone]     12 Days Post-Op Procedure(s) (LRB): CORONARY ARTERY BYPASS GRAFTING (CABG) TIMES THREE USING THE LEFT INTERNAL MAMMARY ARTERY (LIMA) AND ENDOSCOPICALLY HARVESTED RIGHT GREATER SAPHENOUS VEIN (N/A) AORTIC VALVE REPLACEMENT (AVR) USING EDWARDS INSPIRIS RESILIA 23 MM AORTIC VALVE (N/A) TRANSESOPHAGEAL ECHOCARDIOGRAM (N/A) Subjective: Primary issue is discomfort with walking  Objective: Vital signs in last 24 hours: Temp:  [98.1 F (36.7 C)-98.6 F (37 C)] 98.1 F (36.7 C) (03/18 0743) Pulse Rate:  [62-72] 70 (03/18 0743) Cardiac Rhythm: Normal sinus rhythm (03/17 1900) Resp:  [16-20] 16 (03/18 0743) BP: (78-155)/(47-66) 116/48 (03/18 0743) SpO2:  [93 %-98 %] 98 % (03/18 0743) Weight:  [87.9 kg] 87.9 kg (03/18 0325)  Hemodynamic parameters for last 24 hours:    Intake/Output from previous day: 03/17 0701 - 03/18 0700 In: 120 [P.O.:120] Out: -  Intake/Output this shift: No intake/output data recorded.  General appearance: alert, cooperative, and no distress Heart: regular rate and rhythm and occas extrasystole Lungs: dim in bases L>R Abdomen: benign Extremities: no edema Wound: echymosis is improved, may have some blood in tunnel, no increase in warmth or erethema  Lab Results: No results for input(s): "WBC", "HGB", "HCT", "PLT" in the last 72 hours. BMET: No results for input(s): "NA", "K", "CL", "CO2", "GLUCOSE", "BUN", "CREATININE", "CALCIUM" in the last 72 hours.  PT/INR: No results for input(s): "LABPROT", "INR" in the last 72 hours. ABG    Component Value Date/Time   PHART 7.352 03/05/2023 0117   HCO3 22.5 03/05/2023 0117   TCO2 24 03/05/2023 0117   ACIDBASEDEF 3.0 (H) 03/05/2023 0117   O2SAT 74.8 03/07/2023 0334   CBG (last 3)  Recent Labs    03/13/23 1647 03/13/23 1941 03/14/23 0557  GLUCAP 237* 246* 136*    Meds Scheduled Meds:  amiodarone  200 mg Oral Daily    aspirin EC  325 mg Oral Daily   Or   aspirin  324 mg Per Tube Daily   [facility], Patient & Family Education   Does not apply Once   docusate sodium  200 mg Oral Daily   doxycycline  100 mg Oral Q12H   furosemide  40 mg Oral Daily   insulin aspart  0-15 Units Subcutaneous TID WC   insulin aspart  0-5 Units Subcutaneous QHS   insulin detemir  24 Units Subcutaneous Daily   lactose free nutrition  237 mL Oral TID WC   losartan  100 mg Oral Daily   metoprolol succinate  100 mg Oral Daily   multivitamin with minerals  1 tablet Oral Daily   pantoprazole  40 mg Oral Daily   polyethylene glycol  17 g Oral Daily   potassium chloride  10 mEq Oral BID   sertraline  25 mg Oral Daily   sodium chloride flush  3 mL Intravenous Q12H   umeclidinium bromide  1 puff Inhalation Daily   Continuous Infusions:  sodium chloride     PRN Meds:.sodium chloride, hydrALAZINE, ipratropium-albuterol, ondansetron (ZOFRAN) IV, oxyCODONE, sodium chloride flush, traMADol  Xrays No results found.  Assessment/Plan: S/P Procedure(s) (LRB): CORONARY ARTERY BYPASS GRAFTING (CABG) TIMES THREE USING THE LEFT INTERNAL MAMMARY ARTERY (LIMA) AND ENDOSCOPICALLY HARVESTED RIGHT GREATER SAPHENOUS VEIN (N/A) AORTIC VALVE REPLACEMENT (AVR) USING EDWARDS INSPIRIS RESILIA 23 MM AORTIC VALVE (N/A) TRANSESOPHAGEAL ECHOCARDIOGRAM (N/A)  1 afeb,  s BP remains variable, 78-155, ARB adjusted yesterday 2 O2 sats ok on RA 3 will order [place] of right thigh, may need to open incision but no definite cellulitis or abscess on exam, prob some blood in tunnel- now on doxy 4 voiding- not all recorded 5 BS variable 120's-240's range, will increase levemir a little more 6 holding on SNF transfer til more mobile and leg discomfort improved 7 routine pulm hygiene and cardiac rehab     LOS: 12 days    [name] PA-C Pager [phone] 03/14/2023   Agree with above

## 2023-03-14 NOTE — Care Management Important Message (Signed)
Important Message  Patient Details  Name: Joyce Potter MRN: [id number] Date of Birth: January 19, 1946   Medicare Important Message Given:  Yes     Shelda Altes 03/14/2023, 11:30 AM

## 2023-03-14 NOTE — Progress Notes (Signed)
PT Cancellation Note  Patient Details Name: Joyce Potter MRN: [id number] DOB: Oct 08, 1946   Cancelled Treatment:    Reason Eval/Treat Not Completed: (P) Other (comment) (pt worked with MS in AM, attempted PM but pt sleeping. Will continue efforts next date per PT plan of care as schedule permits.)   Carlene Coria 03/14/2023, 5:49 PM

## 2023-03-15 LAB — BASIC METABOLIC PANEL
Anion gap: 11 (ref 5–15)
BUN: 28 mg/dL — ABNORMAL HIGH (ref 8–23)
CO2: 24 mmol/L (ref 22–32)
Calcium: 9 mg/dL (ref 8.9–10.3)
Chloride: 96 mmol/L — ABNORMAL LOW (ref 98–111)
Creatinine, Ser: 1.07 mg/dL — ABNORMAL HIGH (ref 0.44–1.00)
GFR, Estimated: 54 mL/min — ABNORMAL LOW (ref >60)
Glucose, Bld: 230 mg/dL — ABNORMAL HIGH (ref 70–99)
Potassium: 4.8 mmol/L (ref 3.5–5.1)
Sodium: 131 mmol/L — ABNORMAL LOW (ref 135–145)

## 2023-03-15 LAB — GLUCOSE, CAPILLARY
Glucose-Capillary: 234 mg/dL — ABNORMAL HIGH (ref 70–99)
Glucose-Capillary: 214 mg/dL — ABNORMAL HIGH (ref 70–99)
Glucose-Capillary: 239 mg/dL — ABNORMAL HIGH (ref 70–99)

## 2023-03-15 MED ORDER — DOXYCYCLINE HYCLATE 100 MG PO TABS: 100.0000 mg | ORAL_TABLET | Freq: Two times a day (BID) | ORAL | 0 refills | Status: DC

## 2023-03-15 MED ORDER — RISAQUAD PO CAPS
2.0000 | ORAL_CAPSULE | Freq: Three times a day (TID) | ORAL | Status: DC
  Administered 2023-03-15: 2 via ORAL
  Filled 2023-03-15 (×3): qty 2

## 2023-03-15 MED ORDER — OXYCODONE HCL 5 MG PO TABS: 5.0000 mg | ORAL_TABLET | Freq: Four times a day (QID) | ORAL | 0 refills | Status: AC | PRN

## 2023-03-15 MED ORDER — METOPROLOL SUCCINATE ER 100 MG PO TB24: 100.0000 mg | ORAL_TABLET | Freq: Every day | ORAL | Status: DC

## 2023-03-15 MED ORDER — BACID PO TABS: 2.0000 | ORAL_TABLET | Freq: Three times a day (TID) | ORAL | 0 refills | Status: DC

## 2023-03-15 MED ORDER — AMIODARONE HCL 200 MG PO TABS: 200.0000 mg | ORAL_TABLET | Freq: Every day | ORAL | Status: DC

## 2023-03-15 MED ORDER — BACID PO TABS
2.0000 | ORAL_TABLET | Freq: Three times a day (TID) | ORAL | Status: DC
  Filled 2023-03-15: qty 2

## 2023-03-15 MED ORDER — LOSARTAN POTASSIUM 100 MG PO TABS: 100.0000 mg | ORAL_TABLET | Freq: Every day | ORAL | 1 refills | Status: DC

## 2023-03-15 MED ORDER — ASPIRIN 325 MG PO TBEC: 325.0000 mg | DELAYED_RELEASE_TABLET | Freq: Every day | ORAL | Status: DC

## 2023-03-15 NOTE — TOC Transition Note (Signed)
Transition of Care [facility]) - CM/SW Discharge Note   Patient Details  Name: Joyce Potter MRN: [id number] Date of Birth: [birth year]  Transition of Care [facility]) CM/SW Contact:  Vinie Sill, LCSW Phone Number: 03/15/2023, 12:10 PM   Clinical Narrative:     Patient will Discharge to: [facility] Discharge Date: 03/15/2023 Family Notified: daughter Transport By: Corey Harold  Per MD patient is ready for discharge. RN, patient, and facility notified of discharge. Discharge Summary sent to facility. RN given number for [phone] Room 506-P. Ambulance transport requested for patient.   Clinical Social Worker signing off.  Thurmond Butts, MSW, LCSW Clinical Social Worker     Final next level of care: Skilled Nursing Facility Barriers to Discharge: Barriers Resolved   Patient Goals and CMS Choice CMS Medicare.gov Compare Post Acute Care list provided to:: Patient Choice offered to / list presented to : Patient  Discharge Placement                Patient chooses bed at: [facility] Patient to be transferred to facility by: [facility] Name of family member notified: daughter Patient and family notified of of transfer: 03/15/23  Discharge Plan and Services Additional resources added to the After Visit Summary for   In-house Referral: Clinical Social Work Discharge Planning Services: CM Consult                                 Social Determinants of Health (SDOH) Interventions SDOH Screenings   Food Insecurity: No Food Insecurity (03/14/2023)  Housing: Low Risk  (03/14/2023)  Transportation Needs: No Transportation Needs (03/14/2023)  Utilities: Not At Risk (03/14/2023)  Alcohol Screen: Low Risk  (12/16/2021)  Depression (PHQ2-9): Low Risk  (05/31/2022)  Financial Resource Strain: Low Risk  (05/31/2022)  Social Connections: Socially Integrated (12/16/2021)  Stress: No Stress Concern Present (02/18/2022)  Tobacco Use: Medium Risk (03/03/2023)     Readmission Risk  Interventions     No data to display

## 2023-03-15 NOTE — Progress Notes (Signed)
Physical Therapy Treatment Patient Details Name: Joyce Potter MRN: [id number] DOB: 07-Jan-1946 Today's Date: 03/15/2023   History of Present Illness Pt is 77 yo female admitted 03/02/23 for CABG x 2 and AVR. PMH - CAD, severe aortic stenosis, HTN, DM, lt eye central blindness, GIB.    PT Comments    Pt received in supine, agreeable to therapy session and with good participation and tolerance for transfer, gait and stair instruction. Pt making good progress toward goals, but needs verbal cues for compliance with Move in the Tube precautions to prevent extra stress on sternal incision. Pt reports only intermittent minimal soreness on her RLE this date and was instructed on use of cryotherapy PRN for edema/pain along with frequency, pt defers ice during session. Pt needing up to min guard for functional mobility tasks including gait/stair training with RW. Pt making good progress toward goals, plan to have pt perform DGI balance assessment vs Berg next session for further dynamic standing balance/strengthening. Pt continues to benefit from PT services to progress toward functional mobility goals.    Recommendations for follow up therapy are one component of a multi-disciplinary discharge planning process, led by the attending physician.  Recommendations may be updated based on patient status, additional functional criteria and insurance authorization.  Follow Up Recommendations  Skilled nursing-short term rehab (<3 hours/day) Can patient physically be transported by private vehicle: Yes   Assistance Recommended at Discharge Frequent or constant Supervision/Assistance  Patient can return home with the following A little help with walking and/or transfers;A little help with bathing/dressing/bathroom;Assistance with cooking/housework;Assist for transportation;Help with stairs or ramp for entrance   Equipment Recommendations  None recommended by PT    Recommendations for Other Services        Precautions / Restrictions Precautions Precautions: Sternal;Fall Precaution Booklet Issued: Yes (comment) Precaution Comments: pt continues to require cues to adhere to sternal precautions Restrictions Weight Bearing Restrictions: No Other Position/Activity Restrictions: sternal precs (Move in the Tube)     Mobility  Bed Mobility Overal bed mobility: Needs Assistance Bed Mobility: Rolling, Sidelying to Sit Rolling: Supervision Sidelying to sit: Supervision, HOB elevated       General bed mobility comments: cues not to wing out her bottom elbow to push on bed when raising trunk with log roll transfer to EOB, and to push only with elbows tucked in to body.    Transfers Overall transfer level: Needs assistance Equipment used: Rolling walker (2 wheels) Transfers: Sit to/from Stand Sit to Stand: Supervision           General transfer comment: cues for hand placement and safety; no LOB or steadying assist needed from bed height>rollator and RW>EOB. Pt defers toilet transfer training or to sit in chair due to impending DC.    Ambulation/Gait Ambulation/Gait assistance: Min guard Gait Distance (Feet): 100 Feet Assistive device: Rolling walker (2 wheels), Rollator (4 wheels) Gait Pattern/deviations: Step-through pattern, Decreased stride length (bilateral hip IR)       General Gait Details: pt impulsive with quick speed needing cues to slow gait to control RW and step into RW; pt tends to slightly wing elbows out when using RW, cues for upright posture and Move in the Tube precautions intermittently; HR WFL, no significant dyspnea this date. Rollator ~53ft, regular RW rest of the distance.   Stairs Stairs: Yes Stairs assistance: Min guard Stair Management: Two rails, Step to pattern, Forwards Number of Stairs: 5 (single 7" platform step in room x5 reps) General stair comments: pt reports  4 stairs to enter at back; 8 steps to enter front and normally goes in through the  back; cues not to wing her elbows out when using rail and for step sequencing   Wheelchair Mobility    Modified Rankin (Stroke Patients Only)       Balance Overall balance assessment: Needs assistance Sitting-balance support: No upper extremity supported, Feet supported Sitting balance-Leahy Scale: Good     Standing balance support: Bilateral upper extremity supported, Reliant on assistive device for balance Standing balance-Leahy Scale: Fair Standing balance comment: Fair with single UE support, Poor unsupported, does well with RW/rollator                            Cognition Arousal/Alertness: Awake/alert Behavior During Therapy: WFL for tasks assessed/performed Overall Cognitive Status: Impaired/Different from baseline Area of Impairment: Memory                     Memory: Decreased recall of precautions         General Comments: Pt needs reminders for Move in the Tube with WB (use of rail for stair ascent/use of RW), pt tends to wing elbows out with use of AD/rail.        Exercises      General Comments General comments (skin integrity, edema, etc.): BP 135/59 (81) sitting EOB HR 67 bpm; BP 135/54 (73) standing after ~5 mins, HR 79 bpm (post-stair trial), no dizziness in either posture. BP had been soft earlier in AM but pt had no symptoms.      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 PT Goal Formulation: With patient Time For Goal Achievement: 03/20/23 Progress towards PT goals: Progressing toward goals    Frequency    Min 3X/week      PT Plan Current plan remains appropriate       AM-PAC PT "6  Clicks" Mobility   Outcome Measure  Help needed turning from your back to your side while in a flat bed without using bedrails?: A Little Help needed moving from lying on your back to sitting on the side of a flat bed without using bedrails?: A Little Help needed moving to and from a bed to a chair (including a wheelchair)?: A Little Help needed standing up from a chair using your arms (e.g., wheelchair or bedside chair)?: A Little Help needed to walk in hospital room?: A Little Help needed climbing 3-5 steps with a railing? : A Little 6 Click Score: 18    End of Session Equipment Utilized During Treatment: Gait belt Activity Tolerance: Patient tolerated treatment well Patient left: in bed;with call bell/phone within reach;with nursing/sitter in room Nurse Communication: Mobility status PT Visit Diagnosis: Unsteadiness on feet (R26.81);Other abnormalities of gait and mobility (R26.89);Muscle weakness (generalized) (M62.81)     Time: [id number] PT Time Calculation (min) (ACUTE ONLY): 13 min  Charges:  $Gait Training: 8-22 mins                     Delissa Silba P., PTA Acute Rehabilitation Services Secure Chat Preferred 9a-5:30pm Office: [place] 03/15/2023,  1:42 PM

## 2023-03-15 NOTE — Progress Notes (Addendum)
Chest tube sutures removed but one prior to D/C. MD aware and will take out in office at follow up apt.

## 2023-03-15 NOTE — Progress Notes (Signed)
[address]       [place] [address]             [phone]     13 Days Post-Op Procedure(s) (LRB): CORONARY ARTERY BYPASS GRAFTING (CABG) TIMES THREE USING THE LEFT INTERNAL MAMMARY ARTERY (LIMA) AND ENDOSCOPICALLY HARVESTED RIGHT GREATER SAPHENOUS VEIN (N/A) AORTIC VALVE REPLACEMENT (AVR) USING EDWARDS INSPIRIS RESILIA 23 MM AORTIC VALVE (N/A) TRANSESOPHAGEAL ECHOCARDIOGRAM (N/A) Subjective: Feels well, no specific c/o  Objective: Vital signs in last 24 hours: Temp:  [98.1 F (36.7 C)-98.7 F (37.1 C)] 98.4 F (36.9 C) (03/19 0332) Pulse Rate:  [65-71] 65 (03/19 0332) Cardiac Rhythm: Normal sinus rhythm (03/18 1900) Resp:  [16-20] 20 (03/19 0332) BP: (113-146)/(43-50) 132/48 (03/19 0332) SpO2:  [90 %-98 %] 90 % (03/19 0332) Weight:  [87.7 kg] 87.7 kg (03/19 0332)  Hemodynamic parameters for last 24 hours:    Intake/Output from previous day: No intake/output data recorded. Intake/Output this shift: No intake/output data recorded.  General appearance: alert, cooperative, and no distress Heart: regular rate and rhythm and no murmur Lungs: dim in bases Abdomen: benign Extremities: no edema Wound: incis ok, slight erethema at evh site, some old blood in tunnel, no definite hematoma  Lab Results: No results for input(s): "WBC", "HGB", "HCT", "PLT" in the last 72 hours. BMET:  Recent Labs    03/15/23 0111  NA 131*  K 4.8  CL 96*  CO2 24  GLUCOSE 230*  BUN 28*  CREATININE 1.07*  CALCIUM 9.0    PT/INR: No results for input(s): "LABPROT", "INR" in the last 72 hours. ABG    Component Value Date/Time   PHART 7.352 03/05/2023 0117   HCO3 22.5 03/05/2023 0117   TCO2 24 03/05/2023 0117   ACIDBASEDEF 3.0 (H) 03/05/2023 0117   O2SAT 74.8 03/07/2023 0334   CBG (last 3)  Recent Labs    03/14/23 1736 03/14/23 2111 03/15/23 0549  GLUCAP 277* 201* 234*    Meds Scheduled Meds:  amiodarone  200 mg Oral Daily   aspirin EC  325 mg Oral  Daily   Or   aspirin  324 mg Per Tube Daily   [place] [facility] Patient [facility] Family Education   Does not apply Once   docusate sodium  200 mg Oral Daily   doxycycline  100 mg Oral Q12H   furosemide  40 mg Oral Daily   insulin aspart  0-15 Units Subcutaneous TID WC   insulin aspart  0-5 Units Subcutaneous QHS   insulin detemir  28 Units Subcutaneous Daily   lactose free nutrition  237 mL Oral TID WC   losartan  100 mg Oral Daily   metoprolol succinate  100 mg Oral Daily   multivitamin with minerals  1 tablet Oral Daily   pantoprazole  40 mg Oral Daily   polyethylene glycol  17 g Oral Daily   potassium chloride  10 mEq Oral BID   sertraline  25 mg Oral Daily   sodium chloride flush  3 mL Intravenous Q12H   umeclidinium bromide  1 puff Inhalation Daily   Continuous Infusions:  sodium chloride     PRN Meds:.sodium chloride, hydrALAZINE, ipratropium-albuterol, ondansetron (ZOFRAN) IV, oxyCODONE, sodium chloride flush, traMADol  Xrays No results found.  Assessment/Plan: S/P Procedure(s) (LRB): CORONARY ARTERY BYPASS GRAFTING (CABG) TIMES THREE USING THE LEFT INTERNAL MAMMARY ARTERY (LIMA) AND ENDOSCOPICALLY HARVESTED RIGHT GREATER SAPHENOUS VEIN (N/A) AORTIC VALVE REPLACEMENT (AVR) USING EDWARDS INSPIRIS RESILIA 23 MM AORTIC  VALVE (N/A) TRANSESOPHAGEAL ECHOCARDIOGRAM (N/A)  1 afeb, VSS 2 O2 sats good on RA 3 weight stable 4 BS fair control, will resume home meds at d/c  5 sodium a bit lower, normal renal fxn 6 [place] not done, with good clinical improvement can cancel  7 finish course of doxy, will add probiotic 8 stable for tx to SNF     LOS: 13 days    Gaspar Bidding Pager [phone] 03/15/2023

## 2023-03-15 NOTE — Progress Notes (Signed)
Pt declined ambulation, wants to rest before leaving today. Denies questions about restriction, activity progression, sternal precautions, diet, and other.   Joyce Potter 03/15/2023 8:32 AM

## 2023-03-16 ENCOUNTER — Encounter: Payer: Self-pay | Admitting: Oncology

## 2023-03-17 DIAGNOSIS — E11319 Type 2 diabetes mellitus with unspecified diabetic retinopathy without macular edema: Secondary | ICD-10-CM

## 2023-03-17 DIAGNOSIS — I25708 Atherosclerosis of coronary artery bypass graft(s), unspecified, with other forms of angina pectoris: Secondary | ICD-10-CM

## 2023-03-17 DIAGNOSIS — I10 Essential (primary) hypertension: Secondary | ICD-10-CM

## 2023-03-17 DIAGNOSIS — E785 Hyperlipidemia, unspecified: Secondary | ICD-10-CM

## 2023-03-22 ENCOUNTER — Ambulatory Visit: Payer: Self-pay

## 2023-03-23 ENCOUNTER — Ambulatory Visit: Payer: Medicare HMO | Admitting: Nurse Practitioner

## 2023-03-23 ENCOUNTER — Ambulatory Visit
Admission: RE | Admit: 2023-03-23 | Discharge: 2023-03-23 | Disposition: A | Discharge status: Home / Self Care | Payer: Medicare HMO | Source: Ambulatory Visit | Attending: Thoracic Surgery (Cardiothoracic Vascular Surgery) | Admitting: Thoracic Surgery (Cardiothoracic Vascular Surgery)

## 2023-03-23 ENCOUNTER — Encounter: Payer: Self-pay | Admitting: Oncology

## 2023-03-23 ENCOUNTER — Ambulatory Visit (INDEPENDENT_AMBULATORY_CARE_PROVIDER_SITE_OTHER): Payer: Self-pay | Admitting: Surgical

## 2023-03-23 VITALS — BP 170/82 | HR 68 | Resp 20 | Ht 63.0 in | Wt 199.0 lb

## 2023-03-23 DIAGNOSIS — I25118 Atherosclerotic heart disease of native coronary artery with other forms of angina pectoris: Secondary | ICD-10-CM

## 2023-03-23 DIAGNOSIS — I350 Nonrheumatic aortic (valve) stenosis: Secondary | ICD-10-CM

## 2023-03-23 DIAGNOSIS — Z09 Encounter for follow-up examination after completed treatment for conditions other than malignant neoplasm: Secondary | ICD-10-CM

## 2023-03-23 DIAGNOSIS — Z951 Presence of aortocoronary bypass graft: Secondary | ICD-10-CM

## 2023-03-23 NOTE — Progress Notes (Signed)
[address]       [place] [address]             [phone]      [place] [facility] Medical Record [id number] Date of Birth: 07-Dec-1946  Referring: [name], MD Primary Care: Garwin Brothers, MD Primary Cardiologist: Ida Rogue, MD   Chief Complaint:   POST OP FOLLOW UP 03/02/2023 Joyce Potter [id number]   Surgeon:  Everlena Cooper, MD   First Assistant: Wynelle Beckmann [facility]                                Preoperative Diagnosis:  Aortic Stenosis                                             Coronary artery disease   Postoperative Diagnosis:  Same     Procedure: Aortic Valve Replacement with a 71mm Inspiris Pericardial valve (SN [id number])                      Coronary artery bypass x 2  with the LIMA to the LAD and RSVG from the aorta to the Diagonal coronary artery       Anesthesia:  General Endotracheal   History of Present Illness: The patient is a 77 year old female status post the above described procedure seen in the office on today's date and routine postsurgical follow-up.  Following discharge she was sent to a skilled nursing facility for further rehabilitation.  She reports that she is doing very well in terms of pain control not requiring any analgesics.  She does report that her sugars have been low at times and sometimes insulin has been required to be held.  She had some significant ecchymosis in the right thigh which is showing a steady improvement.  She developed a mild cellulitis and is received a course of doxycycline.  She does have some mild shortness of breath describes steady but slow improvement in overall deconditioning.  She is not having any chest pain or anginal equivalents.  Does report some tachycardia with ambulation.  Overall she appears to be pleased with progress.         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   Sleep apnea    Stroke ([place]) 2018   ischemic     Social History   Tobacco Use  Smoking Status Former   Types: Cigarettes   Quit date: 02/15/1985   Years since quitting: 38.1  Smokeless Tobacco Never  Tobacco Comments   quit about 31 years ago    Social History   Substance and Sexual Activity  Alcohol Use No   Alcohol/week: 0.0 standard drinks of alcohol     Allergies  Allergen Reactions   Sulfa Antibiotics Other (See Comments)    Rash and flushing   Invokamet [Canagliflozin-Metformin Hcl] Other (See Comments)    Yeast Infection   Lovastatin Rash   Penicillins Rash    Has patient had a PCN reaction causing immediate rash, facial/tongue/throat swelling, SOB or lightheadedness with hypotension:Yes Has patient had a PCN reaction causing severe rash involving mucus membranes or skin necrosis:all over body Has patient had a PCN reaction that required hospitalization: No Has patient had a PCN reaction occurring within the last 10 years: No If all of the above answers are "NO", then may proceed with Cephalosporin use.    Vicodin [Hydrocodone-Acetaminophen] Rash    Current Outpatient Medications  Medication Sig Dispense Refill   albuterol (VENTOLIN HFA) 108 (90 Base) MCG/ACT inhaler Inhale 2 puffs into the lungs every 4 (four) hours as needed for wheezing or shortness of breath.      amiodarone (PACERONE) 200 MG tablet Take 1 tablet (200 mg total) by mouth daily.     amitriptyline  (ELAVIL) 25 MG tablet Take 25 mg by mouth at bedtime as needed for sleep.      aspirin EC 325 MG tablet Take 1 tablet (325 mg total) by mouth daily.     BD INSULIN SYRINGE U/F 31G X 5/16" 1 ML MISC      doxycycline (VIBRA-TABS) 100 MG tablet Take 1 tablet (100 mg total) by mouth every 12 (twelve) hours. 10 tablet 0   Dulaglutide 3 MG/0.5ML SOPN Inject 3 mg into the skin every Saturday.     Evolocumab (REPATHA SURECLICK) [id number] MG/ML SOAJ Inject 140 mg into the skin every 14 (fourteen) days. 2 mL 11   insulin NPH-regular Human (NOVOLIN 70/30) (70-30) 100 UNIT/ML injection Inject 30 Units into the skin with breakfast, with lunch, and with evening meal.     lactobacillus acidophilus (BACID) TABS tablet Take 2 tablets by mouth 3 (three) times daily. 15 tablet 0   losartan (COZAAR) 100 MG tablet Take 1 tablet (100 mg total) by mouth daily. 30 tablet 1   metoprolol succinate (TOPROL-XL) 100 MG 24 hr tablet Take 1 tablet (100 mg total) by mouth daily. Take with or immediately following a meal.     Multiple Vitamin (MULTI-VITAMIN) tablet Take 1 tablet by mouth daily.     ONETOUCH VERIO test strip      pantoprazole (PROTONIX) 40 MG tablet Take 40 mg by mouth daily.     sertraline (ZOLOFT) 25 MG tablet Take 1 tablet (25 mg total) by mouth daily. 30 tablet 0   SPIRIVA HANDIHALER 18 MCG inhalation capsule Place 18 mcg into inhaler and inhale at bedtime.     Vitamin A 2400 MCG (8000 UT) CAPS Take 8,000 Units by mouth daily.     No current facility-administered medications for this visit.   Facility-Administered Medications Ordered in Other Visits  Medication Dose Route Frequency Provider Last Rate Last Admin   heparin lock flush 100 unit/mL  500 Units Intravenous Once Verlon Au, NP       sodium chloride flush (NS) 0.9 % injection 10 mL  10 mL Intravenous Once Verlon Au, NP       sodium chloride flush (NS) 0.9 % injection 3 mL  3 mL Intravenous Q12H  Scoggins, Museum/gallery conservator, NP           Physical  Exam: There were no vitals taken for this visit.  General appearance: alert, cooperative, and no distress Heart: regular rate and rhythm Lungs: Minimally diminished in the bases Abdomen: Benign exam Extremities: Minor edema Wound: Minimal erythema at the [facility] incision, no erythema, ecchymosis has significantly improved.  There is some old blood in the tunnel.   Diagnostic Studies & Laboratory data:     Recent Radiology Findings:   DG Chest 2 View  Result Date: 03/23/2023 CLINICAL DATA:  Status post aortic valve replacement. Some shortness of breath. EXAM: CHEST - 2 VIEW COMPARISON:  03/10/2023.  CT, 02/15/2023. FINDINGS: Changes from recent cardiac surgery and aortic valve replacement are without change from the most recent prior exam. Cardiac silhouette is mildly enlarged. No mediastinal or hilar masses. No mediastinal widening. No evidence of adenopathy. Small pleural effusions blunt the posterior costophrenic sulci. Additional lung base opacity, most evident on the left is also noted consistent with atelectasis. Remainder of the lungs is clear. No pulmonary edema. No pneumothorax. Skeletal structures are intact. IMPRESSION: 1. No acute findings. Interstitial edema noted on the most recent prior study has resolved. 2. Small bilateral pleural effusions with associated basilar atelectasis, mildly decreased from the most recent prior exam. Electronically Signed   By: Lajean Manes M.D.   On: 03/23/2023 14:30      Recent Lab Findings: Lab Results  Component Value Date   WBC 7.4 03/09/2023   HGB 9.2 (L) 03/09/2023   HCT 28.2 (L) 03/09/2023   PLT 281 03/09/2023   GLUCOSE 230 (H) 03/15/2023   CHOL 130 05/21/2020   TRIG 81 05/21/2020   HDL 35 (L) 05/21/2020   LDLCALC 79 05/21/2020   ALT 6 03/07/2023   AST 19 03/07/2023   NA 131 (L) 03/15/2023   K 4.8 03/15/2023   CL 96 (L) 03/15/2023   CREATININE 1.07 (H) 03/15/2023   BUN 28 (H) 03/15/2023   CO2 24 03/15/2023   INR 1.5 (H) 03/02/2023    HGBA1C 5.6 02/25/2023      Assessment / Plan: Steady overall progress early post discharge.  I did recommend in a nursing facility paperwork to decrease insulin to NPH 70/30 15 units twice daily for now as she is having some hypoglycemic events.  She is to continue routine advancement activity and any therapies that are available at the facility.  Probably will not require significant further stay but family reports they are working on home where there has been some issues with mold.  I reviewed the patient's chest x-ray and it appears to show a steady improvement.  I did not make any further changes in medications.  We will see the patient again in 2 to 3 weeks with a repeat chest x-ray.        Medication Changes: No orders of the defined types were placed in this encounter.     [name], PA-C  03/23/2023 2:45 PM

## 2023-03-23 NOTE — Patient Instructions (Signed)
Continue postop instructions as previously

## 2023-04-08 ENCOUNTER — Other Ambulatory Visit: Payer: Self-pay | Admitting: Thoracic Surgery (Cardiothoracic Vascular Surgery)

## 2023-04-08 DIAGNOSIS — Z952 Presence of prosthetic heart valve: Secondary | ICD-10-CM

## 2023-04-10 NOTE — Progress Notes (Unsigned)
[address]       [place] [address]             [phone]           Joyce Potter [facility] Medical Record #[id number] Date of Birth: 08/30/1946  Joyce Iba, MD [name], MD  Chief Complaint: SP  AVR/CABG x2  History of Present Illness:     77 yo who underwent above on 3/6 and spent a week at nursing facility and is now home. She feels that her breathing is not as good as she would like with a need to take her rescue inhaler more often. She has no CP, no lower ext edema or weight gain. She had CXR today that was completely clear. She has a childhood history of asthma and her taking her allegra has made her feel better and opened up a clogged ear. She is concerned about taking it with her other meds. She has fallen over her dog recently but feels she wasn't hurt      Past Medical History:  Diagnosis Date   ADHD (attention deficit hyperactivity disorder)    Anemia    iron deficiency   Anxiety    Aortic stenosis    Arthritis    Asthma    Basal cell carcinoma    CAD (coronary artery disease)    s/p Left circumflex stent in 2012   Carotid stenosis    Cataract    Heart murmur    High cholesterol    HTN (hypertension)    Hyperlipidemia    IDDM (insulin dependent diabetes mellitus)    Multiple thyroid nodules    Benign   Neuromuscular disorder (HCC)    Peptic ulcer disease    Pneumonia 1952   PONV (postoperative nausea and vomiting)    Retinal artery occlusion    on left   Sleep apnea    Stroke (HCC) 2018   ischemic    Past Surgical History:  Procedure Laterality Date   ABDOMINAL HYSTERECTOMY     AORTIC VALVE REPLACEMENT N/A 03/02/2023   Procedure: AORTIC VALVE REPLACEMENT (AVR) USING EDWARDS INSPIRIS RESILIA 23 MM AORTIC VALVE;  Surgeon: Eugenio Hoes, MD;  Location: [facility] OR;  Service: Open Heart Surgery;  Laterality: N/A;   ARTERY BIOPSY Left 11/19/2015   Procedure: BIOPSY TEMPORAL ARTERY;  Surgeon: Annice Needy, MD;  Location:  [facility] ORS;  Service: Vascular;  Laterality: Left;   BREAST BIOPSY Left 2002   core- neg   BREAST EXCISIONAL BIOPSY     ??? not visible scar on neither breast- pt believes it to be right breast    CARDIAC CATHETERIZATION N/A 08/08/2015   Procedure: Right and Left Heart Cath and Coronary Angiography;  Surgeon: Dalia Heading, MD;  Location: [facility] INVASIVE CV LAB;  Service: Cardiovascular;  Laterality: N/A;   CARDIAC CATHETERIZATION Bilateral 09/03/2016   Procedure: Right/Left Heart Cath and Coronary Angiography;  Surgeon: Dalia Heading, MD;  Location: [facility];  Service: Cardiovascular;  Laterality: Bilateral;   CATARACT EXTRACTION W/ INTRAOCULAR LENS  IMPLANT, BILATERAL     COLONOSCOPY     in 2013- internal hemorrhoids   COLONOSCOPY WITH PROPOFOL N/A 07/07/2018   Procedure: COLONOSCOPY WITH PROPOFOL;  Surgeon: Scot Jun, MD;  Location: [facility] ENDOSCOPY;  Service: Endoscopy;  Laterality: N/A;   COLONOSCOPY WITH PROPOFOL N/A 05/22/2020   Procedure: COLONOSCOPY WITH PROPOFOL;  Surgeon: Wyline Mood, MD;  Location: [facility] ENDOSCOPY;  Service: Gastroenterology;  Laterality: N/A;   CORONARY ANGIOGRAPHY N/A 09/14/2017   Procedure: CORONARY ANGIOGRAPHY;  Surgeon: Dalia Heading, MD;  Location: [facility];  Service: Cardiovascular;  Laterality: N/A;   CORONARY ANGIOPLASTY     CORONARY ARTERY BYPASS GRAFT N/A 03/02/2023   Procedure: CORONARY ARTERY BYPASS GRAFTING (CABG) TIMES THREE USING THE LEFT INTERNAL MAMMARY ARTERY (LIMA) AND ENDOSCOPICALLY HARVESTED RIGHT GREATER SAPHENOUS VEIN;  Surgeon: Eugenio Hoes, MD;  Location: [facility] OR;  Service: Open Heart Surgery;  Laterality: N/A;   CORONARY STENT INTERVENTION N/A 05/15/2020   Procedure: coronary angiography;  Surgeon: Dalia Heading, MD;  Location: [facility];  Service: Cardiovascular;  Laterality: N/A;   CORONARY STENT INTERVENTION N/A 05/15/2020   Procedure: CORONARY STENT INTERVENTION;  Surgeon: Marcina Millard, MD;   Location: [facility];  Service: Cardiovascular;  Laterality: N/A;   CORONARY STENT PLACEMENT     ENDARTERECTOMY Left 10/01/2015   Procedure: ENDARTERECTOMY CAROTID;  Surgeon: Annice Needy, MD;  Location: [facility] ORS;  Service: Vascular;  Laterality: Left;   ENTEROSCOPY N/A 07/24/2020   Procedure: ENTEROSCOPY;  Surgeon: Pasty Spillers, MD;  Location: [facility] ENDOSCOPY;  Service: Endoscopy;  Laterality: N/A;   ESOPHAGOGASTRODUODENOSCOPY     ESOPHAGOGASTRODUODENOSCOPY (EGD) WITH PROPOFOL N/A 05/31/2016   Procedure: ESOPHAGOGASTRODUODENOSCOPY (EGD) WITH PROPOFOL;  Surgeon: Scot Jun, MD;  Location: [facility] ENDOSCOPY;  Service: Endoscopy;  Laterality: N/A;   ESOPHAGOGASTRODUODENOSCOPY (EGD) WITH PROPOFOL N/A 07/07/2018   Procedure: ESOPHAGOGASTRODUODENOSCOPY (EGD) WITH PROPOFOL;  Surgeon: Scot Jun, MD;  Location: [facility] ENDOSCOPY;  Service: Endoscopy;  Laterality: N/A;   ESOPHAGOGASTRODUODENOSCOPY (EGD) WITH PROPOFOL N/A 07/17/2018   Procedure: ESOPHAGOGASTRODUODENOSCOPY (EGD) WITH PROPOFOL;  Surgeon: Midge Minium, MD;  Location: [facility] ENDOSCOPY;  Service: Endoscopy;  Laterality: N/A;   ESOPHAGOGASTRODUODENOSCOPY (EGD) WITH PROPOFOL N/A 05/21/2020   Procedure: ESOPHAGOGASTRODUODENOSCOPY (EGD) WITH PROPOFOL;  Surgeon: Wyline Mood, MD;  Location: [facility] ENDOSCOPY;  Service: Gastroenterology;  Laterality: N/A;   ESOPHAGOGASTRODUODENOSCOPY (EGD) WITH PROPOFOL N/A 06/13/2020   Procedure: ESOPHAGOGASTRODUODENOSCOPY (EGD) WITH PROPOFOL;  Surgeon: Midge Minium, MD;  Location: [facility] ENDOSCOPY;  Service: Endoscopy;  Laterality: N/A;   EYE SURGERY     GIVENS CAPSULE STUDY N/A 04/17/2013   Procedure: GIVENS CAPSULE STUDY;  Surgeon: Willis Modena, MD;  Location: [facility] ENDOSCOPY;  Service: Endoscopy;  Laterality: N/A;  patient ate breakfast at 7am    GIVENS CAPSULE STUDY N/A 06/11/2020   Procedure: GIVENS CAPSULE STUDY;  Surgeon: Pasty Spillers, MD;  Location: [facility] ENDOSCOPY;  Service: Endoscopy;   Laterality: N/A;   GIVENS CAPSULE STUDY N/A 01/29/2021   Procedure: GIVENS CAPSULE STUDY;  Surgeon: Pasty Spillers, MD;  Location: [facility] ENDOSCOPY;  Service: Endoscopy;  Laterality: N/A;   GIVENS CAPSULE STUDY N/A 08/24/2021   Procedure: GIVENS CAPSULE STUDY;  Surgeon: Pasty Spillers, MD;  Location: [facility] ENDOSCOPY;  Service: Endoscopy;  Laterality: N/A;   HEMORRHOID BANDING  07/27/2016   Dr Malissa Hippo   LEFT HEART CATH AND CORONARY ANGIOGRAPHY N/A 07/08/2022   Procedure: LEFT HEART CATH AND CORONARY ANGIOGRAPHY;  Surgeon: Laurier Nancy, MD;  Location: [facility];  Service: Cardiovascular;  Laterality: N/A;   PARTIAL HYSTERECTOMY     RIGHT AND LEFT HEART CATH Bilateral 09/14/2017   Procedure: RIGHT AND LEFT HEART CATH;  Surgeon: Dalia Heading, MD;  Location: [facility];  Service: Cardiovascular;  Laterality: Bilateral;   RIGHT HEART CATH AND CORONARY ANGIOGRAPHY N/A 01/26/2023   Procedure: RIGHT HEART CATH AND CORONARY ANGIOGRAPHY;  Surgeon: Orbie Pyo, MD;  Location: [facility] INVASIVE CV LAB;  Service: Cardiovascular;  Laterality: N/A;   RIGHT/LEFT HEART CATH AND CORONARY ANGIOGRAPHY Right 05/15/2020   Procedure: Right/Left Heart cath;  Surgeon: Dalia Heading, MD;  Location: [facility];  Service: Cardiovascular;  Laterality: Right;   TEE WITHOUT CARDIOVERSION N/A 03/02/2023   Procedure: TRANSESOPHAGEAL ECHOCARDIOGRAM;  Surgeon: Eugenio Hoes, MD;  Location: [facility] OR;  Service: Open Heart Surgery;  Laterality: N/A;   TRIGGER FINGER RELEASE      Social History   Tobacco Use  Smoking Status Former   Types: Cigarettes   Quit date: 02/15/1985   Years since quitting: 38.1  Smokeless Tobacco Never  Tobacco Comments   quit about 31 years ago    Social History   Substance and Sexual Activity  Alcohol Use No   Alcohol/week: 0.0 standard drinks of alcohol    Social History   Socioeconomic History   Marital status: Married    Spouse name: Jonny Ruiz   Number  of children: 5   Years of education: Not on file   Highest education level: Not on file  Occupational History   Occupation: retired   Tobacco Use   Smoking status: Former    Types: Cigarettes    Quit date: 02/15/1985    Years since quitting: 38.1   Smokeless tobacco: Never   Tobacco comments:    quit about 31 years ago  Vaping Use   Vaping Use: Never used  Substance and Sexual Activity   Alcohol use: No    Alcohol/week: 0.0 standard drinks of alcohol   Drug use: No   Sexual activity: Not on file  Other Topics Concern   Not on file  Social History Narrative   Lives at home independently.   Organ donor   Had 5 children- son timothy   Son died in 45   Social Determinants of Health   Financial Resource Strain: Low Risk  (05/31/2022)   Overall Financial Resource Strain (CARDIA)    Difficulty of Paying Living Expenses: Not hard at all  Food Insecurity: No Food Insecurity (03/14/2023)   Hunger Vital Sign    Worried About Running Out of Food in the Last Year: Never true    Ran Out of Food in the Last Year: Never true  Transportation Needs: No Transportation Needs (03/14/2023)   PRAPARE - Administrator, Civil Service (Medical): No    Lack of Transportation (Non-Medical): No  Physical Activity: Not on file  Stress: No Stress Concern Present (02/18/2022)   [employer] of Occupational Health - Occupational Stress Questionnaire    Feeling of Stress : Only a little  Social Connections: Socially Integrated (12/16/2021)   Social Connection and Isolation Panel [NHANES]    Frequency of Communication with Friends and Family: More than three times a week    Frequency of Social Gatherings with Friends and Family: More than three times a week    Attends Religious Services: More than 4 times per year    Active Member of [employer] or Organizations: Yes    Attends Banker Meetings: More than 4 times per year    Marital Status: Married  Catering manager Violence: Not  At Risk (03/14/2023)   Humiliation, Afraid, Rape, and Kick questionnaire    Fear of Current or Ex-Partner: No    Emotionally Abused: No    Physically Abused: No    Sexually Abused: No    Allergies  Allergen Reactions  Sulfa Antibiotics Other (See Comments)    Rash and flushing   Invokamet [Canagliflozin-Metformin Hcl] Other (See Comments)    Yeast Infection   Lovastatin Rash   Penicillins Rash    Has patient had a PCN reaction causing immediate rash, facial/tongue/throat swelling, SOB or lightheadedness with hypotension:Yes Has patient had a PCN reaction causing severe rash involving mucus membranes or skin necrosis:all over body Has patient had a PCN reaction that required hospitalization: No Has patient had a PCN reaction occurring within the last 10 years: No If all of the above answers are "NO", then may proceed with Cephalosporin use.    Vicodin [Hydrocodone-Acetaminophen] Rash    Current Outpatient Medications  Medication Sig Dispense Refill   albuterol (VENTOLIN HFA) 108 (90 Base) MCG/ACT inhaler Inhale 2 puffs into the lungs every 4 (four) hours as needed for wheezing or shortness of breath.      amiodarone (PACERONE) 200 MG tablet Take 1 tablet (200 mg total) by mouth daily.     amitriptyline (ELAVIL) 25 MG tablet Take 25 mg by mouth at bedtime as needed for sleep.      aspirin EC 325 MG tablet Take 1 tablet (325 mg total) by mouth daily.     BD INSULIN SYRINGE U/F 31G X 5/16" 1 ML MISC      doxycycline (VIBRA-TABS) 100 MG tablet Take 1 tablet (100 mg total) by mouth every 12 (twelve) hours. 10 tablet 0   Dulaglutide 3 MG/0.5ML SOPN Inject 3 mg into the skin every Saturday.     Evolocumab (REPATHA SURECLICK) 140 MG/ML SOAJ Inject 140 mg into the skin every 14 (fourteen) days. 2 mL 11   insulin NPH-regular Human (NOVOLIN 70/30) (70-30) 100 UNIT/ML injection Inject 30 Units into the skin with breakfast, with lunch, and with evening meal.     lactobacillus acidophilus  (BACID) TABS tablet Take 2 tablets by mouth 3 (three) times daily. 15 tablet 0   losartan (COZAAR) 100 MG tablet Take 1 tablet (100 mg total) by mouth daily. 30 tablet 1   metoprolol succinate (TOPROL-XL) 100 MG 24 hr tablet Take 1 tablet (100 mg total) by mouth daily. Take with or immediately following a meal.     Multiple Vitamin (MULTI-VITAMIN) tablet Take 1 tablet by mouth daily.     ONETOUCH VERIO test strip      pantoprazole (PROTONIX) 40 MG tablet Take 40 mg by mouth daily.     sertraline (ZOLOFT) 25 MG tablet Take 1 tablet (25 mg total) by mouth daily. 30 tablet 0   SPIRIVA HANDIHALER 18 MCG inhalation capsule Place 18 mcg into inhaler and inhale at bedtime.     Vitamin A 2400 MCG (8000 UT) CAPS Take 8,000 Units by mouth daily.     No current facility-administered medications for this visit.   Facility-Administered Medications Ordered in Other Visits  Medication Dose Route Frequency Provider Last Rate Last Admin   heparin lock flush 100 unit/mL  500 Units Intravenous Once Alinda Dooms, NP       sodium chloride flush (NS) 0.9 % injection 10 mL  10 mL Intravenous Once Alinda Dooms, NP       sodium chloride flush (NS) 0.9 % injection 3 mL  3 mL Intravenous Q12H Scoggins, Amber, NP         Family History  Problem Relation Age of Onset   Uterine cancer Mother    Breast cancer Mother 54   Seizures Father    Stroke Father  Diabetes Father    COPD Father    Colon cancer Neg Hx        Physical Exam: Appears well Lungs: no wheezing and otherwise clear Card: RR with soft murmur Ext: no edema Incisions: clean and dry. I removed a remnant of ct stitch     Diagnostic Studies & Laboratory data: I have personally reviewed the following studies and agree with the findings     Recent Radiology Findings:       Recent Lab Findings: Lab Results  Component Value Date   WBC 7.4 03/09/2023   HGB 9.2 (L) 03/09/2023   HCT 28.2 (L) 03/09/2023   PLT 281 03/09/2023    GLUCOSE 230 (H) 03/15/2023   CHOL 130 05/21/2020   TRIG 81 05/21/2020   HDL 35 (L) 05/21/2020   LDLCALC 79 05/21/2020   ALT 6 03/07/2023   AST 19 03/07/2023   NA 131 (L) 03/15/2023   K 4.8 03/15/2023   CL 96 (L) 03/15/2023   CREATININE 1.07 (H) 03/15/2023   BUN 28 (H) 03/15/2023   CO2 24 03/15/2023   INR 1.5 (H) 03/02/2023   HGBA1C 5.6 02/25/2023      Assessment / Plan:     SP AVR CABG. Overall doing ok with perhaps some allergies causing her breathing to be worse. She was advised to stop her amiodarone and to discuss her allergies with her PCP. She is scheduled for her echo end of the month. We will work on getting her into Cardiac rehab. She is very well pleased with her care   I have spent 20 min in review of the records, viewing studies and in face to face with patient and in coordination of future care    Eugenio Hoes 04/10/2023 7:59 PM

## 2023-04-11 ENCOUNTER — Ambulatory Visit
Admission: RE | Admit: 2023-04-11 | Discharge: 2023-04-11 | Disposition: A | Discharge status: Home / Self Care | Payer: Medicare HMO | Source: Ambulatory Visit | Attending: Thoracic Surgery (Cardiothoracic Vascular Surgery) | Admitting: Thoracic Surgery (Cardiothoracic Vascular Surgery)

## 2023-04-11 ENCOUNTER — Encounter: Payer: Self-pay | Admitting: Thoracic Surgery (Cardiothoracic Vascular Surgery)

## 2023-04-11 ENCOUNTER — Other Ambulatory Visit: Payer: Self-pay | Admitting: *Deleted

## 2023-04-11 ENCOUNTER — Ambulatory Visit (INDEPENDENT_AMBULATORY_CARE_PROVIDER_SITE_OTHER): Payer: Medicare HMO | Admitting: Thoracic Surgery (Cardiothoracic Vascular Surgery)

## 2023-04-11 VITALS — BP 121/66 | HR 72 | Resp 20 | Wt 192.0 lb

## 2023-04-11 DIAGNOSIS — Z952 Presence of prosthetic heart valve: Secondary | ICD-10-CM

## 2023-04-11 DIAGNOSIS — Z09 Encounter for follow-up examination after completed treatment for conditions other than malignant neoplasm: Secondary | ICD-10-CM

## 2023-04-11 NOTE — Patient Instructions (Signed)
Follow up prn Cardiac Rehab

## 2023-04-11 NOTE — Progress Notes (Signed)
Ambulatory referral placed to [facility] per Dr. Leafy Ro.

## 2023-04-15 ENCOUNTER — Ambulatory Visit
Admission: RE | Admit: 2023-04-15 | Discharge: 2023-04-15 | Disposition: A | Discharge status: Home / Self Care | Payer: Medicare HMO | Source: Ambulatory Visit | Attending: Medical | Admitting: Medical

## 2023-04-15 ENCOUNTER — Inpatient Hospital Stay: Admit: 2023-04-15 | Discharge: 2023-04-15 | Disposition: A | Discharge status: Home / Self Care | Payer: Medicare HMO

## 2023-04-15 DIAGNOSIS — Z952 Presence of prosthetic heart valve: Secondary | ICD-10-CM | POA: Diagnosis present

## 2023-04-15 DIAGNOSIS — I10 Essential (primary) hypertension: Secondary | ICD-10-CM | POA: Diagnosis not present

## 2023-04-15 DIAGNOSIS — E785 Hyperlipidemia, unspecified: Secondary | ICD-10-CM | POA: Insufficient documentation

## 2023-04-15 DIAGNOSIS — I3481 Nonrheumatic mitral (valve) annulus calcification: Secondary | ICD-10-CM | POA: Diagnosis not present

## 2023-04-15 LAB — ECHOCARDIOGRAM COMPLETE
AR max vel: 1.61 cm2
AV Area VTI: 2.06 cm2
AV Area mean vel: 1.71 cm2
AV Mean grad: 4.3 mmHg
AV Peak grad: 8.6 mmHg
Ao pk vel: 1.46 m/s
Area-P 1/2: 2.88 cm2
MV VTI: 1.79 cm2
S' Lateral: 3.1 cm

## 2023-04-15 NOTE — Progress Notes (Signed)
*  PRELIMINARY RESULTS* Echocardiogram 2D Echocardiogram has been performed.  Cristela Blue 04/15/2023, 9:35 AM

## 2023-04-18 ENCOUNTER — Other Ambulatory Visit
Admission: RE | Admit: 2023-04-18 | Discharge: 2023-04-18 | Disposition: A | Discharge status: Home / Self Care | Payer: Medicare HMO | Source: Ambulatory Visit | Attending: Medical | Admitting: Medical

## 2023-04-18 ENCOUNTER — Encounter: Payer: Self-pay | Admitting: Medical

## 2023-04-18 ENCOUNTER — Ambulatory Visit: Payer: Medicare HMO | Attending: Nurse Practitioner | Admitting: Medical

## 2023-04-18 VITALS — BP 148/70 | HR 66 | Ht 63.0 in | Wt 193.2 lb

## 2023-04-18 DIAGNOSIS — I25118 Atherosclerotic heart disease of native coronary artery with other forms of angina pectoris: Secondary | ICD-10-CM | POA: Diagnosis not present

## 2023-04-18 DIAGNOSIS — I10 Essential (primary) hypertension: Secondary | ICD-10-CM

## 2023-04-18 DIAGNOSIS — E782 Mixed hyperlipidemia: Secondary | ICD-10-CM | POA: Insufficient documentation

## 2023-04-18 DIAGNOSIS — Z952 Presence of prosthetic heart valve: Secondary | ICD-10-CM

## 2023-04-18 LAB — LDL CHOLESTEROL, DIRECT: Direct LDL: 133 mg/dL — ABNORMAL HIGH (ref 0–99)

## 2023-04-18 LAB — LIPID PANEL
Cholesterol: 188 mg/dL (ref 0–200)
HDL: 45 mg/dL (ref >40)
LDL Cholesterol: 118 mg/dL — ABNORMAL HIGH (ref 0–99)
Total CHOL/HDL Ratio: 4.2 RATIO
Triglycerides: 127 mg/dL (ref <150)
VLDL: 25 mg/dL (ref 0–40)

## 2023-04-18 MED ORDER — NITROGLYCERIN 0.4 MG SL SUBL: 0.4000 mg | SUBLINGUAL_TABLET | SUBLINGUAL | 3 refills | Status: AC | PRN

## 2023-04-18 MED ORDER — LOSARTAN POTASSIUM 100 MG PO TABS: 100.0000 mg | ORAL_TABLET | Freq: Every day | ORAL | 3 refills | Status: AC

## 2023-04-18 NOTE — Patient Instructions (Signed)
Medication Instructions:  Your physician recommends that you continue on your current medications as directed. Please refer to the Current Medication list given to you today.  *If you need a refill on your cardiac medications before your next appointment, please call your pharmacy*   Lab Work: Your physician recommends that you return for lab work prior to follow-up appointment: Lipid panel & Direct LDL Medical Mall Entrance at [facility] 1st desk on the right to check in (REGISTRATION)  Lab hours: Monday- Friday (7:30 am- 5:30 pm)  If you have labs (blood work) drawn today and your tests are completely normal, you will receive your results only by: MyChart Message (if you have MyChart) OR A paper copy in the mail If you have any lab test that is abnormal or we need to change your treatment, we will call you to review the results.   Testing/Procedures: -None ordered   Follow-Up: At [facility], you and your health needs are our priority.  As part of our continuing mission to provide you with exceptional heart care, we have created designated Provider Care Teams.  These Care Teams include your primary Cardiologist (physician) and Advanced Practice Providers (APPs -  Physician Assistants and Nurse Practitioners) who all work together to provide you with the care you need, when you need it.  We recommend signing up for the patient portal called "MyChart".  Sign up information is provided on this After Visit Summary.  MyChart is used to connect with patients for Virtual Visits (Telemedicine).  Patients are able to view lab/test results, encounter notes, upcoming appointments, etc.  Non-urgent messages can be sent to your provider as well.   To learn more about what you can do with MyChart, go to [email or web address].    Your next appointment:   1 month(s)  Provider:   You may see Julien Nordmann, MD or one of the following Advanced Practice Providers on your designated Care Team:    Nicolasa Ducking, NP Eula Listen, PA-C Cadence Fransico Michael, PA-C Charlsie Quest, NP    Other Instructions -None

## 2023-04-18 NOTE — Progress Notes (Signed)
Cardiology Office Note:    Date:  04/18/2023   ID:  Joyce Potter, DOB October 13, 1946, MRN [id number]  PCP:  [name], MD  [facility] Cardiologist:  Julien Nordmann, MD  [facility] Electrophysiologist:  None   Referring MD: Leanord Asal, Cy Blamer *   Chief Complaint: Hospital follow-up  History of Present Illness:    Joyce Potter is a 77 y.o. female with a hx of CAD s/p remote PCI with recent CABG x 2 with post-op afib, severe AS s/p recent SAVR (with CABG), TIA s/p let CEA in 2016, DM2, HTN, HLD, and iron deficiency anemia who presents for hospital follow-up.  She was previously followed by [facility] cardiology with remote cardiac catheterizations performed by them as outlined in CV studies.  Echo in March 2023 showed an EF greater than 55%, mild LVH, normal RV function, mild AI, moderate AAS.  Underwent left heart cath in July 2023 by Dr. Lennette Bihari, which showed proximal LAD 55% stenosis, sequential 75 and 80% stenosis in the first diagonal, proximal RCA 30% stenosis.  EF 55 to 60%.  Medical management was recommended.  She established with Dr. Mariah Milling in January 2024 noting exertional dyspnea and chest tightness.  Given symptoms she underwent echo in January 2024 showing EF of 60 to 65% no regional wall motion abnormalities, grade 1 diastolic dysfunction, normal RV systolic function and ventricular cavity size, trivial mitral regurgitation, calcified aortic valve with severe stenosis with a mean gradient of 40.8 mmHg and a valve area of 0.79 cm.  She was referred to the structural heart clinic.  Right and left heart cath in January 2024 showed proximal LAD 55% stenosis, sequential 95 to 90% stenosis in the D1, patent proximal RCA stent with minimal in-stent restenosis of 20% stenosis.  It was recommended the patient undergo multidisciplinary review regarding SAVR with CABG versus TAVR and bifurcation stenting.  Patient saw CVTS 02/21/2023 and it was felt was best served surgical  revascularization and SAVR.  Patient was brought to the operating room on 03/02/2023 and underwent CABG utilizing LIMA to LAD and R SVG to tag as well as an dose, being greater saphenous vein harvest of the right thigh.  She underwent aortic valve replacement utilizing 23 mm Inspiris Resilia valve.  She tolerated the procedure well without complication.  Hospitalization was complicated with AKI, right-sided pleural effusion, hypotension requiring midodrine, post-op afib briefly on amiodarone, volume overload requiring diuresis, and anemia requiring 2 unit PRBCs. She went to a nursing home and was discharged home 2 weeks later.   Today the patient is overall doing well.  She has had 2 mechanical falls since discharge.  She fell on her knee knees with the most recent fall and has residual knee soreness.  She also has some right leg soreness where vein graft was taken out.  She feels right leg is still mildly weak.  She denies dizziness or lightheadedness. No chest pain.  She has occasional SOB but this is mostly due to asthma.  BP mildly elevated today, she reports BP at home can be high.  Reports she is still taking Imdur, although discharge instructions say to stop Imdur.  Amiodarone was stopped by cardiothoracic surgery at the last appointment.  Cardiothoracic surgery referred to cardiac rehab.  Past Medical History:  Diagnosis Date   ADHD (attention deficit hyperactivity disorder)    Anemia    iron deficiency   Anxiety    Aortic stenosis    Arthritis    Asthma  Basal cell carcinoma    CAD (coronary artery disease)    s/p Left circumflex stent in 2012   Carotid stenosis    Cataract    Heart murmur    High cholesterol    HTN (hypertension)    Hyperlipidemia    IDDM (insulin dependent diabetes mellitus)    Multiple thyroid nodules    Benign   Neuromuscular disorder    Peptic ulcer disease    Pneumonia 1952   PONV (postoperative nausea and vomiting)    Retinal artery occlusion    on  left   Sleep apnea    Stroke 2018   ischemic    Past Surgical History:  Procedure Laterality Date   ABDOMINAL HYSTERECTOMY     AORTIC VALVE REPLACEMENT N/A 03/02/2023   Procedure: AORTIC VALVE REPLACEMENT (AVR) USING EDWARDS INSPIRIS RESILIA 23 MM AORTIC VALVE;  Surgeon: Eugenio Hoes, MD;  Location: [facility] OR;  Service: Open Heart Surgery;  Laterality: N/A;   ARTERY BIOPSY Left 11/19/2015   Procedure: BIOPSY TEMPORAL ARTERY;  Surgeon: Annice Needy, MD;  Location: [facility] ORS;  Service: Vascular;  Laterality: Left;   BREAST BIOPSY Left 2002   core- neg   BREAST EXCISIONAL BIOPSY     ??? not visible scar on neither breast- pt believes it to be right breast    CARDIAC CATHETERIZATION N/A 08/08/2015   Procedure: Right and Left Heart Cath and Coronary Angiography;  Surgeon: Dalia Heading, MD;  Location: [facility] INVASIVE CV LAB;  Service: Cardiovascular;  Laterality: N/A;   CARDIAC CATHETERIZATION Bilateral 09/03/2016   Procedure: Right/Left Heart Cath and Coronary Angiography;  Surgeon: Dalia Heading, MD;  Location: [facility] INVASIVE CV LAB;  Service: Cardiovascular;  Laterality: Bilateral;   CATARACT EXTRACTION W/ INTRAOCULAR LENS  IMPLANT, BILATERAL     COLONOSCOPY     in 2013- internal hemorrhoids   COLONOSCOPY WITH PROPOFOL N/A 07/07/2018   Procedure: COLONOSCOPY WITH PROPOFOL;  Surgeon: Scot Jun, MD;  Location: [facility] ENDOSCOPY;  Service: Endoscopy;  Laterality: N/A;   COLONOSCOPY WITH PROPOFOL N/A 05/22/2020   Procedure: COLONOSCOPY WITH PROPOFOL;  Surgeon: Wyline Mood, MD;  Location: [facility] ENDOSCOPY;  Service: Gastroenterology;  Laterality: N/A;   CORONARY ANGIOGRAPHY N/A 09/14/2017   Procedure: CORONARY ANGIOGRAPHY;  Surgeon: Dalia Heading, MD;  Location: [facility] INVASIVE CV LAB;  Service: Cardiovascular;  Laterality: N/A;   CORONARY ANGIOPLASTY     CORONARY ARTERY BYPASS GRAFT N/A 03/02/2023   Procedure: CORONARY ARTERY BYPASS GRAFTING (CABG) TIMES THREE USING THE LEFT INTERNAL MAMMARY ARTERY  (LIMA) AND ENDOSCOPICALLY HARVESTED RIGHT GREATER SAPHENOUS VEIN;  Surgeon: Eugenio Hoes, MD;  Location: [facility] OR;  Service: Open Heart Surgery;  Laterality: N/A;   CORONARY STENT INTERVENTION N/A 05/15/2020   Procedure: coronary angiography;  Surgeon: Dalia Heading, MD;  Location: [facility] INVASIVE CV LAB;  Service: Cardiovascular;  Laterality: N/A;   CORONARY STENT INTERVENTION N/A 05/15/2020   Procedure: CORONARY STENT INTERVENTION;  Surgeon: Marcina Millard, MD;  Location: [facility] INVASIVE CV LAB;  Service: Cardiovascular;  Laterality: N/A;   CORONARY STENT PLACEMENT     ENDARTERECTOMY Left 10/01/2015   Procedure: ENDARTERECTOMY CAROTID;  Surgeon: Annice Needy, MD;  Location: [facility] ORS;  Service: Vascular;  Laterality: Left;   ENTEROSCOPY N/A 07/24/2020   Procedure: ENTEROSCOPY;  Surgeon: Pasty Spillers, MD;  Location: [facility] ENDOSCOPY;  Service: Endoscopy;  Laterality: N/A;   ESOPHAGOGASTRODUODENOSCOPY     ESOPHAGOGASTRODUODENOSCOPY (EGD) WITH PROPOFOL N/A 05/31/2016   Procedure: ESOPHAGOGASTRODUODENOSCOPY (EGD) WITH PROPOFOL;  Surgeon:  Scot Jun, MD;  Location: [facility] ENDOSCOPY;  Service: Endoscopy;  Laterality: N/A;   ESOPHAGOGASTRODUODENOSCOPY (EGD) WITH PROPOFOL N/A 07/07/2018   Procedure: ESOPHAGOGASTRODUODENOSCOPY (EGD) WITH PROPOFOL;  Surgeon: Scot Jun, MD;  Location: [facility] ENDOSCOPY;  Service: Endoscopy;  Laterality: N/A;   ESOPHAGOGASTRODUODENOSCOPY (EGD) WITH PROPOFOL N/A 07/17/2018   Procedure: ESOPHAGOGASTRODUODENOSCOPY (EGD) WITH PROPOFOL;  Surgeon: Midge Minium, MD;  Location: [facility] ENDOSCOPY;  Service: Endoscopy;  Laterality: N/A;   ESOPHAGOGASTRODUODENOSCOPY (EGD) WITH PROPOFOL N/A 05/21/2020   Procedure: ESOPHAGOGASTRODUODENOSCOPY (EGD) WITH PROPOFOL;  Surgeon: Wyline Mood, MD;  Location: [facility] ENDOSCOPY;  Service: Gastroenterology;  Laterality: N/A;   ESOPHAGOGASTRODUODENOSCOPY (EGD) WITH PROPOFOL N/A 06/13/2020   Procedure: ESOPHAGOGASTRODUODENOSCOPY (EGD) WITH PROPOFOL;   Surgeon: Midge Minium, MD;  Location: [facility] ENDOSCOPY;  Service: Endoscopy;  Laterality: N/A;   EYE SURGERY     GIVENS CAPSULE STUDY N/A 04/17/2013   Procedure: GIVENS CAPSULE STUDY;  Surgeon: Willis Modena, MD;  Location: [facility] ENDOSCOPY;  Service: Endoscopy;  Laterality: N/A;  patient ate breakfast at 7am    GIVENS CAPSULE STUDY N/A 06/11/2020   Procedure: GIVENS CAPSULE STUDY;  Surgeon: Pasty Spillers, MD;  Location: [facility] ENDOSCOPY;  Service: Endoscopy;  Laterality: N/A;   GIVENS CAPSULE STUDY N/A 01/29/2021   Procedure: GIVENS CAPSULE STUDY;  Surgeon: Pasty Spillers, MD;  Location: [facility] ENDOSCOPY;  Service: Endoscopy;  Laterality: N/A;   GIVENS CAPSULE STUDY N/A 08/24/2021   Procedure: GIVENS CAPSULE STUDY;  Surgeon: Pasty Spillers, MD;  Location: [facility] ENDOSCOPY;  Service: Endoscopy;  Laterality: N/A;   HEMORRHOID BANDING  07/27/2016   Dr Malissa Hippo   LEFT HEART CATH AND CORONARY ANGIOGRAPHY N/A 07/08/2022   Procedure: LEFT HEART CATH AND CORONARY ANGIOGRAPHY;  Surgeon: Laurier Nancy, MD;  Location: [facility] INVASIVE CV LAB;  Service: Cardiovascular;  Laterality: N/A;   PARTIAL HYSTERECTOMY     RIGHT AND LEFT HEART CATH Bilateral 09/14/2017   Procedure: RIGHT AND LEFT HEART CATH;  Surgeon: Dalia Heading, MD;  Location: [facility] INVASIVE CV LAB;  Service: Cardiovascular;  Laterality: Bilateral;   RIGHT HEART CATH AND CORONARY ANGIOGRAPHY N/A 01/26/2023   Procedure: RIGHT HEART CATH AND CORONARY ANGIOGRAPHY;  Surgeon: Orbie Pyo, MD;  Location: [facility] INVASIVE CV LAB;  Service: Cardiovascular;  Laterality: N/A;   RIGHT/LEFT HEART CATH AND CORONARY ANGIOGRAPHY Right 05/15/2020   Procedure: Right/Left Heart cath;  Surgeon: Dalia Heading, MD;  Location: [facility] INVASIVE CV LAB;  Service: Cardiovascular;  Laterality: Right;   TEE WITHOUT CARDIOVERSION N/A 03/02/2023   Procedure: TRANSESOPHAGEAL ECHOCARDIOGRAM;  Surgeon: Eugenio Hoes, MD;  Location: [facility] OR;  Service: Open Heart Surgery;   Laterality: N/A;   TRIGGER FINGER RELEASE      Current Medications: Current Meds  Medication Sig   albuterol (VENTOLIN HFA) 108 (90 Base) MCG/ACT inhaler Inhale 2 puffs into the lungs every 4 (four) hours as needed for wheezing or shortness of breath.    amitriptyline (ELAVIL) 25 MG tablet Take 25 mg by mouth at bedtime as needed for sleep.    aspirin EC 325 MG tablet Take 1 tablet (325 mg total) by mouth daily.   BD INSULIN SYRINGE U/F 31G X 5/16" 1 ML MISC    doxycycline (VIBRA-TABS) 100 MG tablet Take 1 tablet (100 mg total) by mouth every 12 (twelve) hours.   Dulaglutide 3 MG/0.5ML SOPN Inject 3 mg into the skin every Saturday.   insulin NPH-regular Human (NOVOLIN 70/30) (70-30) 100 UNIT/ML injection Inject 30 Units into the skin with breakfast,  with lunch, and with evening meal.   lactobacillus acidophilus (BACID) TABS tablet Take 2 tablets by mouth 3 (three) times daily.   metoprolol succinate (TOPROL-XL) 100 MG 24 hr tablet Take 1 tablet (100 mg total) by mouth daily. Take with or immediately following a meal.   Multiple Vitamin (MULTI-VITAMIN) tablet Take 1 tablet by mouth daily.   nitroGLYCERIN (NITROSTAT) 0.4 MG SL tablet Place 1 tablet (0.4 mg total) under the tongue every 5 (five) minutes as needed for chest pain.   ONETOUCH VERIO test strip    pantoprazole (PROTONIX) 40 MG tablet Take 40 mg by mouth daily.   sertraline (ZOLOFT) 25 MG tablet Take 1 tablet (25 mg total) by mouth daily.   SPIRIVA HANDIHALER 18 MCG inhalation capsule Place 18 mcg into inhaler and inhale at bedtime.   [DISCONTINUED] losartan (COZAAR) 100 MG tablet Take 1 tablet (100 mg total) by mouth daily.     Allergies:   Sulfa antibiotics, Invokamet [canagliflozin-metformin hcl], Lovastatin, Penicillins, and Vicodin [hydrocodone-acetaminophen]   Social History   Socioeconomic History   Marital status: Married    Spouse name: Jonny Ruiz   Number of children: 5   Years of education: Not on file   Highest  education level: Not on file  Occupational History   Occupation: retired   Tobacco Use   Smoking status: Former    Types: Cigarettes    Quit date: 02/15/1985    Years since quitting: 38.1   Smokeless tobacco: Never   Tobacco comments:    quit about 31 years ago  Vaping Use   Vaping Use: Never used  Substance and Sexual Activity   Alcohol use: No    Alcohol/week: 0.0 standard drinks of alcohol   Drug use: No   Sexual activity: Not on file  Other Topics Concern   Not on file  Social History Narrative   Lives at home independently.   Organ donor   Had 5 children- son timothy   Son died in 43   Social Determinants of Health   Financial Resource Strain: Low Risk  (05/31/2022)   Overall Financial Resource Strain (CARDIA)    Difficulty of Paying Living Expenses: Not hard at all  Food Insecurity: No Food Insecurity (03/14/2023)   Hunger Vital Sign    Worried About Running Out of Food in the Last Year: Never true    Ran Out of Food in the Last Year: Never true  Transportation Needs: No Transportation Needs (03/14/2023)   PRAPARE - Administrator, Civil Service (Medical): No    Lack of Transportation (Non-Medical): No  Physical Activity: Not on file  Stress: No Stress Concern Present (02/18/2022)   [employer] of Occupational Health - Occupational Stress Questionnaire    Feeling of Stress : Only a little  Social Connections: Socially Integrated (12/16/2021)   Social Connection and Isolation Panel [NHANES]    Frequency of Communication with Friends and Family: More than three times a week    Frequency of Social Gatherings with Friends and Family: More than three times a week    Attends Religious Services: More than 4 times per year    Active Member of [employer] or Organizations: Yes    Attends Engineer, structural: More than 4 times per year    Marital Status: Married     Family History: The patient's family history includes Breast cancer (age of onset:  61) in her mother; COPD in her father; Diabetes in her father; Seizures in her father;  Stroke in her father; Uterine cancer in her mother. There is no history of Colon cancer.  ROS:   Please see the history of present illness.     All other systems reviewed and are negative.  EKGs/Labs/Other Studies Reviewed:    The following studies were reviewed today:  Echo 04/15/23 1. Left ventricular ejection fraction, by estimation, is 60 to 65%. The  left ventricle has normal function. The left ventricle has no regional  wall motion abnormalities. Left ventricular diastolic parameters are  consistent with Grade I diastolic  dysfunction (impaired relaxation).   2. Right ventricular systolic function is normal. The right ventricular  size is normal.   3. Left atrial size was moderately dilated.   4. The mitral valve is normal in structure. No evidence of mitral valve  regurgitation. No evidence of mitral stenosis. Moderate mitral annular  calcification.   5. The aortic valve has been repaired/replaced. Aortic valve  regurgitation is not visualized. No aortic stenosis is present. Aortic  valve area, by VTI measures 2.06 cm. Aortic valve mean gradient measures  4.3 mmHg.   6. The inferior vena cava is normal in size with greater than 50%  respiratory variability, suggesting right atrial pressure of 3 mmHg.   Cardiac cath 12/2022   Prox LAD lesion is 55% stenosed.   Prox RCA lesion is 20% stenosed.   1st Diag-1 lesion is 95% stenosed.   1st Diag-2 lesion is 90% stenosed.   1.  High-grade ostial and mid body diagonal disease with RFR positive proximal LAD disease (0.86). 2.  Patent proximal right coronary artery stent with minimal in-stent restenosis.   Recommendation: Will conduct multidisciplinary review regarding surgical aortic valve replacement with CABG versus TAVR and bifurcation stenting.     EKG:  EKG is ordered today.  The ekg ordered today demonstrates NSR 66bpm, septal q waves,  nonspecific T wave changes  Recent Labs: 03/07/2023: ALT 6 03/08/2023: Magnesium 1.9 03/09/2023: Hemoglobin 9.2; Platelets 281 03/15/2023: BUN 28; Creatinine, Ser 1.07; Potassium 4.8; Sodium 131  Recent Lipid Panel    Component Value Date/Time   CHOL 188 04/18/2023 1209   TRIG 127 04/18/2023 1209   HDL 45 04/18/2023 1209   CHOLHDL 4.2 04/18/2023 1209   VLDL 25 04/18/2023 1209   LDLCALC 118 (H) 04/18/2023 1209    Physical Exam:    VS:  BP (!) 148/70 (BP Location: Left Arm, Patient Position: Sitting, Cuff Size: Normal)   Pulse 66   Ht  (1.6 m)   Wt 193 lb 4 oz (87.7 kg)   SpO2 98%   BMI 34.23 kg/m     Wt Readings from Last 3 Encounters:  04/18/23 193 lb 4 oz (87.7 kg)  04/11/23 192 lb (87.1 kg)  03/23/23 199 lb (90.3 kg)     GEN:  Well nourished, well developed in no acute distress HEENT: Normal NECK: No JVD; No carotid bruits LYMPHATICS: No lymphadenopathy CARDIAC: RRR, + murmur, no rubs, gallops RESPIRATORY:  Clear to auscultation without rales, wheezing or rhonchi  ABDOMEN: Soft, non-tender, non-distended MUSCULOSKELETAL:  No edema; No deformity  SKIN: Warm and dry NEUROLOGIC:  Alert and oriented x 3 PSYCHIATRIC:  Normal affect   ASSESSMENT:    1. Coronary artery disease of native artery of native heart with stable angina pectoris   2. S/P aortic valve replacement   3. Mixed hyperlipidemia   4. Primary hypertension    PLAN:    In order of problems listed above:  CAD s/p CABG  x 2 with LIMA to LAD and RSVG from the aorta to the diagonal coronary artery History of prior stenting Patient underwent CABG x 2 with SAVR on 03/02/2023. Patient is overall recovering well.  She has seen cardiothoracic surgery since twice since discharge.  Amiodarone was stopped at the most recent visit, and she was referred to cardiac rehab. Patient denies any significant chest pain which she has occasional shortness of breath, but also reports asthma. Continue ASA 325mg  daily and  Toprol. She does not tolerate statins.   Severe aortic stenosis s/p aortic valve replacement Repeat echo at 1 month showed LVEF 60 to 65%, grade 1 diastolic dysfunction, replaced aortic valve with a mean gradient 4.3 mmHg. Patient appears euvolemic on exam.   Hyperlipidemia LDL 110 in 2022.  I will update a lipid panel.  Patient has intolerance to statins.  She cannot afford Repatha.  May try Zetia pending lipid panel.  Hypertension Pressure is mildly elevated today.  She reports she has been taking losartan 25 mg daily although discharge instructions say 100 mg daily.  I will send in losartan 100 mg daily.  Continue Toprol 100 mg daily.  Patient reports that she is possible taking Imdur twice a day even though this was discontinued at discharge.  I recommend patient discontinue Imdur for now, can reevaluate need for this at follow-up.  Disposition: Follow up in 1 month(s) with MD/APP    Signed, Zali Kamaka David Stall, PA-C  04/18/2023 2:00 PM     [facility]

## 2023-04-20 ENCOUNTER — Other Ambulatory Visit: Payer: Self-pay

## 2023-04-20 DIAGNOSIS — E782 Mixed hyperlipidemia: Secondary | ICD-10-CM

## 2023-04-20 MED ORDER — EZETIMIBE 10 MG PO TABS: 10.0000 mg | ORAL_TABLET | Freq: Every day | ORAL | 3 refills | Status: DC

## 2023-04-25 ENCOUNTER — Other Ambulatory Visit: Payer: Self-pay | Admitting: Internal Medicine

## 2023-04-25 DIAGNOSIS — F419 Anxiety disorder, unspecified: Secondary | ICD-10-CM

## 2023-04-27 ENCOUNTER — Other Ambulatory Visit: Payer: Self-pay | Admitting: Family Medicine

## 2023-04-27 DIAGNOSIS — Z1231 Encounter for screening mammogram for malignant neoplasm of breast: Secondary | ICD-10-CM

## 2023-04-28 ENCOUNTER — Other Ambulatory Visit: Payer: Self-pay | Admitting: Family Medicine

## 2023-04-28 ENCOUNTER — Ambulatory Visit
Admission: RE | Admit: 2023-04-28 | Discharge: 2023-04-28 | Disposition: A | Discharge status: Home / Self Care | Payer: Medicare HMO | Source: Ambulatory Visit | Attending: Family Medicine | Admitting: Family Medicine

## 2023-04-28 DIAGNOSIS — M79605 Pain in left leg: Secondary | ICD-10-CM

## 2023-04-28 DIAGNOSIS — M25511 Pain in right shoulder: Secondary | ICD-10-CM

## 2023-05-04 ENCOUNTER — Ambulatory Visit (INDEPENDENT_AMBULATORY_CARE_PROVIDER_SITE_OTHER): Payer: Medicare HMO

## 2023-05-04 ENCOUNTER — Ambulatory Visit: Payer: Medicare HMO

## 2023-05-04 ENCOUNTER — Ambulatory Visit
Admission: RE | Admit: 2023-05-04 | Discharge: 2023-05-04 | Disposition: A | Discharge status: Home / Self Care | Payer: Medicare HMO | Source: Ambulatory Visit | Attending: Family Medicine | Admitting: Family Medicine

## 2023-05-04 ENCOUNTER — Other Ambulatory Visit: Payer: Self-pay | Admitting: Family Medicine

## 2023-05-04 ENCOUNTER — Ambulatory Visit (INDEPENDENT_AMBULATORY_CARE_PROVIDER_SITE_OTHER): Payer: Medicare HMO | Admitting: Podiatry

## 2023-05-04 DIAGNOSIS — M25561 Pain in right knee: Secondary | ICD-10-CM

## 2023-05-04 DIAGNOSIS — S93601A Unspecified sprain of right foot, initial encounter: Secondary | ICD-10-CM | POA: Diagnosis not present

## 2023-05-04 DIAGNOSIS — M79671 Pain in right foot: Secondary | ICD-10-CM

## 2023-05-04 DIAGNOSIS — S93421A Sprain of deltoid ligament of right ankle, initial encounter: Secondary | ICD-10-CM

## 2023-05-04 DIAGNOSIS — S92352A Displaced fracture of fifth metatarsal bone, left foot, initial encounter for closed fracture: Secondary | ICD-10-CM | POA: Diagnosis not present

## 2023-05-04 NOTE — Progress Notes (Signed)
  Subjective:  Patient ID: Joyce Potter, female    DOB: [birth year],  MRN: [id number]  Chief Complaint  Patient presents with   Foot Pain    est - left foot is broken / right is swollen & painful - patient fell a few days ago -left foot xrays were done in ED    77 y.o. female presents with the above complaint. History confirmed with patient.  The fall happened at home.  She also injured her right knee and left shoulder.  Has orthopedic appointment for the knee.  PCP sent for x-rays but they only completed the left foot x-rays did not x-ray ankle or foot on right  Objective:  Physical Exam: warm, good capillary refill, no trophic changes or ulcerative lesions, normal DP and PT pulses, normal sensory exam, and pain and edema over dorsal lateral left forefoot over fifth metatarsal, no left ankle pain, on the right there is pain posterior to the medial malleolus along the deltoid ligament, no pain at the navicular tuberosity or fifth metatarsal base or lateral malleolus.   Radiographs: Multiple views x-ray of right foot and ankle were taken today, left foot radiographs from Assessment:   1. Closed fracture of fifth metatarsal bone of left foot, initial encounter   2. Sprain of right foot, initial encounter   3. Sprain of deltoid ligament of right ankle, initial encounter      Plan:  Patient was evaluated and treated and all questions answered.  Prior radiographs reviewed as well as new radiographs taken today.  She has a minimally displaced fracture of the left foot fifth metatarsal at the juncture of zone 2 and zone 3.  Additionally suspect she has a deltoid ligament ankle sprain, no sign of fracture on the right side.  We discussed operative and nonoperative treatment.  I recommended checking her vitamin D and calcium level to evaluate for bone healing and fracture risk.  I discussed with the patient and her daughter considering the minimal displacement, junctional position of the  fracture as well as her recent CABG and AVR 2 months ago surgical intervention and repair likely offers minimal increased benefit over the risks involved.  Recommended nonoperative treatment with weightbearing as tolerated in a cam walker boot to support the fracture healing.  Discussed resting is much as possible, do not ambulate outside the boot and ice and elevate.  Compression sleeve applied to the right ankle.  May consider PT if ankle sprain not improving.  Vitamin D and calcium level ordered.  Follow-up 1 month for new radiographs  Return in about 5 weeks (around 06/08/2023) for fracture follow up new xrays .

## 2023-05-05 LAB — VITAMIN D 25 HYDROXY (VIT D DEFICIENCY, FRACTURES): Vit D, 25-Hydroxy: 33.6 ng/mL (ref 30.0–100.0)

## 2023-05-05 LAB — CALCIUM: Calcium: 9.2 mg/dL (ref 8.7–10.3)

## 2023-05-19 ENCOUNTER — Ambulatory Visit: Payer: Medicare HMO | Attending: Medical | Admitting: Medical

## 2023-05-19 ENCOUNTER — Encounter: Payer: Self-pay | Admitting: Medical

## 2023-05-19 VITALS — BP 197/75 | HR 54 | Ht 63.0 in | Wt 192.0 lb

## 2023-05-19 DIAGNOSIS — I350 Nonrheumatic aortic (valve) stenosis: Secondary | ICD-10-CM | POA: Diagnosis not present

## 2023-05-19 DIAGNOSIS — E782 Mixed hyperlipidemia: Secondary | ICD-10-CM | POA: Diagnosis not present

## 2023-05-19 DIAGNOSIS — I10 Essential (primary) hypertension: Secondary | ICD-10-CM

## 2023-05-19 DIAGNOSIS — I25118 Atherosclerotic heart disease of native coronary artery with other forms of angina pectoris: Secondary | ICD-10-CM | POA: Diagnosis not present

## 2023-05-19 MED ORDER — ISOSORBIDE MONONITRATE ER 30 MG PO TB24: 30.0000 mg | ORAL_TABLET | Freq: Every day | ORAL | 3 refills | Status: DC

## 2023-05-19 NOTE — Progress Notes (Signed)
Cardiology Office Note:    Date:  05/19/2023   ID:  Joyce Potter, DOB 1946-04-29, MRN [id number]  PCP:  Leanord Asal, Nelva Bush, MD  [facility] Cardiologist:  Julien Nordmann, MD  [facility] Electrophysiologist:  None   Referring MD: [name], MD   Chief Complaint: 1 month follow-up  History of Present Illness:    Joyce Potter is a 77 y.o. female with a hx of  CAD s/p remote PCI with recent CABG x 2 with post-op afib, severe AS s/p recent SAVR (with CABG), TIA s/p let CEA in 2016, DM2, HTN, HLD, and iron deficiency anemia who presents for hospital follow-up.   She was previously followed by [facility] cardiology with remote cardiac catheterizations performed by them as outlined in CV studies.   Echo in March 2023 showed an EF greater than 55%, mild LVH, normal RV function, mild AI, moderate AAS.  Underwent left heart cath in July 2023 by Dr. Lennette Bihari, which showed proximal LAD 55% stenosis, sequential 75 and 80% stenosis in the first diagonal, proximal RCA 30% stenosis.  EF 55 to 60%.  Medical management was recommended.   She established with Dr. Mariah Milling in January 2024 noting exertional dyspnea and chest tightness.  Given symptoms she underwent echo in January 2024 showing EF of 60 to 65% no regional wall motion abnormalities, grade 1 diastolic dysfunction, normal RV systolic function and ventricular cavity size, trivial mitral regurgitation, calcified aortic valve with severe stenosis with a mean gradient of 40.8 mmHg and a valve area of 0.79 cm.  She was referred to the structural heart clinic.   Right and left heart cath in January 2024 showed proximal LAD 55% stenosis, sequential 95 to 90% stenosis in the D1, patent proximal RCA stent with minimal in-stent restenosis of 20% stenosis.  It was recommended the patient undergo multidisciplinary review regarding SAVR with CABG versus TAVR and bifurcation stenting.  Patient saw CVTS 02/21/2023 and it was felt was best served  surgical revascularization and SAVR.   Patient was brought to the operating room on 03/02/2023 and underwent CABG utilizing LIMA to LAD and R SVG to tag as well as an dose, being greater saphenous vein harvest of the right thigh.  She underwent aortic valve replacement utilizing 23 mm Inspiris Resilia valve.  She tolerated the procedure well without complication.  Hospitalization was complicated with AKI, right-sided pleural effusion, hypotension requiring midodrine, post-op afib briefly on amiodarone, volume overload requiring diuresis, and anemia requiring 2 unit PRBCs. She went to a nursing home and was discharged home 2 weeks later.   The patient was last seen in April 2024 and was overall doing well. She had 2 mechanical falls since discharge. No chest pain reported. Amiodarone had previously been stopped.  Imdur was discontinued and losartan 100 mg daily was sent in.  Today, BP is very high today. She fell and broke her left ankle and sprained her right knee. She got her cane hooked on the door of the church and fell. This was the third time she fell. Falls were all reportedly mechanical.  She has occasional dizziness when she stands up. She has some sharp brief chest pains, seems more atypical. She says it's hard to swallow, food and liquid.   Past Medical History:  Diagnosis Date   ADHD (attention deficit hyperactivity disorder)    Anemia    iron deficiency   Anxiety    Aortic stenosis    Arthritis    Asthma  Basal cell carcinoma    CAD (coronary artery disease)    s/p Left circumflex stent in 2012   Carotid stenosis    Cataract    Heart murmur    High cholesterol    HTN (hypertension)    Hyperlipidemia    IDDM (insulin dependent diabetes mellitus)    Multiple thyroid nodules    Benign   Neuromuscular disorder (HCC)    Peptic ulcer disease    Pneumonia 1952   PONV (postoperative nausea and vomiting)    Retinal artery occlusion    on left   Sleep apnea    Stroke (HCC) 2018    ischemic    Past Surgical History:  Procedure Laterality Date   ABDOMINAL HYSTERECTOMY     AORTIC VALVE REPLACEMENT N/A 03/02/2023   Procedure: AORTIC VALVE REPLACEMENT (AVR) USING EDWARDS INSPIRIS RESILIA 23 MM AORTIC VALVE;  Surgeon: Eugenio Hoes, MD;  [facility];  Service: Open Heart Surgery;  Laterality: N/A;   ARTERY BIOPSY Left 11/19/2015   Procedure: BIOPSY TEMPORAL ARTERY;  Surgeon: Annice Needy, MD;  Location: [facility] ORS;  Service: Vascular;  Laterality: Left;   BREAST BIOPSY Left 2002   core- neg   BREAST EXCISIONAL BIOPSY     ??? not visible scar on neither breast- pt believes it to be right breast    CARDIAC CATHETERIZATION N/A 08/08/2015   Procedure: Right and Left Heart Cath and Coronary Angiography;  Surgeon: Dalia Heading, MD;  Location: [facility] INVASIVE CV LAB;  Service: Cardiovascular;  Laterality: N/A;   CARDIAC CATHETERIZATION Bilateral 09/03/2016   Procedure: Right/Left Heart Cath and Coronary Angiography;  Surgeon: Dalia Heading, MD;  Location: [facility] INVASIVE CV LAB;  Service: Cardiovascular;  Laterality: Bilateral;   CATARACT EXTRACTION W/ INTRAOCULAR LENS  IMPLANT, BILATERAL     COLONOSCOPY     in 2013- internal hemorrhoids   COLONOSCOPY WITH PROPOFOL N/A 07/07/2018   Procedure: COLONOSCOPY WITH PROPOFOL;  Surgeon: Scot Jun, MD;  Location[facility];  Service: Endoscopy;  Laterality: N/A;   COLONOSCOPY WITH PROPOFOL N/A 05/22/2020   Procedure: COLONOSCOPY WITH PROPOFOL;  Surgeon: Wyline Mood, MD;  Location: [facility] ENDOSCOPY;  Service: Gastroenterology;  Laterality: N/A;   CORONARY ANGIOGRAPHY N/A 09/14/2017   Procedure: CORONARY ANGIOGRAPHY;  Surgeon: Dalia Heading, MD;  Location: [facility] INVASIVE CV LAB;  Service: Cardiovascular;  Laterality: N/A;   CORONARY ANGIOPLASTY     CORONARY ARTERY BYPASS GRAFT N/A 03/02/2023   Procedure: CORONARY ARTERY BYPASS GRAFTING (CABG) TIMES THREE USING THE LEFT INTERNAL MAMMARY ARTERY (LIMA) AND ENDOSCOPICALLY HARVESTED  RIGHT GREATER SAPHENOUS VEIN;  Surgeon: Eugenio Hoes, MD;  Location: [facility] OR;  Service: Open Heart Surgery;  Laterality: N/A;   CORONARY STENT INTERVENTION N/A 05/15/2020   Procedure: coronary angiography;  Surgeon: Dalia Heading, MD;  Location: [facility] INVASIVE CV LAB;  Service: Cardiovascular;  Laterality: N/A;   CORONARY STENT INTERVENTION N/A 05/15/2020   Procedure: CORONARY STENT INTERVENTION;  Surgeon: Marcina Millard, MD;  Location: [facility] INVASIVE CV LAB;  Service: Cardiovascular;  Laterality: N/A;   CORONARY STENT PLACEMENT     ENDARTERECTOMY Left 10/01/2015   Procedure: ENDARTERECTOMY CAROTID;  Surgeon: Annice Needy, MD;  Location: [facility] ORS;  Service: Vascular;  Laterality: Left;   ENTEROSCOPY N/A 07/24/2020   Procedure: ENTEROSCOPY;  Surgeon: Pasty Spillers, MD;  Location: [facility] ENDOSCOPY;  Service: Endoscopy;  Laterality: N/A;   ESOPHAGOGASTRODUODENOSCOPY     ESOPHAGOGASTRODUODENOSCOPY (EGD) WITH PROPOFOL N/A 05/31/2016   Procedure: ESOPHAGOGASTRODUODENOSCOPY (EGD) WITH PROPOFOL;  Surgeon: Scot Jun, MD;  Location: [facility] ENDOSCOPY;  Service: Endoscopy;  Laterality: N/A;   ESOPHAGOGASTRODUODENOSCOPY (EGD) WITH PROPOFOL N/A 07/07/2018   Procedure: ESOPHAGOGASTRODUODENOSCOPY (EGD) WITH PROPOFOL;  Surgeon: Scot Jun, MD;  Location: [facility] ENDOSCOPY;  Service: Endoscopy;  Laterality: N/A;   ESOPHAGOGASTRODUODENOSCOPY (EGD) WITH PROPOFOL N/A 07/17/2018   Procedure: ESOPHAGOGASTRODUODENOSCOPY (EGD) WITH PROPOFOL;  Surgeon: Midge Minium, MD;  Location: [facility] ENDOSCOPY;  Service: Endoscopy;  Laterality: N/A;   ESOPHAGOGASTRODUODENOSCOPY (EGD) WITH PROPOFOL N/A 05/21/2020   Procedure: ESOPHAGOGASTRODUODENOSCOPY (EGD) WITH PROPOFOL;  Surgeon: Wyline Mood, MD;  Location: [facility] ENDOSCOPY;  Service: Gastroenterology;  Laterality: N/A;   ESOPHAGOGASTRODUODENOSCOPY (EGD) WITH PROPOFOL N/A 06/13/2020   Procedure: ESOPHAGOGASTRODUODENOSCOPY (EGD) WITH PROPOFOL;  Surgeon: Midge Minium, MD;   Location: [facility] ENDOSCOPY;  Service: Endoscopy;  Laterality: N/A;   EYE SURGERY     GIVENS CAPSULE STUDY N/A 04/17/2013   Procedure: GIVENS CAPSULE STUDY;  Surgeon: Willis Modena, MD;  Location: [facility] ENDOSCOPY;  Service: Endoscopy;  Laterality: N/A;  patient ate breakfast at 7am    GIVENS CAPSULE STUDY N/A 06/11/2020   Procedure: GIVENS CAPSULE STUDY;  Surgeon: Pasty Spillers, MD;  Location: [facility] ENDOSCOPY;  Service: Endoscopy;  Laterality: N/A;   GIVENS CAPSULE STUDY N/A 01/29/2021   Procedure: GIVENS CAPSULE STUDY;  Surgeon: Pasty Spillers, MD;  Location: [facility] ENDOSCOPY;  Service: Endoscopy;  Laterality: N/A;   GIVENS CAPSULE STUDY N/A 08/24/2021   Procedure: GIVENS CAPSULE STUDY;  Surgeon: Pasty Spillers, MD;  Location: [facility] ENDOSCOPY;  Service: Endoscopy;  Laterality: N/A;   HEMORRHOID BANDING  07/27/2016   Dr Malissa Hippo   LEFT HEART CATH AND CORONARY ANGIOGRAPHY N/A 07/08/2022   Procedure: LEFT HEART CATH AND CORONARY ANGIOGRAPHY;  Surgeon: Laurier Nancy, MD;  Location: [facility] INVASIVE CV LAB;  Service: Cardiovascular;  Laterality: N/A;   PARTIAL HYSTERECTOMY     RIGHT AND LEFT HEART CATH Bilateral 09/14/2017   Procedure: RIGHT AND LEFT HEART CATH;  Surgeon: Dalia Heading, MD;  Location: [facility] INVASIVE CV LAB;  Service: Cardiovascular;  Laterality: Bilateral;   RIGHT HEART CATH AND CORONARY ANGIOGRAPHY N/A 01/26/2023   Procedure: RIGHT HEART CATH AND CORONARY ANGIOGRAPHY;  Surgeon: Orbie Pyo, MD;  [facility] INVAS[facility] CV LAB;  Service: Cardiovascular;  Laterality: N/A;   RIGHT/LEFT HEART CATH AND CORONARY ANGIOGRAPHY Right 05/15/2020   Procedure: Right/Left Heart cath;  Surgeon: Dalia Heading, MD;  Location: [facility] INVASIVE CV LAB;  Service: Cardiovascular;  Laterality: Right;   TEE WITHOUT CARDIOVERSION N/A 03/02/2023   Procedure: TRANSESOPHAGEAL ECHOCARDIOGRAM;  Surgeon: Eugenio Hoes, MD;  Location: [facility] OR;  Service: Open Heart Surgery;  Laterality: N/A;   TRIGGER FINGER  RELEASE      Current Medications: Current Meds  Medication Sig   albuterol (VENTOLIN HFA) 108 (90 Base) MCG/ACT inhaler Inhale 2 puffs into the lungs every 4 (four) hours as needed for wheezing or shortness of breath.    amiodarone (PACERONE) 200 MG tablet Take 200 mg by mouth daily.   amitriptyline (ELAVIL) 25 MG tablet Take 25 mg by mouth at bedtime as needed for sleep.    Ascorbic Acid (VITAMIN C) 1000 MG tablet Take 1,000 mg by mouth daily.   aspirin EC 325 MG tablet Take 1 tablet (325 mg total) by mouth daily.   BD INSULIN SYRINGE U/F 31G X 5/16" 1 ML MISC    Dulaglutide 3 MG/0.5ML SOPN Inject 3 mg into the skin every Saturday.   ezetimibe (ZETIA) 10 MG tablet Take 1  tablet (10 mg total) by mouth daily.   fexofenadine (ALLEGRA ALLERGY) 180 MG tablet Take 180 mg by mouth as needed for allergies or rhinitis.   insulin NPH-regular Human (NOVOLIN 70/30) (70-30) 100 UNIT/ML injection Inject 30 Units into the skin with breakfast, with lunch, and with evening meal.   isosorbide mononitrate (IMDUR) 30 MG 24 hr tablet Take 1 tablet (30 mg total) by mouth daily.   lactobacillus acidophilus (BACID) TABS tablet Take 2 tablets by mouth 3 (three) times daily.   losartan (COZAAR) 100 MG tablet Take 1 tablet (100 mg total) by mouth daily.   metoprolol succinate (TOPROL-XL) 100 MG 24 hr tablet Take 1 tablet (100 mg total) by mouth daily. Take with or immediately following a meal.   Multiple Vitamin (MULTI-VITAMIN) tablet Take 1 tablet by mouth daily.   naproxen sodium (ANAPROX) 550 MG tablet Take 550 mg by mouth 2 (two) times daily as needed.   nitroGLYCERIN (NITROSTAT) 0.4 MG SL tablet Place 1 tablet (0.4 mg total) under the tongue every 5 (five) minutes as needed for chest pain.   ONETOUCH VERIO test strip    pantoprazole (PROTONIX) 40 MG tablet Take 40 mg by mouth daily.   sertraline (ZOLOFT) 25 MG tablet Take 1 tablet by mouth once daily   SPIRIVA HANDIHALER 18 MCG inhalation capsule Place 18 mcg  into inhaler and inhale at bedtime.   UNABLE TO FIND Take 2,400 mg by mouth daily. Med Name: [place] Cinnamon + Berberine& Chromium   Zinc 50 MG TABS Take 50 mg by mouth daily.     Allergies:   Sulfa antibiotics, Invokamet [canagliflozin-metformin hcl], Lovastatin, Penicillins, and Vicodin [hydrocodone-acetaminophen]   Social History   Socioeconomic History   Marital status: Married    Spouse name: Jonny Ruiz   Number of children: 5   Years of education: Not on file   Highest education level: Not on file  Occupational History   Occupation: retired   Tobacco Use   Smoking status: Former    Types: Cigarettes    Quit date: 02/15/1985    Years since quitting: 38.2   Smokeless tobacco: Never   Tobacco comments:    quit about 31 years ago  Vaping Use   Vaping Use: Never used  Substance and Sexual Activity   Alcohol use: No    Alcohol/week: 0.0 standard drinks of alcohol   Drug use: No   Sexual activity: Not on file  Other Topics Concern   Not on file  Social History Narrative   Lives at home independently.   Organ donor   Had 5 children- son timothy   Son died in 45   Social Determinants of Health   Financial Resource Strain: Low Risk  (05/31/2022)   Overall Financial Resource Strain (CARDIA)    Difficulty of Paying Living Expenses: Not hard at all  Food Insecurity: No Food Insecurity (03/14/2023)   Hunger Vital Sign    Worried About Running Out of Food in the Last Year: Never true    Ran Out of Food in the Last Year: Never true  Transportation Needs: No Transportation Needs (03/14/2023)   PRAPARE - Administrator, Civil Service (Medical): No    Lack of Transportation (Non-Medical): No  Physical Activity: Not on file  Stress: No Stress Concern Present (02/18/2022)   [employer] of Occupational Health - Occupational Stress Questionnaire    Feeling of Stress : Only a little  Social Connections: Socially Integrated (12/16/2021)   Social Connection and Isolation  Panel [NHANES]    Frequency of Communication with Friends and Family: More than three times a week    Frequency of Social Gatherings with Friends and Family: More than three times a week    Attends Religious Services: More than 4 times per year    Active Member of [employer] or Organizations: Yes    Attends Engineer, structural: More than 4 times per year    Marital Status: Married     Family History: The patient's family history includes Breast cancer (age of onset: 66) in her mother; COPD in her father; Diabetes in her father; Seizures in her father; Stroke in her father; Uterine cancer in her mother. There is no history of Colon cancer.  ROS:   Please see the history of present illness.     All other systems reviewed and are negative.  EKGs/Labs/Other Studies Reviewed:    The following studies were reviewed today:  Echo 03/2023  1. Left ventricular ejection fraction, by estimation, is 60 to 65%. The  left ventricle has normal function. The left ventricle has no regional  wall motion abnormalities. Left ventricular diastolic parameters are  consistent with Grade I diastolic  dysfunction (impaired relaxation).   2. Right ventricular systolic function is normal. The right ventricular  size is normal.   3. Left atrial size was moderately dilated.   4. The mitral valve is normal in structure. No evidence of mitral valve  regurgitation. No evidence of mitral stenosis. Moderate mitral annular  calcification.   5. The aortic valve has been repaired/replaced. Aortic valve  regurgitation is not visualized. No aortic stenosis is present. Aortic  valve area, by VTI measures 2.06 cm. Aortic valve mean gradient measures  4.3 mmHg.   6. The inferior vena cava is normal in size with greater than 50%  respiratory variability, suggesting right atrial pressure of 3 mmHg.   EKG:  EKG is not ordered today.    Recent Labs: 03/07/2023: ALT 6 03/08/2023: Magnesium 1.9 03/09/2023: Hemoglobin  9.2; Platelets 281 03/15/2023: BUN 28; Creatinine, Ser 1.07; Potassium 4.8; Sodium 131  Recent Lipid Panel    Component Value Date/Time   CHOL 188 04/18/2023 1209   TRIG 127 04/18/2023 1209   HDL 45 04/18/2023 1209   CHOLHDL 4.2 04/18/2023 1209   VLDL 25 04/18/2023 1209   LDLCALC 118 (H) 04/18/2023 1209   LDLDIRECT 133 (H) 04/18/2023 1209     Physical Exam:    VS:  BP (!) 197/75 (BP Location: Left Arm, Patient Position: Sitting, Cuff Size: Normal)   Pulse (!) 54   Ht 5\' 3"  (1.6 m)   Wt 192 lb (87.1 kg)   SpO2 96%   BMI 34.01 kg/m     Wt Readings from Last 3 Encounters:  05/19/23 192 lb (87.1 kg)  04/18/23 193 lb 4 oz (87.7 kg)  04/11/23 192 lb (87.1 kg)     GEN:  Well nourished, well developed in no acute distress HEENT: Normal NECK: No JVD; No carotid bruits LYMPHATICS: No lymphadenopathy CARDIAC: RRR, + murmur, no rubs, gallops RESPIRATORY:  Clear to auscultation without rales, wheezing or rhonchi  ABDOMEN: Soft, non-tender, non-distended MUSCULOSKELETAL:  No edema; No deformity  SKIN: Warm and dry NEUROLOGIC:  Alert and oriented x 3 PSYCHIATRIC:  Normal affect   ASSESSMENT:    1. Coronary artery disease of native artery of native heart with stable angina pectoris (HCC)   2. Severe aortic stenosis   3. Hyperlipidemia, mixed   4. Essential hypertension  PLAN:    In order of problems listed above:  CAD s/p CABG x 2 with LIMA to LAD and RSVG from the aorta to the diagonal coronary artery History of prior stenting She underwent CABG x 2 with SAVR on 03/02/23 . She reports brief sharp chest pain, which is more atypical. No shortness of breath. She had a mechanical fall and broke her left foot, she is in a cast. She will do PT in a couple weeks. Continue ASA 325mg  daily and Toprol 100mg  daily. No further ischemic work-up indicated at this time  Severe aortic stenosis s/p aortic valve replacement  Repeat echo at 1 month showed LVEF 60-65%, G1DD, replaced aortic  valve with a mean gradient 4.18mmHg. Patient appears euvolemic.  Hyperlipidemia  LDL 110 in 2022. Patient has intolerance to statins. She cannot afford Repatha.   Hypertension BP is high today. She is taking losartan 100mg  daily and Toprol 100mg  daily. I will add back Imdur 30mg  daily. She will monitor orthostatic symptoms closely.  Disposition: Follow up in 1 month(s) with MD/APP    Signed, Earvin Blazier David Stall, PA-C  05/19/2023 3:20 PM    [facility]

## 2023-05-19 NOTE — Patient Instructions (Signed)
Medication Instructions:   START Imdur - Take one tablet ( 30mg ) by mouth daily.    *If you need a refill on your cardiac medications before your next appointment, please call your pharmacy*   Lab Work:  None Ordered  If you have labs (blood work) drawn today and your tests are completely normal, you will receive your results only by: MyChart Message (if you have MyChart) OR A paper copy in the mail If you have any lab test that is abnormal or we need to change your treatment, we will call you to review the results.   Testing/Procedures:  None Ordered  Follow-Up: At [facility], you and your health needs are our priority.  As part of our continuing mission to provide you with exceptional heart care, we have created designated Provider Care Teams.  These Care Teams include your primary Cardiologist (physician) and Advanced Practice Providers (APPs -  Physician Assistants and Nurse Practitioners) who all work together to provide you with the care you need, when you need it.  We recommend signing up for the patient portal called "MyChart".  Sign up information is provided on this After Visit Summary.  MyChart is used to connect with patients for Virtual Visits (Telemedicine).  Patients are able to view lab/test results, encounter notes, upcoming appointments, etc.  Non-urgent messages can be sent to your provider as well.   To learn more about what you can do with MyChart, go to [email or web address].    Your next appointment:   1 month(s)  Provider:   You may see Julien Nordmann, MD or one of the following Advanced Practice Providers on your designated Care Team:   Nicolasa Ducking, NP Eula Listen, PA-C Cadence Fransico Michael, PA-C Charlsie Quest, NP

## 2023-05-31 ENCOUNTER — Inpatient Hospital Stay: Payer: Medicare HMO

## 2023-05-31 ENCOUNTER — Inpatient Hospital Stay: Payer: Medicare HMO | Attending: Oncology | Admitting: Oncology

## 2023-05-31 ENCOUNTER — Encounter: Payer: Self-pay | Admitting: Oncology

## 2023-05-31 VITALS — BP 166/57 | HR 54 | Temp 98.3°F | Resp 17 | Wt 199.0 lb

## 2023-05-31 DIAGNOSIS — Z803 Family history of malignant neoplasm of breast: Secondary | ICD-10-CM | POA: Diagnosis not present

## 2023-05-31 DIAGNOSIS — R531 Weakness: Secondary | ICD-10-CM | POA: Insufficient documentation

## 2023-05-31 DIAGNOSIS — D509 Iron deficiency anemia, unspecified: Secondary | ICD-10-CM

## 2023-05-31 DIAGNOSIS — R5382 Chronic fatigue, unspecified: Secondary | ICD-10-CM | POA: Insufficient documentation

## 2023-05-31 DIAGNOSIS — I119 Hypertensive heart disease without heart failure: Secondary | ICD-10-CM | POA: Insufficient documentation

## 2023-05-31 DIAGNOSIS — Z808 Family history of malignant neoplasm of other organs or systems: Secondary | ICD-10-CM | POA: Diagnosis not present

## 2023-05-31 DIAGNOSIS — Z87891 Personal history of nicotine dependence: Secondary | ICD-10-CM | POA: Diagnosis not present

## 2023-05-31 DIAGNOSIS — Z79899 Other long term (current) drug therapy: Secondary | ICD-10-CM | POA: Insufficient documentation

## 2023-05-31 LAB — CBC (CANCER CENTER ONLY)
HCT: 34.7 % — ABNORMAL LOW (ref 36.0–46.0)
Hemoglobin: 11 g/dL — ABNORMAL LOW (ref 12.0–15.0)
MCH: 27.7 pg (ref 26.0–34.0)
MCHC: 31.7 g/dL (ref 30.0–36.0)
MCV: 87.4 fL (ref 80.0–100.0)
Platelet Count: 199 10*3/uL (ref 150–400)
RBC: 3.97 MIL/uL (ref 3.87–5.11)
RDW: 15.2 % (ref 11.5–15.5)
WBC Count: 5.8 10*3/uL (ref 4.0–10.5)
nRBC: 0 % (ref 0.0–0.2)

## 2023-05-31 LAB — IRON AND TIBC
Iron: 82 ug/dL (ref 28–170)
Saturation Ratios: 32 % — ABNORMAL HIGH (ref 10.4–31.8)
TIBC: 253 ug/dL (ref 250–450)
UIBC: 171 ug/dL

## 2023-05-31 LAB — FERRITIN: Ferritin: 129 ng/mL (ref 11–307)

## 2023-05-31 LAB — SAMPLE TO BLOOD BANK

## 2023-05-31 LAB — FOLATE: Folate: >40 ng/mL (ref >5.9)

## 2023-05-31 NOTE — Progress Notes (Signed)
[facility]  [phone]) [phone] [phone]  ID: Joyce Potter OB: Mar 24, 1946  MR#: [id number]  KGM#:[id number]  Patient Care Team: Leanord Asal, Nelva Bush, MD as PCP - General (Family Medicine) [name], MD as PCP - Cardiology (Cardiology) Jeralyn Ruths, MD as Consulting Physician (Hematology and Oncology) Tedd Sias Marlana Salvage, MD as Physician Assistant (Endocrinology) Galen Manila, MD as Referring Physician (Ophthalmology)  CHIEF COMPLAINT: Iron deficiency anemia, secondary to chronic blood loss.  INTERVAL HISTORY: Patient last seen in clinic greater than 1 year ago and was referred back for continued evaluation and treatment of her iron deficiency anemia.  She underwent CABG x 2 and valve replacement in March 2024.  Patient reports she required 2 units of packed red blood cells during that admission.  She continues to have chronic weakness and fatigue.  She also complains of dizziness, but no other neurologic complaints.  She has a good appetite and denies weight loss.  She denies any recent fevers or illnesses.  She denies chest pain, shortness of breath, cough or hemoptysis. She denies any nausea, vomiting, or diarrhea.  She has no melena or hematochezia.  She has no urinary complaints.  Patient offers no further specific complaints today.    REVIEW OF SYSTEMS:   Review of Systems  Constitutional:  Positive for malaise/fatigue. Negative for diaphoresis, fever and weight loss.  Eyes: Negative.  Negative for blurred vision.  Respiratory: Negative.  Negative for cough, hemoptysis, shortness of breath and wheezing.   Cardiovascular: Negative.  Negative for chest pain and leg swelling.  Gastrointestinal: Negative.  Negative for abdominal pain, blood in stool and melena.  Genitourinary: Negative.  Negative for flank pain and hematuria.  Musculoskeletal:  Negative for falls and joint pain.  Skin: Negative.  Negative for rash.  Neurological:   Positive for dizziness and weakness. Negative for focal weakness and headaches.  Psychiatric/Behavioral: Negative.  The patient is not nervous/anxious.     As per HPI. Otherwise, a complete review of systems is negative.  PAST MEDICAL HISTORY: Past Medical History:  Diagnosis Date   ADHD (attention deficit hyperactivity disorder)    Anemia    iron deficiency   Anxiety    Aortic stenosis    Arthritis    Asthma    Basal cell carcinoma    CAD (coronary artery disease)    s/p Left circumflex stent in 2012   Carotid stenosis    Cataract    Heart murmur    High cholesterol    HTN (hypertension)    Hyperlipidemia    IDDM (insulin dependent diabetes mellitus)    Multiple thyroid nodules    Benign   Neuromuscular disorder (HCC)    Peptic ulcer disease    Pneumonia 1952   PONV (postoperative nausea and vomiting)    Retinal artery occlusion    on left   Sleep apnea    Stroke (HCC) 2018   ischemic    PAST SURGICAL HISTORY: Past Surgical History:  Procedure Laterality Date   ABDOMINAL HYSTERECTOMY     AORTIC VALVE REPLACEMENT N/A 03/02/2023   Procedure: AORTIC VALVE REPLACEMENT (AVR) USING EDWARDS INSPIRIS RESILIA 23 MM AORTIC VALVE;  Surgeon: Eugenio Hoes, MD;  [facility]: [facility] OR;  Service: Open Heart Surgery;  Laterality: N/A;   ARTERY BIOPSY Left 11/19/2015   Procedure: BIOPSY TEMPORAL ARTERY;  Surgeon: Annice Needy, MD;  Location: [facility] ORS;  Service: Vascular;  Laterality: Left;   BREAST BIOPSY Left 2002   core- neg  BREAST EXCISIONAL BIOPSY     ??? not visible scar on neither breast- pt believes it to be right breast    CARDIAC CATHETERIZATION N/A 08/08/2015   Procedure: Right and Left Heart Cath and Coronary Angiography;  Surgeon: Dalia Heading, MD;  Location: [facility] INVASIVE CV LAB;  Service: Cardiovascular;  Laterality: N/A;   CARDIAC CATHETERIZATION Bilateral 09/03/2016   Procedure: Right/Left Heart Cath and Coronary Angiography;  Surgeon: Dalia Heading, MD;  Location: [facility]  INVASIVE CV LAB;  Service: Cardiovascular;  Laterality: Bilateral;   CATARACT EXTRACTION W/ INTRAOCULAR LENS  IMPLANT, BILATERAL     COLONOSCOPY     in 2013- internal hemorrhoids   COLONOSCOPY WITH PROPOFOL N/A 07/07/2018   Procedure: COLONOSCOPY WITH PROPOFOL;  Surgeon: Scot Jun, MD;  Location: [facility] ENDOSCOPY;  Service: Endoscopy;  Laterality: N/A;   COLONOSCOPY WITH PROPOFOL N/A 05/22/2020   Procedure: COLONOSCOPY WITH PROPOFOL;  Surgeon: Wyline Mood, MD;  Location: [facility] ENDOSCOPY;  Service: Gastroenterology;  Laterality: N/A;   CORONARY ANGIOGRAPHY N/A 09/14/2017   Procedure: CORONARY ANGIOGRAPHY;  Surgeon: Dalia Heading, MD;  Location: [facility] INVASIVE CV LAB;  Service: Cardiovascular;  Laterality: N/A;   CORONARY ANGIOPLASTY     CORONARY ARTERY BYPASS GRAFT N/A 03/02/2023   Procedure: CORONARY ARTERY BYPASS GRAFTING (CABG) TIMES THREE USING THE LEFT INTERNAL MAMMARY ARTERY (LIMA) AND ENDOSCOPICALLY HARVESTED RIGHT GREATER SAPHENOUS VEIN;  Surgeon: Eugenio Hoes, MD;  Location: [facility] OR;  Service: Open Heart Surgery;  Laterality: N/A;   CORONARY STENT INTERVENTION N/A 05/15/2020   Procedure: coronary angiography;  Surgeon: Dalia Heading, MD;  Location: [facility] INVASIVE CV LAB;  Service: Cardiovascular;  Laterality: N/A;   CORONARY STENT INTERVENTION N/A 05/15/2020   Procedure: CORONARY STENT INTERVENTION;  Surgeon: Marcina Millard, MD;  Location: [facility] INVASIVE CV LAB;  Service: Cardiovascular;  Laterality: N/A;   CORONARY STENT PLACEMENT     ENDARTERECTOMY Left 10/01/2015   Procedure: ENDARTERECTOMY CAROTID;  Surgeon: Annice Needy, MD;  Location: [facility] ORS;  Service: Vascular;  Laterality: Left;   ENTEROSCOPY N/A 07/24/2020   Procedure: ENTEROSCOPY;  Surgeon: Pasty Spillers, MD;  Location: [facility] ENDOSCOPY;  Service: Endoscopy;  Laterality: N/A;   ESOPHAGOGASTRODUODENOSCOPY     ESOPHAGOGASTRODUODENOSCOPY (EGD) WITH PROPOFOL N/A 05/31/2016   Procedure: ESOPHAGOGASTRODUODENOSCOPY (EGD) WITH  PROPOFOL;  Surgeon: Scot Jun, MD;  Location: [facility] ENDOSCOPY;  Service: Endoscopy;  Laterality: N/A;   ESOPHAGOGASTRODUODENOSCOPY (EGD) WITH PROPOFOL N/A 07/07/2018   Procedure: ESOPHAGOGASTRODUODENOSCOPY (EGD) WITH PROPOFOL;  Surgeon: Scot Jun, MD;  Location: [facility] ENDOSCOPY;  Service: Endoscopy;  Laterality: N/A;   ESOPHAGOGASTRODUODENOSCOPY (EGD) WITH PROPOFOL N/A 07/17/2018   Procedure: ESOPHAGOGASTRODUODENOSCOPY (EGD) WITH PROPOFOL;  Surgeon: Midge Minium, MD;  Location: [facility] ENDOSCOPY;  Service: Endoscopy;  Laterality: N/A;   ESOPHAGOGASTRODUODENOSCOPY (EGD) WITH PROPOFOL N/A 05/21/2020   Procedure: ESOPHAGOGASTRODUODENOSCOPY (EGD) WITH PROPOFOL;  Surgeon: Wyline Mood, MD;  Location: [facility] ENDOSCOPY;  Service: Gastroenterology;  Laterality: N/A;   ESOPHAGOGASTRODUODENOSCOPY (EGD) WITH PROPOFOL N/A 06/13/2020   Procedure: ESOPHAGOGASTRODUODENOSCOPY (EGD) WITH PROPOFOL;  Surgeon: Midge Minium, MD;  Location: [facility] ENDOSCOPY;  Service: Endoscopy;  Laterality: N/A;   EYE SURGERY     GIVENS CAPSULE STUDY N/A 04/17/2013   Procedure: GIVENS CAPSULE STUDY;  Surgeon: Willis Modena, MD;  Location: [facility] ENDOSCOPY;  Service: Endoscopy;  Laterality: N/A;  patient ate breakfast at 7am    GIVENS CAPSULE STUDY N/A 06/11/2020   Procedure: GIVENS CAPSULE STUDY;  Surgeon: Pasty Spillers, MD;  Location: [facility] ENDOSCOPY;  Service: Endoscopy;  Laterality:  N/A;   GIVENS CAPSULE STUDY N/A 01/29/2021   Procedure: GIVENS CAPSULE STUDY;  Surgeon: Pasty Spillers, MD;  Location: [facility] ENDOSCOPY;  Service: Endoscopy;  Laterality: N/A;   GIVENS CAPSULE STUDY N/A 08/24/2021   Procedure: GIVENS CAPSULE STUDY;  Surgeon: Pasty Spillers, MD;  Location: [facility] ENDOSCOPY;  Service: Endoscopy;  Laterality: N/A;   HEMORRHOID BANDING  07/27/2016   Dr Malissa Hippo   LEFT HEART CATH AND CORONARY ANGIOGRAPHY N/A 07/08/2022   Procedure: LEFT HEART CATH AND CORONARY ANGIOGRAPHY;  Surgeon: Laurier Nancy, MD;  Location:  [facility] INVASIVE CV LAB;  Service: Cardiovascular;  Laterality: N/A;   PARTIAL HYSTERECTOMY     RIGHT AND LEFT HEART CATH Bilateral 09/14/2017   Procedure: RIGHT AND LEFT HEART CATH;  Surgeon: Dalia Heading, MD;  Location: [facility] INVASIVE CV LAB;  Service: Cardiovascular;  Laterality: Bilateral;   RIGHT HEART CATH AND CORONARY ANGIOGRAPHY N/A 01/26/2023   Procedure: RIGHT HEART CATH AND CORONARY ANGIOGRAPHY;  Surgeon: Orbie Pyo, MD;  Location: [facility] INVASIVE CV LAB;  Service: Cardiovascular;  Laterality: N/A;   RIGHT/LEFT HEART CATH AND CORONARY ANGIOGRAPHY Right 05/15/2020   Procedure: Right/Left Heart cath;  Surgeon: Dalia Heading, MD;  Location: [facility] INVASIVE CV LAB;  Service: Cardiovascular;  Laterality: Right;   TEE WITHOUT CARDIOVERSION N/A 03/02/2023   Procedure: TRANSESOPHAGEAL ECHOCARDIOGRAM;  Surgeon: Eugenio Hoes, MD;  Location: [facility] OR;  Service: Open Heart Surgery;  Laterality: N/A;   TRIGGER FINGER RELEASE      FAMILY HISTORY Family History  Problem Relation Age of Onset   Uterine cancer Mother    Breast cancer Mother 23   Seizures Father    Stroke Father    Diabetes Father    COPD Father    Colon cancer Neg Hx       ADVANCED DIRECTIVES:    HEALTH MAINTENANCE: Social History   Tobacco Use   Smoking status: Former    Types: Cigarettes    Quit date: 02/15/1985    Years since quitting: 38.3   Smokeless tobacco: Never   Tobacco comments:    quit about 31 years ago  Vaping Use   Vaping Use: Never used  Substance Use Topics   Alcohol use: No    Alcohol/week: 0.0 standard drinks of alcohol   Drug use: No    Colonoscopy:  PAP:  Bone density: Osteoporosis- DEXA 06/2017 (on fosamax)  Lipid panel:  Allergies  Allergen Reactions   Sulfa Antibiotics Other (See Comments)    Rash and flushing   Invokamet [Canagliflozin-Metformin Hcl] Other (See Comments)    Yeast Infection   Lovastatin Rash   Penicillins Rash    Has patient had a PCN reaction causing immediate  rash, facial/tongue/throat swelling, SOB or lightheadedness with hypotension:Yes Has patient had a PCN reaction causing severe rash involving mucus membranes or skin necrosis:all over body Has patient had a PCN reaction that required hospitalization: No Has patient had a PCN reaction occurring within the last 10 years: No If all of the above answers are "NO", then may proceed with Cephalosporin use.    Vicodin [Hydrocodone-Acetaminophen] Rash    Current Outpatient Medications  Medication Sig Dispense Refill   albuterol (VENTOLIN HFA) 108 (90 Base) MCG/ACT inhaler Inhale 2 puffs into the lungs every 4 (four) hours as needed for wheezing or shortness of breath.      amiodarone (PACERONE) 200 MG tablet Take 200 mg by mouth daily.     amitriptyline (ELAVIL) 25 MG tablet Take 25  mg by mouth at bedtime as needed for sleep.      Ascorbic Acid (VITAMIN C) 1000 MG tablet Take 1,000 mg by mouth daily.     aspirin EC 325 MG tablet Take 1 tablet (325 mg total) by mouth daily.     BD INSULIN SYRINGE U/F 31G X 5/16" 1 ML MISC      doxycycline (VIBRA-TABS) 100 MG tablet Take 1 tablet (100 mg total) by mouth every 12 (twelve) hours. 10 tablet 0   Dulaglutide 3 MG/0.5ML SOPN Inject 3 mg into the skin every Saturday.     fexofenadine (ALLEGRA ALLERGY) 180 MG tablet Take 180 mg by mouth as needed for allergies or rhinitis.     insulin NPH-regular Human (NOVOLIN 70/30) (70-30) 100 UNIT/ML injection Inject 30 Units into the skin with breakfast, with lunch, and with evening meal.     isosorbide mononitrate (IMDUR) 30 MG 24 hr tablet Take 1 tablet (30 mg total) by mouth daily. 90 tablet 3   lactobacillus acidophilus (BACID) TABS tablet Take 2 tablets by mouth 3 (three) times daily. 15 tablet 0   losartan (COZAAR) 100 MG tablet Take 1 tablet (100 mg total) by mouth daily. 90 tablet 3   metoprolol succinate (TOPROL-XL) 100 MG 24 hr tablet Take 1 tablet (100 mg total) by mouth daily. Take with or immediately following  a meal.     Multiple Vitamin (MULTI-VITAMIN) tablet Take 1 tablet by mouth daily.     naproxen sodium (ANAPROX) 550 MG tablet Take 550 mg by mouth 2 (two) times daily as needed.     nitroGLYCERIN (NITROSTAT) 0.4 MG SL tablet Place 1 tablet (0.4 mg total) under the tongue every 5 (five) minutes as needed for chest pain. 90 tablet 3   ONETOUCH VERIO test strip      pantoprazole (PROTONIX) 40 MG tablet Take 40 mg by mouth daily.     sertraline (ZOLOFT) 25 MG tablet Take 1 tablet by mouth once daily 30 tablet 0   SPIRIVA HANDIHALER 18 MCG inhalation capsule Place 18 mcg into inhaler and inhale at bedtime.     UNABLE TO FIND Take 2,400 mg by mouth daily. Med Name: [place] Cinnamon + Berberine& Chromium     Zinc 50 MG TABS Take 50 mg by mouth daily.     ezetimibe (ZETIA) 10 MG tablet Take 1 tablet (10 mg total) by mouth daily. 90 tablet 3   No current facility-administered medications for this visit.   Facility-Administered Medications Ordered in Other Visits  Medication Dose Route Frequency Provider Last Rate Last Admin   heparin lock flush 100 unit/mL  500 Units Intravenous Once Alinda Dooms, NP       sodium chloride flush (NS) 0.9 % injection 10 mL  10 mL Intravenous Once Alinda Dooms, NP       sodium chloride flush (NS) 0.9 % injection 3 mL  3 mL Intravenous Q12H Scoggins, Amber, NP        OBJECTIVE: Vitals:   05/31/23 1113  BP: (!) 166/57  Pulse: (!) 54  Resp: 17  Temp: 98.3 F (36.8 C)  SpO2: 98%      Body mass index is 35.25 kg/m.    ECOG FS:0 - Asymptomatic  General: Well-developed, well-nourished, no acute distress. Eyes: Pink conjunctiva, anicteric sclera. HEENT: Normocephalic, moist mucous membranes. Lungs: No audible wheezing or coughing. Heart: Regular rate and rhythm. Abdomen: Soft, nontender, no obvious distention. Musculoskeletal: No edema, cyanosis, or clubbing. Neuro: Alert, answering all questions  appropriately. Cranial nerves grossly intact. Skin: No  rashes or petechiae noted. Psych: Normal affect.  LAB RESULTS:  Lab Results  Component Value Date   NA 131 (L) 03/15/2023   K 4.8 03/15/2023   CL 96 (L) 03/15/2023   CO2 24 03/15/2023   GLUCOSE 230 (H) 03/15/2023   BUN 28 (H) 03/15/2023   CREATININE 1.07 (H) 03/15/2023   CALCIUM 9.2 05/04/2023   PROT 5.6 (L) 03/07/2023   ALBUMIN 2.6 (L) 03/07/2023   AST 19 03/07/2023   ALT 6 03/07/2023   ALKPHOS 53 03/07/2023   BILITOT 1.3 (H) 03/07/2023   GFRNONAA 54 (L) 03/15/2023   GFRAA >60 07/24/2020    Lab Results  Component Value Date   WBC 5.8 05/31/2023   NEUTROABS 4.4 03/08/2023   HGB 11.0 (L) 05/31/2023   HCT 34.7 (L) 05/31/2023   MCV 87.4 05/31/2023   PLT 199 05/31/2023   Lab Results  Component Value Date   IRON 25 (L) 03/04/2023   TIBC 182 (L) 03/04/2023   IRONPCTSAT 14 03/04/2023    Lab Results  Component Value Date   FERRITIN 284 03/04/2023     STUDIES: DG Knee 1-2 Views Right  Result Date: 05/06/2023 CLINICAL DATA:  Right knee pain and swelling. EXAM: RIGHT KNEE - 1-2 VIEW COMPARISON:  None Available. FINDINGS: No fracture or dislocation is noted. Small suprapatellar joint effusion is noted. Vascular calcifications are noted. Surgical clips are noted posterior to the knee. Severe narrowing of medial joint space is noted with osteophyte formation. IMPRESSION: Severe degenerative joint disease is noted medially. Small suprapatellar joint effusion. No fracture or dislocation. Electronically Signed   By: Lupita Raider M.D.   On: 05/06/2023 09:52   DG Foot Complete Right  Result Date: 05/04/2023 Please see detailed radiograph report in office note.   ASSESSMENT: Iron deficiency anemia, secondary to chronic blood loss.  PLAN:    Iron deficiency anemia: Patient's hemoglobin has improved to 11.0, but iron stores are pending at time of dictation.  B12 and folate levels also are pending.  Patient underwent capsule endoscopy in February 2022 that only revealed 1  nonbleeding lymphangiectasia.  Prior to that patient's most recent luminal evaluation was in July 2021. Bone marrow biopsy on February 15, 2017 did not reveal any significant pathology.  Will await iron stores to schedule follow-up. Cardiac disease: Continue follow-up with cardiac surgery as scheduled.   Hypertension: Chronic and unchanged.  Continue evaluation and treatment per primary care.   Patient expressed understanding and was in agreement with this plan. She also understands that She can call clinic at any time with any questions, concerns, or complaints.    Jeralyn Ruths, MD 05/31/23 12:05 PM

## 2023-05-31 NOTE — Progress Notes (Signed)
Concerns today headache, dizziness and recent fall

## 2023-06-01 LAB — VITAMIN B12: Vitamin B-12: 423 pg/mL (ref 180–914)

## 2023-06-03 ENCOUNTER — Ambulatory Visit: Payer: Medicare HMO | Attending: Internal Medicine | Admitting: Pharmacist Clinician (PhC)/ Clinical Pharmacy Specialist

## 2023-06-03 ENCOUNTER — Telehealth: Payer: Self-pay | Admitting: Pharmacist Clinician (PhC)/ Clinical Pharmacy Specialist

## 2023-06-03 ENCOUNTER — Encounter: Payer: Self-pay | Admitting: Pharmacist Clinician (PhC)/ Clinical Pharmacy Specialist

## 2023-06-03 DIAGNOSIS — E782 Mixed hyperlipidemia: Secondary | ICD-10-CM

## 2023-06-03 NOTE — Assessment & Plan Note (Signed)
Assessment: Patient with ASCVD not at LDL goal of < 55 Most recent LDL 133 on 04/18/23 Not able to tolerate statins secondary to myalgias Not able to tolerate ezetimibe secondary to GI distress Reviewed options for lowering LDL cholesterol, including PCSK-9 inhibitors, bempedoic acid and inclisiran.  Discussed mechanisms of action, dosing, side effects, potential decreases in LDL cholesterol and costs.  Also reviewed potential options for patient assistance.  Plan: Patient agreeable to starting Repatha 140 mg q14d Repeat labs after:  3 months Lipid Liver function Patient was given information on [employer] grant/ - will sign her up for [employer] once PA approved.

## 2023-06-03 NOTE — Telephone Encounter (Signed)
Please do PA for Repatha 140 mg 

## 2023-06-03 NOTE — Patient Instructions (Signed)
Your Results:             Your most recent labs Goal  Total Cholesterol 188 < 200  Triglycerides 127 < 150  HDL (happy/good cholesterol) 45 > 40  LDL (lousy/bad cholesterol 133 < 55   Medication changes:  We will start the process to get Repatha covered by your insurance.  Once the prior authorization is complete, I will call/send a MyChart message to let you know and confirm pharmacy information.   You will take one injection every 14 days  Lab orders:  We want to repeat labs after 2-3 months.  We will send you a lab order to remind you once we get closer to that time.    Patient Assistance:  The [employer] foundation offers assistance to help pay for medication copays.  They will cover copays for all cholesterol lowering meds, including statins, fibrates, omega-3 oils, ezetimibe, Repatha, Praluent, Nexletol, Nexlizet.  The cards are usually good for $2,500 or 12 months, whichever comes first. Go to healthwellfoundation.org Click on "Apply Now" Answer questions as to whom is applying (patient or representative) Your disease fund will be "hypercholesterolemia - Medicare access" Select the cholesterol medication you need assistance with (Repatha, Praluent, Nexlizet...) They will ask question about qualifying diagnosis - you can mark "yes"; and do you have insurance coverage.   When they ask what type of assistance you are interested in - "copay assistance" When you submit, the approval is usually within minutes.  You will need to print the card information from the site You will need to show this information to your pharmacy, they will bill your Medicare Part D plan first -then bill [employer] --for the copay.   You can also call them at [phone], although the hold times can be quite long.   Thank you for choosing [facility]

## 2023-06-03 NOTE — Progress Notes (Signed)
Office Visit    Patient Name: Joyce Potter Date of Encounter: 06/03/2023  Primary Care Provider:  Leanord Asal, Nelva Bush, MD Primary Cardiologist:  Julien Nordmann, MD  Chief Complaint    Hyperlipidemia   Significant Past Medical History   ASCVD L CEA (2016) , CABG x 2 (02/2023)  HTN On losartan, metorpolol  DM2 3/24 A1c 5.6, improved from 6.9 last year, on Trulicity  TIA After CEA        Allergies  Allergen Reactions   Sulfa Antibiotics Other (See Comments)    Rash and flushing   Invokamet [Canagliflozin-Metformin Hcl] Other (See Comments)    Yeast Infection   Lovastatin Rash   Penicillins Rash    Has patient had a PCN reaction causing immediate rash, facial/tongue/throat swelling, SOB or lightheadedness with hypotension:Yes Has patient had a PCN reaction causing severe rash involving mucus membranes or skin necrosis:all over body Has patient had a PCN reaction that required hospitalization: No Has patient had a PCN reaction occurring within the last 10 years: No If all of the above answers are "NO", then may proceed with Cephalosporin use.    Vicodin [Hydrocodone-Acetaminophen] Rash    History of Present Illness    Joyce Potter is a 77 y.o. female patient of Dr Mariah Milling, in the office today to discuss options for cholesterol management.  She is in the office with her daughter today.  Patient states that dizziness is her biggest issue, believes it to be from a medication, so she has been stopping her meds one at a time, for a week, to see if the dizziness abates.   Advised   Insurance Carrier:  [employer]   $47/month, $138 in coverage gap  LDL Cholesterol goal:  LDL <55  Current Medications:   ezetimibe 10 mg   Previously tried:  atorvastatin, pravastatin, simvastatin - myalgias  Ezetimibe - nausea/GI distress  Family history:  mother had breast/uterine cancers; father with DM, COPD, stroke  Social Hx: Tobacco: former smoker, quit > 30 years ago Alcohol:   no  Diet:   more home cooked meals, some vegetables - canned, daughter takes spaghetti (with vegetables) weekly for her;    Exercise: unable to do much, having dizziness, balance issues.  Has had 4 mechanical falls     Accessory Clinical Findings   Lab Results  Component Value Date   CHOL 188 04/18/2023   HDL 45 04/18/2023   LDLCALC 118 (H) 04/18/2023   LDLDIRECT 133 (H) 04/18/2023   TRIG 127 04/18/2023   CHOLHDL 4.2 04/18/2023    Lab Results  Component Value Date   ALT 6 03/07/2023   AST 19 03/07/2023   ALKPHOS 53 03/07/2023   BILITOT 1.3 (H) 03/07/2023   Lab Results  Component Value Date   CREATININE 1.07 (H) 03/15/2023   BUN 28 (H) 03/15/2023   NA 131 (L) 03/15/2023   K 4.8 03/15/2023   CL 96 (L) 03/15/2023   CO2 24 03/15/2023   Lab Results  Component Value Date   HGBA1C 5.6 02/25/2023    Home Medications    Current Outpatient Medications  Medication Sig Dispense Refill   albuterol (VENTOLIN HFA) 108 (90 Base) MCG/ACT inhaler Inhale 2 puffs into the lungs every 4 (four) hours as needed for wheezing or shortness of breath.      amiodarone (PACERONE) 200 MG tablet Take 200 mg by mouth daily.     amitriptyline (ELAVIL) 25 MG tablet Take 25 mg by mouth at bedtime as needed  for sleep.      Ascorbic Acid (VITAMIN C) 1000 MG tablet Take 1,000 mg by mouth daily.     aspirin EC 325 MG tablet Take 1 tablet (325 mg total) by mouth daily.     BD INSULIN SYRINGE U/F 31G X 5/16" 1 ML MISC      doxycycline (VIBRA-TABS) 100 MG tablet Take 1 tablet (100 mg total) by mouth every 12 (twelve) hours. 10 tablet 0   Dulaglutide 3 MG/0.5ML SOPN Inject 3 mg into the skin every Saturday.     fexofenadine (ALLEGRA ALLERGY) 180 MG tablet Take 180 mg by mouth as needed for allergies or rhinitis.     insulin NPH-regular Human (NOVOLIN 70/30) (70-30) 100 UNIT/ML injection Inject 30 Units into the skin with breakfast, with lunch, and with evening meal.     isosorbide mononitrate (IMDUR) 30  MG 24 hr tablet Take 1 tablet (30 mg total) by mouth daily. 90 tablet 3   lactobacillus acidophilus (BACID) TABS tablet Take 2 tablets by mouth 3 (three) times daily. 15 tablet 0   losartan (COZAAR) 100 MG tablet Take 1 tablet (100 mg total) by mouth daily. 90 tablet 3   metoprolol succinate (TOPROL-XL) 100 MG 24 hr tablet Take 1 tablet (100 mg total) by mouth daily. Take with or immediately following a meal.     Multiple Vitamin (MULTI-VITAMIN) tablet Take 1 tablet by mouth daily.     naproxen sodium (ANAPROX) 550 MG tablet Take 550 mg by mouth 2 (two) times daily as needed.     nitroGLYCERIN (NITROSTAT) 0.4 MG SL tablet Place 1 tablet (0.4 mg total) under the tongue every 5 (five) minutes as needed for chest pain. 90 tablet 3   ONETOUCH VERIO test strip      pantoprazole (PROTONIX) 40 MG tablet Take 40 mg by mouth daily.     sertraline (ZOLOFT) 25 MG tablet Take 1 tablet by mouth once daily 30 tablet 0   SPIRIVA HANDIHALER 18 MCG inhalation capsule Place 18 mcg into inhaler and inhale at bedtime.     UNABLE TO FIND Take 2,400 mg by mouth daily. Med Name: [place] Cinnamon + Berberine& Chromium     Zinc 50 MG TABS Take 50 mg by mouth daily.     No current facility-administered medications for this visit.   Facility-Administered Medications Ordered in Other Visits  Medication Dose Route Frequency Provider Last Rate Last Admin   heparin lock flush 100 unit/mL  500 Units Intravenous Once Alinda Dooms, NP       sodium chloride flush (NS) 0.9 % injection 10 mL  10 mL Intravenous Once Alinda Dooms, NP       sodium chloride flush (NS) 0.9 % injection 3 mL  3 mL Intravenous Q12H Scoggins, Amber, NP         Assessment & Plan    HLD (hyperlipidemia) Assessment: Patient with ASCVD not at LDL goal of < 55 Most recent LDL 133 on 04/18/23 Not able to tolerate statins secondary to myalgias Not able to tolerate ezetimibe secondary to GI distress Reviewed options for lowering LDL cholesterol,  including PCSK-9 inhibitors, bempedoic acid and inclisiran.  Discussed mechanisms of action, dosing, side effects, potential decreases in LDL cholesterol and costs.  Also reviewed potential options for patient assistance.  Plan: Patient agreeable to starting Repatha 140 mg q14d Repeat labs after:  3 months Lipid Liver function Patient was given information on [employer] grant/ - will sign her up for [employer]  once PA approved.    Phillips Hay, PharmD CPP [facility] [address]  [place], [place] [address] [phone]  06/03/2023, 4:28 PM

## 2023-06-06 ENCOUNTER — Ambulatory Visit (INDEPENDENT_AMBULATORY_CARE_PROVIDER_SITE_OTHER): Payer: Medicare HMO | Admitting: Podiatry

## 2023-06-06 ENCOUNTER — Encounter: Payer: Self-pay | Admitting: Oncology

## 2023-06-06 ENCOUNTER — Other Ambulatory Visit (HOSPITAL_COMMUNITY): Payer: Self-pay

## 2023-06-06 ENCOUNTER — Ambulatory Visit (INDEPENDENT_AMBULATORY_CARE_PROVIDER_SITE_OTHER): Payer: Medicare HMO

## 2023-06-06 DIAGNOSIS — S92352A Displaced fracture of fifth metatarsal bone, left foot, initial encounter for closed fracture: Secondary | ICD-10-CM

## 2023-06-06 DIAGNOSIS — S92355D Nondisplaced fracture of fifth metatarsal bone, left foot, subsequent encounter for fracture with routine healing: Secondary | ICD-10-CM

## 2023-06-06 NOTE — Patient Instructions (Signed)
Look for an "EvenUp" shoe attachment on [employer] or at [employer]. This will level out your hips while you are walking in the CAM boot. Wear this on the other foot around a supportive sneaker:     

## 2023-06-06 NOTE — Telephone Encounter (Signed)
PER TEST CLAIM PA ALREADY ON FILE, EXPIRES 12/27/23

## 2023-06-07 NOTE — Progress Notes (Signed)
  Subjective:  Patient ID: Joyce Potter, female    DOB: 12/12/1946,  MRN: [id number]  Chief Complaint  Patient presents with   Fracture    follow up fx left foot- had lab work    77 y.o. female presents with the above complaint. History confirmed with patient.  She is doing well, it has been hard to balance in the boot but her pain is improved quite a bit  Objective:  Physical Exam: warm, good capillary refill, no trophic changes or ulcerative lesions, normal DP and PT pulses, normal sensory exam, and today she has no pain to palpation, good 5 out of 5 strength  Radiographs: New left foot radiographs taken today show good early consolidation across fracture site with minimal diastases, still significant lucency plantar lateral Assessment:   1. Closed nondisplaced fracture of fifth metatarsal bone of left foot with routine healing, subsequent encounter      Plan:  Patient was evaluated and treated and all questions answered.  Showing signs of good healing with nonoperative treatment with boot immobilization.  Discussed with her that I would recommend she continue this course for another 4 weeks and then hopefully can transition back to regular shoe gear with a lace up ankle brace.  She will return in 4 weeks for new x-rays and then hopefully we will make that transition.  Return in about 4 weeks (around 07/04/2023) for fracture follow up (new left foot xrays).

## 2023-06-07 NOTE — Telephone Encounter (Unsigned)
[employer] Grant approved to 05/06/24  ID  [id number] BIN [id number] PCN [id number]  GRP [id number]   Patient aware to have grant billed, will call if has questions.

## 2023-06-08 ENCOUNTER — Ambulatory Visit: Payer: Medicare HMO | Admitting: Podiatry

## 2023-06-09 MED ORDER — REPATHA SURECLICK 140 MG/ML ~~LOC~~ SOAJ: 140.0000 mg | SUBCUTANEOUS | 3 refills | Status: DC

## 2023-06-21 ENCOUNTER — Encounter: Payer: Self-pay | Admitting: Medical

## 2023-06-21 ENCOUNTER — Ambulatory Visit: Payer: Medicare HMO | Attending: Medical | Admitting: Medical

## 2023-06-21 VITALS — BP 150/74 | HR 67 | Ht 63.0 in | Wt 194.2 lb

## 2023-06-21 DIAGNOSIS — I350 Nonrheumatic aortic (valve) stenosis: Secondary | ICD-10-CM

## 2023-06-21 DIAGNOSIS — E782 Mixed hyperlipidemia: Secondary | ICD-10-CM | POA: Diagnosis not present

## 2023-06-21 DIAGNOSIS — Z952 Presence of prosthetic heart valve: Secondary | ICD-10-CM

## 2023-06-21 DIAGNOSIS — I10 Essential (primary) hypertension: Secondary | ICD-10-CM

## 2023-06-21 DIAGNOSIS — I25118 Atherosclerotic heart disease of native coronary artery with other forms of angina pectoris: Secondary | ICD-10-CM | POA: Diagnosis not present

## 2023-06-21 NOTE — Progress Notes (Signed)
Cardiology Office Note:    Date:  06/21/2023   ID:  Joyce Potter, DOB 1946/03/30, MRN [id number]  PCP:  Leanord Asal, Nelva Bush, MD  [facility] Cardiologist:  Julien Nordmann, MD  [facility] Electrophysiologist:  None   Referring MD: Leanord Asal, Consuelo *   Chief Complaint: 1 month follow-up  History of Present Illness:    Joyce Potter is a 77 y.o. female with a hx of CAD s/p remote PCI with recent CABG x 2 with post-op afib, severe AS s/p SAVR (with CABG), TIA s/p let CEA in 2016, DM2, HTN, HLD, and iron deficiency anemia who presents for 1 month follow-up.   She was previously followed by [facility] cardiology with remote cardiac catheterizations performed by them as outlined in CV studies.   Echo in March 2023 showed an EF greater than 55%, mild LVH, normal RV function, mild AI, moderate AAS.  Underwent left heart cath in July 2023 by Dr. Lennette Bihari, which showed proximal LAD 55% stenosis, sequential 75 and 80% stenosis in the first diagonal, proximal RCA 30% stenosis.  EF 55 to 60%.  Medical management was recommended.   She established with Dr. Mariah Milling in January 2024 noting exertional dyspnea and chest tightness.  Given symptoms she underwent echo in January 2024 showing EF of 60 to 65% no regional wall motion abnormalities, grade 1 diastolic dysfunction, normal RV systolic function and ventricular cavity size, trivial mitral regurgitation, calcified aortic valve with severe stenosis with a mean gradient of 40.8 mmHg and a valve area of 0.79 cm.  She was referred to the structural heart clinic.   Right and left heart cath in January 2024 showed proximal LAD 55% stenosis, sequential 95 to 90% stenosis in the D1, patent proximal RCA stent with minimal in-stent restenosis of 20% stenosis.  It was recommended the patient undergo multidisciplinary review regarding SAVR with CABG versus TAVR and bifurcation stenting.  Patient saw CVTS 02/21/2023 and it was felt was best served  surgical revascularization and SAVR.   Patient was brought to the operating room on 03/02/2023 and underwent CABG utilizing LIMA to LAD and R SVG to tag as well as an dose, being greater saphenous vein harvest of the right thigh.  She underwent aortic valve replacement utilizing 23 mm Inspiris Resilia valve.  She tolerated the procedure well without complication.  Hospitalization was complicated with AKI, right-sided pleural effusion, hypotension requiring midodrine, post-op afib briefly on amiodarone, volume overload requiring diuresis, and anemia requiring 2 unit PRBCs. She went to a nursing home and was discharged home 2 weeks later.   The patient was last seen 05/19/23 and reported a mechanical fall resulting in left ankle fracture.   Today, the patient reports occasional dizziness and lightheadedness. It occurs when she turns her head a certain way. She denies chest pain, lower leg edema, orthopnea or pnd. She has chronic SOB. She continued amiodarone, even though this was stopped. She was instructed to stop amiodarone. She is no longer in a cast. She is open to doing cardiac rehab.   Past Medical History:  Diagnosis Date   ADHD (attention deficit hyperactivity disorder)    Anemia    iron deficiency   Anxiety    Aortic stenosis    Arthritis    Asthma    Basal cell carcinoma    CAD (coronary artery disease)    s/p Left circumflex stent in 2012   Carotid stenosis    Cataract    Heart murmur  High cholesterol    HTN (hypertension)    Hyperlipidemia    IDDM (insulin dependent diabetes mellitus)    Multiple thyroid nodules    Benign   Neuromuscular disorder (HCC)    Peptic ulcer disease    Pneumonia 1952   PONV (postoperative nausea and vomiting)    Retinal artery occlusion    on left   Sleep apnea    Stroke [facility]) 2018   ischemic    Past Surgical History:  Procedure Laterality Date   ABDOMINAL HYSTERECTOMY     AORTIC VALVE REPLACEMENT N/A 03/02/2023   Procedure: AORTIC VALVE  REPLACEMENT (AVR) USING EDWARDS INSPIRIS RESILIA 23 MM AORTIC VALVE;  Surgeon: Eugenio Hoes, MD;  [facility];  Service: Open Heart Surgery;  Laterality: N/A;   ARTERY BIOPSY Left 11/19/2015   Procedure: BIOPSY TEMPORAL ARTERY;  Surgeon: Annice Needy, MD;  Location: [facility] ORS;  Service: Vascular;  Laterality: Left;   BREAST BIOPSY Left 2002   core- neg   BREAST EXCISIONAL BIOPSY     ??? not visible scar on neither breast- pt believes it to be right breast    CARDIAC CATHETERIZATION N/A 08/08/2015   Procedure: Right and Left Heart Cath and Coronary Angiography;  Surgeon: Dalia Heading, MD;  Location: [facility] INVASIVE CV LAB;  Service: Cardiovascular;  Laterality: N/A;   CARDIAC CATHETERIZATION Bilateral 09/03/2016   Procedure: Right/Left Heart Cath and Coronary Angiography;  Surgeon: Dalia Heading, MD;  Location: [facility] INVASIVE CV LAB;  Service: Cardiovascular;  Laterality: Bilateral;   CATARACT EXTRACTION W/ INTRAOCULAR LENS  IMPLANT, BILATERAL     COLONOSCOPY     in 2013- internal hemorrhoids   COLONOSCOPY WITH PROPOFOL N/A 07/07/2018   Procedure: COLONOSCOPY WITH PROPOFOL;  Surgeon: Scot Jun, MD;  Location: [facility] ENDOSCOPY;  Service: Endoscopy;  Laterality: N/A;   COLONOSCOPY WITH PROPOFOL N/A 05/22/2020   Procedure: COLONOSCOPY WITH PROPOFOL;  Surgeon: Wyline Mood, MD;  Location: [facility] ENDOSCOPY;  Service: Gastroenterology;  Laterality: N/A;   CORONARY ANGIOGRAPHY N/A 09/14/2017   Procedure: CORONARY ANGIOGRAPHY;  Surgeon: Dalia Heading, MD;  Location: [facility] INVASIVE CV LAB;  Service: Cardiovascular;  Laterality: N/A;   CORONARY ANGIOPLASTY     CORONARY ARTERY BYPASS GRAFT N/A 03/02/2023   Procedure: CORONARY ARTERY BYPASS GRAFTING (CABG) TIMES THREE USING THE LEFT INTERNAL MAMMARY ARTERY (LIMA) AND ENDOSCOPICALLY HARVESTED RIGHT GREATER SAPHENOUS VEIN;  Surgeon: Eugenio Hoes, MD;  Location: [facility] OR;  Service: Open Heart Surgery;  Laterality: N/A;   CORONARY STENT INTERVENTION N/A 05/15/2020    Procedure: coronary angiography;  Surgeon: Dalia Heading, MD;  Location: [facility] INVASIVE CV LAB;  Service: Cardiovascular;  Laterality: N/A;   CORONARY STENT INTERVENTION N/A 05/15/2020   Procedure: CORONARY STENT INTERVENTION;  Surgeon: Marcina Millard, MD;  Location: [facility] INVASIVE CV LAB;  Service: Cardiovascular;  Laterality: N/A;   CORONARY STENT PLACEMENT     ENDARTERECTOMY Left 10/01/2015   Procedure: ENDARTERECTOMY CAROTID;  Surgeon: Annice Needy, MD;  Location: [facility] ORS;  Service: Vascular;  Laterality: Left;   ENTEROSCOPY N/A 07/24/2020   Procedure: ENTEROSCOPY;  Surgeon: Pasty Spillers, MD;  Location: [facility] ENDOSCOPY;  Service: Endoscopy;  Laterality: N/A;   ESOPHAGOGASTRODUODENOSCOPY     ESOPHAGOGASTRODUODENOSCOPY (EGD) WITH PROPOFOL N/A 05/31/2016   Procedure: ESOPHAGOGASTRODUODENOSCOPY (EGD) WITH PROPOFOL;  Surgeon: Scot Jun, MD;  Location: [facility] ENDOSCOPY;  Service: Endoscopy;  Laterality: N/A;   ESOPHAGOGASTRODUODENOSCOPY (EGD) WITH PROPOFOL N/A 07/07/2018   Procedure: ESOPHAGOGASTRODUODENOSCOPY (EGD) WITH PROPOFOL;  Surgeon: Lynnae Prude  T, MD;  Location: [facility] ENDOSCOPY;  Service: Endoscopy;  Laterality: N/A;   ESOPHAGOGASTRODUODENOSCOPY (EGD) WITH PROPOFOL N/A 07/17/2018   Procedure: ESOPHAGOGASTRODUODENOSCOPY (EGD) WITH PROPOFOL;  Surgeon: Midge Minium, MD;  Location: [facility] ENDOSCOPY;  Service: Endoscopy;  Laterality: N/A;   ESOPHAGOGASTRODUODENOSCOPY (EGD) WITH PROPOFOL N/A 05/21/2020   Procedure: ESOPHAGOGASTRODUODENOSCOPY (EGD) WITH PROPOFOL;  Surgeon: Wyline Mood, MD;  Location: [facility] ENDOSCOPY;  Service: Gastroenterology;  Laterality: N/A;   ESOPHAGOGASTRODUODENOSCOPY (EGD) WITH PROPOFOL N/A 06/13/2020   Procedure: ESOPHAGOGASTRODUODENOSCOPY (EGD) WITH PROPOFOL;  Surgeon: Midge Minium, MD;  Location: [facility] ENDOSCOPY;  Service: Endoscopy;  Laterality: N/A;   EYE SURGERY     GIVENS CAPSULE STUDY N/A 04/17/2013   Procedure: GIVENS CAPSULE STUDY;  Surgeon: Willis Modena, MD;  Location: [facility] ENDOSCOPY;  Service: Endoscopy;  Laterality: N/A;  patient ate breakfast at 7am    GIVENS CAPSULE STUDY N/A 06/11/2020   Procedure: GIVENS CAPSULE STUDY;  Surgeon: Pasty Spillers, MD;  Location: [facility] ENDOSCOPY;  Service: Endoscopy;  Laterality: N/A;   GIVENS CAPSULE STUDY N/A 01/29/2021   Procedure: GIVENS CAPSULE STUDY;  Surgeon: Pasty Spillers, MD;  Location: [facility] ENDOSCOPY;  Service: Endoscopy;  Laterality: N/A;   GIVENS CAPSULE STUDY N/A 08/24/2021   Procedure: GIVENS CAPSULE STUDY;  Surgeon: Pasty Spillers, MD;  Location: [facility] ENDOSCOPY;  Service: Endoscopy;  Laterality: N/A;   HEMORRHOID BANDING  07/27/2016   Dr Malissa Hippo   LEFT HEART CATH AND CORONARY ANGIOGRAPHY N/A 07/08/2022   Procedure: LEFT HEART CATH AND CORONARY ANGIOGRAPHY;  Surgeon: Laurier Nancy, MD;  Location: [facility] INVASIVE CV LAB;  Service: Cardiovascular;  Laterality: N/A;   PARTIAL HYSTERECTOMY     RIGHT AND LEFT HEART CATH Bilateral 09/14/2017   Procedure: RIGHT AND LEFT HEART CATH;  Surgeon: Dalia Heading, MD;  Location: [facility] INVASIVE CV LAB;  Service: Cardiovascular;  Laterality: Bilateral;   RIGHT HEART CATH AND CORONARY ANGIOGRAPHY N/A 01/26/2023   Procedure: RIGHT HEART CATH AND CORONARY ANGIOGRAPHY;  Surgeon: Orbie Pyo, MD;  [facility] CV LAB;  Service: Cardiovascular;  Laterality: N/A;   RIGHT/LEFT HEART CATH AND CORONARY ANGIOGRAPHY Right 05/15/2020   Procedure: Right/Left Heart cath;  Surgeon: Dalia Heading, MD;  Location: [facility] INVASIVE CV LAB;  Service: Cardiovascular;  Laterality: Right;   TEE WITHOUT CARDIOVERSION N/A 03/02/2023   Procedure: TRANSESOPHAGEAL ECHOCARDIOGRAM;  Surgeon: Eugenio Hoes, MD;  Location[facility] OR;  Service: Open Heart Surgery;  Laterality: N/A;   TRIGGER FINGER RELEASE      Current Medications: Current Meds  Medication Sig   albuterol (VENTOLIN HFA) 108 (90 Base) MCG/ACT inhaler Inhale 2 puffs into the lungs every 4 (four) hours  as needed for wheezing or shortness of breath.    amitriptyline (ELAVIL) 25 MG tablet Take 25 mg by mouth at bedtime as needed for sleep.    Ascorbic Acid (VITAMIN C) 1000 MG tablet Take 1,000 mg by mouth daily.   aspirin EC 325 MG tablet Take 1 tablet (325 mg total) by mouth daily.   BD INSULIN SYRINGE U/F 31G X 5/16" 1 ML MISC    doxycycline (VIBRA-TABS) 100 MG tablet Take 1 tablet (100 mg total) by mouth every 12 (twelve) hours.   Dulaglutide 3 MG/0.5ML SOPN Inject 3 mg into the skin every Saturday.   Evolocumab (REPATHA SURECLICK) 140 MG/ML SOAJ Inject 140 mg into the skin every 14 (fourteen) days.   fexofenadine (ALLEGRA ALLERGY) 180 MG tablet Take 180 mg by mouth as needed for allergies or rhinitis.  insulin NPH-regular Human (NOVOLIN 70/30) (70-30) 100 UNIT/ML injection Inject 30 Units into the skin with breakfast, with lunch, and with evening meal.   isosorbide mononitrate (IMDUR) 30 MG 24 hr tablet Take 1 tablet (30 mg total) by mouth daily.   lactobacillus acidophilus (BACID) TABS tablet Take 2 tablets by mouth 3 (three) times daily.   losartan (COZAAR) 100 MG tablet Take 1 tablet (100 mg total) by mouth daily.   metoprolol succinate (TOPROL-XL) 100 MG 24 hr tablet Take 1 tablet (100 mg total) by mouth daily. Take with or immediately following a meal.   Multiple Vitamin (MULTI-VITAMIN) tablet Take 1 tablet by mouth daily.   naproxen sodium (ANAPROX) 550 MG tablet Take 550 mg by mouth 2 (two) times daily as needed.   nitroGLYCERIN (NITROSTAT) 0.4 MG SL tablet Place 1 tablet (0.4 mg total) under the tongue every 5 (five) minutes as needed for chest pain.   ondansetron (ZOFRAN-ODT) 4 MG disintegrating tablet Take by mouth as needed.   pantoprazole (PROTONIX) 40 MG tablet Take 40 mg by mouth daily.   sertraline (ZOLOFT) 25 MG tablet Take 1 tablet by mouth once daily   SPIRIVA HANDIHALER 18 MCG inhalation capsule Place 18 mcg into inhaler and inhale at bedtime.   Zinc 50 MG TABS Take 50 mg  by mouth daily.     Allergies:   Sulfa antibiotics, Amiodarone, Invokamet [canagliflozin-metformin hcl], Lovastatin, Penicillins, and Vicodin [hydrocodone-acetaminophen]   Social History   Socioeconomic History   Marital status: Married    Spouse name: Jonny Ruiz   Number of children: 5   Years of education: Not on file   Highest education level: Not on file  Occupational History   Occupation: retired   Tobacco Use   Smoking status: Former    Types: Cigarettes    Quit date: 02/15/1985    Years since quitting: 38.3   Smokeless tobacco: Never   Tobacco comments:    quit about 31 years ago  Vaping Use   Vaping Use: Never used  Substance and Sexual Activity   Alcohol use: No    Alcohol/week: 0.0 standard drinks of alcohol   Drug use: No   Sexual activity: Not on file  Other Topics Concern   Not on file  Social History Narrative   Lives at home independently.   Organ donor   Had 5 children- son timothy   Son died in 48   Social Determinants of Health   Financial Resource Strain: Low Risk  (05/31/2022)   Overall Financial Resource Strain (CARDIA)    Difficulty of Paying Living Expenses: Not hard at all  Food Insecurity: No Food Insecurity (03/14/2023)   Hunger Vital Sign    Worried About Running Out of Food in the Last Year: Never true    Ran Out of Food in the Last Year: Never true  Transportation Needs: No Transportation Needs (03/14/2023)   PRAPARE - Administrator, Civil Service (Medical): No    Lack of Transportation (Non-Medical): No  Physical Activity: Not on file  Stress: No Stress Concern Present (02/18/2022)   [employer] of Occupational Health - Occupational Stress Questionnaire    Feeling of Stress : Only a little  Social Connections: Socially Integrated (12/16/2021)   Social Connection and Isolation Panel [NHANES]    Frequency of Communication with Friends and Family: More than three times a week    Frequency of Social Gatherings with Friends  and Family: More than three times a week  Attends Religious Services: More than 4 times per year    Active Member of Clubs or Organizations: Yes    Attends Engineer, structural: More than 4 times per year    Marital Status: Married     Family History: The patient's family history includes Breast cancer (age of onset: 19) in her mother; COPD in her father; Diabetes in her father; Seizures in her father; Stroke in her father; Uterine cancer in her mother. There is no history of Colon cancer.  ROS:   Please see the history of present illness.     All other systems reviewed and are negative.  EKGs/Labs/Other Studies Reviewed:    The following studies were reviewed today:  Echo 03/2023  1. Left ventricular ejection fraction, by estimation, is 60 to 65%. The  left ventricle has normal function. The left ventricle has no regional  wall motion abnormalities. Left ventricular diastolic parameters are  consistent with Grade I diastolic  dysfunction (impaired relaxation).   2. Right ventricular systolic function is normal. The right ventricular  size is normal.   3. Left atrial size was moderately dilated.   4. The mitral valve is normal in structure. No evidence of mitral valve  regurgitation. No evidence of mitral stenosis. Moderate mitral annular  calcification.   5. The aortic valve has been repaired/replaced. Aortic valve  regurgitation is not visualized. No aortic stenosis is present. Aortic  valve area, by VTI measures 2.06 cm. Aortic valve mean gradient measures  4.3 mmHg.   6. The inferior vena cava is normal in size with greater than 50%  respiratory variability, suggesting right atrial pressure of 3 mmHg.   EKG:  EKG is not ordered today.  Recent Labs: 03/07/2023: ALT 6 03/08/2023: Magnesium 1.9 03/15/2023: BUN 28; Creatinine, Ser 1.07; Potassium 4.8; Sodium 131 05/31/2023: Hemoglobin 11.0; Platelet Count 199  Recent Lipid Panel    Component Value Date/Time   CHOL  188 04/18/2023 1209   TRIG 127 04/18/2023 1209   HDL 45 04/18/2023 1209   CHOLHDL 4.2 04/18/2023 1209   VLDL 25 04/18/2023 1209   LDLCALC 118 (H) 04/18/2023 1209   LDLDIRECT 133 (H) 04/18/2023 1209    Physical Exam:    VS:  BP (!) 150/74 (BP Location: Left Arm, Patient Position: Sitting, Cuff Size: Normal)   Pulse 67   Ht 5\' 3"  (1.6 m)   Wt 194 lb 3.2 oz (88.1 kg)   SpO2 95%   BMI 34.40 kg/m     Wt Readings from Last 3 Encounters:  06/21/23 194 lb 3.2 oz (88.1 kg)  05/31/23 199 lb (90.3 kg)  05/19/23 192 lb (87.1 kg)     GEN:  Well nourished, well developed in no acute distress HEENT: Normal NECK: No JVD; No carotid bruits LYMPHATICS: No lymphadenopathy CARDIAC: RRR, no murmurs, rubs, gallops RESPIRATORY:  Clear to auscultation without rales, wheezing or rhonchi  ABDOMEN: Soft, non-tender, non-distended MUSCULOSKELETAL:  No edema; No deformity  SKIN: Warm and dry NEUROLOGIC:  Alert and oriented x 3 PSYCHIATRIC:  Normal affect   ASSESSMENT:    1. Coronary artery disease of native artery of native heart with stable angina pectoris (HCC)   2. Severe aortic stenosis   3. S/P aortic valve replacement   4. Hyperlipidemia, mixed   5. Essential hypertension    PLAN:    In order of problems listed above:  CAD s/p CABG x 2 with LIMA to LAD and RSVG from the aorta to the diagonal coronary artery  History of prior stenting The patient underwent CABG x 2 with SAVR 03/02/23. She denies any chest pain. She previously had a mechanical fall and broke her left foot. She is now out of the cast and able to do cardiac rehab. Continue ASA 325mg  daily, Toprol 100mg  daily, Losartan 100mg  daily, and Imdur 30mg  daily. Refer to cardiac rehab.   Severe aortic stenosis s/p aortic valve replacement Repeat echo at 1 month showed LVEF 60-65%, G1DD, replaced aortic valve with a mean gradient 4.75mmHg. Patient appears euvolemic.  HLD She had repatha approved, but says the pharmacy is not  recognizing this. LDL 110 in 2022. H/o intolerance to statins.   HTN Continue Toprol 100mg  daily, Imdur 30mg  daily and Losartan 100mg  daily. BP is high today, but she didn't take all her medications. She says blood pressure at home is 110/73. H/o orthostatic symptoms.  Disposition: Follow up in 3 month(s) with MD/APP    Signed, Luca Burston David Stall, PA-C  06/21/2023 2:38 PM    [facility]

## 2023-06-21 NOTE — Patient Instructions (Signed)
Medication Instructions:  Your physician recommends that you continue on your current medications as directed. Please refer to the Current Medication list given to you today.   *If you need a refill on your cardiac medications before your next appointment, please call your pharmacy*   Lab Work: None ordered today   Testing/Procedures: None ordered today   Follow-Up: At [facility], you and your health needs are our priority.  As part of our continuing mission to provide you with exceptional heart care, we have created designated Provider Care Teams.  These Care Teams include your primary Cardiologist (physician) and Advanced Practice Providers (APPs -  Physician Assistants and Nurse Practitioners) who all work together to provide you with the care you need, when you need it.  We recommend signing up for the patient portal called "MyChart".  Sign up information is provided on this After Visit Summary.  MyChart is used to connect with patients for Virtual Visits (Telemedicine).  Patients are able to view lab/test results, encounter notes, upcoming appointments, etc.  Non-urgent messages can be sent to your provider as well.   To learn more about what you can do with MyChart, go to [email or web address].    Your next appointment:   3 month(s)  Provider:   You may see Julien Nordmann, MD or one of the following Advanced Practice Providers on your designated Care Team:   Nicolasa Ducking, NP Eula Listen, PA-C Cadence Fransico Michael, PA-C Charlsie Quest, NP

## 2023-06-28 ENCOUNTER — Telehealth: Payer: Self-pay | Admitting: Cardiovascular Disease

## 2023-06-28 ENCOUNTER — Other Ambulatory Visit: Payer: Self-pay | Admitting: *Deleted

## 2023-06-28 NOTE — Telephone Encounter (Signed)
  Pt c/o medication issue:  1. Name of Medication: Evolocumab (REPATHA SURECLICK) 140 MG/ML SOAJ   2. How are you currently taking this medication (dosage and times per day)? Inject 140 mg into the skin every 14 (fourteen) days.   3. Are you having a reaction (difficulty breathing--STAT)? No   4. What is your medication issue? Pt is requesting to speak with Belenda Cruise regarding this medication

## 2023-06-29 ENCOUNTER — Ambulatory Visit (INDEPENDENT_AMBULATORY_CARE_PROVIDER_SITE_OTHER): Payer: Medicare HMO

## 2023-06-29 ENCOUNTER — Encounter: Payer: Self-pay | Admitting: Podiatry

## 2023-06-29 ENCOUNTER — Ambulatory Visit (INDEPENDENT_AMBULATORY_CARE_PROVIDER_SITE_OTHER): Payer: Medicare HMO | Admitting: Podiatry

## 2023-06-29 DIAGNOSIS — S92355D Nondisplaced fracture of fifth metatarsal bone, left foot, subsequent encounter for fracture with routine healing: Secondary | ICD-10-CM

## 2023-06-29 NOTE — Progress Notes (Signed)
She presents today for follow-up of her fracture fifth metatarsal of her left foot she states that is feeling a whole lot better feeling very good she has not walked without her walker and without her cam boot.  She denies fever chills nausea vomit muscle aches pains calf pain back pain chest pain shortness of breath.  Objective: Vital signs stable oriented x 3 there is no erythema edema cellulitis drainage or odor she has no reproducible pain on palpation of the fifth metatarsal.  Radiographs taken today do demonstrate an osseously mature individual with generalized demineralized she does have a gap in the fifth metatarsal measuring at least a half a centimeter however she does have significant bridging that appears to be dorsal medial.  Assessment: Slowly healing fracture fifth met proximal diaphysis  Plan: Placed her in a Tri-Lock brace and she will remain in the Tri-Lock brace with that shoe at all times or her cam boot at all times.  She will be wearing one of the other and she was instructed not to be barefoot.  She understands this and is amenable to it we will follow-up with Dr. Lilian Kapur in 4 weeks for another set of x-rays.

## 2023-07-01 NOTE — Telephone Encounter (Signed)
Correction  - ID is [id number]

## 2023-07-04 NOTE — Telephone Encounter (Signed)
Spoke with patient - [facility] ID number had been transposed.  Corrected number given and she will take to pharmacy

## 2023-07-27 ENCOUNTER — Ambulatory Visit (INDEPENDENT_AMBULATORY_CARE_PROVIDER_SITE_OTHER): Payer: Medicare HMO

## 2023-07-27 ENCOUNTER — Ambulatory Visit (INDEPENDENT_AMBULATORY_CARE_PROVIDER_SITE_OTHER): Payer: Medicare HMO | Admitting: Podiatry

## 2023-07-27 ENCOUNTER — Encounter: Payer: Self-pay | Admitting: Podiatry

## 2023-07-27 DIAGNOSIS — S92355G Nondisplaced fracture of fifth metatarsal bone, left foot, subsequent encounter for fracture with delayed healing: Secondary | ICD-10-CM

## 2023-07-27 DIAGNOSIS — S92355D Nondisplaced fracture of fifth metatarsal bone, left foot, subsequent encounter for fracture with routine healing: Secondary | ICD-10-CM | POA: Diagnosis not present

## 2023-07-27 NOTE — Patient Instructions (Signed)
The type of shoes we discussed were called [employer]

## 2023-07-27 NOTE — Progress Notes (Signed)
  Subjective:  Patient ID: Joyce Potter, female    DOB: 18-Jul-1946,  MRN: [id number]  Chief Complaint  Patient presents with   Fracture    "It doesn't hurt until I walk on it with that boot (ankle stabilizer), it causes my heel to hurt."    77 y.o. female presents with the above complaint. History confirmed with patient.    Objective:  Physical Exam: warm, good capillary refill, no trophic changes or ulcerative lesions, normal DP and PT pulses, normal sensory exam, and today she has no pain to palpation, good 5 out of 5 strength  Radiographs: New left foot radiographs taken today show overall unchanged appearance of the fracture site with lucency still present  Assessment:   1. Closed nondisplaced fracture of fifth metatarsal bone of left foot with delayed healing, subsequent encounter      Plan:  Patient was evaluated and treated and all questions answered.  Still having persistent lucency at the fracture site with delayed healing.  I suspect she likely will proceed to a nonunion at 90 days.  May look into noninvasive bone marrow stimulation after this.  Fortunately he is asymptomatic from a pain standpoint otherwise would need to proceed with surgical revision but hopefully should be able to continue nonoperative treatment at this point.  She may continue using regular shoe gear I discussed with her that she can discontinue use of the Tri-Lock ankle brace as long as she is wearing a stiff soled supportive shoe, I recommended she use easy Spirit shoes   Return in about 4 weeks (around 08/24/2023) for fracture follow up (new xrays).

## 2023-08-02 ENCOUNTER — Other Ambulatory Visit: Payer: Self-pay | Admitting: Family Medicine

## 2023-08-02 DIAGNOSIS — I672 Cerebral atherosclerosis: Secondary | ICD-10-CM

## 2023-08-08 ENCOUNTER — Ambulatory Visit
Admission: RE | Admit: 2023-08-08 | Discharge: 2023-08-08 | Disposition: A | Discharge status: Home / Self Care | Payer: Medicare HMO | Source: Ambulatory Visit | Attending: Family Medicine | Admitting: Family Medicine

## 2023-08-08 DIAGNOSIS — Z1231 Encounter for screening mammogram for malignant neoplasm of breast: Secondary | ICD-10-CM | POA: Insufficient documentation

## 2023-08-12 ENCOUNTER — Ambulatory Visit
Admission: RE | Admit: 2023-08-12 | Discharge: 2023-08-12 | Disposition: A | Discharge status: Home / Self Care | Payer: Medicare HMO | Source: Ambulatory Visit | Attending: Family Medicine | Admitting: Family Medicine

## 2023-08-12 ENCOUNTER — Encounter: Payer: Self-pay | Admitting: Oncology

## 2023-08-12 DIAGNOSIS — I672 Cerebral atherosclerosis: Secondary | ICD-10-CM | POA: Insufficient documentation

## 2023-08-12 DIAGNOSIS — Z8673 Personal history of transient ischemic attack (TIA), and cerebral infarction without residual deficits: Secondary | ICD-10-CM | POA: Insufficient documentation

## 2023-08-17 ENCOUNTER — Encounter: Payer: Self-pay | Admitting: Podiatry

## 2023-08-17 ENCOUNTER — Other Ambulatory Visit: Payer: Self-pay | Admitting: Podiatry

## 2023-08-17 ENCOUNTER — Ambulatory Visit (INDEPENDENT_AMBULATORY_CARE_PROVIDER_SITE_OTHER): Payer: Medicare HMO

## 2023-08-17 ENCOUNTER — Ambulatory Visit (INDEPENDENT_AMBULATORY_CARE_PROVIDER_SITE_OTHER): Payer: Medicare HMO | Admitting: Podiatry

## 2023-08-17 DIAGNOSIS — S92355G Nondisplaced fracture of fifth metatarsal bone, left foot, subsequent encounter for fracture with delayed healing: Secondary | ICD-10-CM

## 2023-08-17 DIAGNOSIS — S92511A Displaced fracture of proximal phalanx of right lesser toe(s), initial encounter for closed fracture: Secondary | ICD-10-CM | POA: Diagnosis not present

## 2023-08-17 DIAGNOSIS — R609 Edema, unspecified: Secondary | ICD-10-CM | POA: Diagnosis not present

## 2023-08-17 NOTE — Progress Notes (Signed)
  Subjective:  Patient ID: Joyce Potter, female    DOB: 19-Mar-1946,  MRN: [id number]  Chief Complaint  Patient presents with   Foot Pain    "The physical therapist said I might have Gout in my foot.  It's swelling in the ankle and the whole foot." N - swelling L - right foot/ankle D - 1.5 weeks O - suddenly, gotten worse C - swelling, throb A - walking, standing T - heat   Fracture    "Can he check my left foot too while I'm here.     77 y.o. female presents with the above complaint. History confirmed with patient.  She returns for follow-up has a new issue of severe pain in the right fifth toe, does not recall any particular traumatic event or stubbing it but also has bruising of the second toe.  She thought maybe it was a gout flare developing.  Objective:  Physical Exam: warm, good capillary refill, no trophic changes or ulcerative lesions, normal DP and PT pulses, normal sensory exam, and today she has no pain to palpation on the left foot, good 5 out of 5 strength.  Right foot edematous, painful and erythematous fifth toe, bruising of second toe, no gross deformity.  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 Closed nondisplaced fracture of fifth metatarsal bone of left foot with delayed healing, subsequent encounter   2. Closed fracture of proximal phalanx of lesser toe of right foot, initial encounter      Plan:  Patient was evaluated and treated and all questions answered.  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  Referral was sent.  We will plan to do this for 2 to 3 months to see if this increases consolidation  across the fracture site, continues to be asymptomatic and do not think that surgical intervention is warranted at this point.  Will reevaluate this at the 33-month mark if it continues to be problematic.  She has a new contusion and fracture of the fifth toe proximal phalanx.  She does not recall exactly this happened but does have bruising across the dorsal forefoot.  I recommended nonoperative treatment with a surgical shoe, this was dispensed today.  WBAT in this.  RICE protocol reviewed recommended using Tylenol OTC and icing the toe if painful.  Return in about 8 weeks (around 10/12/2023) for fracture follow up (left foot xrays).

## 2023-08-31 ENCOUNTER — Ambulatory Visit: Payer: Medicare HMO | Admitting: Podiatry

## 2023-09-02 ENCOUNTER — Other Ambulatory Visit: Payer: Self-pay

## 2023-09-02 DIAGNOSIS — D509 Iron deficiency anemia, unspecified: Secondary | ICD-10-CM

## 2023-09-05 ENCOUNTER — Inpatient Hospital Stay: Payer: Medicare HMO | Attending: Oncology

## 2023-09-05 ENCOUNTER — Telehealth: Payer: Self-pay | Admitting: Pharmacist Clinician (PhC)/ Clinical Pharmacy Specialist

## 2023-09-05 DIAGNOSIS — M2550 Pain in unspecified joint: Secondary | ICD-10-CM | POA: Insufficient documentation

## 2023-09-05 DIAGNOSIS — D509 Iron deficiency anemia, unspecified: Secondary | ICD-10-CM

## 2023-09-05 DIAGNOSIS — Z862 Personal history of diseases of the blood and blood-forming organs and certain disorders involving the immune mechanism: Secondary | ICD-10-CM | POA: Insufficient documentation

## 2023-09-05 DIAGNOSIS — Z79899 Other long term (current) drug therapy: Secondary | ICD-10-CM | POA: Insufficient documentation

## 2023-09-05 DIAGNOSIS — I119 Hypertensive heart disease without heart failure: Secondary | ICD-10-CM | POA: Diagnosis not present

## 2023-09-05 DIAGNOSIS — M810 Age-related osteoporosis without current pathological fracture: Secondary | ICD-10-CM | POA: Insufficient documentation

## 2023-09-05 DIAGNOSIS — E782 Mixed hyperlipidemia: Secondary | ICD-10-CM

## 2023-09-05 LAB — CBC WITH DIFFERENTIAL (CANCER CENTER ONLY)
Abs Immature Granulocytes: 0.03 10*3/uL (ref 0.00–0.07)
Basophils Absolute: 0.1 10*3/uL (ref 0.0–0.1)
Basophils Relative: 1 %
Eosinophils Absolute: 0.6 10*3/uL — ABNORMAL HIGH (ref 0.0–0.5)
Eosinophils Relative: 9 %
HCT: 37.1 % (ref 36.0–46.0)
Hemoglobin: 12 g/dL (ref 12.0–15.0)
Immature Granulocytes: 1 %
Lymphocytes Relative: 24 %
Lymphs Abs: 1.5 10*3/uL (ref 0.7–4.0)
MCH: 29.3 pg (ref 26.0–34.0)
MCHC: 32.3 g/dL (ref 30.0–36.0)
MCV: 90.7 fL (ref 80.0–100.0)
Monocytes Absolute: 0.6 10*3/uL (ref 0.1–1.0)
Monocytes Relative: 10 %
Neutro Abs: 3.5 10*3/uL (ref 1.7–7.7)
Neutrophils Relative %: 55 %
Platelet Count: 298 10*3/uL (ref 150–400)
RBC: 4.09 MIL/uL (ref 3.87–5.11)
RDW: 12.7 % (ref 11.5–15.5)
WBC Count: 6.3 10*3/uL (ref 4.0–10.5)
nRBC: 0 % (ref 0.0–0.2)

## 2023-09-05 LAB — IRON AND TIBC
Iron: 64 ug/dL (ref 28–170)
Saturation Ratios: 26 % (ref 10.4–31.8)
TIBC: 246 ug/dL — ABNORMAL LOW (ref 250–450)
UIBC: 182 ug/dL

## 2023-09-05 LAB — FERRITIN: Ferritin: 97 ng/mL (ref 11–307)

## 2023-09-05 NOTE — Telephone Encounter (Signed)
Labs ordered - lipid/liver for Repatha

## 2023-09-06 ENCOUNTER — Inpatient Hospital Stay: Payer: Medicare HMO

## 2023-09-06 ENCOUNTER — Encounter: Payer: Self-pay | Admitting: Oncology

## 2023-09-06 ENCOUNTER — Inpatient Hospital Stay (HOSPITAL_BASED_OUTPATIENT_CLINIC_OR_DEPARTMENT_OTHER): Payer: Medicare HMO | Admitting: Oncology

## 2023-09-06 VITALS — BP 154/73 | HR 83 | Temp 98.3°F | Resp 18 | Ht 63.0 in | Wt 201.0 lb

## 2023-09-06 DIAGNOSIS — Z862 Personal history of diseases of the blood and blood-forming organs and certain disorders involving the immune mechanism: Secondary | ICD-10-CM | POA: Diagnosis not present

## 2023-09-06 DIAGNOSIS — D509 Iron deficiency anemia, unspecified: Secondary | ICD-10-CM | POA: Diagnosis not present

## 2023-09-06 NOTE — Progress Notes (Signed)
[facility]  [phone] [phone]  ID: Lenor Derrick OB: 1946-04-04  MR#: [id number]  QIO#:[id number]  Patient Care Team: Leanord Asal, Nelva Bush, MD as PCP - General (Family Medicine) [name], MD as PCP - Cardiology (Cardiology) Jeralyn Ruths, MD as Consulting Physician (Hematology and Oncology) Tedd Sias Marlana Salvage, MD as Physician Assistant (Endocrinology) Galen Manila, MD as Referring Physician (Ophthalmology)  CHIEF COMPLAINT: Iron deficiency anemia, secondary to chronic blood loss.  INTERVAL HISTORY: Patient returns to clinic today for repeat laboratory work and further evaluation.  She continues to have mild weakness and fatigue from her recent heart surgery, but otherwise feels well.  She has no neurologic complaints.  She has a good appetite and denies weight loss.  She denies any recent fevers or illnesses.  She denies chest pain, shortness of breath, cough or hemoptysis. She denies any nausea, vomiting, or diarrhea.  She has no melena or hematochezia.  She has no urinary complaints.  Patient offers no further specific complaints today.  REVIEW OF SYSTEMS:   Review of Systems  Constitutional:  Positive for malaise/fatigue. Negative for diaphoresis, fever and weight loss.  Eyes: Negative.  Negative for blurred vision.  Respiratory: Negative.  Negative for cough, hemoptysis, shortness of breath and wheezing.   Cardiovascular: Negative.  Negative for chest pain and leg swelling.  Gastrointestinal: Negative.  Negative for abdominal pain, blood in stool and melena.  Genitourinary: Negative.  Negative for flank pain and hematuria.  Musculoskeletal:  Positive for joint pain. Negative for falls.  Skin: Negative.  Negative for rash.  Neurological:  Positive for weakness. Negative for dizziness, focal weakness and headaches.  Psychiatric/Behavioral: Negative.  The patient is not nervous/anxious.     As per HPI. Otherwise, a  complete review of systems is negative.  PAST MEDICAL HISTORY: Past Medical History:  Diagnosis Date   ADHD (attention deficit hyperactivity disorder)    Anemia    iron deficiency   Anxiety    Aortic stenosis    Arthritis    Asthma    Basal cell carcinoma    CAD (coronary artery disease)    s/p Left circumflex stent in 2012   Carotid stenosis    Cataract    Heart murmur    High cholesterol    HTN (hypertension)    Hyperlipidemia    IDDM (insulin dependent diabetes mellitus)    Multiple thyroid nodules    Benign   Neuromuscular disorder (HCC)    Peptic ulcer disease    Pneumonia 1952   PONV (postoperative nausea and vomiting)    Retinal artery occlusion    on left   Sleep apnea    Stroke (HCC) 2018   ischemic    PAST SURGICAL HISTORY: Past Surgical History:  Procedure Laterality Date   ABDOMINAL HYSTERECTOMY     AORTIC VALVE REPLACEMENT N/A 03/02/2023   Procedure: AORTIC VALVE REPLACEMENT (AVR) USING EDWARDS INSPIRIS RESILIA 23 MM AORTIC VALVE;  Surgeon: Eugenio Hoes, MD;  Location: [facility] OR;  Service: Open Heart Surgery;  Laterality: N/A;   ARTERY BIOPSY Left 11/19/2015   Procedure: BIOPSY TEMPORAL ARTERY;  Surgeon: Annice Needy, MD;  Location: [facility] ORS;  Service: Vascular;  Laterality: Left;   BREAST BIOPSY Left 2002   core- neg   BREAST EXCISIONAL BIOPSY     ??? not visible scar on neither breast- pt believes it to be right breast    CARDIAC CATHETERIZATION N/A 08/08/2015   Procedure: Right and Left Heart Cath  and Coronary Angiography;  Surgeon: Dalia Heading, MD;  Location: [facility] INVASIVE CV LAB;  Service: Cardiovascular;  Laterality: N/A;   CARDIAC CATHETERIZATION Bilateral 09/03/2016   Procedure: Right/Left Heart Cath and Coronary Angiography;  Surgeon: Dalia Heading, MD;  Location: [facility] INVASIVE CV LAB;  Service: Cardiovascular;  Laterality: Bilateral;   CATARACT EXTRACTION W/ INTRAOCULAR LENS  IMPLANT, BILATERAL     COLONOSCOPY     in 2013- internal hemorrhoids    COLONOSCOPY WITH PROPOFOL N/A 07/07/2018   Procedure: COLONOSCOPY WITH PROPOFOL;  Surgeon: Scot Jun, MD;  Location: [facility]COPY;  Service: Endoscopy;  Laterality: N/A;   COLONOSCOPY WITH PROPOFOL N/A 05/22/2020   Procedure: COLONOSCOPY WITH PROPOFOL;  Surgeon: Wyline Mood, MD;  Location: [facility] ENDOSCOPY;  Service: Gastroenterology;  Laterality: N/A;   CORONARY ANGIOGRAPHY N/A 09/14/2017   Procedure: CORONARY ANGIOGRAPHY;  Surgeon: Dalia Heading, MD;  Location: [facility] INV[facility];  Service: Cardiovascular;  Laterality: N/A;   CORONARY ANGIOPLASTY     CORONARY ARTERY BYPASS GRAFT N/A 03/02/2023   Procedure: CORONARY ARTERY BYPASS GRAFTING (CABG) TIMES THREE USING THE LEFT INTERNAL MAMMARY ARTERY (LIMA) AND ENDOSCOPICALLY HARVESTED RIGHT GREATER SAPHENOUS VEIN;  Surgeon: Eugenio Hoes, MD;  Location: [facility] OR;  Service: Open Heart Surgery;  Laterality: N/A;   CORONARY STENT INTERVENTION N/A 05/15/2020   Procedure: coronary angiography;  Surgeon: Dalia Heading, MD;  Location: [facility] IN[facility];  Service: Cardiovascular;  Laterality: N/A;   CORONARY STENT INTERVENTION N/A 05/15/2020   Procedure: CORONARY STENT INTERVENTION;  Surgeon: Marcina Millard, MD;  Location: [facility];  Service: Cardiovascular;  Laterality: N/A;   CORONARY STENT PLACEMENT     ENDARTERECTOMY Left 10/01/2015   Procedure: ENDARTERECTOMY CAROTID;  Surgeon: Annice Needy, MD;  Location: [facility] ORS;  Service: Vascular;  Laterality: Left;   ENTEROSCOPY N/A 07/24/2020   Procedure: ENTEROSCOPY;  Surgeon: Pasty Spillers, MD;  Location: [facility] ENDOSCOPY;  Service: Endoscopy;  Laterality: N/A;   ESOPHAGOGASTRODUODENOSCOPY     ESOPHAGOGASTRODUODENOSCOPY (EGD) WITH PROPOFOL N/A 05/31/2016   Procedure: ESOPHAGOGASTRODUODENOSCOPY (EGD) WITH PROPOFOL;  Surgeon: Scot Jun, MD;  Location: [facility] ENDOSCOPY;  Service: Endoscopy;  Laterality: N/A;   ESOPHAGOGASTRODUODENOSCOPY (EGD) WITH PROPOFOL N/A 07/07/2018    Procedure: ESOPHAGOGASTRODUODENOSCOPY (EGD) WITH PROPOFOL;  Surgeon: Scot Jun, MD;  Location: [facility] ENDOSCOPY;  Service: Endoscopy;  Laterality: N/A;   ESOPHAGOGASTRODUODENOSCOPY (EGD) WITH PROPOFOL N/A 07/17/2018   Procedure: ESOPHAGOGASTRODUODENOSCOPY (EGD) WITH PROPOFOL;  Surgeon: Midge Minium, MD;  Location: [facility] ENDOSCOPY;  Service: Endoscopy;  Laterality: N/A;   ESOPHAGOGASTRODUODENOSCOPY (EGD) WITH PROPOFOL N/A 05/21/2020   Procedure: ESOPHAGOGASTRODUODENOSCOPY (EGD) WITH PROPOFOL;  Surgeon: Wyline Mood, MD;  Location: [facility] ENDOSCOPY;  Service: Gastroenterology;  Laterality: N/A;   ESOPHAGOGASTRODUODENOSCOPY (EGD) WITH PROPOFOL N/A 06/13/2020   Procedure: ESOPHAGOGASTRODUODENOSCOPY (EGD) WITH PROPOFOL;  Surgeon: Midge Minium, MD;  Location: [facility] ENDOSCOPY;  Service: Endoscopy;  Laterality: N/A;   EYE SURGERY     GIVENS CAPSULE STUDY N/A 04/17/2013   Procedure: GIVENS CAPSULE STUDY;  Surgeon: Willis Modena, MD;  Location: [facility] ENDOSCOPY;  Service: Endoscopy;  Laterality: N/A;  patient ate breakfast at 7am    GIVENS CAPSULE STUDY N/A 06/11/2020   Procedure: GIVENS CAPSULE STUDY;  Surgeon: Pasty Spillers, MD;  Location: [facility] ENDOSCOPY;  Service: Endoscopy;  Laterality: N/A;   GIVENS CAPSULE STUDY N/A 01/29/2021   Procedure: GIVENS CAPSULE STUDY;  Surgeon: Pasty Spillers, MD;  Location: [facility] ENDOSCOPY;  Service: Endoscopy;  Laterality: N/A;   GIVENS CAPSULE STUDY N/A  08/24/2021   Procedure: GIVENS CAPSULE STUDY;  Surgeon: Pasty Spillers, MD;  Location: [facility] ENDOSCOPY;  Service: Endoscopy;  Laterality: N/A;   HEMORRHOID BANDING  07/27/2016   Dr Malissa Hippo   LEFT HEART CATH AND CORONARY ANGIOGRAPHY N/A 07/08/2022   Procedure: LEFT HEART CATH AND CORONARY ANGIOGRAPHY;  Surgeon: Laurier Nancy, MD;  Location: [facility] INV[facility] CV LAB;  Service: Cardiovascular;  Laterality: N/A;   PARTIAL HYSTERECTOMY     RIGHT AND LEFT HEART CATH Bilateral 09/14/2017   Procedure: RIGHT AND LEFT HEART  CATH;  Surgeon: Dalia Heading, MD;  Location: [facility] CV LAB;  Service: Cardiovascular;  Laterality: Bilateral;   RIGHT HEART CATH AND CORONARY ANGIOGRAPHY N/A 01/26/2023   Procedure: RIGHT HEART CATH AND CORONARY ANGIOGRAPHY;  Surgeon: Orbie Pyo, MD;  Location: [facility] CV LAB;  Service: Cardiovascular;  Laterality: N/A;   RIGHT/LEFT HEART CATH AND CORONARY ANGIOGRAPHY Right 05/15/2020   Procedure: Right/Left Heart cath;  Surgeon: Dalia Heading, MD;  Location: [facility] LAB;  Service: Cardiovascular;  Laterality: Right;   TEE WITHOUT CARDIOVERSION N/A 03/02/2023   Procedure: TRANSESOPHAGEAL ECHOCARDIOGRAM;  Surgeon: Eugenio Hoes, MD;  Location: [facility] OR;  Service: Open Heart Surgery;  Laterality: N/A;   TRIGGER FINGER RELEASE      FAMILY HISTORY Family History  Problem Relation Age of Onset   Uterine cancer Mother    Breast cancer Mother 18   Seizures Father    Stroke Father    Diabetes Father    COPD Father    Colon cancer Neg Hx       ADVANCED DIRECTIVES:    HEALTH MAINTENANCE: Social History   Tobacco Use   Smoking status: Former    Current packs/day: 0.00    Types: Cigarettes    Quit date: 02/15/1985    Years since quitting: 38.5   Smokeless tobacco: Never   Tobacco comments:    quit about 31 years ago  Vaping Use   Vaping status: Never Used  Substance Use Topics   Alcohol use: No    Alcohol/week: 0.0 standard drinks of alcohol   Drug use: No    Colonoscopy:  PAP:  Bone density: Osteoporosis- DEXA 06/2017 (on fosamax)  Lipid panel:  Allergies  Allergen Reactions   Sulfa Antibiotics Other (See Comments)    Rash and flushing   Amiodarone Hives, Itching and Nausea Only   Invokamet [Canagliflozin-Metformin Hcl] Other (See Comments)    Yeast Infection   Lovastatin Rash   Penicillins Rash    Has patient had a PCN reaction causing immediate rash, facial/tongue/throat swelling, SOB or lightheadedness with hypotension:Yes Has patient had a PCN  reaction causing severe rash involving mucus membranes or skin necrosis:all over body Has patient had a PCN reaction that required hospitalization: No Has patient had a PCN reaction occurring within the last 10 years: No If all of the above answers are "NO", then may proceed with Cephalosporin use.    Vicodin [Hydrocodone-Acetaminophen] Rash    Current Outpatient Medications  Medication Sig Dispense Refill   albuterol (VENTOLIN HFA) 108 (90 Base) MCG/ACT inhaler Inhale 2 puffs into the lungs every 4 (four) hours as needed for wheezing or shortness of breath.      amitriptyline (ELAVIL) 25 MG tablet Take 25 mg by mouth at bedtime as needed for sleep.      Ascorbic Acid (VITAMIN C) 1000 MG tablet Take 1,000 mg by mouth daily.     aspirin EC 325 MG tablet Take  1 tablet (325 mg total) by mouth daily.     BD INSULIN SYRINGE U/F 31G X 5/16" 1 ML MISC      Dulaglutide 3 MG/0.5ML SOPN Inject 3 mg into the skin every Saturday.     Evolocumab (REPATHA SURECLICK) 140 MG/ML SOAJ Inject 140 mg into the skin every 14 (fourteen) days. 6 mL 3   fexofenadine (ALLEGRA ALLERGY) 180 MG tablet Take 180 mg by mouth as needed for allergies or rhinitis.     insulin NPH-regular Human (NOVOLIN 70/30) (70-30) 100 UNIT/ML injection Inject 30 Units into the skin with breakfast, with lunch, and with evening meal.     losartan (COZAAR) 100 MG tablet Take 1 tablet (100 mg total) by mouth daily. 90 tablet 3   metoprolol succinate (TOPROL-XL) 100 MG 24 hr tablet Take 1 tablet (100 mg total) by mouth daily. Take with or immediately following a meal.     Multiple Vitamin (MULTI-VITAMIN) tablet Take 1 tablet by mouth daily.     ondansetron (ZOFRAN-ODT) 4 MG disintegrating tablet Take by mouth as needed.     ONETOUCH VERIO test strip      pantoprazole (PROTONIX) 40 MG tablet Take 40 mg by mouth daily.     SPIRIVA HANDIHALER 18 MCG inhalation capsule Place 18 mcg into inhaler and inhale at bedtime.     Zinc 50 MG TABS Take 50 mg  by mouth daily.     isosorbide mononitrate (IMDUR) 30 MG 24 hr tablet Take 1 tablet (30 mg total) by mouth daily. 90 tablet 3   nitroGLYCERIN (NITROSTAT) 0.4 MG SL tablet Place 1 tablet (0.4 mg total) under the tongue every 5 (five) minutes as needed for chest pain. 90 tablet 3   sertraline (ZOLOFT) 25 MG tablet Take 1 tablet by mouth once daily (Patient not taking: Reported on 09/06/2023) 30 tablet 0   No current facility-administered medications for this visit.   Facility-Administered Medications Ordered in Other Visits  Medication Dose Route Frequency Provider Last Rate Last Admin   heparin lock flush 100 unit/mL  500 Units Intravenous Once Alinda Dooms, NP       sodium chloride flush (NS) 0.9 % injection 10 mL  10 mL Intravenous Once Alinda Dooms, NP       sodium chloride flush (NS) 0.9 % injection 3 mL  3 mL Intravenous Q12H Scoggins, Amber, NP        OBJECTIVE: Vitals:   09/06/23 1434  BP: (!) 154/73  Pulse: 83  Resp: 18  Temp: 98.3 F (36.8 C)  SpO2: 97%      Body mass index is 35.61 kg/m.    ECOG FS:0 - Asymptomatic  General: Well-developed, well-nourished, no acute distress. Eyes: Pink conjunctiva, anicteric sclera. HEENT: Normocephalic, moist mucous membranes. Lungs: No audible wheezing or coughing. Heart: Regular rate and rhythm. Abdomen: Soft, nontender, no obvious distention. Musculoskeletal: No edema, cyanosis, or clubbing. Neuro: Alert, answering all questions appropriately. Cranial nerves grossly intact. Skin: No rashes or petechiae noted. Psych: Normal affect.  LAB RESULTS:  Lab Results  Component Value Date   NA 131 (L) 03/15/2023   K 4.8 03/15/2023   CL 96 (L) 03/15/2023   CO2 24 03/15/2023   GLUCOSE 230 (H) 03/15/2023   BUN 28 (H) 03/15/2023   CREATININE 1.07 (H) 03/15/2023   CALCIUM 9.2 05/04/2023   PROT 5.6 (L) 03/07/2023   ALBUMIN 2.6 (L) 03/07/2023   AST 19 03/07/2023   ALT 6 03/07/2023   ALKPHOS 53 03/07/2023  BILITOT 1.3 (H)  03/07/2023   GFRNONAA 54 (L) 03/15/2023   GFRAA >60 07/24/2020    Lab Results  Component Value Date   WBC 6.3 09/05/2023   NEUTROABS 3.5 09/05/2023   HGB 12.0 09/05/2023   HCT 37.1 09/05/2023   MCV 90.7 09/05/2023   PLT 298 09/05/2023   Lab Results  Component Value Date   IRON 64 09/05/2023   TIBC 246 (L) 09/05/2023   IRONPCTSAT 26 09/05/2023    Lab Results  Component Value Date   FERRITIN 97 09/05/2023     STUDIES: CT HEAD WO CONTRAST ( )  Result Date: 08/23/2023 CLINICAL DATA:  Cerebral arteriosclerosis with history of previous stroke. Dizziness and balance issue. Pain on left side of head. EXAM: CT HEAD WITHOUT CONTRAST TECHNIQUE: Contiguous axial images were obtained from the base of the skull through the vertex without intravenous contrast. RADIATION DOSE REDUCTION: This exam was performed according to the departmental dose-optimization program which includes automated exposure control, adjustment of the mA and/or kV according to patient size and/or use of iterative reconstruction technique. COMPARISON:  Head CT 10/11/2018.  MRI brain 06/30/2021. FINDINGS: Brain: No acute hemorrhage. Unchanged mild chronic small-vessel disease. Cortical gray-white differentiation is otherwise preserved. Prominence of the ventricles and sulci within expected range for age. No hydrocephalus or extra-axial collection. No mass effect or midline shift. Vascular: No hyperdense vessel or unexpected calcification. Skull: No calvarial fracture or suspicious bone lesion. Skull base is unremarkable. Sinuses/Orbits: Mild mucosal disease in the paranasal sinuses. Orbits are unremarkable. Other: None. IMPRESSION: 1. No acute intracranial abnormality. 2. Unchanged mild chronic small-vessel disease. Electronically Signed   By: Orvan Falconer M.D.   On: 08/23/2023 09:56   DG Foot Complete Left  Result Date: 08/17/2023 Please see detailed radiograph report in office note.  DG Foot Complete Right  Result  Date: 08/17/2023 Please see detailed radiograph report in office note.  MM 3D SCREENING MAMMOGRAM BILATERAL BREAST  Result Date: 08/09/2023 CLINICAL DATA:  Screening. EXAM: DIGITAL SCREENING BILATERAL MAMMOGRAM WITH TOMOSYNTHESIS AND CAD TECHNIQUE: Bilateral screening digital craniocaudal and mediolateral oblique mammograms were obtained. Bilateral screening digital breast tomosynthesis was performed. The images were evaluated with computer-aided detection. COMPARISON:  Previous exam(s). ACR Breast Density Category b: There are scattered areas of fibroglandular density. FINDINGS: There are no findings suspicious for malignancy. IMPRESSION: No mammographic evidence of malignancy. A result letter of this screening mammogram will be mailed directly to the patient. RECOMMENDATION: Screening mammogram in one year. (Code:SM-B-01Y) BI-RADS CATEGORY  1: Negative. Electronically Signed   By: Harmon Pier M.D.   On: 08/09/2023 16:05    ASSESSMENT: Iron deficiency anemia, secondary to chronic blood loss.  PLAN:    Iron deficiency anemia: Resolved.  Patient's hemoglobin and iron stores are within normal limits.  Previously, all of her other laboratory work was either negative or within normal limits. Patient underwent capsule endoscopy in February 2022 that only revealed 1 nonbleeding lymphangiectasia.  Prior to that patient's most recent luminal evaluation was in July 2021. Bone marrow biopsy on February 15, 2017 did not reveal any significant pathology.  Patient does not require additional IV Venofer today.  She last received treatment on March 03, 2023.  Return to clinic in 4 months with repeat laboratory work, further evaluation, and consideration of additional treatment if needed. Cardiac disease: Continue follow-up with cardiac surgery as scheduled.   Hypertension: Chronic and unchanged.  Continue evaluation and treatment per primary care.  Joint pain: Patient reports she likely will need a  right knee  replacement in the near future.  Patient expressed understanding and was in agreement with this plan. She also understands that She can call clinic at any time with any questions, concerns, or complaints.    Jeralyn Ruths, MD 09/06/23 3:23 PM

## 2023-09-26 ENCOUNTER — Other Ambulatory Visit: Payer: Self-pay | Admitting: *Deleted

## 2023-09-26 DIAGNOSIS — E782 Mixed hyperlipidemia: Secondary | ICD-10-CM

## 2023-09-26 LAB — HEPATIC FUNCTION PANEL
ALT: 21 [IU]/L (ref 0–32)
AST: 20 [IU]/L (ref 0–40)
Albumin: 4.1 g/dL (ref 3.8–4.8)
Alkaline Phosphatase: 81 [IU]/L (ref 44–121)
Bilirubin Total: 0.4 mg/dL (ref 0.0–1.2)
Bilirubin, Direct: 0.13 mg/dL (ref 0.00–0.40)
Total Protein: 6.5 g/dL (ref 6.0–8.5)

## 2023-09-26 LAB — LIPID PANEL
Chol/HDL Ratio: 3.4 {ratio} (ref 0.0–4.4)
Cholesterol, Total: 160 mg/dL (ref 100–199)
HDL: 47 mg/dL (ref >39)
LDL Chol Calc (NIH): 79 mg/dL (ref 0–99)
Triglycerides: 202 mg/dL — ABNORMAL HIGH (ref 0–149)
VLDL Cholesterol Cal: 34 mg/dL (ref 5–40)

## 2023-09-26 NOTE — Progress Notes (Unsigned)
Cardiology Office Note  Date:  09/26/2023   ID:  Joyce Potter, Joyce Potter April 04, 1946, MRN [id number]  PCP:  Leanord Asal, Nelva Bush, MD   No chief complaint on file.   HPI:  Joyce Potter  is a 77 year old woman with past medical history of aortic valve stenosis Anemia on iron infusions, GI losses Former smoker Hypertension Who presents for follow-up of her severe aortic valve stenosis, chest pain, coronary disease  Last seen in clinic January 2024 Seen by one of our providers June 2024  echo in January 2024 showing EF of 60 to 65%  with severe stenosis with a mean gradient of 40.8 mmHg and a valve area of 0.79 cm.     Right and left heart cath in January 2024 showed proximal LAD 55% stenosis, sequential 95 to 90% stenosis in the D1, patent proximal RCA stent with minimal in-stent restenosis of 20% stenosis.  saw CVTS 02/21/2023 and it was felt was best served surgical revascularization and SAVR.   operating room on 03/02/2023 and underwent Coronary artery bypass x 2 with the LIMA to the LAD and RSVG from the aorta to the Diagonal coronary artery   She underwent aortic valve replacement utilizing 23 mm Inspiris Resilia valve.   Hospitalization was complicated with AKI, right-sided pleural effusion, hypotension requiring midodrine, post-op afib briefly on amiodarone, volume overload requiring diuresis, and anemia requiring 2 unit PRBCs. She went to a nursing home and was discharged home 2 weeks later.    The patient was last seen 05/19/23 and reported a mechanical fall resulting in left ankle fracture.     Cardiac catheterization July 2023, images pulled up and reviewed with her today   Prox RCA lesion is 30% stenosed.   1st Diag-1 lesion is 75% stenosed.   1st Diag-2 lesion is 80% stenosed.   Prox LAD lesion is 55% stenosed.   The left ventricular systolic function is normal.   The left ventricular ejection fraction is 55-65% by visual estimate.  Echocardiogram March  2023 NORMAL LEFT VENTRICULAR SYSTOLIC FUNCTION   WITH MILD LVH  NORMAL RIGHT VENTRICULAR SYSTOLIC FUNCTION  MILD VALVULAR REGURGITATION (See above)  MODERATE VALVULAR STENOSIS (See above)  ESTIMATED LVEF >55%  Aortic: MILD AI; MODERATE AS  AOV: SEVERELY THICKENED, PARTIALLY MOBILE LEAFLETS; AS MAX VELOCITY 4.58m/s; AVA 1.0cm^2  Mitral: TRIVIAL MR  Tricuspid: TRACE TR  Pulmonic: MILD PI  MILDLY DILATED ASCENDING AORTA MEASURING 3.3cm  Closest EF: >55% (Estimated)  LVH: MILD LVH  AVS: MODERATE AS  Aortic: MILD AR: 398.0 cm/sec peak vel       63.4 mmHg peak grad  37.2 mmHg mean grad     EKG personally reviewed by myself on todays visit Normal sinus rhythm rate 69 bpm nonspecific ST abnormality, unable to exclude old anterior MI  PMH:   has a past medical history of ADHD (attention deficit hyperactivity disorder), Anemia, Anxiety, Aortic stenosis, Arthritis, Asthma, Basal cell carcinoma, CAD (coronary artery disease), Carotid stenosis, Cataract, Heart murmur, High cholesterol, HTN (hypertension), Hyperlipidemia, IDDM (insulin dependent diabetes mellitus), Multiple thyroid nodules, Neuromuscular disorder (HCC), Peptic ulcer disease, Pneumonia (1952), PONV (postoperative nausea and vomiting), Retinal artery occlusion, Sleep apnea, and Stroke (HCC) (2018).  PSH:    Past Surgical History:  Procedure Laterality Date   ABDOMINAL HYSTERECTOMY     AORTIC VALVE REPLACEMENT N/A 03/02/2023   Procedure: AORTIC VALVE REPLACEMENT (AVR) USING EDWARDS INSPIRIS RESILIA 23 MM AORTIC VALVE;  Surgeon: Eugenio Hoes, MD;  [facility]: [facility];  Service: Open  Heart Surgery;  Laterality: N/A;   ARTERY BIOPSY Left 11/19/2015   Procedure: BIOPSY TEMPORAL ARTERY;  Surgeon: Annice Needy, MD;  Location: [facility] ORS;  Service: Vascular;  Laterality: Left;   BREAST BIOPSY Left 2002   core- neg   BREAST EXCISIONAL BIOPSY     ??? not visible scar on neither breast- pt believes it to be right breast    CARDIAC CATHETERIZATION  N/A 08/08/2015   Procedure: Right and Left Heart Cath and Coronary Angiography;  Surgeon: Dalia Heading, MD;  Location: [facility] INVASIVE CV LAB;  Service: Cardiovascular;  Laterality: N/A;   CARDIAC CATHETERIZATION Bilateral 09/03/2016   Procedure: Right/Left Heart Cath and Coronary Angiography;  Surgeon: Dalia Heading, MD;  Location: [facility];  Service: Cardiovascular;  Laterality: Bilateral;   CATARACT EXTRACTION W/ INTRAOCULAR LENS  IMPLANT, BILATERAL     COLONOSCOPY     in 2013- internal hemorrhoids   COLONOSCOPY WITH PROPOFOL N/A 07/07/2018   Procedure: COLONOSCOPY WITH PROPOFOL;  Surgeon: Scot Jun, MD;  Location: [facility] ENDOSCOPY;  Service: Endoscopy;  Laterality: N/A;   COLONOSCOPY WITH PROPOFOL N/A 05/22/2020   Procedure: COLONOSCOPY WITH PROPOFOL;  Surgeon: Wyline Mood, MD;  Location: [facility] ENDOSCOPY;  Service: Gastroenterology;  Laterality: N/A;   CORONARY ANGIOGRAPHY N/A 09/14/2017   Procedure: CORONARY ANGIOGRAPHY;  Surgeon: Dalia Heading, MD;  Location: [facility];  Service: Cardiovascular;  Laterality: N/A;   CORONARY ANGIOPLASTY     CORONARY ARTERY BYPASS GRAFT N/A 03/02/2023   Procedure: CORONARY ARTERY BYPASS GRAFTING (CABG) TIMES THREE USING THE LEFT INTERNAL MAMMARY ARTERY (LIMA) AND ENDOSCOPICALLY HARVESTED RIGHT GREATER SAPHENOUS VEIN;  Surgeon: Eugenio Hoes, MD;  Location: [facility];  Service: Open Heart Surgery;  Laterality: N/A;   CORONARY STENT INTERVENTION N/A 05/15/2020   Procedure: coronary angiography;  Surgeon: Dalia Heading, MD;  Location: [facility];  Service: Cardiovascular;  Laterality: N/A;   CORONARY STENT INTERVENTION N/A 05/15/2020   Procedure: CORONARY STENT INTERVENTION;  Surgeon: Marcina Millard, MD;  Location: [facility];  Service: Cardiovascular;  Laterality: N/A;   CORONARY STENT PLACEMENT     ENDARTERECTOMY Left 10/01/2015   Procedure: ENDARTERECTOMY CAROTID;  Surgeon: Annice Needy, MD;  Location: [facility] ORS;   Service: Vascular;  Laterality: Left;   ENTEROSCOPY N/A 07/24/2020   Procedure: ENTEROSCOPY;  Surgeon: Pasty Spillers, MD;  Location: [facility] ENDOSCOPY;  Service: Endoscopy;  Laterality: N/A;   ESOPHAGOGASTRODUODENOSCOPY     ESOPHAGOGASTRODUODENOSCOPY (EGD) WITH PROPOFOL N/A 05/31/2016   Procedure: ESOPHAGOGASTRODUODENOSCOPY (EGD) WITH PROPOFOL;  Surgeon: Scot Jun, MD;  Location: [facility] ENDOSCOPY;  Service: Endoscopy;  Laterality: N/A;   ESOPHAGOGASTRODUODENOSCOPY (EGD) WITH PROPOFOL N/A 07/07/2018   Procedure: ESOPHAGOGASTRODUODENOSCOPY (EGD) WITH PROPOFOL;  Surgeon: Scot Jun, MD;  Location: [facility] ENDOSCOPY;  Service: Endoscopy;  Laterality: N/A;   ESOPHAGOGASTRODUODENOSCOPY (EGD) WITH PROPOFOL N/A 07/17/2018   Procedure: ESOPHAGOGASTRODUODENOSCOPY (EGD) WITH PROPOFOL;  Surgeon: Midge Minium, MD;  Location: [facility] ENDOSCOPY;  Service: Endoscopy;  Laterality: N/A;   ESOPHAGOGASTRODUODENOSCOPY (EGD) WITH PROPOFOL N/A 05/21/2020   Procedure: ESOPHAGOGASTRODUODENOSCOPY (EGD) WITH PROPOFOL;  Surgeon: Wyline Mood, MD;  Location: [facility] ENDOSCOPY;  Service: Gastroenterology;  Laterality: N/A;   ESOPHAGOGASTRODUODENOSCOPY (EGD) WITH PROPOFOL N/A 06/13/2020   Procedure: ESOPHAGOGASTRODUODENOSCOPY (EGD) WITH PROPOFOL;  Surgeon: Midge Minium, MD;  Location: [facility] ENDOSCOPY;  Service: Endoscopy;  Laterality: N/A;   EYE SURGERY     GIVENS CAPSULE STUDY N/A 04/17/2013   Procedure: GIVENS CAPSULE STUDY;  Surgeon: Willis Modena, MD;  Location: [facility] ENDOSCOPY;  Service: Endoscopy;  Laterality: N/A;  patient ate breakfast at 7am    GIVENS CAPSULE STUDY N/A 06/11/2020   Procedure: GIVENS CAPSULE STUDY;  Surgeon: Pasty Spillers, MD;  Location: [facility] ENDOSCOPY;  Service: Endoscopy;  Laterality: N/A;   GIVENS CAPSULE STUDY N/A 01/29/2021   Procedure: GIVENS CAPSULE STUDY;  Surgeon: Pasty Spillers, MD;  Location: [facility] ENDOSCOPY;  Service: Endoscopy;  Laterality: N/A;   GIVENS CAPSULE STUDY N/A 08/24/2021    Procedure: GIVENS CAPSULE STUDY;  Surgeon: Pasty Spillers, MD;  Location: [facility] ENDOSCOPY;  Service: Endoscopy;  Laterality: N/A;   HEMORRHOID BANDING  07/27/2016   Dr Malissa Hippo   LEFT HEART CATH AND CORONARY ANGIOGRAPHY N/A 07/08/2022   Procedure: LEFT HEART CATH AND CORONARY ANGIOGRAPHY;  Surgeon: Laurier Nancy, MD;  Location: [facility] CV [facility];  Service: Cardiovascular;  Laterality: N/A;   PARTIAL HYSTERECTOMY     RIGHT AND LEFT HEART CATH Bilateral 09/14/2017   Procedure: RIGHT AND LEFT HEART CATH;  Surgeon: Dalia Heading, MD;  Location: [facility] LAB;  Service: Cardiovascular;  Laterality: Bilateral;   RIGHT HEART CATH AND CORONARY ANGIOGRAPHY N/A 01/26/2023   Procedure: RIGHT HEART CATH AND CORONARY ANGIOGRAPHY;  Surgeon: Orbie Pyo, MD;  [facility];  Service: Cardiovascular;  Laterality: N/A;   RIGHT/LEFT HEART CATH AND CORONARY ANGIOGRAPHY Right 05/15/2020   Procedure: Right/Left Heart cath;  Surgeon: Dalia Heading, MD;  Location: [facility];  Service: Cardiovascular;  Laterality: Right;   TEE WITHOUT CARDIOVERSION N/A 03/02/2023   Procedure: TRANSESOPHAGEAL ECHOCARDIOGRAM;  Surgeon: Eugenio Hoes, MD;  Location: [facility] OR;  Service: Open Heart Surgery;  Laterality: N/A;   TRIGGER FINGER RELEASE      Current Outpatient Medications  Medication Sig Dispense Refill   albuterol (VENTOLIN HFA) 108 (90 Base) MCG/ACT inhaler Inhale 2 puffs into the lungs every 4 (four) hours as needed for wheezing or shortness of breath.      amitriptyline (ELAVIL) 25 MG tablet Take 25 mg by mouth at bedtime as needed for sleep.      Ascorbic Acid (VITAMIN C) 1000 MG tablet Take 1,000 mg by mouth daily.     aspirin EC 325 MG tablet Take 1 tablet (325 mg total) by mouth daily.     BD INSULIN SYRINGE U/F 31G X 5/16" 1 ML MISC      Dulaglutide 3 MG/0.5ML SOPN Inject 3 mg into the skin every Saturday.     Evolocumab (REPATHA SURECLICK) 140 MG/ML SOAJ Inject 140 mg  into the skin every 14 (fourteen) days. 6 mL 3   fexofenadine (ALLEGRA ALLERGY) 180 MG tablet Take 180 mg by mouth as needed for allergies or rhinitis.     insulin NPH-regular Human (NOVOLIN 70/30) (70-30) 100 UNIT/ML injection Inject 30 Units into the skin with breakfast, with lunch, and with evening meal.     isosorbide mononitrate (IMDUR) 30 MG 24 hr tablet Take 1 tablet (30 mg total) by mouth daily. 90 tablet 3   losartan (COZAAR) 100 MG tablet Take 1 tablet (100 mg total) by mouth daily. 90 tablet 3   metoprolol succinate (TOPROL-XL) 100 MG 24 hr tablet Take 1 tablet (100 mg total) by mouth daily. Take with or immediately following a meal.     Multiple Vitamin (MULTI-VITAMIN) tablet Take 1 tablet by mouth daily.     nitroGLYCERIN (NITROSTAT) 0.4 MG SL tablet Place 1 tablet (0.4 mg total) under the tongue every  5 (five) minutes as needed for chest pain. 90 tablet 3   ondansetron (ZOFRAN-ODT) 4 MG disintegrating tablet Take by mouth as needed.     ONETOUCH VERIO test strip      pantoprazole (PROTONIX) 40 MG tablet Take 40 mg by mouth daily.     sertraline (ZOLOFT) 25 MG tablet Take 1 tablet by mouth once daily (Patient not taking: Reported on 09/06/2023) 30 tablet 0   SPIRIVA HANDIHALER 18 MCG inhalation capsule Place 18 mcg into inhaler and inhale at bedtime.     Zinc 50 MG TABS Take 50 mg by mouth daily.     No current facility-administered medications for this visit.   Facility-Administered Medications Ordered in Other Visits  Medication Dose Route Frequency Provider Last Rate Last Admin   heparin lock flush 100 unit/mL  500 Units Intravenous Once Alinda Dooms, NP       sodium chloride flush (NS) 0.9 % injection 10 mL  10 mL Intravenous Once Alinda Dooms, NP       sodium chloride flush (NS) 0.9 % injection 3 mL  3 mL Intravenous Q12H Scoggins, Amber, NP         Allergies:   Sulfa antibiotics, Amiodarone, Invokamet [canagliflozin-metformin hcl], Lovastatin, Penicillins, and Vicodin  [hydrocodone-acetaminophen]   Social History:  The patient  reports that she quit smoking about 38 years ago. Her smoking use included cigarettes. She has never used smokeless tobacco. She reports that she does not drink alcohol and does not use drugs.   Family History:   family history includes Breast cancer (age of onset: 32) in her mother; COPD in her father; Diabetes in her father; Seizures in her father; Stroke in her father; Uterine cancer in her mother.    Review of Systems: Review of Systems  Constitutional: Negative.   HENT: Negative.    Respiratory:  Positive for shortness of breath.   Cardiovascular:  Positive for chest pain.  Gastrointestinal: Negative.   Musculoskeletal: Negative.   Neurological: Negative.   Psychiatric/Behavioral: Negative.    All other systems reviewed and are negative.    PHYSICAL EXAM: VS:  There were no vitals taken for this visit. , BMI There is no height or weight on file to calculate BMI. GEN: Well nourished, well developed, in no acute distress HEENT: normal Neck: no JVD, carotid bruits, or masses Cardiac: RRR; 3/6 systolic ejection murmur right sternal border No rubs or gallops Respiratory:  clear to auscultation bilaterally, normal work of breathing GI: soft, nontender, nondistended, + BS MS: no deformity or atrophy Skin: warm and dry, no rash Neuro:  Strength and sensation are intact Psych: euthymic mood, full affect   Recent Labs: 03/07/2023: ALT 6 03/08/2023: Magnesium 1.9 03/15/2023: BUN 28; Creatinine, Ser 1.07; Potassium 4.8; Sodium 131 09/05/2023: Hemoglobin 12.0; Platelet Count 298    Lipid Panel Lab Results  Component Value Date   CHOL 188 04/18/2023   HDL 45 04/18/2023   LDLCALC 118 (H) 04/18/2023   TRIG 127 04/18/2023      Wt Readings from Last 3 Encounters:  09/06/23 201 lb (91.2 kg)  06/21/23 194 lb 3.2 oz (88.1 kg)  05/31/23 199 lb (90.3 kg)       ASSESSMENT AND PLAN:  Problem List Items Addressed This  Visit   None   Aortic valve stenosis Severe mean gradient, peak gradient, velocity noted March 2023 at [facility] Worsening anginal symptoms on discussion today Repeat echocardiogram has been ordered, referral placed to structural heart clinic for consideration  of TAVR at her request She is indicated she would prefer TAVR over CABG/AVR if possible Discussed with her that structural heart clinic would be the first place to start, though coronary intervention may be indicated  Coronary disease with stable angina Severe coronary disease on review of catheterization images July 2023 Will refer to structural heart clinic to determine if intervention is indicated, PCI versus CABG. underlying coronary disease with aortic valve stenosis likely contributing to exertional angina  Hyperlipidemia Reports that she is on a statin, having cramping in her legs Reports that pravastatin was changed to Lipitor 20 by endocrinology Recommend she take a statin holiday for several weeks to see if cramping resolves May need PCSK9 inhibitor if symptoms improve  Diabetes type 2 with complications A1c 6.5, followed by endocrinology  Anemia Reports having iron deficiency anemia, received numerous rounds of iron Hemoglobin 9.9 in December 2023 Iron level within normal range    Total encounter time more than 60 minutes  Greater than 50% was spent in counseling and coordination of care with the patient    Signed, Dossie Arbour, M.D., Ph.D. [facility] [place], [place] [phone]

## 2023-09-27 ENCOUNTER — Encounter: Payer: Self-pay | Admitting: Cardiovascular Disease

## 2023-09-27 ENCOUNTER — Ambulatory Visit: Payer: Medicare HMO | Attending: Cardiovascular Disease | Admitting: Cardiovascular Disease

## 2023-09-27 VITALS — BP 160/84 | HR 63 | Ht 63.0 in | Wt 207.1 lb

## 2023-09-27 DIAGNOSIS — I25118 Atherosclerotic heart disease of native coronary artery with other forms of angina pectoris: Secondary | ICD-10-CM | POA: Diagnosis not present

## 2023-09-27 DIAGNOSIS — Z951 Presence of aortocoronary bypass graft: Secondary | ICD-10-CM

## 2023-09-27 DIAGNOSIS — E782 Mixed hyperlipidemia: Secondary | ICD-10-CM

## 2023-09-27 DIAGNOSIS — I10 Essential (primary) hypertension: Secondary | ICD-10-CM

## 2023-09-27 DIAGNOSIS — E118 Type 2 diabetes mellitus with unspecified complications: Secondary | ICD-10-CM

## 2023-09-27 DIAGNOSIS — I739 Peripheral vascular disease, unspecified: Secondary | ICD-10-CM

## 2023-09-27 DIAGNOSIS — Z794 Long term (current) use of insulin: Secondary | ICD-10-CM

## 2023-09-27 DIAGNOSIS — I700 Atherosclerosis of aorta: Secondary | ICD-10-CM

## 2023-09-27 DIAGNOSIS — I350 Nonrheumatic aortic (valve) stenosis: Secondary | ICD-10-CM

## 2023-09-27 DIAGNOSIS — Z79899 Other long term (current) drug therapy: Secondary | ICD-10-CM

## 2023-09-27 DIAGNOSIS — Z952 Presence of prosthetic heart valve: Secondary | ICD-10-CM

## 2023-09-27 MED ORDER — POTASSIUM CHLORIDE ER 10 MEQ PO TBCR: 10.0000 meq | EXTENDED_RELEASE_TABLET | Freq: Every day | ORAL | 3 refills | Status: AC

## 2023-09-27 MED ORDER — ASPIRIN 81 MG PO TBEC: 81.0000 mg | DELAYED_RELEASE_TABLET | Freq: Every day | ORAL | Status: AC

## 2023-09-27 MED ORDER — CLINDAMYCIN HCL 300 MG PO CAPS: 600.0000 mg | ORAL_CAPSULE | Freq: Once | ORAL | 3 refills | Status: AC

## 2023-09-27 MED ORDER — FUROSEMIDE 20 MG PO TABS
20.0000 mg | ORAL_TABLET | Freq: Every day | ORAL | 3 refills | Status: DC
Start: 2023-09-27 — End: 2024-10-30

## 2023-09-27 NOTE — Patient Instructions (Addendum)
Medication Instructions:  Please start lasix 20 mg daily with potassium 10 meq daily  Aspirin down to 81 mg daily  Move a few BP pills to the evening (metoprolol/imdur)  If you need a refill on your cardiac medications before your next appointment, please call your pharmacy.   Lab work: BMP and BNP  Testing/Procedures: No new testing needed  Follow-Up: At [facility], you and your health needs are our priority.  As part of our continuing mission to provide you with exceptional heart care, we have created designated Provider Care Teams.  These Care Teams include your primary Cardiologist (physician) and Advanced Practice Providers (APPs -  Physician Assistants and Nurse Practitioners) who all work together to provide you with the care you need, when you need it.  You will need a follow up appointment in 2 months  Providers on your designated Care Team:   Nicolasa Ducking, NP Eula Listen, PA-C Cadence Fransico Michael, [place]  COVID-19 Vaccine Information can be found at: [email or web address] For questions related to vaccine distribution or appointments, please email [email or web address] or call [phone].

## 2023-09-28 LAB — BASIC METABOLIC PANEL
BUN/Creatinine Ratio: 23 (ref 12–28)
BUN: 20 mg/dL (ref 8–27)
CO2: 25 mmol/L (ref 20–29)
Calcium: 9.3 mg/dL (ref 8.7–10.3)
Chloride: 106 mmol/L (ref 96–106)
Creatinine, Ser: 0.86 mg/dL (ref 0.57–1.00)
Glucose: 162 mg/dL — ABNORMAL HIGH (ref 70–99)
Potassium: 4.9 mmol/L (ref 3.5–5.2)
Sodium: 143 mmol/L (ref 134–144)
eGFR: 70 mL/min/{1.73_m2} (ref >59)

## 2023-09-28 LAB — BRAIN NATRIURETIC PEPTIDE: BNP: 82 pg/mL (ref 0.0–100.0)

## 2023-10-05 ENCOUNTER — Encounter: Payer: Self-pay | Admitting: Emergency Medicine

## 2023-10-11 ENCOUNTER — Telehealth: Payer: Self-pay | Admitting: Emergency Medicine

## 2023-10-11 MED ORDER — EZETIMIBE 10 MG PO TABS: 10.0000 mg | ORAL_TABLET | Freq: Every day | ORAL | 3 refills | Status: DC

## 2023-10-11 NOTE — Telephone Encounter (Signed)
-----   Message from Julien Nordmann sent at 10/08/2023 10:08 PM EDT ----- Cholesterol above goal Currently on Repatha Would recommend adding Zetia 10 mg daily

## 2023-10-11 NOTE — Telephone Encounter (Signed)
Called patient and notified her of the following from Dr. Mariah Milling.  Cholesterol above goal Currently on Repatha Would recommend adding Zetia 10 mg daily  Patient verbalizes understanding. Prescription sent to preferred pharmacy.

## 2023-10-12 ENCOUNTER — Ambulatory Visit: Payer: Medicare HMO | Admitting: Podiatry

## 2023-10-12 ENCOUNTER — Ambulatory Visit (INDEPENDENT_AMBULATORY_CARE_PROVIDER_SITE_OTHER): Payer: Medicare HMO

## 2023-10-12 ENCOUNTER — Encounter: Payer: Self-pay | Admitting: Podiatry

## 2023-10-12 VITALS — BP 188/77 | HR 68

## 2023-10-12 DIAGNOSIS — S92355D Nondisplaced fracture of fifth metatarsal bone, left foot, subsequent encounter for fracture with routine healing: Secondary | ICD-10-CM | POA: Diagnosis not present

## 2023-10-12 DIAGNOSIS — S92355G Nondisplaced fracture of fifth metatarsal bone, left foot, subsequent encounter for fracture with delayed healing: Secondary | ICD-10-CM

## 2023-10-12 NOTE — Progress Notes (Signed)
Subjective:  Patient ID: CAMRI CERULLI, female    DOB: May 25, 1946,  MRN: [id number]  Chief Complaint  Patient presents with   Fracture    "It's not hurting."    77 y.o. female presents with the above complaint. History confirmed with patient.  She returns for follow-up she received the bone stimulator and has been using it every night  Objective:  Physical Exam: warm, good capillary refill, no trophic changes or ulcerative lesions, normal DP and PT pulses, normal sensory exam, and today she has no pain to palpation on the left foot, good 5 out of 5 strength.  Some edema noted  Radiographs: New left foot radiographs taken today show increased consolidation across fracture site approximately 70 to 80% healed at this point  Assessment:   1. Closed nondisplaced fracture of fifth metatarsal bone of left foot with delayed healing, subsequent encounter      Plan:  Patient was evaluated and treated and all questions answered.  She is doing well and responding to bone marrow stimulation.  She should continue to utilize this nightly until the fracture consolidates.  I would like to see her in 3 months for new radiographs she may continue regular shoe gear and activity as she is fairly low demand right now.  I will see her back in January or sooner if there are any issues such as bruising severe pain or new deformity.  No follow-ups on file.

## 2023-10-19 ENCOUNTER — Telehealth: Payer: Self-pay | Admitting: Cardiovascular Disease

## 2023-10-19 NOTE — Telephone Encounter (Signed)
Patient is requesting call back to discuss labs. She states she received VM.

## 2023-10-19 NOTE — Telephone Encounter (Signed)
Called patient, spoke with her and daughter. Advised I did not see any new messages or lab work that was not already provided to patient. Last message on 10/15 was adding Zetia 10 mg daily along with Repatha.   Patient and daughter verbalized understanding.

## 2023-11-02 ENCOUNTER — Encounter: Payer: Self-pay | Admitting: Podiatry

## 2023-11-02 ENCOUNTER — Ambulatory Visit (INDEPENDENT_AMBULATORY_CARE_PROVIDER_SITE_OTHER): Payer: Medicare HMO | Admitting: Podiatry

## 2023-11-02 ENCOUNTER — Ambulatory Visit: Payer: Medicare HMO

## 2023-11-02 VITALS — Ht 63.0 in | Wt 207.1 lb

## 2023-11-02 DIAGNOSIS — S92355G Nondisplaced fracture of fifth metatarsal bone, left foot, subsequent encounter for fracture with delayed healing: Secondary | ICD-10-CM

## 2023-11-02 NOTE — Progress Notes (Signed)
  Subjective:  Patient ID: Joyce Potter, female    DOB: 1946-09-11,  MRN: [id number]  Chief Complaint  Patient presents with   Foot Pain    F/U with left foot pain, pt states no pain at all.    77 y.o. female presents with the above complaint. History confirmed with patient.  She returns for follow-up she has still been using the bone stimulator nightly  Objective:  Physical Exam: warm, good capillary refill, no trophic changes or ulcerative lesions, normal DP and PT pulses, normal sensory exam, and today she has no pain to palpation on the left foot, good 5 out of 5 strength.  No edema today  Radiographs: New films taken today show increasing consolidation still some gap plantar and lateral  Assessment:   1. Closed nondisplaced fracture of fifth metatarsal bone of left foot with delayed healing, subsequent encounter      Plan:  Patient was evaluated and treated and all questions answered.  Continue to improve she should continue utilizing the noninvasive bone stimulator and may continue regular shoe gear as tolerated.  I will see her back in 3 months for final follow-up.  Return in about 3 months (around 02/02/2024) for fracture follow up left foot.

## 2023-11-22 ENCOUNTER — Encounter: Payer: Self-pay | Admitting: Oncology

## 2023-11-27 NOTE — Progress Notes (Unsigned)
Cardiology Office Note  Date:  11/28/2023   ID:  Potter, Joyce 12/12/1946, MRN [id number]  PCP:  Leanord Asal, Nelva Bush, MD   Chief Complaint  Patient presents with   2 month follow up     Patient c/o weakness, chest discomfort, racing heart beats and shortness of breath. Medications reviewed by the patient verbally.     HPI:  [name]  is a 77 year old woman with past medical history of aortic valve stenosis Anemia on iron infusions, GI losses Former smoker Hypertension Coronary artery disease status post CABG, AVR March 2024 Who presents for follow-up of her severe aortic valve stenosis, chest pain, coronary disease  Last seen in clinic October 2024 Reports having issues with hemorrhoids and nose bleeds, feels she might be anemic again Chronic fatigue, falling asleep, tired all the time Husband died, 2023/12/01, dementia, lymphoma, fell, broke back/hematoma Feels more depressed Sleeps well  Has been staying busy, has been picking up house, going to do the floors  Has a cat to keep her company  Denies chest pain or shortness of breath concerning for angina  Lab work reviewed  A1c 6.6 Total cholesterol 160 LDL 79  EKG personally reviewed by myself on todays visit EKG Interpretation Date/Time:  Monday November 28 2023 10:05:12 EST Ventricular Rate:  66 PR Interval:  172 QRS Duration:  76 QT Interval:  382 QTC Calculation: 400 R Axis:   29  Text Interpretation: Normal sinus rhythm Anteroseptal infarct (cited on or before 27-Sep-2023) ST & T wave abnormality, consider lateral ischemia When compared with ECG of 27-Sep-2023 13:50, T wave inversion now evident in Lateral leads Confirmed by Julien Nordmann [phone]) on 11/28/2023 10:27:49 AM   Other past medical history reviewed echo in January 2024 showing EF of 60 to 65%  with severe stenosis with a mean gradient of 40.8 mmHg and a valve area of 0.79 cm.     Right and left heart cath in January 2024  showed proximal LAD 55% stenosis, sequential 95 to 90% stenosis in the D1, patent proximal RCA stent with minimal in-stent restenosis of 20% stenosis.  saw CVTS 02/21/2023 and it was felt was best served surgical revascularization and SAVR.   operating room on 03/02/2023 and underwent Coronary artery bypass x 2 with the LIMA to the LAD and RSVG from the aorta to the Diagonal coronary artery   She underwent aortic valve replacement utilizing 23 mm Inspiris Resilia valve.   Hospitalization was complicated with AKI, right-sided pleural effusion, hypotension requiring midodrine, post-op afib briefly on amiodarone, volume overload requiring diuresis, and anemia requiring 2 unit PRBCs. She went to a nursing home and was discharged home 2 weeks later.     EKG personally reviewed by myself on todays visit EKG Interpretation Date/Time:  Monday November 28 2023 10:05:12 EST Ventricular Rate:  66 PR Interval:  172 QRS Duration:  76 QT Interval:  382 QTC Calculation: 400 R Axis:   29  Text Interpretation: Normal sinus rhythm Anteroseptal infarct (cited on or before 27-Sep-2023) ST & T wave abnormality, consider lateral ischemia When compared with ECG of 27-Sep-2023 13:50, T wave inversion now evident in Lateral leads Confirmed by Julien Nordmann ([id number]) on 11/28/2023 10:27:49 AM     Cardiac catheterization July 2023, images pulled up and reviewed with her today   Prox RCA lesion is 30% stenosed.   1st Diag-1 lesion is 75% stenosed.   1st Diag-2 lesion is 80% stenosed.   Prox LAD lesion is 55%  stenosed.   The left ventricular systolic function is normal.   The left ventricular ejection fraction is 55-65% by visual estimate.  Echocardiogram March 2023 NORMAL LEFT VENTRICULAR SYSTOLIC FUNCTION   WITH MILD LVH  NORMAL RIGHT VENTRICULAR SYSTOLIC FUNCTION  MILD VALVULAR REGURGITATION (See above)  MODERATE VALVULAR STENOSIS (See above)  ESTIMATED LVEF >55%  Aortic: MILD AI; MODERATE AS  AOV: SEVERELY  THICKENED, PARTIALLY MOBILE LEAFLETS; AS MAX VELOCITY 4.71m/s; AVA 1.0cm^2  Mitral: TRIVIAL MR  Tricuspid: TRACE TR  Pulmonic: MILD PI  MILDLY DILATED ASCENDING AORTA MEASURING 3.3cm  Closest EF: >55% (Estimated)  LVH: MILD LVH  AVS: MODERATE AS  Aortic: MILD AR: 398.0 cm/sec peak vel       63.4 mmHg peak grad  37.2 mmHg mean grad      PMH:   has a past medical history of ADHD (attention deficit hyperactivity disorder), Anemia, Anxiety, Aortic stenosis, Arthritis, Asthma, Basal cell carcinoma, CAD (coronary artery disease), Carotid stenosis, Cataract, Heart murmur, High cholesterol, HTN (hypertension), Hyperlipidemia, IDDM (insulin dependent diabetes mellitus), Multiple thyroid nodules, Neuromuscular disorder (HCC), Peptic ulcer disease, Pneumonia (1952), PONV (postoperative nausea and vomiting), Retinal artery occlusion, Sleep apnea, and Stroke (HCC) (2018).  PSH:    Past Surgical History:  Procedure Laterality Date   ABDOMINAL HYSTERECTOMY     AORTIC VALVE REPLACEMENT N/A 03/02/2023   Procedure: AORTIC VALVE REPLACEMENT (AVR) USING EDWARDS INSPIRIS RESILIA 23 MM AORTIC VALVE;  Surgeon: Eugenio Hoes, MD;  Location: [facility];  Service: Open Heart Surgery;  Laterality: N/A;   ARTERY BIOPSY Left 11/19/2015   Procedure: BIOPSY TEMPORAL ARTERY;  Surgeon: Annice Needy, MD;  Location: [facility] ORS;  Service: Vascular;  Laterality: Left;   BREAST BIOPSY Left 2002   core- neg   BREAST EXCISIONAL BIOPSY     ??? not visible scar on neither breast- pt believes it to be right breast    CARDIAC CATHETERIZATION N/A 08/08/2015   Procedure: Right and Left Heart Cath and Coronary Angiography;  Surgeon: Dalia Heading, MD;  Location: [facility];  Service: Cardiovascular;  Laterality: N/A;   CARDIAC CATHETERIZATION Bilateral 09/03/2016   Procedure: Right/Left Heart Cath and Coronary Angiography;  Surgeon: Dalia Heading, MD;  Location: [facility];  Service: Cardiovascular;  Laterality: Bilateral;    CATARACT EXTRACTION W/ INTRAOCULAR LENS  IMPLANT, BILATERAL     COLONOSCOPY     in 2013- internal hemorrhoids   COLONOSCOPY WITH PROPOFOL N/A 07/07/2018   Procedure: COLONOSCOPY WITH PROPOFOL;  Surgeon: Scot Jun, MD;  Location: [facility] ENDOSCOPY;  Service: Endoscopy;  Laterality: N/A;   COLONOSCOPY WITH PROPOFOL N/A 05/22/2020   Procedure: COLONOSCOPY WITH PROPOFOL;  Surgeon: Wyline Mood, MD;  Location: [facility] ENDOSCOPY;  Service: Gastroenterology;  Laterality: N/A;   CORONARY ANGIOGRAPHY N/A 09/14/2017   Procedure: CORONARY ANGIOGRAPHY;  Surgeon: Dalia Heading, MD;  Location: [facility];  Service: Cardiovascular;  Laterality: N/A;   CORONARY ANGIOPLASTY     CORONARY ARTERY BYPASS GRAFT N/A 03/02/2023   Procedure: CORONARY ARTERY BYPASS GRAFTING (CABG) TIMES THREE USING THE LEFT INTERNAL MAMMARY ARTERY (LIMA) AND ENDOSCOPICALLY HARVESTED RIGHT GREATER SAPHENOUS VEIN;  Surgeon: Eugenio Hoes, MD;  Location: [facility] OR;  Service: Open Heart Surgery;  Laterality: N/A;   CORONARY STENT INTERVENTION N/A 05/15/2020   Procedure: coronary angiography;  Surgeon: Dalia Heading, MD;  Location: [facility];  Service: Cardiovascular;  Laterality: N/A;   CORONARY STENT INTERVENTION N/A 05/15/2020   Procedure: CORONARY STENT INTERVENTION;  Surgeon: Marcina Millard, MD;  Location: [facility];  Service: Cardiovascular;  Laterality: N/A;   CORONARY STENT PLACEMENT     ENDARTERECTOMY Left 10/01/2015   Procedure: ENDARTERECTOMY CAROTID;  Surgeon: Annice Needy, MD;  Location: [facility] ORS;  Service: Vascular;  Laterality: Left;   ENTEROSCOPY N/A 07/24/2020   Procedure: ENTEROSCOPY;  Surgeon: Pasty Spillers, MD;  Location: [facility] ENDOSCOPY;  Service: Endoscopy;  Laterality: N/A;   ESOPHAGOGASTRODUODENOSCOPY     ESOPHAGOGASTRODUODENOSCOPY (EGD) WITH PROPOFOL N/A 05/31/2016   Procedure: ESOPHAGOGASTRODUODENOSCOPY (EGD) WITH PROPOFOL;  Surgeon: Scot Jun, MD;  Location: [facility]  ENDOSCOPY;  Service: Endoscopy;  Laterality: N/A;   ESOPHAGOGASTRODUODENOSCOPY (EGD) WITH PROPOFOL N/A 07/07/2018   Procedure: ESOPHAGOGASTRODUODENOSCOPY (EGD) WITH PROPOFOL;  Surgeon: Scot Jun, MD;  Location: [facility] ENDOSCOPY;  Service: Endoscopy;  Laterality: N/A;   ESOPHAGOGASTRODUODENOSCOPY (EGD) WITH PROPOFOL N/A 07/17/2018   Procedure: ESOPHAGOGASTRODUODENOSCOPY (EGD) WITH PROPOFOL;  Surgeon: Midge Minium, MD;  Location: [facility] ENDOSCOPY;  Service: Endoscopy;  Laterality: N/A;   ESOPHAGOGASTRODUODENOSCOPY (EGD) WITH PROPOFOL N/A 05/21/2020   Procedure: ESOPHAGOGASTRODUODENOSCOPY (EGD) WITH PROPOFOL;  Surgeon: Wyline Mood, MD;  Location: [facility] ENDOSCOPY;  Service: Gastroenterology;  Laterality: N/A;   ESOPHAGOGASTRODUODENOSCOPY (EGD) WITH PROPOFOL N/A 06/13/2020   Procedure: ESOPHAGOGASTRODUODENOSCOPY (EGD) WITH PROPOFOL;  Surgeon: Midge Minium, MD;  Location: [facility] ENDOSCOPY;  Service: Endoscopy;  Laterality: N/A;   EYE SURGERY     GIVENS CAPSULE STUDY N/A 04/17/2013   Procedure: GIVENS CAPSULE STUDY;  Surgeon: Willis Modena, MD;  Location: [facility] ENDOSCOPY;  Service: Endoscopy;  Laterality: N/A;  patient ate breakfast at 7am    GIVENS CAPSULE STUDY N/A 06/11/2020   Procedure: GIVENS CAPSULE STUDY;  Surgeon: Pasty Spillers, MD;  Location: [facility] ENDOSCOPY;  Service: Endoscopy;  Laterality: N/A;   GIVENS CAPSULE STUDY N/A 01/29/2021   Procedure: GIVENS CAPSULE STUDY;  Surgeon: Pasty Spillers, MD;  Location: [facility] ENDOSCOPY;  Service: Endoscopy;  Laterality: N/A;   GIVENS CAPSULE STUDY N/A 08/24/2021   Procedure: GIVENS CAPSULE STUDY;  Surgeon: Pasty Spillers, MD;  Location: [facility] ENDOSCOPY;  Service: Endoscopy;  Laterality: N/A;   HEMORRHOID BANDING  07/27/2016   Dr Malissa Hippo   LEFT HEART CATH AND CORONARY ANGIOGRAPHY N/A 07/08/2022   Procedure: LEFT HEART CATH AND CORONARY ANGIOGRAPHY;  Surgeon: Laurier Nancy, MD;  Location: [facility];  Service: Cardiovascular;  Laterality:  N/A;   PARTIAL HYSTERECTOMY     RIGHT AND LEFT HEART CATH Bilateral 09/14/2017   Procedure: RIGHT AND LEFT HEART CATH;  Surgeon: Dalia Heading, MD;  Location: [facility];  Service: Cardiovascular;  Laterality: Bilateral;   RIGHT HEART CATH AND CORONARY ANGIOGRAPHY N/A 01/26/2023   Procedure: RIGHT HEART CATH AND CORONARY ANGIOGRAPHY;  Surgeon: Orbie Pyo, MD;  Location: [facility];  Service: Cardiovascular;  Laterality: N/A;   RIGHT/LEFT HEART CATH AND CORONARY ANGIOGRAPHY Right 05/15/2020   Procedure: Right/Left Heart cath;  Surgeon: Dalia Heading, MD;  Location: [facility];  Service: Cardiovascular;  Laterality: Right;   TEE WITHOUT CARDIOVERSION N/A 03/02/2023   Procedure: TRANSESOPHAGEAL ECHOCARDIOGRAM;  Surgeon: Eugenio Hoes, MD;  Location[facility] OR;  Service: Open Heart Surgery;  Laterality: N/A;   TRIGGER FINGER RELEASE      Current Outpatient Medications  Medication Sig Dispense Refill   albuterol (VENTOLIN HFA) 108 (90 Base) MCG/ACT inhaler Inhale 2 puffs into the lungs every 4 (four) hours as needed for wheezing or shortness of breath.  amitriptyline (ELAVIL) 25 MG tablet Take 25 mg by mouth at bedtime as needed for sleep.      Ascorbic Acid (VITAMIN C) 1000 MG tablet Take 1,000 mg by mouth daily.     aspirin EC 81 MG tablet Take 1 tablet (81 mg total) by mouth daily.     BD INSULIN SYRINGE U/F 31G X 5/16" 1 ML MISC      cyanocobalamin (VITAMIN B12) 1000 MCG tablet Take 1,000 mcg by mouth daily.     Dulaglutide 3 MG/0.5ML SOPN Inject 3 mg into the skin every Saturday.     Evolocumab (REPATHA SURECLICK) 140 MG/ML SOAJ Inject 140 mg into the skin every 14 (fourteen) days. 6 mL 3   ezetimibe (ZETIA) 10 MG tablet Take 1 tablet (10 mg total) by mouth daily. 90 tablet 3   fexofenadine (ALLEGRA ALLERGY) 180 MG tablet Take 180 mg by mouth as needed for allergies or rhinitis.     furosemide (LASIX) 20 MG tablet Take 1 tablet (20 mg total) by mouth daily.  90 tablet 3   insulin NPH-regular Human (NOVOLIN 70/30) (70-30) 100 UNIT/ML injection Inject 30 Units into the skin with breakfast, with lunch, and with evening meal.     isosorbide mononitrate (IMDUR) 30 MG 24 hr tablet Take 1 tablet (30 mg total) by mouth daily. 90 tablet 3   losartan (COZAAR) 100 MG tablet Take 1 tablet (100 mg total) by mouth daily. 90 tablet 3   MAGNESIUM PO Take 200 mg by mouth daily.     metoprolol succinate (TOPROL-XL) 100 MG 24 hr tablet Take 1 tablet (100 mg total) by mouth daily. Take with or immediately following a meal.     Multiple Vitamin (MULTI-VITAMIN) tablet Take 1 tablet by mouth daily.     naproxen (NAPROSYN) 500 MG tablet Take 500 mg by mouth 2 (two) times daily.     nitrofurantoin, macrocrystal-monohydrate, (MACROBID) 100 MG capsule Take 100 mg by mouth 2 (two) times daily.     nitroGLYCERIN (NITROSTAT) 0.4 MG SL tablet Place 1 tablet (0.4 mg total) under the tongue every 5 (five) minutes as needed for chest pain. 90 tablet 3   ondansetron (ZOFRAN-ODT) 4 MG disintegrating tablet Take by mouth as needed.     ONETOUCH VERIO test strip      pantoprazole (PROTONIX) 40 MG tablet Take 40 mg by mouth daily.     potassium chloride (KLOR-CON) 10 MEQ tablet Take 1 tablet (10 mEq total) by mouth daily. 90 tablet 3   sertraline (ZOLOFT) 25 MG tablet Take 1 tablet by mouth once daily 30 tablet 0   SPIRIVA HANDIHALER 18 MCG inhalation capsule Place 18 mcg into inhaler and inhale at bedtime.     Zinc 50 MG TABS Take 50 mg by mouth daily.     No current facility-administered medications for this visit.   Facility-Administered Medications Ordered in Other Visits  Medication Dose Route Frequency Provider Last Rate Last Admin   heparin lock flush 100 unit/mL  500 Units Intravenous Once Alinda Dooms, NP       sodium chloride flush (NS) 0.9 % injection 10 mL  10 mL Intravenous Once Alinda Dooms, NP       sodium chloride flush (NS) 0.9 % injection 3 mL  3 mL Intravenous  Q12H Scoggins, Amber, NP        Allergies:   Sulfa antibiotics, Amiodarone, Invokamet [canagliflozin-metformin hcl], Lovastatin, Penicillins, and Vicodin [hydrocodone-acetaminophen]   Social History:  The patient  reports  that she quit smoking about 38 years ago. Her smoking use included cigarettes. She has never used smokeless tobacco. She reports that she does not drink alcohol and does not use drugs.   Family History:   family history includes Breast cancer (age of onset: 35) in her mother; COPD in her father; Diabetes in her father; Seizures in her father; Stroke in her father; Uterine cancer in her mother.    Review of Systems: Review of Systems  Constitutional: Negative.   HENT: Negative.    Respiratory:  Positive for shortness of breath.   Gastrointestinal: Negative.   Musculoskeletal:  Positive for joint pain.  Neurological: Negative.   Psychiatric/Behavioral: Negative.    All other systems reviewed and are negative.   PHYSICAL EXAM: VS:  BP (!) 145/80 (BP Location: Left Arm)   Pulse 66   Ht 5' (1.524 m)   Wt 210 lb 6 oz (95.4 kg)   SpO2 97%   BMI 41.09 kg/m  , BMI Body mass index is 41.09 kg/m. Constitutional:  oriented to person, place, and time. No distress.  HENT:  Head: Grossly normal Eyes:  no discharge. No scleral icterus.  Neck: No JVD, no carotid bruits  Cardiovascular: Regular rate and rhythm, no murmurs appreciated Pulmonary/Chest: Clear to auscultation bilaterally, no wheezes or rails Abdominal: Soft.  no distension.  no tenderness.  Musculoskeletal: Normal range of motion Neurological:  normal muscle tone. Coordination normal. No atrophy Skin: Skin warm and dry Psychiatric: normal affect, pleasant  Recent Labs: 03/08/2023: Magnesium 1.9 09/05/2023: Hemoglobin 12.0; Platelet Count 298 09/26/2023: ALT 21 09/27/2023: BNP 82.0; BUN 20; Creatinine, Ser 0.86; Potassium 4.9; Sodium 143    Lipid Panel Lab Results  Component Value Date   CHOL 160  09/26/2023   HDL 47 09/26/2023   LDLCALC 79 09/26/2023   TRIG 202 (H) 09/26/2023      Wt Readings from Last 3 Encounters:  11/28/23 210 lb 6 oz (95.4 kg)  11/02/23 207 lb 2.1 oz (94 kg)  09/27/23 207 lb 2 oz (94 kg)      ASSESSMENT AND PLAN:  Problem List Items Addressed This Visit       Cardiology Problems   CAD (coronary artery disease) - Primary (Chronic)   Relevant Orders   EKG 12-Lead (Completed)   HTN (hypertension) (Chronic)   Relevant Orders   EKG 12-Lead (Completed)   HLD (hyperlipidemia)   PVD (peripheral vascular disease) (HCC)   Aortic atherosclerosis (HCC)   Relevant Orders   EKG 12-Lead (Completed)   Other Visit Diagnoses     Severe aortic stenosis       Relevant Orders   EKG 12-Lead (Completed)   S/P aortic valve replacement       Relevant Orders   EKG 12-Lead (Completed)   Hx of CABG       Relevant Orders   EKG 12-Lead (Completed)   Type 2 diabetes mellitus with complication, with long-term current use of insulin (HCC)       Hyperlipidemia, mixed           Aortic valve stenosis Severe mean gradient, peak gradient, velocity noted March 2023 at [facility] Repeat echo confirming severe aortic valve stenosis Has completed SAVR and CABG Reports she is active, no regular exercise program Preop antibiotics :clindamycin 600 mg 1 hour prior to any procedure  Appears euvolemic Completed echo April 2024  Preop knee surgery Reports she will be meeting with orthopedics Considering knee surgery early 2025 She would require preop antibiotics given  prosthetic valve, clindamycin 600 mg acceptable risk, no further cardiac workup needed  Coronary disease with stable angina Severe coronary disease on review of catheterization images July 2023 Completed CABG Currently with no symptoms of angina. No further workup at this time. Continue current medication regimen.  Hyperlipidemia Continue Repatha with Zetia  Diabetes type 2 with complications A1c has  climbed, up to 8, followed by endocrinology  Anemia History of iron deficiency anemia, Most recent hemoglobin 12  Chronic fatigue After losing her husband 3 weeks ago Feels she is more depressed, recommend she talk with primary care before she increases her Zoloft Long discussion concerning grief   Signed, Dossie Arbour, M.D., Ph.D. [facility] [place], [place] [phone]

## 2023-11-28 ENCOUNTER — Ambulatory Visit: Payer: Medicare HMO | Attending: Cardiovascular Disease | Admitting: Cardiovascular Disease

## 2023-11-28 ENCOUNTER — Encounter: Payer: Self-pay | Admitting: Cardiovascular Disease

## 2023-11-28 VITALS — BP 145/80 | HR 66 | Ht 60.0 in | Wt 210.4 lb

## 2023-11-28 DIAGNOSIS — E118 Type 2 diabetes mellitus with unspecified complications: Secondary | ICD-10-CM

## 2023-11-28 DIAGNOSIS — E782 Mixed hyperlipidemia: Secondary | ICD-10-CM

## 2023-11-28 DIAGNOSIS — Z952 Presence of prosthetic heart valve: Secondary | ICD-10-CM | POA: Diagnosis not present

## 2023-11-28 DIAGNOSIS — Z951 Presence of aortocoronary bypass graft: Secondary | ICD-10-CM

## 2023-11-28 DIAGNOSIS — I350 Nonrheumatic aortic (valve) stenosis: Secondary | ICD-10-CM | POA: Diagnosis not present

## 2023-11-28 DIAGNOSIS — I10 Essential (primary) hypertension: Secondary | ICD-10-CM

## 2023-11-28 DIAGNOSIS — I25118 Atherosclerotic heart disease of native coronary artery with other forms of angina pectoris: Secondary | ICD-10-CM | POA: Diagnosis not present

## 2023-11-28 DIAGNOSIS — I700 Atherosclerosis of aorta: Secondary | ICD-10-CM

## 2023-11-28 DIAGNOSIS — I739 Peripheral vascular disease, unspecified: Secondary | ICD-10-CM

## 2023-11-28 DIAGNOSIS — Z794 Long term (current) use of insulin: Secondary | ICD-10-CM

## 2023-11-28 NOTE — Patient Instructions (Signed)

## 2023-12-22 ENCOUNTER — Other Ambulatory Visit: Payer: Self-pay | Admitting: Cardiovascular Disease

## 2023-12-26 ENCOUNTER — Encounter: Payer: Self-pay | Admitting: Oncology

## 2023-12-27 ENCOUNTER — Ambulatory Visit
Admission: RE | Admit: 2023-12-27 | Discharge: 2023-12-27 | Disposition: A | Discharge status: Home / Self Care | Payer: Medicare HMO | Source: Ambulatory Visit | Attending: Family Medicine | Admitting: Family Medicine

## 2023-12-27 ENCOUNTER — Ambulatory Visit
Admission: RE | Admit: 2023-12-27 | Discharge: 2023-12-27 | Disposition: A | Discharge status: Home / Self Care | Payer: Medicare HMO | Attending: Family Medicine | Admitting: Family Medicine

## 2023-12-27 ENCOUNTER — Other Ambulatory Visit: Payer: Self-pay | Admitting: Family Medicine

## 2023-12-27 DIAGNOSIS — B9789 Other viral agents as the cause of diseases classified elsewhere: Secondary | ICD-10-CM | POA: Diagnosis present

## 2023-12-27 DIAGNOSIS — J988 Other specified respiratory disorders: Secondary | ICD-10-CM | POA: Diagnosis present

## 2024-01-03 ENCOUNTER — Encounter: Payer: Self-pay | Admitting: Oncology

## 2024-01-04 ENCOUNTER — Encounter: Payer: Self-pay | Admitting: Oncology

## 2024-01-04 ENCOUNTER — Telehealth: Payer: Self-pay

## 2024-01-04 NOTE — Telephone Encounter (Signed)
 Calling [name] regarding her 2025 LILLY and BICARES PAP (Trulicity,Humulin,Spiriva,left a HIPAA VM.

## 2024-01-06 ENCOUNTER — Inpatient Hospital Stay: Payer: Medicare PPO | Attending: Oncology

## 2024-01-06 DIAGNOSIS — M2550 Pain in unspecified joint: Secondary | ICD-10-CM | POA: Diagnosis not present

## 2024-01-06 DIAGNOSIS — Z862 Personal history of diseases of the blood and blood-forming organs and certain disorders involving the immune mechanism: Secondary | ICD-10-CM | POA: Insufficient documentation

## 2024-01-06 DIAGNOSIS — Z87891 Personal history of nicotine dependence: Secondary | ICD-10-CM | POA: Insufficient documentation

## 2024-01-06 DIAGNOSIS — D509 Iron deficiency anemia, unspecified: Secondary | ICD-10-CM

## 2024-01-06 DIAGNOSIS — I10 Essential (primary) hypertension: Secondary | ICD-10-CM | POA: Diagnosis not present

## 2024-01-06 LAB — CBC WITH DIFFERENTIAL (CANCER CENTER ONLY)
Abs Immature Granulocytes: 0.02 10*3/uL (ref 0.00–0.07)
Basophils Absolute: 0.1 10*3/uL (ref 0.0–0.1)
Basophils Relative: 1 %
Eosinophils Absolute: 0.2 10*3/uL (ref 0.0–0.5)
Eosinophils Relative: 3 %
HCT: 37.3 % (ref 36.0–46.0)
Hemoglobin: 12.4 g/dL (ref 12.0–15.0)
Immature Granulocytes: 0 %
Lymphocytes Relative: 36 %
Lymphs Abs: 2.4 10*3/uL (ref 0.7–4.0)
MCH: 30.2 pg (ref 26.0–34.0)
MCHC: 33.2 g/dL (ref 30.0–36.0)
MCV: 90.8 fL (ref 80.0–100.0)
Monocytes Absolute: 0.6 10*3/uL (ref 0.1–1.0)
Monocytes Relative: 9 %
Neutro Abs: 3.2 10*3/uL (ref 1.7–7.7)
Neutrophils Relative %: 51 %
Platelet Count: 311 10*3/uL (ref 150–400)
RBC: 4.11 MIL/uL (ref 3.87–5.11)
RDW: 12.7 % (ref 11.5–15.5)
WBC Count: 6.5 10*3/uL (ref 4.0–10.5)
nRBC: 0 % (ref 0.0–0.2)

## 2024-01-06 LAB — FERRITIN: Ferritin: 83 ng/mL (ref 11–307)

## 2024-01-06 LAB — IRON AND TIBC
Iron: 88 ug/dL (ref 28–170)
Saturation Ratios: 29 % (ref 10.4–31.8)
TIBC: 301 ug/dL (ref 250–450)
UIBC: 213 ug/dL

## 2024-01-09 ENCOUNTER — Inpatient Hospital Stay: Payer: Medicare PPO | Admitting: Oncology

## 2024-01-09 ENCOUNTER — Inpatient Hospital Stay: Payer: Medicare PPO

## 2024-01-19 NOTE — Telephone Encounter (Signed)
Calling [name] pt is due to re-enroll on patients assistance application for [employer] pt did not answer left  HIPAA VM.

## 2024-01-25 ENCOUNTER — Encounter: Payer: Self-pay | Admitting: Oncology

## 2024-01-27 NOTE — Telephone Encounter (Signed)
Pt send mychart msg regarding pt pap on Lilly cares and [employer]ica[employer],explain she has dropped off her application at Dr Efraim Kaufmann [place],Dr office has faxed it to company and waiting on there respond.

## 2024-02-01 ENCOUNTER — Ambulatory Visit: Payer: Medicare PPO | Admitting: Podiatry

## 2024-02-21 ENCOUNTER — Ambulatory Visit (INDEPENDENT_AMBULATORY_CARE_PROVIDER_SITE_OTHER): Payer: Medicare PPO | Admitting: Vascular Surgery

## 2024-02-21 ENCOUNTER — Ambulatory Visit (INDEPENDENT_AMBULATORY_CARE_PROVIDER_SITE_OTHER): Payer: Medicare PPO

## 2024-02-21 ENCOUNTER — Encounter (INDEPENDENT_AMBULATORY_CARE_PROVIDER_SITE_OTHER): Payer: Self-pay | Admitting: Vascular Surgery

## 2024-02-21 VITALS — BP 152/70 | HR 76 | Resp 16 | Wt 208.6 lb

## 2024-02-21 DIAGNOSIS — E1165 Type 2 diabetes mellitus with hyperglycemia: Secondary | ICD-10-CM

## 2024-02-21 DIAGNOSIS — I10 Essential (primary) hypertension: Secondary | ICD-10-CM | POA: Diagnosis not present

## 2024-02-21 DIAGNOSIS — E782 Mixed hyperlipidemia: Secondary | ICD-10-CM

## 2024-02-21 DIAGNOSIS — I739 Peripheral vascular disease, unspecified: Secondary | ICD-10-CM | POA: Diagnosis not present

## 2024-02-21 DIAGNOSIS — I6523 Occlusion and stenosis of bilateral carotid arteries: Secondary | ICD-10-CM

## 2024-02-21 NOTE — Progress Notes (Signed)
 MRN : [id number]  Joyce Potter is a 78 y.o. (11/02/1946) female who presents with chief complaint of  Chief Complaint  Patient presents with   Follow-up    51yr carotid follow up  .  History of Present Illness: Patient returns today in follow up of her carotid disease.  She is doing well today.  She denies any focal neurologic symptoms of cerebrovascular ischemia. Specifically, the patient denies amaurosis fugax, speech or swallowing difficulties, or arm or leg weakness or numbness.  She is status post left carotid endarterectomy over a decade ago.  Carotid duplex today shows a widely patent left carotid endarterectomy without recurrent stenosis and 1 to 39% right ICA stenosis.  Current Outpatient Medications  Medication Sig Dispense Refill   albuterol (VENTOLIN HFA) 108 (90 Base) MCG/ACT inhaler Inhale 2 puffs into the lungs every 4 (four) hours as needed for wheezing or shortness of breath.      amitriptyline (ELAVIL) 25 MG tablet Take 25 mg by mouth at bedtime as needed for sleep.      Ascorbic Acid (VITAMIN C) 1000 MG tablet Take 1,000 mg by mouth daily.     aspirin EC 81 MG tablet Take 1 tablet (81 mg total) by mouth daily.     BD INSULIN SYRINGE U/F 31G X 5/16" 1 ML MISC      cyanocobalamin (VITAMIN B12) 1000 MCG tablet Take 1,000 mcg by mouth daily.     Dulaglutide 3 MG/0.5ML SOPN Inject 3 mg into the skin every Saturday.     Evolocumab (REPATHA SURECLICK) 140 MG/ML SOAJ Inject 140 mg into the skin every 14 (fourteen) days. 6 mL 3   insulin NPH-regular Human (NOVOLIN 70/30) (70-30) 100 UNIT/ML injection Inject 30 Units into the skin with breakfast, with lunch, and with evening meal.     losartan (COZAAR) 100 MG tablet Take 1 tablet (100 mg total) by mouth daily. 90 tablet 3   MAGNESIUM PO Take 200 mg by mouth daily.     metoprolol succinate (TOPROL-XL) 100 MG 24 hr tablet TAKE 2 TABLETS BY MOUTH IN THE MORNING AND 1 TABLET AT NIGHT 90 tablet 0   Multiple Vitamin  (MULTI-VITAMIN) tablet Take 1 tablet by mouth daily.     naproxen (NAPROSYN) 500 MG tablet Take 500 mg by mouth 2 (two) times daily.     nitrofurantoin, macrocrystal-monohydrate, (MACROBID) 100 MG capsule Take 100 mg by mouth 2 (two) times daily.     ondansetron (ZOFRAN-ODT) 4 MG disintegrating tablet Take by mouth as needed.     ONETOUCH VERIO test strip      pantoprazole (PROTONIX) 40 MG tablet Take 40 mg by mouth daily.     sertraline (ZOLOFT) 25 MG tablet Take 1 tablet by mouth once daily 30 tablet 0   SPIRIVA HANDIHALER 18 MCG inhalation capsule Place 18 mcg into inhaler and inhale at bedtime.     Zinc 50 MG TABS Take 50 mg by mouth daily.     ezetimibe (ZETIA) 10 MG tablet Take 1 tablet (10 mg total) by mouth daily. 90 tablet 3   fexofenadine (ALLEGRA ALLERGY) 180 MG tablet Take 180 mg by mouth as needed for allergies or rhinitis.     furosemide (LASIX) 20 MG tablet Take 1 tablet (20 mg total) by mouth daily. 90 tablet 3   isosorbide mononitrate (IMDUR) 30 MG 24 hr tablet Take 1 tablet (30 mg total) by mouth daily. 90 tablet 3   nitroGLYCERIN (NITROSTAT) 0.4 MG SL tablet Place  1 tablet (0.4 mg total) under the tongue every 5 (five) minutes as needed for chest pain. 90 tablet 3   potassium chloride (KLOR-CON) 10 MEQ tablet Take 1 tablet (10 mEq total) by mouth daily. 90 tablet 3   No current facility-administered medications for this visit.   Facility-Administered Medications Ordered in Other Visits  Medication Dose Route Frequency Provider Last Rate Last Admin   heparin lock flush 100 unit/mL  500 Units Intravenous Once Alinda Dooms, NP       sodium chloride flush (NS) 0.9 % injection 10 mL  10 mL Intravenous Once Alinda Dooms, NP       sodium chloride flush (NS) 0.9 % injection 3 mL  3 mL Intravenous Q12H Scoggins, Amber, NP        Past Medical History:  Diagnosis Date   ADHD (attention deficit hyperactivity disorder)    Anemia    iron deficiency   Anxiety    Aortic  stenosis    Arthritis    Asthma    Basal cell carcinoma    CAD (coronary artery disease)    s/p Left circumflex stent in 2012   Carotid stenosis    Cataract    Heart murmur    High cholesterol    HTN (hypertension)    Hyperlipidemia    IDDM (insulin dependent diabetes mellitus)    Multiple thyroid nodules    Benign   Neuromuscular disorder (HCC)    Peptic ulcer disease    Pneumonia 1952   PONV (postoperative nausea and vomiting)    Retinal artery occlusion    on left   Sleep apnea    Stroke (HCC) 2018   ischemic    Past Surgical History:  Procedure Laterality Date   ABDOMINAL HYSTERECTOMY     AORTIC VALVE REPLACEMENT N/A 03/02/2023   Procedure: AORTIC VALVE REPLACEMENT (AVR) USING EDWARDS INSPIRIS RESILIA 23 MM AORTIC VALVE;  Surgeon: Eugenio Hoes, MD;  Location: [facility];  Service: Open Heart Surgery;  Laterality: N/A;   ARTERY BIOPSY Left 11/19/2015   Procedure: BIOPSY TEMPORAL ARTERY;  Surgeon: Annice Needy, MD;  Location: [facility] ORS;  Service: Vascular;  Laterality: Left;   BREAST BIOPSY Left 2002   core- neg   BREAST EXCISIONAL BIOPSY     ??? not visible scar on neither breast- pt believes it to be right breast    CARDIAC CATHETERIZATION N/A 08/08/2015   Procedure: Right and Left Heart Cath and Coronary Angiography;  Surgeon: Dalia Heading, MD;  Location: [facility];  Service: Cardiovascular;  Laterality: N/A;   CARDIAC CATHETERIZATION Bilateral 09/03/2016   Procedure: Right/Left Heart Cath and Coronary Angiography;  Surgeon: Dalia Heading, MD;  Location: [facility];  Service: Cardiovascular;  Laterality: Bilateral;   CATARACT EXTRACTION W/ INTRAOCULAR LENS  IMPLANT, BILATERAL     COLONOSCOPY     in 2013- internal hemorrhoids   COLONOSCOPY WITH PROPOFOL N/A 07/07/2018   Procedure: COLONOSCOPY WITH PROPOFOL;  Surgeon: Scot Jun, MD;  Location[facility] ENDOSCOPY;  Service: Endoscopy;  Laterality: N/A;   COLONOSCOPY WITH PROPOFOL N/A 05/22/2020    Procedure: COLONOSCOPY WITH PROPOFOL;  Surgeon: Wyline Mood, MD;  Location: [facility] ENDOSCOPY;  Service: Gastroenterology;  Laterality: N/A;   CORONARY ANGIOGRAPHY N/A 09/14/2017   Procedure: CORONARY ANGIOGRAPHY;  Surgeon: Dalia Heading, MD;  Location: [facility];  Service: Cardiovascular;  Laterality: N/A;   CORONARY ANGIOPLASTY     CORONARY ARTERY BYPASS GRAFT N/A 03/02/2023  Procedure: CORONARY ARTERY BYPASS GRAFTING (CABG) TIMES THREE USING THE LEFT INTERNAL MAMMARY ARTERY (LIMA) AND ENDOSCOPICALLY HARVESTED RIGHT GREATER SAPHENOUS VEIN;  Surgeon: Eugenio Hoes, MD;  Location: [facility] OR;  Service: Open Heart Surgery;  Laterality: N/A;   CORONARY STENT INTERVENTION N/A 05/15/2020   Procedure: coronary angiography;  Surgeon: Dalia Heading, MD;  Location: [facility];  Service: Cardiovascular;  Laterality: N/A;   CORONARY STENT INTERVENTION N/A 05/15/2020   Procedure: CORONARY STENT INTERVENTION;  Surgeon: Marcina Millard, MD;  Location: [facility];  Service: Cardiovascular;  Laterality: N/A;   CORONARY STENT PLACEMENT     ENDARTERECTOMY Left 10/01/2015   Procedure: ENDARTERECTOMY CAROTID;  Surgeon: Annice Needy, MD;  Location: [facility] ORS;  Service: Vascular;  Laterality: Left;   ENTEROSCOPY N/A 07/24/2020   Procedure: ENTEROSCOPY;  Surgeon: Pasty Spillers, MD;  Location: [facility] ENDOSCOPY;  Service: Endoscopy;  Laterality: N/A;   ESOPHAGOGASTRODUODENOSCOPY     ESOPHAGOGASTRODUODENOSCOPY (EGD) WITH PROPOFOL N/A 05/31/2016   Procedure: ESOPHAGOGASTRODUODENOSCOPY (EGD) WITH PROPOFOL;  Surgeon: Scot Jun, MD;  Location: [facility] ENDOSCOPY;  Service: Endoscopy;  Laterality: N/A;   ESOPHAGOGASTRODUODENOSCOPY (EGD) WITH PROPOFOL N/A 07/07/2018   Procedure: ESOPHAGOGASTRODUODENOSCOPY (EGD) WITH PROPOFOL;  Surgeon: Scot Jun, MD;  Location: [facility] ENDOSCOPY;  Service: Endoscopy;  Laterality: N/A;   ESOPHAGOGASTRODUODENOSCOPY (EGD) WITH PROPOFOL N/A 07/17/2018   Procedure:  ESOPHAGOGASTRODUODENOSCOPY (EGD) WITH PROPOFOL;  Surgeon: Midge Minium, MD;  Location: [facility] ENDOSCOPY;  Service: Endoscopy;  Laterality: N/A;   ESOPHAGOGASTRODUODENOSCOPY (EGD) WITH PROPOFOL N/A 05/21/2020   Procedure: ESOPHAGOGASTRODUODENOSCOPY (EGD) WITH PROPOFOL;  Surgeon: Wyline Mood, MD;  Location: [facility] ENDOSCOPY;  Service: Gastroenterology;  Laterality: N/A;   ESOPHAGOGASTRODUODENOSCOPY (EGD) WITH PROPOFOL N/A 06/13/2020   Procedure: ESOPHAGOGASTRODUODENOSCOPY (EGD) WITH PROPOFOL;  Surgeon: Midge Minium, MD;  Location: [facility] ENDOSCOPY;  Service: Endoscopy;  Laterality: N/A;   EYE SURGERY     GIVENS CAPSULE STUDY N/A 04/17/2013   Procedure: GIVENS CAPSULE STUDY;  Surgeon: Willis Modena, MD;  Location: [facility] ENDOSCOPY;  Service: Endoscopy;  Laterality: N/A;  patient ate breakfast at 7am    GIVENS CAPSULE STUDY N/A 06/11/2020   Procedure: GIVENS CAPSULE STUDY;  Surgeon: Pasty Spillers, MD;  Location: [facility] ENDOSCOPY;  Service: Endoscopy;  Laterality: N/A;   GIVENS CAPSULE STUDY N/A 01/29/2021   Procedure: GIVENS CAPSULE STUDY;  Surgeon: Pasty Spillers, MD;  Location: [facility] ENDOSCOPY;  Service: Endoscopy;  Laterality: N/A;   GIVENS CAPSULE STUDY N/A 08/24/2021   Procedure: GIVENS CAPSULE STUDY;  Surgeon: Pasty Spillers, MD;  Location: [facility] ENDOSCOPY;  Service: Endoscopy;  Laterality: N/A;   HEMORRHOID BANDING  07/27/2016   Dr Malissa Hippo   LEFT HEART CATH AND CORONARY ANGIOGRAPHY N/A 07/08/2022   Procedure: LEFT HEART CATH AND CORONARY ANGIOGRAPHY;  Surgeon: Laurier Nancy, MD;  Location: [facility];  Service: Cardiovascular;  Laterality: N/A;   PARTIAL HYSTERECTOMY     RIGHT AND LEFT HEART CATH Bilateral 09/14/2017   Procedure: RIGHT AND LEFT HEART CATH;  Surgeon: Dalia Heading, MD;  Location: [facility];  Service: Cardiovascular;  Laterality: Bilateral;   RIGHT HEART CATH AND CORONARY ANGIOGRAPHY N/A 01/26/2023   Procedure: RIGHT HEART CATH AND CORONARY ANGIOGRAPHY;   Surgeon: Orbie Pyo, MD;  Location: [facility];  Service: Cardiovascular;  Laterality: N/A;   RIGHT/LEFT HEART CATH AND CORONARY ANGIOGRAPHY Right 05/15/2020   Procedure: Right/Left Heart cath;  Surgeon: Dalia Heading, MD;  Location: [facility];  Service:  Cardiovascular;  Laterality: Right;   TEE WITHOUT CARDIOVERSION N/A 03/02/2023   Procedure: TRANSESOPHAGEAL ECHOCARDIOGRAM;  Surgeon: Eugenio Hoes, MD;  Location: [facility] OR;  Service: Open Heart Surgery;  Laterality: N/A;   TRIGGER FINGER RELEASE       Social History   Tobacco Use   Smoking status: Former    Current packs/day: 0.00    Types: Cigarettes    Quit date: 02/15/1985    Years since quitting: 39.0   Smokeless tobacco: Never   Tobacco comments:    quit about 31 years ago  Vaping Use   Vaping status: Never Used  Substance Use Topics   Alcohol use: No    Alcohol/week: 0.0 standard drinks of alcohol   Drug use: No       Family History  Problem Relation Age of Onset   Uterine cancer Mother    Breast cancer Mother 83   Seizures Father    Stroke Father    Diabetes Father    COPD Father    Colon cancer Neg Hx      Allergies  Allergen Reactions   Sulfa Antibiotics Other (See Comments)    Rash and flushing Dizziness  Lightheaded      Amiodarone Hives, Itching and Nausea Only   Invokamet [Canagliflozin-Metformin Hcl] Other (See Comments)    Yeast Infection   Lovastatin Rash   Penicillins Rash    Has patient had a PCN reaction causing immediate rash, facial/tongue/throat swelling, SOB or lightheadedness with hypotension:Yes Has patient had a PCN reaction causing severe rash involving mucus membranes or skin necrosis:all over body Has patient had a PCN reaction that required hospitalization: No Has patient had a PCN reaction occurring within the last 10 years: No If all of the above answers are "NO", then may proceed with Cephalosporin use.    Vicodin [Hydrocodone-Acetaminophen] Rash      REVIEW OF SYSTEMS (Negative unless checked)  Constitutional: [] Weight loss  [] Fever  [] Chills Cardiac: [] Chest pain   [] Chest pressure   [] Palpitations   [] Shortness of breath when laying flat   [] Shortness of breath at rest   [] Shortness of breath with exertion. Vascular:  [] Pain in legs with walking   [] Pain in legs at rest   [] Pain in legs when laying flat   [] Claudication   [] Pain in feet when walking  [] Pain in feet at rest  [] Pain in feet when laying flat   [] History of DVT   [] Phlebitis   [] Swelling in legs   [] Varicose veins   [] Non-healing ulcers Pulmonary:   [] Uses home oxygen   [] Productive cough   [] Hemoptysis   [] Wheeze  [] COPD   [] Asthma Neurologic:  [] Dizziness  [] Blackouts   [] Seizures   [] History of stroke   [] History of TIA  [] Aphasia   [] Temporary blindness   [] Dysphagia   [] Weakness or numbness in arms   [x] Weakness or numbness in legs Musculoskeletal:  [x] Arthritis   [] Joint swelling   [x] Joint pain   [] Low back pain Hematologic:  [] Easy bruising  [] Easy bleeding   [] Hypercoagulable state   [] Anemic   Gastrointestinal:  [] Blood in stool   [] Vomiting blood  [] Gastroesophageal reflux/heartburn   [] Abdominal pain Genitourinary:  [] Chronic kidney disease   [] Difficult urination  [] Frequent urination  [] Burning with urination   [] Hematuria Skin:  [] Rashes   [] Ulcers   [] Wounds Psychological:  [] History of anxiety   []  History of major depression.  Physical Examination  BP (!) 152/70   Pulse 76   Resp 16  Wt 208 lb 9.6 oz (94.6 kg)   BMI 40.74 kg/m  Gen:  WD/WN, NAD.  Appears younger than stated age Head: /AT, No temporalis wasting. Ear/Nose/Throat: Hearing grossly intact, nares w/o erythema or drainage Eyes: Conjunctiva clear. Sclera non-icteric Neck: Supple.  Trachea midline.  No bruit Pulmonary:  Good air movement, no use of accessory muscles.  Cardiac: RRR, no JVD Vascular:  Vessel Right Left  Radial Palpable Palpable           Musculoskeletal: M/S 5/5  throughout.  No deformity or atrophy.  Neurologic: Sensation grossly intact in extremities.  Symmetrical.  Speech is fluent.  Psychiatric: Judgment intact, Mood & affect appropriate for pt's clinical situation. Dermatologic: No rashes or ulcers noted.  No cellulitis or open wounds.      Labs Recent Results (from the past 2160 hours)  Ferritin     Status: None   Collection Time: 01/06/24  8:43 AM  Result Value Ref Range   Ferritin 83 11 - 307 ng/mL    Comment: Performed at [facility], [address] [place], [place] [address]  Iron and TIBC([facility])     Status: None   Collection Time: 01/06/24  8:43 AM  Result Value Ref Range   Iron 88 28 - 170 ug/dL   TIBC 604 540 - 981 ug/dL   Saturation Ratios 29 10.4 - 31.8 %   UIBC 213 ug/dL    Comment: Performed at [facility], [address] [place], [place] [address]  CBC with Differential (Cancer Center Only)     Status: None   Collection Time: 01/06/24  8:43 AM  Result Value Ref Range   WBC Count 6.5 4.0 - 10.5 K/uL   RBC 4.11 3.87 - 5.11 MIL/uL   Hemoglobin 12.4 12.0 - 15.0 g/dL   HCT 82.9 56.2 - 13.0 %   MCV 90.8 80.0 - 100.0 fL   MCH 30.2 26.0 - 34.0 pg   MCHC 33.2 30.0 - 36.0 g/dL   RDW 86.5 78.4 - 69.6 %   Platelet Count 311 150 - 400 K/uL   nRBC 0.0 0.0 - 0.2 %   Neutrophils Relative % 51 %   Neutro Abs 3.2 1.7 - 7.7 K/uL   Lymphocytes Relative 36 %   Lymphs Abs 2.4 0.7 - 4.0 K/uL   Monocytes Relative 9 %   Monocytes Absolute 0.6 0.1 - 1.0 K/uL   Eosinophils Relative 3 %   Eosinophils Absolute 0.2 0.0 - 0.5 K/uL   Basophils Relative 1 %   Basophils Absolute 0.1 0.0 - 0.1 K/uL   Immature Granulocytes 0 %   Abs Immature Granulocytes 0.02 0.00 - 0.07 K/uL    Comment: Performed at [facility], [address] [place], [place] [address]    Radiology No results found.  Assessment/Plan  Carotid stenosis Carotid duplex today shows a widely patent left carotid endarterectomy  without recurrent stenosis and 1 to 39% right ICA stenosis.  Doing well.  No role for intervention.  Continue current medications which include aspirin and Repatha.  Recheck in 1 year.  Uncontrolled type 2 diabetes mellitus with hyperglycemia (HCC) blood glucose control important in reducing the progression of atherosclerotic disease. Also, involved in wound healing. On appropriate medications.     HTN (hypertension) blood pressure control important in reducing the progression of atherosclerotic disease. On appropriate oral medications.  HLD (hyperlipidemia) lipid control important in reducing the progression of atherosclerotic disease. Continue Repatha therapy   Festus Barren, MD  02/21/2024 3:51 PM    This note was created with Dragon medical transcription system.  Any errors from dictation are purely unintentional

## 2024-02-21 NOTE — Assessment & Plan Note (Signed)
 Carotid duplex today shows a widely patent left carotid endarterectomy without recurrent stenosis and 1 to 39% right ICA stenosis.  Doing well.  No role for intervention.  Continue current medications which include aspirin and Repatha.  Recheck in 1 year.

## 2024-03-23 ENCOUNTER — Telehealth: Payer: Self-pay

## 2024-03-23 ENCOUNTER — Encounter: Payer: Self-pay | Admitting: Oncology

## 2024-03-23 ENCOUNTER — Other Ambulatory Visit (HOSPITAL_COMMUNITY): Payer: Self-pay

## 2024-03-23 NOTE — Telephone Encounter (Signed)
 Pharmacy Patient Advocate Encounter  Received notification from [facility] that Prior Authorization for REPATHA has been APPROVED from 12/28/23 to 12/26/24. Ran test claim, Copay is $40. This test claim was processed through [facility]- copay amounts may vary at other pharmacies due to pharmacy/plan contracts, or as the patient moves through the different stages of their insurance plan.

## 2024-03-23 NOTE — Telephone Encounter (Signed)
 Pharmacy Patient Advocate Encounter   Received notification from CoverMyMeds that prior authorization for REPATHA is required/requested.   Insurance verification completed.   The patient is insured through [place] .   Per test claim: PA required; PA submitted to above mentioned insurance via CoverMyMeds Key/confirmation #/EOC [id number] Status is pending

## 2024-05-15 ENCOUNTER — Encounter (INDEPENDENT_AMBULATORY_CARE_PROVIDER_SITE_OTHER): Payer: Self-pay

## 2024-06-18 ENCOUNTER — Telehealth: Payer: Self-pay | Admitting: Cardiovascular Disease

## 2024-06-18 NOTE — Telephone Encounter (Signed)
 Called and spoke with patient and she reviewed her symptoms. She has been feeling out of breath, heart racing, and just feels like she does not have enough breath. She reports that she has the sensation as if someone is gripping her heart. She also reports at night she wakes up coughing and with nose bleeds. Instructed her to proceed to ED for further work up and evaluation of her chest pain and shortness of breath. She was agreeable with plan and had no further questions at this time.

## 2024-06-18 NOTE — Telephone Encounter (Signed)
 Pt c/o of Chest Pain: STAT if active (IN THIS MOMENT) CP, including tightness, pressure, jaw pain, shoulder/upper arm/back pain, SOB, nausea, and vomiting.  1. Are you having CP right now (tightness, pressure, or discomfort)? No  2. Are you experiencing any other symptoms (ex. SOB, nausea, vomiting, sweating)?  I have all the symptoms because Im diabetic, when I move around I get tired   3. How long have you been experiencing CP? Been a couple days ago  4. Is your CP continuous or coming and going? Coming and going  5. Have you taken Nitroglycerin ?  6. If CP returns before callback, please consider calling 911. ?   Patient stated it feels like someone is grabbing her heart then letting go. Patient stated she is very scared. Please advise.

## 2024-06-21 ENCOUNTER — Other Ambulatory Visit: Payer: Self-pay | Admitting: Medical

## 2024-06-23 ENCOUNTER — Other Ambulatory Visit: Payer: Self-pay | Admitting: Cardiovascular Disease

## 2024-08-30 ENCOUNTER — Other Ambulatory Visit: Payer: Self-pay

## 2024-08-30 ENCOUNTER — Emergency Department
Admission: EM | Admit: 2024-08-30 | Discharge: 2024-08-30 | Discharge status: Against Medical Advice | Attending: Emergency Medicine | Admitting: Emergency Medicine

## 2024-08-30 ENCOUNTER — Emergency Department

## 2024-08-30 DIAGNOSIS — Z5321 Procedure and treatment not carried out due to patient leaving prior to being seen by health care provider: Secondary | ICD-10-CM | POA: Insufficient documentation

## 2024-08-30 DIAGNOSIS — R079 Chest pain, unspecified: Secondary | ICD-10-CM | POA: Insufficient documentation

## 2024-08-30 DIAGNOSIS — R0602 Shortness of breath: Secondary | ICD-10-CM | POA: Insufficient documentation

## 2024-08-30 LAB — BASIC METABOLIC PANEL WITH GFR
Anion gap: 8 (ref 5–15)
BUN: 25 mg/dL — ABNORMAL HIGH (ref 8–23)
CO2: 27 mmol/L (ref 22–32)
Calcium: 9.2 mg/dL (ref 8.9–10.3)
Chloride: 103 mmol/L (ref 98–111)
Creatinine, Ser: 0.78 mg/dL (ref 0.44–1.00)
GFR, Estimated: >60 mL/min (ref >60)
Glucose, Bld: 177 mg/dL — ABNORMAL HIGH (ref 70–99)
Potassium: 4.2 mmol/L (ref 3.5–5.1)
Sodium: 138 mmol/L (ref 135–145)

## 2024-08-30 LAB — CBC
HCT: 36.8 % (ref 36.0–46.0)
Hemoglobin: 11.8 g/dL — ABNORMAL LOW (ref 12.0–15.0)
MCH: 28.9 pg (ref 26.0–34.0)
MCHC: 32.1 g/dL (ref 30.0–36.0)
MCV: 90 fL (ref 80.0–100.0)
Platelets: 230 K/uL (ref 150–400)
RBC: 4.09 MIL/uL (ref 3.87–5.11)
RDW: 13.2 % (ref 11.5–15.5)
WBC: 5.5 K/uL (ref 4.0–10.5)
nRBC: 0 % (ref 0.0–0.2)

## 2024-08-30 LAB — TROPONIN I (HIGH SENSITIVITY)
Troponin I (High Sensitivity): 9 ng/L (ref <18)
Troponin I (High Sensitivity): 9 ng/L (ref <18)

## 2024-08-30 NOTE — ED Notes (Signed)
 Pt's daughter to STAT desk to inquire over wait time and test results; informed that the ED provider would release results during the examination and assured that any abnormalities would be addressed immediately if needed; daughter st that the pt's doctor's office visit today resulted in transport here via EMS and was assured by that doctor that her mother would not have to brought to the lobby nor wait and be seen; explained to daughter purpose of triage/protocols and acuity assignments and that EMS transport does not necessarily mean immediate placement into an exam room

## 2024-08-30 NOTE — ED Notes (Addendum)
 Daughter to desk upset over wait time and that people are going back before her; pt sitting in w/c with no distress noted; explained to daughter triage and acuity process and that the pts she was referring to were going back for testing and to be seen in an area that her mother was not; daughter begans yelling out loudly to everyone in the lobby you all might want to go somewhere else because you are going to die out here waiting!; at pt request, 18GA SL removed from rt FA--cannula intact, dsng applied and pt left

## 2024-08-30 NOTE — ED Triage Notes (Addendum)
 Pt comes via EMS with c/o chest pain. Pt has EKG with peak T waves at the doctors office. Pt states chest pain with exertion. Pt states sob also.   Pt denies any pain currently.  Pt has extensive heart history

## 2024-09-02 NOTE — Progress Notes (Unsigned)
 Cardiology Office Note  Date:  09/03/2024   ID:  [name], [name] 09/24/1946, MRN [id number]  PCP:  Odell Chard, Edra GRADE, MD   Chief Complaint  Patient presents with   Hospitalization Follow-up    Patient was at [facility] for chest pain.  Patient c/o pain in back with occasional fluttering in chest with palpitations.     HPI:  [name]  is a 78 year old woman with past medical history of aortic valve stenosis Anemia on iron  infusions, GI losses Former smoker Hypertension Coronary artery disease status post CABG, AVR March 2024 Who presents for follow-up of her severe aortic valve stenosis, chest pain, coronary disease  Last seen in clinic 12/ 2024  Palpitations, chest pain went to the ER Went to [facility], tnt neg x 2 Left the ER after waiting 5 hours Transferred to ER at [facility] Seen in the ER August 31, 2024 [facility] chest pain CT chest with no acute findings BP elevated, no medication changes made Was treated for E. coli UTI, placed on Keflex   Still having palpitations Blood pressure still running high Denies chest pain, she is having back pain  Reports having pain in the left side of her eye, hurts when turning her head, sometimes with nosebleeds  Lab work reviewed A1c 6.6 Total cholesterol 160 LDL 79 E. coli UTI  EKG personally reviewed by myself on todays visit EKG Interpretation Date/Time:  Monday September 03 2024 13:37:18 EDT Ventricular Rate:  59 PR Interval:  172 QRS Duration:  86 QT Interval:  446 QTC Calculation: 441 R Axis:   29  Text Interpretation: Sinus bradycardia Septal infarct (cited on or before 27-Sep-2023) When compared with ECG of 30-Aug-2024 17:32, QRS axis Shifted left Confirmed by Perla Lye [phone]) on 09/03/2024 1:40:28 PM   Other past medical history reviewed echo in January 2024 showing EF of 60 to 65%  with severe stenosis with a mean gradient of 40.8 mmHg and a valve area of 0.79 cm.     Right and left heart cath  in January 2024 showed proximal LAD 55% stenosis, sequential 95 to 90% stenosis in the D1, patent proximal RCA stent with minimal in-stent restenosis of 20% stenosis.  saw CVTS 02/21/2023 and it was felt was best served surgical revascularization and SAVR.   operating room on 03/02/2023 and underwent Coronary artery bypass x 2 with the LIMA to the LAD and RSVG from the aorta to the Diagonal coronary artery   She underwent aortic valve replacement utilizing 23 mm Inspiris Resilia valve.   Hospitalization was complicated with AKI, right-sided pleural effusion, hypotension requiring midodrine , post-op afib briefly on amiodarone , volume overload requiring diuresis, and anemia requiring 2 unit PRBCs. She went to a nursing home and was discharged home 2 weeks later.     EKG personally reviewed by myself on todays visit       Cardiac catheterization July 2023, images pulled up and reviewed with her today   Prox RCA lesion is 30% stenosed.   1st Diag-1 lesion is 75% stenosed.   1st Diag-2 lesion is 80% stenosed.   Prox LAD lesion is 55% stenosed.   The left ventricular systolic function is normal.   The left ventricular ejection fraction is 55-65% by visual estimate.  Echocardiogram March 2023 NORMAL LEFT VENTRICULAR SYSTOLIC FUNCTION   WITH MILD LVH  NORMAL RIGHT VENTRICULAR SYSTOLIC FUNCTION  MILD VALVULAR REGURGITATION (See above)  MODERATE VALVULAR STENOSIS (See above)  ESTIMATED LVEF >55%  Aortic: MILD AI; MODERATE  AS  AOV: SEVERELY THICKENED, PARTIALLY MOBILE LEAFLETS; AS MAX VELOCITY 4.65m/s; AVA 1.0cm^2  Mitral: TRIVIAL MR  Tricuspid: TRACE TR  Pulmonic: MILD PI  MILDLY DILATED ASCENDING AORTA MEASURING 3.3cm  Closest EF: >55% (Estimated)  LVH: MILD LVH  AVS: MODERATE AS  Aortic: MILD AR: 398.0 cm/sec peak vel       63.4 mmHg peak grad  37.2 mmHg mean grad      PMH:   has a past medical history of ADHD (attention deficit hyperactivity disorder), Anemia, Anxiety, Aortic  stenosis, Arthritis, Asthma, Basal cell carcinoma, CAD (coronary artery disease), Carotid stenosis, Cataract, Heart murmur, High cholesterol, HTN (hypertension), Hyperlipidemia, IDDM (insulin  dependent diabetes mellitus), Multiple thyroid  nodules, Neuromuscular disorder (HCC), Peptic ulcer disease, Pneumonia (1952), PONV (postoperative nausea and vomiting), Retinal artery occlusion, Sleep apnea, and Stroke (HCC) (2018).  PSH:    Past Surgical History:  Procedure Laterality Date   ABDOMINAL HYSTERECTOMY     AORTIC VALVE REPLACEMENT N/A 03/02/2023   Procedure: AORTIC VALVE REPLACEMENT (AVR) USING EDWARDS INSPIRIS RESILIA 23 MM AORTIC VALVE;  Surgeon: Maryjane Mt, MD;  [facility]: [facility] OR;  Service: Open Heart Surgery;  Laterality: N/A;   ARTERY BIOPSY Left 11/19/2015   Procedure: BIOPSY TEMPORAL ARTERY;  Surgeon: Selinda GORMAN Gu, MD;  Location: [facility] ORS;  Service: Vascular;  Laterality: Left;   BREAST BIOPSY Left 2002   core- neg   BREAST EXCISIONAL BIOPSY     ??? not visible scar on neither breast- pt believes it to be right breast    CARDIAC CATHETERIZATION N/A 08/08/2015   Procedure: Right and Left Heart Cath and Coronary Angiography;  Surgeon: Vinie DELENA Jude, MD;  Location: [facility] INVASIVE CV LAB;  Service: Cardiovascular;  Laterality: N/A;   CARDIAC CATHETERIZATION Bilateral 09/03/2016   Procedure: Right/Left Heart Cath and Coronary Angiography;  Surgeon: Vinie DELENA Jude, MD;  Location: [facility] INVASIVE CV LAB;  Service: Cardiovascular;  Laterality: Bilateral;   CATARACT EXTRACTION W/ INTRAOCULAR LENS  IMPLANT, BILATERAL     COLONOSCOPY     in 2013- internal hemorrhoids   COLONOSCOPY WITH PROPOFOL  N/A 07/07/2018   Procedure: COLONOSCOPY WITH PROPOFOL ;  Surgeon: Viktoria Lamar DASEN, MD;  Location: [facility] ENDOSCOPY;  Service: Endoscopy;  Laterality: N/A;   COLONOSCOPY WITH PROPOFOL  N/A 05/22/2020   Procedure: COLONOSCOPY WITH PROPOFOL ;  Surgeon: Therisa Bi, MD;  Location: [facility] ENDOSCOPY;  Service:  Gastroenterology;  Laterality: N/A;   CORONARY ANGIOGRAPHY N/A 09/14/2017   Procedure: CORONARY ANGIOGRAPHY;  Surgeon: Jude Vinie DELENA, MD;  Location: [facility] INVASIVE CV LAB;  Service: Cardiovascular;  Laterality: N/A;   CORONARY ANGIOPLASTY     CORONARY ARTERY BYPASS GRAFT N/A 03/02/2023   Procedure: CORONARY ARTERY BYPASS GRAFTING (CABG) TIMES THREE USING THE LEFT INTERNAL MAMMARY ARTERY (LIMA) AND ENDOSCOPICALLY HARVESTED RIGHT GREATER SAPHENOUS VEIN;  Surgeon: Maryjane Mt, MD;  Location: [facility] OR;  Service: Open Heart Surgery;  Laterality: N/A;   CORONARY STENT INTERVENTION N/A 05/15/2020   Procedure: coronary angiography;  Surgeon: Jude Vinie DELENA, MD;  Location: [facility] INVASIVE CV LAB;  Service: Cardiovascular;  Laterality: N/A;   CORONARY STENT INTERVENTION N/A 05/15/2020   Procedure: CORONARY STENT INTERVENTION;  Surgeon: Ammon Blunt, MD;  Location: [facility] INVASIVE CV LAB;  Service: Cardiovascular;  Laterality: N/A;   CORONARY STENT PLACEMENT     ENDARTERECTOMY Left 10/01/2015   Procedure: ENDARTERECTOMY CAROTID;  Surgeon: Selinda GORMAN Gu, MD;  Location: [facility] ORS;  Service: Vascular;  Laterality: Left;   ENTEROSCOPY N/A 07/24/2020   Procedure: ENTEROSCOPY;  Surgeon: Tahiliani, Varnita  B, MD;  Location: [facility] ENDOSCOPY;  Service: Endoscopy;  Laterality: N/A;   ESOPHAGOGASTRODUODENOSCOPY     ESOPHAGOGASTRODUODENOSCOPY (EGD) WITH PROPOFOL  N/A 05/31/2016   Procedure: ESOPHAGOGASTRODUODENOSCOPY (EGD) WITH PROPOFOL ;  Surgeon: Lamar ONEIDA Holmes, MD;  Location: [facility] ENDOSCOPY;  Service: Endoscopy;  Laterality: N/A;   ESOPHAGOGASTRODUODENOSCOPY (EGD) WITH PROPOFOL  N/A 07/07/2018   Procedure: ESOPHAGOGASTRODUODENOSCOPY (EGD) WITH PROPOFOL ;  Surgeon: Holmes Lamar ONEIDA, MD;  Location: [facility] ENDOSCOPY;  Service: Endoscopy;  Laterality: N/A;   ESOPHAGOGASTRODUODENOSCOPY (EGD) WITH PROPOFOL  N/A 07/17/2018   Procedure: ESOPHAGOGASTRODUODENOSCOPY (EGD) WITH PROPOFOL ;  Surgeon: Jinny Carmine, MD;  Location: [facility]  ENDOSCOPY;  Service: Endoscopy;  Laterality: N/A;   ESOPHAGOGASTRODUODENOSCOPY (EGD) WITH PROPOFOL  N/A 05/21/2020   Procedure: ESOPHAGOGASTRODUODENOSCOPY (EGD) WITH PROPOFOL ;  Surgeon: Therisa Bi, MD;  Location: [facility] ENDOSCOPY;  Service: Gastroenterology;  Laterality: N/A;   ESOPHAGOGASTRODUODENOSCOPY (EGD) WITH PROPOFOL  N/A 06/13/2020   Procedure: ESOPHAGOGASTRODUODENOSCOPY (EGD) WITH PROPOFOL ;  Surgeon: Jinny Carmine, MD;  Location: [facility] ENDOSCOPY;  Service: Endoscopy;  Laterality: N/A;   EYE SURGERY     GIVENS CAPSULE STUDY N/A 04/17/2013   Procedure: GIVENS CAPSULE STUDY;  Surgeon: Elsie Cree, MD;  Location: [facility] ENDOSCOPY;  Service: Endoscopy;  Laterality: N/A;  patient ate breakfast at 7am    GIVENS CAPSULE STUDY N/A 06/11/2020   Procedure: GIVENS CAPSULE STUDY;  Surgeon: Janalyn Keene NOVAK, MD;  Location: [facility] ENDOSCOPY;  Service: Endoscopy;  Laterality: N/A;   GIVENS CAPSULE STUDY N/A 01/29/2021   Procedure: GIVENS CAPSULE STUDY;  Surgeon: Janalyn Keene NOVAK, MD;  Location: [facility] ENDOSCOPY;  Service: Endoscopy;  Laterality: N/A;   GIVENS CAPSULE STUDY N/A 08/24/2021   Procedure: GIVENS CAPSULE STUDY;  Surgeon: Janalyn Keene NOVAK, MD;  Location: [facility] ENDOSCOPY;  Service: Endoscopy;  Laterality: N/A;   HEMORRHOID BANDING  07/27/2016   Dr MICAEL Sharps   LEFT HEART CATH AND CORONARY ANGIOGRAPHY N/A 07/08/2022   Procedure: LEFT HEART CATH AND CORONARY ANGIOGRAPHY;  Surgeon: Fernand Denyse LABOR, MD;  Location: [facility] INVASIVE CV LAB;  Service: Cardiovascular;  Laterality: N/A;   PARTIAL HYSTERECTOMY     RIGHT AND LEFT HEART CATH Bilateral 09/14/2017   Procedure: RIGHT AND LEFT HEART CATH;  Surgeon: Bosie Vinie LABOR, MD;  Location: [facility] INVASIVE CV LAB;  Service: Cardiovascular;  Laterality: Bilateral;   RIGHT HEART CATH AND CORONARY ANGIOGRAPHY N/A 01/26/2023   Procedure: RIGHT HEART CATH AND CORONARY ANGIOGRAPHY;  Surgeon: Wendel Lurena POUR, MD;  Location: [facility] INVASIVE CV LAB;  Service: Cardiovascular;   Laterality: N/A;   RIGHT/LEFT HEART CATH AND CORONARY ANGIOGRAPHY Right 05/15/2020   Procedure: Right/Left Heart cath;  Surgeon: Bosie Vinie LABOR, MD;  Location: [facility] INVASIVE CV LAB;  Service: Cardiovascular;  Laterality: Right;   TEE WITHOUT CARDIOVERSION N/A 03/02/2023   Procedure: TRANSESOPHAGEAL ECHOCARDIOGRAM;  Surgeon: Maryjane Mt, MD;  Location: [facility] OR;  Service: Open Heart Surgery;  Laterality: N/A;   TRIGGER FINGER RELEASE      Current Outpatient Medications  Medication Sig Dispense Refill   albuterol  (VENTOLIN  HFA) 108 (90 Base) MCG/ACT inhaler Inhale 2 puffs into the lungs every 4 (four) hours as needed for wheezing or shortness of breath.      Ascorbic Acid  (VITAMIN C ) 1000 MG tablet Take 1,000 mg by mouth daily.     aspirin  EC 81 MG tablet Take 1 tablet (81 mg total) by mouth daily.     BD INSULIN  SYRINGE U/F 31G X 5/16 1 ML MISC      cephALEXin  (KEFLEX ) 500 MG capsule Take 500 mg by mouth 4 (  four) times daily.     cyanocobalamin  (VITAMIN B12) 1000 MCG tablet Take 1,000 mcg by mouth daily.     furosemide  (LASIX ) 20 MG tablet Take 1 tablet (20 mg total) by mouth daily. 90 tablet 3   insulin  NPH-regular Human (NOVOLIN 70/30) (70-30) 100 UNIT/ML injection Inject 30 Units into the skin with breakfast, with lunch, and with evening meal.     ipratropium-albuterol  (DUONEB) 0.5-2.5 (3) MG/3ML SOLN Inhale 3 mLs into the lungs every 2 (two) hours as needed.     isosorbide  mononitrate (IMDUR ) 30 MG 24 hr tablet Take 1 tablet by mouth once daily 90 tablet 2   losartan  (COZAAR ) 100 MG tablet Take 1 tablet (100 mg total) by mouth daily. 90 tablet 3   MAGNESIUM  PO Take 200 mg by mouth daily.     metoprolol  succinate (TOPROL -XL) 100 MG 24 hr tablet TAKE 2 TABLETS BY MOUTH IN THE MORNING AND 1 AT NIGHT 90 tablet 0   Multiple Vitamin (MULTI-VITAMIN) tablet Take 1 tablet by mouth daily.     nitrofurantoin, macrocrystal-monohydrate, (MACROBID) 100 MG capsule Take 100 mg by mouth 2 (two) times daily.      nitroGLYCERIN  (NITROSTAT ) 0.4 MG SL tablet Place 1 tablet (0.4 mg total) under the tongue every 5 (five) minutes as needed for chest pain. 90 tablet 3   ondansetron  (ZOFRAN -ODT) 4 MG disintegrating tablet Take by mouth as needed.     ONETOUCH VERIO test strip      pantoprazole  (PROTONIX ) 40 MG tablet Take 40 mg by mouth daily.     potassium chloride  (KLOR-CON ) 10 MEQ tablet Take 1 tablet (10 mEq total) by mouth daily. 90 tablet 3   sertraline  (ZOLOFT ) 25 MG tablet Take 1 tablet by mouth once daily 30 tablet 0   Zinc 50 MG TABS Take 50 mg by mouth daily.     amitriptyline  (ELAVIL ) 25 MG tablet Take 25 mg by mouth at bedtime as needed for sleep.  (Patient not taking: Reported on 09/03/2024)     No current facility-administered medications for this visit.   Facility-Administered Medications Ordered in Other Visits  Medication Dose Route Frequency Provider Last Rate Last Admin   heparin  lock flush 100 unit/mL  500 Units Intravenous Once Dasie Tinnie MATSU, NP       sodium chloride  flush (NS) 0.9 % injection 10 mL  10 mL Intravenous Once Dasie Tinnie MATSU, NP       sodium chloride  flush (NS) 0.9 % injection 3 mL  3 mL Intravenous Q12H Scoggins, Amber, NP        Allergies:   Sulfa antibiotics, Amiodarone , Canagliflozin-metformin hcl, Lovastatin, Penicillins, and Vicodin [hydrocodone-acetaminophen ]   Social History:  The patient  reports that she quit smoking about 39 years ago. Her smoking use included cigarettes. She has never used smokeless tobacco. She reports that she does not drink alcohol and does not use drugs.   Family History:   family history includes Breast cancer (age of onset: 71) in her mother; COPD in her father; Diabetes in her father; Seizures in her father; Stroke in her father; Uterine cancer in her mother.    Review of Systems: Review of Systems  Constitutional: Negative.   HENT: Negative.    Respiratory:  Positive for shortness of breath.   Gastrointestinal: Negative.    Musculoskeletal:  Positive for joint pain.  Neurological: Negative.   Psychiatric/Behavioral: Negative.    All other systems reviewed and are negative.   PHYSICAL EXAM: VS:  BP (!) 180/80 (  BP Location: Left Arm, Patient Position: Sitting, Cuff Size: Normal)   Pulse (!) 59   Ht 5' 3 (1.6 m)   Wt 205 lb (93 kg)   SpO2 94%   BMI 36.31 kg/m  , BMI Body mass index is 36.31 kg/m. Constitutional:  oriented to person, place, and time. No distress.  HENT:  Head: Grossly normal Eyes:  no discharge. No scleral icterus.  Neck: No JVD, no carotid bruits  Cardiovascular: Regular rate and rhythm, no murmurs appreciated Pulmonary/Chest: Clear to auscultation bilaterally, no wheezes or rales Abdominal: Soft.  no distension.  no tenderness.  Musculoskeletal: Normal range of motion Neurological:  normal muscle tone. Coordination normal. No atrophy Skin: Skin warm and dry Psychiatric: normal affect, pleasant   Recent Labs: 09/26/2023: ALT 21 09/27/2023: BNP 82.0 08/30/2024: BUN 25; Creatinine, Ser 0.78; Hemoglobin 11.8; Platelets 230; Potassium 4.2; Sodium 138    Lipid Panel Lab Results  Component Value Date   CHOL 160 09/26/2023   HDL 47 09/26/2023   LDLCALC 79 09/26/2023   TRIG 202 (H) 09/26/2023      Wt Readings from Last 3 Encounters:  09/03/24 205 lb (93 kg)  08/30/24 202 lb (91.6 kg)  02/21/24 208 lb 9.6 oz (94.6 kg)      ASSESSMENT AND PLAN:  Problem List Items Addressed This Visit       Cardiology Problems   CAD (coronary artery disease) - Primary (Chronic)   Relevant Orders   EKG 12-Lead (Completed)   HTN (hypertension) (Chronic)   Relevant Orders   EKG 12-Lead (Completed)   HLD (hyperlipidemia)   PVD (peripheral vascular disease) (HCC)   Aortic atherosclerosis (HCC)   Relevant Orders   EKG 12-Lead (Completed)   Other Visit Diagnoses       Hx of CABG       Relevant Orders   EKG 12-Lead (Completed)     Severe aortic stenosis       Relevant Orders   EKG  12-Lead (Completed)     S/P aortic valve replacement       Relevant Orders   EKG 12-Lead (Completed)     Type 2 diabetes mellitus with complication, with long-term current use of insulin  (HCC)         Hyperlipidemia, mixed          Aortic valve stenosis completed SAVR and CABG Repeat echocardiogram pending  Coronary disease with stable angina Severe coronary disease on review of catheterization images July 2023 Completed CABG Recent episode of chest pain, troponin negative x 2 Now with back pain, left eye pain, anxiety Currently with no symptoms of angina. No further workup at this time. Continue current medication regimen.  Essential hypertension Blood pressure running high, possibly exacerbated by UTI, anxiety Commend she increase isosorbide  up to 30 mg twice daily continue losartan  and metoprolol  100  Hyperlipidemia Continue Repatha  with Zetia   Diabetes type 2 with complications followed by endocrinology  Anemia History of iron  deficiency anemia, Hemoglobin stable, followed by hematology  Chronic fatigue Recommend regular walking program   Signed, Velinda Lunger, M.D., Ph.D. [facility] [place], [place] [phone]

## 2024-09-03 ENCOUNTER — Encounter: Payer: Self-pay | Admitting: Cardiovascular Disease

## 2024-09-03 ENCOUNTER — Ambulatory Visit: Attending: Cardiovascular Disease | Admitting: Cardiovascular Disease

## 2024-09-03 VITALS — BP 180/80 | HR 59 | Ht 63.0 in | Wt 205.0 lb

## 2024-09-03 DIAGNOSIS — I739 Peripheral vascular disease, unspecified: Secondary | ICD-10-CM

## 2024-09-03 DIAGNOSIS — E118 Type 2 diabetes mellitus with unspecified complications: Secondary | ICD-10-CM

## 2024-09-03 DIAGNOSIS — E782 Mixed hyperlipidemia: Secondary | ICD-10-CM

## 2024-09-03 DIAGNOSIS — Z951 Presence of aortocoronary bypass graft: Secondary | ICD-10-CM | POA: Diagnosis not present

## 2024-09-03 DIAGNOSIS — I10 Essential (primary) hypertension: Secondary | ICD-10-CM

## 2024-09-03 DIAGNOSIS — I350 Nonrheumatic aortic (valve) stenosis: Secondary | ICD-10-CM

## 2024-09-03 DIAGNOSIS — I25118 Atherosclerotic heart disease of native coronary artery with other forms of angina pectoris: Secondary | ICD-10-CM

## 2024-09-03 DIAGNOSIS — Z952 Presence of prosthetic heart valve: Secondary | ICD-10-CM

## 2024-09-03 DIAGNOSIS — Z794 Long term (current) use of insulin: Secondary | ICD-10-CM

## 2024-09-03 DIAGNOSIS — I700 Atherosclerosis of aorta: Secondary | ICD-10-CM

## 2024-09-03 MED ORDER — METOPROLOL SUCCINATE ER 100 MG PO TB24: 100.0000 mg | ORAL_TABLET | Freq: Every day | ORAL | 3 refills | Status: AC

## 2024-09-03 MED ORDER — ISOSORBIDE MONONITRATE ER 30 MG PO TB24: 30.0000 mg | ORAL_TABLET | Freq: Two times a day (BID) | ORAL | 3 refills | Status: DC

## 2024-09-03 NOTE — Patient Instructions (Addendum)
 Please call with blood pressure numbers  Medication Instructions:   Please increase the imdur  up to 30 mg twice a day  If you need a refill on your cardiac medications before your next appointment, please call your pharmacy.   Lab work: No new labs needed  Testing/Procedures:  Your physician has requested that you have an echocardiogram. Echocardiography is a painless test that uses sound waves to create images of your heart. It provides your doctor with information about the size and shape of your heart and how well your heart's chambers and valves are working.   You may receive an ultrasound enhancing agent through an IV if needed to better visualize your heart during the echo. This procedure takes approximately one hour.  There are no restrictions for this procedure.  This will take place at [address] [facility] [address] [facility], [place] [address]  Please note: We ask at that you not bring children with you during ultrasound (echo/ vascular) testing. Due to room size and safety concerns, children are not allowed in the ultrasound rooms during exams. Our front office staff cannot provide observation of children in our lobby area while testing is being conducted. An adult accompanying a patient to their appointment will only be allowed in the ultrasound room at the discretion of the ultrasound technician under special circumstances. We apologize for any inconvenience.   Follow-Up: At [facility], you and your health needs are our priority.  As part of our continuing mission to provide you with exceptional heart care, we have created designated Provider Care Teams.  These Care Teams include your primary Cardiologist (physician) and Advanced Practice Providers (APPs -  Physician Assistants and Nurse Practitioners) who all work together to provide you with the care you need, when you need it.  You will need a follow up appointment in 6 months  Providers on your designated Care  Team:   Lonni Meager, NP Bernardino Bring, PA-C Cadence [place], [place]  COVID-19 Vaccine Information can be found at: [email or web address] For questions related to vaccine distribution or appointments, please email [email or web address] or call [phone].

## 2024-09-11 ENCOUNTER — Other Ambulatory Visit

## 2024-09-12 ENCOUNTER — Ambulatory Visit

## 2024-09-12 ENCOUNTER — Ambulatory Visit: Admitting: Oncology

## 2024-10-01 ENCOUNTER — Other Ambulatory Visit: Payer: Self-pay | Admitting: Family Medicine

## 2024-10-01 DIAGNOSIS — Z1231 Encounter for screening mammogram for malignant neoplasm of breast: Secondary | ICD-10-CM

## 2024-10-12 ENCOUNTER — Encounter: Payer: Self-pay | Admitting: Cardiovascular Disease

## 2024-10-12 DIAGNOSIS — Z79899 Other long term (current) drug therapy: Secondary | ICD-10-CM

## 2024-10-15 MED ORDER — HYDROCHLOROTHIAZIDE 25 MG PO TABS: 25.0000 mg | ORAL_TABLET | Freq: Every day | ORAL | 3 refills | Status: AC

## 2024-10-15 MED ORDER — ISOSORBIDE MONONITRATE ER 60 MG PO TB24: 30.0000 mg | ORAL_TABLET | Freq: Two times a day (BID) | ORAL | 1 refills | Status: AC

## 2024-10-18 ENCOUNTER — Ambulatory Visit: Attending: Cardiovascular Disease

## 2024-10-18 DIAGNOSIS — Z952 Presence of prosthetic heart valve: Secondary | ICD-10-CM | POA: Diagnosis not present

## 2024-10-18 DIAGNOSIS — I25118 Atherosclerotic heart disease of native coronary artery with other forms of angina pectoris: Secondary | ICD-10-CM | POA: Diagnosis not present

## 2024-10-18 LAB — ECHOCARDIOGRAM COMPLETE
AR max vel: 1.59 cm2
AV Area VTI: 1.58 cm2
AV Area mean vel: 1.62 cm2
AV Mean grad: 10 mmHg
AV Peak grad: 17.8 mmHg
Ao pk vel: 2.11 m/s
Area-P 1/2: 4.36 cm2
S' Lateral: 2.82 cm

## 2024-10-26 ENCOUNTER — Ambulatory Visit
Admission: RE | Admit: 2024-10-26 | Discharge: 2024-10-26 | Disposition: A | Discharge status: Home / Self Care | Source: Ambulatory Visit | Attending: Family Medicine | Admitting: Family Medicine

## 2024-10-26 DIAGNOSIS — Z1231 Encounter for screening mammogram for malignant neoplasm of breast: Secondary | ICD-10-CM | POA: Insufficient documentation

## 2024-10-27 ENCOUNTER — Other Ambulatory Visit: Payer: Self-pay | Admitting: Cardiovascular Disease

## 2024-10-27 ENCOUNTER — Ambulatory Visit: Payer: Self-pay | Admitting: Cardiovascular Disease

## 2025-02-19 ENCOUNTER — Encounter (INDEPENDENT_AMBULATORY_CARE_PROVIDER_SITE_OTHER): Payer: Medicare PPO

## 2025-02-19 ENCOUNTER — Ambulatory Visit (INDEPENDENT_AMBULATORY_CARE_PROVIDER_SITE_OTHER): Payer: Medicare PPO | Admitting: Vascular Surgery
